# Patient Record
Sex: Male | Born: 1935
Health system: Southern US, Community
[De-identification: ages and names within clinical notes are randomized; demographics above are authoritative.]

## PROBLEM LIST (undated history)

## (undated) DIAGNOSIS — M5136 Other intervertebral disc degeneration, lumbar region: Secondary | ICD-10-CM

## (undated) DIAGNOSIS — M810 Age-related osteoporosis without current pathological fracture: Secondary | ICD-10-CM

## (undated) DIAGNOSIS — N189 Chronic kidney disease, unspecified: Secondary | ICD-10-CM

## (undated) DIAGNOSIS — I10 Essential (primary) hypertension: Secondary | ICD-10-CM

## (undated) DIAGNOSIS — D649 Anemia, unspecified: Secondary | ICD-10-CM

## (undated) DIAGNOSIS — M869 Osteomyelitis, unspecified: Secondary | ICD-10-CM

## (undated) DIAGNOSIS — I2510 Atherosclerotic heart disease of native coronary artery without angina pectoris: Secondary | ICD-10-CM

## (undated) DIAGNOSIS — D509 Iron deficiency anemia, unspecified: Secondary | ICD-10-CM

## (undated) DIAGNOSIS — M51369 Other intervertebral disc degeneration, lumbar region without mention of lumbar back pain or lower extremity pain: Secondary | ICD-10-CM

## (undated) DIAGNOSIS — J449 Chronic obstructive pulmonary disease, unspecified: Secondary | ICD-10-CM

## (undated) DIAGNOSIS — E213 Hyperparathyroidism, unspecified: Secondary | ICD-10-CM

## (undated) DIAGNOSIS — Z9989 Dependence on other enabling machines and devices: Secondary | ICD-10-CM

## (undated) DIAGNOSIS — E785 Hyperlipidemia, unspecified: Secondary | ICD-10-CM

## (undated) DIAGNOSIS — N400 Enlarged prostate without lower urinary tract symptoms: Secondary | ICD-10-CM

## (undated) DIAGNOSIS — G4733 Obstructive sleep apnea (adult) (pediatric): Secondary | ICD-10-CM

## (undated) DIAGNOSIS — G709 Myoneural disorder, unspecified: Secondary | ICD-10-CM

## (undated) HISTORY — PX: EYE SURGERY: SHX253

## (undated) HISTORY — DX: Chronic kidney disease, unspecified: N18.9

## (undated) HISTORY — PX: VASECTOMY: SHX75

## (undated) HISTORY — DX: Atherosclerotic heart disease of native coronary artery without angina pectoris: I25.10

## (undated) HISTORY — DX: Obstructive sleep apnea (adult) (pediatric): Z99.89

## (undated) HISTORY — DX: Iron deficiency anemia, unspecified: D50.9

## (undated) HISTORY — DX: Obstructive sleep apnea (adult) (pediatric): G47.33

## (undated) HISTORY — DX: Osteomyelitis, unspecified: M86.9

## (undated) HISTORY — DX: Hyperparathyroidism, unspecified: E21.3

## (undated) HISTORY — DX: Hyperlipidemia, unspecified: E78.5

## (undated) HISTORY — DX: Benign prostatic hyperplasia without lower urinary tract symptoms: N40.0

## (undated) HISTORY — DX: Chronic obstructive pulmonary disease, unspecified: J44.9

---

## 2000-12-24 ENCOUNTER — Encounter: Admission: RE | Admit: 2000-12-24 | Discharge: 2000-12-24 | Payer: Self-pay | Admitting: Endocrinology

## 2000-12-24 ENCOUNTER — Encounter: Payer: Self-pay | Admitting: Endocrinology

## 2001-12-13 ENCOUNTER — Emergency Department (HOSPITAL_COMMUNITY): Admission: EM | Admit: 2001-12-13 | Discharge: 2001-12-13 | Payer: Self-pay | Admitting: Emergency Medicine

## 2001-12-13 ENCOUNTER — Encounter: Payer: Self-pay | Admitting: Emergency Medicine

## 2004-11-20 ENCOUNTER — Emergency Department (HOSPITAL_COMMUNITY): Admission: EM | Admit: 2004-11-20 | Discharge: 2004-11-20 | Payer: Self-pay | Admitting: Emergency Medicine

## 2010-01-31 ENCOUNTER — Encounter: Admission: RE | Admit: 2010-01-31 | Discharge: 2010-01-31 | Payer: Self-pay | Admitting: Endocrinology

## 2010-03-01 ENCOUNTER — Encounter: Payer: Self-pay | Admitting: Pulmonary Disease

## 2010-03-16 ENCOUNTER — Ambulatory Visit (HOSPITAL_COMMUNITY): Admission: RE | Admit: 2010-03-16 | Discharge: 2010-03-16 | Payer: Self-pay | Admitting: Cardiology

## 2010-05-19 ENCOUNTER — Ambulatory Visit: Payer: Self-pay | Admitting: Pulmonary Disease

## 2010-05-19 DIAGNOSIS — I10 Essential (primary) hypertension: Secondary | ICD-10-CM | POA: Insufficient documentation

## 2010-05-19 DIAGNOSIS — G4733 Obstructive sleep apnea (adult) (pediatric): Secondary | ICD-10-CM | POA: Insufficient documentation

## 2010-05-28 ENCOUNTER — Ambulatory Visit (HOSPITAL_BASED_OUTPATIENT_CLINIC_OR_DEPARTMENT_OTHER)
Admission: RE | Admit: 2010-05-28 | Discharge: 2010-05-28 | Payer: Self-pay | Source: Home / Self Care | Attending: Pulmonary Disease | Admitting: Pulmonary Disease

## 2010-05-28 ENCOUNTER — Encounter: Payer: Self-pay | Admitting: Pulmonary Disease

## 2010-06-09 ENCOUNTER — Ambulatory Visit: Payer: Self-pay | Admitting: Pulmonary Disease

## 2010-06-13 ENCOUNTER — Telehealth: Payer: Self-pay | Admitting: Pulmonary Disease

## 2010-07-11 ENCOUNTER — Encounter: Payer: Self-pay | Admitting: Pulmonary Disease

## 2010-07-12 ENCOUNTER — Encounter: Payer: Self-pay | Admitting: Pulmonary Disease

## 2010-07-18 NOTE — Assessment & Plan Note (Signed)
Summary: Clayton [name]   Visit Type:  Initial Consult Copy to:  pcp Primary Provider/Referring Provider:  Dr. Elayne Snare  CC:  Snoring.  History of Present Illness: 77/M, hypertensive & diabetic  for evaluation of obstructive sleep apnea  He is an avid biker & has been tandem biking with his wife on a recumbent cycle x many yrs. At home, he sleeps in basement, wife upstairs c/o snoring when in [place], loud , awakens self sometimes, witnessed apneas. c/o dry mouth He sleeps on left side or back, x  1pillow, 2-3 awakenings, BR visits.He reports non refreshing sleep Epworth Sleepiness Score 13.  Preventive Screening-Counseling & Management  Alcohol-Tobacco     Alcohol drinks/day: occ     Smoking Status: quit     Packs/Day: 4.5     Year Quit: 1977   History of Present Illness: snore, wake up, dry mouth, stop breathing  What time do you typically go to bed?(between what hours): 10-11:30pm  How long does it take you to fall asleep? 1-3 months  How many times during the night do you wake up? 2-3  What time do you get out of bed to start your day? 4-6:45am  Do you drive or operate heavy machinery in your occupation? retired  How much has your weight changed (up or down) over the past two years? (in pounds): no  Have you ever had a sleep study before?  If yes,when and where: no  Do you currently use CPAP ? If so , at what pressure? no  Do you wear oxygen at any time? If yes, how many liters per minute? no Current Medications (verified): 1)  Metformin Hcl 500 Mg Tabs (Metformin Hcl) .... Once Daily At [facility] 2)  Glimepiride 2 Mg Tabs (Glimepiride) .... Once Noon 3)  Glimepiride 1 Mg Tabs (Glimepiride) .... Take 1 Tab By Mouth At Bedtime 4)  Indapamide 1.25 Mg Tabs (Indapamide) .... Take 1 Tab By Mouth At Bedtime 5)  Metformin Hcl 1000 Mg Tabs (Metformin Hcl) .... Take 1 Tab By Mouth At Bedtime 6)  Diltiazem Hcl 120 Mg Tabs (Diltiazem Hcl) .... Take 2 Tab By Mouth At Bedtime 7)   Ferrous Sulfate 325 (65 Fe) Mg Tabs (Ferrous Sulfate) .... Take 1 Tab By Mouth At Bedtime 8)  Bifera 28 Mg Tabs (Polysacch Fe Cmp-Fe Heme Poly) .... Take 1 Tab By Mouth At Bedtime 9)  Enablex 7.5 Mg Xr24h-Tab (Darifenacin Hydrobromide) .... Take 1 Tab By Mouth At Bedtime 10)  Finasteride 5 Mg Tabs (Finasteride) .... Take 1 Tab By Mouth At Bedtime 11)  Simvastatin 20 Mg Tabs (Simvastatin) .... Take 1 Tab By Mouth At Bedtime 12)  Actos 45 Mg Tabs (Pioglitazone Hcl) .... Take 1 Tab By Mouth At Bedtime 13)  Humalog 100 Unit/ml Soln (Insulin Lispro (Human)) .... 4 Units Three Times A Day 14)  Multivitamins   Tabs (Multiple Vitamin) .... Take 1 Tablet By Mouth Once A Day 15)  Colon Cleanser  Caps (Misc Natural Products) .... Take 1 Tablet By Mouth Every Morning 16)  Calcium-Vitamin D 600-200 Mg-Unit Tabs (Calcium-Vitamin D) .... Take 1 Tablet By Mouth Every Morning 17)  Acetaminophen 325 Mg Tabs (Acetaminophen) .... Take 1 Tablet By Mouth Every Morning  Allergies (verified): 1)  ! Pcn  Past History:  Social History: Last updated: 05/19/2010 Marital Status: Married, lives with wife Leslie Deschenes Children: Yes, 4 Occupation: Retired Patient states former smoker. (Quit 09-26-75)  Past Medical History: Diabetes SLEEP APNEA (ICD-780.57) HYPERTENSION (ICD-401.9)  Family History: none  Social History: Marital Status: Married, lives with wife Jarmall Santmyer Children: Yes, 4 Occupation: Retired Patient states former smoker. (Quit 09-26-75) Smoking Status:  quit Packs/Day:  4.5 Alcohol drinks/day:  occ  Review of Systems       The patient complains of shortness of breath with activity, loss of appetite, weight change, and hand/feet swelling.  The patient denies shortness of breath at rest, productive cough, non-productive cough, coughing up blood, chest pain, irregular heartbeats, acid heartburn, indigestion, abdominal pain, difficulty swallowing, sore throat, tooth/dental  problems, headaches, nasal congestion/difficulty breathing through nose, sneezing, itching, ear ache, anxiety, depression, joint stiffness or pain, rash, change in color of mucus, and fever.    Vital Signs:  Patient profile:   77 year old male Height:      70.5 inches Weight:      236 pounds BMI:     33.50 O2 Sat:      95 % on Room air Pulse rate:   70 / minute BP sitting:   138 / 68  (left arm) Cuff size:   regular  Vitals Entered By: Iran Planas CMA (May 19, 2010 2:57 PM)  O2 Flow:  Room air CC: Snoring Comments Medications reviewed with patient Verified contact number and pharmacy with patient Iran Planas CMA  May 19, 2010 2:58 PM    Physical Exam  Additional Exam:  Gen. Pleasant, well-nourished, in no distress, normal affect ENT - no lesions, no post nasal drip, class 2 airway Neck: No JVD, no thyromegaly, no carotid bruits Lungs: no use of accessory muscles, no dullness to percussion, clear without rales or rhonchi  Cardiovascular: Rhythm regular, heart sounds  normal, no murmurs or gallops, no peripheral edema Abdomen: soft and non-tender, no hepatosplenomegaly, BS normal. Musculoskeletal: No deformities, no cyanosis or clubbing Neuro:  alert, non focal     Impression & Recommendations:  Problem # 1:  SLEEP APNEA (ICD-780.57) The pathophysiology of obstructive sleep apnea, it's cardiovascular consequences and modes of treatment including CPAP were discussed with the patient in great detail.  For witnessed apneas, non refreshing sleep & loud snoring, PSG will be scheduled.  Orders: Sleep Disorder Referral (Sleep Disorder) Consultation Level III [id number]  Medications Added to Medication List This Visit: 1)  Metformin Hcl 500 Mg Tabs (Metformin hcl) .... Once daily at noon 2)  Glimepiride 2 Mg Tabs (Glimepiride) .... Once noon 3)  Glimepiride 1 Mg Tabs (Glimepiride) .... Take 1 tab by mouth at bedtime 4)  Indapamide 1.25 Mg Tabs (Indapamide) ....  Take 1 tab by mouth at bedtime 5)  Metformin Hcl 1000 Mg Tabs (Metformin hcl) .... Take 1 tab by mouth at bedtime 6)  Diltiazem Hcl 120 Mg Tabs (Diltiazem hcl) .... Take 2 tab by mouth at bedtime 7)  Ferrous Sulfate 325 (65 Fe) Mg Tabs (Ferrous sulfate) .... Take 1 tab by mouth at bedtime 8)  Bifera 28 Mg Tabs (Polysacch fe cmp-fe heme poly) .... Take 1 tab by mouth at bedtime 9)  Enablex 7.5 Mg Xr24h-tab (Darifenacin hydrobromide) .... Take 1 tab by mouth at bedtime 10)  Finasteride 5 Mg Tabs (Finasteride) .... Take 1 tab by mouth at bedtime 11)  Simvastatin 20 Mg Tabs (Simvastatin) .... Take 1 tab by mouth at bedtime 12)  Actos 45 Mg Tabs (Pioglitazone hcl) .... Take 1 tab by mouth at bedtime 13)  Humalog 100 Unit/ml Soln (Insulin lispro (human)) .... 4 units three times a day 14)  Multivitamins Tabs (Multiple  vitamin) .... Take 1 tablet by mouth once a day 15)  Colon Cleanser Caps (Misc natural products) .... Take 1 tablet by mouth every morning 16)  Calcium-vitamin D 600-200 Mg-unit Tabs (Calcium-vitamin d) .... Take 1 tablet by mouth every morning 17)  Acetaminophen 325 Mg Tabs (Acetaminophen) .... Take 1 tablet by mouth every morning  Patient Instructions: 1)  Copy sent to: dr Elayne Snare 2)  Please schedule a follow-up appointment in 2 weeks after sleep study

## 2010-07-18 NOTE — Letter (Signed)
Summary: Elayne Snare MD  Elayne Snare MD   Imported By: Bubba Hales 05/10/2010 07:40:23  _____________________________________________________________________  External Attachment:    Type:   Image     Comment:   External Document

## 2010-07-20 NOTE — Assessment & Plan Note (Addendum)
Summary: Clayton [name]   Visit Type:  Follow-up Copy to:  pcp Primary Provider/Referring Provider:  Dr. Elayne Snare  CC:  Spouse c/o pt snoring, kicking, apneas, and and hitting when sleep .  History of Present Illness: 77/M, hypertensive & diabetic  for evaluation of obstructive sleep apnea  He is an avid biker & has been tandem biking with his wife on a recumbent cycle x many yrs. At home, he sleeps in basement, wife upstairs . Hisotry confirmed from wife c/o snoring loud , awakens self sometimes, witnessed apneas. c/o dry mouth He sleeps on left side or back, x  1pillow, 2-3 awakenings, BR visits.He reports non refreshing sleep Epworth Sleepiness Score 13. PSg showed  Mild obstructive sleep apnea with hypopneas causing sleep  fragmentation and oxygen  desaturations were  especially severe during rapid eye movement sleep, predominantly  rapid eye movement related events.   2. Severe periodic limb movements however, very few of these were  associated with arousals.  The significance of this is unclear and  may be related to the sleep disordered breathing he isleaving for [place] x 2 months  Preventive Screening-Counseling & Management  Alcohol-Tobacco     Alcohol drinks/day: occ     Smoking Status: quit     Packs/Day: 4.5     Year Quit: 1977  Current Medications (verified): 1)  Metformin Hcl 500 Mg Tabs (Metformin Hcl) .... Once Daily At [facility] 2)  Glimepiride 2 Mg Tabs (Glimepiride) .... Once Noon 3)  Glimepiride 1 Mg Tabs (Glimepiride) .... Take 1 Tab By Mouth At Bedtime 4)  Indapamide 1.25 Mg Tabs (Indapamide) .... Take 1 Tab By Mouth At Bedtime 5)  Metformin Hcl 1000 Mg Tabs (Metformin Hcl) .... Take 1 Tab By Mouth At Bedtime 6)  Diltiazem Hcl 120 Mg Tabs (Diltiazem Hcl) .... Take 2 Tab By Mouth At Bedtime 7)  Ferrous Sulfate 325 (65 Fe) Mg Tabs (Ferrous Sulfate) .... Take 1 Tab By Mouth At Bedtime 8)  Bifera 28 Mg Tabs (Polysacch Fe Cmp-Fe Heme Poly) .... Take 1 Tab By Mouth At  Bedtime 9)  Enablex 7.5 Mg Xr24h-Tab (Darifenacin Hydrobromide) .... Take 1 Tab By Mouth At Bedtime 10)  Finasteride 5 Mg Tabs (Finasteride) .... Take 1 Tab By Mouth At Bedtime 11)  Simvastatin 20 Mg Tabs (Simvastatin) .... Take 1 Tab By Mouth At Bedtime 12)  Actos 45 Mg Tabs (Pioglitazone Hcl) .... Take 1 Tab By Mouth At Bedtime 13)  Humalog 100 Unit/ml Soln (Insulin Lispro (Human)) .... 4 Units Three Times A Day 14)  Multivitamins   Tabs (Multiple Vitamin) .... Take 1 Tablet By Mouth Once A Day 15)  Colon Cleanser  Caps (Misc Natural Products) .... Take 1 Tablet By Mouth Every Morning 16)  Calcium-Vitamin D 600-200 Mg-Unit Tabs (Calcium-Vitamin D) .... Take 1 Tablet By Mouth Every Morning 17)  Acetaminophen 325 Mg Tabs (Acetaminophen) .... Take 1 Tablet By Mouth Every Morning  Allergies (verified): 1)  ! Pcn  Past History:  Past Medical History: Last updated: 05/19/2010 Diabetes SLEEP APNEA (ICD-780.57) HYPERTENSION (ICD-401.9)    Social History: Last updated: 05/19/2010 Marital Status: Married, lives with wife Deaunte Rubinstein Children: Yes, 4 Occupation: Retired Patient states former smoker. (Quit 09-26-75)  Review of Systems  The patient denies anorexia, fever, weight loss, weight gain, vision loss, decreased hearing, hoarseness, chest pain, syncope, dyspnea on exertion, peripheral edema, prolonged cough, headaches, hemoptysis, abdominal pain, melena, hematochezia, severe indigestion/heartburn, hematuria, muscle weakness, suspicious skin lesions, difficulty walking, depression, unusual weight  change, abnormal bleeding, enlarged lymph nodes, and angioedema.    Vital Signs:  Patient profile:   77 year old male Height:      70.5 inches Weight:      237.8 pounds BMI:     33.76 O2 Sat:      96 % on Room air Temp:     97.4 degrees F oral Pulse rate:   73 / minute BP sitting:   128 / 60  (left arm) Cuff size:   large  Vitals Entered By: Iran Planas CMA (June 09, 2010 2:07 PM)  O2 Flow:  Room air CC: Spouse c/o pt snoring, kicking, apneas,  and hitting when sleep  Comments Medications reviewed with patient Verified contact number and pharmacy with patient Iran Planas CMA  June 09, 2010 2:08 PM    Physical Exam  Additional Exam:  Gen. Pleasant, well-nourished, in no distress, normal affect ENT - no lesions, no post nasal drip, class 2 airway Neck: No JVD, no thyromegaly, no carotid bruits Lungs: no use of accessory muscles, no dullness to percussion, clear without rales or rhonchi  Cardiovascular: Rhythm regular, heart sounds  normal, no murmurs or gallops, no peripheral edema Musculoskeletal: No deformities, no cyanosis or clubbing      Impression & Recommendations:  Problem # 1:  SLEEP APNEA (ICD-780.57) The pathophysiology of obstructive sleep apnea, it's cardiovascular consequences and modes of treatment including CPAP were discussed with the patient in great detail.  Will treat due to severity of desaturations event hough seems to be REM related predominantly Start autoCPAP 5-12 cm , common issues discussed - he leaves for [place] in a few days Orders: DME Referral (DME) Est. Patient Level III [id number])  Patient Instructions: 1)  Copy sent to: 2)  Please schedule a follow-up appointment in 2 months. 3)  We will set you up with an AUTO cpap machine- send in chip in 1 month 4)  Call us / supply company for problems  Appended Document: Clayton [name] reviewed download  12/27 -07/11/10 >>CPAP working well, no resiudal events on 12 cm avg, good usage, hope he is feelingbetter rested  Appended Document: Clayton [name] lmomtcb x 1  Appended Document: Clayton [name] Pt states he is sleeping longer, not snoring, unable to tell if is well rested. Feels like throat is drying out and is using cough drop before bed and when wakes to use the restroom.

## 2010-07-20 NOTE — Progress Notes (Signed)
Summary: home health-  Phone Note Call from Patient Call back at Home Phone [phone]   Caller: Patient Call For: Dr. Elsworth Soho Summary of Call: Patient phoned he stated that home health is suposed to come out today and they are leaving town tomorrow morning. He wanted to make sure that they were still coming today. Patient can be reached at [phone] or [phone]. He has called them but the give you 3 numbers to call and he isn't sure which one to call. Initial call taken by: Ozella Rocks,  June 13, 2010 10:15 AM  Follow-up for Phone Call        According to order it was faxed on friday to  [facility].  I called [facility] and they have no record of this order. icalled the pt to see if her requested it be sent to another company. He staets no he was told it was sent to [facility] and was told they would be out today to get him setup because he is leaving for [place] for the winter. I spoke to [place] and she is going to call [facility] and see what can be done.  Baileyton Bing CMA  June 13, 2010 12:16 PM  Called and spoke with [place] at [facility]. He will call patient now and work him in today for cpap set up. Order and sleep study faxed to [place] at the [facility] location at 3:30. Called and spoke with pt and he is aware of his appt for cpap set up. Phillips Grout  June 13, 2010 12:59 PM

## 2010-07-24 ENCOUNTER — Telehealth (INDEPENDENT_AMBULATORY_CARE_PROVIDER_SITE_OTHER): Payer: Self-pay | Admitting: *Deleted

## 2010-08-03 NOTE — Progress Notes (Addendum)
Summary: patient phoned stated that he was returning a call  Phone Note Call from Patient   Caller: Patient Call For: ALVA Summary of Call: Patient phoned stated that Dr. Elsworth Soho called and left a message for him to return the call last week. Patient can be reached at [phone] Initial call taken by: Ozella Rocks,  July 24, 2010 10:06 AM  Follow-up for Phone Call        Spoke with pt and advised of RA's recs per append regarding CPAP compliance.  Pt verbalized understanding and states that he is starting to feel somewhat better rested during the day.  Will call if has any issues.  Follow-up by: Tilden Dome,  July 24, 2010 11:10 AM     Appended Document: patient phoned stated that he was returning a call download 1/26-2/24/12 >> good usage, no residuals on avg 12 cm

## 2010-08-03 NOTE — Letter (Signed)
Summary: CMN for CPAP Supplies/[facility]  CMN for CPAP Supplies/[facility]   Imported By: Phillis Knack 07/25/2010 07:29:14  _____________________________________________________________________  External Attachment:    Type:   Image     Comment:   External Document

## 2010-08-18 ENCOUNTER — Encounter: Payer: Self-pay | Admitting: Pulmonary Disease

## 2010-08-29 NOTE — Letter (Signed)
Summary: Therapeutic History Report  Therapeutic History Report   Imported By: Jamelle Haring 08/25/2010 09:03:20  _____________________________________________________________________  External Attachment:    Type:   Image     Comment:   External Document

## 2010-09-01 ENCOUNTER — Encounter: Payer: Self-pay | Admitting: Pulmonary Disease

## 2010-09-01 ENCOUNTER — Ambulatory Visit (INDEPENDENT_AMBULATORY_CARE_PROVIDER_SITE_OTHER): Payer: Medicare Other | Admitting: Pulmonary Disease

## 2010-09-01 DIAGNOSIS — G4730 Sleep apnea, unspecified: Secondary | ICD-10-CM

## 2010-09-05 NOTE — Assessment & Plan Note (Signed)
Summary: OV IN MARCH//SH   Visit Type:  Follow-up Copy to:  pcp Primary Provider/Referring Provider:  Dr. Elayne Snare  CC:  Pt states is us8ng machine approx 5 hours a night.  History of Present Illness: 77/M, hypertensive & diabetic  for FU of obstructive sleep apnea  He is an avid biker & has been tandem biking with his wife on a recumbent cycle x many yrs. At home, he sleeps in basement, wife upstairs . Hisotry confirmed from wife c/o snoring loud , awakens self sometimes, witnessed apneas. c/o dry mouth He sleeps on left side or back, x  1pillow, 2-3 awakenings, BR visits.He reports non refreshing sleep Epworth Sleepiness Score 13. PSg showed  Mild obstructive sleep apnea with hypopneas causing sleep  fragmentation and oxygen  desaturations were  especially severe during rapid eye movement sleep, predominantly  rapid eye movement related events.   2. Severe periodic limb movements however, very few of these were  associated with arousals.  The significance of this is unclear and  may be related to the sleep disordered breathing  September 01, 2010   download  12/27 -07/11/10 >>CPAP working well, no resiudal events on 12 cm avg, good usage, reviewed download upto feb - controlled on 12 cm c/o dry mouth, pressure ok  Preventive Screening-Counseling & Management  Alcohol-Tobacco     Alcohol drinks/day: occ     Smoking Status: quit     Packs/Day: 4.5     Year Quit: 1977  Current Medications (verified): 1)  Metformin Hcl 500 Mg Tabs (Metformin Hcl) .... Once Daily At [facility] 2)  Glimepiride 2 Mg Tabs (Glimepiride) .... Once Noon 3)  Glimepiride 1 Mg Tabs (Glimepiride) .... Take 1 Tab By Mouth At Bedtime 4)  Indapamide 1.25 Mg Tabs (Indapamide) .... Take 1 Tab By Mouth At Bedtime 5)  Metformin Hcl 1000 Mg Tabs (Metformin Hcl) .... Take 1 Tab By Mouth At Bedtime 6)  Diltiazem Hcl 120 Mg Tabs (Diltiazem Hcl) .... Take 3 Tab By Mouth At Bedtime 7)  Ferrous Sulfate 325 (65 Fe) Mg Tabs  (Ferrous Sulfate) .... Take 1 Tab By Mouth Every Morning 8)  Bifera 28 Mg Tabs (Polysacch Fe Cmp-Fe Heme Poly) .... Take 1 Tab By Mouth in The Morning 9)  Enablex 7.5 Mg Xr24h-Tab (Darifenacin Hydrobromide) .... Take 1 Tab By Mouth At Bedtime 10)  Finasteride 5 Mg Tabs (Finasteride) .... Take 1 Tab By Mouth At Bedtime 11)  Simvastatin 20 Mg Tabs (Simvastatin) .... Take 1 Tab By Mouth At Bedtime 12)  Actos 45 Mg Tabs (Pioglitazone Hcl) .... Take One Tablet By Mouth Every Other Day 13)  Humalog 100 Unit/ml Soln (Insulin Lispro (Human)) .... 7 Units Three Times A Day (Unless Going To Gym Will Take 4 Units in The Mornings) 14)  Multivitamins   Tabs (Multiple Vitamin) .... Take 1 Tablet By Mouth Once A Day 15)  Colon Cleanser  Caps (Misc Natural Products) .... Take 1 Tablet By Mouth Every Morning and 3 Every Night 16)  Calcium-Vitamin D 600-200 Mg-Unit Tabs (Calcium-Vitamin D) .... Take 1 Tablet By Mouth Every Morning 17)  Acetaminophen 325 Mg Tabs (Acetaminophen) .... Take 2 Tablet By Mouth Two Times A Day  Allergies (verified): 1)  ! Pcn  Past History:  Past Medical History: Last updated: 05/19/2010 Diabetes SLEEP APNEA (ICD-780.57) HYPERTENSION (ICD-401.9)    Social History: Last updated: 05/19/2010 Marital Status: Married, lives with wife Temur Schu Children: Yes, 4 Occupation: Retired Patient states former smoker. (Quit 09-26-75)  Review of Systems  The patient denies anorexia, fever, weight loss, weight gain, vision loss, decreased hearing, hoarseness, chest pain, syncope, dyspnea on exertion, peripheral edema, prolonged cough, headaches, hemoptysis, abdominal pain, melena, hematochezia, severe indigestion/heartburn, muscle weakness, suspicious skin lesions, transient blindness, difficulty walking, depression, unusual weight change, abnormal bleeding, and enlarged lymph nodes.    Vital Signs:  Patient profile:   77 year old male Height:      70.5 inches Weight:       233.2 pounds BMI:     33.11 O2 Sat:      92 % on Room air Temp:     97.6 degrees F oral Pulse rate:   68 / minute BP sitting:   122 / 50  (left arm) Cuff size:   regular  Vitals Entered By: Iran Planas CMA (September 01, 2010 1:43 PM)  O2 Flow:  Room air CC: Pt states is us8ng machine approx 5 hours a night Comments Medications reviewed with patient Verified contact number and pharmacy with patient Iran Planas CMA  September 01, 2010 1:43 PM    Physical Exam  Additional Exam:  Gen. Pleasant, well-nourished, in no distress, normal affect ENT - no lesions, no post nasal drip, class 2 airway Neck: No JVD, no thyromegaly, no carotid bruits Lungs: no use of accessory muscles, no dullness to percussion, clear without rales or rhonchi  Cardiovascular: Rhythm regular, heart sounds  normal, no murmurs or gallops, no peripheral edema Musculoskeletal: No deformities, no cyanosis or clubbing      Impression & Recommendations:  Problem # 1:  SLEEP APNEA (ICD-780.57) change to fixed pressure 12 cm & rechk download on this setting Compliance encouraged, wt loss emphasized, asked to avoid meds with sedative side effects, cautioned against driving when sleepy.  He seems to have adjusted well. Orders: Est. Patient Level III [id number]) DME Referral (DME)  Medications Added to Medication List This Visit: 1)  Diltiazem Hcl 120 Mg Tabs (Diltiazem hcl) .... Take 3 tab by mouth at bedtime 2)  Ferrous Sulfate 325 (65 Fe) Mg Tabs (Ferrous sulfate) .... Take 1 tab by mouth every morning 3)  Bifera 28 Mg Tabs (Polysacch fe cmp-fe heme poly) .... Take 1 tab by mouth in the morning 4)  Actos 45 Mg Tabs (Pioglitazone hcl) .... Take one tablet by mouth every other day 5)  Humalog 100 Unit/ml Soln (Insulin lispro (human)) .... 7 units three times a day (unless going to gym will take 4 units in the mornings) 6)  Colon Cleanser Caps (Misc natural products) .... Take 1 tablet by mouth every morning and 3  every night 7)  Acetaminophen 325 Mg Tabs (Acetaminophen) .... Take 2 tablet by mouth two times a day  Patient Instructions: 1)  Copy sent to: 2)  Please schedule a follow-up appointment in 3 months. 3)  We will change to fixed pressure of 12 cm & chk another donwload 4)  You are doing well on CPAP

## 2010-09-13 ENCOUNTER — Telehealth: Payer: Self-pay | Admitting: Pulmonary Disease

## 2010-09-13 DIAGNOSIS — G4730 Sleep apnea, unspecified: Secondary | ICD-10-CM

## 2010-09-13 NOTE — Telephone Encounter (Signed)
Called, spoke with pt.  States he tried the nasal pillow last night but didn't like it.  States he put the mask back on before the end of the night.  Also, states pressure at 12 is too high.  Requesting Dr. Bari Mantis recs.  Pls advise.  Thanks!

## 2010-09-15 NOTE — Telephone Encounter (Signed)
Called and advised pt he can stay on the mask and he states he already went back to the mask. Pt also aware order sent to [facility] to have pt set on auto mode. Pt verbalized understanding  Will forward to [facility] so they are aware

## 2010-09-15 NOTE — Telephone Encounter (Signed)
Order given to [facility] to put pt back on auto cpap

## 2010-09-15 NOTE — Telephone Encounter (Signed)
Ok to stay on mask OK to go back to auto 8-12 cm as before - pl send order to [facility]

## 2011-08-28 ENCOUNTER — Encounter: Payer: Self-pay | Admitting: Pulmonary Disease

## 2011-12-19 ENCOUNTER — Other Ambulatory Visit: Payer: Self-pay | Admitting: Endocrinology

## 2011-12-19 MED ORDER — FERUMOXYTOL INJECTION 510 MG/17 ML: 510.0000 mg | Freq: Once | INTRAVENOUS | Status: DC

## 2011-12-27 ENCOUNTER — Encounter (HOSPITAL_COMMUNITY): Payer: Self-pay

## 2011-12-27 ENCOUNTER — Encounter (HOSPITAL_COMMUNITY)
Admission: RE | Admit: 2011-12-27 | Discharge: 2011-12-27 | Disposition: A | Discharge status: Home / Self Care | Payer: Medicare Other | Source: Ambulatory Visit | Attending: Endocrinology | Admitting: Endocrinology

## 2011-12-27 DIAGNOSIS — D509 Iron deficiency anemia, unspecified: Secondary | ICD-10-CM | POA: Insufficient documentation

## 2011-12-27 HISTORY — DX: Age-related osteoporosis without current pathological fracture: M81.0

## 2011-12-27 HISTORY — DX: Essential (primary) hypertension: I10

## 2011-12-27 HISTORY — DX: Anemia, unspecified: D64.9

## 2011-12-27 HISTORY — DX: Myoneural disorder, unspecified: G70.9

## 2011-12-27 HISTORY — DX: Chronic kidney disease, unspecified: N18.9

## 2011-12-27 MED ORDER — FERUMOXYTOL INJECTION 510 MG/17 ML
INTRAVENOUS | Status: AC
  Administered 2011-12-27: 510 mg via INTRAVENOUS
  Filled 2011-12-27: qty 17

## 2011-12-27 MED ORDER — FERUMOXYTOL INJECTION 510 MG/17 ML
510.0000 mg | Freq: Once | INTRAVENOUS | Status: AC
  Administered 2011-12-27: 510 mg via INTRAVENOUS

## 2011-12-27 MED ORDER — SODIUM CHLORIDE 0.9 % IV SOLN
INTRAVENOUS | Status: AC
  Administered 2011-12-27: 13:00:00 via INTRAVENOUS

## 2012-06-18 ENCOUNTER — Encounter (HOSPITAL_BASED_OUTPATIENT_CLINIC_OR_DEPARTMENT_OTHER): Payer: Self-pay | Admitting: *Deleted

## 2012-06-18 ENCOUNTER — Inpatient Hospital Stay (HOSPITAL_BASED_OUTPATIENT_CLINIC_OR_DEPARTMENT_OTHER)
Admission: EM | Admit: 2012-06-18 | Discharge: 2012-07-02 | DRG: 207 | Disposition: A | Discharge status: Skilled Nursing Facility | Payer: Medicare PPO | Attending: Internal Medicine | Admitting: Internal Medicine

## 2012-06-18 ENCOUNTER — Emergency Department (HOSPITAL_BASED_OUTPATIENT_CLINIC_OR_DEPARTMENT_OTHER): Payer: Medicare PPO

## 2012-06-18 DIAGNOSIS — Z87891 Personal history of nicotine dependence: Secondary | ICD-10-CM

## 2012-06-18 DIAGNOSIS — D539 Nutritional anemia, unspecified: Secondary | ICD-10-CM | POA: Diagnosis present

## 2012-06-18 DIAGNOSIS — R5381 Other malaise: Secondary | ICD-10-CM

## 2012-06-18 DIAGNOSIS — G9340 Encephalopathy, unspecified: Secondary | ICD-10-CM | POA: Diagnosis not present

## 2012-06-18 DIAGNOSIS — M810 Age-related osteoporosis without current pathological fracture: Secondary | ICD-10-CM | POA: Diagnosis present

## 2012-06-18 DIAGNOSIS — E785 Hyperlipidemia, unspecified: Secondary | ICD-10-CM | POA: Diagnosis present

## 2012-06-18 DIAGNOSIS — J40 Bronchitis, not specified as acute or chronic: Secondary | ICD-10-CM

## 2012-06-18 DIAGNOSIS — N400 Enlarged prostate without lower urinary tract symptoms: Secondary | ICD-10-CM | POA: Diagnosis present

## 2012-06-18 DIAGNOSIS — R197 Diarrhea, unspecified: Secondary | ICD-10-CM | POA: Diagnosis not present

## 2012-06-18 DIAGNOSIS — N183 Chronic kidney disease, stage 3 unspecified: Secondary | ICD-10-CM | POA: Diagnosis present

## 2012-06-18 DIAGNOSIS — Z23 Encounter for immunization: Secondary | ICD-10-CM

## 2012-06-18 DIAGNOSIS — M5137 Other intervertebral disc degeneration, lumbosacral region: Secondary | ICD-10-CM | POA: Diagnosis present

## 2012-06-18 DIAGNOSIS — Z79899 Other long term (current) drug therapy: Secondary | ICD-10-CM

## 2012-06-18 DIAGNOSIS — Z794 Long term (current) use of insulin: Secondary | ICD-10-CM

## 2012-06-18 DIAGNOSIS — G4730 Sleep apnea, unspecified: Secondary | ICD-10-CM

## 2012-06-18 DIAGNOSIS — Q619 Cystic kidney disease, unspecified: Secondary | ICD-10-CM

## 2012-06-18 DIAGNOSIS — R05 Cough: Secondary | ICD-10-CM

## 2012-06-18 DIAGNOSIS — E119 Type 2 diabetes mellitus without complications: Secondary | ICD-10-CM

## 2012-06-18 DIAGNOSIS — R0902 Hypoxemia: Secondary | ICD-10-CM

## 2012-06-18 DIAGNOSIS — J9600 Acute respiratory failure, unspecified whether with hypoxia or hypercapnia: Secondary | ICD-10-CM

## 2012-06-18 DIAGNOSIS — H9190 Unspecified hearing loss, unspecified ear: Secondary | ICD-10-CM

## 2012-06-18 DIAGNOSIS — I129 Hypertensive chronic kidney disease with stage 1 through stage 4 chronic kidney disease, or unspecified chronic kidney disease: Secondary | ICD-10-CM | POA: Diagnosis present

## 2012-06-18 DIAGNOSIS — M51379 Other intervertebral disc degeneration, lumbosacral region without mention of lumbar back pain or lower extremity pain: Secondary | ICD-10-CM | POA: Diagnosis present

## 2012-06-18 DIAGNOSIS — J1000 Influenza due to other identified influenza virus with unspecified type of pneumonia: Principal | ICD-10-CM

## 2012-06-18 DIAGNOSIS — C3490 Malignant neoplasm of unspecified part of unspecified bronchus or lung: Secondary | ICD-10-CM

## 2012-06-18 DIAGNOSIS — D72829 Elevated white blood cell count, unspecified: Secondary | ICD-10-CM | POA: Diagnosis present

## 2012-06-18 DIAGNOSIS — F411 Generalized anxiety disorder: Secondary | ICD-10-CM | POA: Diagnosis not present

## 2012-06-18 DIAGNOSIS — N189 Chronic kidney disease, unspecified: Secondary | ICD-10-CM

## 2012-06-18 DIAGNOSIS — E876 Hypokalemia: Secondary | ICD-10-CM | POA: Diagnosis not present

## 2012-06-18 DIAGNOSIS — J9601 Acute respiratory failure with hypoxia: Secondary | ICD-10-CM

## 2012-06-18 DIAGNOSIS — E871 Hypo-osmolality and hyponatremia: Secondary | ICD-10-CM | POA: Diagnosis not present

## 2012-06-18 DIAGNOSIS — R059 Cough, unspecified: Secondary | ICD-10-CM

## 2012-06-18 DIAGNOSIS — J15212 Pneumonia due to Methicillin resistant Staphylococcus aureus: Secondary | ICD-10-CM | POA: Diagnosis not present

## 2012-06-18 DIAGNOSIS — M940 Chondrocostal junction syndrome [Tietze]: Secondary | ICD-10-CM | POA: Diagnosis present

## 2012-06-18 DIAGNOSIS — I10 Essential (primary) hypertension: Secondary | ICD-10-CM

## 2012-06-18 HISTORY — DX: Other intervertebral disc degeneration, lumbar region without mention of lumbar back pain or lower extremity pain: M51.369

## 2012-06-18 HISTORY — DX: Other intervertebral disc degeneration, lumbar region: M51.36

## 2012-06-18 NOTE — ED Notes (Signed)
Cough since Sat. Low grade fever. +nausea. Decreased appetite. Took insulin. FSBS 249 at 9p

## 2012-06-18 NOTE — ED Notes (Signed)
Patient transported to X-ray 

## 2012-06-19 ENCOUNTER — Encounter (HOSPITAL_COMMUNITY): Payer: Self-pay | Admitting: *Deleted

## 2012-06-19 ENCOUNTER — Emergency Department (HOSPITAL_BASED_OUTPATIENT_CLINIC_OR_DEPARTMENT_OTHER): Payer: Medicare PPO

## 2012-06-19 DIAGNOSIS — R059 Cough, unspecified: Secondary | ICD-10-CM

## 2012-06-19 DIAGNOSIS — H9190 Unspecified hearing loss, unspecified ear: Secondary | ICD-10-CM | POA: Diagnosis present

## 2012-06-19 DIAGNOSIS — J40 Bronchitis, not specified as acute or chronic: Secondary | ICD-10-CM

## 2012-06-19 DIAGNOSIS — R05 Cough: Secondary | ICD-10-CM

## 2012-06-19 DIAGNOSIS — R0902 Hypoxemia: Secondary | ICD-10-CM | POA: Diagnosis present

## 2012-06-19 DIAGNOSIS — N183 Chronic kidney disease, stage 3 unspecified: Secondary | ICD-10-CM | POA: Diagnosis present

## 2012-06-19 DIAGNOSIS — E119 Type 2 diabetes mellitus without complications: Secondary | ICD-10-CM | POA: Diagnosis present

## 2012-06-19 LAB — CBC
HCT: 36.7 % — ABNORMAL LOW (ref 39.0–52.0)
MCH: 33.7 pg (ref 26.0–34.0)
MCHC: 33 g/dL (ref 30.0–36.0)
MCV: 102.2 fL — ABNORMAL HIGH (ref 78.0–100.0)
Platelets: 229 10*3/uL (ref 150–400)
RDW: 12.5 % (ref 11.5–15.5)

## 2012-06-19 LAB — CBC WITH DIFFERENTIAL/PLATELET
Eosinophils Absolute: 0 10*3/uL (ref 0.0–0.7)
Eosinophils Relative: 0 % (ref 0–5)
Hemoglobin: 11.7 g/dL — ABNORMAL LOW (ref 13.0–17.0)
Lymphocytes Relative: 6 % — ABNORMAL LOW (ref 12–46)
Lymphs Abs: 0.8 10*3/uL (ref 0.7–4.0)
MCH: 32.6 pg (ref 26.0–34.0)
MCV: 100.6 fL — ABNORMAL HIGH (ref 78.0–100.0)
Monocytes Relative: 9 % (ref 3–12)
Neutrophils Relative %: 86 % — ABNORMAL HIGH (ref 43–77)
RBC: 3.59 MIL/uL — ABNORMAL LOW (ref 4.22–5.81)
WBC: 13.8 10*3/uL — ABNORMAL HIGH (ref 4.0–10.5)

## 2012-06-19 LAB — D-DIMER, QUANTITATIVE
D-Dimer, Quant: 0.66 ug/mL-FEU — ABNORMAL HIGH (ref 0.00–0.48)
D-Dimer, Quant: 1.01 ug/mL-FEU — ABNORMAL HIGH (ref 0.00–0.48)

## 2012-06-19 LAB — GLUCOSE, CAPILLARY
Glucose-Capillary: 266 mg/dL — ABNORMAL HIGH (ref 70–99)
Glucose-Capillary: 340 mg/dL — ABNORMAL HIGH (ref 70–99)
Glucose-Capillary: 217 mg/dL — ABNORMAL HIGH (ref 70–99)
Glucose-Capillary: 222 mg/dL — ABNORMAL HIGH (ref 70–99)

## 2012-06-19 LAB — BASIC METABOLIC PANEL
BUN: 31 mg/dL — ABNORMAL HIGH (ref 6–23)
CO2: 26 mEq/L (ref 19–32)
CO2: 26 mEq/L (ref 19–32)
Calcium: 9.4 mg/dL (ref 8.4–10.5)
Chloride: 97 mEq/L (ref 96–112)
Creatinine, Ser: 1.6 mg/dL — ABNORMAL HIGH (ref 0.50–1.35)
Glucose, Bld: 276 mg/dL — ABNORMAL HIGH (ref 70–99)
Glucose, Bld: 362 mg/dL — ABNORMAL HIGH (ref 70–99)
Potassium: 3.6 mEq/L (ref 3.5–5.1)
Sodium: 137 mEq/L (ref 135–145)

## 2012-06-19 LAB — INFLUENZA PANEL BY PCR (TYPE A & B)
Influenza A By PCR: POSITIVE — AB
Influenza B By PCR: NEGATIVE

## 2012-06-19 LAB — DIFFERENTIAL
Basophils Absolute: 0 10*3/uL (ref 0.0–0.1)
Eosinophils Absolute: 0.2 10*3/uL (ref 0.0–0.7)
Eosinophils Relative: 2 % (ref 0–5)
Monocytes Absolute: 1.8 10*3/uL — ABNORMAL HIGH (ref 0.1–1.0)

## 2012-06-19 LAB — PRO B NATRIURETIC PEPTIDE: Pro B Natriuretic peptide (BNP): 1178 pg/mL — ABNORMAL HIGH (ref 0–450)

## 2012-06-19 MED ORDER — IPRATROPIUM BROMIDE 0.02 % IN SOLN
0.5000 mg | Freq: Once | RESPIRATORY_TRACT | Status: AC
  Administered 2012-06-19: 0.5 mg via RESPIRATORY_TRACT
  Filled 2012-06-19: qty 2.5

## 2012-06-19 MED ORDER — ENOXAPARIN SODIUM 40 MG/0.4ML ~~LOC~~ SOLN
40.0000 mg | SUBCUTANEOUS | Status: DC
  Filled 2012-06-19 (×3): qty 0.4

## 2012-06-19 MED ORDER — ALBUTEROL SULFATE (5 MG/ML) 0.5% IN NEBU
5.0000 mg | INHALATION_SOLUTION | Freq: Once | RESPIRATORY_TRACT | Status: AC
  Administered 2012-06-19: 5 mg via RESPIRATORY_TRACT
  Filled 2012-06-19 (×2): qty 0.5

## 2012-06-19 MED ORDER — SODIUM CHLORIDE 0.9 % IV SOLN
INTRAVENOUS | Status: DC
  Administered 2012-06-19: 75 mL/h via INTRAVENOUS
  Administered 2012-06-20: 1000 mL via INTRAVENOUS

## 2012-06-19 MED ORDER — LEVOFLOXACIN IN D5W 500 MG/100ML IV SOLN
500.0000 mg | Freq: Once | INTRAVENOUS | Status: AC
  Administered 2012-06-19: 500 mg via INTRAVENOUS
  Filled 2012-06-19: qty 100

## 2012-06-19 MED ORDER — SODIUM CHLORIDE 0.9 % IV BOLUS (SEPSIS)
500.0000 mL | Freq: Once | INTRAVENOUS | Status: AC
  Administered 2012-06-19: 500 mL via INTRAVENOUS

## 2012-06-19 MED ORDER — ONDANSETRON HCL 4 MG/2ML IJ SOLN: 4.0000 mg | Freq: Four times a day (QID) | INTRAMUSCULAR | Status: DC | PRN

## 2012-06-19 MED ORDER — IPRATROPIUM BROMIDE 0.02 % IN SOLN
0.5000 mg | RESPIRATORY_TRACT | Status: DC | PRN
  Administered 2012-06-19 – 2012-06-20 (×4): 0.5 mg via RESPIRATORY_TRACT
  Filled 2012-06-19 (×4): qty 2.5

## 2012-06-19 MED ORDER — ALUM & MAG HYDROXIDE-SIMETH 200-200-20 MG/5ML PO SUSP
30.0000 mL | Freq: Four times a day (QID) | ORAL | Status: DC | PRN
  Filled 2012-06-19: qty 30

## 2012-06-19 MED ORDER — FUROSEMIDE 10 MG/ML IJ SOLN
40.0000 mg | Freq: Once | INTRAMUSCULAR | Status: AC
  Administered 2012-06-19: 40 mg via INTRAVENOUS
  Filled 2012-06-19: qty 4

## 2012-06-19 MED ORDER — DILTIAZEM HCL ER BEADS 240 MG PO CP24
360.0000 mg | ORAL_CAPSULE | Freq: Every day | ORAL | Status: DC
  Administered 2012-06-19: 360 mg via ORAL
  Filled 2012-06-19 (×2): qty 1

## 2012-06-19 MED ORDER — INSULIN GLARGINE 100 UNIT/ML ~~LOC~~ SOLN
15.0000 [IU] | Freq: Every day | SUBCUTANEOUS | Status: DC
  Administered 2012-06-19: 15 [IU] via SUBCUTANEOUS

## 2012-06-19 MED ORDER — ACETAMINOPHEN 500 MG PO TABS
1000.0000 mg | ORAL_TABLET | Freq: Once | ORAL | Status: AC
  Administered 2012-06-19: 1000 mg via ORAL
  Filled 2012-06-19: qty 2

## 2012-06-19 MED ORDER — FINASTERIDE 5 MG PO TABS
5.0000 mg | ORAL_TABLET | Freq: Every day | ORAL | Status: DC
  Administered 2012-06-19: 5 mg via ORAL
  Filled 2012-06-19 (×2): qty 1

## 2012-06-19 MED ORDER — SENNOSIDES-DOCUSATE SODIUM 8.6-50 MG PO TABS
1.0000 | ORAL_TABLET | Freq: Every evening | ORAL | Status: DC | PRN
  Filled 2012-06-19: qty 1

## 2012-06-19 MED ORDER — AZITHROMYCIN 500 MG PO TABS
500.0000 mg | ORAL_TABLET | Freq: Every day | ORAL | Status: AC
  Administered 2012-06-19: 500 mg via ORAL
  Filled 2012-06-19: qty 1

## 2012-06-19 MED ORDER — ALENDRONATE SODIUM 70 MG PO TABS: 70.0000 mg | ORAL_TABLET | ORAL | Status: DC

## 2012-06-19 MED ORDER — AZITHROMYCIN 250 MG PO TABS
250.0000 mg | ORAL_TABLET | Freq: Every day | ORAL | Status: DC
  Filled 2012-06-19: qty 1

## 2012-06-19 MED ORDER — SODIUM CHLORIDE 0.9 % IJ SOLN
3.0000 mL | Freq: Two times a day (BID) | INTRAMUSCULAR | Status: DC
  Administered 2012-06-19 – 2012-06-20 (×3): 3 mL via INTRAVENOUS
  Administered 2012-06-21: 9 mL via INTRAVENOUS
  Administered 2012-06-21 – 2012-07-01 (×18): 3 mL via INTRAVENOUS

## 2012-06-19 MED ORDER — HYDROCODONE-ACETAMINOPHEN 5-325 MG PO TABS
1.0000 | ORAL_TABLET | ORAL | Status: DC | PRN
  Administered 2012-06-19 – 2012-06-20 (×4): 2 via ORAL
  Filled 2012-06-19 (×4): qty 2

## 2012-06-19 MED ORDER — FESOTERODINE FUMARATE ER 8 MG PO TB24: 8.0000 mg | ORAL_TABLET | Freq: Every day | ORAL | Status: DC

## 2012-06-19 MED ORDER — FENTANYL CITRATE 0.05 MG/ML IJ SOLN
50.0000 ug | Freq: Once | INTRAMUSCULAR | Status: AC
  Administered 2012-06-19: 50 ug via INTRAVENOUS
  Filled 2012-06-19: qty 2

## 2012-06-19 MED ORDER — ONDANSETRON HCL 4 MG PO TABS: 4.0000 mg | ORAL_TABLET | Freq: Four times a day (QID) | ORAL | Status: DC | PRN

## 2012-06-19 MED ORDER — IOHEXOL 350 MG/ML SOLN
80.0000 mL | Freq: Once | INTRAVENOUS | Status: AC | PRN
  Administered 2012-06-19: 80 mL via INTRAVENOUS

## 2012-06-19 MED ORDER — OSELTAMIVIR PHOSPHATE 75 MG PO CAPS
75.0000 mg | ORAL_CAPSULE | Freq: Two times a day (BID) | ORAL | Status: DC
  Administered 2012-06-19 (×2): 75 mg via ORAL
  Filled 2012-06-19 (×4): qty 1

## 2012-06-19 MED ORDER — BENZONATATE 100 MG PO CAPS
100.0000 mg | ORAL_CAPSULE | Freq: Three times a day (TID) | ORAL | Status: DC | PRN
  Filled 2012-06-19: qty 1

## 2012-06-19 MED ORDER — PNEUMOCOCCAL VAC POLYVALENT 25 MCG/0.5ML IJ INJ
0.5000 mL | INJECTION | INTRAMUSCULAR | Status: AC
  Administered 2012-06-20: 0.5 mL via INTRAMUSCULAR
  Filled 2012-06-19: qty 0.5

## 2012-06-19 MED ORDER — PIOGLITAZONE HCL 45 MG PO TABS
45.0000 mg | ORAL_TABLET | ORAL | Status: DC
  Administered 2012-06-19: 45 mg via ORAL
  Filled 2012-06-19 (×2): qty 1

## 2012-06-19 MED ORDER — ACETAMINOPHEN 650 MG RE SUPP: 650.0000 mg | Freq: Four times a day (QID) | RECTAL | Status: DC | PRN

## 2012-06-19 MED ORDER — INSULIN ASPART 100 UNIT/ML ~~LOC~~ SOLN
0.0000 [IU] | Freq: Every day | SUBCUTANEOUS | Status: DC
  Administered 2012-06-19: 2 [IU] via SUBCUTANEOUS

## 2012-06-19 MED ORDER — INDAPAMIDE 1.25 MG PO TABS
1.2500 mg | ORAL_TABLET | Freq: Every day | ORAL | Status: DC
  Administered 2012-06-19: 1.25 mg via ORAL
  Filled 2012-06-19 (×2): qty 1

## 2012-06-19 MED ORDER — SIMVASTATIN 20 MG PO TABS
20.0000 mg | ORAL_TABLET | Freq: Every day | ORAL | Status: DC
  Administered 2012-06-19: 20 mg via ORAL
  Filled 2012-06-19 (×2): qty 1

## 2012-06-19 MED ORDER — ZOLPIDEM TARTRATE 5 MG PO TABS
5.0000 mg | ORAL_TABLET | Freq: Every evening | ORAL | Status: DC | PRN
  Filled 2012-06-19: qty 1

## 2012-06-19 MED ORDER — ALBUTEROL SULFATE (5 MG/ML) 0.5% IN NEBU
2.5000 mg | INHALATION_SOLUTION | RESPIRATORY_TRACT | Status: DC | PRN
  Administered 2012-06-19 – 2012-06-20 (×4): 2.5 mg via RESPIRATORY_TRACT
  Filled 2012-06-19 (×4): qty 0.5

## 2012-06-19 MED ORDER — BIOTENE DRY MOUTH MT LIQD
15.0000 mL | Freq: Two times a day (BID) | OROMUCOSAL | Status: DC
  Administered 2012-06-19 (×2): 15 mL via OROMUCOSAL

## 2012-06-19 MED ORDER — ACETAMINOPHEN 325 MG PO TABS
650.0000 mg | ORAL_TABLET | Freq: Four times a day (QID) | ORAL | Status: DC | PRN
  Administered 2012-06-19: 650 mg via ORAL
  Filled 2012-06-19: qty 2

## 2012-06-19 MED ORDER — INSULIN ASPART 100 UNIT/ML ~~LOC~~ SOLN
0.0000 [IU] | Freq: Three times a day (TID) | SUBCUTANEOUS | Status: DC
  Administered 2012-06-19: 5 [IU] via SUBCUTANEOUS
  Administered 2012-06-19 (×2): 11 [IU] via SUBCUTANEOUS

## 2012-06-19 MED ORDER — LABETALOL HCL 5 MG/ML IV SOLN
5.0000 mg | INTRAVENOUS | Status: DC | PRN
  Administered 2012-06-21 – 2012-06-22 (×6): 5 mg via INTRAVENOUS
  Filled 2012-06-19 (×5): qty 4

## 2012-06-19 MED ORDER — GLIMEPIRIDE 4 MG PO TABS
4.0000 mg | ORAL_TABLET | Freq: Two times a day (BID) | ORAL | Status: DC
  Administered 2012-06-19 (×2): 4 mg via ORAL
  Filled 2012-06-19 (×5): qty 1

## 2012-06-19 NOTE — Progress Notes (Signed)
PHARMACIST - PHYSICIAN COMMUNICATION  CONCERNING: P&T Medication Policy Regarding Oral Bisphosphonates  RECOMMENDATION: Your order for alendronate (Fosamax), ibandronate (Boniva), or risedronate (Actonel) has been discontinued at this time.  If the patient's post-hospital medical condition warrants safe use of this class of drugs, please resume the pre-hospital regimen upon discharge.  DESCRIPTION:  Alendronate (Fosamax), ibandronate (Boniva), and risedronate (Actonel) can cause severe esophageal erosions in patients who are unable to remain upright at least 30 minutes after taking this medication.   Since brief interruptions in therapy are thought to have minimal impact on bone mineral density, the [place] has established that bisphosphonate orders should be routinely discontinued during hospitalization.   To override this safety policy and permit administration of Boniva, Fosamax, or Actonel in the hospital, prescribers must write "DO NOT HOLD" in the comments section when placing the order for this class of medications.  Alycia Rossetti, PharmD, BCPS Clinical Pharmacist 06/19/2012 6:00 AM

## 2012-06-19 NOTE — H&P (Signed)
PCP:   Elayne Snare   Chief Complaint:  Shortness of breath and chest pains  HPI: This is a 79 year old gentleman who drove to [place] and back with his daughters over the holiday, they returned home on the 29th. While in [place] he had positive sick contacts. Back home he has been coughing severely. It is a dry hacking cough. He's  had some nausea with the cough but no posttussive emesis. He's developed marked upper torso and generalized body aches. Today he went to dinner by his daughters, he felt feverish. He went home to lay down and then to call them stating he could not lay down because of the body aches and pain and severity of the coughing. He was wheezing and very raspy. He was short of breath. They took him to [facility] where he was evaluated and found to be somewhat hypoxic. The hospitalist here were called and requested to admit. During my interview patient states he already feels much better. History provided by the patient but also by his 2 daughters who are present at the bedside.   Review of Systems:  The patient denies anorexia, fever, weight loss,, vision loss, decreased hearing, hoarseness, syncope, dyspnea on exertion, peripheral edema, balance deficits, hemoptysis, abdominal pain, melena, hematochezia, severe indigestion/heartburn, hematuria, incontinence, genital sores, muscle weakness, suspicious skin lesions, transient blindness, difficulty walking, depression, unusual weight change, abnormal bleeding, enlarged lymph nodes, angioedema, and breast masses.  Past Medical History: Past Medical History  Diagnosis Date  . Diabetes mellitus   . Hypertension   . Anemia   . Chronic renal insufficiency   . Osteoporosis   . Neuromuscular disorder    Past Surgical History  Procedure Date  . Vasectomy   . Eye surgery     Medications: Prior to Admission medications   Medication Sig Start Date End Date Taking? Authorizing Provider  Acetaminophen (TYLENOL  ARTHRITIS PAIN PO) Take 2 tablets by mouth 2 (two) times daily.   Yes Historical Provider, MD  acetaminophen (TYLENOL) 325 MG tablet Take 650 mg by mouth every 6 (six) hours as needed.   Yes Historical Provider, MD  alendronate (FOSAMAX) 70 MG tablet Take 70 mg by mouth every 7 (seven) days. Take with a full glass of water on an empty stomach.   Yes Historical Provider, MD  diltiazem (TIAZAC) 360 MG 24 hr capsule Take 360 mg by mouth daily.   Yes Historical Provider, MD  fesoterodine (TOVIAZ) 8 MG TB24 Take 8 mg by mouth daily.   Yes Historical Provider, MD  finasteride (PROSCAR) 5 MG tablet Take 5 mg by mouth daily.   Yes Historical Provider, MD  furosemide (LASIX) 20 MG tablet Take 20 mg by mouth daily.   Yes Historical Provider, MD  glimepiride (AMARYL) 2 MG tablet Take 4 mg by mouth 2 (two) times daily.    Yes Historical Provider, MD  indapamide (LOZOL) 1.25 MG tablet Take 1.25 mg by mouth every morning.   Yes Historical Provider, MD  insulin glargine (LANTUS) 100 UNIT/ML injection Inject 15 Units into the skin at bedtime.   Yes Historical Provider, MD  insulin lispro (HUMALOG) 100 UNIT/ML injection Inject 15 Units into the skin 3 (three) times daily before meals.    Yes Historical Provider, MD  metFORMIN (GLUCOPHAGE) 500 MG tablet Take 500 mg by mouth daily with supper.   Yes Historical Provider, MD  pioglitazone (ACTOS) 45 MG tablet Take 45 mg by mouth every other day.   Yes Historical Provider,  MD  Polysacch Fe Cmp-Fe Heme Poly (BIFERA) 28 MG TABS Take by mouth.   Yes Historical Provider, MD  Pseudoephedrine-APAP-DM (DAYQUIL PO) Take by mouth.   Yes Historical Provider, MD  simvastatin (ZOCOR) 20 MG tablet Take 20 mg by mouth every evening.   Yes Historical Provider, MD  zolpidem (AMBIEN) 5 MG tablet Take 5 mg by mouth at bedtime as needed.   Yes Historical Provider, MD  ferumoxytol (FERAHEME) 510 MG/17ML SOLN Inject 17 mLs (510 mg total) into the vein once. 12/19/11   Elayne Snare, MD     Allergies:   Allergies  Allergen Reactions  . Penicillins     Social History:  reports that he quit smoking about 39 years ago. He quit smokeless tobacco use about 36 years ago. He reports that he does not drink alcohol or use illicit drugs.  Family History: Diabetes mellitus, hypertension  Physical Exam: Filed Vitals:   06/19/12 0142 06/19/12 0338 06/19/12 0345 06/19/12 0449  BP:  169/55  170/63  Pulse:  99  103  Temp:  98.4 F (36.9 C)  98.9 F (37.2 C)  TempSrc:      Resp:  29 21 24   Height:    5\' 11"  (1.803 m)  Weight:    109.7 kg (241 lb 13.5 oz)  SpO2: 94% 95%  93%    General:  Alert and oriented times three, well developed and nourished, no acute distress Eyes: PERRLA, pink conjunctiva, no scleral icterus ENT: Moist oral mucosa, neck supple, no thyromegaly Lungs: clear to ascultation, no wheeze, no crackles, no use of accessory muscles Cardiovascular: regular rate and rhythm, no regurgitation, no gallops, no murmurs. No carotid bruits, no JVD Abdomen: soft, positive BS, non-tender, non-distended, no organomegaly, not an acute abdomen GU: not examined Neuro: CN II - XII grossly intact, sensation intact Musculoskeletal: strength 5/5 all extremities, no clubbing, cyanosis or edema, some reproducible chest wall tenderness Skin: no rash, no subcutaneous crepitation, no decubitus Psych: appropriate patient   Labs on Admission:   Basename 06/19/12 0030  NA 137  K 3.6  CL 97  CO2 26  GLUCOSE 276*  BUN 31*  CREATININE 1.60*  CALCIUM 9.4  MG --  PHOS --   No results found for this basename: AST:2,ALT:2,ALKPHOS:2,BILITOT:2,PROT:2,ALBUMIN:2 in the last 72 hours No results found for this basename: LIPASE:2,AMYLASE:2 in the last 72 hours  Basename 06/19/12 0030  WBC 13.4*  NEUTROABS 10.9*  HGB 12.1*  HCT 36.7*  MCV 102.2*  PLT 229    Basename 06/19/12 0030  CKTOTAL --  CKMB --  CKMBINDEX --  TROPONINI <0.30   No components found with this  basename: POCBNP:3  Basename 06/19/12 0030  DDIMER 0.66*   No results found for this basename: HGBA1C:2 in the last 72 hours No results found for this basename: CHOL:2,HDL:2,LDLCALC:2,TRIG:2,CHOLHDL:2,LDLDIRECT:2 in the last 72 hours No results found for this basename: TSH,T4TOTAL,FREET3,T3FREE,THYROIDAB in the last 72 hours No results found for this basename: VITAMINB12:2,FOLATE:2,FERRITIN:2,TIBC:2,IRON:2,RETICCTPCT:2 in the last 72 hours  Micro Results: No results found for this or any previous visit (from the past 240 hour(s)).   Radiological Exams on Admission: Dg Chest 2 View  06/18/2012  *RADIOLOGY REPORT*  Clinical Data: Cough, congestion, shortness of breath, fever.  CHEST - 2 VIEW  Comparison: 03/12/2012  Findings: Lingular / left basilar density again noted, likely scarring.  No confluent opacity on the right.  Heart is upper limits normal in size.  No effusions.  No acute bony abnormality. Degenerative changes in the  thoracic spine.  IMPRESSION: Stable lingular / left basilar density, likely scarring.  No acute findings.   Original Report Authenticated By: Rolm Baptise, M.D.    Ct Angio Chest Pe W/cm &/or Wo Cm  06/19/2012  *RADIOLOGY REPORT*  Clinical Data: Shortness of breath and cough; congestion.  Fever. Elevated D-dimer.  CT ANGIOGRAPHY CHEST  Technique:  Multidetector CT imaging of the chest using the standard protocol during bolus administration of intravenous contrast. Multiplanar reconstructed images including MIPs were obtained and reviewed to evaluate the vascular anatomy.  Contrast: 2mL OMNIPAQUE IOHEXOL 350 MG/ML SOLN  Comparison: Chest radiograph performed 06/18/2012  Findings: There is no evidence of central pulmonary embolus; evaluation for pulmonary embolus is suboptimal due to motion artifact.  Minimal bibasilar atelectasis is noted.  A small lymph node is seen along the right minor fissure.  There is no evidence of significant focal consolidation, pleural effusion or  pneumothorax.  A likely 4 mm nodule is noted within the left lower lobe (image 59 of 94).  The mediastinum is unremarkable in appearance.  No mediastinal lymphadenopathy is seen.  No pericardial effusion is identified. The great vessels are grossly unremarkable in appearance.  Diffuse coronary artery calcifications are seen.  Soft tissue density at the posterior mediastinum is nonspecific and may reflect motion artifact; there is also unusual soft tissue density tracking about the bronchioles bilaterally.  No axillary lymphadenopathy is seen.  The visualized portions of the thyroid gland are unremarkable in appearance.  Nonspecific 2.2 cm soft tissue density is noted adjacent to the distal esophagus; the esophagus contains a small amount of fluid, likely transient in nature.  A calcified granuloma is noted within the left hepatic lobe.  The visualized portions of the liver and spleen are otherwise unremarkable.  No acute osseous abnormalities are seen.  IMPRESSION:  1.  No evidence of central pulmonary embolus. 2.  Minimal bibasilar atelectasis noted; lungs otherwise clear. 3.  Likely 4 mm nodule noted within the left lower lobe.  If the patient is at high risk for bronchogenic carcinoma, follow-up chest CT at 1 year is recommended.  If the patient is at low risk, no follow-up is needed.  This recommendation follows the consensus statement: Guidelines for Management of Small Pulmonary Nodules Detected on CT Scans:  A Statement from the [place] as published in Radiology 2005; 237:395-400. 4.  Diffuse coronary artery calcifications seen. 5.  Soft tissue density at the posterior mediastinum is nonspecific and may reflect motion artifact; unusual significant soft tissue density noted tracking about the bronchioles bilaterally, thought to reflect chronic lung disease.  No definite mediastinal lymphadenopathy seen. 6.  Nonspecific soft tissue density noted adjacent to the distal esophagus, possibly reflecting  scarring.   Original Report Authenticated By: Santa Lighter, M.D.     Assessment/Plan Present on Admission:  Bronchitis/costochondritis  Hypoxia Bring in for 23 hour observation Duo nebs, Tessalon Perles as needed  Oxygen to keep sats greater than 88%  rapid flu ordered, however, patient is outside of 48 hour window  Abnormal CT chest Family aware that repeat CT needed in 6 months or year to rule out growth of nodule/malignancy Diabetes  . HYPERTENSION Chronic kidney disease Hard of hearing Stable resume home medication ADA diet and sliding scale insulin  Full code DVT prophylaxis  Teressa Mcglocklin 06/19/2012, 6:44 AM

## 2012-06-19 NOTE — ED Notes (Signed)
I took CBG and got 266 mg./dcltr.

## 2012-06-19 NOTE — Progress Notes (Signed)
Pt requesting breathing treatment at this time; RT paged.

## 2012-06-19 NOTE — ED Provider Notes (Addendum)
History     CSN: [id number]  Arrival date & time 06/18/12  2151   First MD Initiated Contact with Patient 06/18/12 2355      Chief Complaint  Patient presents with  . Cough    (Consider location/radiation/quality/duration/timing/severity/associated sxs/prior treatment) Patient is a 79 y.o. male presenting with cough. The history is provided by the patient and a relative.  Cough This is a new problem. The current episode started more than 2 days ago. The problem occurs constantly. The problem has not changed since onset.The cough is non-productive. Maximum temperature: un measured. Associated symptoms include ear congestion. Pertinent negatives include no sweats and no myalgias. Associated symptoms comments: congestion. He has tried nothing for the symptoms. The treatment provided no relief. He is not a smoker. His past medical history does not include pneumonia.    Past Medical History  Diagnosis Date  . Diabetes mellitus   . Hypertension   . Anemia   . Chronic renal insufficiency   . Osteoporosis   . Neuromuscular disorder     Past Surgical History  Procedure Date  . Eye surgery   . Vasectomy     History reviewed. No pertinent family history.  History  Substance Use Topics  . Smoking status: Not on file  . Smokeless tobacco: Former Systems developer    Quit date: 09/26/1975  . Alcohol Use: No      Review of Systems  Constitutional: Positive for fever.  HENT: Negative for neck pain and neck stiffness.   Respiratory: Positive for cough.   Musculoskeletal: Negative for myalgias.  All other systems reviewed and are negative.    Allergies  Penicillins  Home Medications   Current Outpatient Rx  Name  Route  Sig  Dispense  Refill  . TYLENOL ARTHRITIS PAIN PO   Oral   Take 2 tablets by mouth 2 (two) times daily.         . ACETAMINOPHEN 325 MG PO TABS   Oral   Take 650 mg by mouth every 6 (six) hours as needed.         . ALENDRONATE SODIUM 70 MG PO TABS   Oral   Take 70 mg by mouth every 7 (seven) days. Take with a full glass of water on an empty stomach.         Marland Kitchen DILTIAZEM HCL ER BEADS 360 MG PO CP24   Oral   Take 360 mg by mouth daily.         . FESOTERODINE FUMARATE ER 8 MG PO TB24   Oral   Take 8 mg by mouth daily.         Marland Kitchen FINASTERIDE 5 MG PO TABS   Oral   Take 5 mg by mouth daily.         . FUROSEMIDE 20 MG PO TABS   Oral   Take 20 mg by mouth daily.         Marland Kitchen GLIMEPIRIDE 2 MG PO TABS   Oral   Take 4 mg by mouth 2 (two) times daily.          . INDAPAMIDE 1.25 MG PO TABS   Oral   Take 1.25 mg by mouth every morning.         . INSULIN GLARGINE 100 UNIT/ML [place] SOLN   Subcutaneous   Inject 15 Units into the skin at bedtime.         . INSULIN LISPRO (HUMAN) 100 UNIT/ML [place] SOLN   Subcutaneous  Inject 15 Units into the skin 3 (three) times daily before meals.          Marland Kitchen METFORMIN HCL 500 MG PO TABS   Oral   Take 500 mg by mouth daily with supper.         Marland Kitchen PIOGLITAZONE HCL 45 MG PO TABS   Oral   Take 45 mg by mouth every other day.         Marland Kitchen BIFERA 28 MG PO TABS   Oral   Take by mouth.         . DAYQUIL PO   Oral   Take by mouth.         Marland Kitchen SIMVASTATIN 20 MG PO TABS   Oral   Take 20 mg by mouth every evening.         Marland Kitchen ZOLPIDEM TARTRATE 5 MG PO TABS   Oral   Take 5 mg by mouth at bedtime as needed.         . FERUMOXYTOL INJECTION 510 MG/17 ML   Intravenous   Inject 17 mLs (510 mg total) into the vein once.   15.08 mL   1     BP 165/58  Pulse 102  Temp 98.6 F (37 C) (Oral)  Resp 22  Ht 5\' 11"  (1.803 m)  Wt 245 lb (111.131 kg)  BMI 34.17 kg/m2  SpO2 94%  Physical Exam  Constitutional: He is oriented to person, place, and time. He appears well-developed and well-nourished. No distress.  HENT:  Head: Normocephalic and atraumatic.  Mouth/Throat: Oropharynx is clear and moist.  Eyes: Conjunctivae normal are normal. Pupils are equal, round, and reactive to light.    Neck: Normal range of motion. Neck supple.  Cardiovascular: Normal rate, regular rhythm and intact distal pulses.   Pulmonary/Chest: Effort normal and breath sounds normal. No stridor. No respiratory distress. He has no wheezes. He has no rales.  Abdominal: Soft. Bowel sounds are normal. There is no tenderness. There is no rebound and no guarding.  Musculoskeletal: Normal range of motion. He exhibits no tenderness.  Lymphadenopathy:    He has no cervical adenopathy.  Neurological: He is alert and oriented to person, place, and time.  Skin: Skin is warm and dry. He is not diaphoretic.  Psychiatric: He has a normal mood and affect.    ED Course  Procedures (including critical care time)  Labs Reviewed  GLUCOSE, CAPILLARY - Abnormal; Notable for the following:    Glucose-Capillary 247 (*)     All other components within normal limits  CBC WITH DIFFERENTIAL  BASIC METABOLIC PANEL  D-DIMER, QUANTITATIVE  TROPONIN I   Dg Chest 2 View  06/18/2012  *RADIOLOGY REPORT*  Clinical Data: Cough, congestion, shortness of breath, fever.  CHEST - 2 VIEW  Comparison: 03/12/2012  Findings: Lingular / left basilar density again noted, likely scarring.  No confluent opacity on the right.  Heart is upper limits normal in size.  No effusions.  No acute bony abnormality. Degenerative changes in the thoracic spine.  IMPRESSION: Stable lingular / left basilar density, likely scarring.  No acute findings.   Original Report Authenticated By: Rolm Baptise, M.D.    Ct Angio Chest Pe W/cm &/or Wo Cm  06/19/2012  *RADIOLOGY REPORT*  Clinical Data: Shortness of breath and cough; congestion.  Fever. Elevated D-dimer.  CT ANGIOGRAPHY CHEST  Technique:  Multidetector CT imaging of the chest using the standard protocol during bolus administration of intravenous contrast. Multiplanar reconstructed images including MIPs  were obtained and reviewed to evaluate the vascular anatomy.  Contrast: 63mL OMNIPAQUE IOHEXOL 350 MG/ML SOLN   Comparison: Chest radiograph performed 06/18/2012  Findings: There is no evidence of central pulmonary embolus; evaluation for pulmonary embolus is suboptimal due to motion artifact.  Minimal bibasilar atelectasis is noted.  A small lymph node is seen along the right minor fissure.  There is no evidence of significant focal consolidation, pleural effusion or pneumothorax.  A likely 4 mm nodule is noted within the left lower lobe (image 59 of 94).  The mediastinum is unremarkable in appearance.  No mediastinal lymphadenopathy is seen.  No pericardial effusion is identified. The great vessels are grossly unremarkable in appearance.  Diffuse coronary artery calcifications are seen.  Soft tissue density at the posterior mediastinum is nonspecific and may reflect motion artifact; there is also unusual soft tissue density tracking about the bronchioles bilaterally.  No axillary lymphadenopathy is seen.  The visualized portions of the thyroid gland are unremarkable in appearance.  Nonspecific 2.2 cm soft tissue density is noted adjacent to the distal esophagus; the esophagus contains a small amount of fluid, likely transient in nature.  A calcified granuloma is noted within the left hepatic lobe.  The visualized portions of the liver and spleen are otherwise unremarkable.  No acute osseous abnormalities are seen.  IMPRESSION:  1.  No evidence of central pulmonary embolus. 2.  Minimal bibasilar atelectasis noted; lungs otherwise clear. 3.  Likely 4 mm nodule noted within the left lower lobe.  If the patient is at high risk for bronchogenic carcinoma, follow-up chest CT at 1 year is recommended.  If the patient is at low risk, no follow-up is needed.  This recommendation follows the consensus statement: Guidelines for Management of Small Pulmonary Nodules Detected on CT Scans:  A Statement from the [place] as published in Radiology 2005; 237:395-400. 4.  Diffuse coronary artery calcifications seen. 5.  Soft  tissue density at the posterior mediastinum is nonspecific and may reflect motion artifact; unusual significant soft tissue density noted tracking about the bronchioles bilaterally, thought to reflect chronic lung disease.  No definite mediastinal lymphadenopathy seen. 6.  Nonspecific soft tissue density noted adjacent to the distal esophagus, possibly reflecting scarring.   Original Report Authenticated By: Santa Lighter, M.D.      No diagnosis found.    MDM   Date: 06/19/2012  Rate: 92  Rhythm: normal sinus rhythm  QRS Axis: normal  Intervals: PR prolonged  ST/T Wave abnormalities: normal  Conduction Disutrbances:first-degree A-V block   Narrative Interpretation:   Old EKG Reviewed: unchanged   Patient O2 sat low with h/o of recent trip to [place] via care ddimer ordered and was positive.  Initally clear on exam O2 sats began declining prior to angio and patient evaluated and had rales R> L base.  No CHF seen on CT scan.    Suspect patient developing pulmonary infections.  Patient and family also informed of need to have Dr. Dwyane Dee schedule patient for outpatient chest CT scan to evaluate nodule seen on angio.  Patient and family verbalize understanding and agree to follow up      Jamier Urbas K Sebastin Perlmutter-Rasch, MD 06/19/12 [id number]  Carlisle Beers, MD 06/19/12 [phone]

## 2012-06-19 NOTE — Progress Notes (Signed)
Inpatient Diabetes Program Recommendations  AACE/ADA: New Consensus Statement on Inpatient Glycemic Control (2013)  Target Ranges:  Prepandial:   less than 140 mg/dL      Peak postprandial:   less than 180 mg/dL (1-2 hours)      Critically ill patients:  140 - 180 mg/dL   Inpatient Diabetes Program Recommendations Correction (SSI): Increase to RESISTANT scale per Glycemic Control Order-set Thank you  Raoul Pitch [facility] Inpatient Diabetes Coordinator [phone]

## 2012-06-19 NOTE — ED Notes (Signed)
Patient reports that he is unable to lie in bed; patient is seated in the chair.  Family at bedside.

## 2012-06-19 NOTE — Progress Notes (Signed)
Patient seen and examined. Admitted overnight with cough, fever and body aches. Feels worse today. CXR and CT Chest are not indicative of infection. He does have a wet cough, altho no crackles on lung exam. I will go ahead and empirically start him on a z-pak and tamiflu as my suspicion for the flu is quite high. Will continue to follow. Anticipate DC home in 1-2 days.  Domingo Mend, MD [facility] Pager: [phone]

## 2012-06-20 ENCOUNTER — Encounter (HOSPITAL_COMMUNITY): Payer: Self-pay | Admitting: Internal Medicine

## 2012-06-20 ENCOUNTER — Inpatient Hospital Stay (HOSPITAL_COMMUNITY): Payer: Medicare PPO

## 2012-06-20 ENCOUNTER — Observation Stay (HOSPITAL_COMMUNITY): Payer: Medicare PPO

## 2012-06-20 DIAGNOSIS — J40 Bronchitis, not specified as acute or chronic: Secondary | ICD-10-CM

## 2012-06-20 DIAGNOSIS — R079 Chest pain, unspecified: Secondary | ICD-10-CM

## 2012-06-20 DIAGNOSIS — N189 Chronic kidney disease, unspecified: Secondary | ICD-10-CM

## 2012-06-20 DIAGNOSIS — J9600 Acute respiratory failure, unspecified whether with hypoxia or hypercapnia: Secondary | ICD-10-CM

## 2012-06-20 DIAGNOSIS — R0902 Hypoxemia: Secondary | ICD-10-CM

## 2012-06-20 DIAGNOSIS — E119 Type 2 diabetes mellitus without complications: Secondary | ICD-10-CM

## 2012-06-20 DIAGNOSIS — J1000 Influenza due to other identified influenza virus with unspecified type of pneumonia: Secondary | ICD-10-CM | POA: Diagnosis present

## 2012-06-20 LAB — URINALYSIS, ROUTINE W REFLEX MICROSCOPIC
Ketones, ur: NEGATIVE mg/dL
Nitrite: NEGATIVE
Protein, ur: 100 mg/dL — AB

## 2012-06-20 LAB — POCT I-STAT 3, ART BLOOD GAS (G3+)
Acid-Base Excess: 1 mmol/L (ref 0.0–2.0)
Acid-Base Excess: 1 mmol/L (ref 0.0–2.0)
Acid-base deficit: 3 mmol/L — ABNORMAL HIGH (ref 0.0–2.0)
Bicarbonate: 26.6 mEq/L — ABNORMAL HIGH (ref 20.0–24.0)
O2 Saturation: 100 %
O2 Saturation: 98 %
Patient temperature: 98
Patient temperature: 98.7
TCO2: 28 mmol/L (ref 0–100)
pO2, Arterial: 413 mmHg — ABNORMAL HIGH (ref 80.0–100.0)
pO2, Arterial: 99 mmHg (ref 80.0–100.0)
pO2, Arterial: 108 mmHg — ABNORMAL HIGH (ref 80.0–100.0)

## 2012-06-20 LAB — IRON AND TIBC
Saturation Ratios: 4 % — ABNORMAL LOW (ref 20–55)
TIBC: 256 ug/dL (ref 215–435)

## 2012-06-20 LAB — HEMOGLOBIN A1C: Hgb A1c MFr Bld: 7.4 % — ABNORMAL HIGH (ref <5.7)

## 2012-06-20 LAB — STREP PNEUMONIAE URINARY ANTIGEN: Strep Pneumo Urinary Antigen: NEGATIVE

## 2012-06-20 LAB — FOLATE: Folate: 18.2 ng/mL

## 2012-06-20 LAB — CBC WITH DIFFERENTIAL/PLATELET
Basophils Relative: 0 % (ref 0–1)
Eosinophils Relative: 0 % (ref 0–5)
Hemoglobin: 11.2 g/dL — ABNORMAL LOW (ref 13.0–17.0)
Lymphs Abs: 0.4 10*3/uL — ABNORMAL LOW (ref 0.7–4.0)
MCH: 33 pg (ref 26.0–34.0)
MCV: 100.9 fL — ABNORMAL HIGH (ref 78.0–100.0)
Monocytes Absolute: 1.8 10*3/uL — ABNORMAL HIGH (ref 0.1–1.0)
Platelets: 221 10*3/uL (ref 150–400)
RBC: 3.39 MIL/uL — ABNORMAL LOW (ref 4.22–5.81)

## 2012-06-20 LAB — RENAL FUNCTION PANEL
BUN: 45 mg/dL — ABNORMAL HIGH (ref 6–23)
Chloride: 92 mEq/L — ABNORMAL LOW (ref 96–112)
Glucose, Bld: 313 mg/dL — ABNORMAL HIGH (ref 70–99)
Potassium: 4.9 mEq/L (ref 3.5–5.1)

## 2012-06-20 LAB — GLUCOSE, CAPILLARY

## 2012-06-20 LAB — POCT I-STAT 3, VENOUS BLOOD GAS (G3P V)
Bicarbonate: 19.2 mEq/L — ABNORMAL LOW (ref 20.0–24.0)
Patient temperature: 98.9
pH, Ven: 7.238 — ABNORMAL LOW (ref 7.250–7.300)

## 2012-06-20 LAB — VITAMIN B12: Vitamin B-12: 1074 pg/mL — ABNORMAL HIGH (ref 211–911)

## 2012-06-20 LAB — URINE MICROSCOPIC-ADD ON

## 2012-06-20 MED ORDER — OSELTAMIVIR PHOSPHATE 6 MG/ML PO SUSR
150.0000 mg | Freq: Two times a day (BID) | ORAL | Status: DC
  Filled 2012-06-20 (×2): qty 25

## 2012-06-20 MED ORDER — FUROSEMIDE 10 MG/ML IJ SOLN
40.0000 mg | Freq: Once | INTRAMUSCULAR | Status: AC
  Administered 2012-06-20: 40 mg via INTRAVENOUS
  Filled 2012-06-20: qty 4

## 2012-06-20 MED ORDER — HEPARIN SODIUM (PORCINE) 5000 UNIT/ML IJ SOLN
5000.0000 [IU] | Freq: Three times a day (TID) | INTRAMUSCULAR | Status: DC
  Administered 2012-06-20 – 2012-07-02 (×36): 5000 [IU] via SUBCUTANEOUS
  Filled 2012-06-20 (×41): qty 1

## 2012-06-20 MED ORDER — SODIUM CHLORIDE 0.9 % IV BOLUS (SEPSIS)
1000.0000 mL | Freq: Once | INTRAVENOUS | Status: AC
  Administered 2012-06-20: 1000 mL via INTRAVENOUS

## 2012-06-20 MED ORDER — IPRATROPIUM BROMIDE 0.02 % IN SOLN: 0.5000 mg | RESPIRATORY_TRACT | Status: DC | PRN

## 2012-06-20 MED ORDER — OSELTAMIVIR PHOSPHATE 6 MG/ML PO SUSR
150.0000 mg | Freq: Every day | ORAL | Status: DC
  Administered 2012-06-20 – 2012-06-22 (×3): 150 mg
  Filled 2012-06-20 (×3): qty 25

## 2012-06-20 MED ORDER — FENTANYL CITRATE 0.05 MG/ML IJ SOLN
INTRAMUSCULAR | Status: AC
  Administered 2012-06-20: 200 ug via INTRAVENOUS
  Filled 2012-06-20: qty 4

## 2012-06-20 MED ORDER — FAMOTIDINE IN NACL 20-0.9 MG/50ML-% IV SOLN
20.0000 mg | INTRAVENOUS | Status: DC
  Administered 2012-06-21 – 2012-06-27 (×7): 20 mg via INTRAVENOUS
  Filled 2012-06-20 (×7): qty 50

## 2012-06-20 MED ORDER — VANCOMYCIN HCL 10 G IV SOLR
1500.0000 mg | INTRAVENOUS | Status: DC
  Administered 2012-06-21 – 2012-06-23 (×3): 1500 mg via INTRAVENOUS
  Filled 2012-06-20 (×3): qty 1500

## 2012-06-20 MED ORDER — BIOTENE DRY MOUTH MT LIQD
15.0000 mL | Freq: Four times a day (QID) | OROMUCOSAL | Status: DC
  Administered 2012-06-20 – 2012-06-27 (×30): 15 mL via OROMUCOSAL

## 2012-06-20 MED ORDER — CHLORHEXIDINE GLUCONATE CLOTH 2 % EX PADS
6.0000 | MEDICATED_PAD | Freq: Every day | CUTANEOUS | Status: AC
  Administered 2012-06-20 – 2012-06-24 (×5): 6 via TOPICAL

## 2012-06-20 MED ORDER — SODIUM CHLORIDE 0.9 % IV SOLN
INTRAVENOUS | Status: DC
  Administered 2012-06-20 – 2012-06-22 (×3): via INTRAVENOUS

## 2012-06-20 MED ORDER — LEVOFLOXACIN IN D5W 750 MG/150ML IV SOLN
750.0000 mg | INTRAVENOUS | Status: DC
  Administered 2012-06-20 – 2012-06-22 (×2): 750 mg via INTRAVENOUS
  Filled 2012-06-20 (×2): qty 150

## 2012-06-20 MED ORDER — MIDAZOLAM HCL 2 MG/2ML IJ SOLN
1.0000 mg | INTRAMUSCULAR | Status: DC | PRN
  Administered 2012-06-20 – 2012-06-21 (×4): 2 mg via INTRAVENOUS
  Administered 2012-06-21: 1 mg via INTRAVENOUS
  Administered 2012-06-22 – 2012-06-25 (×13): 2 mg via INTRAVENOUS
  Filled 2012-06-20 (×21): qty 2

## 2012-06-20 MED ORDER — LORAZEPAM 2 MG/ML IJ SOLN
2.0000 mg | Freq: Once | INTRAMUSCULAR | Status: AC
  Administered 2012-06-20: 2 mg via INTRAVENOUS

## 2012-06-20 MED ORDER — LORAZEPAM 2 MG/ML IJ SOLN
0.5000 mg | Freq: Once | INTRAMUSCULAR | Status: AC
  Administered 2012-06-20: 0.5 mg via INTRAVENOUS
  Filled 2012-06-20: qty 1

## 2012-06-20 MED ORDER — LORAZEPAM 2 MG/ML IJ SOLN
INTRAMUSCULAR | Status: AC
  Administered 2012-06-20: 2 mg via INTRAVENOUS
  Filled 2012-06-20: qty 3

## 2012-06-20 MED ORDER — OSELTAMIVIR PHOSPHATE 6 MG/ML PO SUSR
75.0000 mg | Freq: Two times a day (BID) | ORAL | Status: DC
  Filled 2012-06-20 (×2): qty 12.5

## 2012-06-20 MED ORDER — NOREPINEPHRINE BITARTRATE 1 MG/ML IJ SOLN
2.0000 ug/min | INTRAVENOUS | Status: DC
  Filled 2012-06-20: qty 16

## 2012-06-20 MED ORDER — FENTANYL CITRATE 0.05 MG/ML IJ SOLN
200.0000 ug | Freq: Once | INTRAMUSCULAR | Status: AC
  Administered 2012-06-20: 200 ug via INTRAVENOUS

## 2012-06-20 MED ORDER — FENTANYL CITRATE 0.05 MG/ML IJ SOLN
25.0000 ug | INTRAMUSCULAR | Status: DC | PRN
  Administered 2012-06-20 – 2012-06-27 (×27): 50 ug via INTRAVENOUS
  Filled 2012-06-20 (×32): qty 2

## 2012-06-20 MED ORDER — OXEPA PO LIQD
1000.0000 mL | ORAL | Status: DC
  Administered 2012-06-20 – 2012-06-22 (×3): 1000 mL
  Filled 2012-06-20 (×8): qty 1000

## 2012-06-20 MED ORDER — LORAZEPAM 2 MG/ML IJ SOLN
0.5000 mg | Freq: Four times a day (QID) | INTRAMUSCULAR | Status: DC | PRN
  Administered 2012-06-22 – 2012-06-27 (×4): 0.5 mg via INTRAVENOUS
  Filled 2012-06-20 (×4): qty 1

## 2012-06-20 MED ORDER — PRO-STAT SUGAR FREE PO LIQD
60.0000 mL | Freq: Three times a day (TID) | ORAL | Status: DC
  Administered 2012-06-20 – 2012-06-23 (×10): 60 mL
  Filled 2012-06-20 (×12): qty 60

## 2012-06-20 MED ORDER — MUPIROCIN 2 % EX OINT
1.0000 "application " | TOPICAL_OINTMENT | Freq: Two times a day (BID) | CUTANEOUS | Status: AC
  Administered 2012-06-20 – 2012-06-24 (×10): 1 via NASAL
  Filled 2012-06-20: qty 22

## 2012-06-20 MED ORDER — SODIUM CHLORIDE 0.9 % IV SOLN
INTRAVENOUS | Status: AC
  Administered 2012-06-20: 2.9 [IU]/h via INTRAVENOUS
  Administered 2012-06-21: 4 [IU]/h via INTRAVENOUS
  Filled 2012-06-20 (×2): qty 1

## 2012-06-20 MED ORDER — DEXTROSE 5 % IV SOLN
1.0000 g | INTRAVENOUS | Status: DC
  Administered 2012-06-20 – 2012-06-22 (×3): 1 g via INTRAVENOUS
  Filled 2012-06-20 (×4): qty 10

## 2012-06-20 MED ORDER — ALBUTEROL SULFATE (5 MG/ML) 0.5% IN NEBU
2.5000 mg | INHALATION_SOLUTION | RESPIRATORY_TRACT | Status: DC
  Administered 2012-06-20 – 2012-06-23 (×19): 2.5 mg via RESPIRATORY_TRACT
  Filled 2012-06-20 (×15): qty 0.5
  Filled 2012-06-20: qty 1.5
  Filled 2012-06-20: qty 0.5

## 2012-06-20 MED ORDER — VANCOMYCIN HCL 10 G IV SOLR
2500.0000 mg | Freq: Once | INTRAVENOUS | Status: AC
  Administered 2012-06-20: 2500 mg via INTRAVENOUS
  Filled 2012-06-20: qty 2500

## 2012-06-20 MED ORDER — FAMOTIDINE IN NACL 20-0.9 MG/50ML-% IV SOLN
20.0000 mg | Freq: Two times a day (BID) | INTRAVENOUS | Status: DC
  Administered 2012-06-20: 20 mg via INTRAVENOUS
  Filled 2012-06-20 (×3): qty 50

## 2012-06-20 MED ORDER — CHLORHEXIDINE GLUCONATE 0.12 % MT SOLN
15.0000 mL | Freq: Two times a day (BID) | OROMUCOSAL | Status: DC
  Administered 2012-06-20 – 2012-06-27 (×15): 15 mL via OROMUCOSAL
  Filled 2012-06-20 (×18): qty 15

## 2012-06-20 MED ORDER — INSULIN ASPART 100 UNIT/ML ~~LOC~~ SOLN
2.0000 [IU] | SUBCUTANEOUS | Status: DC
Start: 2012-06-20 — End: 2012-06-20

## 2012-06-20 NOTE — Progress Notes (Signed)
ANTIBIOTIC CONSULT NOTE - INITIAL  Pharmacy Consult for Vancocin and Rocephin Indication: severe CAP  Allergies  Allergen Reactions  . Penicillins     Patient Measurements: Height: 6' (182.9 cm) Weight: 244 lb 11.4 oz (111 kg) IBW/kg (Calculated) : 77.6   Vital Signs: Temp: 97.4 F (36.3 C) (01/03 0600) Temp src: Core (Comment) (01/03 0600) BP: 117/78 mmHg (01/03 0600) Pulse Rate: 91  (01/03 0600)  Labs:  Basename 06/19/12 0610 06/19/12 0030  WBC 13.8* 13.4*  HGB 11.7* 12.1*  PLT 225 229  LABCREA -- --  CREATININE 1.47* 1.60*   Estimated Creatinine Clearance: 49.9 ml/min (by C-G formula based on Cr of 1.47).   Microbiology: No results found for this or any previous visit (from the past 720 hour(s)).  Medical History: Past Medical History  Diagnosis Date  . Diabetes mellitus   . Hypertension   . Anemia   . Chronic renal insufficiency   . Osteoporosis   . Neuromuscular disorder   . DDD (degenerative disc disease), lumbar     Medications:  Prescriptions prior to admission  Medication Sig Dispense Refill  . Acetaminophen (TYLENOL ARTHRITIS PAIN PO) Take 2 tablets by mouth 2 (two) times daily.      Marland Kitchen acetaminophen (TYLENOL) 325 MG tablet Take 650 mg by mouth every 6 (six) hours as needed. For pain      . alendronate (FOSAMAX) 70 MG tablet Take 70 mg by mouth every 7 (seven) days. Take with a full glass of water on an empty stomach.      . diltiazem (TIAZAC) 360 MG 24 hr capsule Take 360 mg by mouth daily.      . fesoterodine (TOVIAZ) 8 MG TB24 Take 8 mg by mouth daily.      . finasteride (PROSCAR) 5 MG tablet Take 5 mg by mouth daily.      . furosemide (LASIX) 20 MG tablet Take 20 mg by mouth daily.      Marland Kitchen glimepiride (AMARYL) 2 MG tablet Take 4 mg by mouth 2 (two) times daily.       . indapamide (LOZOL) 1.25 MG tablet Take 1.25 mg by mouth every morning.      . insulin glargine (LANTUS) 100 UNIT/ML injection Inject 18 Units into the skin at bedtime.      .  insulin lispro (HUMALOG) 100 UNIT/ML injection Inject 15 Units into the skin 3 (three) times daily before meals.       . metFORMIN (GLUCOPHAGE) 500 MG tablet Take 500 mg by mouth daily with supper.      . mirabegron ER (MYRBETRIQ) 25 MG TB24 Take 25 mg by mouth at bedtime.      . pioglitazone (ACTOS) 45 MG tablet Take 45 mg by mouth every other day.      . Polysacch Fe Cmp-Fe Heme Poly (BIFERA) 28 MG TABS Take 1 tablet by mouth daily.      . simvastatin (ZOCOR) 20 MG tablet Take 20 mg by mouth every evening.      . zolpidem (AMBIEN) 5 MG tablet Take 5 mg by mouth at bedtime as needed. For insomnia      . ferumoxytol (FERAHEME) 510 MG/17ML SOLN Inject 17 mLs (510 mg total) into the vein once.  15.08 mL  1   Scheduled:    . [COMPLETED] acetaminophen  1,000 mg Oral Once  . albuterol  2.5 mg Nebulization Q4H  . antiseptic oral rinse  15 mL Mouth Rinse QID  . [COMPLETED] azithromycin  500  mg Oral Daily  . cefTRIAXone (ROCEPHIN)  IV  1 g Intravenous Q24H  . chlorhexidine  15 mL Mouth Rinse BID  . diltiazem  360 mg Oral Daily  . famotidine (PEPCID) IV  20 mg Intravenous Q12H  . [COMPLETED] fentaNYL  200 mcg Intravenous Once  . [COMPLETED] furosemide  40 mg Intravenous Once  . heparin subcutaneous  5,000 Units Subcutaneous Q8H  . insulin aspart  2-6 Units Subcutaneous Q4H  . insulin glargine  15 Units Subcutaneous QHS  . levofloxacin (LEVAQUIN) IV  750 mg Intravenous Q48H  . [COMPLETED] LORazepam  0.5 mg Intravenous Once  . [COMPLETED] LORazepam  2 mg Intravenous Once  . oseltamivir  150 mg Per Tube BID  . pneumococcal 23 valent vaccine  0.5 mL Intramuscular Tomorrow-1000  . sodium chloride  3 mL Intravenous Q12H  . vancomycin  1,500 mg Intravenous Q24H  . vancomycin  2,500 mg Intravenous Once  . [DISCONTINUED] antiseptic oral rinse  15 mL Mouth Rinse BID  . [DISCONTINUED] azithromycin  250 mg Oral Daily  . [DISCONTINUED] enoxaparin (LOVENOX) injection  40 mg Subcutaneous Q24H  .  [DISCONTINUED] fesoterodine  8 mg Oral Daily  . [DISCONTINUED] finasteride  5 mg Oral Daily  . [DISCONTINUED] glimepiride  4 mg Oral BID WC  . [DISCONTINUED] indapamide  1.25 mg Oral Daily  . [DISCONTINUED] insulin aspart  0-15 Units Subcutaneous TID WC  . [DISCONTINUED] insulin aspart  0-5 Units Subcutaneous QHS  . [DISCONTINUED] oseltamivir  75 mg Per Tube BID  . [DISCONTINUED] oseltamivir  75 mg Oral BID  . [DISCONTINUED] pioglitazone  45 mg Oral Q48H  . [DISCONTINUED] simvastatin  20 mg Oral q1800    Assessment: 79yo male had been admitted to regular floor for 23hr obs for bronchitis/costochondritis, overnight c/o SOB which progressed to requiring intubation, CXR shows possible PNA, to add vanc and Rocephin to ABX regimen for severe CAP.  Goal of Therapy:  Vancomycin trough level 15-20 mcg/ml  Plan:  Will give vancomycin 2500mg  IV x1 followed by 1500mg  IV Q24H as well as Rocephin 1g IV Q24H and monitor CBC, Cx, levels prn.  Rogue Bussing PharmD BCPS 06/20/2012,7:08 AM

## 2012-06-20 NOTE — Progress Notes (Signed)
Daughter called to RN to room approximately 05:00. Nurse went to room, Pt was unresponsive with blank stare, and agonal breathing Pt was on 100% non re breather mask, Rapid response notified, MD notified, respiratory therapist arrived to room. Vital signs obtained, see flow sheet,  Pt given 2 Mg IV ativan, Fentanyl 150 mcg IV, Pt was intubated in room by MD, report called to 2100 RN, Pt was transferred by rapid response, MD, respiratory therapist , family here and aware.

## 2012-06-20 NOTE — Procedures (Signed)
Intubation Procedure Note [name] [id number] [birth year]  Procedure: Intubation Indications: Respiratory insufficiency  Procedure Details Consent: Unable to obtain consent because of emergent medical necessity. Time Out: Verified patient identification, verified procedure, site/side was marked, verified correct patient position, special equipment/implants available, medications/allergies/relevent history reviewed, required imaging and test results available.  Performed  Drugs Ativan 2mg , Fentanyl 67mcg DL x 1 with MAC 4 blade Grade 2 view 7.5 tube passed through cords under direct visualization Placement confirmed with bilateral breath sounds, positive EtCO2 change and smoke in tube   Evaluation Hemodynamic Status: BP stable throughout; O2 sats: stable throughout Patient's Current Condition: stable Complications: No apparent complications Patient did tolerate procedure well. Chest X-ray ordered to verify placement.  CXR: tube position acceptable.   Clayton [name] 06/20/2012

## 2012-06-20 NOTE — Progress Notes (Signed)
Called by primary RN to see pt with c/o feeling SOB.  On arrival pt was sitting in chair with feet propped on bed.  Pt was on 3L [place] Sats 92-94%, RR 25, BBS CTA. Pt was able to carry on a fluid conversation.  Pt & pt's dtr were concerned that he needed his pain meds so the primary RN gave pain meds.  Will cont. To monitor.

## 2012-06-20 NOTE — Progress Notes (Signed)
INITIAL NUTRITION ASSESSMENT  DOCUMENTATION CODES Per approved criteria  -Obesity Unspecified   INTERVENTION:  Initiate TF via OG tube with Oxepa at 20 ml/h, increase by 10 ml every 4 hours to goal rate of 40 ml/h with Prostat 60 ml TID to provide 2040 kcals (25 kcals/kg ideal weight), 150 gm protein (94% estimated needs), 754 ml free water daily.  NUTRITION DIAGNOSIS: Inadequate oral intake related to inability to eat as evidenced by NPO status.   Goal: Enteral nutrition to provide 60-70% of estimated calorie needs (22-25 kcals/kg ideal body weight) and >/= 90% of estimated protein needs, based on ASPEN guidelines for permissive underfeeding in critically ill obese individuals.  Monitor:  TF tolerance/adequacy, weight trend, labs, I/O  Reason for Assessment: MD Consult for TF initiation and management.  79 y.o. male  Admitting Dx: H1N1 influenza with pneumonia  ASSESSMENT: Patient required intubation early this morning.  Positive for H1N1.    Patient is currently intubated on ventilator support.  MV: 12.6 Temp:Temp (24hrs), Avg:98 F (36.7 C), Min:97.4 F (36.3 C), Max:98.4 F (36.9 C)   Height: Ht Readings from Last 1 Encounters:  06/20/12 6' (1.829 m)    Weight: Wt Readings from Last 1 Encounters:  06/20/12 244 lb 11.4 oz (111 kg)    Ideal Body Weight: 80.9 kg  % Ideal Body Weight: 137%  Wt Readings from Last 10 Encounters:  06/20/12 244 lb 11.4 oz (111 kg)  09/01/10 233 lb 3.2 oz (105.779 kg)  06/09/10 237 lb 12.8 oz (107.865 kg)  05/19/10 236 lb (107.049 kg)    Usual Body Weight: 233-237 lb  % Usual Body Weight: 104%  BMI:  Body mass index is 33.19 kg/(m^2).  Class 1 obesity  Estimated Nutritional Needs: Kcal: 2175 Protein: >160 gm Fluid: 2.2 L  Skin: no problems noted  Diet Order: Carb Control  EDUCATION NEEDS: -Education not appropriate at this time   Intake/Output Summary (Last 24 hours) at 06/20/12 0912 Last data filed at 06/20/12  0700  Gross per 24 hour  Intake 1077.5 ml  Output    250 ml  Net  827.5 ml    Last BM: 1/3   Labs:   Lab 06/20/12 0700 06/19/12 0610 06/19/12 0030  NA 134* 137 137  K 4.9 3.6 3.6  CL 92* 95* 97  CO2 26 26 26   BUN PENDING 30* 31*  CREATININE PENDING 1.47* 1.60*  CALCIUM 8.2* 9.0 9.4  MG -- -- --  PHOS 6.8* -- --  GLUCOSE 313* 362* 276*    CBG (last 3)   Basename 06/20/12 0554 06/20/12 0515 06/19/12 2022  GLUCAP 320* 292* 222*    Scheduled Meds:   . albuterol  2.5 mg Nebulization Q4H  . antiseptic oral rinse  15 mL Mouth Rinse QID  . cefTRIAXone (ROCEPHIN)  IV  1 g Intravenous Q24H  . chlorhexidine  15 mL Mouth Rinse BID  . Chlorhexidine Gluconate Cloth  6 each Topical Q0600  . famotidine (PEPCID) IV  20 mg Intravenous Q12H  . heparin subcutaneous  5,000 Units Subcutaneous Q8H  . levofloxacin (LEVAQUIN) IV  750 mg Intravenous Q48H  . mupirocin ointment  1 application Nasal BID  . oseltamivir  150 mg Per Tube BID  . pneumococcal 23 valent vaccine  0.5 mL Intramuscular Tomorrow-1000  . sodium chloride  3 mL Intravenous Q12H  . vancomycin  1,500 mg Intravenous Q24H  . vancomycin  2,500 mg Intravenous Once    Continuous Infusions:   . insulin (NOVOLIN-R)  infusion    . norepinephrine (LEVOPHED) Adult infusion      Past Medical History  Diagnosis Date  . Diabetes mellitus   . Hypertension   . Anemia   . Chronic renal insufficiency   . Osteoporosis   . Neuromuscular disorder   . DDD (degenerative disc disease), lumbar     Past Surgical History  Procedure Date  . Vasectomy   . Eye surgery     Molli Barrows, RD, LDN, [place] Pager# [phone] After Hours Pager# [phone]

## 2012-06-20 NOTE — Progress Notes (Signed)
Pt c/o of not being able to breathe and requested breathing treatment.daughter was at bedside also requesting for pain meds for PT. Respiratory Therapist notified,
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
## 2012-06-20 NOTE — Progress Notes (Signed)
Breathing treatment administered but RN was paged to room by daughter and Pt states he is still having difficulty breathing.  Pt was on 3L [place] and sitting in chair. Rapid response notified.

## 2012-06-20 NOTE — Progress Notes (Signed)
Pt seen by Rapid response, Pt given IS and educated on it's use. Pain meds given and Pt remained calm while sitting in chair with daughter by his side.

## 2012-06-20 NOTE — Progress Notes (Signed)
PA  paged and made aware of the situation, PA came to assess the PT. New orders received.

## 2012-06-20 NOTE — Progress Notes (Signed)
PULMONARY  / CRITICAL CARE MEDICINE (Resident Note)  Name: Clayton [name] MRN: [id number] DOB: 1928/05/21    LOS: 2  REFERRING MD :  [facility]  CHIEF COMPLAINT:  Shortness of breath and chest pain  BRIEF PATIENT DESCRIPTION: [name] is a 79 y.o. male with a PMHX of DM2, CKD, HTN who was admitted to [facility] on 06/19/2012 with acute respiratory failure secondary to H1N1 and requiring intubation on 06/20/2012.   LINES / TUBES: ETT 01/03 >>  CULTURES: Influenza Panel 06/19/2012 > H1N1 POSITIVE  ANTIBIOTICS: Azithromycin 1/2 > 1/2 Levaquin 1/3 >> Tamiflu 1/2 >> vanc 1/3>>> ceftriaxone 1/3>>>  SIGNIFICANT EVENTS:  06/21/2011 -  Patient developed acute respiratory failure requiring ETT 1/3- broderline BP  LEVEL OF CARE:  ICU PRIMARY SERVICE:  PCCM CONSULTANTS:  None CODE STATUS: full DIET:  TF 1/3>>> DVT Px: hep sub q  GI Px:  Pepcid  INTERVAL HISTORY:  01/03 AM ETT, borderline BP  VITAL SIGNS: Temp:  [97.4 F (36.3 C)-99.2 F (37.3 C)] 99.2 F (37.3 C) (01/03 1415) Pulse Rate:  [58-100] 76  (01/03 1415) Resp:  [9-24] 23  (01/03 1415) BP: (79-185)/(35-78) 127/53 mmHg (01/03 1415) SpO2:  [91 %-100 %] 100 % (01/03 1415) FiO2 (%):  [40 %-100 %] 40 % (01/03 1200) Weight:  [111 kg (244 lb 11.4 oz)] 111 kg (244 lb 11.4 oz) (01/03 0600) HEMODYNAMICS:   VENTILATOR SETTINGS: Vent Mode:  [-] PRVC FiO2 (%):  [40 %-100 %] 40 % Set Rate:  [20 bmp-24 bmp] 24 bmp Vt Set:  [540 mL] 540 mL PEEP:  [5 cmH20] 5 cmH20 Plateau Pressure:  [29 cmH20-36 cmH20] 29 cmH20 INTAKE / OUTPUT: Intake/Output      01/02 0701 - 01/03 0700 01/03 0701 - 01/04 0700   P.O. 240    I.V. (mL/kg) 927.5 (8.4) 37.4 (0.3)   NG/GT  160   IV Piggyback 150 1350   Total Intake(mL/kg) 1317.5 (11.9) 1547.4 (13.9)   Urine (mL/kg/hr) 250 (0.1) 100 (0.1)   Total Output 250 100   Net +1067.5 +1447.4          PHYSICAL EXAMINATION: General:  Vent, no distress Neuro:  PERR , nonfocal, rass  0 HEENT:  Mucous membranes dry. Cardiovascular:  RRR, s1 s2  No m, int brady Lungs:  Coarse breath sounds bilateral Abdomen:  Normoactive bowel sounds, distended but soft. Musculoskeletal:  + trace pretibial edema. Skin:  Warm, moist   LABS: Cbc  Lab 06/20/12 0700 06/19/12 0610 06/19/12 0030  WBC 14.6* -- --  HGB 11.2* 11.7* 12.1*  HCT 34.2* 36.1* 36.7*  PLT 221 225 229    Chemistry   Lab 06/20/12 0700 06/19/12 0610 06/19/12 0030  NA 134* 137 137  K 4.9 3.6 3.6  CL 92* 95* 97  CO2 26 26 26   BUN 45* 30* 31*  CREATININE 2.73* 1.47* 1.60*  CALCIUM 8.2* 9.0 9.4  MG -- -- --  PHOS 6.8* -- --  GLUCOSE 313* 362* 276*    Liver fxn  Lab 06/20/12 0700  AST --  ALT --  ALKPHOS --  BILITOT --  PROT --  ALBUMIN 3.1*   coags No results found for this basename: APTT:3,INR:3 in the last 168 hours Sepsis markers No results found for this basename: LATICACIDVEN:3,PROCALCITON:3 in the last 168 hours Cardiac markers  Lab 06/19/12 0610 06/19/12 0030  CKTOTAL -- --  CKMB -- --  TROPONINI <0.30 <0.30   BNP  Lab 06/20/12 0700 06/19/12 0240  PROBNP 3548.0* 1178.0*   ABG  Lab 06/20/12 0956 06/20/12 0943 06/20/12 0700  PHART 7.297* -- 7.243*  PCO2ART 47.7* -- 68.0*  PO2ART 99.0 -- 413.0*  HCO3 23.3 19.2* 29.5*  TCO2 25 21 32    CBG trend  Lab 06/20/12 0554 06/20/12 0515 06/19/12 2022 06/19/12 1630 06/19/12 1123  GLUCAP 320* 292* 222* 217* 344*    IMAGING:  CXR (06/18/2012) -   CT Angio Chest (06/18/2012) - No evidence of PE, 48mm nodule within LLL recommending 1 year followup chest CT, coronary calcifications.  CXR (06/20/2012) - Patchy right mid and lower lung zone airspace opacification, concerning for pneumonia; more mild left basilar airspace opacity also seen  CXR (06/20/2012) Post intubation - 1. Endotracheal tube seen ending 3-4 cm above the carina. 2. Persistent multifocal pneumonia, perhaps slightly worsened on the right side.  DIAGNOSES: Principal  Problem:  *H1N1 influenza with pneumonia Active Problems:  HYPERTENSION  Hypoxia  Diabetes mellitus  Chronic kidney disease  Hard of hearing  Acute respiratory failure   ASSESSMENT / PLAN:  PULMONARY  ASSESSMENT:  1) VDRF - 2/2 H1N1 and possible bibasilar and with possible superimposed bacterial pneumonia (CAP), possible developing ARDS. Intubated on 06/20/2012. 2) LLL 43mm pulmonary nodule - noted on CT angiogram, will require 1 year follow chest CT in setting of prior tobacco abuse. PLAN:   - would likely need cvp -ABg reviewed, goal to 7 cc/kg, keep plat less 30  Repeat abg -keep elevated rr -on min fio2 / support -pcxr in am   CARDIOVASCULAR  ASSESSMENT:  1) Hypertension - blood pressures stable at present. On home indapamide (thiazide diuretic) and Cardizem. Has been borderline 2) Hyperlipidemia - on home simvastatin. Will be held while intubated. PLAN:  - Continue to monitor.  - PRN labetalol, dc - Check proBNP. - Likely will need central line to allow for more accurate CVP measurements, and edema component - Check 2-D echocardiogram awaited  RENAL  ASSESSMENT:   1) CKD (unknown baseline). Admission SCr 1.6, now ARF 2) BPH - on home finasteride. Held while intubated. PLAN:   - renal US assess hydro, h/o BPH -cvp assessment -allow pos balance for now  GASTROINTESTINAL  ASSESSMENT:  Concern constipation PLAN:   Add coalce Star tT and observe pepcid  HEMATOLOGIC  ASSESSMENT:  1) Macrocytic anemia, baseline Hgb unknown. Admission Hgb 12.1, unclear cause. Patient is also on fereheme at home, for unclear reason. PLAN:  -obtain coags Cbc in am  Sub q hep ok  INFECTIOUS  ASSESSMENT:  1) H1N1 positive  2) CXR concerning for multifocal right pneumonia, CAP. PLAN:   - Continue tamilfu high dose 150 qday, may ned reduction with crt rise - Continue Levaquin IV. - Check urine strep and legionella antigens. - Start Rocephin and Vancomycin per  pharmacy -bands noted, r/o superinfection  ENDOCRINE  ASSESSMENT:  1) DM2, unknown baseline A1c.  On home oral hypoglycemics in addition to TID humalog. PLAN:   - Check A1c. - ICU hyperglycemia protocol, on insulin drip  NEUROLOGIC  ASSESSMENT:  1) Acute encephalopathy secondary to acute respiratory failure PLAN:   rass goal -1 fent prn   CLINICAL SUMMARY:  [name] is a 79 y.o. male with a PMHX of DM2, CKD, HTN who was admitted to [facility] on 06/19/2012 with acute respiratory failure secondary to H1N1 and multifocal CAP, requiring intubation on 06/20/2012. NOw ARF, ARDS. Place line, reduce TV, abg to follow  I have personally obtained a history, examined the patient,  evaluated laboratory and imaging results, formulated the assessment and plan and placed orders.  CRITICAL CARE: The patient is critically ill with multiple organ systems failure and requires high complexity decision making for assessment and support, frequent evaluation and titration of therapies, application of advanced monitoring technologies and extensive interpretation of multiple databases. Critical Care Time devoted to patient care services described in this note is 45 minutes.   Ena Dawley PCCM Pager: [phone] Cell: [phone] If no response, call [phone]

## 2012-06-20 NOTE — Progress Notes (Signed)
79yo male c/o SOB, new CXR concerning for PNA, to begin IV ABX.  Will start Levaquin 750mg  IV Q48H and monitor CBC, Cx, CrCl.  Wynona Neat, PharmD, BCPS 06/20/2012 5:07 AM
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
## 2012-06-20 NOTE — Progress Notes (Signed)
Called by RN to see pt for resp distress.  On arrival pt was unresponsive with a pulse RR 10-12, Sats 85% on NRB, with "guppy" breaths.  CCM contacted for emergent intubation.  Pt was bagged until intubation & transferred to 2108 without difficulty.

## 2012-06-20 NOTE — H&P (Signed)
PULMONARY  / CRITICAL CARE MEDICINE (Resident Note)  Name: Clayton [name] MRN: [id number] DOB: 05-20-1928    LOS: 2  REFERRING MD :  [facility]  CHIEF COMPLAINT:  Shortness of breath and chest pain  BRIEF PATIENT DESCRIPTION: [name] is a 79 y.o. male with a PMHX of DM2, CKD, HTN who was admitted to [facility] on 06/19/2012 with acute respiratory failure secondary to H1N1 and requiring intubation on 06/20/2012.   LINES / TUBES: ETT 01/03 >>  CULTURES: Influenza Panel 06/19/2012 > H1N1 POSITIVE  ANTIBIOTICS: Azithromycin 1/2 > 1/2 Levaquin 1/3 >> Tamiflu 1/2 >>  SIGNIFICANT EVENTS:  06/21/2011 -  Patient developed acute respiratory failure requiring ETT per Dr. Lake Bells  LEVEL OF CARE:  ICU PRIMARY SERVICE: [facility], will now to changed to PCCM CONSULTANTS:  None CODE STATUS:  DIET:  NPO DVT Px: Lovenox GI Px:  Pepcid   HISTORY OF PRESENT ILLNESS:  [name] is a 79 y.o., male with a PMHX of type 2 diabetes mellitus, CKD, HTN who was admitted by family medicine service on 06/19/2012 for evaluation of shortness of breath, hypoxia, and body aches x 4 days. During the workup, he was determined to be H1N1 positive. During the hospital course, the patient continued to have intermittent episodes of worsening shortness of breath. Overnight, the patient acutely decompensated at approximately 3AM this morning with increased work of breathing, hypoxia to 87% and was subsequently placed on NRB mask with persistent hypoxemia. Patient was evaluated by RRT and then Dr. Lake Bells, he subsequently required endotracheal intubation on 06/20/2012 and has been transferred to the ICU under the care of PCCM.   PAST MEDICAL HISTORY :  Past Medical History  Diagnosis Date  . Diabetes mellitus   . Hypertension   . Anemia   . Chronic renal insufficiency   . Osteoporosis   . Neuromuscular disorder   . DDD (degenerative disc disease), lumbar     Past Surgical History    Procedure Date  . Vasectomy   . Eye surgery     Prior to Admission medications  Medication Sig  Acetaminophen (TYLENOL ARTHRITIS PAIN PO) Take 2 tablets by mouth 2 (two) times daily.  acetaminophen (TYLENOL) 325 MG tablet Take 650 mg by mouth every 6 (six) hours as needed. For pain  alendronate (FOSAMAX) 70 MG tablet Take 70 mg by mouth every 7 (seven) days. Take with a full glass of water on an empty stomach.  diltiazem (TIAZAC) 360 MG 24 hr capsule Take 360 mg by mouth daily.  fesoterodine (TOVIAZ) 8 MG TB24 Take 8 mg by mouth daily.  finasteride (PROSCAR) 5 MG tablet Take 5 mg by mouth daily.  furosemide (LASIX) 20 MG tablet Take 20 mg by mouth daily.  glimepiride (AMARYL) 2 MG tablet Take 4 mg by mouth 2 (two) times daily.   indapamide (LOZOL) 1.25 MG tablet Take 1.25 mg by mouth every morning.  insulin glargine (LANTUS) 100 UNIT/ML injection Inject 18 Units into the skin at bedtime.  insulin lispro (HUMALOG) 100 UNIT/ML injection Inject 15 Units into the skin 3 (three) times daily before meals.   metFORMIN (GLUCOPHAGE) 500 MG tablet Take 500 mg by mouth daily with supper.  mirabegron ER (MYRBETRIQ) 25 MG TB24 Take 25 mg by mouth at bedtime.  pioglitazone (ACTOS) 45 MG tablet Take 45 mg by mouth every other day.  Polysacch Fe Cmp-Fe Heme Poly (BIFERA) 28 MG TABS Take 1 tablet by mouth daily.  simvastatin (ZOCOR) 20  MG tablet Take 20 mg by mouth every evening.  zolpidem (AMBIEN) 5 MG tablet Take 5 mg by mouth at bedtime as needed. For insomnia  ferumoxytol (FERAHEME) 510 MG/17ML SOLN Inject 17 mLs (510 mg total) into the vein once.    Allergies  Allergen Reactions  . Penicillins     FAMILY HISTORY:  Family History  Problem Relation Age of Onset  . Diabetes Mellitus II    . Hypertension     SOCIAL HISTORY:  reports that he quit smoking about 39 years ago. He quit smokeless tobacco use about 36 years ago. He reports that he does not drink alcohol or use illicit  drugs.  REVIEW OF SYSTEMS:  Review of systems cannot be obtained as the patient is intubated and sedated, level V caveat applies.   INTERVAL HISTORY:  01/03 AM   VITAL SIGNS: Temp:  [97.4 F (36.3 C)-98.1 F (36.7 C)] 97.4 F (36.3 C) (01/03 0600) Pulse Rate:  [80-102] 91  (01/03 0600) Resp:  [16-24] 16  (01/03 0600) BP: (117-185)/(50-78) 117/78 mmHg (01/03 0600) SpO2:  [91 %-100 %] 100 % (01/03 0600) FiO2 (%):  [100 %] 100 % (01/03 0600) Weight:  [244 lb 11.4 oz (111 kg)] 244 lb 11.4 oz (111 kg) (01/03 0600) HEMODYNAMICS:   VENTILATOR SETTINGS: Vent Mode:  [-] PRVC FiO2 (%):  [100 %] 100 % Set Rate:  [20 bmp] 20 bmp Vt Set:  [510 mL] 510 mL PEEP:  [5 cmH20] 5 cmH20 Plateau Pressure:  [36 cmH20] 36 cmH20 INTAKE / OUTPUT: Intake/Output      01/02 0701 - 01/03 0700   P.O. 240   I.V. (mL/kg) 1027.5 (9.3)   Total Intake(mL/kg) 1267.5 (11.4)   Urine (mL/kg/hr) 250 (0.1)   Total Output 250   Net +1017.5         PHYSICAL EXAMINATION: General:  Patient is intubated and sedated, appears to be in no acute distress. Neuro:  PERRL, withdraws to painful stimuli. HEENT:  Mucous membranes dry. Cardiovascular:  RRR, difficult to assess for murmurs in setting of coarse breath sounds Lungs:  Coarse breath sounds bilaterally, anterior mild wheezing. Abdomen:  Normoactive bowel sounds, distended but soft. Musculoskeletal:  + trace pretibial edema. Skin:  Warm, moist   LABS: Cbc  Lab 06/19/12 0610 06/19/12 0030  WBC 13.8* --  HGB 11.7* 12.1*  HCT 36.1* 36.7*  PLT 225 229    Chemistry   Lab 06/19/12 0610 06/19/12 0030  NA 137 137  K 3.6 3.6  CL 95* 97  CO2 26 26  BUN 30* 31*  CREATININE 1.47* 1.60*  CALCIUM 9.0 9.4  MG -- --  PHOS -- --  GLUCOSE 362* 276*    Liver fxn No results found for this basename: AST:3,ALT:3,ALKPHOS:3,BILITOT:3,PROT:3,ALBUMIN:3 in the last 168 hours coags No results found for this basename: APTT:3,INR:3 in the last 168 hours Sepsis  markers No results found for this basename: LATICACIDVEN:3,PROCALCITON:3 in the last 168 hours Cardiac markers  Lab 06/19/12 0610 06/19/12 0030  CKTOTAL -- --  CKMB -- --  TROPONINI <0.30 <0.30   BNP  Lab 06/19/12 0240  PROBNP 1178.0*   ABG No results found for this basename: PHART:3,PCO2ART:3,PO2ART:3,HCO3:3,TCO2:3 in the last 168 hours  CBG trend  Lab 06/20/12 0554 06/20/12 0515 06/19/12 2022 06/19/12 1630 06/19/12 1123  GLUCAP 320* 292* 222* 217* 344*    IMAGING:  CXR (06/18/2012) -   CT Angio Chest (06/18/2012) - No evidence of PE, 5mm nodule within LLL recommending 1 year followup  chest CT, coronary calcifications.  CXR (06/20/2012) - Patchy right mid and lower lung zone airspace opacification, concerning for pneumonia; more mild left basilar airspace opacity also seen   CXR (06/20/2012) Post intubation - 1. Endotracheal tube seen ending 3-4 cm above the carina. 2. Persistent multifocal pneumonia, perhaps slightly worsened on the right side.    DIAGNOSES: Principal Problem:  *H1N1 influenza with pneumonia Active Problems:  HYPERTENSION  Hypoxia  Diabetes mellitus  Chronic kidney disease  Hard of hearing  Acute respiratory failure   ASSESSMENT / PLAN:  PULMONARY  ASSESSMENT:  1) VDRF - 2/2 H1N1 and possible bibasilar and with possible superimposed bacterial pneumonia (CAP), possible developing ARDS. Intubated on 06/20/2012. 2) LLL 65mm pulmonary nodule - noted on CT angiogram, will require 1 year follow chest CT in setting of prior tobacco abuse. PLAN:   - Continue IV Levaquin - Start Vancomycin and Rocephin per pharmacy - Start tamiflu solution via tube x 4 additional days.   CARDIOVASCULAR  ASSESSMENT:  1) Hypertension - blood pressures stable at present. On home indapamide (thiazide diuretic) and Cardizem. 2) Hyperlipidemia - on home simvastatin. Will be held while intubated. PLAN:  - Continue to monitor.  - PRN labetalol. - Check proBNP. - Obtain  central line to allow for more accurate CVP measurements. - Check 2-D echocardiogram.  RENAL  ASSESSMENT:   1) CKD (unknown baseline). Admission SCr 1.6, downtrending to 1.4. 2) BPH - on home finasteride. Held while intubated. PLAN:   - Continue to monitor renal function, dose adjustments as needed. - Renal function panel.  GASTROINTESTINAL  ASSESSMENT:  N/A PLAN:   N/A  HEMATOLOGIC  ASSESSMENT:  1) Macrocytic anemia, baseline Hgb unknown. Admission Hgb 12.1, unclear cause. Patient is also on fereheme at home, for unclear reason. PLAN:  - Continue to follow daily CBC. - Check anemia panel.  INFECTIOUS  ASSESSMENT:  1) H1N1 positive  2) CXR concerning for multifocal right pneumonia, CAP. PLAN:   - Check blood cultures x 2 given acute decompensation. - Continue tamilfu. - Continue Levaquin IV. - Check urine strep and legionella antigens. - Start Rocephin and Vancomycin per pharmacy - Sputum cultures x 2.  ENDOCRINE  ASSESSMENT:  1) DM2, unknown baseline A1c.  On home oral hypoglycemics in addition to TID humalog. PLAN:   - Check A1c. - ICU hyperglycemia protocol.   NEUROLOGIC  ASSESSMENT:  1) Acute encephalopathy secondary to acute respiratory failure PLAN:   - Continue to monitor with improvement of infectious etiologies.   CLINICAL SUMMARY:  [name] is a 79 y.o. male with a PMHX of DM2, CKD, HTN who was admitted to [facility] on 06/19/2012 with acute respiratory failure secondary to H1N1 and multifocal CAP, requiring intubation on 06/20/2012.   Signed: Chilton Greathouse, Jacelyn Pi, Internal Medicine Resident Pager: [phone] (7PM-6AM) 06/20/2012, 6:55 AM    Case and plan of care discussed with attending physician, Dr. Simonne Maffucci.   Attending:  I have seen and examined the patient with nurse practitioner/resident and agree with the note above.   Severe CAP from H1N1, start tube feeds, place lines, prepare for the long haul on the  vent.  I have personally obtained a history, examined the patient, evaluated laboratory and imaging results, formulated the assessment and plan and placed orders.  CRITICAL CARE: The patient is critically ill with multiple organ systems failure and requires high complexity decision making for assessment and support, frequent evaluation and titration of therapies, application of advanced monitoring technologies and extensive  interpretation of multiple databases. Critical Care Time devoted to patient care services described in this note is 45 minutes.   Jillyn Hidden PCCM Pager: [phone] Cell: [phone] If no response, call [phone]

## 2012-06-20 NOTE — Progress Notes (Signed)
  Echocardiogram 2D Echocardiogram has been performed.  Ardelle Balls A 06/20/2012, 11:42 AM

## 2012-06-21 ENCOUNTER — Inpatient Hospital Stay (HOSPITAL_COMMUNITY): Payer: Medicare PPO

## 2012-06-21 LAB — COMPREHENSIVE METABOLIC PANEL
AST: 30 U/L (ref 0–37)
BUN: 57 mg/dL — ABNORMAL HIGH (ref 6–23)
CO2: 25 mEq/L (ref 19–32)
Chloride: 102 mEq/L (ref 96–112)
Creatinine, Ser: 2.16 mg/dL — ABNORMAL HIGH (ref 0.50–1.35)
GFR calc Af Amer: 31 mL/min — ABNORMAL LOW (ref >90)
GFR calc non Af Amer: 27 mL/min — ABNORMAL LOW (ref >90)
Glucose, Bld: 163 mg/dL — ABNORMAL HIGH (ref 70–99)
Total Bilirubin: 0.2 mg/dL — ABNORMAL LOW (ref 0.3–1.2)

## 2012-06-21 LAB — GLUCOSE, CAPILLARY
Glucose-Capillary: 351 mg/dL — ABNORMAL HIGH (ref 70–99)
Glucose-Capillary: 286 mg/dL — ABNORMAL HIGH (ref 70–99)
Glucose-Capillary: 126 mg/dL — ABNORMAL HIGH (ref 70–99)
Glucose-Capillary: 167 mg/dL — ABNORMAL HIGH (ref 70–99)
Glucose-Capillary: 155 mg/dL — ABNORMAL HIGH (ref 70–99)
Glucose-Capillary: 160 mg/dL — ABNORMAL HIGH (ref 70–99)
Glucose-Capillary: 162 mg/dL — ABNORMAL HIGH (ref 70–99)
Glucose-Capillary: 146 mg/dL — ABNORMAL HIGH (ref 70–99)

## 2012-06-21 LAB — CBC
MCH: 33.2 pg (ref 26.0–34.0)
MCHC: 33.7 g/dL (ref 30.0–36.0)
Platelets: 222 10*3/uL (ref 150–400)
RBC: 3.1 MIL/uL — ABNORMAL LOW (ref 4.22–5.81)

## 2012-06-21 LAB — URINE CULTURE
Colony Count: NO GROWTH
Culture: NO GROWTH

## 2012-06-21 MED ORDER — POTASSIUM CHLORIDE 20 MEQ/15ML (10%) PO LIQD
40.0000 meq | Freq: Once | ORAL | Status: AC
  Administered 2012-06-21: 40 meq
  Filled 2012-06-21 (×2): qty 30

## 2012-06-21 MED ORDER — DEXTROSE 10 % IV SOLN: INTRAVENOUS | Status: DC | PRN

## 2012-06-21 MED ORDER — POTASSIUM CHLORIDE 20 MEQ/15ML (10%) PO LIQD
ORAL | Status: AC
  Filled 2012-06-21: qty 30

## 2012-06-21 MED ORDER — INSULIN ASPART 100 UNIT/ML ~~LOC~~ SOLN
1.0000 [IU] | SUBCUTANEOUS | Status: DC
  Administered 2012-06-21: 1 [IU] via SUBCUTANEOUS
  Administered 2012-06-21: 3 [IU] via SUBCUTANEOUS
  Administered 2012-06-21: 2 [IU] via SUBCUTANEOUS
  Administered 2012-06-22: 3 [IU] via SUBCUTANEOUS
  Administered 2012-06-22 (×2): 2 [IU] via SUBCUTANEOUS

## 2012-06-21 MED ORDER — INSULIN GLARGINE 100 UNIT/ML ~~LOC~~ SOLN
40.0000 [IU] | SUBCUTANEOUS | Status: DC
  Administered 2012-06-21: 40 [IU] via SUBCUTANEOUS

## 2012-06-21 MED ORDER — INSULIN ASPART 100 UNIT/ML ~~LOC~~ SOLN
7.0000 [IU] | SUBCUTANEOUS | Status: DC
  Administered 2012-06-21 – 2012-06-22 (×6): 7 [IU] via SUBCUTANEOUS

## 2012-06-21 NOTE — Progress Notes (Signed)
[facility] ADULT ICU REPLACEMENT PROTOCOL FOR AM LAB REPLACEMENT ONLY  The patient does not apply for the [facility] Adult ICU Electrolyte Replacment Protocol based on the criteria listed below:   1. Is GFR >/= 50 ml/min? no  Patient's GFR today is 27 2. Is urine output >/= 0.5 ml/kg/hr for the last 8 hours? no Patient's UOP is  ml/kg/hr 3. Is BUN < 30 mg/dL? no  Patient's BUN today is 57 4. Abnormal electrolyte(s): K 3.4 5. Ordered repletion with:  6. If a panic level lab has been reported, has the CCM MD in charge been notified? yes.   Physician:  resident  Orvil Feil, Elizebeth Brooking A 06/21/2012 6:42 AM

## 2012-06-21 NOTE — Progress Notes (Signed)
PULMONARY  / CRITICAL CARE MEDICINE (Resident Note)  Name: JOBANNY CORBELLO MRN: [id number] DOB: 1928-03-30    LOS: 51  REFERRING MD :  [facility]  CHIEF COMPLAINT:  Shortness of breath and chest pain  BRIEF PATIENT DESCRIPTION: Mr. JASAAN THIGPEN is a 79 y.o. male with a PMHX of DM2, CKD, HTN who was admitted to [facility] on 06/19/2012 with acute respiratory failure secondary to H1N1 and requiring intubation on 06/21/2012.  LINES / TUBES: ETT 01/03 >>  CULTURES: Influenza Panel 06/19/2012 > H1N1 POSITIVE  ANTIBIOTICS: Azithromycin 1/2 > 1/2 Levaquin 1/3 >> Tamiflu 1/2 >> vanc 1/3>>> ceftriaxone 1/3>>>  SIGNIFICANT EVENTS:  06/21/2011 -  Patient developed acute respiratory failure requiring ETT 1/3- broderline BP  LEVEL OF CARE:  ICU PRIMARY SERVICE:  PCCM CONSULTANTS:  None CODE STATUS: full DIET:  TF 1/3>>> DVT Px: hep sub q  GI Px:  Pepcid  INTERVAL HISTORY:  01/03 AM ETT, borderline BP 1-4 awake and weaning  VITAL SIGNS: Temp:  [99 F (37.2 C)-101 F (38.3 C)] 99.8 F (37.7 C) (01/04 1100) Pulse Rate:  [74-114] 96  (01/04 1100) Resp:  [12-25] 24  (01/04 1100) BP: (115-172)/(46-98) 139/64 mmHg (01/04 1100) SpO2:  [98 %-100 %] 100 % (01/04 1100) FiO2 (%):  [40 %] 40 % (01/04 1235) Weight:  [113.1 kg (249 lb 5.4 oz)] 113.1 kg (249 lb 5.4 oz) (01/04 0100) HEMODYNAMICS:   VENTILATOR SETTINGS: Vent Mode:  [-] CPAP;PSV FiO2 (%):  [40 %] 40 % Set Rate:  [24 bmp] 24 bmp Vt Set:  [540 mL] 540 mL PEEP:  [5 cmH20] 5 cmH20 Pressure Support:  [10 cmH20] 10 cmH20 Plateau Pressure:  [24 [id number] cmH20] 28 cmH20 INTAKE / OUTPUT: Intake/Output      01/03 0701 - 01/04 0700 01/04 0701 - 01/05 0700   P.O.     I.V. (mL/kg) 1521.4 (13.5) 82.6 (0.7)   NG/GT 680 80   IV Piggyback 1850    Total Intake(mL/kg) 4051.4 (35.8) 162.6 (1.4)   Urine (mL/kg/hr) 2850 (1) 500 (0.7)   Total Output 2850 500   Net +1201.4 -337.4          PHYSICAL EXAMINATION: General:  Vent,  no distress Neuro:  PERR , follows commands HEENT:  Mucous membranes dry. Cardiovascular:  RRR, s1 s2  No m, int brady Lungs:  Coarse breath sounds bilateral, thick tan secretions from ett Abdomen:  Normoactive bowel sounds, distended but soft.tolerating tf Musculoskeletal:  + trace pretibial edema. Skin:  Warm, moist   LABS: Cbc  Lab 06/21/12 0530 06/20/12 0700 06/19/12 0610  WBC 14.2* -- --  HGB 10.3* 11.2* 11.7*  HCT 30.6* 34.2* 36.1*  PLT 222 221 225    Chemistry   Lab 06/21/12 0530 06/20/12 0700 06/19/12 0610  NA 141 134* 137  K 3.4* 4.9 3.6  CL 102 92* 95*  CO2 25 26 26   BUN 57* 45* 30*  CREATININE 2.16* 2.73* 1.47*  CALCIUM 7.9* 8.2* 9.0  MG -- -- --  PHOS -- 6.8* --  GLUCOSE 163* 313* 362*    Liver fxn  Lab 06/21/12 0530 06/20/12 0700  AST 30 --  ALT 19 --  ALKPHOS 60 --  BILITOT 0.2* --  PROT 6.7 --  ALBUMIN 2.6* 3.1*   coags  Lab 06/20/12 1620  APTT 39*  INR 1.14   Sepsis markers No results found for this basename: LATICACIDVEN:3,PROCALCITON:3 in the last 168 hours Cardiac markers  Lab 06/19/12 0610 06/19/12 0030  CKTOTAL -- --  CKMB -- --  TROPONINI <0.30 <0.30   BNP  Lab 06/20/12 0700 06/19/12 0240  PROBNP 3548.0* 1178.0*   ABG  Lab 06/20/12 1537 06/20/12 0956 06/20/12 0943 06/20/12 0700  PHART 7.361 7.297* -- 7.243*  PCO2ART 47.1* 47.7* -- 68.0*  PO2ART 108.0* 99.0 -- 413.0*  HCO3 26.6* 23.3 19.2* --  TCO2 28 25 21  --    CBG trend  Lab 06/20/12 2057 06/20/12 1954 06/20/12 1850 06/20/12 1743 06/20/12 1641  GLUCAP 183* 198* 167* 126* 116*    IMAGING:  CXR (06/18/2012) -   CT Angio Chest (06/18/2012) - No evidence of PE, 62mm nodule within LLL recommending 1 year followup chest CT, coronary calcifications.  CXR (06/20/2012) - Patchy right mid and lower lung zone airspace opacification, concerning for pneumonia; more mild left basilar airspace opacity also seen  CXR (06/20/2012) Post intubation - 1. Endotracheal tube seen ending  3-4 cm above the carina. 2. Persistent multifocal pneumonia, perhaps slightly worsened on the right side. CxR 1-4: 1. Increase in patchy airspace disease in the inferior right upper  lobe.  2. Little or no significant change in bibasilar airspace   DIAGNOSES: Principal Problem:  *H1N1 influenza with pneumonia Active Problems:  HYPERTENSION  Hypoxia  Diabetes mellitus  Chronic kidney disease  Hard of hearing  Acute respiratory failure   ASSESSMENT / PLAN:  PULMONARY  ASSESSMENT:  1) VDRF - 2/2 H1N1 and possible bibasilar and with possible superimposed bacterial pneumonia (CAP), possible developing ARDS. Intubated on 06/20/2012. 2) LLL 85mm pulmonary nodule - noted on CT angiogram, will require 1 year follow chest CT in setting of prior tobacco abuse. PLAN:   -ABg reviewed, improved ph -keep current MV -on min fio2 / support -pcxr in am  -cpap 5ps 5, goal 2 hrs, failed at 1.5 hrs -follow up to hazziness rul important on pcxr, may need CT chest  CARDIOVASCULAR  ASSESSMENT:  1) Hypertension - blood pressures stable at present. On home indapamide (thiazide diuretic) and Cardizem. Has been borderline 2) Hyperlipidemia - on home simvastatin. Will be held while intubated. PLAN:  - Continue to monitor.  - Check 2-D echocardiogram ((grade 1 diastolic dysfunction) With nl ef Pro bnp 3548 -consider diuresis, if BP allows, unlikely today  RENAL  ASSESSMENT:   1) CKD (unknown baseline). Admission SCr 1.6, now ARF 2) BPH - on home finasteride. Held while intubated. hypokalemia PLAN:   - renal US assess hydro, h/o BPH. Non definitive test. -allow pos balance for now -k sup  GASTROINTESTINAL  ASSESSMENT:  Concern constipation PLAN:   Add coalce StarT Tf and observe CURRENTLY @40  CC/HR pepcid  HEMATOLOGIC  Basename 06/21/12 0530 06/20/12 0700  HGB 10.3* 11.2*   Lab Results  Component Value Date   INR 1.14 06/20/2012     ASSESSMENT:  1) Macrocytic anemia, baseline  Hgb unknown. Admission Hgb 12.1, unclear cause. Patient is also on fereheme at home, for unclear reason. PLAN:  -obtain coags, see ABOVE  Cbc in am 1-5  Sub q hep ok  INFECTIOUS  ASSESSMENT:  1) H1N1 positive  2) CXR concerning for multifocal right pneumonia, CAP. PLAN:   - Continue tamilfu high dose 150 qday - Continue Levaquin IV. -Rocephin and Vancomycin per pharmacy -bands noted, r/o superinfection In am if culture neg, will dc vanc  ENDOCRINE CBG (last 3)   Basename 06/20/12 2057 06/20/12 1954 06/20/12 1850  GLUCAP 183* 198* 167*     ASSESSMENT:  1) DM2, unknown baseline A1c.  On home oral hypoglycemics in addition to TID humalog. PLAN:   - Check A1c. 7.4 on 1-3 - ICU hyperglycemia protocol, on insulin drip  NEUROLOGIC  ASSESSMENT:  1) Acute encephalopathy secondary to acute respiratory failure. 1-4 awake and follows commands PLAN:   rass goal -1 fent prn Chair position   CLINICAL SUMMARY:  Mr. CORON GRASER is a 79 y.o. male with a PMHX of DM2, CKD, HTN who was admitted to [facility] on 06/19/2012 with acute respiratory failure secondary to H1N1 and multifocal CAP, requiring intubation. 1-4 awake and stable and on weaning protocol but no extubation.   Richardson Landry Minor ACNP Maryanna Shape PCCM Pager [phone] till 3 pm If no answer page [phone] 06/21/2012, 12:58 PM  I have personally obtained a history, examined the patient, evaluated laboratory and imaging results, formulated the assessment and plan and placed orders.  CRITICAL CARE: The patient is critically ill with multiple organ systems failure and requires high complexity decision making for assessment and support, frequent evaluation and titration of therapies, application of advanced monitoring technologies and extensive interpretation of multiple databases. Critical Care Time devoted to patient care services described in this note is 30 minutes.    Lavon Paganini. [name], MD, [place] Pgr: [facility]

## 2012-06-22 ENCOUNTER — Inpatient Hospital Stay (HOSPITAL_COMMUNITY): Payer: Medicare PPO

## 2012-06-22 DIAGNOSIS — J1000 Influenza due to other identified influenza virus with unspecified type of pneumonia: Principal | ICD-10-CM

## 2012-06-22 LAB — GLUCOSE, CAPILLARY: Glucose-Capillary: 192 mg/dL — ABNORMAL HIGH (ref 70–99)

## 2012-06-22 LAB — BLOOD GAS, ARTERIAL
Drawn by: 252031
O2 Saturation: 97 %
PEEP: 5 cmH2O
Patient temperature: 99.4
RATE: 24 resp/min

## 2012-06-22 LAB — CBC
HCT: 32.6 % — ABNORMAL LOW (ref 39.0–52.0)
Hemoglobin: 10.6 g/dL — ABNORMAL LOW (ref 13.0–17.0)
MCH: 32.2 pg (ref 26.0–34.0)
RBC: 3.29 MIL/uL — ABNORMAL LOW (ref 4.22–5.81)

## 2012-06-22 LAB — BASIC METABOLIC PANEL
CO2: 26 mEq/L (ref 19–32)
Calcium: 8.7 mg/dL (ref 8.4–10.5)
Glucose, Bld: 207 mg/dL — ABNORMAL HIGH (ref 70–99)
Sodium: 145 mEq/L (ref 135–145)

## 2012-06-22 LAB — CULTURE, RESPIRATORY W GRAM STAIN

## 2012-06-22 MED ORDER — OSELTAMIVIR PHOSPHATE 6 MG/ML PO SUSR
150.0000 mg | Freq: Two times a day (BID) | ORAL | Status: DC
  Administered 2012-06-22 – 2012-06-27 (×11): 150 mg
  Filled 2012-06-22 (×11): qty 25

## 2012-06-22 MED ORDER — POTASSIUM PHOSPHATE DIBASIC 3 MMOLE/ML IV SOLN
10.0000 meq | Freq: Once | INTRAVENOUS | Status: AC
  Administered 2012-06-22: 10 meq via INTRAVENOUS
  Filled 2012-06-22: qty 2.27

## 2012-06-22 MED ORDER — POTASSIUM CHLORIDE 20 MEQ PO PACK
40.0000 meq | PACK | Freq: Once | ORAL | Status: DC
  Filled 2012-06-22: qty 2

## 2012-06-22 MED ORDER — DILTIAZEM 12 MG/ML ORAL SUSPENSION
60.0000 mg | Freq: Four times a day (QID) | ORAL | Status: DC
  Administered 2012-06-22 – 2012-06-23 (×5): 60 mg
  Filled 2012-06-22 (×8): qty 6

## 2012-06-22 MED ORDER — INSULIN GLARGINE 100 UNIT/ML ~~LOC~~ SOLN
40.0000 [IU] | Freq: Every day | SUBCUTANEOUS | Status: DC
  Administered 2012-06-22: 40 [IU] via SUBCUTANEOUS

## 2012-06-22 MED ORDER — INSULIN ASPART 100 UNIT/ML ~~LOC~~ SOLN
0.0000 [IU] | SUBCUTANEOUS | Status: DC
  Administered 2012-06-22: 8 [IU] via SUBCUTANEOUS
  Administered 2012-06-23: 5 [IU] via SUBCUTANEOUS
  Administered 2012-06-23 (×5): 3 [IU] via SUBCUTANEOUS
  Administered 2012-06-24: 2 [IU] via SUBCUTANEOUS
  Administered 2012-06-24 (×2): 5 [IU] via SUBCUTANEOUS
  Administered 2012-06-24: 3 [IU] via SUBCUTANEOUS
  Administered 2012-06-24: 5 [IU] via SUBCUTANEOUS
  Administered 2012-06-24 (×2): 3 [IU] via SUBCUTANEOUS
  Administered 2012-06-25: 2 [IU] via SUBCUTANEOUS
  Administered 2012-06-25: 3 [IU] via SUBCUTANEOUS
  Administered 2012-06-25 (×2): 5 [IU] via SUBCUTANEOUS
  Administered 2012-06-25: 2 [IU] via SUBCUTANEOUS
  Administered 2012-06-26 (×2): 8 [IU] via SUBCUTANEOUS
  Administered 2012-06-26: 11 [IU] via SUBCUTANEOUS
  Administered 2012-06-26 (×2): 8 [IU] via SUBCUTANEOUS
  Administered 2012-06-26: 11 [IU] via SUBCUTANEOUS
  Administered 2012-06-27: 15 [IU] via SUBCUTANEOUS
  Administered 2012-06-27: 3 [IU] via SUBCUTANEOUS
  Administered 2012-06-27: 11 [IU] via SUBCUTANEOUS
  Administered 2012-06-27: 3 [IU] via SUBCUTANEOUS
  Administered 2012-06-27 (×2): 15 [IU] via SUBCUTANEOUS
  Administered 2012-06-28 (×2): 11 [IU] via SUBCUTANEOUS
  Administered 2012-06-28 (×2): 8 [IU] via SUBCUTANEOUS
  Administered 2012-06-28: 3 [IU] via SUBCUTANEOUS
  Administered 2012-06-28 – 2012-06-29 (×2): 8 [IU] via SUBCUTANEOUS
  Administered 2012-06-29: 5 [IU] via SUBCUTANEOUS
  Administered 2012-06-29: 2 [IU] via SUBCUTANEOUS
  Administered 2012-06-29: 13 [IU] via SUBCUTANEOUS
  Administered 2012-06-29: 11 [IU] via SUBCUTANEOUS

## 2012-06-22 MED ORDER — FUROSEMIDE 10 MG/ML IJ SOLN
40.0000 mg | Freq: Once | INTRAMUSCULAR | Status: AC
  Administered 2012-06-22: 40 mg via INTRAVENOUS
  Filled 2012-06-22: qty 4

## 2012-06-22 MED ORDER — POTASSIUM CHLORIDE 20 MEQ/15ML (10%) PO LIQD
40.0000 meq | Freq: Once | ORAL | Status: AC
  Administered 2012-06-22: 40 meq
  Filled 2012-06-22: qty 30

## 2012-06-22 MED ORDER — LEVOFLOXACIN IN D5W 750 MG/150ML IV SOLN
750.0000 mg | INTRAVENOUS | Status: DC
  Filled 2012-06-22: qty 150

## 2012-06-22 MED ORDER — INSULIN ASPART 100 UNIT/ML ~~LOC~~ SOLN
10.0000 [IU] | SUBCUTANEOUS | Status: DC
  Administered 2012-06-22 – 2012-06-23 (×5): 10 [IU] via SUBCUTANEOUS

## 2012-06-22 MED ORDER — INSULIN ASPART 100 UNIT/ML ~~LOC~~ SOLN
0.0000 [IU] | Freq: Three times a day (TID) | SUBCUTANEOUS | Status: DC
  Administered 2012-06-22 (×2): 5 [IU] via SUBCUTANEOUS

## 2012-06-22 NOTE — Progress Notes (Signed)
CRITICAL VALUE ALERT  Critical value received: METHICILLIN RESISTANT STAPHYLOCOCCUS AUREUS in tracheal aspirate   Date of notification:  06/22/12 Time of notification: 2035 hrs Critical value read back:yes  Nurse who received alert: Janann August    MD notified : Dr Elsworth Soho in [place]: Pt already on antiinfectives, will continue to monitor.MD acknowleged

## 2012-06-22 NOTE — Progress Notes (Signed)
PULMONARY  / CRITICAL CARE MEDICINE (Resident Note)  Name: Clayton [name] MRN: [id number] DOB: [birth year]    LOS: 61  REFERRING MD :  [facility]  CHIEF COMPLAINT:  Shortness of breath and chest pain  BRIEF PATIENT DESCRIPTION: Clayton [name] is a 79 y.o. male with a PMHX of DM2, CKD, HTN who was admitted to [facility] on 06/19/2012 with acute respiratory failure secondary to H1N1 and requiring intubation on 06/22/2012.  LINES / TUBES: ETT 01/03 >> PIV left x 3 1/2-1/3  CULTURES: Influenza Panel A 06/19/2012 H1N1 POSITIVE 1/2 MRSA nares + 1/2  Urine Cx negative  bld cultures 1/3 NTD, pending Resp Cx 1/3 + MRSA tracheal aspirate   ANTIBIOTICS: Azithromycin 1/2 > 1/3 Levaquin 1/3 >>1/5 Tamiflu 1/2 >> vanc 1/3>>> ceftriaxone 1/3>>>1/5  SIGNIFICANT EVENTS:  06/21/2011 -  Patient developed acute respiratory failure requiring ETT 1/3- broderline BP 1/4-no overnight events except diarrhea  1/5- mrsa noted  LEVEL OF CARE:  ICU PRIMARY SERVICE:  PCCM CONSULTANTS:  None CODE STATUS: full DIET:  TF 1/3>>> DVT Px: hep sub q  GI Px:  Pepcid  INTERVAL HISTORY:  01/03 AM ETT, borderline BP 1-4 awake and weaning. Weaned 1.5 hours 1/4  1/4- pos balance  VITAL SIGNS: Temp:  [99 F (37.2 C)-100 F (37.8 C)] 99.4 F (37.4 C) (01/05 0800) Pulse Rate:  [88-113] 97  (01/05 0800) Resp:  [13-30] 19  (01/05 0800) BP: (131-187)/(50-82) 165/69 mmHg (01/05 0800) SpO2:  [97 %-100 %] 98 % (01/05 0800) FiO2 (%):  [30 %-40 %] 30 % (01/05 0800) Weight:  [243 lb 13.3 oz (110.6 kg)] 243 lb 13.3 oz (110.6 kg) (01/05 0500) HEMODYNAMICS:   VENTILATOR SETTINGS: Vent Mode:  [-] PRVC FiO2 (%):  [30 %-40 %] 30 % Set Rate:  [24 bmp] 24 bmp Vt Set:  [540 mL] 540 mL PEEP:  [5 cmH20] 5 cmH20 Pressure Support:  [10 cmH20-14 cmH20] 14 cmH20 Plateau Pressure:  [19 cmH20-25 cmH20] 24 cmH20 INTAKE / OUTPUT: Intake/Output      01/04 0701 - 01/05 0700 01/05 0701 - 01/06 0700   I.V. (mL/kg)  392.6 (3.5)    Other 120    NG/GT 1160    IV Piggyback 700    Total Intake(mL/kg) 2372.6 (21.5)    Urine (mL/kg/hr) 4075 (1.5) 360   Total Output 4075 360   Net -1702.4 -360        Stool Occurrence 4 x      PHYSICAL EXAMINATION: General:  ETT intact, nad, Neuro: follows commands HEENT:  [place]/at Cardiovascular:  Slight tachy (low 100s), no murmurs  Lungs:  Crackles b/l, min. Wheezes left lung field, using ab accessory muscles  Abdomen:  Obese ab, ntnd, normal bs Musculoskeletal:  + trace edema b/l lower ext. Skin:  Warm, no cyanosis   LABS: Cbc  Lab 06/22/12 0510 06/21/12 0530 06/20/12 0700  WBC 14.1* -- --  HGB 10.6* 10.3* 11.2*  HCT 32.6* 30.6* 34.2*  PLT 257 222 221    Chemistry   Lab 06/22/12 0510 06/21/12 0530 06/20/12 0700  NA 145 141 134*  K 3.4* 3.4* 4.9  CL 107 102 92*  CO2 26 25 26   BUN 48* 57* 45*  CREATININE 1.38* 2.16* 2.73*  CALCIUM 8.7 7.9* 8.2*  MG 2.7* -- --  PHOS 2.0* -- 6.8*  GLUCOSE 207* 163* 313*    Liver fxn  Lab 06/21/12 0530 06/20/12 0700  AST 30 --  ALT 19 --  ALKPHOS 60 --  BILITOT 0.2* --  PROT 6.7 --  ALBUMIN 2.6* 3.1*   coags  Lab 06/20/12 1620  APTT 39*  INR 1.14   Cardiac markers  Lab 06/19/12 0610 06/19/12 0030  CKTOTAL -- --  CKMB -- --  TROPONINI <0.30 <0.30   BNP  Lab 06/20/12 0700 06/19/12 0240  PROBNP 3548.0* 1178.0*   ABG  Lab 06/22/12 0458 06/20/12 1537 06/20/12 0956  PHART 7.420 7.361 7.297*  PCO2ART 44.4 47.1* 47.7*  PO2ART 87.1 108.0* 99.0  HCO3 28.1* 26.6* 23.3  TCO2 29.4 28 25     CBG trend  Lab 06/22/12 0410 06/21/12 1949 06/21/12 1520 06/21/12 1121 06/21/12 1025  GLUCAP 192* 227* 112* 146* 148*    IMAGING:  CT Angio Chest (06/18/2012) - No evidence of PE, 38mm nodule within LLL recommending 1 year followup chest CT, coronary calcifications.  CXR (06/22/2012) - Patchy bilateral pulmonary infiltrates, slightly improved. Slight pulm vasc congestion.  Mild bronchitic changes.  No pleural  effusion or pneumothorax   DIAGNOSES: Principal Problem:  *H1N1 influenza with pneumonia Active Problems:  HYPERTENSION  Hypoxia  Diabetes mellitus  Chronic kidney disease  Hard of hearing  Acute respiratory failure   ASSESSMENT / PLAN:  PULMONARY 06/22/12 CXR: improved aeration in rt infiltrates, ett wnl   ASSESSMENT:  1) VDRF - 2/2 H1N1 + and influenza A +. Possible superimposed pneumonia (CAP), Intubated on 06/20/2012, no ARDS paO2/FiO2 ratio 290 (>200). Attempted to wean ETT 1/4 weaned for 1.5 hours. Using abdominal accessory muscles this am 2) LLL 28mm pulmonary nodule - noted on CT angiogram, will require 1 year follow chest CT in setting of prior tobacco abuse. 3) Slight pulmonary vasc congestion 4) Blood tinged secretions with suction, plts normal  5) +MRSA tracheal aspirate 1/3   PLAN:   -last ABG reviewed, reduce rate 18 -try to wean today, PS 10, goal to 8 if able -Lasix 40 mg iv x 1  -serial CXR in the am -Cont. Vancomycin  CARDIOVASCULAR 1/3 echo EF 0000000 grade 1 diastolic dysfuntion  ASSESSMENT:  1) Hypertension - hypertensive. On home indapamide (thiazide diuretic) and Cardizem.  2) Hyperlipidemia - on home simvastatin. Will be held while intubated.  PLAN:  - Continue to monitor. Resumed Diltiazem (home med just not extended release 60 mg q6)-He was taking 360 mg XR at home -trend pro BNP in am, lasix x 1 as below -Lasix 40 mg iv x 1  RENAL ASSESSMENT:   1) acute on CKD (unknown baseline). Admission SCr 1.6-->2.73-->1.38 this am. Renal US w/o hydronephrosis, possible renal cysts   2) BPH - on home finasteride. Held while intubated. 3) hypokalemia 4) hypophos  PLAN:   -Consider outpatient MRI renal -40 meq K per tube  -Kphos 10 meQ iv  Lasix to even balance  GASTROINTESTINAL Loose stool  Nutrition GI px   ASSESSMENT:   Likely due to antibiotics or colace, no cultures sent Nutrition GI px   PLAN:   Opexa, Prostat  pepcid Dc  colace  HEMATOLOGIC  Basename 06/22/12 0510 06/21/12 0530  HGB 10.6* 10.3*   Lab Results  Component Value Date   INR 1.14 06/20/2012     ASSESSMENT:   1) Normocytic anemia-baseline Hgb unknown. Admission Hgb 12.1, unclear cause. Patient is also on fereheme at home, for unclear reason.H/H trended down since admission could be dilutional 2) DVT px   PLAN:   Trend cbc  Sub q hep  INFECTIOUS ASSESSMENT:   1) H1N1 positive and Influenza A +   2)  CXR concerning for multifocal right pneumonia, CAP. 3) +MRSA tracheal aspirate 1/3   PLAN:   - Continue tamilfu high dose 150 qd (total days Tamiflu Day 4) -Continue Vanc, low threshold change to linaozlid -dc levo, ceftraixone -Bld cultures NTD and pending  ENDOCRINE CBG (last 3)   Basename 06/22/12 0410 06/21/12 1949 06/21/12 1520  GLUCAP 192* 227* 112*     ASSESSMENT:   1) DM2, unknown baseline A1c now 7.4% still hyperglycemia.  On home oral hypoglycemics in addition to TID humalog. PLAN:   1)SSI sensitive changed to moderate scale, Lantus 40 units q24   NEUROLOGIC  ASSESSMENT:   1) Acute encephalopathy secondary to acute respiratory failure, resolved   PLAN:   rass goal 1 Monitor mental status  CLINICAL SUMMARY:  He has vascular congestion will give iv Lasix. Trying to wean vent but using accessory muscles this am, no ARDS, +H1N1 and influenza A + possible superimposed CAP with mrsa , get BP better controlled, improved renal fxn  Karlyn Agee MD PGY 1 IM [phone]  I have fully examined this patient and agree with above findings.    And edited in full Ccm time 30 min   Lavon Paganini. [name], MD, [place] Pgr: [facility]

## 2012-06-22 NOTE — Progress Notes (Signed)
ANTIBIOTIC CONSULT NOTE - Follow-up  Pharmacy Consult for vancomycin + levaquin + ceftriaxone Indication: severe CAP  Allergies  Allergen Reactions  . Penicillins     Patient Measurements: Height: 6' (182.9 cm) Weight: 243 lb 13.3 oz (110.6 kg) IBW/kg (Calculated) : 77.6   Vital Signs: Temp: 99.7 F (37.6 C) (01/05 1000) Temp src: Core (Comment) (01/05 0800) BP: 175/68 mmHg (01/05 1000) Pulse Rate: 101  (01/05 1000)  Labs:  Basename 06/22/12 0510 06/21/12 0530 06/20/12 0700  WBC 14.1* 14.2* 14.6*  HGB 10.6* 10.3* 11.2*  PLT 257 222 221  LABCREA -- -- --  CREATININE 1.38* 2.16* 2.73*   Estimated Creatinine Clearance: 53 ml/min (by C-G formula based on Cr of 1.38).   Microbiology: Recent Results (from the past 720 hour(s))  MRSA PCR SCREENING     Status: Abnormal   Collection Time   06/20/12  6:03 AM      Component Value Range Status Comment   MRSA by PCR POSITIVE (*) NEGATIVE Final   URINE CULTURE     Status: Normal   Collection Time   06/20/12  6:03 AM      Component Value Range Status Comment   Specimen Description URINE, CATHETERIZED   Final    Special Requests NONE   Final    Culture  Setup Time 06/20/2012 06:32   Final    Colony Count NO GROWTH   Final    Culture NO GROWTH   Final    Report Status 06/21/2012 FINAL   Final   CULTURE, BLOOD (ROUTINE X 2)     Status: Normal (Preliminary result)   Collection Time   06/20/12  7:00 AM      Component Value Range Status Comment   Specimen Description BLOOD RIGHT ANTECUBITAL   Final    Special Requests BOTTLES DRAWN AEROBIC ONLY 5CC   Final    Culture  Setup Time 06/20/2012 10:39   Final    Culture     Final    Value:        BLOOD CULTURE RECEIVED NO GROWTH TO DATE CULTURE WILL BE HELD FOR 5 DAYS BEFORE ISSUING A FINAL NEGATIVE REPORT   Report Status PENDING   Incomplete   CULTURE, RESPIRATORY     Status: Normal (Preliminary result)   Collection Time   06/20/12  7:06 AM      Component Value Range Status Comment   Specimen Description TRACHEAL ASPIRATE   Final    Special Requests NONE   Final    Gram Stain     Final    Value: RARE WBC PRESENT, PREDOMINANTLY PMN     NO SQUAMOUS EPITHELIAL CELLS SEEN     FEW GRAM POSITIVE COCCI IN PAIRS   Culture     Final    Value: MODERATE METHICILLIN RESISTANT STAPHYLOCOCCUS AUREUS     Note: RIFAMPIN AND GENTAMICIN SHOULD NOT BE USED AS SINGLE DRUGS FOR TREATMENT OF STAPH INFECTIONS. This organism is presumed to be Clindamycin resistant based on detection of inducible Clindamycin resistance.   Report Status PENDING   Incomplete    Organism ID, Bacteria METHICILLIN RESISTANT STAPHYLOCOCCUS AUREUS   Final   CULTURE, BLOOD (ROUTINE X 2)     Status: Normal (Preliminary result)   Collection Time   06/20/12  7:10 AM      Component Value Range Status Comment   Specimen Description BLOOD RIGHT ARM   Final    Special Requests BOTTLES DRAWN AEROBIC ONLY 5CC   Final  Culture  Setup Time 06/20/2012 10:39   Final    Culture     Final    Value:        BLOOD CULTURE RECEIVED NO GROWTH TO DATE CULTURE WILL BE HELD FOR 5 DAYS BEFORE ISSUING A FINAL NEGATIVE REPORT   Report Status PENDING   Incomplete    Assessment: 79yo male admitted with SOB currently being treated with vancomycin + ceftriaxone + levaquin for severe CAP. Tmax is 100, WBC is elevated at 14.1. Renal function is improving and CrCl is now ~106ml/min. Pt has MRSA growing in his lungs and is flu positive.   1/2 >> Azithromycin >> 1/2 1/2 >> vanc >> 1/2 >> tamiflu >>  1/3 >> levofloxacin >>  1/3>>CTX>>  1/2 Blood - NGTD 1/3 Urine - NEG 1/2 Sputum - MRSA (Vanc MIC = 1) MRSA screen + H1N1 +, Infuenza A +  Goal of Therapy:  Vancomycin trough level 15-20 mcg/ml  Plan:  1. Change tamiflu to Q12H 2. Clarify LOT for tamiflu - 5 vs 10 days 3. Continue ceftriaxone 1gm IV Q24H 4. Change levaquin to 750mg  IV Q24H 5. Continue vancomycin 1500mg  IV Q24H 6. F/u renal fxn, C&S, clinical status and trough at  [facility], PharmD, BCPS Pager # [phone] 06/22/2012 12:06 PM

## 2012-06-23 ENCOUNTER — Inpatient Hospital Stay (HOSPITAL_COMMUNITY): Payer: Medicare PPO

## 2012-06-23 LAB — GLUCOSE, CAPILLARY
Glucose-Capillary: 155 mg/dL — ABNORMAL HIGH (ref 70–99)
Glucose-Capillary: 183 mg/dL — ABNORMAL HIGH (ref 70–99)
Glucose-Capillary: 213 mg/dL — ABNORMAL HIGH (ref 70–99)

## 2012-06-23 LAB — CBC WITH DIFFERENTIAL/PLATELET
Basophils Absolute: 0.5 10*3/uL — ABNORMAL HIGH (ref 0.0–0.1)
Basophils Relative: 3 % — ABNORMAL HIGH (ref 0–1)
Eosinophils Relative: 1 % (ref 0–5)
HCT: 33.6 % — ABNORMAL LOW (ref 39.0–52.0)
Lymphs Abs: 1.5 10*3/uL (ref 0.7–4.0)
MCV: 100.9 fL — ABNORMAL HIGH (ref 78.0–100.0)
Monocytes Relative: 10 % (ref 3–12)
Neutro Abs: 12.9 10*3/uL — ABNORMAL HIGH (ref 1.7–7.7)
RDW: 13.5 % (ref 11.5–15.5)
WBC: 16.8 10*3/uL — ABNORMAL HIGH (ref 4.0–10.5)

## 2012-06-23 LAB — BASIC METABOLIC PANEL
BUN: 54 mg/dL — ABNORMAL HIGH (ref 6–23)
GFR calc non Af Amer: 40 mL/min — ABNORMAL LOW (ref >90)
Glucose, Bld: 168 mg/dL — ABNORMAL HIGH (ref 70–99)
Potassium: 3.4 mEq/L — ABNORMAL LOW (ref 3.5–5.1)

## 2012-06-23 LAB — CLOSTRIDIUM DIFFICILE BY PCR: Toxigenic C. Difficile by PCR: NEGATIVE

## 2012-06-23 MED ORDER — INSULIN ASPART 100 UNIT/ML ~~LOC~~ SOLN
4.0000 [IU] | Freq: Three times a day (TID) | SUBCUTANEOUS | Status: DC
  Administered 2012-06-23 – 2012-06-24 (×3): 4 [IU] via SUBCUTANEOUS

## 2012-06-23 MED ORDER — DILTIAZEM 12 MG/ML ORAL SUSPENSION
90.0000 mg | Freq: Four times a day (QID) | ORAL | Status: DC
  Administered 2012-06-23 – 2012-06-29 (×23): 90 mg
  Filled 2012-06-23 (×35): qty 9

## 2012-06-23 MED ORDER — POTASSIUM CHLORIDE 20 MEQ/15ML (10%) PO LIQD
40.0000 meq | Freq: Two times a day (BID) | ORAL | Status: AC
  Administered 2012-06-23 (×2): 40 meq
  Filled 2012-06-23 (×2): qty 30

## 2012-06-23 MED ORDER — INSULIN GLARGINE 100 UNIT/ML ~~LOC~~ SOLN
45.0000 [IU] | Freq: Every day | SUBCUTANEOUS | Status: DC
  Administered 2012-06-23 – 2012-06-25 (×3): 45 [IU] via SUBCUTANEOUS

## 2012-06-23 MED ORDER — LINEZOLID 2 MG/ML IV SOLN
600.0000 mg | Freq: Two times a day (BID) | INTRAVENOUS | Status: DC
  Administered 2012-06-23 – 2012-06-29 (×13): 600 mg via INTRAVENOUS
  Filled 2012-06-23 (×14): qty 300

## 2012-06-23 MED ORDER — PRO-STAT SUGAR FREE PO LIQD
30.0000 mL | Freq: Four times a day (QID) | ORAL | Status: DC
  Administered 2012-06-23 – 2012-06-26 (×12): 30 mL
  Filled 2012-06-23 (×14): qty 30

## 2012-06-23 MED ORDER — DEXTROSE 5 % IV SOLN
INTRAVENOUS | Status: DC
  Administered 2012-06-23: 14:00:00 via INTRAVENOUS

## 2012-06-23 MED ORDER — DEXMEDETOMIDINE HCL IN NACL 200 MCG/50ML IV SOLN
0.2000 ug/kg/h | INTRAVENOUS | Status: DC
  Administered 2012-06-23: 0.4 ug/kg/h via INTRAVENOUS
  Administered 2012-06-23 – 2012-06-24 (×2): 0.5 ug/kg/h via INTRAVENOUS
  Administered 2012-06-24: 0.7 ug/kg/h via INTRAVENOUS
  Administered 2012-06-24 (×2): 0.8 ug/kg/h via INTRAVENOUS
  Administered 2012-06-24: 0.599 ug/kg/h via INTRAVENOUS
  Administered 2012-06-24 (×2): 0.5 ug/kg/h via INTRAVENOUS
  Administered 2012-06-25 (×2): 0.7 ug/kg/h via INTRAVENOUS
  Filled 2012-06-23 (×11): qty 50
  Filled 2012-06-23: qty 100
  Filled 2012-06-23 (×4): qty 50

## 2012-06-23 MED ORDER — FENTANYL CITRATE 0.05 MG/ML IJ SOLN
100.0000 ug | INTRAMUSCULAR | Status: AC
  Administered 2012-06-23: 100 ug via INTRAVENOUS

## 2012-06-23 MED ORDER — ACETAMINOPHEN 160 MG/5ML PO SOLN: 650.0000 mg | Freq: Four times a day (QID) | ORAL | Status: DC | PRN

## 2012-06-23 MED ORDER — ALBUTEROL SULFATE HFA 108 (90 BASE) MCG/ACT IN AERS
6.0000 | INHALATION_SPRAY | RESPIRATORY_TRACT | Status: DC
  Administered 2012-06-23 – 2012-06-26 (×19): 6 via RESPIRATORY_TRACT
  Filled 2012-06-23 (×2): qty 6.7

## 2012-06-23 MED ORDER — VITAL AF 1.2 CAL PO LIQD
1000.0000 mL | ORAL | Status: DC
  Administered 2012-06-23: 1000 mL
  Filled 2012-06-23 (×7): qty 1000

## 2012-06-23 MED ORDER — VANCOMYCIN 50 MG/ML ORAL SOLUTION
125.0000 mg | Freq: Four times a day (QID) | ORAL | Status: DC
  Administered 2012-06-23 (×3): 125 mg via ORAL
  Filled 2012-06-23 (×7): qty 2.5

## 2012-06-23 MED ORDER — FREE WATER: 100.0000 mL | Freq: Three times a day (TID) | Status: DC

## 2012-06-23 MED ORDER — FREE WATER
200.0000 mL | Freq: Three times a day (TID) | Status: DC
  Administered 2012-06-23 – 2012-06-25 (×7): 200 mL

## 2012-06-23 MED ORDER — IPRATROPIUM BROMIDE HFA 17 MCG/ACT IN AERS: 6.0000 | INHALATION_SPRAY | RESPIRATORY_TRACT | Status: DC | PRN

## 2012-06-23 MED ORDER — METRONIDAZOLE IN NACL 5-0.79 MG/ML-% IV SOLN: 500.0000 mg | Freq: Three times a day (TID) | INTRAVENOUS | Status: DC

## 2012-06-23 MED ORDER — MIDAZOLAM HCL 2 MG/2ML IJ SOLN
2.0000 mg | INTRAMUSCULAR | Status: AC
  Administered 2012-06-23: 2 mg via INTRAVENOUS

## 2012-06-23 NOTE — Progress Notes (Signed)
Pt did not tolerate wean. RT turned PS to 20, pt was SOB and had increased work of breathing.

## 2012-06-23 NOTE — Evaluation (Signed)
[facility] [phone] (pager)

## 2012-06-23 NOTE — Progress Notes (Signed)
Rehab Admissions Coordinator Note:  Patient was screened by Retta Diones for appropriateness for an Inpatient Acute Rehab Consult.  At this time, we are recommending [facility].  Noted PT recommending inpatient rehab consult.  Patient has [facility] and they will not approve an inpatient rehab admission given patient current diagnosis.  Call me for questions.  Retta Diones 06/23/2012, 2:48 PM  I can be reached at [phone].

## 2012-06-23 NOTE — Progress Notes (Signed)
NUTRITION FOLLOW UP  Intervention:    Change TF to Vital AF 1.2 at 40 ml/h, increase by 10 ml every 4 hours to goal rate of 55 ml/h with Prostat 30 ml QID to provide 1984 kcals (24.5 kcals/kg ideal weight), 159 gm protein, 1071 ml free water daily.  Nutrition Dx:   Inadequate oral intake related to inability to eat as evidenced by NPO status, ongoing.   Goal:   Enteral nutrition to provide 60-70% of estimated calorie needs (22-25 kcals/kg ideal body weight) and >/= 90% of estimated protein needs, based on ASPEN guidelines for permissive underfeeding in critically ill obese individuals, met.  Monitor:   TF tolerance/adequacy, weight trend, labs, I/O  Assessment:   Received consult to change TF formula to Vital due to increased diarrhea.  Per discussion with CCM rounding team, patient is having a lot of diarrhea and his glucose is elevated.  Diarrhea may be related to flu.  Patient remains intubated on ventilator support.  MV: 11.2 Temp:Temp (24hrs), Avg:99.1 F (37.3 C), Min:97.4 F (36.3 C), Max:100 F (37.8 C)   Height: Ht Readings from Last 1 Encounters:  06/20/12 6' (1.829 m)    Weight Status:   Wt Readings from Last 1 Encounters:  06/23/12 236 lb 15.9 oz (107.5 kg)  1/3 244 lb  Re-estimated needs:  Kcal: 2150 Protein: >160 gm Fluid: 2.1-1.3 L  Skin: no problems noted  Diet Order: NPO; TF via OG tube:  Oxepa at 40 ml/h with Prostat 60 ml TID providing 2040 kcals (25 kcals/kg ideal weight), 150 gm protein (94% estimated needs), 754 ml free water daily.    Intake/Output Summary (Last 24 hours) at 06/23/12 1407 Last data filed at 06/23/12 1046  Gross per 24 hour  Intake   1969 ml  Output   1590 ml  Net    379 ml    Last BM: 1/5   Labs:   Lab 06/23/12 0455 06/22/12 0510 06/21/12 0530 06/20/12 0700  NA 149* 145 141 --  K 3.4* 3.4* 3.4* --  CL 108 107 102 --  CO2 30 26 25  --  BUN 54* 48* 57* --  CREATININE 1.54* 1.38* 2.16* --  CALCIUM 9.5 8.7 7.9* --    MG 2.6* 2.7* -- --  PHOS 3.1 2.0* -- 6.8*  GLUCOSE 168* 207* 163* --    CBG (last 3)   Basename 06/23/12 1219 06/23/12 0747 06/23/12 0350  GLUCAP 213* 156* 162*    Scheduled Meds:   . albuterol  6 puff Inhalation Q4H  . antiseptic oral rinse  15 mL Mouth Rinse QID  . chlorhexidine  15 mL Mouth Rinse BID  . Chlorhexidine Gluconate Cloth  6 each Topical Q0600  . diltiazem  90 mg Per Tube Q6H  . famotidine (PEPCID) IV  20 mg Intravenous Q24H  . feeding supplement (OXEPA)  1,000 mL Per Tube Q24H  . feeding supplement  60 mL Per Tube TID  . free water  200 mL Per Tube Q8H  . heparin subcutaneous  5,000 Units Subcutaneous Q8H  . insulin aspart  0-15 Units Subcutaneous Q4H  . insulin aspart  4 Units Subcutaneous Q8H  . insulin glargine  45 Units Subcutaneous QHS  . linezolid  600 mg Intravenous Q12H  . mupirocin ointment  1 application Nasal BID  . oseltamivir  150 mg Per Tube BID  . potassium chloride  40 mEq Per Tube BID  . sodium chloride  3 mL Intravenous Q12H  . vancomycin  125  mg Oral QID    Continuous Infusions:   . dexmedetomidine    . dextrose 30 mL/hr at 06/23/12 [place], RD, LDN, [place] Pager# [phone] After Hours Pager# [phone]

## 2012-06-23 NOTE — Progress Notes (Signed)
Name: Clayton [name] MRN: [id number] DOB: 09-Oct-1927 LOS: 5  PCCM RESIDENT DAILY PROGRESS NOTE  History of Present Illness: [name] is a 79 y.o. male with a PMHX of DM2, CKD, HTN who was admitted to [facility] on 06/19/2012 with acute respiratory failure secondary to H1N1 and requiring intubation on 06/22/2012  Lines / Drains: ETT 01/03 >>  PIV left x 3 1/2-1/3  Cultures: Influenza Panel A 06/19/2012  H1N1 POSITIVE 1/2  MRSA nares + 1/2  Urine Cx negative  bld cultures 1/3 NTD, pending  Resp Cx 1/3 + MRSA tracheal aspirate   Antibiotics: Azithromycin 1/2 > 1/3  Levaquin 1/3 >>1/5  ceftriaxone 1/3>>>1/5 Tamiflu 1/2 >>  vanc 1/3>>>   Tests / Events: 06/21/2011 - Patient developed acute respiratory failure requiring ETT  1/3- broderline BP  1/4-no overnight events except diarrhea  1/5- mrsa noted   Overnight Events:  1/5 bigeminy on tele resolved  Vital Signs: Temp:  [97.4 F (36.3 C)-100 F (37.8 C)] 97.4 F (36.3 C) (01/06 1000) Pulse Rate:  [82-117] 103  (01/06 1000) Resp:  [9-28] 21  (01/06 1000) BP: (119-188)/(46-104) 183/74 mmHg (01/06 1000) SpO2:  [95 %-100 %] 97 % (01/06 1000) FiO2 (%):  [30 %-50 %] 30 % (01/06 0812) Weight:  [236 lb 15.9 oz (107.5 kg)] 236 lb 15.9 oz (107.5 kg) (01/06 0500) I/O last 3 completed shifts: In: 47 [I.V.:490; Other:30; NG/GT:1740; IV Piggyback:1502] Out: [id number] [Urine:5140]  Physical Examination: General:  Lying in bed, arousable, nad Neuro:  Follows commands, arousable, answers questions appropriately HEENT:  ETT intact with pink tinged thick tenacious secretions, [place]/at Cardiovascular:  RRR no murmurs Lungs:  Course breath sounds b/l Abdomen:  Using ab accesory muscles, nl bs, ntnd Musculoskeletal:  No cyanosis, edema lower ext, warm Skin:  intact  Ventilator settings: Vent Mode:  [-] PRVC FiO2 (%):  [30 %-50 %] 30 % Set Rate:  [18 bmp-24 bmp] 18 bmp Vt Set:  [540 mL] 540 mL PEEP:  [5 cmH20] 5 cmH20 Pressure  Support:  [5 cmH20] 5 cmH20 Plateau Pressure:  [19 cmH20-33 cmH20] 23 cmH20  Results for Clayton [name], Clayton [name] (MRN [id number]) as of 06/23/2012 06:16  Ref. Range 06/22/2012 04:58  Sample type No range found ARTERIAL DRAW  Delivery systems No range found VENTILATOR  FIO2 No range found 0.30  Mode No range found PRESSURE REGULATED VOLUME CONTROL  VT No range found 540  Peep/cpap No range found 5.0  pH, Arterial Latest Range: 7.350-7.450  7.420  pCO2 arterial Latest Range: 35.0-45.0 mmHg 44.4  pO2, Arterial Latest Range: 80.0-100.0 mmHg 87.1  Bicarbonate Latest Range: 20.0-24.0 mEq/L 28.1 (H)  TCO2 Latest Range: 0-100 mmol/L 29.4  Acid-Base Excess Latest Range: 0.0-2.0 mmol/L 4.0 (H)  O2 Saturation No range found 97.0  Patient temperature No range found 99.4  Collection site No range found RIGHT RADIAL  Allens test (pass/fail) Latest Range: PASS  PASS   Labs and Imaging:   Basic Metabolic Panel:  Lab [id number] 06/22/12 0510  NA 149* 145  K 3.4* 3.4*  CL 108 107  CO2 30 26  GLUCOSE 168* 207*  BUN 54* 48*  CREATININE 1.54* 1.38*  CALCIUM 9.5 8.7  MG 2.6* 2.7*  PHOS 3.1 2.0*   Liver Function Tests:  Lab 06/21/12 0530 06/20/12 0700  AST 30 --  ALT 19 --  ALKPHOS 60 --  BILITOT 0.2* --  PROT 6.7 --  ALBUMIN 2.6* 3.1*   CBC:  Lab 06/23/12 0455 06/22/12 0510  06/20/12 0700  WBC 16.8* 14.1* --  NEUTROABS 12.9* -- 12.4*  HGB 11.0* 10.6* --  HCT 33.6* 32.6* --  MCV 100.9* 99.1 --  PLT 259 257 --   Cardiac Enzymes:  Lab 06/19/12 0610 06/19/12 0030  CKTOTAL -- --  CKMB -- --  CKMBINDEX -- --  TROPONINI <0.30 <0.30   BNP:  Lab 06/23/12 0455 06/20/12 0700 06/19/12 0240  PROBNP 2155.0* 3548.0* 1178.0*   D-Dimer:  Lab 06/19/12 0610 06/19/12 0030  DDIMER 1.01* 0.66*   CBG:  Lab 06/23/12 0747 06/23/12 0350 06/22/12 2355 06/22/12 1942 06/22/12 1528 06/22/12 1128  GLUCAP 156* 162* 155* 254* 231* 218*   Hemoglobin A1C:  Lab 06/20/12 0700  HGBA1C 7.4*    Coagulation:  Lab 06/20/12 1620  LABPROT 14.4  INR 1.14   Anemia Panel:  Lab 06/20/12 0700  VITAMINB12 1074*  FOLATE 18.2  FERRITIN 324*  TIBC 256  IRON 11*  RETICCTPCT 1.5   Urinalysis:  Lab 06/20/12 0603  COLORURINE YELLOW  LABSPEC >1.030*  PHURINE 5.0  GLUCOSEU 250*  HGBUR MODERATE*  BILIRUBINUR NEGATIVE  KETONESUR NEGATIVE  PROTEINUR 100*  UROBILINOGEN 0.2  NITRITE NEGATIVE  LEUKOCYTESUR NEGATIVE   Misc. Labs: none   Assessment and Plan: PULMONARY 1/6 [id number] peribronchial thickening. Patchy bilateral opacities again noted, slightly improved. No visible effusion. No acute bony abnormality.  ASSESSMENT:  1) VDRF - 2/2 H1N1 + and influenza A + with ossible superimposed pneumonia (CAP), Intubated on 06/20/2012, no ARDS paO2/FiO2 ratio 290 (>200). Attempted to wean ETT 1/4-1/6 w/o success. 1/6 attempt patient became tachypneic 30s-40s.  CXR slightly improved  2) LLL 30mm pulmonary nodule - noted on CT angiogram, will require 1 year follow chest CT in setting of prior tobacco abuse.  3) +MRSA tracheal aspirate 1/3   PLAN:  -tried to wean today attempt failure, with rr elevation, rsbi elevated -pcxr reviewed, repeat in am  -neg balance obtained, unable next 24 hrs, dc lasix -ct neg pe 1/2 -Control afterload  CARDIOVASCULAR 1/3 echo EF 0000000 grade 1 diastolic dysfuntion   ASSESSMENT:  1) Hypertension - hypertension improved slightly. 2) Hyperlipidemia - on home simvastatin. Will be held while intubated.  3) Pro BNP trending down 3548-->2155  PLAN:  - Continue to monitor. Diltiazem 60 mg q6. Consider increasing dose as BP and HR can tolerate  -neg balance successful  RENAL ASSESSMENT:  1) acute on CKD (unknown baseline)-Cr. Trended back up 1.38-->1.54, some over diuresis 1/5 2) BPH - on home finasteride. Held while intubated.  3) Hypernatremia 4) hypoK  PLAN:  -Consider outpatient MRI renal for renal cysts   -FW flushes 200 q8 -euvolemic now?,  hold lasix Add d5w at 30   GASTROINTESTINAL Loose stool  Nutrition  GI px   ASSESSMENT:  Likely due to antibiotics  Nutrition  R/o cdiff  PLAN:  Pending C. Diff  Opexa, Prostat, may need vital pepcid GI px   HEMATOLOGIC ASSESSMENT:  1) Normocytic anemia 2) DVT px  3) leukocytosis-trending up - hemoconcetration  PLAN:  Trend cbc in am  Free water Sub q hep  INFECTIOUS ASSESSMENT:  1) H1N1 positive and Influenza A +  2) CXR concerning for multifocal right pneumonia, CAP.  3) +MRSA tracheal aspirate 1/3. Leukocytosis trending up on Vanc, failure vanc?  PLAN:  -Continue tamilfu 150 bid (total days Tamiflu Day 5)  -failed vanc, add linazolid -Bld cultures NTD and pending  ENDOCRINE ASSESSMENT:  1) DM2, unknown baseline A1c now 7.4% still hyperglycemia fsbs ranging 150s-250s  PLAN:  1)SSI, Lantus 40 units q24, Novolog 10 units q4, increase 45 Add TF coverage, we are adding d5 to fluids  NEUROLOGIC 1) Acute encephalopathy secondary to acute respiratory failure, resolved  2) pain  PLAN:  Monitor mental status PT consult , ambulation is goal Prn fent, may need drip  CLINICAL SUMMARY: Trying to wean vent. , +H1N1 and influenza A + possible superimposed CAP with mrsa, +tracheal aspirate with MRSA, BP slightly improved. Low threshold to transition to Linezolid. PT ordered to ambulate patient. Failed weaning, not ready yet, mild over diuretics, free water  Best practices / Disposition: -->ICU status under PCCM -->full code -->Heparin for DVT Px -->Pepcid for GI Px -->ventilator bundle -->diet, TV Oxepa -->family updated at bedside, daughter  Cresenciano Genre [id number] 06/23/2012, 12:41 PM  The patient is critically ill with multiple organ systems failure and requires high complexity decision making for assessment and support, frequent evaluation and titration of therapies, application of advanced monitoring technologies and extensive interpretation of multiple  databases. Critical Care Time devoted to patient care services described in this note is 30  Minutes.  Lavon Paganini. [name], MD, [place] Pgr: [place] [facility]

## 2012-06-23 NOTE — Evaluation (Addendum)
Physical Therapy Evaluation Patient Details Name: Clayton [name] MRN: [id number] DOB: February 17, 1928 Today's Date: 06/23/2012 Time: 1300-1400 PT Time Calculation (min): 60 min  PT Assessment / Plan / Recommendation Clinical Impression  Pt with acute respiratory failure secondary to H1N1.  PMH includes type II DM, CKD, and HTN.  Pt presents with incr wob, fatigue, and decr activity tolerance.  Will benefit from skilled physical therapy services to address these limitations. Will follow in acute setting, recommend CIR at this time.     PT Assessment  Patient needs continued PT services    Follow Up Recommendations  CIR          Equipment Recommendations  Rolling walker with 5" wheels    Recommendations for Other Services Rehab consult   Frequency Min 3X/week    Precautions / Restrictions Restrictions Weight Bearing Restrictions: No   Pertinent Vitals/Pain BP increased to 196/52 once in sitting, down to 176/53 at end of session. Some pain in chest, RN made aware.       Mobility  Bed Mobility Bed Mobility: Rolling Right;Rolling Left;Supine to Sit;Sitting - Scoot to [employer] of Bed;Sit to Supine;Scooting to [facility] Rolling Right: 6: Modified independent (Device/Increase time) Rolling Left: 6: Modified independent (Device/Increase time)  Cleaned patient of stool rolling several times and changing pad and gown.   Supine to Sit: 3: Mod assist;With rails;HOB elevated Sitting - Scoot to Edge of Bed: 4: Min guard;With rail Sit to Supine: 4: Min assist;With rail;HOB elevated Scooting to HOB: 1: +2 Total assist Scooting to [facility]: Patient Percentage: 70% Details for Bed Mobility Assistance: assist for lines and tubes Transfers Transfers: Not assessed Ambulation/Gait Ambulation/Gait Assistance: Not tested (comment) Stairs: No Wheelchair Mobility Wheelchair Mobility: No           PT Diagnosis: Generalized weakness  PT Problem List: Decreased strength;Decreased range of motion;Decreased  activity tolerance;Decreased balance;Decreased mobility;Decreased knowledge of use of DME PT Treatment Interventions: Gait training;Stair training;Functional mobility training;DME instruction;Therapeutic activities;Therapeutic exercise;Balance training   PT Goals Acute Rehab PT Goals PT Goal Formulation: With patient Time For Goal Achievement: 07/07/12 Potential to Achieve Goals: Good Pt will Roll Supine to Right Side: Independently PT Goal: Rolling Supine to Right Side - Progress: Goal set today Pt will Roll Supine to Left Side: Independently PT Goal: Rolling Supine to Left Side - Progress: Goal set today Pt will Sit at Edge of Bed: Independently PT Goal: Sit at Edge Of Bed - Progress: Goal set today Pt will go Sit to Supine/Side: Independently PT Goal: Sit to Supine/Side - Progress: Goal set today Pt will go Sit to Stand: Independently PT Goal: Sit to Stand - Progress: Goal set today Pt will go Stand to Sit: Independently PT Goal: Stand to Sit - Progress: Goal set today Pt will Transfer Bed to Chair/Chair to Bed: with modified independence PT Transfer Goal: Bed to Chair/Chair to Bed - Progress: Goal set today Pt will Stand: with modified independence;3 - 5 min;with unilateral upper extremity support PT Goal: Stand - Progress: Goal set today Pt will Ambulate: >150 feet;with modified independence;with least restrictive assistive device PT Goal: Ambulate - Progress: Goal set today  Visit Information  Last PT Received On: 06/23/12 Assistance Needed: +2    Subjective Data  Subjective: Pt communicated with hand signs and head nods, agreed to work with therapy  Patient Stated Goal: none stated    Prior Functioning  Home Living Lives With: Alone Available Help at Discharge: Family;Available 24 hours/day Type of Home: House Home Access: Ramped  entrance Home Layout: One level;Other (Comment) (one level with basement, does not use basement) Bathroom Shower/Tub: Other (comment)  (shower chair) Bathroom Toilet: Handicapped height Bathroom Accessibility: Yes How Accessible: Accessible via walker Home Adaptive Equipment: Straight cane;Grab bars around toilet;Grab bars in shower;Shower chair with back Additional Comments: pt's daughter provided home living information, states if her father d/c home that he will live with her, and other family will all make sure to help when needed Prior Function Level of Independence: Independent Able to Take Stairs?: Yes Driving: Yes Comments: PTA pt was very active, has cycled across country five times, worked out regularly at the [employer]: Other (comment) (Vent) Dominant Hand: Right    Cognition  Overall Cognitive Status: Appears within functional limits for tasks assessed/performed Arousal/Alertness: Lethargic Orientation Level: Other (comment) (unable to assess at this time) Behavior During Session: [facility] for tasks performed Cognition - Other Comments: appears to understand situation and able to follow multi step commands consistently, however lethargic due to medicine recently received    Extremity/Trunk Assessment Right Upper Extremity Assessment RUE ROM/Strength/Tone: [facility] for tasks assessed Left Upper Extremity Assessment LUE ROM/Strength/Tone: [facility] for tasks assessed Right Lower Extremity Assessment RLE ROM/Strength/Tone: [facility] for tasks assessed Left Lower Extremity Assessment LLE ROM/Strength/Tone: [facility] for tasks assessed Trunk Assessment Trunk Assessment: Normal   Balance Balance Balance Assessed: Yes Static Sitting Balance Static Sitting - Balance Support: Bilateral upper extremity supported;Feet supported Static Sitting - Level of Assistance: 6: Modified independent (Device/Increase time) Static Sitting - Comment/# of Minutes: good sitting balance, incr coughing and secretions in sitting, BP elevated upon sitting.  Pt wanted to sit up in chair, however due to lack of chair in room, elevated BP,  and incr fatigue, pt decided to lay back in bed today.    End of Session PT - End of Session Equipment Utilized During Treatment: Oxygen Activity Tolerance: Patient limited by fatigue Patient left: in bed;with call bell/phone within reach Nurse Communication: Mobility status       Zannie Kehr 06/23/2012, 2:29 PM Zannie Kehr, SPT Acute Rehab Services [phone]  [facility] Acute Rehabilitation [phone] (pager)

## 2012-06-24 LAB — BASIC METABOLIC PANEL
CO2: 28 mEq/L (ref 19–32)
Chloride: 109 mEq/L (ref 96–112)
Creatinine, Ser: 1.35 mg/dL (ref 0.50–1.35)
Glucose, Bld: 207 mg/dL — ABNORMAL HIGH (ref 70–99)
Sodium: 147 mEq/L — ABNORMAL HIGH (ref 135–145)

## 2012-06-24 LAB — GLUCOSE, CAPILLARY
Glucose-Capillary: 148 mg/dL — ABNORMAL HIGH (ref 70–99)
Glucose-Capillary: 172 mg/dL — ABNORMAL HIGH (ref 70–99)
Glucose-Capillary: 187 mg/dL — ABNORMAL HIGH (ref 70–99)

## 2012-06-24 LAB — CBC WITH DIFFERENTIAL/PLATELET
Eosinophils Relative: 1 % (ref 0–5)
HCT: 31.9 % — ABNORMAL LOW (ref 39.0–52.0)
Lymphs Abs: 1.2 10*3/uL (ref 0.7–4.0)
MCV: 102.6 fL — ABNORMAL HIGH (ref 78.0–100.0)
Monocytes Relative: 16 % — ABNORMAL HIGH (ref 3–12)
Neutro Abs: 12.2 10*3/uL — ABNORMAL HIGH (ref 1.7–7.7)
RBC: 3.11 MIL/uL — ABNORMAL LOW (ref 4.22–5.81)
WBC: 16.6 10*3/uL — ABNORMAL HIGH (ref 4.0–10.5)

## 2012-06-24 LAB — POCT I-STAT 3, ART BLOOD GAS (G3+)
Patient temperature: 98.7
TCO2: 32 mmol/L (ref 0–100)
pCO2 arterial: 53.5 mmHg — ABNORMAL HIGH (ref 35.0–45.0)
pH, Arterial: 7.357 (ref 7.350–7.450)

## 2012-06-24 LAB — PHOSPHORUS: Phosphorus: 3.3 mg/dL (ref 2.3–4.6)

## 2012-06-24 LAB — FOLATE: Folate: 10.6 ng/mL

## 2012-06-24 MED ORDER — LOPERAMIDE HCL 1 MG/5ML PO LIQD
2.0000 mg | ORAL | Status: DC | PRN
  Administered 2012-06-25: 2 mg
  Filled 2012-06-24 (×3): qty 10

## 2012-06-24 MED ORDER — INSULIN ASPART 100 UNIT/ML ~~LOC~~ SOLN: 10.0000 [IU] | Freq: Three times a day (TID) | SUBCUTANEOUS | Status: DC

## 2012-06-24 MED ORDER — LORAZEPAM 1 MG PO TABS
1.0000 mg | ORAL_TABLET | Freq: Three times a day (TID) | ORAL | Status: DC
  Administered 2012-06-24 – 2012-07-01 (×22): 1 mg
  Filled 2012-06-24 (×22): qty 1

## 2012-06-24 MED ORDER — INSULIN ASPART 100 UNIT/ML ~~LOC~~ SOLN
5.0000 [IU] | SUBCUTANEOUS | Status: DC
  Administered 2012-06-24 – 2012-06-25 (×6): 5 [IU] via SUBCUTANEOUS

## 2012-06-24 MED ORDER — LORAZEPAM 2 MG/ML PO CONC: 1.0000 mg | Freq: Three times a day (TID) | ORAL | Status: DC

## 2012-06-24 MED ORDER — LORAZEPAM 1 MG PO TABS: 1.0000 mg | ORAL_TABLET | Freq: Three times a day (TID) | ORAL | Status: DC

## 2012-06-24 MED ORDER — LOPERAMIDE HCL 1 MG/5ML PO LIQD
4.0000 mg | Freq: Once | ORAL | Status: AC
  Administered 2012-06-24: 4 mg
  Filled 2012-06-24: qty 20

## 2012-06-24 NOTE — Clinical Social Work Placement (Addendum)
    Clinical Social Work Department CLINICAL SOCIAL WORK PLACEMENT NOTE 06/24/2012  Patient:  COBI, TAFEL  Account Number:  [id number] Admit date:  06/18/2012  Clinical Social Worker:  Sindy Messing, LCSW  Date/time:  06/24/2012 11:00 AM  Clinical Social Work is seeking post-discharge placement for this patient at the following level of care:   [place]   (*CSW will update this form in Epic as items are completed)   06/24/2012  Patient/family provided with [facility] Department of Clinical Social Work's list of facilities offering this level of care within the geographic area requested by the patient (or if unable, by the patient's family).  06/24/2012  Patient/family informed of their freedom to choose among providers that offer the needed level of care, that participate in Medicare, Medicaid or managed care program needed by the patient, have an available bed and are willing to accept the patient.  06/24/2012  Patient/family informed of [facility]' ownership interest in [facility], as well as of the fact that they are under no obligation to receive care at this facility.  PASARR submitted to EDS on 06/24/2012 PASARR number received from EDS on 06/24/2012  FL2 transmitted to all facilities in geographic area requested by pt/family on  06/24/2012 FL2 transmitted to all facilities within larger geographic area on   Patient informed that his/her managed care company has contracts with or will negotiate with  certain facilities, including the following:     Patient/family informed of bed offers received:  06/27/12 Patient chooses bed at [facility] Physician recommends and patient chooses bed at    Patient to be transferred to [facility] on 07-02-12   Patient to be transferred to facility by Private vehicle  The following physician request were entered in Epic:   Additional Comments:

## 2012-06-24 NOTE — Progress Notes (Signed)
Pt unable to tolerate a Pressure Support of 5 and Peep of 5. RT turned Pressure Support to 20 after working with pt to calm him down and settle his breathing. Pt is able to tolerate Pressure Support of 20.

## 2012-06-24 NOTE — Clinical Social Work Psychosocial (Signed)
     Clinical Social Work Department BRIEF PSYCHOSOCIAL ASSESSMENT 06/24/2012  Patient:  Clayton [name], Clayton [name]     Account Number:  [id number]     Admit date:  06/18/2012  Clinical Social Worker:  Earlie Server  Date/Time:  06/24/2012 11:00 AM  Referred by:  Physician  Date Referred:  06/24/2012 Referred for  SNF Placement   Other Referral:   Interview type:  Patient Other interview type:    PSYCHOSOCIAL DATA Living Status:  ALONE Admitted from facility:   Level of care:   Primary support name:  Chuck Primary support relationship to patient:  CHILD, ADULT Degree of support available:   Strong    CURRENT CONCERNS Current Concerns  Post-Acute Placement   Other Concerns:    SOCIAL WORK ASSESSMENT / PLAN CSW received referral due to patient's family being concerned about dc plans. CSW reviewed chart and met with patient and son at bedside.    CSW introduced myself and explained role. Patient slept during most of assessment. PTA patient was living alone but very active. Patient was supposed to drive RV down to [place] for the next 3 months. Son reports that patient needs rehab at dc. Per chart review, patient's insurance will not cover CIR. CSW explained this to son and explained SNF option. CSW provided son with SNF list. CSW explained SNF process and insurance coverage. CSW explained the need to find a facility that was in network with patient's insurance. Son reports he wants search done as soon as possible in order to be able to tour facilities  before making decision. Son requests [facility], [place] and [employer]. Patient has 1 son and 3 dtrs and son wants a facility near family so they can visit patient.    CSW completed FL2 and submitted for pasarr. CSW faxed out and will follow up with bed offers.   Assessment/plan status:  Psychosocial Support/Ongoing Assessment of Needs Other assessment/ plan:   Information/referral to community resources:   SNF list     PATIENTS/FAMILYS RESPONSE TO PLAN OF CARE: Patient slept during assessment. Son engaged in assessment and asked appropriate questions. Son reports anxious about process and wants enough time to make informed decision rather than feeling rushed at dc. Son agreeable to CSW follow up.

## 2012-06-24 NOTE — Progress Notes (Signed)
Name: Clayton [name] MRN: [id number] DOB: [birth year] LOS: 6  PCCM RESIDENT DAILY PROGRESS NOTE  History of Present Illness: [name] is a 79 y.o. male with a PMHX of DM2, CKD, HTN who was admitted to [facility] on 06/19/2012 with acute respiratory failure secondary to H1N1 and requiring intubation on 06/22/2012  Lines / Drains: ETT 01/03 >>  PIV left x 3 1/2-1/3  Cultures: Influenza Panel A 06/19/2012  H1N1 POSITIVE 1/2  MRSA nares + 1/2  Urine Cx negative  bld cultures 1/3 NTD, pending  Resp Cx 1/3 + MRSA tracheal aspirate  1/6 C.diff negative   Antibiotics: Azithromycin 1/2 >>1/3  Levaquin 1/3 >>1/5  ceftriaxone 1/3>>>1/5 vanc iv 1/3>>>1/6 Vanc oral 1/6>>1/7 Tamiflu 1/2 >>  Linezolid 1/6>>  Tests / Events: 06/21/2011 - Patient developed acute respiratory failure requiring ETT  1/3- broderline BP  1/4-no overnight events except diarrhea  1/5- mrsa noted 1/6-trouble weaning, stood  Overnight Events:  fsbs 170s otherwise no events except 2 loose stools   Vital Signs: Temp:  [95.9 F (35.5 C)-99.3 F (37.4 C)] 95.9 F (35.5 C) (01/07 0700) Pulse Rate:  [54-105] 73  (01/07 0700) Resp:  [12-25] 19  (01/07 0700) BP: (108-196)/(48-74) 157/59 mmHg (01/07 0700) SpO2:  [94 %-100 %] 100 % (01/07 0700) FiO2 (%):  [30 %] 30 % (01/07 0700) Weight:  [243 lb 13.3 oz (110.6 kg)] 243 lb 13.3 oz (110.6 kg) (01/07 0500) I/O last 3 completed shifts: In: 3129.9 [I.V.:879.9; NG/GT:1100; IV Piggyback:1150] Out: 2270 [Urine:2270]  S: patient states he is having trouble breathing but no other complaints. With suction he has loose BM. No other complaints  Physical Examination: General:  Lying in bed, awake, nad Neuro:  Follows commands HEENT:  ETT intact with mod, tan thick secretions, Delta/at Cardiovascular:  RRR no murmurs Lungs:  Course breath sounds b/l Abdomen:  Obese, ntnd, +nl bs Musculoskeletal:  No cyanosis, edema lower ext, warm Skin:  intact  Ventilator  settings: Vent Mode:  [-] PRVC FiO2 (%):  [30 %] 30 % Set Rate:  [18 bmp] 18 bmp Vt Set:  [540 mL] 540 mL PEEP:  [5 cmH20] 5 cmH20 Plateau Pressure:  [23 [id number] cmH20] 24 cmH20  Results for MARKICE, HALLIWELL (MRN [id number]) as of 06/24/2012 06:37  Ref. Range 06/24/2012 04:08  Sample type No range found ARTERIAL  pH, Arterial Latest Range: 7.350-7.450  7.357  pCO2 arterial Latest Range: 35.0-45.0 mmHg 53.5 (H)  pO2, Arterial Latest Range: 80.0-100.0 mmHg 97.0  Bicarbonate Latest Range: 20.0-24.0 mEq/L 30.0 (H)  TCO2 Latest Range: 0-100 mmol/L 32  Acid-Base Excess Latest Range: 0.0-2.0 mmol/L 4.0 (H)  O2 Saturation No range found 97.0  Patient temperature No range found 98.7 F  Collection site No range found RADIAL, ALLEN'S TEST ACCEPTABLE    Labs and Imaging:   Basic Metabolic Panel:  Lab 99991111 0400 06/23/12 0455  NA 147* 149*  K 4.3 3.4*  CL 109 108  CO2 28 30  GLUCOSE 207* 168*  BUN 58* 54*  CREATININE 1.35 1.54*  CALCIUM 9.3 9.5  MG 2.7* 2.6*  PHOS 3.3 3.1   Liver Function Tests:  Lab 06/21/12 0530 06/20/12 0700  AST 30 --  ALT 19 --  ALKPHOS 60 --  BILITOT 0.2* --  PROT 6.7 --  ALBUMIN 2.6* 3.1*   CBC:  Lab 06/24/12 0400 06/23/12 0455  WBC 16.6* 16.8*  NEUTROABS 12.2* 12.9*  HGB 10.2* 11.0*  HCT 31.9* 33.6*  MCV 102.6* 100.9*  PLT 261 259   Cardiac Enzymes:  Lab 06/19/12 0610 06/19/12 0030  CKTOTAL -- --  CKMB -- --  CKMBINDEX -- --  TROPONINI <0.30 <0.30   BNP:  Lab 06/23/12 0455 06/20/12 0700 06/19/12 0240  PROBNP 2155.0* 3548.0* 1178.0*   D-Dimer:  Lab 06/19/12 0610 06/19/12 0030  DDIMER 1.01* 0.66*   CBG:  Lab 06/24/12 0357 06/24/12 0008 06/23/12 2011 06/23/12 1611 06/23/12 1219 06/23/12 0747  GLUCAP 172* 243* 170* 183* 213* 156*   Hemoglobin A1C:  Lab 06/20/12 0700  HGBA1C 7.4*   Coagulation:  Lab 06/20/12 1620  LABPROT 14.4  INR 1.14   Anemia Panel:  Lab 06/20/12 0700  VITAMINB12 1074*  FOLATE 18.2  FERRITIN 324*   TIBC 256  IRON 11*  RETICCTPCT 1.5   Urinalysis:  Lab 06/20/12 0603  COLORURINE YELLOW  LABSPEC >1.030*  PHURINE 5.0  GLUCOSEU 250*  HGBUR MODERATE*  BILIRUBINUR NEGATIVE  KETONESUR NEGATIVE  PROTEINUR 100*  UROBILINOGEN 0.2  NITRITE NEGATIVE  LEUKOCYTESUR NEGATIVE   Misc. Labs: none   Assessment and Plan: PULMONARY  ASSESSMENT:  1) VDRF - 2/2 H1N1 + and influenza A + with possible superimposed pneumonia (CAP)-multiple failed attempts to wean 2) LLL 39mm pulmonary nodule - noted on CT angiogram, will require 1 year follow chest CT in setting of prior tobacco abuse.  3) +MRSA tracheal aspirate 1/3  4) anxiety component  PLAN:  -attempt wean, ps 5, cpap 5, goal 2 hrs, failed PS increased 20, reduce if able -stand, walk on vent -ABG reviewed, maintain current MV -pcxr in am -aggressive abx  CARDIOVASCULAR 1/3 echo EF 0000000 grade 1 diastolic dysfuntion   ASSESSMENT:  1) Hypertension - improved 2) Hyperlipidemia - on home simvastatin held while intubated.   PLAN:  -Continue to monitor. Diltiazem 90 mg q6 -net net i/o obtained at -1.376 L, pos last 2 4 hrs  RENAL ASSESSMENT:  1) acute on CKD (unknown baseline)-Cr. Normalized 2) BPH - on home finasteride. Held while intubated.  3) Hypernatremia, improving  PLAN:  -Consider outpatient MRI renal for renal cysts   -FW flushes 200 q8, D5 W 30 cc/hr increased to 50 cc/hr, bmet in am , resoloving na with free water and crt  GASTROINTESTINAL Loose stool  Nutrition  GI px   ASSESSMENT:  Loose stool x 2 overnight. Likely due to antibiotics. C. Diff negative Nutrition   PLAN:  Imodium 4 mg x 1 then 2 mg prn not to exceed 16 mg/day Vital AF, Prostat pepcid GI px   HEMATOLOGIC ASSESSMENT:  1) macrocytic anemia 2) DVT px  3) leukocytosis stable  PLAN:  Sub q hep Trend cbc in am Macrocytosis, b12 level - 99991111, folic acid level wnl  INFECTIOUS ASSESSMENT:  1) H1N1 positive and Influenza A +  2)  +MRSA tracheal aspirate 1/3. Leukocytosis stable   PLAN:  -Continue tamilfu 150 bid (total days Tamiflu Day 6/10)  -Continue Linazolid, plat count trend -Bld cultures NTD and pending  ENDOCRINE ASSESSMENT:  1) DM2, unknown baseline A1c now 7.4% still hyperglycemia fsbs ranging 150s-240s  PLAN:  1)SSI, Lantus 45 units q24, Novolog 4 units q8 increased to 10 units q8 Goals remain 140-180, met ensure TF coverage at 5   NEUROLOGIC 1) Acute encephalopathy secondary to acute respiratory failure, resolved  2) pain 3) anxiety component  PLAN:  Monitor mental status Prn fent, versed. Precedex drip Add ativan oral  Stood on vent, goal to ambulate  CLINICAL SUMMARY: Trying to wean vent. , +H1N1  and influenza A + possible superimposed CAP with mrsa, +tracheal aspirate with MRSA, BP improved. Weaning attempts continue   Best practices / Disposition: -->ICU status under PCCM -->full code -->Heparin for DVT Px -->Pepcid for GI Px -->ventilator bundle -->diet, TV Vital AF, Prostat -->family updated at bedside, daughter  Cresenciano Genre [id number] 06/24/2012, 7:20 AM  I have fully examined this patient and agree with above findings.    And edited in full Ccm time 30 min   Lavon Paganini. [name], MD, Carmi Pgr: [facility]

## 2012-06-24 NOTE — Progress Notes (Addendum)
Physical Therapy Treatment Patient Details Name: Clayton [name] MRN: [id number] DOB: 07-Dec-1927 Today's Date: 06/24/2012 Time: 1440-1510 PT Time Calculation (min): 30 min  PT Assessment / Plan / Recommendation Comments on Treatment Session  Vitals remained very stable.  Pt participated well, but was lethargic and sleepy throughout and needed some encouragement to do several tasks incl standing.    Follow Up Recommendations  CIR     Does the patient have the potential to tolerate intense rehabilitation     Barriers to Discharge        Equipment Recommendations  Rolling walker with 5" wheels    Recommendations for Other Services Rehab consult  Frequency Min 3X/week   Plan Discharge plan remains appropriate;Frequency remains appropriate    Precautions / Restrictions Precautions Precautions: Fall Restrictions Weight Bearing Restrictions: No   Pertinent Vitals/Pain Vitals stable    Mobility  Bed Mobility Bed Mobility: Rolling Left;Supine to Sit;Sitting - Scoot to [employer] of Bed;Sit to Supine Rolling Left: 4: Min assist Supine to Sit: 3: Mod assist;With rails;HOB elevated Sitting - Scoot to Edge of Bed: 4: Min assist Sit to Supine: 3: Mod assist Details for Bed Mobility Assistance: pt more sleepy today and needed more assist Transfers Transfers: Sit to Stand;Stand to Sit Sit to Stand: 1: +2 Total assist Sit to Stand: Patient Percentage: 60% Stand to Sit: 3: Mod assist;With upper extremity assist;To bed Details for Transfer Assistance: vc's for hand placement; steadying assist, help to come forward over his BOS Ambulation/Gait Ambulation/Gait Assistance: Not tested (comment) Stairs: No Wheelchair Mobility Wheelchair Mobility: No    Exercises     PT Diagnosis:    PT Problem List:   PT Treatment Interventions:     PT Goals Acute Rehab PT Goals PT Goal Formulation: With patient Time For Goal Achievement: 07/07/12 Potential to Achieve Goals: Good PT Goal: Rolling  Supine to Right Side - Progress: Progressing toward goal PT Goal: Rolling Supine to Left Side - Progress: Progressing toward goal PT Goal: Sit at Edge Of Bed - Progress: Progressing toward goal PT Goal: Sit to Supine/Side - Progress: Progressing toward goal PT Goal: Sit to Stand - Progress: Progressing toward goal PT Goal: Stand to Sit - Progress: Progressing toward goal PT Transfer Goal: Bed to Chair/Chair to Bed - Progress: Progressing toward goal PT Goal: Stand - Progress: Progressing toward goal  Visit Information  Last PT Received On: 06/24/12 Assistance Needed: +2    Subjective Data  Subjective: Had endotracheal tube, but signed and wrote on a pad Patient Stated Goal: none stated    Cognition  Overall Cognitive Status: Appears within functional limits for tasks assessed/performed Arousal/Alertness: Lethargic Orientation Level: Appears intact for tasks assessed Behavior During Session: [facility] for tasks performed    Balance  Balance Balance Assessed: Yes Static Sitting Balance Static Sitting - Balance Support: Feet supported;Bilateral upper extremity supported Static Sitting - Level of Assistance: 5: Stand by assistance Static Sitting - Comment/# of Minutes: pt more lethargic today, but managed to sit safely at EOB without assist, but did hold to the rail Static Standing Balance Static Standing - Balance Support: Bilateral upper extremity supported;During functional activity Static Standing - Level of Assistance: Other (comment) (min guard)  End of Session PT - End of Session Equipment Utilized During Treatment: Oxygen Activity Tolerance: Patient limited by fatigue Patient left: in bed;with call bell/phone within reach Nurse Communication: Mobility status   GP     Josalyn Dettmann, Tessie Fass 06/24/2012, 4:31 PM  06/24/2012  Donnella Sham, PT  [phone] (pager)

## 2012-06-25 ENCOUNTER — Inpatient Hospital Stay (HOSPITAL_COMMUNITY): Payer: Medicare PPO

## 2012-06-25 LAB — CBC WITH DIFFERENTIAL/PLATELET
Basophils Absolute: 0.2 10*3/uL — ABNORMAL HIGH (ref 0.0–0.1)
Basophils Relative: 1 % (ref 0–1)
Eosinophils Absolute: 0.4 10*3/uL (ref 0.0–0.7)
HCT: 31.9 % — ABNORMAL LOW (ref 39.0–52.0)
Hemoglobin: 10.3 g/dL — ABNORMAL LOW (ref 13.0–17.0)
Lymphocytes Relative: 10 % — ABNORMAL LOW (ref 12–46)
Lymphs Abs: 1.9 10*3/uL (ref 0.7–4.0)
MCH: 32.8 pg (ref 26.0–34.0)
MCHC: 32.3 g/dL (ref 30.0–36.0)
MCV: 101.6 fL — ABNORMAL HIGH (ref 78.0–100.0)
Monocytes Absolute: 2.4 10*3/uL — ABNORMAL HIGH (ref 0.1–1.0)
Neutro Abs: 13.8 10*3/uL — ABNORMAL HIGH (ref 1.7–7.7)
RDW: 13.4 % (ref 11.5–15.5)

## 2012-06-25 LAB — GLUCOSE, CAPILLARY
Glucose-Capillary: 142 mg/dL — ABNORMAL HIGH (ref 70–99)
Glucose-Capillary: 201 mg/dL — ABNORMAL HIGH (ref 70–99)
Glucose-Capillary: 176 mg/dL — ABNORMAL HIGH (ref 70–99)

## 2012-06-25 LAB — MAGNESIUM: Magnesium: 2.3 mg/dL (ref 1.5–2.5)

## 2012-06-25 LAB — BODY FLUID CELL COUNT WITH DIFFERENTIAL: Neutrophil Count, Fluid: 92 % — ABNORMAL HIGH (ref 0–25)

## 2012-06-25 LAB — BASIC METABOLIC PANEL
BUN: 54 mg/dL — ABNORMAL HIGH (ref 6–23)
CO2: 29 mEq/L (ref 19–32)
Chloride: 105 mEq/L (ref 96–112)
Creatinine, Ser: 1.37 mg/dL — ABNORMAL HIGH (ref 0.50–1.35)
GFR calc Af Amer: 54 mL/min — ABNORMAL LOW (ref >90)
Glucose, Bld: 159 mg/dL — ABNORMAL HIGH (ref 70–99)
Potassium: 3.6 mEq/L (ref 3.5–5.1)

## 2012-06-25 LAB — POCT I-STAT 3, ART BLOOD GAS (G3+)
Acid-Base Excess: 4 mmol/L — ABNORMAL HIGH (ref 0.0–2.0)
Bicarbonate: 30.8 mEq/L — ABNORMAL HIGH (ref 20.0–24.0)
O2 Saturation: 91 %
Patient temperature: 97.6

## 2012-06-25 LAB — LEGIONELLA ANTIGEN, URINE: Legionella Antigen, Urine: NEGATIVE

## 2012-06-25 MED ORDER — DEXAMETHASONE SODIUM PHOSPHATE 4 MG/ML IJ SOLN
6.0000 mg | Freq: Four times a day (QID) | INTRAMUSCULAR | Status: DC
  Filled 2012-06-25 (×3): qty 1.5

## 2012-06-25 MED ORDER — ETOMIDATE 2 MG/ML IV SOLN
INTRAVENOUS | Status: AC
  Filled 2012-06-25: qty 10

## 2012-06-25 MED ORDER — FENTANYL CITRATE 0.05 MG/ML IJ SOLN
INTRAMUSCULAR | Status: AC
  Filled 2012-06-25: qty 4

## 2012-06-25 MED ORDER — DEXAMETHASONE SODIUM PHOSPHATE 4 MG/ML IJ SOLN
6.0000 mg | Freq: Four times a day (QID) | INTRAMUSCULAR | Status: DC
  Administered 2012-06-25 – 2012-06-27 (×7): 6 mg via INTRAVENOUS
  Filled 2012-06-25 (×12): qty 1.5

## 2012-06-25 MED ORDER — INSULIN ASPART 100 UNIT/ML ~~LOC~~ SOLN
3.0000 [IU] | SUBCUTANEOUS | Status: DC
  Administered 2012-06-25 – 2012-06-26 (×6): 3 [IU] via SUBCUTANEOUS

## 2012-06-25 MED ORDER — DEXMEDETOMIDINE HCL IN NACL 400 MCG/100ML IV SOLN
0.2000 ug/kg/h | INTRAVENOUS | Status: AC
  Administered 2012-06-25 – 2012-06-26 (×4): 0.7 ug/kg/h via INTRAVENOUS
  Filled 2012-06-25 (×4): qty 100

## 2012-06-25 MED ORDER — INSULIN ASPART 100 UNIT/ML ~~LOC~~ SOLN: 3.0000 [IU] | SUBCUTANEOUS | Status: DC

## 2012-06-25 MED ORDER — ETOMIDATE 2 MG/ML IV SOLN
40.0000 mg | Freq: Once | INTRAVENOUS | Status: AC
  Administered 2012-06-25: 40 mg via INTRAVENOUS

## 2012-06-25 MED ORDER — SODIUM CHLORIDE 0.9 % IV SOLN
200.0000 ug/h | INTRAVENOUS | Status: DC
  Filled 2012-06-25: qty 50

## 2012-06-25 MED ORDER — FENTANYL CITRATE 0.05 MG/ML IJ SOLN
200.0000 ug | Freq: Once | INTRAMUSCULAR | Status: AC
  Administered 2012-06-25: 200 ug via INTRAVENOUS

## 2012-06-25 NOTE — Progress Notes (Signed)
Name: Clayton [name] MRN: [id number] DOB: Oct 17, 1927 LOS: 7  PCCM RESIDENT DAILY PROGRESS NOTE  History of Present Illness: [name] is a 79 y.o. male with a PMHX of DM2, CKD, HTN who was admitted to [facility] on 06/19/2012 with acute respiratory failure secondary to H1N1 and requiring intubation on 06/22/2012  Lines / Drains: ETT 01/03 >>  PIV left anticubital 1/2>>1/8 PIV left forearm 1/3>> due to be changed  PIV left lateral wrist1/3>>due to be changed   Cultures: Influenza Panel A 06/19/2012  H1N1 POSITIVE 1/2  MRSA nares + 1/2  Urine Cx negative  bld cultures 1/3 NTD, pending  Resp Cx 1/3 + MRSA tracheal aspirate  1/6 C.diff negative   Antibiotics: Azithromycin 1/2 >>1/3  Levaquin 1/3 >>1/5  ceftriaxone 1/3>>>1/5 vanc iv 1/3>>>1/6 Vanc oral 1/6>>1/7 Tamiflu 1/2 >>  Linezolid 1/6>>  Tests / Events: 06/21/2011 - Patient developed acute respiratory failure requiring ETT  1/3- borderline BP  1/4-no overnight events except diarrhea  1/5- mrsa noted 1/6-trouble weaning, stood 1/7-trouble weaning, anxiety, seemed fatigued with PT  Overnight Events:  none  Vital Signs: Temp:  [96.2 F (35.7 C)-99.3 F (37.4 C)] 97.6 F (36.4 C) (01/08 0700) Pulse Rate:  [55-94] 94  (01/08 0700) Resp:  [11-23] 22  (01/08 0700) BP: (124-161)/(44-67) 158/48 mmHg (01/08 0700) SpO2:  [94 %-100 %] 95 % (01/08 0700) FiO2 (%):  [30 %-100 %] 30 % (01/08 0700) Weight:  [244 lb 11.4 oz (111 kg)] 244 lb 11.4 oz (111 kg) (01/08 0500) I/O last 3 completed shifts: In: 3912.6 [I.V.:1812.6; NG/GT:1150; IV Piggyback:950] Out: 2895 [Urine:2895]  S: patient states he is having trouble breathing again today and having pain in his lungs  Physical Examination: General:  Lying in bed, awake, nad Neuro:  Follows commands HEENT:  ETT intact with mod, pink tinged, tan thick secretions, [place]/at Cardiovascular:  RRR no murmurs Lungs:  Course breath sounds b/l improved since yesterday Abdomen:   Obese, ntnd, +nl bs Musculoskeletal:  No cyanosis, edema lower ext, warm Skin:  Intact, bleeding site left anticubital with removal of PIV  Ventilator settings: Vent Mode:  [-] PRVC FiO2 (%):  [30 %-100 %] 30 % Set Rate:  [18 bmp] 18 bmp Vt Set:  [540 mL] 540 mL PEEP:  [5 cmH20] 5 cmH20 Pressure Support:  [10 cmH20] 10 cmH20 Plateau Pressure:  [12 cmH20-25 cmH20] 12 cmH20  ABG 1/8  7.337 pH 57.4 CO2 66 O2 Bicarb 30.8 (29 on BMET 1/8) O2 % 91%   Labs and Imaging:   Basic Metabolic Panel:  Lab [id number] 0420 06/24/12 0400  NA 144 147*  K 3.6 4.3  CL 105 109  CO2 29 28  GLUCOSE 159* 207*  BUN 54* 58*  CREATININE 1.37* 1.35  CALCIUM 9.7 9.3  MG 2.3 2.7*  PHOS 4.4 3.3   Liver Function Tests:  Lab 06/21/12 0530 06/20/12 0700  AST 30 --  ALT 19 --  ALKPHOS 60 --  BILITOT 0.2* --  PROT 6.7 --  ALBUMIN 2.6* 3.1*   CBC:  Lab 06/25/12 0420 06/24/12 0400  WBC 18.7* 16.6*  NEUTROABS 13.8* 12.2*  HGB 10.3* 10.2*  HCT 31.9* 31.9*  MCV 101.6* 102.6*  PLT 260 261   Cardiac Enzymes:  Lab 06/19/12 0610 06/19/12 0030  CKTOTAL -- --  CKMB -- --  CKMBINDEX -- --  TROPONINI <0.30 <0.30   BNP:  Lab 06/23/12 0455 06/20/12 0700 06/19/12 0240  PROBNP 2155.0* 3548.0* 1178.0*   D-Dimer:  Lab 06/19/12 0610 06/19/12 0030  DDIMER 1.01* 0.66*   CBG:  Lab 06/25/12 0349 06/24/12 2342 06/24/12 2037 06/24/12 1605 06/24/12 1240 06/24/12 0811  GLUCAP 148* 187* 148* 188* 229* 155*   Hemoglobin A1C:  Lab 06/20/12 0700  HGBA1C 7.4*   Coagulation:  Lab 06/20/12 1620  LABPROT 14.4  INR 1.14   Anemia Panel:  Lab 06/24/12 1621 06/20/12 0700  VITAMINB12 -- 1074*  FOLATE 10.6 --  FERRITIN -- 324*  TIBC -- 256  IRON -- 11*  RETICCTPCT -- 1.5   Urinalysis:  Lab 06/20/12 0603  COLORURINE YELLOW  LABSPEC >1.030*  PHURINE 5.0  GLUCOSEU 250*  HGBUR MODERATE*  BILIRUBINUR NEGATIVE  KETONESUR NEGATIVE  PROTEINUR 100*  UROBILINOGEN 0.2  NITRITE NEGATIVE    LEUKOCYTESUR NEGATIVE   Misc. Labs: none   Assessment and Plan: PULMONARY 06/25/12 CXR Bibasilar dependent air space disease, possibly due to edema or pneumonia  ASSESSMENT:  1) VDRF - 2/2 H1N1 + and influenza A + with possible superimposed pneumonia (CAP)-multiple failed attempts to wean 2) LLL 38mm pulmonary nodule - noted on CT angiogram, will require 1 year follow chest CT in setting of prior tobacco abuse.  3) +MRSA tracheal aspirate 1/3  4) anxiety  PLAN:  -attempt wean today if able. Will bronch today if has trouble weaning vent to exclude collapse lobes, staph high risk  And continued weaning failure Wean to goal PS 8  -ABG reviewed, no changes in MV or O2 needs required, CXR reviewed -cont. Linezolid  -Ambulate, mobilize Control anxiety  CARDIOVASCULAR  ASSESSMENT:  1) Hypertension -stable 2) Hyperlipidemia - on home simvastatin held while intubated.  -net + 24 hrs +590.8, net neg since admission -660.4   PLAN:  -Continue to monitor. Diltiazem 90 mg q6 -strict i/o, daily wts -balance to even to slitgh tneg  RENAL ASSESSMENT:  1) acute on CKD (unknown baseline)-Cr. stable 2) BPH - on home finasteride. Held while intubated.  3) Hypernatremia, resolved   PLAN:  -Consider outpatient MRI renal for renal cysts   -FW flushes 200 q8 dc, D5 W KVO'ed -even balance goals   GASTROINTESTINAL Loose stool  Nutrition  GI px   ASSESSMENT:  Loose stool persist Nutrition   PLAN:  Imodium 2 mg prn not to exceed 16 mg/day Vital AF, Prostat pepcid GI px   HEMATOLOGIC ASSESSMENT:  1) macrocytic anemia-stable 2) leukocytosis slightly trended up 3) DVT px   PLAN:  Trend cbc, wbc again in am, no fevers Sub q hep  INFECTIOUS ASSESSMENT:  1) H1N1 positive and Influenza A +  2) +MRSA tracheal aspirate 1/3. Leukocytosis trending up slightly  PLAN:  -Continue tamilfu 150 bid (total days Tamiflu Day 7/10)  -Continue Linazolid, plts wnl -Bld cultures NTD and  pending -bronch today, collapse contribution to failure?  ENDOCRINE ASSESSMENT:  1) DM2, unknown baseline A1c now 7.4% still hyperglycemia fsbs ranging 140s-180s More controlled PLAN:  1)SSI, Lantus 45 qhs, Novolog 5 units decreased to 3 units q4  NEUROLOGIC ASSESSMENT:  1) anxiety 2) pain  PLAN:  Monitor mental status Ativan 1 mg tid for anxiety Prn fent, versed. Precedex drip at 0.7, did well with this, likely needs low dose   CLINICAL SUMMARY: Trying to wean vent. If not tolerated will bronch today, +H1N1 and influenza A + possible superimposed CAP with mrsa, +tracheal aspirate with MRSA; Continue linezolid and Tamiflu, loose stools persists give prn Imodium, decreased q4 Novolog requirements to 3 units, give Ativan for anxiety. PT please ambulate for  physical deconditioning. Bronch, failed weaning etiology?  Best practices / Disposition: -->ICU status under PCCM -->full code -->Heparin for DVT Px -->Pepcid for GI Px -->ventilator bundle -->diet, TF Vital AF, Prostat -->family updated at bedside, daughter  Cresenciano Genre [id number] 06/25/2012, 7:38 AM  Ccm time 30 min  I have fully examined this patient and agree with above findings.    And edited in full  Lavon Paganini. [name], MD, [place] Pgr: [place] [facility]

## 2012-06-25 NOTE — Procedures (Signed)
Bronchoscopy Procedure Note Clayton [name] [id number] 15-Dec-1927  Procedure: Bronchoscopy Indications: Diagnostic evaluation of the airways, Obtain specimens for culture and/or other diagnostic studies and Remove secretions  Procedure Details Consent: Risks of procedure as well as the alternatives and risks of each were explained to the (patient/caregiver).  Consent for procedure obtained. Time Out: Verified patient identification, verified procedure, site/side was marked, verified correct patient position, special equipment/implants available, medications/allergies/relevent history reviewed, required imaging and test results available.  Performed  In preparation for procedure, patient was given 100% FiO2 and bronchoscope lubricated. Sedation: Etomidate  Airway entered and the following bronchi were examined: RUL, RML, RLL, LUL, LLL and Bronchi.   Procedures performed: Brushings performed Bronchoscope removed.  , Patient placed back on 100% FiO2 at conclusion of procedure.    Evaluation Hemodynamic Status: BP stable throughout; O2 sats: stable throughout Patient's Current Condition: stable Specimens:  Sent purulent fluid Complications: No apparent complications Patient did tolerate procedure well.   Clayton [name]. 06/25/2012  1. Tracheitis, bronchitis, severe, pus, ulcerations, breaking down mucosa, edema severe, diffuse 2. multiple white ulceration, breaking off with suctioning, sent path 3. BAL cytology RML, for infections and cytology 4. Rml, LLL edema almost closed, suctioned , improved  Add steroids, send HSV, cytology etc  Clayton [name]. [name], MD, [place] Pgr: [place] [facility]

## 2012-06-26 ENCOUNTER — Inpatient Hospital Stay (HOSPITAL_COMMUNITY): Payer: Medicare PPO

## 2012-06-26 LAB — CULTURE, BLOOD (ROUTINE X 2)

## 2012-06-26 LAB — CBC WITH DIFFERENTIAL/PLATELET
Basophils Absolute: 0 10*3/uL (ref 0.0–0.1)
Eosinophils Absolute: 0 10*3/uL (ref 0.0–0.7)
Lymphocytes Relative: 5 % — ABNORMAL LOW (ref 12–46)
Lymphs Abs: 0.9 10*3/uL (ref 0.7–4.0)
MCH: 32.4 pg (ref 26.0–34.0)
MCHC: 32.7 g/dL (ref 30.0–36.0)
Monocytes Absolute: 0.9 10*3/uL (ref 0.1–1.0)
Myelocytes: 5 %
Neutro Abs: 15.9 10*3/uL — ABNORMAL HIGH (ref 1.7–7.7)
Neutrophils Relative %: 79 % — ABNORMAL HIGH (ref 43–77)
Platelets: 275 10*3/uL (ref 150–400)
RDW: 13.1 % (ref 11.5–15.5)

## 2012-06-26 LAB — BASIC METABOLIC PANEL
CO2: 27 mEq/L (ref 19–32)
Calcium: 9.5 mg/dL (ref 8.4–10.5)
Chloride: 102 mEq/L (ref 96–112)
Creatinine, Ser: 1.26 mg/dL (ref 0.50–1.35)
GFR calc Af Amer: 60 mL/min — ABNORMAL LOW (ref >90)
Sodium: 138 mEq/L (ref 135–145)

## 2012-06-26 LAB — GLUCOSE, CAPILLARY
Glucose-Capillary: 285 mg/dL — ABNORMAL HIGH (ref 70–99)
Glucose-Capillary: 278 mg/dL — ABNORMAL HIGH (ref 70–99)
Glucose-Capillary: 308 mg/dL — ABNORMAL HIGH (ref 70–99)
Glucose-Capillary: 274 mg/dL — ABNORMAL HIGH (ref 70–99)

## 2012-06-26 LAB — MAGNESIUM: Magnesium: 2.2 mg/dL (ref 1.5–2.5)

## 2012-06-26 LAB — PHOSPHORUS: Phosphorus: 4.3 mg/dL (ref 2.3–4.6)

## 2012-06-26 MED ORDER — INSULIN ASPART 100 UNIT/ML ~~LOC~~ SOLN
5.0000 [IU] | SUBCUTANEOUS | Status: DC
  Administered 2012-06-26 – 2012-06-27 (×8): 5 [IU] via SUBCUTANEOUS

## 2012-06-26 MED ORDER — FUROSEMIDE 10 MG/ML IJ SOLN
40.0000 mg | Freq: Once | INTRAMUSCULAR | Status: AC
  Administered 2012-06-26: 40 mg via INTRAVENOUS
  Filled 2012-06-26: qty 4

## 2012-06-26 MED ORDER — SODIUM CHLORIDE 0.9 % IV BOLUS (SEPSIS): 1000.0000 mL | INTRAVENOUS | Status: DC | PRN

## 2012-06-26 MED ORDER — ACETYLCYSTEINE 10 % IN SOLN: 2.0000 mL | Freq: Two times a day (BID) | RESPIRATORY_TRACT | Status: DC

## 2012-06-26 MED ORDER — IPRATROPIUM-ALBUTEROL 18-103 MCG/ACT IN AERO
2.0000 | INHALATION_SPRAY | Freq: Four times a day (QID) | RESPIRATORY_TRACT | Status: DC
  Filled 2012-06-26: qty 14.7

## 2012-06-26 MED ORDER — INSULIN GLARGINE 100 UNIT/ML ~~LOC~~ SOLN
50.0000 [IU] | Freq: Every day | SUBCUTANEOUS | Status: DC
  Administered 2012-06-26: 50 [IU] via SUBCUTANEOUS

## 2012-06-26 MED ORDER — ACETYLCYSTEINE 20 % IN SOLN
3.0000 mL | Freq: Two times a day (BID) | RESPIRATORY_TRACT | Status: AC
  Administered 2012-06-26 – 2012-06-28 (×5): 3 mL via RESPIRATORY_TRACT
  Filled 2012-06-26 (×6): qty 4

## 2012-06-26 MED ORDER — ALBUTEROL SULFATE HFA 108 (90 BASE) MCG/ACT IN AERS
2.0000 | INHALATION_SPRAY | RESPIRATORY_TRACT | Status: DC | PRN
  Administered 2012-06-29 – 2012-07-01 (×3): 2 via RESPIRATORY_TRACT
  Filled 2012-06-26 (×4): qty 6.7

## 2012-06-26 MED ORDER — ALBUTEROL SULFATE (5 MG/ML) 0.5% IN NEBU
2.5000 mg | INHALATION_SOLUTION | Freq: Four times a day (QID) | RESPIRATORY_TRACT | Status: AC
  Administered 2012-06-26 – 2012-06-27 (×4): 2.5 mg via RESPIRATORY_TRACT
  Filled 2012-06-26 (×5): qty 0.5

## 2012-06-26 NOTE — Progress Notes (Signed)
Name: Clayton [name] MRN: [id number] DOB: 09-Apr-1928 LOS: 8  PCCM RESIDENT DAILY PROGRESS NOTE  History of Present Illness: Clayton [name] is a 79 y.o. male with a PMHX of DM2, CKD, HTN who was admitted to [facility] on 06/19/2012 with acute respiratory failure secondary to H1N1 and requiring intubation on 06/22/2012   Lines / Drains: ETT 01/03 >>  06/27/11 PIV left anticubital 1/2>>1/8  PIV left forearm 1/3>>1/8 PIV left lateral wrist1/3>>1/8 PIV right hand 1/8>> PIV left hand 1/9>> Rectal pouch 1/8 Condom cath 1/8   Cultures: Influenza Panel A 06/19/2012  H1N1 POSITIVE 1/2  MRSA nares + 1/2  Urine Cx negative  bld cultures 1/3 NTD, pending  Resp Cx 1/3 + MRSA tracheal aspirate  1/6 C.diff negative  1/8 HSV bronch> 1/8 BAL quant >>abundant wbc, +PMN, no sq cells, rare gm + cocci in pairs 1/8 Resp Cx bronch>>abundant wbc, +PMN, no sq cells, rare gm + cocci in pairs  1/8 bronch body fluid cell ct with diff: Red, turbid, 16750 wbc, 92 neutrophils, 8 monocyte-macrophages 1/8 cytology>> 1/8 surgical pathology>>  Antibiotics: Azithromycin 1/2 >>1/3  Levaquin 1/3 >>1/5  ceftriaxone 1/3>>>1/5  vanc iv 1/3>>>1/6  Vanc oral 1/6>>1/7  Tamiflu 1/2 >>  Linezolid 1/6>>  Tests / Events: 06/21/2011 - Patient developed acute respiratory failure requiring ETT  1/3- borderline BP  1/4-no overnight events except diarrhea  1/5- mrsa noted  1/6-trouble weaning, stood  1/7-trouble weaning, anxiety, seemed fatigued with PT 1/8-bronch with sig thick secretions, inflammation,erythema, tissue   SUBJECTIVE/OVERNIGHT/INTERVAL HX   Still with diarrhea has a rectal pouch Normal WUA on prn sedation. Doing well on SBT,. Meets extubation criteria  Vital Signs: Temp:  [97.5 F (36.4 C)-100.4 F (38 C)] 97.7 F (36.5 C) (01/09 0822) Pulse Rate:  [56-106] 59  (01/09 0700) Resp:  [15-27] 16  (01/09 0700) BP: (117-183)/(39-62) 129/45 mmHg (01/09 0700) SpO2:  [91 %-100 %] 94 % (01/09 0840) FiO2  (%):  [30 %] 30 % (01/09 0840) Weight:  [248 lb 3.8 oz (112.6 kg)] 248 lb 3.8 oz (112.6 kg) (01/09 0500) I/O last 3 completed shifts: In: 3935.9 [I.V.:1340.9; NG/GT:1645; IV Piggyback:950] Out: [id number] [Urine:3160]    Physical Examination: General:  Lying in bed, arousable, nad Neuro:  Follows commands HEENT:  [place]/at Cardiovascular:  RRR, no r/m/g Lungs:  Still with course breath sounds b/l but improving Abdomen:  Obese, ntnd, normal bs Musculoskeletal:  Warm extremities, no cyanosis or edema  Skin:  Intact   Ventilator settings: Vent Mode:  [-] PRVC FiO2 (%):  [30 %] 30 % Set Rate:  [18 bmp] 18 bmp Vt Set:  [540 mL] 540 mL PEEP:  [5 cmH20] 5 cmH20 Pressure Support:  [10 cmH20] 10 cmH20 Plateau Pressure:  [14 cmH20-18 cmH20] 18 cmH20  ABG: none 1/9  Labs and Imaging:   Basic Metabolic Panel:  Lab 0000000 0310 06/25/12 0420  NA 138 144  K 4.1 3.6  CL 102 105  CO2 27 29  GLUCOSE 310* 159*  BUN 52* 54*  CREATININE 1.26 1.37*  CALCIUM 9.5 9.7  MG 2.2 2.3  PHOS 4.3 4.4   Liver Function Tests:  Lab 06/21/12 0530 06/20/12 0700  AST 30 --  ALT 19 --  ALKPHOS 60 --  BILITOT 0.2* --  PROT 6.7 --  ALBUMIN 2.6* 3.1*   CBC:  Lab 06/26/12 0310 06/25/12 0420  WBC 17.7* 18.7*  NEUTROABS 15.9* 13.8*  HGB 10.3* 10.3*  HCT 31.5* 31.9*  MCV 99.1 101.6*  PLT 275 260   BNP:  Lab 06/23/12 0455 06/20/12 0700  PROBNP 2155.0* 3548.0*   CBG:  Lab 06/26/12 0752 06/26/12 0407 06/26/12 0003 06/25/12 2013 06/25/12 1543 06/25/12 1125  GLUCAP 278* 285* 267* 176* 201* 237*   Hemoglobin A1C:  Lab 06/20/12 0700  HGBA1C 7.4*   Coagulation:  Lab 06/20/12 1620  LABPROT 14.4  INR 1.14   Anemia Panel:  Lab 06/24/12 1621 06/20/12 0700  VITAMINB12 -- 1074*  FOLATE 10.6 --  FERRITIN -- 324*  TIBC -- 256  IRON -- 11*  RETICCTPCT -- 1.5   Urinalysis:  Lab 06/20/12 0603  COLORURINE YELLOW  LABSPEC >1.030*  PHURINE 5.0  GLUCOSEU 250*  HGBUR MODERATE*  BILIRUBINUR  NEGATIVE  KETONESUR NEGATIVE  PROTEINUR 100*  UROBILINOGEN 0.2  NITRITE NEGATIVE  LEUKOCYTESUR NEGATIVE   Misc. Labs: none  Assessment and Plan: PULMONARY  06/26/12 CXR Stable bilateral airspace infiltrates and left lower lobe consolidation.     ASSESSMENT:  1) VDRF - 2/2 H1N1 + and influenza A + with possible superimposed pneumonia (CAP)with left lower lobe consolidation-multiple failed attempts to wean but weaned for 6 hours 06/25/12 2) LLL 7mm pulmonary nodule - noted on CT angiogram, will require 1 year follow chest CT in setting of prior tobacco abuse.  3) +MRSA tracheal aspirate 1/3  4) anxiety   PLAN:  -continuing to attempt to wean. Try 5/5 today Pending bronch studies to follow  -CXR reviewed  -cont. Linezolid   CARDIOVASCULAR  ASSESSMENT:  1) Hypertension -intermittently hypertensive  2) Hyperlipidemia - on home simvastatin held while intubated.  -net + 24 hrs +625.2, net pos since admission +98.5 PLAN:  -Continue to monitor. Diltiazem 90 mg q6  -pending BNP to follow am, Lasix 40 mg iv x 1 -strict i/o, daily wts   RENAL  ASSESSMENT:  1) acute on CKD (unknown baseline)-resolved 2) BPH - on home finasteride. Held while intubated.   PLAN:  -Consider outpatient MRI renal for renal cysts  -d/c D5 KVO  GASTROINTESTINAL  ASSESSMENT:  Loose stool persists but has decreased now with rectal pouch Nutrition  GI px   PLAN:  Imodium 2 mg prn not to exceed 16 mg/day  Vital AF, Prostat  pepcid GI px   HEMATOLOGIC  ASSESSMENT:  1) normocytic anemia (borderline macrocytic)-stable  2) leukocytosis slightly trending down 3) DVT px   PLAN:  Trend cbc  Sub q hep   INFECTIOUS  ASSESSMENT:  1) H1N1 positive and Influenza A +  2) +MRSA tracheal aspirate 1/3. Leukocytosis trending down  PLAN:  -Continue tamilfu 150 bid (total days Tamiflu Day 8/10)  -Continue Linazolid, plts wnl  -Bld cultures NTD and pending, pending HSV, BAL quant, Resp Cx, cytology, surgical  pathology  ENDOCRINE  ASSESSMENT:  1) DM2, unknown baseline A1c now 7.4% still hyperglycemia fsbs ranging 200s-300s. Likely elevated due to Decadron PLAN:  1)SSI, Lantus 50 qhs, Novolog 5 units q4  NEUROLOGIC  ASSESSMENT:  1) anxiety-improving 2) pain   PLAN:  Monitor mental status  Ativan 1 mg tid for anxiety  Prn fent, versed. Precedex drip at 0.7   CLINICAL SUMMARY: Trying to wean vent. Daily. Tolerated weaning 6 hours 1/8.   +H1N1 and influenza A + possible superimposed CAP with mrsa, +tracheal aspirate with MRSA; Continue linezolid and Tamiflu, loose stools persists but improved, give prn Imodium, hyperglycemic adjusted q4 hour Novolog. PT following and appreciate their help ambulating patient.     Best practices / Disposition: -->ICU status under PCCM -->full  code -->Heparin for DVT Px -->Pepcid for GI Px -->ventilator bundle -->diet-TF -->family updated at bedside  Cresenciano Genre [id number] 06/26/2012, 8:41 AM   STAFF NOTE: I, Dr Ann Lions have personally reviewed patient's available data, including medical history, events of note, physical examination and test results as part of my evaluation. I have discussed with resident/NP and other care providers such as pharmacist, RN and RRT.  In addition,  I personally evaluated patient and elicited key findings of  Acute resp failure due to influenza and staph MRSA pneumonia. Doing well on SBT. Normal wake up. Will give lasix and extubate.  Rest per NP/medical resident whose note is outlined above and that I agree with  The patient is critically ill with multiple organ systems failure and requires high complexity decision making for assessment and support, frequent evaluation and titration of therapies, application of advanced monitoring technologies and extensive interpretation of multiple databases.   Critical Care Time devoted to patient care services described in this note is  35  Minutes independent of teaching time  Dr.  Brand Males, M.D., [facility].C.P Pulmonary and Critical Care Medicine Staff Physician [place] [facility] Pager: [phone], If no answer or between  15:00h - 7:00h: call [phone]  06/26/2012 4:29 PM

## 2012-06-26 NOTE — Progress Notes (Signed)
NUTRITION FOLLOW UP  Intervention:    Continue Vital AF 1.2 at 55 ml/h with Prostat 30 ml QID to provide 1984 kcals (24.5 kcals/kg ideal weight), 159 gm protein, 1071 ml free water daily.  Nutrition Dx:   Inadequate oral intake related to inability to eat as evidenced by NPO status, ongoing.   Goal:   Enteral nutrition to provide 60-70% of estimated calorie needs (22-25 kcals/kg ideal body weight) and >/= 90% of estimated protein needs, based on ASPEN guidelines for permissive underfeeding in critically ill obese individuals, met.  Monitor:   TF tolerance/adequacy, weight trend, labs, I/O  Assessment:   Per RN, patient is tolerating Vital AF 1.2 TF without problems.  Diarrhea continues, but has improved.  Patient remains intubated on ventilator support.  MV: 10.6 Temp:Temp (24hrs), Avg:98.6 F (37 C), Min:97.5 F (36.4 C), Max:100.4 F (38 C)   Height: Ht Readings from Last 1 Encounters:  06/20/12 6' (1.829 m)    Weight Status:   Wt Readings from Last 1 Encounters:  06/26/12 248 lb 3.8 oz (112.6 kg)  1/6 236 lb 1/3 244 lb  Weight is fluctuating with fluid status.  Re-estimated needs:  Kcal: 2150 Protein: >160 gm Fluid: 2.1-1.3 L  Skin: no problems noted  Diet Order: NPO; TF via OG tube:  Vital AF 1.2 at 55 ml/h with Prostat 30 ml QID to provide 1984 kcals (24.5 kcals/kg ideal weight), 159 gm protein, 1071 ml free water daily   Intake/Output Summary (Last 24 hours) at 06/26/12 1225 Last data filed at 06/26/12 0900  Gross per 24 hour  Intake 1693.95 ml  Output   1770 ml  Net -76.05 ml    Last BM: 1/9  Labs:   Lab 06/26/12 0310 06/25/12 0420 06/24/12 0400  NA 138 144 147*  K 4.1 3.6 4.3  CL 102 105 109  CO2 27 29 28   BUN 52* 54* 58*  CREATININE 1.26 1.37* 1.35  CALCIUM 9.5 9.7 9.3  MG 2.2 2.3 2.7*  PHOS 4.3 4.4 3.3  GLUCOSE 310* 159* 207*    CBG (last 3)   Basename 06/26/12 0752 06/26/12 0407 06/26/12 0003  GLUCAP 278* 285* 267*     Scheduled Meds:    . albuterol  6 puff Inhalation Q4H  . antiseptic oral rinse  15 mL Mouth Rinse QID  . chlorhexidine  15 mL Mouth Rinse BID  . dexamethasone  6 mg Intravenous Q6H  . diltiazem  90 mg Per Tube Q6H  . famotidine (PEPCID) IV  20 mg Intravenous Q24H  . feeding supplement  30 mL Per Tube QID  . furosemide  40 mg Intravenous Once  . heparin subcutaneous  5,000 Units Subcutaneous Q8H  . insulin aspart  0-15 Units Subcutaneous Q4H  . insulin aspart  5 Units Subcutaneous Q4H  . insulin glargine  50 Units Subcutaneous QHS  . linezolid  600 mg Intravenous Q12H  . LORazepam  1 mg Per Tube TID  . oseltamivir  150 mg Per Tube BID  . sodium chloride  3 mL Intravenous Q12H    Continuous Infusions:    . dexmedetomidine 0.7 mcg/kg/hr (06/26/12 0210)  . feeding supplement (VITAL AF 1.2 CAL) 1,000 mL (06/23/12 2300)    Molli Barrows, RD, LDN, [place] Pager# [phone] After Hours Pager# [phone]

## 2012-06-26 NOTE — Progress Notes (Signed)
[place] Progress Note Patient Name: Clayton [name] DOB: September 04, 1927 MRN: [id number]  Date of Service  06/26/2012   HPI/Events of Note   Extubated today  eICU Interventions  Changed to combivent qid, albuterol prn + mucomyst scheduled   Intervention Category Minor Interventions: Routine modifications to care plan (e.g. PRN medications for pain, fever)  Seaborn Nakama S. 06/26/2012, 6:14 PM

## 2012-06-26 NOTE — Progress Notes (Addendum)
Physical Therapy Treatment Patient Details Name: Clayton [name] MRN: [id number] DOB: Mar 23, 1928 Today's Date: 06/26/2012 Time: 1142-1209 PT Time Calculation (min): 27 min  PT Assessment / Plan / Recommendation Comments on Treatment Session  Pt with H1N1 and VDRF.  Continues to improve with mobility. Will continue to benefit from skilled PT to incr activity tolerance and begin ambulating.     Follow Up Recommendations  CIR           Equipment Recommendations  Rolling walker with 5" wheels    Recommendations for Other Services Rehab consult  Frequency Min 3X/week   Plan Discharge plan remains appropriate;Frequency remains appropriate    Precautions / Restrictions Precautions Precautions: Fall Restrictions Weight Bearing Restrictions: No   Pertinent Vitals/Pain HR 65-75 RR 18-20's per monitor; on vent RR up to 48 when patient coughing BP 147/52 lying, 157/43 sitting Pt on ventilator with PEEP of 5 and at 30% FiO2 in which RT incr to 40% FiO2 for pt to get OOB.    Denies pain    Mobility  Bed Mobility Bed Mobility: Supine to Sit;Sitting - Scoot to Edge of Bed Supine to Sit: With rails;3: Mod assist Sitting - Scoot to Edge of Bed: 2: Max assist;With rail Details for Bed Mobility Assistance: pt position in bed and lines/tubes significantly limit pt's bed mobility Transfers Transfers: Sit to Stand;Stand to Sit;Stand Pivot Transfers Sit to Stand: 1: +2 Total assist;From bed Sit to Stand: Patient Percentage: 80% Stand to Sit: 1: +2 Total assist;To chair/3-in-1 Stand to Sit: Patient Percentage: 80% Stand Pivot Transfers: 1: +2 Total assist Stand Pivot Transfers: Patient Percentage: 70% Details for Transfer Assistance: vc's for positioning, mainly req A for lines, vent, and chair management. Ambulation/Gait Ambulation/Gait Assistance: Not tested (comment) Stairs: No Wheelchair Mobility Wheelchair Mobility: No      PT Goals Acute Rehab PT Goals PT Goal: Rolling Supine  to Right Side - Progress: Progressing toward goal PT Goal: Rolling Supine to Left Side - Progress: Progressing toward goal PT Goal: Sit at Edge Of Bed - Progress: Progressing toward goal PT Goal: Sit to Supine/Side - Progress: Progressing toward goal PT Goal: Sit to Stand - Progress: Progressing toward goal PT Goal: Stand to Sit - Progress: Progressing toward goal PT Transfer Goal: Bed to Chair/Chair to Bed - Progress: Progressing toward goal PT Goal: Stand - Progress: Progressing toward goal PT Goal: Ambulate - Progress: Progressing toward goal  Visit Information  Last PT Received On: 06/26/12 Assistance Needed: +2    Subjective Data  Subjective: VDRF; nods yes and no, uses hand signals to express needs Patient Stated Goal: none stated    Cognition  Overall Cognitive Status: Appears within functional limits for tasks assessed/performed Arousal/Alertness: Awake/alert Orientation Level: Appears intact for tasks assessed Behavior During Session: [facility] for tasks performed    Balance  Balance Balance Assessed: Yes Static Sitting Balance Static Sitting - Balance Support: Feet supported;Bilateral upper extremity supported Static Sitting - Level of Assistance: 5: Stand by assistance Static Sitting - Comment/# of Minutes: 3-60min   End of Session PT - End of Session Equipment Utilized During Treatment: Oxygen Activity Tolerance: Patient limited by fatigue Patient left: in chair;with call bell/phone within reach;with family/visitor present Nurse Communication: Mobility status        Zannie Kehr 06/26/2012, 1:11 PM Zannie Kehr, [place] [phone]

## 2012-06-26 NOTE — Progress Notes (Signed)
Clinical Social Work  Patient remains intubated. No family present. CSW will continue to follow to assist with dc planning.   [place], [place] [phone]

## 2012-06-26 NOTE — Progress Notes (Signed)
UR Completed.  Clayton [name] Jane [phone] 06/26/2012  

## 2012-06-26 NOTE — Progress Notes (Signed)
[facility] Acute Rehabilitation [phone] (pager)

## 2012-06-26 NOTE — Procedures (Signed)
Extubation Procedure Note  Patient Details:   Name: Clayton [name] DOB: Mar 10, 1928 MRN: [id number]   Airway Documentation:     Evaluation  O2 sats: stable throughout Complications: No apparent complications Patient did tolerate procedure well. Bilateral Breath Sounds: Rhonchi Suctioning: Airway Yes  [name], [name] 06/26/2012, 7:17 PM

## 2012-06-27 DIAGNOSIS — R5381 Other malaise: Secondary | ICD-10-CM

## 2012-06-27 LAB — CBC WITH DIFFERENTIAL/PLATELET
Band Neutrophils: 2 % (ref 0–10)
HCT: 34.2 % — ABNORMAL LOW (ref 39.0–52.0)
Lymphs Abs: 0.5 10*3/uL — ABNORMAL LOW (ref 0.7–4.0)
MCH: 32.4 pg (ref 26.0–34.0)
MCHC: 33.3 g/dL (ref 30.0–36.0)
MCV: 97.2 fL (ref 78.0–100.0)
Metamyelocytes Relative: 3 %
Monocytes Absolute: 1.2 10*3/uL — ABNORMAL HIGH (ref 0.1–1.0)
Myelocytes: 4 %
Neutro Abs: 22.3 10*3/uL — ABNORMAL HIGH (ref 1.7–7.7)
Platelets: 365 10*3/uL (ref 150–400)
RDW: 12.8 % (ref 11.5–15.5)
WBC: 24 10*3/uL — ABNORMAL HIGH (ref 4.0–10.5)

## 2012-06-27 LAB — BASIC METABOLIC PANEL
CO2: 29 mEq/L (ref 19–32)
Calcium: 9.8 mg/dL (ref 8.4–10.5)
Chloride: 101 mEq/L (ref 96–112)
GFR calc Af Amer: 52 mL/min — ABNORMAL LOW (ref >90)
Glucose, Bld: 241 mg/dL — ABNORMAL HIGH (ref 70–99)
Potassium: 3.1 mEq/L — ABNORMAL LOW (ref 3.5–5.1)
Sodium: 144 mEq/L (ref 135–145)

## 2012-06-27 LAB — GLUCOSE, CAPILLARY
Glucose-Capillary: 144 mg/dL — ABNORMAL HIGH (ref 70–99)
Glucose-Capillary: 414 mg/dL — ABNORMAL HIGH (ref 70–99)
Glucose-Capillary: 371 mg/dL — ABNORMAL HIGH (ref 70–99)

## 2012-06-27 LAB — LACTIC ACID, PLASMA
Lactic Acid, Venous: 3 mmol/L — ABNORMAL HIGH (ref 0.5–2.2)
Lactic Acid, Venous: 2.9 mmol/L — ABNORMAL HIGH (ref 0.5–2.2)

## 2012-06-27 LAB — PHOSPHORUS: Phosphorus: 3.8 mg/dL (ref 2.3–4.6)

## 2012-06-27 MED ORDER — FINASTERIDE 5 MG PO TABS
5.0000 mg | ORAL_TABLET | Freq: Every day | ORAL | Status: DC
  Administered 2012-06-27 – 2012-07-02 (×6): 5 mg via ORAL
  Filled 2012-06-27 (×6): qty 1

## 2012-06-27 MED ORDER — INSULIN ASPART 100 UNIT/ML ~~LOC~~ SOLN
5.0000 [IU] | Freq: Three times a day (TID) | SUBCUTANEOUS | Status: DC
  Administered 2012-06-28 (×3): 5 [IU] via SUBCUTANEOUS

## 2012-06-27 MED ORDER — OSELTAMIVIR PHOSPHATE 6 MG/ML PO SUSR
150.0000 mg | Freq: Two times a day (BID) | ORAL | Status: AC
  Administered 2012-06-28 (×2): 150 mg via ORAL
  Filled 2012-06-27 (×2): qty 25

## 2012-06-27 MED ORDER — ZOLPIDEM TARTRATE 5 MG PO TABS: 5.0000 mg | ORAL_TABLET | Freq: Every evening | ORAL | Status: DC | PRN

## 2012-06-27 MED ORDER — POTASSIUM CHLORIDE 10 MEQ/100ML IV SOLN
10.0000 meq | INTRAVENOUS | Status: AC
  Administered 2012-06-27 (×2): 10 meq via INTRAVENOUS
  Filled 2012-06-27 (×2): qty 100

## 2012-06-27 MED ORDER — BIOTENE DRY MOUTH MT LIQD
15.0000 mL | Freq: Two times a day (BID) | OROMUCOSAL | Status: DC
  Administered 2012-06-28 – 2012-06-29 (×3): 15 mL via OROMUCOSAL

## 2012-06-27 MED ORDER — POTASSIUM CHLORIDE 20 MEQ PO PACK
40.0000 meq | PACK | Freq: Once | ORAL | Status: DC
Start: 2012-06-27 — End: 2012-06-27

## 2012-06-27 MED ORDER — INSULIN GLARGINE 100 UNIT/ML ~~LOC~~ SOLN
25.0000 [IU] | Freq: Two times a day (BID) | SUBCUTANEOUS | Status: DC
  Administered 2012-06-27 – 2012-06-30 (×7): 25 [IU] via SUBCUTANEOUS

## 2012-06-27 MED ORDER — FAMOTIDINE 40 MG/5ML PO SUSR
20.0000 mg | Freq: Every day | ORAL | Status: DC
  Administered 2012-06-28 – 2012-07-02 (×5): 20 mg via ORAL
  Filled 2012-06-27 (×5): qty 2.5

## 2012-06-27 MED ORDER — DEXAMETHASONE SODIUM PHOSPHATE 4 MG/ML IJ SOLN
2.0000 mg | Freq: Three times a day (TID) | INTRAMUSCULAR | Status: DC
  Administered 2012-06-27 – 2012-06-28 (×2): 2 mg via INTRAVENOUS
  Filled 2012-06-27 (×5): qty 0.5

## 2012-06-27 MED ORDER — POTASSIUM CHLORIDE 20 MEQ/15ML (10%) PO LIQD
40.0000 meq | Freq: Every day | ORAL | Status: DC
  Administered 2012-06-27 – 2012-07-02 (×6): 40 meq via ORAL
  Filled 2012-06-27 (×6): qty 30

## 2012-06-27 MED ORDER — SIMVASTATIN 20 MG PO TABS
20.0000 mg | ORAL_TABLET | Freq: Every day | ORAL | Status: DC
  Administered 2012-06-27 – 2012-07-01 (×5): 20 mg via ORAL
  Filled 2012-06-27 (×6): qty 1

## 2012-06-27 MED ORDER — ALBUTEROL SULFATE (5 MG/ML) 0.5% IN NEBU
2.5000 mg | INHALATION_SOLUTION | Freq: Two times a day (BID) | RESPIRATORY_TRACT | Status: DC
  Administered 2012-06-27 – 2012-07-02 (×10): 2.5 mg via RESPIRATORY_TRACT
  Filled 2012-06-27 (×10): qty 0.5

## 2012-06-27 NOTE — Progress Notes (Signed)
Physical Therapy Treatment Patient Details Name: Clayton [name] MRN: [id number] DOB: 11-28-1927 Today's Date: 06/27/2012 Time: [id number] PT Time Calculation (min): 29 min  PT Assessment / Plan / Recommendation Comments on Treatment Session  Pt with H1N1, now extubated, communicating well, and cognitively intact with minor safety awareness deficits.  Remains with some general weakness, however primary limitation is O2 sats.  Will continue to benefit from skilled PT to maximize mobility and independence.     Follow Up Recommendations  CIR           Equipment Recommendations  Rolling walker with 5" wheels    Recommendations for Other Services Rehab consult  Frequency Min 3X/week   Plan Discharge plan remains appropriate;Frequency remains appropriate    Precautions / Restrictions Precautions Precautions: Fall Restrictions Weight Bearing Restrictions: No   Pertinent Vitals/Pain HR 93-110 BP: 151/83 supine; 136/50 sitting RR: 20-24 O2 sats: quiet sitting on 4L O2 via [place] = 89-91%; with activity on 5L O2 via [place] 79-93%  Denies pain, dizziness, nausea    Mobility  Bed Mobility Bed Mobility: Supine to Sit;Sitting - Scoot to Edge of Bed Supine to Sit: 3: Mod assist;With rails Sitting - Scoot to Edge of Bed: 3: Mod assist;With rail Details for Bed Mobility Assistance: Pt able to sit up from supine well, difficulty when moving toward EOB and req hand held or railing assist to accomplish task Transfers Transfers: Sit to Stand;Stand to Sit Sit to Stand: 1: +2 Total assist;From bed Sit to Stand: Patient Percentage: 80% Stand to Sit: 1: +2 Total assist;To chair/3-in-1 Stand to Sit: Patient Percentage: 90% Details for Transfer Assistance: moves slowly, req vc for hand placement Ambulation/Gait Ambulation/Gait Assistance: 1: +2 Total assist Ambulation/Gait: Patient Percentage: 70% Ambulation Distance (Feet): 10 Feet Assistive device: Rolling walker Ambulation/Gait Assistance  Details: vc for breathing, posture, RW use; steps are deliberate and slow, req mod A for balance; O2 sats with walking dropped to 79, improved to 93 with cues to stand tall and breath in through nose out through mouth.  Gait Pattern: Step-to pattern;Trunk flexed;Decreased stride length;Decreased hip/knee flexion - right;Decreased hip/knee flexion - left;Decreased step length - right;Decreased step length - left Gait velocity: decr Stairs: No Wheelchair Mobility Wheelchair Mobility: No      PT Goals Acute Rehab PT Goals PT Goal: Rolling Supine to Right Side - Progress: Progressing toward goal PT Goal: Rolling Supine to Left Side - Progress: Progressing toward goal PT Goal: Sit at Edge Of Bed - Progress: Progressing toward goal PT Goal: Sit to Supine/Side - Progress: Progressing toward goal PT Goal: Sit to Stand - Progress: Progressing toward goal PT Goal: Stand to Sit - Progress: Progressing toward goal PT Transfer Goal: Bed to Chair/Chair to Bed - Progress: Progressing toward goal PT Goal: Stand - Progress: Progressing toward goal PT Goal: Ambulate - Progress: Progressing toward goal  Visit Information  Last PT Received On: 06/27/12 Assistance Needed: +2    Subjective Data  Subjective: A walk! (pleasantly surprised when I told him we were going to walk today) Patient Stated Goal: to feel better and go home   Cognition  Overall Cognitive Status: Impaired Area of Impairment: Awareness of deficits;Safety/judgement Arousal/Alertness: Awake/alert Orientation Level: Oriented X4 / Intact Behavior During Session: WFL for tasks performed Safety/Judgement: Decreased awareness of safety precautions;Decreased safety judgement for tasks assessed Safety/Judgement - Other Comments: Pt not fully aware of deficits, req cues to attend to sequencing and safety during session Cognition - Other Comments: Pt verbalizes understanding and able  to communicate without difficulty today    Balance   Balance Balance Assessed: Yes Static Sitting Balance Static Sitting - Balance Support: Feet supported;Bilateral upper extremity supported Static Sitting - Level of Assistance: 5: Stand by assistance Static Sitting - Comment/# of Minutes: 75min EOB, no difficulties once in sitting; initially req A to reposition and maintain upright Static Standing Balance Static Standing - Balance Support: Bilateral upper extremity supported;During functional activity Static Standing - Level of Assistance: 1: +2 Total assist Static Standing - Comment/# of Minutes: pt=80 for balance  End of Session PT - End of Session Equipment Utilized During Treatment: Oxygen Activity Tolerance: Patient limited by fatigue;Other (comment) (limited due to O2 sats) Patient left: in chair;with call bell/phone within reach;with family/visitor present Nurse Communication: Mobility status;Other (comment) (IV needs addressed)        Zannie Kehr 06/27/2012, 11:30 AM Zannie Kehr, SPT Acute Rehab Services [phone] [facility] Acute Rehabilitation [phone] (pager)

## 2012-06-27 NOTE — Progress Notes (Signed)
Per previous note, RN performed RN Stroke swallow screen on pt. RN writes "bedside swallow performed per MD order." Pt was ordered to have SLP clinical bedside swallow. Pt has not suffered a stroke, is admitted with flu, therefore he does not quality for the RN stroke swallow screen. Pt is still NPO with order from MD to advance diet as tolerated. Will complete SLP bedside swallow as ordered. Herbie [place], [place] CCC-SLP [phone]

## 2012-06-27 NOTE — Progress Notes (Signed)
Hypokalemia   K replaced  

## 2012-06-27 NOTE — Evaluation (Addendum)
Clinical/Bedside Swallow Evaluation Patient Details  Name: Clayton [name] MRN: [id number] Date of Birth: November 17, 1927  Today's Date: 06/27/2012 Time: [id number] SLP Time Calculation (min): 23 min  Past Medical History:  Past Medical History  Diagnosis Date  . Diabetes mellitus   . Hypertension   . Anemia   . Chronic renal insufficiency   . Osteoporosis   . Neuromuscular disorder   . DDD (degenerative disc disease), lumbar    Past Surgical History:  Past Surgical History  Procedure Date  . Vasectomy   . Eye surgery    HPI:  Clayton [name] is a 79 y.o., male with a PMHX of type 2 diabetes mellitus, CKD, HTN who was admitted by family medicine service on 06/19/2012 for evaluation of shortness of breath, hypoxia, and body aches x 4 days. During the workup, he was determined to be H1N1 positive. During the hospital course, the patient continued to have intermittent episodes of worsening shortness of breath. Overnight, the patient acutely decompensated at approximately 3AM this morning with increased work of breathing, hypoxia to 87% and was subsequently placed on NRB mask with persistent hypoxemia. Patient was evaluated by RRT and then Dr. Lake Bells, he subsequently required endotracheal intubation on 06/20/2012 until 06/26/12.     Assessment / Plan / Recommendation Clinical Impression  Pt demonstrates risk of aspiration related to 7 days extubation, however despite this pt with strong cough, clear vocal quality and good generalized strength and mentation, all good clinical indicators for adequate airway protection. Pt does demonstrate an intermittent immediate and delayed cough with all POs and independent of PO. Given congestion, pt likely with baseline cough that is difficult to isolate from signs of aspiration/penetration.  Given other good signs, pt is recommended to initate a Regular diet with thin liquids with basic aspriation precautions which the pt vebalized and demosntrated with  therapeutic intervention. Will f/u x1 to check for tolerance.     Aspiration Risk  Mild    Diet Recommendation Regular;Thin liquid   Liquid Administration via: Cup;No straw Medication Administration: Whole meds with liquid Supervision: Patient able to self feed;Intermittent supervision to cue for compensatory strategies Compensations: Slow rate;Small sips/bites Postural Changes and/or Swallow Maneuvers: Seated upright 90 degrees;Out of bed for meals    Other  Recommendations Oral Care Recommendations: Oral care BID   Follow Up Recommendations       Frequency and Duration min 1 x/week  2 weeks   Pertinent Vitals/Pain NA    SLP Swallow Goals Patient will consume recommended diet without observed clinical signs of aspiration with: Supervision/safety Patient will utilize recommended strategies during swallow to increase swallowing safety with: Supervision/safety   Swallow Study Prior Functional Status       General HPI: Clayton [name] is a 79 y.o., male with a PMHX of type 2 diabetes mellitus, CKD, HTN who was admitted by family medicine service on 06/19/2012 for evaluation of shortness of breath, hypoxia, and body aches x 4 days. During the workup, he was determined to be H1N1 positive. During the hospital course, the patient continued to have intermittent episodes of worsening shortness of breath. Overnight, the patient acutely decompensated at approximately 3AM this morning with increased work of breathing, hypoxia to 87% and was subsequently placed on NRB mask with persistent hypoxemia. Patient was evaluated by RRT and then Dr. Lake Bells, he subsequently required endotracheal intubation on 06/20/2012 until 06/26/12.   Type of Study: Bedside swallow evaluation Diet Prior to this Study: Regular;Thin liquids Temperature Spikes Noted:  No Respiratory Status: Supplemental O2 delivered via (comment) History of Recent Intubation: Yes Length of Intubations (days): 7 days Date extubated:  06/27/12 Behavior/Cognition: Alert Oral Cavity - Dentition: Dentures, not available;Dentures, bottom Self-Feeding Abilities: Able to feed self Patient Positioning: Upright in bed Baseline Vocal Quality: Clear Volitional Cough: Strong Volitional Swallow: Able to elicit    Oral/Motor/Sensory Function Overall Oral Motor/Sensory Function: Appears within functional limits for tasks assessed   Ice Chips     Thin Liquid Thin Liquid: Impaired Presentation: Cup;Straw Pharyngeal  Phase Impairments: Cough - Immediate;Cough - Delayed    Nectar Thick Nectar Thick Liquid: Not tested   Honey Thick Honey Thick Liquid: Not tested   Puree Puree: Impaired Presentation: Self Fed;Spoon Pharyngeal Phase Impairments: Cough - Immediate;Cough - Delayed   Solid   GO    Solid: Impaired Presentation: Self Fed Pharyngeal Phase Impairments: Cough - Delayed;Cough - Immediate       Kartik Fernando, Katherene Ponto 06/27/2012,8:46 AM

## 2012-06-27 NOTE — Progress Notes (Signed)
[facility] ADULT ICU REPLACEMENT PROTOCOL FOR AM LAB REPLACEMENT ONLY  The patient does not apply for the [facility] Adult ICU Electrolyte Replacment Protocol based on the criteria listed below:   3. Is BUN < 30 mg/dL? no  Patient's BUN today is 58 4. Abnormal electrolyte(s):K+3.1  Clayton [name] [facility] 06/27/2012 5:04 AM

## 2012-06-27 NOTE — Progress Notes (Signed)
Clinical Social Work  CSW met with patient and dtr [name]) at bedside to discuss bed offers. CSW provided SNF list with bed offers and dtr reports that [facility]. [facility] not offer at this time. CSW called [facility] and sent additional clinicals regarding if they could offer a bed. CSW informed patient and dtr to have alternative options in case [facility] is not available. CSW will continue to follow.  [place], [place] [phone]

## 2012-06-27 NOTE — Progress Notes (Signed)
Inpatient Diabetes Program Recommendations  AACE/ADA: New Consensus Statement on Inpatient Glycemic Control (2013)  Target Ranges:  Prepandial:   less than 140 mg/dL      Peak postprandial:   less than 180 mg/dL (1-2 hours)      Critically ill patients:  140 - 180 mg/dL   Reason for Visit: Results for KAMYAR, CAMMISA (MRN [id number]) as of 06/27/2012 16:28  Ref. Range 06/27/2012 08:01 06/27/2012 12:05  Glucose-Capillary Latest Range: 70-99 mg/dL 144 (H) 414 (H)   CBG's continue to be elevated.  Note patient is no longer on tube feeds and is on Dys 3 diet.  Please change Novolog meal coverage to 8 units tid with meals (Hold if patient NPO or eats less than 50%).  Called and discussed with RN.

## 2012-06-27 NOTE — Progress Notes (Signed)
Bedside swallow eval perform per MD order. Patient did great was able to swallow without any sign of aspiration.

## 2012-06-27 NOTE — Progress Notes (Signed)
Name: Clayton [name] MRN: [id number] DOB: [birth year] LOS: 9  PCCM RESIDENT DAILY PROGRESS NOTE  History of Present Illness: Clayton [name] is a 79 y.o. male with a PMHX of DM2, CKD, HTN who was admitted to [facility] on 06/19/2012 with acute respiratory failure secondary to H1N1 and requiring intubation on 06/22/2012. Now extubated since 1/9 tolerating.    Lines / Drains: ETT 01/03 >>  06/27/11 Rectal pouch 1/8 Condom cath 1/8   Cultures: Influenza Panel A 06/19/2012  H1N1 POSITIVE 1/2  MRSA nares + 1/2  Urine Cx negative  bld cultures 1/3 Neg Resp Cx 1/3 + MRSA tracheal aspirate  1/6 C.diff negative  1/8 HSV bronch> 1/8 BAL quant >>abundant wbc, +PMN, no sq cells, rare gm + cocci in pairs; ABUNDANT PMN's WITH NO ORGANISMS 1/8 Resp Cx bronch>>abundant wbc, +PMN, no sq cells, rare gm + cocci in pairs  1/8 bronch body fluid cell ct with diff: Red, turbid, 16750 wbc, 92 neutrophils, 8 monocyte-macrophages 1/8 cytology>> 1/8 surgical pathology>>bronchus biopsy with squamous cell carcinoma with necrosis (per Dr [name] this was 1 cm piece of mass that was suctioned during bronch, site possibly Bronchus intermedius   Antibiotics: Azithromycin 1/2 >>1/3  Levaquin 1/3 >>1/5  ceftriaxone 1/3>>>1/5  vanc iv 1/3>>>1/6  Vanc oral 1/6>>1/7  Tamiflu 1/2 >>  Linezolid 1/6>>  Tests / Events: 06/21/2011 - Patient developed acute respiratory failure requiring ETT  1/3- borderline BP  1/4-no overnight events except diarrhea  1/5- mrsa noted  1/6-trouble weaning, stood  1/7-trouble weaning, anxiety, seemed fatigued with PT 1/8-bronch with sig thick secretions, inflammation,erythema, tissue 1/9 tolerated extubation. Doing well resp status mid 90% O2 sat 1/10 ambulated with PT to the door but desat to the 80s    SUBJECTIVE/OVERNIGHT/INTERVAL HX Diarrhea improved. Cough not able to get up mucus/secretions up with Mucomyst No pressors, no sedation CAMM neg, RASS 0  Vital Signs: Temp:   [97.6 F (36.4 C)-99.2 F (37.3 C)] 98 F (36.7 C) (01/10 1247) Pulse Rate:  [52-101] 82  (01/10 1400) Resp:  [10-21] 20  (01/10 1400) BP: (128-173)/(36-123) 128/36 mmHg (01/10 1400) SpO2:  [88 %-98 %] 93 % (01/10 1457) FiO2 (%):  [30 %] 30 % (01/09 1600) Weight:  [244 lb 7.8 oz (110.9 kg)] 244 lb 7.8 oz (110.9 kg) (01/10 0600) I/O last 3 completed shifts: In: 2947.5 [I.V.:793.5; NG/GT:1100; IV Piggyback:1054] Out: [id number] [Urine:4335; Stool:450]    Physical Examination: General:  Lying in bed, alert, nad Neuro:  Follows commands, CAMM neg, RASS 0 HEENT:  [place]/at Cardiovascular:  RRR, no r/m/g Lungs:  Still with course breath sounds b/l Abdomen:  Obese, ntnd, normal bs Musculoskeletal:  Warm extremities, no cyanosis or edema  Skin:  Intact   Ventilator settings: Vent Mode:  [-] CPAP;PSV FiO2 (%):  [30 %] 30 % PEEP:  [5 cmH20] 5 cmH20 Pressure Support:  [10 cmH20] 10 cmH20  ABG: none  Labs and Imaging:   Basic Metabolic Panel:  Lab [id number] 06/26/12 0310  NA 144 138  K 3.1* 4.1  CL 101 102  CO2 29 27  GLUCOSE 241* 310*  BUN 58* 52*  CREATININE 1.41* 1.26  CALCIUM 9.8 9.5  MG 2.1 2.2  PHOS 3.8 4.3   Liver Function Tests:  Lab 06/21/12 0530  AST 30  ALT 19  ALKPHOS 60  BILITOT 0.2*  PROT 6.7  ALBUMIN 2.6*   CBC:  Lab 06/27/12 0325 06/26/12 0310  WBC 24.0* 17.7*  NEUTROABS 22.3* 15.9*  HGB 11.4*  10.3*  HCT 34.2* 31.5*  MCV 97.2 99.1  PLT 365 275   BNP:  Lab 06/27/12 0325 06/23/12 0455  PROBNP 2345.0* 2155.0*   CBG:  Lab 06/27/12 1205 06/27/12 0801 06/27/12 0409 06/27/12 0014 06/26/12 2020 06/26/12 1607  GLUCAP 414* 144* 190* 322* 274* 338*   Coagulation:  Lab 06/20/12 1620  LABPROT 14.4  INR 1.14   Anemia Panel:  Lab 06/24/12 1621  VITAMINB12 --  FOLATE 10.6  FERRITIN --  TIBC --  IRON --  RETICCTPCT --    Misc. Labs: none  Assessment and Plan: PULMONARY   ASSESSMENT:  1) VDRF - 2/2 H1N1 + and influenza A + with possible  superimposed pneumonia (CAP) with left lower lobe consolidation-tolerating extubation 1/9 2) LLL 67mm pulmonary nodule - noted on CT angiogram, will require 1 year follow chest CT in setting of prior tobacco abuse.  3) +MRSA tracheal aspirate 1/3  4) BAL bronchus suction 1cm mass from possibly bronchus intermedius 06/26/11  with squamous cell carcinoma with abundant necrosis  PLAN:  - monitor In ICU 06/28/11 resp status - OPD fu for new malignant diagnosis (patient and family informed by Dr [name]) -On decadon 6 q6 after bronch 1/8 will decreased to 2 mg tid and consider titrate off tomorrow.   CARDIOVASCULAR  06/20/12 echo EF 55-60%  ASSESSMENT:  1) Hypertension -intermittently hypertensive  2) Hyperlipidemia  3) BNP stable 2155-->2345  PLAN:  -Continue to monitor. Diltiazem 90 mg q6. Consider resuming Lasix 20 qd (home dose) when Cr. Improves  -resumed Zocor 20 mg qhs  -strict i/o, daily wts   RENAL   Lab 06/27/12 0325 06/26/12 0310 06/25/12 0420 06/24/12 0400 06/23/12 0455  NA 144 138 144 147* 149*  K 3.1* 4.1 -- -- --  CL 101 102 105 109 108  CO2 29 27 29 28 30   GLUCOSE 241* 310* 159* 207* 168*  BUN 58* 52* 54* 58* 54*  CREATININE 1.41* 1.26 1.37* 1.35 1.54*  CALCIUM 9.8 9.5 9.7 9.3 9.5  MG 2.1 2.2 2.3 2.7* 2.6*  PHOS 3.8 4.3 4.4 3.3 3.1    ASSESSMENT:  1) acute on CKD (unknown baseline)-Cr slightly elevated 1.41 2) BPH 3) Hypokalemia   PLAN:  -Consider outpatient MRI renal for renal cysts  -hold Lasix and medications that affect kidney function -resumed Finesteride 5 mg qd  -repleted K  GASTROINTESTINAL  ASSESSMENT:  Loose stool improving Nutrition  GI px   PLAN:  Imodium 2 mg prn not to exceed 16 mg/day  Passed swallow soft mechanical diet while no teeth with carb modified. Once pt gets teeth resume carb mod diet pepcid GI px   HEMATOLOGIC  Lab 06/27/12 0325 06/26/12 0310 06/25/12 0420  HGB 11.4* 10.3* 10.3*  HCT 34.2* 31.5* 31.9*  WBC 24.0* 17.7*  18.7*  PLT 365 275 260    Assessment  Anemia of critical illness Leukocytosis PLAN  - - PRBC for hgb </= 6.9gm%    - exceptions are   -  if ACS susepcted/confirmed then transfuse for hgb </= 8.0gm%,  or    -  If septic shock first 24h and scvo2 < 70% then transfuse for hgb </= 9.0gm%   - active bleeding with hemodynamic instability, then transfuse regardless of hemoglobin value   At at all times try to transfuse 1 unit prbc as possible with exception of active hemorrhage    ONCOLOGY Bronchus biopsy 1/8 with squamous cell carcinoma lung with abundant necrosis possibly a Bronchus Intermedium endobronchial lesion that  came off automatically during suctin bronch  ASSESSMENT:  - OPD fu for cancer in several weeks  PLAN:  Sub q hep    INFECTIOUS  ASSESSMENT:  1) H1N1 positive and Influenza A +  2) +MRSA tracheal aspirate 1/3. Leukocytosis trending up  PLAN:  -Continue tamilfu 150 bid (total days Tamiflu Day 9/10)  -Continue Linazolid, plts wnl  -pending HSV, BAL quant, Resp Cx, cytology  ENDOCRINE  ASSESSMENT:  1) DM2, unknown baseline A1c now 7.4% still hyperglycemia fsbs ranging 140s-330s. Likely elevated due to Decadron  PLAN:  1)SSI, Lantus 25 mg bid, Novolog 5 units q4. Monitor cbg  NEUROLOGIC  ASSESSMENT:  Alert and oriented. CAMM neg, RASS 0  PLAN:  Monitor mental status. Off pressors and sedation since 1/9  CLINICAL SUMMARY: Extubated tolerating. +H1N1 and influenza A + possible superimposed CAP with mrsa, +tracheal aspirate with MRSA; Continue linezolid and Tamiflu, loose stools ceasing, give prn Imodium, hyperglycemic Lantus 25 mg bid, Novolog, SSI. PT following please ambulate.  New diagnosis of squamous cell carcinoma with necrosis.  Will update family today and have a family meeting. Monitor 1 more day and consider transfer out of unit 1/11.  Home meds added (Zocor, Proscar). Hold Fosamax in the hospital setting to resume when d/c   Best practices /  Disposition: -->ICU status under PCCM -->full code -->Heparin for DVT Px -->Pepcid for GI Px -->diet-mech soft carb mod -->family will be updated at bedside at 4 PM   Cresenciano Genre [id number] 06/27/2012, 3:12 PM   STAFF NOTE: I, Dr Ann Lions have personally reviewed patient's available data, including medical history, events of note, physical examination and test results as part of my evaluation. I have discussed with resident/NP and other care providers such as pharmacist, RN and RRT.  In addition,  I personally evaluated patient and elicited key findings of  MRSA and H1N1 pneumonia doing well 06/27/2012 after extubation 06/27/11. Will hold lasix due to bump in creatinine. He has new diagnosis of lung cancer (see above) that I have discussed in detail with him and family. OPD fu for this for staging.   Rest per NP/medical resident whose note is outlined above and that I agree with     Dr. Brand Males, M.D., [facility].C.P Pulmonary and Critical Care Medicine Staff Physician [place] [facility] Pager: [phone], If no answer or between  15:00h - 7:00h: call [phone]  06/27/2012 5:17 PM

## 2012-06-28 ENCOUNTER — Inpatient Hospital Stay (HOSPITAL_COMMUNITY): Payer: Medicare PPO

## 2012-06-28 LAB — GLUCOSE, CAPILLARY
Glucose-Capillary: 181 mg/dL — ABNORMAL HIGH (ref 70–99)
Glucose-Capillary: 288 mg/dL — ABNORMAL HIGH (ref 70–99)
Glucose-Capillary: 293 mg/dL — ABNORMAL HIGH (ref 70–99)

## 2012-06-28 LAB — CBC WITH DIFFERENTIAL/PLATELET
Basophils Absolute: 0 10*3/uL (ref 0.0–0.1)
Eosinophils Relative: 0 % (ref 0–5)
Lymphocytes Relative: 4 % — ABNORMAL LOW (ref 12–46)
Monocytes Relative: 9 % (ref 3–12)
Neutrophils Relative %: 87 % — ABNORMAL HIGH (ref 43–77)
Platelets: 334 10*3/uL (ref 150–400)
RBC: 3.23 MIL/uL — ABNORMAL LOW (ref 4.22–5.81)
RDW: 12.8 % (ref 11.5–15.5)
WBC: 21.1 10*3/uL — ABNORMAL HIGH (ref 4.0–10.5)

## 2012-06-28 LAB — PROCALCITONIN: Procalcitonin: <0.1 ng/mL

## 2012-06-28 LAB — BASIC METABOLIC PANEL
Calcium: 8.7 mg/dL (ref 8.4–10.5)
Chloride: 99 mEq/L (ref 96–112)
Creatinine, Ser: 1.41 mg/dL — ABNORMAL HIGH (ref 0.50–1.35)
GFR calc Af Amer: 52 mL/min — ABNORMAL LOW (ref >90)
Sodium: 138 mEq/L (ref 135–145)

## 2012-06-28 LAB — LACTIC ACID, PLASMA: Lactic Acid, Venous: 2.8 mmol/L — ABNORMAL HIGH (ref 0.5–2.2)

## 2012-06-28 MED ORDER — POTASSIUM CHLORIDE CRYS ER 20 MEQ PO TBCR: 40.0000 meq | EXTENDED_RELEASE_TABLET | Freq: Once | ORAL | Status: DC

## 2012-06-28 MED ORDER — POTASSIUM CHLORIDE CRYS ER 20 MEQ PO TBCR
20.0000 meq | EXTENDED_RELEASE_TABLET | Freq: Once | ORAL | Status: AC
  Administered 2012-06-28: 20 meq via ORAL
  Filled 2012-06-28: qty 1

## 2012-06-28 MED ORDER — FUROSEMIDE 10 MG/ML IJ SOLN
40.0000 mg | Freq: Once | INTRAMUSCULAR | Status: AC
  Administered 2012-06-28: 40 mg via INTRAVENOUS
  Filled 2012-06-28: qty 4

## 2012-06-28 NOTE — Progress Notes (Signed)
Micro reviewed  Staph aureus pos BAL.   Currently on linezolid. No further intervention at this time.

## 2012-06-28 NOTE — Progress Notes (Signed)
Name: Clayton [name] MRN: [id number] DOB: [birth year] LOS: 29  PCCM RESIDENT DAILY PROGRESS NOTE  History of Present Illness: [name] is a 79 y.o. male with a PMHX of DM2, CKD, HTN who was admitted to [facility] on 06/19/2012 with acute respiratory failure secondary to H1N1 and requiring intubation on 06/22/2012. Now extubated since 1/9 tolerating.    Lines / Drains: ETT 01/03 >>  06/27/11 Rectal pouch 1/8>> Condom cath 1/8>>   Cultures: 1/2 Influenza A>>>POSITIVE 1/2 H1N1>> POSITIVE  1/2 MRSA PCR>>>POSITIVE  1/3 UC>>>neg 1/3 BCx2>>>Neg 1/3 Resp Cx>> + MRSA tracheal aspirate  1/6 C.diff negative  1/8 HSV bronch> 1/8 BAL quant >>abundant wbc, +PMN, no sq cells, rare gm + cocci in pairs; ABUNDANT PMN's WITH NO ORGANISMS 1/8 Resp Cx bronch>>abundant wbc, +PMN, no sq cells, rare gm + cocci in pairs  1/8 bronch body fluid cell ct with diff: Red, turbid, 16750 wbc, 92 neutrophils, 8 monocyte-macrophages 1/8 cytology>> 1/8 surgical pathology>>bronchus biopsy with squamous cell carcinoma with necrosis (per Dr [name] this was 1 cm piece of mass that was suctioned during bronch, site possibly Bronchus intermedius   Antibiotics: Azithromycin 1/2 >>1/3  Levaquin 1/3 >>1/5  ceftriaxone 1/3>>>1/5  vanc iv 1/3>>>1/6  Vanc oral 1/6>>1/7  Tamiflu 1/2 >>1/11 Linezolid 1/6>>  Tests / Events: 1/3/ - Patient developed acute respiratory failure requiring ETT, borderline BP  1/4 - no overnight events except diarrhea  1/5 - mrsa noted  1/6 - trouble weaning, stood  1/7 - trouble weaning, anxiety, seemed fatigued with PT 1/8 - bronch with sig thick secretions, inflammation,erythema, tissue 1/9 - tolerated extubation. Doing well resp status mid 90% O2 sat 1/10 - ambulated with PT to the door but desat to the 80s    SUBJECTIVE / [name] Genre HX:     06/28/2012: RN reports congested, wet cough but not clearing secretions.  No acute events.        Vital Signs: Temp:  [97.5 F (36.4  C)-99.1 F (37.3 C)] 97.9 F (36.6 C) (01/11 0800) Pulse Rate:  [52-97] 72  (01/11 0700) Resp:  [14-26] 21  (01/11 0700) BP: (127-173)/(36-123) 138/63 mmHg (01/11 0700) SpO2:  [90 %-97 %] 92 % (01/11 0700) Weight:  [249 lb 5.4 oz (113.1 kg)] 249 lb 5.4 oz (113.1 kg) (01/11 0439) I/O last 3 completed shifts: In: 1804 [I.V.:350; IV Piggyback:1454] Out: [id number] [Urine:3482; Stool:60]    Physical Examination: General:  Lying in bed, alert, nad Neuro:  Alert / oriented, MAE HEENT:  [place]/at Cardiovascular:  RRR, no r/m/g Lungs:  Still with course breath sounds b/l Abdomen:  Obese, ntnd, normal bs Musculoskeletal:  Warm extremities, no cyanosis or edema  Skin:  Intact   Ventilator settings:    Labs and Imaging:   Basic Metabolic Panel:  Lab [id number] 06/27/12 0325 06/26/12 0310  NA 138 144 --  K 3.8 3.1* --  CL 99 101 --  CO2 28 29 --  GLUCOSE 264* 241* --  BUN 54* 58* --  CREATININE 1.41* 1.41* --  CALCIUM 8.7 9.8 --  MG -- 2.1 2.2  PHOS -- 3.8 4.3   CBC:  Lab 06/28/12 0450 06/27/12 0325  WBC 21.1* 24.0*  NEUTROABS 18.4* 22.3*  HGB 10.3* 11.4*  HCT 31.2* 34.2*  MCV 96.6 97.2  PLT 334 365   BNP:  Lab 06/27/12 0325 06/23/12 0455  PROBNP 2345.0* 2155.0*   CBG:  Lab 06/28/12 0729 06/28/12 0413 06/28/12 0008 06/27/12 2006 06/27/12 1604 06/27/12 1205  GLUCAP 181*  245* 314* 370* 371* 414*   Anemia Panel:  Lab 06/24/12 1621  VITAMINB12 --  FOLATE 10.6  FERRITIN --  TIBC --  IRON --  RETICCTPCT --    Misc. Labs: none  Assessment and Plan:  PULMONARY    ASSESSMENT:  1) VDRF - 2/2 H1N1 + and influenza A + with possible superimposed pneumonia (CAP) with left lower lobe consolidation-tolerating extubation 1/9 2) LLL 92mm pulmonary nodule - noted on CT angiogram, will require 1 year follow chest CT in setting of prior tobacco abuse.  3) +MRSA tracheal aspirate 1/3  4) BAL bronchus suction 1cm mass from possibly bronchus intermedius 06/26/11  with squamous  cell carcinoma with abundant necrosis  PLAN:  - monitor In ICU 06/28/12 resp status - OPD fu for new malignant diagnosis (patient and family informed by Dr [name]) - d/c decadron - add flutter, not able to pull IS  - OOB to chair  CARDIOVASCULAR  06/20/12 echo EF 55-60%  ASSESSMENT:  1) Hypertension -intermittently hypertensive  2) Hyperlipidemia  3) BNP stable 2155-->2345  PLAN:  -Continue to monitor.  -Diltiazem 90 mg q6.  -Lasix x1 1/11 -Zocor 20 mg qhs  -strict i/o, daily wts   RENAL   Lab 06/28/12 0450 06/27/12 0325 06/26/12 0310 06/25/12 0420 06/24/12 0400 06/23/12 0455  NA 138 144 138 144 147* --  K 3.8 3.1* -- -- -- --  CL 99 101 102 105 109 --  CO2 28 29 27 29 28  --  GLUCOSE 264* 241* 310* 159* 207* --  BUN 54* 58* 52* 54* 58* --  CREATININE 1.41* 1.41* 1.26 1.37* 1.35 --  CALCIUM 8.7 9.8 9.5 9.7 9.3 --  MG -- 2.1 2.2 2.3 2.7* 2.6*  PHOS -- 3.8 4.3 4.4 3.3 3.1    ASSESSMENT:  1) acute on CKD (unknown baseline)-Cr slightly elevated 1.41 2) BPH 3) Hypokalemia   PLAN:  -Consider outpatient renal MRI for renal cysts  -Finesteride 5 mg qd  -repleted K  GASTROINTESTINAL  No results found for this basename: AST:5,ALT:5,ALKPHOS:5,BILITOT:5,PROT:5,ALBUMIN:5,INR:5 in the last 168 hours  ASSESSMENT:  Loose stool improving Nutrition  GI px   PLAN:  -Imodium 2 mg prn not to exceed 16 mg/day  -soft mechanical diet while no teeth with carb modified. Once pt gets teeth resume carb mod diet -pepcid GI px   HEMATOLOGIC  Lab 06/28/12 0450 06/27/12 0325 06/26/12 0310  HGB 10.3* 11.4* 10.3*  HCT 31.2* 34.2* 31.5*  WBC 21.1* 24.0* 17.7*  PLT 334 365 275    ASSESSMENT: Anemia of critical illness Leukocytosis  PLAN -SQ heparin   PRBC for hgb </= 6.9gm%   - exceptions are  -  if ACS susepcted/confirmed then transfuse for hgb </= 8.0gm%,  or   -  If septic shock first 24h and scvo2 < 70% then transfuse for hgb </= 9.0gm%  - active bleeding with  hemodynamic instability, then transfuse regardless of hemoglobin value   At at all times try to transfuse 1 unit prbc as possible with exception of active hemorrhage    ONCOLOGY Bronchus biopsy 1/8 with squamous cell carcinoma lung with abundant necrosis possibly a Bronchus Intermedium endobronchial lesion that came off automatically during suction bronch  ASSESSMENT:  Bronchial squamous cell carcinoma  PLAN:  - OPD fu for cancer in several weeks with Dr [name] as discussed with family on 06/27/12   INFECTIOUS   ASSESSMENT:  1) H1N1 positive and Influenza A +  2) +MRSA tracheal aspirate 1/3. Leukocytosis trending up  PLAN:  -Continue tamilfu 150 bid (total days Tamiflu Day 10/10)  -Continue Linazolid, plts wnl  -pending HSV, BAL quant, Resp Cx, cytology -mucomyst end time added  ENDOCRINE  ASSESSMENT:  1) DM2 - unknown baseline A1c at admit 7.4% still hyperglycemia fsbs ranging 140s-330s. Likely elevated due to Decadron  PLAN:  -SSI, Lantus 25 mg bid, Novolog 5 units q4.  -Monitor cbg -D/c decadron  NEUROLOGIC   ASSESSMENT:  Alert and oriented. CAMM neg, RASS 0  PLAN:  Monitor mental status. Off pressors and sedation since 1/9  CLINICAL SUMMARY: Extubated tolerating but tenuous resp status. +H1N1 and influenza A + possible superimposed CAP with mrsa, +tracheal aspirate with MRSA; Continue linezolid and Tamiflu, loose stools ceasing, give prn Imodium, hyperglycemic with decadron. Mobilize.  New diagnosis of squamous cell carcinoma with necrosis.  Hold Fosamax in the hospital setting to resume when d/c.  Transfer out of ICU in am 1/12.   Best practices / Disposition: -->ICU status under PCCM; keep in Icu 06/28/2012 -->full code -->Heparin for DVT Px -->Pepcid for GI Px -->diet-mech soft carb mod -->family updated 06/27/12       Dr. Brand Males, M.D., [facility].C.P Pulmonary and Critical Care Medicine Staff Physician [place] [facility]  [facility] Pager: [phone], If no answer or between  15:00h - 7:00h: call [phone]  06/28/2012 1:09 PM

## 2012-06-29 ENCOUNTER — Inpatient Hospital Stay (HOSPITAL_COMMUNITY): Payer: Medicare PPO

## 2012-06-29 LAB — CULTURE, BAL-QUANTITATIVE W GRAM STAIN

## 2012-06-29 LAB — CBC WITH DIFFERENTIAL/PLATELET
Eosinophils Relative: 0 % (ref 0–5)
HCT: 31.8 % — ABNORMAL LOW (ref 39.0–52.0)
Lymphs Abs: 2.6 10*3/uL (ref 0.7–4.0)
MCV: 96.7 fL (ref 78.0–100.0)
Monocytes Relative: 7 % (ref 3–12)
Neutro Abs: 14.7 10*3/uL — ABNORMAL HIGH (ref 1.7–7.7)
RBC: 3.29 MIL/uL — ABNORMAL LOW (ref 4.22–5.81)
WBC: 18.6 10*3/uL — ABNORMAL HIGH (ref 4.0–10.5)

## 2012-06-29 LAB — CULTURE, RESPIRATORY W GRAM STAIN

## 2012-06-29 LAB — BASIC METABOLIC PANEL
CO2: 29 mEq/L (ref 19–32)
Chloride: 99 mEq/L (ref 96–112)
Creatinine, Ser: 1.33 mg/dL (ref 0.50–1.35)
Sodium: 139 mEq/L (ref 135–145)

## 2012-06-29 LAB — GLUCOSE, CAPILLARY
Glucose-Capillary: 139 mg/dL — ABNORMAL HIGH (ref 70–99)
Glucose-Capillary: 214 mg/dL — ABNORMAL HIGH (ref 70–99)
Glucose-Capillary: 272 mg/dL — ABNORMAL HIGH (ref 70–99)

## 2012-06-29 LAB — PRO B NATRIURETIC PEPTIDE: Pro B Natriuretic peptide (BNP): 979.8 pg/mL — ABNORMAL HIGH (ref 0–450)

## 2012-06-29 MED ORDER — INSULIN ASPART 100 UNIT/ML ~~LOC~~ SOLN
8.0000 [IU] | Freq: Three times a day (TID) | SUBCUTANEOUS | Status: DC
  Administered 2012-06-29 – 2012-07-02 (×10): 8 [IU] via SUBCUTANEOUS

## 2012-06-29 MED ORDER — DOXYCYCLINE HYCLATE 100 MG PO TABS
100.0000 mg | ORAL_TABLET | Freq: Two times a day (BID) | ORAL | Status: DC
  Administered 2012-06-29 – 2012-07-02 (×7): 100 mg via ORAL
  Filled 2012-06-29 (×10): qty 1

## 2012-06-29 MED ORDER — DILTIAZEM 12 MG/ML ORAL SUSPENSION
90.0000 mg | Freq: Four times a day (QID) | ORAL | Status: DC
  Administered 2012-06-29 – 2012-07-02 (×10): 90 mg via ORAL
  Filled 2012-06-29 (×15): qty 9

## 2012-06-29 MED ORDER — FUROSEMIDE 20 MG PO TABS
20.0000 mg | ORAL_TABLET | Freq: Once | ORAL | Status: AC
  Administered 2012-06-29: 20 mg via ORAL
  Filled 2012-06-29: qty 1

## 2012-06-29 MED ORDER — FUROSEMIDE 20 MG PO TABS
20.0000 mg | ORAL_TABLET | Freq: Once | ORAL | Status: AC
  Administered 2012-06-29: 20 mg via ORAL
  Filled 2012-06-29 (×2): qty 1

## 2012-06-29 NOTE — Progress Notes (Signed)
Name: JERED VILORIA MRN: [id number] DOB: [birth year] LOS: 67  PCCM RESIDENT DAILY PROGRESS NOTE  History of Present Illness: [name] is a 79 y.o. male with a PMHX of DM2, CKD, HTN who was admitted to [facility] on 06/19/2012 with acute respiratory failure secondary to H1N1 and requiring intubation on 06/22/2012. Now extubated since 1/9 tolerating.    Lines / Drains: ETT 01/03 >>  06/27/11 Rectal pouch 1/8>> Condom cath 1/8>>out   Cultures: 1/2 Influenza A>>>POSITIVE 1/2 H1N1>> POSITIVE  1/2 MRSA PCR>>>POSITIVE  1/3 UC>>>neg 1/3 BCx2>>>Neg 1/3 Resp Cx>> + MRSA tracheal aspirate  1/6 C.diff negative  1/8 HSV bronch> 1/8 BAL quant >>staph aureus 1/8 Resp Cx bronch>>staph aureus 1/8 bronch body fluid cell ct with diff: Red, turbid, 16750 wbc, 92 neutrophils, 8 monocyte-macrophages 1/8 surgical pathology>>bronchus biopsy with squamous cell carcinoma with necrosis (per Dr [name] this was 1 cm piece of mass that was suctioned during bronch, site possibly Bronchus intermedius   Antibiotics: Azithromycin 1/2 >>1/3  Levaquin 1/3 >>1/5  ceftriaxone 1/3>>>1/5  vanc iv 1/3>>>1/6  Vanc oral 1/6>>1/7  Tamiflu 1/2 >>1/11 Linezolid 1/6>>1/12 Doxycycline 1/12>>end 1/15  Tests / Events: 1/3/ - Patient developed acute respiratory failure requiring ETT, borderline BP  1/4 - no overnight events except diarrhea  1/5 - mrsa noted  1/6 - trouble weaning, stood  1/7 - trouble weaning, anxiety, seemed fatigued with PT 1/8 - bronch with sig thick secretions, inflammation,erythema, tissue 1/9 - tolerated extubation. Doing well resp status mid 90% O2 sat 1/10 - ambulated with PT to the door but desat to the 80s  1/11-doing well on 4L O2 intermittently   SUBJECTIVE / [name] Genre HX:     06/29/2012: no acute events.  doing well on 4L O2 intermittently   Vital Signs: Temp:  [97.2 F (36.2 C)-99 F (37.2 C)] 98.2 F (36.8 C) (01/12 0800) Pulse Rate:  [66-86] 81  (01/12 1100) Resp:   [15-21] 20  (01/12 1100) BP: (104-158)/(43-74) 139/53 mmHg (01/12 1100) SpO2:  [89 %-98 %] 91 % (01/12 1100) I/O last 3 completed shifts: In: 2110 [P.O.:1320; I.V.:190; IV Piggyback:600] Out: [id number] [Urine:3497]    Physical Examination: General:  Lying in bed, alert, nad Neuro:  A&O x 3 HEENT:  [place]/at Cardiovascular:  RRR, no r/m/g Lungs:  ctab Abdomen:  Obese, ntnd, normal bs Musculoskeletal:  Warm extremities, no cyanosis or edema  Skin:  Intact     Labs and Imaging:   Basic Metabolic Panel:  Lab [id number] 06/28/12 0450 06/27/12 0325 06/26/12 0310  NA 139 138 -- --  K 3.9 3.8 -- --  CL 99 99 -- --  CO2 29 28 -- --  GLUCOSE 139* 264* -- --  BUN 46* 54* -- --  CREATININE 1.33 1.41* -- --  CALCIUM 8.7 8.7 -- --  MG -- -- 2.1 2.2  PHOS -- -- 3.8 4.3   CBC:  Lab 06/29/12 0505 06/28/12 0450  WBC 18.6* 21.1*  NEUTROABS 14.7* 18.4*  HGB 10.7* 10.3*  HCT 31.8* 31.2*  MCV 96.7 96.6  PLT 325 334   BNP:  Lab 06/29/12 0839 06/27/12 0325 06/23/12 0455  PROBNP 979.8* 2345.0* 2155.0*   CBG:  Lab 06/29/12 0743 06/29/12 0432 06/29/12 0003 06/28/12 1931 06/28/12 1537 06/28/12 1212  GLUCAP 121* 139* 272* 293* 288* 322*   Anemia Panel:  Lab 06/24/12 1621  VITAMINB12 --  FOLATE 10.6  FERRITIN --  TIBC --  IRON --  RETICCTPCT --    Misc. Labs: none  Assessment and Plan:  PULMONARY   1/12 CXR Worsening left base atelectasis / consolidation compared to yesterday  ASSESSMENT:  1) VDRF - extubated 1/9.  Intubated 2/2 H1N1 + and influenza A + with possible superimposed pneumonia (CAP) with left lower lobe consolidation 2) LLL 74mm pulmonary nodule - noted on CT angiogram, will require 1 year follow chest CT in setting of prior tobacco abuse.  3) +MRSA tracheal aspirate 1/3 and staph aureas BAL 4) BAL bronchus suction 1cm mass from possibly bronchus intermedius 06/26/11  with squamous cell carcinoma with abundant necrosis  PLAN:  - outpatient fu for new malignant  diagnosis; with Dr [name] pulmonary as discussed with family - flutter valve  CARDIOVASCULAR  06/20/12 echo EF 55-60%  ASSESSMENT:  1) Hypertension -BP more controlled  2) Hyperlipidemia  3) BNP elevated trending down  PLAN:  -Continue to monitor BP -Diltiazem 90 mg q6. He was previously on XR formulation and another agent at home -Zocor 20 mg qhs  -strict i/o, daily wts  -Lasix 40 mg orally given today.  [facility] to consider adding Lasix home dose scheduled.   RENAL   Lab 06/29/12 0505 06/28/12 0450 06/27/12 0325 06/26/12 0310 06/25/12 0420 06/24/12 0400 06/23/12 0455  NA 139 138 144 138 144 -- --  K 3.9 3.8 -- -- -- -- --  CL 99 99 101 102 105 -- --  CO2 29 28 29 27 29  -- --  GLUCOSE 139* 264* 241* 310* 159* -- --  BUN 46* 54* 58* 52* 54* -- --  CREATININE 1.33 1.41* 1.41* 1.26 1.37* -- --  CALCIUM 8.7 8.7 9.8 9.5 9.7 -- --  MG -- -- 2.1 2.2 2.3 2.7* 2.6*  PHOS -- -- 3.8 4.3 4.4 3.3 3.1    ASSESSMENT:  1) acute on CKD (unknown baseline)-Cr 1.33 2) BPH  PLAN:  -Consider outpatient renal MRI for renal cysts  -Finesteride 5 mg qd   GASTROINTESTINAL  ASSESSMENT:  Diarrhea improved  Nutrition  GI px   PLAN:  -Imodium 2 mg prn not to exceed 16 mg/day. Consider removal of rectal pouch  -carb mod diet  -pepcid GI px   HEMATOLOGIC  Lab 06/29/12 0505 06/28/12 0450 06/27/12 0325  HGB 10.7* 10.3* 11.4*  HCT 31.8* 31.2* 34.2*  WBC 18.6* 21.1* 24.0*  PLT 325 334 365    ASSESSMENT: Anemia of critical illness. Normocytic anemia  Leukocytosis, trending down  PLAN -SQ heparin   ONCOLOGY Bronchus biopsy 1/8 with squamous cell carcinoma lung with abundant necrosis possibly a Bronchus Intermedium endobronchial lesion that came off automatically during suction bronch  ASSESSMENT:  Bronchial squamous cell carcinoma  PLAN:  - OP fu for cancer with pulmonary, needs rpeat CT chest 2nd week feb 2014  INFECTIOUS  ASSESSMENT:  1) H1N1 positive and Influenza A + .  S/p 10 day Rx tamiflu 2) +MRSA tracheal aspirate 1/3 and Staph aureus  PLAN:    Will d/c Linazolid add Doxy 100 mg bid x 4 more days duration of 14 days total -pending HSV and follow sensitivities  -mucomyst  ENDOCRINE  ASSESSMENT:  1) DM2 - unknown baseline A1c at admit 7.4% still hyperglycemic fsbs ranging 130s-320s.  PLAN:  -SSI, Lantus 25 mg bid, Novolog 5 increased to 8 units with meals  -Monitor cbg  NEUROLOGIC  ASSESSMENT:  Alert and oriented x 3  PLAN:  Monitor mental status. Off pressors and sedation since 1/9  CLINICAL SUMMARY: Extubated 1/9 tolerating.  +H1N1 and influenza A + possible superimposed CAP  with mrsa, +tracheal aspirate with MRSA and staph aureus; Continue linezolid will change to Doxy 100 mg bid x 4 more days, still hyperglycemic increased meal time Insulin. Mobilize.  New diagnosis of squamous cell carcinoma with necrosis outpt f/u.  Hold Fosamax in the hospital setting to resume when d/c.  Transfer out of ICU today. Continue with PT/OT.  [facility] to take over care please decide whether to adde home dose of Lasix back to medications.    Best practices / Disposition: -->ICU status under PCCM -->full code -->Heparin for DVT Px -->Pepcid for GI Px -->diet-carb mod -->family at bedside will be updated today  Karlyn Agee MD PGY 1 [phone]     STAFF NOTE: I, Dr Ann Lions have personally reviewed patient's available data, including medical history, events of note, physical examination and test results as part of my evaluation. I have discussed with resident/NP and other care providers such as pharmacist, RN and RRT.  In addition,  I personally evaluated patient and elicited key findings of flu with MRSA CAP. Doing well. Move to floor today. Change abx to po  Doxy and complete out 14 day Rx for MRSA CAp. Will need opd fu for new lung cancer diagnosis. Needs CT chest around mid feb 2014.  Rest per NP/medical resident whose note is outlined above and that I  agree with  .  Dr. Brand Males, M.D., [facility].C.P Pulmonary and Critical Care Medicine Staff Physician [place] [facility] Pager: [phone], If no answer or between  15:00h - 7:00h: call [phone]  06/29/2012 3:55 PM

## 2012-06-29 NOTE — H&P (Signed)
Report given to Violeta Gelinas on 6 north room 1. Patients vitals stable. Family at the bedside given updates. Belongings with patient.

## 2012-06-30 LAB — BASIC METABOLIC PANEL
BUN: 42 mg/dL — ABNORMAL HIGH (ref 6–23)
GFR calc non Af Amer: 45 mL/min — ABNORMAL LOW (ref >90)
Glucose, Bld: 106 mg/dL — ABNORMAL HIGH (ref 70–99)
Potassium: 4.2 mEq/L (ref 3.5–5.1)

## 2012-06-30 LAB — GLUCOSE, CAPILLARY
Glucose-Capillary: 100 mg/dL — ABNORMAL HIGH (ref 70–99)
Glucose-Capillary: 239 mg/dL — ABNORMAL HIGH (ref 70–99)
Glucose-Capillary: 76 mg/dL (ref 70–99)

## 2012-06-30 LAB — HERPES SIMPLEX VIRUS CULTURE: Culture: NOT DETECTED

## 2012-06-30 LAB — CBC WITH DIFFERENTIAL/PLATELET
Basophils Relative: 0 % (ref 0–1)
Eosinophils Absolute: 0.2 10*3/uL (ref 0.0–0.7)
Hemoglobin: 10.9 g/dL — ABNORMAL LOW (ref 13.0–17.0)
Lymphs Abs: 1.9 10*3/uL (ref 0.7–4.0)
MCH: 32.7 pg (ref 26.0–34.0)
MCHC: 33.6 g/dL (ref 30.0–36.0)
MCV: 97.3 fL (ref 78.0–100.0)
Monocytes Absolute: 1.9 10*3/uL — ABNORMAL HIGH (ref 0.1–1.0)
Neutro Abs: 12.9 10*3/uL — ABNORMAL HIGH (ref 1.7–7.7)
Neutrophils Relative %: 77 % (ref 43–77)

## 2012-06-30 LAB — PRO B NATRIURETIC PEPTIDE: Pro B Natriuretic peptide (BNP): 665.5 pg/mL — ABNORMAL HIGH (ref 0–450)

## 2012-06-30 MED ORDER — INSULIN ASPART 100 UNIT/ML ~~LOC~~ SOLN
0.0000 [IU] | Freq: Three times a day (TID) | SUBCUTANEOUS | Status: DC
  Administered 2012-06-30: 3 [IU] via SUBCUTANEOUS
  Administered 2012-07-01 – 2012-07-02 (×3): 2 [IU] via SUBCUTANEOUS

## 2012-06-30 MED ORDER — GLUCERNA SHAKE PO LIQD
237.0000 mL | Freq: Two times a day (BID) | ORAL | Status: DC
  Administered 2012-06-30 – 2012-07-02 (×4): 237 mL via ORAL

## 2012-06-30 MED ORDER — INSULIN GLARGINE 100 UNIT/ML ~~LOC~~ SOLN
20.0000 [IU] | Freq: Two times a day (BID) | SUBCUTANEOUS | Status: DC
  Administered 2012-06-30 – 2012-07-02 (×4): 20 [IU] via SUBCUTANEOUS

## 2012-06-30 NOTE — Progress Notes (Signed)
Name: Clayton [name] MRN: [id number] DOB: Aug 05, 1927 LOS: 67  PCCM  History of Present Illness: [name] is a 79 y.o. male with a PMHX of DM2, CKD, HTN who was admitted to [facility] on 06/19/2012 with acute respiratory failure secondary to H1N1 and requiring intubation on 06/22/2012.   Lines / Drains: ETT 01/03 >>  06/27/11  Cultures: 1/2 Influenza A>>>POSITIVE 1/2 H1N1>> POSITIVE  1/2 MRSA PCR>>>POSITIVE  1/3 UC>>>neg 1/3 BCx2>>>Neg 1/3 Resp Cx>> + MRSA tracheal aspirate  1/6 C.diff negative  1/8 HSV bronch> 1/8 BAL quant >>abundant wbc, +PMN, no sq cells, rare gm + cocci in pairs; ABUNDANT PMN's WITH NO ORGANISMS 1/8 Resp Cx bronch>>abundant wbc, +PMN, no sq cells, rare gm + cocci in pairs  1/8 bronch body fluid cell ct with diff: Red, turbid, 16750 wbc, 92 neutrophils, 8 monocyte-macrophages 1/8 cytology>>atypical cells 1/8 surgical pathology>>bronchus biopsy with squamous cell carcinoma with necrosis (per Dr [name] this was 1 cm piece of mass that was suctioned during bronch, site possibly Bronchus intermedius )  Antibiotics: Azithromycin 1/2 >>1/3  Levaquin 1/3 >>1/5  ceftriaxone 1/3>>>1/5  vanc iv 1/3>>>1/6  Vanc oral 1/6>>1/7  Tamiflu 1/2 >>1/11 Linezolid 1/6>>1/12 Doxycycline 1/12>>  Tests / Events: 1/3/ - Patient developed acute respiratory failure requiring ETT, borderline BP     SUBJECTIVE:  Not much cough.  Denies chest pain.  Vital Signs: Temp:  [97.4 F (36.3 C)-98.6 F (37 C)] 98.4 F (36.9 C) (01/13 0451) Pulse Rate:  [74-97] 74  (01/13 0451) Resp:  [18-20] 18  (01/13 0451) BP: (124-144)/(49-66) 144/66 mmHg (01/13 0451) SpO2:  [91 %-97 %] 94 % (01/13 0451) I/O last 3 completed shifts: In: 720 [P.O.:720] Out: 3520 [id number]; Stool:400]    Physical Examination: General:  Lying in bed, alert, nad Neuro:  Alert / oriented, MAE HEENT:  /at Cardiovascular:  RRR, no r/m/g Lungs:  course breath sounds b/l Abdomen:  Obese, ntnd, normal  bs Musculoskeletal:  Warm extremities, no cyanosis or edema  Skin:  Intact   Labs and Imaging:   Basic Metabolic Panel:  Lab [id number] 06/29/12 0505 06/27/12 0325  NA 139 139 --  K 4.2 3.9 --  CL 100 99 --  CO2 28 29 --  GLUCOSE 106* 139* --  BUN 42* 46* --  CREATININE 1.41* 1.33 --  CALCIUM 8.5 8.7 --  MG 2.1 -- 2.1  PHOS 3.7 -- 3.8   CBC:  Lab 06/30/12 0530 06/29/12 0505  WBC 16.9* 18.6*  NEUTROABS 12.9* 14.7*  HGB 10.9* 10.7*  HCT 32.4* 31.8*  MCV 97.3 96.7  PLT 334 325   BNP:  Lab 06/30/12 0530 06/29/12 0839 06/27/12 0325  PROBNP 665.5* 979.8* 2345.0*   CBG:  Lab 06/30/12 0803 06/30/12 0447 06/30/12 0016 06/29/12 2015 06/29/12 1701 06/29/12 1205  GLUCAP 113* 100* 76 214* 192* 236*    Dg Chest Port 1 View  06/29/2012  *RADIOLOGY REPORT*  Clinical Data: Influenza with pneumonia  PORTABLE CHEST - 1 VIEW  Comparison: 06/28/2012  Findings: Increased left base opacity compatible with worsening left lower lobe atelectasis / consolidation.  Persistent streaky right base atelectasis.  Small left effusion suspected.  No superimposed edema.  Negative for pneumothorax.  Trachea is midline.  IMPRESSION: Worsening left base atelectasis / consolidation compared to yesterday.   Original Report Authenticated By: Jerilynn Mages. Annamaria Boots, M.D.     Assessment and Plan:  Acute respiratory failure 2nd to Influenza a/H1N1 and MRSA pneumonia. Completed course of tamiflu. Plan: Complete Abx per  hospitalist team Adjust oxygen to keep SpO2 > 92% F/u HSV from BAL Transition to prn BD's as tolerated  Incidental finding of 1 cm mass from ?bronchus intermedius positive for NSCLC. Has prior hx of smoking. Plan: Will need outpt PET scan to further assess when recovered from acute illness   The patient has been scheduled for pulmonary office follow up with Dr. [name] for Wednesday,  July 23, 2012 at 2:30 PM.  PCCM will sign off.  Please call if additional help needed while pt in  hospital.   Chesley Mires, MD [place] 06/30/2012, 9:34 AM Pager:  [phone] After 3pm call: [phone]

## 2012-06-30 NOTE — Progress Notes (Signed)
[facility] PROGRESS NOTE  LERIN CHAPPLE [id number] DOB: 1927-12-28 DOA: 06/18/2012 PCP: No primary provider on file. The patient has been scheduled for pulmonary office follow up with Dr. [name] for Wednesday, July 23, 2012 at 2:30 PM.   Assessment/Plan: 1. Status post ventilatory-dependent respiratory failure: Secondary to H1N1, influenza A, possible superimposed pneumonia with left lower lobe consolidation. Tracheal aspirate positive for MRSA. Continue doxycycline per pulmonology. Patient completed treatment with Tamiflu. 2. Bronchial squamous cell carcinoma: Followup with Dr. [name] as an outpatient. 3. Diabetes mellitus: Somewhat labile. Decrease Lantus. 4. Suspected chronic kidney disease: Appears stable. Followup as an outpatient 5. Renal cysts: Consider outpatient renal MRI. 6. Diarrhea: Improved. C. difficile negative. Continue Imodium. Remove flexing seal. 7. Anemia critical illness: Stable.  Code Status: Full code Family Communication: None present Disposition Plan: CIR?  Murray Hodgkins, MD  [facility] Team 4  Pager [phone] If 7PM-7AM, please contact night-coverage at [email or web address], password [facility] 06/30/2012, 1:59 PM  LOS: 12 days   Brief narrative: 79 y.o. male with a PMHX of DM2, CKD, HTN who was admitted to [facility] on 06/19/2012 with acute respiratory failure secondary to H1N1 and requiring intubation on 06/22/2012. Now extubated since 1/9 tolerating.   HPI/Subjective: Afebrile, vital signs stable. On 4 L per minute nasal cannula. Feels okay.  Objective: Filed Vitals:   06/29/12 2238 06/30/12 0451 06/30/12 0932 06/30/12 1329  BP: 124/59 144/66  130/55  Pulse: 76 74  76  Temp: 98.6 F (37 C) 98.4 F (36.9 C)  98.4 F (36.9 C)  TempSrc: Oral Oral  Axillary  Resp: 20 18  20   Height:      Weight:      SpO2: 91% 94% 94% 94%    Intake/Output Summary (Last 24 hours) at 06/30/12 1359 Last data filed at 06/30/12 1300  Gross per 24 hour    Intake    480 ml  Output   2400 ml  Net  -1920 ml   Filed Weights   06/26/12 0500 06/27/12 0600 06/28/12 0439  Weight: 112.6 kg (248 lb 3.8 oz) 110.9 kg (244 lb 7.8 oz) 113.1 kg (249 lb 5.4 oz)    Exam:  General:  Appears calm and comfortable Cardiovascular: RRR, no m/r/g. No LE edema. Respiratory: CTA bilaterally, no w/r/r. Normal respiratory effort. Psychiatric: grossly normal mood and affect, speech fluent and appropriate  Data Reviewed: Basic Metabolic Panel:  Lab [id number] 0530 06/29/12 0505 06/28/12 0450 06/27/12 0325 06/26/12 0310 06/25/12 0420 06/24/12 0400  NA 139 139 138 144 138 -- --  K 4.2 3.9 3.8 3.1* 4.1 -- --  CL 100 99 99 101 102 -- --  CO2 28 29 28 29 27  -- --  GLUCOSE 106* 139* 264* 241* 310* -- --  BUN 42* 46* 54* 58* 52* -- --  CREATININE 1.41* 1.33 1.41* 1.41* 1.26 -- --  CALCIUM 8.5 8.7 8.7 9.8 9.5 -- --  MG 2.1 -- -- 2.1 2.2 2.3 2.7*  PHOS 3.7 -- -- 3.8 4.3 4.4 3.3   CBC:  Lab 06/30/12 0530 06/29/12 0505 06/28/12 0450 06/27/12 0325 06/26/12 0310  WBC 16.9* 18.6* 21.1* 24.0* 17.7*  NEUTROABS 12.9* 14.7* 18.4* 22.3* 15.9*  HGB 10.9* 10.7* 10.3* 11.4* 10.3*  HCT 32.4* 31.8* 31.2* 34.2* 31.5*  MCV 97.3 96.7 96.6 97.2 99.1  PLT 334 325 334 365 275     Basename 06/30/12 0530 06/29/12 0839 06/27/12 0325  PROBNP 665.5* 979.8* 2345.0*   CBG:  Lab 06/30/12 1230 06/30/12 0803 06/30/12 0447  06/30/12 0016 06/29/12 2015  GLUCAP 92 113* 100* 76 214*    Recent Results (from the past 240 hour(s))  CLOSTRIDIUM DIFFICILE BY PCR     Status: Normal   Collection Time   06/23/12 12:40 PM      Component Value Range Status Comment   C difficile by pcr NEGATIVE  NEGATIVE Final   CULTURE, BAL-QUANTITATIVE     Status: Normal   Collection Time   06/25/12  2:46 PM      Component Value Range Status Comment   Specimen Description BRONCHIAL ALVEOLAR LAVAGE   Final    Special Requests NONE   Final    Gram Stain     Final    Value: ABUNDANT WBC PRESENT, PREDOMINANTLY  PMN     NO SQUAMOUS EPITHELIAL CELLS SEEN     RARE GRAM POSITIVE COCCI     IN PAIRS   Colony Count 5,000 COLONIES/ML   Final    Culture     Final    Value: METHICILLIN RESISTANT STAPHYLOCOCCUS AUREUS     Note: RIFAMPIN AND GENTAMICIN SHOULD NOT BE USED AS SINGLE DRUGS FOR TREATMENT OF STAPH INFECTIONS. This organism is presumed to be Clindamycin resistant based on detection of inducible Clindamycin resistance. CRITICAL RESULT CALLED TO, READ BACK BY AND      VERIFIED WITH: MACSERO RN 11AM 06/29/12 GUSTK   Report Status 06/29/2012 FINAL   Final    Organism ID, Bacteria METHICILLIN RESISTANT STAPHYLOCOCCUS AUREUS   Final   CULTURE, RESPIRATORY     Status: Normal   Collection Time   06/25/12  2:46 PM      Component Value Range Status Comment   Specimen Description BRONCHIAL ALVEOLAR LAVAGE   Final    Special Requests NONE   Final    Gram Stain     Final    Value: ABUNDANT WBC PRESENT, PREDOMINANTLY PMN     NO SQUAMOUS EPITHELIAL CELLS SEEN     RARE GRAM POSITIVE COCCI     IN PAIRS   Culture     Final    Value: FEW STAPHYLOCOCCUS AUREUS     Note: SUSCEPTIBILITIES PERFORMED ON PREVIOUS CULTURE WITHIN THE LAST 5 DAYS.   Report Status 06/29/2012 FINAL   Final   HERPES SIMPLEX VIRUS CULTURE     Status: Normal (Preliminary result)   Collection Time   06/25/12  2:47 PM      Component Value Range Status Comment   Specimen Description BRONCHIAL ALVEOLAR LAVAGE   Final    Special Requests NONE   Final    Culture Culture has been initiated.   Final    Report Status PENDING   Incomplete      Studies: Dg Chest Port 1 View  06/29/2012  *RADIOLOGY REPORT*  Clinical Data: Influenza with pneumonia  PORTABLE CHEST - 1 VIEW  Comparison: 06/28/2012  Findings: Increased left base opacity compatible with worsening left lower lobe atelectasis / consolidation.  Persistent streaky right base atelectasis.  Small left effusion suspected.  No superimposed edema.  Negative for pneumothorax.  Trachea is midline.   IMPRESSION: Worsening left base atelectasis / consolidation compared to yesterday.   Original Report Authenticated By: Jerilynn Mages. Shick, M.D.     Scheduled Meds:   . albuterol  2.5 mg Nebulization BID  . diltiazem  90 mg Oral Q6H  . doxycycline  100 mg Oral Q12H  . famotidine  20 mg Oral Daily  . feeding supplement  237 mL Oral BID BM  .  finasteride  5 mg Oral Daily  . heparin subcutaneous  5,000 Units Subcutaneous Q8H  . insulin aspart  0-15 Units Subcutaneous TID WC  . insulin aspart  8 Units Subcutaneous TID WC  . insulin glargine  25 Units Subcutaneous BID  . LORazepam  1 mg Per Tube TID  . potassium chloride  40 mEq Oral Daily  . simvastatin  20 mg Oral q1800  . sodium chloride  3 mL Intravenous Q12H   Continuous Infusions:   Principal Problem:  *H1N1 influenza with pneumonia Active Problems:  HYPERTENSION  Hypoxia  Diabetes mellitus  Chronic kidney disease  Hard of hearing  Acute respiratory failure  Non-small cell lung cancer     Murray Hodgkins, MD  [facility] Team 4  Pager [phone] If 7PM-7AM, please contact night-coverage at [email or web address], password [facility] 06/30/2012, 1:59 PM  LOS: 12 days   Time spent: 20 minutes

## 2012-06-30 NOTE — Clinical Social Work Note (Signed)
Clinical Social Worker followed up with [name] [phone]) regarding choice of SNF. At the moment the family has not discussed alternative options. CSW informed daughter that a voice message was left with Dawn, admissions coordinator at [facility] regarding their decision, but has not heard back yet. CSW encouraged daughter to have alternative facility. Patient's insurance requires prior authorization before transfer to SNF at discharge. CSW will continue to follow.   Leandro Reasoner MSW, [place]

## 2012-06-30 NOTE — Progress Notes (Signed)
NUTRITION FOLLOW UP  Intervention:   Glucerna Shake po BID, each supplement provides 220 kcal and 10 grams of protein. RD to continue to follow nutrition care plan  Nutrition Dx:   Inadequate oral intake related to inability to eat as evidenced by NPO status, resolved.  New Nutrition Dx: Increased nutrient needs r/t catabolic illness AEB estimated needs.  Goal:   Enteral nutrition to provide 60-70% of estimated calorie needs (22-25 kcals/kg ideal body weight) and >/= 90% of estimated protein needs, based on ASPEN guidelines for permissive underfeeding in critically ill obese individuals, discontinue.  New Goal: Pt to meet >/= 90% of their estimated nutrition needs.  Monitor:   TF tolerance/adequacy, weight trend, labs, I/O  Assessment:   Extubated 1/9. TF discontinued with extubation. BSE completed s/p extubation, with recommendations for regular diet with thin liquids. Currently on Carbohydrate Modified Medium diet. Intake of meals is adequate, pt is consume on average 75% of meals. Family at bedside report that pt is tolerating meals and pt has not had any diarrhea yet today. Agreeable to supplements to help with nutritional status.  Noted new dx of squamous cell lung carcinoma with necrosis.  Height: Ht Readings from Last 1 Encounters:  06/20/12 6' (1.829 m)    Weight Status:   Wt Readings from Last 1 Encounters:  06/28/12 249 lb 5.4 oz (113.1 kg)  1/9 248 lb 1/6 236 lb 1/3 244 lb  Weight is stable. Body mass index is 33.82 kg/(m^2). Obese Class I.  Re-estimated needs:  Kcal: 1900 - 2050 Protein: 90 - 105 gm Fluid: 1.9 - 2.1 L  Skin: no problems noted  Diet Order: Carb Control Medium, 1600 - 2000 kcal   Intake/Output Summary (Last 24 hours) at 06/30/12 1140 Last data filed at 06/30/12 0452  Gross per 24 hour  Intake      0 ml  Output   2200 ml  Net  -2200 ml    Last BM: 1/10 (diarrhea)  Labs:   Lab 06/30/12 0530 06/29/12 0505 06/28/12 0450 06/27/12  0325 06/26/12 0310  NA 139 139 138 -- --  K 4.2 3.9 3.8 -- --  CL 100 99 99 -- --  CO2 28 29 28  -- --  BUN 42* 46* 54* -- --  CREATININE 1.41* 1.33 1.41* -- --  CALCIUM 8.5 8.7 8.7 -- --  MG 2.1 -- -- 2.1 2.2  PHOS 3.7 -- -- 3.8 4.3  GLUCOSE 106* 139* 264* -- --    CBG (last 3)   Basename 06/30/12 0803 06/30/12 0447 06/30/12 0016  GLUCAP 113* 100* 76    Scheduled Meds:    . albuterol  2.5 mg Nebulization BID  . diltiazem  90 mg Oral Q6H  . doxycycline  100 mg Oral Q12H  . famotidine  20 mg Oral Daily  . finasteride  5 mg Oral Daily  . heparin subcutaneous  5,000 Units Subcutaneous Q8H  . insulin aspart  0-15 Units Subcutaneous Q4H  . insulin aspart  8 Units Subcutaneous TID WC  . insulin glargine  25 Units Subcutaneous BID  . LORazepam  1 mg Per Tube TID  . potassium chloride  40 mEq Oral Daily  . simvastatin  20 mg Oral q1800  . sodium chloride  3 mL Intravenous Q12H    Continuous Infusions:    Inda Coke MS, RD, LDN Pager: [phone] After-hours pager: [phone]

## 2012-06-30 NOTE — Progress Notes (Signed)
Inpatient Diabetes Program Recommendations  AACE/ADA: New Consensus Statement on Inpatient Glycemic Control (2013)  Target Ranges:  Prepandial:   less than 140 mg/dL      Peak postprandial:   less than 180 mg/dL (1-2 hours)      Critically ill patients:  140 - 180 mg/dL   Reason for Visit: Had been hyperglycemic.  Decadron stopped 06/28/2012  Inpatient Diabetes Program Recommendations Insulin - Basal: Currently receiving Lantus 25 units BID.  Please consider changing to Lantus 20 units BID. Correction (SSI): Currently on moderate correction scale tid.  Please consider changing to sensitive. Insulin - Meal Coverage: Currently receiving Novolog 8 units tid with meals. Diet: Currently on CHO modified diet.  To get Glucerna shake BID.  Note: Results for Clayton, [name] (MRN [id number]) as of 06/30/2012 14:47  Ref. Range 06/29/2012 07:43 06/29/2012 12:05 06/29/2012 17:01 06/29/2012 20:15 06/30/2012 00:16 06/30/2012 04:47 06/30/2012 08:03 06/30/2012 12:30  Glucose-Capillary Latest Range: 70-99 mg/dL 121 (H) 236 (H) 192 (H) 214 (H) 76 100 (H) 113 (H) 92   Patient experiencing CBG's below target since effects of Decadron have worn off- stopped 06-28-2012.  See above recommendations regarding reduction of Lantus and correction.  Thank you. Sunshine Mackowski S. Marcelline Mates, RN, CNS, CDE Inpatient Diabetes Program, team pager [phone]

## 2012-06-30 NOTE — Progress Notes (Addendum)
Physical Therapy Treatment Patient Details Name: Clayton [name] MRN: [id number] DOB: [birth year] Today's Date: 06/30/2012 Time: [id number] PT Time Calculation (min): 21 min  PT Assessment / Plan / Recommendation Comments on Treatment Session  Pt with H1N1, previously extubated, communicating well, and cognitively intact. Continues to make steady progress towards PT goals at this time. Able to increase ambulation distance to 42ft wi saturation levels maintained >90 on 4 liters (see vitals for further details). Pt educated on pursed lip breathing technique for increased O2. Will continue to see acutely and progress activity as tolerated.    Follow Up Recommendations  CIR           Equipment Recommendations  Rolling walker with 5" wheels    Recommendations for Other Services Rehab consult  Frequency Min 3X/week   Plan Discharge plan remains appropriate;Frequency remains appropriate    Precautions / Restrictions Precautions Precautions: Fall Restrictions Weight Bearing Restrictions: No   Pertinent Vitals/Pain No pain reported; SpO2 monitored throughout treatment session Prior to activity 94% on 4liters air; desaturation to 90% on rm air while transitioning to portable O2 seated EOB, with ambulation 93% on 4L, 95% post activity on 4L.      Mobility  Bed Mobility Bed Mobility: Supine to Sit;Sitting - Scoot to Edge of Bed Supine to Sit: 4: Min assist;HOB elevated;With rails Sitting - Scoot to Edge of Bed: 4: Min guard Details for Bed Mobility Assistance: Pt only requires minimal assist for manual hand placement during pull to sit Transfers Transfers: Sit to Stand;Stand to Sit Sit to Stand: 1: +2 Total assist;From bed Sit to Stand: Patient Percentage: 90% Stand to Sit: 4: Min guard;To chair/3-in-1;With armrests Details for Transfer Assistance: VC's for hand placement; assist to initiate stand Ambulation/Gait Ambulation/Gait Assistance: 4: Min guard;4: Min Wellsite geologist  (Feet): 40 Feet Assistive device: 2 person hand held assist Ambulation/Gait Assistance Details: VC's for wider base of support, Min assist through hand held Gait Pattern: Step-to pattern;Trunk flexed;Decreased stride length;Decreased hip/knee flexion - right;Decreased hip/knee flexion - left;Decreased step length - right;Decreased step length - left (emerging step through) Gait velocity: decr Stairs: No Wheelchair Mobility Wheelchair Mobility: No      PT Goals Acute Rehab PT Goals Pt will Sit at Edge of Bed: Independently PT Goal: Sit at Edge Of Bed - Progress: Met Pt will go Sit to Stand: Independently PT Goal: Sit to Stand - Progress: Progressing toward goal Pt will go Stand to Sit: Independently PT Goal: Stand to Sit - Progress: Progressing toward goal Pt will Stand: with modified independence;3 - 5 min;with unilateral upper extremity support PT Goal: Stand - Progress: Progressing toward goal Pt will Ambulate: >150 feet;with modified independence;with least restrictive assistive device PT Goal: Ambulate - Progress: Progressing toward goal  Visit Information  Last PT Received On: 06/30/12 Assistance Needed: +2 (for safety and equipment management (O2) and dinamap)    Subjective Data  Subjective: "I got up last night" Patient Stated Goal: to feel better and go home   Cognition  Overall Cognitive Status: Appears within functional limits for tasks assessed/performed Arousal/Alertness: Awake/alert Orientation Level: Appears intact for tasks assessed Behavior During Session: [facility] for tasks performed Safety/Judgement - Other Comments: Pt not fully aware of deficits, req cues to attend to sequencing and safety during session    Balance  Balance Balance Assessed: Yes Static Sitting Balance Static Sitting - Balance Support: Feet supported;Bilateral upper extremity supported Static Sitting - Level of Assistance: 7: Independent Static Sitting - Comment/# of Minutes: 2  mins  EOB Static Standing Balance Static Standing - Balance Support: Bilateral upper extremity supported;During functional activity Static Standing - Level of Assistance: 5: Stand by assistance Static Standing - Comment/# of Minutes: 1 min; 2 min to check SpO2 and rest  End of Session PT - End of Session Equipment Utilized During Treatment: Oxygen (4Liters) Activity Tolerance: Patient limited by fatigue;Other (comment) (limited due to O2 sats) Patient left: in chair;with call bell/phone within reach;with family/visitor present Nurse Communication: Mobility status (SpO2 saturation levels with activity)   GP     Duncan Dull 06/30/2012, 4:05 PM  Alben Deeds, [place] DPT  [phone]

## 2012-06-30 NOTE — Progress Notes (Signed)
Rehab Admissions Coordinator Note:  Patient was screened by Cleatrice Burke for appropriateness for an Inpatient Acute Rehab Consult.  At this time, we are recommending [facility]. As per I note on 1/6, [employer] will not approve inpt rehab admission for this diagnosis. Please call me for any questions.   Cleatrice Burke 06/30/2012, 4:16 PM  I can be reached at [phone].

## 2012-07-01 DIAGNOSIS — C3490 Malignant neoplasm of unspecified part of unspecified bronchus or lung: Secondary | ICD-10-CM

## 2012-07-01 LAB — BASIC METABOLIC PANEL
BUN: 37 mg/dL — ABNORMAL HIGH (ref 6–23)
CO2: 28 mEq/L (ref 19–32)
Glucose, Bld: 111 mg/dL — ABNORMAL HIGH (ref 70–99)
Potassium: 4.3 mEq/L (ref 3.5–5.1)
Sodium: 139 mEq/L (ref 135–145)

## 2012-07-01 LAB — GLUCOSE, CAPILLARY: Glucose-Capillary: 93 mg/dL (ref 70–99)

## 2012-07-01 MED ORDER — TIOTROPIUM BROMIDE MONOHYDRATE 18 MCG IN CAPS: 18.0000 ug | ORAL_CAPSULE | Freq: Every day | RESPIRATORY_TRACT | Status: DC

## 2012-07-01 MED ORDER — INSULIN GLARGINE 100 UNIT/ML ~~LOC~~ SOLN: 20.0000 [IU] | Freq: Two times a day (BID) | SUBCUTANEOUS | Status: DC

## 2012-07-01 MED ORDER — INSULIN ASPART 100 UNIT/ML ~~LOC~~ SOLN: 8.0000 [IU] | Freq: Three times a day (TID) | SUBCUTANEOUS | Status: DC

## 2012-07-01 MED ORDER — ALBUTEROL SULFATE HFA 108 (90 BASE) MCG/ACT IN AERS: 2.0000 | INHALATION_SPRAY | RESPIRATORY_TRACT | Status: DC | PRN

## 2012-07-01 MED ORDER — GLUCERNA SHAKE PO LIQD: 237.0000 mL | Freq: Two times a day (BID) | ORAL | Status: DC

## 2012-07-01 MED ORDER — ALBUTEROL SULFATE (5 MG/ML) 0.5% IN NEBU: 2.5000 mg | INHALATION_SOLUTION | Freq: Three times a day (TID) | RESPIRATORY_TRACT | Status: DC

## 2012-07-01 MED ORDER — TIOTROPIUM BROMIDE MONOHYDRATE 18 MCG IN CAPS
18.0000 ug | ORAL_CAPSULE | Freq: Every day | RESPIRATORY_TRACT | Status: DC
  Administered 2012-07-01: 18 ug via RESPIRATORY_TRACT
  Filled 2012-07-01: qty 5

## 2012-07-01 NOTE — Discharge Summary (Signed)
Physician Discharge Summary  Clayton [name] [id number] DOB: Jan 01, 1928 DOA: 06/18/2012  PCP: No primary provider on file.  Admit date: 06/18/2012 Discharge date: 07/01/2012  Time spent: 40 minutes  Recommendations for Outpatient Follow-up:  1. Follow up with SNF MD.  Discharge Diagnoses:  Principal Problem:  *H1N1 influenza with pneumonia Active Problems:  HYPERTENSION  Hypoxia  Diabetes mellitus  Chronic kidney disease  Hard of hearing  Acute respiratory failure  Non-small cell lung cancer   Discharge Condition: Satisfactory.   Diet recommendation: Heart-Healthy/Carbohydrate-Modified.   Filed Weights   06/27/12 0600 06/28/12 0439 07/01/12 0500  Weight: 110.9 kg (244 lb 7.8 oz) 113.1 kg (249 lb 5.4 oz) 111 kg (244 lb 11.4 oz)    History of present illness:  79 y.o. male with a PMHX of DM2, CKD, HTN who was admitted to [facility] on 06/19/2012 with acute respiratory failure secondary to H1N1 and requiring intubation on 06/22/2012. Extubated on 06/26/12.    Hospital Course:  1. Ventilator-dependent respiratory failure: Patient presented in acute respiratory distress with hypoxia, and required intubation/Ventilation. Flu PCR was positive for Influenza A/H1N1 and imaging studies demonstrated possible superimposed pneumonia with left lower lobe consolidation. Tracheal aspirate was positive for MRSA. He was managed with a full course of Tamiflu in the ICU setting, and treated with Vancomycin, then Zyvox, and subsequently Doxycycline, in combination with supportive measures. Clinical response was satisfactory, and he was extubated on 06/26/12. Oxygen requirements have gradually improved and patient is saturating at 92%-98% on 4L at rest, although he desaturates to 87% on ambulation. On bronchodilators and Spiriva. Antibiotics were discontinued on 07/01/12.  2. Bronchial squamous cell carcinoma: Patient is s/p bronchoscopy on 06/21/12, by Dr Clayton [name], which confirmed purulent  tracheobronchitis. Surgical pathology of bronchus biopsy revealed squamous cell carcinoma with necrosis (per Dr Clayton [name] this was 1 cm piece of mass that was suctioned during bronchoscopy, site possibly Bronchus intermedius ). Followup with Dr. Chase [name], pulmonologist, as an outpatient has been scheduled for Wednesday, July 23, 2012 at 2:30 PM.  3. Diabetes mellitus: Somewhat labile, but reasonably controlled on diet, scheduled Lantus and SSI.  4. Suspected chronic kidney disease: Creatinine ranged at about 1.26-1.43 during this hospitalization, and has remained stable. Unfortunately, no baseline creatinine in EMR. Followup as an outpatient.  5. Renal cysts: Patient underwent renal ultrasound on 06/20/12, which showed bilateral hypoechoic renal cortical lesions which are incompletely characterized sonographically but could represent cysts.  Definitive characterization is recommended with renal mass protocol MRI with contrast performed on a non emergent outpatient basis. Alternatively, if the patient cannot undergo MRI, consider follow-up renal ultrasound in 6-12 months. This will be deferred to Dr Clayton [name].  6. Diarrhea: Patient experienced diarrhea during hospitalization, which is probably antibiotic-associated. Fortunately, C. difficile PCR was negative. Managed satisfactorily with Imodium. Flexiseal was discontinued on 06/30/12, without deleterious effect.  67 Anemia: This is normocytic, due to chronic disease and critical illness. HB is stable/reasonable.  7. Deconditioning: Patient is profoundly deconditioned, due to recent acute illness. Evaluated by PT/OT and rehab, recommended.    Procedures:  See Below.   Bronchoscopy 06/25/12.    Consultations:  N/A.   Discharge Exam: Filed Vitals:   06/30/12 2134 06/30/12 2137 07/01/12 0500 07/01/12 0935  BP:  133/52 136/53   Pulse:  79 78   Temp:  98.2 F (36.8 C) 98.1 F (36.7 C)   TempSrc:  Axillary Oral   Resp:  20 18   Height:        Weight:  111 kg (244 lb 11.4 oz)   SpO2: 95% 98% 92% 92%    General: Comfortable, alert, communicative, fully oriented, not short of breath at rest, but SOBOE.  HEENT: Mild clinical pallor, no jaundice, no conjunctival injection or discharge.  NECK: Supple, JVP not seen, no carotid bruits, no palpable lymphadenopathy, no palpable goiter.  CHEST: Clinically clear to auscultation, no wheezes, no crackles.  HEART: Sounds 1 and 2 heard, normal, regular, no murmurs.  ABDOMEN: Full, soft, non-tender, no palpable organomegaly, no palpable masses, normal bowel sounds.  GENITALIA: Not examined.  LOWER EXTREMITIES: No pitting edema, palpable peripheral pulses.  MUSCULOSKELETAL SYSTEM: Generalized osteoarthritic changes, otherwise, normal.  CENTRAL NERVOUS SYSTEM: No focal neurologic deficit on gross examination.  Discharge Instructions      Discharge Orders    Future Appointments: Provider: Department: Dept Phone: Center:   07/23/2012 2:30 PM Clayton Males, MD [facility] [phone] None     Future Orders Please Complete By Expires   For home use only DME Walker      Comments:   5" rolling walker   Diet - low sodium heart healthy      Diet Carb Modified      Increase activity slowly          Medication List     As of 07/01/2012  9:43 AM    STOP taking these medications         acetaminophen 325 MG tablet   Commonly known as: TYLENOL      ferumoxytol 510 MG/17ML Soln   Commonly known as: FERAHEME      furosemide 20 MG tablet   Commonly known as: LASIX      glimepiride 2 MG tablet   Commonly known as: AMARYL      indapamide 1.25 MG tablet   Commonly known as: LOZOL      insulin lispro 100 UNIT/ML injection   Commonly known as: HUMALOG      metFORMIN 500 MG tablet   Commonly known as: GLUCOPHAGE      pioglitazone 45 MG tablet   Commonly known as: ACTOS      TAKE these medications         albuterol 108 (90 BASE) MCG/ACT inhaler   Commonly known as:  PROVENTIL HFA;VENTOLIN HFA   Inhale 2 puffs into the lungs every 4 (four) hours as needed for wheezing or shortness of breath.      albuterol (5 MG/ML) 0.5% nebulizer solution   Commonly known as: PROVENTIL   Take 0.5 mLs (2.5 mg total) by nebulization 3 (three) times daily.      alendronate 70 MG tablet   Commonly known as: FOSAMAX   Take 70 mg by mouth every 7 (seven) days. Take with a full glass of water on an empty stomach.      BIFERA 28 MG Tabs   Take 1 tablet by mouth daily.      diltiazem 360 MG 24 hr capsule   Commonly known as: TIAZAC   Take 360 mg by mouth daily.      feeding supplement Liqd   Take 237 mLs by mouth 2 (two) times daily between meals.      finasteride 5 MG tablet   Commonly known as: PROSCAR   Take 5 mg by mouth daily.      insulin aspart 100 UNIT/ML injection   Commonly known as: novoLOG   Inject 8 Units into the skin 3 (three) times daily with meals.  insulin glargine 100 UNIT/ML injection   Commonly known as: LANTUS   Inject 20 Units into the skin 2 (two) times daily.      MYRBETRIQ 25 MG Tb24   Generic drug: mirabegron ER   Take 25 mg by mouth at bedtime.      simvastatin 20 MG tablet   Commonly known as: ZOCOR   Take 20 mg by mouth every evening.      tiotropium 18 MCG inhalation capsule   Commonly known as: SPIRIVA   Place 1 capsule (18 mcg total) into inhaler and inhale daily.      TOVIAZ 8 MG Tb24   Generic drug: fesoterodine   Take 8 mg by mouth daily.      TYLENOL ARTHRITIS PAIN PO   Take 2 tablets by mouth 2 (two) times daily.      zolpidem 5 MG tablet   Commonly known as: AMBIEN   Take 5 mg by mouth at bedtime as needed. For insomnia        Follow-up Information    Call [facility], [place]. (Wednesday, July 23, 2012 at 2:30 PM)    Contact information:   [address] [place] [address] [phone]       Please follow up. (Follow up with SNF MD. )           The results of significant diagnostics  from this hospitalization (including imaging, microbiology, ancillary and laboratory) are listed below for reference.    Significant Diagnostic Studies: Dg Chest 2 View  06/18/2012  *RADIOLOGY REPORT*  Clinical Data: Cough, congestion, shortness of breath, fever.  CHEST - 2 VIEW  Comparison: 03/12/2012  Findings: Lingular / left basilar density again noted, likely scarring.  No confluent opacity on the right.  Heart is upper limits normal in size.  No effusions.  No acute bony abnormality. Degenerative changes in the thoracic spine.  IMPRESSION: Stable lingular / left basilar density, likely scarring.  No acute findings.   Original Report Authenticated By: Rolm Baptise, M.D.    Ct Angio Chest Pe W/cm &/or Wo Cm  06/19/2012  *RADIOLOGY REPORT*  Clinical Data: Shortness of breath and cough; congestion.  Fever. Elevated D-dimer.  CT ANGIOGRAPHY CHEST  Technique:  Multidetector CT imaging of the chest using the standard protocol during bolus administration of intravenous contrast. Multiplanar reconstructed images including MIPs were obtained and reviewed to evaluate the vascular anatomy.  Contrast: 68mL OMNIPAQUE IOHEXOL 350 MG/ML SOLN  Comparison: Chest radiograph performed 06/18/2012  Findings: There is no evidence of central pulmonary embolus; evaluation for pulmonary embolus is suboptimal due to motion artifact.  Minimal bibasilar atelectasis is noted.  A small lymph node is seen along the right minor fissure.  There is no evidence of significant focal consolidation, pleural effusion or pneumothorax.  A likely 4 mm nodule is noted within the left lower lobe (image 59 of 94).  The mediastinum is unremarkable in appearance.  No mediastinal lymphadenopathy is seen.  No pericardial effusion is identified. The great vessels are grossly unremarkable in appearance.  Diffuse coronary artery calcifications are seen.  Soft tissue density at the posterior mediastinum is nonspecific and may reflect motion artifact; there is  also unusual soft tissue density tracking about the bronchioles bilaterally.  No axillary lymphadenopathy is seen.  The visualized portions of the thyroid gland are unremarkable in appearance.  Nonspecific 2.2 cm soft tissue density is noted adjacent to the distal esophagus; the esophagus contains a small amount of fluid, likely transient in  nature.  A calcified granuloma is noted within the left hepatic lobe.  The visualized portions of the liver and spleen are otherwise unremarkable.  No acute osseous abnormalities are seen.  IMPRESSION:  1.  No evidence of central pulmonary embolus. 2.  Minimal bibasilar atelectasis noted; lungs otherwise clear. 3.  Likely 4 mm nodule noted within the left lower lobe.  If the patient is at high risk for bronchogenic carcinoma, follow-up chest CT at 1 year is recommended.  If the patient is at low risk, no follow-up is needed.  This recommendation follows the consensus statement: Guidelines for Management of Small Pulmonary Nodules Detected on CT Scans:  A Statement from the [employer] as published in Radiology 2005; 237:395-400. 4.  Diffuse coronary artery calcifications seen. 5.  Soft tissue density at the posterior mediastinum is nonspecific and may reflect motion artifact; unusual significant soft tissue density noted tracking about the bronchioles bilaterally, thought to reflect chronic lung disease.  No definite mediastinal lymphadenopathy seen. 6.  Nonspecific soft tissue density noted adjacent to the distal esophagus, possibly reflecting scarring.   Original Report Authenticated By: Santa Lighter, M.D.    US Renal  06/20/2012  *RADIOLOGY REPORT*  Clinical Data: Acute renal failure.  Reported history of renal cysts.  RENAL/URINARY TRACT ULTRASOUND COMPLETE  Comparison:  Report of remote renal ultrasound 12/24/2000.  Findings:  Right Kidney:  12.5 cm.  No hydronephrosis or solid mass.  Right lower renal pole exophytic hypoechoic oval lesion measures 2.0 x 1.9 x 1.6  cm.  Left Kidney:  12.1 cm.  Left lower renal pole cortical hypoechoic oval lesion measures 3.6 x 2.7 x 3.0 cm.  Upper pole exophytic hypoechoic oval lesion measures 2.8 x 2.6 x 2.3 cm.  Both kidneys demonstrate increased renal cortical echogenicity. Fine detail is obscured by patient body habitus.  Bladder:  Bladder is decompressed with a Foley catheter in place.  IMPRESSION: Suboptimal visualization due to body habitus.  Increased renal cortical echogenicity is most compatible with medical renal disease.  No hydronephrosis or acute abnormality.  Bilateral hypoechoic renal cortical lesions which are incompletely characterized sonographically but could represent cysts. Definitive characterization could be performed with renal mass protocol MRI with contrast performed on a non emergent outpatient basis.  Alternatively, if the patient cannot undergo MRI, consider follow-up renal ultrasound in 6-12 months.   Original Report Authenticated By: Conchita Paris, M.D.    Dg Chest Port 1 View  06/29/2012  *RADIOLOGY REPORT*  Clinical Data: Influenza with pneumonia  PORTABLE CHEST - 1 VIEW  Comparison: 06/28/2012  Findings: Increased left base opacity compatible with worsening left lower lobe atelectasis / consolidation.  Persistent streaky right base atelectasis.  Small left effusion suspected.  No superimposed edema.  Negative for pneumothorax.  Trachea is midline.  IMPRESSION: Worsening left base atelectasis / consolidation compared to yesterday.   Original Report Authenticated By: Jerilynn Mages. Annamaria Boots, M.D.    Dg Chest Port 1 View  06/28/2012  *RADIOLOGY REPORT*  Clinical Data: Hypertension, diabetes, extubation  PORTABLE CHEST - 1 VIEW  Comparison: 06/26/2012  Findings: Interval extubation and NG tube removal.  Low lung volumes persist with streaky basilar opacities compatible with atelectasis.  Stable cardiomegaly but negative for CHF.  Very small effusions suspected.  No pneumothorax.  Trachea is midline. Degenerative changes  of the spine evident.  IMPRESSION: Low volume exam with basilar atelectasis.   Original Report Authenticated By: Jerilynn Mages. Annamaria Boots, M.D.    Dg Chest Port 1 View  06/26/2012  *RADIOLOGY REPORT*  Clinical Data: Respiratory failure, influenza and pneumonia.  PORTABLE CHEST - 1 VIEW  Comparison: 06/25/2012  Findings: Endotracheal tube tip approximately 4.5 cm above the carina.  Nasogastric tube extends into the stomach.  Lungs show stable volumes and relatively stable patchy bilateral airspace infiltrates and consolidation of the left lower lobe.  The heart size is stable.  IMPRESSION: Stable bilateral airspace infiltrates and left lower lobe consolidation.   Original Report Authenticated By: Aletta Edouard, M.D.    Dg Chest Port 1 View  06/25/2012  *RADIOLOGY REPORT*  Clinical Data: Post bronchoscopy.  PORTABLE CHEST - 1 VIEW  Comparison: 06/25/2012  Findings: Endotracheal tube and NG tube are in stable position. Increasing patchy bilateral airspace disease following bronchoscopy.  No visible pneumothorax.  No effusions.  Heart is borderline in size.  IMPRESSION: No visible pneumothorax following bronchoscopy.  Increasing patchy bilateral airspace disease.  Mild cardiomegaly.   Original Report Authenticated By: Rolm Baptise, M.D.    Dg Chest Port 1 View  06/25/2012  *RADIOLOGY REPORT*  Clinical Data: Intubation, shortness of breath.  PORTABLE CHEST - 1 VIEW  Comparison: 06/23/2012 and CT chest 06/19/2012.  Findings: Endotracheal tube is in satisfactory position. Nasogastric tube is followed into the stomach.  Heart size stable. There is bibasilar dependent air space disease.  No definite pleural fluid.  IMPRESSION: Bibasilar dependent air space disease, possibly due to edema or pneumonia.   Original Report Authenticated By: Lorin Picket, M.D.    Dg Chest Port 1 View  06/23/2012  *RADIOLOGY REPORT*  Clinical Data: Vascular congestion.  Follow-up.  PORTABLE CHEST - 1 VIEW  Comparison: 06/22/2012  Findings: Support  devices are unchanged.  Heart is upper limits normal in size.  Mild peribronchial thickening.  Patchy bilateral opacities again noted, slightly improved.  No visible effusion.  No acute bony abnormality.  IMPRESSION: Slight improvement in patchy bilateral airspace disease.   Original Report Authenticated By: Rolm Baptise, M.D.    Dg Chest Port 1 View  06/22/2012  *RADIOLOGY REPORT*  Clinical Data: Intubation, assess endotracheal tube, history hypertension, diabetes, chronic renal insufficiency  PORTABLE CHEST - 1 VIEW  Comparison: Portable exam 0535 hours compared to 06/21/2012  Findings: Tip of endotracheal tube 4.3 cm above carina. Nasogastric tube extends into stomach. Upper normal-sized cardiac silhouette. Slight pulmonary vascular congestion. Patchy right lung and left basilar infiltrates, slightly improved. Mild bronchitic changes. No pleural effusion or pneumothorax. Bones demineralized.  IMPRESSION: Patchy bilateral pulmonary infiltrates, slightly improved.   Original Report Authenticated By: Lavonia Dana, M.D.    Dg Chest Port 1 View  06/21/2012  *RADIOLOGY REPORT*  Clinical Data: Acute respiratory failure on ventilator.  Influenza pneumonia.  PORTABLE CHEST - 1 VIEW  Comparison: 06/20/2012  Findings: Bilateral lower lung airspace disease shows little or no significant change.  There is increased patchy airspace disease seen in the inferior right upper lobe.  Heart size is normal.  Endotracheal tube and nasogastric are seen in appropriate position.  IMPRESSION:  1.  Increase in patchy airspace disease in the inferior right upper lobe. 2.  Little or no significant change in bibasilar airspace disease.   Original Report Authenticated By: Earle Gell, M.D.    Dg Chest Port 1 View  06/20/2012  *RADIOLOGY REPORT*  Clinical Data: Endotracheal tube placement.  PORTABLE CHEST - 1 VIEW  Comparison: Chest radiograph performed earlier today at 03:22 a.m.  Findings: The patient's endotracheal tube is seen ending 3-4  cm above the carina.  Bibasilar and right midlung airspace opacification is  again noted, perhaps slightly worsened on the right side, compatible with multifocal pneumonia.  No definite pleural effusion or pneumothorax is seen, though the left costophrenic angle is incompletely imaged on this study.  The cardiomediastinal silhouette is borderline normal in size.  No acute osseous abnormalities are identified.  IMPRESSION:  1.  Endotracheal tube seen ending 3-4 cm above the carina. 2.  Persistent multifocal pneumonia, perhaps slightly worsened on the right side.   Original Report Authenticated By: Santa Lighter, M.D.    Dg Chest Port 1 View  06/20/2012  *RADIOLOGY REPORT*  Clinical Data: Shortness of breath; respiratory distress.  PORTABLE CHEST - 1 VIEW  Comparison: Chest radiograph performed 06/18/2012, and CTA of the chest performed 06/19/2012  Findings: The lungs are well-aerated.  Patchy right mid and lower lung zone airspace opacification is concerning for pneumonia.  More mild left basilar airspace opacity is also seen.  There is no evidence of pleural effusion or pneumothorax.  The cardiomediastinal silhouette is borderline normal in size.  No acute osseous abnormalities are seen.  IMPRESSION: Patchy right mid and lower lung zone airspace opacification, concerning for pneumonia; more mild left basilar airspace opacity also seen.   Original Report Authenticated By: Santa Lighter, M.D.    Dg Abd Portable 1v  06/21/2012  *RADIOLOGY REPORT*  Clinical Data: Abdominal distention.  PORTABLE ABDOMEN - 1 VIEW 06/21/2012 1558 hours:  Comparison: None.  Findings: Bowel gas pattern unremarkable without evidence of obstruction or significant ileus.  No suggestion of free air on the supine image.  Nasogastric tip in the body of the stomach.  Foley catheter.  Degenerative changes involving the lower lumbar spine. Phleboliths in the pelvis.  No visible opaque urinary tract calculi.  IMPRESSION: No acute abdominal  abnormality.   Original Report Authenticated By: Evangeline Dakin, M.D.     Microbiology: Recent Results (from the past 240 hour(s))  CLOSTRIDIUM DIFFICILE BY PCR     Status: Normal   Collection Time   06/23/12 12:40 PM      Component Value Range Status Comment   C difficile by pcr NEGATIVE  NEGATIVE Final   CULTURE, BAL-QUANTITATIVE     Status: Normal   Collection Time   06/25/12  2:46 PM      Component Value Range Status Comment   Specimen Description BRONCHIAL ALVEOLAR LAVAGE   Final    Special Requests NONE   Final    Gram Stain     Final    Value: ABUNDANT WBC PRESENT, PREDOMINANTLY PMN     NO SQUAMOUS EPITHELIAL CELLS SEEN     RARE GRAM POSITIVE COCCI     IN PAIRS   Colony Count 5,000 COLONIES/ML   Final    Culture     Final    Value: METHICILLIN RESISTANT STAPHYLOCOCCUS AUREUS     Note: RIFAMPIN AND GENTAMICIN SHOULD NOT BE USED AS SINGLE DRUGS FOR TREATMENT OF STAPH INFECTIONS. This organism is presumed to be Clindamycin resistant based on detection of inducible Clindamycin resistance. CRITICAL RESULT CALLED TO, READ BACK BY AND      VERIFIED WITH: MACSERO RN 11AM 06/29/12 GUSTK   Report Status 06/29/2012 FINAL   Final    Organism ID, Bacteria METHICILLIN RESISTANT STAPHYLOCOCCUS AUREUS   Final   CULTURE, RESPIRATORY     Status: Normal   Collection Time   06/25/12  2:46 PM      Component Value Range Status Comment   Specimen Description BRONCHIAL ALVEOLAR LAVAGE   Final    Special  Requests NONE   Final    Gram Stain     Final    Value: ABUNDANT WBC PRESENT, PREDOMINANTLY PMN     NO SQUAMOUS EPITHELIAL CELLS SEEN     RARE GRAM POSITIVE COCCI     IN PAIRS   Culture     Final    Value: FEW STAPHYLOCOCCUS AUREUS     Note: SUSCEPTIBILITIES PERFORMED ON PREVIOUS CULTURE WITHIN THE LAST 5 DAYS.   Report Status 06/29/2012 FINAL   Final   HERPES SIMPLEX VIRUS CULTURE     Status: Normal   Collection Time   06/25/12  2:47 PM      Component Value Range Status Comment   Specimen  Description BRONCHIAL ALVEOLAR LAVAGE   Final    Special Requests NONE   Final    Culture No Herpes Simplex Detected in Cell Culture   Final    Report Status 06/30/2012 FINAL   Final      Labs: Basic Metabolic Panel:  Lab [id number] 06/30/12 0530 06/29/12 0505 06/28/12 0450 06/27/12 0325 06/26/12 0310 06/25/12 0420  NA 139 139 139 138 144 -- --  K 4.3 4.2 3.9 3.8 3.1* -- --  CL 101 100 99 99 101 -- --  CO2 28 28 29 28 29  -- --  GLUCOSE 111* 106* 139* 264* 241* -- --  BUN 37* 42* 46* 54* 58* -- --  CREATININE 1.43* 1.41* 1.33 1.41* 1.41* -- --  CALCIUM 8.8 8.5 8.7 8.7 9.8 -- --  MG -- 2.1 -- -- 2.1 2.2 2.3  PHOS -- 3.7 -- -- 3.8 4.3 4.4   Liver Function Tests: No results found for this basename: AST:5,ALT:5,ALKPHOS:5,BILITOT:5,PROT:5,ALBUMIN:5 in the last 168 hours No results found for this basename: LIPASE:5,AMYLASE:5 in the last 168 hours No results found for this basename: AMMONIA:5 in the last 168 hours CBC:  Lab 06/30/12 0530 06/29/12 0505 06/28/12 0450 06/27/12 0325 06/26/12 0310  WBC 16.9* 18.6* 21.1* 24.0* 17.7*  NEUTROABS 12.9* 14.7* 18.4* 22.3* 15.9*  HGB 10.9* 10.7* 10.3* 11.4* 10.3*  HCT 32.4* 31.8* 31.2* 34.2* 31.5*  MCV 97.3 96.7 96.6 97.2 99.1  PLT 334 325 334 365 275   Cardiac Enzymes: No results found for this basename: CKTOTAL:5,CKMB:5,CKMBINDEX:5,TROPONINI:5 in the last 168 hours BNP: BNP (last 3 results)  Basename 06/30/12 0530 06/29/12 0839 06/27/12 0325  PROBNP 665.5* 979.8* 2345.0*   CBG:  Lab 07/01/12 0752 06/30/12 2208 06/30/12 1724 06/30/12 1230 06/30/12 0803  GLUCAP 93 239* 179* 92 113*       Signed:  Namira Rosekrans,CHRISTOPHER  [facility] 07/01/2012, 9:43 AM

## 2012-07-01 NOTE — Progress Notes (Signed)
Physical Therapy Treatment Patient Details Name: Clayton [name] MRN: [id number] DOB: 10-18-1927 Today's Date: 07/01/2012 Time: [id number] PT Time Calculation (min): 25 min  PT Assessment / Plan / Recommendation Comments on Treatment Session  Pt with H1N1, previously extubated, communicating well, and cognitively intact.  Decreased O2 saturation and poor activity tolerance today verse previous session. May be secondary to therapy session following morning shower.  Patient O2 evaluated on rm air at request of physicial, patient desaturated to 86% on rm air. Returned to 4 liters with increase to 90% after 2 mins, then increased again to 6 liters to initiate activity with SpO2 at 94% on 6 liters at rest.  Patient had poor tolerance and required 4 extedned rest breaks to improve saturation as it fluctuated between 87 and 90 on 6 L with activity and HR of 124.  Feel patient still demonstrates significant deconditioning and will need continued PT to improve overall functional mobility and activity tolerance.   (Pt re-educated in proper use of Incentive Spirometer )    Follow Up Recommendations  CIR           Equipment Recommendations  Rolling walker with 5" wheels    Recommendations for Other Services Rehab consult  Frequency Min 3X/week   Plan Discharge plan remains appropriate;Frequency remains appropriate    Precautions / Restrictions Precautions Precautions: Fall Restrictions Weight Bearing Restrictions: No   Pertinent Vitals/Pain SpO2 values fluctuating between upper 80's low 90's on 6L with activity, 4 Liters at rest    Mobility  Bed Mobility Bed Mobility: Not assessed Transfers Transfers: Sit to Stand;Stand to Sit Sit to Stand: 4: Min assist;From chair/3-in-1;With armrests Stand to Sit: 4: Min guard;To chair/3-in-1;With armrests Details for Transfer Assistance: VC's for hand placement; assist to initiate stand Ambulation/Gait Ambulation/Gait Assistance: 4: Min guard;4: Min  assist Ambulation Distance (Feet): 40 Feet (multiple rest breaks required to maitain O2 saturations) Assistive device: Rolling walker Ambulation/Gait Assistance Details: VC's for controlled gait, upright posture and pursed lip breathing.  Gait Pattern: Step-to pattern;Trunk flexed;Decreased stride length;Decreased hip/knee flexion - right;Decreased hip/knee flexion - left;Decreased step length - right;Decreased step length - left (emerging step through) Gait velocity: decr Stairs: No Wheelchair Mobility Wheelchair Mobility: No      PT Goals Acute Rehab PT Goals Pt will Sit at Edge of Bed: Independently Pt will go Sit to Stand: Independently PT Goal: Sit to Stand - Progress: Progressing toward goal Pt will go Stand to Sit: Independently PT Goal: Stand to Sit - Progress: Progressing toward goal Pt will Stand: with modified independence;3 - 5 min;with unilateral upper extremity support PT Goal: Stand - Progress: Progressing toward goal Pt will Ambulate: >150 feet;with modified independence;with least restrictive assistive device PT Goal: Ambulate - Progress: Progressing toward goal  Visit Information  Last PT Received On: 07/01/12 Assistance Needed: +2 (for safety and equipment management (O2) and dinamap)    Subjective Data  Subjective: "Feel better now that I had a shower" Patient Stated Goal: to feel better and go home   Cognition  Overall Cognitive Status: Appears within functional limits for tasks assessed/performed Arousal/Alertness: Awake/alert Orientation Level: Appears intact for tasks assessed Behavior During Session: [facility] for tasks performed Safety/Judgement - Other Comments: Pt not fully aware of deficits, req cues to attend to sequencing and safety during session    Balance  Balance Balance Assessed: Yes Static Standing Balance Static Standing - Balance Support: Bilateral upper extremity supported;During functional activity Static Standing - Level of Assistance:  5: Stand by assistance  Static Standing - Comment/# of Minutes: with breathing activities during 4 standing rest breaks; 2 mins, 1 min, 1 min, 2 mins  End of Session PT - End of Session Equipment Utilized During Treatment: Oxygen (4Liters in room in adequate for activity increased to 6 L ) Activity Tolerance: Patient limited by fatigue;Other (comment) (limited due to O2 sats) Patient left: in chair;with call bell/phone within reach Nurse Communication: Mobility status (SpO2 saturation levels with activity)   GP     Duncan Dull 07/01/2012, 10:18 AM Alben Deeds, PT DPT  [phone]

## 2012-07-01 NOTE — Progress Notes (Signed)
[facility] PROGRESS NOTE  Clayton [name] [id number] DOB: [birth year] DOA: 06/18/2012 PCP: No primary provider on file.  Assessment/Plan: Principal Problem:  *H1N1 influenza with pneumonia Active Problems:  HYPERTENSION  Hypoxia  Diabetes mellitus  Chronic kidney disease  Hard of hearing  Acute respiratory failure  Non-small cell lung cancer    1. Ventilator-dependent respiratory failure: Patient presented in acute respiratory distress with hypoxia, and required intubation/Ventilation. Flu PCR was positive for Influenza A/H1N1 and imaging studies demonstrated possible superimposed pneumonia with left lower lobe consolidation. Tracheal aspirate was positive for MRSA. He was managed with a full course of Tamiflu in the ICU setting, and treated with Vancomycin, then Zyvox, and subsequently Doxycycline, in combination with supportive measures. Clinical response was satisfactory, and he was extubated on 06/26/12. Oxygen requirements are gradually improving and patient is saturating at 92%-98% on 4L.  2. Bronchial squamous cell carcinoma: Patient is s/p bronchoscopy on 06/21/12, by Dr [name], which confirmed purulent tracheobronchitis. Surgical pathology of bronchus biopsy revealed squamous cell carcinoma with necrosis (per Dr [name] this was 1 cm piece of mass that was suctioned during bronchoscopy, site possibly Bronchus intermedius ). Followup with Dr. [name], pulmonologist, as an outpatient has been scheduled for Wednesday, July 23, 2012 at 2:30 PM. 3. Diabetes mellitus: Somewhat labile, but reasonably controlled on diet, scheduled Lantus and SSI. Marland Kitchen 4. Suspected chronic kidney disease: Creatinine has ranged at about 1.26-1.43 during this hospitalization, and has remained stable. Unfortunately, no baseline creatinine in EMR. Followup as an outpatient. 4. Renal cysts: Patient underwent renal ultrasound on 06/20/12, which showed bilateral hypoechoic renal cortical lesions which  are incompletely characterized sonographically but could represent cysts.  Definitive characterization is recommended with renal mass protocol MRI with contrast performed on a non emergent outpatient basis. Alternatively, if the patient cannot undergo MRI, consider follow-up renal ultrasound in 6-12 months. This will be deferred to Dr [name].  5. Diarrhea: Patient experienced diarrhea during hospitalization, which is probably antibiotic-associated. Fortunately, C. difficile PCR was negative. Managed satisfactorily with Imodium. Flexiseal was discontinued on 06/30/12, without deleterious effect. 6. Anemia: This is normocytic, due to chronic disease and critical illness. HB is stable/reasonable.  7. Deconditioning: Patient is profoundly deconditioned, due to recent acute illness. Evaluated by PT/OT and rehab, recommended.   Comment: Discharge held on 07/01/12, due to bed unavailability. Today, no new issues, clinically stable. For discharge today.   Code Status: Full Code.  Family Communication:  Disposition Plan: Aiming discharge today.    Brief narrative: 79 y.o. male with a PMHX of DM2, CKD, HTN who was admitted to [facility] on 06/19/2012 with acute respiratory failure secondary to H1N1 and requiring intubation on 06/22/2012. Now extubated since 1/9 tolerating.    Consultants:  N/A.   Procedures:  CXRs.   Bronchoscopy 06/25/12.   Antibiotics:  Doxycycline 06/29/12-07/01/12  HPI/Subjective: No new issues.   Objective: Vital signs in last 24 hours: Temp:  [98.1 F (36.7 C)-98.2 F (36.8 C)] 98.1 F (36.7 C) (01/15 0450) Pulse Rate:  [72-84] 72  (01/15 0621) Resp:  [20-21] 21  (01/15 0450) BP: (100-131)/(37-65) 128/50 mmHg (01/15 0621) SpO2:  [92 %-95 %] 94 % (01/15 0450) Weight:  [112 kg (246 lb 14.6 oz)] 112 kg (246 lb 14.6 oz) (01/15 0757) Weight change:  Last BM Date: 07/01/12  Intake/Output from previous day: 01/14 0701 - 01/15 0700 In: 723 [P.O.:720; I.V.:3] Out: 1675  [Urine:1675] Total I/O In: 360 [P.O.:360] Out: 300 [Urine:300]   Physical Exam: General: Comfortable, alert, communicative,  fully oriented, not short of breath at rest.  HEENT:  Mild clinical pallor, no jaundice, no conjunctival injection or discharge. NECK:  Supple, JVP not seen, no carotid bruits, no palpable lymphadenopathy, no palpable goiter. CHEST:  Clinically clear to auscultation, no wheezes, no crackles. HEART:  Sounds 1 and 2 heard, normal, regular, no murmurs. ABDOMEN:  Full, soft, non-tender, no palpable organomegaly, no palpable masses, normal bowel sounds. GENITALIA:  Not examined. LOWER EXTREMITIES:  No pitting edema, palpable peripheral pulses. MUSCULOSKELETAL SYSTEM:  Generalized osteoarthritic changes, otherwise, normal. CENTRAL NERVOUS SYSTEM:  No focal neurologic deficit on gross examination.  Lab Results:  Basename 06/30/12 0530  WBC 16.9*  HGB 10.9*  HCT 32.4*  PLT 334    Basename 07/01/12 0525 06/30/12 0530  NA 139 139  K 4.3 4.2  CL 101 100  CO2 28 28  GLUCOSE 111* 106*  BUN 37* 42*  CREATININE 1.43* 1.41*  CALCIUM 8.8 8.5   Recent Results (from the past 240 hour(s))  CLOSTRIDIUM DIFFICILE BY PCR     Status: Normal   Collection Time   06/23/12 12:40 PM      Component Value Range Status Comment   C difficile by pcr NEGATIVE  NEGATIVE Final   CULTURE, BAL-QUANTITATIVE     Status: Normal   Collection Time   06/25/12  2:46 PM      Component Value Range Status Comment   Specimen Description BRONCHIAL ALVEOLAR LAVAGE   Final    Special Requests NONE   Final    Gram Stain     Final    Value: ABUNDANT WBC PRESENT, PREDOMINANTLY PMN     NO SQUAMOUS EPITHELIAL CELLS SEEN     RARE GRAM POSITIVE COCCI     IN PAIRS   Colony Count 5,000 COLONIES/ML   Final    Culture     Final    Value: METHICILLIN RESISTANT STAPHYLOCOCCUS AUREUS     Note: RIFAMPIN AND GENTAMICIN SHOULD NOT BE USED AS SINGLE DRUGS FOR TREATMENT OF STAPH INFECTIONS. This organism is  presumed to be Clindamycin resistant based on detection of inducible Clindamycin resistance. CRITICAL RESULT CALLED TO, READ BACK BY AND      VERIFIED WITH: MACSERO RN 11AM 06/29/12 GUSTK   Report Status 06/29/2012 FINAL   Final    Organism ID, Bacteria METHICILLIN RESISTANT STAPHYLOCOCCUS AUREUS   Final   CULTURE, RESPIRATORY     Status: Normal   Collection Time   06/25/12  2:46 PM      Component Value Range Status Comment   Specimen Description BRONCHIAL ALVEOLAR LAVAGE   Final    Special Requests NONE   Final    Gram Stain     Final    Value: ABUNDANT WBC PRESENT, PREDOMINANTLY PMN     NO SQUAMOUS EPITHELIAL CELLS SEEN     RARE GRAM POSITIVE COCCI     IN PAIRS   Culture     Final    Value: FEW STAPHYLOCOCCUS AUREUS     Note: SUSCEPTIBILITIES PERFORMED ON PREVIOUS CULTURE WITHIN THE LAST 5 DAYS.   Report Status 06/29/2012 FINAL   Final   HERPES SIMPLEX VIRUS CULTURE     Status: Normal   Collection Time   06/25/12  2:47 PM      Component Value Range Status Comment   Specimen Description BRONCHIAL ALVEOLAR LAVAGE   Final    Special Requests NONE   Final    Culture No Herpes Simplex Detected in Cell Culture  Final    Report Status 06/30/2012 FINAL   Final      Studies/Results: No results found.  Medications: Scheduled Meds:    . albuterol  2.5 mg Nebulization BID  . diltiazem  90 mg Oral Q6H  . doxycycline  100 mg Oral Q12H  . famotidine  20 mg Oral Daily  . feeding supplement  237 mL Oral BID BM  . finasteride  5 mg Oral Daily  . heparin subcutaneous  5,000 Units Subcutaneous Q8H  . insulin aspart  0-15 Units Subcutaneous TID WC  . insulin aspart  8 Units Subcutaneous TID WC  . insulin glargine  20 Units Subcutaneous BID  . LORazepam  1 mg Per Tube TID  . potassium chloride  40 mEq Oral Daily  . simvastatin  20 mg Oral q1800  . sodium chloride  3 mL Intravenous Q12H  . tiotropium  18 mcg Inhalation Daily   Continuous Infusions:  PRN Meds:.acetaminophen, acetaminophen,  albuterol, alum & mag hydroxide-simeth, labetalol, loperamide, LORazepam, ondansetron (ZOFRAN) IV    LOS: 14 days   [facility] Pager [phone]. If 8PM-8AM, please contact night-coverage at [email or web address], password [facility] 07/02/2012, 9:02 AM  LOS: 14 days

## 2012-07-01 NOTE — Clinical Social Work Note (Signed)
Clinical Social Worker has been working with patient's daughter, [name] regarding bed confirmation and they decided on [facility] as first choice and [facility] as second. CSW followed up with [facility] and they were able to offer a semi-private room and daughter and patient was agreeable to it. CSW spoke with Zenon Mayo, [facility] rep regarding choice. Patient will need authorization from [facility] before patient can transfer to facility. CSW is awaiting response from [facility].   Leandro Reasoner MSW, [place]

## 2012-07-02 LAB — GLUCOSE, CAPILLARY: Glucose-Capillary: 148 mg/dL — ABNORMAL HIGH (ref 70–99)

## 2012-07-02 NOTE — Clinical Social Work Note (Signed)
Clinical Education officer, museum received call from [place], Development worker, international aid at [employer], and they received authorization from [place]. Patient's daughter, [name] will be transporting patient to facility. CSW will give discharge packet to daughter. CSW will sign off, as social work intervention is no longer needed.   Leandro Reasoner MSW, [place]

## 2012-07-02 NOTE — Progress Notes (Signed)
DC TO [facility], TRANSPORTATION BY DAUGHTER. REPORT CALLED AT THIS TIME.

## 2012-07-07 ENCOUNTER — Telehealth: Payer: Self-pay | Admitting: Internal Medicine

## 2012-07-07 NOTE — Telephone Encounter (Signed)
Cannot his SNF-rehab (who is he staying with ?) not set up these ? If they cannot, yes you can set up neb machine  I want to know is BD regimen though  MR

## 2012-07-07 NOTE — Telephone Encounter (Signed)
I spoke with daughter. She stated she has been calling everyone today and his case manager is working on this for him and will get this set up for him. He will have this tomorrow before he leaves the facility. They needed nothing from [place]. Will sign off message

## 2012-07-07 NOTE — Telephone Encounter (Signed)
I spoke with daughter and she stated pt will be released from pulm rehab tomorrow. He receives breathing tx's from there. Pt believes he needs this and is requesting to be set up on this at home. Pt is set up to see MR for HFU 07/23/12. Please advise MR thanks

## 2012-07-07 NOTE — Telephone Encounter (Signed)
Pt is scheduled w/ a HFU w/ MR on 07/23/12 per VS.  However, pt does see RA for sleep issues.  Unsure who his DME is for his CPAP.  Satira Anis

## 2012-07-07 NOTE — Telephone Encounter (Deleted)
Pt is scheduled as a HFU w/ MR on 07/23/12 per VS.  However, pt

## 2012-07-23 ENCOUNTER — Ambulatory Visit (INDEPENDENT_AMBULATORY_CARE_PROVIDER_SITE_OTHER): Payer: BC Managed Care – PPO | Admitting: Internal Medicine

## 2012-07-23 ENCOUNTER — Encounter: Payer: Self-pay | Admitting: Internal Medicine

## 2012-07-23 VITALS — BP 120/70 | HR 92 | Temp 97.9°F | Ht 70.0 in | Wt 236.4 lb

## 2012-07-23 DIAGNOSIS — R053 Chronic cough: Secondary | ICD-10-CM

## 2012-07-23 DIAGNOSIS — G4733 Obstructive sleep apnea (adult) (pediatric): Secondary | ICD-10-CM

## 2012-07-23 DIAGNOSIS — Z87891 Personal history of nicotine dependence: Secondary | ICD-10-CM

## 2012-07-23 DIAGNOSIS — R05 Cough: Secondary | ICD-10-CM

## 2012-07-23 DIAGNOSIS — C3490 Malignant neoplasm of unspecified part of unspecified bronchus or lung: Secondary | ICD-10-CM

## 2012-07-23 DIAGNOSIS — G4730 Sleep apnea, unspecified: Secondary | ICD-10-CM

## 2012-07-23 DIAGNOSIS — R059 Cough, unspecified: Secondary | ICD-10-CM

## 2012-07-23 MED ORDER — IPRATROPIUM-ALBUTEROL 0.5-2.5 (3) MG/3ML IN SOLN: 3.0000 mL | RESPIRATORY_TRACT | Status: DC | PRN

## 2012-07-23 NOTE — Progress Notes (Signed)
Subjective:    Patient ID: Clayton [name], male    DOB: 1927/08/13, 79 y.o.   MRN: [id number]  HPI  Admit date: 06/18/2012  Discharge date: 07/01/2012  Time spent: 40 minutes  Recommendations for Outpatient Follow-up:  1. Follow up with SNF MD. Discharge Diagnoses:  Principal Problem:  *H1N1 influenza with pneumonia  Active Problems:  HYPERTENSION  Hypoxia  Diabetes mellitus  Chronic kidney disease  Hard of hearing  Acute respiratory failure  Non-small cell lung cancer     History of present illness:  79 y.o. male with a PMHX of DM2, CKD, HTN who was admitted to [facility] on 06/19/2012 with acute respiratory failure secondary to H1N1 and requiring intubation on 06/22/2012. Extubated on 06/26/12.  Hospital Course:  1. Ventilator-dependent respiratory failure: Patient presented in acute respiratory distress with hypoxia, and required intubation/Ventilation. Flu PCR was positive for Influenza A/H1N1 and imaging studies demonstrated possible superimposed pneumonia with left lower lobe consolidation. Tracheal aspirate was positive for MRSA. He was managed with a full course of Tamiflu in the ICU setting, and treated with Vancomycin, then Zyvox, and subsequently Doxycycline, in combination with supportive measures. Clinical response was satisfactory, and he was extubated on 06/26/12. Oxygen requirements have gradually improved and patient is saturating at 92%-98% on 4L at rest, although he desaturates to 87% on ambulation. On bronchodilators and Spiriva. Antibiotics were discontinued on 07/01/12.  2. Bronchial squamous cell carcinoma: Patient is s/p bronchoscopy on 06/21/12, by Dr [name], which confirmed purulent tracheobronchitis. Surgical pathology of bronchus biopsy revealed squamous cell carcinoma with necrosis (per Dr [name] this was 1 cm piece of mass that was suctioned during bronchoscopy, site possibly Bronchus intermedius ). Followup with Dr. [name], pulmonologist, as an outpatient  has been scheduled for Wednesday, July 23, 2012 at 2:30 PM.  3. Diabetes mellitus: Somewhat labile, but reasonably controlled on diet, scheduled Lantus and SSI.  4. Suspected chronic kidney disease: Creatinine ranged at about 1.26-1.43 during this hospitalization, and has remained stable. Unfortunately, no baseline creatinine in EMR. Followup as an outpatient.  5. Renal cysts: Patient underwent renal ultrasound on 06/20/12, which showed bilateral hypoechoic renal cortical lesions which are incompletely characterized sonographically but could represent cysts.  Definitive characterization is recommended with renal mass protocol MRI with contrast performed on a non emergent outpatient basis. Alternatively, if the patient cannot undergo MRI, consider follow-up renal ultrasound in 6-12 months. This will be deferred to Dr [name].  6. Diarrhea: Patient experienced diarrhea during hospitalization, which is probably antibiotic-associated. Fortunately, C. difficile PCR was negative. Managed satisfactorily with Imodium. Flexiseal was discontinued on 06/30/12, without deleterious effect.  67 Anemia: This is normocytic, due to chronic disease and critical illness. HB is stable/reasonable.  7. Deconditioning: Patient is profoundly deconditioned, due to recent acute illness. Evaluated by PT/OT and rehab, recommended.    OV 07/23/2012  Presents with daughter for followup of the above in particular for post influenza pneumonia MRSA acute respiratory failure requiring mechanical ventilation and subsequent deconditioning. During this admission a routine bronchoscopy to facilitate weaning during which an incidental polypoid lesion was suctioned of possibly from his right main bronchus but subsequent diagnosed as bronchial squamous cell carcinoma.  He is currently feeling well. He's had one week in rehabilitation. Currently walking with her walker and 50-70% to his baseline and quite functional. Daughter has moved in with  him. He is compliant with his medications. He is decided to restart his CPAP which he used to use long time ago for sleep apnea.  He had overnight oxygen study on room air ONO - 07/18/12 - desa <88% is 47% of sleep time at 2h 59min. despite the significant desaturations with sleep hisWalking desaturation test on 07/23/2012 185 feet x 3 laps:  did not desaturate. Rest pulse ox was 94%, final pulse ox was 90%. HR response 102/min at rest to 116/min at peak exertion.  The main issues other than his deconditioning and sleep apnea  - Diagnoses of lung cancer. He is keen to undergo staging workup and figure his treatment options  - Chronic cough: This has been there for many years. He feels though he is improved from his ICU admission this chronic cough has returned. His RSI cough score is 15 and cough differentiated is 0 for acid reflux and 1 for neurogenic or airway related cough. There is associated postnasal drip, clearing and gagging of throat. He is willing to wait until cancer ISSUES are sorted out address his cough     CAT COPD Symptom & Quality of Life Score ([employer] trademark) 0 is no burden. 5 is highest burden 07/23/2012   Never Cough -> Cough all the time 3  No phlegm in chest -> Chest is full of phlegm 3  No chest tightness -> Chest feels very tight 0  No dyspnea for 1 flight stairs/hill -> Very dyspneic for 1 flight of stairs 5  No limitations for ADL at home -> Very limited with ADL at home 4  Confident leaving home -> Not at all confident leaving home 0  Sleep soundly -> Do not sleep soundly because of lung condition 0  Lots of Energy -> No energy at all 4  TOTAL Score (max 40)  19     Dr Lorenza Cambridge Reflux Symptom Index (> 13-15 suggestive of LPR cough) 0 -> 5  =  none ->severe problem  Hoarseness of problem with voice 2  Clearing  Of Throat 3  Excess throat mucus or feeling of post nasal drip 4  Difficulty swallowing food, liquid or tablets 0  Cough after eating or lying down 0   Breathing difficulties or choking episodes 0  Troublesome or annoying cough 3  Sensation of something sticking in throat or lump in throat 3  Heartburn, chest pain, indigestion, or stomach acid coming up 0  TOTAL 15     Kouffman Reflux v Neurogenic Cough Differentiator Reflux  Do you awaken from a sound sleep coughing violently?                            With trouble breathing? n  Do you have choking episodes when you cannot  Get enough air, gasping for air ?              n  Do you usually cough when you lie down into  The bed, or when you just lie down to rest ?                          n  Do you usually cough after meals or eating?         n  Do you cough when (or after) you bend over?    n  GERD SCORE  0  Kouffman Reflux v Neurogenic Cough Differentiator Neurogenic  Do you more-or-less cough all day long? y  Does change of temperature make you cough? n  Does laughing or chuckling cause you to cough? n  Do fumes (perfume, automobile fumes, burned  Toast, etc.,) cause you to cough ?      n  Does speaking, singing, or talking on the phone cause you to cough   ?               n  Neurogenic/Airway score 1     Review of Systems  Constitutional: Negative for fever and unexpected weight change.  HENT: Negative for ear pain, nosebleeds, congestion, sore throat, rhinorrhea, sneezing, trouble swallowing, dental problem, postnasal drip and sinus pressure.   Eyes: Negative for redness and itching.  Respiratory: Positive for cough. Negative for chest tightness and wheezing.   Cardiovascular: Negative for palpitations and leg swelling.  Gastrointestinal: Negative for nausea and vomiting.  Genitourinary: Negative for dysuria.  Musculoskeletal: Negative for joint swelling.  Skin: Negative for rash.  Neurological: Positive for weakness. Negative for headaches.  Hematological: Does not bruise/bleed easily.  Psychiatric/Behavioral: Negative for dysphoric mood. The patient is not  nervous/anxious.        Objective:   Physical Exam  Nursing note and vitals reviewed. Constitutional: He is oriented to person, place, and time. He appears well-developed and well-nourished. No distress.  HENT:  Head: Normocephalic and atraumatic.  Right Ear: External ear normal.  Left Ear: External ear normal.  Mouth/Throat: Oropharynx is clear and moist. No oropharyngeal exudate.  Eyes: Conjunctivae normal and EOM are normal. Pupils are equal, round, and reactive to light. Right eye exhibits no discharge. Left eye exhibits no discharge. No scleral icterus.  Neck: Normal range of motion. Neck supple. No JVD present. No tracheal deviation present. No thyromegaly present.  Cardiovascular: Normal rate, regular rhythm and intact distal pulses.  Exam reveals no gallop and no friction rub.   No murmur heard. Pulmonary/Chest: Effort normal and breath sounds normal. No respiratory distress. He has no wheezes. He has no rales. He exhibits no tenderness.  Abdominal: Soft. Bowel sounds are normal. He exhibits no distension and no mass. There is no tenderness. There is no rebound and no guarding.  Musculoskeletal: Normal range of motion. He exhibits no edema and no tenderness.  Lymphadenopathy:    He has no cervical adenopathy.  Neurological: He is alert and oriented to person, place, and time. He has normal reflexes. No cranial nerve deficit. Coordination normal.  Skin: Skin is warm and dry. No rash noted. He is not diaphoretic. No erythema. No pallor.  Psychiatric: He has a normal mood and affect. His behavior is normal. Judgment and thought content normal.          Assessment & Plan:

## 2012-07-23 NOTE — Patient Instructions (Addendum)
#  lung cancer  - In order to better sort out staging please have PET scan mid February 2014  #History of smoking -Please do walk test for oxygen levels today -Please do pulmonary function test mid February 2014  #Chronic cough -To cough and COPD CAT score today and give it to my nurse. I will address these at followup-  - for now use duoneb as needed, will sort out at followup  #Baseline sleep apnea  - ok to restart CPAP regime that they've not used in over a year [glad you're new supplies] -Some point we'll get him back to Dr. Elsworth Soho for reevaluation of current status - Liters oxygen at night along with his CPAP because of confirmed desaturation at night on 07/18/2012  #Followup - Mid February 2014 after PET scan and pulmonary function test

## 2012-07-24 DIAGNOSIS — Z87891 Personal history of nicotine dependence: Secondary | ICD-10-CM | POA: Insufficient documentation

## 2012-07-24 DIAGNOSIS — J45991 Cough variant asthma: Secondary | ICD-10-CM | POA: Insufficient documentation

## 2012-07-24 NOTE — Assessment & Plan Note (Signed)
#  Baseline sleep apnea  - ok to restart CPAP regime that they've not used in over a year [glad you're new supplies] -Some point we'll get him back to Dr. Elsworth Soho for reevaluation of current status - 2L Liters oxygen at night along with his CPAP because of confirmed desaturation at night on 07/18/2012  #Followup - Mid February 2014 after PET scan and pulmonary function test

## 2012-07-24 NOTE — Assessment & Plan Note (Signed)
#  Chronic cough -Do cough and COPD CAT score today and give it to my nurse. I will address these at followup-  - for now use duoneb as needed, will sort out at followup

## 2012-07-24 NOTE — Assessment & Plan Note (Signed)
#  History of smoking -Please do walk test for oxygen levels today -Please do pulmonary function test mid February 2014

## 2012-07-24 NOTE — Assessment & Plan Note (Signed)
#  lung cancer  - In order to better sort out staging please have PET scan mid February 2014

## 2012-08-06 ENCOUNTER — Ambulatory Visit (INDEPENDENT_AMBULATORY_CARE_PROVIDER_SITE_OTHER): Payer: BC Managed Care – PPO | Admitting: Internal Medicine

## 2012-08-06 ENCOUNTER — Ambulatory Visit (INDEPENDENT_AMBULATORY_CARE_PROVIDER_SITE_OTHER)
Admission: RE | Admit: 2012-08-06 | Discharge: 2012-08-06 | Disposition: A | Discharge status: Home / Self Care | Payer: Medicare PPO | Source: Ambulatory Visit | Attending: Internal Medicine | Admitting: Internal Medicine

## 2012-08-06 ENCOUNTER — Telehealth: Payer: Self-pay | Admitting: Internal Medicine

## 2012-08-06 ENCOUNTER — Encounter: Payer: Self-pay | Admitting: Internal Medicine

## 2012-08-06 VITALS — BP 120/72 | HR 106 | Temp 98.0°F | Ht 71.0 in | Wt 244.2 lb

## 2012-08-06 DIAGNOSIS — J1000 Influenza due to other identified influenza virus with unspecified type of pneumonia: Secondary | ICD-10-CM

## 2012-08-06 DIAGNOSIS — R053 Chronic cough: Secondary | ICD-10-CM

## 2012-08-06 DIAGNOSIS — R059 Cough, unspecified: Secondary | ICD-10-CM

## 2012-08-06 MED ORDER — IPRATROPIUM-ALBUTEROL 0.5-2.5 (3) MG/3ML IN SOLN: 3.0000 mL | RESPIRATORY_TRACT | Status: DC | PRN

## 2012-08-06 MED ORDER — PREDNISONE (PAK) 10 MG PO TABS: ORAL_TABLET | ORAL | Status: DC

## 2012-08-06 MED ORDER — ALBUTEROL SULFATE (5 MG/ML) 0.5% IN NEBU: 2.5000 mg | INHALATION_SOLUTION | RESPIRATORY_TRACT | Status: DC | PRN

## 2012-08-06 NOTE — Progress Notes (Signed)
Subjective:    Patient ID: Clayton [name], male    DOB: Sep 04, 1927 .   MRN: [id number]  Brief patient profile:  84 yowm quit smoking 1977 with no resp problems   Admit date: 06/18/2012  Discharge date: 07/01/2012 > discharged 07/08/12 walking with walker Recommendations for Outpatient Follow-up:  1. Follow up with SNF MD. Discharge Diagnoses:  Principal Problem:  *H1N1 influenza with pneumonia  Active Problems:  HYPERTENSION  Hypoxia  Diabetes mellitus  Chronic kidney disease  Hard of hearing  Acute respiratory failure  Non-small cell lung cancer     History of present illness:  79 y.o. male with a PMHX of DM2, CKD, HTN who was admitted to [facility] on 06/19/2012 with acute respiratory failure secondary to H1N1 and requiring intubation on 06/22/2012. Extubated on 06/26/12.  Hospital Course:  1. Ventilator-dependent respiratory failure: Patient presented in acute respiratory distress with hypoxia, and required intubation/Ventilation. Flu PCR was positive for Influenza A/H1N1 and imaging studies demonstrated possible superimposed pneumonia with left lower lobe consolidation. Tracheal aspirate was positive for MRSA. He was managed with a full course of Tamiflu in the ICU setting, and treated with Vancomycin, then Zyvox, and subsequently Doxycycline, in combination with supportive measures. Clinical response was satisfactory, and he was extubated on 06/26/12. Oxygen requirements have gradually improved and patient is saturating at 92%-98% on 4L at rest, although he desaturates to 87% on ambulation. On bronchodilators and Spiriva. Antibiotics were discontinued on 07/01/12.  2. Bronchial squamous cell carcinoma: Patient is s/p bronchoscopy on 06/21/12, by Dr [name], which confirmed purulent tracheobronchitis. Surgical pathology of bronchus biopsy revealed squamous cell carcinoma with necrosis (per Dr [name] this was 1 cm piece of mass that was suctioned during bronchoscopy, site possibly Bronchus  intermedius ). Followup with Dr. [name], pulmonologist, as an outpatient has been scheduled for Wednesday, July 23, 2012 at 2:30 PM.  3. Diabetes mellitus: Somewhat labile, but reasonably controlled on diet, scheduled Lantus and SSI.  4. Suspected chronic kidney disease: Creatinine ranged at about 1.26-1.43 during this hospitalization, and has remained stable. Unfortunately, no baseline creatinine in EMR. Followup as an outpatient.  5. Renal cysts: Patient underwent renal ultrasound on 06/20/12, which showed bilateral hypoechoic renal cortical lesions which are incompletely characterized sonographically but could represent cysts.  Definitive characterization is recommended with renal mass protocol MRI with contrast performed on a non emergent outpatient basis. Alternatively, if the patient cannot undergo MRI, consider follow-up renal ultrasound in 6-12 months. This will be deferred to Dr [name].  6. Diarrhea: Patient experienced diarrhea during hospitalization, which is probably antibiotic-associated. Fortunately, C. difficile PCR was negative. Managed satisfactorily with Imodium. Flexiseal was discontinued on 06/30/12, without deleterious effect.  67 Anemia: This is normocytic, due to chronic disease and critical illness. HB is stable/reasonable.  7. Deconditioning: Patient is profoundly deconditioned, due to recent acute illness. Evaluated by PT/OT and rehab, recommended.    OV 07/23/2012  Presents with daughter for followup of the above in particular for post influenza pneumonia MRSA acute respiratory failure requiring mechanical ventilation and subsequent deconditioning. During this admission a routine bronchoscopy to facilitate weaning during which an incidental polypoid lesion was suctioned of possibly from his right main bronchus but subsequent diagnosed as bronchial squamous cell carcinoma.  He is currently feeling well. He's had one week in rehabilitation. Currently walking with her walker  and 50-70% to his baseline and quite functional. Daughter has moved in with him. He is compliant with his medications. He is decided to restart his  CPAP which he used to use long time ago for sleep apnea. He had overnight oxygen study on room air ONO - 07/18/12 - desa <88% is 47% of sleep time at 2h 36min. despite the significant desaturations with sleep hisWalking desaturation test on 07/23/2012 185 feet x 3 laps:  did not desaturate. Rest pulse ox was 94%, final pulse ox was 90%. HR response 102/min at rest to 116/min at peak exertion.  The main issues other than his deconditioning and sleep apnea  - Diagnoses of lung cancer. He is keen to undergo staging workup and figure his treatment options  - Chronic cough: This has been there for many years. He feels though he is improved from his ICU admission this chronic cough has returned. His RSI cough score is 15 and cough differentiated is 0 for acid reflux and 1 for neurogenic or airway related cough. There is associated postnasal drip, clearing and gagging of throat. He is willing to wait until cancer ISSUES are sorted out address his cough #lung cancer  - In order to better sort out staging please have PET scan mid February 2014  #History of smoking -Please do walk test for oxygen levels today -Please do pulmonary function test mid February 2014  #Chronic cough -To cough and COPD CAT score today and give it to my nurse. I will address these at followup-  - for now use duoneb as needed, will sort out at followup  #Baseline sleep apnea  - ok to restart CPAP regime that they've not used in over a year [glad you're new supplies] -Some point we'll get him back to Dr. Elsworth Soho for reevaluation of current status - Liters oxygen at night along with his CPAP because of confirmed desaturation at night on 07/18/2012  #Followup - Mid February 2014 after PET scan and pulmonary function test  08/06/2012 acute w/in ov/Wert cc slowing improving sob and strength  but severe rattling cough 24/7 min productive white mucus.  No obvious daytime variabilty or assoc chronic cough or cp or chest tightness, subjective wheeze overt sinus or hb symptoms. No unusual exp hx or h/o childhood pna/ asthma or premature birth to his knowledge.     Also denies any obvious fluctuation of symptoms with weather or environmental changes or other aggravating or alleviating factors except as outlined above   ROS  The following are not active complaints unless bolded sore throat, dysphagia, dental problems, itching, sneezing,  nasal congestion or excess/ purulent secretions, ear ache,   fever, chills, sweats, unintended wt loss, pleuritic or exertional cp, hemoptysis,  orthopnea pnd or leg swelling, presyncope, palpitations, heartburn, abdominal pain, anorexia, nausea, vomiting, diarrhea  or change in bowel or urinary habits, change in stools or urine, dysuria,hematuria,  rash, arthralgias, visual complaints, headache, numbness weakness or ataxia or problems with walking or coordination,  change in mood/affect or memory.           Objective:   obese wm with rattling cough  Wt Readings from Last 3 Encounters:  08/06/12 244 lb 3.2 oz (110.768 kg)  07/23/12 236 lb 6.4 oz (107.23 kg)  07/02/12 246 lb 14.6 oz (112 kg)     HEENT mild turbinate edema.  Oropharynx no thrush or excess pnd or cobblestoning.  No JVD or cervical adenopathy. Mild accessory muscle hypertrophy. Trachea midline, nl thryroid. Chest was hyperinflated by percussion with diminished breath sounds and moderate increased exp time with bilateral exp wheeze. Hoover sign positive at mid inspiration. Regular rate and rhythm without murmur  gallop or rub or increase P2 or edema.  Abd: no hsm, nl excursion. Ext warm without cyanosis or clubbing.    CXR  08/06/2012 :  Improved left lower lobe pneumonia.       Assessment & Plan:

## 2012-08-06 NOTE — Assessment & Plan Note (Signed)
The most common causes of chronic cough in immunocompetent adults include the following: upper airway cough syndrome (UACS), previously referred to as postnasal drip syndrome (PNDS), which is caused by variety of rhinosinus conditions; (2) asthma; (3) GERD; (4) chronic bronchitis from cigarette smoking or other inhaled environmental irritants; (5) nonasthmatic eosinophilic bronchitis; and (6) bronchiectasis.   These conditions, singly or in combination, have accounted for up to 94% of the causes of chronic cough in prospective studies.   Other conditions have constituted no >6% of the causes in prospective studies These have included bronchogenic carcinoma, chronic interstitial pneumonia, sarcoidosis, left ventricular failure, ACEI-induced cough, and aspiration from a condition associated with pharyngeal dysfunction.    Chronic cough is often simultaneously caused by more than one condition. A single cause has been found from 38 to 82% of the time, multiple causes from 18 to 62%. Multiply caused cough has been the result of three diseases up to 42% of the time.      This cough is clearly multifactorial and therefore needs a multiple pronged approach:  Rx max GERD (stop fosfamax for now), stop all dpi's and just use nebs for airway control  Proceed for w/u for endobronchial Sq cell ca.    Each maintenance medication was reviewed in detail including most importantly the difference between maintenance and as needed and under what circumstances the prns are to be used.  Please see instructions for details which were reviewed in writing and the patient given a copy.

## 2012-08-06 NOTE — Telephone Encounter (Signed)
Last ov 2.5.14 HFU w/ MR Upcoming PFT and ov 3.3.14  LMOM TCB x1 for pt's [name].

## 2012-08-06 NOTE — Patient Instructions (Addendum)
Please remember to go to the xray department downstairs for your tests - we will call you with the results when they are available.     Stop fosfamax until cough better control  For cough/ congestion/sob > take neb every 4 hours as needed  Prednisone 10 mg take  4 each am x 2 days,   2 each am x 2 days,  1 each am x2days and stop   Try prilosec 20mg   Take 30-60 min before first meal of the day and Pepcid 20 mg one bedtime until cough is completely gone for at least a week without the need for cough suppression  I think of reflux for chronic cough like I do oxygen for fire (doesn't cause the fire but once you get the oxygen suppressed it usually goes away regardless of the exact cause).  Marland Kitchen GERD (REFLUX)  is an extremely common cause of respiratory symptoms, many times with no significant heartburn at all.    It can be treated with medication, but also with lifestyle changes including avoidance of late meals, excessive alcohol, smoking cessation, and avoid fatty foods, chocolate, peppermint, colas, red wine, and acidic juices such as orange juice.  NO MINT OR MENTHOL PRODUCTS SO NO COUGH DROPS  USE SUGARLESS CANDY INSTEAD (jolley ranchers or Stover's)  NO OIL BASED VITAMINS - use powdered substitutes.    Please remember to go to the x-ray department downstairs for your tests - we will call you with the results when they are available.

## 2012-08-06 NOTE — Telephone Encounter (Signed)
Pt's [name] returned call.  She stated that pt is no better since HFU w/ MR on 2.5.14 > has a lot of chest congestion, dry cough, wheezing and tightness.  Denies any increased dyspnea, f/c/s.  Mucinex does not help.  Is almost out of duoneb and would like a refill > last refilled at 2.5.14 #112mL w/ 0 refills > to discuss with MW at [facility].  Has PET scan scheduled for 2.21.14.  [name] would like pt to be seen > scheduled w/ MW for today 2.19.14 @ 4:15pm.  Will sign and forward to MR as FYI.

## 2012-08-08 ENCOUNTER — Ambulatory Visit (HOSPITAL_COMMUNITY): Payer: Medicare PPO

## 2012-08-13 ENCOUNTER — Telehealth: Payer: Self-pay | Admitting: Internal Medicine

## 2012-08-13 ENCOUNTER — Encounter (HOSPITAL_COMMUNITY)
Admission: RE | Admit: 2012-08-13 | Discharge: 2012-08-13 | Disposition: A | Discharge status: Home / Self Care | Payer: Medicare PPO | Source: Ambulatory Visit | Attending: Internal Medicine | Admitting: Internal Medicine

## 2012-08-13 DIAGNOSIS — I517 Cardiomegaly: Secondary | ICD-10-CM | POA: Insufficient documentation

## 2012-08-13 DIAGNOSIS — N289 Disorder of kidney and ureter, unspecified: Secondary | ICD-10-CM | POA: Insufficient documentation

## 2012-08-13 DIAGNOSIS — Z8701 Personal history of pneumonia (recurrent): Secondary | ICD-10-CM | POA: Insufficient documentation

## 2012-08-13 DIAGNOSIS — N323 Diverticulum of bladder: Secondary | ICD-10-CM | POA: Insufficient documentation

## 2012-08-13 DIAGNOSIS — C3490 Malignant neoplasm of unspecified part of unspecified bronchus or lung: Secondary | ICD-10-CM

## 2012-08-13 DIAGNOSIS — I2510 Atherosclerotic heart disease of native coronary artery without angina pectoris: Secondary | ICD-10-CM | POA: Insufficient documentation

## 2012-08-13 DIAGNOSIS — J9819 Other pulmonary collapse: Secondary | ICD-10-CM | POA: Insufficient documentation

## 2012-08-13 DIAGNOSIS — Z87891 Personal history of nicotine dependence: Secondary | ICD-10-CM

## 2012-08-13 MED ORDER — FLUDEOXYGLUCOSE F - 18 (FDG) INJECTION
17.0000 | Freq: Once | INTRAVENOUS | Status: AC | PRN
  Administered 2012-08-13: 17 via INTRAVENOUS

## 2012-08-13 NOTE — Telephone Encounter (Signed)
[name] (pi) Skull Base To Thigh  08/13/2012  *RADIOLOGY REPORT*  Clinical Data: Initial. treatment strategy for staging of lung cancer.Bronchoscopy demonstrating squamous cell carcinoma, possibly from the bronchus intermedius.  Recent admission for H1N1 pneumonia.  NUCLEAR MEDICINE PET SKULL BASE TO THIGH  Fasting Blood Glucose:  154  Technique:  17 mCi F-18 FDG was injected intravenously. CT data was obtained and used for attenuation correction and anatomic localization only.  (This was not acquired as a diagnostic CT examination.) Additional exam technical data entered on technologist worksheet.  Comparison:  Chest CT 06/19/2012.  No prior PET.  Findings:  Neck: Mild hypermetabolism about the inferior aspect of the left maxillary sinus is likely due to prior inflammation or could be due to adjacent left maxillary surgery.  Chest:  No abnormal hypermetabolism.  No endobronchial hypermetabolism, including within the bronchus intermedius.  Abdomen/Pelvis:  No abnormal hypermetabolism.  Skeleton:  No osseous hypermetabolism.  CT  images performed for attenuation correction demonstrate carotid atherosclerosis.  Cardiomegaly and coronary artery atherosclerosis.  Patchy left base atelectasis.  Bilateral low-density renal lesions which are likely cysts. Colonic stool burden suggests constipation.  Small right-sided bladder diverticulum on image 230/series 2.  IMPRESSION:  1.  No evidence of hypermetabolism to suggest primary bronchogenic carcinoma, including within the bronchial tree. 2.  Right-sided bladder diverticulum, suggesting a component of bladder outlet obstruction.   Original Report Authenticated By: Abigail Miyamoto, M.D.     Called patient and dtr Clayton [name]  - pet inactive  ddx  - false positive bx vs sub cm airway   Plan  -will need bronch, will disuss this in office   I gave this above to patient and daughter. Will see him next week (appt +). Patient still qith cough. Will need  bronch   Dr. Brand Males, M.D., [facility].C.P Pulmonary and Critical Care Medicine Staff Physician [place] [facility] Pager: [phone], If no answer or between  15:00h - 7:00h: call [phone]  08/13/2012 3:51 PM

## 2012-08-18 ENCOUNTER — Ambulatory Visit (INDEPENDENT_AMBULATORY_CARE_PROVIDER_SITE_OTHER): Payer: Medicare PPO | Admitting: Internal Medicine

## 2012-08-18 ENCOUNTER — Encounter: Payer: Self-pay | Admitting: Internal Medicine

## 2012-08-18 VITALS — BP 152/70 | HR 110 | Temp 97.8°F | Ht 71.0 in | Wt 237.0 lb

## 2012-08-18 DIAGNOSIS — Z87891 Personal history of nicotine dependence: Secondary | ICD-10-CM

## 2012-08-18 DIAGNOSIS — R053 Chronic cough: Secondary | ICD-10-CM

## 2012-08-18 DIAGNOSIS — R059 Cough, unspecified: Secondary | ICD-10-CM

## 2012-08-18 DIAGNOSIS — C3490 Malignant neoplasm of unspecified part of unspecified bronchus or lung: Secondary | ICD-10-CM

## 2012-08-18 LAB — GLUCOSE, CAPILLARY: Glucose-Capillary: 154 mg/dL — ABNORMAL HIGH (ref 70–99)

## 2012-08-18 LAB — PULMONARY FUNCTION TEST

## 2012-08-18 NOTE — Patient Instructions (Addendum)
#  Possible lung cancer - We will do video flexible bronchoscopy on 08/25/2012 Monday at [place] outpatient endoscopy suite sometime in the morning - Please wait to hear from our schedulers with time I have discussed the risks and benefits and limitations of the procedure  #Mild obstructive lung disease - Continue your duo neb at least 2 times a day if not 4 times a day - Start Qvar 80 mcg 2 puff 2 times a day  - My medical assistant will do to the inhaler technique  #Chronic cough - This will be addressed after your bronchoscopy during followup visit  #Followup - One week after bronchoscopy

## 2012-08-18 NOTE — Progress Notes (Signed)
Subjective:    Patient ID: Clayton [name], male    DOB: Jul 11, 1927, 79 y.o.   MRN: [id number]  HPI #Followup  - Influenza related acute respiratory failure on the ventilator and post influenza MRSA pneumonia - January 2014 - Non-small cell lung cancer detected on an endobronchial mucous plug during acute ICU stay January 2014 - Chronic cough that predates above   OV 08/18/2012 - In terms of post ICU acute respiratory failure and critical illness. He is back to normal functional status and is looking for driving an attending the [facility]  - In terms of "lung cancer" - get a PET scan last week. I personally review this. On the lung cut his left lower lobe infiltrate that looks like a scar. However this no abnormal uptake on the PET scan such as lung cancer anywhwew  - In terms of chronic cough: This persists unchanged . He saw Dr. Legrand Como wert recently for chronic cough and was started on proton pump inhibitor twice a day but this has not helped his chronic cough.. his RSI cough score is 18 and suggestive of irritable larynx syndrome. His cough differentiated score is 0 for acid reflux and 2 for neurogenic or airway related cough. His pulmonary function test today is essentially normal except for reduced forced vital capacity, 9% bronchodilator response with FEV1 and in reduced small airway function. Specifically, FEV1 is 2.1 L/80%. FVC 3.1 L/73%. Bronchodilator response FEV1 is 9%. Small airways is 60%. Total lung capacity is 83%. DLCO is 85%.    Dr Lorenza Cambridge Reflux Symptom Index (> 13-15 suggestive of LPR cough) 0 -> 5  =  none ->severe problem 08/18/2012   Hoarseness of problem with voice 5  Clearing  Of Throat 5  Excess throat mucus or feeling of post nasal drip 0  Difficulty swallowing food, liquid or tablets 0  Cough after eating or lying down 2  Breathing difficulties or choking episodes 0  Troublesome or annoying cough 4  Sensation of something sticking in throat or lump in throat 0   Heartburn, chest pain, indigestion, or stomach acid coming up 0  TOTAL 16    Review of Systems  Constitutional: Negative for appetite change and unexpected weight change.  HENT: Positive for congestion. Negative for ear pain, sore throat, sneezing, trouble swallowing and dental problem.   Respiratory: Positive for cough and shortness of breath.   Cardiovascular: Negative for chest pain, palpitations and leg swelling.  Gastrointestinal: Negative for abdominal pain.  Musculoskeletal: Negative for joint swelling.  Skin: Negative for rash.  Psychiatric/Behavioral: Negative for dysphoric mood. The patient is not nervous/anxious.        Objective:   Physical Exam  Nursing note and vitals reviewed. Constitutional: He is oriented to person, place, and time. He appears well-developed and well-nourished. No distress.  Body mass index is 33.07 kg/(m^2).   HENT:  Head: Normocephalic and atraumatic.  Right Ear: External ear normal.  Left Ear: External ear normal.  Mouth/Throat: Oropharynx is clear and moist. No oropharyngeal exudate.  Eyes: Conjunctivae and EOM are normal. Pupils are equal, round, and reactive to light. Right eye exhibits no discharge. Left eye exhibits no discharge. No scleral icterus.  Post nasal drip present  Neck: Normal range of motion. Neck supple. No JVD present. No tracheal deviation present. No thyromegaly present.  Cardiovascular: Normal rate, regular rhythm and intact distal pulses.  Exam reveals no gallop and no friction rub.   No murmur heard. Pulmonary/Chest: Effort normal. No respiratory distress. He  has no wheezes. He has rales. He exhibits no tenderness.  Rales in leftbase  Abdominal: Soft. Bowel sounds are normal. He exhibits no distension and no mass. There is no tenderness. There is no rebound and no guarding.  Musculoskeletal: Normal range of motion. He exhibits no edema and no tenderness.  Lymphadenopathy:    He has no cervical adenopathy.   Neurological: He is alert and oriented to person, place, and time. He has normal reflexes. No cranial nerve deficit. Coordination normal.  Skin: Skin is warm and dry. No rash noted. He is not diaphoretic. No erythema. No pallor.  Psychiatric: He has a normal mood and affect. His behavior is normal. Judgment and thought content normal.          Assessment & Plan:

## 2012-08-18 NOTE — Progress Notes (Signed)
PFT done today. 

## 2012-08-20 NOTE — Assessment & Plan Note (Signed)
#  Mild obstructive lung disease - Continue your duo neb at least 2 times a day if not 4 times a day - Start Qvar 80 mcg 2 puff 2 times a day  - My medical assistant will do to the inhaler technique  #Chronic cough - rest of This will be addressed after your bronchoscopy during followup visit  #Followup - One week after bronchoscopy

## 2012-08-20 NOTE — Assessment & Plan Note (Signed)
#  Possible lung cancer - We will do video flexible bronchoscopy on 08/25/2012 Monday at [place] outpatient endoscopy suite sometime in the morning - Please wait to hear from our schedulers with time I have discussed the risks and benefits and limitations of the procedure

## 2012-08-22 ENCOUNTER — Encounter (HOSPITAL_COMMUNITY): Payer: Self-pay | Admitting: Pharmacy Technician

## 2012-08-25 ENCOUNTER — Telehealth: Payer: Self-pay | Admitting: Internal Medicine

## 2012-08-25 ENCOUNTER — Encounter (HOSPITAL_COMMUNITY)
Admission: RE | Disposition: A | Discharge status: Home / Self Care | Payer: Self-pay | Source: Ambulatory Visit | Attending: Internal Medicine

## 2012-08-25 ENCOUNTER — Encounter (HOSPITAL_COMMUNITY): Payer: Self-pay | Admitting: Radiology

## 2012-08-25 ENCOUNTER — Ambulatory Visit (HOSPITAL_COMMUNITY)
Admission: RE | Admit: 2012-08-25 | Discharge: 2012-08-25 | Disposition: A | Discharge status: Home / Self Care | Payer: Medicare PPO | Source: Ambulatory Visit | Attending: Internal Medicine | Admitting: Internal Medicine

## 2012-08-25 DIAGNOSIS — R040 Epistaxis: Secondary | ICD-10-CM | POA: Insufficient documentation

## 2012-08-25 DIAGNOSIS — Z87891 Personal history of nicotine dependence: Secondary | ICD-10-CM

## 2012-08-25 DIAGNOSIS — R059 Cough, unspecified: Secondary | ICD-10-CM | POA: Insufficient documentation

## 2012-08-25 DIAGNOSIS — C3490 Malignant neoplasm of unspecified part of unspecified bronchus or lung: Secondary | ICD-10-CM | POA: Insufficient documentation

## 2012-08-25 DIAGNOSIS — R053 Chronic cough: Secondary | ICD-10-CM

## 2012-08-25 DIAGNOSIS — R05 Cough: Secondary | ICD-10-CM

## 2012-08-25 HISTORY — PX: VIDEO BRONCHOSCOPY: SHX5072

## 2012-08-25 LAB — GLUCOSE, CAPILLARY: Glucose-Capillary: 191 mg/dL — ABNORMAL HIGH (ref 70–99)

## 2012-08-25 SURGERY — VIDEO BRONCHOSCOPY WITHOUT FLUORO
Anesthesia: Moderate Sedation | Laterality: Bilateral

## 2012-08-25 MED ORDER — MIDAZOLAM HCL 5 MG/ML IJ SOLN
INTRAMUSCULAR | Status: AC
  Filled 2012-08-25: qty 4

## 2012-08-25 MED ORDER — MIDAZOLAM HCL 10 MG/2ML IJ SOLN
INTRAMUSCULAR | Status: DC | PRN
  Administered 2012-08-25: 2 mg via INTRAVENOUS

## 2012-08-25 MED ORDER — PHENYLEPHRINE HCL 0.25 % NA SOLN: 1.0000 | Freq: Four times a day (QID) | NASAL | Status: DC | PRN

## 2012-08-25 MED ORDER — FENTANYL CITRATE 0.05 MG/ML IJ SOLN
INTRAMUSCULAR | Status: AC
  Filled 2012-08-25: qty 4

## 2012-08-25 MED ORDER — FENTANYL CITRATE 0.05 MG/ML IJ SOLN
INTRAMUSCULAR | Status: DC | PRN
  Administered 2012-08-25 (×2): 25 ug via INTRAVENOUS

## 2012-08-25 MED ORDER — LIDOCAINE HCL 2 % EX GEL: Freq: Once | CUTANEOUS | Status: DC

## 2012-08-25 MED ORDER — PHENYLEPHRINE HCL 0.25 % NA SOLN
NASAL | Status: DC | PRN
  Administered 2012-08-25: 2

## 2012-08-25 MED ORDER — LIDOCAINE HCL 2 % EX GEL
CUTANEOUS | Status: DC | PRN
  Administered 2012-08-25: 1

## 2012-08-25 MED ORDER — LIDOCAINE HCL 1 % IJ SOLN
INTRAMUSCULAR | Status: DC | PRN
  Administered 2012-08-25: 6 mL via RESPIRATORY_TRACT

## 2012-08-25 NOTE — Op Note (Signed)
Name:  Clayton [name] MRN:  [id number] DOB:  May 04, 1928  PROCEDURE NOTE  Procedure(s): Flexible bronchoscopy [phone]) Endobronchial biopsy [id number]) of the left upper lobe secondary carina *  Indications:  Chronic cough. Recent diagnosis of Non small lung cancer done in the ICU phase when he had flu and staphylococcal pneumonia  Consent:  Procedure, benefits, risks and alternatives discussed.  Questions answered.  Consent obtained.  Anesthesia:  Moderate sedation Procedure summary:  Appropriate equipment was assembled.  The patient was brought to the endoscopy suiteand identified as [name].  Safety timeout was performed. The patient was placed supine on the operating table, and after topical lidocaine moderate sedation with fentanyl and versed was administered by this operator to achieve RASS goal -2.   After the appropriate level of sedatioon was assured, flexible video bronchoscope was lubricated and inserted through the endotracheal tube.    Airway examination was performed bilaterally to subsegmental level. The carina was sharp  Minimal clear secretions were noted, mucosa appeared normal and no endobronchial lesions were identified. Only at RUL distal bronchi some impacted mucus that was easily suctioned. Other finding was some sore airway possibly (not sure, loooked normal) in the left upper lobe bronchi at secondary carina level.   Bronchial alveolar lavage of the right middle lobe was performed with 100 mL of normal saline and return of 75 mL of fluid, after which the bronchoscope was withdrawn.Note: some epistaxis occurred mid way through bronch while paying attention to left side so it is possible the mucosal exam on the left side upper lobe was obscured by this blood. Epistaxis soon settled.      Endobronchial biopsy x 4 of the LUL secondary carina done   after which bronchoscope was withdrawn.   Specimens sent: Endobronchial biopsy x 4 of the LUL secondary  carina  Impression  - No macroscopic evidence of lung cancer -Normal airway exam except ? LUL secondary carina area - Endobronchial biopsy of LUL  Secondary carina  Complications:  No immediate complications were noted.  Hemodynamic parameters and oxygenation remained stable throughout the procedure.  Estimated blood loss:  < 1ccc     Dr. Brand Males, M.D., [facility].C.P Pulmonary and Critical Care Medicine Staff Physician [place] [facility] Pager: [phone], If no answer or between  15:00h - 7:00h: call [phone]  08/25/2012 9:03 AM

## 2012-08-25 NOTE — Telephone Encounter (Signed)
Open up office for 08/29/12 Friday pm and please give him fu appt to discuss biopsy results anc cough. I have emailed Maryann Conners to open up the office

## 2012-08-25 NOTE — Interval H&P Note (Signed)
Risks of pneumothorax, hemothorax, sedation/anesthesia complications such as cardiac or respiratory arrest or hypotension, stroke and bleeding all explained. Benefits of diagnosis but limitations of non-diagnosis also explained. Patient verbalized understanding and wished to proceed.    In interval, no new changes. His chronic cough persists; ? Some improvement with ICS. Plan today to do airway exam and look for the diagnosis of lung cancer which is negative on PET scan

## 2012-08-25 NOTE — Telephone Encounter (Signed)
Pt scheduled for Friday at [place], [place]

## 2012-08-25 NOTE — Progress Notes (Addendum)
Bronch w/ video intervention performed, bronchial biopsy intervention performed, bronchial washing intervention performed.

## 2012-08-25 NOTE — Interval H&P Note (Signed)
No new problems since last eval< 30 days ago

## 2012-08-25 NOTE — H&P (View-Only) (Signed)
Subjective:    Patient ID: Clayton [name], male    DOB: 09/20/1927, 79 y.o.   MRN: [id number]  HPI #Followup  - Influenza related acute respiratory failure on the ventilator and post influenza MRSA pneumonia - January 2014 - Non-small cell lung cancer detected on an endobronchial mucous plug during acute ICU stay January 2014 - Chronic cough that predates above   OV 08/18/2012 - In terms of post ICU acute respiratory failure and critical illness. He is back to normal functional status and is looking for driving an attending the [facility]  - In terms of "lung cancer" - get a PET scan last week. I personally review this. On the lung cut his left lower lobe infiltrate that looks like a scar. However this no abnormal uptake on the PET scan such as lung cancer anywhwew  - In terms of chronic cough: This persists unchanged . He saw Dr. Legrand Como wert recently for chronic cough and was started on proton pump inhibitor twice a day but this has not helped his chronic cough.. his RSI cough score is 18 and suggestive of irritable larynx syndrome. His cough differentiated score is 0 for acid reflux and 2 for neurogenic or airway related cough. His pulmonary function test today is essentially normal except for reduced forced vital capacity, 9% bronchodilator response with FEV1 and in reduced small airway function. Specifically, FEV1 is 2.1 L/80%. FVC 3.1 L/73%. Bronchodilator response FEV1 is 9%. Small airways is 60%. Total lung capacity is 83%. DLCO is 85%.    Dr Lorenza Cambridge Reflux Symptom Index (> 13-15 suggestive of LPR cough) 0 -> 5  =  none ->severe problem 08/18/2012   Hoarseness of problem with voice 5  Clearing  Of Throat 5  Excess throat mucus or feeling of post nasal drip 0  Difficulty swallowing food, liquid or tablets 0  Cough after eating or lying down 2  Breathing difficulties or choking episodes 0  Troublesome or annoying cough 4  Sensation of something sticking in throat or lump in throat 0   Heartburn, chest pain, indigestion, or stomach acid coming up 0  TOTAL 16    Review of Systems  Constitutional: Negative for appetite change and unexpected weight change.  HENT: Positive for congestion. Negative for ear pain, sore throat, sneezing, trouble swallowing and dental problem.   Respiratory: Positive for cough and shortness of breath.   Cardiovascular: Negative for chest pain, palpitations and leg swelling.  Gastrointestinal: Negative for abdominal pain.  Musculoskeletal: Negative for joint swelling.  Skin: Negative for rash.  Psychiatric/Behavioral: Negative for dysphoric mood. The patient is not nervous/anxious.        Objective:   Physical Exam  Nursing note and vitals reviewed. Constitutional: He is oriented to person, place, and time. He appears well-developed and well-nourished. No distress.  Body mass index is 33.07 kg/(m^2).   HENT:  Head: Normocephalic and atraumatic.  Right Ear: External ear normal.  Left Ear: External ear normal.  Mouth/Throat: Oropharynx is clear and moist. No oropharyngeal exudate.  Eyes: Conjunctivae and EOM are normal. Pupils are equal, round, and reactive to light. Right eye exhibits no discharge. Left eye exhibits no discharge. No scleral icterus.  Post nasal drip present  Neck: Normal range of motion. Neck supple. No JVD present. No tracheal deviation present. No thyromegaly present.  Cardiovascular: Normal rate, regular rhythm and intact distal pulses.  Exam reveals no gallop and no friction rub.   No murmur heard. Pulmonary/Chest: Effort normal. No respiratory distress. He  has no wheezes. He has rales. He exhibits no tenderness.  Rales in leftbase  Abdominal: Soft. Bowel sounds are normal. He exhibits no distension and no mass. There is no tenderness. There is no rebound and no guarding.  Musculoskeletal: Normal range of motion. He exhibits no edema and no tenderness.  Lymphadenopathy:    He has no cervical adenopathy.   Neurological: He is alert and oriented to person, place, and time. He has normal reflexes. No cranial nerve deficit. Coordination normal.  Skin: Skin is warm and dry. No rash noted. He is not diaphoretic. No erythema. No pallor.  Psychiatric: He has a normal mood and affect. His behavior is normal. Judgment and thought content normal.          Assessment & Plan:

## 2012-08-25 NOTE — Progress Notes (Signed)
Dr. Brand Males, M.D., [facility].C.P Pulmonary and Critical Care Medicine Staff Physician [place] [facility] Pager: [phone], If no answer or between  15:00h - 7:00h: call [phone]  08/25/2012 5:42 PM

## 2012-08-26 ENCOUNTER — Encounter (HOSPITAL_COMMUNITY): Payer: Self-pay | Admitting: Internal Medicine

## 2012-08-29 ENCOUNTER — Ambulatory Visit (INDEPENDENT_AMBULATORY_CARE_PROVIDER_SITE_OTHER): Payer: Medicare PPO | Admitting: Internal Medicine

## 2012-08-29 ENCOUNTER — Encounter: Payer: Self-pay | Admitting: Internal Medicine

## 2012-08-29 VITALS — BP 144/78 | HR 107 | Ht 72.0 in | Wt 243.0 lb

## 2012-08-29 DIAGNOSIS — R053 Chronic cough: Secondary | ICD-10-CM

## 2012-08-29 DIAGNOSIS — C3490 Malignant neoplasm of unspecified part of unspecified bronchus or lung: Secondary | ICD-10-CM

## 2012-08-29 DIAGNOSIS — R0902 Hypoxemia: Secondary | ICD-10-CM

## 2012-08-29 DIAGNOSIS — G4730 Sleep apnea, unspecified: Secondary | ICD-10-CM

## 2012-08-29 DIAGNOSIS — R059 Cough, unspecified: Secondary | ICD-10-CM

## 2012-08-29 DIAGNOSIS — J387 Other diseases of larynx: Secondary | ICD-10-CM

## 2012-08-29 MED ORDER — BECLOMETHASONE DIPROPIONATE 80 MCG/ACT IN AERS: 2.0000 | INHALATION_SPRAY | Freq: Two times a day (BID) | RESPIRATORY_TRACT | Status: DC

## 2012-08-29 MED ORDER — GABAPENTIN 300 MG PO CAPS: ORAL_CAPSULE | ORAL | Status: DC

## 2012-08-29 MED ORDER — FLUTICASONE PROPIONATE 50 MCG/ACT NA SUSP: 2.0000 | Freq: Every day | NASAL | Status: DC

## 2012-08-29 NOTE — Progress Notes (Signed)
Subjective:    Patient ID: Clayton [name], male    DOB: Feb 18, 1928, 79 y.o.   MRN: [id number]  HPI HPI #Followup  - Influenza related acute ventilatory respiratory failure on the ventilator and post influenza MRSA pneumonia - January 2014 - Non-small cell lung cancer detected on an endobronchial mucous plug during acute ICU stay January 2014 - Chronic cough that predates above   OV 08/18/2012 - In terms of post ICU acute respiratory failure and critical illness. He is back to normal functional status and is looking for driving an attending the [facility]  - In terms of "lung cancer" - get a PET scan last week. I personally review this. On the lung cut his left lower lobe infiltrate that looks like a scar. However this no abnormal uptake on the PET scan such as lung cancer anywhwew  - In terms of chronic cough: This persists unchanged . He saw Clayton. Legrand [name] wert recently for chronic cough and was started on proton pump inhibitor twice a day but this has not helped his chronic cough.. his RSI cough score is 18 and suggestive of irritable larynx syndrome. His cough differentiated score is 0 for acid reflux and 2 for neurogenic or airway related cough. His pulmonary function test today is essentially normal except for reduced forced vital capacity, 9% bronchodilator response with FEV1 and in reduced small airway function. Specifically, FEV1 is 2.1 L/80%. FVC 3.1 L/73%. Bronchodilator response FEV1 is 9%. Small airways is 60%. Total lung capacity is 83%. DLCO is 85%.      REC  #Possible lung cancer - We will do video flexible bronchoscopy on 08/25/2012 Monday at [place] outpatient endoscopy suite sometime in the morning - Please wait to hear from our schedulers with time I have discussed the risks and benefits and limitations of the procedure  #Mild obstructive lung disease - Continue your duo neb at least 2 times a day if not 4 times a day - Start Qvar 80 mcg 2 puff 2 times a day  - My  medical assistant will do to the inhaler technique  #Chronic cough - This will be addressed after your bronchoscopy during followup visit  #Followup - One week after bronchoscopy   OV 08/29/2012 Bronchoscopy done 08/25/2012: Normal airway exam. Random biopsies from left secondary carina leading to the left upper lobe was normal mucosa. He and daughter Clayton [name] returns for followup for further discussions.  I discussed his jan 2014 pathology with Clayton Clayton [name] pathologist; necrotic tissue with atypia coonsistent with cancer. However ,currently no cancer detected. Therefore, origiinal jan 2014 slide will be sent out to Clayton Clayton [name] for second opinion.   In the interim the cough is no different. He is interested in discontinuing his oxygen that he takes with his CPAP    Clayton Clayton [name] Reflux Symptom Index (> 13-15 suggestive of LPR cough) 0 -> 5  =  none ->severe problem 08/18/2012   Hoarseness of problem with voice 5  Clearing  Of Throat 5  Excess throat mucus or feeling of post nasal drip 0  Difficulty swallowing food, liquid or tablets 0  Cough after eating or lying down 2  Breathing difficulties or choking episodes 0  Troublesome or annoying cough 4  Sensation of something sticking in throat or lump in throat 0  Heartburn, chest pain, indigestion, or stomach acid coming up 0  TOTAL 16    Review of Systems  Constitutional: Negative for fever and unexpected weight change.  HENT: Negative  for ear pain, nosebleeds, congestion, sore throat, rhinorrhea, sneezing, trouble swallowing, dental problem, postnasal drip and sinus pressure.   Eyes: Negative for redness and itching.  Respiratory: Positive for cough. Negative for chest tightness, shortness of breath and wheezing.   Cardiovascular: Negative for palpitations and leg swelling.  Gastrointestinal: Negative for nausea and vomiting.  Genitourinary: Negative for dysuria.  Musculoskeletal: Positive for myalgias and arthralgias. Negative for  joint swelling.  Skin: Negative for rash.  Neurological: Negative for headaches.  Hematological: Does not bruise/bleed easily.  Psychiatric/Behavioral: Negative for dysphoric mood. The patient is not nervous/anxious.    Current outpatient prescriptions:albuterol (PROVENTIL HFA;VENTOLIN HFA) 108 (90 BASE) MCG/ACT inhaler, Inhale 2 puffs into the lungs every 4 (four) hours as needed for wheezing or shortness of breath., Disp: , Rfl: ;  albuterol (PROVENTIL) (5 MG/ML) 0.5% nebulizer solution, Take 2.5 mg by nebulization every 4 (four) hours as needed for wheezing., Disp: , Rfl:  famotidine (PEPCID) 10 MG tablet, Take 10 mg by mouth 2 (two) times daily., Disp: , Rfl: ;  fesoterodine (TOVIAZ) 8 MG TB24, Take 8 mg by mouth daily., Disp: , Rfl: ;  finasteride (PROSCAR) 5 MG tablet, Take 5 mg by mouth daily., Disp: , Rfl: ;  furosemide (LASIX) 20 MG tablet, Take 20 mg by mouth. 1 tablet every other day, Disp: , Rfl: ;  insulin glargine (LANTUS) 100 UNIT/ML injection, Inject 26 Units into the skin daily. , Disp: , Rfl:  insulin lispro (HUMALOG) 100 UNIT/ML injection, Inject 12 Units into the skin 3 (three) times daily before meals. 1 unit for every 50 over 100, Disp: , Rfl: ;  ipratropium-albuterol (DUONEB) 0.5-2.5 (3) MG/3ML SOLN, Take 3 mLs by nebulization every 4 (four) hours as needed. PRN DX: 799.02, 162.9, Disp: , Rfl: ;  mirabegron ER (MYRBETRIQ) 25 MG TB24, Take 25 mg by mouth at bedtime., Disp: , Rfl:  omeprazole (PRILOSEC OTC) 20 MG tablet, Take 20 mg by mouth daily., Disp: , Rfl: ;  pioglitazone (ACTOS) 45 MG tablet, Take 45 mg by mouth. Every other day, Disp: , Rfl: ;  Polysacch Fe Cmp-Fe Heme Poly (BIFERA) 28 MG TABS, Take 1 tablet by mouth daily., Disp: , Rfl: ;  simvastatin (ZOCOR) 20 MG tablet, Take 20 mg by mouth every evening., Disp: , Rfl: ;  zolpidem (AMBIEN) 5 MG tablet, Take 5 mg by mouth at bedtime. For insomnia, Disp: , Rfl:      Objective:   Physical Exam   Discussion only visit. He  is alert and oriented and has normal mood     Assessment & Plan:

## 2012-08-29 NOTE — Patient Instructions (Addendum)
#  Sleep apnea  - at your request will test ONO on Room air off cpap  #Lung cancer - No evidence of lung cancer as of March 2014 - Await second opinion from pathologist in [place] - Depending on second opinion of pathologist we'll decide on timing of serial CAT scan of the chest  #Chronic cough Cough is from sinus drainage, acid reflux, possible asthma,  All of this is working together to cause cyclical cough/LPR cough otherwise for irritable larynx  #Sinus drainage  - start/continue netti pot daily  or 3% hypertonic saline nasal spray - start nasal steroid generic fluticasone inhaler 2 squirts each nostril daily as advised (nurse will do script)  #Possible Acid Reflux  - take otc zegerid 20mg   1 capsule daily on empty stomach    -- avoid colas, spices, cheeses, spirits, red meats, beer, chocolates, fried foods etc.,   - sleep with head end of bed elevated  - eat small frequent meals  - do not go to bed for 3 hours after last meal  #Possible Asthma  - Continue DuoNeb 2-4 times daily as scheduled - Continuet QVAR 47mcg 2 puff twice daily - take samples and script  and show inhaler technique  #Cyclical cough or irritable larynx - Take gabapentin 300mg  once daily x 3 days, then 300mg  twice daily x 3 days, then 300mg  three times daily to continue. If this makes you too sleepy or drowsy call us and we will cut your medication dosing down - I will refer you to speech therapy neuro rehabilitation  - at all times there  there is urge to cough, drink water or swallow or sip on throat lozenge  #Followup - I will see you in 6 weeks.  - Cough score at followup - any problems call or come sooner

## 2012-08-30 NOTE — Assessment & Plan Note (Signed)
He wants to dc o2 when he uses cpap. Will test ono on cpap but off oxygen and if normal, will dc o2

## 2012-08-30 NOTE — Assessment & Plan Note (Signed)
Jan 2014 - bronch done to faciliate weaning when acutely ill with flu and mrsa random free floating endobronchial tissue of unknown site  showed NSCLC along with inflamed airway Feb 2014: normal PET scan March 2014: normal bronch exam  No evidence of lung cancer. Original jan 2014 path will be sent to [facility] for 2nd opinion to Dr Dian Situ. We will probably end up adopting a serial CT scan approach  > 50% of this > 25 min visit spent in face to face counseling (15 min visit converted to 25 min)

## 2012-08-30 NOTE — Assessment & Plan Note (Addendum)
#  Chronic cough Cough is from sinus drainage, acid reflux, possible asthma,  All of this is working together to cause cyclical cough/LPR cough otherwise for irritable larynx  #Sinus drainage  - start/continue netti pot daily  or 3% hypertonic saline nasal spray - start nasal steroid generic fluticasone inhaler 2 squirts each nostril daily as advised (nurse will do script)  #Possible Acid Reflux  - take otc zegerid 20mg   1 capsule daily on empty stomach    -- avoid colas, spices, cheeses, spirits, red meats, beer, chocolates, fried foods etc.,   - sleep with head end of bed elevated  - eat small frequent meals  - do not go to bed for 3 hours after last meal  #Possible Asthma  - Continue DuoNeb 2-4 times daily as scheduled - Continuet QVAR 41mcg 2 puff twice daily - take samples and script  and show inhaler technique  #Cyclical cough or irritable larynx - Take gabapentin 300mg  once daily x 3 days, then 300mg  twice daily x 3 days, then 300mg  three times daily to continue. If this makes you too sleepy or drowsy call us and we will cut your medication dosing down - I will refer you to speech therapy neuro rehabilitation  - at all times there  there is urge to cough, drink water or swallow or sip on throat lozenge  #Followup - I will see you in 6 weeks.  - Cough score at followup - any problems call or come sooner   > 50% of this > 25 min visit spent in face to face counseling (15 min visit converted to 25 min)

## 2012-09-04 ENCOUNTER — Ambulatory Visit: Payer: Medicare PPO | Attending: Internal Medicine

## 2012-09-04 DIAGNOSIS — IMO0001 Reserved for inherently not codable concepts without codable children: Secondary | ICD-10-CM | POA: Insufficient documentation

## 2012-09-04 DIAGNOSIS — R498 Other voice and resonance disorders: Secondary | ICD-10-CM | POA: Insufficient documentation

## 2012-09-10 ENCOUNTER — Ambulatory Visit: Payer: Medicare PPO | Admitting: Speech Pathology

## 2012-09-15 ENCOUNTER — Telehealth: Payer: Self-pay | Admitting: Internal Medicine

## 2012-09-15 DIAGNOSIS — R0902 Hypoxemia: Secondary | ICD-10-CM

## 2012-09-15 NOTE — Telephone Encounter (Signed)
Revuewed ono daed 09/06/12 - 10 minutes total time 3% of sleep  Time below 90% but only 24 seconds below 88%. So, if he does not want O2, please dc it   Dr. Brand Males, M.D., [facility].C.P Pulmonary and Critical Care Medicine Staff Physician [place] [facility] Pager: [phone], If no answer or between  15:00h - 7:00h: call [phone]  09/15/2012 12:03 PM

## 2012-09-15 NOTE — Telephone Encounter (Signed)
I spoke with pt and aware of results. Pt would like to d/c the oxygen. Order has been sent.

## 2012-09-17 ENCOUNTER — Ambulatory Visit: Payer: Medicare PPO | Attending: Internal Medicine | Admitting: Speech Pathology

## 2012-09-17 DIAGNOSIS — IMO0001 Reserved for inherently not codable concepts without codable children: Secondary | ICD-10-CM | POA: Insufficient documentation

## 2012-09-17 DIAGNOSIS — R498 Other voice and resonance disorders: Secondary | ICD-10-CM | POA: Insufficient documentation

## 2012-09-22 ENCOUNTER — Other Ambulatory Visit (HOSPITAL_COMMUNITY)
Admission: RE | Admit: 2012-09-22 | Discharge: 2012-09-22 | Disposition: A | Discharge status: Home / Self Care | Payer: Medicare PPO | Source: Ambulatory Visit | Attending: Internal Medicine | Admitting: Internal Medicine

## 2012-09-22 DIAGNOSIS — R05 Cough: Secondary | ICD-10-CM | POA: Insufficient documentation

## 2012-09-22 DIAGNOSIS — R059 Cough, unspecified: Secondary | ICD-10-CM | POA: Insufficient documentation

## 2012-09-22 DIAGNOSIS — C3490 Malignant neoplasm of unspecified part of unspecified bronchus or lung: Secondary | ICD-10-CM | POA: Insufficient documentation

## 2012-09-24 ENCOUNTER — Ambulatory Visit: Payer: Medicare PPO | Admitting: Speech Pathology

## 2012-10-01 ENCOUNTER — Encounter: Payer: Self-pay | Admitting: Internal Medicine

## 2012-10-13 ENCOUNTER — Encounter: Payer: Self-pay | Admitting: Internal Medicine

## 2012-10-13 ENCOUNTER — Ambulatory Visit (INDEPENDENT_AMBULATORY_CARE_PROVIDER_SITE_OTHER): Payer: Medicare PPO | Admitting: Internal Medicine

## 2012-10-13 VITALS — BP 120/72 | HR 78 | Temp 98.2°F | Ht 72.0 in | Wt 246.2 lb

## 2012-10-13 DIAGNOSIS — R05 Cough: Secondary | ICD-10-CM

## 2012-10-13 DIAGNOSIS — R053 Chronic cough: Secondary | ICD-10-CM

## 2012-10-13 DIAGNOSIS — R059 Cough, unspecified: Secondary | ICD-10-CM

## 2012-10-13 MED ORDER — AZELASTINE HCL 0.15 % NA SOLN: 2.0000 | Freq: Every day | NASAL | Status: DC

## 2012-10-13 MED ORDER — GABAPENTIN 300 MG PO CAPS: 300.0000 mg | ORAL_CAPSULE | Freq: Three times a day (TID) | ORAL | Status: DC

## 2012-10-13 NOTE — Patient Instructions (Addendum)
#  Sleep apnea  -Room air cpap to continue  #Lung cancer - No evidence of lung cancer as of April 2014 - Pathologist in [place] says likely reaction to acute infection - Followup CT chest 1 year   #Chronic cough Cough is from sinus drainage, possible acid reflux, possible asthma,  All of this is working together to cause cyclical cough/LPR cough otherwise for irritable larynx  #Sinus drainage  - continue netti pot daily  or 3% hypertonic saline nasal spray -  Continue nasal steroid generic fluticasone inhaler 2 squirts each nostril daily as advised  - start asteprol nasal inhaler day time 2 squirts - ENT consult depending on course  #Possible Acid Reflux  - Ok to stop  zegerid 20mg    - BUT stop all drinks except water - Aoid colas, spices, cheeses, spirits, red meats, beer, chocolates, fried foods etc.,   - sleep with head end of bed elevated  - eat small frequent meals  - do not go to bed for 3 hours after last meal  #Possible Asthma  - Continue DuoNeb 2-4 times daily as scheduled - Continuet QVAR 2mcg 2 puff twice daily   #Cyclical cough or irritable larynx - Continue gabapentin 300mg  othree times daily to continue.  - take 3 days and do complete voice rest  - at all times there  there is urge to cough, drink water or swallow or sip on throat lozenge  #Followup - I will see you in 6 weeks.  - Cough score at followup - any problems call or come sooner

## 2012-10-13 NOTE — Progress Notes (Signed)
Subjective:    Patient ID: Clayton [name], male    DOB: [birth year], 79 y.o.   MRN: [id number]  HPI #Followup  - Influenza related acute ventilatory respiratory failure on the ventilator and post influenza MRSA pneumonia - January 2014  - False positive Non-small cell lung cancer detected on an endobronchial mucous plug during acute ICU stay January 2014  - deemed false positive based on - PET scan March 2014 and normal bronch March 2014 and 2nd opinion Jan 2014 path by DR Dian Situ  - Chronic cough that predates above  - PFT 09/02/12: His pulmonary function test today is essentially normal except for reduced forced vital capacity, 9% bronchodilator response with FEV1 and in reduced small airway function. Specifically, FEV1 is 2.1 L/80%. FVC 3.1 L/73%. Bronchodilator response FEV1 is 9%. Small airways is 60%. Total lung capacity is 83%. DLCO is 85%.  - March 2014: Normal bronchoscopy     OV 10/13/2012  Follow for chronic cough.  -At last visit on 08/29/2012 started him on a 2 part, nasal steroid, over-the-counter proton pump inhibitor and asked him to continue on DuoNeb and Qvar. In addition started gabapentin and referred him to speech therapy. With these interventions cough is on 50% better and RSI cough score is reduced from 16 to 9. However, he and his [name] are not happy with the level of improvement. Diffusing quality of life is impaired because he's unable to attend occasional social functions and they feel cough is a social embarrassment. They admit to significant gagging and tearing of the throat. Of note, he did and speech therapy for classes but since has been discharged from followup  - In terms of the supposed to lung cancer got an e-mail from Dr. Dian Situ who feels that his pathology but did false positive atypia and that she is seen cases like this and patient to be acutely ill.   Dr Lorenza Cambridge Reflux Symptom Index (> 13-15 suggestive of LPR cough) 0 -> 5   08/18/2012  10/13/2012   Hoarseness of problem with voice 5 3  Clearing  Of Throat 5 3  Excess throat mucus or feeling of post nasal drip 0 0  Difficulty swallowing food, liquid or tablets 0 0  Cough after eating or lying down 2 0  Breathing difficulties or choking episodes 0 0  Troublesome or annoying cough 4 3  Sensation of something sticking in throat or lump in throat 0 0  Heartburn, chest pain, indigestion, or stomach acid coming up 0 0  TOTAL 16 9    Past, Family, Social reviewed: no change since last visit    Review of Systems  Constitutional: Negative for fever and unexpected weight change.  HENT: Negative for ear pain, nosebleeds, congestion, sore throat, rhinorrhea, sneezing, trouble swallowing, dental problem, postnasal drip and sinus pressure.   Eyes: Negative for redness and itching.  Respiratory: Positive for cough and shortness of breath. Negative for chest tightness and wheezing.   Cardiovascular: Negative for palpitations and leg swelling.  Gastrointestinal: Negative for nausea and vomiting.  Genitourinary: Negative for dysuria.  Musculoskeletal: Negative for joint swelling.  Skin: Negative for rash.  Neurological: Negative for headaches.  Hematological: Does not bruise/bleed easily.  Psychiatric/Behavioral: Negative for dysphoric mood. The patient is not nervous/anxious.    Current outpatient prescriptions:albuterol (PROVENTIL HFA;VENTOLIN HFA) 108 (90 BASE) MCG/ACT inhaler, Inhale 2 puffs into the lungs every 4 (four) hours as needed for wheezing or shortness of breath., Disp: , Rfl: ;  albuterol (PROVENTIL) (5  MG/ML) 0.5% nebulizer solution, Take 2.5 mg by nebulization every 4 (four) hours as needed for wheezing., Disp: , Rfl:  beclomethasone (QVAR) 80 MCG/ACT inhaler, Inhale 2 puffs into the lungs 2 (two) times daily., Disp: 1 Inhaler, Rfl: 2;  famotidine (PEPCID) 10 MG tablet, Take 10 mg by mouth 2 (two) times daily., Disp: , Rfl: ;  fesoterodine (TOVIAZ) 8 MG  TB24, Take 8 mg by mouth daily., Disp: , Rfl: ;  finasteride (PROSCAR) 5 MG tablet, Take 5 mg by mouth daily., Disp: , Rfl:  fluticasone (FLONASE) 50 MCG/ACT nasal spray, Place 2 sprays into the nose daily., Disp: 16 g, Rfl: 2;  furosemide (LASIX) 20 MG tablet, Take 20 mg by mouth daily. , Disp: , Rfl: ;  gabapentin (NEURONTIN) 300 MG capsule, Take 1 capsule (300 mg total) by mouth 3 (three) times daily., Disp: 90 capsule, Rfl: 6;  insulin glargine (LANTUS) 100 UNIT/ML injection, Inject 26 Units into the skin daily. , Disp: , Rfl:  insulin lispro (HUMALOG) 100 UNIT/ML injection, Inject 12 Units into the skin 3 (three) times daily before meals. 1 unit for every 50 over 100, Disp: , Rfl: ;  ipratropium-albuterol (DUONEB) 0.5-2.5 (3) MG/3ML SOLN, Take 3 mLs by nebulization every 4 (four) hours as needed. PRN DX: 799.02, 162.9, Disp: , Rfl: ;  mirabegron ER (MYRBETRIQ) 25 MG TB24, Take 25 mg by mouth at bedtime., Disp: , Rfl:  omeprazole (PRILOSEC OTC) 20 MG tablet, Take 20 mg by mouth daily., Disp: , Rfl: ;  pioglitazone (ACTOS) 45 MG tablet, Take 45 mg by mouth. Every other day, Disp: , Rfl: ;  Polysacch Fe Cmp-Fe Heme Poly (BIFERA) 28 MG TABS, Take 1 tablet by mouth daily., Disp: , Rfl: ;  simvastatin (ZOCOR) 20 MG tablet, Take 20 mg by mouth every evening., Disp: , Rfl: ;  zolpidem (AMBIEN) 5 MG tablet, Take 5 mg by mouth at bedtime. For insomnia, Disp: , Rfl:  Azelastine HCl (ASTEPRO) 0.15 % SOLN, Place 2 sprays into the nose daily., Disp: 30 mL, Rfl: 6     Objective:   Physical Exam  Nursing note and vitals reviewed. Constitutional: He is oriented to person, place, and time. He appears well-developed and well-nourished. No distress.  HENT:  Head: Normocephalic and atraumatic.  Right Ear: External ear normal.  Left Ear: External ear normal.  Mouth/Throat: Oropharynx is clear and moist. No oropharyngeal exudate.  Postnasal drainage. The patient gagging of the throat and clearing  Eyes: Conjunctivae  and EOM are normal. Pupils are equal, round, and reactive to light. Right eye exhibits no discharge. Left eye exhibits no discharge. No scleral icterus.  Neck: Normal range of motion. Neck supple. No JVD present. No tracheal deviation present. No thyromegaly present.  Cardiovascular: Normal rate, regular rhythm and intact distal pulses.  Exam reveals no gallop and no friction rub.   No murmur heard. Pulmonary/Chest: Effort normal and breath sounds normal. No respiratory distress. He has no wheezes. He has no rales. He exhibits no tenderness.  Abdominal: Soft. Bowel sounds are normal. He exhibits no distension and no mass. There is no tenderness. There is no rebound and no guarding.  Musculoskeletal: Normal range of motion. He exhibits no edema and no tenderness.  Lymphadenopathy:    He has no cervical adenopathy.  Neurological: He is alert and oriented to person, place, and time. He has normal reflexes. No cranial nerve deficit. Coordination normal.  Skin: Skin is warm and dry. No rash noted. He is not diaphoretic. No  erythema. No pallor.  Psychiatric: He has a normal mood and affect. His behavior is normal. Judgment and thought content normal.          Assessment & Plan:

## 2012-10-13 NOTE — Assessment & Plan Note (Signed)
#  Chronic cough Cough is from sinus drainage, possible acid reflux, possible asthma,  All of this is working together to cause cyclical cough/LPR cough otherwise for irritable larynx  #Sinus drainage  - continue netti pot daily  or 3% hypertonic saline nasal spray -  Continue nasal steroid generic fluticasone inhaler 2 squirts each nostril daily as advised  - start asteprol nasal inhaler day time 2 squirts - ENT consult depending on course  #Possible Acid Reflux  - Ok to stop  zegerid 20mg    - BUT stop all drinks except water - Aoid colas, spices, cheeses, spirits, red meats, beer, chocolates, fried foods etc.,   - sleep with head end of bed elevated  - eat small frequent meals  - do not go to bed for 3 hours after last meal  #Possible Asthma  - Continue DuoNeb 2-4 times daily as scheduled - Continuet QVAR 6mcg 2 puff twice daily   #Cyclical cough or irritable larynx - Continue gabapentin 300mg  othree times daily to continue.  - take 3 days and do complete voice rest  - at all times there  there is urge to cough, drink water or swallow or sip on throat lozenge  #Followup - I will see you in 6 weeks.  - Cough score at followup - any problems call or come sooner   > 50% of this > 25 min visit spent in face to face counseling (15 min visit converted to 25 min)

## 2012-11-11 ENCOUNTER — Telehealth: Payer: Self-pay | Admitting: Pediatrics

## 2012-11-11 MED ORDER — BECLOMETHASONE DIPROPIONATE 80 MCG/ACT IN AERS: 2.0000 | INHALATION_SPRAY | Freq: Two times a day (BID) | RESPIRATORY_TRACT | Status: DC

## 2012-11-11 NOTE — Telephone Encounter (Signed)
Refill sent and pt is aware. Percy Comp, CMA  

## 2012-12-04 ENCOUNTER — Other Ambulatory Visit: Payer: Self-pay | Admitting: Family Medicine

## 2012-12-04 DIAGNOSIS — N644 Mastodynia: Secondary | ICD-10-CM

## 2012-12-10 ENCOUNTER — Encounter: Payer: Self-pay | Admitting: Internal Medicine

## 2012-12-10 ENCOUNTER — Ambulatory Visit (INDEPENDENT_AMBULATORY_CARE_PROVIDER_SITE_OTHER): Payer: Medicare PPO | Admitting: Internal Medicine

## 2012-12-10 VITALS — BP 130/76 | HR 86 | Temp 97.8°F | Ht 72.0 in | Wt 241.0 lb

## 2012-12-10 DIAGNOSIS — H9190 Unspecified hearing loss, unspecified ear: Secondary | ICD-10-CM

## 2012-12-10 DIAGNOSIS — R05 Cough: Secondary | ICD-10-CM

## 2012-12-10 DIAGNOSIS — Z8614 Personal history of Methicillin resistant Staphylococcus aureus infection: Secondary | ICD-10-CM

## 2012-12-10 DIAGNOSIS — R053 Chronic cough: Secondary | ICD-10-CM

## 2012-12-10 DIAGNOSIS — R059 Cough, unspecified: Secondary | ICD-10-CM

## 2012-12-10 MED ORDER — DOXYCYCLINE HYCLATE 100 MG PO TABS: 100.0000 mg | ORAL_TABLET | Freq: Two times a day (BID) | ORAL | Status: DC

## 2012-12-10 MED ORDER — PREDNISONE 10 MG PO TABS: ORAL_TABLET | ORAL | Status: DC

## 2012-12-10 NOTE — Progress Notes (Signed)
Subjective:    Patient ID: [name], male    DOB: 01/14/1928, 79 y.o.   MRN: [id number]  HPI PCP Clayton Amel, MD   HPI #Followup  - Influenza related acute ventilatory respiratory failure on the ventilator and post influenza MRSA pneumonia - January 2014  - False positive Non-small cell lung cancer detected on an endobronchial mucous plug during acute ICU stay January 2014  - deemed false positive based on - PET scan March 2014 and normal bronch March 2014 and 2nd opinion Jan 2014 path by DR Clayton [name]  - Chronic cough that predates above  - PFT 09/02/12: His pulmonary function test today is essentially normal except for reduced forced vital capacity, 9% bronchodilator response with FEV1 and in reduced small airway function. Specifically, FEV1 is 2.1 L/80%. FVC 3.1 L/73%. Bronchodilator response FEV1 is 9%. Small airways is 60%. Total lung capacity is 83%. DLCO is 85%.  - March 2014: Normal bronchoscopy     OV 10/13/2012  Follow for chronic cough.  -At last visit on 08/29/2012 started him on a 2 part, nasal steroid, over-the-counter proton pump inhibitor and asked him to continue on DuoNeb and Qvar. In addition started gabapentin and referred him to speech therapy. With these interventions cough is on 50% better and RSI cough score is reduced from 16 to 9. However, he and his daughter Clayton [name] are not happy with the level of improvement. Diffusing quality of life is impaired because he's unable to attend occasional social functions and they feel cough is a social embarrassment. They admit to significant gagging and tearing of the throat. Of note, he did and speech therapy for classes but since has been discharged from followup  - In terms of the supposed to lung cancer got an e-mail from Dr. Dian [name] who feels that his pathology but did false positive atypia and that she is seen cases like this and patient to be acutely ill.  REC #Sleep apnea  -Room air cpap to  continue  #Lung cancer - No evidence of lung cancer as of April 2014 - Pathologist in [place] says likely reaction to acute infection - Followup CT chest 1 year   #Chronic cough Cough is from sinus drainage, possible acid reflux, possible asthma,  All of this is working together to cause cyclical cough/LPR cough otherwise for irritable larynx  #Sinus drainage  - continue netti pot daily  or 3% hypertonic saline nasal spray -  Continue nasal steroid generic fluticasone inhaler 2 squirts each nostril daily as advised  - start asteprol nasal inhaler day time 2 squirts - ENT consult depending on course  #Possible Acid Reflux  - Ok to stop  zegerid 20mg    - BUT stop all drinks except water - Aoid colas, spices, cheeses, spirits, red meats, beer, chocolates, fried foods etc.,   - sleep with head end of bed elevated  - eat small frequent meals  - do not go to bed for 3 hours after last meal  #Possible Asthma  - Continue DuoNeb 2-4 times daily as scheduled - Continuet QVAR 60mcg 2 puff twice daily   #Cyclical cough or irritable larynx - Continue gabapentin 300mg  othree times daily to continue.  - take 3 days and do complete voice rest  - at all times there  there is urge to cough, drink water or swallow or sip on throat lozenge  #Followup - I will see you in 6 weeks.  - Cough score at followup - any problems call or come  sooner   OV 12/10/2012 FU chronic cough  - says chronic cough is unresolved. Stable though. Not worse. Not better. This is despite giving trials of quitting cheese, coffee. Gabpentin not helping.  Cough still associted iwht chronic throat clearing. However, cough has changed acutely. One daughter started coughing acutely 5 days ago. Then his cough changed 3-4 days ago. Now the cough is like a "regul;ar cough". Sputum is yellow and new (baseline dry cough). INsidious onset. Sick contact +. No associated wheeze or dyspnea  Past, Family, Social reviewed: no change  since last visit. Other than: Overall. Rides bike daily on the road - recumbent trike. Bikes 14 miles daily but will stip after first mile.    Dr Clayton [name] Reflux Symptom Index (> 13-15 suggestive of LPR cough) 0 -> 5  08/18/2012  10/13/2012  12/10/2012 Acute change x 4 days  Hoarseness of problem with voice 5 3 3   Clearing  Of Throat 5 3 3   Excess throat mucus or feeling of post nasal drip 0 0 4  Difficulty swallowing food, liquid or tablets 0 0 0  Cough after eating or lying down 2 0 0  Breathing difficulties or choking episodes 0 0 0  Troublesome or annoying cough 4 3 3   Sensation of something sticking in throat or lump in throat 0 0 1  Heartburn, chest pain, indigestion, or stomach acid coming up 0 0 0  TOTAL 16 9 14     Past, Family, Social reviewed: no change since last visit   Review of Systems  Constitutional: Negative for appetite change and unexpected weight change.  HENT: Negative for ear pain, congestion, sore throat, sneezing, trouble swallowing and dental problem.   Respiratory: Positive for cough. Negative for chest tightness, shortness of breath and wheezing.   Cardiovascular: Negative for chest pain, palpitations and leg swelling.  Gastrointestinal: Negative for abdominal pain.  Musculoskeletal: Negative for back pain.  Skin: Negative for rash.  Neurological: Negative for headaches.  Psychiatric/Behavioral: Negative for decreased concentration. The patient is not nervous/anxious.    Current outpatient prescriptions:albuterol (PROVENTIL HFA;VENTOLIN HFA) 108 (90 BASE) MCG/ACT inhaler, Inhale 2 puffs into the lungs every 4 (four) hours as needed for wheezing or shortness of breath., Disp: , Rfl: ;  albuterol (PROVENTIL) (5 MG/ML) 0.5% nebulizer solution, Take 2.5 mg by nebulization every 4 (four) hours as needed for wheezing., Disp: , Rfl:  Azelastine HCl (ASTEPRO) 0.15 % SOLN, Place 2 sprays into the nose daily., Disp: 30 mL, Rfl: 6;  beclomethasone (QVAR) 80 MCG/ACT  inhaler, Inhale 2 puffs into the lungs 2 (two) times daily., Disp: 1 Inhaler, Rfl: 6;  famotidine (PEPCID) 10 MG tablet, Take 10 mg by mouth 2 (two) times daily., Disp: , Rfl: ;  fesoterodine (TOVIAZ) 8 MG TB24, Take 8 mg by mouth daily., Disp: , Rfl:  finasteride (PROSCAR) 5 MG tablet, Take 5 mg by mouth daily., Disp: , Rfl: ;  fluticasone (FLONASE) 50 MCG/ACT nasal spray, Place 2 sprays into the nose daily., Disp: 16 g, Rfl: 2;  furosemide (LASIX) 20 MG tablet, Take 20 mg by mouth daily. , Disp: , Rfl: ;  gabapentin (NEURONTIN) 300 MG capsule, Take 1 capsule (300 mg total) by mouth 3 (three) times daily., Disp: 90 capsule, Rfl: 6 insulin glargine (LANTUS) 100 UNIT/ML injection, Inject 26 Units into the skin daily. , Disp: , Rfl: ;  insulin lispro (HUMALOG) 100 UNIT/ML injection, Inject 12 Units into the skin 3 (three) times daily before meals. 1 unit for every 50  over 100, Disp: , Rfl: ;  ipratropium-albuterol (DUONEB) 0.5-2.5 (3) MG/3ML SOLN, Take 3 mLs by nebulization every 4 (four) hours as needed. PRN DX: 799.02, 162.9, Disp: , Rfl:  mirabegron ER (MYRBETRIQ) 25 MG TB24, Take 25 mg by mouth at bedtime., Disp: , Rfl: ;  omeprazole (PRILOSEC OTC) 20 MG tablet, Take 20 mg by mouth daily., Disp: , Rfl: ;  pioglitazone (ACTOS) 45 MG tablet, Take 45 mg by mouth. Every other day, Disp: , Rfl: ;  Polysacch Fe Cmp-Fe Heme Poly (BIFERA) 28 MG TABS, Take 1 tablet by mouth daily., Disp: , Rfl: ;  simvastatin (ZOCOR) 20 MG tablet, Take 20 mg by mouth every evening., Disp: , Rfl:  zolpidem (AMBIEN) 5 MG tablet, Take 5 mg by mouth at bedtime. For insomnia, Disp: , Rfl:      Objective:   Physical Exam   Physical Exam  Nursing note and vitals reviewed. Constitutional: He is oriented to person, place, and time. He appears well-developed and well-nourished. No distress.  HENT:  Head: Normocephalic and atraumatic.  Right Ear: External ear normal.  Left Ear: External ear normal.  Mouth/Throat: Oropharynx is clear  and moist. No oropharyngeal exudate.  Postnasal drainage + Eyes: Conjunctivae and EOM are normal. Pupils are equal, round, and reactive to light. Right eye exhibits no discharge. Left eye exhibits no discharge. No scleral icterus.  Neck: Normal range of motion. Neck supple. No JVD present. No tracheal deviation present. No thyromegaly present.  Cardiovascular: Normal rate, regular rhythm and intact distal pulses.  Exam reveals no gallop and no friction rub.   No murmur heard. Pulmonary/Chest: Effort normal and breath sounds normal. No respiratory distress. He has no wheezes. He has no rales. He exhibits no tenderness.  Abdominal: Soft. Bowel sounds are normal. He exhibits no distension and no mass. There is no tenderness. There is no rebound and no guarding.  Musculoskeletal: Normal range of motion. He exhibits no edema and no tenderness.  Lymphadenopathy:    He has no cervical adenopathy.  Neurological: He is alert and oriented to person, place, and time. He has normal reflexes. No cranial nerve deficit. Coordination normal.  Skin: Skin is warm and dry. No rash noted. He is not diaphoretic. No erythema. No pallor.  Psychiatric: He has a normal mood and affect. His behavior is normal. Judgment and thought content normal     Assessment & Plan:

## 2012-12-10 NOTE — Patient Instructions (Addendum)
#  Acute bronchitis  - Take doxycycline 100mg  po twice daily x 5 days; take after meals and avoid sunlight -Take prednisone 40 mg daily x 2 days, then 20mg  daily x 2 days, then 10mg  daily x 2 days, then 5mg  daily x 2 days and stop - give sputum for gram stain and culture; will call with results  #Chronic cough  - so far no better and gabapentin not helping - slow stop gabapentin over 3 weeks - continue nasal inhalers and oral inhalers for lung - see ENT physician for chronic cough/.sinus and hearing issues as well  #Followup 2 months with cough score

## 2012-12-11 ENCOUNTER — Ambulatory Visit: Payer: Medicare PPO

## 2012-12-11 DIAGNOSIS — R05 Cough: Secondary | ICD-10-CM

## 2012-12-11 DIAGNOSIS — R053 Chronic cough: Secondary | ICD-10-CM

## 2012-12-12 ENCOUNTER — Other Ambulatory Visit: Payer: Self-pay | Admitting: Internal Medicine

## 2012-12-12 DIAGNOSIS — R05 Cough: Secondary | ICD-10-CM

## 2012-12-12 DIAGNOSIS — R053 Chronic cough: Secondary | ICD-10-CM

## 2012-12-15 ENCOUNTER — Telehealth: Payer: Self-pay | Admitting: Internal Medicine

## 2012-12-15 NOTE — Telephone Encounter (Signed)
lmtcb x1 

## 2012-12-16 ENCOUNTER — Ambulatory Visit
Admission: RE | Admit: 2012-12-16 | Discharge: 2012-12-16 | Disposition: A | Discharge status: Home / Self Care | Payer: Medicare PPO | Source: Ambulatory Visit | Attending: Family Medicine | Admitting: Family Medicine

## 2012-12-16 ENCOUNTER — Telehealth: Payer: Self-pay | Admitting: Internal Medicine

## 2012-12-16 DIAGNOSIS — N644 Mastodynia: Secondary | ICD-10-CM

## 2012-12-16 NOTE — Telephone Encounter (Signed)
Duplicate message. See phone note 12/15/12. JJ has already spoken with Dalene Seltzer today. Will sign off message

## 2012-12-16 NOTE — Telephone Encounter (Signed)
Called [facility], spoke with Dalene Seltzer who stated that she needs the source of the specimen received > per 6.25.14 ov w/ MR: Patient Instructions    #Acute bronchitis  - Take doxycycline 100mg  po twice daily x 5 days; take after meals and avoid sunlight  -Take prednisone 40 mg daily x 2 days, then 20mg  daily x 2 days, then 10mg  daily x 2 days, then 5mg  daily x 2 days and stop  - give sputum for gram stain and culture; will call with results  #Chronic cough  - so far no better and gabapentin not helping  - slow stop gabapentin over 3 weeks  - continue nasal inhalers and oral inhalers for lung  - see ENT physician for chronic cough/.sinus and hearing issues as well  #Followup  2 months with cough score   Advised Billie that per MR's last note, pt was to leave a sputum sample.  Dalene Seltzer stated that the order placed was for a gram stain BODY FLUID.  She stated that she will correct the order on their end.  Nothing further needed; will sign off.

## 2012-12-26 LAB — TEST CODE CHANGE

## 2012-12-26 LAB — LOWER RESPIRATORY CULTURE

## 2012-12-26 LAB — GRAM STAIN

## 2012-12-29 ENCOUNTER — Telehealth: Payer: Self-pay | Admitting: Internal Medicine

## 2012-12-29 ENCOUNTER — Other Ambulatory Visit (INDEPENDENT_AMBULATORY_CARE_PROVIDER_SITE_OTHER): Payer: Medicare PPO

## 2012-12-29 ENCOUNTER — Other Ambulatory Visit: Payer: Self-pay | Admitting: *Deleted

## 2012-12-29 DIAGNOSIS — N189 Chronic kidney disease, unspecified: Secondary | ICD-10-CM

## 2012-12-29 DIAGNOSIS — E1110 Type 2 diabetes mellitus with ketoacidosis without coma: Secondary | ICD-10-CM

## 2012-12-29 DIAGNOSIS — D631 Anemia in chronic kidney disease: Secondary | ICD-10-CM

## 2012-12-29 DIAGNOSIS — E1310 Other specified diabetes mellitus with ketoacidosis without coma: Secondary | ICD-10-CM

## 2012-12-29 LAB — HEPATIC FUNCTION PANEL
ALT: 16 U/L (ref 0–53)
AST: 21 U/L (ref 0–37)
Albumin: 3.6 g/dL (ref 3.5–5.2)
Total Bilirubin: 0.4 mg/dL (ref 0.3–1.2)
Total Protein: 7.3 g/dL (ref 6.0–8.3)

## 2012-12-29 LAB — MICROALBUMIN / CREATININE URINE RATIO
Creatinine,U: 135.3 mg/dL
Microalb, Ur: 15.7 mg/dL — ABNORMAL HIGH (ref 0.0–1.9)

## 2012-12-29 NOTE — Telephone Encounter (Signed)
Culture was just resulted on 12-26-12 at 5:08pm by MR. Per message culture is normal. Pt is aware. [place] Bing, CMA

## 2012-12-31 ENCOUNTER — Ambulatory Visit (INDEPENDENT_AMBULATORY_CARE_PROVIDER_SITE_OTHER): Payer: Medicare PPO | Admitting: Endocrinology

## 2012-12-31 ENCOUNTER — Encounter: Payer: Self-pay | Admitting: Endocrinology

## 2012-12-31 ENCOUNTER — Other Ambulatory Visit: Payer: Self-pay | Admitting: *Deleted

## 2012-12-31 VITALS — BP 126/62 | HR 81 | Ht 70.5 in | Wt 233.8 lb

## 2012-12-31 DIAGNOSIS — E119 Type 2 diabetes mellitus without complications: Secondary | ICD-10-CM

## 2012-12-31 NOTE — Patient Instructions (Addendum)
Reduce Humalog to 10 at Bfst and lunch; reduce more when exercising  Start Estazolam 1mg  at bedtime for sleep

## 2012-12-31 NOTE — Progress Notes (Signed)
Patient ID: ANTWUN JABLONOWSKI, male   DOB: Nov 01, 1927, 79 y.o.   MRN: [id number]  Reason for Appointment: Diabetes follow-up   History of Present Illness   Diagnosis: Type 2 DIABETES MELITUS     Oral hypoglycemic drugs: Actos and previously metformin        Side effects from medications: None Insulin regimen: Humalog 12 units before meals, Lantus 30 units at bedtime           Proper timing of medications in relation to meals: Yes.         Monitors blood glucose: Once a day.    Glucometer: One Touch.          Blood Glucose readings: Glucometer download reviewed: readings before breakfast: 77-164 recently, may be recently 88-166, suppertime 66-178 and late evening not checked recently; had readings over 200 on 7/3  Hypoglycemia frequency:  occasional, has had one low sugar of 45 after breakfast yesterday.          Meals: 3 meals per day.  breakfast: oatmeal, grits, some cheese        Physical activity: exercise: minimal recently but planning to start biking soon He will not take any Humalog if he is planning to be very active with biking             Complications: are: Nephropathy    The last HbgA1c was reported as 7.5   HYPERTENSION:  this has been relatively mild and well controlled. Has not had any increased edema recently  HYPERLIPIDEMIA:         The lipid abnormality consists of  mildly elevated LDL which has  been well controlled.    Appointment on 12/29/2012  Component Date Value Range Status  . Hemoglobin A1C 12/29/2012 7.5* 4.6 - 6.5 % Final   Glycemic Control Guidelines for People with Diabetes:Non Diabetic:  <6%Goal of Therapy: <7%Additional Action Suggested:  >8%   . Hemoglobin 12/29/2012 12.1* 13.0 - 17.0 g/dL Final  . Total Bilirubin 12/29/2012 0.4  0.3 - 1.2 mg/dL Final  . Bilirubin, Direct 12/29/2012 0.0  0.0 - 0.3 mg/dL Final  . Alkaline Phosphatase 12/29/2012 86  39 - 117 U/L Final  . AST 12/29/2012 21  0 - 37 U/L Final  . ALT 12/29/2012 16  0 - 53 U/L Final  . Total  Protein 12/29/2012 7.3  6.0 - 8.3 g/dL Final  . Albumin 12/29/2012 3.6  3.5 - 5.2 g/dL Final  . Microalb, Ur 12/29/2012 15.7* 0.0 - 1.9 mg/dL Final  . Creatinine,U 12/29/2012 135.3   Final  . Microalb Creat Ratio 12/29/2012 11.6  0.0 - 30.0 mg/g Final      Medication List       This list is accurate as of: 12/31/12  9:19 AM.  Always use your most recent med list.               albuterol 108 (90 BASE) MCG/ACT inhaler  Commonly known as:  PROVENTIL HFA;VENTOLIN HFA  Inhale 2 puffs into the lungs every 4 (four) hours as needed for wheezing or shortness of breath.     albuterol (5 MG/ML) 0.5% nebulizer solution  Commonly known as:  PROVENTIL  Take 2.5 mg by nebulization every 4 (four) hours as needed for wheezing.     Azelastine HCl 0.15 % Soln  Commonly known as:  ASTEPRO  Place 2 sprays into the nose daily.     beclomethasone 80 MCG/ACT inhaler  Commonly known as:  QVAR  Inhale 2  puffs into the lungs 2 (two) times daily.     BIFERA 28 MG Tabs  Take 1 tablet by mouth daily.     doxycycline 100 MG tablet  Commonly known as:  VIBRA-TABS  Take 1 tablet (100 mg total) by mouth 2 (two) times daily.     famotidine 10 MG tablet  Commonly known as:  PEPCID  Take 10 mg by mouth 2 (two) times daily.     finasteride 5 MG tablet  Commonly known as:  PROSCAR     finasteride 5 MG tablet  Commonly known as:  PROSCAR  Take 5 mg by mouth daily.     fluticasone 50 MCG/ACT nasal spray  Commonly known as:  FLONASE  Place 2 sprays into the nose daily.     freestyle lancets     FREESTYLE LITE test strip  Generic drug:  glucose blood     furosemide 20 MG tablet  Commonly known as:  LASIX  Take 20 mg by mouth daily.     gabapentin 300 MG capsule  Commonly known as:  NEURONTIN  Take 1 capsule (300 mg total) by mouth 3 (three) times daily.     insulin glargine 100 UNIT/ML injection  Commonly known as:  LANTUS  Inject 30 Units into the skin daily.     insulin lispro 100  UNIT/ML injection  Commonly known as:  HUMALOG  Inject 12 Units into the skin 3 (three) times daily before meals. 1 unit for every 50 over 100     ipratropium-albuterol 0.5-2.5 (3) MG/3ML Soln  Commonly known as:  DUONEB  Take 3 mLs by nebulization every 4 (four) hours as needed. PRN DX: 799.02, 162.9     MYRBETRIQ 25 MG Tb24  Generic drug:  mirabegron ER  Take 25 mg by mouth at bedtime.     omeprazole 20 MG tablet  Commonly known as:  PRILOSEC OTC  Take 20 mg by mouth daily.     pioglitazone 45 MG tablet  Commonly known as:  ACTOS  Take 45 mg by mouth. Every other day     predniSONE 10 MG tablet  Commonly known as:  DELTASONE  Take 40 mg daily x 2 days, 20 mg daily x 2 days, 10 mg daily x 2 days, 5 mg daily x 2 days then stop     simvastatin 20 MG tablet  Commonly known as:  ZOCOR  Take 20 mg by mouth every evening.     TOVIAZ 8 MG Tb24  Generic drug:  fesoterodine  Take 8 mg by mouth daily.     zolpidem 5 MG tablet  Commonly known as:  AMBIEN  Take 5 mg by mouth at bedtime. For insomnia        Allergies:  Allergies  Allergen Reactions  . Penicillins     Past Medical History  Diagnosis Date  . Diabetes mellitus   . Hypertension   . Anemia   . Chronic renal insufficiency   . Osteoporosis   . Neuromuscular disorder   . DDD (degenerative disc disease), lumbar     Past Surgical History  Procedure Laterality Date  . Vasectomy    . Eye surgery    . Video bronchoscopy Bilateral 08/25/2012    Procedure: VIDEO BRONCHOSCOPY WITHOUT FLUORO;  Surgeon: Brand Males, MD;  Location: [facility];  Service: Cardiopulmonary;  Laterality: Bilateral;    Family History  Problem Relation Age of Onset  . Diabetes Mellitus II    . Hypertension  Social History:  reports that he quit smoking about 39 years ago. His smoking use included Cigarettes. He has a 99 pack-year smoking history. He quit smokeless tobacco use about 37 years ago. He reports that he does not  drink alcohol or use illicit drugs.  Review of Systems -   He still has problems with cough with some sputum. He is being followed by pulmonologist. Does not find any relief with any medications including recent course of steroids   Examination:   BP 126/62  Pulse 81  Ht 5' 10.5" (1.791 m)  Wt 233 lb 12.8 oz (106.051 kg)  BMI 33.06 kg/m2  SpO2 97%  Body mass index is 33.06 kg/(m^2).   Assesment:   1. Diabetes type 2, uncontrolled - 250.02  The patient's diabetes control appears to be fairly good recently although he did have some high readings after getting steroids last month. His A1c of 7.5 is quite adequate for his age and comorbidities. He does appear to be having relatively lower readings at suppertime overall and some tendency to low sugars after breakfast especially if he does not have protein. He will be starting an exercise program and he knows how to avoid hypoglycemia but this  2. Diabetic nephropathy: Edema is well-controlled, does have proteinuria but currently normal albumin   PLAN:  Reduce morning Humalog to 10 units when he is not eating protein; to reduce lunchtime dose to 10 units Continue same Lantus  Jahshua Bonito 12/31/2012, 9:19 AM

## 2013-01-01 ENCOUNTER — Telehealth: Payer: Self-pay | Admitting: Endocrinology

## 2013-01-01 ENCOUNTER — Other Ambulatory Visit: Payer: Self-pay | Admitting: *Deleted

## 2013-01-01 DIAGNOSIS — E119 Type 2 diabetes mellitus without complications: Secondary | ICD-10-CM | POA: Insufficient documentation

## 2013-01-01 MED ORDER — ESTAZOLAM 2 MG PO TABS: 2.0000 mg | ORAL_TABLET | Freq: Every day | ORAL | Status: DC

## 2013-01-01 NOTE — Telephone Encounter (Signed)
Seen yesterday, requested sleep med. Says nothing was sent into his pharmacy. Please call pt @ [phone] / Gayleen Orem.

## 2013-01-02 NOTE — Assessment & Plan Note (Signed)
#  Acute bronchitis  - Take doxycycline 100mg  po twice daily x 5 days; take after meals and avoid sunlight -Take prednisone 40 mg daily x 2 days, then 20mg  daily x 2 days, then 10mg  daily x 2 days, then 5mg  daily x 2 days and stop - give sputum for gram stain and culture; will call with results  #Chronic cough  - so far no better and gabapentin not helping - slow stop gabapentin over 3 weeks - continue nasal inhalers and oral inhalers for lung - see ENT physician for chronic cough/.sinus and hearing issues as well  #Followup 2 months with cough score

## 2013-01-12 NOTE — Telephone Encounter (Signed)
Prescription sent

## 2013-01-29 ENCOUNTER — Other Ambulatory Visit: Payer: Self-pay | Admitting: *Deleted

## 2013-01-29 MED ORDER — SIMVASTATIN 20 MG PO TABS: 20.0000 mg | ORAL_TABLET | Freq: Every evening | ORAL | Status: DC

## 2013-01-29 MED ORDER — FINASTERIDE 5 MG PO TABS: 5.0000 mg | ORAL_TABLET | Freq: Every day | ORAL | Status: DC

## 2013-02-10 ENCOUNTER — Telehealth: Payer: Self-pay | Admitting: Endocrinology

## 2013-02-10 MED ORDER — FREESTYLE LANCETS MISC: Status: DC

## 2013-02-10 MED ORDER — GLUCOSE BLOOD VI STRP: ORAL_STRIP | Status: DC

## 2013-02-10 NOTE — Telephone Encounter (Signed)
Pt called pharmacy for lancets and test strips. Pharmacy says they have not gotten a response. Please call the patient, he wants to know the status / Sherri S.

## 2013-02-11 ENCOUNTER — Ambulatory Visit (INDEPENDENT_AMBULATORY_CARE_PROVIDER_SITE_OTHER): Payer: Medicare PPO | Admitting: Internal Medicine

## 2013-02-11 ENCOUNTER — Encounter: Payer: Self-pay | Admitting: Internal Medicine

## 2013-02-11 VITALS — BP 134/60 | HR 82 | Temp 98.3°F | Wt 238.0 lb

## 2013-02-11 DIAGNOSIS — R911 Solitary pulmonary nodule: Secondary | ICD-10-CM

## 2013-02-11 DIAGNOSIS — R059 Cough, unspecified: Secondary | ICD-10-CM

## 2013-02-11 DIAGNOSIS — C3490 Malignant neoplasm of unspecified part of unspecified bronchus or lung: Secondary | ICD-10-CM

## 2013-02-11 DIAGNOSIS — R05 Cough: Secondary | ICD-10-CM

## 2013-02-11 DIAGNOSIS — R053 Chronic cough: Secondary | ICD-10-CM

## 2013-02-11 NOTE — Progress Notes (Signed)
Subjective:    Patient ID: Clayton [name], male    DOB: [birth year], 79 y.o.   MRN: [id number]  HPI PCP Lujean Amel, MD   HPI #Followup  - Influenza related acute ventilatory respiratory failure on the ventilator and post influenza MRSA pneumonia - January 2014  - False positive Non-small cell lung cancer detected on an endobronchial mucous plug during acute ICU stay January 2014  - deemed false positive based on - PET scan March 2014 and normal bronch March 2014 and 2nd opinion Jan 2014 path by DR Dian Situ  - Chronic cough that predates above  - PFT 09/02/12: His pulmonary function test today is essentially normal except for reduced forced vital capacity, 9% bronchodilator response with FEV1 and in reduced small airway function. Specifically, FEV1 is 2.1 L/80%. FVC 3.1 L/73%. Bronchodilator response FEV1 is 9%. Small airways is 60%. Total lung capacity is 83%. DLCO is 85%.  - March 2014: Normal bronchoscopy     OV 10/13/2012  Follow for chronic cough.  -At last visit on 08/29/2012 started him on a 2 part, nasal steroid, over-the-counter proton pump inhibitor and asked him to continue on DuoNeb and Qvar. In addition started gabapentin and referred him to speech therapy. With these interventions cough is on 50% better and RSI cough score is reduced from 16 to 9. However, he and his [name] are not happy with the level of improvement. Diffusing quality of life is impaired because he's unable to attend occasional social functions and they feel cough is a social embarrassment. They admit to significant gagging and tearing of the throat. Of note, he did and speech therapy for classes but since has been discharged from followup  - In terms of the supposed to lung cancer got an e-mail from Dr. Dian Situ who feels that his pathology but did false positive atypia and that she is seen cases like this and patient to be acutely ill.  REC #Sleep apnea  -Room air cpap to  continue  #Lung cancer - No evidence of lung cancer as of April 2014 - Pathologist in [place] says likely reaction to acute infection - Followup CT chest 1 year   #Chronic cough Cough is from sinus drainage, possible acid reflux, possible asthma,  All of this is working together to cause cyclical cough/LPR cough otherwise for irritable larynx  #Sinus drainage  - continue netti pot daily  or 3% hypertonic saline nasal spray -  Continue nasal steroid generic fluticasone inhaler 2 squirts each nostril daily as advised  - start asteprol nasal inhaler day time 2 squirts - ENT consult depending on course  #Possible Acid Reflux  - Ok to stop  zegerid 20mg    - BUT stop all drinks except water - Aoid colas, spices, cheeses, spirits, red meats, beer, chocolates, fried foods etc.,   - sleep with head end of bed elevated  - eat small frequent meals  - do not go to bed for 3 hours after last meal  #Possible Asthma  - Continue DuoNeb 2-4 times daily as scheduled - Continuet QVAR 16mcg 2 puff twice daily   #Cyclical cough or irritable larynx - Continue gabapentin 300mg  othree times daily to continue.  - take 3 days and do complete voice rest  - at all times there  there is urge to cough, drink water or swallow or sip on throat lozenge  #Followup - I will see you in 6 weeks.  - Cough score at followup - any problems call or come  sooner   OV 12/10/2012 FU chronic cough  - says chronic cough is unresolved. Stable though. Not worse. Not better. This is despite giving trials of quitting cheese, coffee. Gabpentin not helping.  Cough still associted iwht chronic throat clearing. However, cough has changed acutely. One daughter started coughing acutely 5 days ago. Then his cough changed 3-4 days ago. Now the cough is like a "regul;ar cough". Sputum is yellow and new (baseline dry cough). INsidious onset. Sick contact +. No associated wheeze or dyspnea  Past, Family, Social reviewed: no change  since last visit. Other than: Overall. Rides bike daily on the road - recumbent trike. Bikes 14 miles daily but will stip after first mile.   REC  Past, Family, Social reviewed: no change since last visit   #Acute bronchitis  - Take doxycycline 100mg  po twice daily x 5 days; take after meals and avoid sunlight  -Take prednisone 40 mg daily x 2 days, then 20mg  daily x 2 days, then 10mg  daily x 2 days, then 5mg  daily x 2 days and stop  - give sputum for gram stain and culture; will call with results  #Chronic cough  - so far no better and gabapentin not helping  - slow stop gabapentin over 3 weeks  - continue nasal inhalers and oral inhalers for lung  - see ENT physician for chronic cough/.sinus and hearing issues as well  #Followup  2 months with cough score   OV 02/11/2013 qvar not helping so stopped.  Nebs help but Cough overall a bother. RSI score 19 and shows lack of improvement. He is frustrated by the lack of improvement in his cough. Neurontin not helping. Nasal inhalers helping only partially He shows me each visit take brown sputum. All features upon chronic bronchitis but he really wants relief from his cough. He is open to a second opinion    Dr Lorenza Cambridge Reflux Symptom Index (> 13-15 suggestive of LPR cough) 0 -> 5  08/18/2012  10/13/2012  12/10/2012 Acute change x 4 days 02/11/2013   Hoarseness of problem with voice 5 3 3 3   Clearing  Of Throat 5 3 3 2   Excess throat mucus or feeling of post nasal drip 0 0 4 5  Difficulty swallowing food, liquid or tablets 0 0 0 0  Cough after eating or lying down 2 0 0 3  Breathing difficulties or choking episodes 0 0 0 0  Troublesome or annoying cough 4 3 3 5   Sensation of something sticking in throat or lump in throat 0 0 1 0  Heartburn, chest pain, indigestion, or stomach acid coming up 0 0 0 0  TOTAL 16 9 14 19      Past, Family, Social reviewed: no change since last visit   Review of Systems  Constitutional: Negative for  fever and unexpected weight change.  HENT: Negative for ear pain, nosebleeds, congestion, sore throat, rhinorrhea, sneezing, trouble swallowing, dental problem, postnasal drip and sinus pressure.   Eyes: Negative for redness and itching.  Respiratory: Positive for cough. Negative for chest tightness, shortness of breath and wheezing.   Cardiovascular: Negative for palpitations and leg swelling.  Gastrointestinal: Negative for nausea and vomiting.  Genitourinary: Negative for dysuria.  Musculoskeletal: Negative for joint swelling.  Skin: Negative for rash.  Neurological: Negative for headaches.  Hematological: Does not bruise/bleed easily.  Psychiatric/Behavioral: Negative for dysphoric mood. The patient is not nervous/anxious.   Current outpatient prescriptions:albuterol (PROVENTIL HFA;VENTOLIN HFA) 108 (90 BASE) MCG/ACT inhaler, Inhale 2 puffs  into the lungs every 4 (four) hours as needed for wheezing or shortness of breath., Disp: , Rfl: ;  albuterol (PROVENTIL) (5 MG/ML) 0.5% nebulizer solution, Take 2.5 mg by nebulization every 4 (four) hours as needed for wheezing., Disp: , Rfl:  Azelastine HCl (ASTEPRO) 0.15 % SOLN, Place 2 sprays into the nose daily., Disp: 30 mL, Rfl: 6;  beclomethasone (QVAR) 80 MCG/ACT inhaler, Inhale 2 puffs into the lungs 2 (two) times daily., Disp: 1 Inhaler, Rfl: 6;  estazolam (PROSOM) 2 MG tablet, Take 1 tablet (2 mg total) by mouth at bedtime., Disp: 30 tablet, Rfl: 2;  famotidine (PEPCID) 10 MG tablet, Take 10 mg by mouth 2 (two) times daily., Disp: , Rfl:  fesoterodine (TOVIAZ) 8 MG TB24, Take 8 mg by mouth daily., Disp: , Rfl: ;  finasteride (PROSCAR) 5 MG tablet, Take 1 tablet (5 mg total) by mouth daily., Disp: 30 tablet, Rfl: 5;  fluticasone (FLONASE) 50 MCG/ACT nasal spray, Place 2 sprays into the nose daily., Disp: 16 g, Rfl: 2;  furosemide (LASIX) 20 MG tablet, Take 20 mg by mouth daily. , Disp: , Rfl:  gabapentin (NEURONTIN) 300 MG capsule, Take 1 capsule  (300 mg total) by mouth 3 (three) times daily., Disp: 90 capsule, Rfl: 6;  glucose blood (FREESTYLE LITE) test strip, Use to check blood sugars 4 times per day, Disp: 150 each, Rfl: 5;  insulin glargine (LANTUS) 100 UNIT/ML injection, Inject 30 Units into the skin daily. , Disp: , Rfl:  insulin lispro (HUMALOG) 100 UNIT/ML injection, Inject 12 Units into the skin 3 (three) times daily before meals. 1 unit for every 50 over 100, Disp: , Rfl: ;  ipratropium-albuterol (DUONEB) 0.5-2.5 (3) MG/3ML SOLN, Take 3 mLs by nebulization every 4 (four) hours as needed. PRN DX: 799.02, 162.9, Disp: , Rfl: ;  Lancets (FREESTYLE) lancets, Use to check blood sugars 4 times per day, Disp: 150 each, Rfl: 5 mirabegron ER (MYRBETRIQ) 25 MG TB24, Take 25 mg by mouth at bedtime., Disp: , Rfl: ;  omeprazole (PRILOSEC OTC) 20 MG tablet, Take 20 mg by mouth daily., Disp: , Rfl: ;  pioglitazone (ACTOS) 45 MG tablet, Take 45 mg by mouth. Every other day, Disp: , Rfl: ;  Polysacch Fe Cmp-Fe Heme Poly (BIFERA) 28 MG TABS, Take 1 tablet by mouth daily., Disp: , Rfl:  simvastatin (ZOCOR) 20 MG tablet, Take 1 tablet (20 mg total) by mouth every evening., Disp: 30 tablet, Rfl: 5      Objective:   Physical Exam  Nursing note and vitals reviewed. Constitutional: He is oriented to person, place, and time. He appears well-developed and well-nourished. No distress.  HENT:  Head: Normocephalic and atraumatic.  Right Ear: External ear normal.  Left Ear: External ear normal.  Mouth/Throat: Oropharynx is clear and moist. No oropharyngeal exudate.  Postnasal drainage. The patient gagging of the throat and clearing  Eyes: Conjunctivae and EOM are normal. Pupils are equal, round, and reactive to light. Right eye exhibits no discharge. Left eye exhibits no discharge. No scleral icterus.  Neck: Normal range of motion. Neck supple. No JVD present. No tracheal deviation present. No thyromegaly present.  Cardiovascular: Normal rate, regular  rhythm and intact distal pulses.  Exam reveals no gallop and no friction rub.   No murmur heard. Pulmonary/Chest: Effort normal and breath sounds normal. No respiratory distress. He has no wheezes. He has no rales. He exhibits no tenderness.  Abdominal: Soft. Bowel sounds are normal. He exhibits no distension and no  mass. There is no tenderness. There is no rebound and no guarding.  Musculoskeletal: Normal range of motion. He exhibits no edema and no tenderness.  Lymphadenopathy:    He has no cervical adenopathy.  Neurological: He is alert and oriented to person, place, and time. He has normal reflexes. No cranial nerve deficit. Coordination normal.  Skin: Skin is warm and dry. No rash noted. He is not diaphoretic. No erythema. No pallor.  Psychiatric: He has a normal mood and affect. His behavior is normal. Judgment and thought content normal.         Assessment & Plan:

## 2013-02-11 NOTE — Patient Instructions (Addendum)
#  Chronic cough  - so far no better despite all measures I have tried  - stop QVAR due to lack of relief -continue nebs and other nasal treatment  #Lung nodule  - next scan needed Jan 2015  #Followup DR wErt for 2nd opinion on cough Return to see me JAn 2015 with CT chest

## 2013-02-15 NOTE — Assessment & Plan Note (Signed)
#  Lung nodule  - next scan needed Jan 2015

## 2013-02-15 NOTE — Assessment & Plan Note (Signed)
#  Chronic cough  - so far no better despite all measures I have tried  - stop QVAR due to lack of relief -continue nebs and other nasal treatment #Followup DR wErt for 2nd opinion on cough Return to see me JAn 2015 with CT chest

## 2013-02-19 ENCOUNTER — Telehealth: Payer: Self-pay | Admitting: Endocrinology

## 2013-02-19 NOTE — Telephone Encounter (Signed)
Why does he need Amaryl, decide on Metformin after next labs

## 2013-02-19 NOTE — Telephone Encounter (Signed)
Pt wants to know if he can start back on the Amaryl 2 mg and the metformin 500 or 1000mg ?

## 2013-02-23 ENCOUNTER — Telehealth: Payer: Self-pay | Admitting: Endocrinology

## 2013-02-23 NOTE — Telephone Encounter (Signed)
He said he was trying to cut down on the amount of insulin he uses and thought the amaryl would help, he has an abundance of the Amaryl and Metformin, he said it would save him $ in the end.

## 2013-02-23 NOTE — Telephone Encounter (Signed)
Would not like to do this, Amaryl lasts much longer and may get him into low blood sugars and difficult to regulate compared to insulin

## 2013-02-24 NOTE — Telephone Encounter (Signed)
Noted, pt is aware 

## 2013-03-04 ENCOUNTER — Encounter: Payer: Self-pay | Admitting: Internal Medicine

## 2013-03-04 ENCOUNTER — Ambulatory Visit (INDEPENDENT_AMBULATORY_CARE_PROVIDER_SITE_OTHER): Payer: Medicare PPO | Admitting: Internal Medicine

## 2013-03-04 VITALS — BP 132/70 | HR 95 | Temp 98.0°F | Ht 71.0 in | Wt 242.0 lb

## 2013-03-04 DIAGNOSIS — N189 Chronic kidney disease, unspecified: Secondary | ICD-10-CM

## 2013-03-04 DIAGNOSIS — R0609 Other forms of dyspnea: Secondary | ICD-10-CM

## 2013-03-04 DIAGNOSIS — R053 Chronic cough: Secondary | ICD-10-CM

## 2013-03-04 DIAGNOSIS — R059 Cough, unspecified: Secondary | ICD-10-CM

## 2013-03-04 DIAGNOSIS — R0600 Dyspnea, unspecified: Secondary | ICD-10-CM | POA: Insufficient documentation

## 2013-03-04 DIAGNOSIS — R05 Cough: Secondary | ICD-10-CM

## 2013-03-04 MED ORDER — FAMOTIDINE 20 MG PO TABS: ORAL_TABLET | ORAL | Status: DC

## 2013-03-04 MED ORDER — LEVOFLOXACIN 750 MG PO TABS: 750.0000 mg | ORAL_TABLET | Freq: Every day | ORAL | Status: DC

## 2013-03-04 MED ORDER — PANTOPRAZOLE SODIUM 40 MG PO TBEC: 40.0000 mg | DELAYED_RELEASE_TABLET | Freq: Every day | ORAL | Status: DC

## 2013-03-04 MED ORDER — PREDNISONE (PAK) 10 MG PO TABS: ORAL_TABLET | ORAL | Status: DC

## 2013-03-04 NOTE — Progress Notes (Signed)
Subjective:    Patient ID: Clayton [name], male    DOB: [birth year]    MRN: [id number]  HPI PCP Clayton Amel, MD   HPI #Followup  - Influenza related acute ventilatory respiratory failure on the ventilator and post influenza MRSA pneumonia - January 2014  - False positive Non-small cell lung cancer detected on an endobronchial mucous plug during acute ICU stay January 2014  - deemed false positive based on - PET scan March 2014 and normal bronch March 2014 and 2nd opinion Jan 2014 path by DR Clayton [name]  - Chronic cough that predates above  - PFT 09/02/12: His pulmonary function test today is essentially normal except for reduced forced vital capacity, 9% bronchodilator response with FEV1 and in reduced small airway function. Specifically, FEV1 is 2.1 L/80%. FVC 3.1 L/73%. Bronchodilator response FEV1 is 9%. Small airways is 60%. Total lung capacity is 83%. DLCO is 85%.  - March 2014: Normal bronchoscopy     OV 10/13/2012  Follow for chronic cough.  -At last visit on 08/29/2012 started him on a 2 part, nasal steroid, over-the-counter proton pump inhibitor and asked him to continue on DuoNeb and Qvar. In addition started gabapentin and referred him to speech therapy. With these interventions cough is on 50% better and RSI cough score is reduced from 16 to 9. However, he and his daughter Clayton [name] are not happy with the level of improvement. Diffusing quality of life is impaired because he's unable to attend occasional social functions and they feel cough is a social embarrassment. They admit to significant gagging and tearing of the throat. Of note, he did and speech therapy for classes but since has been discharged from followup  - In terms of the supposed to lung cancer got an e-mail from Dr. Dian [name] who feels that his pathology but did false positive atypia and that she is seen cases like this and patient to be acutely ill.  REC #Sleep apnea  -Room air cpap to continue  #Lung  cancer - No evidence of lung cancer as of April 2014 - Pathologist in [place] says likely reaction to acute infection - Followup CT chest 1 year   #Chronic cough Cough is from sinus drainage, possible acid reflux, possible asthma,  All of this is working together to cause cyclical cough/LPR cough otherwise for irritable larynx  #Sinus drainage  - continue netti pot daily  or 3% hypertonic saline nasal spray -  Continue nasal steroid generic fluticasone inhaler 2 squirts each nostril daily as advised  - start asteprol nasal inhaler day time 2 squirts - ENT consult depending on course      #Acute bronchitis  - Take doxycycline 100mg  po twice daily x 5 days; take after meals and avoid sunlight  -Take prednisone 40 mg daily x 2 days, then 20mg  daily x 2 days, then 10mg  daily x 2 days, then 5mg  daily x 2 days and stop  - give sputum for gram stain and culture; will call with results  #Chronic cough  - so far no better and gabapentin not helping  - slow stop gabapentin over 3 weeks  - continue nasal inhalers and oral inhalers for lung  - see ENT physician for chronic cough/.sinus and hearing issues as well  #Followup  2 months with cough score   OV 02/11/2013 qvar not helping so stopped.  Nebs help but Cough overall a bother. RSI score 19 and shows lack of improvement. He is frustrated by the lack of improvement in his  cough. Neurontin not helping. Nasal inhalers helping only partially He shows me each visit take brown sputum. All features upon chronic bronchitis but he really wants relief from his cough. He is open to a second opinion rec  #Chronic cough  - so far no better despite all measures I have tried  - stop QVAR due to lack of relief -continue nebs and other nasal treatment  #Lung nodule  - next scan needed Jan 2015  #Followup Dr Clayton [name] for 2nd opinion on cough Return to see me JAn 2015 with CT chest   03/04/2013 Clayton [name]/ Consultation/ second opinion:   re persistent  cough ever since the flu requiring ET Chief Complaint  Patient presents with  . Pulmonary Consult    Referred per MR for second opinion on cough. Pt c/o cough since Jan 2014. Cough is prod with moderate pale yellow sputum.  He states that there is no specific thing that triggers the cough. He c/o increased SOB and leg cramps for the past wk. Using neb txs approx 3 times per day.    cough daily ever since tube pulled with waxes and wanes some s pattern x better p neb treatment always productive up to 2 tbsp per day thick yellow mucus  No obvious daytime variabilty or assoc   cp or chest tightness, subjective wheeze overt sinus or hb symptoms. No unusual exp hx or h/o childhood pna/ asthma or knowledge of premature birth.   Sleeping ok without nocturnal  or early am exacerbation  of respiratory  c/o's or need for noct saba. Also denies any obvious fluctuation of symptoms with weather or environmental changes or other aggravating or alleviating factors except as outlined above  Current Medications, Allergies, Complete Past Medical History, Past Surgical History, Family History, and Social History were reviewed in [employer] record.  ROS  The following are not active complaints unless bolded sore throat, dysphagia, dental problems, itching, sneezing,  nasal congestion or excess/ purulent secretions, ear ache,   fever, chills, sweats, unintended wt loss, pleuritic or exertional cp, hemoptysis,  orthopnea pnd or leg swelling, presyncope, palpitations, heartburn, abdominal pain, anorexia, nausea, vomiting, diarrhea  or change in bowel or urinary habits, change in stools or urine, dysuria,hematuria,  rash, arthralgias, visual complaints, headache, numbness weakness or ataxia or problems with walking or coordination,  change in mood/affect or memory.             Objective:   Physical Exam  Nursing note and vitals reviewed. Constitutional: He is oriented to person, place, and  time. He appears well-developed and well-nourished. No distress.  HENT:  Head: Normocephalic and atraumatic.  Right Ear: External ear normal.  Left Ear: External ear normal.  Mouth/Throat: Oropharynx is clear and moist. No oropharyngeal exudate.  Eyes: Conjunctivae and EOM are normal. Pupils are equal, round, and reactive to light. Right eye exhibits no discharge. Left eye exhibits no discharge. No scleral icterus.  Neck: Normal range of motion. Neck supple. No JVD present. No tracheal deviation present. No thyromegaly present.  Cardiovascular: Normal rate, regular rhythm and intact distal pulses.  Exam reveals no gallop and no friction rub.   No murmur heard. Pulmonary/Chest junky insp and exp rhonchi Abdominal: Soft. Bowel sounds are normal. He exhibits no distension and no mass. There is no tenderness. There is no rebound and no guarding.  Musculoskeletal: Normal range of motion. He exhibits no edema and no tenderness.  Lymphadenopathy:    He has no cervical adenopathy.  Neurological:  He is alert and oriented to person, place, and time. He has normal reflexes. No cranial nerve deficit. Coordination normal.  Skin: Skin is warm and dry. No rash noted. He is not diaphoretic. No erythema. No pallor.  Psychiatric: He has a normal mood and affect. His behavior is normal. Judgment and thought content normal.  2+ pitting edema both LE's    CXR  03/04/2013 : Streaky left basilar atelectasis, scarring or infiltrate.    CT sinus 03/05/2013 Mild deviation of the nasal septum to the right. No evidence of sinusitis.      Assessment & Plan:

## 2013-03-04 NOTE — Patient Instructions (Addendum)
Levaquin 750 mg one daily x 5 days Prednisone 10 mg take  4 each am x 2 days,   2 each am x 2 days,  1 each am x 2 days and stop  For cough mucinex dm 1200 mg every 12 hours use your flutter  dulera 100 Take 2 puffs first thing in am and then another 2 puffs about 12 hours later -work on technique Pantoprazole (protonix) 40 mg   Take 30-60 min before first meal of the day and Pepcid 20 mg one bedtime until return to office - this is the best way to tell whether stomach acid is contributing to your problem.    GERD (REFLUX)  is an extremely common cause of respiratory symptoms, many times with no significant heartburn at all.    It can be treated with medication, but also with lifestyle changes including avoidance of late meals, excessive alcohol, smoking cessation, and avoid fatty foods, chocolate, peppermint, colas, red wine, and acidic juices such as orange juice.  NO MINT OR MENTHOL PRODUCTS SO NO COUGH DROPS  USE SUGARLESS CANDY INSTEAD (jolley ranchers or Stover's)  NO OIL BASED VITAMINS - use powdered substitutes.  Please see patient coordinator before you leave today  to schedule sinus CT  Please remember to go to the lab and x-ray department downstairs for your tests - we will call you with the results when they are available.    See Tammy NP w/in 2 weeks with all your medications, even over the counter meds, separated in two separate bags, the ones you take no matter what vs the ones you stop once you feel better and take only as needed when you feel you need them.   Tammy  will generate for you a new user friendly medication calendar that will put [place] all on the same page re: your medication use.     Without this process, it simply isn't possible to assure that we are providing  your outpatient care  with  the attention to detail we feel you deserve.   If we cannot assure that you're getting that kind of care,  then we cannot manage your problem effectively from this clinic.  Once you  have seen Tammy and we are sure that we're all on the same page with your medication use she will arrange follow up with me.

## 2013-03-05 ENCOUNTER — Ambulatory Visit (INDEPENDENT_AMBULATORY_CARE_PROVIDER_SITE_OTHER): Payer: Medicare PPO

## 2013-03-05 ENCOUNTER — Encounter: Payer: Self-pay | Admitting: Internal Medicine

## 2013-03-05 ENCOUNTER — Other Ambulatory Visit (INDEPENDENT_AMBULATORY_CARE_PROVIDER_SITE_OTHER): Payer: Medicare PPO

## 2013-03-05 ENCOUNTER — Ambulatory Visit (INDEPENDENT_AMBULATORY_CARE_PROVIDER_SITE_OTHER)
Admission: RE | Admit: 2013-03-05 | Discharge: 2013-03-05 | Disposition: A | Discharge status: Home / Self Care | Payer: Medicare PPO | Source: Ambulatory Visit | Attending: Internal Medicine | Admitting: Internal Medicine

## 2013-03-05 DIAGNOSIS — R0600 Dyspnea, unspecified: Secondary | ICD-10-CM

## 2013-03-05 DIAGNOSIS — R0609 Other forms of dyspnea: Secondary | ICD-10-CM

## 2013-03-05 DIAGNOSIS — R05 Cough: Secondary | ICD-10-CM

## 2013-03-05 DIAGNOSIS — Z23 Encounter for immunization: Secondary | ICD-10-CM

## 2013-03-05 DIAGNOSIS — R059 Cough, unspecified: Secondary | ICD-10-CM

## 2013-03-05 DIAGNOSIS — R053 Chronic cough: Secondary | ICD-10-CM

## 2013-03-05 LAB — CBC WITH DIFFERENTIAL/PLATELET
Basophils Absolute: 0 10*3/uL (ref 0.0–0.1)
Eosinophils Absolute: 0 10*3/uL (ref 0.0–0.7)
Lymphocytes Relative: 6.1 % — ABNORMAL LOW (ref 12.0–46.0)
MCHC: 34 g/dL (ref 30.0–36.0)
Neutrophils Relative %: 91.2 % — ABNORMAL HIGH (ref 43.0–77.0)
Platelets: 269 10*3/uL (ref 150.0–400.0)
RDW: 14.2 % (ref 11.5–14.6)

## 2013-03-05 LAB — BASIC METABOLIC PANEL
BUN: 42 mg/dL — ABNORMAL HIGH (ref 6–23)
CO2: 24 mEq/L (ref 19–32)
Calcium: 9 mg/dL (ref 8.4–10.5)
Creatinine, Ser: 1.7 mg/dL — ABNORMAL HIGH (ref 0.4–1.5)
GFR: 41.96 mL/min — ABNORMAL LOW (ref >60.00)
Glucose, Bld: 247 mg/dL — ABNORMAL HIGH (ref 70–99)

## 2013-03-05 NOTE — Progress Notes (Signed)
Quick Note:  Spoke with pt and notified of results per Dr. Wert. Pt verbalized understanding and denied any questions.  ______ 

## 2013-03-05 NOTE — Assessment & Plan Note (Signed)
Lab Results  Component Value Date   CREATININE 1.7* 03/05/2013   CREATININE 1.43* 07/01/2012   CREATININE 1.41* 06/30/2012    ? Vol overload from this or actos > defer rx to primary

## 2013-03-05 NOTE — Assessment & Plan Note (Addendum)
-   Sinus CT 03/05/2013 > Mild deviation of the nasal septum to the right. No evidence of  Sinusitis  Lack of cough resolution could mean an alternative diagnosis (refractory asthma, cardiac asthma), persistence of the disease state(bronchietasis) , or inadequacy of currently available therapy (eg no rx available for non-acid gerd)   rec Check bnp Consider repeat ct chest dulera 100 2bid  ? Acid (or non-acid) GERD > always difficult to exclude as up to 75% of pts in some series report no assoc GI/ Heartburn symptoms> rec max (24h)  acid suppression and diet restrictions/ reviewed and instructions given in writting

## 2013-03-05 NOTE — Assessment & Plan Note (Signed)
Needs pft's once we have the cough better addressed   To keep things simple, I have asked the patient to first separate medicines that are perceived as maintenance, that is to be taken daily "no matter what", from those medicines that are taken on only on an as-needed basis and I have given the patient examples of both, and then return to see our NP to generate a  detailed  medication calendar which should be followed until the next physician sees the patient and updates it.

## 2013-03-06 ENCOUNTER — Encounter: Payer: Self-pay | Admitting: Internal Medicine

## 2013-03-06 LAB — ALLERGY PROFILE REGION II-DC, DE, MD, ~~LOC~~, VA
Allergen, D pternoyssinus,d7: 0.18 kU/L — ABNORMAL HIGH
Alternaria Alternata: <0.1 kU/L
Bermuda Grass: <0.1 kU/L
Box Elder IgE: <0.1 kU/L
Cat Dander: 0.15 kU/L — ABNORMAL HIGH
Common Ragweed: 0.12 kU/L — ABNORMAL HIGH
Dog Dander: 0.2 kU/L — ABNORMAL HIGH
Elm IgE: <0.1 kU/L
Pecan/Hickory Tree IgE: <0.1 kU/L

## 2013-03-06 NOTE — Progress Notes (Signed)
Quick Note:  Spoke with pt and notified of results per Dr. Wert. Pt verbalized understanding and denied any questions.  ______ 

## 2013-03-09 ENCOUNTER — Telehealth: Payer: Self-pay | Admitting: Endocrinology

## 2013-03-09 NOTE — Telephone Encounter (Signed)
Please advise 

## 2013-03-09 NOTE — Telephone Encounter (Signed)
Okay, he will need to let us know if blood sugars start going up

## 2013-03-09 NOTE — Telephone Encounter (Signed)
Patient notified

## 2013-03-11 ENCOUNTER — Ambulatory Visit: Payer: Medicare PPO | Admitting: Internal Medicine

## 2013-03-12 ENCOUNTER — Telehealth: Payer: Self-pay | Admitting: Internal Medicine

## 2013-03-12 NOTE — Telephone Encounter (Signed)
Last OV 03/04/13. I spoke with the pt and he states since taking prednisone his cough is much improved. He wants to have his prednisone taper extended at a low dose for a few more days to make sure his cough does not return. I advised that it is not a medication that the doctor likes to keep pt on for an extended period of time, especially if their symptoms are better. The pt states he just wants a few more days to make sure cough does not return. Please advise. Poynette Bing, CMA Allergies  Allergen Reactions  . Penicillins

## 2013-03-12 NOTE — Telephone Encounter (Signed)
Prednisone 10 mg 2 daily until 100% better and then 1 daily for 3 days and stop  Rx 20

## 2013-03-13 MED ORDER — PREDNISONE 10 MG PO TABS: ORAL_TABLET | ORAL | Status: DC

## 2013-03-13 NOTE — Telephone Encounter (Signed)
Called, spoke with pt.  Informed him of below per MW.  He verbalized understanding of this, is aware rx sent to [place] per his request, and is to call back if anything further is needed.

## 2013-03-16 ENCOUNTER — Telehealth: Payer: Self-pay | Admitting: Internal Medicine

## 2013-03-16 ENCOUNTER — Telehealth: Payer: Self-pay | Admitting: Endocrinology

## 2013-03-16 NOTE — Telephone Encounter (Signed)
He needs to increase his Lantus by 6 units and Humalog by 6-8 units as long as blood sugar is over 200 What is the  prednisone for and how long will he take this?

## 2013-03-16 NOTE — Telephone Encounter (Signed)
Noted, pt is aware, he said he's been coughing up large amounts of mucus since he was in the hospital in January for Pneumonia and the flu, the Dr. Doristine Section him on prednisone to help alleviate the cough, he's unsure how long he will be on the medication

## 2013-03-16 NOTE — Telephone Encounter (Signed)
Pt has been prednisone and his blood sugars have been running high, around the 250's  Last 3 days   195 on 19th fasting 162 on 26th fasting 145 on 26th at lunch.  This morning was 229 fasting, at lunch it was 220.

## 2013-03-16 NOTE — Telephone Encounter (Signed)
I spoke with pt. I advised him I do not see where anyone here has tried to reach him. It could have been the phone tree reminding him of his appt. Nothing further needed

## 2013-03-18 ENCOUNTER — Encounter: Payer: Self-pay | Admitting: Adult Health

## 2013-03-18 ENCOUNTER — Ambulatory Visit (INDEPENDENT_AMBULATORY_CARE_PROVIDER_SITE_OTHER): Payer: Medicare PPO | Admitting: Adult Health

## 2013-03-18 ENCOUNTER — Other Ambulatory Visit: Payer: Self-pay | Admitting: *Deleted

## 2013-03-18 VITALS — BP 146/74 | HR 104 | Temp 97.1°F | Ht 71.0 in | Wt 238.0 lb

## 2013-03-18 DIAGNOSIS — R053 Chronic cough: Secondary | ICD-10-CM

## 2013-03-18 DIAGNOSIS — R05 Cough: Secondary | ICD-10-CM

## 2013-03-18 DIAGNOSIS — R059 Cough, unspecified: Secondary | ICD-10-CM

## 2013-03-18 MED ORDER — PREDNISONE 10 MG PO TABS: ORAL_TABLET | ORAL | Status: DC

## 2013-03-18 MED ORDER — INSULIN GLARGINE 100 UNIT/ML ~~LOC~~ SOLN: 26.0000 [IU] | Freq: Every day | SUBCUTANEOUS | Status: DC

## 2013-03-18 MED ORDER — FLUTTER DEVI: Status: DC

## 2013-03-18 NOTE — Progress Notes (Signed)
Subjective:    Patient ID: DANA BESTOR, male    DOB: 01-21-1928    MRN: [id number]  HPI PCP Lujean Amel, MD   HPI #Followup  - Influenza related acute ventilatory respiratory failure on the ventilator and post influenza MRSA pneumonia - January 2014  - False positive Non-small cell lung cancer detected on an endobronchial mucous plug during acute ICU stay January 2014  - deemed false positive based on - PET scan March 2014 and normal bronch March 2014 and 2nd opinion Jan 2014 path by DR Dian Situ  - Chronic cough that predates above  - PFT 09/02/12: His pulmonary function test today is essentially normal except for reduced forced vital capacity, 9% bronchodilator response with FEV1 and in reduced small airway function. Specifically, FEV1 is 2.1 L/80%. FVC 3.1 L/73%. Bronchodilator response FEV1 is 9%. Small airways is 60%. Total lung capacity is 83%. DLCO is 85%.  - March 2014: Normal bronchoscopy   OV 10/13/2012  Follow for chronic cough.  -At last visit on 08/29/2012 started him on a 2 part, nasal steroid, over-the-counter proton pump inhibitor and asked him to continue on DuoNeb and Qvar. In addition started gabapentin and referred him to speech therapy. With these interventions cough is on 50% better and RSI cough score is reduced from 16 to 9. However, he and his [name] are not happy with the level of improvement. Diffusing quality of life is impaired because he's unable to attend occasional social functions and they feel cough is a social embarrassment. They admit to significant gagging and tearing of the throat. Of note, he did and speech therapy for classes but since has been discharged from followup  - In terms of the supposed to lung cancer got an e-mail from Dr. Dian Situ who feels that his pathology but did false positive atypia and that she is seen cases like this and patient to be acutely ill.  REC #Sleep apnea  -Room air cpap to continue  #Lung cancer -  No evidence of lung cancer as of April 2014 - Pathologist in [place] says likely reaction to acute infection - Followup CT chest 1 year   #Chronic cough Cough is from sinus drainage, possible acid reflux, possible asthma,  All of this is working together to cause cyclical cough/LPR cough otherwise for irritable larynx  #Sinus drainage  - continue netti pot daily  or 3% hypertonic saline nasal spray -  Continue nasal steroid generic fluticasone inhaler 2 squirts each nostril daily as advised  - start asteprol nasal inhaler day time 2 squirts - ENT consult depending on course      #Acute bronchitis  - Take doxycycline 100mg  po twice daily x 5 days; take after meals and avoid sunlight  -Take prednisone 40 mg daily x 2 days, then 20mg  daily x 2 days, then 10mg  daily x 2 days, then 5mg  daily x 2 days and stop  - give sputum for gram stain and culture; will call with results  #Chronic cough  - so far no better and gabapentin not helping  - slow stop gabapentin over 3 weeks  - continue nasal inhalers and oral inhalers for lung  - see ENT physician for chronic cough/.sinus and hearing issues as well  #Followup  2 months with cough score   OV 02/11/2013 qvar not helping so stopped.  Nebs help but Cough overall a bother. RSI score 19 and shows lack of improvement. He is frustrated by the lack of improvement in his cough. Neurontin  not helping. Nasal inhalers helping only partially He shows me each visit take brown sputum. All features upon chronic bronchitis but he really wants relief from his cough. He is open to a second opinion rec  #Chronic cough  - so far no better despite all measures I have tried  - stop QVAR due to lack of relief -continue nebs and other nasal treatment  #Lung nodule  - next scan needed Jan 2015  #Followup Dr Melvyn Novas for 2nd opinion on cough Return to see me JAn 2015 with CT chest   03/04/2013 Wert/ Consultation/ second opinion:   re persistent cough ever  since the flu requiring ET Chief Complaint  Patient presents with  . Pulmonary Consult    Referred per MR for second opinion on cough. Pt c/o cough since Jan 2014. Cough is prod with moderate pale yellow sputum.  He states that there is no specific thing that triggers the cough. He c/o increased SOB and leg cramps for the past wk. Using neb txs approx 3 times per day.    cough daily ever since tube pulled with waxes and wanes some s pattern x better p neb treatment always productive up to 2 tbsp per day thick yellow mucus >>Levaquin 750 mg one daily x 5 days Prednisone 10 mg take  4 each am x 2 days,   2 each am x 2 days,  1 each am x 2 days and stop  For cough mucinex dm 1200 mg every 12 hours use your flutter ,dulera 100 Take 2 puffs first thing in am and then another 2 puffs about 12 hours later -work on technique Pantoprazole (protonix) 40 mg   Take 30-60 min before first meal of the day and Pepcid 20 mg one bedtime until return to office -    03/18/2013 Follow up and med review  Patient returns for a followup and medication review. We reviewed all his medications and organized them into a medication calendar with patient education Seen 2 weeks ago with Dr. Melvyn Novas for second opinion related to persistent cough. He was given Levaquin 750 mg daily for 5 days a prednisone taper, and started on Dulera twice daily. He was also treated with Protonix, and Pepcid Returns today feeling that his breathing is doing well today; still taking 20mg  prednisone daily. Workup with CXR and CT sinus unrevealing.  He did not get mucinex DM as recommended and is using duoneb as scheduled med.  Overall  Cough is much better with significant drop in coughing but not totally cough free.  No hemoptyisis, chest pain , orthopnea, edema.  Did not taper prednisone as directed.  Last ov labs showed IGE elevated 707 , +rast , bnp nml. Cbc w/ no eosinophils elevation.   Current Medications, Allergies, Complete Past Medical  History, Past Surgical History, Family History, and Social History were reviewed in [employer] record.  ROS  The following are not active complaints unless bolded sore throat, dysphagia, dental problems, itching, sneezing,  nasal congestion or excess/ purulent secretions, ear ache,   fever, chills, sweats, unintended wt loss, pleuritic or exertional cp, hemoptysis,  orthopnea pnd or leg swelling, presyncope, palpitations, heartburn, abdominal pain, anorexia, nausea, vomiting, diarrhea  or change in bowel or urinary habits, change in stools or urine, dysuria,hematuria,  rash, arthralgias, visual complaints, headache, numbness weakness or ataxia or problems with walking or coordination,  change in mood/affect or memory.            Objective:  Physical Exam  Nursing note and vitals reviewed. Constitutional: He is oriented to person, place, and time. He appears well-developed and well-nourished. No distress.  HENT:  Head: Normocephalic and atraumatic.  Right Ear: External ear normal.  Left Ear: External ear normal.  Mouth/Throat: Oropharynx is clear and moist. No oropharyngeal exudate.  Eyes: Conjunctivae and EOM are normal. Pupils are equal, round, and reactive to light. Right eye exhibits no discharge. Left eye exhibits no discharge. No scleral icterus.  Neck: Normal range of motion. Neck supple. No JVD present. No tracheal deviation present. No thyromegaly present.  Cardiovascular: Normal rate, regular rhythm and intact distal pulses.  Exam reveals no gallop and no friction rub.   No murmur heard. Pulmonary/Chest : few scattered rhonchi  Abdominal: Soft. Bowel sounds are normal. He exhibits no distension and no mass. There is no tenderness. There is no rebound and no guarding.  Musculoskeletal: Normal range of motion. He exhibits no edema and no tenderness.  Lymphadenopathy:    He has no cervical adenopathy.  Neurological: He is alert and oriented to person,  place, and time. He has normal reflexes. No cranial nerve deficit. Coordination normal.  Skin: Skin is warm and dry. No rash noted. He is not diaphoretic. No erythema. No pallor.  Psychiatric: He has a normal mood and affect. His behavior is normal. Judgment and thought content normal.  0-tr  edema both LE's    CXR  03/04/2013 : Streaky left basilar atelectasis, scarring or infiltrate.    CT sinus 03/05/2013 Mild deviation of the nasal septum to the right. No evidence of sinusitis.      Assessment & Plan:

## 2013-03-18 NOTE — Assessment & Plan Note (Addendum)
Chronic cough with improvement, tx aimed at trigger prevention  With GERD and AR control  Labs showed high IgE w/ +rast  Improved on steroids will try to wean off with  Cough prevention regimen , GERD/AR trigger control  Patient's medications were reviewed today and patient education was given. Computerized medication calendar was adjusted/completed;l  Plan  Decrease prednisone 10mg  daily for 1 week then 1/2 daily for 1 week and stop.  Add Mucinex DM Twice daily  As needed  Cough.  Add flutter valve As needed  For cough Brush/rinse and gargle after inhaler use.  Follow med calendar closely and bring to each visit.  follow up Dr. Melvyn Novas  In 3 weeks and As needed   Change Duoneb to as needed only use. -this is rescue for wheezing/shortness of breath Goal is to not cough, use sugarless candy and water to help soothe throat  Avoid coughing and throat clearing  NO MINTS.

## 2013-03-18 NOTE — Patient Instructions (Addendum)
Decrease prednisone 10mg  daily for 1 week then 1/2 daily for 1 week and stop.  Add Mucinex DM Twice daily  As needed  Cough.  Add flutter valve As needed  For cough Brush/rinse and gargle after inhaler use.  Follow med calendar closely and bring to each visit.  follow up Dr. Melvyn Novas  In 3 weeks and As needed   Change Duoneb to as needed only use. -this is rescue for wheezing/shortness of breath Goal is to not cough, use sugarless candy and water to help soothe throat  Avoid coughing and throat clearing  NO MINTS.

## 2013-03-19 NOTE — Addendum Note (Signed)
Addended by: Parke Poisson E on: 03/19/2013 04:50 PM   Modules accepted: Orders, Medications

## 2013-03-23 ENCOUNTER — Telehealth: Payer: Self-pay | Admitting: Internal Medicine

## 2013-03-23 ENCOUNTER — Other Ambulatory Visit: Payer: Self-pay | Admitting: *Deleted

## 2013-03-23 ENCOUNTER — Telehealth: Payer: Self-pay | Admitting: Endocrinology

## 2013-03-23 MED ORDER — INSULIN LISPRO 100 UNIT/ML ~~LOC~~ SOLN: SUBCUTANEOUS | Status: DC

## 2013-03-23 NOTE — Telephone Encounter (Signed)
Increase Prednisone 20mg  daily for 1 week then alternate 20mg  and 10mg  daily until seen back in office  follow up in 3 weeks with Dr. Melvyn Novas  As planned and As needed   Please contact office for sooner follow up if symptoms do not improve or worsen or seek emergency care

## 2013-03-23 NOTE — Telephone Encounter (Signed)
I spoke with pt and is aware of recs. He voiced his understanding and needed nothing further 

## 2013-03-23 NOTE — Telephone Encounter (Signed)
I spoke with pt. He reports since cutting the prednisone back to 10 mg daily he has noticed he is not able to cough up any mucus. He feels his chest is congested, he is wheezing, and has dry cough. He is taking mucinex DM BID and using the flutter valve 2-3 times daily. PLease advise TP thanks  Allergies  Allergen Reactions  . Penicillins

## 2013-03-23 NOTE — Telephone Encounter (Signed)
rx sent

## 2013-03-24 ENCOUNTER — Telehealth: Payer: Self-pay | Admitting: Adult Health

## 2013-03-24 MED ORDER — PREDNISONE 10 MG PO TABS: ORAL_TABLET | ORAL | Status: DC

## 2013-03-24 NOTE — Telephone Encounter (Signed)
I spoke with pt and is aware of RX has been sent.

## 2013-03-27 ENCOUNTER — Other Ambulatory Visit: Payer: Self-pay | Admitting: *Deleted

## 2013-03-27 MED ORDER — INSULIN LISPRO 100 UNIT/ML ~~LOC~~ SOLN: SUBCUTANEOUS | Status: DC

## 2013-03-27 MED ORDER — INSULIN GLARGINE 100 UNIT/ML ~~LOC~~ SOLN: 32.0000 [IU] | Freq: Every day | SUBCUTANEOUS | Status: DC

## 2013-03-30 ENCOUNTER — Other Ambulatory Visit: Payer: Medicare PPO

## 2013-03-30 ENCOUNTER — Telehealth: Payer: Self-pay | Admitting: Endocrinology

## 2013-03-30 NOTE — Telephone Encounter (Signed)
Pts sugar is running high, last three days of readings are: 10/10 7:36, 193,fasting 11:52 331 6:26 253 10/11 9:07, 267 fasting, 11:42 322 7:25 74 10/12 8:54 300, 12:29 321, 6:36 354 10/13 258, lunch was 318, he says he's taking 16, 16 and 18, he increases it according to your instructions. Please advise

## 2013-03-30 NOTE — Telephone Encounter (Signed)
If he is taking Lantus 32 units daily will need to change the dose to 20 units twice a day and increased the NovoLog by 4 units at each meal, needs to see me back in followup on Friday or next week

## 2013-03-31 NOTE — Telephone Encounter (Signed)
Pt states he is taking an extra 6 units of novolog already and that you are cutting him back on that by saying to take 4 units.  He said he is almost finished with the Prednisone, he's tapering off now.

## 2013-03-31 NOTE — Telephone Encounter (Signed)
That is total of 10 more

## 2013-04-01 ENCOUNTER — Other Ambulatory Visit: Payer: Self-pay | Admitting: *Deleted

## 2013-04-01 ENCOUNTER — Other Ambulatory Visit (INDEPENDENT_AMBULATORY_CARE_PROVIDER_SITE_OTHER): Payer: Medicare PPO

## 2013-04-01 ENCOUNTER — Ambulatory Visit: Payer: Medicare PPO | Admitting: Endocrinology

## 2013-04-01 DIAGNOSIS — E119 Type 2 diabetes mellitus without complications: Secondary | ICD-10-CM

## 2013-04-01 LAB — COMPREHENSIVE METABOLIC PANEL
Albumin: 3.8 g/dL (ref 3.5–5.2)
Alkaline Phosphatase: 83 U/L (ref 39–117)
BUN: 34 mg/dL — ABNORMAL HIGH (ref 6–23)
CO2: 27 mEq/L (ref 19–32)
Calcium: 9.4 mg/dL (ref 8.4–10.5)
Chloride: 101 mEq/L (ref 96–112)
Glucose, Bld: 231 mg/dL — ABNORMAL HIGH (ref 70–99)
Potassium: 4.4 mEq/L (ref 3.5–5.1)

## 2013-04-01 LAB — URINALYSIS
Bilirubin Urine: NEGATIVE
Ketones, ur: NEGATIVE
Specific Gravity, Urine: 1.015 (ref 1.000–1.030)
Urine Glucose: 100
Urobilinogen, UA: 0.2 (ref 0.0–1.0)

## 2013-04-01 LAB — LIPID PANEL
Cholesterol: 177 mg/dL (ref 0–200)
HDL: 64.4 mg/dL (ref >39.00)
LDL Cholesterol: 95 mg/dL (ref 0–99)
Triglycerides: 89 mg/dL (ref 0.0–149.0)

## 2013-04-01 LAB — MICROALBUMIN / CREATININE URINE RATIO
Creatinine,U: 59.9 mg/dL
Microalb, Ur: 19.6 mg/dL — ABNORMAL HIGH (ref 0.0–1.9)

## 2013-04-03 ENCOUNTER — Ambulatory Visit (INDEPENDENT_AMBULATORY_CARE_PROVIDER_SITE_OTHER): Payer: Medicare PPO | Admitting: Endocrinology

## 2013-04-03 ENCOUNTER — Encounter: Payer: Self-pay | Admitting: Endocrinology

## 2013-04-03 VITALS — BP 122/52 | HR 96 | Temp 98.2°F | Resp 12 | Ht 70.5 in | Wt 235.1 lb

## 2013-04-03 DIAGNOSIS — E7800 Pure hypercholesterolemia, unspecified: Secondary | ICD-10-CM

## 2013-04-03 DIAGNOSIS — N183 Chronic kidney disease, stage 3 unspecified: Secondary | ICD-10-CM

## 2013-04-03 DIAGNOSIS — IMO0001 Reserved for inherently not codable concepts without codable children: Secondary | ICD-10-CM

## 2013-04-03 NOTE — Progress Notes (Signed)
Patient ID: Clayton [name], male   DOB: 1928/06/03, 79 y.o.   MRN: [id number]  Reason for Appointment: Diabetes follow-up   History of Present Illness   Diagnosis: Type 2 DIABETES MELITUS     Oral hypoglycemic drugs: Actos and previously metformin        Side effects from medications: None Insulin regimen: Humalog recently 20  units before meals, Lantus recently 20 units twice a day           He has had long-standing diabetes and has been on insulin for the last few years For several months he has required larger doses of insulin and more recently has had to increase his insulin doses significantly because of taking a long course of steroids for his cough. He has been adjusting his insulin as directed for high readings but his average glucose in the last 2 weeks is still high at 226 He thinks that since yesterday when his prednisone has been reduced to 10 mg alternating with 20 mg his glucose has come down to 80 Also yesterday he was able to exercise with biking which he has not done for a while  Proper timing of medications in relation to meals: Yes.         Monitors blood glucose:  3 times a day   Glucometer: One Touch.          Glucometer download reviewed: readings before breakfast: 131-258, was 80 this morning Midday 86-331, suppertime 67-354, no readings after supper    Hypoglycemia frequency:  none recently, lowest reading 67 at 6 PM       Meals: 3 meals per day.  breakfast: oatmeal, grits, some cheese        Physical activity: exercise: minimal recently but planning to start biking soon He will not take any Humalog if he is planning to be very active with biking             Complications: are: Nephropathy  HYPERTENSION:  this has been relatively mild and well controlled. Has not had any increased edema recently despite taking prednisone Currently not on any medications, previously was and diltiazem since he had worsening renal dysfunction with ARB drugs  HYPERLIPIDEMIA:          The lipid abnormality consists of  minimally elevated LDL, taking simvastatin for cardiovascular protection   LABS:  Lab Results  Component Value Date   HGBA1C 7.9* 04/01/2013   HGBA1C 7.5* 12/29/2012   HGBA1C 7.4* 06/20/2012   Lab Results  Component Value Date   MICROALBUR 19.6* 04/01/2013   [place] 95 04/01/2013   CREATININE 1.7* 04/01/2013    Appointment on 04/01/2013  Component Date Value Range Status  . Hemoglobin A1C 04/01/2013 7.9* 4.6 - 6.5 % Final   Glycemic Control Guidelines for People with Diabetes:Non Diabetic:  <6%Goal of Therapy: <7%Additional Action Suggested:  >8%   . Cholesterol 04/01/2013 177  0 - 200 mg/dL Final   ATP III Classification       Desirable:  < 200 mg/dL               Borderline High:  200 - 239 mg/dL          High:  > = 240 mg/dL  . Triglycerides 04/01/2013 89.0  0.0 - 149.0 mg/dL Final   Normal:  <150 mg/dLBorderline High:  150 - 199 mg/dL  . HDL 04/01/2013 64.40  >39.00 mg/dL Final  . VLDL 04/01/2013 17.8  0.0 - 40.0 mg/dL Final  .  LDL Cholesterol 04/01/2013 95  0 - 99 mg/dL Final  . Total CHOL/HDL Ratio 04/01/2013 3   Final                  Men          Women1/2 Average Risk     3.4          3.3Average Risk          5.0          4.42X Average Risk          9.6          7.13X Average Risk          15.0          11.0                      . Color, Urine 04/01/2013 LT. YELLOW  Yellow;Lt. Yellow Final  . APPearance 04/01/2013 CLEAR  Clear Final  . Specific Gravity, Urine 04/01/2013 1.015  1.000-1.030 Final  . pH 04/01/2013 6.0  5.0 - 8.0 Final  . Total Protein, Urine 04/01/2013 TRACE  Negative Final  . Urine Glucose 04/01/2013 100  Negative Final  . Ketones, ur 04/01/2013 NEGATIVE  Negative Final  . Bilirubin Urine 04/01/2013 NEGATIVE  Negative Final  . Hgb urine dipstick 04/01/2013 NEGATIVE  Negative Final  . Urobilinogen, UA 04/01/2013 0.2  0.0 - 1.0 Final  . Leukocytes, UA 04/01/2013 NEGATIVE  Negative Final  . Nitrite 04/01/2013 NEGATIVE   Negative Final  . Sodium 04/01/2013 138  135 - 145 mEq/L Final  . Potassium 04/01/2013 4.4  3.5 - 5.1 mEq/L Final  . Chloride 04/01/2013 101  96 - 112 mEq/L Final  . CO2 04/01/2013 27  19 - 32 mEq/L Final  . Glucose, Bld 04/01/2013 231* 70 - 99 mg/dL Final  . BUN 04/01/2013 34* 6 - 23 mg/dL Final  . Creatinine, Ser 04/01/2013 1.7* 0.4 - 1.5 mg/dL Final  . Total Bilirubin 04/01/2013 0.4  0.3 - 1.2 mg/dL Final  . Alkaline Phosphatase 04/01/2013 83  39 - 117 U/L Final  . AST 04/01/2013 22  0 - 37 U/L Final  . ALT 04/01/2013 23  0 - 53 U/L Final  . Total Protein 04/01/2013 7.2  6.0 - 8.3 g/dL Final  . Albumin 04/01/2013 3.8  3.5 - 5.2 g/dL Final  . Calcium 04/01/2013 9.4  8.4 - 10.5 mg/dL Final  . GFR 04/01/2013 40.82* >60.00 mL/min Final  . Microalb, Ur 04/01/2013 19.6* 0.0 - 1.9 mg/dL Final  . Creatinine,U 04/01/2013 59.9   Final  . Microalb Creat Ratio 04/01/2013 32.7* 0.0 - 30.0 mg/g Final      Medication List       This list is accurate as of: 04/03/13  8:33 AM.  Always use your most recent med list.               albuterol (2.5 MG/3ML) 0.083% NEBU 3 mL, albuterol (5 MG/ML) 0.5% NEBU 0.5 mL  Inhale into the lungs.     Azelastine HCl 0.15 % Soln  Place 1 spray into the nose at bedtime.     BIFERA 28 MG Tabs  Take 1 tablet by mouth daily.     diphenhydramine-acetaminophen 25-500 MG Tabs  Commonly known as:  TYLENOL PM  Take 2 tablets by mouth at bedtime as needed (insomnia).     docusate sodium 100 MG capsule  Commonly known as:  COLACE  2  capsules by mouth twice daily     DULERA 100-5 MCG/ACT Aero  Generic drug:  mometasone-formoterol  Inhale 2 puffs into the lungs 2 (two) times daily.     estazolam 2 MG tablet  Commonly known as:  PROSOM  Take 1 tablet (2 mg total) by mouth at bedtime.     famotidine 20 MG tablet  Commonly known as:  PEPCID  One at bedtime     finasteride 5 MG tablet  Commonly known as:  PROSCAR  Take 1 tablet (5 mg total) by mouth  daily.     fluticasone 50 MCG/ACT nasal spray  Commonly known as:  FLONASE  Place 1 spray into the nose every morning.     FLUTTER Devi  Use as directed.     freestyle lancets  Use to check blood sugars 4 times per day     furosemide 20 MG tablet  Commonly known as:  LASIX  Take 20 mg by mouth daily.     glucose blood test strip  Commonly known as:  FREESTYLE LITE  Use to check blood sugars 4 times per day     insulin glargine 100 UNIT/ML injection  Commonly known as:  LANTUS  Inject 40 Units into the skin daily with supper. Take 20 units before breakfast and supper     insulin lispro 100 UNIT/ML injection  Commonly known as:  HUMALOG  Take 20 units at breakfast and lunch lunch and supper and 22 units at dinner on sliding scale     ipratropium-albuterol 0.5-2.5 (3) MG/3ML Soln  Commonly known as:  DUONEB  Take 3 mLs by nebulization every 4 (four) hours as needed.     MUCINEX DM MAXIMUM STRENGTH 60-1200 MG Tb12  Take 1 tablet by mouth 2 (two) times daily as needed (with flutter valve).     multivitamin with minerals Tabs tablet  Take 1 tablet by mouth daily.     pantoprazole 40 MG tablet  Commonly known as:  PROTONIX  Take 1 tablet (40 mg total) by mouth daily. Take 30-60 min before first meal of the day     predniSONE 10 MG tablet  Commonly known as:  DELTASONE  Take 20 mg daily x 1 week, then alternate 10 mg and 20 mg every other day     simvastatin 20 MG tablet  Commonly known as:  ZOCOR  Take 1 tablet (20 mg total) by mouth every evening.     TOVIAZ 8 MG Tb24 tablet  Generic drug:  fesoterodine  Take 8 mg by mouth daily.     Vitamin D3 2000 UNITS Tabs  Take 1 capsule by mouth every morning.        Allergies:  Allergies  Allergen Reactions  . Penicillins     Past Medical History  Diagnosis Date  . Diabetes mellitus   . Hypertension   . Anemia   . Chronic renal insufficiency   . Osteoporosis   . Neuromuscular disorder   . DDD (degenerative  disc disease), lumbar     Past Surgical History  Procedure Laterality Date  . Vasectomy    . Eye surgery    . Video bronchoscopy Bilateral 08/25/2012    Procedure: VIDEO BRONCHOSCOPY WITHOUT FLUORO;  Surgeon: Brand Males, MD;  Location: [facility];  Service: Cardiopulmonary;  Laterality: Bilateral;    Family History  Problem Relation Age of Onset  . Diabetes Mellitus II    . Hypertension      Social History:  reports that  he quit smoking about 39 years ago. His smoking use included Cigarettes. He has a 99 pack-year smoking history. He quit smokeless tobacco use about 37 years ago. He reports that he does not drink alcohol or use illicit drugs.  Review of Systems -   He still has some cough but getting better with a longer course of prednisone. He is being followed by pulmonologist.   Previous hypertension: Currently not requiring any medications  He thinks his leg swelling is less with stopping Actos Still taking Lasix daily however  He has had lower urinary tract problems and BPH, currently taking multiple medications from urologist   Examination:   BP 122/52  Pulse 96  Temp(Src) 98.2 F (36.8 C)  Resp 12  Ht 5' 10.5" (1.791 m)  Wt 235 lb 1.6 oz (106.641 kg)  BMI 33.25 kg/m2  SpO2 94%  Body mass index is 33.25 kg/(m^2).   No pedal edema  Assesment/PLAN:   1. Diabetes type 2, uncontrolled - 250.02  The patient's diabetes control appears to be  much worse because of taking prednisone. Unable to get adequate control even with increasing his insulin However since his dose has been reduced this week his blood sugars apparently are around 80 Advised him to start reducing his insulin doses especially in the evening to avoid hypoglycemia Specific guidelines given on how to reduce insulin Also he has started exercising which should help  2. Diabetic nephropathy: Edema is well-controlled, appears to have only minimal microalbuminuria now Creatinine is still  relatively high at 1.7, may be mild volume depleted Potassium is normal Will stop his Lasix especially since he is tapering off his prednisone  3. History of anemia: Last hemoglobin was 12 and will need to followup on the next visit  4. Persistent cough: Appears to be improving with steroids, continue followup with pulmonologist  Counseling time over 50% of his 25 minute visit  Shamell Suarez  04/03/2013, 8:33 AM

## 2013-04-03 NOTE — Patient Instructions (Addendum)
Take Lasix only if swelling at ankles  Reduce PM Lantus to 16 for now and further if am sugar < 130  Humalog 16 at supper instead of 20 at supper  Keep reducing all doses by 4-6 units when sugars are < 130

## 2013-04-06 DIAGNOSIS — E1169 Type 2 diabetes mellitus with other specified complication: Secondary | ICD-10-CM | POA: Insufficient documentation

## 2013-04-08 ENCOUNTER — Encounter: Payer: Self-pay | Admitting: Internal Medicine

## 2013-04-08 ENCOUNTER — Ambulatory Visit (INDEPENDENT_AMBULATORY_CARE_PROVIDER_SITE_OTHER): Payer: Medicare PPO | Admitting: Internal Medicine

## 2013-04-08 VITALS — BP 124/58 | HR 98 | Temp 97.5°F | Ht 71.0 in | Wt 236.4 lb

## 2013-04-08 DIAGNOSIS — R0609 Other forms of dyspnea: Secondary | ICD-10-CM

## 2013-04-08 DIAGNOSIS — R0600 Dyspnea, unspecified: Secondary | ICD-10-CM

## 2013-04-08 DIAGNOSIS — R053 Chronic cough: Secondary | ICD-10-CM

## 2013-04-08 DIAGNOSIS — R05 Cough: Secondary | ICD-10-CM

## 2013-04-08 DIAGNOSIS — R059 Cough, unspecified: Secondary | ICD-10-CM

## 2013-04-08 MED ORDER — MONTELUKAST SODIUM 10 MG PO TABS: ORAL_TABLET | ORAL | Status: DC

## 2013-04-08 MED ORDER — PREDNISONE 10 MG PO TABS: ORAL_TABLET | ORAL | Status: DC

## 2013-04-08 NOTE — Patient Instructions (Addendum)
Increase dulera to 200 Take 2 puffs first thing in am and then another 2 puffs about 12 hours later.  Add singulair 10 mg one each bedtime Use mucinex dm and flutter valve as per calendar  Please schedule a follow up office visit in 2 weeks, sooner if needed bring all medication and pill boxes

## 2013-04-08 NOTE — Progress Notes (Signed)
Subjective:    Patient ID: Clayton [name], male    DOB: 24-May-1928    MRN: [id number]  HPI PCP Lujean Amel, MD   HPI #Followup  - Influenza related acute ventilatory respiratory failure on the ventilator and post influenza MRSA pneumonia - January 2014  - False positive Non-small cell lung cancer detected on an endobronchial mucous plug during acute ICU stay January 2014  - deemed false positive based on - PET scan March 2014 and normal bronch March 2014 and 2nd opinion Jan 2014 path by DR Dian Situ  - Chronic cough that predates above  - PFT 09/02/12: His pulmonary function test today is essentially normal except for reduced forced vital capacity, 9% bronchodilator response with FEV1 and in reduced small airway function. Specifically, FEV1 is 2.1 L/80%. FVC 3.1 L/73%. Bronchodilator response FEV1 is 9%. Small airways is 60%. Total lung capacity is 83%. DLCO is 85%.  - March 2014: Normal bronchoscopy   OV 10/13/2012  Follow for chronic cough.  -At last visit on 08/29/2012 started him on a 2 part, nasal steroid, over-the-counter proton pump inhibitor and asked him to continue on DuoNeb and Qvar. In addition started gabapentin and referred him to speech therapy. With these interventions cough is on 50% better and RSI cough score is reduced from 16 to 9. However, he and his daughter Clayton [name] are not happy with the level of improvement. Diffusing quality of life is impaired because he's unable to attend occasional social functions and they feel cough is a social embarrassment. They admit to significant gagging and tearing of the throat. Of note, he did and speech therapy for classes but since has been discharged from followup  - In terms of the supposed to lung cancer got an e-mail from Dr. Dian Situ who feels that his pathology but did false positive atypia and that she is seen cases like this and patient to be acutely ill.  REC #Sleep apnea  -Room air cpap to continue  #Lung cancer -  No evidence of lung cancer as of April 2014 - Pathologist in [place] says likely reaction to acute infection - Followup CT chest 1 year   #Chronic cough Cough is from sinus drainage, possible acid reflux, possible asthma,  All of this is working together to cause cyclical cough/LPR cough otherwise for irritable larynx  #Sinus drainage  - continue netti pot daily  or 3% hypertonic saline nasal spray -  Continue nasal steroid generic fluticasone inhaler 2 squirts each nostril daily as advised  - start asteprol nasal inhaler day time 2 squirts - ENT consult depending on course      #Acute bronchitis  - Take doxycycline 100mg  po twice daily x 5 days; take after meals and avoid sunlight  -Take prednisone 40 mg daily x 2 days, then 20mg  daily x 2 days, then 10mg  daily x 2 days, then 5mg  daily x 2 days and stop  - give sputum for gram stain and culture; will call with results  #Chronic cough  - so far no better and gabapentin not helping  - slow stop gabapentin over 3 weeks  - continue nasal inhalers and oral inhalers for lung  - see ENT physician for chronic cough/.sinus and hearing issues as well  #Followup  2 months with cough score   OV 02/11/2013 qvar not helping so stopped.  Nebs help but Cough overall a bother. RSI score 19 and shows lack of improvement. He is frustrated by the lack of improvement in his cough. Neurontin  not helping. Nasal inhalers helping only partially He shows me each visit take brown sputum. All features upon chronic bronchitis but he really wants relief from his cough. He is open to a second opinion rec  #Chronic cough  - so far no better despite all measures I have tried  - stop QVAR due to lack of relief -continue nebs and other nasal treatment  #Lung nodule  - next scan needed Jan 2015  #Followup Dr Melvyn Novas for 2nd opinion on cough Return to see me JAn 2015 with CT chest   03/04/2013 Clayton [name]/ Consultation/ second opinion:   re persistent cough ever  since the flu requiring ET Chief Complaint  Patient presents with  . Pulmonary Consult    Referred per MR for second opinion on cough. Pt c/o cough since Jan 2014. Cough is prod with moderate pale yellow sputum.  He states that there is no specific thing that triggers the cough. He c/o increased SOB and leg cramps for the past wk. Using neb txs approx 3 times per day.    cough daily ever since tube pulled with waxes and wanes some s pattern x better p neb treatment always productive up to 2 tbsp per day thick yellow mucus >>Levaquin 750 mg one daily x 5 days Prednisone 10 mg take  4 each am x 2 days,   2 each am x 2 days,  1 each am x 2 days and stop  For cough mucinex dm 1200 mg every 12 hours use your flutter ,dulera 100 Take 2 puffs first thing in am and then another 2 puffs about 12 hours later -work on technique Pantoprazole (protonix) 40 mg   Take 30-60 min before first meal of the day and Pepcid 20 mg one bedtime until return to office -    03/18/2013 Follow up and med review  Patient returns for a followup and medication review. We reviewed all his medications and organized them into a medication calendar with patient education Seen 2 weeks ago with Dr. Melvyn Novas for second opinion related to persistent cough. He was given Levaquin 750 mg daily for 5 days a prednisone taper, and started on Dulera twice daily. He was also treated with Protonix, and Pepcid Returns today feeling that his breathing is doing well today; still taking 20mg  prednisone daily. Workup with CXR and CT sinus unrevealing.  He did not get mucinex DM as recommended and is using duoneb as scheduled med.  Overall  Cough is much better with significant drop in coughing but not totally cough free.  No hemoptyisis, chest pain , orthopnea, edema.  Did not taper prednisone as directed.  Last ov labs showed IGE elevated 707 , +rast , bnp nml. Cbc w/ no eosinophils elevation.  rec Decrease prednisone 10mg  daily for 1 week then 1/2  daily for 1 week and stop > found did better on pred 20 (75%) Add Mucinex DM Twice daily  As needed  Cough.  Add flutter valve As needed  For cough Brush/rinse and gargle after inhaler use.  Follow med calendar closely and bring to each visit.  follow up Dr. Melvyn Novas  In 3 weeks and As needed     04/08/2013 f/u ov/Clayton [name] re: chronic cough > sob  since Jan 2014 extubation  Chief Complaint  Patient presents with  . Follow-up    Pt states that his breathing seems to be doing some better. His cough is more prod with pale yellow sputum- esp on days when only takes 10  m pred daily.     Not clear he's following med calendar "I have my own system"  No obvious day to day or daytime variabilty or assoc   cp or chest tightness, subjective wheeze overt sinus or hb symptoms. No unusual exp hx or h/o childhood pna/ asthma or knowledge of premature birth.    Also denies any obvious fluctuation of symptoms with weather or environmental changes or other aggravating or alleviating factors except as outlined above   Current Medications, Allergies, Complete Past Medical History, Past Surgical History, Family History, and Social History were reviewed in [employer] record.  ROS  The following are not active complaints unless bolded sore throat, dysphagia, dental problems, itching, sneezing,  nasal congestion or excess/ purulent secretions, ear ache,   fever, chills, sweats, unintended wt loss, pleuritic or exertional cp, hemoptysis,  orthopnea pnd or leg swelling, presyncope, palpitations, heartburn, abdominal pain, anorexia, nausea, vomiting, diarrhea  or change in bowel or urinary habits, change in stools or urine, dysuria,hematuria,  rash, arthralgias, visual complaints, headache, numbness weakness or ataxia or problems with walking or coordination,  change in mood/affect or memory.            Objective:   Physical Exam  Nursing note and vitals reviewed. Constitutional: He is oriented  to person, place, and time. He appears well-developed and well-nourished. No distress.  HENT:  Head: Normocephalic and atraumatic.  Right Ear: External ear normal.  Left Ear: External ear normal.  Mouth/Throat: Oropharynx is clear and moist. No oropharyngeal exudate.  Eyes: Conjunctivae and EOM are normal. Pupils are equal, round, and reactive to light. Right eye exhibits no discharge. Left eye exhibits no discharge. No scleral icterus.  Neck: Normal range of motion. Neck supple. No JVD present. No tracheal deviation present. No thyromegaly present.  Cardiovascular: Normal rate, regular rhythm and intact distal pulses.  Exam reveals no gallop and no friction rub.   No murmur heard. Pulmonary/Chest : few scattered rhonchi  Abdominal: Soft. Bowel sounds are normal. He exhibits no distension and no mass. There is no tenderness. There is no rebound and no guarding.  Musculoskeletal: Normal range of motion. He exhibits no edema and no tenderness.  Lymphadenopathy:    He has no cervical adenopathy.  Neurological: He is alert and oriented to person, place, and time. He has normal reflexes. No cranial nerve deficit. Coordination normal.  Skin: Skin is warm and dry. No rash noted. He is not diaphoretic. No erythema. No pallor.  Psychiatric: He has a normal mood and affect. His behavior is normal. Judgment and thought content normal.  0-tr  edema both LE's    CXR  03/04/2013 : Streaky left basilar atelectasis, scarring or infiltrate.    CT sinus 03/05/2013 Mild deviation of the nasal septum to the right. No evidence of sinusitis.      Assessment & Plan:

## 2013-04-09 ENCOUNTER — Encounter: Payer: Self-pay | Admitting: Internal Medicine

## 2013-04-09 NOTE — Assessment & Plan Note (Addendum)
-   Sinus CT 03/05/2013 > Mild deviation of the nasal septum to the right. No evidence of  Sinusitis. - Allergy profile 03/05/13 > IgE 707 mulitple resp allergens identified including dogs/cats/ragweed/ trees - Prednisone daily started 06/18/12     Steroid responsive suggests asthma vs eos bronchitis > add singulair and max rx with dulera to 200 bid and titrate prednisone down /off as tolerates    Each maintenance medication was reviewed in detail including most importantly the difference between maintenance and as needed and under what circumstances the prns are to be used. This was done in the context of a medication calendar review which provided the patient with a user-friendly unambiguous mechanism for medication administration and reconciliation and provides an action plan for all active problems. It is critical that this be shown to every doctor  for modification during the office visit if necessary so the patient can use it as a working document.

## 2013-04-22 ENCOUNTER — Encounter (INDEPENDENT_AMBULATORY_CARE_PROVIDER_SITE_OTHER): Payer: Self-pay

## 2013-04-22 ENCOUNTER — Ambulatory Visit (INDEPENDENT_AMBULATORY_CARE_PROVIDER_SITE_OTHER): Payer: Medicare PPO | Admitting: Internal Medicine

## 2013-04-22 ENCOUNTER — Encounter: Payer: Self-pay | Admitting: Internal Medicine

## 2013-04-22 VITALS — BP 114/70 | HR 92 | Temp 97.8°F | Ht 71.0 in | Wt 236.4 lb

## 2013-04-22 DIAGNOSIS — J45991 Cough variant asthma: Secondary | ICD-10-CM

## 2013-04-22 NOTE — Patient Instructions (Addendum)
Work on inhaler technique:  relax and gently blow all the way out then take a nice smooth deep breath back in, triggering the inhaler at same time you start breathing in.  Hold for up to 5 seconds if you can.  Rinse and gargle with water when done  Prednisone 2 with breakfast until breathing and coughing  better then one daily thereafter  See Tammy NP w/in 2 weeks with all your medications, even over the counter meds, separated in two separate bags, the ones you take no matter what vs the ones you stop once you feel better and take only as needed when you feel you need them.   Tammy  will generate for you a new user friendly medication calendar that will put [place] all on the same page re: your medication use.     Without this process, it simply isn't possible to assure that we are providing  your outpatient care  with  the attention to detail we feel you deserve.   If we cannot assure that you're getting that kind of care,  then we cannot manage your problem effectively from this clinic.  Once you have seen Tammy and we are sure that we're all on the same page with your medication use she will arrange follow up with me.

## 2013-04-22 NOTE — Progress Notes (Signed)
Subjective:    Patient ID: Clayton [name], male    DOB: [birth year]    MRN: [id number]  HPI PCP Lujean Amel, MD   HPI #Followup  - Influenza related acute ventilatory respiratory failure on the ventilator and post influenza MRSA pneumonia - January 2014  - False positive Non-small cell lung cancer detected on an endobronchial mucous plug during acute ICU stay January 2014  - deemed false positive based on - PET scan March 2014 and normal bronch March 2014 and 2nd opinion Jan 2014 path by DR Dian Situ  - Chronic cough that predates above  - PFT 09/02/12: His pulmonary function test today is essentially normal except for reduced forced vital capacity, 9% bronchodilator response with FEV1 and in reduced small airway function. Specifically, FEV1 is 2.1 L/80%. FVC 3.1 L/73%. Bronchodilator response FEV1 is 9%. Small airways is 60%. Total lung capacity is 83%. DLCO is 85%.  - March 2014: Normal bronchoscopy   OV 10/13/2012  Follow for chronic cough.  -At last visit on 08/29/2012 started him on a 2 part, nasal steroid, over-the-counter proton pump inhibitor and asked him to continue on DuoNeb and Qvar. In addition started gabapentin and referred him to speech therapy. With these interventions cough is on 50% better and RSI cough score is reduced from 16 to 9. However, he and his [name] are not happy with the level of improvement. Diffusing quality of life is impaired because he's unable to attend occasional social functions and they feel cough is a social embarrassment. They admit to significant gagging and tearing of the throat. Of note, he did and speech therapy for classes but since has been discharged from followup  - In terms of the supposed to lung cancer got an e-mail from Dr. Dian Situ who feels that his pathology but did false positive atypia and that she is seen cases like this and patient to be acutely ill.  REC #Sleep apnea  -Room air cpap to continue  #Lung cancer -  No evidence of lung cancer as of April 2014 - Pathologist in [place] says likely reaction to acute infection - Followup CT chest 1 year   #Chronic cough Cough is from sinus drainage, possible acid reflux, possible asthma,  All of this is working together to cause cyclical cough/LPR cough otherwise for irritable larynx  #Sinus drainage  - continue netti pot daily  or 3% hypertonic saline nasal spray -  Continue nasal steroid generic fluticasone inhaler 2 squirts each nostril daily as advised  - start asteprol nasal inhaler day time 2 squirts - ENT consult depending on course      #Acute bronchitis  - Take doxycycline 100mg  po twice daily x 5 days; take after meals and avoid sunlight  -Take prednisone 40 mg daily x 2 days, then 20mg  daily x 2 days, then 10mg  daily x 2 days, then 5mg  daily x 2 days and stop  - give sputum for gram stain and culture; will call with results  #Chronic cough  - so far no better and gabapentin not helping  - slow stop gabapentin over 3 weeks  - continue nasal inhalers and oral inhalers for lung  - see ENT physician for chronic cough/.sinus and hearing issues as well  #Followup  2 months with cough score   OV 02/11/2013 qvar not helping so stopped.  Nebs help but Cough overall a bother. RSI score 19 and shows lack of improvement. He is frustrated by the lack of improvement in his cough. Neurontin  not helping. Nasal inhalers helping only partially He shows me each visit take brown sputum. All features upon chronic bronchitis but he really wants relief from his cough. He is open to a second opinion rec  #Chronic cough  - so far no better despite all measures I have tried  - stop QVAR due to lack of relief -continue nebs and other nasal treatment  #Lung nodule  - next scan needed Jan 2015  #Followup Dr Melvyn Novas for 2nd opinion on cough Return to see me JAn 2015 with CT chest   03/04/2013 Kona Lover/ Consultation/ second opinion:   re persistent cough ever  since the flu requiring ET Chief Complaint  Patient presents with  . Pulmonary Consult    Referred per MR for second opinion on cough. Pt c/o cough since Jan 2014. Cough is prod with moderate pale yellow sputum.  He states that there is no specific thing that triggers the cough. He c/o increased SOB and leg cramps for the past wk. Using neb txs approx 3 times per day.    cough daily ever since tube pulled with waxes and wanes some s pattern x better p neb treatment always productive up to 2 tbsp per day thick yellow mucus >>Levaquin 750 mg one daily x 5 days Prednisone 10 mg take  4 each am x 2 days,   2 each am x 2 days,  1 each am x 2 days and stop  For cough mucinex dm 1200 mg every 12 hours use your flutter ,dulera 100 Take 2 puffs first thing in am and then another 2 puffs about 12 hours later -work on technique Pantoprazole (protonix) 40 mg   Take 30-60 min before first meal of the day and Pepcid 20 mg one bedtime until return to office -    03/18/2013 Follow up and med review  Patient returns for a followup and medication review. We reviewed all his medications and organized them into a medication calendar with patient education Seen 2 weeks ago with Dr. Melvyn Novas for second opinion related to persistent cough. He was given Levaquin 750 mg daily for 5 days a prednisone taper, and started on Dulera twice daily. He was also treated with Protonix, and Pepcid Returns today feeling that his breathing is doing well today; still taking 20mg  prednisone daily. Workup with CXR and CT sinus unrevealing.  He did not get mucinex DM as recommended and is using duoneb as scheduled med.  Overall  Cough is much better with significant drop in coughing but not totally cough free.  No hemoptyisis, chest pain , orthopnea, edema.  Did not taper prednisone as directed.  Last ov labs showed IGE elevated 707 , +rast , bnp nml. Cbc w/ no eosinophils elevation.  rec Decrease prednisone 10mg  daily for 1 week then 1/2  daily for 1 week and stop > found did better on pred 20 (75%) Add Mucinex DM Twice daily  As needed  Cough.  Add flutter valve As needed  For cough Brush/rinse and gargle after inhaler use.  Follow med calendar closely and bring to each visit.  follow up Dr. Melvyn Novas  In 3 weeks and As needed     04/08/2013 f/u ov/Zhuri Krass re: chronic cough > sob  since Jan 2014 extubation  Chief Complaint  Patient presents with  . Follow-up    Pt states that his breathing seems to be doing some better. His cough is more prod with pale yellow sputum- esp on days when only takes 10  m pred daily.     Not clear he's following med calendar "I have my own system" rec Increase dulera to 200 Take 2 puffs first thing in am and then another 2 puffs about 12 hours later.  Add singulair 10 mg one each bedtime Use mucinex dm and flutter valve as per calendar   04/22/2013 f/u ov/Ashutosh Dieguez re: chronic cough with marked elevation of IgE c/w cough variant asthma Chief Complaint  Patient presents with  . Follow-up    Breathing is slightly better. Productive cough pale yellow phlem. No wheezing. No chest tx   Organization of meds in bags but not correlating with calendar - not sure he's following any of the instructions given.   Typically does respond somewhat to short courses of prednisone.  No obvious day to day or daytime variabilty or assoc   cp or chest tightness, subjective wheeze overt sinus or hb symptoms. No unusual exp hx or h/o childhood pna/ asthma or knowledge of premature birth.    Also denies any obvious fluctuation of symptoms with weather or environmental changes or other aggravating or alleviating factors except as outlined above   Current Medications, Allergies, Complete Past Medical History, Past Surgical History, Family History, and Social History were reviewed in [employer] record.  ROS  The following are not active complaints unless bolded sore throat, dysphagia, dental problems,  itching, sneezing,  nasal congestion or excess/ purulent secretions, ear ache,   fever, chills, sweats, unintended wt loss, pleuritic or exertional cp, hemoptysis,  orthopnea pnd or leg swelling, presyncope, palpitations, heartburn, abdominal pain, anorexia, nausea, vomiting, diarrhea  or change in bowel or urinary habits, change in stools or urine, dysuria,hematuria,  rash, arthralgias, visual complaints, headache, numbness weakness or ataxia or problems with walking or coordination,  change in mood/affect or memory.            Objective:   Physical Exam    Wt Readings from Last 3 Encounters:  04/22/13 236 lb 6.4 oz (107.23 kg)  04/08/13 236 lb 6.4 oz (107.23 kg)  04/03/13 235 lb 1.6 oz (106.641 kg)      Constitutional: He is oriented to person, place, and time. He appears well-developed and well-nourished. No distress.  HENT:  Head: Normocephalic and atraumatic.  Right Ear: External ear normal.  Left Ear: External ear normal.  Mouth/Throat: Oropharynx is clear and moist. No oropharyngeal exudate.  Eyes: Conjunctivae and EOM are normal. Pupils are equal, round, and reactive to light. Right eye exhibits no discharge. Left eye exhibits no discharge. No scleral icterus.  Neck: Normal range of motion. Neck supple. No JVD present. No tracheal deviation present. No thyromegaly present.  Cardiovascular: Normal rate, regular rhythm and intact distal pulses.  Exam reveals no gallop and no friction rub.   No murmur heard. Pulmonary/Chest : few scattered exp rhonchi bilaterally Abdominal: Soft. Bowel sounds are normal. He exhibits no distension and no mass. There is no tenderness. There is no rebound and no guarding.  Musculoskeletal: Normal range of motion. He exhibits no edema and no tenderness.  Lymphadenopathy:    He has no cervical adenopathy.  Neurological: He is alert and oriented to person, place, and time. He has normal reflexes. No cranial nerve deficit. Coordination normal.  Skin:  Skin is warm and dry. No rash noted. He is not diaphoretic. No erythema. No pallor.  Psychiatric: He has a normal mood and affect. His behavior is normal. Judgment and thought content normal.  CXR  03/04/2013 : Streaky left basilar atelectasis, scarring or infiltrate.    CT sinus 03/05/2013 Mild deviation of the nasal septum to the right. No evidence of sinusitis.      Assessment & Plan:

## 2013-04-24 NOTE — Assessment & Plan Note (Signed)
-   Sinus CT 03/05/2013 > Mild deviation of the nasal septum to the right. No evidence of  Sinusitis. - Allergy profile 03/05/13 > IgE 707 mulitple resp allergens identified including dogs/cats/ragweed/ trees - Prednisone daily started 06/18/12  - Singulair 04/08/13     Given refractory nature of cough and marked elevation of eos rec trial of more longterm prednisone until we have have achieved full and accurate med reconciliation.  Discussed in detail all the  indications, usual  risks and alternatives  relative to the benefits with patient who agrees to proceed with daily pred rx using the lowest dose that works

## 2013-04-28 ENCOUNTER — Telehealth: Payer: Self-pay | Admitting: Internal Medicine

## 2013-04-28 MED ORDER — MOMETASONE FURO-FORMOTEROL FUM 100-5 MCG/ACT IN AERO: 2.0000 | INHALATION_SPRAY | Freq: Two times a day (BID) | RESPIRATORY_TRACT | Status: DC

## 2013-04-28 NOTE — Telephone Encounter (Signed)
i have left 1 sample of dulera upfront for pick up. Pt aware

## 2013-05-13 ENCOUNTER — Encounter: Payer: Self-pay | Admitting: Adult Health

## 2013-05-13 ENCOUNTER — Ambulatory Visit (INDEPENDENT_AMBULATORY_CARE_PROVIDER_SITE_OTHER): Payer: Medicare PPO | Admitting: Adult Health

## 2013-05-13 VITALS — BP 144/62 | HR 100 | Temp 97.6°F | Ht 70.75 in | Wt 232.6 lb

## 2013-05-13 DIAGNOSIS — J45991 Cough variant asthma: Secondary | ICD-10-CM

## 2013-05-13 NOTE — Assessment & Plan Note (Signed)
Difficult to control with known High IGE requiring daily steroids  CT sinus neg. CXR with left basilar atx.  No significant change with higher dose steroids  Will try to maximize GERD/AR /cough prevention  Decrease steroids as he is DM If not improved on return consider d/c singulair.  Patient's medications were reviewed today and patient education was given. Computerized medication calendar was adjusted/completed   Plan  Add Chlorpheniramine 4mg  2 tabs At bedtime   Change to Mucinex 1-2 Twice daily  As needed  Cough/congestion  Add Delsym 2 tsp Twice daily  As needed  Cough  Decrease Prednisone 10mg  daily  Brush/rinse and gargle after inhaler use.  Follow med calendar closely and bring to each visit.  Follow up Dr. Melvyn Novas  In 4 weeks and As needed   Goal is to not cough, use sugarless candy and water to help soothe throat  Avoid coughing and throat clearing  NO MINTS.

## 2013-05-13 NOTE — Patient Instructions (Addendum)
Add Chlorpheniramine 4mg  2 tabs At bedtime   Change to Mucinex 1-2 Twice daily  As needed  Cough/congestion  Add Delsym 2 tsp Twice daily  As needed  Cough  Decrease Prednisone 10mg  daily  Brush/rinse and gargle after inhaler use.  Follow med calendar closely and bring to each visit.  Follow up Dr. Melvyn Novas  In 4 weeks and As needed   Goal is to not cough, use sugarless candy and water to help soothe throat  Avoid coughing and throat clearing  NO MINTS.

## 2013-05-13 NOTE — Progress Notes (Signed)
Subjective:    Patient ID: Clayton [name], male    DOB: 05/31/1928    MRN: [id number]  HPI PCP Lujean Amel, MD HPI #Followup  - Influenza related acute ventilatory respiratory failure on the ventilator and post influenza MRSA pneumonia - January 2014  - False positive Non-small cell lung cancer detected on an endobronchial mucous plug during acute ICU stay January 2014  - deemed false positive based on - PET scan March 2014 and normal bronch March 2014 and 2nd opinion Jan 2014 path by DR Dian Situ  - Chronic cough that predates above  - PFT 09/02/12: His pulmonary function test today is essentially normal except for reduced forced vital capacity, 9% bronchodilator response with FEV1 and in reduced small airway function. Specifically, FEV1 is 2.1 L/80%. FVC 3.1 L/73%. Bronchodilator response FEV1 is 9%. Small airways is 60%. Total lung capacity is 83%. DLCO is 85%.  - March 2014: Normal bronchoscopy   OV 10/13/2012  Follow for chronic cough.  -At last visit on 08/29/2012 started him on a 2 part, nasal steroid, over-the-counter proton pump inhibitor and asked him to continue on DuoNeb and Qvar. In addition started gabapentin and referred him to speech therapy. With these interventions cough is on 50% better and RSI cough score is reduced from 16 to 9. However, he and his [name] are not happy with the level of improvement. Diffusing quality of life is impaired because he's unable to attend occasional social functions and they feel cough is a social embarrassment. They admit to significant gagging and tearing of the throat. Of note, he did and speech therapy for classes but since has been discharged from followup  - In terms of the supposed to lung cancer got an e-mail from Dr. Dian Situ who feels that his pathology but did false positive atypia and that she is seen cases like this and patient to be acutely ill.  REC #Sleep apnea  -Room air cpap to continue  #Lung cancer - No  evidence of lung cancer as of April 2014 - Pathologist in [place] says likely reaction to acute infection - Followup CT chest 1 year   #Chronic cough Cough is from sinus drainage, possible acid reflux, possible asthma,  All of this is working together to cause cyclical cough/LPR cough otherwise for irritable larynx  #Sinus drainage  - continue netti pot daily  or 3% hypertonic saline nasal spray -  Continue nasal steroid generic fluticasone inhaler 2 squirts each nostril daily as advised  - start asteprol nasal inhaler day time 2 squirts - ENT consult depending on course      #Acute bronchitis  - Take doxycycline 100mg  po twice daily x 5 days; take after meals and avoid sunlight  -Take prednisone 40 mg daily x 2 days, then 20mg  daily x 2 days, then 10mg  daily x 2 days, then 5mg  daily x 2 days and stop  - give sputum for gram stain and culture; will call with results  #Chronic cough  - so far no better and gabapentin not helping  - slow stop gabapentin over 3 weeks  - continue nasal inhalers and oral inhalers for lung  - see ENT physician for chronic cough/.sinus and hearing issues as well  #Followup  2 months with cough score   OV 02/11/2013 qvar not helping so stopped.  Nebs help but Cough overall a bother. RSI score 19 and shows lack of improvement. He is frustrated by the lack of improvement in his cough. Neurontin not helping.  Nasal inhalers helping only partially He shows me each visit take brown sputum. All features upon chronic bronchitis but he really wants relief from his cough. He is open to a second opinion rec  #Chronic cough  - so far no better despite all measures I have tried  - stop QVAR due to lack of relief -continue nebs and other nasal treatment  #Lung nodule  - next scan needed Jan 2015  #Followup Dr Melvyn Novas for 2nd opinion on cough Return to see me JAn 2015 with CT chest   03/04/2013 Wert/ Consultation/ second opinion:   re persistent cough ever since  the flu requiring ET Chief Complaint  Patient presents with  . Pulmonary Consult    Referred per MR for second opinion on cough. Pt c/o cough since Jan 2014. Cough is prod with moderate pale yellow sputum.  He states that there is no specific thing that triggers the cough. He c/o increased SOB and leg cramps for the past wk. Using neb txs approx 3 times per day.    cough daily ever since tube pulled with waxes and wanes some s pattern x better p neb treatment always productive up to 2 tbsp per day thick yellow mucus >>Levaquin 750 mg one daily x 5 days Prednisone 10 mg take  4 each am x 2 days,   2 each am x 2 days,  1 each am x 2 days and stop  For cough mucinex dm 1200 mg every 12 hours use your flutter ,dulera 100 Take 2 puffs first thing in am and then another 2 puffs about 12 hours later -work on technique Pantoprazole (protonix) 40 mg   Take 30-60 min before first meal of the day and Pepcid 20 mg one bedtime until return to office -    03/18/2013 Follow up and med review  Patient returns for a followup and medication review. We reviewed all his medications and organized them into a medication calendar with patient education Seen 2 weeks ago with Dr. Melvyn Novas for second opinion related to persistent cough. He was given Levaquin 750 mg daily for 5 days a prednisone taper, and started on Dulera twice daily. He was also treated with Protonix, and Pepcid Returns today feeling that his breathing is doing well today; still taking 20mg  prednisone daily. Workup with CXR and CT sinus unrevealing.  He did not get mucinex DM as recommended and is using duoneb as scheduled med.  Overall  Cough is much better with significant drop in coughing but not totally cough free.  No hemoptyisis, chest pain , orthopnea, edema.  Did not taper prednisone as directed.  Last ov labs showed IGE elevated 707 , +rast , bnp nml. Cbc w/ no eosinophils elevation.  rec Decrease prednisone 10mg  daily for 1 week then 1/2 daily  for 1 week and stop > found did better on pred 20 (75%) Add Mucinex DM Twice daily  As needed  Cough.  Add flutter valve As needed  For cough Brush/rinse and gargle after inhaler use.  Follow med calendar closely and bring to each visit.  follow up Dr. Melvyn Novas  In 3 weeks and As needed     04/08/2013 f/u ov/Wert re: chronic cough > sob  since Jan 2014 extubation  Chief Complaint  Patient presents with  . Follow-up    Pt states that his breathing seems to be doing some better. His cough is more prod with pale yellow sputum- esp on days when only takes 10 m pred  daily.     Not clear he's following med calendar "I have my own system" rec Increase dulera to 200 Take 2 puffs first thing in am and then another 2 puffs about 12 hours later.  Add singulair 10 mg one each bedtime Use mucinex dm and flutter valve as per calendar   04/22/2013 f/u ov/Wert re: chronic cough with marked elevation of IgE c/w cough variant asthma Chief Complaint  Patient presents with  . Follow-up    Breathing is slightly better. Productive cough pale yellow phlem. No wheezing. No chest tx   Organization of meds in bags but not correlating with calendar - not sure he's following any of the instructions given.   Typically does respond somewhat to short courses of prednisone. >>pred 20mg    05/13/2013 Follow up and med review  Patient returns for a followup and medication review. We reviewed all his medications and organized them into a medication calendar with patient education Appears he is taking her meds correctly.  Last ov was recommended to increase prednisone 20mg  daily. Did not see much improvement in cough.  Cough is productive with clear to light yellow mucus on/off . Using mucinex DM with some help.  He remains acitve.  Rides bike often, -now rides. recumbent trike.  Avid cyclist-has ridden in all 50 states.  No hemoptysis, chest pain, orthopnea, edema or n/v.    Current Medications, Allergies, Complete  Past Medical History, Past Surgical History, Family History, and Social History were reviewed in [employer] record.  ROS  The following are not active complaints unless bolded sore throat, dysphagia, dental problems, itching, sneezing,  nasal congestion or excess/ purulent secretions, ear ache,   fever, chills, sweats, unintended wt loss, pleuritic or exertional cp, hemoptysis,  orthopnea pnd or leg swelling, presyncope, palpitations, heartburn, abdominal pain, anorexia, nausea, vomiting, diarrhea  or change in bowel or urinary habits, change in stools or urine, dysuria,hematuria,  rash, arthralgias, visual complaints, headache, numbness weakness or ataxia or problems with walking or coordination,  change in mood/affect or memory.            Objective:   Physical Exam  Constitutional: He is oriented to person, place, and time. He appears well-developed and well-nourished. No distress.  HENT:  Head: Normocephalic and atraumatic.  Right Ear: External ear normal.  Left Ear: External ear normal.  Mouth/Throat: Oropharynx is clear and moist. No oropharyngeal exudate.  Eyes: Conjunctivae and EOM are normal. Pupils are equal, round, and reactive to light. Right eye exhibits no discharge. Left eye exhibits no discharge. No scleral icterus.  Neck: Normal range of motion. Neck supple. No JVD present. No tracheal deviation present. No thyromegaly present.  Cardiovascular: Normal rate, regular rhythm and intact distal pulses.  Exam reveals no gallop and no friction rub.   No murmur heard. Pulmonary/Chest : few scattered exp rhonchi bilaterally Abdominal: Soft. Bowel sounds are normal. He exhibits no distension and no mass. There is no tenderness. There is no rebound and no guarding.  Musculoskeletal: Normal range of motion. He exhibits no edema and no tenderness.  Lymphadenopathy:    He has no cervical adenopathy.  Neurological: He is alert and oriented to person, place, and  time. He has normal reflexes. No cranial nerve deficit. Coordination normal.  Skin: Skin is warm and dry. No rash noted. He is not diaphoretic. No erythema. No pallor.  Psychiatric: He has a normal mood and affect. His behavior is normal. Judgment and thought content normal.  CXR  03/04/2013 : Streaky left basilar atelectasis, scarring or infiltrate.    CT sinus 03/05/2013 Mild deviation of the nasal septum to the right. No evidence of sinusitis.      Assessment & Plan:

## 2013-05-13 NOTE — Addendum Note (Signed)
Addended by: Parke Poisson E on: 05/13/2013 12:53 PM   Modules accepted: Orders

## 2013-05-18 ENCOUNTER — Other Ambulatory Visit: Payer: Self-pay | Admitting: *Deleted

## 2013-05-18 MED ORDER — INSULIN LISPRO 100 UNIT/ML ~~LOC~~ SOLN: SUBCUTANEOUS | Status: DC

## 2013-05-18 MED ORDER — ESTAZOLAM 2 MG PO TABS: 2.0000 mg | ORAL_TABLET | Freq: Every day | ORAL | Status: DC

## 2013-05-22 NOTE — Addendum Note (Signed)
Addended by: Parke Poisson E on: 05/22/2013 12:45 PM   Modules accepted: Orders

## 2013-05-30 ENCOUNTER — Other Ambulatory Visit: Payer: Self-pay | Admitting: Internal Medicine

## 2013-06-08 ENCOUNTER — Telehealth: Payer: Self-pay | Admitting: Internal Medicine

## 2013-06-08 MED ORDER — MOMETASONE FURO-FORMOTEROL FUM 200-5 MCG/ACT IN AERO: 2.0000 | INHALATION_SPRAY | Freq: Two times a day (BID) | RESPIRATORY_TRACT | Status: DC

## 2013-06-08 NOTE — Telephone Encounter (Signed)
I called and spoke with pt. He is currently in [place] and needs his dulera sent to the [facility] in [place]. I advised pt will do so. Nothing further needed

## 2013-06-17 ENCOUNTER — Ambulatory Visit: Payer: Medicare PPO | Admitting: Internal Medicine

## 2013-06-24 ENCOUNTER — Telehealth: Payer: Self-pay | Admitting: Internal Medicine

## 2013-06-24 ENCOUNTER — Encounter: Payer: Self-pay | Admitting: Internal Medicine

## 2013-06-24 ENCOUNTER — Ambulatory Visit (INDEPENDENT_AMBULATORY_CARE_PROVIDER_SITE_OTHER): Payer: Medicare PPO | Admitting: Internal Medicine

## 2013-06-24 ENCOUNTER — Encounter (INDEPENDENT_AMBULATORY_CARE_PROVIDER_SITE_OTHER): Payer: Self-pay

## 2013-06-24 VITALS — BP 142/74 | HR 107 | Temp 98.0°F | Ht 70.75 in | Wt 239.0 lb

## 2013-06-24 DIAGNOSIS — J45991 Cough variant asthma: Secondary | ICD-10-CM

## 2013-06-24 MED ORDER — DM-GUAIFENESIN ER 30-600 MG PO TB12: ORAL_TABLET | ORAL | Status: DC

## 2013-06-24 MED ORDER — PREDNISONE 10 MG PO TABS: 10.0000 mg | ORAL_TABLET | Freq: Every day | ORAL | Status: DC

## 2013-06-24 NOTE — Telephone Encounter (Signed)
I called and spoke with pt. Made him aware RX for prednisone was already sent to the pharmacy. Nothing further needed

## 2013-06-24 NOTE — Patient Instructions (Signed)
Reduce prednisone to 10 mg one half daily  Increase chlortrimeton to 4 mg 2 at bedtime as per calendar  For cough > mucinex dm up to 1200 mg every 12 hours and use flutter as much as possible   Please schedule a follow up office visit in 4 weeks, sooner if needed

## 2013-06-24 NOTE — Progress Notes (Signed)
Subjective:    Patient ID: Clayton [name], male    DOB: 05/31/1928    MRN: [id number]  HPI PCP Lujean Amel, MD HPI #Followup  - Influenza related acute ventilatory respiratory failure on the ventilator and post influenza MRSA pneumonia - January 2014  - False positive Non-small cell lung cancer detected on an endobronchial mucous plug during acute ICU stay January 2014  - deemed false positive based on - PET scan March 2014 and normal bronch March 2014 and 2nd opinion Jan 2014 path by DR Dian Situ  - Chronic cough that predates above  - PFT 09/02/12: His pulmonary function test today is essentially normal except for reduced forced vital capacity, 9% bronchodilator response with FEV1 and in reduced small airway function. Specifically, FEV1 is 2.1 L/80%. FVC 3.1 L/73%. Bronchodilator response FEV1 is 9%. Small airways is 60%. Total lung capacity is 83%. DLCO is 85%.  - March 2014: Normal bronchoscopy   OV 10/13/2012  Follow for chronic cough.  -At last visit on 08/29/2012 started him on a 2 part, nasal steroid, over-the-counter proton pump inhibitor and asked him to continue on DuoNeb and Qvar. In addition started gabapentin and referred him to speech therapy. With these interventions cough is on 50% better and RSI cough score is reduced from 16 to 9. However, he and his [name] are not happy with the level of improvement. Diffusing quality of life is impaired because he's unable to attend occasional social functions and they feel cough is a social embarrassment. They admit to significant gagging and tearing of the throat. Of note, he did and speech therapy for classes but since has been discharged from followup  - In terms of the supposed to lung cancer got an e-mail from Dr. Dian Situ who feels that his pathology but did false positive atypia and that she is seen cases like this and patient to be acutely ill.  REC #Sleep apnea  -Room air cpap to continue  #Lung cancer - No  evidence of lung cancer as of April 2014 - Pathologist in [place] says likely reaction to acute infection - Followup CT chest 1 year   #Chronic cough Cough is from sinus drainage, possible acid reflux, possible asthma,  All of this is working together to cause cyclical cough/LPR cough otherwise for irritable larynx  #Sinus drainage  - continue netti pot daily  or 3% hypertonic saline nasal spray -  Continue nasal steroid generic fluticasone inhaler 2 squirts each nostril daily as advised  - start asteprol nasal inhaler day time 2 squirts - ENT consult depending on course      #Acute bronchitis  - Take doxycycline 100mg  po twice daily x 5 days; take after meals and avoid sunlight  -Take prednisone 40 mg daily x 2 days, then 20mg  daily x 2 days, then 10mg  daily x 2 days, then 5mg  daily x 2 days and stop  - give sputum for gram stain and culture; will call with results  #Chronic cough  - so far no better and gabapentin not helping  - slow stop gabapentin over 3 weeks  - continue nasal inhalers and oral inhalers for lung  - see ENT physician for chronic cough/.sinus and hearing issues as well  #Followup  2 months with cough score   OV 02/11/2013 qvar not helping so stopped.  Nebs help but Cough overall a bother. RSI score 19 and shows lack of improvement. He is frustrated by the lack of improvement in his cough. Neurontin not helping.  Nasal inhalers helping only partially He shows me each visit take brown sputum. All features upon chronic bronchitis but he really wants relief from his cough. He is open to a second opinion rec  #Chronic cough  - so far no better despite all measures I have tried  - stop QVAR due to lack of relief -continue nebs and other nasal treatment  #Lung nodule  - next scan needed Jan 2015  #Followup Dr Melvyn Novas for 2nd opinion on cough Return to see me JAn 2015 with CT chest   03/04/2013 Nerissa Constantin/ Consultation/ second opinion:   re persistent cough ever since  the flu requiring ET Chief Complaint  Patient presents with  . Pulmonary Consult    Referred per MR for second opinion on cough. Pt c/o cough since Jan 2014. Cough is prod with moderate pale yellow sputum.  He states that there is no specific thing that triggers the cough. He c/o increased SOB and leg cramps for the past wk. Using neb txs approx 3 times per day.   cough daily ever since tube pulled with waxes and wanes some s pattern x better p neb treatment always productive up to 2 tbsp per day thick yellow mucus >>Levaquin 750 mg one daily x 5 days Prednisone 10 mg take  4 each am x 2 days,   2 each am x 2 days,  1 each am x 2 days and stop  For cough mucinex dm 1200 mg every 12 hours use your flutter ,dulera 100 Take 2 puffs first thing in am and then another 2 puffs about 12 hours later -work on technique Pantoprazole (protonix) 40 mg   Take 30-60 min before first meal of the day and Pepcid 20 mg one bedtime until return to office -    03/18/2013 Follow up and med review  Patient returns for a followup and medication review. We reviewed all his medications and organized them into a medication calendar with patient education Seen 2 weeks ago with Dr. Melvyn Novas for second opinion related to persistent cough. He was given Levaquin 750 mg daily for 5 days a prednisone taper, and started on Dulera twice daily. He was also treated with Protonix, and Pepcid Returns today feeling that his breathing is doing well today; still taking 20mg  prednisone daily. Workup with CXR and CT sinus unrevealing.  He did not get mucinex DM as recommended and is using duoneb as scheduled med.  Overall  Cough is much better with significant drop in coughing but not totally cough free.  No hemoptyisis, chest pain , orthopnea, edema.  Did not taper prednisone as directed.  Last ov labs showed IGE elevated 707 , +rast , bnp nml. Cbc w/ no eosinophils elevation.  rec Decrease prednisone 10mg  daily for 1 week then 1/2 daily  for 1 week and stop > found did better on pred 20 (75%) Add Mucinex DM Twice daily  As needed  Cough.  Add flutter valve As needed  For cough Brush/rinse and gargle after inhaler use.  Follow med calendar closely and bring to each visit.  follow up Dr. Melvyn Novas  In 3 weeks and As needed     04/08/2013 f/u ov/Trystin Hargrove re: chronic cough > sob  since Jan 2014 extubation  Chief Complaint  Patient presents with  . Follow-up    Pt states that his breathing seems to be doing some better. His cough is more prod with pale yellow sputum- esp on days when only takes 10 m pred daily.  Not clear he's following med calendar "I have my own system" rec Increase dulera to 200 Take 2 puffs first thing in am and then another 2 puffs about 12 hours later.  Add singulair 10 mg one each bedtime Use mucinex dm and flutter valve as per calendar   04/22/2013 f/u ov/Jonnatan Hanners re: chronic cough with marked elevation of IgE c/w cough variant asthma Chief Complaint  Patient presents with  . Follow-up    Breathing is slightly better. Productive cough pale yellow phlem. No wheezing. No chest tx   Organization of meds in bags but not correlating with calendar - not sure he's following any of the instructions given.   Typically does respond somewhat to short courses of prednisone. >>pred 20mg    05/13/2013 Follow up and med review  Patient returns for a followup and medication review. We reviewed all his medications and organized them into a medication calendar with patient education Appears he is taking her meds correctly.  Last ov was recommended to increase prednisone 20mg  daily. Did not see much improvement in cough.  Cough is productive with clear to light yellow mucus on/off . Using mucinex DM with some help.  He remains acitve.  Rides bike often, -now rides. recumbent trike.  Avid cyclist-has ridden in all 50 states.  rec Add Chlorpheniramine 4mg  2 tabs At bedtime   Change to Mucinex 1-2 Twice daily  As needed   Cough/congestion  Add Delsym 2 tsp Twice daily  As needed  Cough  Decrease Prednisone 10mg  daily  Follow med calendar closely and bring to each visit.  Goal is to not cough, use sugarless candy and water to help soothe throat  Avoid coughing and throat clearing  NO MINTS.   06/24/2013 f/u ov/Camarion Weier re: cough/ prednisone 10 mg daily  Chief Complaint  Patient presents with  . Follow-up    Pt states cough is some improved since his last visit. No new co's today.   able to bike up to 11 miles per day vs 17 prior to ET but not biking as much due to weather.   No resp medications at all prior to his flu in Jan 2014  Sleeps well and no flare in am of cough until stirs around then gets up a little slt yellow mucus.  No obvious day to day or daytime variabilty or assoc cp or chest tightness, subjective wheeze overt sinus or hb symptoms. No unusual exp hx or h/o childhood pna/ asthma or knowledge of premature birth.  Sleeping ok without nocturnal  or early am exacerbation  of respiratory  c/o's or need for noct saba. Also denies any obvious fluctuation of symptoms with weather or environmental changes or other aggravating or alleviating factors except as outlined above   Current Medications, Allergies, Complete Past Medical History, Past Surgical History, Family History, and Social History were reviewed in [employer] record.  ROS  The following are not active complaints unless bolded sore throat, dysphagia, dental problems, itching, sneezing,  nasal congestion or excess/ purulent secretions, ear ache,   fever, chills, sweats, unintended wt loss, pleuritic or exertional cp, hemoptysis,  orthopnea pnd or leg swelling, presyncope, palpitations, heartburn, abdominal pain, anorexia, nausea, vomiting, diarrhea  or change in bowel or urinary habits, change in stools or urine, dysuria,hematuria,  rash, arthralgias, visual complaints, headache, numbness weakness or ataxia or problems with  walking or coordination,  change in mood/affect or memory.  Objective:   Physical Exam  amb obese wm nad with   barking cough but not as harsh  Wt Readings from Last 3 Encounters:  06/24/13 239 lb (108.41 kg)  05/13/13 232 lb 9.6 oz (105.507 kg)  04/22/13 236 lb 6.4 oz (107.23 kg)       Head: Normocephalic and atraumatic.  Right Ear: External ear normal.  Left Ear: External ear normal.  Mouth/Throat: Oropharynx is clear and moist. No oropharyngeal exudate.  Eyes: Conjunctivae and EOM are normal. Pupils are equal, round, and reactive to light. Right eye exhibits no discharge. Left eye exhibits no discharge. No scleral icterus.  Neck: Normal range of motion. Neck supple. No JVD present. No tracheal deviation present. No thyromegaly present.  Cardiovascular: Normal rate, regular rhythm and intact distal pulses.  Exam reveals no gallop and no friction rub.   No murmur heard. Pulmonary/Chest : few scattered exp rhonchi bilaterally Abdominal: Soft. Bowel sounds are normal. He exhibits no distension and no mass. There is no tenderness. There is no rebound and no guarding.  Musculoskeletal: Normal range of motion. He exhibits no edema and no tenderness.  Lymphadenopathy:    He has no cervical adenopathy.  Neurological: He is alert and oriented to person, place, and time. He has normal reflexes. No cranial nerve deficit. Coordination normal.  Skin: Skin is warm and dry. No rash noted. He is not diaphoretic. No erythema. No pallor.  Psychiatric: He has a normal mood and affect. His behavior is normal. Judgment and thought content normal.     CXR  03/04/2013 : Streaky left basilar atelectasis, scarring or infiltrate.    CT sinus 03/05/2013 Mild deviation of the nasal septum to the right. No evidence of sinusitis.      Assessment & Plan:

## 2013-06-24 NOTE — Assessment & Plan Note (Addendum)
-   pfts 08/18/12  FEV1    2.11 ( 80%) ratio 69  -  Spirometry 06/24/13  2.10 (65%) ratio 68 - Sinus CT 03/05/2013 > Mild deviation of the nasal septum to the right. No evidence of  Sinusitis. - Allergy profile 03/05/13 > IgE 707 mulitple resp allergens identified including dogs/cats/ragweed/ trees  -- hfa 90% 04/08/2013  - Singulair 04/08/13  - daily pred rx 04/22/13  -med calendar 05/13/2013   DDX of  difficult airways managment all start with A and  include Adherence, Ace Inhibitors, Acid Reflux, Active Sinus Disease, Alpha 1 Antitripsin deficiency, Anxiety masquerading as Airways dz,  ABPA,  allergy(esp in young), Aspiration (esp in elderly), Adverse effects of DPI,  Active smokers, plus two Bs  = Bronchiectasis and Beta blocker use..and one C= CHF  Adherence is always the initial "prime suspect" and is a multilayered concern that requires a "trust but verify" approach in every patient - starting with knowing how to use medications, especially inhalers, correctly, keeping up with refills and understanding the fundamental difference between maintenance and prns vs those medications only taken for a very short course and then stopped and not refilled. The proper method of use, as well as anticipated side effects, of a metered-dose inhaler are discussed and demonstrated to the patient. Improved effectiveness after extensive coaching during this visit to a level of approximately  90%    Each maintenance medication was reviewed in detail including most importantly the difference between maintenance and as needed and under what circumstances the prns are to be used. This was done in the context of a medication calendar review which provided the patient with a user-friendly unambiguous mechanism for medication administration and reconciliation and provides an action plan for all active problems. It is critical that this be shown to every doctor  for modification during the office visit if necessary so the patient  can use it as a working document.      Will try lower pred dose to 5 mg daily

## 2013-06-29 ENCOUNTER — Telehealth: Payer: Self-pay | Admitting: Internal Medicine

## 2013-06-29 ENCOUNTER — Other Ambulatory Visit (INDEPENDENT_AMBULATORY_CARE_PROVIDER_SITE_OTHER): Payer: Medicare PPO

## 2013-06-29 DIAGNOSIS — E1165 Type 2 diabetes mellitus with hyperglycemia: Principal | ICD-10-CM

## 2013-06-29 DIAGNOSIS — IMO0001 Reserved for inherently not codable concepts without codable children: Secondary | ICD-10-CM

## 2013-06-29 LAB — BASIC METABOLIC PANEL
BUN: 38 mg/dL — ABNORMAL HIGH (ref 6–23)
CHLORIDE: 108 meq/L (ref 96–112)
CO2: 24 meq/L (ref 19–32)
Calcium: 8.9 mg/dL (ref 8.4–10.5)
Creatinine, Ser: 2.6 mg/dL — ABNORMAL HIGH (ref 0.4–1.5)
GFR: 24.82 mL/min — AB (ref >60.00)
GLUCOSE: 166 mg/dL — AB (ref 70–99)
Potassium: 4.8 mEq/L (ref 3.5–5.1)
SODIUM: 141 meq/L (ref 135–145)

## 2013-06-29 NOTE — Telephone Encounter (Signed)
Called and spoke with pt and he stated that he was seen on 06/24/13 by MW.  Prednisone was decreased from 10 mg daily to 1/2 daily.  He stated that he has noticed his chest is rattling more.  He has been using  Vinegar, honey and water to help to loosen the congestion in his chest.  Pt stated that when he gets this congestion up its a pale yellow.  He denies any other symptoms and no SOB.  MW please advise. Thanks  Allergies  Allergen Reactions  . Penicillins      Current Outpatient Prescriptions on File Prior to Visit  Medication Sig Dispense Refill  . chlorpheniramine (CHLOR-TRIMETON) 4 MG tablet 2 tablets by mouth at bedtime      . Cholecalciferol (VITAMIN D3) 2000 UNITS TABS Take 1 capsule by mouth every morning.      Marland Kitchen dextromethorphan-guaiFENesin (MUCINEX DM) 30-600 MG per 12 hr tablet 1-2 every 12 hours as needed for cough      . diphenhydramine-acetaminophen (TYLENOL PM) 25-500 MG TABS Take 2 tablets by mouth at bedtime as needed (insomnia).      . docusate sodium (COLACE) 100 MG capsule 1 capsules by mouth twice daily      . estazolam (PROSOM) 2 MG tablet Take 1 tablet (2 mg total) by mouth at bedtime.  30 tablet  5  . famotidine (PEPCID) 20 MG tablet One at bedtime  30 tablet  11  . fesoterodine (TOVIAZ) 8 MG TB24 Take 8 mg by mouth at bedtime.       . finasteride (PROSCAR) 5 MG tablet Take 1 tablet (5 mg total) by mouth daily.  30 tablet  5  . furosemide (LASIX) 20 MG tablet Take 20 mg by mouth daily as needed.       Marland Kitchen glucose blood (FREESTYLE LITE) test strip Use to check blood sugars 4 times per day  150 each  5  . insulin glargine (LANTUS) 100 UNIT/ML injection Take 20 units before breakfast and supper      . insulin lispro (HUMALOG) 100 UNIT/ML injection Inject 25 units three times per day  30 mL  5  . ipratropium-albuterol (DUONEB) 0.5-2.5 (3) MG/3ML SOLN Take 3 mLs by nebulization every 4 (four) hours as needed.      . Lancets (FREESTYLE) lancets Use to check blood sugars 4  times per day  150 each  5  . mometasone-formoterol (DULERA) 200-5 MCG/ACT AERO Inhale 2 puffs into the lungs 2 (two) times daily.  1 Inhaler  3  . montelukast (SINGULAIR) 10 MG tablet One at bedtime every night  30 tablet  2  . Multiple Vitamin (MULTIVITAMIN WITH MINERALS) TABS tablet Take 1 tablet by mouth daily.      . pantoprazole (PROTONIX) 40 MG tablet TAKE 1 TABLET (40 MG TOTAL) BY MOUTH DAILY. TAKE 30-60 MIN BEFORE FIRST MEAL OF THE DAY  30 tablet  11  . Polysacch Fe Cmp-Fe Heme Poly (BIFERA) 28 MG TABS Take 1 tablet by mouth daily.      . predniSONE (DELTASONE) 10 MG tablet Take 1 tablet (10 mg total) by mouth daily with breakfast.  30 tablet  2  . Respiratory Therapy Supplies (FLUTTER) DEVI Use as directed.  1 each  0  . simvastatin (ZOCOR) 20 MG tablet Take 1 tablet (20 mg total) by mouth every evening.  30 tablet  5   No current facility-administered medications on file prior to visit.

## 2013-06-29 NOTE — Telephone Encounter (Signed)
I called and spoke w/ pt. Aware of MW recs. Nothing further needed

## 2013-06-29 NOTE — Telephone Encounter (Signed)
Increase back then to 10 mg daily for now

## 2013-06-30 LAB — FRUCTOSAMINE: Fructosamine: 260 umol/L (ref <285)

## 2013-07-01 ENCOUNTER — Ambulatory Visit: Payer: Medicare PPO | Admitting: Endocrinology

## 2013-07-03 ENCOUNTER — Encounter: Payer: Self-pay | Admitting: Endocrinology

## 2013-07-03 ENCOUNTER — Ambulatory Visit (INDEPENDENT_AMBULATORY_CARE_PROVIDER_SITE_OTHER): Payer: Medicare PPO | Admitting: Endocrinology

## 2013-07-03 VITALS — BP 122/56 | HR 102 | Temp 98.2°F | Resp 16 | Ht 70.5 in | Wt 234.2 lb

## 2013-07-03 DIAGNOSIS — N400 Enlarged prostate without lower urinary tract symptoms: Secondary | ICD-10-CM | POA: Insufficient documentation

## 2013-07-03 DIAGNOSIS — E1165 Type 2 diabetes mellitus with hyperglycemia: Secondary | ICD-10-CM

## 2013-07-03 DIAGNOSIS — E1129 Type 2 diabetes mellitus with other diabetic kidney complication: Secondary | ICD-10-CM

## 2013-07-03 DIAGNOSIS — IMO0001 Reserved for inherently not codable concepts without codable children: Secondary | ICD-10-CM | POA: Insufficient documentation

## 2013-07-03 DIAGNOSIS — N289 Disorder of kidney and ureter, unspecified: Secondary | ICD-10-CM

## 2013-07-03 NOTE — Patient Instructions (Signed)
LANTUS insulin: Increase evening dose to 24 units and keep morning sugar under 130; continue morning dose of 20 units Humalog insulin: Increased morning dose to 20 units, lunchtime sugar should be under 130 with this Check a few readings occasionally after supper

## 2013-07-03 NOTE — Progress Notes (Signed)
Patient ID: Clayton [name], male   DOB: 1927-09-15, 80 y.o.   MRN: [id number]  Reason for Appointment: Followup  History of Present Illness   Diagnosis: Type 2 DIABETES MELITUS      He has had long-standing diabetes and has been on insulin for the last few years Since 2014 he has required larger doses of insulin especially with having to take periodic courses of steroids Also his Actos was stopped because of tendency to edema and metformin stopped because of renal dysfunction He has been adjusting his insulin periodically  for high readings Although A1c is not available his fructosamine indicates overall fairly good control recently This week he has been on 10 mg of prednisone instead of 5 mg and he thinks his blood sugars have been higher Did not bring his monitor for download Has increased his HUMALOG only  by 2 units in the morning and has not changed Lantus even with high fasting reading recently   Oral hypoglycemic drugs: None      Side effects from medications: None Insulin regimen: Humalog 15-17  units before meals, Lantus recently 20 units twice a day        Monitors blood glucose:  3 times a day   Glucometer: One Touch.          Glucometer by recall reviewed: readings before breakfast: 170 recently, 200 at lunch and fairly good at suppertime    Hypoglycemia frequency:  none recently,   Meals: 3 meals per day.  breakfast: oatmeal, grits, some cheese        Physical activity: exercise: minimal recently             Complications are: Nephropathy  RENAL insufficiency: He has had mild increase in creatinine, was 1.7 last. This is related to nephropathy and glomerulosclerosis However his level is increased to 2.6. He has no change in his urinary symptoms, has had BPH. No history of nephrolithiasis or UTI No recent use of nonsteroidal OTC drugs or diuretics   LABS:  Lab Results  Component Value Date   HGBA1C 7.9* 04/01/2013   HGBA1C 7.5* 12/29/2012   HGBA1C 7.4* 06/20/2012    Lab Results  Component Value Date   MICROALBUR 19.6* 04/01/2013   [place] [address] 04/01/2013   CREATININE 2.6* 06/29/2013    Appointment on 06/29/2013  Component Date Value Range Status  . Fructosamine 06/29/2013 260  <285 umol/L Final   Comment:                            Variations in levels of serum proteins (albumin and immunoglobulins)                          may affect fructosamine results.                             . Sodium 06/29/2013 141  135 - 145 mEq/L Final  . Potassium 06/29/2013 4.8  3.5 - 5.1 mEq/L Final  . Chloride 06/29/2013 108  96 - 112 mEq/L Final  . CO2 06/29/2013 24  19 - 32 mEq/L Final  . Glucose, Bld 06/29/2013 166* 70 - 99 mg/dL Final  . BUN 06/29/2013 38* 6 - 23 mg/dL Final  . Creatinine, Ser 06/29/2013 2.6* 0.4 - 1.5 mg/dL Final  . Calcium 06/29/2013 8.9  8.4 - 10.5 mg/dL Final  . GFR 06/29/2013 24.82* >  60.00 mL/min Final      Medication List       This list is accurate as of: 07/03/13 10:41 AM.  Always use your most recent med list.               BIFERA 28 MG Tabs  Take 1 tablet by mouth daily.     chlorpheniramine 4 MG tablet  Commonly known as:  CHLOR-TRIMETON  2 tablets by mouth at bedtime     dextromethorphan-guaiFENesin 30-600 MG per 12 hr tablet  Commonly known as:  MUCINEX DM  1-2 every 12 hours as needed for cough     diphenhydramine-acetaminophen 25-500 MG Tabs  Commonly known as:  TYLENOL PM  Take 2 tablets by mouth at bedtime as needed (insomnia).     docusate sodium 100 MG capsule  Commonly known as:  COLACE  1 capsules by mouth twice daily     estazolam 2 MG tablet  Commonly known as:  PROSOM  Take 1 tablet (2 mg total) by mouth at bedtime.     famotidine 20 MG tablet  Commonly known as:  PEPCID  One at bedtime     finasteride 5 MG tablet  Commonly known as:  PROSCAR  Take 1 tablet (5 mg total) by mouth daily.     FLUTTER Devi  Use as directed.     freestyle lancets  Use to check blood sugars 4 times per day      furosemide 20 MG tablet  Commonly known as:  LASIX  Take 20 mg by mouth daily as needed.     glucose blood test strip  Commonly known as:  FREESTYLE LITE  Use to check blood sugars 4 times per day     insulin glargine 100 UNIT/ML injection  Commonly known as:  LANTUS  Take 20 units before breakfast and supper     insulin lispro 100 UNIT/ML injection  Commonly known as:  HUMALOG  Inject 25 units three times per day     ipratropium-albuterol 0.5-2.5 (3) MG/3ML Soln  Commonly known as:  DUONEB  Take 3 mLs by nebulization every 4 (four) hours as needed.     mometasone-formoterol 200-5 MCG/ACT Aero  Commonly known as:  DULERA  Inhale 2 puffs into the lungs 2 (two) times daily.     montelukast 10 MG tablet  Commonly known as:  SINGULAIR  One at bedtime every night     multivitamin with minerals Tabs tablet  Take 1 tablet by mouth daily.     pantoprazole 40 MG tablet  Commonly known as:  PROTONIX  TAKE 1 TABLET (40 MG TOTAL) BY MOUTH DAILY. TAKE 30-60 MIN BEFORE FIRST MEAL OF THE DAY     predniSONE 10 MG tablet  Commonly known as:  DELTASONE  Take 1 tablet (10 mg total) by mouth daily with breakfast.     simvastatin 20 MG tablet  Commonly known as:  ZOCOR  Take 1 tablet (20 mg total) by mouth every evening.     TOVIAZ 8 MG Tb24 tablet  Generic drug:  fesoterodine  Take 8 mg by mouth at bedtime.     Vitamin D3 2000 UNITS Tabs  Take 1 capsule by mouth every morning.        Allergies:  Allergies  Allergen Reactions  . Penicillins     Past Medical History  Diagnosis Date  . Diabetes mellitus   . Hypertension   . Anemia   . Chronic renal insufficiency   .  Osteoporosis   . Neuromuscular disorder   . DDD (degenerative disc disease), lumbar     Past Surgical History  Procedure Laterality Date  . Vasectomy    . Eye surgery    . Video bronchoscopy Bilateral 08/25/2012    Procedure: VIDEO BRONCHOSCOPY WITHOUT FLUORO;  Surgeon: Brand Males, MD;   Location: [facility] [facility];  Service: Cardiopulmonary;  Laterality: Bilateral;    Family History  Problem Relation Age of Onset  . Diabetes Mellitus II    . Hypertension      Social History:  reports that he quit smoking about 40 years ago. His smoking use included Cigarettes. He has a 99 pack-year smoking history. He quit smokeless tobacco use about 37 years ago. He reports that he does not drink alcohol or use illicit drugs.  Review of Systems -   He still has some cough but getting worse recently and prednisone increased this week. No expectoration but is complaining about her right leg and his chest. He is being followed by pulmonologist.   Previous hypertension: Currently not requiring any medications  Edema: Not present at this time, also not taking any Lasix  He has had lower urinary tract problems and BPH, currently taking Proscar and Toviaz from urologist Last visit with him was in 10/14 and his residual urine volume was only 75 mL  HYPERLIPIDEMIA:         The lipid abnormality consists of  minimally elevated LDL, taking simvastatin for cardiovascular protection    Examination:   BP 122/56  Pulse 102  Temp(Src) 98.2 F (36.8 C)  Resp 16  Ht 5' 10.5" (1.791 m)  Wt 234 lb 3.2 oz (106.232 kg)  BMI 33.12 kg/m2  SpO2 95%  Body mass index is 33.12 kg/(m^2).   Abdomen shows no hepatosplenomegaly, kidneys and not ballotable Bladder not palpable, only lower hypogastric dullness by percussion  Diabetic foot exam done. No edema in his legs or feet  Assesment/PLAN:   1. Diabetes type 2, uncontrolled  The patient's diabetes control appears to be overall fairly good as judged by the fructosamine However blood sugars are going up again with increasing his prednisone to 10 mg in the last few days He has not increased his insulin enough to keep up with the hyperglycemia Previously had required as much as 25 minutes with mealtime insulin and will increase at least his morning dose  to 20 Humalog Also will need at least 24 Lantus in the evening, discussed how to titrate this based on morning readings Currently does not have any problems with his dietary compliance Hopefully he can start exercising also which will help.  2. Diabetic nephropathy: Edema is well-controlled without any Lasix Recently has had only minimal microalbuminuria   3. Renal insufficiency: His creatinine is significantly higher with no obvious predisposing factors. Will schedule renal ultrasound and nephrology consultation.  4. Persistent cough requiring prednisone: continue followup with pulmonologist  Total visit time including counseling = 25 minutes  Clayton [name]  07/03/2013, 10:41 AM

## 2013-07-14 ENCOUNTER — Ambulatory Visit (INDEPENDENT_AMBULATORY_CARE_PROVIDER_SITE_OTHER)
Admission: RE | Admit: 2013-07-14 | Discharge: 2013-07-14 | Disposition: A | Discharge status: Home / Self Care | Payer: Medicare PPO | Source: Ambulatory Visit | Attending: Internal Medicine | Admitting: Internal Medicine

## 2013-07-14 DIAGNOSIS — R911 Solitary pulmonary nodule: Secondary | ICD-10-CM

## 2013-07-15 ENCOUNTER — Ambulatory Visit: Payer: Medicare PPO | Admitting: Endocrinology

## 2013-07-15 ENCOUNTER — Other Ambulatory Visit (INDEPENDENT_AMBULATORY_CARE_PROVIDER_SITE_OTHER): Payer: Medicare PPO

## 2013-07-15 DIAGNOSIS — N289 Disorder of kidney and ureter, unspecified: Secondary | ICD-10-CM

## 2013-07-15 DIAGNOSIS — E1165 Type 2 diabetes mellitus with hyperglycemia: Principal | ICD-10-CM

## 2013-07-15 DIAGNOSIS — E1129 Type 2 diabetes mellitus with other diabetic kidney complication: Secondary | ICD-10-CM

## 2013-07-15 LAB — URINALYSIS, ROUTINE W REFLEX MICROSCOPIC
Bilirubin Urine: NEGATIVE
Hgb urine dipstick: NEGATIVE
Ketones, ur: NEGATIVE
Leukocytes, UA: NEGATIVE
NITRITE: NEGATIVE
PH: 5.5 (ref 5.0–8.0)
SPECIFIC GRAVITY, URINE: 1.015 (ref 1.000–1.030)
Total Protein, Urine: 30 — AB
URINE GLUCOSE: NEGATIVE
Urobilinogen, UA: 0.2 (ref 0.0–1.0)

## 2013-07-15 LAB — COMPREHENSIVE METABOLIC PANEL
ALK PHOS: 86 U/L (ref 39–117)
ALT: 25 U/L (ref 0–53)
AST: 24 U/L (ref 0–37)
Albumin: 4 g/dL (ref 3.5–5.2)
BILIRUBIN TOTAL: 0.5 mg/dL (ref 0.3–1.2)
BUN: 43 mg/dL — AB (ref 6–23)
CO2: 24 mEq/L (ref 19–32)
Calcium: 9.3 mg/dL (ref 8.4–10.5)
Chloride: 107 mEq/L (ref 96–112)
Creatinine, Ser: 2.3 mg/dL — ABNORMAL HIGH (ref 0.4–1.5)
GFR: 29.27 mL/min — ABNORMAL LOW (ref >60.00)
Glucose, Bld: 83 mg/dL (ref 70–99)
Potassium: 4.3 mEq/L (ref 3.5–5.1)
Sodium: 139 mEq/L (ref 135–145)
Total Protein: 7.5 g/dL (ref 6.0–8.3)

## 2013-07-15 LAB — CBC
HCT: 36.9 % — ABNORMAL LOW (ref 39.0–52.0)
HEMOGLOBIN: 12.1 g/dL — AB (ref 13.0–17.0)
MCHC: 32.8 g/dL (ref 30.0–36.0)
MCV: 97.8 fl (ref 78.0–100.0)
PLATELETS: 311 10*3/uL (ref 150.0–400.0)
RBC: 3.77 Mil/uL — AB (ref 4.22–5.81)
RDW: 14.6 % (ref 11.5–14.6)
WBC: 13.8 10*3/uL — AB (ref 4.5–10.5)

## 2013-07-15 LAB — HEMOGLOBIN A1C: Hgb A1c MFr Bld: 7.4 % — ABNORMAL HIGH (ref 4.6–6.5)

## 2013-07-16 ENCOUNTER — Encounter: Payer: Self-pay | Admitting: Endocrinology

## 2013-07-16 ENCOUNTER — Ambulatory Visit (INDEPENDENT_AMBULATORY_CARE_PROVIDER_SITE_OTHER): Payer: Medicare PPO | Admitting: Endocrinology

## 2013-07-16 VITALS — BP 142/70 | HR 94 | Temp 98.3°F | Resp 16 | Ht 70.5 in | Wt 235.6 lb

## 2013-07-16 DIAGNOSIS — E1165 Type 2 diabetes mellitus with hyperglycemia: Principal | ICD-10-CM

## 2013-07-16 DIAGNOSIS — IMO0001 Reserved for inherently not codable concepts without codable children: Secondary | ICD-10-CM

## 2013-07-16 DIAGNOSIS — N189 Chronic kidney disease, unspecified: Secondary | ICD-10-CM

## 2013-07-16 NOTE — Patient Instructions (Signed)
Only 15 Humalog when exercising  When prednisone reduced to 5mg  may reduce  Lantus and Humalog 3-4 units  Some readings at bedtime

## 2013-07-16 NOTE — Progress Notes (Signed)
Patient ID: Clayton [name], male   DOB: 1928/06/06, 80 y.o.   MRN: [id number]   Reason for Appointment: Followup  History of Present Illness   RENAL insufficiency: He has had mild increase in creatinine, was 1.7 previously. This has been related to nephropathy and glomerulosclerosis However his level was increased to 2.6 in 06/29/13 for no apparent reason. Since his blood pressure was not unusually low and he was not on diuretics or any OTC drugs no change was made and he was referred to a nephrologist but the appointment has not been made yet.  Lab Results  Component Value Date   CREATININE 2.3* 07/15/2013     Type 2 DIABETES MELITUS   He has had long-standing diabetes and has been on insulin for the last few years Since 2014 he has required larger doses of insulin especially with having to take periodic courses of steroids Also his Actos was stopped because of tendency to edema and metformin stopped because of renal dysfunction He has still been on 10 mg of prednisone for his respiratory symptoms from pulmonologist His Lantus was increased by 4 units in the evening on the last visit and his blood sugars overall improved. Has only a few high readings and evenings Humalog has been continued unchanged   Oral hypoglycemic drugs: None      Side effects from medications: None Insulin regimen: Humalog 15-20  units before meals, Lantus recently 20 units --24       Monitors blood glucose:  3 times a day   Glucometer: One Touch.          Glucose  readings:   PREMEAL Breakfast Lunch Dinner Bedtime Overall  Glucose range:  74-168   85-193   77-175   139-182    Mean/median:  120      134   POST-MEAL PC Breakfast PC Lunch PC Dinner  Glucose range:  49-106    107-248  Mean/median:       Hypoglycemia:  On 1/27 possibly after exercise  Meals: 3 meals per day.  breakfast: oatmeal, grits, some cheese        Physical activity: exercise: biking  about once a week             Complications are:  Nephropathy  LABS:  Lab Results  Component Value Date   HGBA1C 7.4* 07/15/2013   HGBA1C 7.9* 04/01/2013   HGBA1C 7.5* 12/29/2012   Lab Results  Component Value Date   MICROALBUR 19.6* 04/01/2013   [place] [address] 04/01/2013   CREATININE 2.3* 07/15/2013    Appointment on 07/15/2013  Component Date Value Range Status  . Hemoglobin A1C 07/15/2013 7.4* 4.6 - 6.5 % Final   Glycemic Control Guidelines for People with Diabetes:Non Diabetic:  <6%Goal of Therapy: <7%Additional Action Suggested:  >8%   . Sodium 07/15/2013 139  135 - 145 mEq/L Final  . Potassium 07/15/2013 4.3  3.5 - 5.1 mEq/L Final  . Chloride 07/15/2013 107  96 - 112 mEq/L Final  . CO2 07/15/2013 24  19 - 32 mEq/L Final  . Glucose, Bld 07/15/2013 83  70 - 99 mg/dL Final  . BUN 07/15/2013 43* 6 - 23 mg/dL Final  . Creatinine, Ser 07/15/2013 2.3* 0.4 - 1.5 mg/dL Final  . Total Bilirubin 07/15/2013 0.5  0.3 - 1.2 mg/dL Final  . Alkaline Phosphatase 07/15/2013 86  39 - 117 U/L Final  . AST 07/15/2013 24  0 - 37 U/L Final  . ALT 07/15/2013 25  0 -  53 U/L Final  . Total Protein 07/15/2013 7.5  6.0 - 8.3 g/dL Final  . Albumin 07/15/2013 4.0  3.5 - 5.2 g/dL Final  . Calcium 07/15/2013 9.3  8.4 - 10.5 mg/dL Final  . GFR 07/15/2013 29.27* >60.00 mL/min Final  . Color, Urine 07/15/2013 YELLOW  Yellow;Lt. Yellow Final  . APPearance 07/15/2013 CLEAR  Clear Final  . Specific Gravity, Urine 07/15/2013 1.015  1.000-1.030 Final  . pH 07/15/2013 5.5  5.0 - 8.0 Final  . Total Protein, Urine 07/15/2013 30* Negative Final  . Urine Glucose 07/15/2013 NEGATIVE  Negative Final  . Ketones, ur 07/15/2013 NEGATIVE  Negative Final  . Bilirubin Urine 07/15/2013 NEGATIVE  Negative Final  . Hgb urine dipstick 07/15/2013 NEGATIVE  Negative Final  . Urobilinogen, UA 07/15/2013 0.2  0.0 - 1.0 Final  . Leukocytes, UA 07/15/2013 NEGATIVE  Negative Final  . Nitrite 07/15/2013 NEGATIVE  Negative Final  . WBC, UA 07/15/2013 0-2/hpf  0-2/hpf Final  .  Squamous Epithelial / LPF 07/15/2013 Rare(0-4/hpf)  Rare(0-4/hpf) Final  . WBC 07/15/2013 13.8* 4.5 - 10.5 K/uL Final  . RBC 07/15/2013 3.77* 4.22 - 5.81 Mil/uL Final  . Platelets 07/15/2013 311.0  150.0 - 400.0 K/uL Final  . Hemoglobin 07/15/2013 12.1* 13.0 - 17.0 g/dL Final  . HCT 07/15/2013 36.9* 39.0 - 52.0 % Final  . MCV 07/15/2013 97.8  78.0 - 100.0 fl Final  . MCHC 07/15/2013 32.8  30.0 - 36.0 g/dL Final  . RDW 07/15/2013 14.6  11.5 - 14.6 % Final      Medication List       This list is accurate as of: 07/16/13  3:28 PM.  Always use your most recent med list.               BIFERA 28 MG Tabs  Take 1 tablet by mouth daily.     chlorpheniramine 4 MG tablet  Commonly known as:  CHLOR-TRIMETON  2 tablets by mouth at bedtime     dextromethorphan-guaiFENesin 30-600 MG per 12 hr tablet  Commonly known as:  MUCINEX DM  1-2 every 12 hours as needed for cough     diphenhydramine-acetaminophen 25-500 MG Tabs  Commonly known as:  TYLENOL PM  Take 2 tablets by mouth at bedtime as needed (insomnia).     docusate sodium 100 MG capsule  Commonly known as:  COLACE  1 capsules by mouth twice daily     estazolam 2 MG tablet  Commonly known as:  PROSOM  Take 1 tablet (2 mg total) by mouth at bedtime.     famotidine 20 MG tablet  Commonly known as:  PEPCID  One at bedtime     finasteride 5 MG tablet  Commonly known as:  PROSCAR  Take 1 tablet (5 mg total) by mouth daily.     FLUTTER Devi  Use as directed.     freestyle lancets  Use to check blood sugars 4 times per day     furosemide 20 MG tablet  Commonly known as:  LASIX  Take 20 mg by mouth daily as needed.     glucose blood test strip  Commonly known as:  FREESTYLE LITE  Use to check blood sugars 4 times per day     insulin glargine 100 UNIT/ML injection  Commonly known as:  LANTUS  Take 20 units before breakfast and supper     insulin lispro 100 UNIT/ML injection  Commonly known as:  HUMALOG  Inject 25  units three times  per day     ipratropium-albuterol 0.5-2.5 (3) MG/3ML Soln  Commonly known as:  DUONEB  Take 3 mLs by nebulization every 4 (four) hours as needed.     mometasone-formoterol 200-5 MCG/ACT Aero  Commonly known as:  DULERA  Inhale 2 puffs into the lungs 2 (two) times daily.     montelukast 10 MG tablet  Commonly known as:  SINGULAIR  One at bedtime every night     multivitamin with minerals Tabs tablet  Take 1 tablet by mouth daily.     pantoprazole 40 MG tablet  Commonly known as:  PROTONIX  TAKE 1 TABLET (40 MG TOTAL) BY MOUTH DAILY. TAKE 30-60 MIN BEFORE FIRST MEAL OF THE DAY     predniSONE 10 MG tablet  Commonly known as:  DELTASONE  Take 1 tablet (10 mg total) by mouth daily with breakfast.     simvastatin 20 MG tablet  Commonly known as:  ZOCOR  Take 1 tablet (20 mg total) by mouth every evening.     TOVIAZ 8 MG Tb24 tablet  Generic drug:  fesoterodine  Take 8 mg by mouth at bedtime.     Vitamin D3 2000 UNITS Tabs  Take 1 capsule by mouth every morning.        Allergies:  Allergies  Allergen Reactions  . Penicillins     Past Medical History  Diagnosis Date  . Diabetes mellitus   . Hypertension   . Anemia   . Chronic renal insufficiency   . Osteoporosis   . Neuromuscular disorder   . DDD (degenerative disc disease), lumbar     Past Surgical History  Procedure Laterality Date  . Vasectomy    . Eye surgery    . Video bronchoscopy Bilateral 08/25/2012    Procedure: VIDEO BRONCHOSCOPY WITHOUT FLUORO;  Surgeon: Brand Males, MD;  Location: [place];  Service: Cardiopulmonary;  Laterality: Bilateral;    Family History  Problem Relation Age of Onset  . Diabetes Mellitus II    . Hypertension      Social History:  reports that he quit smoking about 40 years ago. His smoking use included Cigarettes. He has a 99 pack-year smoking history. He quit smokeless tobacco use about 37 years ago. He reports that he does not drink alcohol or  use illicit drugs.  Review of Systems -   He still has some cough and rattling in chest despite taking prednisone  Previous hypertension: Currently not requiring any medications; no homeBlood pressure monitoring  Edema: Not present at this time, also not taking any Lasix  He has had lower urinary tract problems and BPH, currently taking Proscar and Toviaz from urologist Last visit with him was in 10/14 and his residual urine volume was only 75 mL  HYPERLIPIDEMIA:         The lipid abnormality consists of  minimally elevated LDL, taking simvastatin for cardiovascular protection    Examination:   BP 142/70  Pulse 94  Temp(Src) 98.3 F (36.8 C)  Resp 16  Ht 5' 10.5" (1.791 m)  Wt 235 lb 9.6 oz (106.867 kg)  BMI 33.32 kg/m2  SpO2 95%  Body mass index is 33.32 kg/(m^2).   No edema in his legs or feet  Assesment/PLAN:   1. Diabetes type 2, uncontrolled  The patient's diabetes control appears to be overall fairly good and A1c is reasonably good for him Only has occasional high readings after supper but his overall home average is 134 with checking at least 3  times a day  For now will continue the same insulin Reminded him to reduce morning Humalog if planning to exercise Also may need to reduce all his insulin doses by 2-4 units when prednisone reduced to 5 mg  2.  Renal insufficiency: His creatinine is slightly better but BUN is still relatively higher. Recently has been significantly higher with no obvious causative factors. Will  confirm that he has had  renal ultrasound and nephrology consultation Scheduled.    Ryelan Kazee  07/16/2013, 3:28 PM

## 2013-07-17 ENCOUNTER — Encounter: Payer: Self-pay | Admitting: Internal Medicine

## 2013-07-19 ENCOUNTER — Other Ambulatory Visit: Payer: Self-pay | Admitting: Endocrinology

## 2013-07-20 ENCOUNTER — Telehealth: Payer: Self-pay | Admitting: Internal Medicine

## 2013-07-20 MED ORDER — MONTELUKAST SODIUM 10 MG PO TABS: ORAL_TABLET | ORAL | Status: DC

## 2013-07-20 NOTE — Telephone Encounter (Signed)
Called and spoke with pt. Advised will send in RX. Nothing further needed

## 2013-07-21 ENCOUNTER — Ambulatory Visit
Admission: RE | Admit: 2013-07-21 | Discharge: 2013-07-21 | Disposition: A | Discharge status: Home / Self Care | Payer: Medicare PPO | Source: Ambulatory Visit | Attending: Endocrinology | Admitting: Endocrinology

## 2013-07-21 ENCOUNTER — Other Ambulatory Visit: Payer: Self-pay | Admitting: *Deleted

## 2013-07-21 DIAGNOSIS — IMO0001 Reserved for inherently not codable concepts without codable children: Secondary | ICD-10-CM

## 2013-07-21 DIAGNOSIS — E1165 Type 2 diabetes mellitus with hyperglycemia: Principal | ICD-10-CM

## 2013-07-22 ENCOUNTER — Encounter: Payer: Self-pay | Admitting: Internal Medicine

## 2013-07-22 ENCOUNTER — Ambulatory Visit (INDEPENDENT_AMBULATORY_CARE_PROVIDER_SITE_OTHER): Payer: Medicare PPO | Admitting: Internal Medicine

## 2013-07-22 VITALS — BP 140/68 | HR 99 | Temp 97.7°F | Ht 70.5 in | Wt 237.0 lb

## 2013-07-22 DIAGNOSIS — J45991 Cough variant asthma: Secondary | ICD-10-CM

## 2013-07-22 MED ORDER — CEFDINIR 300 MG PO CAPS: 300.0000 mg | ORAL_CAPSULE | Freq: Two times a day (BID) | ORAL | Status: DC

## 2013-07-22 NOTE — Patient Instructions (Addendum)
Ok to stop Film/video editor  as not helping  omnicef 300 mg twice daily x 10 days to see if changes the color of mucus  See Tammy NP w/in 4 weeks with all your medications, even over the counter meds, separated in two separate bags, the ones you take no matter what vs the ones you stop once you feel better and take only as needed when you feel you need them.   Tammy  will generate for you a new user friendly medication calendar that will put [place] all on the same page re: your medication use.     Without this process, it simply isn't possible to assure that we are providing  your outpatient care  with  the attention to detail we feel you deserve.   If we cannot assure that you're getting that kind of care,  then we cannot manage your problem effectively from this clinic.  Once you have seen Tammy and we are sure that we're all on the same page with your medication use she will arrange follow up Late add:  If still purulent sputum then need fob next step - if no purulent sputum but still bad cough do allergy eval next

## 2013-07-22 NOTE — Progress Notes (Signed)
Subjective:    Patient ID: Clayton [name], male    DOB: 1928/03/05    MRN: [id number]  HPI PCP Lujean Amel, MD HPI #Followup  - Influenza related acute ventilatory respiratory failure on the ventilator and post influenza MRSA pneumonia - January 2014  - False positive Non-small cell lung cancer detected on an endobronchial mucous plug during acute ICU stay January 2014  - deemed false positive based on - PET scan March 2014 and normal bronch March 2014 and 2nd opinion Jan 2014 path by DR Dian Situ  - Chronic cough that predates above  - PFT 09/02/12: His pulmonary function test today is essentially normal except for reduced forced vital capacity, 9% bronchodilator response with FEV1 and in reduced small airway function. Specifically, FEV1 is 2.1 L/80%. FVC 3.1 L/73%. Bronchodilator response FEV1 is 9%. Small airways is 60%. Total lung capacity is 83%. DLCO is 85%.  - March 2014: Normal bronchoscopy   OV 10/13/2012  Follow for chronic cough.  -At last visit on 08/29/2012 started him on a 2 part, nasal steroid, over-the-counter proton pump inhibitor and asked him to continue on DuoNeb and Qvar. In addition started gabapentin and referred him to speech therapy. With these interventions cough is on 50% better and RSI cough score is reduced from 16 to 9. However, he and his daughter Clayton [name] are not happy with the level of improvement. Diffusing quality of life is impaired because he's unable to attend occasional social functions and they feel cough is a social embarrassment. They admit to significant gagging and clearing of the throat. Of note, he did and speech therapy for classes but since has been discharged from followup  - In terms of the supposed to lung cancer got an e-mail from Dr. Dian Situ who feels that his pathology but did false positive atypia and that she is seen cases like this and patient to be acutely ill.  REC #Sleep apnea  -Room air cpap to continue  #Lung cancer - No  evidence of lung cancer as of April 2014 - Pathologist in [place] says likely reaction to acute infection - Followup CT chest 1 year   #Chronic cough Cough is from sinus drainage, possible acid reflux, possible asthma,  All of this is working together to cause cyclical cough/LPR cough otherwise for irritable larynx  #Sinus drainage  - continue netti pot daily  or 3% hypertonic saline nasal spray -  Continue nasal steroid generic fluticasone inhaler 2 squirts each nostril daily as advised  - start asteprol nasal inhaler day time 2 squirts - ENT consult depending on course      #Acute bronchitis  - Take doxycycline 100mg  po twice daily x 5 days; take after meals and avoid sunlight  -Take prednisone 40 mg daily x 2 days, then 20mg  daily x 2 days, then 10mg  daily x 2 days, then 5mg  daily x 2 days and stop  - give sputum for gram stain and culture; will call with results  #Chronic cough  - so far no better and gabapentin not helping  - slow stop gabapentin over 3 weeks  - continue nasal inhalers and oral inhalers for lung  - see ENT physician for chronic cough/.sinus and hearing issues as well  #Followup  2 months with cough score   OV 02/11/2013 qvar not helping so stopped.  Nebs help but Cough overall a bother. RSI score 19 and shows lack of improvement. He is frustrated by the lack of improvement in his cough. Neurontin not helping.  Nasal inhalers helping only partially He shows me each visit take brown sputum. All features upon chronic bronchitis but he really wants relief from his cough. He is open to a second opinion rec  #Chronic cough  - so far no better despite all measures I have tried  - stop QVAR due to lack of relief -continue nebs and other nasal treatment  #Lung nodule  - next scan needed Jan 2015  #Followup Dr Melvyn Novas for 2nd opinion on cough Return to see me JAn 2015 with CT chest   03/04/2013 Clayton [name]/ Consultation/ second opinion:   re persistent cough ever since  the flu requiring ET Chief Complaint  Patient presents with  . Pulmonary Consult    Referred per MR for second opinion on cough. Pt c/o cough since Jan 2014. Cough is prod with moderate pale yellow sputum.  He states that there is no specific thing that triggers the cough. He c/o increased SOB and leg cramps for the past wk. Using neb txs approx 3 times per day.   cough daily ever since tube pulled with waxes and wanes some s pattern x better p neb treatment always productive up to 2 tbsp per day thick yellow mucus >>Levaquin 750 mg one daily x 5 days Prednisone 10 mg take  4 each am x 2 days,   2 each am x 2 days,  1 each am x 2 days and stop  For cough mucinex dm 1200 mg every 12 hours use your flutter ,dulera 100 Take 2 puffs first thing in am and then another 2 puffs about 12 hours later -work on technique Pantoprazole (protonix) 40 mg   Take 30-60 min before first meal of the day and Pepcid 20 mg one bedtime until return to office -    03/18/2013 Follow up and med review  Patient returns for a followup and medication review. We reviewed all his medications and organized them into a medication calendar with patient education Seen 2 weeks ago with Dr. Melvyn Novas for second opinion related to persistent cough. He was given Levaquin 750 mg daily for 5 days a prednisone taper, and started on Dulera twice daily. He was also treated with Protonix, and Pepcid Returns today feeling that his breathing is doing well today; still taking 20mg  prednisone daily. Workup with CXR and CT sinus unrevealing.  He did not get mucinex DM as recommended and is using duoneb as scheduled med.  Overall  Cough is much better with significant drop in coughing but not totally cough free.  No hemoptyisis, chest pain , orthopnea, edema.  Did not taper prednisone as directed.  Last ov labs showed IGE elevated 707 , +rast , bnp nml. Cbc w/ no eosinophils elevation.  rec Decrease prednisone 10mg  daily for 1 week then 1/2 daily  for 1 week and stop > found did better on pred 20 (75%) Add Mucinex DM Twice daily  As needed  Cough.  Add flutter valve As needed  For cough Brush/rinse and gargle after inhaler use.  Follow med calendar closely and bring to each visit.  follow up Dr. Melvyn Novas  In 3 weeks and As needed     04/08/2013 f/u ov/Clayton [name] re: chronic cough > sob  since Jan 2014 extubation  Chief Complaint  Patient presents with  . Follow-up    Pt states that his breathing seems to be doing some better. His cough is more prod with pale yellow sputum- esp on days when only takes 10 m pred daily.  Not clear he's following med calendar "I have my own system" rec Increase dulera to 200 Take 2 puffs first thing in am and then another 2 puffs about 12 hours later.  Add singulair 10 mg one each bedtime Use mucinex dm and flutter valve as per calendar   04/22/2013 f/u ov/Clayton [name] re: chronic cough with marked elevation of IgE c/w cough variant asthma Chief Complaint  Patient presents with  . Follow-up    Breathing is slightly better. Productive cough pale yellow phlem. No wheezing. No chest tx   Organization of meds in bags but not correlating with calendar - not sure he's following any of the instructions given.   Typically does respond somewhat to short courses of prednisone. >>pred 20mg    05/13/2013 Follow up and med review  Patient returns for a followup and medication review. We reviewed all his medications and organized them into a medication calendar with patient education Appears he is taking her meds correctly.  Last ov was recommended to increase prednisone 20mg  daily. Did not see much improvement in cough.  Cough is productive with clear to light yellow mucus on/off . Using mucinex DM with some help.  He remains acitve.  Rides bike often, -now rides. recumbent trike.  Avid cyclist-has ridden in all 50 states.  rec Add Chlorpheniramine 4mg  2 tabs At bedtime   Change to Mucinex 1-2 Twice daily  As needed   Cough/congestion  Add Delsym 2 tsp Twice daily  As needed  Cough  Decrease Prednisone 10mg  daily  Follow med calendar closely and bring to each visit.  Goal is to not cough, use sugarless candy and water to help soothe throat  Avoid coughing and throat clearing  NO MINTS.   06/24/2013 f/u ov/Clayton [name] re: cough/ prednisone 10 mg daily  Chief Complaint  Patient presents with  . Follow-up    Pt states cough is some improved since his last visit. No new co's today.   able to bike up to 11 miles per day vs 17 prior to ET but not biking as much due to weather.   No resp medications at all prior to his flu in Jan 2014  Sleeps well and no flare in am of cough until stirs around then gets up a little slt yellow mucus. rec Reduce prednisone to 10 mg one half daily Increase chlortrimeton to 4 mg 2 at bedtime as per calendar For cough > mucinex dm up to 1200 mg every 12 hours and use flutter as much as possible  Please schedule a follow up office visit in 4 weeks, sooner if needed    07/22/2013 f/u ov/Clayton [name] re: chronic cough / steroid dep ? Cough variant asthma   Chief Complaint  Patient presents with  . Follow-up    Pt states cough is about 50% better. Still prod with yellow sputum.       No obvious day to day or daytime variabilty or assoc cp or chest tightness, subjective wheeze overt sinus or hb symptoms. No unusual exp hx or h/o childhood pna/ asthma or knowledge of premature birth.  Sleeping ok without nocturnal  or early am exacerbation  of respiratory  c/o's or need for noct saba. Also denies any obvious fluctuation of symptoms with weather or environmental changes or other aggravating or alleviating factors except as outlined above   Current Medications, Allergies, Complete Past Medical History, Past Surgical History, Family History, and Social History were reviewed in [employer] record.  ROS  The following  are not active complaints unless bolded sore throat,  dysphagia, dental problems, itching, sneezing,  nasal congestion or excess/ purulent secretions, ear ache,   fever, chills, sweats, unintended wt loss, pleuritic or exertional cp, hemoptysis,  orthopnea pnd or leg swelling, presyncope, palpitations, heartburn, abdominal pain, anorexia, nausea, vomiting, diarrhea  or change in bowel or urinary habits, change in stools or urine, dysuria,hematuria,  rash, arthralgias, visual complaints, headache, numbness weakness or ataxia or problems with walking or coordination,  change in mood/affect or memory.                 Objective:   Physical Exam  amb obese wm nad with  Rattling cough   07/22/2013         237  Wt Readings from Last 3 Encounters:  06/24/13 239 lb (108.41 kg)  05/13/13 232 lb 9.6 oz (105.507 kg)  04/22/13 236 lb 6.4 oz (107.23 kg)       Head: Normocephalic and atraumatic.  Right Ear: External ear normal.  Left Ear: External ear normal.  Mouth/Throat: Oropharynx is clear and moist. No oropharyngeal exudate.  Eyes: Conjunctivae and EOM are normal. Pupils are equal, round, and reactive to light. Right eye exhibits no discharge. Left eye exhibits no discharge. No scleral icterus.  Neck: Normal range of motion. Neck supple. No JVD present. No tracheal deviation present. No thyromegaly present.  Cardiovascular: Normal rate, regular rhythm and intact distal pulses.  Exam reveals no gallop and no friction rub.   No murmur heard. Pulmonary/Chest : bilateral late exp rhonchi bilaterally Abdominal: Soft. Bowel sounds are normal. He exhibits no distension and no mass. There is no tenderness. There is no rebound and no guarding.  Musculoskeletal: Normal range of motion. He exhibits no edema and no tenderness.  Lymphadenopathy:    He has no cervical adenopathy.  Neurological: He is alert and oriented to person, place, and time. He has normal reflexes. No cranial nerve deficit. Coordination normal.  Skin: Skin is warm and dry. No rash noted.  He is not diaphoretic. No erythema. No pallor.  Psychiatric: He has a normal mood and affect. His behavior is normal. Judgment and thought content normal.     CXR  03/04/2013 : Streaky left basilar atelectasis, scarring or infiltrate.    CT sinus 03/05/2013 Mild deviation of the nasal septum to the right. No evidence of sinusitis.      Assessment & Plan:

## 2013-07-23 NOTE — Assessment & Plan Note (Addendum)
-   pfts 08/18/12  FEV1    2.11 ( 80%) ratio 69  -  Spirometry 06/24/13  2.10 (65%) ratio 68 - Sinus CT 03/05/2013 > Mild deviation of the nasal septum to the right. No evidence of  Sinusitis. - Allergy profile 03/05/13 > IgE 707 mulitple resp allergens identified including dogs/cats/ragweed/ trees  -- hfa 90% 04/08/2013  - Singulair 04/08/13 > stopped 07/23/2013  - daily pred rx 04/22/13  -med calendar 05/13/2013  - spirometry 06/24/2013  FEV 1  2.10 (65%) ratio 68  - 06/24/2013 p extensive coaching HFA effectiveness =    90%    No benefit to singulair so rec d/c - also not using duoneb, doesn't think it helps so d/c also  I had an extended discussion with the patient today lasting 15 to 20 minutes of a 25 minute visit on the following issues: He has reached a plateau in terms of cough/ sob management and still reports brown mucus each am despite neg sinus and Chest CT scans showing no sinusitis or bronchiectasis - will give 10 days of omnicef then consider re-fob looking for eviendence of suppurative bronchitis or eosinophilic bronchitis  In meantime:   Each maintenance medication was reviewed in detail including most importantly the difference between maintenance and as needed and under what circumstances the prns are to be used. This was done in the context of a medication calendar review which provided the patient with a user-friendly unambiguous mechanism for medication administration and reconciliation and provides an action plan for all active problems. It is critical that this be shown to every doctor  for modification during the office visit if necessary so the patient can use it as a working document.

## 2013-07-27 ENCOUNTER — Telehealth: Payer: Self-pay | Admitting: Internal Medicine

## 2013-07-27 MED ORDER — MOMETASONE FURO-FORMOTEROL FUM 200-5 MCG/ACT IN AERO: 2.0000 | INHALATION_SPRAY | Freq: Two times a day (BID) | RESPIRATORY_TRACT | Status: DC

## 2013-07-27 NOTE — Telephone Encounter (Signed)
Pt advised. He asked that rx be sent to [facility]. rx sent. [place] Bing, CMA

## 2013-07-27 NOTE — Telephone Encounter (Signed)
[phone] pt is calling back

## 2013-07-27 NOTE — Telephone Encounter (Signed)
We do not have samples of Dulera at this time. Attempted to call pt, line continued to ring but then would stop. Will try back later.

## 2013-07-28 ENCOUNTER — Telehealth: Payer: Self-pay | Admitting: Internal Medicine

## 2013-07-28 NOTE — Telephone Encounter (Signed)
Sorry to hear that - would prefer then it be symbicort 160 2 bid but I don't know what tier it is - he can check it

## 2013-07-28 NOTE — Telephone Encounter (Signed)
Patient states he is returning call. 

## 2013-07-28 NOTE — Telephone Encounter (Signed)
Called and spoke with pt. He reports the dulera is over $200. Was told this is tier 4. [employer] is pt insurance. He went ahead and paid for this out of pocket. He is requesting an alternative. Please advise MW thanks

## 2013-07-28 NOTE — Telephone Encounter (Signed)
Called and spoke with pt. He will call his insurance. Nothing further needed

## 2013-07-28 NOTE — Telephone Encounter (Signed)
lmomtcb x1- don't see where we tried calling pt

## 2013-07-29 ENCOUNTER — Telehealth: Payer: Self-pay | Admitting: Internal Medicine

## 2013-07-29 MED ORDER — BUDESONIDE-FORMOTEROL FUMARATE 160-4.5 MCG/ACT IN AERO: 2.0000 | INHALATION_SPRAY | Freq: Two times a day (BID) | RESPIRATORY_TRACT | Status: DC

## 2013-07-29 NOTE — Telephone Encounter (Signed)
Per 2.10.15 phone note, pt's insurance does not cover Dulera.  MW rec'd pt take Symbicort, but asked pt to check with his insurance company to determine coverage.  Called spoke with patient who stated that he spoke with his insurance company who informed him that Advair and Symbicort are covered alternatives.  Reminded pt that Symbicort was MW's choice alternative to the [facility].  Pt okay with this and verbalized his understanding.  [facility] [place]  Advised pt that Symbicort is taken like the [facility] - 2 puffs twice daily.  Pt verbalized his understanding.  Med list updated; rx sent.  Will sign off.

## 2013-07-31 ENCOUNTER — Other Ambulatory Visit: Payer: Medicare PPO

## 2013-08-05 ENCOUNTER — Ambulatory Visit: Payer: Medicare PPO | Admitting: Endocrinology

## 2013-08-18 ENCOUNTER — Other Ambulatory Visit: Payer: Self-pay | Admitting: Endocrinology

## 2013-08-24 ENCOUNTER — Other Ambulatory Visit: Payer: Self-pay | Admitting: *Deleted

## 2013-08-24 MED ORDER — FREESTYLE LANCETS MISC: Status: DC

## 2013-08-24 MED ORDER — GLUCOSE BLOOD VI STRP: ORAL_STRIP | Status: DC

## 2013-08-26 ENCOUNTER — Ambulatory Visit (INDEPENDENT_AMBULATORY_CARE_PROVIDER_SITE_OTHER): Payer: Medicare PPO | Admitting: Adult Health

## 2013-08-26 ENCOUNTER — Telehealth: Payer: Self-pay | Admitting: Internal Medicine

## 2013-08-26 ENCOUNTER — Encounter: Payer: Self-pay | Admitting: Adult Health

## 2013-08-26 VITALS — BP 140/90 | HR 94 | Temp 98.3°F | Ht 70.5 in | Wt 245.4 lb

## 2013-08-26 DIAGNOSIS — J45991 Cough variant asthma: Secondary | ICD-10-CM

## 2013-08-26 NOTE — Patient Instructions (Signed)
Decrease Prednisone 10mg  1/2 daily for 2 weeks, then 1/2 every other day for 1 week and stop .  Watch blood sugars as they may drop off steroids .  Stop singulair .  Follow med calendar closely and bring to each visit.  Follow up Dr. Melvyn Novas  In 4 weeks and As needed

## 2013-08-26 NOTE — Telephone Encounter (Signed)
Pt states he gave [employer] paper to TP; he needs this faxed to [phone].

## 2013-08-26 NOTE — Progress Notes (Signed)
Subjective:    Patient ID: Clayton [name], male    DOB: 1927-11-23    MRN: [id number]  HPI PCP Lujean Amel, MD HPI #Followup  - Influenza related acute ventilatory respiratory failure on the ventilator and post influenza MRSA pneumonia - January 2014  - False positive Non-small cell lung cancer detected on an endobronchial mucous plug during acute ICU stay January 2014  - deemed false positive based on - PET scan March 2014 and normal bronch March 2014 and 2nd opinion Jan 2014 path by DR Dian Situ  - Chronic cough that predates above  - PFT 09/02/12: His pulmonary function test today is essentially normal except for reduced forced vital capacity, 9% bronchodilator response with FEV1 and in reduced small airway function. Specifically, FEV1 is 2.1 L/80%. FVC 3.1 L/73%. Bronchodilator response FEV1 is 9%. Small airways is 60%. Total lung capacity is 83%. DLCO is 85%.  - March 2014: Normal bronchoscopy   OV 10/13/2012  Follow for chronic cough.  -At last visit on 08/29/2012 started him on a 2 part, nasal steroid, over-the-counter proton pump inhibitor and asked him to continue on DuoNeb and Qvar. In addition started gabapentin and referred him to speech therapy. With these interventions cough is on 50% better and RSI cough score is reduced from 16 to 9. However, he and his [name] are not happy with the level of improvement. Diffusing quality of life is impaired because he's unable to attend occasional social functions and they feel cough is a social embarrassment. They admit to significant gagging and clearing of the throat. Of note, he did and speech therapy for classes but since has been discharged from followup  - In terms of the supposed to lung cancer got an e-mail from Dr. Dian Situ who feels that his pathology but did false positive atypia and that she is seen cases like this and patient to be acutely ill.  REC #Sleep apnea  -Room air cpap to continue  #Lung cancer - No  evidence of lung cancer as of April 2014 - Pathologist in [place] says likely reaction to acute infection - Followup CT chest 1 year   #Chronic cough Cough is from sinus drainage, possible acid reflux, possible asthma,  All of this is working together to cause cyclical cough/LPR cough otherwise for irritable larynx  #Sinus drainage  - continue netti pot daily  or 3% hypertonic saline nasal spray -  Continue nasal steroid generic fluticasone inhaler 2 squirts each nostril daily as advised  - start asteprol nasal inhaler day time 2 squirts - ENT consult depending on course      #Acute bronchitis  - Take doxycycline 100mg  po twice daily x 5 days; take after meals and avoid sunlight  -Take prednisone 40 mg daily x 2 days, then 20mg  daily x 2 days, then 10mg  daily x 2 days, then 5mg  daily x 2 days and stop  - give sputum for gram stain and culture; will call with results  #Chronic cough  - so far no better and gabapentin not helping  - slow stop gabapentin over 3 weeks  - continue nasal inhalers and oral inhalers for lung  - see ENT physician for chronic cough/.sinus and hearing issues as well  #Followup  2 months with cough score   OV 02/11/2013 qvar not helping so stopped.  Nebs help but Cough overall a bother. RSI score 19 and shows lack of improvement. He is frustrated by the lack of improvement in his cough. Neurontin not helping.  Nasal inhalers helping only partially He shows me each visit take brown sputum. All features upon chronic bronchitis but he really wants relief from his cough. He is open to a second opinion rec  #Chronic cough  - so far no better despite all measures I have tried  - stop QVAR due to lack of relief -continue nebs and other nasal treatment  #Lung nodule  - next scan needed Jan 2015  #Followup Dr Melvyn Novas for 2nd opinion on cough Return to see me JAn 2015 with CT chest   03/04/2013 Wert/ Consultation/ second opinion:   re persistent cough ever since  the flu requiring ET Chief Complaint  Patient presents with  . Pulmonary Consult    Referred per MR for second opinion on cough. Pt c/o cough since Jan 2014. Cough is prod with moderate pale yellow sputum.  He states that there is no specific thing that triggers the cough. He c/o increased SOB and leg cramps for the past wk. Using neb txs approx 3 times per day.   cough daily ever since tube pulled with waxes and wanes some s pattern x better p neb treatment always productive up to 2 tbsp per day thick yellow mucus >>Levaquin 750 mg one daily x 5 days Prednisone 10 mg take  4 each am x 2 days,   2 each am x 2 days,  1 each am x 2 days and stop  For cough mucinex dm 1200 mg every 12 hours use your flutter ,dulera 100 Take 2 puffs first thing in am and then another 2 puffs about 12 hours later -work on technique Pantoprazole (protonix) 40 mg   Take 30-60 min before first meal of the day and Pepcid 20 mg one bedtime until return to office -    03/18/2013 Follow up and med review  Patient returns for a followup and medication review. We reviewed all his medications and organized them into a medication calendar with patient education Seen 2 weeks ago with Dr. Melvyn Novas for second opinion related to persistent cough. He was given Levaquin 750 mg daily for 5 days a prednisone taper, and started on Dulera twice daily. He was also treated with Protonix, and Pepcid Returns today feeling that his breathing is doing well today; still taking 20mg  prednisone daily. Workup with CXR and CT sinus unrevealing.  He did not get mucinex DM as recommended and is using duoneb as scheduled med.  Overall  Cough is much better with significant drop in coughing but not totally cough free.  No hemoptyisis, chest pain , orthopnea, edema.  Did not taper prednisone as directed.  Last ov labs showed IGE elevated 707 , +rast , bnp nml. Cbc w/ no eosinophils elevation.  rec Decrease prednisone 10mg  daily for 1 week then 1/2 daily  for 1 week and stop > found did better on pred 20 (75%) Add Mucinex DM Twice daily  As needed  Cough.  Add flutter valve As needed  For cough Brush/rinse and gargle after inhaler use.  Follow med calendar closely and bring to each visit.  follow up Dr. Melvyn Novas  In 3 weeks and As needed     04/08/2013 f/u ov/Wert re: chronic cough > sob  since Jan 2014 extubation  Chief Complaint  Patient presents with  . Follow-up    Pt states that his breathing seems to be doing some better. His cough is more prod with pale yellow sputum- esp on days when only takes 10 m pred daily.  Not clear he's following med calendar "I have my own system" rec Increase dulera to 200 Take 2 puffs first thing in am and then another 2 puffs about 12 hours later.  Add singulair 10 mg one each bedtime Use mucinex dm and flutter valve as per calendar   04/22/2013 f/u ov/Wert re: chronic cough with marked elevation of IgE c/w cough variant asthma Chief Complaint  Patient presents with  . Follow-up    Breathing is slightly better. Productive cough pale yellow phlem. No wheezing. No chest tx   Organization of meds in bags but not correlating with calendar - not sure he's following any of the instructions given.   Typically does respond somewhat to short courses of prednisone. >>pred 20mg    05/13/2013 Follow up and med review  Patient returns for a followup and medication review. We reviewed all his medications and organized them into a medication calendar with patient education Appears he is taking her meds correctly.  Last ov was recommended to increase prednisone 20mg  daily. Did not see much improvement in cough.  Cough is productive with clear to light yellow mucus on/off . Using mucinex DM with some help.  He remains acitve.  Rides bike often, -now rides. recumbent trike.  Avid cyclist-has ridden in all 50 states.  rec Add Chlorpheniramine 4mg  2 tabs At bedtime   Change to Mucinex 1-2 Twice daily  As needed   Cough/congestion  Add Delsym 2 tsp Twice daily  As needed  Cough  Decrease Prednisone 10mg  daily  Follow med calendar closely and bring to each visit.  Goal is to not cough, use sugarless candy and water to help soothe throat  Avoid coughing and throat clearing  NO MINTS.   06/24/2013 f/u ov/Wert re: cough/ prednisone 10 mg daily  Chief Complaint  Patient presents with  . Follow-up    Pt states cough is some improved since his last visit. No new co's today.   able to bike up to 11 miles per day vs 17 prior to ET but not biking as much due to weather.   No resp medications at all prior to his flu in Jan 2014  Sleeps well and no flare in am of cough until stirs around then gets up a little slt yellow mucus. rec Reduce prednisone to 10 mg one half daily Increase chlortrimeton to 4 mg 2 at bedtime as per calendar For cough > mucinex dm up to 1200 mg every 12 hours and use flutter as much as possible  Please schedule a follow up office visit in 4 weeks, sooner if needed    07/22/2013 f/u ov/Wert re: chronic cough / steroid dep ? Cough variant asthma   Chief Complaint  Patient presents with  . Follow-up    Pt states cough is about 50% better. Still prod with yellow sputum.   >>Omnicef x 10 d   08/26/2013 Follow up and Med Review  Returns for follow up and med review  We reviewed all his meds and organized them into a med calendar.  Singulair stopped last ov, forgot to stop. Discussed plan today  Remains on prednisone 10mg  daily .  Last ov rx Omnicef , cough is better, minimal mucus.  Feels he is improved overall.  Patient denies any hemoptysis, orthopnea, PND, or leg swelling  Current Medications, Allergies, Complete Past Medical History, Past Surgical History, Family History, and Social History were reviewed in [employer] record.  ROS  The following are not active complaints  unless bolded sore throat, dysphagia, dental problems, itching, sneezing,  nasal  congestion or excess/ purulent secretions, ear ache,   fever, chills, sweats, unintended wt loss, pleuritic or exertional cp, hemoptysis,  orthopnea pnd or leg swelling, presyncope, palpitations, heartburn, abdominal pain, anorexia, nausea, vomiting, diarrhea  or change in bowel or urinary habits, change in stools or urine, dysuria,hematuria,  rash, arthralgias, visual complaints, headache, numbness weakness or ataxia or problems with walking or coordination,  change in mood/affect or memory.                 Objective:   Physical Exam  amb obese wm nad  07/22/2013         237 >245 08/26/2013      Head: Normocephalic and atraumatic.  Right Ear: External ear normal.  Left Ear: External ear normal.  Mouth/Throat: Oropharynx is clear and moist. No oropharyngeal exudate.  Eyes: Conjunctivae and EOM are normal. Pupils are equal, round, and reactive to light. Right eye exhibits no discharge. Left eye exhibits no discharge. No scleral icterus.  Neck: Normal range of motion. Neck supple. No JVD present. No tracheal deviation present. No thyromegaly present.  Cardiovascular: Normal rate, regular rhythm and intact distal pulses.  Exam reveals no gallop and no friction rub.   No murmur heard. Pulmonary/Chest : CTA , rhonchi clears with cough.  Abdominal: Soft. Bowel sounds are normal. He exhibits no distension and no mass. There is no tenderness. There is no rebound and no guarding.  Musculoskeletal: Normal range of motion. He exhibits no edema and no tenderness.  Lymphadenopathy:    He has no cervical adenopathy.  Neurological: He is alert and oriented to person, place, and time. He has normal reflexes. No cranial nerve deficit. Coordination normal.  Skin: Skin is warm and dry. No rash noted. He is not diaphoretic. No erythema. No pallor.  Psychiatric: He has a normal mood and affect. His behavior is normal. Judgment and thought content normal.     CXR  03/04/2013 : Streaky left basilar  atelectasis, scarring or infiltrate.    CT sinus 03/05/2013 Mild deviation of the nasal septum to the right. No evidence of sinusitis.      Assessment & Plan:

## 2013-08-26 NOTE — Addendum Note (Signed)
Addended by: Parke Poisson E on: 08/26/2013 11:46 AM   Modules accepted: Orders, Medications

## 2013-08-26 NOTE — Assessment & Plan Note (Signed)
Improved on present regimen  Patient's medications were reviewed today and patient education was given. Computerized medication calendar was adjusted/completed   Plan  Decrease Prednisone 10mg  1/2 daily for 2 weeks, then 1/2 every other day for 1 week and stop .  Watch blood sugars as they may drop off steroids .  Stop singulair .  Follow med calendar closely and bring to each visit.  Follow up Dr. Melvyn Novas  In 4 weeks and As needed

## 2013-08-28 NOTE — Telephone Encounter (Signed)
Called spoke with patient -- no sign of pt's insurance paper.  Asked what the letter was for.  Per pt the letter was for a Urology med that will no longer be covered and had the covered medication.  Pt stated that he has searched all thru his paperwork and cannot locate the letter.    Asked pt what the medication is that he is taking now > Detrol LA.   Asked pt what pharmacy he gets this filled at [place]  Informed pt that since he misplaced the letter here, will get the necessary info from [facility] and relay it to Dr McDermit's office.  Pt stated there is no rush on this as he has plenty of medication and can be done next week.  Will route back to my inbox to complete next week.

## 2013-09-03 NOTE — Telephone Encounter (Signed)
Called [facility] and spoke with pharmacist [place].  Per [place], pt last picked up his Detrol LA on 2.24.15, and insurance paid for it at that time.  She ran the rx again and showed no issues with pt's insurance paying for it again -- no PA needed as of now.  LMOM TCB x1 for pt - he can try calling his urologist to see if they have received anything.

## 2013-09-03 NOTE — Telephone Encounter (Signed)
Clayton [name], has this been completed. Please advise. Thanks.

## 2013-09-03 NOTE — Telephone Encounter (Signed)
Pt returned my call.  He stated that he was able to locate his letter at home with his medications and that nothing further is needed.  Will sign off.

## 2013-09-04 ENCOUNTER — Other Ambulatory Visit: Payer: Self-pay | Admitting: *Deleted

## 2013-09-04 MED ORDER — INSULIN GLARGINE 100 UNIT/ML ~~LOC~~ SOLN: SUBCUTANEOUS | Status: DC

## 2013-09-09 ENCOUNTER — Telehealth: Payer: Self-pay | Admitting: Internal Medicine

## 2013-09-09 ENCOUNTER — Ambulatory Visit (INDEPENDENT_AMBULATORY_CARE_PROVIDER_SITE_OTHER): Payer: Medicare PPO | Admitting: Internal Medicine

## 2013-09-09 ENCOUNTER — Encounter: Payer: Self-pay | Admitting: Internal Medicine

## 2013-09-09 VITALS — BP 140/80 | HR 100 | Temp 97.8°F | Ht 70.5 in | Wt 238.0 lb

## 2013-09-09 DIAGNOSIS — J45991 Cough variant asthma: Secondary | ICD-10-CM

## 2013-09-09 MED ORDER — MONTELUKAST SODIUM 10 MG PO TABS: ORAL_TABLET | ORAL | Status: DC

## 2013-09-09 MED ORDER — PREDNISONE 10 MG PO TABS: ORAL_TABLET | ORAL | Status: DC

## 2013-09-09 MED ORDER — CEFDINIR 300 MG PO CAPS: 300.0000 mg | ORAL_CAPSULE | Freq: Two times a day (BID) | ORAL | Status: DC

## 2013-09-09 NOTE — Telephone Encounter (Signed)
Called spoke with pt. appt scheduled with MW this afternoon. Nothing further needed

## 2013-09-09 NOTE — Progress Notes (Signed)
Subjective:    Patient ID: Clayton [name], male    DOB: 03-Jun-1928    MRN: [id number]  HPI PCP Clayton Amel, MD HPI #Followup  - Influenza related acute ventilatory respiratory failure on the ventilator and post influenza MRSA pneumonia - January 2014  - False positive Non-small cell lung cancer detected on an endobronchial mucous plug during acute ICU stay January 2014  - deemed false positive based on - PET scan March 2014 and normal bronch March 2014 and 2nd opinion Jan 2014 path by DR Clayton [name]  - Chronic cough that predates above  - PFT 09/02/12: His pulmonary function test today is essentially normal except for reduced forced vital capacity, 9% bronchodilator response with FEV1 and in reduced small airway function. Specifically, FEV1 is 2.1 L/80%. FVC 3.1 L/73%. Bronchodilator response FEV1 is 9%. Small airways is 60%. Total lung capacity is 83%. DLCO is 85%.  - March 2014: Normal bronchoscopy   OV 10/13/2012  Follow for chronic cough.  -At last visit on 08/29/2012 started him on a 2 part, nasal steroid, over-the-counter proton pump inhibitor and asked him to continue on DuoNeb and Qvar. In addition started gabapentin and referred him to speech therapy. With these interventions cough is on 50% better and RSI cough score is reduced from 16 to 9. However, he and his daughter Clayton [name] are not happy with the level of improvement. Diffusing quality of life is impaired because he's unable to attend occasional social functions and they feel cough is a social embarrassment. They admit to significant gagging and clearing of the throat. Of note, he did and speech therapy for classes but since has been discharged from followup  - In terms of the supposed to lung cancer got an e-mail from Dr. Dian [name] who feels that his pathology but did false positive atypia and that she is seen cases like this and patient to be acutely ill.  REC #Sleep apnea  -Room air cpap to continue  #Lung cancer - No  evidence of lung cancer as of April 2014 - Pathologist in [place] says likely reaction to acute infection - Followup CT chest 1 year   #Chronic cough Cough is from sinus drainage, possible acid reflux, possible asthma,  All of this is working together to cause cyclical cough/LPR cough otherwise for irritable larynx  #Sinus drainage  - continue netti pot daily  or 3% hypertonic saline nasal spray -  Continue nasal steroid generic fluticasone inhaler 2 squirts each nostril daily as advised  - start asteprol nasal inhaler day time 2 squirts - ENT consult depending on course      #Acute bronchitis  - Take doxycycline 100mg  po twice daily x 5 days; take after meals and avoid sunlight  -Take prednisone 40 mg daily x 2 days, then 20mg  daily x 2 days, then 10mg  daily x 2 days, then 5mg  daily x 2 days and stop  - give sputum for gram stain and culture; will call with results  #Chronic cough  - so far no better and gabapentin not helping  - slow stop gabapentin over 3 weeks  - continue nasal inhalers and oral inhalers for lung  - see ENT physician for chronic cough/.sinus and hearing issues as well  #Followup  2 months with cough score   OV 02/11/2013 qvar not helping so stopped.  Nebs help but Cough overall a bother. RSI score 19 and shows lack of improvement. He is frustrated by the lack of improvement in his cough. Neurontin not helping.  Nasal inhalers helping only partially He shows me each visit take brown sputum. All features upon chronic bronchitis but he really wants relief from his cough. He is open to a second opinion rec  #Chronic cough  - so far no better despite all measures I have tried  - stop QVAR due to lack of relief -continue nebs and other nasal treatment  #Lung nodule  - next scan needed Jan 2015  #Followup Dr Clayton [name] for 2nd opinion on cough Return to see me JAn 2015 with CT chest   03/04/2013 Clayton [name]/ Consultation/ second opinion:   re persistent cough ever since  the flu requiring ET Chief Complaint  Patient presents with  . Pulmonary Consult    Referred per MR for second opinion on cough. Pt c/o cough since Jan 2014. Cough is prod with moderate pale yellow sputum.  He states that there is no specific thing that triggers the cough. He c/o increased SOB and leg cramps for the past wk. Using neb txs approx 3 times per day.   cough daily ever since tube pulled with waxes and wanes some s pattern x better p neb treatment always productive up to 2 tbsp per day thick yellow mucus >>Levaquin 750 mg one daily x 5 days Prednisone 10 mg take  4 each am x 2 days,   2 each am x 2 days,  1 each am x 2 days and stop  For cough mucinex dm 1200 mg every 12 hours use your flutter ,dulera 100 Take 2 puffs first thing in am and then another 2 puffs about 12 hours later -work on technique Pantoprazole (protonix) 40 mg   Take 30-60 min before first meal of the day and Pepcid 20 mg one bedtime until return to office -    03/18/2013 Follow up and med review  Patient returns for a followup and medication review. We reviewed all his medications and organized them into a medication calendar with patient education Seen 2 weeks ago with Dr. Melvyn [name] for second opinion related to persistent cough. He was given Levaquin 750 mg daily for 5 days a prednisone taper, and started on Dulera twice daily. He was also treated with Protonix, and Pepcid Returns today feeling that his breathing is doing well today; still taking 20mg  prednisone daily. Workup with CXR and CT sinus unrevealing.  He did not get mucinex DM as recommended and is using duoneb as scheduled med.  Overall  Cough is much better with significant drop in coughing but not totally cough free.  No hemoptyisis, chest pain , orthopnea, edema.  Did not taper prednisone as directed.  Last ov labs showed IGE elevated 707 , +rast , bnp nml. Cbc w/ no eosinophils elevation.  rec Decrease prednisone 10mg  daily for 1 week then 1/2 daily  for 1 week and stop > found did better on pred 20 (75%) Add Mucinex DM Twice daily  As needed  Cough.  Add flutter valve As needed  For cough Brush/rinse and gargle after inhaler use.  Follow med calendar closely and bring to each visit.  follow up Dr. Melvyn [name]  In 3 weeks and As needed     04/08/2013 f/u ov/Catha Ontko re: chronic cough > sob  since Jan 2014 extubation  Chief Complaint  Patient presents with  . Follow-up    Pt states that his breathing seems to be doing some better. His cough is more prod with pale yellow sputum- esp on days when only takes 10 m pred daily.  Not clear he's following med calendar "I have my own system" rec Increase dulera to 200 Take 2 puffs first thing in am and then another 2 puffs about 12 hours later.  Add singulair 10 mg one each bedtime Use mucinex dm and flutter valve as per calendar   04/22/2013 f/u ov/Aylanie Cubillos re: chronic cough with marked elevation of IgE c/w cough variant asthma Chief Complaint  Patient presents with  . Follow-up    Breathing is slightly better. Productive cough pale yellow phlem. No wheezing. No chest tx   Organization of meds in bags but not correlating with calendar - not sure he's following any of the instructions given.   Typically does respond somewhat to short courses of prednisone. >>pred 20mg    05/13/2013 Follow up and med review  Patient returns for a followup and medication review. We reviewed all his medications and organized them into a medication calendar with patient education Appears he is taking her meds correctly.  Last ov was recommended to increase prednisone 20mg  daily. Did not see much improvement in cough.  Cough is productive with clear to light yellow mucus on/off . Using mucinex DM with some help.  He remains acitve.  Rides bike often, -now rides. recumbent trike.  Avid cyclist-has ridden in all [place] states.  rec Add Chlorpheniramine 4mg  2 tabs At bedtime   Change to Mucinex 1-2 Twice daily  As needed   Cough/congestion  Add Delsym 2 tsp Twice daily  As needed  Cough  Decrease Prednisone 10mg  daily  Follow med calendar closely and bring to each visit.  Goal is to not cough, use sugarless candy and water to help soothe throat  Avoid coughing and throat clearing  NO MINTS.   06/24/2013 f/u ov/Daelon Dunivan re: cough/ prednisone 10 mg daily  Chief Complaint  Patient presents with  . Follow-up    Pt states cough is some improved since his last visit. No new co's today.   able to bike up to 11 miles per day vs 17 prior to ET but not biking as much due to weather.   No resp medications at all prior to his flu in Jan 2014  Sleeps well and no flare in am of cough until stirs around then gets up a little slt yellow mucus. rec Reduce prednisone to 10 mg one half daily Increase chlortrimeton to 4 mg 2 at bedtime as per calendar For cough > mucinex dm up to 1200 mg every 12 hours and use flutter as much as possible  Please schedule a follow up office visit in 4 weeks, sooner if needed    07/22/2013 f/u ov/Jamyiah Labella re: chronic cough / steroid dep ? Cough variant asthma   Chief Complaint  Patient presents with  . Follow-up    Pt states cough is about 50% better. Still prod with yellow sputum.   >>Omnicef x 10 d  Ok to stop duoneb and singulair  as not helping omnicef 300 mg twice daily x 10 days to see if changes the color of mucus if still purulent sputum then need fob next step - if no purulent sputum but still bad cough do allergy eval next   08/26/2013 Follow up and Med Review  Returns for follow up and med review  We reviewed all his meds and organized them into a med calendar.  Singulair stopped last ov, forgot to stop. Discussed plan today  Remains on prednisone 10mg  daily .  Last ov rx Omnicef , cough is better, minimal mucus.  Feels he is improved overall.  rec Decrease Prednisone 10mg  1/2 daily for 2 weeks, then 1/2 every other day for 1 week and stop .  Watch blood sugars as they may drop off  steroids .  Stop singulair .  Follow med calendar closely and bring to each visit.     09/09/2013 f/u ov/Mimie Goering re: recurrent cough/ did not bring med calendar/ still on symbicort 160 2bid but ? Just samples? Chief Complaint  Patient presents with  . Acute Visit    Pt c/o increased cough and "rattling" in chest x 1 wk. Cough is prod with minimal to moderate pale yellow sputum.     Recurred w/in a week of stopping pred/ singulair  No obvious day to day or daytime variabilty or assoc sob or cp or chest tightness, subjective wheeze overt sinus or hb symptoms. No unusual exp hx or h/o childhood pna/ asthma or knowledge of premature birth.  Sleeping ok without nocturnal  or early am exacerbation  of respiratory  c/o's or need for noct saba. Also denies any obvious fluctuation of symptoms with weather or environmental changes or other aggravating or alleviating factors except as outlined above   Current Medications, Allergies, Complete Past Medical History, Past Surgical History, Family History, and Social History were reviewed in [employer] record.  ROS  The following are not active complaints unless bolded sore throat, dysphagia, dental problems, itching, sneezing,  nasal congestion or excess/ purulent secretions, ear ache,   fever, chills, sweats, unintended wt loss, pleuritic or exertional cp, hemoptysis,  orthopnea pnd or leg swelling, presyncope, palpitations, heartburn, abdominal pain, anorexia, nausea, vomiting, diarrhea  or change in bowel or urinary habits, change in stools or urine, dysuria,hematuria,  rash, arthralgias, visual complaints, headache, numbness weakness or ataxia or problems with walking or coordination,  change in mood/affect or memory.          Objective:   Physical Exam  amb obese wm nad  07/22/2013         237 >245 08/26/2013      Head: Normocephalic and atraumatic.  Right Ear: External ear normal.  Left Ear: External ear normal.   Mouth/Throat: Oropharynx is clear and moist. No oropharyngeal exudate.  Eyes: Conjunctivae and EOM are normal. Pupils are equal, round, and reactive to light. Right eye exhibits no discharge. Left eye exhibits no discharge. No scleral icterus.  Neck: Normal range of motion. Neck supple. No JVD present. No tracheal deviation present. No thyromegaly present.  Cardiovascular: Normal rate, regular rhythm and intact distal pulses.  Exam reveals no gallop and no friction rub.   No murmur heard. Pulmonary/Chest :  Min mid bilateral exp  Rhonchi  Abdominal: Soft. Bowel sounds are normal. He exhibits no distension and no mass. There is no tenderness. There is no rebound and no guarding.  Musculoskeletal: Normal range of motion. He exhibits no edema and no tenderness.  Lymphadenopathy:    He has no cervical adenopathy.  Neurological: He is alert and oriented to person, place, and time. He has normal reflexes. No cranial nerve deficit. Coordination normal.  Skin: Skin is warm and dry. No rash noted. He is not diaphoretic. No erythema. No pallor.  Psychiatric: He has a normal mood and affect. His behavior is normal. Judgment and thought content normal.     CXR  03/04/2013 : Streaky left basilar atelectasis, scarring or infiltrate.    CT sinus 03/05/2013 Mild deviation of the nasal septum to the right. No evidence of  sinusitis.      Assessment & Plan:

## 2013-09-09 NOTE — Patient Instructions (Addendum)
singulair 10 mg one daily in pm until return Prednsione 10 mg 2 daily until better, then one daily  Omnicef is 300 mg twice daily x 10 days   Please schedule a follow up office visit in 3 weeks, sooner if needed with all medications in 2 bags   New symbicort sample given and says has just opened last sample at home and never filled rx for symbicort > will check with drugstore to confirm

## 2013-09-10 ENCOUNTER — Encounter: Payer: Self-pay | Admitting: Internal Medicine

## 2013-09-10 ENCOUNTER — Telehealth: Payer: Self-pay | Admitting: *Deleted

## 2013-09-10 NOTE — Telephone Encounter (Signed)
Called [facility] and spoke with Octavia Bruckner  He states that pt picked up symbicort on 07/29/13 and this was the first time  Will forward to MW to make him aware

## 2013-09-10 NOTE — Assessment & Plan Note (Signed)
-   pfts 08/18/12  FEV1    2.11 ( 80%) ratio 69  -  Spirometry 06/24/13  2.10 (65%) ratio 68 - Sinus CT 03/05/2013 > Mild deviation of the nasal septum to the right. No evidence of  Sinusitis. - Allergy profile 03/05/13 > IgE 707 mulitple resp allergens identified including dogs/cats/ragweed/ trees  -- hfa 90% 04/08/2013  - Singulair 04/08/13 > d/c  07/23/2013 >discontinued 08/26/2013 >  flared 09/09/13 so restarted  - daily pred rx 04/22/13 > 08/26/2013 taper off over 3 weeks  > flared 09/09/13 so restarted  -med calendar 05/13/2013 , 08/26/2013  - spirometry 06/24/2013  FEV 1  2.10 (65%) ratio 68  - 06/24/2013 p extensive coaching HFA effectiveness =    90%    DDX of  difficult airways managment all start with A and  include Adherence, Ace Inhibitors, Acid Reflux, Active Sinus Disease, Alpha 1 Antitripsin deficiency, Anxiety masquerading as Airways dz,  ABPA,  allergy(esp in young), Aspiration (esp in elderly), Adverse effects of DPI,  Active smokers, plus two Bs  = Bronchiectasis and Beta blocker use..and one C= CHF  Adherence is always the initial "prime suspect" and is a multilayered concern that requires a "trust but verify" approach in every patient - starting with knowing how to use medications, especially inhalers, correctly, keeping up with refills and understanding the fundamental difference between maintenance and prns vs those medications only taken for a very short course and then stopped and not refilled.  - not following med calendar - ? Not filling rx for symbicort so the counts don't add up > has just started new sample at home so given one more and see in 2 weeks and check counters  Allergy clearly playing big role here > restart singulair, refer to allergy next ov

## 2013-09-10 NOTE — Telephone Encounter (Signed)
Message copied by Rosana Berger on Thu Sep 10, 2013 12:32 PM ------      Message from: Christinia Gully B      Created: Thu Sep 10, 2013  8:02 AM       Check with his drugstore re when if he ever picked up his symbicort (he says he only uses samples and the counts don't add up) ------

## 2013-09-14 ENCOUNTER — Other Ambulatory Visit (INDEPENDENT_AMBULATORY_CARE_PROVIDER_SITE_OTHER): Payer: Medicare PPO

## 2013-09-14 DIAGNOSIS — E1165 Type 2 diabetes mellitus with hyperglycemia: Principal | ICD-10-CM

## 2013-09-14 DIAGNOSIS — IMO0001 Reserved for inherently not codable concepts without codable children: Secondary | ICD-10-CM

## 2013-09-14 LAB — MICROALBUMIN / CREATININE URINE RATIO
CREATININE, U: 95.7 mg/dL
MICROALB UR: 32.4 mg/dL — AB (ref 0.0–1.9)
Microalb Creat Ratio: 33.9 mg/g — ABNORMAL HIGH (ref 0.0–30.0)

## 2013-09-14 LAB — COMPREHENSIVE METABOLIC PANEL
ALT: 23 U/L (ref 0–53)
AST: 23 U/L (ref 0–37)
Albumin: 4.1 g/dL (ref 3.5–5.2)
Alkaline Phosphatase: 79 U/L (ref 39–117)
BILIRUBIN TOTAL: 0.5 mg/dL (ref 0.3–1.2)
BUN: 45 mg/dL — ABNORMAL HIGH (ref 6–23)
CALCIUM: 9.4 mg/dL (ref 8.4–10.5)
CHLORIDE: 105 meq/L (ref 96–112)
CO2: 22 meq/L (ref 19–32)
Creatinine, Ser: 2.5 mg/dL — ABNORMAL HIGH (ref 0.4–1.5)
GFR: 26.93 mL/min — AB (ref >60.00)
GLUCOSE: 167 mg/dL — AB (ref 70–99)
Potassium: 4.9 mEq/L (ref 3.5–5.1)
Sodium: 136 mEq/L (ref 135–145)
Total Protein: 7.1 g/dL (ref 6.0–8.3)

## 2013-09-14 LAB — LIPID PANEL
CHOLESTEROL: 156 mg/dL (ref 0–200)
HDL: 54.1 mg/dL (ref >39.00)
LDL Cholesterol: 83 mg/dL (ref 0–99)
Total CHOL/HDL Ratio: 3
Triglycerides: 95 mg/dL (ref 0.0–149.0)
VLDL: 19 mg/dL (ref 0.0–40.0)

## 2013-09-16 ENCOUNTER — Other Ambulatory Visit: Payer: Self-pay | Admitting: *Deleted

## 2013-09-16 ENCOUNTER — Ambulatory Visit (INDEPENDENT_AMBULATORY_CARE_PROVIDER_SITE_OTHER): Payer: Medicare PPO | Admitting: Endocrinology

## 2013-09-16 ENCOUNTER — Encounter: Payer: Self-pay | Admitting: Endocrinology

## 2013-09-16 VITALS — BP 132/78 | HR 100 | Temp 98.1°F | Resp 16 | Ht 70.5 in | Wt 217.6 lb

## 2013-09-16 DIAGNOSIS — IMO0001 Reserved for inherently not codable concepts without codable children: Secondary | ICD-10-CM

## 2013-09-16 DIAGNOSIS — N184 Chronic kidney disease, stage 4 (severe): Secondary | ICD-10-CM

## 2013-09-16 DIAGNOSIS — E1165 Type 2 diabetes mellitus with hyperglycemia: Principal | ICD-10-CM

## 2013-09-16 DIAGNOSIS — I10 Essential (primary) hypertension: Secondary | ICD-10-CM

## 2013-09-16 MED ORDER — ZOLPIDEM TARTRATE 5 MG PO TABS: 5.0000 mg | ORAL_TABLET | Freq: Every evening | ORAL | Status: DC | PRN

## 2013-09-16 NOTE — Patient Instructions (Addendum)
Humalog 25-30  units before meals,  Lantus  24 units --20

## 2013-09-16 NOTE — Progress Notes (Signed)
Patient ID: Clayton [name], male   DOB: [birth year], 80 y.o.   MRN: [id number]   Reason for Appointment: Followup of various issues  History of Present Illness   Type 2 DIABETES MELITUS   He has had long-standing diabetes and has been on basal bolus insulin for the last few years Since 2014 he has required larger doses of insulin especially with having to take periodic courses of steroids for his pulmonary problems Also his Actos was stopped because of tendency to edema and metformin stopped because of renal dysfunction  He has now been on 20 mg of prednisone for his respiratory symptoms from pulmonologist since 3/25 in his blood sugars have gone up significantly. Previously was having fairly good readings with occasional hypoglycemia also midday and afternoon Fasting blood sugars are also significantly high and blood sugars are now mostly in the 160-220 range Has not checked any readings after supper On his own he has increased his Humalog 4 units but not his Lantus and glucose was still high at 164 today a.m. Has not been able to do any consistent exercise because of his cough but has been periodically walking   Oral hypoglycemic drugs: None      Side effects from medications: None Insulin regimen: Humalog 25  units before meals, Lantus recently 20 units a.m. --24 units p.m.       Monitors blood glucose:  3.1 times a day   Glucometer: FreeStyle         Glucose  readings:   PREMEAL Breakfast Lunch Dinner Bedtime Overall  Glucose range:  83-218   50-237   49-323   216   49-323   Mean/median:  139   133   160    143     Hypoglycemia:  lowest reading was 49 at 5 PM, has about 2 or 3 readings low at around 11 AM and 5 PM Meals: 3 meals per day.  breakfast: oatmeal, grits, some cheese        Physical activity: exercise: walking 1 mile             Complications are: Nephropathy. Microalbumin level now nearly normal  Problem 2:  RENAL insufficiency: He has had mild chronic increase in  creatinine, was 1.7 in 2014. This has been related to nephropathy and glomerulosclerosis However his level was increased to 2.6 in 06/29/13 for no apparent reason. Since then has been about the same Since his blood pressure was not unusually low and he was not on diuretics or any OTC nonsteroidal anti-inflammatory drugs no change was made and he was referred to a nephrologist but the appointment has not been made yet. Renal ultrasound did not show any abnormality   LABS:  Lab Results  Component Value Date   HGBA1C 7.4* 07/15/2013   HGBA1C 7.9* 04/01/2013   HGBA1C 7.5* 12/29/2012   Lab Results  Component Value Date   MICROALBUR 32.4* 09/14/2013   LDLCALC 83 09/14/2013   CREATININE 2.5* 09/14/2013    Appointment on 09/14/2013  Component Date Value Ref Range Status  . Sodium 09/14/2013 136  135 - 145 mEq/L Final  . Potassium 09/14/2013 4.9  3.5 - 5.1 mEq/L Final  . Chloride 09/14/2013 105  96 - 112 mEq/L Final  . CO2 09/14/2013 22  19 - 32 mEq/L Final  . Glucose, Bld 09/14/2013 167* 70 - 99 mg/dL Final  . BUN 09/14/2013 45* 6 - 23 mg/dL Final  . Creatinine, Ser 09/14/2013 2.5* 0.4 - 1.5 mg/dL Final  .  Total Bilirubin 09/14/2013 0.5  0.3 - 1.2 mg/dL Final  . Alkaline Phosphatase 09/14/2013 79  39 - 117 U/L Final  . AST 09/14/2013 23  0 - 37 U/L Final  . ALT 09/14/2013 23  0 - 53 U/L Final  . Total Protein 09/14/2013 7.1  6.0 - 8.3 g/dL Final  . Albumin 09/14/2013 4.1  3.5 - 5.2 g/dL Final  . Calcium 09/14/2013 9.4  8.4 - 10.5 mg/dL Final  . GFR 09/14/2013 26.93* >60.00 mL/min Final  . Cholesterol 09/14/2013 156  0 - 200 mg/dL Final   ATP III Classification       Desirable:  < 200 mg/dL               Borderline High:  200 - 239 mg/dL          High:  > = 240 mg/dL  . Triglycerides 09/14/2013 95.0  0.0 - 149.0 mg/dL Final   Normal:  <150 mg/dLBorderline High:  150 - 199 mg/dL  . HDL 09/14/2013 54.10  >39.00 mg/dL Final  . VLDL 09/14/2013 19.0  0.0 - 40.0 mg/dL Final  . LDL Cholesterol  09/14/2013 83  0 - 99 mg/dL Final  . Total CHOL/HDL Ratio 09/14/2013 3   Final                  Men          Women1/2 Average Risk     3.4          3.3Average Risk          5.0          4.42X Average Risk          9.6          7.13X Average Risk          15.0          11.0                      . Microalb, Ur 09/14/2013 32.4* 0.0 - 1.9 mg/dL Final  . Creatinine,U 09/14/2013 95.7   Final  . Microalb Creat Ratio 09/14/2013 33.9* 0.0 - 30.0 mg/g Final      Medication List       This list is accurate as of: 09/16/13  8:17 AM.  Always use your most recent med list.               budesonide-formoterol 160-4.5 MCG/ACT inhaler  Commonly known as:  SYMBICORT  Inhale 2 puffs into the lungs 2 (two) times daily.     cefdinir 300 MG capsule  Commonly known as:  OMNICEF  Take 1 capsule (300 mg total) by mouth 2 (two) times daily.     chlorpheniramine 4 MG tablet  Commonly known as:  CHLOR-TRIMETON  2 tablets by mouth at bedtime     dextromethorphan-guaiFENesin 30-600 MG per 12 hr tablet  Commonly known as:  MUCINEX DM  1-2 every 12 hours as needed for cough (with flutter valve)     docusate sodium 100 MG capsule  Commonly known as:  COLACE  2 capsules by mouth twice daily     estazolam 2 MG tablet  Commonly known as:  PROSOM  Take 1 tablet (2 mg total) by mouth at bedtime.     famotidine 20 MG tablet  Commonly known as:  PEPCID  Take 1 tablet by mouth at bedtime.     Ferrous Sulfate 27 MG Tabs  Take  1 tablet by mouth daily.     finasteride 5 MG tablet  Commonly known as:  PROSCAR  TAKE 1 TABLET (5 MG TOTAL) BY MOUTH DAILY.     FLUTTER Devi  Use as directed.     freestyle lancets  Use to check blood sugars 4 times per day     furosemide 20 MG tablet  Commonly known as:  LASIX  Take 20 mg by mouth daily as needed.     glucose blood test strip  Commonly known as:  FREESTYLE LITE  Use to check blood sugars 4 times per day dx code 250.00     insulin glargine 100 UNIT/ML  injection  Commonly known as:  LANTUS  Inject 20 units in the am and 24 units in the evening     insulin lispro 100 UNIT/ML injection  Commonly known as:  HUMALOG  25 Units. Sliding scale with each meal     montelukast 10 MG tablet  Commonly known as:  SINGULAIR  One at bedtime every night     multivitamin with minerals Tabs tablet  Take 1 tablet by mouth daily.     oxybutynin 5 MG tablet  Commonly known as:  DITROPAN  Take 5 mg by mouth 3 (three) times daily.     pantoprazole 40 MG tablet  Commonly known as:  PROTONIX  TAKE 1 TABLET (40 MG TOTAL) BY MOUTH DAILY. TAKE 30-60 MIN BEFORE FIRST MEAL OF THE DAY     predniSONE 10 MG tablet  Commonly known as:  DELTASONE  Take  2 each am until better then 1 daily     simvastatin 20 MG tablet  Commonly known as:  ZOCOR  TAKE 1 TABLET (20 MG TOTAL) BY MOUTH EVERY EVENING.     tolterodine 4 MG 24 hr capsule  Commonly known as:  DETROL LA  Take 4 mg by mouth daily.     Vitamin D3 2000 UNITS Tabs  Take 1 capsule by mouth every morning.        Allergies:  Allergies  Allergen Reactions  . Penicillins     Past Medical History  Diagnosis Date  . Diabetes mellitus   . Hypertension   . Anemia   . Chronic renal insufficiency   . Osteoporosis   . Neuromuscular disorder   . DDD (degenerative disc disease), lumbar     Past Surgical History  Procedure Laterality Date  . Vasectomy    . Eye surgery    . Video bronchoscopy Bilateral 08/25/2012    Procedure: VIDEO BRONCHOSCOPY WITHOUT FLUORO;  Surgeon: Brand Males, MD;  Location: [facility];  Service: Cardiopulmonary;  Laterality: Bilateral;    Family History  Problem Relation Age of Onset  . Diabetes Mellitus II    . Hypertension      Social History:  reports that he quit smoking about 40 years ago. His smoking use included Cigarettes. He has a 99 pack-year smoking history. He quit smokeless tobacco use about 38 years ago. He reports that he does not drink alcohol  or use illicit drugs.  Review of Systems -   Insomnia: He is not able to sleep through the night with current regimen of ProSom overnight and he Wants Ambien instead  He still has persistent cough and rattling in chest despite taking prednisone 10 mg was recently given 20 mg along with steroid inhaler  History of hypertension: Currently not requiring any medications; blood pressure is fairly good   Edema: Not present at this time,  also not taking any Lasix  He has had lower urinary tract problems and BPH, currently taking Proscar and Detrol but his insurance wants him to take oxybutynin. Prescriptions are being done by his urologist  HYPERLIPIDEMIA:         The lipid abnormality consists of  minimally elevated LDL, taking simvastatin for cardiovascular protection. Has good HDL levels and good overall control  Lab Results  Component Value Date   CHOL 156 09/14/2013   HDL 54.10 09/14/2013   LDLCALC 83 09/14/2013   TRIG 95.0 09/14/2013   CHOLHDL 3 09/14/2013       Examination:   BP 132/78  Pulse 100  Temp(Src) 98.1 F (36.7 C)  Resp 16  Ht 5' 10.5" (1.791 m)  Wt 217 lb 9.6 oz (98.703 kg)  BMI 30.77 kg/m2  SpO2 97%  Body mass index is 30.77 kg/(m^2).   No edema of ankles  Assesment/PLAN:   1. Diabetes type 2, uncontrolled  The patient's diabetes control appears to be worsening with increasing his prednisone. Although he is increasing his Humalog to improve his control he is having high readings in the morning recently indicating insufficient basal insulin Overall has been fairly good with diet and trying to walk as much as possible for exercise Discussed needing to increase all his insulin doses and also Lantus by 4 units twice a day until blood sugar is back to normal May need to increase Humalog further for hyperglycemia Also discussed needing to check more readings after supper to help adjust his supper time coverage He can go back to his original doses when he is back  down to 10 mg prednisone  2.  Renal insufficiency: His creatinine is about the same over the last 2 months.  etiology of his renal function is unclear as ultrasound was not showing any obstructive uropathy Also has not had any intercurrent medical issues causing renal dysfunction Still pending nephrology consultation and will try to facilitate this again   no diuretics needed at this time since he has no edema  3. Lipid management: Adequately controlled with simvastatin  4. Chronic insomnia: He can switch to Ambien. Insomnia probably worse because of taking prednisone    Sanjeev Main  09/16/2013, 8:17 AM

## 2013-09-23 ENCOUNTER — Other Ambulatory Visit: Payer: Self-pay | Admitting: *Deleted

## 2013-09-23 ENCOUNTER — Ambulatory Visit: Payer: Medicare PPO | Admitting: Internal Medicine

## 2013-09-23 ENCOUNTER — Telehealth: Payer: Self-pay | Admitting: Endocrinology

## 2013-09-23 NOTE — Telephone Encounter (Signed)
I spoke with the pharmacist who said the mix up was on their end, she was going to call the patient to straighten it out.

## 2013-09-23 NOTE — Telephone Encounter (Signed)
Pt states when he received histest strips were only 50 when he has been getting 100 a month    Can you please advise pt   Thank you :)

## 2013-09-23 NOTE — Telephone Encounter (Signed)
His script for lancet and test strips sent to the pharmacy is not correct he was supposed to receive 100 of each the order was only for 50 test strips

## 2013-09-30 ENCOUNTER — Encounter: Payer: Self-pay | Admitting: Internal Medicine

## 2013-09-30 ENCOUNTER — Ambulatory Visit (INDEPENDENT_AMBULATORY_CARE_PROVIDER_SITE_OTHER): Payer: Medicare PPO | Admitting: Internal Medicine

## 2013-09-30 VITALS — BP 142/74 | HR 96 | Temp 97.5°F | Ht 70.5 in | Wt 235.0 lb

## 2013-09-30 DIAGNOSIS — Z23 Encounter for immunization: Secondary | ICD-10-CM

## 2013-09-30 DIAGNOSIS — J45991 Cough variant asthma: Secondary | ICD-10-CM

## 2013-09-30 NOTE — Patient Instructions (Addendum)
Prednisone dose is 20 mg if worse, 10 mg if better  See calendar for specific medication instructions and bring it back for each and every office visit for every healthcare provider you see.  Without it,  you may not receive the best quality medical care that we feel you deserve.  You will note that the calendar groups together  your maintenance  medications that are timed at particular times of the day.  Think of this as your checklist for what your doctor has instructed you to do until your next evaluation to see what benefit  there is  to staying on a consistent group of medications intended to keep you well.  The other group at the bottom is entirely up to you to use as you see fit  for specific symptoms that may arise between visits that require you to treat them on an as needed basis.  Think of this as your action plan or "what if" list.   Separating the top medications from the bottom group is fundamental to providing you adequate care going forward.    Prevnar is the last pneumonia dose you need  Please schedule a follow up office visit in 6 weeks, call sooner if needed to see Tammy with all meds/ pillboxes in hand  Late add: needs allergy eval next by Dr Annamaria Boots.

## 2013-09-30 NOTE — Progress Notes (Signed)
Subjective:    Patient ID: Clayton [name], male    DOB: 11-30-1927    MRN: [id number]  HPI PCP Lujean Amel, MD HPI #Followup  - Influenza related acute ventilatory respiratory failure on the ventilator and post influenza MRSA pneumonia - January 2014  - False positive Non-small cell lung cancer detected on an endobronchial mucous plug during acute ICU stay January 2014  - deemed false positive based on - PET scan March 2014 and normal bronch March 2014 and 2nd opinion Jan 2014 path by DR Dian Situ  - Chronic cough that predates above  - PFT 09/02/12: His pulmonary function test today is essentially normal except for reduced forced vital capacity, 9% bronchodilator response with FEV1 and in reduced small airway function. Specifically, FEV1 is 2.1 L/80%. FVC 3.1 L/73%. Bronchodilator response FEV1 is 9%. Small airways is 60%. Total lung capacity is 83%. DLCO is 85%.  - March 2014: Normal bronchoscopy   OV 10/13/2012  Follow for chronic cough.  -At last visit on 08/29/2012 started him on a 2 part, nasal steroid, over-the-counter proton pump inhibitor and asked him to continue on DuoNeb and Qvar. In addition started gabapentin and referred him to speech therapy. With these interventions cough is on 50% better and RSI cough score is reduced from 16 to 9. However, he and his daughter Clayton [name] are not happy with the level of improvement. Diffusing quality of life is impaired because he's unable to attend occasional social functions and they feel cough is a social embarrassment. They admit to significant gagging and clearing of the throat. Of note, he did and speech therapy for classes but since has been discharged from followup  - In terms of the supposed to lung cancer got an e-mail from Dr. Dian Situ who feels that his pathology but did false positive atypia and that she is seen cases like this and patient to be acutely ill.  REC #Sleep apnea  -Room air cpap to continue  #Lung cancer - No  evidence of lung cancer as of April 2014 - Pathologist in [place] says likely reaction to acute infection - Followup CT chest 1 year   #Chronic cough Cough is from sinus drainage, possible acid reflux, possible asthma,  All of this is working together to cause cyclical cough/LPR cough otherwise for irritable larynx  #Sinus drainage  - continue netti pot daily  or 3% hypertonic saline nasal spray -  Continue nasal steroid generic fluticasone inhaler 2 squirts each nostril daily as advised  - start asteprol nasal inhaler day time 2 squirts - ENT consult depending on course      #Acute bronchitis  - Take doxycycline 100mg  po twice daily x 5 days; take after meals and avoid sunlight  -Take prednisone 40 mg daily x 2 days, then 20mg  daily x 2 days, then 10mg  daily x 2 days, then 5mg  daily x 2 days and stop  - give sputum for gram stain and culture; will call with results  #Chronic cough  - so far no better and gabapentin not helping  - slow stop gabapentin over 3 weeks  - continue nasal inhalers and oral inhalers for lung  - see ENT physician for chronic cough/.sinus and hearing issues as well  #Followup  2 months with cough score   OV 02/11/2013 qvar not helping so stopped.  Nebs help but Cough overall a bother. RSI score 19 and shows lack of improvement. He is frustrated by the lack of improvement in his cough. Neurontin not helping.  Nasal inhalers helping only partially He shows me each visit take brown sputum. All features upon chronic bronchitis but he really wants relief from his cough. He is open to a second opinion rec  #Chronic cough  - so far no better despite all measures I have tried  - stop QVAR due to lack of relief -continue nebs and other nasal treatment  #Lung nodule  - next scan needed Jan 2015  #Followup Dr Melvyn Novas for 2nd opinion on cough Return to see me JAn 2015 with CT chest   03/04/2013 Clayton [name]/ Consultation/ second opinion:   re persistent cough ever since  the flu requiring ET Chief Complaint  Patient presents with  . Pulmonary Consult    Referred per MR for second opinion on cough. Pt c/o cough since Jan 2014. Cough is prod with moderate pale yellow sputum.  He states that there is no specific thing that triggers the cough. He c/o increased SOB and leg cramps for the past wk. Using neb txs approx 3 times per day.   cough daily ever since tube pulled with waxes and wanes some s pattern x better p neb treatment always productive up to 2 tbsp per day thick yellow mucus >>Levaquin 750 mg one daily x 5 days Prednisone 10 mg take  4 each am x 2 days,   2 each am x 2 days,  1 each am x 2 days and stop  For cough mucinex dm 1200 mg every 12 hours use your flutter ,dulera 100 Take 2 puffs first thing in am and then another 2 puffs about 12 hours later -work on technique Pantoprazole (protonix) 40 mg   Take 30-60 min before first meal of the day and Pepcid 20 mg one bedtime until return to office -    03/18/2013 Follow up and med review  Patient returns for a followup and medication review. We reviewed all his medications and organized them into a medication calendar with patient education Seen 2 weeks ago with Dr. Melvyn Novas for second opinion related to persistent cough. He was given Levaquin 750 mg daily for 5 days a prednisone taper, and started on Dulera twice daily. He was also treated with Protonix, and Pepcid Returns today feeling that his breathing is doing well today; still taking 20mg  prednisone daily. Workup with CXR and CT sinus unrevealing.  He did not get mucinex DM as recommended and is using duoneb as scheduled med.  Overall  Cough is much better with significant drop in coughing but not totally cough free.  No hemoptyisis, chest pain , orthopnea, edema.  Did not taper prednisone as directed.  Last ov labs showed IGE elevated 707 , +rast , bnp nml. Cbc w/ no eosinophils elevation.  rec Decrease prednisone 10mg  daily for 1 week then 1/2 daily  for 1 week and stop > found did better on pred 20 (75%) Add Mucinex DM Twice daily  As needed  Cough.  Add flutter valve As needed  For cough Brush/rinse and gargle after inhaler use.  Follow med calendar closely and bring to each visit.  follow up Dr. Melvyn Novas  In 3 weeks and As needed     04/08/2013 f/u ov/Clayton [name] re: chronic cough > sob  since Jan 2014 extubation  Chief Complaint  Patient presents with  . Follow-up    Pt states that his breathing seems to be doing some better. His cough is more prod with pale yellow sputum- esp on days when only takes 10 m pred daily.  Not clear he's following med calendar "I have my own system" rec Increase dulera to 200 Take 2 puffs first thing in am and then another 2 puffs about 12 hours later.  Add singulair 10 mg one each bedtime Use mucinex dm and flutter valve as per calendar   04/22/2013 f/u ov/Clayton [name] re: chronic cough with marked elevation of IgE c/w cough variant asthma Chief Complaint  Patient presents with  . Follow-up    Breathing is slightly better. Productive cough pale yellow phlem. No wheezing. No chest tx   Organization of meds in bags but not correlating with calendar - not sure he's following any of the instructions given.   Typically does respond somewhat to short courses of prednisone. >>pred 20mg    05/13/2013 Follow up and med review  Patient returns for a followup and medication review. We reviewed all his medications and organized them into a medication calendar with patient education Appears he is taking her meds correctly.  Last ov was recommended to increase prednisone 20mg  daily. Did not see much improvement in cough.  Cough is productive with clear to light yellow mucus on/off . Using mucinex DM with some help.  He remains acitve.  Rides bike often, -now rides. recumbent trike.  Avid cyclist-has ridden in all 50 states.  rec Add Chlorpheniramine 4mg  2 tabs At bedtime   Change to Mucinex 1-2 Twice daily  As needed   Cough/congestion  Add Delsym 2 tsp Twice daily  As needed  Cough  Decrease Prednisone 10mg  daily  Follow med calendar closely and bring to each visit.  Goal is to not cough, use sugarless candy and water to help soothe throat  Avoid coughing and throat clearing  NO MINTS.   06/24/2013 f/u ov/Clayton [name] re: cough/ prednisone 10 mg daily  Chief Complaint  Patient presents with  . Follow-up    Pt states cough is some improved since his last visit. No new co's today.   able to bike up to 11 miles per day vs 17 prior to ET but not biking as much due to weather.   No resp medications at all prior to his flu in Jan 2014  Sleeps well and no flare in am of cough until stirs around then gets up a little slt yellow mucus. rec Reduce prednisone to 10 mg one half daily Increase chlortrimeton to 4 mg 2 at bedtime as per calendar For cough > mucinex dm up to 1200 mg every 12 hours and use flutter as much as possible  Please schedule a follow up office visit in 4 weeks, sooner if needed    07/22/2013 f/u ov/Clayton [name] re: chronic cough / steroid dep ? Cough variant asthma   Chief Complaint  Patient presents with  . Follow-up    Pt states cough is about 50% better. Still prod with yellow sputum.   >>Omnicef x 10 d  Ok to stop duoneb and singulair  as not helping omnicef 300 mg twice daily x 10 days to see if changes the color of mucus if still purulent sputum then need fob next step - if no purulent sputum but still bad cough do allergy eval next   08/26/2013 Follow up and Med Review  Returns for follow up and med review  We reviewed all his meds and organized them into a med calendar.  Singulair stopped last ov, forgot to stop. Discussed plan today  Remains on prednisone 10mg  daily .  Last ov rx Omnicef , cough is better, minimal mucus.  Feels he is improved overall.  rec Decrease Prednisone 10mg  1/2 daily for 2 weeks, then 1/2 every other day for 1 week and stop .  Watch blood sugars as they may drop off  steroids .  Stop singulair .  Follow med calendar closely and bring to each visit.     09/09/2013 f/u ov/Clayton [name] re: recurrent cough/ did not bring med calendar/ still on symbicort 160 2bid but ? Just samples? Chief Complaint  Patient presents with  . Acute Visit    Pt c/o increased cough and "rattling" in chest x 1 wk. Cough is prod with minimal to moderate pale yellow sputum.     Recurred w/in a week of stopping pred/ singulair rec singulair 10 mg one daily in pm until return Prednsione 10 mg 2 daily until better, then one daily  Omnicef is 300 mg twice daily x 10 days    09/30/2013 f/u ov/Clayton [name] re: chronic cough  Chief Complaint  Patient presents with  . Follow-up    Pt states that his cough has improved some. He still has some cough- prod with minimal yellow to green sputum.  "Rattle" in chest in some better.    symbicort count is 38/60  17 miles on bike x 2 days - Not limited by breathing from desired activities     No obvious day to day or daytime variabilty or assoc sob or cp or chest tightness, subjective wheeze overt sinus or hb symptoms. No unusual exp hx or h/o childhood pna/ asthma or knowledge of premature birth.  Sleeping ok without nocturnal  or early am exacerbation  of respiratory  c/o's or need for noct saba. Also denies any obvious fluctuation of symptoms with weather or environmental changes or other aggravating or alleviating factors except as outlined above   Current Medications, Allergies, Complete Past Medical History, Past Surgical History, Family History, and Social History were reviewed in [employer] record.  ROS  The following are not active complaints unless bolded sore throat, dysphagia, dental problems, itching, sneezing,  nasal congestion or excess/ purulent secretions, ear ache,   fever, chills, sweats, unintended wt loss, pleuritic or exertional cp, hemoptysis,  orthopnea pnd or leg swelling, presyncope, palpitations, heartburn,  abdominal pain, anorexia, nausea, vomiting, diarrhea  or change in bowel or urinary habits, change in stools or urine, dysuria,hematuria,  rash, arthralgias, visual complaints, headache, numbness weakness or ataxia or problems with walking or coordination,  change in mood/affect or memory.          Objective:   Physical Exam  amb obese wm nad   Wt Readings from Last 3 Encounters:  09/30/13 235 lb (106.595 kg)  09/16/13 217 lb 9.6 oz (98.703 kg)  09/09/13 238 lb (107.956 kg)          Head: Normocephalic and atraumatic.  Right Ear: External ear normal.  Left Ear: External ear normal.  Mouth/Throat: Oropharynx is clear and moist. No oropharyngeal exudate.  Eyes: Conjunctivae and EOM are normal. Pupils are equal, round, and reactive to light. Right eye exhibits no discharge. Left eye exhibits no discharge. No scleral icterus.  Neck: Normal range of motion. Neck supple. No JVD present. No tracheal deviation present. No thyromegaly present.  Cardiovascular: Normal rate, regular rhythm and intact distal pulses.  Exam reveals no gallop and no friction rub.   No murmur heard. Pulmonary/Chest :  Min mid bilateral exp  Rhonchi  Abdominal: Soft. Bowel sounds are normal. He exhibits no distension and no mass. There  is no tenderness. There is no rebound and no guarding.  Musculoskeletal: Normal range of motion. He exhibits no edema and no tenderness.  Lymphadenopathy:    He has no cervical adenopathy.  Neurological: He is alert and oriented to person, place, and time. He has normal reflexes. No cranial nerve deficit. Coordination normal.  Skin: Skin is warm and dry. No rash noted. He is not diaphoretic. No erythema. No pallor.    CXR  03/04/2013 : Streaky left basilar atelectasis, scarring or infiltrate.    CT sinus 03/05/2013 Mild deviation of the nasal septum to the right. No evidence of sinusitis.      Assessment & Plan:

## 2013-10-04 ENCOUNTER — Encounter: Payer: Self-pay | Admitting: Internal Medicine

## 2013-10-04 NOTE — Assessment & Plan Note (Signed)
-   pfts 08/18/12  FEV1    2.11 ( 80%) ratio 69  -  Spirometry 06/24/13  2.10 (65%) ratio 68 - Sinus CT 03/05/2013 > Mild deviation of the nasal septum to the right. No evidence of  Sinusitis. - Allergy profile 03/05/13 > IgE 707 mulitple resp allergens identified including dogs/cats/ragweed/ trees  -- hfa 90% 04/08/2013  - Singulair 04/08/13 > d/c  07/23/2013 >discontinued 08/26/2013 >  flared 09/09/13 so restarted  - daily pred rx 04/22/13 > 08/26/2013 taper off over 3 weeks  > flared 09/09/13 so restarted  -med calendar 05/13/2013 , 08/26/2013  - spirometry 06/24/2013  FEV 1  2.10 (65%) ratio 68  - 07/29/13 filled rx for symbicort for first and only time per pharmacy - 06/24/2013 p extensive coaching HFA effectiveness =    90%   DDX of  difficult airways managment all start with A and  include Adherence, Ace Inhibitors, Acid Reflux, Active Sinus Disease, Alpha 1 Antitripsin deficiency, Anxiety masquerading as Airways dz,  ABPA,  allergy(esp in young), Aspiration (esp in elderly), Adverse effects of DPI,  Active smokers, plus two Bs  = Bronchiectasis and Beta blocker use..and one C= CHF  Adherence is always the initial "prime suspect" and is a multilayered concern that requires a "trust but verify" approach in every patient - starting with knowing how to use medications, especially inhalers, correctly, keeping up with refills and understanding the fundamental difference between maintenance and prns vs those medications only taken for a very short course and then stopped and not refilled.  - not convinced we have full med reconciliation/compliance as the counts for symbicort are way off> reinforced -f/u in 6 weeks with all meds in hand  Allergy> continue singulair and prednisone pending allergy eval. The goal with a chronic steroid dependent illness is always arriving at the lowest effective dose that controls the disease/symptoms and not accepting a set "formula" which is based on statistics or guidelines that don't  always take into account patient  variability or the natural hx of the dz in every individual patient, which may well vary over time.  For now therefore I recommend the patient maintain  20 ceiling and 10 mg floor.   ? Acid (or non-acid) GERD > always difficult to exclude as up to 75% of pts in some series report no assoc GI/ Heartburn symptoms> rec continue max (24h)  acid suppression and diet restrictions/ reviewed and instructions given in writing.

## 2013-10-06 ENCOUNTER — Other Ambulatory Visit: Payer: Self-pay | Admitting: *Deleted

## 2013-10-06 MED ORDER — ACCU-CHEK AVIVA PLUS W/DEVICE KIT: PACK | Status: DC

## 2013-10-06 MED ORDER — GLUCOSE BLOOD VI STRP: ORAL_STRIP | Status: DC

## 2013-10-09 ENCOUNTER — Other Ambulatory Visit: Payer: Self-pay | Admitting: *Deleted

## 2013-10-09 MED ORDER — FINASTERIDE 5 MG PO TABS: 5.0000 mg | ORAL_TABLET | Freq: Every day | ORAL | Status: DC

## 2013-10-09 MED ORDER — ACCU-CHEK AVIVA VI SOLN: Status: DC

## 2013-10-09 MED ORDER — SIMVASTATIN 20 MG PO TABS: 20.0000 mg | ORAL_TABLET | Freq: Every day | ORAL | Status: DC

## 2013-10-09 MED ORDER — ACCU-CHEK SOFTCLIX LANCETS MISC: Status: DC

## 2013-10-09 MED ORDER — GLUCOSE BLOOD VI STRP: ORAL_STRIP | Status: DC

## 2013-10-12 ENCOUNTER — Telehealth: Payer: Self-pay | Admitting: Internal Medicine

## 2013-10-12 MED ORDER — BUDESONIDE-FORMOTEROL FUMARATE 160-4.5 MCG/ACT IN AERO: 2.0000 | INHALATION_SPRAY | Freq: Two times a day (BID) | RESPIRATORY_TRACT | Status: DC

## 2013-10-12 NOTE — Telephone Encounter (Signed)
Called spoke with patient who reported that his most recent refill on his Symbicort cost him $150.  The previous had only cost $45.  Pt asking if he can stop this medication and ran out over the weekend.  Explained to patient that perhaps there was a change in his insurance formulary and asked if he has a copy of his 3 insurance formulary - he does not.  Offered to place sample of Symbicort up front for pt to pick up until we can get this straightened out - done and documented per protocol.  Advised pt will call his pharmacy (verified) and ask if they can discern any reason why the cost has gone up so dramatically.  Advised pt will call him back.  Called [facility], spoke with [place].  Per Estill Bamberg pt's Symbicort cost in Feb 2015 was $45.  His last refill in March was $124.32.  No mention of prior authorization or Tier change.  Estill Bamberg did provide the insurance number to call >> [phone].  Called the above number and spoke with [employer] rep Gerald Stabs.  Per Gerald Stabs, pt is now in the Coverage Gap or "donut hole" as of April 14th.  His last attemp at refilling his Symbicort was 4/27.  Per Gerald Stabs, changing to another inhaler will not yield in a different result/cost.  He did mention that pt can apply for financial assistance thru the [employer] at [phone].  Called spoke with patient again and explained to him my findings.  Pt stated that he would actually like to STOP taking the Symbicort as he does not feel any relief after taking this.  Advised pt to not discontinue this medication without MW's recs to do so and advised him will send to [facility] for his recommendations.  Pt is aware MW is not in the office this afternoon and is ok with a call back tomorrow.  Dr Melvyn Novas please advise, thank you.

## 2013-10-12 NOTE — Telephone Encounter (Signed)
Would not stop it but first try just 2 puffs am only until regroup as we are using it to try to keep from giving him prednisone in high doses (cheap but lots of side effects)  If decides to stop it anyway needs ov w/in 2 weeks with all meds and med calendar in hand to regroup

## 2013-10-12 NOTE — Telephone Encounter (Signed)
Spoke with the pt and notified of recs per MW  He verbalized understanding  Will take 2 puffs every am until he comes in to see TP in May and we will go from there

## 2013-11-11 ENCOUNTER — Encounter: Payer: Self-pay | Admitting: Adult Health

## 2013-11-11 ENCOUNTER — Ambulatory Visit (INDEPENDENT_AMBULATORY_CARE_PROVIDER_SITE_OTHER): Payer: Medicare PPO | Admitting: Adult Health

## 2013-11-11 VITALS — BP 116/74 | HR 95 | Temp 97.7°F | Ht 70.5 in | Wt 237.2 lb

## 2013-11-11 DIAGNOSIS — J45991 Cough variant asthma: Secondary | ICD-10-CM

## 2013-11-11 NOTE — Patient Instructions (Signed)
Follow med calendar closely and bring to each visit.  May use extra Chlortrimeton in am if needed for drainage , throat clearing.  We are referring you to Dr. Annamaria Boots Allergist in our office  Follow up Dr. Melvyn Novas  In  2 months and As needed

## 2013-11-11 NOTE — Progress Notes (Signed)
Subjective:    Patient ID: Clayton [name], male    DOB: 1927-07-10    MRN: [id number]  HPI PCP Lujean Amel, MD HPI #Followup  - Influenza related acute ventilatory respiratory failure on the ventilator and post influenza MRSA pneumonia - January 2014  - False positive Non-small cell lung cancer detected on an endobronchial mucous plug during acute ICU stay January 2014  - deemed false positive based on - PET scan March 2014 and normal bronch March 2014 and 2nd opinion Jan 2014 path by DR Dian Situ  - Chronic cough that predates above  - PFT 09/02/12: His pulmonary function test today is essentially normal except for reduced forced vital capacity, 9% bronchodilator response with FEV1 and in reduced small airway function. Specifically, FEV1 is 2.1 L/80%. FVC 3.1 L/73%. Bronchodilator response FEV1 is 9%. Small airways is 60%. Total lung capacity is 83%. DLCO is 85%.  - March 2014: Normal bronchoscopy   OV 10/13/2012  Follow for chronic cough.  -At last visit on 08/29/2012 started him on a 2 part, nasal steroid, over-the-counter proton pump inhibitor and asked him to continue on DuoNeb and Qvar. In addition started gabapentin and referred him to speech therapy. With these interventions cough is on 50% better and RSI cough score is reduced from 16 to 9. However, he and his daughter Clayton [name] are not happy with the level of improvement. Diffusing quality of life is impaired because he's unable to attend occasional social functions and they feel cough is a social embarrassment. They admit to significant gagging and clearing of the throat. Of note, he did and speech therapy for classes but since has been discharged from followup  - In terms of the supposed to lung cancer got an e-mail from Dr. Dian Situ who feels that his pathology but did false positive atypia and that she is seen cases like this and patient to be acutely ill.  REC #Sleep apnea  -Room air cpap to continue  #Lung cancer - No  evidence of lung cancer as of April 2014 - Pathologist in [place] says likely reaction to acute infection - Followup CT chest 1 year   #Chronic cough Cough is from sinus drainage, possible acid reflux, possible asthma,  All of this is working together to cause cyclical cough/LPR cough otherwise for irritable larynx  #Sinus drainage  - continue netti pot daily  or 3% hypertonic saline nasal spray -  Continue nasal steroid generic fluticasone inhaler 2 squirts each nostril daily as advised  - start asteprol nasal inhaler day time 2 squirts - ENT consult depending on course      #Acute bronchitis  - Take doxycycline 100mg  po twice daily x 5 days; take after meals and avoid sunlight  -Take prednisone 40 mg daily x 2 days, then 20mg  daily x 2 days, then 10mg  daily x 2 days, then 5mg  daily x 2 days and stop  - give sputum for gram stain and culture; will call with results  #Chronic cough  - so far no better and gabapentin not helping  - slow stop gabapentin over 3 weeks  - continue nasal inhalers and oral inhalers for lung  - see ENT physician for chronic cough/.sinus and hearing issues as well  #Followup  2 months with cough score   OV 02/11/2013 qvar not helping so stopped.  Nebs help but Cough overall a bother. RSI score 19 and shows lack of improvement. He is frustrated by the lack of improvement in his cough. Neurontin not helping.  Nasal inhalers helping only partially He shows me each visit take brown sputum. All features upon chronic bronchitis but he really wants relief from his cough. He is open to a second opinion rec  #Chronic cough  - so far no better despite all measures I have tried  - stop QVAR due to lack of relief -continue nebs and other nasal treatment  #Lung nodule  - next scan needed Jan 2015  #Followup Dr Melvyn Novas for 2nd opinion on cough Return to see me JAn 2015 with CT chest   03/04/2013 Wert/ Consultation/ second opinion:   re persistent cough ever since  the flu requiring ET Chief Complaint  Patient presents with  . Pulmonary Consult    Referred per MR for second opinion on cough. Pt c/o cough since Jan 2014. Cough is prod with moderate pale yellow sputum.  He states that there is no specific thing that triggers the cough. He c/o increased SOB and leg cramps for the past wk. Using neb txs approx 3 times per day.   cough daily ever since tube pulled with waxes and wanes some s pattern x better p neb treatment always productive up to 2 tbsp per day thick yellow mucus >>Levaquin 750 mg one daily x 5 days Prednisone 10 mg take  4 each am x 2 days,   2 each am x 2 days,  1 each am x 2 days and stop  For cough mucinex dm 1200 mg every 12 hours use your flutter ,dulera 100 Take 2 puffs first thing in am and then another 2 puffs about 12 hours later -work on technique Pantoprazole (protonix) 40 mg   Take 30-60 min before first meal of the day and Pepcid 20 mg one bedtime until return to office -    03/18/2013 Follow up and med review  Patient returns for a followup and medication review. We reviewed all his medications and organized them into a medication calendar with patient education Seen 2 weeks ago with Dr. Melvyn Novas for second opinion related to persistent cough. He was given Levaquin 750 mg daily for 5 days a prednisone taper, and started on Dulera twice daily. He was also treated with Protonix, and Pepcid Returns today feeling that his breathing is doing well today; still taking 20mg  prednisone daily. Workup with CXR and CT sinus unrevealing.  He did not get mucinex DM as recommended and is using duoneb as scheduled med.  Overall  Cough is much better with significant drop in coughing but not totally cough free.  No hemoptyisis, chest pain , orthopnea, edema.  Did not taper prednisone as directed.  Last ov labs showed IGE elevated 707 , +rast , bnp nml. Cbc w/ no eosinophils elevation.  rec Decrease prednisone 10mg  daily for 1 week then 1/2 daily  for 1 week and stop > found did better on pred 20 (75%) Add Mucinex DM Twice daily  As needed  Cough.  Add flutter valve As needed  For cough Brush/rinse and gargle after inhaler use.  Follow med calendar closely and bring to each visit.  follow up Dr. Melvyn Novas  In 3 weeks and As needed     04/08/2013 f/u ov/Wert re: chronic cough > sob  since Jan 2014 extubation  Chief Complaint  Patient presents with  . Follow-up    Pt states that his breathing seems to be doing some better. His cough is more prod with pale yellow sputum- esp on days when only takes 10 m pred daily.  Not clear he's following med calendar "I have my own system" rec Increase dulera to 200 Take 2 puffs first thing in am and then another 2 puffs about 12 hours later.  Add singulair 10 mg one each bedtime Use mucinex dm and flutter valve as per calendar   04/22/2013 f/u ov/Wert re: chronic cough with marked elevation of IgE c/w cough variant asthma Chief Complaint  Patient presents with  . Follow-up    Breathing is slightly better. Productive cough pale yellow phlem. No wheezing. No chest tx   Organization of meds in bags but not correlating with calendar - not sure he's following any of the instructions given.   Typically does respond somewhat to short courses of prednisone. >>pred 20mg    05/13/2013 Follow up and med review  Patient returns for a followup and medication review. We reviewed all his medications and organized them into a medication calendar with patient education Appears he is taking her meds correctly.  Last ov was recommended to increase prednisone 20mg  daily. Did not see much improvement in cough.  Cough is productive with clear to light yellow mucus on/off . Using mucinex DM with some help.  He remains acitve.  Rides bike often, -now rides. recumbent trike.  Avid cyclist-has ridden in all [place] states.  rec Add Chlorpheniramine 4mg  2 tabs At bedtime   Change to Mucinex 1-2 Twice daily  As needed   Cough/congestion  Add Delsym 2 tsp Twice daily  As needed  Cough  Decrease Prednisone 10mg  daily  Follow med calendar closely and bring to each visit.  Goal is to not cough, use sugarless candy and water to help soothe throat  Avoid coughing and throat clearing  NO MINTS.   06/24/2013 f/u ov/Wert re: cough/ prednisone 10 mg daily  Chief Complaint  Patient presents with  . Follow-up    Pt states cough is some improved since his last visit. No new co's today.   able to bike up to 11 miles per day vs 17 prior to ET but not biking as much due to weather.   No resp medications at all prior to his flu in Jan 2014  Sleeps well and no flare in am of cough until stirs around then gets up a little slt yellow mucus. rec Reduce prednisone to 10 mg one half daily Increase chlortrimeton to 4 mg 2 at bedtime as per calendar For cough > mucinex dm up to 1200 mg every 12 hours and use flutter as much as possible  Please schedule a follow up office visit in 4 weeks, sooner if needed    07/22/2013 f/u ov/Wert re: chronic cough / steroid dep ? Cough variant asthma   Chief Complaint  Patient presents with  . Follow-up    Pt states cough is about 50% better. Still prod with yellow sputum.   >>Omnicef x 10 d  Ok to stop duoneb and singulair  as not helping omnicef 300 mg twice daily x 10 days to see if changes the color of mucus if still purulent sputum then need fob next step - if no purulent sputum but still bad cough do allergy eval next   08/26/2013 Follow up and Med Review  Returns for follow up and med review  We reviewed all his meds and organized them into a med calendar.  Singulair stopped last ov, forgot to stop. Discussed plan today  Remains on prednisone 10mg  daily .  Last ov rx Omnicef , cough is better, minimal mucus.  Feels he is improved overall.  rec Decrease Prednisone 10mg  1/2 daily for 2 weeks, then 1/2 every other day for 1 week and stop .  Watch blood sugars as they may drop off  steroids .  Stop singulair .  Follow med calendar closely and bring to each visit.     09/09/2013 f/u ov/Wert re: recurrent cough/ did not bring med calendar/ still on symbicort 160 2bid but ? Just samples? Chief Complaint  Patient presents with  . Acute Visit    Pt c/o increased cough and "rattling" in chest x 1 wk. Cough is prod with minimal to moderate pale yellow sputum.     Recurred w/in a week of stopping pred/ singulair rec singulair 10 mg one daily in pm until return Prednsione 10 mg 2 daily until better, then one daily  Omnicef is 300 mg twice daily x 10 days    09/30/2013 f/u ov/Wert re: chronic cough  Chief Complaint  Patient presents with  . Follow-up    Pt states that his cough has improved some. He still has some cough- prod with minimal yellow to green sputum.  "Rattle" in chest in some better.    symbicort count is 38/60  17 miles on bike x 2 days - Not limited by breathing from desired activities   >steroid base 20mg  w/ flare and 10mg  baseline  11/11/2013 Follow up and Med review  Returns for follow up and med review  We reviewed all his meds and organized them into a med calendar. Appears to be taking correctly.  Remains on prednisone 10mg  daily .  Feels he is improved overall. Does have cough but less. Has throat clearing throughout exam however he does not recognize that he does this.  Remains active , bikes and walks during week Active in cards.  Patient denies any hemoptysis, orthopnea, PND, or leg swelling  Current Medications, Allergies, Complete Past Medical History, Past Surgical History, Family History, and Social History were reviewed in [employer] record.  ROS  The following are not active complaints unless bolded sore throat, dysphagia, dental problems, itching, sneezing,  nasal congestion or excess/ purulent secretions, ear ache,   fever, chills, sweats, unintended wt loss, pleuritic or exertional cp, hemoptysis,  orthopnea  pnd or leg swelling, presyncope, palpitations, heartburn, abdominal pain, anorexia, nausea, vomiting, diarrhea  or change in bowel or urinary habits, change in stools or urine, dysuria,hematuria,  rash, arthralgias, visual complaints, headache, numbness weakness or ataxia or problems with walking or coordination,  change in mood/affect or memory.          Objective:   Physical Exam  amb obese wm nad    Head: Normocephalic and atraumatic.  Right Ear: External ear normal.  Left Ear: External ear normal.  Mouth/Throat: Oropharynx is clear and moist. No oropharyngeal exudate.  Eyes: Conjunctivae and EOM are normal. Pupils are equal, round, and reactive to light. Right eye exhibits no discharge. Left eye exhibits no discharge. No scleral icterus.  Neck: Normal range of motion. Neck supple. No JVD present. No tracheal deviation present. No thyromegaly present.  Cardiovascular: Normal rate, regular rhythm and intact distal pulses.  Exam reveals no gallop and no friction rub.   No murmur heard. Pulmonary/Chest :  Diminished BS in bases , no wheezing  Abdominal: Soft. Bowel sounds are normal. He exhibits no distension and no mass. There is no tenderness. There is no rebound and no guarding.  Musculoskeletal: Normal range of motion. He exhibits no edema  and no tenderness.  Lymphadenopathy:    He has no cervical adenopathy.  Neurological: He is alert and oriented to person, place, and time. He has normal reflexes. No cranial nerve deficit. Coordination normal.  Skin: Skin is warm and dry. No rash noted. He is not diaphoretic. No erythema. No pallor.    CXR  03/04/2013 : Streaky left basilar atelectasis, scarring or infiltrate.    CT sinus 03/05/2013 Mild deviation of the nasal septum to the right. No evidence of sinusitis.      Assessment & Plan:

## 2013-11-11 NOTE — Assessment & Plan Note (Signed)
Compensated on present regimen  Patient's medications were reviewed today and patient education was given. Computerized medication calendar was adjusted/completed  Will refer to allergist d/t high IgE , +rast test and frequent flare  Cont w/ trigger control   Plan  Follow med calendar closely and bring to each visit.  May use extra Chlortrimeton in am if needed for drainage , throat clearing.  We are referring you to Dr. Annamaria Boots Allergist in our office  Follow up Dr. Melvyn Novas  In  2 months and As needed

## 2013-11-12 ENCOUNTER — Telehealth: Payer: Self-pay | Admitting: Endocrinology

## 2013-11-12 ENCOUNTER — Other Ambulatory Visit: Payer: Self-pay | Admitting: *Deleted

## 2013-11-12 MED ORDER — ALCOHOL WIPES 70 % PADS: MEDICATED_PAD | Status: DC

## 2013-11-12 NOTE — Telephone Encounter (Signed)
Please call in rx for alcohol wipes to [facility]

## 2013-11-12 NOTE — Telephone Encounter (Signed)
rx sent

## 2013-11-20 NOTE — Addendum Note (Signed)
Addended by: Maurice March on: 11/20/2013 04:13 PM   Modules accepted: Orders

## 2013-11-29 ENCOUNTER — Other Ambulatory Visit: Payer: Self-pay | Admitting: Internal Medicine

## 2013-12-10 ENCOUNTER — Other Ambulatory Visit: Payer: Medicare PPO

## 2013-12-14 ENCOUNTER — Telehealth: Payer: Self-pay | Admitting: Endocrinology

## 2013-12-14 NOTE — Telephone Encounter (Signed)
Pt needs [place] to call in to [facility] the lantus to be filled after 12/18/13. Pt on vacay right now

## 2013-12-15 ENCOUNTER — Other Ambulatory Visit: Payer: Self-pay | Admitting: *Deleted

## 2013-12-15 MED ORDER — INSULIN GLARGINE 100 UNIT/ML ~~LOC~~ SOLN: SUBCUTANEOUS | Status: DC

## 2013-12-16 ENCOUNTER — Ambulatory Visit: Payer: Medicare PPO | Admitting: Endocrinology

## 2013-12-25 ENCOUNTER — Other Ambulatory Visit: Payer: Self-pay | Admitting: Internal Medicine

## 2013-12-28 ENCOUNTER — Other Ambulatory Visit: Payer: Self-pay | Admitting: *Deleted

## 2013-12-28 MED ORDER — ZOLPIDEM TARTRATE 5 MG PO TABS: 5.0000 mg | ORAL_TABLET | Freq: Every evening | ORAL | Status: DC | PRN

## 2013-12-29 ENCOUNTER — Telehealth: Payer: Self-pay | Admitting: Endocrinology

## 2013-12-29 NOTE — Telephone Encounter (Signed)
Patient states that he had lab work done at [facility]  Can we please get the results   Thank you

## 2014-01-01 ENCOUNTER — Other Ambulatory Visit: Payer: Self-pay | Admitting: *Deleted

## 2014-01-01 ENCOUNTER — Ambulatory Visit (INDEPENDENT_AMBULATORY_CARE_PROVIDER_SITE_OTHER): Payer: Medicare PPO | Admitting: Endocrinology

## 2014-01-01 ENCOUNTER — Encounter: Payer: Self-pay | Admitting: Endocrinology

## 2014-01-01 ENCOUNTER — Ambulatory Visit: Payer: Medicare PPO | Admitting: Endocrinology

## 2014-01-01 VITALS — BP 149/72 | HR 109 | Temp 98.5°F | Resp 16 | Ht 70.5 in | Wt 238.4 lb

## 2014-01-01 DIAGNOSIS — E1165 Type 2 diabetes mellitus with hyperglycemia: Principal | ICD-10-CM

## 2014-01-01 DIAGNOSIS — IMO0001 Reserved for inherently not codable concepts without codable children: Secondary | ICD-10-CM

## 2014-01-01 DIAGNOSIS — E119 Type 2 diabetes mellitus without complications: Secondary | ICD-10-CM

## 2014-01-01 DIAGNOSIS — N183 Chronic kidney disease, stage 3 unspecified: Secondary | ICD-10-CM

## 2014-01-01 DIAGNOSIS — I10 Essential (primary) hypertension: Secondary | ICD-10-CM

## 2014-01-01 MED ORDER — DILTIAZEM HCL ER COATED BEADS 120 MG PO CP24: 120.0000 mg | ORAL_CAPSULE | Freq: Every day | ORAL | Status: DC

## 2014-01-01 NOTE — Patient Instructions (Signed)
Reduce Lantus to 22 on days going biking   Lantus 28 twice daily  Adjust Humalog based on amount of Carbs or size of meals

## 2014-01-01 NOTE — Progress Notes (Signed)
Patient ID: Clayton [name], male   DOB: 1928/03/24, 80 y.o.   MRN: [id number]   Reason for Appointment: Followup of various issues  History of Present Illness   Type 2 DIABETES MELITUS   He has had long-standing diabetes and has been on basal bolus insulin for the last few years Since 2014 he has required larger doses of insulin especially with having to take periodic courses of steroids for his pulmonary problems Also his Actos was stopped because of tendency to edema and metformin stopped because of renal dysfunction  He has again been on 10 mg of prednisone for his respiratory symptoms from pulmonologist  Was started on 20 mg couple of weeks  ago He has gone up on his evening Lantus insulin mostly since his last visit but not the morning dose Blood sugars show considerable variability especially midday and evening and generally lower late in the evening Does not usually adjust his insulin based on what he is eating and is usually getting fixed doses of Humalog given when blood sugars are high Fasting blood sugars are overall fairly good and sometimes low normal He has started doing a little exercise with walking or biking once a week without causing hypoglycemia Does not adjust his insulin based on his exercise level Overall weight is about the same  Hypoglycemia:  lowest reading 65 only 2 readings low normal in the afternoon   Oral hypoglycemic drugs: None      Side effects from medications: None Insulin regimen: Humalog 20 units before meals, Lantus recently 24 units a.m. --30 units p.m.       Monitors blood glucose:  3.1 times a day   Glucometer:         Glucose  readings:   PREMEAL Breakfast Lunch 4-6 PM   7-11 PM  Overall  Glucose range:  78-223    65-410     Mean/median:    200    164+/-71   Meals: 3 meals per day.  breakfast: oatmeal, grits, some cheese        Physical activity: exercise: walking 1 mile, 4-5/7; occ biking  Weight history:  Wt Readings from Last 3  Encounters:  01/01/14 238 lb 6.4 oz (108.138 kg)  11/11/13 237 lb 3.2 oz (107.593 kg)  09/30/13 235 lb (106.595 kg)               Complications are: Nephropathy. Microalbumin level now nearly normal   LABS:  Lab Results  Component Value Date   HGBA1C 7.4* 07/15/2013   HGBA1C 7.9* 04/01/2013   HGBA1C 7.5* 12/29/2012   Lab Results  Component Value Date   MICROALBUR 32.4* 09/14/2013   LDLCALC 83 09/14/2013   CREATININE 2.5* 09/14/2013    Problem 2:  RENAL insufficiency: He has had mild chronic increase in creatinine, was 1.7 in 2014. This has been related to nephropathy and glomerulosclerosis However his level was increased to 2.6 in 06/29/13  Nephrology consultation done, no specific diagnosis made Renal ultrasound did not show any abnormality Most recent creatinine is slightly better at 2.0 from outside lab        Medication List       This list is accurate as of: 01/01/14  4:18 PM.  Always use your most recent med list.               ACCU-CHEK AVIVA PLUS W/DEVICE Kit  Use to check blood sugars 4 times per day dx code 250.00     ACCU-CHEK AVIVA  Soln  Use as directed     ACCU-CHEK SOFTCLIX LANCETS lancets  Use as instructed to obtain a blood specimen 4 times per day     Alcohol Wipes 70 % Pads  Use 7 pads per day     budesonide-formoterol 160-4.5 MCG/ACT inhaler  Commonly known as:  SYMBICORT  Inhale 2 puffs into the lungs 2 (two) times daily.     budesonide-formoterol 160-4.5 MCG/ACT inhaler  Commonly known as:  SYMBICORT  Inhale 2 puffs into the lungs 2 (two) times daily.     chlorpheniramine 4 MG tablet  Commonly known as:  CHLOR-TRIMETON  2 tablets by mouth at bedtime. 1 extra in am daily as needed.     dextromethorphan-guaiFENesin 30-600 MG per 12 hr tablet  Commonly known as:  MUCINEX DM  1-2 every 12 hours as needed for cough (with flutter valve)     famotidine 20 MG tablet  Commonly known as:  PEPCID  Take 1 tablet by mouth at bedtime.      Ferrous Sulfate 27 MG Tabs  Take 1 tablet by mouth daily.     FIBER PO  1 every am and 1 at bedtime     finasteride 5 MG tablet  Commonly known as:  PROSCAR  Take 1 tablet (5 mg total) by mouth daily.     FLUTTER Devi  Use as directed.     glucose blood test strip  Commonly known as:  ACCU-CHEK AVIVA PLUS  Use as instructed to check blood sugar 4 times per day dx code 250.00     insulin glargine 100 UNIT/ML injection  Commonly known as:  LANTUS  Inject 24 units in the am and 20 units in the evening     insulin lispro 100 UNIT/ML injection  Commonly known as:  HUMALOG  Sliding scale with each meal     montelukast 10 MG tablet  Commonly known as:  SINGULAIR  TAKE 1 TABLET BY MOUTH AT BEDTIME     multivitamin with minerals Tabs tablet  Take 1 tablet by mouth daily.     pantoprazole 40 MG tablet  Commonly known as:  PROTONIX  TAKE 1 TABLET (40 MG TOTAL) BY MOUTH DAILY. TAKE 30-60 MIN BEFORE FIRST MEAL OF THE DAY     predniSONE 10 MG tablet  Commonly known as:  DELTASONE  Take 10 mg by mouth daily with breakfast.     predniSONE 10 MG tablet  Commonly known as:  DELTASONE  TAKE 2 TABLETS BY MOUTH EACH MORNING UNTIL BETTER THEN 1 TABLET DAILY     simvastatin 20 MG tablet  Commonly known as:  ZOCOR  Take 1 tablet (20 mg total) by mouth daily.     tolterodine 4 MG 24 hr capsule  Commonly known as:  DETROL LA  Take 4 mg by mouth daily.     Vitamin D3 2000 UNITS Tabs  Take 1 capsule by mouth every morning.     zolpidem 5 MG tablet  Commonly known as:  AMBIEN  Take 1 tablet (5 mg total) by mouth at bedtime as needed for sleep.        Allergies:  Allergies  Allergen Reactions  . Penicillins     Past Medical History  Diagnosis Date  . Diabetes mellitus   . Hypertension   . Anemia   . Chronic renal insufficiency   . Osteoporosis   . Neuromuscular disorder   . DDD (degenerative disc disease), lumbar     Past Surgical History  Procedure Laterality Date   . Vasectomy    . Eye surgery    . Video bronchoscopy Bilateral 08/25/2012    Procedure: VIDEO BRONCHOSCOPY WITHOUT FLUORO;  Surgeon: Brand Males, MD;  Location: [place];  Service: Cardiopulmonary;  Laterality: Bilateral;    Family History  Problem Relation Age of Onset  . Diabetes Mellitus II    . Hypertension      Social History:  reports that he quit smoking about 40 years ago. His smoking use included Cigarettes. He has a 99 pack-year smoking history. He quit smokeless tobacco use about 38 years ago. He reports that he does not drink alcohol or use illicit drugs.  Review of Systems -   Previous history of anemia related to renal dysfunction, recent hemoglobin is 12.4 in July  Insomnia: better with  Ambien 5 mg  He still has some persistent cough  despite taking prednisone  He is able to sleep at night with his CPAP   History of hypertension: Currently not requiring any medications; blood pressure is fairly good at home  Edema: Not present at this time, also not taking any Lasix  He has had lower urinary tract problems and BPH, currently taking Proscar and Detrol but his insurance wants him to take oxybutynin. Prescriptions are being done by his urologist  HYPERLIPIDEMIA:         The lipid abnormality consists of  minimally elevated LDL, taking simvastatin for cardiovascular protection. Has good HDL levels and good overall control  Lab Results  Component Value Date   CHOL 156 09/14/2013   HDL 54.10 09/14/2013   LDLCALC 83 09/14/2013   TRIG 95.0 09/14/2013   CHOLHDL 3 09/14/2013       Examination:   BP 149/72  Pulse 109  Temp(Src) 98.5 F (36.9 C)  Resp 16  Ht 5' 10.5" (1.791 m)  Wt 238 lb 6.4 oz (108.138 kg)  BMI 33.71 kg/m2  SpO2 95%  Body mass index is 33.71 kg/(m^2).   No edema of ankles  Diabetic foot exam shows nearly normal monofilament sensation in the toes and plantar surfaces, no skin lesions or ulcers on the feet and normal pedal  pulses   Assesment/PLAN:   1. Diabetes type 2, uncontrolled  The patient's diabetes control appears to be somewhat worse although recently his blood sugars appear to be improving He has marked variability in his blood sugars probably related to use of prednisone, variability in his diet and activity Most likely is getting better with reducing prednisone and starting a little exercise See history of present illness for detailed discussion current management, although was identified and blood sugar patterns Again taking more insulin than he did a few years ago when he was also taking insulin sensitizers like metformin and Actos He is not adjusting his mealtime coverage based on activity level, glucose level or type of meal he is eating and this was discussed Since his blood sugars are relatively higher at suppertime and lower in the morning he can change his Lantus to equal doses both morning and evening, discussed adjusting the dose again if needed   2.  Renal insufficiency: His creatinine is  stable and now followed by nephrology;  etiology of his renal function is unclear He is unable to take ACE inhibitors because of cough, ARB drugs and [place] because of hyperkalemia in the past and this will become indicated to his nephrologist   3. Lipid management: Adequately controlled with simvastatin  Counseling time over 50% of today's  25 minute visit  Delrico Minehart  01/01/2014, 4:18 PM   Addendum: A1c is 7%, lower than expected; will also check fructosamine on next visit

## 2014-01-02 LAB — HEMOGLOBIN A1C
Est. average glucose Bld gHb Est-mCnc: 154 mg/dL
HEMOGLOBIN A1C: 7 % — AB (ref 4.8–5.6)

## 2014-01-02 NOTE — Progress Notes (Signed)
Quick Note:  Please let patient know that the lab result is 7 and no further action needed ______

## 2014-01-06 ENCOUNTER — Ambulatory Visit: Payer: Medicare PPO | Admitting: Internal Medicine

## 2014-01-07 NOTE — Telephone Encounter (Signed)
Patient said that pharmacy told him that Meds Zolpidem titrate has high risk side affect for patient 49 or older, And he is that group, and would like to know if you could prescribe him something different.  Thank you

## 2014-01-07 NOTE — Telephone Encounter (Signed)
Only thing that was changed was his Lantus. If the sugar is not high at suppertime then the only thing to do is to increase his morning Humalog by 4 units

## 2014-01-07 NOTE — Telephone Encounter (Signed)
Noted, patient is aware and he said he's going to try taking tylenol PM.  He also said since you've changed his bp medicine his sugar has increased, he states he eats the same thing for breakfast every day and his sugars have been over 200 at lunchtime.  Please advise

## 2014-01-07 NOTE — Telephone Encounter (Signed)
Patient is aware 

## 2014-01-07 NOTE — Telephone Encounter (Signed)
All the sleeping pills have the same possible side effects. He can try to reduce the dose if possible to half a tablet

## 2014-01-07 NOTE — Telephone Encounter (Signed)
Please see below and advise.

## 2014-01-13 LAB — HM DIABETES EYE EXAM

## 2014-01-14 ENCOUNTER — Encounter: Payer: Self-pay | Admitting: *Deleted

## 2014-01-28 ENCOUNTER — Telehealth: Payer: Self-pay | Admitting: Endocrinology

## 2014-01-28 NOTE — Telephone Encounter (Signed)
Pt needs [place] to fax in 90 day supply of humalog 66 u daily [phone] right source

## 2014-02-03 ENCOUNTER — Encounter: Payer: Self-pay | Admitting: Endocrinology

## 2014-02-03 ENCOUNTER — Ambulatory Visit (INDEPENDENT_AMBULATORY_CARE_PROVIDER_SITE_OTHER): Payer: Medicare PPO | Admitting: Endocrinology

## 2014-02-03 VITALS — BP 136/70 | HR 88 | Temp 98.1°F | Resp 16 | Ht 70.5 in | Wt 242.2 lb

## 2014-02-03 DIAGNOSIS — E1129 Type 2 diabetes mellitus with other diabetic kidney complication: Secondary | ICD-10-CM | POA: Insufficient documentation

## 2014-02-03 DIAGNOSIS — E1165 Type 2 diabetes mellitus with hyperglycemia: Principal | ICD-10-CM

## 2014-02-03 DIAGNOSIS — N058 Unspecified nephritic syndrome with other morphologic changes: Secondary | ICD-10-CM

## 2014-02-03 NOTE — Patient Instructions (Addendum)
Check some sugars at bedtime  If sugar stays below 80 in ams may reduce pm Lantus to 26

## 2014-02-03 NOTE — Progress Notes (Signed)
Patient ID: Clayton [name], male   DOB: 1927-07-21, 80 y.o.   MRN: [id number]   Reason for Appointment: Followup of various issues  History of Present Illness   Type 2 DIABETES MELITUS   He has had long-standing diabetes and has been on basal bolus insulin for the last few years Since 2014 he has required larger doses of insulin especially with having to take periodic courses of steroids for his pulmonary problems Also his Actos was stopped because of tendency to edema and metformin stopped because of renal dysfunction  He still is taking his 10 mg of prednisone for his respiratory symptoms from pulmonologist  On his last visit his Lantus was increased in the morning and reduce slightly in the evening to help with daytime hyperglycemia His blood sugars have been quite erratic again although overall slightly better He is checking his blood sugars quite consistently but does not always take extra insulin when sugars are high Also not checking blood sugars after her evening meal as directed In the last week or so his blood sugars have been significantly better and more consistent with only sporadic high readings, we'll not clear why  early morning readings are sometimes high but as low as 71 today  Hypoglycemia:  lowest reading 69    Oral hypoglycemic drugs: None      Side effects from medications: None Insulin regimen: Humalog 21 units before meals, Lantus recently 24 units a.m. --30 units p.m.       Monitors blood glucose:  2.9 times a day   Glucometer:         Glucose  readings recently, with averages for the last 30 days:   PREMEAL Breakfast Lunch Dinner Bedtime Overall  Glucose range:  71-243   82-257   139-363  ?  69-363   Mean/median:  140    200    172     Meals: 3 meals per day.  breakfast: oatmeal, grits, some cheese at 6 am        Physical activity: exercise: walking 1 mile, 4-5/7 days a week  Weight history:  Wt Readings from Last 3 Encounters:  02/03/14 242 lb 3.2 oz  (109.861 kg)  01/01/14 238 lb 6.4 oz (108.138 kg)  11/11/13 237 lb 3.2 oz (107.593 kg)               Complications are: Nephropathy. Microalbumin level now nearly normal   LABS:  Lab Results  Component Value Date   HGBA1C 7.0* 01/01/2014   HGBA1C 7.4* 07/15/2013   HGBA1C 7.9* 04/01/2013   Lab Results  Component Value Date   MICROALBUR 32.4* 09/14/2013   LDLCALC 83 09/14/2013   CREATININE 2.5* 09/14/2013    Problem 2:  RENAL insufficiency: He has had mild chronic increase in creatinine, was 1.7 in 2014. This has been related to nephropathy and glomerulosclerosis However his level was increased to 2.6 in 06/29/13  Nephrology consultation done, no specific diagnosis made; ACE inhibitor/ARB recommended but have discussed with nephrologist today and given him history of patient getting hyperkalemia with these drugs in the past Renal ultrasound did not show any abnormality     Medication List       This list is accurate as of: 02/03/14  3:56 PM.  Always use your most recent med list.               ACCU-CHEK AVIVA PLUS W/DEVICE Kit  Use to check blood sugars 4 times per day dx code 250.00  ACCU-CHEK AVIVA Soln  Use as directed     ACCU-CHEK SOFTCLIX LANCETS lancets  Use as instructed to obtain a blood specimen 4 times per day     Alcohol Wipes 70 % Pads  Use 7 pads per day     budesonide-formoterol 160-4.5 MCG/ACT inhaler  Commonly known as:  SYMBICORT  Inhale 2 puffs into the lungs 2 (two) times daily.     budesonide-formoterol 160-4.5 MCG/ACT inhaler  Commonly known as:  SYMBICORT  Inhale 2 puffs into the lungs 2 (two) times daily.     chlorpheniramine 4 MG tablet  Commonly known as:  CHLOR-TRIMETON  2 tablets by mouth at bedtime. 1 extra in am daily as needed.     dextromethorphan-guaiFENesin 30-600 MG per 12 hr tablet  Commonly known as:  MUCINEX DM  1-2 every 12 hours as needed for cough (with flutter valve)     diltiazem 120 MG 24 hr capsule  Commonly  known as:  CARDIZEM CD  Take 1 capsule (120 mg total) by mouth daily.     famotidine 20 MG tablet  Commonly known as:  PEPCID  Take 1 tablet by mouth at bedtime.     Ferrous Sulfate 27 MG Tabs  Take 1 tablet by mouth daily.     FIBER PO  1 every am and 1 at bedtime     finasteride 5 MG tablet  Commonly known as:  PROSCAR  Take 1 tablet (5 mg total) by mouth daily.     FLUTTER Devi  Use as directed.     glucose blood test strip  Commonly known as:  ACCU-CHEK AVIVA PLUS  Use as instructed to check blood sugar 4 times per day dx code 250.00     insulin glargine 100 UNIT/ML injection  Commonly known as:  LANTUS  28 Units 2 (two) times daily.     insulin lispro 100 UNIT/ML injection  Commonly known as:  HUMALOG  21 Units. Sliding scale with each meal     montelukast 10 MG tablet  Commonly known as:  SINGULAIR  TAKE 1 TABLET BY MOUTH AT BEDTIME     multivitamin with minerals Tabs tablet  Take 1 tablet by mouth daily.     pantoprazole 40 MG tablet  Commonly known as:  PROTONIX  TAKE 1 TABLET (40 MG TOTAL) BY MOUTH DAILY. TAKE 30-60 MIN BEFORE FIRST MEAL OF THE DAY     predniSONE 10 MG tablet  Commonly known as:  DELTASONE  Take 10 mg by mouth daily with breakfast.     PROAIR HFA 108 (90 BASE) MCG/ACT inhaler  Generic drug:  albuterol     simvastatin 20 MG tablet  Commonly known as:  ZOCOR  Take 1 tablet (20 mg total) by mouth daily.     tolterodine 4 MG 24 hr capsule  Commonly known as:  DETROL LA  Take 4 mg by mouth daily.     Vitamin D3 2000 UNITS Tabs  Take 1 capsule by mouth every morning.     zolpidem 5 MG tablet  Commonly known as:  AMBIEN  Take 1 tablet (5 mg total) by mouth at bedtime as needed for sleep.        Allergies:  Allergies  Allergen Reactions  . Penicillins     Past Medical History  Diagnosis Date  . Diabetes mellitus   . Hypertension   . Anemia   . Chronic renal insufficiency   . Osteoporosis   . Neuromuscular disorder   .  DDD (degenerative disc disease), lumbar     Past Surgical History  Procedure Laterality Date  . Vasectomy    . Eye surgery    . Video bronchoscopy Bilateral 08/25/2012    Procedure: VIDEO BRONCHOSCOPY WITHOUT FLUORO;  Surgeon: Brand Males, MD;  Location: [facility];  Service: Cardiopulmonary;  Laterality: Bilateral;    Family History  Problem Relation Age of Onset  . Diabetes Mellitus II    . Hypertension      Social History:  reports that he quit smoking about 40 years ago. His smoking use included Cigarettes. He has a 99 pack-year smoking history. He quit smokeless tobacco use about 38 years ago. He reports that he does not drink alcohol or use illicit drugs.  Review of Systems -   Previous history of anemia related to renal dysfunction, recent hemoglobin is 12.4 in July  Insomnia: better with  Ambien 5 mg  He still has some persistent cough  despite taking prednisone, is due for followup  He is able to sleep at night with his CPAP   History of hypertension: Currently not requiring any medications   Edema: Not present at this time, also not taking any Lasix  He has had lower urinary tract problems and BPH, currently taking Proscar and Detrol but his insurance wants him to take oxybutynin. Prescriptions are being done by his urologist  HYPERLIPIDEMIA:         The lipid abnormality consists of  minimally elevated LDL, taking simvastatin for cardiovascular protection. Has good HDL levels and good overall control  Lab Results  Component Value Date   CHOL 156 09/14/2013   HDL 54.10 09/14/2013   LDLCALC 83 09/14/2013   TRIG 95.0 09/14/2013   CHOLHDL 3 09/14/2013     Diabetic foot exam results in 7/15: nearly normal monofilament sensation in the toes and plantar surfaces, no skin lesions or ulcers on the feet and normal pedal pulses   Examination:   BP 145/91  Pulse 88  Temp(Src) 98.1 F (36.7 C)  Resp 16  Ht 5' 10.5" (1.791 m)  Wt 242 lb 3.2 oz (109.861 kg)  BMI  34.25 kg/m2  SpO2 96%  Body mass index is 34.25 kg/(m^2).   Repeat blood pressure 136/70 standing  No edema of ankles   Assesment/PLAN:   1. Diabetes type 2, uncontrolled  The patient's diabetes control is somewhat erratic again with fluctuating blood sugars Even with increasing his Lantus on the last visit his readings before supper are the highest and may sometimes be going up after lunch but not clear if this is or diet related In the last week his blood sugars are much more consistent  Not clear when he will be taken off prednisone, currently on 10 mg  2.  Renal insufficiency: His creatinine is  stable and now followed by nephrology;  etiology of his renal function is unclear He is unable to take ACE inhibitors because of cough, ARB drugs and [place] because of hyperkalemia in the past and was today indicated to his nephrologist Recent labs from nephrologist pending    [facility]  02/03/2014, 3:56 PM

## 2014-02-04 ENCOUNTER — Encounter: Payer: Self-pay | Admitting: Endocrinology

## 2014-02-04 ENCOUNTER — Other Ambulatory Visit: Payer: Self-pay | Admitting: *Deleted

## 2014-02-04 ENCOUNTER — Telehealth: Payer: Self-pay | Admitting: Endocrinology

## 2014-02-04 MED ORDER — INSULIN LISPRO 100 UNIT/ML ~~LOC~~ SOLN
21.0000 [IU] | Freq: Three times a day (TID) | SUBCUTANEOUS | Status: DC
Start: 2014-02-04 — End: 2014-10-01

## 2014-02-04 MED ORDER — INSULIN LISPRO 100 UNIT/ML ~~LOC~~ SOLN
21.0000 [IU] | Freq: Three times a day (TID) | SUBCUTANEOUS | Status: DC
Start: 2014-02-04 — End: 2014-02-04

## 2014-02-04 NOTE — Telephone Encounter (Signed)
Patient would like for you to call him back   Thank you

## 2014-02-15 ENCOUNTER — Other Ambulatory Visit: Payer: Self-pay | Admitting: *Deleted

## 2014-02-15 ENCOUNTER — Other Ambulatory Visit: Payer: Self-pay | Admitting: Endocrinology

## 2014-02-15 MED ORDER — ZOLPIDEM TARTRATE 5 MG PO TABS: 5.0000 mg | ORAL_TABLET | Freq: Every evening | ORAL | Status: DC | PRN

## 2014-02-15 MED ORDER — GLUCOSE BLOOD VI STRP: ORAL_STRIP | Status: DC

## 2014-02-15 NOTE — Telephone Encounter (Signed)
Patient need priorauthorization for med Ambien 90 day supply.and send it to [facility]. A fax will be sent to you for the request.

## 2014-03-03 ENCOUNTER — Other Ambulatory Visit: Payer: Self-pay | Admitting: Internal Medicine

## 2014-03-08 ENCOUNTER — Telehealth: Payer: Self-pay | Admitting: Endocrinology

## 2014-03-08 NOTE — Telephone Encounter (Signed)
Please see below.

## 2014-03-08 NOTE — Telephone Encounter (Signed)
Noted, message left on patients vm

## 2014-03-08 NOTE — Telephone Encounter (Signed)
Dr. Dwyane Dee the patient has not received the MS form

## 2014-03-08 NOTE — Telephone Encounter (Signed)
Will send in am

## 2014-03-16 ENCOUNTER — Other Ambulatory Visit: Payer: Self-pay | Admitting: Endocrinology

## 2014-03-19 ENCOUNTER — Other Ambulatory Visit: Payer: Self-pay | Admitting: Internal Medicine

## 2014-03-22 ENCOUNTER — Other Ambulatory Visit: Payer: Self-pay | Admitting: *Deleted

## 2014-03-22 ENCOUNTER — Other Ambulatory Visit: Payer: Self-pay | Admitting: Internal Medicine

## 2014-03-22 MED ORDER — DILTIAZEM HCL ER COATED BEADS 120 MG PO CP24: 120.0000 mg | ORAL_CAPSULE | Freq: Every day | ORAL | Status: DC

## 2014-03-29 ENCOUNTER — Other Ambulatory Visit: Payer: Self-pay | Admitting: *Deleted

## 2014-03-29 MED ORDER — DILTIAZEM HCL ER COATED BEADS 120 MG PO CP24: 120.0000 mg | ORAL_CAPSULE | Freq: Every day | ORAL | Status: DC

## 2014-04-05 ENCOUNTER — Telehealth: Payer: Self-pay | Admitting: *Deleted

## 2014-04-05 NOTE — Telephone Encounter (Signed)
Noted, patient is aware. 

## 2014-04-05 NOTE — Telephone Encounter (Signed)
This is a 24-hour drug, if he is not seeing a rash all over the body do not think this is an allergy problem

## 2014-04-05 NOTE — Telephone Encounter (Signed)
Patient called today, he said since he's gotten the refill of Diltiazem which he takes at night, that when he wakes up in the morning around his waist band he is itching very badly.  He wants to know if you can change his rx to something else?

## 2014-04-07 ENCOUNTER — Telehealth: Payer: Self-pay | Admitting: Endocrinology

## 2014-04-07 NOTE — Telephone Encounter (Signed)
He has taken benadryl x 2 last night and 3x  this morning still itching really bad Dr. McDermide checked it out and it is not shingles

## 2014-04-07 NOTE — Telephone Encounter (Signed)
He can stop the diltiazem and we can schedule a dermatology consultation. Is he having a rash? Where is the itching ?

## 2014-04-07 NOTE — Telephone Encounter (Signed)
Left message on vm

## 2014-04-07 NOTE — Telephone Encounter (Signed)
Please see below and advise.

## 2014-04-25 ENCOUNTER — Other Ambulatory Visit: Payer: Self-pay | Admitting: Internal Medicine

## 2014-04-30 ENCOUNTER — Other Ambulatory Visit (INDEPENDENT_AMBULATORY_CARE_PROVIDER_SITE_OTHER): Payer: Medicare PPO

## 2014-04-30 DIAGNOSIS — N183 Chronic kidney disease, stage 3 unspecified: Secondary | ICD-10-CM

## 2014-04-30 DIAGNOSIS — E1165 Type 2 diabetes mellitus with hyperglycemia: Secondary | ICD-10-CM

## 2014-04-30 DIAGNOSIS — N184 Chronic kidney disease, stage 4 (severe): Secondary | ICD-10-CM

## 2014-04-30 DIAGNOSIS — E1129 Type 2 diabetes mellitus with other diabetic kidney complication: Secondary | ICD-10-CM

## 2014-04-30 DIAGNOSIS — IMO0002 Reserved for concepts with insufficient information to code with codable children: Secondary | ICD-10-CM

## 2014-04-30 LAB — COMPREHENSIVE METABOLIC PANEL
ALT: 23 U/L (ref 0–53)
AST: 34 U/L (ref 0–37)
Albumin: 3.5 g/dL (ref 3.5–5.2)
Alkaline Phosphatase: 89 U/L (ref 39–117)
BUN: 42 mg/dL — ABNORMAL HIGH (ref 6–23)
CO2: 18 mEq/L — ABNORMAL LOW (ref 19–32)
Calcium: 9.1 mg/dL (ref 8.4–10.5)
Chloride: 110 mEq/L (ref 96–112)
Creatinine, Ser: 2 mg/dL — ABNORMAL HIGH (ref 0.4–1.5)
GFR: 34.38 mL/min — ABNORMAL LOW (ref >60.00)
Glucose, Bld: 54 mg/dL — ABNORMAL LOW (ref 70–99)
POTASSIUM: 4.3 meq/L (ref 3.5–5.1)
Sodium: 142 mEq/L (ref 135–145)
Total Bilirubin: 0.3 mg/dL (ref 0.2–1.2)
Total Protein: 7.1 g/dL (ref 6.0–8.3)

## 2014-04-30 LAB — CBC
HCT: 36.5 % — ABNORMAL LOW (ref 39.0–52.0)
Hemoglobin: 11.9 g/dL — ABNORMAL LOW (ref 13.0–17.0)
MCHC: 32.6 g/dL (ref 30.0–36.0)
MCV: 95.8 fl (ref 78.0–100.0)
Platelets: 322 10*3/uL (ref 150.0–400.0)
RBC: 3.81 Mil/uL — ABNORMAL LOW (ref 4.22–5.81)
RDW: 13.2 % (ref 11.5–15.5)
WBC: 9.5 10*3/uL (ref 4.0–10.5)

## 2014-04-30 LAB — LIPID PANEL
Cholesterol: 144 mg/dL (ref 0–200)
HDL: 37.7 mg/dL — ABNORMAL LOW (ref >39.00)
LDL Cholesterol: 92 mg/dL (ref 0–99)
NonHDL: 106.3
TRIGLYCERIDES: 74 mg/dL (ref 0.0–149.0)
Total CHOL/HDL Ratio: 4
VLDL: 14.8 mg/dL (ref 0.0–40.0)

## 2014-04-30 LAB — HEMOGLOBIN A1C: Hgb A1c MFr Bld: 6.5 % (ref 4.6–6.5)

## 2014-04-30 LAB — RENAL FUNCTION PANEL
Albumin: 3.5 g/dL (ref 3.5–5.2)
BUN: 42 mg/dL — ABNORMAL HIGH (ref 6–23)
CO2: 18 mEq/L — ABNORMAL LOW (ref 19–32)
Calcium: 9.1 mg/dL (ref 8.4–10.5)
Chloride: 110 mEq/L (ref 96–112)
Creatinine, Ser: 2 mg/dL — ABNORMAL HIGH (ref 0.4–1.5)
GFR: 34.38 mL/min — ABNORMAL LOW (ref >60.00)
Glucose, Bld: 54 mg/dL — ABNORMAL LOW (ref 70–99)
POTASSIUM: 4.3 meq/L (ref 3.5–5.1)
Phosphorus: 2.3 mg/dL (ref 2.3–4.6)
Sodium: 142 mEq/L (ref 135–145)

## 2014-05-04 LAB — FRUCTOSAMINE: Fructosamine: 253 umol/L (ref 190–270)

## 2014-05-05 ENCOUNTER — Ambulatory Visit (INDEPENDENT_AMBULATORY_CARE_PROVIDER_SITE_OTHER): Payer: Medicare PPO | Admitting: Endocrinology

## 2014-05-05 ENCOUNTER — Encounter: Payer: Self-pay | Admitting: Endocrinology

## 2014-05-05 VITALS — BP 126/74 | HR 104 | Temp 98.0°F | Resp 16 | Ht 70.5 in | Wt 247.4 lb

## 2014-05-05 DIAGNOSIS — E1121 Type 2 diabetes mellitus with diabetic nephropathy: Secondary | ICD-10-CM

## 2014-05-05 NOTE — Patient Instructions (Addendum)
Lantus 24 units 2x daily and keep am sugar in 90-130 range   Humalog 10-12 at meals

## 2014-05-05 NOTE — Progress Notes (Signed)
Patient ID: Clayton [name], male   DOB: 1928-05-13, 80 y.o.   MRN: [id number]   Reason for Appointment: Followup of various issues  History of Present Illness   Type 2 DIABETES MELITUS   He has had long-standing diabetes and has been on basal bolus insulin for the last few years Since 2014 he has required larger doses of insulin especially with having to take periodic courses of steroids for his pulmonary problems Also his Actos was stopped because of tendency to edema and metformin stopped because of renal dysfunction  He is now not taking his prednisone for about 2 months and his blood sugars are improving A1c is down to 6.5, probably somewhat lower than expected because of anemia and renal insufficiency His blood sugar download is not available for review today but most of his blood sugars have been fairly good and not fluctuating as much as with prednisone Generally checking blood sugars before meals or when he has symptoms Hypoglycemia: He thinks he has had occasional low blood sugars overnight and also with increased activity He has cut back a little on his Lantus but still has low normal fasting readings at times and the low sugar at 3 AM recently Previously taking 21 units of Humalog and is now taking 7 units less; however has not reduced it when he is getting out and being more active   Oral hypoglycemic drugs: None     Insulin regimen: Humalog 13 units before meals, Lantus recently 28 units a.m. --28 units p.m.       Monitors blood glucose:  2.9 times a day   Glucometer: Accu-Chek        Glucose  monitor download not available for review at this time     Meals: 3 meals per day.  breakfast: oatmeal, grits, some cheese at 6 am        Physical activity: exercise: raking leaves, walking 1 mile, 4-5/7 days a week, previously was riding his bike  Weight history:  Wt Readings from Last 3 Encounters:  05/05/14 247 lb 6.4 oz (112.22 kg)  02/03/14 242 lb 3.2 oz (109.861 kg)   01/01/14 238 lb 6.4 oz (108.138 kg)               Complications are: Nephropathy. Microalbumin level now nearly normal  LABS:  Lab Results  Component Value Date   HGBA1C 6.5 04/30/2014   HGBA1C 7.0* 01/01/2014   HGBA1C 7.4* 07/15/2013   Lab Results  Component Value Date   MICROALBUR 32.4* 09/14/2013   LDLCALC 92 04/30/2014   CREATININE 2.0* 04/30/2014   CREATININE 2.0* 04/30/2014     Problem 2:  RENAL insufficiency: He has had chronic increase in creatinine, was 1.7 in 2014. This has been related to nephropathy and glomerulosclerosis However his level was increased to 2.6 in 06/29/13  Nephrology consultation done, no specific diagnosis made; ACE inhibitor/ARB recommended but have discussed with nephrologist and given him history of patient getting hyperkalemia with these drugs in the past Renal ultrasound did not show any abnormality Also appears to have mild acidosis  Lab Results  Component Value Date   CREATININE 2.0* 04/30/2014   CREATININE 2.0* 04/30/2014   He was given diltiazem  For his proteinuria recently when blood pressure was relatively high on his last visit but he claims that it was causing itching on his abdominal area and he stopped it Itching is somewhat better      Medication List       This  list is accurate as of: 05/05/14  4:10 PM.  Always use your most recent med list.               ACCU-CHEK AVIVA PLUS W/DEVICE Kit  Use to check blood sugars 4 times per day dx code 250.00     ACCU-CHEK AVIVA Soln  Use as directed     ACCU-CHEK SOFTCLIX LANCETS lancets  Use as instructed to obtain a blood specimen 4 times per day     Alcohol Wipes 70 % Pads  Use 7 pads per day     budesonide-formoterol 160-4.5 MCG/ACT inhaler  Commonly known as:  SYMBICORT  Inhale 2 puffs into the lungs 2 (two) times daily.     budesonide-formoterol 160-4.5 MCG/ACT inhaler  Commonly known as:  SYMBICORT  Inhale 2 puffs into the lungs 2 (two) times daily.      SYMBICORT 160-4.5 MCG/ACT inhaler  Generic drug:  budesonide-formoterol     chlorpheniramine 4 MG tablet  Commonly known as:  CHLOR-TRIMETON  2 tablets by mouth at bedtime. 1 extra in am daily as needed.     dextromethorphan-guaiFENesin 30-600 MG per 12 hr tablet  Commonly known as:  MUCINEX DM  1-2 every 12 hours as needed for cough (with flutter valve)     diltiazem 120 MG 24 hr capsule  Commonly known as:  CARDIZEM CD  Take 1 capsule (120 mg total) by mouth daily.     famotidine 20 MG tablet  Commonly known as:  PEPCID  TAKE 1 TABLET BY MOUTH EVERY NIGHT AT BEDTIME     Ferrous Sulfate 27 MG Tabs  Take 1 tablet by mouth daily.     FIBER PO  1 every am and 1 at bedtime     finasteride 5 MG tablet  Commonly known as:  PROSCAR  TAKE 1 TABLET EVERY DAY     FLUTTER Devi  Use as directed.     glucose blood test strip  Commonly known as:  ACCU-CHEK AVIVA PLUS  USE AS INSTRUCTED TO CHECK BLOOD SUGAR FOUR TIMES DAILY dx code 250.02     insulin glargine 100 UNIT/ML injection  Commonly known as:  LANTUS  28 Units 2 (two) times daily.     insulin lispro 100 UNIT/ML injection  Commonly known as:  HUMALOG  Inject 0.21 mLs (21 Units total) into the skin 3 (three) times daily with meals. Sliding scale with each meal     montelukast 10 MG tablet  Commonly known as:  SINGULAIR  TAKE 1 TABLET BY MOUTH EVERY NIGHT AT BEDTIME     multivitamin with minerals Tabs tablet  Take 1 tablet by mouth daily.     pantoprazole 40 MG tablet  Commonly known as:  PROTONIX  TAKE 1 TABLET (40 MG TOTAL) BY MOUTH DAILY. TAKE 30-60 MIN BEFORE FIRST MEAL OF THE DAY     predniSONE 10 MG tablet  Commonly known as:  DELTASONE  Take 10 mg by mouth daily with breakfast.     PROAIR HFA 108 (90 BASE) MCG/ACT inhaler  Generic drug:  albuterol     simvastatin 20 MG tablet  Commonly known as:  ZOCOR  TAKE 1 TABLET EVERY DAY     tolterodine 4 MG 24 hr capsule  Commonly known as:  DETROL LA  Take 4  mg by mouth daily.     Vitamin D3 2000 UNITS Tabs  Take 1 capsule by mouth every morning.     zolpidem 5 MG tablet  Commonly known as:  AMBIEN  Take 1 tablet (5 mg total) by mouth at bedtime as needed for sleep.        Allergies:  Allergies  Allergen Reactions  . Penicillins   . Diltiazem Rash    Past Medical History  Diagnosis Date  . Diabetes mellitus   . Hypertension   . Anemia   . Chronic renal insufficiency   . Osteoporosis   . Neuromuscular disorder   . DDD (degenerative disc disease), lumbar     Past Surgical History  Procedure Laterality Date  . Vasectomy    . Eye surgery    . Video bronchoscopy Bilateral 08/25/2012    Procedure: VIDEO BRONCHOSCOPY WITHOUT FLUORO;  Surgeon: Brand Males, MD;  Location: [facility];  Service: Cardiopulmonary;  Laterality: Bilateral;    Family History  Problem Relation Age of Onset  . Diabetes Mellitus II    . Hypertension      Social History:  reports that he quit smoking about 40 years ago. His smoking use included Cigarettes. He has a 99 pack-year smoking history. He quit smokeless tobacco use about 38 years ago. He reports that he does not drink alcohol or use illicit drugs.  Review of Systems -   HYPERTENSION: This has been mild in the past.  He was given diltiazem when blood pressure was relatively high on his last visit but he claims that it was causing itching on his abdominal area and he stopped it Itching is somewhat better  Previous history of anemia related to renal dysfunction, recent hemoglobin is  Stable  Lab Results  Component Value Date   WBC 9.5 04/30/2014   HGB 11.9* 04/30/2014   HCT 36.5* 04/30/2014   MCV 95.8 04/30/2014   PLT 322.0 04/30/2014    Insomnia: controlled with  Ambien 5 mg  He is able to sleep at night with his CPAP   Edema: Not present at this time, also not taking any Lasix  He has had lower urinary tract problems and BPH, currently taking Proscar and Detrol but his  insurance wants him to take oxybutynin. Prescriptions are being done by his urologist  HYPERLIPIDEMIA:         The lipid abnormality consists of  minimally elevated LDL, taking simvastatin for cardiovascular protection. Has good HDL levels and good overall control  Lab Results  Component Value Date   CHOL 144 04/30/2014   HDL 37.70* 04/30/2014   LDLCALC 92 04/30/2014   TRIG 74.0 04/30/2014   CHOLHDL 4 04/30/2014     Diabetic foot exam results in 7/15: nearly normal monofilament sensation in the toes and plantar surfaces, no skin lesions or ulcers on the feet and normal pedal pulses   Examination:   BP 140/80 mmHg  Pulse 104  Temp(Src) 98 F (36.7 C)  Resp 16  Ht 5' 10.5" (1.791 m)  Wt 247 lb 6.4 oz (112.22 kg)  BMI 34.98 kg/m2  SpO2 96%  Body mass index is 34.98 kg/(m^2).   Repeat blood pressure 126/74    No edema of feet, skin normal   Assesment/PLAN:   1. Diabetes type 2  The patient's diabetes control is much improved with stopping prednisone His insulin requirement is decreasing although he is still requiring relatively large amounts of Lantus As discussed in history of present illness he has reduced his Humalog but is still not checking readings after meals to help assess the dose Unable to review his blood sugar download today although he reports fairly consistent  readings throughout the day Discussed that since he is getting some overnight hypoglycemia needs to reduce his Lantus Also advised him on reducing his mealtime insulin about 50% when he is planning to be very active after a meal Needs to be more consistent with diet and reduce calories because of his tendency to weight gain now A1c is good at 6.5  2.  Renal insufficiency: His creatinine is  stable and also followed by nephrology;  etiology of his renal function is unclear He is unable to take diltiazem because of reported itching from this, previously unable to take ARB drugs because of hyperkalemia He  does have some acidosis and will defer management to nephrologist  3.  Hypertension: Blood pressure is normal without any medication and will continue to observe  4.  History of mild anemia, stable.  This is related to chronic disease  Counseling time over 50% of today's 25 minute visit   Ryott Rafferty  05/05/2014, 4:10 PM

## 2014-05-17 ENCOUNTER — Other Ambulatory Visit: Payer: Self-pay | Admitting: Internal Medicine

## 2014-05-21 ENCOUNTER — Other Ambulatory Visit: Payer: Self-pay | Admitting: *Deleted

## 2014-05-21 MED ORDER — INSULIN PEN NEEDLE 31G X 5 MM MISC: Status: DC

## 2014-05-24 ENCOUNTER — Other Ambulatory Visit: Payer: Self-pay | Admitting: Internal Medicine

## 2014-05-25 ENCOUNTER — Telehealth: Payer: Self-pay | Admitting: Endocrinology

## 2014-05-25 ENCOUNTER — Other Ambulatory Visit: Payer: Self-pay | Admitting: *Deleted

## 2014-05-25 MED ORDER — INSULIN PEN NEEDLE 31G X 5 MM MISC: Status: DC

## 2014-05-25 NOTE — Telephone Encounter (Signed)
Verbal order given  

## 2014-05-25 NOTE — Telephone Encounter (Signed)
Dr. Cruzita Lederer,  Can you please advise on this?

## 2014-05-25 NOTE — Telephone Encounter (Signed)
Pt has not had a bowel movement in 8 days Pt had a high blood sugar today at 2 pm of over 400. Pt has lost 3 lbs as of yesterday but his abdomen is swollen.  Stacy at Dr. Zenda Alpers told him to call us because of the high blood sugar  The pt is taking metamucil

## 2014-05-26 ENCOUNTER — Telehealth: Payer: Self-pay | Admitting: Endocrinology

## 2014-05-26 ENCOUNTER — Telehealth: Payer: Self-pay | Admitting: *Deleted

## 2014-05-26 NOTE — Telephone Encounter (Signed)
noted 

## 2014-05-26 NOTE — Telephone Encounter (Signed)
Patient went to the walk in clinic last night.  He said he has a kidney infection and some fluids in his lungs. He's on antibiotics now.   He said at 11 pm last night his sugar was 440, he took 28 units of humalog At 1:30 am it was 402. At 8:15 this morning it was 344 before breakfast.

## 2014-05-26 NOTE — Telephone Encounter (Signed)
Patient would like for Suanne Marker to return his call    Thank you

## 2014-05-26 NOTE — Telephone Encounter (Signed)
Lantus 38 bid and extra 15-30 Humalog at mealtimes when blood sugar is high, patient instructed

## 2014-05-28 ENCOUNTER — Other Ambulatory Visit: Payer: Self-pay | Admitting: *Deleted

## 2014-05-28 MED ORDER — "INSULIN SYRINGE 31G X 5/16"" 0.3 ML MISC": Status: DC

## 2014-06-02 ENCOUNTER — Other Ambulatory Visit: Payer: Self-pay | Admitting: Endocrinology

## 2014-06-03 ENCOUNTER — Other Ambulatory Visit: Payer: Self-pay | Admitting: Endocrinology

## 2014-07-01 ENCOUNTER — Other Ambulatory Visit: Payer: Self-pay | Admitting: *Deleted

## 2014-07-01 ENCOUNTER — Telehealth: Payer: Self-pay | Admitting: Endocrinology

## 2014-07-01 MED ORDER — FINASTERIDE 5 MG PO TABS: 5.0000 mg | ORAL_TABLET | Freq: Every day | ORAL | Status: DC

## 2014-07-01 MED ORDER — SIMVASTATIN 20 MG PO TABS: 20.0000 mg | ORAL_TABLET | Freq: Every day | ORAL | Status: DC

## 2014-07-01 NOTE — Telephone Encounter (Signed)
Patient would like a refill sent to [facility] for the following medications:  Simvastatin 20 mg Finasteride 5 mg   Thank you

## 2014-07-01 NOTE — Telephone Encounter (Signed)
rx sent

## 2014-07-02 ENCOUNTER — Other Ambulatory Visit (INDEPENDENT_AMBULATORY_CARE_PROVIDER_SITE_OTHER): Payer: Medicare PPO

## 2014-07-02 DIAGNOSIS — E1165 Type 2 diabetes mellitus with hyperglycemia: Secondary | ICD-10-CM

## 2014-07-02 DIAGNOSIS — E1129 Type 2 diabetes mellitus with other diabetic kidney complication: Secondary | ICD-10-CM

## 2014-07-02 DIAGNOSIS — IMO0002 Reserved for concepts with insufficient information to code with codable children: Secondary | ICD-10-CM

## 2014-07-02 LAB — COMPREHENSIVE METABOLIC PANEL
ALBUMIN: 3.9 g/dL (ref 3.5–5.2)
ALT: 19 U/L (ref 0–53)
AST: 24 U/L (ref 0–37)
Alkaline Phosphatase: 98 U/L (ref 39–117)
BUN: 43 mg/dL — ABNORMAL HIGH (ref 6–23)
CHLORIDE: 102 meq/L (ref 96–112)
CO2: 26 mEq/L (ref 19–32)
CREATININE: 2.09 mg/dL — AB (ref 0.40–1.50)
Calcium: 9.8 mg/dL (ref 8.4–10.5)
GFR: 32.28 mL/min — AB (ref >60.00)
GLUCOSE: 311 mg/dL — AB (ref 70–99)
Potassium: 5.1 mEq/L (ref 3.5–5.1)
Sodium: 136 mEq/L (ref 135–145)
Total Bilirubin: 0.3 mg/dL (ref 0.2–1.2)
Total Protein: 7.8 g/dL (ref 6.0–8.3)

## 2014-07-06 ENCOUNTER — Encounter: Payer: Self-pay | Admitting: Endocrinology

## 2014-07-06 ENCOUNTER — Ambulatory Visit (INDEPENDENT_AMBULATORY_CARE_PROVIDER_SITE_OTHER): Payer: Medicare PPO | Admitting: Endocrinology

## 2014-07-06 VITALS — BP 122/62 | HR 117 | Temp 98.3°F | Resp 16 | Ht 70.5 in | Wt 236.4 lb

## 2014-07-06 DIAGNOSIS — E1121 Type 2 diabetes mellitus with diabetic nephropathy: Secondary | ICD-10-CM

## 2014-07-06 DIAGNOSIS — N184 Chronic kidney disease, stage 4 (severe): Secondary | ICD-10-CM

## 2014-07-06 NOTE — Patient Instructions (Addendum)
Take Lasix every 2 days  May take Humalog after meal if needed

## 2014-07-06 NOTE — Progress Notes (Signed)
Patient ID: SAVON BORDONARO, male   DOB: 13-May-1928, 81 y.o.   MRN: [id number]   Reason for Appointment: Followup of various issues  History of Present Illness   Type 2 DIABETES MELITUS   He has had long-standing diabetes and has been on basal bolus insulin for the last few years Since 2014 he has required larger doses of insulin especially with having to take periodic courses of steroids for his pulmonary problems Also his Actos was stopped because of tendency to edema and metformin stopped because of renal dysfunction  He has been taking prednisone on and off with variable blood sugar control Generally requires much larger doses of insulin with prednisone, both basal and bolus He is now finishing up 6 day course of prednisone and his blood sugars had gone up again to as much as 339 However this morning blood sugar is back to 186 Generally compliant with checking his blood sugar at least 3 times a day on average Current blood sugar patterns: Blood sugars are variable at all times although prior to starting prednisone a week ago he was having mostly good blood sugars with sporadic readings around 200 or more in the afternoon or evening He does try to take his insulin right before eating and sometimes may not eat as much or more than he was planning; he tries to adjust insulin based on his carbohydrate intake somewhat and also to the pre-meal blood sugar Hypoglycemia: He thinks he has had occasional low blood sugars and these appear to be mostly after evening meal and once at 2 AM   Oral hypoglycemic drugs: None     Insulin regimen: Humalog 16-20 units before meals, Lantus recently 28 units a.m. --28 units p.m.       Monitors blood glucose:  2.9 times a day   Glucometer: Accu-Chek        Glucose  monitor download shows overall average blood sugar 163 recently with a range of 47-339 Highest blood sugar on average is 183 before supper; fasting average is 157     Meals: 3 meals per day.   breakfast: oatmeal, grits, some cheese at 6 am        Physical activity: exercise:  raking leaves, walking 1 mile, 4-5/7 days a week, previously was riding his bike  Weight history:  Wt Readings from Last 3 Encounters:  07/06/14 236 lb 6.4 oz (107.23 kg)  05/05/14 247 lb 6.4 oz (112.22 kg)  02/03/14 242 lb 3.2 oz (109.861 kg)               Complications are: Nephropathy. Microalbumin level now nearly normal  LABS:  Lab Results  Component Value Date   HGBA1C 6.5 04/30/2014   HGBA1C 7.0* 01/01/2014   HGBA1C 7.4* 07/15/2013   Lab Results  Component Value Date   MICROALBUR 32.4* 09/14/2013   LDLCALC 92 04/30/2014   CREATININE 2.09* 07/02/2014     Problem 2:  RENAL insufficiency: He has had chronic increase in creatinine, was 1.7 in 2014. This has been related to nephropathy and glomerulosclerosis However his level was increased to 2.6 in 06/29/13  Nephrology consultation done, no specific diagnosis made; ACE inhibitor/ARB recommended but have discussed with nephrologist the history of patient getting hyperkalemia with these drugs in the past Renal ultrasound did not show any abnormality Also appears to have mild acidosis but this is better now Creatinine is stable now  He has had a very mild increase in PTH of 95, apparently not on any  active vitamin D  Lab Results  Component Value Date   CREATININE 2.09* 07/02/2014   Proteinuria: He was given diltiazem  For his proteinuria  when blood pressure was relatively high  but he claims that it was causing itching on his abdominal area and he stopped it      Medication List       This list is accurate as of: 07/06/14  8:08 AM.  Always use your most recent med list.               ACCU-CHEK AVIVA PLUS W/DEVICE Kit  Use to check blood sugars 4 times per day dx code 250.00     ACCU-CHEK AVIVA Soln  Use as directed     ACCU-CHEK SOFTCLIX LANCETS lancets  TEST AS INSTRUCTED TO OBTAIN A BLOOD SPECIMEN 4 TIMES PER DAY      Alcohol Wipes 70 % Pads  Use 7 pads per day     budesonide-formoterol 160-4.5 MCG/ACT inhaler  Commonly known as:  SYMBICORT  Inhale 2 puffs into the lungs 2 (two) times daily.     SYMBICORT 160-4.5 MCG/ACT inhaler  Generic drug:  budesonide-formoterol     chlorpheniramine 4 MG tablet  Commonly known as:  CHLOR-TRIMETON  2 tablets by mouth at bedtime. 1 extra in am daily as needed.     dextromethorphan-guaiFENesin 30-600 MG per 12 hr tablet  Commonly known as:  MUCINEX DM  1-2 every 12 hours as needed for cough (with flutter valve)     famotidine 20 MG tablet  Commonly known as:  PEPCID  TAKE 1 TABLET BY MOUTH EVERY NIGHT AT BEDTIME     Ferrous Sulfate 27 MG Tabs  Take 1 tablet by mouth daily.     FIBER PO  1 every am and 1 at bedtime     finasteride 5 MG tablet  Commonly known as:  PROSCAR  Take 1 tablet (5 mg total) by mouth daily.     FLUTTER Devi  Use as directed.     furosemide 20 MG tablet  Commonly known as:  LASIX     glucose blood test strip  Commonly known as:  ACCU-CHEK AVIVA PLUS  USE AS INSTRUCTED TO CHECK BLOOD SUGAR FOUR TIMES DAILY dx code 250.02     insulin lispro 100 UNIT/ML injection  Commonly known as:  HUMALOG  Inject 0.21 mLs (21 Units total) into the skin 3 (three) times daily with meals. Sliding scale with each meal     Insulin Pen Needle 31G X 5 MM Misc  Use 2 per day     INSULIN SYRINGE .3CC/31GX5/16" 31G X 5/16" 0.3 ML Misc  Use 3 per day     LANTUS 100 UNIT/ML injection  Generic drug:  insulin glargine  INJECT 24 UNITS SUBCUTANEOUSLY IN THE MORNING AND 20 UNITS IN THE EVENING     montelukast 10 MG tablet  Commonly known as:  SINGULAIR  TAKE 1 TABLET BY MOUTH EVERY NIGHT AT BEDTIME     multivitamin with minerals Tabs tablet  Take 1 tablet by mouth daily.     pantoprazole 40 MG tablet  Commonly known as:  PROTONIX  TAKE 1 TABLET BY MOUTH EVERY DAY 30 TO 60 MINUTES PRIOR TO FIRST MEAL OF THE DAY     predniSONE 10 MG tablet   Commonly known as:  DELTASONE     PROAIR HFA 108 (90 BASE) MCG/ACT inhaler  Generic drug:  albuterol     simvastatin 20 MG tablet  Commonly known as:  ZOCOR  Take 1 tablet (20 mg total) by mouth daily.     tolterodine 4 MG 24 hr capsule  Commonly known as:  DETROL LA  Take 4 mg by mouth daily.     Vitamin D3 2000 UNITS Tabs  Take 1 capsule by mouth every morning.     zolpidem 5 MG tablet  Commonly known as:  AMBIEN  Take 1 tablet (5 mg total) by mouth at bedtime as needed for sleep.     zolpidem 6.25 MG CR tablet  Commonly known as:  AMBIEN CR        Allergies:  Allergies  Allergen Reactions  . Penicillins   . Diltiazem Rash    Past Medical History  Diagnosis Date  . Diabetes mellitus   . Hypertension   . Anemia   . Chronic renal insufficiency   . Osteoporosis   . Neuromuscular disorder   . DDD (degenerative disc disease), lumbar     Past Surgical History  Procedure Laterality Date  . Vasectomy    . Eye surgery    . Video bronchoscopy Bilateral 08/25/2012    Procedure: VIDEO BRONCHOSCOPY WITHOUT FLUORO;  Surgeon: Brand Males, MD;  Location: [facility];  Service: Cardiopulmonary;  Laterality: Bilateral;    Family History  Problem Relation Age of Onset  . Diabetes Mellitus II    . Hypertension      Social History:  reports that he quit smoking about 41 years ago. His smoking use included Cigarettes. He has a 99 pack-year smoking history. He quit smokeless tobacco use about 38 years ago. He reports that he does not drink alcohol or use illicit drugs.  Review of Systems -   HYPERTENSION: This has been mild in the past.  He was given diltiazem when blood pressure was relatively high on his last visit but he claims that it was causing itching on his abdominal area and he stopped it  Previous history of anemia related to renal dysfunction, recent hemoglobin is  Stable  Lab Results  Component Value Date   WBC 9.5 04/30/2014   HGB 11.9* 04/30/2014    HCT 36.5* 04/30/2014   MCV 95.8 04/30/2014   PLT 322.0 04/30/2014    Insomnia: controlled with  Ambien CR 5 mg.  Asking about taking Regular Ambien  twice in the night  He is trying to get used to his CPAP and this is being adjusted by pulmonologist.  He has been tried on C Pap during the day to let him get used to it  Edema: Not present at this time, also still taking 20 mg Lasix daily  He has had lower urinary tract problems and BPH, currently taking Proscar and Detrol but his insurance wants him to take oxybutynin. Prescriptions are being done by his urologist  HYPERLIPIDEMIA:         The lipid abnormality consists of  minimally elevated LDL, taking simvastatin for cardiovascular protection. Has good HDL levels and good overall control  Lab Results  Component Value Date   CHOL 144 04/30/2014   HDL 37.70* 04/30/2014   LDLCALC 92 04/30/2014   TRIG 74.0 04/30/2014   CHOLHDL 4 04/30/2014     Diabetic foot exam results in 7/15: nearly normal monofilament sensation in the toes and plantar surfaces, no skin lesions or ulcers on the feet and normal pedal pulses   Examination:   BP 134/72 mmHg  Pulse 117  Temp(Src) 98.3 F (36.8 C)  Resp 16  Ht  5' 10.5" (1.791 m)  Wt 236 lb 6.4 oz (107.23 kg)  BMI 33.43 kg/m2  SpO2 97%  Body mass index is 33.43 kg/(m^2).   Repeat blood pressure 122/62 standing  No edema of feet   Assesment/PLAN:   1. Diabetes type 2  The patient's diabetes control is overall inconsistent and usually difficult to control when taking prednisone Otherwise considering his age and duration of diabetes his blood sugars are reasonably controlled Again recently blood sugars are higher with a course of prednisone He has been adjusting his Lantus and Humalog based on his blood sugar levels and since his prednisone is being tapered he is getting better readings today He has occasional difficulty assessing how much carbohydrates he is getting his meal before he  ate and may get too much or too little Humalog Although he has lost weight he has been on diuretics since his last visit A1c in November was good at 6.5 Have recommended continuing his insulin regimen as before and call if not well controlled If not sure how much he is going to be taken take his Humalog postprandially  2.  Renal insufficiency: His creatinine is  stable and also followed by nephrology;  etiology of his renal function is unclear He is unable to take diltiazem because of reported itching from this, previously unable to take ARB drugs because of hyperkalemia He does have some orthostasis and can reduce his Lasix to every other day  3.  Hypertension: Blood pressure is normal without any medication and will continue to observe, has mild decrease in blood pressure standing up today  4.  History of mild anemia, will need follow-up periodically.  This is related to chronic disease   Madolin Twaddle  07/06/2014, 8:08 AM

## 2014-08-02 ENCOUNTER — Other Ambulatory Visit: Payer: Self-pay | Admitting: *Deleted

## 2014-08-02 MED ORDER — ACCU-CHEK AVIVA PLUS W/DEVICE KIT: PACK | Status: DC

## 2014-08-03 ENCOUNTER — Other Ambulatory Visit: Payer: Self-pay | Admitting: *Deleted

## 2014-08-03 MED ORDER — ACCU-CHEK AVIVA PLUS W/DEVICE KIT: PACK | Status: DC

## 2014-08-19 ENCOUNTER — Encounter: Payer: Self-pay | Admitting: *Deleted

## 2014-08-19 DIAGNOSIS — E11339 Type 2 diabetes mellitus with moderate nonproliferative diabetic retinopathy without macular edema: Secondary | ICD-10-CM | POA: Diagnosis not present

## 2014-08-19 DIAGNOSIS — H35373 Puckering of macula, bilateral: Secondary | ICD-10-CM | POA: Diagnosis not present

## 2014-08-19 LAB — HM DIABETES EYE EXAM

## 2014-09-23 ENCOUNTER — Telehealth: Payer: Self-pay | Admitting: Endocrinology

## 2014-09-23 NOTE — Telephone Encounter (Signed)
He should see a podiatrist but meanwhile use a shoe insert

## 2014-09-23 NOTE — Telephone Encounter (Signed)
Left message on machine for patient to return our call 

## 2014-09-23 NOTE — Telephone Encounter (Signed)
Patient is aware 

## 2014-09-23 NOTE — Telephone Encounter (Signed)
Pt called and is having trouble walking on right heel  Pt wants to know what he can take for relief please avise

## 2014-10-01 ENCOUNTER — Other Ambulatory Visit: Payer: Self-pay | Admitting: *Deleted

## 2014-10-01 ENCOUNTER — Other Ambulatory Visit (INDEPENDENT_AMBULATORY_CARE_PROVIDER_SITE_OTHER): Payer: Medicare PPO

## 2014-10-01 DIAGNOSIS — E1121 Type 2 diabetes mellitus with diabetic nephropathy: Secondary | ICD-10-CM | POA: Diagnosis not present

## 2014-10-01 LAB — URINALYSIS, ROUTINE W REFLEX MICROSCOPIC
Bilirubin Urine: NEGATIVE
Hgb urine dipstick: NEGATIVE
Ketones, ur: NEGATIVE
Leukocytes, UA: NEGATIVE
Nitrite: NEGATIVE
RBC / HPF: NONE SEEN (ref 0–?)
SPECIFIC GRAVITY, URINE: 1.015 (ref 1.000–1.030)
URINE GLUCOSE: NEGATIVE
UROBILINOGEN UA: 0.2 (ref 0.0–1.0)
WBC UA: NONE SEEN (ref 0–?)
pH: 5.5 (ref 5.0–8.0)

## 2014-10-01 LAB — COMPREHENSIVE METABOLIC PANEL
ALT: 30 U/L (ref 0–53)
AST: 31 U/L (ref 0–37)
Albumin: 4 g/dL (ref 3.5–5.2)
Alkaline Phosphatase: 90 U/L (ref 39–117)
BILIRUBIN TOTAL: 0.3 mg/dL (ref 0.2–1.2)
BUN: 47 mg/dL — ABNORMAL HIGH (ref 6–23)
CHLORIDE: 105 meq/L (ref 96–112)
CO2: 26 meq/L (ref 19–32)
Calcium: 9.5 mg/dL (ref 8.4–10.5)
Creatinine, Ser: 2.24 mg/dL — ABNORMAL HIGH (ref 0.40–1.50)
GFR: 29.78 mL/min — AB (ref >60.00)
Glucose, Bld: 96 mg/dL (ref 70–99)
Potassium: 4.5 mEq/L (ref 3.5–5.1)
Sodium: 138 mEq/L (ref 135–145)
TOTAL PROTEIN: 7.3 g/dL (ref 6.0–8.3)

## 2014-10-01 LAB — HEMOGLOBIN A1C: Hgb A1c MFr Bld: 6.8 % — ABNORMAL HIGH (ref 4.6–6.5)

## 2014-10-01 LAB — MICROALBUMIN / CREATININE URINE RATIO
Creatinine,U: 75.8 mg/dL
MICROALB UR: 17 mg/dL — AB (ref 0.0–1.9)
Microalb Creat Ratio: 22.4 mg/g (ref 0.0–30.0)

## 2014-10-01 MED ORDER — "INSULIN SYRINGE 31G X 5/16"" 0.3 ML MISC": Status: DC

## 2014-10-01 MED ORDER — INSULIN GLARGINE 100 UNIT/ML ~~LOC~~ SOLN: SUBCUTANEOUS | Status: DC

## 2014-10-01 MED ORDER — INSULIN LISPRO 100 UNIT/ML ~~LOC~~ SOLN: 21.0000 [IU] | Freq: Three times a day (TID) | SUBCUTANEOUS | Status: DC

## 2014-10-01 MED ORDER — FINASTERIDE 5 MG PO TABS: 5.0000 mg | ORAL_TABLET | Freq: Every day | ORAL | Status: DC

## 2014-10-01 MED ORDER — ACCU-CHEK SOFTCLIX LANCETS MISC: Status: DC

## 2014-10-05 ENCOUNTER — Encounter: Payer: Self-pay | Admitting: Endocrinology

## 2014-10-05 ENCOUNTER — Ambulatory Visit (INDEPENDENT_AMBULATORY_CARE_PROVIDER_SITE_OTHER): Payer: Medicare PPO | Admitting: Endocrinology

## 2014-10-05 VITALS — BP 122/62 | HR 85 | Temp 98.1°F | Resp 16 | Ht 70.5 in | Wt 239.6 lb

## 2014-10-05 DIAGNOSIS — N183 Chronic kidney disease, stage 3 unspecified: Secondary | ICD-10-CM

## 2014-10-05 DIAGNOSIS — E1121 Type 2 diabetes mellitus with diabetic nephropathy: Secondary | ICD-10-CM

## 2014-10-05 DIAGNOSIS — M25473 Effusion, unspecified ankle: Secondary | ICD-10-CM

## 2014-10-05 DIAGNOSIS — R609 Edema, unspecified: Secondary | ICD-10-CM

## 2014-10-05 NOTE — Patient Instructions (Addendum)
Lantus 22 units in am and 25 in pm  Check more sugars at bedtime  Humalog 14 at Bfst and lunch and less if very active

## 2014-10-05 NOTE — Progress Notes (Signed)
Patient ID: Clayton [name], male   DOB: 11-Sep-1927, 81 y.o.   MRN: [id number]   Reason for Appointment: Followup of various issues  History of Present Illness   Type 2 DIABETES MELITUS   He has had long-standing diabetes and has been on basal bolus insulin for the last few years Since 2014 he has required larger doses of insulin especially with having to take periodic courses of steroids for his pulmonary problems Also his Actos was stopped because of tendency to edema and metformin stopped because of renal dysfunction  Insulin regimen: Humalog 16-20 units before meals, Lantus recently 25 twice a day.  He had been taking prednisone on and off with variable blood sugar control; now off prednisone for some time Generally requires much larger doses of insulin with prednisone, both basal and bolus but is still taking significant amounts of insulin A1c is still under 7% Generally compliant with checking his blood sugar at least 3 times a day on average   Current blood sugar patterns:  His fasting blood sugars are mostly near normal with a sporadic increase up to about 150    blood sugars are recently relatively lower at lunchtime and also suppertime , lowest blood sugar 48 at lunch.  He thinks blood sugars are lower when he is more active with yard work.  Blood sugars may be more variable around supper time based on his mealtime intake   Has done only a few readings after supper and these are sporadically over 200.  He thinks this is when he is eating larger amounts of carbohydrates.  He is getting his meals prepared by his daughter  He does try to take his insulin right before eating and sometimes may not eat as much or more than he was planning; he tries to adjust insulin based on his carbohydrate intake somewhat.  Last evening he did not eat as much as he had planned when he went out with not clear why his sugar was low at 3 AM Hypoglycemia: He  Has had sporadic low blood  sugars although less recently except last night   tendency to low sugars occurred before lunch or supper    Oral hypoglycemic drugs: None           Monitors blood glucose:  3-4 times a day   Glucometer: Accu-Chek        Glucose  monitor download shows  The following patterns:   PRE-MEAL Breakfast Lunch Dinner Bedtime Overall  Glucose range:  74- 159  48- 295  54- 181  121- 270   Mean/median:  120  105  117  184 126     Meals: 3 meals per day.  breakfast: oatmeal + grits, some cheese at 6 am        Physical activity: exercise: mostly outside yardwork , little walking  Weight history:  Wt Readings from Last 3 Encounters:  10/05/14 239 lb 9.6 oz (108.682 kg)  07/06/14 236 lb 6.4 oz (107.23 kg)  05/05/14 247 lb 6.4 oz (112.22 kg)               Complications are: Nephropathy. Microalbumin level now nearly normal  LABS:  Lab Results  Component Value Date   HGBA1C 6.8* 10/01/2014   HGBA1C 6.5 04/30/2014   HGBA1C 7.0* 01/01/2014   Lab Results  Component Value Date   MICROALBUR 17.0* 10/01/2014   LDLCALC 92 04/30/2014   CREATININE 2.24* 10/01/2014     Problem 2:  RENAL  insufficiency: He has had chronic increase in creatinine, was 1.7 in 2014. This has been related to nephropathy and glomerulosclerosis Previously his level was increased to 2.6 in 06/29/13  Nephrology consultation done, no specific diagnosis made; ACE inhibitor/ARB recommended but have discussed with nephrologist the history of patient getting hyperkalemia with these drugs in the past Renal ultrasound did not show any abnormality Also appears to have mild acidosis but this is better now Creatinine is slightly higher.   He has had a very mild increase in PTH of 95, but more recently was 57  Lab Results  Component Value Date   CREATININE 2.24* 10/01/2014   Proteinuria: He was given diltiazem previously For his proteinuria  when blood pressure was relatively high  but he claims that it was causing itching  on his abdominal area and he stopped it  Microalbumin/creatinine ratio is normal at 17     Medication List       This list is accurate as of: 10/05/14  8:04 AM.  Always use your most recent med list.               ACCU-CHEK AVIVA PLUS W/DEVICE Kit  Use to check blood sugars 4 times per day. Dx code: E11.9     ACCU-CHEK AVIVA Soln  Use as directed     ACCU-CHEK SOFTCLIX LANCETS lancets  TEST AS INSTRUCTED TO OBTAIN A BLOOD SPECIMEN 4 TIMES PER DAY     Alcohol Wipes 70 % Pads  Use 7 pads per day     budesonide-formoterol 160-4.5 MCG/ACT inhaler  Commonly known as:  SYMBICORT  Inhale 2 puffs into the lungs 2 (two) times daily.     SYMBICORT 160-4.5 MCG/ACT inhaler  Generic drug:  budesonide-formoterol     chlorpheniramine 4 MG tablet  Commonly known as:  CHLOR-TRIMETON  2 tablets by mouth at bedtime. 1 extra in am daily as needed.     dextromethorphan-guaiFENesin 30-600 MG per 12 hr tablet  Commonly known as:  MUCINEX DM  1-2 every 12 hours as needed for cough (with flutter valve)     Ferrous Sulfate 27 MG Tabs  Take 1 tablet by mouth daily.     FIBER PO  1 every am and 1 at bedtime     finasteride 5 MG tablet  Commonly known as:  PROSCAR  Take 1 tablet (5 mg total) by mouth daily.     FLUTTER Devi  Use as directed.     furosemide 20 MG tablet  Commonly known as:  LASIX     glucose blood test strip  Commonly known as:  ACCU-CHEK AVIVA PLUS  USE AS INSTRUCTED TO CHECK BLOOD SUGAR FOUR TIMES DAILY dx code 250.02     insulin glargine 100 UNIT/ML injection  Commonly known as:  LANTUS  INJECT 24 UNITS SUBCUTANEOUSLY IN THE MORNING AND 20 UNITS IN THE EVENING     insulin lispro 100 UNIT/ML injection  Commonly known as:  HUMALOG  Inject 0.21 mLs (21 Units total) into the skin 3 (three) times daily with meals. Sliding scale with each meal     INSULIN SYRINGE .3CC/31GX5/16" 31G X 5/16" 0.3 ML Misc  Use 3 per day     montelukast 10 MG tablet  Commonly known  as:  SINGULAIR  TAKE 1 TABLET BY MOUTH EVERY NIGHT AT BEDTIME     multivitamin with minerals Tabs tablet  Take 1 tablet by mouth daily.     pantoprazole 40 MG tablet  Commonly known  as:  PROTONIX  TAKE 1 TABLET BY MOUTH EVERY DAY 30 TO 60 MINUTES PRIOR TO FIRST MEAL OF THE DAY     predniSONE 10 MG tablet  Commonly known as:  DELTASONE     PROAIR HFA 108 (90 BASE) MCG/ACT inhaler  Generic drug:  albuterol     simvastatin 20 MG tablet  Commonly known as:  ZOCOR  Take 1 tablet (20 mg total) by mouth daily.     tolterodine 4 MG 24 hr capsule  Commonly known as:  DETROL LA  Take 4 mg by mouth daily.     Vitamin D3 2000 UNITS Tabs  Take 1 capsule by mouth every morning.     zolpidem 5 MG tablet  Commonly known as:  AMBIEN  Take 1 tablet (5 mg total) by mouth at bedtime as needed for sleep.     zolpidem 6.25 MG CR tablet  Commonly known as:  AMBIEN CR        Allergies:  Allergies  Allergen Reactions  . Penicillins   . Diltiazem Rash    Past Medical History  Diagnosis Date  . Diabetes mellitus   . Hypertension   . Anemia   . Chronic renal insufficiency   . Osteoporosis   . Neuromuscular disorder   . DDD (degenerative disc disease), lumbar     Past Surgical History  Procedure Laterality Date  . Vasectomy    . Eye surgery    . Video bronchoscopy Bilateral 08/25/2012    Procedure: VIDEO BRONCHOSCOPY WITHOUT FLUORO;  Surgeon: Brand Males, MD;  Location: [place] [facility];  Service: Cardiopulmonary;  Laterality: Bilateral;    Family History  Problem Relation Age of Onset  . Diabetes Mellitus II    . Hypertension      Social History:  reports that he quit smoking about 41 years ago. His smoking use included Cigarettes. He has a 99 pack-year smoking history. He quit smokeless tobacco use about 39 years ago. He reports that he does not drink alcohol or use illicit drugs.  Review of Systems -   HYPERTENSION: This has been mild in the past.  He was given  diltiazem when blood pressure was relatively high on his last visit but he claims that it was causing itching on his abdominal area and he stopped it  Previous history of anemia related to renal dysfunction, recent hemoglobin is  Stable  Lab Results  Component Value Date   WBC 9.5 04/30/2014   HGB 11.9* 04/30/2014   HCT 36.5* 04/30/2014   MCV 95.8 04/30/2014   PLT 322.0 04/30/2014    Insomnia: controlled with  Ambien CR 5 mg.  Asking about taking Regular Ambien  twice in the night  He is trying to get used to his CPAP and this is being adjusted by pulmonologist.  He has been tried on C Pap during the day to let him get used to it  Edema: Not present at this time, also still taking 20 mg Lasix daily  He has had lower urinary tract problems and BPH, currently taking Proscar and Detrol but his insurance wants him to take oxybutynin. Prescriptions are being done by his urologist  HYPERLIPIDEMIA:         The lipid abnormality consists of  minimally elevated LDL, taking simvastatin for cardiovascular protection. Has good HDL levels and good overall control  Lab Results  Component Value Date   CHOL 144 04/30/2014   HDL 37.70* 04/30/2014   LDLCALC 92 04/30/2014   TRIG  74.0 04/30/2014   CHOLHDL 4 04/30/2014     Diabetic foot exam results in 7/15: nearly normal monofilament sensation in the toes and plantar surfaces, no skin lesions or ulcers on the feet and normal pedal pulses   Examination:   Pulse 85  Temp(Src) 98.1 F (36.7 C)  Resp 16  Ht 5' 10.5" (1.791 m)  Wt 239 lb 9.6 oz (108.682 kg)  BMI 33.88 kg/m2  SpO2 95%  Body mass index is 33.88 kg/(m^2).   Repeat blood pressure  126/ 60 standing   1+ ankle edema  feet are normal to inspection   Assesment/PLAN:   1. Diabetes type 2  The patient's diabetes control is overall better since he went off prednisone    He is still however is taking relatively large doses of insulin including twice a day Lantus.  More recently  because of being more active outside he is tending to have lower blood sugars at lunch and supper especially at lunchtime when his blood sugars are consistently below 100 now  Discussed needing to reduce his mealtime coverage at breakfast and lunch especially when he is more active  Also as a precaution reduce his Lantus down to 22 units in the morning Discussed needing to adjust mealtime dose again based on his carbohydrate intake and planned activity. Minutes to check more blood sugars after supper however since he has more variability with his evening meal coverage  2.   Chronic kidney disease: His creatinine is slightly worse at 2.2 recently for unclear reasons   Also etiology of renal dysfunction is not established but may be at least partly from diabetic nephropathy, urine microalbumin is Surprisingly normal now Will defer any management changes To the nephrologist  3.  Hypertension: Blood pressure is normal without any medication   4.  History of mild anemia, will need follow-up periodically.  This is related to chronic kidney disease  Counseling time over 50% of today's 25 minute visit  Kruze Atchley  10/05/2014, 8:04 AM

## 2014-10-28 ENCOUNTER — Other Ambulatory Visit: Payer: Self-pay | Admitting: Endocrinology

## 2014-12-18 DIAGNOSIS — T07 Unspecified multiple injuries: Secondary | ICD-10-CM | POA: Diagnosis not present

## 2014-12-19 DIAGNOSIS — T07 Unspecified multiple injuries: Secondary | ICD-10-CM | POA: Diagnosis not present

## 2014-12-29 DIAGNOSIS — J449 Chronic obstructive pulmonary disease, unspecified: Secondary | ICD-10-CM | POA: Diagnosis not present

## 2014-12-29 DIAGNOSIS — K228 Other specified diseases of esophagus: Secondary | ICD-10-CM | POA: Diagnosis not present

## 2014-12-29 DIAGNOSIS — N62 Hypertrophy of breast: Secondary | ICD-10-CM | POA: Diagnosis not present

## 2014-12-29 DIAGNOSIS — R05 Cough: Secondary | ICD-10-CM | POA: Diagnosis not present

## 2014-12-29 DIAGNOSIS — G4733 Obstructive sleep apnea (adult) (pediatric): Secondary | ICD-10-CM | POA: Diagnosis not present

## 2014-12-29 DIAGNOSIS — I2510 Atherosclerotic heart disease of native coronary artery without angina pectoris: Secondary | ICD-10-CM | POA: Diagnosis not present

## 2014-12-29 DIAGNOSIS — M47814 Spondylosis without myelopathy or radiculopathy, thoracic region: Secondary | ICD-10-CM | POA: Diagnosis not present

## 2014-12-29 DIAGNOSIS — I700 Atherosclerosis of aorta: Secondary | ICD-10-CM | POA: Diagnosis not present

## 2014-12-29 DIAGNOSIS — R609 Edema, unspecified: Secondary | ICD-10-CM | POA: Diagnosis not present

## 2014-12-29 DIAGNOSIS — R918 Other nonspecific abnormal finding of lung field: Secondary | ICD-10-CM | POA: Diagnosis not present

## 2014-12-29 DIAGNOSIS — J432 Centrilobular emphysema: Secondary | ICD-10-CM | POA: Diagnosis not present

## 2014-12-31 ENCOUNTER — Other Ambulatory Visit: Payer: Medicare PPO

## 2015-01-04 ENCOUNTER — Ambulatory Visit: Payer: Medicare PPO | Admitting: Endocrinology

## 2015-01-11 DIAGNOSIS — R0602 Shortness of breath: Secondary | ICD-10-CM | POA: Diagnosis not present

## 2015-01-12 ENCOUNTER — Other Ambulatory Visit: Payer: Self-pay | Admitting: Endocrinology

## 2015-01-12 DIAGNOSIS — H903 Sensorineural hearing loss, bilateral: Secondary | ICD-10-CM | POA: Diagnosis not present

## 2015-01-19 ENCOUNTER — Other Ambulatory Visit (INDEPENDENT_AMBULATORY_CARE_PROVIDER_SITE_OTHER): Payer: Medicare PPO

## 2015-01-19 DIAGNOSIS — E11339 Type 2 diabetes mellitus with moderate nonproliferative diabetic retinopathy without macular edema: Secondary | ICD-10-CM | POA: Diagnosis not present

## 2015-01-19 DIAGNOSIS — N183 Chronic kidney disease, stage 3 unspecified: Secondary | ICD-10-CM

## 2015-01-19 DIAGNOSIS — Z961 Presence of intraocular lens: Secondary | ICD-10-CM | POA: Diagnosis not present

## 2015-01-19 DIAGNOSIS — H43813 Vitreous degeneration, bilateral: Secondary | ICD-10-CM | POA: Diagnosis not present

## 2015-01-19 DIAGNOSIS — H35373 Puckering of macula, bilateral: Secondary | ICD-10-CM | POA: Diagnosis not present

## 2015-01-19 DIAGNOSIS — E1121 Type 2 diabetes mellitus with diabetic nephropathy: Secondary | ICD-10-CM | POA: Diagnosis not present

## 2015-01-19 LAB — LIPID PANEL
Cholesterol: 119 mg/dL (ref 0–200)
HDL: 41.5 mg/dL (ref >39.00)
LDL CALC: 59 mg/dL (ref 0–99)
NONHDL: 77.07
Total CHOL/HDL Ratio: 3
Triglycerides: 90 mg/dL (ref 0.0–149.0)
VLDL: 18 mg/dL (ref 0.0–40.0)

## 2015-01-19 LAB — CBC
HEMATOCRIT: 34 % — AB (ref 39.0–52.0)
Hemoglobin: 11.4 g/dL — ABNORMAL LOW (ref 13.0–17.0)
MCHC: 33.4 g/dL (ref 30.0–36.0)
MCV: 95.3 fl (ref 78.0–100.0)
Platelets: 334 10*3/uL (ref 150.0–400.0)
RBC: 3.57 Mil/uL — AB (ref 4.22–5.81)
RDW: 13.4 % (ref 11.5–15.5)
WBC: 10.9 10*3/uL — AB (ref 4.0–10.5)

## 2015-01-19 LAB — HM DIABETES EYE EXAM

## 2015-01-19 LAB — COMPREHENSIVE METABOLIC PANEL
ALK PHOS: 94 U/L (ref 39–117)
ALT: 14 U/L (ref 0–53)
AST: 18 U/L (ref 0–37)
Albumin: 3.9 g/dL (ref 3.5–5.2)
BILIRUBIN TOTAL: 0.3 mg/dL (ref 0.2–1.2)
BUN: 52 mg/dL — ABNORMAL HIGH (ref 6–23)
CHLORIDE: 108 meq/L (ref 96–112)
CO2: 26 meq/L (ref 19–32)
CREATININE: 2.68 mg/dL — AB (ref 0.40–1.50)
Calcium: 9.3 mg/dL (ref 8.4–10.5)
GFR: 24.2 mL/min — AB (ref >60.00)
GLUCOSE: 132 mg/dL — AB (ref 70–99)
Potassium: 4.6 mEq/L (ref 3.5–5.1)
SODIUM: 142 meq/L (ref 135–145)
Total Protein: 7 g/dL (ref 6.0–8.3)

## 2015-01-19 LAB — HEMOGLOBIN A1C: Hgb A1c MFr Bld: 6.1 % (ref 4.6–6.5)

## 2015-01-20 ENCOUNTER — Encounter: Payer: Self-pay | Admitting: *Deleted

## 2015-01-21 ENCOUNTER — Encounter: Payer: Self-pay | Admitting: Endocrinology

## 2015-01-21 ENCOUNTER — Ambulatory Visit (INDEPENDENT_AMBULATORY_CARE_PROVIDER_SITE_OTHER): Payer: Medicare PPO | Admitting: Endocrinology

## 2015-01-21 VITALS — BP 122/60 | HR 91 | Temp 98.2°F | Resp 16 | Ht 70.5 in | Wt 237.8 lb

## 2015-01-21 DIAGNOSIS — D638 Anemia in other chronic diseases classified elsewhere: Secondary | ICD-10-CM | POA: Diagnosis not present

## 2015-01-21 DIAGNOSIS — R609 Edema, unspecified: Secondary | ICD-10-CM | POA: Diagnosis not present

## 2015-01-21 DIAGNOSIS — N184 Chronic kidney disease, stage 4 (severe): Secondary | ICD-10-CM | POA: Diagnosis not present

## 2015-01-21 DIAGNOSIS — M25473 Effusion, unspecified ankle: Secondary | ICD-10-CM

## 2015-01-21 DIAGNOSIS — E1121 Type 2 diabetes mellitus with diabetic nephropathy: Secondary | ICD-10-CM

## 2015-01-21 NOTE — Progress Notes (Signed)
Patient ID: Clayton [name], male   DOB: Nov 09, 1927, 81 y.o.   MRN: [id number]   Reason for Appointment: Followup of diabetes and various issues  History of Present Illness   Type 2 DIABETES MELITUS   He has had long-standing diabetes and has been on basal bolus insulin for the last few years Since 2014 he has required larger doses of insulin especially with having to take periodic courses of steroids for his pulmonary problems Also his Actos was stopped because of tendency to edema and metformin stopped because of renal dysfunction  Insulin regimen: Humalog 16 units before meals, Lantus  22  twice a day.  With his not needing prednisone for quite some time his blood sugars have been improved significantly Also requiring relatively less insulin, both basal and bolus A1c is consistently under 7% and now 6.1, not clear if this is affected by his renal dysfunction and anemia Generally compliant with checking his blood sugar at least 3 times a day on average   Current blood sugar patterns and problems identified:  His fasting blood sugars are mostly near normal without overnight hypoglycemia  Has somewhat more variable blood sugars at lunch and suppertime  Blood sugars maybe tending to be low normal at times at lunchtime recently and overall the lowest of the day recently with average only 94  Hypoglycemia: For about a week in the middle of July he was tending to have low blood sugars at various times but not recently; may have reduced his insulin some at that time  He does to have some relatively high blood sugars around 6-8 PM which he thinks her before supper, not clear if this is related to snacks.  However his blood sugars around supper time are still relatively good with an average of 132  He has not been able to be very active recently because of various issues  He is fairly consistent with taking his mealtime insulin but does not adjusted based on what he is  eating   Hypoglycemia: Not recently as above   Oral hypoglycemic drugs: None           Monitors blood glucose:  3-4 times a day   Glucometer: Accu-Chek        Glucose  monitor download shows  The following patterns:  Mean values apply above for all meters except median for One Touch  PRE-MEAL Fasting Lunch Dinner Bedtime Overall  Glucose range:  65-157   55-234   65-181   65-214    Mean/median:  109   109   132   131   114+/-37     Meals: 3 meals per day.  breakfast: oatmeal + grits, some cheese at 6 am        Physical activity: exercise: mostly outside yardwork , little walking  Weight history:  Wt Readings from Last 3 Encounters:  01/21/15 237 lb 12.8 oz (107.865 kg)  10/05/14 239 lb 9.6 oz (108.682 kg)  07/06/14 236 lb 6.4 oz (107.23 kg)               Complications are: Nephropathy. Microalbumin level now nearly normal  LABS:  Lab Results  Component Value Date   HGBA1C 6.1 01/19/2015   HGBA1C 6.8* 10/01/2014   HGBA1C 6.5 04/30/2014   Lab Results  Component Value Date   MICROALBUR 17.0* 10/01/2014   LDLCALC 59 01/19/2015   CREATININE 2.68* 01/19/2015     Problem 2:  RENAL insufficiency: He has had chronic  increase in creatinine, was 1.7 in 2014. This has been related to nephropathy and glomerulosclerosis Nephrology consultation done, no specific diagnosis made; ACE inhibitor/ARB recommended but not done because of the history of patient getting hyperkalemia with these drugs in the past Renal ultrasound did not show any abnormality  Creatinine is relatively highe now.   He has had a very mild increase in PTH of 95, but last level was 57  Lab Results  Component Value Date   CREATININE 2.68* 01/19/2015   Proteinuria: He was given diltiazem previously For his proteinuria  when blood pressure was relatively high  but he claims that it was causing itching on his abdominal area and he stopped it  Microalbumin/creatinine ratio was last normal at 17      Medication List       This list is accurate as of: 01/21/15 11:59 PM.  Always use your most recent med list.               ACCU-CHEK AVIVA PLUS W/DEVICE Kit  Use to check blood sugars 4 times per day. Dx code: E11.9     ACCU-CHEK AVIVA Soln  Use as directed     ACCU-CHEK SOFTCLIX LANCETS lancets  TEST AS INSTRUCTED TO OBTAIN A BLOOD SPECIMEN 4 TIMES PER DAY     Alcohol Wipes 70 % Pads  Use 7 pads per day     budesonide-formoterol 160-4.5 MCG/ACT inhaler  Commonly known as:  SYMBICORT  Inhale 2 puffs into the lungs 2 (two) times daily.     SYMBICORT 160-4.5 MCG/ACT inhaler  Generic drug:  budesonide-formoterol     chlorpheniramine 4 MG tablet  Commonly known as:  CHLOR-TRIMETON  2 tablets by mouth at bedtime. 1 extra in am daily as needed.     dextromethorphan-guaiFENesin 30-600 MG per 12 hr tablet  Commonly known as:  MUCINEX DM  1-2 every 12 hours as needed for cough (with flutter valve)     Ferrous Sulfate 27 MG Tabs  Take 1 tablet by mouth daily.     FIBER PO  1 every am and 1 at bedtime     finasteride 5 MG tablet  Commonly known as:  PROSCAR  TAKE 1 TABLET EVERY DAY     FLUTTER Devi  Use as directed.     furosemide 20 MG tablet  Commonly known as:  LASIX     glucose blood test strip  Commonly known as:  ACCU-CHEK AVIVA PLUS  USE AS INSTRUCTED TO CHECK BLOOD SUGAR FOUR TIMES DAILY dx code 250.02     insulin lispro 100 UNIT/ML injection  Commonly known as:  HUMALOG  Inject 0.21 mLs (21 Units total) into the skin 3 (three) times daily with meals. Sliding scale with each meal     INSULIN SYRINGE .3CC/31GX5/16" 31G X 5/16" 0.3 ML Misc  Use 3 per day     LANTUS 100 UNIT/ML injection  Generic drug:  insulin glargine  INJECT 24 UNITS SUBCUTANEOUSLY IN THE MORNING AND 20 UNITS IN THE EVENING     montelukast 10 MG tablet  Commonly known as:  SINGULAIR  TAKE 1 TABLET BY MOUTH EVERY NIGHT AT BEDTIME     multivitamin with minerals Tabs tablet  Take 1  tablet by mouth daily.     pantoprazole 40 MG tablet  Commonly known as:  PROTONIX  TAKE 1 TABLET BY MOUTH EVERY DAY 30 TO 60 MINUTES PRIOR TO FIRST MEAL OF THE DAY     PROAIR HFA 108 (  90 BASE) MCG/ACT inhaler  Generic drug:  albuterol     simvastatin 20 MG tablet  Commonly known as:  ZOCOR  TAKE 1 TABLET EVERY DAY     tolterodine 4 MG 24 hr capsule  Commonly known as:  DETROL LA  Take 4 mg by mouth daily.     Vitamin D3 2000 UNITS Tabs  Take 1 capsule by mouth every morning.     zolpidem 5 MG tablet  Commonly known as:  AMBIEN  Take 1 tablet (5 mg total) by mouth at bedtime as needed for sleep.     zolpidem 6.25 MG CR tablet  Commonly known as:  AMBIEN CR        Allergies:  Allergies  Allergen Reactions  . Penicillins   . Diltiazem Rash    Past Medical History  Diagnosis Date  . Diabetes mellitus   . Hypertension   . Anemia   . Chronic renal insufficiency   . Osteoporosis   . Neuromuscular disorder   . DDD (degenerative disc disease), lumbar     Past Surgical History  Procedure Laterality Date  . Vasectomy    . Eye surgery    . Video bronchoscopy Bilateral 08/25/2012    Procedure: VIDEO BRONCHOSCOPY WITHOUT FLUORO;  Surgeon: Brand Males, MD;  Location: [facility];  Service: Cardiopulmonary;  Laterality: Bilateral;    Family History  Problem Relation Age of Onset  . Diabetes Mellitus II    . Hypertension      Social History:  reports that he quit smoking about 41 years ago. His smoking use included Cigarettes. He has a 99 pack-year smoking history. He quit smokeless tobacco use about 39 years ago. He reports that he does not drink alcohol or use illicit drugs.  Review of Systems -   HYPERTENSION: This has been mild in the past.  Currently not on any medications  Previous history of anemia related to renal dysfunction, recent hemoglobin is slightly lower but still adequate  Lab Results  Component Value Date   WBC 10.9* 01/19/2015   HGB  11.4* 01/19/2015   HCT 34.0* 01/19/2015   MCV 95.3 01/19/2015   PLT 334.0 01/19/2015    Insomnia: controlled with  Ambien CR 5 mg.    He has been given CPAP and this is being adjusted by pulmonologist.    Edema: Mostly on the left leg at this time,  still taking 20 mg Lasix daily, sometimes with taking 40 mg his edema does not get it better  He has had lower urinary tract problems and BPH.  Prescriptions are being done by his urologist  HYPERLIPIDEMIA:         The lipid abnormality consists of  minimally elevated LDL, taking simvastatin for cardiovascular protection. Has good HDL levels and good overall control  Lab Results  Component Value Date   CHOL 119 01/19/2015   HDL 41.50 01/19/2015   LDLCALC 59 01/19/2015   TRIG 90.0 01/19/2015   CHOLHDL 3 01/19/2015     Diabetic foot exam results in 8/16: nearly normal monofilament sensation in the toes and plantar surfaces, no skin lesions or ulcers on the feet and normal pedal pulses   Examination:   BP 122/60 mmHg  Pulse 91  Temp(Src) 98.2 F (36.8 C)  Resp 16  Ht 5' 10.5" (1.791 m)  Wt 237 lb 12.8 oz (107.865 kg)  BMI 33.63 kg/m2  SpO2 97%  Body mass index is 33.63 kg/(m^2).   Repeat blood pressure  122/ 60  standing   1-2+ left ankle and left lower leg edema, none on the right  Diabetic foot exam shows normal monofilament sensation in the toes and minimally decreased on the plantar surfaces, no skin lesions or ulcers on the feet and normal pedal pulses   Assesment/PLAN:   1. Diabetes type 2  The patient's diabetes control is overall better since he has been off prednisone His A1c is down to 6.1 although this is be affected by his renal function and mild anemia Lowest blood sugars are around lunchtime recently although he has not had any hypoglycemia since about 2-3 weeks ago Discussed that as a precaution reduce his Lantus down to 20 units in the morning Also he does not need aggressive control at this stage of his  diabetes and his age  Discussed needing to adjust mealtime dose again based on his carbohydrate intake and planned activity. He can try to check more readings after her evening meal and not always before lunch or supper  2.   Chronic kidney disease: His creatinine is slightly worse at 2.6 recently for unclear reasons   Will have him follow-up in nephrologist again  3.  Hypertension: Blood pressure is normal without any medication   4.  History of mild anemia, will need follow-up periodically.  Will also need to recheck iron level.  This is related to chronic kidney disease  5.  Left leg edema is likely to be from venous insufficiency and given him prescription for elastic stockings.  Will not increase Lasix  Patient Instructions  Lantus 20 in am  Check blood sugars on waking up ..3  .. times a week Also check blood sugars about 2 hours after a meal and do this after different meals by rotation  Recommended blood sugar levels on waking up is 90-130 and about 2 hours after meal is 140-180 Please bring blood sugar monitor to each visit.    Counseling time on subjects discussed above is over 50% of today's 25 minute visit  Jashad Depaula  01/23/2015, 8:54 PM

## 2015-01-21 NOTE — Patient Instructions (Signed)
Lantus 20 in am  Check blood sugars on waking up ..3  .. times a week Also check blood sugars about 2 hours after a meal and do this after different meals by rotation  Recommended blood sugar levels on waking up is 90-130 and about 2 hours after meal is 140-180 Please bring blood sugar monitor to each visit.

## 2015-01-25 DIAGNOSIS — R1310 Dysphagia, unspecified: Secondary | ICD-10-CM | POA: Diagnosis not present

## 2015-01-28 DIAGNOSIS — R0602 Shortness of breath: Secondary | ICD-10-CM | POA: Diagnosis not present

## 2015-02-03 ENCOUNTER — Telehealth: Payer: Self-pay | Admitting: *Deleted

## 2015-02-03 ENCOUNTER — Telehealth: Payer: Self-pay | Admitting: Endocrinology

## 2015-02-03 NOTE — Telephone Encounter (Signed)
He can wear them as needed when he has swelling

## 2015-02-03 NOTE — Telephone Encounter (Signed)
Patient called, he said the swelling in his legs have gone down quite a bit, he wants to know if he still needs to wear the stockings every day. He said if he has to wear them he would prefer the short stockings. Please advise

## 2015-02-03 NOTE — Telephone Encounter (Signed)
Pt wants a call back from [facility] please

## 2015-02-04 NOTE — Telephone Encounter (Signed)
Noted, patient is aware. 

## 2015-02-11 DIAGNOSIS — E1121 Type 2 diabetes mellitus with diabetic nephropathy: Secondary | ICD-10-CM | POA: Diagnosis not present

## 2015-02-11 DIAGNOSIS — J449 Chronic obstructive pulmonary disease, unspecified: Secondary | ICD-10-CM | POA: Diagnosis not present

## 2015-02-11 DIAGNOSIS — Z0001 Encounter for general adult medical examination with abnormal findings: Secondary | ICD-10-CM | POA: Diagnosis not present

## 2015-02-11 DIAGNOSIS — I10 Essential (primary) hypertension: Secondary | ICD-10-CM | POA: Diagnosis not present

## 2015-02-11 DIAGNOSIS — D631 Anemia in chronic kidney disease: Secondary | ICD-10-CM | POA: Diagnosis not present

## 2015-02-11 DIAGNOSIS — N183 Chronic kidney disease, stage 3 (moderate): Secondary | ICD-10-CM | POA: Diagnosis not present

## 2015-02-11 DIAGNOSIS — E785 Hyperlipidemia, unspecified: Secondary | ICD-10-CM | POA: Diagnosis not present

## 2015-02-11 DIAGNOSIS — Z79899 Other long term (current) drug therapy: Secondary | ICD-10-CM | POA: Diagnosis not present

## 2015-02-11 DIAGNOSIS — Z23 Encounter for immunization: Secondary | ICD-10-CM | POA: Diagnosis not present

## 2015-02-16 DIAGNOSIS — N184 Chronic kidney disease, stage 4 (severe): Secondary | ICD-10-CM | POA: Diagnosis not present

## 2015-03-29 DIAGNOSIS — C44519 Basal cell carcinoma of skin of other part of trunk: Secondary | ICD-10-CM | POA: Diagnosis not present

## 2015-03-29 DIAGNOSIS — D225 Melanocytic nevi of trunk: Secondary | ICD-10-CM | POA: Diagnosis not present

## 2015-03-29 DIAGNOSIS — X32XXXD Exposure to sunlight, subsequent encounter: Secondary | ICD-10-CM | POA: Diagnosis not present

## 2015-03-29 DIAGNOSIS — L570 Actinic keratosis: Secondary | ICD-10-CM | POA: Diagnosis not present

## 2015-03-30 DIAGNOSIS — J449 Chronic obstructive pulmonary disease, unspecified: Secondary | ICD-10-CM | POA: Diagnosis not present

## 2015-03-30 DIAGNOSIS — G4733 Obstructive sleep apnea (adult) (pediatric): Secondary | ICD-10-CM | POA: Diagnosis not present

## 2015-03-30 DIAGNOSIS — R918 Other nonspecific abnormal finding of lung field: Secondary | ICD-10-CM | POA: Diagnosis not present

## 2015-03-30 DIAGNOSIS — R05 Cough: Secondary | ICD-10-CM | POA: Diagnosis not present

## 2015-03-30 DIAGNOSIS — J309 Allergic rhinitis, unspecified: Secondary | ICD-10-CM | POA: Diagnosis not present

## 2015-04-11 ENCOUNTER — Telehealth: Payer: Self-pay | Admitting: Endocrinology

## 2015-04-11 DIAGNOSIS — R918 Other nonspecific abnormal finding of lung field: Secondary | ICD-10-CM | POA: Diagnosis not present

## 2015-04-11 NOTE — Telephone Encounter (Signed)
Pt letting us know the humalog will not be covered by insurance after in 2017. The novolog is the alternate

## 2015-04-19 ENCOUNTER — Other Ambulatory Visit: Payer: Self-pay | Admitting: Endocrinology

## 2015-04-19 ENCOUNTER — Other Ambulatory Visit (INDEPENDENT_AMBULATORY_CARE_PROVIDER_SITE_OTHER): Payer: Medicare PPO

## 2015-04-19 DIAGNOSIS — D638 Anemia in other chronic diseases classified elsewhere: Secondary | ICD-10-CM | POA: Diagnosis not present

## 2015-04-19 DIAGNOSIS — E1121 Type 2 diabetes mellitus with diabetic nephropathy: Secondary | ICD-10-CM | POA: Diagnosis not present

## 2015-04-19 LAB — COMPREHENSIVE METABOLIC PANEL
ALBUMIN: 3.9 g/dL (ref 3.5–5.2)
ALT: 19 U/L (ref 0–53)
AST: 25 U/L (ref 0–37)
Alkaline Phosphatase: 100 U/L (ref 39–117)
BUN: 48 mg/dL — ABNORMAL HIGH (ref 6–23)
CALCIUM: 9.7 mg/dL (ref 8.4–10.5)
CO2: 26 mEq/L (ref 19–32)
CREATININE: 2.55 mg/dL — AB (ref 0.40–1.50)
Chloride: 105 mEq/L (ref 96–112)
GFR: 25.61 mL/min — AB (ref >60.00)
Glucose, Bld: 183 mg/dL — ABNORMAL HIGH (ref 70–99)
Potassium: 5 mEq/L (ref 3.5–5.1)
Sodium: 138 mEq/L (ref 135–145)
TOTAL PROTEIN: 7.5 g/dL (ref 6.0–8.3)
Total Bilirubin: 0.4 mg/dL (ref 0.2–1.2)

## 2015-04-19 LAB — IBC PANEL
IRON: 77 ug/dL (ref 42–165)
Saturation Ratios: 25.3 % (ref 20.0–50.0)
Transferrin: 217 mg/dL (ref 212.0–360.0)

## 2015-04-19 LAB — CBC
HEMATOCRIT: 37.2 % — AB (ref 39.0–52.0)
Hemoglobin: 12.3 g/dL — ABNORMAL LOW (ref 13.0–17.0)
MCHC: 33 g/dL (ref 30.0–36.0)
MCV: 94.1 fl (ref 78.0–100.0)
Platelets: 304 10*3/uL (ref 150.0–400.0)
RBC: 3.96 Mil/uL — ABNORMAL LOW (ref 4.22–5.81)
RDW: 14.1 % (ref 11.5–15.5)
WBC: 11.2 10*3/uL — ABNORMAL HIGH (ref 4.0–10.5)

## 2015-04-19 LAB — HEMOGLOBIN A1C: Hgb A1c MFr Bld: 6.3 % (ref 4.6–6.5)

## 2015-04-19 MED ORDER — GLUCOSE BLOOD VI STRP: ORAL_STRIP | Status: DC

## 2015-04-19 MED ORDER — ACCU-CHEK SOFTCLIX LANCETS MISC: Status: DC

## 2015-04-19 NOTE — Telephone Encounter (Signed)
Please read message below and be advised.  

## 2015-04-19 NOTE — Telephone Encounter (Signed)
He can call for Novolog next year

## 2015-04-19 NOTE — Telephone Encounter (Signed)
Refills sent to [facility] 

## 2015-04-19 NOTE — Telephone Encounter (Signed)
Pt needs refills on lancets and test strips for the accucheck call to [facility]

## 2015-04-20 DIAGNOSIS — N3941 Urge incontinence: Secondary | ICD-10-CM | POA: Diagnosis not present

## 2015-04-20 DIAGNOSIS — R350 Frequency of micturition: Secondary | ICD-10-CM | POA: Diagnosis not present

## 2015-04-22 ENCOUNTER — Ambulatory Visit (INDEPENDENT_AMBULATORY_CARE_PROVIDER_SITE_OTHER): Payer: Medicare PPO | Admitting: Endocrinology

## 2015-04-22 ENCOUNTER — Encounter: Payer: Self-pay | Admitting: Endocrinology

## 2015-04-22 VITALS — BP 136/68 | HR 86 | Temp 98.4°F | Resp 16 | Ht 70.5 in | Wt 236.6 lb

## 2015-04-22 DIAGNOSIS — M25473 Effusion, unspecified ankle: Secondary | ICD-10-CM

## 2015-04-22 DIAGNOSIS — E1121 Type 2 diabetes mellitus with diabetic nephropathy: Secondary | ICD-10-CM

## 2015-04-22 DIAGNOSIS — N184 Chronic kidney disease, stage 4 (severe): Secondary | ICD-10-CM

## 2015-04-22 DIAGNOSIS — R609 Edema, unspecified: Secondary | ICD-10-CM | POA: Diagnosis not present

## 2015-04-22 DIAGNOSIS — Z794 Long term (current) use of insulin: Secondary | ICD-10-CM | POA: Diagnosis not present

## 2015-04-22 NOTE — Patient Instructions (Signed)
Alternate before and after supper sugars  Lasix every 2 days

## 2015-04-22 NOTE — Progress Notes (Signed)
Patient ID: Clayton [name], male   DOB: July 18, 1927, 81 y.o.   MRN: [id number]   Reason for Appointment: Followup of diabetes and various issues  History of Present Illness   Type 2 DIABETES MELITUS   He has had long-standing diabetes and has been on basal bolus insulin for the last few years Since 2014 he has required larger doses of insulin especially with having to take periodic courses of steroids for his pulmonary problems Also his Actos was stopped because of tendency to edema and metformin stopped because of renal dysfunction  Insulin regimen: Humalog 16 units before meals, Lantus  20-25  twice a day.  A1c is consistently under 7% and now 6.3 in stable, not clear if this is affected by his renal dysfunction and anemia Generally compliant with checking his blood sugar at least 3 times a day on average, however usually not after dinner   Current blood sugar patterns and problems identified:  His fasting blood sugars are periodically high but not clear why, not clear this is carryover from the night before as he is not using any snacks late at night are overnight  Blood sugars are sporadically getting lower before 12 noon and only occasionally in the evening  Highest blood sugars are generally before supper  He is not able to adjust his insulin adequately based on his meal size and is usually taking the same amount regardless of his food intake or activity level  Will get occasional hypoglycemia with working outside but does not try to prevent low sugars with activity  Although some of his meals are lower carbohydrate in the evening these are variable  His weight is stable  Hypoglycemia: Not recently as above   Oral hypoglycemic drugs: None           Monitors blood glucose:  3-4 times a day   Glucometer: Accu-Chek        Glucose  monitor download shows The following values, averages and patterns:  Mean values apply above for all meters except median for One  Touch  PRE-MEAL Fasting Lunch Dinner Bedtime Overall  Glucose range:  76-187   60-176   60-227   143, 259   52-259   Mean/median:  141   128   152    140+/-44     Meals: 3 meals per day.  breakfast: oatmeal / grits, Dinner usually at 6:30 pm       Physical activity: exercise: mostly outside yardwork , little walking  Weight history:  Wt Readings from Last 3 Encounters:  04/22/15 236 lb 9.6 oz (107.321 kg)  01/21/15 237 lb 12.8 oz (107.865 kg)  10/05/14 239 lb 9.6 oz (108.682 kg)               Complications are: Nephropathy. Microalbumin level now nearly normal  LABS:  Lab Results  Component Value Date   HGBA1C 6.3 04/19/2015   HGBA1C 6.1 01/19/2015   HGBA1C 6.8* 10/01/2014   Lab Results  Component Value Date   MICROALBUR 17.0* 10/01/2014   LDLCALC 59 01/19/2015   CREATININE 2.55* 04/19/2015     Problem 2:  RENAL insufficiency: He has had chronic increase in creatinine, was 1.7 in 2014. This has been related to nephropathy and glomerulosclerosis Nephrology consultation done, no specific diagnosis made; ACE inhibitor/ARB recommended but not done because of the history of patient getting hyperkalemia with these drugs in the past Renal ultrasound did not show any abnormality  Creatinine is relatively highe  now.   He has had a very mild increase in PTH of 95, but last level was 57  Lab Results  Component Value Date   CREATININE 2.55* 04/19/2015   Proteinuria: He was given diltiazem previously For his proteinuria  when blood pressure was relatively high  but he claims that it was causing itching on his abdominal area and he stopped it  Microalbumin/creatinine ratio was last normal at 17     Medication List       This list is accurate as of: 04/22/15 11:59 PM.  Always use your most recent med list.               ACCU-CHEK AVIVA PLUS W/DEVICE Kit  Use to check blood sugars 4 times per day. Dx code: E11.9     ACCU-CHEK AVIVA Soln  Use as directed      ACCU-CHEK SOFTCLIX LANCETS lancets  TEST AS INSTRUCTED TO OBTAIN A BLOOD SPECIMEN 4 TIMES PER DAY. DX: E11.21     Alcohol Wipes 70 % Pads  Use 7 pads per day     Alcohol Prep Pads     budesonide-formoterol 160-4.5 MCG/ACT inhaler  Commonly known as:  SYMBICORT  Inhale 2 puffs into the lungs 2 (two) times daily.     SYMBICORT 160-4.5 MCG/ACT inhaler  Generic drug:  budesonide-formoterol     chlorpheniramine 4 MG tablet  Commonly known as:  CHLOR-TRIMETON  2 tablets by mouth at bedtime. 1 extra in am daily as needed.     dextromethorphan-guaiFENesin 30-600 MG 12hr tablet  Commonly known as:  MUCINEX DM  1-2 every 12 hours as needed for cough (with flutter valve)     Ferrous Sulfate 27 MG Tabs  Take 1 tablet by mouth daily.     FIBER PO  1 every am and 1 at bedtime     finasteride 5 MG tablet  Commonly known as:  PROSCAR  TAKE 1 TABLET EVERY DAY     FLUTTER Devi  Use as directed.     furosemide 20 MG tablet  Commonly known as:  LASIX     glucose blood test strip  Commonly known as:  ACCU-CHEK AVIVA PLUS  USE AS INSTRUCTED TO CHECK BLOOD SUGAR FOUR TIMES DAILY. DX: E11.21     insulin lispro 100 UNIT/ML injection  Commonly known as:  HUMALOG  Inject 0.21 mLs (21 Units total) into the skin 3 (three) times daily with meals. Sliding scale with each meal     INSULIN SYRINGE .3CC/31GX5/16" 31G X 5/16" 0.3 ML Misc  Use 3 per day     LANTUS 100 UNIT/ML injection  Generic drug:  insulin glargine  INJECT 24 UNITS SUBCUTANEOUSLY IN THE MORNING AND 20 UNITS IN THE EVENING     montelukast 10 MG tablet  Commonly known as:  SINGULAIR  TAKE 1 TABLET BY MOUTH EVERY NIGHT AT BEDTIME     multivitamin with minerals Tabs tablet  Take 1 tablet by mouth daily.     omeprazole 20 MG capsule  Commonly known as:  PRILOSEC     pantoprazole 40 MG tablet  Commonly known as:  PROTONIX  TAKE 1 TABLET BY MOUTH EVERY DAY 30 TO 60 MINUTES PRIOR TO FIRST MEAL OF THE DAY     PROAIR HFA 108  (90 BASE) MCG/ACT inhaler  Generic drug:  albuterol     simvastatin 20 MG tablet  Commonly known as:  ZOCOR  TAKE 1 TABLET EVERY DAY     TOVIAZ  8 MG Tb24 tablet  Generic drug:  fesoterodine     Vitamin D3 2000 UNITS Tabs  Take 1 capsule by mouth every morning.     zolpidem 5 MG tablet  Commonly known as:  AMBIEN  Take 1 tablet (5 mg total) by mouth at bedtime as needed for sleep.     zolpidem 6.25 MG CR tablet  Commonly known as:  AMBIEN CR        Allergies:  Allergies  Allergen Reactions  . Penicillins   . Diltiazem Rash    Past Medical History  Diagnosis Date  . Diabetes mellitus   . Hypertension   . Anemia   . Chronic renal insufficiency   . Osteoporosis   . Neuromuscular disorder ([place])   . DDD (degenerative disc disease), lumbar     Past Surgical History  Procedure Laterality Date  . Vasectomy    . Eye surgery    . Video bronchoscopy Bilateral 08/25/2012    Procedure: VIDEO BRONCHOSCOPY WITHOUT FLUORO;  Surgeon: Brand Males, MD;  Location: [facility];  Service: Cardiopulmonary;  Laterality: Bilateral;    Family History  Problem Relation Age of Onset  . Diabetes Mellitus II    . Hypertension      Social History:  reports that he quit smoking about 41 years ago. His smoking use included Cigarettes. He has a 99 pack-year smoking history. He quit smokeless tobacco use about 39 years ago. He reports that he does not drink alcohol or use illicit drugs.  Review of Systems -   HYPERTENSION: This has been present in the past.  Currently not on any medications with good control consistently  Previous history of anemia related to renal dysfunction, recent hemoglobin is slightly improved without any iron deficiency   Lab Results  Component Value Date   WBC 11.2* 04/19/2015   HGB 12.3* 04/19/2015   HCT 37.2* 04/19/2015   MCV 94.1 04/19/2015   PLT 304.0 04/19/2015    Cough has become chronic, sometimes has p.m.  Uses his inhaler or nebulizer as  needed along with Symbicort  He has been given CPAP and this is being adjusted by pulmonologist.    Edema: Mostly on the left leg previously, now improved but still continues to take Lasix. He did not like wearing the elastic stockings prescribed previously  He has had chronic lower urinary tract problems and BPH.  Prescriptions are being done by his urologist  HYPERLIPIDEMIA:         The lipid abnormality consists of  minimally elevated LDL, taking simvastatin for cardiovascular protection.  Has good levels although LDL low normal   Lab Results  Component Value Date   CHOL 119 01/19/2015   HDL 41.50 01/19/2015   LDLCALC 59 01/19/2015   TRIG 90.0 01/19/2015   CHOLHDL 3 01/19/2015     Diabetic foot exam results in 8/16: nearly normal monofilament sensation in the toes and plantar surfaces, no skin lesions or ulcers on the feet and normal pedal pulses   Examination:   BP 136/68 mmHg  Pulse 86  Temp(Src) 98.4 F (36.9 C)  Resp 16  Ht 5' 10.5" (1.791 m)  Wt 236 lb 9.6 oz (107.321 kg)  BMI 33.46 kg/m2  SpO2 96%  Body mass index is 33.46 kg/(m^2).   Repeat blood pressure  118/64 standing  Trace ankle edema present on the left side    Assesment/PLAN:   1. Diabetes type 2  The patient's diabetes control is overall reasonably well controlled with  basal bolus insulin regimen. See history of present illness  His A1c is down below 7% consistently and now  6.3 although this is be falsely lower because of  his renal function and mild anemia  Still requiring relatively larger dose of Lantus in the evening that in the morning without overnight hypoglycemia His fasting blood sugars may depend on his diet the night before and lack of adjustment of evening insulin based on meal size Discussed importance checking mornings after supper  instead of just before eating Also he needs to reduce Novolog when he is planning to be more active Will not change his basic regimen  Discussed  day-to-day management of insulin, diet, meal planning and monitoring  2.   Chronic kidney disease: His creatinine is overall higher but stable recently Will have him follow-up in nephrologist again  3.  Hypertension: Blood pressure is normal without any medication   4.  History of mild anemia, likely improved will need follow-up periodically.  No iron deficiency This is related to chronic kidney disease  5.  Left leg edema is improved and since his blood pressure is low normal to have him take his Lasix every other day for now He does have venous insufficiency but does not like to wear elastic stockings  Patient Instructions  Alternate before and after supper sugars  Lasix every 2 days     Counseling time on subjects discussed above is over 50% of today's 25 minute visit  Raylee Strehl  04/24/2015, 3:30 PM

## 2015-04-25 DIAGNOSIS — G4733 Obstructive sleep apnea (adult) (pediatric): Secondary | ICD-10-CM | POA: Diagnosis not present

## 2015-04-26 DIAGNOSIS — M2040 Other hammer toe(s) (acquired), unspecified foot: Secondary | ICD-10-CM | POA: Diagnosis not present

## 2015-04-26 DIAGNOSIS — E1121 Type 2 diabetes mellitus with diabetic nephropathy: Secondary | ICD-10-CM | POA: Diagnosis not present

## 2015-04-26 DIAGNOSIS — L97509 Non-pressure chronic ulcer of other part of unspecified foot with unspecified severity: Secondary | ICD-10-CM | POA: Diagnosis not present

## 2015-05-10 ENCOUNTER — Telehealth: Payer: Self-pay | Admitting: Endocrinology

## 2015-05-10 NOTE — Telephone Encounter (Signed)
Pt went to dentist and they are asking for your permission to start pt on the calcium supplements again. Please advise

## 2015-05-10 NOTE — Telephone Encounter (Signed)
Please see below and advise.

## 2015-05-10 NOTE — Telephone Encounter (Signed)
He can start calcium

## 2015-05-11 NOTE — Telephone Encounter (Signed)
Noted, patient is aware. 

## 2015-05-16 DIAGNOSIS — R809 Proteinuria, unspecified: Secondary | ICD-10-CM | POA: Diagnosis not present

## 2015-05-16 DIAGNOSIS — E1129 Type 2 diabetes mellitus with other diabetic kidney complication: Secondary | ICD-10-CM | POA: Diagnosis not present

## 2015-05-16 DIAGNOSIS — I129 Hypertensive chronic kidney disease with stage 1 through stage 4 chronic kidney disease, or unspecified chronic kidney disease: Secondary | ICD-10-CM | POA: Diagnosis not present

## 2015-05-16 DIAGNOSIS — N183 Chronic kidney disease, stage 3 (moderate): Secondary | ICD-10-CM | POA: Diagnosis not present

## 2015-05-16 DIAGNOSIS — N2581 Secondary hyperparathyroidism of renal origin: Secondary | ICD-10-CM | POA: Diagnosis not present

## 2015-05-26 DIAGNOSIS — E113391 Type 2 diabetes mellitus with moderate nonproliferative diabetic retinopathy without macular edema, right eye: Secondary | ICD-10-CM | POA: Diagnosis not present

## 2015-05-26 DIAGNOSIS — E113392 Type 2 diabetes mellitus with moderate nonproliferative diabetic retinopathy without macular edema, left eye: Secondary | ICD-10-CM | POA: Diagnosis not present

## 2015-05-26 DIAGNOSIS — H35373 Puckering of macula, bilateral: Secondary | ICD-10-CM | POA: Diagnosis not present

## 2015-05-26 DIAGNOSIS — H43812 Vitreous degeneration, left eye: Secondary | ICD-10-CM | POA: Diagnosis not present

## 2015-06-14 DIAGNOSIS — J069 Acute upper respiratory infection, unspecified: Secondary | ICD-10-CM | POA: Diagnosis not present

## 2015-06-14 DIAGNOSIS — J449 Chronic obstructive pulmonary disease, unspecified: Secondary | ICD-10-CM | POA: Diagnosis not present

## 2015-06-14 DIAGNOSIS — W540XXA Bitten by dog, initial encounter: Secondary | ICD-10-CM | POA: Diagnosis not present

## 2015-06-14 DIAGNOSIS — S81811A Laceration without foreign body, right lower leg, initial encounter: Secondary | ICD-10-CM | POA: Diagnosis not present

## 2015-06-27 ENCOUNTER — Other Ambulatory Visit: Payer: Self-pay | Admitting: Family Medicine

## 2015-06-27 ENCOUNTER — Ambulatory Visit
Admission: RE | Admit: 2015-06-27 | Discharge: 2015-06-27 | Disposition: A | Discharge status: Home / Self Care | Payer: Medicare PPO | Source: Ambulatory Visit | Attending: Family Medicine | Admitting: Family Medicine

## 2015-06-27 DIAGNOSIS — R05 Cough: Secondary | ICD-10-CM | POA: Diagnosis not present

## 2015-06-27 DIAGNOSIS — E1121 Type 2 diabetes mellitus with diabetic nephropathy: Secondary | ICD-10-CM | POA: Diagnosis not present

## 2015-06-27 DIAGNOSIS — J441 Chronic obstructive pulmonary disease with (acute) exacerbation: Secondary | ICD-10-CM | POA: Diagnosis not present

## 2015-06-27 DIAGNOSIS — Z794 Long term (current) use of insulin: Secondary | ICD-10-CM | POA: Diagnosis not present

## 2015-06-27 DIAGNOSIS — J189 Pneumonia, unspecified organism: Secondary | ICD-10-CM | POA: Diagnosis not present

## 2015-06-29 DIAGNOSIS — G4733 Obstructive sleep apnea (adult) (pediatric): Secondary | ICD-10-CM | POA: Diagnosis not present

## 2015-06-29 DIAGNOSIS — J449 Chronic obstructive pulmonary disease, unspecified: Secondary | ICD-10-CM | POA: Diagnosis not present

## 2015-06-29 DIAGNOSIS — R911 Solitary pulmonary nodule: Secondary | ICD-10-CM | POA: Diagnosis not present

## 2015-07-06 ENCOUNTER — Other Ambulatory Visit: Payer: Self-pay | Admitting: Endocrinology

## 2015-07-06 ENCOUNTER — Telehealth: Payer: Self-pay | Admitting: Endocrinology

## 2015-07-06 NOTE — Telephone Encounter (Signed)
rx sent

## 2015-07-06 NOTE — Telephone Encounter (Signed)
Patient called and would like a refill on his Rx  Rx: Syringes   Pharmacy: [facility]    Thank you

## 2015-07-10 DIAGNOSIS — D631 Anemia in chronic kidney disease: Secondary | ICD-10-CM | POA: Diagnosis not present

## 2015-07-10 DIAGNOSIS — G4733 Obstructive sleep apnea (adult) (pediatric): Secondary | ICD-10-CM | POA: Diagnosis not present

## 2015-07-10 DIAGNOSIS — E669 Obesity, unspecified: Secondary | ICD-10-CM | POA: Diagnosis not present

## 2015-07-10 DIAGNOSIS — J309 Allergic rhinitis, unspecified: Secondary | ICD-10-CM | POA: Diagnosis not present

## 2015-07-10 DIAGNOSIS — H9193 Unspecified hearing loss, bilateral: Secondary | ICD-10-CM | POA: Diagnosis not present

## 2015-07-10 DIAGNOSIS — E785 Hyperlipidemia, unspecified: Secondary | ICD-10-CM | POA: Diagnosis not present

## 2015-07-10 DIAGNOSIS — I10 Essential (primary) hypertension: Secondary | ICD-10-CM | POA: Diagnosis not present

## 2015-07-10 DIAGNOSIS — J449 Chronic obstructive pulmonary disease, unspecified: Secondary | ICD-10-CM | POA: Diagnosis not present

## 2015-07-10 DIAGNOSIS — E1122 Type 2 diabetes mellitus with diabetic chronic kidney disease: Secondary | ICD-10-CM | POA: Diagnosis not present

## 2015-07-19 ENCOUNTER — Other Ambulatory Visit (INDEPENDENT_AMBULATORY_CARE_PROVIDER_SITE_OTHER): Payer: Medicare PPO

## 2015-07-19 DIAGNOSIS — N184 Chronic kidney disease, stage 4 (severe): Secondary | ICD-10-CM | POA: Diagnosis not present

## 2015-07-19 DIAGNOSIS — Z794 Long term (current) use of insulin: Secondary | ICD-10-CM

## 2015-07-19 DIAGNOSIS — E1121 Type 2 diabetes mellitus with diabetic nephropathy: Secondary | ICD-10-CM

## 2015-07-19 LAB — COMPREHENSIVE METABOLIC PANEL
ALBUMIN: 3.7 g/dL (ref 3.5–5.2)
ALT: 15 U/L (ref 0–53)
AST: 18 U/L (ref 0–37)
Alkaline Phosphatase: 87 U/L (ref 39–117)
BUN: 38 mg/dL — AB (ref 6–23)
CHLORIDE: 106 meq/L (ref 96–112)
CO2: 26 mEq/L (ref 19–32)
CREATININE: 2.41 mg/dL — AB (ref 0.40–1.50)
Calcium: 9.3 mg/dL (ref 8.4–10.5)
GFR: 27.32 mL/min — ABNORMAL LOW (ref >60.00)
Glucose, Bld: 66 mg/dL — ABNORMAL LOW (ref 70–99)
Potassium: 4.7 mEq/L (ref 3.5–5.1)
SODIUM: 140 meq/L (ref 135–145)
Total Bilirubin: 0.3 mg/dL (ref 0.2–1.2)
Total Protein: 7.2 g/dL (ref 6.0–8.3)

## 2015-07-19 LAB — CBC WITH DIFFERENTIAL/PLATELET
BASOS PCT: 0.4 % (ref 0.0–3.0)
Basophils Absolute: 0 10*3/uL (ref 0.0–0.1)
EOS PCT: 10.2 % — AB (ref 0.0–5.0)
Eosinophils Absolute: 1.1 10*3/uL — ABNORMAL HIGH (ref 0.0–0.7)
HEMATOCRIT: 37 % — AB (ref 39.0–52.0)
HEMOGLOBIN: 12.1 g/dL — AB (ref 13.0–17.0)
Lymphocytes Relative: 19.1 % (ref 12.0–46.0)
Lymphs Abs: 2.1 10*3/uL (ref 0.7–4.0)
MCHC: 32.6 g/dL (ref 30.0–36.0)
MCV: 95 fl (ref 78.0–100.0)
Monocytes Absolute: 1.2 10*3/uL — ABNORMAL HIGH (ref 0.1–1.0)
Monocytes Relative: 10.8 % (ref 3.0–12.0)
Neutro Abs: 6.4 10*3/uL (ref 1.4–7.7)
Neutrophils Relative %: 59.5 % (ref 43.0–77.0)
Platelets: 263 10*3/uL (ref 150.0–400.0)
RBC: 3.9 Mil/uL — AB (ref 4.22–5.81)
RDW: 14.4 % (ref 11.5–15.5)
WBC: 10.8 10*3/uL — AB (ref 4.0–10.5)

## 2015-07-19 LAB — URINALYSIS, ROUTINE W REFLEX MICROSCOPIC
Bilirubin Urine: NEGATIVE
HGB URINE DIPSTICK: NEGATIVE
KETONES UR: NEGATIVE
Leukocytes, UA: NEGATIVE
NITRITE: NEGATIVE
Specific Gravity, Urine: 1.015 (ref 1.000–1.030)
URINE GLUCOSE: NEGATIVE
UROBILINOGEN UA: 0.2 (ref 0.0–1.0)
pH: 6 (ref 5.0–8.0)

## 2015-07-19 LAB — MICROALBUMIN / CREATININE URINE RATIO
Creatinine,U: 89.6 mg/dL
Microalb Creat Ratio: 13.1 mg/g (ref 0.0–30.0)
Microalb, Ur: 11.7 mg/dL — ABNORMAL HIGH (ref 0.0–1.9)

## 2015-07-19 LAB — HEMOGLOBIN A1C: Hgb A1c MFr Bld: 7.1 % — ABNORMAL HIGH (ref 4.6–6.5)

## 2015-07-22 ENCOUNTER — Encounter: Payer: Self-pay | Admitting: Endocrinology

## 2015-07-22 ENCOUNTER — Ambulatory Visit (INDEPENDENT_AMBULATORY_CARE_PROVIDER_SITE_OTHER): Payer: Medicare PPO | Admitting: Endocrinology

## 2015-07-22 VITALS — BP 124/70 | HR 98 | Temp 98.3°F | Resp 16 | Ht 70.5 in | Wt 235.0 lb

## 2015-07-22 DIAGNOSIS — Z794 Long term (current) use of insulin: Secondary | ICD-10-CM | POA: Diagnosis not present

## 2015-07-22 DIAGNOSIS — X32XXXD Exposure to sunlight, subsequent encounter: Secondary | ICD-10-CM | POA: Diagnosis not present

## 2015-07-22 DIAGNOSIS — E1121 Type 2 diabetes mellitus with diabetic nephropathy: Secondary | ICD-10-CM

## 2015-07-22 DIAGNOSIS — L570 Actinic keratosis: Secondary | ICD-10-CM | POA: Diagnosis not present

## 2015-07-22 DIAGNOSIS — N184 Chronic kidney disease, stage 4 (severe): Secondary | ICD-10-CM | POA: Diagnosis not present

## 2015-07-22 NOTE — Progress Notes (Signed)
Patient ID: Clayton [name], male   DOB: 1927-10-14, 82 y.o.   MRN: [id number]   Reason for Appointment: Followup of diabetes and various issues  History of Present Illness   Type 2 DIABETES MELITUS   He has had long-standing diabetes and has been on basal bolus insulin for the last few years Since 2014 he has required larger doses of insulin especially with having to take periodic courses of steroids for his pulmonary problems Also his Actos was stopped because of tendency to edema and metformin stopped because of renal dysfunction  Insulin regimen: Humalog 18 units before meals, Lantus  20-25  a day.  A1c is relatively higher on this visit over 7%, previously below 7% He thinks this is related to his respiratory illness and getting steroids last month  Generally compliant with checking his blood sugar at least 3 times a day on average, however usually not after dinner   Current blood sugar patterns and problems identified:  His fasting blood sugars are trending down recently and have been somewhat variable; lab glucose was 66  He says that when he had high sugars from steroids as high as 3 62 he took extra Humalog but not extra Lantus  His blood sugars are still relatively higher around suppertime  Not able to do any exercise  His weight is stable  Hypoglycemia: Not recently as above   Oral hypoglycemic drugs: None           Monitors blood glucose:  3-4 times a day   Glucometer: Accu-Chek        Glucose  monitor download shows the following results and averages Average reading are for the last weeks  Mean values apply above for all meters except median for One Touch  PRE-MEAL Fasting Lunch Dinner Bedtime Overall  Glucose range:  94-177   88-195   134-294   149    Mean/median:  135   140   180    150     Meals: 3 meals per day.  breakfast: oatmeal / grits, Dinner usually at 6:30 pm       Physical activity: exercise: mostly outside yardwork , little  walking  Weight history:  Wt Readings from Last 3 Encounters:  07/22/15 235 lb (106.595 kg)  04/22/15 236 lb 9.6 oz (107.321 kg)  01/21/15 237 lb 12.8 oz (107.865 kg)               Complications are: Nephropathy. Microalbumin level now consistently normal  LABS:  Lab Results  Component Value Date   HGBA1C 7.1* 07/19/2015   HGBA1C 6.3 04/19/2015   HGBA1C 6.1 01/19/2015   Lab Results  Component Value Date   MICROALBUR 11.7* 07/19/2015   LDLCALC 59 01/19/2015   CREATININE 2.41* 07/19/2015     Problem 2:  RENAL insufficiency: He has had chronic increase in creatinine, was 1.7 in 2014. This has been related to nephropathy and glomerulosclerosis Nephrology consultation done, no specific diagnosis made; ACE inhibitor/ARB recommended but not done because of the history of patient getting hyperkalemia with these drugs in the past Renal ultrasound did not show any abnormality  Creatinine is gradually improving over the last 6 months  He has had a very mild increase in PTH of 95, but last level was 57  Lab Results  Component Value Date   CREATININE 2.41* 07/19/2015   Proteinuria: He was given diltiazem previously For his proteinuria  when blood pressure was relatively high  but he claims  that it was causing itching on his abdominal area and he stopped it  Microalbumin/creatinine ratio is again normal     Medication List       This list is accurate as of: 07/22/15  4:36 PM.  Always use your most recent med list.               ACCU-CHEK AVIVA PLUS w/Device Kit  Use to check blood sugars 4 times per day. Dx code: E11.9     ACCU-CHEK AVIVA Soln  Use as directed     ACCU-CHEK SOFTCLIX LANCETS lancets  TEST AS INSTRUCTED TO OBTAIN A BLOOD SPECIMEN 4 TIMES PER DAY. DX: E11.21     Alcohol Wipes 70 % Pads  Use 7 pads per day     Alcohol Prep Pads     BD INSULIN SYRINGE ULTRAFINE 31G X 5/16" 0.3 ML Misc  Generic drug:  Insulin Syringe-Needle U-100  USE THREE DAILY      budesonide-formoterol 160-4.5 MCG/ACT inhaler  Commonly known as:  SYMBICORT  Inhale 2 puffs into the lungs 2 (two) times daily.     SYMBICORT 160-4.5 MCG/ACT inhaler  Generic drug:  budesonide-formoterol     chlorpheniramine 4 MG tablet  Commonly known as:  CHLOR-TRIMETON  2 tablets by mouth at bedtime. 1 extra in am daily as needed.     dextromethorphan-guaiFENesin 30-600 MG 12hr tablet  Commonly known as:  MUCINEX DM  1-2 every 12 hours as needed for cough (with flutter valve)     Ferrous Sulfate 27 MG Tabs  Take 1 tablet by mouth daily.     FIBER PO  1 every am and 1 at bedtime     finasteride 5 MG tablet  Commonly known as:  PROSCAR  TAKE 1 TABLET EVERY DAY     FLUTTER Devi  Use as directed.     furosemide 20 MG tablet  Commonly known as:  LASIX     glucose blood test strip  Commonly known as:  ACCU-CHEK AVIVA PLUS  USE AS INSTRUCTED TO CHECK BLOOD SUGAR FOUR TIMES DAILY. DX: E11.21     insulin lispro 100 UNIT/ML injection  Commonly known as:  HUMALOG  Inject 0.21 mLs (21 Units total) into the skin 3 (three) times daily with meals. Sliding scale with each meal     LANTUS 100 UNIT/ML injection  Generic drug:  insulin glargine  INJECT 24 UNITS SUBCUTANEOUSLY IN THE MORNING AND 20 UNITS IN THE EVENING     montelukast 10 MG tablet  Commonly known as:  SINGULAIR  TAKE 1 TABLET BY MOUTH EVERY NIGHT AT BEDTIME     multivitamin with minerals Tabs tablet  Take 1 tablet by mouth daily.     omeprazole 20 MG capsule  Commonly known as:  PRILOSEC     pantoprazole 40 MG tablet  Commonly known as:  PROTONIX  TAKE 1 TABLET BY MOUTH EVERY DAY 30 TO 60 MINUTES PRIOR TO FIRST MEAL OF THE DAY     PROAIR HFA 108 (90 Base) MCG/ACT inhaler  Generic drug:  albuterol     simvastatin 20 MG tablet  Commonly known as:  ZOCOR  TAKE 1 TABLET EVERY DAY     TOVIAZ 8 MG Tb24 tablet  Generic drug:  fesoterodine     Vitamin D3 2000 units Tabs  Take 1 capsule by mouth every  morning.     zolpidem 5 MG tablet  Commonly known as:  AMBIEN  Take 1 tablet (5 mg total)  by mouth at bedtime as needed for sleep.     zolpidem 6.25 MG CR tablet  Commonly known as:  AMBIEN CR        Allergies:  Allergies  Allergen Reactions  . Penicillins   . Diltiazem Rash    Past Medical History  Diagnosis Date  . Diabetes mellitus   . Hypertension   . Anemia   . Chronic renal insufficiency   . Osteoporosis   . Neuromuscular disorder ([place])   . DDD (degenerative disc disease), lumbar     Past Surgical History  Procedure Laterality Date  . Vasectomy    . Eye surgery    . Video bronchoscopy Bilateral 08/25/2012    Procedure: VIDEO BRONCHOSCOPY WITHOUT FLUORO;  Surgeon: Brand Males, MD;  Location: [facility];  Service: Cardiopulmonary;  Laterality: Bilateral;    Family History  Problem Relation Age of Onset  . Diabetes Mellitus II    . Hypertension      Social History:  reports that he quit smoking about 42 years ago. His smoking use included Cigarettes. He has a 99 pack-year smoking history. He quit smokeless tobacco use about 39 years ago. He reports that he does not drink alcohol or use illicit drugs.  Review of Systems -   HYPERTENSION: This has been present in the past.  Currently not on any medications with good control consistently  Previous history of anemia related to renal dysfunction, recent hemoglobin is slightly improved without any iron deficiency   Lab Results  Component Value Date   WBC 10.8* 07/19/2015   HGB 12.1* 07/19/2015   HCT 37.0* 07/19/2015   MCV 95.0 07/19/2015   PLT 263.0 07/19/2015    Cough has become chronic, sometimes has p.m.  Uses his inhaler or nebulizer as needed along with Symbicort  He has been given CPAP and this is being adjusted by pulmonologist.    Edema: Mostly on the left leg previously, now not a problem Taking 20 mg Lasix He did not like wearing the elastic stockings prescribed previously  He has  had chronic lower urinary tract problems and BPH.  Prescriptions are being done by his urologist  HYPERLIPIDEMIA:         The lipid abnormality consists of  minimally elevated LDL, taking simvastatin for cardiovascular protection.  Has good levels although LDL low normal   Lab Results  Component Value Date   CHOL 119 01/19/2015   HDL 41.50 01/19/2015   LDLCALC 59 01/19/2015   TRIG 90.0 01/19/2015   CHOLHDL 3 01/19/2015     Diabetic foot exam results in 8/16: nearly normal monofilament sensation in the toes and plantar surfaces, no skin lesions or ulcers on the feet and normal pedal pulses   Examination:   BP 124/70 mmHg  Pulse 98  Temp(Src) 98.3 F (36.8 C)  Resp 16  Ht 5' 10.5" (1.791 m)  Wt 235 lb (106.595 kg)  BMI 33.23 kg/m2  SpO2 96%  Body mass index is 33.23 kg/(m^2).   No ankle edema    Assesment/PLAN:   1. Diabetes type 2  The patient's diabetes control is overall reasonably well controlled with basal bolus insulin regimen. See history of present illness for details Although his sugars are variable he tends to have high readings after meals in the afternoon and evening Currently blood sugars are trending downwards in the mornings and lunchtime Despite reminders he does not check readings after supper, discussed today  Discussed blood sugar targets He needs to reduce his  Lantus in the evening to avoid overnight hypoglycemia especially since his fasting reading was only 66 He may need to continue higher doses of Humalog at suppertime for now Encouraged him to use an exercise bike or other forms of exercise  2.   Chronic kidney disease: His creatinine is overall high but is slowly me down No changes made in regimen for some time by nephrologist and he can continue to follow here Will check his PTH periodically Microalbumin is normal  3.  Hypertension: Blood pressure is normal without any medication   4.  History of mild anemia, stable This is related to  chronic kidney disease  5.  Left leg edema is controlled with low-dose Lasix Encouraged him to elevate his feet as much as possible  Patient Instructions  Humalog 16 units am and lunch  Lantus 20 in am and 22 at nite  If getting steroid need to up Lantus  Check blood sugars on waking up  4-5 times a week Also check blood sugars about 2 hours after a meal and do this after different meals by rotation  Recommended blood sugar levels on waking up is 90-130 and about 2 hours after meal is 130-160  Please bring your blood sugar monitor to each visit, thank you    Counseling time on subjects discussed above is over 50% of today's 25 minute visit  Amali Uhls  07/22/2015, 4:36 PM

## 2015-07-22 NOTE — Patient Instructions (Addendum)
Humalog 16 units am and lunch  Lantus 20 in am and 22 at nite  If getting steroid need to up Lantus  Check blood sugars on waking up  4-5 times a week Also check blood sugars about 2 hours after a meal and do this after different meals by rotation  Recommended blood sugar levels on waking up is 90-130 and about 2 hours after meal is 130-160  Please bring your blood sugar monitor to each visit, thank you

## 2015-07-29 DIAGNOSIS — G4733 Obstructive sleep apnea (adult) (pediatric): Secondary | ICD-10-CM | POA: Diagnosis not present

## 2015-07-29 DIAGNOSIS — C3490 Malignant neoplasm of unspecified part of unspecified bronchus or lung: Secondary | ICD-10-CM | POA: Diagnosis not present

## 2015-07-29 DIAGNOSIS — R05 Cough: Secondary | ICD-10-CM | POA: Diagnosis not present

## 2015-07-29 DIAGNOSIS — I10 Essential (primary) hypertension: Secondary | ICD-10-CM | POA: Diagnosis not present

## 2015-08-04 ENCOUNTER — Other Ambulatory Visit: Payer: Self-pay | Admitting: *Deleted

## 2015-08-04 MED ORDER — INSULIN ASPART 100 UNIT/ML ~~LOC~~ SOLN: SUBCUTANEOUS | Status: DC

## 2015-08-30 ENCOUNTER — Other Ambulatory Visit: Payer: Self-pay | Admitting: Endocrinology

## 2015-09-17 ENCOUNTER — Other Ambulatory Visit: Payer: Self-pay | Admitting: Endocrinology

## 2015-09-25 ENCOUNTER — Emergency Department (HOSPITAL_BASED_OUTPATIENT_CLINIC_OR_DEPARTMENT_OTHER): Payer: Medicare PPO

## 2015-09-25 ENCOUNTER — Encounter (HOSPITAL_BASED_OUTPATIENT_CLINIC_OR_DEPARTMENT_OTHER): Payer: Self-pay | Admitting: Emergency Medicine

## 2015-09-25 ENCOUNTER — Emergency Department (HOSPITAL_BASED_OUTPATIENT_CLINIC_OR_DEPARTMENT_OTHER)
Admission: EM | Admit: 2015-09-25 | Discharge: 2015-09-25 | Disposition: A | Discharge status: Home / Self Care | Payer: Medicare PPO | Attending: Emergency Medicine | Admitting: Emergency Medicine

## 2015-09-25 DIAGNOSIS — S80212A Abrasion, left knee, initial encounter: Secondary | ICD-10-CM | POA: Insufficient documentation

## 2015-09-25 DIAGNOSIS — Z880 Allergy status to penicillin: Secondary | ICD-10-CM | POA: Diagnosis not present

## 2015-09-25 DIAGNOSIS — S0992XA Unspecified injury of nose, initial encounter: Secondary | ICD-10-CM | POA: Insufficient documentation

## 2015-09-25 DIAGNOSIS — Z7951 Long term (current) use of inhaled steroids: Secondary | ICD-10-CM | POA: Diagnosis not present

## 2015-09-25 DIAGNOSIS — Z23 Encounter for immunization: Secondary | ICD-10-CM | POA: Diagnosis not present

## 2015-09-25 DIAGNOSIS — Z8669 Personal history of other diseases of the nervous system and sense organs: Secondary | ICD-10-CM | POA: Insufficient documentation

## 2015-09-25 DIAGNOSIS — E119 Type 2 diabetes mellitus without complications: Secondary | ICD-10-CM | POA: Diagnosis not present

## 2015-09-25 DIAGNOSIS — S60812A Abrasion of left wrist, initial encounter: Secondary | ICD-10-CM | POA: Diagnosis not present

## 2015-09-25 DIAGNOSIS — Y9389 Activity, other specified: Secondary | ICD-10-CM | POA: Insufficient documentation

## 2015-09-25 DIAGNOSIS — S0031XA Abrasion of nose, initial encounter: Secondary | ICD-10-CM | POA: Insufficient documentation

## 2015-09-25 DIAGNOSIS — M542 Cervicalgia: Secondary | ICD-10-CM

## 2015-09-25 DIAGNOSIS — M25562 Pain in left knee: Secondary | ICD-10-CM

## 2015-09-25 DIAGNOSIS — Z8739 Personal history of other diseases of the musculoskeletal system and connective tissue: Secondary | ICD-10-CM | POA: Diagnosis not present

## 2015-09-25 DIAGNOSIS — Y92007 Garden or yard of unspecified non-institutional (private) residence as the place of occurrence of the external cause: Secondary | ICD-10-CM | POA: Diagnosis not present

## 2015-09-25 DIAGNOSIS — W010XXA Fall on same level from slipping, tripping and stumbling without subsequent striking against object, initial encounter: Secondary | ICD-10-CM | POA: Diagnosis not present

## 2015-09-25 DIAGNOSIS — I129 Hypertensive chronic kidney disease with stage 1 through stage 4 chronic kidney disease, or unspecified chronic kidney disease: Secondary | ICD-10-CM | POA: Diagnosis not present

## 2015-09-25 DIAGNOSIS — Z87891 Personal history of nicotine dependence: Secondary | ICD-10-CM | POA: Diagnosis not present

## 2015-09-25 DIAGNOSIS — S6991XA Unspecified injury of right wrist, hand and finger(s), initial encounter: Secondary | ICD-10-CM | POA: Insufficient documentation

## 2015-09-25 DIAGNOSIS — Y998 Other external cause status: Secondary | ICD-10-CM | POA: Insufficient documentation

## 2015-09-25 DIAGNOSIS — S199XXA Unspecified injury of neck, initial encounter: Secondary | ICD-10-CM | POA: Insufficient documentation

## 2015-09-25 DIAGNOSIS — S0990XA Unspecified injury of head, initial encounter: Secondary | ICD-10-CM | POA: Diagnosis not present

## 2015-09-25 DIAGNOSIS — N189 Chronic kidney disease, unspecified: Secondary | ICD-10-CM | POA: Diagnosis not present

## 2015-09-25 DIAGNOSIS — W19XXXA Unspecified fall, initial encounter: Secondary | ICD-10-CM

## 2015-09-25 DIAGNOSIS — D649 Anemia, unspecified: Secondary | ICD-10-CM | POA: Diagnosis not present

## 2015-09-25 DIAGNOSIS — M79641 Pain in right hand: Secondary | ICD-10-CM

## 2015-09-25 DIAGNOSIS — Z794 Long term (current) use of insulin: Secondary | ICD-10-CM | POA: Insufficient documentation

## 2015-09-25 DIAGNOSIS — S8992XA Unspecified injury of left lower leg, initial encounter: Secondary | ICD-10-CM | POA: Diagnosis not present

## 2015-09-25 DIAGNOSIS — R040 Epistaxis: Secondary | ICD-10-CM | POA: Insufficient documentation

## 2015-09-25 DIAGNOSIS — Z79899 Other long term (current) drug therapy: Secondary | ICD-10-CM | POA: Diagnosis not present

## 2015-09-25 MED ORDER — TETANUS-DIPHTH-ACELL PERTUSSIS 5-2.5-18.5 LF-MCG/0.5 IM SUSP
0.5000 mL | Freq: Once | INTRAMUSCULAR | Status: AC
  Administered 2015-09-25: 0.5 mL via INTRAMUSCULAR
  Filled 2015-09-25: qty 0.5

## 2015-09-25 NOTE — ED Provider Notes (Signed)
CSN: [id number]     Arrival date & time 09/25/15  1703 History   First MD Initiated Contact with Patient 09/25/15 1839     Chief Complaint  Patient presents with  . Fall    Clayton [name] is a 82 y.o. male Presents to the emergency department after a trip and fall while gardening today. The patient reports he tripped over a brick paver and fell onto his head and nose. He denies loss of consciousness. He complains of pain to his nose and his neck. He also reports pain to his right third finger and his left knee. He has taken nothing for treatment before arrival today. He had a nosebleed prior to arrival that has resolved. The patient is unsure when his last tetanus shot was. The patient denies fevers, headache, dizziness, changes to his vision, numbness, tingling, weakness, abdominal pain, nausea, vomiting, diarrhea, chest pain, shortness of breath, or syncope.  Patient is a 82 y.o. male presenting with fall. The history is provided by the patient and a relative. No language interpreter was used.  Fall Associated symptoms include arthralgias and neck pain. Pertinent negatives include no abdominal pain, chest pain, congestion, coughing, fever, headaches, nausea, numbness, rash, sore throat, vomiting or weakness.    Past Medical History  Diagnosis Date  . Diabetes mellitus   . Hypertension   . Anemia   . Chronic renal insufficiency   . Osteoporosis   . Neuromuscular disorder ([place])   . DDD (degenerative disc disease), lumbar    Past Surgical History  Procedure Laterality Date  . Vasectomy    . Eye surgery    . Video bronchoscopy Bilateral 08/25/2012    Procedure: VIDEO BRONCHOSCOPY WITHOUT FLUORO;  Surgeon: Brand Males, MD;  Location: [facility];  Service: Cardiopulmonary;  Laterality: Bilateral;   Family History  Problem Relation Age of Onset  . Diabetes Mellitus II    . Hypertension     Social History  Substance Use Topics  . Smoking status: Former Smoker -- 3.00 packs/day  for 33 years    Types: Cigarettes    Quit date: 06/19/1973  . Smokeless tobacco: Former Systems developer    Quit date: 09/26/1975  . Alcohol Use: No    Review of Systems  Constitutional: Negative for fever.  HENT: Positive for nosebleeds. Negative for congestion, ear discharge, ear pain, sore throat and trouble swallowing.   Eyes: Negative for visual disturbance.  Respiratory: Negative for cough and shortness of breath.   Cardiovascular: Negative for chest pain.  Gastrointestinal: Negative for nausea, vomiting, abdominal pain and diarrhea.  Genitourinary: Negative for dysuria and difficulty urinating.  Musculoskeletal: Positive for arthralgias and neck pain. Negative for back pain.  Skin: Positive for wound. Negative for rash.  Neurological: Negative for dizziness, syncope, speech difficulty, weakness, light-headedness, numbness and headaches.      Allergies  Penicillins and Diltiazem  Home Medications   Prior to Admission medications   Medication Sig Start Date End Date Taking? Authorizing Provider  ACCU-CHEK SOFTCLIX LANCETS lancets TEST AS INSTRUCTED TO OBTAIN A BLOOD SPECIMEN 4 TIMES DAILY 08/30/15   Elayne Snare, MD  Alcohol Swabs (ALCOHOL PREP) PADS  04/08/15   Historical Provider, MD  Alcohol Swabs (ALCOHOL WIPES) 70 % PADS Use 7 pads per day 11/12/13   Elayne Snare, MD  BD INSULIN SYRINGE ULTRAFINE 31G X 5/16" 0.3 ML MISC USE THREE DAILY 07/06/15   Elayne Snare, MD  Blood Glucose Calibration (ACCU-CHEK AVIVA) SOLN Use as directed 10/09/13   Elayne Snare,  MD  Blood Glucose Monitoring Suppl (ACCU-CHEK AVIVA PLUS) W/DEVICE KIT Use to check blood sugars 4 times per day. Dx code: E11.9 08/03/14   Elayne Snare, MD  budesonide-formoterol [facility]) 160-4.5 MCG/ACT inhaler Inhale 2 puffs into the lungs 2 (two) times daily. 07/29/13   Tanda Rockers, MD  chlorpheniramine (CHLOR-TRIMETON) 4 MG tablet 2 tablets by mouth at bedtime. 1 extra in am daily as needed.    Historical Provider, MD  Cholecalciferol  (VITAMIN D3) 2000 UNITS TABS Take 1 capsule by mouth every morning.    Historical Provider, MD  dextromethorphan-guaiFENesin [facility] DM) 30-600 MG per 12 hr tablet 1-2 every 12 hours as needed for cough (with flutter valve) 06/24/13   Tanda Rockers, MD  Ferrous Sulfate 27 MG TABS Take 1 tablet by mouth daily.    Historical Provider, MD  FIBER PO 1 every am and 1 at bedtime    Historical Provider, MD  finasteride (PROSCAR) 5 MG tablet TAKE 1 TABLET EVERY DAY 09/19/15   Elayne Snare, MD  furosemide (LASIX) 20 MG tablet  06/24/14   Historical Provider, MD  glucose blood (ACCU-CHEK AVIVA PLUS) test strip USE AS INSTRUCTED TO CHECK BLOOD SUGAR FOUR TIMES DAILY. DX: E11.21 04/19/15   Elayne Snare, MD  insulin aspart (NOVOLOG) 100 UNIT/ML injection Inject 16 units three times a day with meals 08/04/15   Elayne Snare, MD  insulin lispro (HUMALOG) 100 UNIT/ML injection Inject 0.21 mLs (21 Units total) into the skin 3 (three) times daily with meals. Sliding scale with each meal Patient taking differently: Inject 16 Units into the skin 3 (three) times daily with meals. Sliding scale with each meal 10/01/14   Elayne Snare, MD  LANTUS 100 UNIT/ML injection INJECT 24 UNITS SUBCUTANEOUSLY IN THE MORNING AND 20 UNITS IN THE EVENING Patient taking differently: INJECT 20 UNITS SUBCUTANEOUSLY IN THE MORNING AND 25 UNITS IN THE EVENING 10/28/14   Elayne Snare, MD  montelukast (SINGULAIR) 10 MG tablet TAKE 1 TABLET BY MOUTH EVERY NIGHT AT BEDTIME 03/22/14   Tanda Rockers, MD  Multiple Vitamin (MULTIVITAMIN WITH MINERALS) TABS tablet Take 1 tablet by mouth daily.    Historical Provider, MD  omeprazole (PRILOSEC) 20 MG capsule  02/21/15   Historical Provider, MD  pantoprazole (PROTONIX) 40 MG tablet TAKE 1 TABLET BY MOUTH EVERY DAY 30 TO 60 MINUTES PRIOR TO FIRST MEAL OF THE DAY 05/17/14   Tanda Rockers, MD  PROAIR HFA 108 [phone] BASE) MCG/ACT inhaler  01/15/14   Historical Provider, MD  Respiratory Therapy Supplies (FLUTTER) DEVI Use as  directed. 03/18/13   Melvenia Needles, NP  simvastatin (ZOCOR) 20 MG tablet TAKE 1 TABLET EVERY DAY 09/19/15   Elayne Snare, MD  SYMBICORT 160-4.5 MCG/ACT inhaler  04/14/14   Historical Provider, MD  TOVIAZ 8 MG TB24 tablet  02/15/15   Historical Provider, MD  zolpidem (AMBIEN CR) 6.25 MG CR tablet  07/01/14   Historical Provider, MD  zolpidem (AMBIEN) 5 MG tablet Take 1 tablet (5 mg total) by mouth at bedtime as needed for sleep. 02/15/14   Elayne Snare, MD   BP 139/74 mmHg  Pulse 82  Temp(Src) 98.1 F (36.7 C) (Oral)  Resp 18  Ht 5' 10" (1.778 m)  Wt 106.595 kg  BMI 33.72 kg/m2  SpO2 98% Physical Exam  Constitutional: He appears well-developed and well-nourished. No distress.  Nontoxic appearing.  HENT:  Head: Normocephalic and atraumatic.  Right Ear: External ear normal.  Left Ear: External ear normal.  Mouth/Throat: Oropharynx is clear and moist.  Patient has a small abrasion to the bridge of his nose. Evidence of previous nosebleed. No active nosebleed. No septal hematoma. No nasal bone deformity. There is tenderness to the bridge of his nose. No other facial bone tenderness. EOMs intact without sign of entrapment. No broken teeth. No abrasions to his lips.   Eyes: Conjunctivae and EOM are normal. Pupils are equal, round, and reactive to light. Right eye exhibits no discharge. Left eye exhibits no discharge.  Neck: Normal range of motion. Neck supple. No JVD present. No tracheal deviation present.  No midline neck tenderness.  Cardiovascular: Normal rate, regular rhythm, normal heart sounds and intact distal pulses.  Exam reveals no gallop and no friction rub.   No murmur heard. Bilateral radial, posterior tibialis and dorsalis pedis pulses are intact.    Pulmonary/Chest: Effort normal. No respiratory distress. He has no wheezes. He exhibits no tenderness.  Symmetric chest expansion bilaterally. No chest wall tenderness.  Abdominal: Soft. There is no tenderness. There is no guarding.   Musculoskeletal: Normal range of motion. He exhibits tenderness. He exhibits no edema.  Patient has some tenderness to his left knee. No knee instability noted. Patient's bilateral shoulders, elbows, wrists, hips and ankle joints are supple and nontender to palpation. He has good strength in his bilateral upper and lower extremities. No midline neck or back tenderness.  Lymphadenopathy:    He has no cervical adenopathy.  Neurological: He is alert. Coordination normal.  Skin: Skin is warm and dry. No rash noted. He is not diaphoretic. No erythema. No pallor.  Abrasion noted to his left wrist. Patient also has an abrasion to his left knee.  Psychiatric: He has a normal mood and affect. His behavior is normal.  Nursing note and vitals reviewed.   ED Course  Procedures (including critical care time) Labs Review Labs Reviewed - No data to display  Imaging Review Ct Head Wo Contrast  09/25/2015  CLINICAL DATA:  Tripped and fell while gardening. Head hit brick paver. Contusions and abrasions about the nasal bridge, nose and left cheek. Epistaxis. Concern for cervical spine injury. Initial encounter. EXAM: CT HEAD WITHOUT CONTRAST CT CERVICAL SPINE WITHOUT CONTRAST TECHNIQUE: Multidetector CT imaging of the head and cervical spine was performed following the standard protocol without intravenous contrast. Multiplanar CT image reconstructions of the cervical spine were also generated. COMPARISON:  CT of the head performed 11/20/2004 FINDINGS: CT HEAD FINDINGS There is no evidence of acute infarction, mass lesion, or intra- or extra-axial hemorrhage on CT. Prominence of the ventricles and sulci reflects mild cortical volume loss. Mild cerebellar atrophy is noted. Mild subcortical white matter change may reflect small vessel ischemic microangiopathy. The brainstem and fourth ventricle are within normal limits. The basal ganglia are unremarkable in appearance. The cerebral hemispheres demonstrate grossly  normal gray-white differentiation. No mass effect or midline shift is seen. There is no evidence of fracture; visualized osseous structures are unremarkable in appearance. The orbits are within normal limits. The paranasal sinuses and mastoid air cells are well-aerated. No significant soft tissue abnormalities are seen. CT CERVICAL SPINE FINDINGS There is no evidence of acute fracture or subluxation. There is minimal grade 1 anterolisthesis of C3 on C4 and of C4 on C5. Multilevel disc space narrowing is noted along the lower cervical spine, with scattered anterior and posterior disc osteophyte complexes. Underlying facet disease is noted. Vertebral bodies demonstrate normal height. Prevertebral soft tissues are within normal limits. The thyroid gland is  unremarkable in appearance. The visualized lung apices are clear. No significant soft tissue abnormalities are seen. Tracheomalacia is noted. IMPRESSION: 1. No evidence of traumatic intracranial injury or fracture. 2. No evidence of fracture or subluxation along the cervical spine. 3. Mild cortical volume loss and scattered small vessel ischemic microangiopathy. 4. Mild degenerative change along the cervical spine. 5. Tracheomalacia noted. Electronically Signed   By: Garald Balding M.D.   On: 09/25/2015 20:18   Ct Cervical Spine Wo Contrast  09/25/2015  CLINICAL DATA:  Tripped and fell while gardening. Head hit brick paver. Contusions and abrasions about the nasal bridge, nose and left cheek. Epistaxis. Concern for cervical spine injury. Initial encounter. EXAM: CT HEAD WITHOUT CONTRAST CT CERVICAL SPINE WITHOUT CONTRAST TECHNIQUE: Multidetector CT imaging of the head and cervical spine was performed following the standard protocol without intravenous contrast. Multiplanar CT image reconstructions of the cervical spine were also generated. COMPARISON:  CT of the head performed 11/20/2004 FINDINGS: CT HEAD FINDINGS There is no evidence of acute infarction, mass  lesion, or intra- or extra-axial hemorrhage on CT. Prominence of the ventricles and sulci reflects mild cortical volume loss. Mild cerebellar atrophy is noted. Mild subcortical white matter change may reflect small vessel ischemic microangiopathy. The brainstem and fourth ventricle are within normal limits. The basal ganglia are unremarkable in appearance. The cerebral hemispheres demonstrate grossly normal gray-white differentiation. No mass effect or midline shift is seen. There is no evidence of fracture; visualized osseous structures are unremarkable in appearance. The orbits are within normal limits. The paranasal sinuses and mastoid air cells are well-aerated. No significant soft tissue abnormalities are seen. CT CERVICAL SPINE FINDINGS There is no evidence of acute fracture or subluxation. There is minimal grade 1 anterolisthesis of C3 on C4 and of C4 on C5. Multilevel disc space narrowing is noted along the lower cervical spine, with scattered anterior and posterior disc osteophyte complexes. Underlying facet disease is noted. Vertebral bodies demonstrate normal height. Prevertebral soft tissues are within normal limits. The thyroid gland is unremarkable in appearance. The visualized lung apices are clear. No significant soft tissue abnormalities are seen. Tracheomalacia is noted. IMPRESSION: 1. No evidence of traumatic intracranial injury or fracture. 2. No evidence of fracture or subluxation along the cervical spine. 3. Mild cortical volume loss and scattered small vessel ischemic microangiopathy. 4. Mild degenerative change along the cervical spine. 5. Tracheomalacia noted. Electronically Signed   By: Garald Balding M.D.   On: 09/25/2015 20:18   Dg Knee Complete 4 Views Left  09/25/2015  CLINICAL DATA:  Recent trip and fall with left knee pain, initial encounter EXAM: LEFT KNEE - COMPLETE 4+ VIEW COMPARISON:  None. FINDINGS: Mild degenerative changes are noted in the lateral joint space. No joint  effusion is seen. No acute fracture or dislocation is noted. Vascular calcifications are seen. IMPRESSION: Mild degenerative changes laterally without acute abnormality. Electronically Signed   By: Inez Catalina M.D.   On: 09/25/2015 20:36   Dg Hand Complete Right  09/25/2015  CLINICAL DATA:  Left knee pain, fall EXAM: RIGHT HAND - COMPLETE 3+ VIEW COMPARISON:  None. FINDINGS: There is no evidence of fracture or dislocation. There is severe osteoarthritis of the radiocarpal joint. There is moderate osteoarthritis of the first CMC joint. There is ulnar positive variance. There is generalized osteopenia. Soft tissues are unremarkable. IMPRESSION: No acute osseous injury of the right hand. Electronically Signed   By: Kathreen Devoid   On: 09/25/2015 20:42  EKG Interpretation None      Filed Vitals:   09/25/15 1714 09/25/15 2109  BP: 148/73 139/74  Pulse: 92 82  Temp: 98.1 F (36.7 C)   TempSrc: Oral   Resp:  18  Height: 5' 10" (1.778 m)   Weight: 106.595 kg   SpO2: 97% 98%     MDM   Meds given in ED:  Medications  Tdap (BOOSTRIX) injection 0.5 mL (0.5 mLs Intramuscular Given 09/25/15 2013)    New Prescriptions   No medications on file    Final diagnoses:  Fall, initial encounter  Neck pain  Epistaxis  Right hand pain  Left knee pain   This is a 82 y.o. male Presents to the emergency department after a trip and fall while gardening today. The patient reports he tripped over a brick paver and fell onto his head and nose. He denies loss of consciousness. He complains of pain to his nose and his neck. He also reports pain to his right third finger and his left knee. He has taken nothing for treatment before arrival today. He had a nosebleed prior to arrival that has resolved. The patient is unsure when his last tetanus shot was. On exam the patient is afebrile and nontoxic appearing. He has no focal neurological deficits. No septal hematoma. He does have evidence of a previous  nosebleed. No facial bone tenderness other than the bridge of his nose. Patient also has an abrasion to his left knee and left wrist. He also complains of some pain to his right third finger. Patient has good range of motion of all of his joints without pain. He is ambulating with normal gait. I discussed that I have low suspicion for head injury as he is not on anticoagulants, has no neurological deficits and no complaint of headache. Patient's daughter and patient is still concerned about head injury and we will elect to do a CT of his head and neck as well as x-rays of his hand and knee. Patient's tetanus is updated in the emergency department. Imaging in the emergency department is negative for acute injury. Will discharge with close follow-up by primary care. I discussed strict and specific return precautions related to head injury. I advised the patient to follow-up with their primary care provider this week. I advised the patient to return to the emergency department with new or worsening symptoms or new concerns. The patient verbalized understanding and agreement with plan.       Eligio Angert, PA-C 09/25/15 2130  Charlesetta Shanks, MD 09/29/15 [phone]

## 2015-09-25 NOTE — Discharge Instructions (Signed)
Fall Prevention in the Home  Falls can cause injuries and can affect people from all age groups. There are many simple things that you can do to make your home safe and to help prevent falls. WHAT CAN I DO ON THE OUTSIDE OF MY HOME?  Regularly repair the edges of walkways and driveways and fix any cracks.  Remove high doorway thresholds.  Trim any shrubbery on the main path into your home.  Use bright outdoor lighting.  Clear walkways of debris and clutter, including tools and rocks.  Regularly check that handrails are securely fastened and in good repair. Both sides of any steps should have handrails.  Install guardrails along the edges of any raised decks or porches.  Have leaves, snow, and ice cleared regularly.  Use sand or salt on walkways during winter months.  In the garage, clean up any spills right away, including grease or oil spills. WHAT CAN I DO IN THE BATHROOM?  Use night lights.  Install grab bars by the toilet and in the tub and shower. Do not use towel bars as grab bars.  Use non-skid mats or decals on the floor of the tub or shower.  If you need to sit down while you are in the shower, use a plastic, non-slip stool.[place] Kitchen  Keep the floor dry. Immediately clean up any water that spills on the floor.  Remove soap buildup in the tub or shower on a regular basis.  Attach bath mats securely with double-sided non-slip rug tape.  Remove throw rugs and other tripping hazards from the floor. WHAT CAN I DO IN THE BEDROOM?  Use night lights.  Make sure that a bedside light is easy to reach.  Do not use oversized bedding that drapes onto the floor.  Have a firm chair that has side arms to use for getting dressed.  Remove throw rugs and other tripping hazards from the floor. WHAT CAN I DO IN THE KITCHEN?   Clean up any spills right away.  Avoid walking on wet floors.  Place frequently used items in easy-to-reach places.  If you need to reach for something  above you, use a sturdy step stool that has a grab bar.  Keep electrical cables out of the way.  Do not use floor polish or wax that makes floors slippery. If you have to use wax, make sure that it is non-skid floor wax.  Remove throw rugs and other tripping hazards from the floor. WHAT CAN I DO IN THE STAIRWAYS?  Do not leave any items on the stairs.  Make sure that there are handrails on both sides of the stairs. Fix handrails that are broken or loose. Make sure that handrails are as long as the stairways.  Check any carpeting to make sure that it is firmly attached to the stairs. Fix any carpet that is loose or worn.  Avoid having throw rugs at the top or bottom of stairways, or secure the rugs with carpet tape to prevent them from moving.  Make sure that you have a light switch at the top of the stairs and the bottom of the stairs. If you do not have them, have them installed. WHAT ARE SOME OTHER FALL PREVENTION TIPS?  Wear closed-toe shoes that fit well and support your feet. Wear shoes that have rubber soles or low heels.  When you use a stepladder, make sure that it is completely opened and that the sides are firmly locked. Have someone hold the ladder while you  are using it. Do not climb a closed stepladder.  Add color or contrast paint or tape to grab bars and handrails in your home. Place contrasting color strips on the first and last steps.  Use mobility aids as needed, such as canes, walkers, scooters, and crutches.  Turn on lights if it is dark. Replace any light bulbs that burn out.  Set up furniture so that there are clear paths. Keep the furniture in the same spot.  Fix any uneven floor surfaces.  Choose a carpet design that does not hide the edge of steps of a stairway.  Be aware of any and all pets.  Review your medicines with your healthcare provider. Some medicines can cause dizziness or changes in blood pressure, which increase your risk of falling. Talk  with your health care provider about other ways that you can decrease your risk of falls. This may include working with a physical therapist or trainer to improve your strength, balance, and endurance.   This information is not intended to replace advice given to you by your health care provider. Make sure you discuss any questions you have with your health care provider.   Document Released: 05/25/2002 Document Revised: 10/19/2014 Document Reviewed: 07/09/2014 Elsevier Interactive Patient Education 2016 Elsevier [employer] Facial Laceration/abrasion   A facial laceration is a cut on the face. These injuries can be painful and cause bleeding. Lacerations usually heal quickly, but they need special care to reduce scarring. DIAGNOSIS  Your health care provider will take a medical history, ask for details about how the injury occurred, and examine the wound to determine how deep the cut is. TREATMENT  Some facial lacerations may not require closure. Others may not be able to be closed because of an increased risk of infection. The risk of infection and the chance for successful closure will depend on various factors, including the amount of time since the injury occurred. The wound may be cleaned to help prevent infection. If closure is appropriate, pain medicines may be given if needed. Your health care provider will use stitches (sutures), wound glue (adhesive), or skin adhesive strips to repair the laceration. These tools bring the skin edges together to allow for faster healing and a better cosmetic outcome. If needed, you may also be given a tetanus shot. HOME CARE INSTRUCTIONS  Only take over-the-counter or prescription medicines as directed by your health care provider.  Follow your health care provider's instructions for wound care. These instructions will vary depending on the technique used for closing the wound. For Sutures:  Keep the wound clean and dry.   If you were given a bandage  (dressing), you should change it at least once a day. Also change the dressing if it becomes wet or dirty, or as directed by your health care provider.   Wash the wound with soap and water 2 times a day. Rinse the wound off with water to remove all soap. Pat the wound dry with a clean towel.   After cleaning, apply a thin layer of the antibiotic ointment recommended by your health care provider. This will help prevent infection and keep the dressing from sticking.   You may shower as usual after the first 24 hours. Do not soak the wound in water until the sutures are removed.   Get your sutures removed as directed by your health care provider. With facial lacerations, sutures should usually be taken out after 4-5 days to avoid stitch marks.   Wait a few days  after your sutures are removed before applying any makeup. For Skin Adhesive Strips:  Keep the wound clean and dry.   Do not get the skin adhesive strips wet. You may bathe carefully, using caution to keep the wound dry.   If the wound gets wet, pat it dry with a clean towel.   Skin adhesive strips will fall off on their own. You may trim the strips as the wound heals. Do not remove skin adhesive strips that are still stuck to the wound. They will fall off in time.  For Wound Adhesive:  You may briefly wet your wound in the shower or bath. Do not soak or scrub the wound. Do not swim. Avoid periods of heavy sweating until the skin adhesive has fallen off on its own. After showering or bathing, gently pat the wound dry with a clean towel.   Do not apply liquid medicine, cream medicine, ointment medicine, or makeup to your wound while the skin adhesive is in place. This may loosen the film before your wound is healed.   If a dressing is placed over the wound, be careful not to apply tape directly over the skin adhesive. This may cause the adhesive to be pulled off before the wound is healed.   Avoid prolonged exposure to  sunlight or tanning lamps while the skin adhesive is in place.  The skin adhesive will usually remain in place for 5-10 days, then naturally fall off the skin. Do not pick at the adhesive film.  After Healing: Once the wound has healed, cover the wound with sunscreen during the day for 1 full year. This can help minimize scarring. Exposure to ultraviolet light in the first year will darken the scar. It can take 1-2 years for the scar to lose its redness and to heal completely.  SEEK MEDICAL CARE IF:  You have a fever. SEEK IMMEDIATE MEDICAL CARE IF:  You have redness, pain, or swelling around the wound.   You see ayellowish-white fluid (pus) coming from the wound.    This information is not intended to replace advice given to you by your health care provider. Make sure you discuss any questions you have with your health care provider.   Document Released: 07/12/2004 Document Revised: 06/25/2014 Document Reviewed: 01/15/2013 Elsevier Interactive Patient Education [employer].

## 2015-09-25 NOTE — ED Notes (Signed)
Trip and fall while gardening ~4pm today. States he was carrying a cane in one hand and a bucket in another when he 'missed a step' and fell. Head hit a brick paver. +contusions/abrasions to nasal bridge, nose, and left cheek. +nose bleed prior to arrival. Lip abrasions. +skin tear to left wrist.   Denies LOC, immediately ambulatory afterwards. Denies dizziness, lightheaded, chest pain, shortness of breath pre or post fall. Denies blood thinners.  Last Tetanus 2011

## 2015-09-28 DIAGNOSIS — J449 Chronic obstructive pulmonary disease, unspecified: Secondary | ICD-10-CM | POA: Diagnosis not present

## 2015-09-28 DIAGNOSIS — M47814 Spondylosis without myelopathy or radiculopathy, thoracic region: Secondary | ICD-10-CM | POA: Diagnosis not present

## 2015-09-28 DIAGNOSIS — R0902 Hypoxemia: Secondary | ICD-10-CM | POA: Diagnosis not present

## 2015-09-28 DIAGNOSIS — J181 Lobar pneumonia, unspecified organism: Secondary | ICD-10-CM | POA: Diagnosis not present

## 2015-09-28 DIAGNOSIS — I2510 Atherosclerotic heart disease of native coronary artery without angina pectoris: Secondary | ICD-10-CM | POA: Diagnosis not present

## 2015-09-28 DIAGNOSIS — R911 Solitary pulmonary nodule: Secondary | ICD-10-CM | POA: Diagnosis not present

## 2015-09-28 DIAGNOSIS — G4733 Obstructive sleep apnea (adult) (pediatric): Secondary | ICD-10-CM | POA: Diagnosis not present

## 2015-09-28 DIAGNOSIS — J984 Other disorders of lung: Secondary | ICD-10-CM | POA: Diagnosis not present

## 2015-09-28 DIAGNOSIS — I700 Atherosclerosis of aorta: Secondary | ICD-10-CM | POA: Diagnosis not present

## 2015-09-29 ENCOUNTER — Other Ambulatory Visit: Payer: Self-pay | Admitting: Endocrinology

## 2015-10-03 DIAGNOSIS — R0902 Hypoxemia: Secondary | ICD-10-CM | POA: Diagnosis not present

## 2015-10-03 DIAGNOSIS — J14 Pneumonia due to Hemophilus influenzae: Secondary | ICD-10-CM | POA: Diagnosis not present

## 2015-10-03 DIAGNOSIS — J441 Chronic obstructive pulmonary disease with (acute) exacerbation: Secondary | ICD-10-CM | POA: Diagnosis not present

## 2015-10-04 ENCOUNTER — Telehealth: Payer: Self-pay | Admitting: Endocrinology

## 2015-10-04 NOTE — Telephone Encounter (Signed)
I spoke with Clayton [name]. He wants you to know that he has pneumonia right now but is on antibiotics. He also said that he doesn't feel like he needs to see a kidney Dr. Any longer, please advise.

## 2015-10-04 NOTE — Telephone Encounter (Signed)
Patient state Dr Tinnie Gens office over at [facility], is not doing anything for him, he would like to talk to you about it.

## 2015-10-05 NOTE — Telephone Encounter (Signed)
We can follow his kidney function here if he does not want to see the nephrologist

## 2015-10-06 NOTE — Telephone Encounter (Signed)
Noted, patient is aware. 

## 2015-10-19 ENCOUNTER — Other Ambulatory Visit (INDEPENDENT_AMBULATORY_CARE_PROVIDER_SITE_OTHER): Payer: Medicare PPO

## 2015-10-19 DIAGNOSIS — Z794 Long term (current) use of insulin: Secondary | ICD-10-CM

## 2015-10-19 DIAGNOSIS — N184 Chronic kidney disease, stage 4 (severe): Secondary | ICD-10-CM

## 2015-10-19 DIAGNOSIS — E1121 Type 2 diabetes mellitus with diabetic nephropathy: Secondary | ICD-10-CM | POA: Diagnosis not present

## 2015-10-19 LAB — COMPREHENSIVE METABOLIC PANEL
ALT: 14 U/L (ref 0–53)
AST: 18 U/L (ref 0–37)
Albumin: 3.8 g/dL (ref 3.5–5.2)
Alkaline Phosphatase: 79 U/L (ref 39–117)
BUN: 43 mg/dL — AB (ref 6–23)
CALCIUM: 9.3 mg/dL (ref 8.4–10.5)
CHLORIDE: 105 meq/L (ref 96–112)
CO2: 26 meq/L (ref 19–32)
CREATININE: 2.62 mg/dL — AB (ref 0.40–1.50)
GFR: 24.8 mL/min — ABNORMAL LOW (ref >60.00)
Glucose, Bld: 139 mg/dL — ABNORMAL HIGH (ref 70–99)
Potassium: 4.8 mEq/L (ref 3.5–5.1)
SODIUM: 137 meq/L (ref 135–145)
Total Bilirubin: 0.3 mg/dL (ref 0.2–1.2)
Total Protein: 7.3 g/dL (ref 6.0–8.3)

## 2015-10-19 LAB — HEMOGLOBIN A1C: HEMOGLOBIN A1C: 7 % — AB (ref 4.6–6.5)

## 2015-10-19 LAB — TSH: TSH: 3.33 u[IU]/mL (ref 0.35–4.50)

## 2015-10-20 DIAGNOSIS — J449 Chronic obstructive pulmonary disease, unspecified: Secondary | ICD-10-CM | POA: Diagnosis not present

## 2015-10-20 LAB — PARATHYROID HORMONE, INTACT (NO CA): PTH: 63 pg/mL (ref 15–65)

## 2015-10-21 ENCOUNTER — Encounter: Payer: Self-pay | Admitting: Endocrinology

## 2015-10-21 ENCOUNTER — Ambulatory Visit (INDEPENDENT_AMBULATORY_CARE_PROVIDER_SITE_OTHER): Payer: Medicare PPO | Admitting: Endocrinology

## 2015-10-21 VITALS — BP 134/72 | HR 100 | Temp 98.1°F | Ht 70.5 in | Wt 242.0 lb

## 2015-10-21 DIAGNOSIS — Z794 Long term (current) use of insulin: Secondary | ICD-10-CM | POA: Diagnosis not present

## 2015-10-21 DIAGNOSIS — E1165 Type 2 diabetes mellitus with hyperglycemia: Secondary | ICD-10-CM | POA: Diagnosis not present

## 2015-10-21 DIAGNOSIS — N184 Chronic kidney disease, stage 4 (severe): Secondary | ICD-10-CM

## 2015-10-21 DIAGNOSIS — J449 Chronic obstructive pulmonary disease, unspecified: Secondary | ICD-10-CM | POA: Diagnosis not present

## 2015-10-21 NOTE — Progress Notes (Signed)
Patient ID: Clayton [name], male   DOB: 1927/11/28, 82 y.o.   MRN: [id number]   Reason for Appointment: Followup of diabetes and various issues  History of Present Illness   Type 2 DIABETES MELITUS   He has had long-standing diabetes and has been on basal bolus insulin for the last few years Since 2014 he has required larger doses of insulin especially with having to take periodic courses of steroids for his pulmonary problems Also his Actos was stopped because of tendency to edema and metformin stopped because of renal dysfunction  Insulin regimen: Novolog 18 units before meals, Lantus  20-25  a day.  A1c is the same at 7.0%, previously 7.1 on the has been better before  Generally compliant with checking his blood sugar at least 3 times a day on average, however usually not after evening meal   Current blood sugar patterns and problems identified:  His fasting blood sugars are fairly good overall with some fluctuation.  He had high readings fasting and throughout the day when he was having a respiratory illness last month   His lowest blood sugars are around lunchtime especially recently with only occasional hypoglycemia  Blood sugars at suppertime are markedly variable and only related to his diet  Despite reminders he does not check his sugars after supper  He is taking the same amount of insulin for all his meals despite variable intake.  He takes 17 units even when he is just eating oatmeal and peanut butter in the morning  Also not able to lose any weight, has gained some since his last visit  Not able to do any exercise  Hypoglycemia: Only once recently as above   Oral hypoglycemic drugs: None           Monitors blood glucose:  3-4 times a day   Glucometer: Accu-Chek        Glucose  monitor download shows the following results more recently Average reading are for the last 4 weeks  Mean values apply above for all meters except median for One  Touch  PRE-MEAL Fasting Lunch Dinner Overnight  Overall  Glucose range: 69-208  50-259  78-423     Mean/median: 157  139  185  141  156   Meals: 3 meals per day.  breakfast: oatmeal / grits, PB Dinner usually at 6:30 pm       Physical activity: exercise:   Weight history:  Wt Readings from Last 3 Encounters:  10/21/15 242 lb (109.77 kg)  09/25/15 235 lb (106.595 kg)  07/22/15 235 lb (106.595 kg)               Complications are: Nephropathy. Microalbumin level now consistently normal  LABS:  Lab Results  Component Value Date   HGBA1C 7.0* 10/19/2015   HGBA1C 7.1* 07/19/2015   HGBA1C 6.3 04/19/2015   Lab Results  Component Value Date   MICROALBUR 11.7* 07/19/2015   LDLCALC 59 01/19/2015   CREATININE 2.62* 10/19/2015     Problem 2:  RENAL insufficiency: He has had chronic increase in creatinine, was 1.7 in 2014. This has been related to nephropathy and glomerulosclerosis ACE inhibitor/ARB recommended By nephrologist but not done because of the history of patient getting hyperkalemia with these drugs in the past Renal ultrasound did not show any abnormality  Creatinine is fluctuating, relatively lower in 1/17 Lasix was reduced to every other day on his last visit  He has a normal PTH now, not on calcitriol  and only on vitamin D   Lab Results  Component Value Date   CREATININE 2.62* 10/19/2015   CREATININE 2.41* 07/19/2015   CREATININE 2.55* 04/19/2015   CREATININE 2.68* 01/19/2015    Proteinuria: He was given diltiazem previously For his proteinuria  when blood pressure was relatively high  but he claims that it was causing itching on his abdominal area and he stopped it      Medication List       This list is accurate as of: 10/21/15 11:59 PM.  Always use your most recent med list.               ACCU-CHEK AVIVA PLUS w/Device Kit  CHECK BLOOD SUGAR FOUR TIMES DAILY     ACCU-CHEK AVIVA Soln  Use as directed     ACCU-CHEK SOFTCLIX LANCETS lancets   TEST AS INSTRUCTED TO OBTAIN A BLOOD SPECIMEN 4 TIMES DAILY     Alcohol Wipes 70 % Pads  Use 7 pads per day     Alcohol Prep Pads     BD INSULIN SYRINGE ULTRAFINE 31G X 5/16" 0.3 ML Misc  Generic drug:  Insulin Syringe-Needle U-100  USE THREE DAILY     budesonide-formoterol 160-4.5 MCG/ACT inhaler  Commonly known as:  SYMBICORT  Inhale 2 puffs into the lungs 2 (two) times daily.     chlorpheniramine 4 MG tablet  Commonly known as:  CHLOR-TRIMETON  2 tablets by mouth at bedtime. 1 extra in am daily as needed.     dextromethorphan-guaiFENesin 30-600 MG 12hr tablet  Commonly known as:  MUCINEX DM  Reported on 10/21/2015     Ferrous Sulfate 27 MG Tabs  Take 1 tablet by mouth daily.     FIBER PO  Reported on 10/21/2015     finasteride 5 MG tablet  Commonly known as:  PROSCAR  TAKE 1 TABLET EVERY DAY     FLUTTER Devi  Use as directed.     furosemide 20 MG tablet  Commonly known as:  LASIX  Taking 4 times per week.     glucose blood test strip  Commonly known as:  ACCU-CHEK AVIVA PLUS  USE AS INSTRUCTED TO CHECK BLOOD SUGAR FOUR TIMES DAILY. DX: E11.21     insulin aspart 100 UNIT/ML injection  Commonly known as:  novoLOG  Inject 16 units three times a day with meals     insulin lispro 100 UNIT/ML injection  Commonly known as:  HUMALOG  Inject 0.21 mLs (21 Units total) into the skin 3 (three) times daily with meals. Sliding scale with each meal     LANTUS 100 UNIT/ML injection  Generic drug:  insulin glargine  INJECT 24 UNITS SUBCUTANEOUSLY IN THE MORNING AND 20 UNITS IN THE EVENING     montelukast 10 MG tablet  Commonly known as:  SINGULAIR  TAKE 1 TABLET BY MOUTH EVERY NIGHT AT BEDTIME     multivitamin with minerals Tabs tablet  Take 1 tablet by mouth daily.     omeprazole 20 MG capsule  Commonly known as:  PRILOSEC     pantoprazole 40 MG tablet  Commonly known as:  PROTONIX  TAKE 1 TABLET BY MOUTH EVERY DAY 30 TO 60 MINUTES PRIOR TO FIRST MEAL OF THE DAY      PROAIR HFA 108 (90 Base) MCG/ACT inhaler  Generic drug:  albuterol     simvastatin 20 MG tablet  Commonly known as:  ZOCOR  TAKE 1 TABLET EVERY DAY     TOVIAZ  8 MG Tb24 tablet  Generic drug:  fesoterodine     Vitamin D3 2000 units Tabs  Take 1 capsule by mouth every morning.     zolpidem 6.25 MG CR tablet  Commonly known as:  AMBIEN CR  Reported on 10/21/2015        Allergies:  Allergies  Allergen Reactions  . Penicillins   . Diltiazem Rash    Past Medical History  Diagnosis Date  . Diabetes mellitus   . Hypertension   . Anemia   . Chronic renal insufficiency   . Osteoporosis   . Neuromuscular disorder ([place])   . DDD (degenerative disc disease), lumbar     Past Surgical History  Procedure Laterality Date  . Vasectomy    . Eye surgery    . Video bronchoscopy Bilateral 08/25/2012    Procedure: VIDEO BRONCHOSCOPY WITHOUT FLUORO;  Surgeon: Brand Males, MD;  Location: [place] [facility];  Service: Cardiopulmonary;  Laterality: Bilateral;    Family History  Problem Relation Age of Onset  . Diabetes Mellitus II    . Hypertension      Social History:  reports that he quit smoking about 42 years ago. His smoking use included Cigarettes. He has a 99 pack-year smoking history. He quit smokeless tobacco use about 40 years ago. He reports that he does not drink alcohol or use illicit drugs.  Review of Systems -   HYPERTENSION: This has been present in the past.  Currently not on any medication with good control consistently  Previous history of anemia related to renal dysfunction, recent hemoglobin is stable   Lab Results  Component Value Date   WBC 10.8* 07/19/2015   HGB 12.1* 07/19/2015   HCT 37.0* 07/19/2015   MCV 95.0 07/19/2015   PLT 263.0 07/19/2015     He has been given CPAP and this is being adjusted by pulmonologist.    Edema: Mostly on the left leg previously, now not a problem Taking 20 mg Lasix, qod He did not like wearing the elastic  stockings prescribed previously  He has had chronic lower urinary tract problems and BPH.  Prescriptions are being done by his urologist  HYPERLIPIDEMIA:         The lipid abnormality consists of  minimally elevated LDL, taking simvastatin for cardiovascular protection.  Has good levels although LDL low normal   Lab Results  Component Value Date   CHOL 119 01/19/2015   HDL 41.50 01/19/2015   LDLCALC 59 01/19/2015   TRIG 90.0 01/19/2015   CHOLHDL 3 01/19/2015     Diabetic foot exam results in 8/16: nearly normal monofilament sensation in the toes and plantar surfaces, no skin lesions or ulcers on the feet and normal pedal pulses   Examination:   BP 134/72 mmHg  Pulse 100  Temp(Src) 98.1 F (36.7 C) (Oral)  Ht 5' 10.5" (1.791 m)  Wt 242 lb (109.77 kg)  BMI 34.22 kg/m2  SpO2 94%  Body mass index is 34.22 kg/(m^2).   No ankle edema    Assesment/PLAN:   1. Diabetes type 2  The patient's diabetes control is overall reasonably well controlledFor his age with basal bolus insulin regimen. See history of present illness for details Again his blood sugars are variable especially when he has intercurrent illness and also suppertime readings based on his diet Lowest blood sugars are at lunch He does not adjust his mealtime doses based on what he is eating and takes the same dose for all meals Despite reminders he  does not check readings after supper, discussed again today  For now will not change his basic insulin regimen except reduce morning coverage by 2 units He can have 2-4 units more for higher carbohydrate or large meals No change in Lantus  2.   Chronic kidney disease: His creatinine is overall high but is  fluctuating Will check his PTH periodically, currently normal range Microalbumin is norma as of 1/17 He does not want to follow-up with nephrologist since he is seeing multiple physicians  3.  Hypertension history: Blood pressure is normal without any medication    4.  History of mild anemia, stable This is related to chronic kidney disease  5.  Left leg edema is controlled with low-dose Lasix  6.  Respiratory symptoms: Now controlled without excessive cough  Patient Instructions  15 Novolog at [facility] and stay on 17 at lunch and supper  More sugars after supper    Counseling time on subjects discussed above is over 50% of today's 25 minute visit   Clayton [name]  10/22/2015, 4:29 PM

## 2015-10-21 NOTE — Patient Instructions (Signed)
15 Novolog at [facility] and stay on 17 at lunch and supper  More sugars after supper

## 2015-11-01 DIAGNOSIS — R1310 Dysphagia, unspecified: Secondary | ICD-10-CM | POA: Diagnosis not present

## 2015-11-01 DIAGNOSIS — J690 Pneumonitis due to inhalation of food and vomit: Secondary | ICD-10-CM | POA: Diagnosis not present

## 2015-11-02 DIAGNOSIS — J181 Lobar pneumonia, unspecified organism: Secondary | ICD-10-CM | POA: Diagnosis not present

## 2015-11-09 DIAGNOSIS — R0902 Hypoxemia: Secondary | ICD-10-CM | POA: Diagnosis not present

## 2015-11-09 DIAGNOSIS — R05 Cough: Secondary | ICD-10-CM | POA: Diagnosis not present

## 2015-11-09 DIAGNOSIS — J14 Pneumonia due to Hemophilus influenzae: Secondary | ICD-10-CM | POA: Diagnosis not present

## 2015-11-09 DIAGNOSIS — R911 Solitary pulmonary nodule: Secondary | ICD-10-CM | POA: Diagnosis not present

## 2015-11-09 DIAGNOSIS — J449 Chronic obstructive pulmonary disease, unspecified: Secondary | ICD-10-CM | POA: Diagnosis not present

## 2015-11-09 DIAGNOSIS — Z720 Tobacco use: Secondary | ICD-10-CM | POA: Diagnosis not present

## 2015-11-09 DIAGNOSIS — G4733 Obstructive sleep apnea (adult) (pediatric): Secondary | ICD-10-CM | POA: Diagnosis not present

## 2015-11-28 ENCOUNTER — Telehealth: Payer: Self-pay | Admitting: Endocrinology

## 2015-11-28 MED ORDER — INSULIN ASPART 100 UNIT/ML ~~LOC~~ SOLN: SUBCUTANEOUS | Status: DC

## 2015-11-28 MED ORDER — INSULIN GLARGINE 100 UNIT/ML ~~LOC~~ SOLN: SUBCUTANEOUS | Status: DC

## 2015-11-28 NOTE — Telephone Encounter (Signed)
Rx submitted per pt's request.  

## 2015-11-28 NOTE — Telephone Encounter (Signed)
Pt called, said he needs his Novolog and Lantus refilled to [facility]

## 2015-12-05 ENCOUNTER — Telehealth: Payer: Self-pay | Admitting: Endocrinology

## 2015-12-05 NOTE — Telephone Encounter (Signed)
Patient notified says ok.

## 2015-12-05 NOTE — Telephone Encounter (Signed)
Likely scar tissue.  If it is not painful nothing needs to be done

## 2015-12-05 NOTE — Telephone Encounter (Signed)
Pt needs call back regarding growth as thick as a quarter and as big around as a eraser on a pencil on his arm where his blood was taken

## 2015-12-08 DIAGNOSIS — R911 Solitary pulmonary nodule: Secondary | ICD-10-CM | POA: Diagnosis not present

## 2015-12-12 ENCOUNTER — Other Ambulatory Visit: Payer: Self-pay | Admitting: Endocrinology

## 2015-12-29 ENCOUNTER — Other Ambulatory Visit: Payer: Self-pay | Admitting: Endocrinology

## 2015-12-30 DIAGNOSIS — G4733 Obstructive sleep apnea (adult) (pediatric): Secondary | ICD-10-CM | POA: Diagnosis not present

## 2016-01-10 DIAGNOSIS — D225 Melanocytic nevi of trunk: Secondary | ICD-10-CM | POA: Diagnosis not present

## 2016-01-10 DIAGNOSIS — X32XXXD Exposure to sunlight, subsequent encounter: Secondary | ICD-10-CM | POA: Diagnosis not present

## 2016-01-10 DIAGNOSIS — L570 Actinic keratosis: Secondary | ICD-10-CM | POA: Diagnosis not present

## 2016-01-11 DIAGNOSIS — R911 Solitary pulmonary nodule: Secondary | ICD-10-CM | POA: Diagnosis not present

## 2016-01-11 DIAGNOSIS — G4733 Obstructive sleep apnea (adult) (pediatric): Secondary | ICD-10-CM | POA: Diagnosis not present

## 2016-01-11 DIAGNOSIS — J449 Chronic obstructive pulmonary disease, unspecified: Secondary | ICD-10-CM | POA: Diagnosis not present

## 2016-01-11 DIAGNOSIS — R0902 Hypoxemia: Secondary | ICD-10-CM | POA: Diagnosis not present

## 2016-01-18 ENCOUNTER — Encounter: Payer: Self-pay | Admitting: Endocrinology

## 2016-01-18 DIAGNOSIS — Z961 Presence of intraocular lens: Secondary | ICD-10-CM | POA: Diagnosis not present

## 2016-01-18 DIAGNOSIS — H43813 Vitreous degeneration, bilateral: Secondary | ICD-10-CM | POA: Diagnosis not present

## 2016-01-18 DIAGNOSIS — H524 Presbyopia: Secondary | ICD-10-CM | POA: Diagnosis not present

## 2016-01-18 DIAGNOSIS — E113391 Type 2 diabetes mellitus with moderate nonproliferative diabetic retinopathy without macular edema, right eye: Secondary | ICD-10-CM | POA: Diagnosis not present

## 2016-01-18 LAB — HM DIABETES EYE EXAM

## 2016-01-19 ENCOUNTER — Other Ambulatory Visit: Payer: Self-pay | Admitting: Endocrinology

## 2016-01-25 ENCOUNTER — Other Ambulatory Visit: Payer: Medicare PPO

## 2016-01-25 ENCOUNTER — Other Ambulatory Visit (INDEPENDENT_AMBULATORY_CARE_PROVIDER_SITE_OTHER): Payer: Medicare PPO

## 2016-01-25 DIAGNOSIS — N184 Chronic kidney disease, stage 4 (severe): Secondary | ICD-10-CM

## 2016-01-25 DIAGNOSIS — Z794 Long term (current) use of insulin: Secondary | ICD-10-CM

## 2016-01-25 DIAGNOSIS — E1165 Type 2 diabetes mellitus with hyperglycemia: Secondary | ICD-10-CM

## 2016-01-25 LAB — COMPREHENSIVE METABOLIC PANEL
ALT: 18 U/L (ref 0–53)
AST: 22 U/L (ref 0–37)
Albumin: 4.2 g/dL (ref 3.5–5.2)
Alkaline Phosphatase: 66 U/L (ref 39–117)
BUN: 43 mg/dL — AB (ref 6–23)
CALCIUM: 9.4 mg/dL (ref 8.4–10.5)
CHLORIDE: 105 meq/L (ref 96–112)
CO2: 29 meq/L (ref 19–32)
CREATININE: 2.33 mg/dL — AB (ref 0.40–1.50)
GFR: 28.37 mL/min — ABNORMAL LOW (ref >60.00)
Glucose, Bld: 100 mg/dL — ABNORMAL HIGH (ref 70–99)
Potassium: 4.6 mEq/L (ref 3.5–5.1)
SODIUM: 139 meq/L (ref 135–145)
Total Bilirubin: 0.3 mg/dL (ref 0.2–1.2)
Total Protein: 7.4 g/dL (ref 6.0–8.3)

## 2016-01-25 LAB — CBC
HEMATOCRIT: 36.8 % — AB (ref 39.0–52.0)
HEMOGLOBIN: 12.2 g/dL — AB (ref 13.0–17.0)
MCHC: 33.1 g/dL (ref 30.0–36.0)
MCV: 94.5 fl (ref 78.0–100.0)
PLATELETS: 289 10*3/uL (ref 150.0–400.0)
RBC: 3.9 Mil/uL — ABNORMAL LOW (ref 4.22–5.81)
RDW: 14.3 % (ref 11.5–15.5)
WBC: 9.7 10*3/uL (ref 4.0–10.5)

## 2016-01-25 LAB — LIPID PANEL
Cholesterol: 130 mg/dL (ref 0–200)
HDL: 36.7 mg/dL — ABNORMAL LOW (ref >39.00)
LDL CALC: 66 mg/dL (ref 0–99)
NonHDL: 93.23
TRIGLYCERIDES: 137 mg/dL (ref 0.0–149.0)
Total CHOL/HDL Ratio: 4
VLDL: 27.4 mg/dL (ref 0.0–40.0)

## 2016-01-25 LAB — HEMOGLOBIN A1C: Hgb A1c MFr Bld: 6.1 % (ref 4.6–6.5)

## 2016-01-25 LAB — MICROALBUMIN / CREATININE URINE RATIO
CREATININE, U: 85.9 mg/dL
Microalb Creat Ratio: 34.9 mg/g — ABNORMAL HIGH (ref 0.0–30.0)
Microalb, Ur: 30 mg/dL — ABNORMAL HIGH (ref 0.0–1.9)

## 2016-01-27 ENCOUNTER — Ambulatory Visit: Payer: Medicare PPO | Admitting: Endocrinology

## 2016-01-30 ENCOUNTER — Telehealth: Payer: Self-pay | Admitting: Endocrinology

## 2016-01-30 NOTE — Telephone Encounter (Signed)
Nothing out of line , will discuss when he comes for office visit

## 2016-01-30 NOTE — Telephone Encounter (Signed)
Pt called about blood tests from 01/25/2016. Could you review and please advise the pt? Thanks!

## 2016-01-30 NOTE — Telephone Encounter (Signed)
I contacted the pt and advised of note below. Pt voiced understanding.  

## 2016-01-30 NOTE — Telephone Encounter (Signed)
PT called requesting his lab results.  Requests call back at your earliest convenience.

## 2016-02-15 ENCOUNTER — Other Ambulatory Visit: Payer: Medicare PPO

## 2016-02-15 DIAGNOSIS — Z23 Encounter for immunization: Secondary | ICD-10-CM | POA: Diagnosis not present

## 2016-02-15 DIAGNOSIS — E7800 Pure hypercholesterolemia, unspecified: Secondary | ICD-10-CM | POA: Diagnosis not present

## 2016-02-15 DIAGNOSIS — Z794 Long term (current) use of insulin: Secondary | ICD-10-CM | POA: Diagnosis not present

## 2016-02-15 DIAGNOSIS — J449 Chronic obstructive pulmonary disease, unspecified: Secondary | ICD-10-CM | POA: Diagnosis not present

## 2016-02-15 DIAGNOSIS — I10 Essential (primary) hypertension: Secondary | ICD-10-CM | POA: Diagnosis not present

## 2016-02-15 DIAGNOSIS — E1121 Type 2 diabetes mellitus with diabetic nephropathy: Secondary | ICD-10-CM | POA: Diagnosis not present

## 2016-02-15 DIAGNOSIS — N183 Chronic kidney disease, stage 3 (moderate): Secondary | ICD-10-CM | POA: Diagnosis not present

## 2016-02-15 DIAGNOSIS — Z0001 Encounter for general adult medical examination with abnormal findings: Secondary | ICD-10-CM | POA: Diagnosis not present

## 2016-02-15 DIAGNOSIS — D631 Anemia in chronic kidney disease: Secondary | ICD-10-CM | POA: Diagnosis not present

## 2016-02-22 ENCOUNTER — Ambulatory Visit: Payer: Medicare PPO | Admitting: Endocrinology

## 2016-03-08 ENCOUNTER — Other Ambulatory Visit: Payer: Self-pay | Admitting: *Deleted

## 2016-03-08 MED ORDER — FUROSEMIDE 20 MG PO TABS: 20.0000 mg | ORAL_TABLET | Freq: Every day | ORAL | 0 refills | Status: DC

## 2016-03-09 ENCOUNTER — Other Ambulatory Visit: Payer: Self-pay | Admitting: *Deleted

## 2016-03-09 MED ORDER — FUROSEMIDE 20 MG PO TABS: ORAL_TABLET | ORAL | 0 refills | Status: DC

## 2016-03-21 ENCOUNTER — Encounter: Payer: Self-pay | Admitting: Endocrinology

## 2016-03-21 ENCOUNTER — Ambulatory Visit (INDEPENDENT_AMBULATORY_CARE_PROVIDER_SITE_OTHER): Payer: Medicare PPO | Admitting: Endocrinology

## 2016-03-21 VITALS — BP 140/70 | HR 100 | Ht 70.0 in | Wt 238.0 lb

## 2016-03-21 DIAGNOSIS — Z794 Long term (current) use of insulin: Secondary | ICD-10-CM

## 2016-03-21 DIAGNOSIS — E1129 Type 2 diabetes mellitus with other diabetic kidney complication: Secondary | ICD-10-CM | POA: Diagnosis not present

## 2016-03-21 DIAGNOSIS — D638 Anemia in other chronic diseases classified elsewhere: Secondary | ICD-10-CM

## 2016-03-21 DIAGNOSIS — E1165 Type 2 diabetes mellitus with hyperglycemia: Secondary | ICD-10-CM | POA: Diagnosis not present

## 2016-03-21 DIAGNOSIS — N184 Chronic kidney disease, stage 4 (severe): Secondary | ICD-10-CM

## 2016-03-21 DIAGNOSIS — R809 Proteinuria, unspecified: Secondary | ICD-10-CM

## 2016-03-21 NOTE — Progress Notes (Signed)
Patient ID: ALDYN TOON, male   DOB: 08/31/1927, 82 y.o.   MRN: [id number]   Reason for Appointment: Followup of diabetes and various issues  History of Present Illness   Type 2 DIABETES MELITUS   He has had long-standing diabetes and has been on basal bolus insulin for the last few years Since 2014 he has required larger doses of insulin especially with having to take periodic courses of steroids for his pulmonary problems Also his Actos was stopped because of tendency to edema and metformin stopped because of renal dysfunction  Insulin regimen: Novolog 15-17-17 units before meals, Lantus  20-25  a day.  A1c is lower as of 8/17 at 6.1, previously about 7  Generally compliant with checking his blood sugar at least 3 times a day on average  Current blood sugar patterns and problems identified: His average blood sugar for the last 4 weeks is only 116 with some readings at all times  His fasting blood sugars are fairly good overall with some tendency to lower readings 2-3 weeks ago  He is not able to start exercising and is starting to go to the gym and do some biking  Since he started getting lower readings with exercise about 2-3 weeks ago is trying to reduce his morning NovoLog when he goes for exercise  However was also getting readings as low as 51 fasting.  He has reduced his overall NovoLog dose only slightly at mealtimes  He does have some variability in his diet and he thinks this affects his readings overnight  Only rarely will have sweets liquor do not, highest reading 215 recently  Hypoglycemia: Only once recently as above   Oral hypoglycemic drugs: None           Monitors blood glucose:  3-4 times a day   Glucometer: Accu-Chek        Glucose  monitor download shows the following results more recently Average reading are for the last 4 weeks  Mean values apply above for all meters except median for One Touch  PRE-MEAL Fasting Lunch Dinner Bedtime  Overall  Glucose range:  51- 144   53- 215  56-228  84- 236   Mean/median:  101  131  125  127 116   Meals: 3 meals per day.  breakfast: oatmeal / grits, PB Dinner usually at 6:30 pm       Physical activity: exercise: bike daily  Weight history:  Wt Readings from Last 3 Encounters:  03/21/16 238 lb (108 kg)  10/21/15 242 lb (109.8 kg)  09/25/15 235 lb (106.6 kg)               Complications are: Nephropathy. Microalbumin level now consistently normal  LABS:  Lab Results  Component Value Date   HGBA1C 6.1 01/25/2016   HGBA1C 7.0 (H) 10/19/2015   HGBA1C 7.1 (H) 07/19/2015   Lab Results  Component Value Date   MICROALBUR 30.0 (H) 01/25/2016   LDLCALC 66 01/25/2016   CREATININE 2.33 (H) 01/25/2016     Problem 2:  RENAL insufficiency: He has had chronic increase in creatinine, was 1.7 in 2014. This has been related to nephropathy and glomerulosclerosis ACE inhibitor/ARB recommended By nephrologist but not done because of the history of patient getting hyperkalemia with these drugs in the past Renal ultrasound did not show any abnormality  Creatinine is fluctuating, relatively lowerIn 8/17 highest 2.6 Lasix was reduced to every other day previously  He has a normal PTH  when last checked, not on calcitriol and only on vitamin D   Lab Results  Component Value Date   CREATININE 2.33 (H) 01/25/2016   CREATININE 2.62 (H) 10/19/2015   CREATININE 2.41 (H) 07/19/2015   CREATININE 2.55 (H) 04/19/2015    Proteinuria: He was given diltiazem previously For his proteinuria  when blood pressure was relatively high  but he claims that it was causing itching on his abdominal area and he stopped it      Medication List       Accurate as of 03/21/16  9:58 PM. Always use your most recent med list.          ACCU-CHEK AVIVA PLUS w/Device Kit CHECK BLOOD SUGAR FOUR TIMES DAILY   ACCU-CHEK AVIVA Soln Use as directed   ACCU-CHEK SOFTCLIX LANCETS lancets TEST AS INSTRUCTED  TO OBTAIN A BLOOD SPECIMEN 4 TIMES DAILY   Alcohol Wipes 70 % Pads Use 7 pads per day   Alcohol Prep Pads   BD INSULIN SYRINGE ULTRAFINE 31G X 5/16" 0.3 ML Misc Generic drug:  Insulin Syringe-Needle U-100 USE THREE TIMES DAILY   budesonide-formoterol 160-4.5 MCG/ACT inhaler Commonly known as:  SYMBICORT Inhale 2 puffs into the lungs 2 (two) times daily.   chlorpheniramine 4 MG tablet Commonly known as:  CHLOR-TRIMETON 2 tablets by mouth at bedtime. 1 extra in am daily as needed.   dextromethorphan-guaiFENesin 30-600 MG 12hr tablet Commonly known as:  MUCINEX DM Reported on 10/21/2015   Ferrous Sulfate 27 MG Tabs Take 1 tablet by mouth daily.   FIBER PO Reported on 10/21/2015   finasteride 5 MG tablet Commonly known as:  PROSCAR TAKE 1 TABLET EVERY DAY   FLUTTER Devi Use as directed.   furosemide 20 MG tablet Commonly known as:  LASIX Take 1 tablet daily   glucose blood test strip Commonly known as:  ACCU-CHEK AVIVA PLUS USE AS INSTRUCTED TO CHECK BLOOD SUGAR FOUR TIMES DAILY. DX: E11.21   insulin aspart 100 UNIT/ML injection Commonly known as:  novoLOG Inject 15 units with breakfast and 17 units with lunch and supper.   insulin glargine 100 UNIT/ML injection Commonly known as:  LANTUS INJECT 24 UNITS SUBCUTANEOUSLY IN THE MORNING AND 20 UNITS IN THE EVENING   insulin lispro 100 UNIT/ML injection Commonly known as:  HUMALOG Inject 0.21 mLs (21 Units total) into the skin 3 (three) times daily with meals. Sliding scale with each meal   montelukast 10 MG tablet Commonly known as:  SINGULAIR TAKE 1 TABLET BY MOUTH EVERY NIGHT AT BEDTIME   multivitamin with minerals Tabs tablet Take 1 tablet by mouth daily.   omeprazole 20 MG capsule Commonly known as:  PRILOSEC   pantoprazole 40 MG tablet Commonly known as:  PROTONIX TAKE 1 TABLET BY MOUTH EVERY DAY 30 TO 60 MINUTES PRIOR TO FIRST MEAL OF THE DAY   PROAIR HFA 108 (90 Base) MCG/ACT inhaler Generic drug:   albuterol   simvastatin 20 MG tablet Commonly known as:  ZOCOR TAKE 1 TABLET EVERY DAY   TOVIAZ 8 MG Tb24 tablet Generic drug:  fesoterodine   Vitamin D3 2000 units Tabs Take 1 capsule by mouth every morning.   zolpidem 6.25 MG CR tablet Commonly known as:  AMBIEN CR Reported on 10/21/2015       Allergies:  Allergies  Allergen Reactions  . Nitrous Oxide Other (See Comments)  . Penicillins   . Diltiazem Rash    Past Medical History:  Diagnosis Date  . Anemia   .  Chronic renal insufficiency   . DDD (degenerative disc disease), lumbar   . Diabetes mellitus   . Hypertension   . Neuromuscular disorder ([place])   . Osteoporosis     Past Surgical History:  Procedure Laterality Date  . EYE SURGERY    . VASECTOMY    . VIDEO BRONCHOSCOPY Bilateral 08/25/2012   Procedure: VIDEO BRONCHOSCOPY WITHOUT FLUORO;  Surgeon: Brand Males, MD;  Location: [facility];  Service: Cardiopulmonary;  Laterality: Bilateral;    Family History  Problem Relation Age of Onset  . Diabetes Mellitus II    . Hypertension      Social History:  reports that he quit smoking about 42 years ago. His smoking use included Cigarettes. He has a 99.00 pack-year smoking history. He quit smokeless tobacco use about 40 years ago. He reports that he does not drink alcohol or use drugs.  Review of Systems -   HYPERTENSION: This has been present in the past.  Currently not on any medication   Previous history of anemia related to renal dysfunction, recent hemoglobin is stable   Lab Results  Component Value Date   WBC 9.7 01/25/2016   HGB 12.2 (L) 01/25/2016   HCT 36.8 (L) 01/25/2016   MCV 94.5 01/25/2016   PLT 289.0 01/25/2016     He has been Using CPAP and this is being adjusted by pulmonologist.    Edema: Mostly on the left leg  Taking 20 mg Lasix, qod as before  He did not like wearing the elastic stockings prescribed previously  He has had chronic lower urinary tract problems and BPH.   Prescriptions are being done by his urologist  HYPERLIPIDEMIA:         The lipid abnormality consists of  minimally elevated LDL, taking simvastatin for cardiovascular protection.  Has good levels although LDL low normal   Lab Results  Component Value Date   CHOL 130 01/25/2016   HDL 36.70 (L) 01/25/2016   LDLCALC 66 01/25/2016   TRIG 137.0 01/25/2016   CHOLHDL 4 01/25/2016     Diabetic foot exam results in 10/17: normal monofilament sensation in the toes and plantar surfaces, no skin lesions or ulcers on the feet and normal pedal pulses   Examination:   BP 140/70   Pulse 100   Ht _0  (1.778 m)   Wt 238 lb (108 kg)   BMI 34.15 kg/m   Body mass index is 34.15 kg/m.   No ankle edema   Diabetic Foot Exam - Simple   Simple Foot Form Diabetic Foot exam was performed with the following findings:  Yes 03/21/2016  4:05 PM  Visual Inspection No deformities, no ulcerations, no other skin breakdown bilaterally:  Yes Sensation Testing Intact to touch and monofilament testing bilaterally:  Yes Pulse Check Posterior Tibialis and Dorsalis pulse intact bilaterally:  Yes Comments      Assesment/PLAN:   1. Diabetes type 2 On insulin  See history of present illness for detailed discussion of current diabetes management, blood sugar patterns and problems identifie  The patient's diabetes control is overall  well controlled For his age with basal bolus insulin regimen. A1c was lower than usual at 6.1 in August  He probably has overall lower readings especially since he has started some exercise and he is trying to do some biking almost everyday Although he has reduced his NovoLog on the days he is going for exercise he still tends to have low normal readings at times in the morning and  occasionally during the day also Considering his duration of diabetes he does not need as tight a control  Also discussed that since he is still on relatively large amounts of insulin including  basal insulin he will not be able to switch to GLP-1 drugs or other medications especially with his renal dysfunction Advised him to back down on his Lantus to 20 units in the evening for now May need to cut back on his mealtime coverage also He can take only 5-6 units for breakfast coverage when he is going for this exercise at the gym  2.   Chronic kidney disease: His creatinine is overall stable but slightly lower in August Will check his PTH periodically,  Microalbumin is minimally increased  He does not want to follow-up with nephrologist since he is seeing multiple physicians  3.  Hypertension history: Blood pressure is normal without any medication   4.  History of mild anemia, stable This is related to chronic kidney disease  5.  Left leg edema is controlled with low-dose Lasix  6.  Respiratory symptoms:Still has mild chronic cough but no dyspnea  Patient Instructions  Lantus 20 in pm also  Check blood sugars on waking up daily   Also check blood sugars about 2 hours after a meal and do this after different meals by rotation  Recommended blood sugar levels on waking up is 90-130 and about 2 hours after meal is 130-160  Please bring your blood sugar monitor to each visit, thank you  Novolog 5-6 for Exercise in ams    Counseling time on subjects discussed above is over 50% of today's 25 minute visit   Canisha Issac  03/21/2016, 9:58 PM

## 2016-03-21 NOTE — Patient Instructions (Addendum)
Lantus 20 in pm also  Check blood sugars on waking up daily   Also check blood sugars about 2 hours after a meal and do this after different meals by rotation  Recommended blood sugar levels on waking up is 90-130 and about 2 hours after meal is 130-160  Please bring your blood sugar monitor to each visit, thank you  Novolog 5-6 for Exercise in ams

## 2016-03-28 DIAGNOSIS — H903 Sensorineural hearing loss, bilateral: Secondary | ICD-10-CM | POA: Diagnosis not present

## 2016-04-13 ENCOUNTER — Other Ambulatory Visit: Payer: Self-pay | Admitting: Endocrinology

## 2016-04-19 DIAGNOSIS — G4733 Obstructive sleep apnea (adult) (pediatric): Secondary | ICD-10-CM | POA: Diagnosis not present

## 2016-05-02 DIAGNOSIS — Z720 Tobacco use: Secondary | ICD-10-CM | POA: Diagnosis not present

## 2016-05-02 DIAGNOSIS — J449 Chronic obstructive pulmonary disease, unspecified: Secondary | ICD-10-CM | POA: Diagnosis not present

## 2016-05-02 DIAGNOSIS — R911 Solitary pulmonary nodule: Secondary | ICD-10-CM | POA: Diagnosis not present

## 2016-05-02 DIAGNOSIS — R0902 Hypoxemia: Secondary | ICD-10-CM | POA: Diagnosis not present

## 2016-05-02 DIAGNOSIS — G4733 Obstructive sleep apnea (adult) (pediatric): Secondary | ICD-10-CM | POA: Diagnosis not present

## 2016-05-08 DIAGNOSIS — R350 Frequency of micturition: Secondary | ICD-10-CM | POA: Diagnosis not present

## 2016-05-08 DIAGNOSIS — N3941 Urge incontinence: Secondary | ICD-10-CM | POA: Diagnosis not present

## 2016-06-07 ENCOUNTER — Other Ambulatory Visit: Payer: Self-pay | Admitting: Endocrinology

## 2016-06-08 DIAGNOSIS — L98499 Non-pressure chronic ulcer of skin of other sites with unspecified severity: Secondary | ICD-10-CM | POA: Diagnosis not present

## 2016-06-08 DIAGNOSIS — E1121 Type 2 diabetes mellitus with diabetic nephropathy: Secondary | ICD-10-CM | POA: Diagnosis not present

## 2016-06-21 ENCOUNTER — Other Ambulatory Visit (INDEPENDENT_AMBULATORY_CARE_PROVIDER_SITE_OTHER): Payer: Medicare PPO

## 2016-06-21 DIAGNOSIS — E1165 Type 2 diabetes mellitus with hyperglycemia: Secondary | ICD-10-CM

## 2016-06-21 DIAGNOSIS — N184 Chronic kidney disease, stage 4 (severe): Secondary | ICD-10-CM | POA: Diagnosis not present

## 2016-06-21 DIAGNOSIS — Z794 Long term (current) use of insulin: Secondary | ICD-10-CM

## 2016-06-21 LAB — COMPREHENSIVE METABOLIC PANEL
ALT: 16 U/L (ref 0–53)
AST: 22 U/L (ref 0–37)
Albumin: 3.9 g/dL (ref 3.5–5.2)
Alkaline Phosphatase: 94 U/L (ref 39–117)
BUN: 61 mg/dL — ABNORMAL HIGH (ref 6–23)
CALCIUM: 9.6 mg/dL (ref 8.4–10.5)
CHLORIDE: 104 meq/L (ref 96–112)
CO2: 32 meq/L (ref 19–32)
Creatinine, Ser: 2.46 mg/dL — ABNORMAL HIGH (ref 0.40–1.50)
GFR: 26.62 mL/min — AB (ref >60.00)
Glucose, Bld: 77 mg/dL (ref 70–99)
Potassium: 5.7 mEq/L — ABNORMAL HIGH (ref 3.5–5.1)
Sodium: 140 mEq/L (ref 135–145)
Total Bilirubin: 0.3 mg/dL (ref 0.2–1.2)
Total Protein: 7.5 g/dL (ref 6.0–8.3)

## 2016-06-21 LAB — HEMOGLOBIN A1C: Hgb A1c MFr Bld: 7.2 % — ABNORMAL HIGH (ref 4.6–6.5)

## 2016-06-22 ENCOUNTER — Other Ambulatory Visit: Payer: Medicare PPO

## 2016-06-22 LAB — MICROALBUMIN / CREATININE URINE RATIO
CREATININE, U: 56.8 mg/dL
Microalb Creat Ratio: 45.1 mg/g — ABNORMAL HIGH (ref 0.0–30.0)
Microalb, Ur: 25.6 mg/dL — ABNORMAL HIGH (ref 0.0–1.9)

## 2016-06-22 LAB — PARATHYROID HORMONE, INTACT (NO CA): PTH: 38 pg/mL (ref 15–65)

## 2016-06-27 ENCOUNTER — Encounter: Payer: Self-pay | Admitting: Endocrinology

## 2016-06-27 ENCOUNTER — Ambulatory Visit (INDEPENDENT_AMBULATORY_CARE_PROVIDER_SITE_OTHER): Payer: Medicare PPO | Admitting: Endocrinology

## 2016-06-27 VITALS — BP 126/70 | HR 76 | Ht 70.0 in | Wt 233.0 lb

## 2016-06-27 DIAGNOSIS — Z794 Long term (current) use of insulin: Secondary | ICD-10-CM

## 2016-06-27 DIAGNOSIS — R809 Proteinuria, unspecified: Secondary | ICD-10-CM

## 2016-06-27 DIAGNOSIS — E1129 Type 2 diabetes mellitus with other diabetic kidney complication: Secondary | ICD-10-CM

## 2016-06-27 DIAGNOSIS — E1165 Type 2 diabetes mellitus with hyperglycemia: Secondary | ICD-10-CM

## 2016-06-27 DIAGNOSIS — N184 Chronic kidney disease, stage 4 (severe): Secondary | ICD-10-CM

## 2016-06-27 NOTE — Progress Notes (Signed)
Patient ID: Clayton [name], male   DOB: 01/12/1928, 83 y.o.   MRN: [id number]   Reason for Appointment: Followup of diabetes and various issues  History of Present Illness   Type 2 DIABETES MELITUS diagnosed 1989  He has had long-standing diabetes and has been on basal bolus insulin for the last few years Since 2014 he has required larger doses of insulin especially with having to take periodic courses of steroids for his pulmonary problems Also his Actos was stopped because of tendency to edema and metformin stopped because of renal dysfunction  Insulin regimen: Novolog 16-18-18 units before meals, Lantus  20-20  a day.  A1c is higher than before at 7.2, has been as low as 6.1  Generally compliant with checking his blood sugar at least 3 times a day on average  Current blood sugar patterns and problems identified: His average blood sugar for the last 4 weeks is increased at 153, previously only 116   His fasting blood sugars are generally close to target with no hypoglycemia and slightly lower if he checks early in the morning, previously were sometimes low normal and Lantus was reduced by 5 units in the evening  Blood sugars are usually fluctuating significantly in the afternoons and more so last month  Also blood sugars overall are higher at suppertime but not consistently  He did have relatively high readings last month and overall blood sugars are better in the last 10 days or so, not clear if he was inconsistent with diet  He now tries to exercise up to 2 hours at the gym on his bike and because of tendency to low sugars he will not take the Novolog before he goes for exercise  Does not check blood sugars after supper   fairly good overall with some tendency to lower readings 2-3 weeks ago  He is not able to start exercising and is starting to go to the gym and do some biking  Since he started getting lower readings with exercise about 2-3 weeks ago is trying  to reduce his morning NovoLog when he goes for exercise  He does appear to be losing weight gradually  Hypoglycemia: None recently as above   Oral hypoglycemic drugs: None           Monitors blood glucose:  3-4 times a day   Glucometer: Accu-Chek        Glucose  monitor download shows the following results more recently Average reading are for the last 4 weeks  Mean values apply above for all meters except median for One Touch  PRE-MEAL Fasting Lunch Dinner Bedtime Overall  Glucose range: 98-175  87-272  122-221  168    Mean/median: 140 150  159   153     Meals: 3 meals per day.  breakfast: oatmeal / grits, PB Dinner usually at 6:30 pm       Physical activity: exercise: bike daily  Weight history:  Wt Readings from Last 3 Encounters:  06/27/16 233 lb (105.7 kg)  03/21/16 238 lb (108 kg)  10/21/15 242 lb (109.8 kg)               Complications are: Nephropathy. Microalbumin level now consistently normal  LABS:  Lab Results  Component Value Date   HGBA1C 7.2 (H) 06/21/2016   HGBA1C 6.1 01/25/2016   HGBA1C 7.0 (H) 10/19/2015   Lab Results  Component Value Date   MICROALBUR 25.6 (H)  06/21/2016   LDLCALC 66 01/25/2016   CREATININE 2.46 (H) 06/21/2016     Problem 2:  RENAL insufficiency: He has had chronic increase in creatinine, was 1.7 in 2014. This has been related to nephropathy and glomerulosclerosis ACE inhibitor/ARB recommended by nephrologist but not done because of the history of patient getting hyperkalemia with these drugs in the past Renal ultrasound did not show any abnormality  Creatinine is overall stable,  highest 2.6  He has a normal PTH, not on calcitriol and only on vitamin D He does not want to follow-up with nephrologist   Lab Results  Component Value Date   CREATININE 2.46 (H) 06/21/2016   CREATININE 2.33 (H) 01/25/2016   CREATININE 2.62 (H) 10/19/2015   CREATININE 2.41 (H) 07/19/2015    Proteinuria: He was given diltiazem  previously For his proteinuria  when blood pressure was relatively high  but he claims that it was causing itching on his abdominal area Currently has mild increase in microalbumin    Allergies as of 06/27/2016      Reactions   Nitrous Oxide Other (See Comments)   Penicillins    Diltiazem Rash      Medication List       Accurate as of 06/27/16 11:59 PM. Always use your most recent med list.          ACCU-CHEK AVIVA PLUS w/Device Kit CHECK BLOOD SUGAR FOUR TIMES DAILY   ACCU-CHEK AVIVA Soln Use as directed   ACCU-CHEK SOFTCLIX LANCETS lancets TEST AS INSTRUCTED TO OBTAIN A BLOOD SPECIMEN 4 TIMES DAILY   Alcohol Wipes 70 % Pads Use 7 pads per day   Alcohol Prep Pads   BD INSULIN SYRINGE ULTRAFINE 31G X 5/16" 0.3 ML Misc Generic drug:  Insulin Syringe-Needle U-100 USE THREE TIMES DAILY   budesonide-formoterol 160-4.5 MCG/ACT inhaler Commonly known as:  SYMBICORT Inhale 2 puffs into the lungs 2 (two) times daily.   chlorpheniramine 4 MG tablet Commonly known as:  CHLOR-TRIMETON 2 tablets by mouth at bedtime. 1 extra in am daily as needed.   dextromethorphan-guaiFENesin 30-600 MG 12hr tablet Commonly known as:  MUCINEX DM Reported on 10/21/2015   Ferrous Sulfate 27 MG Tabs Take 1 tablet by mouth daily.   FIBER PO Reported on 10/21/2015   finasteride 5 MG tablet Commonly known as:  PROSCAR TAKE 1 TABLET EVERY DAY   FLUTTER Devi Use as directed.   furosemide 20 MG tablet Commonly known as:  LASIX Take 1 tablet daily   glucose blood test strip Commonly known as:  ACCU-CHEK AVIVA PLUS USE AS INSTRUCTED TO CHECK BLOOD SUGAR FOUR TIMES DAILY. DX: E11.21   insulin aspart 100 UNIT/ML injection Commonly known as:  novoLOG Inject 15 units with breakfast and 17 units with lunch and supper.   insulin glargine 100 UNIT/ML injection Commonly known as:  LANTUS INJECT 24 UNITS SUBCUTANEOUSLY IN THE MORNING AND 20 UNITS IN THE EVENING   insulin lispro 100 UNIT/ML  injection Commonly known as:  HUMALOG Inject 0.21 mLs (21 Units total) into the skin 3 (three) times daily with meals. Sliding scale with each meal   montelukast 10 MG tablet Commonly known as:  SINGULAIR TAKE 1 TABLET BY MOUTH EVERY NIGHT AT BEDTIME   multivitamin with minerals Tabs tablet Take 1 tablet by mouth daily.   omeprazole 20 MG capsule Commonly known as:  PRILOSEC   pantoprazole 40 MG tablet Commonly known as:  PROTONIX TAKE 1 TABLET BY MOUTH EVERY DAY 30 TO 60 MINUTES  PRIOR TO FIRST MEAL OF THE DAY   PROAIR HFA 108 (90 Base) MCG/ACT inhaler Generic drug:  albuterol   simvastatin 20 MG tablet Commonly known as:  ZOCOR TAKE 1 TABLET EVERY DAY   TOVIAZ 8 MG Tb24 tablet Generic drug:  fesoterodine   Vitamin D3 2000 units Tabs Take 1 capsule by mouth every morning.   zolpidem 6.25 MG CR tablet Commonly known as:  AMBIEN CR Reported on 10/21/2015       Allergies:  Allergies  Allergen Reactions  . Nitrous Oxide Other (See Comments)  . Penicillins   . Diltiazem Rash    Past Medical History:  Diagnosis Date  . Anemia   . Chronic renal insufficiency   . DDD (degenerative disc disease), lumbar   . Diabetes mellitus   . Hypertension   . Neuromuscular disorder ([place])   . Osteoporosis     Past Surgical History:  Procedure Laterality Date  . EYE SURGERY    . VASECTOMY    . VIDEO BRONCHOSCOPY Bilateral 08/25/2012   Procedure: VIDEO BRONCHOSCOPY WITHOUT FLUORO;  Surgeon: Brand Males, MD;  Location: [facility];  Service: Cardiopulmonary;  Laterality: Bilateral;    Family History  Problem Relation Age of Onset  . Diabetes Mellitus II    . Hypertension      Social History:  reports that he quit smoking about 43 years ago. His smoking use included Cigarettes. He has a 99.00 pack-year smoking history. He quit smokeless tobacco use about 40 years ago. He reports that he does not drink alcohol or use drugs.  Review of Systems -   HYPERTENSION:    Currently not on any medication and blood pressure is still normal   Previous history of anemia related to renal dysfunction, recent hemoglobin is stable   Lab Results  Component Value Date   WBC 9.7 01/25/2016   HGB 12.2 (L) 01/25/2016   HCT 36.8 (L) 01/25/2016   MCV 94.5 01/25/2016   PLT 289.0 01/25/2016    He has been Using CPAP and this is being adjusted by pulmonologist.    Edema: Mostly on the left leg  Taking 20 mg Lasix, 4 days a week with good control  He did not like wearing the elastic stockings prescribed previously  He has had chronic lower urinary tract problems and BPH.  Prescriptions are being done by his urologist  HYPERLIPIDEMIA:         The lipid abnormality consists of  minimally elevated LDL, taking simvastatin for cardiovascular protection.  Has good levels of LDL but HDL relatively low   Lab Results  Component Value Date   CHOL 130 01/25/2016   HDL 36.70 (L) 01/25/2016   LDLCALC 66 01/25/2016   TRIG 137.0 01/25/2016   CHOLHDL 4 01/25/2016     Diabetic foot exam results in 10/17: normal monofilament sensation in the toes and plantar surfaces, no skin lesions or ulcers on the feet and normal pedal pulses   Examination:   BP 126/70   Pulse 76   Ht _0  (1.778 m)   Wt 233 lb (105.7 kg)   SpO2 98%   BMI 33.43 kg/m   Body mass index is 33.43 kg/m.   No ankle edema    Assesment/PLAN:   1. Diabetes type 2 with obesity and On insulin only  See history of present illness for detailed discussion of current diabetes management, blood sugar patterns and problems identifie  The patient's diabetes control is overall  well controlled for his age  with basal bolus insulin regimen including Lantus twice a day A1c reasonably good at 7.2 . He does make a few adjustments in his mealtime insulin periodically based on his sugars but does not check readings after supper to help adjust suppertime dose Did have some readings over 200 last month probably  related to inconsistent diet Now blood sugars are fairly stable No hypoglycemia and fasting readings are fairly stable also  He can continue 20 units of Lantus twice a day and NovoLog to be adjusted based on meal size and pre-meal blood sugar He can again skip the dose of Novolog when going for exercise He has been trying to lose a little weight with improving portion control  2.   Chronic kidney disease: His creatinine is overall stable No secondary hyperparathyroidism Microalbumin is minimally increased   HYPERKALEMIA: This is new.  He is eating significant amount of hypertension food such as bananas and oranges Printed out information on hyperkalemia and low potassium diet and discussed  3.  Hypertension history: Blood pressure is normal without any medication, continue to monitor, not a candidate for treatment despite mild increase in microalbumin    4.  History of mild anemia, stable This is related to chronic kidney disease  5.  Left leg edema is controlled with low-dose Lasix    Patient Instructions  Low potassium diet   Counseling time on subjects discussed above is over 50% of today's 25 minute visit   Rambo Sarafian  06/28/2016, 7:59 AM

## 2016-06-27 NOTE — Patient Instructions (Signed)
Low potassium diet

## 2016-07-03 ENCOUNTER — Other Ambulatory Visit: Payer: Self-pay | Admitting: Endocrinology

## 2016-07-11 ENCOUNTER — Other Ambulatory Visit (INDEPENDENT_AMBULATORY_CARE_PROVIDER_SITE_OTHER): Payer: Medicare PPO

## 2016-07-11 DIAGNOSIS — E1165 Type 2 diabetes mellitus with hyperglycemia: Secondary | ICD-10-CM

## 2016-07-11 DIAGNOSIS — N184 Chronic kidney disease, stage 4 (severe): Secondary | ICD-10-CM | POA: Diagnosis not present

## 2016-07-11 DIAGNOSIS — Z794 Long term (current) use of insulin: Secondary | ICD-10-CM | POA: Diagnosis not present

## 2016-07-11 LAB — BASIC METABOLIC PANEL
BUN: 49 mg/dL — AB (ref 6–23)
CALCIUM: 9.4 mg/dL (ref 8.4–10.5)
CO2: 25 mEq/L (ref 19–32)
CREATININE: 2.39 mg/dL — AB (ref 0.40–1.50)
Chloride: 104 mEq/L (ref 96–112)
GFR: 27.52 mL/min — AB (ref >60.00)
GLUCOSE: 130 mg/dL — AB (ref 70–99)
Potassium: 4.4 mEq/L (ref 3.5–5.1)
Sodium: 137 mEq/L (ref 135–145)

## 2016-07-11 LAB — CBC
HCT: 33.9 % — ABNORMAL LOW (ref 39.0–52.0)
HEMOGLOBIN: 11.5 g/dL — AB (ref 13.0–17.0)
MCHC: 33.8 g/dL (ref 30.0–36.0)
MCV: 94.7 fl (ref 78.0–100.0)
Platelets: 262 10*3/uL (ref 150.0–400.0)
RBC: 3.58 Mil/uL — ABNORMAL LOW (ref 4.22–5.81)
RDW: 13.4 % (ref 11.5–15.5)
WBC: 9.9 10*3/uL (ref 4.0–10.5)

## 2016-07-11 LAB — HEMOGLOBIN A1C: Hgb A1c MFr Bld: 7 % — ABNORMAL HIGH (ref 4.6–6.5)

## 2016-07-13 ENCOUNTER — Telehealth: Payer: Self-pay | Admitting: Endocrinology

## 2016-07-13 NOTE — Telephone Encounter (Signed)
Calling for lab results. °

## 2016-07-16 NOTE — Telephone Encounter (Signed)
Has not been resulted by the doctor yet

## 2016-07-16 NOTE — Telephone Encounter (Signed)
Patient is calling for lab results.

## 2016-07-17 DIAGNOSIS — R05 Cough: Secondary | ICD-10-CM | POA: Diagnosis not present

## 2016-07-17 NOTE — Telephone Encounter (Signed)
Have not been resulted yet

## 2016-07-17 NOTE — Telephone Encounter (Signed)
Please review the lab results for this pt he is waiting

## 2016-07-18 NOTE — Telephone Encounter (Signed)
A1c is 7, Kidney test stable, anemia mild, no item of concern

## 2016-07-19 DIAGNOSIS — H43813 Vitreous degeneration, bilateral: Secondary | ICD-10-CM | POA: Diagnosis not present

## 2016-07-19 DIAGNOSIS — Z961 Presence of intraocular lens: Secondary | ICD-10-CM | POA: Diagnosis not present

## 2016-07-19 DIAGNOSIS — H35373 Puckering of macula, bilateral: Secondary | ICD-10-CM | POA: Diagnosis not present

## 2016-07-19 DIAGNOSIS — E113293 Type 2 diabetes mellitus with mild nonproliferative diabetic retinopathy without macular edema, bilateral: Secondary | ICD-10-CM | POA: Diagnosis not present

## 2016-07-19 LAB — HM DIABETES EYE EXAM

## 2016-07-19 NOTE — Telephone Encounter (Signed)
Pt aware.

## 2016-07-24 ENCOUNTER — Encounter: Payer: Self-pay | Admitting: Endocrinology

## 2016-07-27 ENCOUNTER — Other Ambulatory Visit: Payer: Self-pay | Admitting: Endocrinology

## 2016-07-30 ENCOUNTER — Telehealth: Payer: Self-pay | Admitting: Endocrinology

## 2016-07-30 NOTE — Telephone Encounter (Signed)
Refill submitted on 07/30/2016.

## 2016-07-30 NOTE — Telephone Encounter (Signed)
Pt called in and needs a prescription for just one vial of Lantus sent to the [facility], and then a 90 days supply sent to his mail order pharmacy.

## 2016-07-30 NOTE — Telephone Encounter (Signed)
Patient called [facility] he need order for Lantus sent to   [facility] [place], [address] AT [facility] [address] [phone] (Phone) [phone] (Fax)

## 2016-07-30 NOTE — Telephone Encounter (Signed)
Please submit the prescription to the [name].  They are saying they still haven't received anything.

## 2016-07-31 ENCOUNTER — Other Ambulatory Visit: Payer: Self-pay

## 2016-07-31 MED ORDER — INSULIN GLARGINE 100 UNIT/ML ~~LOC~~ SOLN: SUBCUTANEOUS | 0 refills | Status: DC

## 2016-07-31 NOTE — Telephone Encounter (Signed)
Refill resubmitted.

## 2016-08-07 ENCOUNTER — Inpatient Hospital Stay (HOSPITAL_COMMUNITY): Payer: Medicare PPO

## 2016-08-07 ENCOUNTER — Encounter (HOSPITAL_COMMUNITY): Payer: Self-pay | Admitting: Emergency Medicine

## 2016-08-07 ENCOUNTER — Inpatient Hospital Stay (HOSPITAL_COMMUNITY)
Admission: EM | Admit: 2016-08-07 | Discharge: 2016-08-09 | DRG: 914 | Disposition: A | Discharge status: Home / Self Care | Payer: Medicare PPO | Attending: Family Medicine | Admitting: Family Medicine

## 2016-08-07 ENCOUNTER — Emergency Department (HOSPITAL_COMMUNITY): Payer: Medicare PPO

## 2016-08-07 DIAGNOSIS — N400 Enlarged prostate without lower urinary tract symptoms: Secondary | ICD-10-CM | POA: Diagnosis present

## 2016-08-07 DIAGNOSIS — L02511 Cutaneous abscess of right hand: Secondary | ICD-10-CM | POA: Diagnosis present

## 2016-08-07 DIAGNOSIS — Z880 Allergy status to penicillin: Secondary | ICD-10-CM

## 2016-08-07 DIAGNOSIS — L0390 Cellulitis, unspecified: Secondary | ICD-10-CM | POA: Diagnosis present

## 2016-08-07 DIAGNOSIS — Z881 Allergy status to other antibiotic agents status: Secondary | ICD-10-CM | POA: Diagnosis not present

## 2016-08-07 DIAGNOSIS — Z8249 Family history of ischemic heart disease and other diseases of the circulatory system: Secondary | ICD-10-CM | POA: Diagnosis not present

## 2016-08-07 DIAGNOSIS — R609 Edema, unspecified: Secondary | ICD-10-CM | POA: Diagnosis not present

## 2016-08-07 DIAGNOSIS — Z888 Allergy status to other drugs, medicaments and biological substances status: Secondary | ICD-10-CM

## 2016-08-07 DIAGNOSIS — B9689 Other specified bacterial agents as the cause of diseases classified elsewhere: Secondary | ICD-10-CM | POA: Diagnosis not present

## 2016-08-07 DIAGNOSIS — J449 Chronic obstructive pulmonary disease, unspecified: Secondary | ICD-10-CM | POA: Diagnosis present

## 2016-08-07 DIAGNOSIS — S61411A Laceration without foreign body of right hand, initial encounter: Secondary | ICD-10-CM | POA: Diagnosis not present

## 2016-08-07 DIAGNOSIS — E785 Hyperlipidemia, unspecified: Secondary | ICD-10-CM | POA: Diagnosis present

## 2016-08-07 DIAGNOSIS — Y93H2 Activity, gardening and landscaping: Secondary | ICD-10-CM

## 2016-08-07 DIAGNOSIS — L089 Local infection of the skin and subcutaneous tissue, unspecified: Secondary | ICD-10-CM | POA: Diagnosis not present

## 2016-08-07 DIAGNOSIS — S61401A Unspecified open wound of right hand, initial encounter: Secondary | ICD-10-CM | POA: Diagnosis not present

## 2016-08-07 DIAGNOSIS — R0602 Shortness of breath: Secondary | ICD-10-CM | POA: Diagnosis not present

## 2016-08-07 DIAGNOSIS — M5136 Other intervertebral disc degeneration, lumbar region: Secondary | ICD-10-CM | POA: Diagnosis present

## 2016-08-07 DIAGNOSIS — W01118A Fall on same level from slipping, tripping and stumbling with subsequent striking against other sharp object, initial encounter: Secondary | ICD-10-CM | POA: Diagnosis present

## 2016-08-07 DIAGNOSIS — I129 Hypertensive chronic kidney disease with stage 1 through stage 4 chronic kidney disease, or unspecified chronic kidney disease: Secondary | ICD-10-CM | POA: Diagnosis present

## 2016-08-07 DIAGNOSIS — L03119 Cellulitis of unspecified part of limb: Secondary | ICD-10-CM

## 2016-08-07 DIAGNOSIS — Z66 Do not resuscitate: Secondary | ICD-10-CM | POA: Diagnosis present

## 2016-08-07 DIAGNOSIS — M79609 Pain in unspecified limb: Secondary | ICD-10-CM | POA: Diagnosis not present

## 2016-08-07 DIAGNOSIS — E1165 Type 2 diabetes mellitus with hyperglycemia: Secondary | ICD-10-CM | POA: Diagnosis not present

## 2016-08-07 DIAGNOSIS — S61421A Laceration with foreign body of right hand, initial encounter: Secondary | ICD-10-CM | POA: Diagnosis not present

## 2016-08-07 DIAGNOSIS — Z9181 History of falling: Secondary | ICD-10-CM | POA: Diagnosis not present

## 2016-08-07 DIAGNOSIS — Z87891 Personal history of nicotine dependence: Secondary | ICD-10-CM | POA: Diagnosis not present

## 2016-08-07 DIAGNOSIS — Z889 Allergy status to unspecified drugs, medicaments and biological substances status: Secondary | ICD-10-CM | POA: Diagnosis not present

## 2016-08-07 DIAGNOSIS — R600 Localized edema: Secondary | ICD-10-CM | POA: Diagnosis not present

## 2016-08-07 DIAGNOSIS — L03113 Cellulitis of right upper limb: Secondary | ICD-10-CM | POA: Diagnosis present

## 2016-08-07 DIAGNOSIS — N401 Enlarged prostate with lower urinary tract symptoms: Secondary | ICD-10-CM | POA: Diagnosis not present

## 2016-08-07 DIAGNOSIS — N184 Chronic kidney disease, stage 4 (severe): Secondary | ICD-10-CM | POA: Diagnosis present

## 2016-08-07 DIAGNOSIS — E1122 Type 2 diabetes mellitus with diabetic chronic kidney disease: Secondary | ICD-10-CM | POA: Diagnosis present

## 2016-08-07 DIAGNOSIS — M810 Age-related osteoporosis without current pathological fracture: Secondary | ICD-10-CM | POA: Diagnosis present

## 2016-08-07 DIAGNOSIS — S51011A Laceration without foreign body of right elbow, initial encounter: Secondary | ICD-10-CM | POA: Diagnosis not present

## 2016-08-07 DIAGNOSIS — Z91048 Other nonmedicinal substance allergy status: Secondary | ICD-10-CM | POA: Diagnosis not present

## 2016-08-07 DIAGNOSIS — R0600 Dyspnea, unspecified: Secondary | ICD-10-CM

## 2016-08-07 DIAGNOSIS — S60221A Contusion of right hand, initial encounter: Secondary | ICD-10-CM | POA: Diagnosis not present

## 2016-08-07 DIAGNOSIS — Z794 Long term (current) use of insulin: Secondary | ICD-10-CM | POA: Diagnosis not present

## 2016-08-07 DIAGNOSIS — Z833 Family history of diabetes mellitus: Secondary | ICD-10-CM | POA: Diagnosis not present

## 2016-08-07 LAB — BASIC METABOLIC PANEL
Anion gap: 9 (ref 5–15)
BUN: 41 mg/dL — ABNORMAL HIGH (ref 6–20)
CALCIUM: 9.4 mg/dL (ref 8.9–10.3)
CO2: 25 mmol/L (ref 22–32)
Chloride: 104 mmol/L (ref 101–111)
Creatinine, Ser: 2.28 mg/dL — ABNORMAL HIGH (ref 0.61–1.24)
GFR calc non Af Amer: 24 mL/min — ABNORMAL LOW (ref >60)
GFR, EST AFRICAN AMERICAN: 28 mL/min — AB (ref >60)
Glucose, Bld: 177 mg/dL — ABNORMAL HIGH (ref 65–99)
Potassium: 4.4 mmol/L (ref 3.5–5.1)
SODIUM: 138 mmol/L (ref 135–145)

## 2016-08-07 LAB — CBC WITH DIFFERENTIAL/PLATELET
BASOS PCT: 0 %
Basophils Absolute: 0 10*3/uL (ref 0.0–0.1)
EOS ABS: 0 10*3/uL (ref 0.0–0.7)
Eosinophils Relative: 0 %
HCT: 38.3 % — ABNORMAL LOW (ref 39.0–52.0)
Hemoglobin: 12.5 g/dL — ABNORMAL LOW (ref 13.0–17.0)
LYMPHS ABS: 1.5 10*3/uL (ref 0.7–4.0)
Lymphocytes Relative: 5 %
MCH: 31.6 pg (ref 26.0–34.0)
MCHC: 32.6 g/dL (ref 30.0–36.0)
MCV: 97 fL (ref 78.0–100.0)
MONO ABS: 2.2 10*3/uL — AB (ref 0.1–1.0)
Monocytes Relative: 7 %
Neutro Abs: 27 10*3/uL — ABNORMAL HIGH (ref 1.7–7.7)
Neutrophils Relative %: 88 %
PLATELETS: 277 10*3/uL (ref 150–400)
RBC: 3.95 MIL/uL — AB (ref 4.22–5.81)
RDW: 13.4 % (ref 11.5–15.5)
WBC: 30.7 10*3/uL — AB (ref 4.0–10.5)

## 2016-08-07 LAB — GLUCOSE, CAPILLARY
GLUCOSE-CAPILLARY: 188 mg/dL — AB (ref 65–99)
Glucose-Capillary: 231 mg/dL — ABNORMAL HIGH (ref 65–99)

## 2016-08-07 LAB — CBG MONITORING, ED: GLUCOSE-CAPILLARY: 199 mg/dL — AB (ref 65–99)

## 2016-08-07 LAB — MRSA PCR SCREENING: MRSA BY PCR: NEGATIVE

## 2016-08-07 MED ORDER — MONTELUKAST SODIUM 10 MG PO TABS
10.0000 mg | ORAL_TABLET | Freq: Every day | ORAL | Status: DC
  Administered 2016-08-07 – 2016-08-08 (×2): 10 mg via ORAL
  Filled 2016-08-07 (×2): qty 1

## 2016-08-07 MED ORDER — ADULT MULTIVITAMIN W/MINERALS CH
1.0000 | ORAL_TABLET | Freq: Every day | ORAL | Status: DC
  Administered 2016-08-08 – 2016-08-09 (×2): 1 via ORAL
  Filled 2016-08-07 (×2): qty 1

## 2016-08-07 MED ORDER — CLINDAMYCIN PHOSPHATE 600 MG/50ML IV SOLN
600.0000 mg | Freq: Once | INTRAVENOUS | Status: AC
  Administered 2016-08-07: 600 mg via INTRAVENOUS
  Filled 2016-08-07: qty 50

## 2016-08-07 MED ORDER — ONDANSETRON HCL 4 MG PO TABS: 4.0000 mg | ORAL_TABLET | Freq: Four times a day (QID) | ORAL | Status: DC | PRN

## 2016-08-07 MED ORDER — ONDANSETRON HCL 4 MG/2ML IJ SOLN: 4.0000 mg | Freq: Four times a day (QID) | INTRAMUSCULAR | Status: DC | PRN

## 2016-08-07 MED ORDER — LIDOCAINE HCL (PF) 1 % IJ SOLN
30.0000 mL | Freq: Once | INTRAMUSCULAR | Status: AC
  Administered 2016-08-07: 30 mL
  Filled 2016-08-07: qty 30

## 2016-08-07 MED ORDER — VITAMIN D3 25 MCG (1000 UNIT) PO TABS
2000.0000 [IU] | ORAL_TABLET | Freq: Every day | ORAL | Status: DC
  Administered 2016-08-08 – 2016-08-09 (×2): 2000 [IU] via ORAL
  Filled 2016-08-07 (×2): qty 2

## 2016-08-07 MED ORDER — IPRATROPIUM-ALBUTEROL 0.5-2.5 (3) MG/3ML IN SOLN
3.0000 mL | Freq: Three times a day (TID) | RESPIRATORY_TRACT | Status: DC
  Administered 2016-08-08 – 2016-08-09 (×4): 3 mL via RESPIRATORY_TRACT
  Filled 2016-08-07 (×4): qty 3

## 2016-08-07 MED ORDER — FAMOTIDINE 20 MG PO TABS
20.0000 mg | ORAL_TABLET | Freq: Every day | ORAL | Status: DC
  Administered 2016-08-07 – 2016-08-08 (×2): 20 mg via ORAL
  Filled 2016-08-07 (×2): qty 1

## 2016-08-07 MED ORDER — FERROUS SULFATE 325 (65 FE) MG PO TABS
325.0000 mg | ORAL_TABLET | Freq: Every day | ORAL | Status: DC
  Administered 2016-08-08 – 2016-08-09 (×2): 325 mg via ORAL
  Filled 2016-08-07 (×2): qty 1

## 2016-08-07 MED ORDER — PANTOPRAZOLE SODIUM 40 MG PO TBEC
40.0000 mg | DELAYED_RELEASE_TABLET | Freq: Every day | ORAL | Status: DC
  Administered 2016-08-08 – 2016-08-09 (×2): 40 mg via ORAL
  Filled 2016-08-07 (×2): qty 1

## 2016-08-07 MED ORDER — VANCOMYCIN HCL 10 G IV SOLR
2000.0000 mg | INTRAVENOUS | Status: AC
  Administered 2016-08-07: 2000 mg via INTRAVENOUS
  Filled 2016-08-07: qty 2000

## 2016-08-07 MED ORDER — MOMETASONE FURO-FORMOTEROL FUM 200-5 MCG/ACT IN AERO
2.0000 | INHALATION_SPRAY | Freq: Two times a day (BID) | RESPIRATORY_TRACT | Status: DC
  Administered 2016-08-07 – 2016-08-09 (×4): 2 via RESPIRATORY_TRACT
  Filled 2016-08-07: qty 8.8

## 2016-08-07 MED ORDER — FESOTERODINE FUMARATE ER 8 MG PO TB24
8.0000 mg | ORAL_TABLET | Freq: Every day | ORAL | Status: DC
  Administered 2016-08-07 – 2016-08-08 (×2): 8 mg via ORAL
  Filled 2016-08-07 (×2): qty 1

## 2016-08-07 MED ORDER — ENOXAPARIN SODIUM 40 MG/0.4ML ~~LOC~~ SOLN
40.0000 mg | SUBCUTANEOUS | Status: DC
  Administered 2016-08-07 – 2016-08-08 (×2): 40 mg via SUBCUTANEOUS
  Filled 2016-08-07 (×2): qty 0.4

## 2016-08-07 MED ORDER — SODIUM CHLORIDE 0.9 % IV SOLN
1500.0000 mg | INTRAVENOUS | Status: DC
  Filled 2016-08-07: qty 1500

## 2016-08-07 MED ORDER — FINASTERIDE 5 MG PO TABS
5.0000 mg | ORAL_TABLET | Freq: Every day | ORAL | Status: DC
  Administered 2016-08-07 – 2016-08-08 (×2): 5 mg via ORAL
  Filled 2016-08-07 (×2): qty 1

## 2016-08-07 MED ORDER — ACETAMINOPHEN 650 MG RE SUPP: 650.0000 mg | Freq: Four times a day (QID) | RECTAL | Status: DC | PRN

## 2016-08-07 MED ORDER — FUROSEMIDE 20 MG PO TABS
20.0000 mg | ORAL_TABLET | ORAL | Status: DC
  Administered 2016-08-08: 20 mg via ORAL
  Filled 2016-08-07: qty 1

## 2016-08-07 MED ORDER — IPRATROPIUM-ALBUTEROL 0.5-2.5 (3) MG/3ML IN SOLN: 3.0000 mL | Freq: Four times a day (QID) | RESPIRATORY_TRACT | Status: DC

## 2016-08-07 MED ORDER — INSULIN ASPART 100 UNIT/ML ~~LOC~~ SOLN
0.0000 [IU] | Freq: Every day | SUBCUTANEOUS | Status: DC
  Administered 2016-08-07 – 2016-08-08 (×2): 2 [IU] via SUBCUTANEOUS

## 2016-08-07 MED ORDER — INSULIN GLARGINE 100 UNIT/ML ~~LOC~~ SOLN
20.0000 [IU] | Freq: Every day | SUBCUTANEOUS | Status: DC
  Administered 2016-08-07: 20 [IU] via SUBCUTANEOUS
  Filled 2016-08-07 (×2): qty 0.2

## 2016-08-07 MED ORDER — IPRATROPIUM-ALBUTEROL 0.5-2.5 (3) MG/3ML IN SOLN
3.0000 mL | Freq: Four times a day (QID) | RESPIRATORY_TRACT | Status: DC
  Administered 2016-08-07: 3 mL via RESPIRATORY_TRACT
  Filled 2016-08-07: qty 3

## 2016-08-07 MED ORDER — SIMVASTATIN 10 MG PO TABS
20.0000 mg | ORAL_TABLET | Freq: Every day | ORAL | Status: DC
  Administered 2016-08-07 – 2016-08-08 (×2): 20 mg via ORAL
  Filled 2016-08-07 (×2): qty 2

## 2016-08-07 MED ORDER — INSULIN ASPART 100 UNIT/ML ~~LOC~~ SOLN
0.0000 [IU] | Freq: Three times a day (TID) | SUBCUTANEOUS | Status: DC
  Administered 2016-08-07: 3 [IU] via SUBCUTANEOUS
  Administered 2016-08-08 (×2): 5 [IU] via SUBCUTANEOUS
  Administered 2016-08-08: 2 [IU] via SUBCUTANEOUS
  Administered 2016-08-09: 3 [IU] via SUBCUTANEOUS

## 2016-08-07 MED ORDER — ACETAMINOPHEN 325 MG PO TABS
650.0000 mg | ORAL_TABLET | Freq: Four times a day (QID) | ORAL | Status: DC | PRN
  Administered 2016-08-08 – 2016-08-09 (×2): 650 mg via ORAL
  Filled 2016-08-07 (×2): qty 2

## 2016-08-07 NOTE — Consult Note (Signed)
ORTHOPAEDIC CONSULTATION HISTORY & PHYSICAL REQUESTING PHYSICIAN: Malvin Johns, MD  Chief Complaint: Right hand wound and infection  HPI: Clayton [name] is a 83 y.o. male who fell in the garden on Sunday, 08-05-16, causing a right dorsal hand laceration.  Presents today for worsening pain, swelling, redness and drainage.  Past Medical History:  Diagnosis Date  . Anemia   . Chronic renal insufficiency   . DDD (degenerative disc disease), lumbar   . Diabetes mellitus   . Hypertension   . Neuromuscular disorder ([place])   . Osteoporosis    Past Surgical History:  Procedure Laterality Date  . EYE SURGERY    . VASECTOMY    . VIDEO BRONCHOSCOPY Bilateral 08/25/2012   Procedure: VIDEO BRONCHOSCOPY WITHOUT FLUORO;  Surgeon: Brand Males, MD;  Location: [facility];  Service: Cardiopulmonary;  Laterality: Bilateral;   Social History   Social History  . Marital status: Widowed    Spouse name: N/A  . Number of children: N/A  . Years of education: N/A   Social History Main Topics  . Smoking status: Former Smoker    Packs/day: 3.00    Years: 33.00    Types: Cigarettes    Quit date: 06/19/1973  . Smokeless tobacco: Former Systems developer    Quit date: 09/26/1975  . Alcohol use No  . Drug use: No  . Sexual activity: No   Other Topics Concern  . None   Social History Narrative  . None   Family History  Problem Relation Age of Onset  . Diabetes Mellitus II    . Hypertension     Allergies  Allergen Reactions  . Nitrous Oxide Other (See Comments)    Reaction:  Unknown   . Penicillins Other (See Comments)    Reaction:  Unknown Has patient had a PCN reaction causing immediate rash, facial/tongue/throat swelling, SOB or lightheadedness with hypotension: Unsure Has patient had a PCN reaction causing severe rash involving mucus membranes or skin necrosis: Unsure Has patient had a PCN reaction that required hospitalization Unsure  Has patient had a PCN reaction occurring within the last  10 years: No If all of the above answers are "NO", then may proceed with Cephalosporin use.  . Tape Other (See Comments)    Reaction:  Tears pts skin   . Diltiazem Rash   Prior to Admission medications   Medication Sig Start Date End Date Taking? Authorizing Provider  albuterol (PROVENTIL HFA;VENTOLIN HFA) 108 (90 Base) MCG/ACT inhaler Inhale 1-2 puffs into the lungs every 6 (six) hours as needed for wheezing or shortness of breath.   Yes Historical Provider, MD  budesonide-formoterol (SYMBICORT) 160-4.5 MCG/ACT inhaler Inhale 2 puffs into the lungs 2 (two) times daily. 07/29/13  Yes Tanda Rockers, MD  cholecalciferol (VITAMIN D) 1000 units tablet Take 2,000 Units by mouth daily.   Yes Historical Provider, MD  famotidine (PEPCID) 20 MG tablet Take 20 mg by mouth at bedtime.   Yes Historical Provider, MD  ferrous sulfate 325 (65 FE) MG tablet Take 325 mg by mouth daily with breakfast.   Yes Historical Provider, MD  finasteride (PROSCAR) 5 MG tablet Take 5 mg by mouth at bedtime.   Yes Historical Provider, MD  furosemide (LASIX) 20 MG tablet Take 20 mg by mouth See admin instructions. Pt takes on Sunday, Tuesday, Thursday, and Saturday.   Yes Historical Provider, MD  insulin aspart (NOVOLOG) 100 UNIT/ML injection Inject 15-17 Units into the skin 3 (three) times daily before meals. Pt uses 15 units  before breakfast, 17 units before lunch, and 17 units before dinner.   Yes Historical Provider, MD  insulin glargine (LANTUS) 100 UNIT/ML injection Inject 20 Units into the skin 2 (two) times daily.   Yes Historical Provider, MD  montelukast (SINGULAIR) 10 MG tablet Take 10 mg by mouth at bedtime.   Yes Historical Provider, MD  Multiple Vitamin (MULTIVITAMIN WITH MINERALS) TABS tablet Take 1 tablet by mouth daily.   Yes Historical Provider, MD  omeprazole (PRILOSEC) 20 MG capsule Take 20 mg by mouth 2 (two) times daily before a meal.   Yes Historical Provider, MD  psyllium (REGULOID) 0.52 g capsule Take  0.52 g by mouth daily.   Yes Historical Provider, MD  simvastatin (ZOCOR) 20 MG tablet Take 20 mg by mouth at bedtime.   Yes Historical Provider, MD  TOVIAZ 8 MG TB24 tablet Take 8 mg by mouth at bedtime.    Yes Historical Provider, MD   Dg Hand Complete Right  Result Date: 08/07/2016 CLINICAL DATA:  Recent fall and hit the posterior right hand with laceration. Patient now has increased pain and redness. EXAM: RIGHT HAND - COMPLETE 3+ VIEW COMPARISON:  09/25/2015 FINDINGS: Stable cortical thickening and deformity of the distal radius compatible with an old fracture. Stable narrowing at the radiocarpal joint. Chronic osteoarthritic changes at the first carpometacarpal joint. Atherosclerotic calcifications along the volar aspect of the wrist. There is no evidence for an acute fracture or dislocation. There appears to be soft tissue swelling along the dorsum of the hand and wrist. IMPRESSION: No acute bone abnormality to the right wrist. Chronic changes as described. Electronically Signed   By: Markus Daft M.D.   On: 08/07/2016 10:29    Positive ROS: All other systems have been reviewed and were otherwise negative with the exception of those mentioned in the HPI and as above.  Physical Exam: Vitals: Refer to EMR. Constitutional:  WD, WN, NAD HEENT:  NCAT, EOMI Neuro/Psych:  Alert & oriented to person, place, and time; appropriate mood & affect Lymphatic: No generalized extremity edema or lymphadenopathy Extremities / MSK:  The extremities are normal with respect to appearance, ranges of motion, joint stability, muscle strength/tone, sensation, & perfusion except as otherwise noted:  Right hand with chronic RF mallet deformity, old scars from FA osteomyelitis, and laceration dorsal 4th web space.  Skin flap on ulnar side appears macerated, with gross purulence expressible from under the flap. No extensor tendon visible. TTP with swelling SF prox phalanx region.  No significant pain wit passive ROM of  RF and SF MCP. Some redness extending up FA dorsally  Assessment: R hand laceration with subcutaneous abscess  Recommendations: Discussed care with Dr. Tamera Punt.  I recommended bedside I&D, with local anesthetic, so that non-viable tissue can be exicised, and the wound explored and washed copiously, leaving packing and beginning appropriate wound care as inpatient.  I recommend wound closure by secondary intent.  Patient is being admitted to hospitalists for medical infection management.   I remain available as needed for any surgical issues of the hand as they might arise.  Rayvon Char Grandville Silos, [place], [place]  [address] Office: [phone] Mobile: [phone]  08/07/2016, 12:31 PM

## 2016-08-07 NOTE — Progress Notes (Signed)
Pharmacy Antibiotic Note  Clayton [name] is a 83 y.o. male admitted on 08/07/2016 with right hand cellulitis.  Received Clindamycin '600mg'$  IV x 1 dose in the ED.  Pharmacy has been consulted for Vancomycin dosing.  Plan:  Vancomycin 2gm IV x 1 dose followed by '1500mg'$  IV q48h  Vancomycin trough goal: 10-15 mcg/ml  Follow SCr daily while on Vancomycin  F/U cultures  Weight: 233 lb 0.4 oz (105.7 kg)  Temp (24hrs), Avg:98.9 F (37.2 C), Min:98.2 F (36.8 C), Max:99.3 F (37.4 C)   Recent Labs Lab 08/07/16 1115  WBC 30.7*  CREATININE 2.28*    Estimated Creatinine Clearance: 28.3 mL/min (by C-G formula based on SCr of 2.28 mg/dL (H)).    Allergies  Allergen Reactions  . Nitrous Oxide Other (See Comments)    Reaction:  Unknown   . Penicillins Other (See Comments)    Reaction:  Unknown Has patient had a PCN reaction causing immediate rash, facial/tongue/throat swelling, SOB or lightheadedness with hypotension: Unsure Has patient had a PCN reaction causing severe rash involving mucus membranes or skin necrosis: Unsure Has patient had a PCN reaction that required hospitalization Unsure  Has patient had a PCN reaction occurring within the last 10 years: No If all of the above answers are "NO", then may proceed with Cephalosporin use.  . Tape Other (See Comments)    Reaction:  Tears pts skin   . Diltiazem Rash    Antimicrobials this admission: 2/20 Clinda x 1  2/20 Vanc >>    Dose adjustments this admission:    Microbiology results: 2/20 BCx: sent  Thank you for allowing pharmacy to be a part of this patient's care.  Everette Rank, PharmD 08/07/2016 4:39 PM

## 2016-08-07 NOTE — ED Notes (Signed)
Report given to 5E 

## 2016-08-07 NOTE — H&P (Signed)
History and Physical  UNO ESAU IOM:[id number] DOB: 27-Nov-1927 DOA: 08/07/2016   PCP: Lujean Amel, MD   Patient coming from: Home  Chief Complaint: right hand pain  HPI:  Clayton [name] is a 83 y.o. male with medical history of hypertension, CKD stage IV, diabetes mellitus presenting with one to two-day history of increasing pain and erythema of the right hand. The patient tripped and fell while working in his backyard on 08/05/2016 causing a laceration of his right hand. Over the past 24 hours, he has noted increasing swelling, pain, erythema on the dorsum of his right hand streaking up into the elbow. As a result, he presented for further evaluation. The patient denies any fevers, chills, chest pain, shortness breath, vomiting, diarrhea, abdominal pain. The patient denies any dizziness or syncope. The patient normally ambulates with a cane.  In the emergency department, the patient was afebrile and hemodynamically stable saturating well on room air. WBC was 30.7. BMP showed a serum creatinine 2.28. X-ray of the right hand was negative for any acute process. Blood cultures were obtained, the patient was given a dose of clindamycin. Hand surgery, Dr. Grandville Silos from [facility] was consulted.  Assessment/Plan: Cellulitis of the right upper extremity -The patient has had extensive and numerous exposures to antibiotics in the recent past--> therefore start vancomycin IV  -Hand Surgery, Dr. Thompson--recommended bedside I&D and inpatient wound care; recommending closure by secondary intent  -MRI if no improvement  -am CBC  Intermittent dyspnea and rhonchi -Obtain chest x-ray -??underlying COPD -Speech therapy evaluation -Start bronchodilators -Continue Symbicort -Saturating well on room air  Diabetes mellitus type 2 -Start half home dose Lantus -NovoLog sliding scale  CKD stage IV -Baseline creatinine 2.3-2.6 -Presenting creatinine  2.28  Hyperlipidemia -Continue Zocor  Lower extremity edema -Venous duplex lower extremities -continue home dose lasix        Past Medical History:  Diagnosis Date  . Anemia   . Chronic renal insufficiency   . DDD (degenerative disc disease), lumbar   . Diabetes mellitus   . Hypertension   . Neuromuscular disorder ([place])   . Osteoporosis    Past Surgical History:  Procedure Laterality Date  . EYE SURGERY    . VASECTOMY    . VIDEO BRONCHOSCOPY Bilateral 08/25/2012   Procedure: VIDEO BRONCHOSCOPY WITHOUT FLUORO;  Surgeon: Brand Males, MD;  Location: [facility];  Service: Cardiopulmonary;  Laterality: Bilateral;   Social History:  reports that he quit smoking about 43 years ago. His smoking use included Cigarettes. He has a 99.00 pack-year smoking history. He quit smokeless tobacco use about 40 years ago. He reports that he does not drink alcohol or use drugs.   Family History  Problem Relation Age of Onset  . Diabetes Mellitus II    . Hypertension       Allergies  Allergen Reactions  . Nitrous Oxide Other (See Comments)    Reaction:  Unknown   . Penicillins Other (See Comments)    Reaction:  Unknown Has patient had a PCN reaction causing immediate rash, facial/tongue/throat swelling, SOB or lightheadedness with hypotension: Unsure Has patient had a PCN reaction causing severe rash involving mucus membranes or skin necrosis: Unsure Has patient had a PCN reaction that required hospitalization Unsure  Has patient had a PCN reaction occurring within the last 10 years: No If all of the above answers are "NO", then may proceed with Cephalosporin use.  . Tape Other (See Comments)    Reaction:  Tears pts skin   . Diltiazem Rash     Prior to Admission medications   Medication Sig Start Date End Date Taking? Authorizing Provider  albuterol (PROVENTIL HFA;VENTOLIN HFA) 108 (90 Base) MCG/ACT inhaler Inhale 1-2 puffs into the lungs every 6 (six) hours as needed for  wheezing or shortness of breath.   Yes Historical Provider, MD  budesonide-formoterol (SYMBICORT) 160-4.5 MCG/ACT inhaler Inhale 2 puffs into the lungs 2 (two) times daily. 07/29/13  Yes Tanda Rockers, MD  cholecalciferol (VITAMIN D) 1000 units tablet Take 2,000 Units by mouth daily.   Yes Historical Provider, MD  famotidine (PEPCID) 20 MG tablet Take 20 mg by mouth at bedtime.   Yes Historical Provider, MD  ferrous sulfate 325 (65 FE) MG tablet Take 325 mg by mouth daily with breakfast.   Yes Historical Provider, MD  finasteride (PROSCAR) 5 MG tablet Take 5 mg by mouth at bedtime.   Yes Historical Provider, MD  furosemide (LASIX) 20 MG tablet Take 20 mg by mouth See admin instructions. Pt takes on Sunday, Tuesday, Thursday, and Saturday.   Yes Historical Provider, MD  insulin aspart (NOVOLOG) 100 UNIT/ML injection Inject 15-17 Units into the skin 3 (three) times daily before meals. Pt uses 15 units before breakfast, 17 units before lunch, and 17 units before dinner.   Yes Historical Provider, MD  insulin glargine (LANTUS) 100 UNIT/ML injection Inject 20 Units into the skin 2 (two) times daily.   Yes Historical Provider, MD  montelukast (SINGULAIR) 10 MG tablet Take 10 mg by mouth at bedtime.   Yes Historical Provider, MD  Multiple Vitamin (MULTIVITAMIN WITH MINERALS) TABS tablet Take 1 tablet by mouth daily.   Yes Historical Provider, MD  omeprazole (PRILOSEC) 20 MG capsule Take 20 mg by mouth 2 (two) times daily before a meal.   Yes Historical Provider, MD  psyllium (REGULOID) 0.52 g capsule Take 0.52 g by mouth daily.   Yes Historical Provider, MD  simvastatin (ZOCOR) 20 MG tablet Take 20 mg by mouth at bedtime.   Yes Historical Provider, MD  TOVIAZ 8 MG TB24 tablet Take 8 mg by mouth at bedtime.    Yes Historical Provider, MD    Review of Systems:  Constitutional:  No weight loss, night sweats, Fevers, chills, fatigue.  Head&Eyes: No headache.  No vision loss.  No eye pain or scotoma ENT:   No Difficulty swallowing,Tooth/dental problems,Sore throat,  No ear ache, post nasal drip,  Cardio-vascular:  No chest pain, Orthopnea, PND, swelling in lower extremities,  dizziness, palpitations  GI:  No  abdominal pain, nausea, vomiting, diarrhea, loss of appetite, hematochezia, melena, heartburn, indigestion, Resp:  No shortness of breath with exertion or at rest. No cough. No coughing up of blood .No wheezing.No chest wall deformity  Skin:  no rash or lesions.  GU:  no dysuria, change in color of urine, no urgency or frequency. No flank pain.  Musculoskeletal:  No joint pain or swelling. No decreased range of motion. No back pain.  Psych:  No change in mood or affect. No depression or anxiety. Neurologic: No headache, no dysesthesia, no focal weakness, no vision loss. No syncope  Physical Exam: Vitals:   08/07/16 0941 08/07/16 1209  BP: 157/70 130/74  Pulse: 107 105  Resp: 16 18  Temp: 98.2 F (36.8 C)   TempSrc: Oral   SpO2: 97% 95%   General:  A&O x 3, NAD, nontoxic, pleasant/cooperative Head/Eye: No conjunctival hemorrhage, no icterus, M[place]/AT, No nystagmus ENT:  No icterus,  No thrush, good dentition, no pharyngeal exudate Neck:  No masses, no lymphadenpathy, no bruits CV:  RRR, no rub, no gallop, no S3 Lung:  Bilateral rhonchi, no wheezing Abdomen: soft/NT, +BS, nondistended, no peritoneal signs Ext: No cyanosis, No rashes, No petechiae, No lymphangitis; right hand with swelling isolated mostly on the dorsal aspect with erythema extending from the on her aspect streaking up into the elbow area. There is a laceration of the skin with some yellow drainage at the metacarpal phalangeal joint area. There is devitalized skin at this area.  Neuro: CNII-XII intact, strength 4/5 in bilateral upper and lower extremities, no dysmetria  Labs on Admission:  Basic Metabolic Panel:  Recent Labs Lab 08/07/16 1115  NA 138  K 4.4  CL 104  CO2 25  GLUCOSE 177*  BUN 41*   CREATININE 2.28*  CALCIUM 9.4   Liver Function Tests: No results for input(s): AST, ALT, ALKPHOS, BILITOT, PROT, ALBUMIN in the last 168 hours. No results for input(s): LIPASE, AMYLASE in the last 168 hours. No results for input(s): AMMONIA in the last 168 hours. CBC:  Recent Labs Lab 08/07/16 1115  WBC 30.7*  NEUTROABS 27.0*  HGB 12.5*  HCT 38.3*  MCV 97.0  PLT 277   Coagulation Profile: No results for input(s): INR, PROTIME in the last 168 hours. Cardiac Enzymes: No results for input(s): CKTOTAL, CKMB, CKMBINDEX, TROPONINI in the last 168 hours. BNP: Invalid input(s): POCBNP CBG: No results for input(s): GLUCAP in the last 168 hours. Urine analysis:    Component Value Date/Time   COLORURINE YELLOW 07/19/2015 0829   APPEARANCEUR CLEAR 07/19/2015 0829   LABSPEC 1.015 07/19/2015 0829   PHURINE 6.0 07/19/2015 0829   GLUCOSEU NEGATIVE 07/19/2015 0829   HGBUR NEGATIVE 07/19/2015 0829   BILIRUBINUR NEGATIVE 07/19/2015 0829   KETONESUR NEGATIVE 07/19/2015 0829   PROTEINUR 100 (A) 06/20/2012 0603   UROBILINOGEN 0.2 07/19/2015 0829   NITRITE NEGATIVE 07/19/2015 0829   LEUKOCYTESUR NEGATIVE 07/19/2015 0829   Sepsis Labs: '@LABRCNTIP'$ (procalcitonin:4,lacticidven:4) )No results found for this or any previous visit (from the past 240 hour(s)).   Radiological Exams on Admission: Dg Hand Complete Right  Result Date: 08/07/2016 CLINICAL DATA:  Recent fall and hit the posterior right hand with laceration. Patient now has increased pain and redness. EXAM: RIGHT HAND - COMPLETE 3+ VIEW COMPARISON:  09/25/2015 FINDINGS: Stable cortical thickening and deformity of the distal radius compatible with an old fracture. Stable narrowing at the radiocarpal joint. Chronic osteoarthritic changes at the first carpometacarpal joint. Atherosclerotic calcifications along the volar aspect of the wrist. There is no evidence for an acute fracture or dislocation. There appears to be soft tissue swelling  along the dorsum of the hand and wrist. IMPRESSION: No acute bone abnormality to the right wrist. Chronic changes as described. Electronically Signed   By: Markus Daft M.D.   On: 08/07/2016 10:29        Time spent:60 minutes Code Status:   DNR Family Communication:  Daughter updated at bedside Disposition Plan: expect 2 day hospitalization Consults called: Hand surgery--Brently Voorhis Grandville Silos DVT Prophylaxis: [place] Lovenox  Zyniah Ferraiolo, DO  [facility] Pager [phone]  If 7PM-7AM, please contact night-coverage [email or web address] Password [email or web address] 08/07/2016, 1:16 PM

## 2016-08-07 NOTE — ED Provider Notes (Signed)
[facility] DEPT Provider Note   CSN: [id number] Arrival date & time: 08/07/16  6761     History   Chief Complaint Chief Complaint  Patient presents with  . Hand Injury    HPI Clayton [name] is a 83 y.o. male.  Patient is an 79 show male with a history of diabetes, hypertension and chronic renal disease who presents with a hand wound. He states that 2 days ago he was working in the garden and slipped and fell. He cut his hand on something which he's not sure what it was. This resulted in a skin tear to his right hand. His daughter washed with water and put a dressing on. It's gotten worse since that time and she brought him to an urgent care this morning and they sent him over here for further evaluation. He's had redness that is extended up to his forearm. He denies any known fevers. They have noted a foul odor coming from the wound.      Past Medical History:  Diagnosis Date  . Anemia   . Chronic renal insufficiency   . DDD (degenerative disc disease), lumbar   . Diabetes mellitus   . Hypertension   . Neuromuscular disorder ([place])   . Osteoporosis     Patient Active Problem List   Diagnosis Date Noted  . Cellulitis 08/07/2016  . Type II or unspecified type diabetes mellitus with renal manifestations, uncontrolled(250.42) 02/03/2014  . Type II or unspecified type diabetes mellitus without mention of complication, uncontrolled 07/03/2013  . BPH (benign prostatic hyperplasia) 07/03/2013  . Pure hypercholesterolemia 04/06/2013  . Dyspnea 03/04/2013  . Type II or unspecified type diabetes mellitus without mention of complication, not stated as uncontrolled 01/01/2013  . Smoking history 07/24/2012  . Cough variant asthma 07/24/2012  . Solitary pulmonary nodule 06/27/2012  . Hypoxia 06/19/2012  . Diabetes mellitus ([place]) 06/19/2012  . Chronic kidney disease 06/19/2012  . Hard of hearing 06/19/2012  . HYPERTENSION 05/19/2010  . SLEEP APNEA 05/19/2010    Past  Surgical History:  Procedure Laterality Date  . EYE SURGERY    . VASECTOMY    . VIDEO BRONCHOSCOPY Bilateral 08/25/2012   Procedure: VIDEO BRONCHOSCOPY WITHOUT FLUORO;  Surgeon: Brand Males, MD;  Location: [facility];  Service: Cardiopulmonary;  Laterality: Bilateral;       Home Medications    Prior to Admission medications   Medication Sig Start Date End Date Taking? Authorizing Provider  albuterol (PROVENTIL HFA;VENTOLIN HFA) 108 (90 Base) MCG/ACT inhaler Inhale 1-2 puffs into the lungs every 6 (six) hours as needed for wheezing or shortness of breath.   Yes Historical Provider, MD  budesonide-formoterol (SYMBICORT) 160-4.5 MCG/ACT inhaler Inhale 2 puffs into the lungs 2 (two) times daily. 07/29/13  Yes Tanda Rockers, MD  cholecalciferol (VITAMIN D) 1000 units tablet Take 2,000 Units by mouth daily.   Yes Historical Provider, MD  famotidine (PEPCID) 20 MG tablet Take 20 mg by mouth at bedtime.   Yes Historical Provider, MD  ferrous sulfate 325 (65 FE) MG tablet Take 325 mg by mouth daily with breakfast.   Yes Historical Provider, MD  finasteride (PROSCAR) 5 MG tablet Take 5 mg by mouth at bedtime.   Yes Historical Provider, MD  furosemide (LASIX) 20 MG tablet Take 20 mg by mouth See admin instructions. Pt takes on Sunday, Tuesday, Thursday, and Saturday.   Yes Historical Provider, MD  insulin aspart (NOVOLOG) 100 UNIT/ML injection Inject 15-17 Units into the skin 3 (three) times daily  before meals. Pt uses 15 units before breakfast, 17 units before lunch, and 17 units before dinner.   Yes Historical Provider, MD  insulin glargine (LANTUS) 100 UNIT/ML injection Inject 20 Units into the skin 2 (two) times daily.   Yes Historical Provider, MD  montelukast (SINGULAIR) 10 MG tablet Take 10 mg by mouth at bedtime.   Yes Historical Provider, MD  Multiple Vitamin (MULTIVITAMIN WITH MINERALS) TABS tablet Take 1 tablet by mouth daily.   Yes Historical Provider, MD  omeprazole (PRILOSEC) 20 MG  capsule Take 20 mg by mouth 2 (two) times daily before a meal.   Yes Historical Provider, MD  psyllium (REGULOID) 0.52 g capsule Take 0.52 g by mouth daily.   Yes Historical Provider, MD  simvastatin (ZOCOR) 20 MG tablet Take 20 mg by mouth at bedtime.   Yes Historical Provider, MD  TOVIAZ 8 MG TB24 tablet Take 8 mg by mouth at bedtime.    Yes Historical Provider, MD    Family History Family History  Problem Relation Age of Onset  . Diabetes Mellitus II    . Hypertension      Social History Social History  Substance Use Topics  . Smoking status: Former Smoker    Packs/day: 3.00    Years: 33.00    Types: Cigarettes    Quit date: 06/19/1973  . Smokeless tobacco: Former Systems developer    Quit date: 09/26/1975  . Alcohol use No     Allergies   Nitrous oxide; Penicillins; Tape; and Diltiazem   Review of Systems Review of Systems  Constitutional: Negative for chills, diaphoresis, fatigue and fever.  HENT: Negative for congestion, rhinorrhea and sneezing.   Eyes: Negative.   Respiratory: Negative for cough, chest tightness and shortness of breath.   Cardiovascular: Negative for chest pain and leg swelling.  Gastrointestinal: Negative for abdominal pain, blood in stool, diarrhea, nausea and vomiting.  Genitourinary: Negative for difficulty urinating, flank pain, frequency and hematuria.  Musculoskeletal: Negative for arthralgias and back pain.  Skin: Positive for color change and wound. Negative for rash.  Neurological: Negative for dizziness, speech difficulty, weakness, numbness and headaches.     Physical Exam Updated Vital Signs BP 130/74 (BP Location: Left Arm)   Pulse 105   Temp 98.2 F (36.8 C) (Oral)   Resp 18   SpO2 95%   Physical Exam  Constitutional: He is oriented to person, place, and time. He appears well-developed and well-nourished.  HENT:  Head: Normocephalic and atraumatic.  Eyes: Pupils are equal, round, and reactive to light.  Neck: Normal range of motion.  Neck supple.  Cardiovascular: Normal rate, regular rhythm and normal heart sounds.   Pulmonary/Chest: Effort normal and breath sounds normal. No respiratory distress. He has no wheezes. He has no rales. He exhibits no tenderness.  Rhonchi present  Abdominal: Soft. Bowel sounds are normal. There is no tenderness. There is no rebound and no guarding.  Musculoskeletal: Normal range of motion. He exhibits no edema.  Patient has a U-shaped skin tear to the dorsum of his right hand overlying the fourth and fifth digits. The skin flap appears to be nonviable.  There is a small amount of purulent drainage. On lifting up the skin flap, there is grass and dirt under the wound. He has redness surrounding the wound and extending up to the mid forearm. He has exquisite tenderness with movement of the fourth and fifth digits.  Lymphadenopathy:    He has no cervical adenopathy.  Neurological: He is alert  and oriented to person, place, and time.  Skin: Skin is warm and dry. No rash noted.  Psychiatric: He has a normal mood and affect.       ED Treatments / Results  Labs (all labs ordered are listed, but only abnormal results are displayed) Labs Reviewed  BASIC METABOLIC PANEL - Abnormal; Notable for the following:       Result Value   Glucose, Bld 177 (*)    BUN 41 (*)    Creatinine, Ser 2.28 (*)    GFR calc non Af Amer 24 (*)    GFR calc Af Amer 28 (*)    All other components within normal limits  CBC WITH DIFFERENTIAL/PLATELET - Abnormal; Notable for the following:    WBC 30.7 (*)    RBC 3.95 (*)    Hemoglobin 12.5 (*)    HCT 38.3 (*)    Neutro Abs 27.0 (*)    Monocytes Absolute 2.2 (*)    All other components within normal limits  CULTURE, BLOOD (ROUTINE X 2)  CULTURE, BLOOD (ROUTINE X 2)    EKG  EKG Interpretation None       Radiology Dg Hand Complete Right  Result Date: 08/07/2016 CLINICAL DATA:  Recent fall and hit the posterior right hand with laceration. Patient now has  increased pain and redness. EXAM: RIGHT HAND - COMPLETE 3+ VIEW COMPARISON:  09/25/2015 FINDINGS: Stable cortical thickening and deformity of the distal radius compatible with an old fracture. Stable narrowing at the radiocarpal joint. Chronic osteoarthritic changes at the first carpometacarpal joint. Atherosclerotic calcifications along the volar aspect of the wrist. There is no evidence for an acute fracture or dislocation. There appears to be soft tissue swelling along the dorsum of the hand and wrist. IMPRESSION: No acute bone abnormality to the right wrist. Chronic changes as described. Electronically Signed   By: Markus Daft M.D.   On: 08/07/2016 10:29    Procedures .Marland KitchenIncision and Drainage Date/Time: 08/07/2016 1:15 PM Performed by: Gisel Vipond Authorized by: Malvin Johns   Consent:    Consent obtained:  Verbal   Consent given by:  Patient   Risks discussed:  Incomplete drainage, infection and bleeding   Alternatives discussed:  No treatment Location:    Indications for incision and drainage: wound infection.   Location:  Upper extremity   Upper extremity location:  Hand   Hand location:  R hand Anesthesia (see MAR for exact dosages):    Anesthesia method:  Local infiltration   Local anesthetic:  Lidocaine 1% w/o epi Procedure type:    Complexity:  Simple Procedure details:    Scalpel blade:  11   Wound management:  Probed and deloculated and debrided   Drainage:  Purulent   Drainage amount:  Scant   Wound treatment:  Wound left open   Packing materials:  None Post-procedure details:    Patient tolerance of procedure:  Tolerated well, no immediate complications Comments:     Nonviable tissue was debrided and wound was explored. It was irrigated with copious amounts of normal saline. Dirt and grass was removed from the wound. Dressing was applied.   (including critical care time)  Medications Ordered in ED Medications  clindamycin (CLEOCIN) IVPB 600 mg (0 mg  Intravenous Stopped 08/07/16 1206)  lidocaine (PF) (XYLOCAINE) 1 % injection 30 mL (30 mLs Infiltration Given by Other 08/07/16 1208)     Initial Impression / Assessment and Plan / ED Course  I have reviewed the triage vital signs and  the nursing notes.  Pertinent labs & imaging results that were available during my care of the patient were reviewed by me and considered in my medical decision making (see chart for details).     Patient presents with a hand infection. I spoke with Dr. Grandville Silos with hand surgery who requested that I debride the tissue. I spoke with Dr. Carles Collet with the hospitalist service to admit the patient for further antibiotics. He was given clindamycin in the ED. His tetanus shot is up-to-date.  Final Clinical Impressions(s) / ED Diagnoses   Final diagnoses:  Cellulitis of hand    New Prescriptions New Prescriptions   No medications on file     Malvin Johns, MD 08/07/16 1318

## 2016-08-07 NOTE — ED Notes (Signed)
2 sets of antibiotics collected prior to antibiotic administration

## 2016-08-07 NOTE — ED Notes (Signed)
Hospitalist at bedside 

## 2016-08-07 NOTE — Progress Notes (Signed)
Inpatient Diabetes Program Recommendations  AACE/ADA: New Consensus Statement on Inpatient Glycemic Control (2015)  Target Ranges:  Prepandial:   less than 140 mg/dL      Peak postprandial:   less than 180 mg/dL (1-2 hours)      Critically ill patients:  140 - 180 mg/dL   Lab Results  Component Value Date   GLUCAP 188 (H) 08/07/2016   HGBA1C 7.0 (H) 07/11/2016    Review of Glycemic Control  Diabetes history: DM2 Outpatient Diabetes medications: Lantus 20 units bid, Novolog 15-17-17 tid Current orders for Inpatient glycemic control: Lantus 20 units QHS, Novolog 0-15 units tidwc and hs  HgbA1C indicates good control at home.  RN requested this Diabetes Coordinator to speak with pt about his insulin orders here in the hospital.Explained that he will be eating differently while in the hospital, therefore his insulin needs are not the same as home needs. Pt finallly stated, "Just do what you want."   If FBS > 180 in am, increase Lantus to home dose. Will likely need meal coverage insulin - Novolog 4 units tidwc.  Will follow closely. Thank you. Lorenda Peck, RD, LDN, CDE Inpatient Diabetes Coordinator [phone]

## 2016-08-07 NOTE — ED Triage Notes (Signed)
patient fell in yard on Sunday. Family tried cleaning and wrapping up patient's hand. patient wen to urgent care this morning due to pain keeps increasing and sent here due to infected on top of hand and odor. Patient family put Vaseline gauze on it.  Patient is diabetic.

## 2016-08-08 ENCOUNTER — Inpatient Hospital Stay (HOSPITAL_COMMUNITY): Payer: Medicare PPO

## 2016-08-08 DIAGNOSIS — N401 Enlarged prostate with lower urinary tract symptoms: Secondary | ICD-10-CM

## 2016-08-08 DIAGNOSIS — R609 Edema, unspecified: Secondary | ICD-10-CM

## 2016-08-08 DIAGNOSIS — M79609 Pain in unspecified limb: Secondary | ICD-10-CM

## 2016-08-08 LAB — CBC
HEMATOCRIT: 31.5 % — AB (ref 39.0–52.0)
HEMOGLOBIN: 10.6 g/dL — AB (ref 13.0–17.0)
MCH: 31.7 pg (ref 26.0–34.0)
MCHC: 33.7 g/dL (ref 30.0–36.0)
MCV: 94.3 fL (ref 78.0–100.0)
Platelets: 253 10*3/uL (ref 150–400)
RBC: 3.34 MIL/uL — ABNORMAL LOW (ref 4.22–5.81)
RDW: 13.3 % (ref 11.5–15.5)
WBC: 23.7 10*3/uL — AB (ref 4.0–10.5)

## 2016-08-08 LAB — BASIC METABOLIC PANEL
ANION GAP: 6 (ref 5–15)
BUN: 44 mg/dL — AB (ref 6–20)
CHLORIDE: 107 mmol/L (ref 101–111)
CO2: 26 mmol/L (ref 22–32)
Calcium: 8.7 mg/dL — ABNORMAL LOW (ref 8.9–10.3)
Creatinine, Ser: 2.41 mg/dL — ABNORMAL HIGH (ref 0.61–1.24)
GFR calc Af Amer: 26 mL/min — ABNORMAL LOW (ref >60)
GFR calc non Af Amer: 23 mL/min — ABNORMAL LOW (ref >60)
Glucose, Bld: 133 mg/dL — ABNORMAL HIGH (ref 65–99)
POTASSIUM: 3.9 mmol/L (ref 3.5–5.1)
SODIUM: 139 mmol/L (ref 135–145)

## 2016-08-08 LAB — GLUCOSE, CAPILLARY
GLUCOSE-CAPILLARY: 216 mg/dL — AB (ref 65–99)
GLUCOSE-CAPILLARY: 223 mg/dL — AB (ref 65–99)
Glucose-Capillary: 139 mg/dL — ABNORMAL HIGH (ref 65–99)
Glucose-Capillary: 218 mg/dL — ABNORMAL HIGH (ref 65–99)

## 2016-08-08 MED ORDER — MUPIROCIN 2 % EX OINT: TOPICAL_OINTMENT | Freq: Two times a day (BID) | CUTANEOUS | Status: DC

## 2016-08-08 MED ORDER — INSULIN GLARGINE 100 UNIT/ML ~~LOC~~ SOLN
20.0000 [IU] | Freq: Two times a day (BID) | SUBCUTANEOUS | Status: DC
  Administered 2016-08-08 – 2016-08-09 (×2): 20 [IU] via SUBCUTANEOUS
  Filled 2016-08-08 (×3): qty 0.2

## 2016-08-08 MED ORDER — SENNA 8.6 MG PO TABS
1.0000 | ORAL_TABLET | Freq: Every day | ORAL | Status: DC
  Administered 2016-08-08 – 2016-08-09 (×2): 8.6 mg via ORAL
  Filled 2016-08-08 (×2): qty 1

## 2016-08-08 MED ORDER — MUPIROCIN 2 % EX OINT
TOPICAL_OINTMENT | Freq: Two times a day (BID) | CUTANEOUS | Status: DC
  Administered 2016-08-08 (×2): via TOPICAL
  Filled 2016-08-08: qty 22

## 2016-08-08 NOTE — Progress Notes (Signed)
PROGRESS NOTE  Clayton [name]  HMC:[id number] DOB: 02/26/1928 DOA: 08/07/2016 PCP: Lujean Amel, MD   Brief Narrative: Clayton [name] is a 83 y.o. male with medical history of HTN, CKD stage IV, and IDDM who presented with two-day history of increasing pain and erythema of the right han following a laceration on 2/18. In the emergency department, the patient was afebrile and hemodynamically stable saturating well on room air. WBC was 30.7. BMP showed a serum creatinine 2.28, near baseline. X-ray of the right hand was negative for any acute process. Blood cultures were obtained, the patient was given a dose of clindamycin. Hand surgery, Dr. Grandville Silos from [facility] orthopedics was consulted and recommended I&D as well as allowing wound to heal by secondary intention. I&D was performed by the EDP and the patient was admitted for further management.  Assessment & Plan: Active Problems:   Smoking history   Cellulitis   Type 2 diabetes mellitus with hyperglycemia, with long-term current use of insulin (HCC)   Cellulitis of right upper extremity   Chronic kidney disease (CKD), stage IV (severe) (HCC)  Cellulitis and abscess of the right hand: Evaluated by hand surgery, Dr. Grandville Silos, in the ED who recommended I&D, performed by EDP 2/20, and to let heal by secondary intention.  - Continue empiric vancomycin - Bactroban and dressing change BID. - Will get MRI to evaluate for deeper infection, specifically concerned for myositis/extensor tendon involvement. Will re-consult surgery if necessary. - Monitor CBC  Chronic bronchitis: Evidenced by CXR.  - Continue symbicort, bronchodilators, and intermittent pulse oximetry.   IDT2DM: Good control boding well for recovery with most recent HbA1c 7.0%. States he has better control of CBGs at home than here.  - Start full home dose Lantus (20u BID; unsure why this is split dose) - Continue Mod SSI (takes 15u, 17u, 17u WC at home)  CKD stage IV: Baseline  creatinine 2.3-2.6 - Presenting creatinine 2.28. Will monitor while on vancomycin, renally dosed.   Hyperlipidemia - Continue Zocor  Lower extremity edema: Stable. Venous duplex negative for DVT - Continue home lasix.  DVT prophylaxis: Lovenox Code Status: DNR Family Communication: None at bedside Disposition Plan: Anticipate DC home once infection improving and pending MRI showing no need for further surgical management.  Consultants:   Hand surgery, Dr. Grandville Silos.   Procedures:   Right hand I&D 08/07/2016 by EDP, Dr. Tamera Punt.   Antimicrobials:  Clindamycin x1 2/20  Vancomycin 2/20 >> (q48h)  Subjective: Pt with ongoing pain in the right hand controlled at this time. No fevers or chills, chest pain or dyspnea.   Objective: Vitals:   08/07/16 1632 08/07/16 2004 08/07/16 2120 08/08/16 0529  BP:   (!) 117/46 (!) 105/49  Pulse:   87 82  Resp:   18 18  Temp:   99.1 F (37.3 C) 98.6 F (37 C)  TempSrc:   Oral Oral  SpO2:  96% 94% 97%  Weight: 105.7 kg (233 lb 0.4 oz)       Intake/Output Summary (Last 24 hours) at 08/08/16 1344 Last data filed at 08/08/16 1140  Gross per 24 hour  Intake              540 ml  Output                0 ml  Net              540 ml   Filed Weights   08/07/16 1632  Weight: 105.7 kg (233 lb  0.4 oz)    Examination: General exam: Pleasant, elderly male in no distress Respiratory system: Non-labored breathing room air. Clear to auscultation bilaterally.  Cardiovascular system: Regular rate and rhythm. No murmur, rub, or gallop. No JVD, and no pedal edema. Gastrointestinal system: Abdomen soft, non-tender, non-distended, with normoactive bowel sounds. No organomegaly or masses felt. Central nervous system: Alert and oriented. No focal neurological deficits. Extremities: Right hand with tenderness to palpation over area near wound, very weak but intact extension and flexion in 4th and 5th digit. Radial pulse 2+, cap refill brisk throughout,  sensation intact to light touch.  Skin: Dorsal right hand with swelling extending into 4th and 5th proximal digits with erythema extending just past the wrist. Linear laceration with dry wound bed overlying the course of 4th extensor tendon.  Psychiatry: Judgement and insight appear normal. Mood & affect appropriate.   Data Reviewed: I have personally reviewed following labs and imaging studies  CBC:  Recent Labs Lab 08/07/16 1115 08/08/16 0559  WBC 30.7* 23.7*  NEUTROABS 27.0*  --   HGB 12.5* 10.6*  HCT 38.3* 31.5*  MCV 97.0 94.3  PLT 277 366   Basic Metabolic Panel:  Recent Labs Lab 08/07/16 1115 08/08/16 0559  NA 138 139  K 4.4 3.9  CL 104 107  CO2 25 26  GLUCOSE 177* 133*  BUN 41* 44*  CREATININE 2.28* 2.41*  CALCIUM 9.4 8.7*   GFR: Estimated Creatinine Clearance: 26.8 mL/min (by C-G formula based on SCr of 2.41 mg/dL (H)). Liver Function Tests: No results for input(s): AST, ALT, ALKPHOS, BILITOT, PROT, ALBUMIN in the last 168 hours. No results for input(s): LIPASE, AMYLASE in the last 168 hours. No results for input(s): AMMONIA in the last 168 hours. Coagulation Profile: No results for input(s): INR, PROTIME in the last 168 hours. Cardiac Enzymes: No results for input(s): CKTOTAL, CKMB, CKMBINDEX, TROPONINI in the last 168 hours. BNP (last 3 results) No results for input(s): PROBNP in the last 8760 hours. HbA1C: No results for input(s): HGBA1C in the last 72 hours. CBG:  Recent Labs Lab 08/07/16 1430 08/07/16 1622 08/07/16 2130 08/08/16 0740 08/08/16 1124  GLUCAP 199* 188* 231* 139* 216*   Lipid Profile: No results for input(s): CHOL, HDL, LDLCALC, TRIG, CHOLHDL, LDLDIRECT in the last 72 hours. Thyroid Function Tests: No results for input(s): TSH, T4TOTAL, FREET4, T3FREE, THYROIDAB in the last 72 hours. Anemia Panel: No results for input(s): VITAMINB12, FOLATE, FERRITIN, TIBC, IRON, RETICCTPCT in the last 72 hours. Urine analysis:    Component  Value Date/Time   COLORURINE YELLOW 07/19/2015 0829   APPEARANCEUR CLEAR 07/19/2015 0829   LABSPEC 1.015 07/19/2015 0829   PHURINE 6.0 07/19/2015 0829   GLUCOSEU NEGATIVE 07/19/2015 0829   HGBUR NEGATIVE 07/19/2015 0829   BILIRUBINUR NEGATIVE 07/19/2015 0829   KETONESUR NEGATIVE 07/19/2015 0829   PROTEINUR 100 (A) 06/20/2012 0603   UROBILINOGEN 0.2 07/19/2015 0829   NITRITE NEGATIVE 07/19/2015 0829   LEUKOCYTESUR NEGATIVE 07/19/2015 0829   Recent Results (from the past 240 hour(s))  Culture, blood (routine x 2)     Status: None (Preliminary result)   Collection Time: 08/07/16 11:15 AM  Result Value Ref Range Status   Specimen Description BLOOD LEFT ANTECUBITAL  Final   Special Requests BOTTLES DRAWN AEROBIC AND ANAEROBIC 5CC  Final   Culture   Final    NO GROWTH < 24 HOURS Performed at [facility], [place] [address], [place], [place] [address]    Report Status PENDING  Incomplete  Culture, blood (routine x 2)     Status: None (Preliminary result)   Collection Time: 08/07/16 11:19 AM  Result Value Ref Range Status   Specimen Description BLOOD LEFT WRIST  Final   Special Requests BOTTLES DRAWN AEROBIC ONLY 5ML  Final   Culture   Final    NO GROWTH < 24 HOURS Performed at [facility], [place] [address] [place], [place] [address]    Report Status PENDING  Incomplete  MRSA PCR Screening     Status: None   Collection Time: 08/07/16 10:08 PM  Result Value Ref Range Status   MRSA by PCR NEGATIVE NEGATIVE Final    Comment:        The GeneXpert MRSA Assay (FDA approved for NASAL specimens only), is one component of a comprehensive MRSA colonization surveillance program. It is not intended to diagnose MRSA infection nor to guide or monitor treatment for MRSA infections.       Radiology Studies: Dg Chest 2 View  Result Date: 08/07/2016 CLINICAL DATA:  Dyspnea and rhonchi.  Shortness of breath. EXAM: CHEST  2 VIEW COMPARISON:  CT of the chest 12/08/2015 FINDINGS:  Cardiomediastinal silhouette is normal. Mediastinal contours appear intact. There is no evidence of focal airspace consolidation, pleural effusion or pneumothorax. Bilateral peribronchial thickening with lower lobe predominance with mild bronchiectasis. Osseous structures are without acute abnormality. Soft tissues are grossly normal. IMPRESSION: Lower lobe bronchiectasis with mild peribronchial thickening, thought to represent chronic bronchitis. No evidence of lobar consolidation. Electronically Signed   By: Fidela Salisbury M.D.   On: 08/07/2016 20:06   Dg Hand Complete Right  Result Date: 08/07/2016 CLINICAL DATA:  Recent fall and hit the posterior right hand with laceration. Patient now has increased pain and redness. EXAM: RIGHT HAND - COMPLETE 3+ VIEW COMPARISON:  09/25/2015 FINDINGS: Stable cortical thickening and deformity of the distal radius compatible with an old fracture. Stable narrowing at the radiocarpal joint. Chronic osteoarthritic changes at the first carpometacarpal joint. Atherosclerotic calcifications along the volar aspect of the wrist. There is no evidence for an acute fracture or dislocation. There appears to be soft tissue swelling along the dorsum of the hand and wrist. IMPRESSION: No acute bone abnormality to the right wrist. Chronic changes as described. Electronically Signed   By: Markus Daft M.D.   On: 08/07/2016 10:29    Scheduled Meds: . cholecalciferol  2,000 Units Oral Daily  . enoxaparin (LOVENOX) injection  40 mg Subcutaneous Q24H  . famotidine  20 mg Oral QHS  . ferrous sulfate  325 mg Oral Q breakfast  . fesoterodine  8 mg Oral QHS  . finasteride  5 mg Oral QHS  . furosemide  20 mg Oral See admin instructions  . insulin aspart  0-15 Units Subcutaneous TID WC  . insulin aspart  0-5 Units Subcutaneous QHS  . insulin glargine  20 Units Subcutaneous QHS  . ipratropium-albuterol  3 mL Nebulization TID  . mometasone-formoterol  2 puff Inhalation BID  .  montelukast  10 mg Oral QHS  . multivitamin with minerals  1 tablet Oral Daily  . mupirocin ointment   Topical BID  . pantoprazole  40 mg Oral Daily  . simvastatin  20 mg Oral QHS  . [START ON 08/09/2016] vancomycin  1,500 mg Intravenous Q48H   Continuous Infusions:   LOS: 1 day   Time spent: 25 minutes.  Vance Gather, MD [facility] Pager [phone]  If 7PM-7AM, please contact night-coverage [email or web address] Password  [email or web address] 08/08/2016, 1:44 PM

## 2016-08-08 NOTE — Evaluation (Signed)
Clinical/Bedside Swallow Evaluation Patient Details  Name: Clayton [name] MRN: [id number] Date of Birth: 12/29/1927  Today's Date: 08/08/2016 Time: SLP Start Time (ACUTE ONLY): 1435 SLP Stop Time (ACUTE ONLY): 1449 SLP Time Calculation (min) (ACUTE ONLY): 14 min  Past Medical History:  Past Medical History:  Diagnosis Date  . Anemia   . Chronic renal insufficiency   . DDD (degenerative disc disease), lumbar   . Diabetes mellitus   . Hypertension   . Neuromuscular disorder ([place])   . Osteoporosis    Past Surgical History:  Past Surgical History:  Procedure Laterality Date  . EYE SURGERY    . VASECTOMY    . VIDEO BRONCHOSCOPY Bilateral 08/25/2012   Procedure: VIDEO BRONCHOSCOPY WITHOUT FLUORO;  Surgeon: Brand Males, MD;  Location: [facility];  Service: Cardiopulmonary;  Laterality: Bilateral;   HPI:  83 y.o.malewith medical history of hypertension, CKD stage IV, diabetes mellitus presenting with one to two-day history of increasing pain and erythema of the right hand.  Dx cellulitis RUE, intermittent dyspnea and rhonchi. April 2017 CT cervical spine - mild degenerative change along the cervical spine.   Assessment / Plan / Recommendation Clinical Impression  Pt presents with functional oropharyngeal swallow with adequate mastication, brisk swallow response, no overt s/s of aspiration.  Passed three oz water test.  Good swallow/respiratory reciprocity.  Per MBS last year at [facility], only mild/high penetration of thin liquids noted - not considered disordered in normal elderly population.  Recommend continuing current diet - no SLP f/u recommended.  SLP Visit Diagnosis: Dysphagia, unspecified (R13.10)    Aspiration Risk  No limitations    Diet Recommendation     Medication Administration: Whole meds with liquid    Other  Recommendations Oral Care Recommendations: Oral care BID   Follow up Recommendations None      Frequency and Duration             Prognosis        Swallow Study   General HPI: 83 y.o.malewith medical history of hypertension, CKD stage IV, diabetes mellitus presenting with one to two-day history of increasing pain and erythema of the right hand.  Dx cellulitis RUE, intermittent dyspnea and rhonchi. April 2017 CT cervical spine - mild degenerative change along the cervical spine. Type of Study: Bedside Swallow Evaluation Previous Swallow Assessment: 10/22/15 [place]:  A modified barium swallow study was completed utilizing thin liquid, pure, and solid consistencies. The patient states he is still intermittently coughing up phlegm at times. He states that he is on CPAP at night. His last chest x-ray showed questionable pneumonia in the right lower lung. Oral phase of swallow was characterized by adequate bolus propulsion and manipulation of all consistencies tested. Pharyngeal phase of swallow is characterized by decreased pharyngeal constriction. With thin liquids the patient did have some trace penetration above the vocal cords that cleared. This occurred only with multiple sips. When patient took small single sips no penetration or aspiration occurred. With pure there was pooling to the vallecula, but no penetration or aspiration occurred. There was a very mild amount of residue in the piriform sinuses that cleared with a second swallow. With cracker there was pooling to the vallecula and no residue was noted. The esophagus was slow to clear especially with solids and pure. The patient swallowed a 13 mm barium pill that required liquid wash and pure to clear into the stomach. Recommendations include: 1. One sip at a time. No multiple sips. 2. Sit up  30 minutes after meals. 3. Head of bed above 30 at all times. 4. Reduce the amount of acidic foods in diet. 5. Reflux precautions. Diet Prior to this Study: Regular;Thin liquids Temperature Spikes Noted: No Respiratory Status: Room air History of Recent Intubation:  No Behavior/Cognition: Alert;Cooperative;Pleasant mood Oral Cavity Assessment: Within Functional Limits Oral Care Completed by SLP: No Oral Cavity - Dentition: Adequate natural dentition Vision: Functional for self-feeding Self-Feeding Abilities: Able to feed self Patient Positioning: Upright in bed Baseline Vocal Quality: Normal Volitional Cough: Strong Volitional Swallow: Able to elicit    Oral/Motor/Sensory Function Overall Oral Motor/Sensory Function: Within functional limits   Ice Chips Ice chips: Within functional limits Presentation: Spoon   Thin Liquid Thin Liquid: Within functional limits Presentation: Cup    Nectar Thick Nectar Thick Liquid: Not tested   Honey Thick Honey Thick Liquid: Not tested   Puree Puree: Within functional limits   Solid   GO   Solid: Within functional limits        Clayton [name] Clayton [name] 08/08/2016,2:50 PM

## 2016-08-08 NOTE — Progress Notes (Signed)
VASCULAR LAB PRELIMINARY  PRELIMINARY  PRELIMINARY  PRELIMINARY  Bilateral lower extremity venous duplex completed.    Preliminary report:  Bilateral:  No evidence of DVT, superficial thrombosis, or Baker's Cyst.   Nadean Montanaro, RVS 08/08/2016, 11:22 AM

## 2016-08-09 ENCOUNTER — Encounter (HOSPITAL_COMMUNITY): Payer: Self-pay

## 2016-08-09 LAB — BASIC METABOLIC PANEL
Anion gap: 7 (ref 5–15)
BUN: 44 mg/dL — AB (ref 6–20)
CHLORIDE: 107 mmol/L (ref 101–111)
CO2: 25 mmol/L (ref 22–32)
CREATININE: 2.22 mg/dL — AB (ref 0.61–1.24)
Calcium: 8.9 mg/dL (ref 8.9–10.3)
GFR calc Af Amer: 29 mL/min — ABNORMAL LOW (ref >60)
GFR calc non Af Amer: 25 mL/min — ABNORMAL LOW (ref >60)
GLUCOSE: 186 mg/dL — AB (ref 65–99)
POTASSIUM: 3.8 mmol/L (ref 3.5–5.1)
SODIUM: 139 mmol/L (ref 135–145)

## 2016-08-09 LAB — GLUCOSE, CAPILLARY
GLUCOSE-CAPILLARY: 168 mg/dL — AB (ref 65–99)
Glucose-Capillary: 202 mg/dL — ABNORMAL HIGH (ref 65–99)

## 2016-08-09 LAB — CBC
HCT: 31.5 % — ABNORMAL LOW (ref 39.0–52.0)
HEMOGLOBIN: 10.2 g/dL — AB (ref 13.0–17.0)
MCH: 30.5 pg (ref 26.0–34.0)
MCHC: 32.4 g/dL (ref 30.0–36.0)
MCV: 94.3 fL (ref 78.0–100.0)
Platelets: 254 10*3/uL (ref 150–400)
RBC: 3.34 MIL/uL — ABNORMAL LOW (ref 4.22–5.81)
RDW: 13.3 % (ref 11.5–15.5)
WBC: 14.7 10*3/uL — ABNORMAL HIGH (ref 4.0–10.5)

## 2016-08-09 MED ORDER — INSULIN ASPART 100 UNIT/ML ~~LOC~~ SOLN: 3.0000 [IU] | Freq: Three times a day (TID) | SUBCUTANEOUS | Status: DC

## 2016-08-09 MED ORDER — DOXYCYCLINE HYCLATE 100 MG PO CAPS: 100.0000 mg | ORAL_CAPSULE | Freq: Two times a day (BID) | ORAL | 0 refills | Status: DC

## 2016-08-09 MED ORDER — SACCHAROMYCES BOULARDII 250 MG PO CAPS: 250.0000 mg | ORAL_CAPSULE | Freq: Two times a day (BID) | ORAL | 0 refills | Status: DC

## 2016-08-09 MED ORDER — TRAMADOL HCL 50 MG PO TABS: 50.0000 mg | ORAL_TABLET | Freq: Two times a day (BID) | ORAL | 0 refills | Status: DC | PRN

## 2016-08-09 NOTE — Progress Notes (Signed)
Inpatient Diabetes Program Recommendations  AACE/ADA: New Consensus Statement on Inpatient Glycemic Control (2015)  Target Ranges:  Prepandial:   less than 140 mg/dL      Peak postprandial:   less than 180 mg/dL (1-2 hours)      Critically ill patients:  140 - 180 mg/dL   Lab Results  Component Value Date   GLUCAP 168 (H) 08/09/2016   HGBA1C 7.0 (H) 07/11/2016    Review of Glycemic Control  Post-prandial blood sugars elevated on 2/21. Addition of meal coverage insulin may be beneficial.  Inpatient Diabetes Program Recommendations:    Add Novolog 3 units tidwc for meal coverage insulin.  Continue to follow. Thank you. Lorenda Peck, RD, LDN, CDE Inpatient Diabetes Coordinator [phone]

## 2016-08-09 NOTE — Discharge Summary (Signed)
Physician Discharge Summary  [name]:[id number] DOB: 01/03/1928 DOA: 08/07/2016  PCP: Lujean Amel, MD  Admit date: 08/07/2016 Discharge date: 08/09/2016  Admitted From: Home Disposition: Home   Recommendations for Outpatient Follow-up:  1. Follow up with PCP in the next week with wound recheck 2. Please obtain BMP to monitor CKD and CBC to monitor leukocytosis (improving 30k > 14k)  Home Health: None Equipment/Devices: None new Discharge Condition: Stable CODE STATUS: DNR Diet recommendation: Carbohydrate limited  Brief/Interim Summary: Clayton [name] a 83 y.o.malewith medical history of HTN, CKD stage IV, and IDDM who presented with two-day history of increasing pain and erythema of the right han following a laceration on 2/18. In the emergency department, the patient was afebrile and hemodynamically stable saturating well on room air. WBC was 30.7. BMP showed a serum creatinine 2.28, near baseline. X-ray of the right hand was negative for any acute process. Blood cultures were obtained, the patient was given a dose of clindamycin. Hand surgery, Dr. Grandville Silos from [facility] orthopedics was consulted and recommended I&D as well as allowing wound to heal by secondary intention. I&D was performed by the EDP and the patient was admitted for further management. Cellulitis clinically improved with lab improvement in leukocytosis. MRI was performed and showed no evidence of dep infection, tendonitis, myositis, or osteomyelitis. He has been ambulating with a walker instead of cane due to pain in right hand, but has otherwise maintained his normal functional status  Discharge Diagnoses:  Principal Problem:   Cellulitis of right upper extremity Active Problems:   Smoking history   BPH (benign prostatic hyperplasia)   Cellulitis   Type 2 diabetes mellitus with hyperglycemia, with long-term current use of insulin (HCC)   Chronic kidney disease (CKD), stage IV (severe)  (HCC)  Cellulitis and abscess of the right hand: Evaluated by hand surgery, Dr. Grandville Silos, in the ED who recommended I&D, performed by EDP 2/20, and to let heal by secondary intention. Wound stable, no lymphadenopathy. MRI showed cellulitis without deep abscess, muscle, tendon or bone involvement.  - Improved on vancomycin with improvement in leukocytosis, discharged on doxycycline after discussion with infectious disease. No renal adjustment required.  - Bactroban and dressing change BID recommended with further wound care instructions provided and supplies given to take home. - Follow up within 1 week for wound recheck.  Chronic bronchitis: Evidenced by CXR. No respiratory distress.  - Continued symbicort, bronchodilators.  IDT2DM: Good control boding well for recovery with most recent HbA1c 7.0% and denies hypoglycemic symptoms or readings.  - Restart home medications at discharge.  CKD stage IV: Baseline creatinine 2.3-2.6 - Presenting creatinine 2.28, discharge Cr 2.22. Vancomycin was renally dosed.   Hyperlipidemia - Continue Zocor  Lower extremity edema: Stable. Venous duplex negative for DVT - Continue home lasix.  Discharge Instructions Discharge Instructions    Discharge instructions    Complete by:  As directed    You were admitted with abscess and cellulitis of the right hand and wound of the right arm. The condition is improving and you are stable for discharge.  - Take doxycycline twice daily for the next 7 days or until you follow up with your primary care provider. Please call today to schedule an appointment next week.  - You can take tramadol as needed for pain as well as tylenol.  - Change the bandage every day and cover the wounds with the ointment provided to you. Do not submerge the wound and keep it dry for the next 48  hours.  - If your symptoms worsen or you develop fever, please seek medical attention right away.     Allergies as of 08/09/2016       Reactions   Nitrous Oxide Other (See Comments)   Reaction:  Unknown    Penicillins Other (See Comments)   Reaction:  Unknown Has patient had a PCN reaction causing immediate rash, facial/tongue/throat swelling, SOB or lightheadedness with hypotension: Unsure Has patient had a PCN reaction causing severe rash involving mucus membranes or skin necrosis: Unsure Has patient had a PCN reaction that required hospitalization Unsure  Has patient had a PCN reaction occurring within the last 10 years: No If all of the above answers are "NO", then may proceed with Cephalosporin use.   Tape Other (See Comments)   Reaction:  Tears pts skin    Diltiazem Rash      Medication List    TAKE these medications   albuterol 108 (90 Base) MCG/ACT inhaler Commonly known as:  PROVENTIL HFA;VENTOLIN HFA Inhale 1-2 puffs into the lungs every 6 (six) hours as needed for wheezing or shortness of breath.   budesonide-formoterol 160-4.5 MCG/ACT inhaler Commonly known as:  SYMBICORT Inhale 2 puffs into the lungs 2 (two) times daily.   cholecalciferol 1000 units tablet Commonly known as:  VITAMIN D Take 2,000 Units by mouth daily.   doxycycline 100 MG capsule Commonly known as:  VIBRAMYCIN Take 1 capsule (100 mg total) by mouth 2 (two) times daily.   famotidine 20 MG tablet Commonly known as:  PEPCID Take 20 mg by mouth at bedtime.   ferrous sulfate 325 (65 FE) MG tablet Take 325 mg by mouth daily with breakfast.   finasteride 5 MG tablet Commonly known as:  PROSCAR Take 5 mg by mouth at bedtime.   furosemide 20 MG tablet Commonly known as:  LASIX Take 20 mg by mouth See admin instructions. Pt takes on Sunday, Tuesday, Thursday, and Saturday.   insulin aspart 100 UNIT/ML injection Commonly known as:  novoLOG Inject 15-17 Units into the skin 3 (three) times daily before meals. Pt uses 15 units before breakfast, 17 units before lunch, and 17 units before dinner.   insulin glargine 100 UNIT/ML  injection Commonly known as:  LANTUS Inject 20 Units into the skin 2 (two) times daily.   montelukast 10 MG tablet Commonly known as:  SINGULAIR Take 10 mg by mouth at bedtime.   multivitamin with minerals Tabs tablet Take 1 tablet by mouth daily.   omeprazole 20 MG capsule Commonly known as:  PRILOSEC Take 20 mg by mouth 2 (two) times daily before a meal.   psyllium 0.52 g capsule Commonly known as:  REGULOID Take 0.52 g by mouth daily.   saccharomyces boulardii 250 MG capsule Commonly known as:  FLORASTOR Take 1 capsule (250 mg total) by mouth 2 (two) times daily.   simvastatin 20 MG tablet Commonly known as:  ZOCOR Take 20 mg by mouth at bedtime.   TOVIAZ 8 MG Tb24 tablet Generic drug:  fesoterodine Take 8 mg by mouth at bedtime.   traMADol 50 MG tablet Commonly known as:  ULTRAM Take 1 tablet (50 mg total) by mouth every 12 (twelve) hours as needed (breakthrough pain).      Follow-up Information    KOIRALA,DIBAS, MD. Schedule an appointment as soon as possible for a visit in 1 week(s).   Specialty:  Family Medicine Contact information: [place] [place] [address] [phone]  Allergies  Allergen Reactions  . Nitrous Oxide Other (See Comments)    Reaction:  Unknown   . Penicillins Other (See Comments)    Reaction:  Unknown Has patient had a PCN reaction causing immediate rash, facial/tongue/throat swelling, SOB or lightheadedness with hypotension: Unsure Has patient had a PCN reaction causing severe rash involving mucus membranes or skin necrosis: Unsure Has patient had a PCN reaction that required hospitalization Unsure  Has patient had a PCN reaction occurring within the last 10 years: No If all of the above answers are "NO", then may proceed with Cephalosporin use.  . Tape Other (See Comments)    Reaction:  Tears pts skin   . Diltiazem Rash    Consultations:  Dr. Grandville Silos, hand surgery  Dr. Talbot Grumbling, ID,  by phone only  Procedures/Studies: Dg Chest 2 View  Result Date: 08/07/2016 CLINICAL DATA:  Dyspnea and rhonchi.  Shortness of breath. EXAM: CHEST  2 VIEW COMPARISON:  CT of the chest 12/08/2015 FINDINGS: Cardiomediastinal silhouette is normal. Mediastinal contours appear intact. There is no evidence of focal airspace consolidation, pleural effusion or pneumothorax. Bilateral peribronchial thickening with lower lobe predominance with mild bronchiectasis. Osseous structures are without acute abnormality. Soft tissues are grossly normal. IMPRESSION: Lower lobe bronchiectasis with mild peribronchial thickening, thought to represent chronic bronchitis. No evidence of lobar consolidation. Electronically Signed   By: Fidela Salisbury M.D.   On: 08/07/2016 20:06   Mr Hand Right Wo Contrast  Result Date: 08/09/2016 CLINICAL DATA:  Right hand redness and swelling for 3 days since a trip and fall 08/05/2016 resulting in a laceration of the right hand. EXAM: MRI OF THE RIGHT HAND WITHOUT CONTRAST TECHNIQUE: Multiplanar, multisequence MR imaging of the right hand was performed. No intravenous contrast was administered. COMPARISON:  Plain films right hand 08/07/2016. FINDINGS: Bones/Joint/Cartilage No bone marrow signal abnormality to suggest osteomyelitis is identified. Scattered degenerative change is most notable about the radiocarpal and first CMC joints. Remote healed fracture of the distal radius is noted. Ligaments Intact. Muscles and Tendons Intact. Soft tissues Bandaging is present about the dorsum of the hand between the fourth and fifth MCP joints. There is diffuse subcutaneous edema about the hand without focal fluid collection identified. IMPRESSION: Diffuse subcutaneous edema about the hand compatible with cellulitis. Negative for abscess, septic joint or osteomyelitis. Remote healed distal radius fracture. Radiocarpal and first [place] osteoarthritis. Electronically Signed   By: Inge Rise M.D.   On:  08/09/2016 09:59   Dg Hand Complete Right  Result Date: 08/07/2016 CLINICAL DATA:  Recent fall and hit the posterior right hand with laceration. Patient now has increased pain and redness. EXAM: RIGHT HAND - COMPLETE 3+ VIEW COMPARISON:  09/25/2015 FINDINGS: Stable cortical thickening and deformity of the distal radius compatible with an old fracture. Stable narrowing at the radiocarpal joint. Chronic osteoarthritic changes at the first carpometacarpal joint. Atherosclerotic calcifications along the volar aspect of the wrist. There is no evidence for an acute fracture or dislocation. There appears to be soft tissue swelling along the dorsum of the hand and wrist. IMPRESSION: No acute bone abnormality to the right wrist. Chronic changes as described. Electronically Signed   By: Markus Daft M.D.   On: 08/07/2016 10:29   Subjective: Pt ambulating in hallways with walker due to inability to hold cane in right hand, feels steady. Eating well without fever, N/V/D. No dyspnea. Pain is stable in the right hand.   Discharge Exam: Vitals:   08/08/16 2122 08/09/16 9323  BP: (!) 136/59 120/66  Pulse: 88 81  Resp: 18 16  Temp: 98.4 F (36.9 C) 98.3 F (36.8 C)   General: Pt is alert, awake, not in acute distress Cardiovascular: RRR, S1/S2 +, no rubs, no gallops Respiratory: CTA bilaterally, no wheezing, no rhonchi Abdominal: Soft, NT, ND, bowel sounds + Extremities: Right hand with tenderness to palpation over area near wound, intact extension and flexion in 4th and 5th digits. Radial pulse 2+, cap refill brisk throughout, sensation intact to light touch.  Skin: Dorsal right hand with swelling extending into 4th and 5th proximal digits with erythema extending just past the wrist receded from yesterday's exam and picture from admission. Linear laceration with dry wound bed.  The results of significant diagnostics from this hospitalization (including imaging, microbiology, ancillary and laboratory) are  listed below for reference.    Labs: Basic Metabolic Panel:  Recent Labs Lab 08/07/16 1115 08/08/16 0559 08/09/16 0554  NA 138 139 139  K 4.4 3.9 3.8  CL 104 107 107  CO2 '25 26 25  '$ GLUCOSE 177* 133* 186*  BUN 41* 44* 44*  CREATININE 2.28* 2.41* 2.22*  CALCIUM 9.4 8.7* 8.9   CBC:  Recent Labs Lab 08/07/16 1115 08/08/16 0559 08/09/16 0554  WBC 30.7* 23.7* 14.7*  NEUTROABS 27.0*  --   --   HGB 12.5* 10.6* 10.2*  HCT 38.3* 31.5* 31.5*  MCV 97.0 94.3 94.3  PLT 277 253 254   CBG:  Recent Labs Lab 08/08/16 1124 08/08/16 1615 08/08/16 2145 08/09/16 0730 08/09/16 1148  GLUCAP 216* 223* 218* 168* 202*   Urinalysis    Component Value Date/Time   COLORURINE YELLOW 07/19/2015 0829   APPEARANCEUR CLEAR 07/19/2015 0829   LABSPEC 1.015 07/19/2015 0829   PHURINE 6.0 07/19/2015 0829   GLUCOSEU NEGATIVE 07/19/2015 0829   HGBUR NEGATIVE 07/19/2015 0829   BILIRUBINUR NEGATIVE 07/19/2015 0829   KETONESUR NEGATIVE 07/19/2015 0829   PROTEINUR 100 (A) 06/20/2012 0603   UROBILINOGEN 0.2 07/19/2015 0829   NITRITE NEGATIVE 07/19/2015 0829   LEUKOCYTESUR NEGATIVE 07/19/2015 0829    Microbiology Recent Results (from the past 240 hour(s))  Culture, blood (routine x 2)     Status: None (Preliminary result)   Collection Time: 08/07/16 11:15 AM  Result Value Ref Range Status   Specimen Description BLOOD LEFT ANTECUBITAL  Final   Special Requests BOTTLES DRAWN AEROBIC AND ANAEROBIC 5CC  Final   Culture   Final    NO GROWTH 2 DAYS Performed at [facility], [place] [address], [place], [place] [address]    Report Status PENDING  Incomplete  Culture, blood (routine x 2)     Status: None (Preliminary result)   Collection Time: 08/07/16 11:19 AM  Result Value Ref Range Status   Specimen Description BLOOD LEFT WRIST  Final   Special Requests BOTTLES DRAWN AEROBIC ONLY 5ML  Final   Culture   Final    NO GROWTH 2 DAYS Performed at [facility], [address],  [place], [place] [address]    Report Status PENDING  Incomplete  MRSA PCR Screening     Status: None   Collection Time: 08/07/16 10:08 PM  Result Value Ref Range Status   MRSA by PCR NEGATIVE NEGATIVE Final    Comment:        The GeneXpert MRSA Assay (FDA approved for NASAL specimens only), is one component of a comprehensive MRSA colonization surveillance program. It is not intended to diagnose MRSA infection nor to  guide or monitor treatment for MRSA infections.     Time coordinating discharge: Approximately 40 minutes  Vance Gather, MD  [facility] 08/09/2016, 4:41 PM Pager [phone]

## 2016-08-10 ENCOUNTER — Other Ambulatory Visit: Payer: Self-pay | Admitting: Endocrinology

## 2016-08-11 ENCOUNTER — Encounter (HOSPITAL_COMMUNITY): Payer: Self-pay | Admitting: Emergency Medicine

## 2016-08-11 ENCOUNTER — Emergency Department (HOSPITAL_COMMUNITY): Payer: Medicare PPO

## 2016-08-11 ENCOUNTER — Inpatient Hospital Stay (HOSPITAL_COMMUNITY)
Admission: EM | Admit: 2016-08-11 | Discharge: 2016-08-16 | DRG: 571 | Disposition: A | Discharge status: Home Health | Payer: Medicare PPO | Attending: Internal Medicine | Admitting: Internal Medicine

## 2016-08-11 DIAGNOSIS — Z8249 Family history of ischemic heart disease and other diseases of the circulatory system: Secondary | ICD-10-CM | POA: Diagnosis not present

## 2016-08-11 DIAGNOSIS — G4730 Sleep apnea, unspecified: Secondary | ICD-10-CM | POA: Diagnosis not present

## 2016-08-11 DIAGNOSIS — G4733 Obstructive sleep apnea (adult) (pediatric): Secondary | ICD-10-CM | POA: Diagnosis present

## 2016-08-11 DIAGNOSIS — E1165 Type 2 diabetes mellitus with hyperglycemia: Secondary | ICD-10-CM | POA: Diagnosis present

## 2016-08-11 DIAGNOSIS — F5104 Psychophysiologic insomnia: Secondary | ICD-10-CM | POA: Diagnosis present

## 2016-08-11 DIAGNOSIS — E1122 Type 2 diabetes mellitus with diabetic chronic kidney disease: Secondary | ICD-10-CM | POA: Diagnosis not present

## 2016-08-11 DIAGNOSIS — W010XXA Fall on same level from slipping, tripping and stumbling without subsequent striking against object, initial encounter: Secondary | ICD-10-CM | POA: Diagnosis present

## 2016-08-11 DIAGNOSIS — L02511 Cutaneous abscess of right hand: Secondary | ICD-10-CM | POA: Diagnosis present

## 2016-08-11 DIAGNOSIS — Z87891 Personal history of nicotine dependence: Secondary | ICD-10-CM | POA: Diagnosis not present

## 2016-08-11 DIAGNOSIS — N183 Chronic kidney disease, stage 3 (moderate): Secondary | ICD-10-CM

## 2016-08-11 DIAGNOSIS — E1142 Type 2 diabetes mellitus with diabetic polyneuropathy: Secondary | ICD-10-CM | POA: Diagnosis not present

## 2016-08-11 DIAGNOSIS — Z888 Allergy status to other drugs, medicaments and biological substances status: Secondary | ICD-10-CM

## 2016-08-11 DIAGNOSIS — R600 Localized edema: Secondary | ICD-10-CM | POA: Diagnosis present

## 2016-08-11 DIAGNOSIS — Z7951 Long term (current) use of inhaled steroids: Secondary | ICD-10-CM | POA: Diagnosis not present

## 2016-08-11 DIAGNOSIS — E7800 Pure hypercholesterolemia, unspecified: Secondary | ICD-10-CM | POA: Diagnosis not present

## 2016-08-11 DIAGNOSIS — I10 Essential (primary) hypertension: Secondary | ICD-10-CM | POA: Diagnosis present

## 2016-08-11 DIAGNOSIS — B9689 Other specified bacterial agents as the cause of diseases classified elsewhere: Secondary | ICD-10-CM | POA: Diagnosis not present

## 2016-08-11 DIAGNOSIS — R05 Cough: Secondary | ICD-10-CM | POA: Diagnosis not present

## 2016-08-11 DIAGNOSIS — Z66 Do not resuscitate: Secondary | ICD-10-CM | POA: Diagnosis present

## 2016-08-11 DIAGNOSIS — Z794 Long term (current) use of insulin: Secondary | ICD-10-CM

## 2016-08-11 DIAGNOSIS — M7989 Other specified soft tissue disorders: Secondary | ICD-10-CM | POA: Diagnosis not present

## 2016-08-11 DIAGNOSIS — Z833 Family history of diabetes mellitus: Secondary | ICD-10-CM | POA: Diagnosis not present

## 2016-08-11 DIAGNOSIS — L03113 Cellulitis of right upper limb: Secondary | ICD-10-CM | POA: Diagnosis not present

## 2016-08-11 DIAGNOSIS — N184 Chronic kidney disease, stage 4 (severe): Secondary | ICD-10-CM | POA: Diagnosis present

## 2016-08-11 DIAGNOSIS — E785 Hyperlipidemia, unspecified: Secondary | ICD-10-CM | POA: Diagnosis present

## 2016-08-11 DIAGNOSIS — E1121 Type 2 diabetes mellitus with diabetic nephropathy: Secondary | ICD-10-CM

## 2016-08-11 DIAGNOSIS — I129 Hypertensive chronic kidney disease with stage 1 through stage 4 chronic kidney disease, or unspecified chronic kidney disease: Secondary | ICD-10-CM | POA: Diagnosis present

## 2016-08-11 DIAGNOSIS — Z9181 History of falling: Secondary | ICD-10-CM | POA: Diagnosis not present

## 2016-08-11 DIAGNOSIS — E119 Type 2 diabetes mellitus without complications: Secondary | ICD-10-CM

## 2016-08-11 DIAGNOSIS — M810 Age-related osteoporosis without current pathological fracture: Secondary | ICD-10-CM | POA: Diagnosis present

## 2016-08-11 DIAGNOSIS — N400 Enlarged prostate without lower urinary tract symptoms: Secondary | ICD-10-CM | POA: Diagnosis present

## 2016-08-11 DIAGNOSIS — Z91048 Other nonmedicinal substance allergy status: Secondary | ICD-10-CM | POA: Diagnosis not present

## 2016-08-11 DIAGNOSIS — Z79899 Other long term (current) drug therapy: Secondary | ICD-10-CM | POA: Diagnosis not present

## 2016-08-11 DIAGNOSIS — Z9889 Other specified postprocedural states: Secondary | ICD-10-CM | POA: Diagnosis not present

## 2016-08-11 DIAGNOSIS — M79641 Pain in right hand: Secondary | ICD-10-CM | POA: Diagnosis present

## 2016-08-11 DIAGNOSIS — J42 Unspecified chronic bronchitis: Secondary | ICD-10-CM | POA: Diagnosis present

## 2016-08-11 DIAGNOSIS — D631 Anemia in chronic kidney disease: Secondary | ICD-10-CM | POA: Diagnosis present

## 2016-08-11 DIAGNOSIS — Z880 Allergy status to penicillin: Secondary | ICD-10-CM

## 2016-08-11 DIAGNOSIS — C3490 Malignant neoplasm of unspecified part of unspecified bronchus or lung: Secondary | ICD-10-CM | POA: Diagnosis not present

## 2016-08-11 DIAGNOSIS — S50811A Abrasion of right forearm, initial encounter: Secondary | ICD-10-CM | POA: Diagnosis not present

## 2016-08-11 LAB — COMPREHENSIVE METABOLIC PANEL
ALBUMIN: 3.5 g/dL (ref 3.5–5.0)
ALK PHOS: 75 U/L (ref 38–126)
ALT: 23 U/L (ref 17–63)
AST: 22 U/L (ref 15–41)
Anion gap: 9 (ref 5–15)
BILIRUBIN TOTAL: 0.5 mg/dL (ref 0.3–1.2)
BUN: 45 mg/dL — AB (ref 6–20)
CALCIUM: 9.2 mg/dL (ref 8.9–10.3)
CO2: 24 mmol/L (ref 22–32)
Chloride: 107 mmol/L (ref 101–111)
Creatinine, Ser: 2.22 mg/dL — ABNORMAL HIGH (ref 0.61–1.24)
GFR calc Af Amer: 29 mL/min — ABNORMAL LOW (ref >60)
GFR calc non Af Amer: 25 mL/min — ABNORMAL LOW (ref >60)
GLUCOSE: 116 mg/dL — AB (ref 65–99)
POTASSIUM: 3.7 mmol/L (ref 3.5–5.1)
Sodium: 140 mmol/L (ref 135–145)
TOTAL PROTEIN: 7.4 g/dL (ref 6.5–8.1)

## 2016-08-11 LAB — CBC WITH DIFFERENTIAL/PLATELET
BASOS ABS: 0.1 10*3/uL (ref 0.0–0.1)
Basophils Relative: 1 %
EOS PCT: 3 %
Eosinophils Absolute: 0.4 10*3/uL (ref 0.0–0.7)
HCT: 36.2 % — ABNORMAL LOW (ref 39.0–52.0)
Hemoglobin: 11.7 g/dL — ABNORMAL LOW (ref 13.0–17.0)
LYMPHS PCT: 12 %
Lymphs Abs: 1.4 10*3/uL (ref 0.7–4.0)
MCH: 31.2 pg (ref 26.0–34.0)
MCHC: 32.3 g/dL (ref 30.0–36.0)
MCV: 96.5 fL (ref 78.0–100.0)
MONO ABS: 1.2 10*3/uL — AB (ref 0.1–1.0)
Monocytes Relative: 10 %
Neutro Abs: 9.2 10*3/uL — ABNORMAL HIGH (ref 1.7–7.7)
Neutrophils Relative %: 74 %
PLATELETS: 335 10*3/uL (ref 150–400)
RBC: 3.75 MIL/uL — ABNORMAL LOW (ref 4.22–5.81)
RDW: 13.2 % (ref 11.5–15.5)
WBC: 12.2 10*3/uL — ABNORMAL HIGH (ref 4.0–10.5)

## 2016-08-11 LAB — GLUCOSE, CAPILLARY
GLUCOSE-CAPILLARY: 44 mg/dL — AB (ref 65–99)
Glucose-Capillary: 102 mg/dL — ABNORMAL HIGH (ref 65–99)
Glucose-Capillary: 198 mg/dL — ABNORMAL HIGH (ref 65–99)

## 2016-08-11 LAB — I-STAT CG4 LACTIC ACID, ED: Lactic Acid, Venous: 1.34 mmol/L (ref 0.5–1.9)

## 2016-08-11 MED ORDER — ONDANSETRON HCL 4 MG PO TABS
4.0000 mg | ORAL_TABLET | Freq: Four times a day (QID) | ORAL | Status: DC | PRN
Start: 2016-08-11 — End: 2016-08-16

## 2016-08-11 MED ORDER — ENOXAPARIN SODIUM 30 MG/0.3ML ~~LOC~~ SOLN
30.0000 mg | SUBCUTANEOUS | Status: DC
  Administered 2016-08-11 – 2016-08-12 (×2): 30 mg via SUBCUTANEOUS
  Filled 2016-08-11 (×2): qty 0.3

## 2016-08-11 MED ORDER — SIMVASTATIN 10 MG PO TABS
20.0000 mg | ORAL_TABLET | Freq: Every day | ORAL | Status: DC
  Administered 2016-08-11 – 2016-08-15 (×5): 20 mg via ORAL
  Filled 2016-08-11 (×6): qty 2

## 2016-08-11 MED ORDER — TRAMADOL HCL 50 MG PO TABS
50.0000 mg | ORAL_TABLET | Freq: Two times a day (BID) | ORAL | Status: DC | PRN
  Administered 2016-08-13: 50 mg via ORAL
  Filled 2016-08-11: qty 1

## 2016-08-11 MED ORDER — FERROUS SULFATE 325 (65 FE) MG PO TABS
325.0000 mg | ORAL_TABLET | Freq: Every day | ORAL | Status: DC
  Administered 2016-08-12 – 2016-08-16 (×5): 325 mg via ORAL
  Filled 2016-08-11 (×5): qty 1

## 2016-08-11 MED ORDER — PSYLLIUM 95 % PO PACK
1.0000 | PACK | Freq: Every day | ORAL | Status: DC
  Administered 2016-08-12 – 2016-08-16 (×5): 1 via ORAL
  Filled 2016-08-11 (×5): qty 1

## 2016-08-11 MED ORDER — FINASTERIDE 5 MG PO TABS
5.0000 mg | ORAL_TABLET | Freq: Every day | ORAL | Status: DC
  Administered 2016-08-11 – 2016-08-15 (×5): 5 mg via ORAL
  Filled 2016-08-11 (×5): qty 1

## 2016-08-11 MED ORDER — ONDANSETRON HCL 4 MG/2ML IJ SOLN: 4.0000 mg | Freq: Four times a day (QID) | INTRAMUSCULAR | Status: DC | PRN

## 2016-08-11 MED ORDER — VANCOMYCIN HCL 10 G IV SOLR
1500.0000 mg | Freq: Once | INTRAVENOUS | Status: AC
  Administered 2016-08-11: 1500 mg via INTRAVENOUS
  Filled 2016-08-11: qty 1500

## 2016-08-11 MED ORDER — POLYETHYLENE GLYCOL 3350 17 G PO PACK
17.0000 g | PACK | Freq: Every day | ORAL | Status: DC | PRN
  Filled 2016-08-11: qty 1

## 2016-08-11 MED ORDER — VANCOMYCIN HCL 10 G IV SOLR
1500.0000 mg | INTRAVENOUS | Status: DC
  Administered 2016-08-13 – 2016-08-15 (×2): 1500 mg via INTRAVENOUS
  Filled 2016-08-11 (×2): qty 1500

## 2016-08-11 MED ORDER — OXYCODONE HCL 5 MG PO TABS
5.0000 mg | ORAL_TABLET | ORAL | Status: DC | PRN
  Administered 2016-08-11 – 2016-08-15 (×7): 5 mg via ORAL
  Filled 2016-08-11 (×7): qty 1

## 2016-08-11 MED ORDER — FAMOTIDINE 20 MG PO TABS
20.0000 mg | ORAL_TABLET | Freq: Every day | ORAL | Status: DC
  Administered 2016-08-11 – 2016-08-15 (×5): 20 mg via ORAL
  Filled 2016-08-11 (×5): qty 1

## 2016-08-11 MED ORDER — ALBUTEROL SULFATE (2.5 MG/3ML) 0.083% IN NEBU
2.5000 mg | INHALATION_SOLUTION | Freq: Four times a day (QID) | RESPIRATORY_TRACT | Status: DC | PRN
  Administered 2016-08-11 – 2016-08-13 (×4): 2.5 mg via RESPIRATORY_TRACT
  Filled 2016-08-11 (×5): qty 3

## 2016-08-11 MED ORDER — FUROSEMIDE 20 MG PO TABS
20.0000 mg | ORAL_TABLET | ORAL | Status: DC
  Administered 2016-08-12 – 2016-08-14 (×2): 20 mg via ORAL
  Filled 2016-08-11 (×2): qty 1

## 2016-08-11 MED ORDER — INSULIN GLARGINE 100 UNIT/ML ~~LOC~~ SOLN
20.0000 [IU] | Freq: Two times a day (BID) | SUBCUTANEOUS | Status: DC
  Administered 2016-08-11 – 2016-08-16 (×10): 20 [IU] via SUBCUTANEOUS
  Filled 2016-08-11 (×11): qty 0.2

## 2016-08-11 MED ORDER — ADULT MULTIVITAMIN W/MINERALS CH
1.0000 | ORAL_TABLET | Freq: Every day | ORAL | Status: DC
Start: 2016-08-12 — End: 2016-08-16
  Administered 2016-08-12 – 2016-08-16 (×5): 1 via ORAL
  Filled 2016-08-11 (×5): qty 1

## 2016-08-11 MED ORDER — MELATONIN 3 MG PO TABS: ORAL_TABLET | Freq: Every day | ORAL | Status: DC

## 2016-08-11 MED ORDER — MONTELUKAST SODIUM 10 MG PO TABS
10.0000 mg | ORAL_TABLET | Freq: Every day | ORAL | Status: DC
  Administered 2016-08-11 – 2016-08-15 (×5): 10 mg via ORAL
  Filled 2016-08-11 (×5): qty 1

## 2016-08-11 MED ORDER — SODIUM CHLORIDE 0.9 % IV SOLN
INTRAVENOUS | Status: DC
  Administered 2016-08-11 – 2016-08-15 (×8): via INTRAVENOUS

## 2016-08-11 MED ORDER — SACCHAROMYCES BOULARDII 250 MG PO CAPS
250.0000 mg | ORAL_CAPSULE | Freq: Two times a day (BID) | ORAL | Status: DC
  Administered 2016-08-11 – 2016-08-16 (×10): 250 mg via ORAL
  Filled 2016-08-11 (×10): qty 1

## 2016-08-11 MED ORDER — PANTOPRAZOLE SODIUM 40 MG PO TBEC
40.0000 mg | DELAYED_RELEASE_TABLET | Freq: Every day | ORAL | Status: DC
  Administered 2016-08-12 – 2016-08-16 (×5): 40 mg via ORAL
  Filled 2016-08-11 (×5): qty 1

## 2016-08-11 MED ORDER — MOMETASONE FURO-FORMOTEROL FUM 200-5 MCG/ACT IN AERO
2.0000 | INHALATION_SPRAY | Freq: Two times a day (BID) | RESPIRATORY_TRACT | Status: DC
  Administered 2016-08-11 – 2016-08-16 (×9): 2 via RESPIRATORY_TRACT
  Filled 2016-08-11: qty 8.8

## 2016-08-11 MED ORDER — FESOTERODINE FUMARATE ER 8 MG PO TB24
8.0000 mg | ORAL_TABLET | Freq: Every day | ORAL | Status: DC
  Administered 2016-08-11 – 2016-08-15 (×5): 8 mg via ORAL
  Filled 2016-08-11 (×5): qty 1

## 2016-08-11 NOTE — H&P (Signed)
History and Physical  Clayton [name] XBD:[id number] DOB: 27-Apr-1928 DOA: 08/11/2016  Referring physician: Corrie Mckusick, ER PA  PCP: Lujean Amel, MD  Outpatient Specialists:  Patient coming from: Home & is able to ambulate using a cane  Chief Complaint: Redness of the hand   HPI: Clayton [name] is a 83 y.o. male with medical history significant of diabetes mellitus with secondary peripheral neuropathy and stage III chronic kidney disease who was just discharged from the hospitalist service on 2/22 after a 2 day hospital stay for right hand cellulitis following a right hand wound that required debridement. Patient had been on IV vancomycin during his hospitalization and his white count had improved and he was discharged on by mouth doxycycline. According to family, the wound continued to improve, but starting on 2/23, the surrounding redness continued to worsen along with swelling and the patient was brought back into the emergency room this morning.  ED Course: In the emergency room, patient's white count and lactic acid level was normal. A hand x-ray noted no evidence of osteomyelitis only soft tissue swelling. Was evidence however when compared to pictures of his hand wound that while the wound was improved, felt to be surrounding worsening erythema. Patient was given a dose of IV vancomycin and hospitalists were consulted.  Review of Systems: Patient seen after arrival to floor . Pt complains of right hand pain and swelling along with some mild wheezing. He also has chronic lower extremity neuropathy  Pt denies any headaches, vision changes, dysphagia, chest pain, palpitations, shortness of breath, cough, abdominal pain, hematuria, dysuria, constipation, diarrhea, focal extremity numbness weakness or pain other than described above. .  Review of systems are otherwise negative   Past Medical History:  Diagnosis Date  . Anemia   . Chronic renal insufficiency   . DDD (degenerative disc  disease), lumbar   . Diabetes mellitus   . Hypertension   . Neuromuscular disorder ([place])   . Osteoporosis    Past Surgical History:  Procedure Laterality Date  . EYE SURGERY    . VASECTOMY    . VIDEO BRONCHOSCOPY Bilateral 08/25/2012   Procedure: VIDEO BRONCHOSCOPY WITHOUT FLUORO;  Surgeon: Brand Males, MD;  Location: [place];  Service: Cardiopulmonary;  Laterality: Bilateral;    Social History:  reports that he quit smoking about 43 years ago. His smoking use included Cigarettes. He has a 99.00 pack-year smoking history. He quit smokeless tobacco use about 40 years ago. He reports that he does not drink alcohol or use drugs. Patient lives at home with his wife   Allergies  Allergen Reactions  . Nitrous Oxide Other (See Comments)    Reaction:  Unknown   . Penicillins Other (See Comments)    Reaction:  Unknown Has patient had a PCN reaction causing immediate rash, facial/tongue/throat swelling, SOB or lightheadedness with hypotension: Unsure Has patient had a PCN reaction causing severe rash involving mucus membranes or skin necrosis: Unsure Has patient had a PCN reaction that required hospitalization Unsure  Has patient had a PCN reaction occurring within the last 10 years: No If all of the above answers are "NO", then may proceed with Cephalosporin use.  . Tape Other (See Comments)    Reaction:  Tears pts skin   . Diltiazem Rash    Family History  Problem Relation Age of Onset  . Diabetes Mellitus II    . Hypertension        Prior to Admission medications   Medication Sig Start Date End  Date Taking? Authorizing Provider  albuterol (PROVENTIL HFA;VENTOLIN HFA) 108 (90 Base) MCG/ACT inhaler Inhale 1-2 puffs into the lungs every 6 (six) hours as needed for wheezing or shortness of breath.   Yes Historical Provider, MD  budesonide-formoterol (SYMBICORT) 160-4.5 MCG/ACT inhaler Inhale 2 puffs into the lungs 2 (two) times daily. 07/29/13  Yes Tanda Rockers, MD    cholecalciferol (VITAMIN D) 1000 units tablet Take 2,000 Units by mouth daily.   Yes Historical Provider, MD  doxycycline (VIBRAMYCIN) 100 MG capsule Take 1 capsule (100 mg total) by mouth 2 (two) times daily. 08/09/16  Yes Patrecia Pour, MD  famotidine (PEPCID) 20 MG tablet Take 20 mg by mouth at bedtime.   Yes Historical Provider, MD  ferrous sulfate 325 (65 FE) MG tablet Take 325 mg by mouth daily with breakfast.   Yes Historical Provider, MD  finasteride (PROSCAR) 5 MG tablet Take 5 mg by mouth at bedtime.   Yes Historical Provider, MD  furosemide (LASIX) 20 MG tablet Take 20 mg by mouth See admin instructions. Pt takes on Sunday, Tuesday, Thursday, and Saturday.   Yes Historical Provider, MD  insulin aspart (NOVOLOG) 100 UNIT/ML injection Inject 15-17 Units into the skin 3 (three) times daily before meals. Pt uses 15 units before breakfast, 17 units before lunch, and 17 units before dinner.   Yes Historical Provider, MD  insulin glargine (LANTUS) 100 UNIT/ML injection Inject 20 Units into the skin 2 (two) times daily.   Yes Historical Provider, MD  MELATONIN PO Take 1 tablet by mouth at bedtime.   Yes Historical Provider, MD  montelukast (SINGULAIR) 10 MG tablet Take 10 mg by mouth at bedtime.   Yes Historical Provider, MD  Multiple Vitamin (MULTIVITAMIN WITH MINERALS) TABS tablet Take 1 tablet by mouth daily.   Yes Historical Provider, MD  omeprazole (PRILOSEC) 20 MG capsule Take 20 mg by mouth 2 (two) times daily before a meal.   Yes Historical Provider, MD  psyllium (REGULOID) 0.52 g capsule Take 0.52 g by mouth daily.   Yes Historical Provider, MD  saccharomyces boulardii (FLORASTOR) 250 MG capsule Take 1 capsule (250 mg total) by mouth 2 (two) times daily. 08/09/16  Yes Patrecia Pour, MD  simvastatin (ZOCOR) 20 MG tablet Take 20 mg by mouth at bedtime.   Yes Historical Provider, MD  TOVIAZ 8 MG TB24 tablet Take 8 mg by mouth at bedtime.    Yes Historical Provider, MD  traMADol (ULTRAM) 50 MG  tablet Take 1 tablet (50 mg total) by mouth every 12 (twelve) hours as needed (breakthrough pain). 08/09/16  Yes Patrecia Pour, MD    Physical Exam: BP (!) 147/60 (BP Location: Left Arm)   Pulse 83   Temp 98.1 F (36.7 C) (Oral)   Resp 16   SpO2 100%   General:  Alert and oriented 3, no acute distress  Eyes: Sclera nonicteric, XRT movements are intact  ENT: Normocephalic major medical management range or slightly dry  Neck: Supple, no JVD  Cardiovascular: Regular rate and rhythm, S1-S2  Respiratory: Clear to auscultation bilaterally  Abdomen: Soft, nontender, nondistended, positive bowel sounds  Skin: The patient's right hand, by his third MCP, he has a 1 cm healing wound status post debridement with good granulation tissue. However surrounding this, is significant erythema involving his dorsal aspect of his hand and fourth and fifth fingers with swelling and erythema streaks up to his mid forearm  Musculoskeletal: No clubbing or cyanosis, right upper extremity is warm,  bilateral lower extremities are cool which is baseline. 1+ pitting edema from the knees down bilaterally  Psychiatric: Patient is appropriate, no evidence of psychoses  Neurologic: Chronic peripheral lower extremity neuropathy           Labs on Admission:  Basic Metabolic Panel:  Recent Labs Lab 08/07/16 1115 08/08/16 0559 08/09/16 0554 08/11/16 1348  NA 138 139 139 140  K 4.4 3.9 3.8 3.7  CL 104 107 107 107  CO2 '25 26 25 24  '$ GLUCOSE 177* 133* 186* 116*  BUN 41* 44* 44* 45*  CREATININE 2.28* 2.41* 2.22* 2.22*  CALCIUM 9.4 8.7* 8.9 9.2   Liver Function Tests:  Recent Labs Lab 08/11/16 1348  AST 22  ALT 23  ALKPHOS 75  BILITOT 0.5  PROT 7.4  ALBUMIN 3.5   No results for input(s): LIPASE, AMYLASE in the last 168 hours. No results for input(s): AMMONIA in the last 168 hours. CBC:  Recent Labs Lab 08/07/16 1115 08/08/16 0559 08/09/16 0554 08/11/16 1348  WBC 30.7* 23.7* 14.7* 12.2*  NEUTROABS  27.0*  --   --  9.2*  HGB 12.5* 10.6* 10.2* 11.7*  HCT 38.3* 31.5* 31.5* 36.2*  MCV 97.0 94.3 94.3 96.5  PLT 277 253 254 335   Cardiac Enzymes: No results for input(s): CKTOTAL, CKMB, CKMBINDEX, TROPONINI in the last 168 hours.  BNP (last 3 results) No results for input(s): BNP in the last 8760 hours.  ProBNP (last 3 results) No results for input(s): PROBNP in the last 8760 hours.  CBG:  Recent Labs Lab 08/08/16 1124 08/08/16 1615 08/08/16 2145 08/09/16 0730 08/09/16 1148  GLUCAP 216* 223* 218* 168* 202*    Radiological Exams on Admission: Dg Hand Complete Right  Result Date: 08/11/2016 CLINICAL DATA:  83 year old male with progressive redness and swelling of the right hand despite antibiotic therapy for cellulitis EXAM: RIGHT HAND - COMPLETE 3+ VIEW COMPARISON:  None. FINDINGS: No evidence of acute fracture or malalignment. Abnormal appearance of the distal radius with coarsening of the trabecular pattern and widening of the cortical space. Multifocal degenerative osteoarthritis including the radiocarpal joints, base of the thumb carpometacarpal joint, and the distal interphalangeal joints. Soft tissue swelling is present involving the hand and all digits. Scattered atherosclerotic vascular calcifications present along the radial artery. No radiopaque retained foreign body. IMPRESSION: 1. Diffuse soft tissue swelling affecting the hands and second through fifth digits. 2. Abnormal appearance of the distal radius which may reflect the sequelae of remote healed trauma or Paget's disease. 3. Multifocal degenerative osteoarthritis. Electronically Signed   By: Jacqulynn Cadet M.D.   On: 08/11/2016 14:32    EKG: Not done   Assessment/Plan Present on Admission: . Cellulitis of right hand: Agreed with the wound itself looks better. He did not think that this is osteomyelitis. Suspect that he responded well to IV vancomycin, but perhaps changing him to doxycycline may have been the  issue. Have restarted IV vancomycin and have trace the borders of the erythema on his hand with an ink and to see how he responds by tomorrow. Medication for pain control. . Essential hypertension: Continue home medicines . Sleep apnea . CKD (chronic kidney disease) stage 3, GFR 30-59 ml/min: At baseline Diabetes mellitus type 2 controlled with long-term use of insulin with peripheral neuropathy: Continue home medications. Last A1c at 7 last month  Principal Problem:   Cellulitis of right hand Active Problems:   Essential hypertension   Sleep apnea   Diabetes mellitus ([place])  CKD (chronic kidney disease) stage 3, GFR 30-59 ml/min   DVT prophylaxis: Lovenox   Code Status: DO NOT RESUSCITATE as per family   Family Communication: Daughters and wife at the bedside   Disposition Plan: Patient will be for several days as we treat the cellulitis with IV antibiotics   Consults called: Wound care  Admission status:  This patient will require in the Hospital services beyond 2 midnights, placed him as inpatient status    Annita Brod MD [facility] Pager [phone]  If 7PM-7AM, please contact night-coverage [email or web address] Password [facility]  08/11/2016, 6:15 PM

## 2016-08-11 NOTE — Progress Notes (Signed)
Pharmacy Antibiotic Note  Clayton [name] is a 83 y.o. male admitted on 08/11/2016 with cellulitis.  Pharmacy has been consulted for Vancomycin dosing.  Plan: Vancomycin 1500 mg IV q48h.    Temp (24hrs), Avg:98.1 F (36.7 C), Min:98.1 F (36.7 C), Max:98.1 F (36.7 C)   Recent Labs Lab 08/07/16 1115 08/08/16 0559 08/09/16 0554 08/11/16 1348 08/11/16 1400  WBC 30.7* 23.7* 14.7* 12.2*  --   CREATININE 2.28* 2.41* 2.22* 2.22*  --   LATICACIDVEN  --   --   --   --  1.34    Estimated Creatinine Clearance: 29.2 mL/min (by C-G formula based on SCr of 2.22 mg/dL (H)).    Allergies  Allergen Reactions  . Nitrous Oxide Other (See Comments)    Reaction:  Unknown   . Penicillins Other (See Comments)    Reaction:  Unknown Has patient had a PCN reaction causing immediate rash, facial/tongue/throat swelling, SOB or lightheadedness with hypotension: Unsure Has patient had a PCN reaction causing severe rash involving mucus membranes or skin necrosis: Unsure Has patient had a PCN reaction that required hospitalization Unsure  Has patient had a PCN reaction occurring within the last 10 years: No If all of the above answers are "NO", then may proceed with Cephalosporin use.  . Tape Other (See Comments)    Reaction:  Tears pts skin   . Diltiazem Rash    Thank you for allowing pharmacy to be a part of this patient's care.  Hershal Coria 08/11/2016 3:02 PM

## 2016-08-11 NOTE — ED Provider Notes (Signed)
[place] DEPT Provider Note   CSN: [id number] Arrival date & time: 08/11/16  1302     History   Chief Complaint Chief Complaint  Patient presents with  . Wound Infection    HPI JAWANZA ZAMBITO is a 83 y.o. male.  The history is provided by the patient and medical records. No language interpreter was used.   EARLAND REISH is a 83 y.o. male  with a PMH of DM, HTN, HLD, CKD who presents to the Emergency Department complaining of worsening redness and pain to the right hand. Patient states that he was admitted on 2/20 after he tripped and fell in his backyard. Per chart review, patient was seen on 2/20 where wound was I&D and one dose clinda given in ED. He was then admitted and started on vanc. Upon discharge on 2/22 he was transitioned to doxycycline which he has been taking as directed (night of 22nd, BID on the 23rd, this am). MRI while in the hospital shows no evidence of deep infection, tendonitis, myositis or osteomyelitis. Patient and family at bedside state that wound was healing well, but yesterday started to get red again. This morning, they noticed red streaking down the pinkie side of his hand, prompting them to come to the ER. Family has been doing BID bactroban and dressing changes. Endorses warmth to the area as well. Denies fever/chills.      Past Medical History:  Diagnosis Date  . Anemia   . Chronic renal insufficiency   . DDD (degenerative disc disease), lumbar   . Diabetes mellitus   . Hypertension   . Neuromuscular disorder ([place])   . Osteoporosis     Patient Active Problem List   Diagnosis Date Noted  . Cellulitis and abscess of foot 08/11/2016  . Cellulitis 08/07/2016  . Type 2 diabetes mellitus with hyperglycemia, with long-term current use of insulin ([place]) 08/07/2016  . Cellulitis of right upper extremity 08/07/2016  . Chronic kidney disease (CKD), stage IV (severe) ([place]) 08/07/2016  . Type II or unspecified type diabetes mellitus with renal  manifestations, uncontrolled(250.42) 02/03/2014  . Type II or unspecified type diabetes mellitus without mention of complication, uncontrolled 07/03/2013  . BPH (benign prostatic hyperplasia) 07/03/2013  . Pure hypercholesterolemia 04/06/2013  . Dyspnea 03/04/2013  . Type II or unspecified type diabetes mellitus without mention of complication, not stated as uncontrolled 01/01/2013  . Smoking history 07/24/2012  . Cough variant asthma 07/24/2012  . Solitary pulmonary nodule 06/27/2012  . Hypoxia 06/19/2012  . Diabetes mellitus ([place]) 06/19/2012  . Chronic kidney disease 06/19/2012  . Hard of hearing 06/19/2012  . HYPERTENSION 05/19/2010  . SLEEP APNEA 05/19/2010    Past Surgical History:  Procedure Laterality Date  . EYE SURGERY    . VASECTOMY    . VIDEO BRONCHOSCOPY Bilateral 08/25/2012   Procedure: VIDEO BRONCHOSCOPY WITHOUT FLUORO;  Surgeon: Brand Males, MD;  Location: [place];  Service: Cardiopulmonary;  Laterality: Bilateral;       Home Medications    Prior to Admission medications   Medication Sig Start Date End Date Taking? Authorizing Provider  albuterol (PROVENTIL HFA;VENTOLIN HFA) 108 (90 Base) MCG/ACT inhaler Inhale 1-2 puffs into the lungs every 6 (six) hours as needed for wheezing or shortness of breath.   Yes Historical Provider, MD  budesonide-formoterol (SYMBICORT) 160-4.5 MCG/ACT inhaler Inhale 2 puffs into the lungs 2 (two) times daily. 07/29/13  Yes Tanda Rockers, MD  cholecalciferol (VITAMIN D) 1000 units tablet Take 2,000 Units by mouth daily.  Yes Historical Provider, MD  doxycycline (VIBRAMYCIN) 100 MG capsule Take 1 capsule (100 mg total) by mouth 2 (two) times daily. 08/09/16  Yes Patrecia Pour, MD  famotidine (PEPCID) 20 MG tablet Take 20 mg by mouth at bedtime.   Yes Historical Provider, MD  ferrous sulfate 325 (65 FE) MG tablet Take 325 mg by mouth daily with breakfast.   Yes Historical Provider, MD  finasteride (PROSCAR) 5 MG tablet Take 5 mg  by mouth at bedtime.   Yes Historical Provider, MD  furosemide (LASIX) 20 MG tablet Take 20 mg by mouth See admin instructions. Pt takes on Sunday, Tuesday, Thursday, and Saturday.   Yes Historical Provider, MD  insulin aspart (NOVOLOG) 100 UNIT/ML injection Inject 15-17 Units into the skin 3 (three) times daily before meals. Pt uses 15 units before breakfast, 17 units before lunch, and 17 units before dinner.   Yes Historical Provider, MD  insulin glargine (LANTUS) 100 UNIT/ML injection Inject 20 Units into the skin 2 (two) times daily.   Yes Historical Provider, MD  MELATONIN PO Take 1 tablet by mouth at bedtime.   Yes Historical Provider, MD  montelukast (SINGULAIR) 10 MG tablet Take 10 mg by mouth at bedtime.   Yes Historical Provider, MD  Multiple Vitamin (MULTIVITAMIN WITH MINERALS) TABS tablet Take 1 tablet by mouth daily.   Yes Historical Provider, MD  omeprazole (PRILOSEC) 20 MG capsule Take 20 mg by mouth 2 (two) times daily before a meal.   Yes Historical Provider, MD  psyllium (REGULOID) 0.52 g capsule Take 0.52 g by mouth daily.   Yes Historical Provider, MD  saccharomyces boulardii (FLORASTOR) 250 MG capsule Take 1 capsule (250 mg total) by mouth 2 (two) times daily. 08/09/16  Yes Patrecia Pour, MD  simvastatin (ZOCOR) 20 MG tablet Take 20 mg by mouth at bedtime.   Yes Historical Provider, MD  TOVIAZ 8 MG TB24 tablet Take 8 mg by mouth at bedtime.    Yes Historical Provider, MD  traMADol (ULTRAM) 50 MG tablet Take 1 tablet (50 mg total) by mouth every 12 (twelve) hours as needed (breakthrough pain). 08/09/16  Yes Patrecia Pour, MD    Family History Family History  Problem Relation Age of Onset  . Diabetes Mellitus II    . Hypertension      Social History Social History  Substance Use Topics  . Smoking status: Former Smoker    Packs/day: 3.00    Years: 33.00    Types: Cigarettes    Quit date: 06/19/1973  . Smokeless tobacco: Former Systems developer    Quit date: 09/26/1975  . Alcohol use No      Allergies   Nitrous oxide; Penicillins; Tape; and Diltiazem   Review of Systems Review of Systems  Constitutional: Negative for chills and fever.  HENT: Negative for congestion.   Eyes: Negative for visual disturbance.  Respiratory: Negative for shortness of breath.   Cardiovascular: Negative for chest pain.  Gastrointestinal: Negative for abdominal pain, nausea and vomiting.  Genitourinary: Negative for dysuria.  Musculoskeletal: Negative for back pain and neck pain.  Skin: Positive for color change and wound.  Neurological: Negative for headaches.     Physical Exam Updated Vital Signs BP 150/66   Pulse 84   Temp 98.1 F (36.7 C)   Resp 16   SpO2 98%   Physical Exam  Constitutional: He is oriented to person, place, and time. He appears well-developed and well-nourished. No distress.  HENT:  Head: Normocephalic and  atraumatic.  Cardiovascular: Normal rate, regular rhythm and normal heart sounds.   No murmur heard. 2+ radial pulse bilaterally.   Pulmonary/Chest: Effort normal and breath sounds normal. No respiratory distress.  Abdominal: Soft. He exhibits no distension. There is no tenderness.  Musculoskeletal:  ROM decreased 2/2 pain.   Neurological: He is alert and oriented to person, place, and time.  Skin: Skin is warm and dry. There is erythema.  See image below: area of erythema warm and tender to the touch.   Nursing note and vitals reviewed.          ED Treatments / Results  Labs (all labs ordered are listed, but only abnormal results are displayed) Labs Reviewed  COMPREHENSIVE METABOLIC PANEL - Abnormal; Notable for the following:       Result Value   Glucose, Bld 116 (*)    BUN 45 (*)    Creatinine, Ser 2.22 (*)    GFR calc non Af Amer 25 (*)    GFR calc Af Amer 29 (*)    All other components within normal limits  CBC WITH DIFFERENTIAL/PLATELET - Abnormal; Notable for the following:    WBC 12.2 (*)    RBC 3.75 (*)    Hemoglobin 11.7  (*)    HCT 36.2 (*)    Neutro Abs 9.2 (*)    Monocytes Absolute 1.2 (*)    All other components within normal limits  CULTURE, BLOOD (ROUTINE X 2)  CULTURE, BLOOD (ROUTINE X 2)  I-STAT CG4 LACTIC ACID, ED  I-STAT CG4 LACTIC ACID, ED    EKG  EKG Interpretation None       Radiology Dg Hand Complete Right  Result Date: 08/11/2016 CLINICAL DATA:  83 year old male with progressive redness and swelling of the right hand despite antibiotic therapy for cellulitis EXAM: RIGHT HAND - COMPLETE 3+ VIEW COMPARISON:  None. FINDINGS: No evidence of acute fracture or malalignment. Abnormal appearance of the distal radius with coarsening of the trabecular pattern and widening of the cortical space. Multifocal degenerative osteoarthritis including the radiocarpal joints, base of the thumb carpometacarpal joint, and the distal interphalangeal joints. Soft tissue swelling is present involving the hand and all digits. Scattered atherosclerotic vascular calcifications present along the radial artery. No radiopaque retained foreign body. IMPRESSION: 1. Diffuse soft tissue swelling affecting the hands and second through fifth digits. 2. Abnormal appearance of the distal radius which may reflect the sequelae of remote healed trauma or Paget's disease. 3. Multifocal degenerative osteoarthritis. Electronically Signed   By: Jacqulynn Cadet M.D.   On: 08/11/2016 14:32    Procedures Procedures (including critical care time)  Medications Ordered in ED Medications  vancomycin (VANCOCIN) 1,500 mg in sodium chloride 0.9 % 500 mL IVPB (not administered)  vancomycin (VANCOCIN) 1,500 mg in sodium chloride 0.9 % 500 mL IVPB (1,500 mg Intravenous New Bag/Given 08/11/16 1549)     Initial Impression / Assessment and Plan / ED Course  I have reviewed the triage vital signs and the nursing notes.  Pertinent labs & imaging results that were available during my care of the patient were reviewed by me and considered in my  medical decision making (see chart for details).     HERALD VALLIN is a 83 y.o. male who presents to ED for worsening of right hand wound. Recently admitted on 2/20 and discharged on 2/22 for same. He has been on doxycyline since discharge. Family and patient both believe that hand was healing well when he left the hospital,  but has continued to become more erythematous over the last two days. No constitutional symptoms. CBC elevated at 12.2 but trending downward from 14 upon discharge. Lactic wdl. Blood cultures obtained and started on vanc. Hospitalist consulted who will admit.   Patient seen by and discussed with Dr. Darl Householder who agrees with treatment plan.    Final Clinical Impressions(s) / ED Diagnoses   Final diagnoses:  None    New Prescriptions Current Discharge Medication List       [place], PA-C 08/11/16 Alger Yao, MD 08/11/16 [phone]

## 2016-08-11 NOTE — ED Triage Notes (Signed)
Per pt/daughter-states he fell last week injuring right hand-was seen in ED and admitted-was given IV antibiotics-was discharged last Thursday and has been on oral antibiotics-states increased redness and family feels it is not getting better

## 2016-08-12 DIAGNOSIS — Z9181 History of falling: Secondary | ICD-10-CM

## 2016-08-12 DIAGNOSIS — N184 Chronic kidney disease, stage 4 (severe): Secondary | ICD-10-CM

## 2016-08-12 LAB — CULTURE, BLOOD (ROUTINE X 2)
Culture: NO GROWTH
Culture: NO GROWTH

## 2016-08-12 LAB — GLUCOSE, CAPILLARY
Glucose-Capillary: 145 mg/dL — ABNORMAL HIGH (ref 65–99)
Glucose-Capillary: 180 mg/dL — ABNORMAL HIGH (ref 65–99)
Glucose-Capillary: 287 mg/dL — ABNORMAL HIGH (ref 65–99)
Glucose-Capillary: 233 mg/dL — ABNORMAL HIGH (ref 65–99)

## 2016-08-12 LAB — CBC
HEMATOCRIT: 30.8 % — AB (ref 39.0–52.0)
Hemoglobin: 10 g/dL — ABNORMAL LOW (ref 13.0–17.0)
MCH: 31.1 pg (ref 26.0–34.0)
MCHC: 32.5 g/dL (ref 30.0–36.0)
MCV: 95.7 fL (ref 78.0–100.0)
PLATELETS: 301 10*3/uL (ref 150–400)
RBC: 3.22 MIL/uL — ABNORMAL LOW (ref 4.22–5.81)
RDW: 13.1 % (ref 11.5–15.5)
WBC: 10.2 10*3/uL (ref 4.0–10.5)

## 2016-08-12 MED ORDER — ZOLPIDEM TARTRATE 5 MG PO TABS
5.0000 mg | ORAL_TABLET | Freq: Once | ORAL | Status: AC
  Administered 2016-08-12: 5 mg via ORAL
  Filled 2016-08-12: qty 1

## 2016-08-12 MED ORDER — INSULIN ASPART 100 UNIT/ML ~~LOC~~ SOLN
0.0000 [IU] | Freq: Three times a day (TID) | SUBCUTANEOUS | Status: DC
  Administered 2016-08-12: 8 [IU] via SUBCUTANEOUS
  Administered 2016-08-13: 2 [IU] via SUBCUTANEOUS
  Administered 2016-08-13: 5 [IU] via SUBCUTANEOUS

## 2016-08-12 MED ORDER — ZOLPIDEM TARTRATE 5 MG PO TABS: 5.0000 mg | ORAL_TABLET | Freq: Once | ORAL | Status: DC

## 2016-08-12 MED ORDER — ALBUTEROL SULFATE (2.5 MG/3ML) 0.083% IN NEBU
2.5000 mg | INHALATION_SOLUTION | Freq: Two times a day (BID) | RESPIRATORY_TRACT | Status: DC
  Administered 2016-08-12 – 2016-08-16 (×9): 2.5 mg via RESPIRATORY_TRACT
  Filled 2016-08-12 (×8): qty 3

## 2016-08-12 MED ORDER — INSULIN ASPART 100 UNIT/ML ~~LOC~~ SOLN
0.0000 [IU] | Freq: Every day | SUBCUTANEOUS | Status: DC
  Administered 2016-08-12: 2 [IU] via SUBCUTANEOUS

## 2016-08-12 NOTE — Consult Note (Signed)
[place] Nurse wound consult note Reason for Consult:Right hand infection with full thickness wound Wound type:infectious Pressure Injury POA: No Measurement:4c, x 2cm  Depth unable to be determined due to the presence of dried crust obscuring wound bed Wound bed: dry crust with serosanguinous exudate pooled in center Drainage (amount, consistency, odor) See above Periwound:Erythema receding inside ink line drawn by hospitalist Dressing procedure/placement/frequency: I will add a saline moistened gauze dressing topped with a dry dressing and loosely secured for the purpose of drawing exudate and softening crust (eschar). Elevation in place along with systemic antibiotics.  Patient will require PT/OT for maintenance of function following resolution of edema and infection. Please include in discharge plan if you agree. [place] nursing team will not follow, but will remain available to this patient, the nursing and medical teams.  Please re-consult if needed. Thanks, Maudie Flakes, MSN, RN, [place], Arther Abbott o Pager# [phone]

## 2016-08-12 NOTE — Progress Notes (Signed)
PROGRESS NOTE  Clayton [name]  RKY:[id number] DOB: Nov 11, 1927 DOA: 08/11/2016 PCP: Lujean Amel, MD   Brief Narrative: Clayton [name] a 83 y.o.malewith medical history of HTN, CKD stage IV, and IDDM  recently discharged from [facility] after a 2 day hospitalization for right hand cellulitis following a right hand wound that required debridement. He was discharged on doxycycline after improvement on IV vancomycin. Though he took doxycycline without emesis, the family noted worsening redness and that the wound began looking worse after discharge. On arrival he was afebrile. WBC and lactate were normal. Hand XR showed soft tissue swelling with no evidence of osteomyelitis. Because of the worsening erythema he was given IV vancomycin and admitted for failure of outpatient management of cellulitis.   Assessment & Plan: Principal Problem:   Cellulitis of right hand Active Problems:   Essential hypertension   Sleep apnea   Diabetes mellitus (HCC)   BPH (benign prostatic hyperplasia)   Type 2 diabetes mellitus with hyperglycemia, with long-term current use of insulin (HCC)   Chronic kidney disease (CKD), stage IV (severe) (HCC)  Cellulitis of right hand: s/p I&D 2/20, improved on vanc, but worsened when transitioned to doxycycline. Blood cultures from recent admission are negative. WBC and lactate normal on admission. MRI was performed last admission and showed no evidence of dep infection, tendonitis, myositis, or osteomyelitis.  - Demarcated in ink on admission 2/24: No recession yet.  - Continue IV vancomycin q48h (renal dosing) - Reconsulted hand surgery. Initial plan was to allow to heal by secondary intention.  - WOC consulted - Monitor blood cultures  Chronic bronchitis: No respiratory distress or hypoxemia.  - Continue symbicort, bronchodilators.  IDT2DM: Most recent HbA1c 7.0% without reported hypoglycemia. DM + age are risk factors for poor wound healing. - Restart 80% of home  medications.  CKD stage IV: Cr remains at baseline 2.22 on admission; baseline 2.3-2.6 with GFR consistently in 20's. - Monitor on vancomycin - Continue home dosing of lasix   Hyperlipidemia - Continue Zocor  Lower extremity edema: Stable. - Continue home lasix.  DVT prophylaxis: Lovenox Code Status: DNR Family Communication: Daughter at bedside Disposition Plan: Continue inpatient management of cellulitis.  Consultants:   Hand surgery, Dr. Grandville Silos  Procedures:   None  Antimicrobials:  Vancomycin 2/24 >>    Subjective: Pt feels well, just with significant redness and pain on right hand keeping him from walking with a cane.   Objective: BP 134/64 (BP Location: Left Arm)   Pulse 81   Temp 98.1 F (36.7 C) (Oral)   Resp 16   Ht 5' 10.5" (1.791 m)   Wt 110.2 kg (243 lb)   SpO2 96%   BMI 34.37 kg/m    General exam: Pleasant elderly male in no distress Respiratory system: Non-labored breathing on room air. Intermittent dry cough. Clear to auscultation bilaterally.  Cardiovascular system: Regular rate and rhythm. No murmur, rub, or gallop. No JVD, and no pedal edema. Gastrointestinal system: Abdomen soft, non-tender, non-distended, with normoactive bowel sounds. No organomegaly or masses felt. Central nervous system: Alert and oriented. No focal neurological deficits. Extremities: Warm, no deformities Skin: Right hand with eschar within ~1cm wound just proximal to 4th MCP with bright erythema extending to mid 5th digit, mid 4th digit, and proximal to distal forearm. No lymphadenopathy  Psychiatry: Judgement and insight appear normal. Mood & affect appropriate.   Data Reviewed: I have personally reviewed following labs and imaging studies  CBC:  Recent Labs Lab 08/07/16 1115 08/08/16 0559  08/09/16 0554 08/11/16 1348 08/12/16 0518  WBC 30.7* 23.7* 14.7* 12.2* 10.2  NEUTROABS 27.0*  --   --  9.2*  --   HGB 12.5* 10.6* 10.2* 11.7* 10.0*  HCT 38.3* 31.5*  31.5* 36.2* 30.8*  MCV 97.0 94.3 94.3 96.5 95.7  PLT 277 253 254 335 086   Basic Metabolic Panel:  Recent Labs Lab 08/07/16 1115 08/08/16 0559 08/09/16 0554 08/11/16 1348  NA 138 139 139 140  K 4.4 3.9 3.8 3.7  CL 104 107 107 107  CO2 '25 26 25 24  '$ GLUCOSE 177* 133* 186* 116*  BUN 41* 44* 44* 45*  CREATININE 2.28* 2.41* 2.22* 2.22*  CALCIUM 9.4 8.7* 8.9 9.2   GFR: Estimated Creatinine Clearance: 29.9 mL/min (by C-G formula based on SCr of 2.22 mg/dL (H)). Liver Function Tests:  Recent Labs Lab 08/11/16 1348  AST 22  ALT 23  ALKPHOS 75  BILITOT 0.5  PROT 7.4  ALBUMIN 3.5   No results for input(s): LIPASE, AMYLASE in the last 168 hours. No results for input(s): AMMONIA in the last 168 hours. Coagulation Profile: No results for input(s): INR, PROTIME in the last 168 hours. Cardiac Enzymes: No results for input(s): CKTOTAL, CKMB, CKMBINDEX, TROPONINI in the last 168 hours. BNP (last 3 results) No results for input(s): PROBNP in the last 8760 hours. HbA1C: No results for input(s): HGBA1C in the last 72 hours. CBG:  Recent Labs Lab 08/11/16 1842 08/11/16 1913 08/11/16 2129 08/12/16 0745 08/12/16 1120  GLUCAP 44* 102* 198* 145* 180*   Lipid Profile: No results for input(s): CHOL, HDL, LDLCALC, TRIG, CHOLHDL, LDLDIRECT in the last 72 hours. Thyroid Function Tests: No results for input(s): TSH, T4TOTAL, FREET4, T3FREE, THYROIDAB in the last 72 hours. Anemia Panel: No results for input(s): VITAMINB12, FOLATE, FERRITIN, TIBC, IRON, RETICCTPCT in the last 72 hours. Urine analysis:    Component Value Date/Time   COLORURINE YELLOW 07/19/2015 0829   APPEARANCEUR CLEAR 07/19/2015 0829   LABSPEC 1.015 07/19/2015 0829   PHURINE 6.0 07/19/2015 0829   GLUCOSEU NEGATIVE 07/19/2015 0829   HGBUR NEGATIVE 07/19/2015 0829   BILIRUBINUR NEGATIVE 07/19/2015 0829   KETONESUR NEGATIVE 07/19/2015 0829   PROTEINUR 100 (A) 06/20/2012 0603   UROBILINOGEN 0.2 07/19/2015 0829    NITRITE NEGATIVE 07/19/2015 0829   LEUKOCYTESUR NEGATIVE 07/19/2015 0829   Recent Results (from the past 240 hour(s))  Culture, blood (routine x 2)     Status: None (Preliminary result)   Collection Time: 08/07/16 11:15 AM  Result Value Ref Range Status   Specimen Description BLOOD LEFT ANTECUBITAL  Final   Special Requests BOTTLES DRAWN AEROBIC AND ANAEROBIC 5CC  Final   Culture   Final    NO GROWTH 4 DAYS Performed at [facility], [place] [address] [place], [place] [address]    Report Status PENDING  Incomplete  Culture, blood (routine x 2)     Status: None (Preliminary result)   Collection Time: 08/07/16 11:19 AM  Result Value Ref Range Status   Specimen Description BLOOD LEFT WRIST  Final   Special Requests BOTTLES DRAWN AEROBIC ONLY 5ML  Final   Culture   Final    NO GROWTH 4 DAYS Performed at [facility], [place] [address] [place], [place] [address]    Report Status PENDING  Incomplete  MRSA PCR Screening     Status: None   Collection Time: 08/07/16 10:08 PM  Result Value Ref Range Status   MRSA by PCR NEGATIVE NEGATIVE  Final    Comment:        The GeneXpert MRSA Assay (FDA approved for NASAL specimens only), is one component of a comprehensive MRSA colonization surveillance program. It is not intended to diagnose MRSA infection nor to guide or monitor treatment for MRSA infections.   Culture, blood (routine x 2)     Status: None (Preliminary result)   Collection Time: 08/11/16  1:50 PM  Result Value Ref Range Status   Specimen Description   Final    BLOOD LEFT ANTECUBITAL Performed at [facility], [place] [address] [place], [place] [address]    Special Requests NONE  Final   Culture PENDING  Incomplete   Report Status PENDING  Incomplete  Culture, blood (routine x 2)     Status: None (Preliminary result)   Collection Time: 08/11/16  2:00 PM  Result Value Ref Range Status   Specimen Description   Final    BLOOD LEFT HAND Performed at [facility], [place] [address] [place], [place] [address]    Special Requests NONE  Final   Culture PENDING  Incomplete   Report Status PENDING  Incomplete      Radiology Studies: Dg Hand Complete Right  Result Date: 08/11/2016 CLINICAL DATA:  83 year old male with progressive redness and swelling of the right hand despite antibiotic therapy for cellulitis EXAM: RIGHT HAND - COMPLETE 3+ VIEW COMPARISON:  None. FINDINGS: No evidence of acute fracture or malalignment. Abnormal appearance of the distal radius with coarsening of the trabecular pattern and widening of the cortical space. Multifocal degenerative osteoarthritis including the radiocarpal joints, base of the thumb carpometacarpal joint, and the distal interphalangeal joints. Soft tissue swelling is present involving the hand and all digits. Scattered atherosclerotic vascular calcifications present along the radial artery. No radiopaque retained foreign body. IMPRESSION: 1. Diffuse soft tissue swelling affecting the hands and second through fifth digits. 2. Abnormal appearance of the distal radius which may reflect the sequelae of remote healed trauma or Paget's disease. 3. Multifocal degenerative osteoarthritis. Electronically Signed   By: Jacqulynn Cadet M.D.   On: 08/11/2016 14:32    Scheduled Meds: . albuterol  2.5 mg Nebulization BID  . enoxaparin (LOVENOX) injection  30 mg Subcutaneous Q24H  . famotidine  20 mg Oral QHS  . ferrous sulfate  325 mg Oral Q breakfast  . fesoterodine  8 mg Oral QHS  . finasteride  5 mg Oral QHS  . furosemide  20 mg Oral Once per day on Sun Tue Thu Sat  . insulin glargine  20 Units Subcutaneous BID  . mometasone-formoterol  2 puff Inhalation BID  . montelukast  10 mg Oral QHS  . multivitamin with minerals  1 tablet Oral Daily  . pantoprazole  40 mg Oral Daily  . psyllium  1 packet Oral Daily  . saccharomyces boulardii  250 mg Oral BID  . simvastatin  20 mg Oral QHS  . [START ON 08/13/2016]  vancomycin  1,500 mg Intravenous Q48H   Continuous Infusions: . sodium chloride 75 mL/hr at 08/12/16 0944     LOS: 1 day   Time spent: 25 minutes.  Vance Gather, MD [facility] Pager [phone]  If 7PM-7AM, please contact night-coverage [email or web address] Password [email or web address] 08/12/2016, 12:18 PM

## 2016-08-12 NOTE — Consult Note (Signed)
[place] for Infectious Disease       Reason for Consult: cellulitis    Referring Physician: Dr. Bonner Puna  Principal Problem:   Cellulitis of right hand Active Problems:   Essential hypertension   Sleep apnea   Diabetes mellitus ([place])   BPH (benign prostatic hyperplasia)   Type 2 diabetes mellitus with hyperglycemia, with long-term current use of insulin (HCC)   Chronic kidney disease (CKD), stage IV (severe) ([place])   . albuterol  2.5 mg Nebulization BID  . enoxaparin (LOVENOX) injection  30 mg Subcutaneous Q24H  . famotidine  20 mg Oral QHS  . ferrous sulfate  325 mg Oral Q breakfast  . fesoterodine  8 mg Oral QHS  . finasteride  5 mg Oral QHS  . furosemide  20 mg Oral Once per day on Sun Tue Thu Sat  . insulin aspart  0-15 Units Subcutaneous TID WC  . insulin aspart  0-5 Units Subcutaneous QHS  . insulin glargine  20 Units Subcutaneous BID  . mometasone-formoterol  2 puff Inhalation BID  . montelukast  10 mg Oral QHS  . multivitamin with minerals  1 tablet Oral Daily  . pantoprazole  40 mg Oral Daily  . psyllium  1 packet Oral Daily  . saccharomyces boulardii  250 mg Oral BID  . simvastatin  20 mg Oral QHS  . [START ON 08/13/2016] vancomycin  1,500 mg Intravenous Q48H    Recommendations: Continue vancomycin Watch creat daily   Assessment: He has surrounding cellulitis at area of wound on 5th finger right hand c/w cellulitis.  Xray without osteomyelitis.     Antibiotics: vancomcyin  HPI: Clayton [name] is a 83 y.o. male with recent fall into a gaz[place] vines with resultant hand and arm wounds with his left 5th digit with cellulitis of hand with wound debrided by the ED physician 2/20 who comes back with worsening cellulitis of hand.  He was sent out on doxycycline after improvement on vancomycin but worsened since that time.  No fever, no chills.  No significant drainage from wound.  He was then discharged on 2/22 but came back last evening.  History as  above reviewed from the previous record and in discussion with the patient and family.   Hand xray independently reviewed and no lytic lesions identified.  Review of Systems:  Constitutional: negative for fevers, chills and anorexia Gastrointestinal: negative for diarrhea Integument/breast: negative for rash All other systems reviewed and are negative    Past Medical History:  Diagnosis Date  . Anemia   . Chronic renal insufficiency   . DDD (degenerative disc disease), lumbar   . Diabetes mellitus   . Hypertension   . Neuromuscular disorder ([place])   . Osteoporosis     Social History  Substance Use Topics  . Smoking status: Former Smoker    Packs/day: 3.00    Years: 33.00    Types: Cigarettes    Quit date: 06/19/1973  . Smokeless tobacco: Former Systems developer    Quit date: 09/26/1975  . Alcohol use No    Family History  Problem Relation Age of Onset  . Diabetes Mellitus II    . Hypertension      Allergies  Allergen Reactions  . Nitrous Oxide Other (See Comments)    Reaction:  Unknown   . Penicillins Other (See Comments)    Reaction:  Unknown Has patient had a PCN reaction causing immediate rash, facial/tongue/throat swelling, SOB or lightheadedness with hypotension: Unsure Has patient  had a PCN reaction causing severe rash involving mucus membranes or skin necrosis: Unsure Has patient had a PCN reaction that required hospitalization Unsure  Has patient had a PCN reaction occurring within the last 10 years: No If all of the above answers are "NO", then may proceed with Cephalosporin use.  . Tape Other (See Comments)    Reaction:  Tears pts skin   . Diltiazem Rash    Physical Exam: Constitutional: in no apparent distress and alert  Vitals:   08/12/16 0439 08/12/16 1300  BP: 134/64 (!) 146/70  Pulse: 81 80  Resp: 16 18  Temp: 98.1 F (36.7 C) 97.6 F (36.4 C)   EYES: anicteric ENMT: no thrush Cardiovascular: Cor RRR Respiratory: CTA B; normal respiratory effort GI:  Bowel sounds are normal Musculoskeletal: no pedal edema noted Skin: negatives: no rash  Lab Results  Component Value Date   WBC 10.2 08/12/2016   HGB 10.0 (L) 08/12/2016   HCT 30.8 (L) 08/12/2016   MCV 95.7 08/12/2016   PLT 301 08/12/2016    Lab Results  Component Value Date   CREATININE 2.22 (H) 08/11/2016   BUN 45 (H) 08/11/2016   NA 140 08/11/2016   K 3.7 08/11/2016   CL 107 08/11/2016   CO2 24 08/11/2016    Lab Results  Component Value Date   ALT 23 08/11/2016   AST 22 08/11/2016   ALKPHOS 75 08/11/2016     Microbiology: Recent Results (from the past 240 hour(s))  Culture, blood (routine x 2)     Status: None   Collection Time: 08/07/16 11:15 AM  Result Value Ref Range Status   Specimen Description BLOOD LEFT ANTECUBITAL  Final   Special Requests BOTTLES DRAWN AEROBIC AND ANAEROBIC 5CC  Final   Culture   Final    NO GROWTH 5 DAYS Performed at [facility], [address], [place], [place] [address]    Report Status 08/12/2016 FINAL  Final  Culture, blood (routine x 2)     Status: None   Collection Time: 08/07/16 11:19 AM  Result Value Ref Range Status   Specimen Description BLOOD LEFT WRIST  Final   Special Requests BOTTLES DRAWN AEROBIC ONLY 5ML  Final   Culture   Final    NO GROWTH 5 DAYS Performed at [facility], [place] [address], [place], [place] [address]    Report Status 08/12/2016 FINAL  Final  MRSA PCR Screening     Status: None   Collection Time: 08/07/16 10:08 PM  Result Value Ref Range Status   MRSA by PCR NEGATIVE NEGATIVE Final    Comment:        The GeneXpert MRSA Assay (FDA approved for NASAL specimens only), is one component of a comprehensive MRSA colonization surveillance program. It is not intended to diagnose MRSA infection nor to guide or monitor treatment for MRSA infections.   Culture, blood (routine x 2)     Status: None (Preliminary result)   Collection Time: 08/11/16  1:50 PM  Result Value Ref Range Status    Specimen Description BLOOD LEFT ANTECUBITAL  Final   Special Requests NONE  Final   Culture   Final    NO GROWTH < 24 HOURS Performed at [facility], [place] [address] [place],  [address]    Report Status PENDING  Incomplete  Culture, blood (routine x 2)     Status: None (Preliminary result)   Collection Time: 08/11/16  2:00 PM  Result Value Ref Range Status   Specimen Description BLOOD LEFT HAND  Final   Special Requests NONE  Final   Culture   Final    NO GROWTH < 24 HOURS Performed at [facility], [address] [place], [place] [address]    Report Status PENDING  Incomplete    Concetta Guion, Herbie [place], [place] for Infectious Disease [employer] [email or web address] [phone] pager  [phone] cell 08/12/2016, 4:38 PM

## 2016-08-13 ENCOUNTER — Encounter (HOSPITAL_COMMUNITY): Payer: Self-pay | Admitting: Certified Registered Nurse Anesthetist

## 2016-08-13 ENCOUNTER — Encounter (HOSPITAL_COMMUNITY)
Admission: EM | Disposition: A | Discharge status: Home Health | Payer: Self-pay | Source: Home / Self Care | Attending: Family Medicine

## 2016-08-13 ENCOUNTER — Inpatient Hospital Stay (HOSPITAL_COMMUNITY): Payer: Medicare PPO | Admitting: Certified Registered Nurse Anesthetist

## 2016-08-13 DIAGNOSIS — G4730 Sleep apnea, unspecified: Secondary | ICD-10-CM

## 2016-08-13 DIAGNOSIS — Z833 Family history of diabetes mellitus: Secondary | ICD-10-CM

## 2016-08-13 DIAGNOSIS — Z87891 Personal history of nicotine dependence: Secondary | ICD-10-CM

## 2016-08-13 DIAGNOSIS — B9689 Other specified bacterial agents as the cause of diseases classified elsewhere: Secondary | ICD-10-CM

## 2016-08-13 DIAGNOSIS — Z8249 Family history of ischemic heart disease and other diseases of the circulatory system: Secondary | ICD-10-CM

## 2016-08-13 DIAGNOSIS — Z91048 Other nonmedicinal substance allergy status: Secondary | ICD-10-CM

## 2016-08-13 DIAGNOSIS — L03113 Cellulitis of right upper limb: Principal | ICD-10-CM

## 2016-08-13 DIAGNOSIS — Z888 Allergy status to other drugs, medicaments and biological substances status: Secondary | ICD-10-CM

## 2016-08-13 DIAGNOSIS — Z880 Allergy status to penicillin: Secondary | ICD-10-CM

## 2016-08-13 HISTORY — PX: I & D EXTREMITY: SHX5045

## 2016-08-13 LAB — BASIC METABOLIC PANEL
Anion gap: 7 (ref 5–15)
BUN: 40 mg/dL — AB (ref 6–20)
CHLORIDE: 112 mmol/L — AB (ref 101–111)
CO2: 22 mmol/L (ref 22–32)
Calcium: 8.8 mg/dL — ABNORMAL LOW (ref 8.9–10.3)
Creatinine, Ser: 2.05 mg/dL — ABNORMAL HIGH (ref 0.61–1.24)
GFR calc Af Amer: 32 mL/min — ABNORMAL LOW (ref >60)
GFR calc non Af Amer: 28 mL/min — ABNORMAL LOW (ref >60)
GLUCOSE: 134 mg/dL — AB (ref 65–99)
POTASSIUM: 4.2 mmol/L (ref 3.5–5.1)
SODIUM: 141 mmol/L (ref 135–145)

## 2016-08-13 LAB — SURGICAL PCR SCREEN
MRSA, PCR: NEGATIVE
Staphylococcus aureus: NEGATIVE

## 2016-08-13 LAB — GLUCOSE, CAPILLARY
GLUCOSE-CAPILLARY: 140 mg/dL — AB (ref 65–99)
GLUCOSE-CAPILLARY: 96 mg/dL (ref 65–99)
Glucose-Capillary: 220 mg/dL — ABNORMAL HIGH (ref 65–99)
Glucose-Capillary: 111 mg/dL — ABNORMAL HIGH (ref 65–99)

## 2016-08-13 LAB — CBC
HEMATOCRIT: 31.1 % — AB (ref 39.0–52.0)
HEMOGLOBIN: 10.1 g/dL — AB (ref 13.0–17.0)
MCH: 31.1 pg (ref 26.0–34.0)
MCHC: 32.5 g/dL (ref 30.0–36.0)
MCV: 95.7 fL (ref 78.0–100.0)
Platelets: 302 10*3/uL (ref 150–400)
RBC: 3.25 MIL/uL — AB (ref 4.22–5.81)
RDW: 13.1 % (ref 11.5–15.5)
WBC: 11.2 10*3/uL — ABNORMAL HIGH (ref 4.0–10.5)

## 2016-08-13 SURGERY — IRRIGATION AND DEBRIDEMENT EXTREMITY
Anesthesia: General | Site: Hand | Laterality: Right

## 2016-08-13 MED ORDER — INSULIN ASPART 100 UNIT/ML ~~LOC~~ SOLN
0.0000 [IU] | Freq: Three times a day (TID) | SUBCUTANEOUS | Status: DC
  Administered 2016-08-14: 3 [IU] via SUBCUTANEOUS
  Administered 2016-08-14 (×2): 4 [IU] via SUBCUTANEOUS
  Administered 2016-08-15: 7 [IU] via SUBCUTANEOUS
  Administered 2016-08-15 – 2016-08-16 (×3): 3 [IU] via SUBCUTANEOUS
  Administered 2016-08-16: 4 [IU] via SUBCUTANEOUS

## 2016-08-13 MED ORDER — PROPOFOL 10 MG/ML IV BOLUS
INTRAVENOUS | Status: DC | PRN
  Administered 2016-08-13: 130 mg via INTRAVENOUS

## 2016-08-13 MED ORDER — FENTANYL CITRATE (PF) 100 MCG/2ML IJ SOLN
25.0000 ug | INTRAMUSCULAR | Status: DC | PRN
  Administered 2016-08-13 (×2): 25 ug via INTRAVENOUS

## 2016-08-13 MED ORDER — LIDOCAINE HCL (CARDIAC) 20 MG/ML IV SOLN
INTRAVENOUS | Status: DC | PRN
  Administered 2016-08-13: 80 mg via INTRAVENOUS

## 2016-08-13 MED ORDER — LACTATED RINGERS IV SOLN
INTRAVENOUS | Status: DC | PRN
  Administered 2016-08-13: 17:00:00 via INTRAVENOUS

## 2016-08-13 MED ORDER — FENTANYL CITRATE (PF) 100 MCG/2ML IJ SOLN
INTRAMUSCULAR | Status: AC
  Filled 2016-08-13: qty 2

## 2016-08-13 MED ORDER — BACITRACIN 500 UNIT/GM EX OINT
TOPICAL_OINTMENT | CUTANEOUS | Status: DC | PRN
  Administered 2016-08-13: 1 via TOPICAL

## 2016-08-13 MED ORDER — ENOXAPARIN SODIUM 40 MG/0.4ML ~~LOC~~ SOLN: 40.0000 mg | SUBCUTANEOUS | Status: DC

## 2016-08-13 MED ORDER — FENTANYL CITRATE (PF) 100 MCG/2ML IJ SOLN
INTRAMUSCULAR | Status: AC
  Administered 2016-08-13: 25 ug via INTRAVENOUS
  Filled 2016-08-13: qty 2

## 2016-08-13 MED ORDER — BACITRACIN ZINC 500 UNIT/GM EX OINT
TOPICAL_OINTMENT | CUTANEOUS | Status: AC
  Filled 2016-08-13: qty 28.35

## 2016-08-13 MED ORDER — PROPOFOL 10 MG/ML IV BOLUS
INTRAVENOUS | Status: AC
  Filled 2016-08-13: qty 20

## 2016-08-13 MED ORDER — SODIUM CHLORIDE 0.9 % IR SOLN
Status: DC | PRN
  Administered 2016-08-13: 1000 mL

## 2016-08-13 MED ORDER — BUPIVACAINE HCL (PF) 0.25 % IJ SOLN
INTRAMUSCULAR | Status: AC
  Filled 2016-08-13: qty 30

## 2016-08-13 MED ORDER — FENTANYL CITRATE (PF) 100 MCG/2ML IJ SOLN
INTRAMUSCULAR | Status: DC | PRN
  Administered 2016-08-13 (×2): 50 ug via INTRAVENOUS

## 2016-08-13 SURGICAL SUPPLY — 46 items
BANDAGE COBAN STERILE 2 (GAUZE/BANDAGES/DRESSINGS) IMPLANT
BLADE SURG 15 STRL LF DISP TIS (BLADE) ×1 IMPLANT
BLADE SURG 15 STRL SS (BLADE)
BNDG CMPR 9X4 STRL LF SNTH (GAUZE/BANDAGES/DRESSINGS) ×1
BNDG COHESIVE 4X5 TAN STRL (GAUZE/BANDAGES/DRESSINGS) ×1 IMPLANT
BNDG CONFORM 2 STRL LF (GAUZE/BANDAGES/DRESSINGS) IMPLANT
BNDG CONFORM 3 STRL LF (GAUZE/BANDAGES/DRESSINGS) ×1 IMPLANT
BNDG ESMARK 4X9 LF (GAUZE/BANDAGES/DRESSINGS) ×1 IMPLANT
BNDG GAUZE ELAST 4 BULKY (GAUZE/BANDAGES/DRESSINGS) ×3 IMPLANT
CORDS BIPOLAR (ELECTRODE) ×2 IMPLANT
DRAPE POUCH INSTRU U-SHP 10X18 (DRAPES) ×1 IMPLANT
DRAPE SHEET LG 3/4 BI-LAMINATE (DRAPES) ×1 IMPLANT
DRAPE SURG 17X11 SM STRL (DRAPES) ×2 IMPLANT
DRSG EMULSION OIL 3X3 NADH (GAUZE/BANDAGES/DRESSINGS) ×2 IMPLANT
ELECT PENCIL ROCKER SW 15FT (MISCELLANEOUS) IMPLANT
ELECT REM PT RETURN 9FT ADLT (ELECTROSURGICAL)
ELECTRODE REM PT RTRN 9FT ADLT (ELECTROSURGICAL) ×1 IMPLANT
GAUZE PACKING IODOFORM 1/4X15 (GAUZE/BANDAGES/DRESSINGS) ×1 IMPLANT
GAUZE SPONGE 4X4 12PLY STRL (GAUZE/BANDAGES/DRESSINGS) ×2 IMPLANT
GAUZE XEROFORM 1X8 LF (GAUZE/BANDAGES/DRESSINGS) ×1 IMPLANT
GAUZE XEROFORM 5X9 LF (GAUZE/BANDAGES/DRESSINGS) IMPLANT
GLOVE BIO SURGEON STRL SZ7.5 (GLOVE) ×2 IMPLANT
GLOVE BIOGEL PI IND STRL 8 (GLOVE) ×1 IMPLANT
GLOVE BIOGEL PI INDICATOR 8 (GLOVE) ×1
GOWN STRL REUS W/ TWL XL LVL3 (GOWN DISPOSABLE) ×1 IMPLANT
GOWN STRL REUS W/TWL XL LVL3 (GOWN DISPOSABLE) ×2
KIT BASIN OR (CUSTOM PROCEDURE TRAY) ×1 IMPLANT
NDL HYPO 25X1 1.5 SAFETY (NEEDLE) IMPLANT
NEEDLE HYPO 25X1 1.5 SAFETY (NEEDLE) IMPLANT
PACK ORTHO EXTREMITY (CUSTOM PROCEDURE TRAY) ×2 IMPLANT
PAD CAST 4YDX4 CTTN HI CHSV (CAST SUPPLIES) ×1 IMPLANT
PADDING CAST ABS 4INX4YD NS (CAST SUPPLIES)
PADDING CAST ABS COTTON 4X4 ST (CAST SUPPLIES) ×1 IMPLANT
PADDING CAST COTTON 4X4 STRL (CAST SUPPLIES) ×2
SET IRRIG Y TYPE TUR BLADDER L (SET/KITS/TRAYS/PACK) ×1 IMPLANT
SPONGE LAP 18X18 X RAY DECT (DISPOSABLE) ×1 IMPLANT
SUCTION FRAZIER HANDLE 10FR (MISCELLANEOUS) ×1
SUCTION TUBE FRAZIER 10FR DISP (MISCELLANEOUS) IMPLANT
SUT ETHILON 4 0 PS 2 18 (SUTURE) ×1 IMPLANT
SUT MNCRL AB 4-0 PS2 18 (SUTURE) IMPLANT
SUT VIC AB 4-0 P-3 18XBRD (SUTURE) IMPLANT
SUT VIC AB 4-0 P3 18 (SUTURE)
SUT VICRYL RAPIDE 4/0 PS 2 (SUTURE) ×1 IMPLANT
SYR 10ML LL (SYRINGE) IMPLANT
TOWEL OR 17X26 10 PK STRL BLUE (TOWEL DISPOSABLE) ×1 IMPLANT
UNDERPAD 30X30 INCONTINENT (UNDERPADS AND DIAPERS) ×2 IMPLANT

## 2016-08-13 NOTE — Progress Notes (Addendum)
Inpatient Diabetes Program Recommendations  AACE/ADA: New Consensus Statement on Inpatient Glycemic Control (2015)  Target Ranges:  Prepandial:   less than 140 mg/dL      Peak postprandial:   less than 180 mg/dL (1-2 hours)      Critically ill patients:  140 - 180 mg/dL   Results for Clayton [name], Clayton [name] (MRN [id number]) as of 08/13/2016 09:40  Ref. Range 08/12/2016 07:45 08/12/2016 11:20 08/12/2016 17:25 08/12/2016 20:37 08/13/2016 07:13  Glucose-Capillary Latest Ref Range: 65 - 99 mg/dL 145 (H) 180 (H) 287 (H) 233 (H) 140 (H)   Review of Glycemic Control  Diabetes history: DM 2 Outpatient Diabetes medications: Lantus 20 units BID, Novolog 15 units breakfast, 17 units lunch, 17 units supper Current orders for Inpatient glycemic control: Lantus 20 units BID, Novolog Moderate + HS scale  Inpatient Diabetes Program Recommendations:   Glucose increases into the 200's after meals. Patient takes meal coverage at home. Please consider Novolog 3-5 units TID meal coverage if patient consumes at least 50% of meals.  Thanks,  Tama Headings RN, MSN, [facility] Inpatient Diabetes Coordinator Team Pager [phone] (8a-5p)

## 2016-08-13 NOTE — Progress Notes (Signed)
Patient ID: Clayton [name], male   DOB: 09/07/1927, 83 y.o.   MRN: [id number]          [facility]  Date of Admission:  08/11/2016   Total days of antibiotics 7        Day 3 vancomycin         Principal Problem:   Cellulitis of right hand Active Problems:   Essential hypertension   Sleep apnea   Diabetes mellitus (HCC)   BPH (benign prostatic hyperplasia)   Type 2 diabetes mellitus with hyperglycemia, with long-term current use of insulin (HCC)   Chronic kidney disease (CKD), stage IV (severe) ([place])   . albuterol  2.5 mg Nebulization BID  . famotidine  20 mg Oral QHS  . ferrous sulfate  325 mg Oral Q breakfast  . fesoterodine  8 mg Oral QHS  . finasteride  5 mg Oral QHS  . furosemide  20 mg Oral Once per day on Sun Tue Thu Sat  . insulin aspart  0-15 Units Subcutaneous TID WC  . insulin aspart  0-5 Units Subcutaneous QHS  . insulin glargine  20 Units Subcutaneous BID  . mometasone-formoterol  2 puff Inhalation BID  . montelukast  10 mg Oral QHS  . multivitamin with minerals  1 tablet Oral Daily  . pantoprazole  40 mg Oral Daily  . psyllium  1 packet Oral Daily  . saccharomyces boulardii  250 mg Oral BID  . simvastatin  20 mg Oral QHS  . vancomycin  1,500 mg Intravenous Q48H    SUBJECTIVE: Clayton [name] was cleaning a gazebo in his backyard about 2 weeks ago when he tripped and fell striking his right hand and arm on the ground. He believes he initially hit some pavers and metal edging with his hand but also had contamination of his right hand wound with dirt. He immediately went inside and washed the wounds. His daughter helped him attempt to clean the wounds by using the sprayer on the kitchen sink. Over the next 48 hours he had progressive pain, redness and swelling. He began to have malodorous drainage leading to admission. He underwent bedside I&D. No cultures were obtained. He was treated with IV vancomycin then transitioned to oral doxycycline  upon discharge 2 days later. He began to have increased swelling and redness again leading to readmission 2 days ago.  Review of Systems: Review of Systems  Constitutional: Negative for chills, diaphoresis and fever.  Gastrointestinal: Negative for abdominal pain, diarrhea, nausea and vomiting.  Musculoskeletal: Positive for joint pain.    Past Medical History:  Diagnosis Date  . Anemia   . Chronic renal insufficiency   . DDD (degenerative disc disease), lumbar   . Diabetes mellitus   . Hypertension   . Neuromuscular disorder ([place])   . Osteoporosis     Social History  Substance Use Topics  . Smoking status: Former Smoker    Packs/day: 3.00    Years: 33.00    Types: Cigarettes    Quit date: 06/19/1973  . Smokeless tobacco: Former Systems developer    Quit date: 09/26/1975  . Alcohol use No    Family History  Problem Relation Age of Onset  . Diabetes Mellitus II    . Hypertension     Allergies  Allergen Reactions  . Nitrous Oxide Other (See Comments)    Reaction:  Unknown   . Penicillins Other (See Comments)    Reaction:  Unknown Has patient had a PCN reaction  causing immediate rash, facial/tongue/throat swelling, SOB or lightheadedness with hypotension: Unsure Has patient had a PCN reaction causing severe rash involving mucus membranes or skin necrosis: Unsure Has patient had a PCN reaction that required hospitalization Unsure  Has patient had a PCN reaction occurring within the last 10 years: No If all of the above answers are "NO", then may proceed with Cephalosporin use.  . Tape Other (See Comments)    Reaction:  Tears pts skin   . Diltiazem Rash    OBJECTIVE: Vitals:   08/12/16 1659 08/12/16 2034 08/13/16 0451 08/13/16 0810  BP:  (!) 147/66 139/70   Pulse:  83 79   Resp:  18 19   Temp:  97.9 F (36.6 C) 98.3 F (36.8 C)   TempSrc:  Oral Oral   SpO2: 92% 98% 96% 97%  Weight:      Height:       Body mass index is 34.37 kg/m.  Physical Exam  Constitutional:  He  is resting quietly in bed.  Musculoskeletal:  He has redness and swelling over his right fourth and fifth knuckle extending up to the base of his little finger. There is a large open abraded area in the fourth and fifth web space extending up over the dorsum of the hand. He has healing abrasions on his right elbow without evidence of infection. He has bony deformity of his right wrist and healed surgical incisions from having had osteomyelitis as a teenager.    Lab Results Lab Results  Component Value Date   WBC 11.2 (H) 08/13/2016   HGB 10.1 (L) 08/13/2016   HCT 31.1 (L) 08/13/2016   MCV 95.7 08/13/2016   PLT 302 08/13/2016    Lab Results  Component Value Date   CREATININE 2.05 (H) 08/13/2016   BUN 40 (H) 08/13/2016   NA 141 08/13/2016   K 4.2 08/13/2016   CL 112 (H) 08/13/2016   CO2 22 08/13/2016    Lab Results  Component Value Date   ALT 23 08/11/2016   AST 22 08/11/2016   ALKPHOS 75 08/11/2016   BILITOT 0.5 08/11/2016     Microbiology: Recent Results (from the past 240 hour(s))  Culture, blood (routine x 2)     Status: None   Collection Time: 08/07/16 11:15 AM  Result Value Ref Range Status   Specimen Description BLOOD LEFT ANTECUBITAL  Final   Special Requests BOTTLES DRAWN AEROBIC AND ANAEROBIC 5CC  Final   Culture   Final    NO GROWTH 5 DAYS Performed at [facility], [place] [address] [name], [place] [address]    Report Status 08/12/2016 FINAL  Final  Culture, blood (routine x 2)     Status: None   Collection Time: 08/07/16 11:19 AM  Result Value Ref Range Status   Specimen Description BLOOD LEFT WRIST  Final   Special Requests BOTTLES DRAWN AEROBIC ONLY 5ML  Final   Culture   Final    NO GROWTH 5 DAYS Performed at [facility], [place] [address], [place], [place] [address]    Report Status 08/12/2016 FINAL  Final  MRSA PCR Screening     Status: None   Collection Time: 08/07/16 10:08 PM  Result Value Ref Range Status   MRSA by PCR NEGATIVE  NEGATIVE Final    Comment:        The GeneXpert MRSA Assay (FDA approved for NASAL specimens only), is one component of a comprehensive MRSA colonization surveillance program. It is  not intended to diagnose MRSA infection nor to guide or monitor treatment for MRSA infections.   Culture, blood (routine x 2)     Status: None (Preliminary result)   Collection Time: 08/11/16  1:50 PM  Result Value Ref Range Status   Specimen Description BLOOD LEFT ANTECUBITAL  Final   Special Requests NONE  Final   Culture   Final    NO GROWTH < 24 HOURS Performed at [facility], [place] [address] [place], [place] [address]    Report Status PENDING  Incomplete  Culture, blood (routine x 2)     Status: None (Preliminary result)   Collection Time: 08/11/16  2:00 PM  Result Value Ref Range Status   Specimen Description BLOOD LEFT HAND  Final   Special Requests NONE  Final   Culture   Final    NO GROWTH < 24 HOURS Performed at [facility], [place] [address], [place], [place] [address]    Report Status PENDING  Incomplete     ASSESSMENT: He has persistent soft tissue infection of his right hand after an injury leading to gross contamination of his wounds. He is at high risk for mixed aerobic and anaerobic infection. I recommend adding piperacillin tazobactam to vancomycin after surgical specimens are obtained for stain and culture.  PLAN: 1. Continue vancomycin 2. Start piperacillin tazobactam postoperatively 3. I will follow-up tomorrow afternoon  Michel Bickers, MD [facility] for [place] [phone] pager   [phone] cell 08/13/2016, 12:11 PM

## 2016-08-13 NOTE — Anesthesia Preprocedure Evaluation (Signed)
Anesthesia Evaluation  Patient identified by MRN, date of birth, ID band Patient awake    Reviewed: Allergy & Precautions  Airway Mallampati: II  TM Distance: >3 FB     Dental   Pulmonary shortness of breath, asthma , sleep apnea , former smoker,     + wheezing      Cardiovascular hypertension,  Rhythm:Regular Rate:Normal     Neuro/Psych  Neuromuscular disease    GI/Hepatic negative GI ROS, Neg liver ROS,   Endo/Other  diabetes  Renal/GU Renal disease     Musculoskeletal  (+) Arthritis ,   Abdominal   Peds  Hematology  (+) anemia ,   Anesthesia Other Findings   Reproductive/Obstetrics                             Anesthesia Physical Anesthesia Plan  ASA: III  Anesthesia Plan: General   Post-op Pain Management:    Induction: Intravenous  Airway Management Planned: LMA  Additional Equipment:   Intra-op Plan:   Post-operative Plan: Extubation in OR  Informed Consent: I have reviewed the patients History and Physical, chart, labs and discussed the procedure including the risks, benefits and alternatives for the proposed anesthesia with the patient or authorized representative who has indicated his/her understanding and acceptance.   Dental advisory given  Plan Discussed with: CRNA and Anesthesiologist  Anesthesia Plan Comments:         Anesthesia Quick Evaluation

## 2016-08-13 NOTE — Anesthesia Procedure Notes (Signed)
Procedure Name: LMA Insertion Date/Time: 08/13/2016 5:34 PM Performed by: Belinda Block Pre-anesthesia Checklist: Patient identified, Timeout performed, Emergency Drugs available, Suction available and Patient being monitored Patient Re-evaluated:Patient Re-evaluated prior to inductionOxygen Delivery Method: Circle system utilized Preoxygenation: Pre-oxygenation with 100% oxygen Intubation Type: IV induction Ventilation: Mask ventilation without difficulty LMA: LMA with gastric port inserted LMA Size: 4.0 Number of attempts: 1 Placement Confirmation: positive ETCO2 and breath sounds checked- equal and bilateral Tube secured with: Tape (paper tape) Dental Injury: Teeth and Oropharynx as per pre-operative assessment

## 2016-08-13 NOTE — Transfer of Care (Signed)
Immediate Anesthesia Transfer of Care Note  Patient: Clayton [name]  Procedure(s) Performed: Procedure(s): IRRIGATION AND DEBRIDEMENT RIGHT ELBOW AND HAND (Right)  Patient Location: PACU  Anesthesia Type:General  Level of Consciousness: awake, oriented, patient cooperative, lethargic and responds to stimulation  Airway & Oxygen Therapy: Patient connected to face mask oxygen  Post-op Assessment: Report given to RN, Post -op Vital signs reviewed and stable and Patient moving all extremities  Post vital signs: Reviewed and stable  Last Vitals:  Vitals:   08/13/16 0451 08/13/16 1439  BP: 139/70 (!) 150/64  Pulse: 79 88  Resp: 19 20  Temp: 36.8 C 36.7 C    Last Pain:  Vitals:   08/13/16 1439  TempSrc: Oral  PainSc:       Patients Stated Pain Goal: 0 (00/93/81 8299)  Complications: No apparent anesthesia complications

## 2016-08-13 NOTE — Op Note (Signed)
08/11/2016 - 08/13/2016  6:36 PM  PATIENT:  Clayton [name]  83 y.o. male  PRE-OPERATIVE DIAGNOSIS:  Right forearm and hand wounds with infection  POST-OPERATIVE DIAGNOSIS:  Same  PROCEDURE:   1.  Right forearm wound excisional debridement to include skin and subcutaneous tissues    2.  Right forearm wound closure with rearrangement of local soft tissues (V-Y advancement flap) following excision of wound    3.  Right hand wound excisional debridement of skin    4.  Right small finger abscess incision and drainage  SURGEON: Rayvon Char. [place] Silos, MD  PHYSICIAN ASSISTANT: None  ANESTHESIA:  general  SPECIMENS:  None  DRAINS:   None  EBL:  less than 50 mL  PREOPERATIVE INDICATIONS:  [name] is a  83 y.o. male with history of a fall, sustaining multiple wounds to the right forearm and hand.  He's had persistent redness and swelling of the right hand wound, particularly encircling the proximal phalanx of the small finger  The risks benefits and alternatives were discussed with the patient preoperatively including but not limited to the risks of infection, bleeding, nerve injury, cardiopulmonary complications, the need for revision surgery, among others, and the patient verbalized understanding and consented to proceed.  OPERATIVE IMPLANTS: None  OPERATIVE PROCEDURE:  The patient was escorted to the operative theatre and placed in a supine position.  General anesthesia was administered.  A surgical "time-out" was performed during which the planned procedure, proposed operative site, and the correct patient identity were compared to the operative consent and agreement confirmed by the circulating nurse according to current facility policy.  Following application of a tourniquet to the operative extremity, the exposed skin was pre-scrubbed with a Hibiclens scrub brush before being formally prepped with Betadine and draped in the usual sterile fashion.  The limb was exsanguinated with an  Esmarch bandage and the tourniquet inflated to approximately 139mHg higher than systolic BP.  Attention was first directed to the proximal forearm, where a partial-thickness abrasion of the dermis was debrided with straight debridement down to punctate bleeding dermis.  The other wound was actually excised, to include skin and subcutaneous tissues, with sharp excision of the wound margins, and excisional debridement of the underlying subcutaneous tissues and remnant of olecranon bursa.  Once satisfied with the debridement, it was copiously irrigated, the tourniquet released, some additional hemostasis was obtained.  A temporary dressing was then applied.    Attention was shifted to the hand, where the oval wound in the dorsal fourth web space was debrided sharply of skin margins with a scalpel and scissors.  The wound base was then debrided with scraping debridement, using the back end of a Adson forcep.  This wound was then probed through the fourth web space volarly, where 2 separate volar incisions were made, one at the volar fourth web space, and one obliquely over the proximal phalanx of the small finger.  Spreading dissection was carried out in the subcutaneous plane, connecting all of these incisions to the dorsal fourth web space as well as to a dorsal ulnar longitudinal incision over the proximal phalanx.  Using running irrigated, all of these wounds were copiously irrigated.  There was no frank purulence encountered in any of them.  After copious irrigation, quarter inch iodoform packing was placed into all the new incisions as well as the fourth web space from the dorsal access point.  Attention was shifted back to the proximal wound, which was then closed through a  V-Y advancement, with 4-0 Vicryl Rapide interrupted sutures.  The 2 proximal wounds were dressed with bacitracin and Adaptic, and distally the open wound was dressed with bacitracin ointment and Xeroform.  A bulky hand/arm dressing was  then applied and the tourniquet was released.  He was taken to the recovery room in stable condition, breathing spontaneously  DISPOSITION: He'll be discharged back to the floor to continue medical management.  Packings will be pulled in the morning on Wednesday by nursing staff and standard wound care resumed.

## 2016-08-13 NOTE — Anesthesia Postprocedure Evaluation (Signed)
Anesthesia Post Note  Patient: Clayton [name]  Procedure(s) Performed: Procedure(s) (LRB): IRRIGATION AND DEBRIDEMENT RIGHT ELBOW AND HAND (Right)  Patient location during evaluation: PACU Anesthesia Type: General Level of consciousness: awake Pain management: pain level controlled Vital Signs Assessment: post-procedure vital signs reviewed and stable Respiratory status: spontaneous breathing Cardiovascular status: stable Anesthetic complications: no       Last Vitals:  Vitals:   08/13/16 1900 08/13/16 1915  BP: (!) 129/94 139/72  Pulse: 60   Resp: 15 18  Temp:  36.4 C    Last Pain:  Vitals:   08/13/16 1837  TempSrc:   PainSc: 0-No pain                 Daire Okimoto

## 2016-08-13 NOTE — Consult Note (Signed)
ORTHOPAEDIC CONSULTATION HISTORY & PHYSICAL REQUESTING PHYSICIAN: Patrecia Pour, MD  Chief Complaint: right hand infection  HPI: Clayton [name] is a 83 y.o. male who was originally admitted last week for a right hand infection following a fall in the garden.  He had a dorsal skin tear in the 4th webspace which was debrided in the emergency department.  Imaging studies at that time revealed likely no drainable fluid collection.  He was admitted for IV antibiotics, and improvement was noted prior to discharge.  He was discharged on oral antibiotics, and had some resurgence of redness and swelling, prompting readmission this weekend.  He has been again receiving IV antibiotics with elevation and wet-dry dressings as recommended by wound care.  Past Medical History:  Diagnosis Date  . Anemia   . Chronic renal insufficiency   . DDD (degenerative disc disease), lumbar   . Diabetes mellitus   . Hypertension   . Neuromuscular disorder ([place])   . Osteoporosis    Past Surgical History:  Procedure Laterality Date  . EYE SURGERY    . VASECTOMY    . VIDEO BRONCHOSCOPY Bilateral 08/25/2012   Procedure: VIDEO BRONCHOSCOPY WITHOUT FLUORO;  Surgeon: Brand Males, MD;  Location: [facility];  Service: Cardiopulmonary;  Laterality: Bilateral;   Social History   Social History  . Marital status: Widowed    Spouse name: N/A  . Number of children: N/A  . Years of education: N/A   Social History Main Topics  . Smoking status: Former Smoker    Packs/day: 3.00    Years: 33.00    Types: Cigarettes    Quit date: 06/19/1973  . Smokeless tobacco: Former Systems developer    Quit date: 09/26/1975  . Alcohol use No  . Drug use: No  . Sexual activity: No   Other Topics Concern  . None   Social History Narrative  . None   Family History  Problem Relation Age of Onset  . Diabetes Mellitus II    . Hypertension     Allergies  Allergen Reactions  . Nitrous Oxide Other (See Comments)    Reaction:  Unknown    . Penicillins Other (See Comments)    Reaction:  Unknown Has patient had a PCN reaction causing immediate rash, facial/tongue/throat swelling, SOB or lightheadedness with hypotension: Unsure Has patient had a PCN reaction causing severe rash involving mucus membranes or skin necrosis: Unsure Has patient had a PCN reaction that required hospitalization Unsure  Has patient had a PCN reaction occurring within the last 10 years: No If all of the above answers are "NO", then may proceed with Cephalosporin use.  . Tape Other (See Comments)    Reaction:  Tears pts skin   . Diltiazem Rash   Prior to Admission medications   Medication Sig Start Date End Date Taking? Authorizing Provider  albuterol (PROVENTIL HFA;VENTOLIN HFA) 108 (90 Base) MCG/ACT inhaler Inhale 1-2 puffs into the lungs every 6 (six) hours as needed for wheezing or shortness of breath.   Yes Historical Provider, MD  budesonide-formoterol (SYMBICORT) 160-4.5 MCG/ACT inhaler Inhale 2 puffs into the lungs 2 (two) times daily. 07/29/13  Yes Tanda Rockers, MD  cholecalciferol (VITAMIN D) 1000 units tablet Take 2,000 Units by mouth daily.   Yes Historical Provider, MD  doxycycline (VIBRAMYCIN) 100 MG capsule Take 1 capsule (100 mg total) by mouth 2 (two) times daily. 08/09/16  Yes Patrecia Pour, MD  famotidine (PEPCID) 20 MG tablet Take 20 mg by mouth at bedtime.  Yes Historical Provider, MD  ferrous sulfate 325 (65 FE) MG tablet Take 325 mg by mouth daily with breakfast.   Yes Historical Provider, MD  finasteride (PROSCAR) 5 MG tablet Take 5 mg by mouth at bedtime.   Yes Historical Provider, MD  insulin aspart (NOVOLOG) 100 UNIT/ML injection Inject 15-17 Units into the skin 3 (three) times daily before meals. Pt uses 15 units before breakfast, 17 units before lunch, and 17 units before dinner.   Yes Historical Provider, MD  insulin glargine (LANTUS) 100 UNIT/ML injection Inject 20 Units into the skin 2 (two) times daily.   Yes Historical  Provider, MD  MELATONIN PO Take 1 tablet by mouth at bedtime.   Yes Historical Provider, MD  montelukast (SINGULAIR) 10 MG tablet Take 10 mg by mouth at bedtime.   Yes Historical Provider, MD  Multiple Vitamin (MULTIVITAMIN WITH MINERALS) TABS tablet Take 1 tablet by mouth daily.   Yes Historical Provider, MD  omeprazole (PRILOSEC) 20 MG capsule Take 20 mg by mouth 2 (two) times daily before a meal.   Yes Historical Provider, MD  psyllium (REGULOID) 0.52 g capsule Take 0.52 g by mouth daily.   Yes Historical Provider, MD  saccharomyces boulardii (FLORASTOR) 250 MG capsule Take 1 capsule (250 mg total) by mouth 2 (two) times daily. 08/09/16  Yes Patrecia Pour, MD  simvastatin (ZOCOR) 20 MG tablet Take 20 mg by mouth at bedtime.   Yes Historical Provider, MD  TOVIAZ 8 MG TB24 tablet Take 8 mg by mouth at bedtime.    Yes Historical Provider, MD  traMADol (ULTRAM) 50 MG tablet Take 1 tablet (50 mg total) by mouth every 12 (twelve) hours as needed (breakthrough pain). 08/09/16  Yes Patrecia Pour, MD  furosemide (LASIX) 20 MG tablet TAKE 1 TABLET EVERY DAY 08/12/16   Elayne Snare, MD   Dg Hand Complete Right  Result Date: 08/11/2016 CLINICAL DATA:  83 year old male with progressive redness and swelling of the right hand despite antibiotic therapy for cellulitis EXAM: RIGHT HAND - COMPLETE 3+ VIEW COMPARISON:  None. FINDINGS: No evidence of acute fracture or malalignment. Abnormal appearance of the distal radius with coarsening of the trabecular pattern and widening of the cortical space. Multifocal degenerative osteoarthritis including the radiocarpal joints, base of the thumb carpometacarpal joint, and the distal interphalangeal joints. Soft tissue swelling is present involving the hand and all digits. Scattered atherosclerotic vascular calcifications present along the radial artery. No radiopaque retained foreign body. IMPRESSION: 1. Diffuse soft tissue swelling affecting the hands and second through fifth  digits. 2. Abnormal appearance of the distal radius which may reflect the sequelae of remote healed trauma or Paget's disease. 3. Multifocal degenerative osteoarthritis. Electronically Signed   By: Jacqulynn Cadet M.D.   On: 08/11/2016 14:32    Positive ROS: All other systems have been reviewed and were otherwise negative with the exception of those mentioned in the HPI and as above.  Physical Exam: Vitals: Refer to EMR. Constitutional:  WD, WN, NAD HEENT:  NCAT, EOMI Neuro/Psych:  Alert & oriented to person, place, and time; appropriate mood & affect Lymphatic: No generalized extremity edema or lymphadenopathy Extremities / MSK:  The extremities are normal with respect to appearance, ranges of motion, joint stability, muscle strength/tone, sensation, & perfusion except as otherwise noted:  Right hand has an oval open wound on the dorsum over the fourth web space.  However there is redness and swelling on the volar surface of the palm, at the bases  of the ring and small fingers, as well as extending on the palmar surface along the proximal phalanx of the small finger.  There is redness and swelling dorsally about P1 of the small finger with a couple of dark punctate spots like pen marks on the dorsum.  The hand is held with the digits fully extended, and there are some increased soreness with passive movement of the small finger MP joint, but it is minimal.  There is more tenderness with palpation of all the areas of redness.  There is some sloughing skin margins about the wound, and no expressible purulence  Assessment: Right hand dorsal wound, with surrounding cellulitis, particularly with redness and swelling on the palmar surface at the bases of the ring and small fingers and base of the proximal phalanx, could represent "buttonhole" process  Plan: I discussed these findings with him and his daughter.  We discussed the possibility of exploration in the operating room under appropriate  anesthesia, with incision and drainage of structures as indicated, as well as marginal wound debridement.  He is to remain nothing by mouth from this time forward, and he's been added on to the surgery schedule for today.  I have stopped his Lovenox, and applied SCDs.  Consent discussion held, awaiting execution of document.  Patient limb signed.  Rayvon Char Grandville Silos, [place], [place]  [address] Office: [phone] Mobile: [phone]  08/13/2016, 11:08 AM

## 2016-08-13 NOTE — Progress Notes (Signed)
PROGRESS NOTE  Clayton [name]  MVH:[id number] DOB: 21-Jan-1928 DOA: 08/11/2016 PCP: [place] Amel, MD   Brief Narrative: Clayton [name] a 83 y.o.malewith medical history of HTN, CKD stage IV, and IDDM  recently discharged from [facility] after a 2 day hospitalization for right hand cellulitis following a right hand wound that required debridement. He was discharged on doxycycline after improvement on IV vancomycin. Though he took doxycycline without emesis, the family noted worsening redness and that the wound began looking worse after discharge. On arrival he was afebrile. WBC and lactate were normal. Hand XR showed soft tissue swelling with no evidence of osteomyelitis. Because of the worsening erythema he was given IV vancomycin and admitted for failure of outpatient management of cellulitis. ID was consulted and hand surgery is planning operative debridement 2/26.   Assessment & Plan: Principal Problem:   Cellulitis of right hand Active Problems:   Essential hypertension   Sleep apnea   Diabetes mellitus (HCC)   BPH (benign prostatic hyperplasia)   Type 2 diabetes mellitus with hyperglycemia, with long-term current use of insulin (HCC)   Chronic kidney disease (CKD), stage IV (severe) (HCC)  Cellulitis of right hand: s/p I&D 2/20, improved on vanc, but worsened when transitioned to doxycycline. Blood cultures from recent admission are negative. WBC and lactate normal on admission. MRI was performed last admission and showed no evidence of dep infection, tendonitis, myositis, or osteomyelitis.  - Demarcated in ink on admission 2/24: No recession yet.  - Continue IV vancomycin q48h (renal dosing) - Reconsulted hand surgery, plan to OR for debridement 2/26.  - Follow up intraoperative cultures when available.  - WOC consulted - Monitor blood cultures  Chronic bronchitis: No respiratory distress or hypoxemia. He has very coarse breath sounds which are his baseline. - Continue symbicort,  bronchodilators.  IDT2DM: Most recent HbA1c 7.0% without reported hypoglycemia. DM + age are risk factors for poor wound healing. Had CBG 44.  - Restarted 80% of long-acting insulin. FBS in 140's. Hyperglycemic postprandials, will augment SSI to resistant.   CKD stage IV: Cr remains at baseline 2.22 on admission; baseline 2.3-2.6 with GFR consistently in 20's. - Monitor on vancomycin - Continue home dosing of lasix  Hyperlipidemia - Continue Zocor  Lower extremity edema: Stable. - Continue home lasix.  DVT prophylaxis: Lovenox Code Status: DNR Family Communication: Daughter at bedside Disposition Plan: Continue inpatient management of cellulitis.  Consultants:   Hand surgery, Dr. Grandville [name]  ID, Dr. Megan [name]  Procedures:   None  Antimicrobials:  Vancomycin 2/24 >>    Subjective: Pt feels generally well with limited pain. Has been ambulating with assistance, feels very weak. Redness has not improved   Objective: BP 139/70 (BP Location: Left Arm)   Pulse 79   Temp 98.3 F (36.8 C) (Oral)   Resp 19   Ht 5' 10.5" (1.791 m)   Wt 110.2 kg (243 lb)   SpO2 97%   BMI 34.37 kg/m    General exam: Pleasant elderly male in no distress Respiratory system: Non-labored breathing on room air. Intermittent dry cough. Clear to auscultation bilaterally.  Cardiovascular system: Regular rate and rhythm. No murmur, rub, or gallop. No JVD, and no pedal edema. Gastrointestinal system: Abdomen soft, non-tender, non-distended, with normoactive bowel sounds. No organomegaly or masses felt. Central nervous system: Alert and oriented. No focal neurological deficits. Extremities: Warm, no deformities Skin: Right hand with ~1cm wound just proximal to 4th MCP with significant slough. There is bright erythema extending to mid 5th digit,  mid 4th digit, and proximal to distal forearm. No recession from demarcations on 5th finger, and some erythema noted on palmar surface. No lymphadenopathy    Psychiatry: Judgement and insight appear normal. Mood & affect appropriate.   Data Reviewed: I have personally reviewed following labs and imaging studies  CBC:  Recent Labs Lab 08/07/16 1115 08/08/16 0559 08/09/16 0554 08/11/16 1348 08/12/16 0518 08/13/16 0515  WBC 30.7* 23.7* 14.7* 12.2* 10.2 11.2*  NEUTROABS 27.0*  --   --  9.2*  --   --   HGB 12.5* 10.6* 10.2* 11.7* 10.0* 10.1*  HCT 38.3* 31.5* 31.5* 36.2* 30.8* 31.1*  MCV 97.0 94.3 94.3 96.5 95.7 95.7  PLT 277 253 254 335 301 675   Basic Metabolic Panel:  Recent Labs Lab 08/07/16 1115 08/08/16 0559 08/09/16 0554 08/11/16 1348 08/13/16 0515  NA 138 139 139 140 141  K 4.4 3.9 3.8 3.7 4.2  CL 104 107 107 107 112*  CO2 '25 26 25 24 22  '$ GLUCOSE 177* 133* 186* 116* 134*  BUN 41* 44* 44* 45* 40*  CREATININE 2.28* 2.41* 2.22* 2.22* 2.05*  CALCIUM 9.4 8.7* 8.9 9.2 8.8*   GFR: Estimated Creatinine Clearance: 32.4 mL/min (by C-G formula based on SCr of 2.05 mg/dL (H)). Liver Function Tests:  Recent Labs Lab 08/11/16 1348  AST 22  ALT 23  ALKPHOS 75  BILITOT 0.5  PROT 7.4  ALBUMIN 3.5   No results for input(s): LIPASE, AMYLASE in the last 168 hours. No results for input(s): AMMONIA in the last 168 hours. Coagulation Profile: No results for input(s): INR, PROTIME in the last 168 hours. Cardiac Enzymes: No results for input(s): CKTOTAL, CKMB, CKMBINDEX, TROPONINI in the last 168 hours. BNP (last 3 results) No results for input(s): PROBNP in the last 8760 hours. HbA1C: No results for input(s): HGBA1C in the last 72 hours. CBG:  Recent Labs Lab 08/12/16 1120 08/12/16 1725 08/12/16 2037 08/13/16 0713 08/13/16 1134  GLUCAP 180* 287* 233* 140* 220*   Lipid Profile: No results for input(s): CHOL, HDL, LDLCALC, TRIG, CHOLHDL, LDLDIRECT in the last 72 hours. Thyroid Function Tests: No results for input(s): TSH, T4TOTAL, FREET4, T3FREE, THYROIDAB in the last 72 hours. Anemia Panel: No results for  input(s): VITAMINB12, FOLATE, FERRITIN, TIBC, IRON, RETICCTPCT in the last 72 hours. Urine analysis:    Component Value Date/Time   COLORURINE YELLOW 07/19/2015 0829   APPEARANCEUR CLEAR 07/19/2015 0829   LABSPEC 1.015 07/19/2015 0829   PHURINE 6.0 07/19/2015 0829   GLUCOSEU NEGATIVE 07/19/2015 0829   HGBUR NEGATIVE 07/19/2015 0829   BILIRUBINUR NEGATIVE 07/19/2015 0829   KETONESUR NEGATIVE 07/19/2015 0829   PROTEINUR 100 (A) 06/20/2012 0603   UROBILINOGEN 0.2 07/19/2015 0829   NITRITE NEGATIVE 07/19/2015 0829   LEUKOCYTESUR NEGATIVE 07/19/2015 0829   Recent Results (from the past 240 hour(s))  Culture, blood (routine x 2)     Status: None   Collection Time: 08/07/16 11:15 AM  Result Value Ref Range Status   Specimen Description BLOOD LEFT ANTECUBITAL  Final   Special Requests BOTTLES DRAWN AEROBIC AND ANAEROBIC 5CC  Final   Culture   Final    NO GROWTH 5 DAYS Performed at [facility], [place] [address] [place], [place] [address]    Report Status 08/12/2016 FINAL  Final  Culture, blood (routine x 2)     Status: None   Collection Time: 08/07/16 11:19 AM  Result Value Ref Range Status   Specimen Description BLOOD LEFT WRIST  Final   Special Requests BOTTLES DRAWN AEROBIC ONLY 5ML  Final   Culture   Final    NO GROWTH 5 DAYS Performed at [facility], [address], [place], [place] [address]    Report Status 08/12/2016 FINAL  Final  MRSA PCR Screening     Status: None   Collection Time: 08/07/16 10:08 PM  Result Value Ref Range Status   MRSA by PCR NEGATIVE NEGATIVE Final    Comment:        The GeneXpert MRSA Assay (FDA approved for NASAL specimens only), is one component of a comprehensive MRSA colonization surveillance program. It is not intended to diagnose MRSA infection nor to guide or monitor treatment for MRSA infections.   Culture, blood (routine x 2)     Status: None (Preliminary result)   Collection Time: 08/11/16  1:50 PM  Result Value Ref  Range Status   Specimen Description BLOOD LEFT ANTECUBITAL  Final   Special Requests NONE  Final   Culture   Final    NO GROWTH < 24 HOURS Performed at [facility], [place] [address], [place], [place] [address]    Report Status PENDING  Incomplete  Culture, blood (routine x 2)     Status: None (Preliminary result)   Collection Time: 08/11/16  2:00 PM  Result Value Ref Range Status   Specimen Description BLOOD LEFT HAND  Final   Special Requests NONE  Final   Culture   Final    NO GROWTH < 24 HOURS Performed at [facility], [place] [address], [place],  [address]    Report Status PENDING  Incomplete      Radiology Studies: Dg Hand Complete Right  Result Date: 08/11/2016 CLINICAL DATA:  83 year old male with progressive redness and swelling of the right hand despite antibiotic therapy for cellulitis EXAM: RIGHT HAND - COMPLETE 3+ VIEW COMPARISON:  None. FINDINGS: No evidence of acute fracture or malalignment. Abnormal appearance of the distal radius with coarsening of the trabecular pattern and widening of the cortical space. Multifocal degenerative osteoarthritis including the radiocarpal joints, base of the thumb carpometacarpal joint, and the distal interphalangeal joints. Soft tissue swelling is present involving the hand and all digits. Scattered atherosclerotic vascular calcifications present along the radial artery. No radiopaque retained foreign body. IMPRESSION: 1. Diffuse soft tissue swelling affecting the hands and second through fifth digits. 2. Abnormal appearance of the distal radius which may reflect the sequelae of remote healed trauma or Paget's disease. 3. Multifocal degenerative osteoarthritis. Electronically Signed   By: Jacqulynn Cadet M.D.   On: 08/11/2016 14:32    Scheduled Meds: . albuterol  2.5 mg Nebulization BID  . famotidine  20 mg Oral QHS  . ferrous sulfate  325 mg Oral Q breakfast  . fesoterodine  8 mg Oral QHS  . finasteride  5 mg Oral QHS   . furosemide  20 mg Oral Once per day on Sun Tue Thu Sat  . insulin aspart  0-15 Units Subcutaneous TID WC  . insulin aspart  0-5 Units Subcutaneous QHS  . insulin glargine  20 Units Subcutaneous BID  . mometasone-formoterol  2 puff Inhalation BID  . montelukast  10 mg Oral QHS  . multivitamin with minerals  1 tablet Oral Daily  . pantoprazole  40 mg Oral Daily  . psyllium  1 packet Oral Daily  . saccharomyces boulardii  250 mg Oral BID  . simvastatin  20 mg Oral QHS  .  vancomycin  1,500 mg Intravenous Q48H   Continuous Infusions: . sodium chloride 75 mL/hr at 08/13/16 1020     LOS: 2 days   Time spent: 25 minutes.  Vance Gather, MD [facility] Pager [phone]  If 7PM-7AM, please contact night-coverage [email or web address] Password [email or web address] 08/13/2016, 12:56 PM

## 2016-08-14 ENCOUNTER — Encounter (HOSPITAL_COMMUNITY): Payer: Self-pay | Admitting: Orthopedic Surgery

## 2016-08-14 DIAGNOSIS — Z9889 Other specified postprocedural states: Secondary | ICD-10-CM

## 2016-08-14 LAB — BASIC METABOLIC PANEL
Anion gap: 5 (ref 5–15)
BUN: 31 mg/dL — ABNORMAL HIGH (ref 6–20)
CALCIUM: 8.4 mg/dL — AB (ref 8.9–10.3)
CO2: 22 mmol/L (ref 22–32)
Chloride: 112 mmol/L — ABNORMAL HIGH (ref 101–111)
Creatinine, Ser: 1.75 mg/dL — ABNORMAL HIGH (ref 0.61–1.24)
GFR calc Af Amer: 39 mL/min — ABNORMAL LOW (ref >60)
GFR, EST NON AFRICAN AMERICAN: 34 mL/min — AB (ref >60)
GLUCOSE: 184 mg/dL — AB (ref 65–99)
Potassium: 4.6 mmol/L (ref 3.5–5.1)
Sodium: 139 mmol/L (ref 135–145)

## 2016-08-14 LAB — GLUCOSE, CAPILLARY
GLUCOSE-CAPILLARY: 189 mg/dL — AB (ref 65–99)
GLUCOSE-CAPILLARY: 171 mg/dL — AB (ref 65–99)
GLUCOSE-CAPILLARY: 182 mg/dL — AB (ref 65–99)
Glucose-Capillary: 141 mg/dL — ABNORMAL HIGH (ref 65–99)

## 2016-08-14 LAB — CBC
HCT: 30.9 % — ABNORMAL LOW (ref 39.0–52.0)
Hemoglobin: 10.1 g/dL — ABNORMAL LOW (ref 13.0–17.0)
MCH: 31.3 pg (ref 26.0–34.0)
MCHC: 32.7 g/dL (ref 30.0–36.0)
MCV: 95.7 fL (ref 78.0–100.0)
PLATELETS: 308 10*3/uL (ref 150–400)
RBC: 3.23 MIL/uL — ABNORMAL LOW (ref 4.22–5.81)
RDW: 13 % (ref 11.5–15.5)
WBC: 13.6 10*3/uL — ABNORMAL HIGH (ref 4.0–10.5)

## 2016-08-14 MED ORDER — BACITRACIN ZINC 500 UNIT/GM EX OINT
TOPICAL_OINTMENT | Freq: Every day | CUTANEOUS | Status: DC
  Administered 2016-08-15 – 2016-08-16 (×2): 16.6667 via TOPICAL
  Filled 2016-08-14: qty 28.35

## 2016-08-14 MED ORDER — METRONIDAZOLE 500 MG PO TABS
500.0000 mg | ORAL_TABLET | Freq: Three times a day (TID) | ORAL | Status: DC
  Administered 2016-08-14 – 2016-08-16 (×7): 500 mg via ORAL
  Filled 2016-08-14 (×7): qty 1

## 2016-08-14 MED ORDER — ZOLPIDEM TARTRATE 5 MG PO TABS
5.0000 mg | ORAL_TABLET | Freq: Every evening | ORAL | Status: DC | PRN
  Administered 2016-08-14: 5 mg via ORAL
  Filled 2016-08-14: qty 1

## 2016-08-14 MED ORDER — ENOXAPARIN SODIUM 40 MG/0.4ML ~~LOC~~ SOLN
40.0000 mg | SUBCUTANEOUS | Status: DC
  Administered 2016-08-15 – 2016-08-16 (×2): 40 mg via SUBCUTANEOUS
  Filled 2016-08-14 (×2): qty 0.4

## 2016-08-14 MED ORDER — LEVOFLOXACIN 250 MG PO TABS
250.0000 mg | ORAL_TABLET | Freq: Every day | ORAL | Status: DC
  Administered 2016-08-14 – 2016-08-15 (×2): 250 mg via ORAL
  Filled 2016-08-14 (×3): qty 1

## 2016-08-14 MED ORDER — TRAZODONE HCL 50 MG PO TABS
50.0000 mg | ORAL_TABLET | Freq: Every evening | ORAL | Status: DC | PRN
  Administered 2016-08-14 – 2016-08-15 (×2): 50 mg via ORAL
  Filled 2016-08-14 (×2): qty 1

## 2016-08-14 NOTE — Progress Notes (Signed)
[place] [name] for Infectious Disease  Date of Admission:  08/11/2016   Total days of antibiotics 8        Day 4 vancomycin         Principal Problem:   Cellulitis of right hand Active Problems:   Essential hypertension   Sleep apnea   Diabetes mellitus (HCC)   BPH (benign prostatic hyperplasia)   Type 2 diabetes mellitus with hyperglycemia, with long-term current use of insulin (HCC)   Chronic kidney disease (CKD), stage IV (severe) ([place])   . albuterol  2.5 mg Nebulization BID  . [START ON 08/15/2016] enoxaparin (LOVENOX) injection  40 mg Subcutaneous Q24H  . famotidine  20 mg Oral QHS  . ferrous sulfate  325 mg Oral Q breakfast  . fesoterodine  8 mg Oral QHS  . finasteride  5 mg Oral QHS  . furosemide  20 mg Oral Once per day on Sun Tue Thu Sat  . insulin aspart  0-20 Units Subcutaneous TID WC  . insulin aspart  0-5 Units Subcutaneous QHS  . insulin glargine  20 Units Subcutaneous BID  . mometasone-formoterol  2 puff Inhalation BID  . montelukast  10 mg Oral QHS  . multivitamin with minerals  1 tablet Oral Daily  . pantoprazole  40 mg Oral Daily  . psyllium  1 packet Oral Daily  . saccharomyces boulardii  250 mg Oral BID  . simvastatin  20 mg Oral QHS  . vancomycin  1,500 mg Intravenous Q48H    SUBJECTIVE: Clayton [name] has been walking up in the fall today. Clayton [name] states that his right hand pain is minimal. Clayton [name] underwent debridement of his right arm and hand wounds yesterday.  Review of Systems: Review of Systems  Constitutional: Negative for chills, diaphoresis and fever.  Gastrointestinal: Negative for abdominal pain, diarrhea, nausea and vomiting.  Musculoskeletal: Positive for joint pain.    Past Medical History:  Diagnosis Date  . Anemia   . Chronic renal insufficiency   . DDD (degenerative disc disease), lumbar   . Diabetes mellitus   . Hypertension   . Neuromuscular disorder ([place])   . Osteoporosis     Social History  Substance Use Topics  . Smoking  status: Former Smoker    Packs/day: 3.00    Years: 33.00    Types: Cigarettes    Quit date: 06/19/1973  . Smokeless tobacco: Former Systems developer    Quit date: 09/26/1975  . Alcohol use No    Family History  Problem Relation Age of Onset  . Diabetes Mellitus II    . Hypertension     Allergies  Allergen Reactions  . Nitrous Oxide Other (See Comments)    Reaction:  Unknown   . Penicillins Other (See Comments)    Reaction:  Unknown Has patient had a PCN reaction causing immediate rash, facial/tongue/throat swelling, SOB or lightheadedness with hypotension: Unsure Has patient had a PCN reaction causing severe rash involving mucus membranes or skin necrosis: Unsure Has patient had a PCN reaction that required hospitalization Unsure  Has patient had a PCN reaction occurring within the last 10 years: No If all of the above answers are "NO", then may proceed with Cephalosporin use.  . Tape Other (See Comments)    Reaction:  Tears pts skin   . Diltiazem Rash    OBJECTIVE: Vitals:   08/13/16 1915 08/13/16 1940 08/14/16 0336 08/14/16 0834  BP: 139/72 (!) 150/60 (!) 123/53   Pulse:  82 89  78  Resp: '18 16 18 18  '$ Temp: 97.5 F (36.4 C) 98.6 F (37 C) 98.8 F (37.1 C)   TempSrc:  Oral Oral   SpO2: 96% 97% 96% 98%  Weight:      Height:       Body mass index is 34.37 kg/m.  Physical Exam  Constitutional: Clayton [name] is oriented to person, place, and time.  Clayton [name] is sitting up in a chair. Clayton [name] is in good spirits.  Musculoskeletal:  His right arm and hand are in a bulky surgical dressing.  Neurological: Clayton [name] is alert and oriented to person, place, and time.  Skin: No rash noted.  Psychiatric: Mood and affect normal.    Lab Results Lab Results  Component Value Date   WBC 13.6 (H) 08/14/2016   HGB 10.1 (L) 08/14/2016   HCT 30.9 (L) 08/14/2016   MCV 95.7 08/14/2016   PLT 308 08/14/2016    Lab Results  Component Value Date   CREATININE 1.75 (H) 08/14/2016   BUN 31 (H) 08/14/2016   NA 139  08/14/2016   K 4.6 08/14/2016   CL 112 (H) 08/14/2016   CO2 22 08/14/2016    Lab Results  Component Value Date   ALT 23 08/11/2016   AST 22 08/11/2016   ALKPHOS 75 08/11/2016   BILITOT 0.5 08/11/2016     Microbiology: Recent Results (from the past 240 hour(s))  Culture, blood (routine x 2)     Status: None   Collection Time: 08/07/16 11:15 AM  Result Value Ref Range Status   Specimen Description BLOOD LEFT ANTECUBITAL  Final   Special Requests BOTTLES DRAWN AEROBIC AND ANAEROBIC 5CC  Final   Culture   Final    NO GROWTH 5 DAYS Performed at [facility], [place] [address], [place], [place] [address]    Report Status 08/12/2016 FINAL  Final  Culture, blood (routine x 2)     Status: None   Collection Time: 08/07/16 11:19 AM  Result Value Ref Range Status   Specimen Description BLOOD LEFT WRIST  Final   Special Requests BOTTLES DRAWN AEROBIC ONLY 5ML  Final   Culture   Final    NO GROWTH 5 DAYS Performed at [facility], [place] [address] [place], [place] [address]    Report Status 08/12/2016 FINAL  Final  MRSA PCR Screening     Status: None   Collection Time: 08/07/16 10:08 PM  Result Value Ref Range Status   MRSA by PCR NEGATIVE NEGATIVE Final    Comment:        The GeneXpert MRSA Assay (FDA approved for NASAL specimens only), is one component of a comprehensive MRSA colonization surveillance program. It is not intended to diagnose MRSA infection nor to guide or monitor treatment for MRSA infections.   Culture, blood (routine x 2)     Status: None (Preliminary result)   Collection Time: 08/11/16  1:50 PM  Result Value Ref Range Status   Specimen Description BLOOD LEFT ANTECUBITAL  Final   Special Requests NONE  Final   Culture   Final    NO GROWTH 3 DAYS Performed at [facility], [place] [address], [place], [place] [address]    Report Status PENDING  Incomplete  Culture, blood (routine x 2)     Status: None (Preliminary result)   Collection Time:  08/11/16  2:00 PM  Result Value Ref Range Status   Specimen Description BLOOD LEFT HAND  Final   Special  Requests NONE  Final   Culture   Final    NO GROWTH 3 DAYS Performed at [facility], [place] [address] [place], [place] [address]    Report Status PENDING  Incomplete  Surgical PCR screen     Status: None   Collection Time: 08/13/16  1:53 PM  Result Value Ref Range Status   MRSA, PCR NEGATIVE NEGATIVE Final   Staphylococcus aureus NEGATIVE NEGATIVE Final    Comment:        The Xpert SA Assay (FDA approved for NASAL specimens in patients over 39 years of age), is one component of a comprehensive surveillance program.  Test performance has been validated by [facility] for patients greater than or equal to 48 year old. It is not intended to diagnose infection nor to guide or monitor treatment.      ASSESSMENT: Dr. Grandville Silos reported that purulence was found at the time of his debridement yesterday. Clayton [name] had some tingling on his lower abdomen and some sort of rash about 5 years ago when Clayton [name] was taking a penicillin-based antibiotic. Given the level of contamination of his wound following his original injury I will broaden his antibiotic coverage by adding oral levofloxacin and metronidazole.  PLAN: 1. Continue vancomycin 2. Start levofloxacin and metronidazole  Michel Bickers, MD [facility] for Infectious [name] [phone] pager   [phone] cell 08/14/2016, 2:55 PM

## 2016-08-14 NOTE — Progress Notes (Signed)
Pharmacy Antibiotic Note  Clayton [name] is a 83 y.o. male admitted on 08/11/2016 with cellulitis.  Pharmacy has been consulted for Vancomycin dosing.  Currently on day#4 Vancomycin.  He had I&D yesterday (no culture taken).   ID recommends adding Zosyn for polymicrobial coverage.  Pt has hx of PCN allergy. D/W Dr Megan Salon he will discuss allergy with patient further and order additional antibiotics if needed at his visit today.   08/14/2016:  Afebrile  WBC trending up   Scr improving, UOP not recorded.  NCrCl ~28m/min.  Plan: Continue Vancomycin 1500 mg IV q48h. Check Vancomycin trough at steady state Monitor renal function and cx data  Additional polymicrobial coverage per ID    Height: 5' 10.5" (179.1 cm) Weight: 243 lb (110.2 kg) IBW/kg (Calculated) : 74.15  Temp (24hrs), Avg:98.3 F (36.8 C), Min:97.5 F (36.4 C), Max:98.8 F (37.1 C)   Recent Labs Lab 08/08/16 0559 08/09/16 0554 08/11/16 1348 08/11/16 1400 08/12/16 0518 08/13/16 0515 08/14/16 0507  WBC 23.7* 14.7* 12.2*  --  10.2 11.2* 13.6*  CREATININE 2.41* 2.22* 2.22*  --   --  2.05* 1.75*  LATICACIDVEN  --   --   --  1.34  --   --   --     Estimated Creatinine Clearance: 38 mL/min (by C-G formula based on SCr of 1.75 mg/dL (H)).    Allergies  Allergen Reactions  . Nitrous Oxide Other (See Comments)    Reaction:  Unknown   . Penicillins Other (See Comments)    Reaction:  Unknown Has patient had a PCN reaction causing immediate rash, facial/tongue/throat swelling, SOB or lightheadedness with hypotension: Unsure Has patient had a PCN reaction causing severe rash involving mucus membranes or skin necrosis: Unsure Has patient had a PCN reaction that required hospitalization Unsure  Has patient had a PCN reaction occurring within the last 10 years: No If all of the above answers are "NO", then may proceed with Cephalosporin use.  . Tape Other (See Comments)    Reaction:  Tears pts skin   . Diltiazem Rash     Thank you for allowing pharmacy to be a part of this patient's care.  LBiagio Borg2/27/2018 8:53 AM

## 2016-08-14 NOTE — Progress Notes (Addendum)
PROGRESS NOTE  Clayton [name]  AOZ:[id number] DOB: [birth year] DOA: 08/11/2016 PCP: Lujean Amel, MD   Brief Narrative: Clayton [name] a 83 y.o.malewith medical history of HTN, CKD stage IV, and IDDM  recently discharged from [facility] after a 2 day hospitalization for right hand cellulitis following a right hand wound that required debridement. He was discharged on doxycycline after improvement on IV vancomycin. Though he took doxycycline without emesis, the family noted worsening redness and that the wound began looking worse after discharge. On arrival he was afebrile. WBC and lactate were normal. Hand XR showed soft tissue swelling with no evidence of osteomyelitis. Because of the worsening erythema he was given IV vancomycin and admitted for failure of outpatient management of cellulitis. Hand surgery was consulted and took the patient to the OR for debridement 2/26. ID was consulted and has guided antibiotic recommendations.   Assessment & Plan: Principal Problem:   Cellulitis of right hand Active Problems:   Essential hypertension   Sleep apnea   Diabetes mellitus (HCC)   BPH (benign prostatic hyperplasia)   Type 2 diabetes mellitus with hyperglycemia, with long-term current use of insulin (HCC)   Chronic kidney disease (CKD), stage IV (severe) (HCC)  Cellulitis of right hand: s/p operative debridement 2/26 by hand surgery, Dr. Grandville Silos. Had been recently admitted and was s/p I&D 2/20, improved on vanc, but worsened when transitioned to doxycycline. Blood cultures from recent admission are negative. WBC and lactate normal on admission. MRI was performed last admission and showed no evidence of dep infection, tendonitis, myositis, or osteomyelitis.  - Demarcated in ink on admission 2/24, not improving 2/26 prior to I&D. - Continue IV vancomycin q48h (renal dosing) and will start polymicrobial coverage per ID recommendations (considering zosyn, though pt has remote history of unknown  reaction to PCN). - Monitor blood cultures - No purulence found intraoperatively, so no cultures obtained. - Monitor leukocytosis, upward trend may be related to procedure. - Will restart lovenox in AM.  Chronic bronchitis: No respiratory distress or hypoxemia. He has very coarse breath sounds which are his baseline. - Continue symbicort, bronchodilators. Steroids would impair wound healing, compromise immune system and cause hyperglycemia.   IDT2DM: Most recent HbA1c 7.0% without reported hypoglycemia. DM + age are risk factors for poor wound healing.  - Restarted 80% of long-acting insulin. FBS in 140's. Hyperglycemic postprandials, so augmented SSI to resistant.   CKD stage IV: Cr remains at baseline 2.22 on admission; baseline 2.3-2.6 with GFR consistently in 20's. Cr 1.75 today, will defer any changes in dosing to pharmacy if indicated. - Monitor on vancomycin - Continue home dosing of lasix  Hyperlipidemia - Continue Zocor  Lower extremity edema: Stable. - Continue home lasix.  Insomnia: Chronic. Takes melatonin at home, but can't get that here. - Ordered trazodone '50mg'$  qHS prn and if that fails, may have ambien '5mg'$  qHS prn after discussing risks of this with his age and chronic lung disease.   DVT prophylaxis: Lovenox Code Status: DNR Family Communication: None at bedside this morning Disposition Plan: Continue inpatient management of cellulitis s/p debridement 2/26.  Consultants:   Hand surgery, Dr. Grandville Silos  ID, Dr. Megan Salon  Procedures:   None  Antimicrobials:  Vancomycin 2/24 >>    Subjective: Felt tired last night after operation but couldn't sleep all night. Usually takes melatonin at night. Pain is controlled on oral pain medications. No dyspnea or chest pain. No acute leg swelling or pain.   Objective: BP (!) 123/53 (BP Location: Left  Arm)   Pulse 78   Temp 98.8 F (37.1 C) (Oral)   Resp 18   Ht 5' 10.5" (1.791 m)   Wt 110.2 kg (243 lb)   SpO2  98%   BMI 34.37 kg/m    General exam: Pleasant elderly male in no distress Respiratory system: Non-labored breathing on room air. Intermittent dry cough. Clear to auscultation bilaterally.  Cardiovascular system: Regular rate and rhythm. No murmur, rub, or gallop. No JVD, and no pedal edema. Gastrointestinal system: Abdomen soft, non-tender, non-distended, with normoactive bowel sounds. No organomegaly or masses felt. Central nervous system: Alert and oriented. No focal neurological deficits. Extremities: Warm, no deformities Skin: Right hand and arm in cast , not examined today.  Psychiatry: Judgement and insight appear normal. Mood & affect appropriate.   Data Reviewed: I have personally reviewed following labs and imaging studies  CBC:  Recent Labs Lab 08/09/16 0554 08/11/16 1348 08/12/16 0518 08/13/16 0515 08/14/16 0507  WBC 14.7* 12.2* 10.2 11.2* 13.6*  NEUTROABS  --  9.2*  --   --   --   HGB 10.2* 11.7* 10.0* 10.1* 10.1*  HCT 31.5* 36.2* 30.8* 31.1* 30.9*  MCV 94.3 96.5 95.7 95.7 95.7  PLT 254 335 301 302 401   Basic Metabolic Panel:  Recent Labs Lab 08/08/16 0559 08/09/16 0554 08/11/16 1348 08/13/16 0515 08/14/16 0507  NA 139 139 140 141 139  K 3.9 3.8 3.7 4.2 4.6  CL 107 107 107 112* 112*  CO2 '26 25 24 22 22  '$ GLUCOSE 133* 186* 116* 134* 184*  BUN 44* 44* 45* 40* 31*  CREATININE 2.41* 2.22* 2.22* 2.05* 1.75*  CALCIUM 8.7* 8.9 9.2 8.8* 8.4*   GFR: Estimated Creatinine Clearance: 38 mL/min (by C-G formula based on SCr of 1.75 mg/dL (H)). Liver Function Tests:  Recent Labs Lab 08/11/16 1348  AST 22  ALT 23  ALKPHOS 75  BILITOT 0.5  PROT 7.4  ALBUMIN 3.5   No results for input(s): LIPASE, AMYLASE in the last 168 hours. No results for input(s): AMMONIA in the last 168 hours. Coagulation Profile: No results for input(s): INR, PROTIME in the last 168 hours. Cardiac Enzymes: No results for input(s): CKTOTAL, CKMB, CKMBINDEX, TROPONINI in the last 168  hours. BNP (last 3 results) No results for input(s): PROBNP in the last 8760 hours. HbA1C: No results for input(s): HGBA1C in the last 72 hours. CBG:  Recent Labs Lab 08/13/16 1134 08/13/16 1836 08/13/16 2030 08/14/16 0759 08/14/16 1140  GLUCAP 220* 96 111* 141* 189*   Lipid Profile: No results for input(s): CHOL, HDL, LDLCALC, TRIG, CHOLHDL, LDLDIRECT in the last 72 hours. Thyroid Function Tests: No results for input(s): TSH, T4TOTAL, FREET4, T3FREE, THYROIDAB in the last 72 hours. Anemia Panel: No results for input(s): VITAMINB12, FOLATE, FERRITIN, TIBC, IRON, RETICCTPCT in the last 72 hours. Urine analysis:    Component Value Date/Time   COLORURINE YELLOW 07/19/2015 0829   APPEARANCEUR CLEAR 07/19/2015 0829   LABSPEC 1.015 07/19/2015 0829   PHURINE 6.0 07/19/2015 0829   GLUCOSEU NEGATIVE 07/19/2015 0829   HGBUR NEGATIVE 07/19/2015 0829   BILIRUBINUR NEGATIVE 07/19/2015 0829   KETONESUR NEGATIVE 07/19/2015 0829   PROTEINUR 100 (A) 06/20/2012 0603   UROBILINOGEN 0.2 07/19/2015 0829   NITRITE NEGATIVE 07/19/2015 0829   LEUKOCYTESUR NEGATIVE 07/19/2015 0829   Recent Results (from the past 240 hour(s))  Culture, blood (routine x 2)     Status: None   Collection Time: 08/07/16 11:15 AM  Result Value Ref Range  Status   Specimen Description BLOOD LEFT ANTECUBITAL  Final   Special Requests BOTTLES DRAWN AEROBIC AND ANAEROBIC 5CC  Final   Culture   Final    NO GROWTH 5 DAYS Performed at [facility], [place] [address] [place], [place] [address]    Report Status 08/12/2016 FINAL  Final  Culture, blood (routine x 2)     Status: None   Collection Time: 08/07/16 11:19 AM  Result Value Ref Range Status   Specimen Description BLOOD LEFT WRIST  Final   Special Requests BOTTLES DRAWN AEROBIC ONLY 5ML  Final   Culture   Final    NO GROWTH 5 DAYS Performed at [facility], [place] [address] [place], [place] [address]    Report Status 08/12/2016 FINAL  Final  MRSA PCR  Screening     Status: None   Collection Time: 08/07/16 10:08 PM  Result Value Ref Range Status   MRSA by PCR NEGATIVE NEGATIVE Final    Comment:        The GeneXpert MRSA Assay (FDA approved for NASAL specimens only), is one component of a comprehensive MRSA colonization surveillance program. It is not intended to diagnose MRSA infection nor to guide or monitor treatment for MRSA infections.   Culture, blood (routine x 2)     Status: None (Preliminary result)   Collection Time: 08/11/16  1:50 PM  Result Value Ref Range Status   Specimen Description BLOOD LEFT ANTECUBITAL  Final   Special Requests NONE  Final   Culture   Final    NO GROWTH 3 DAYS Performed at [facility], [place] [address] [place], [place] [address]    Report Status PENDING  Incomplete  Culture, blood (routine x 2)     Status: None (Preliminary result)   Collection Time: 08/11/16  2:00 PM  Result Value Ref Range Status   Specimen Description BLOOD LEFT HAND  Final   Special Requests NONE  Final   Culture   Final    NO GROWTH 3 DAYS Performed at [facility], [place] [address] [place], [place] [address]    Report Status PENDING  Incomplete  Surgical PCR screen     Status: None   Collection Time: 08/13/16  1:53 PM  Result Value Ref Range Status   MRSA, PCR NEGATIVE NEGATIVE Final   Staphylococcus aureus NEGATIVE NEGATIVE Final    Comment:        The Xpert SA Assay (FDA approved for NASAL specimens in patients over 76 years of age), is one component of a comprehensive surveillance program.  Test performance has been validated by [facility] for patients greater than or equal to 63 year old. It is not intended to diagnose infection nor to guide or monitor treatment.       Radiology Studies: No results found.  Scheduled Meds: . albuterol  2.5 mg Nebulization BID  . [START ON 08/15/2016] enoxaparin (LOVENOX) injection  40 mg Subcutaneous Q24H  . famotidine  20 mg Oral QHS  . ferrous  sulfate  325 mg Oral Q breakfast  . fesoterodine  8 mg Oral QHS  . finasteride  5 mg Oral QHS  . furosemide  20 mg Oral Once per day on Sun Tue Thu Sat  . insulin aspart  0-20 Units Subcutaneous TID WC  . insulin aspart  0-5 Units Subcutaneous QHS  . insulin glargine  20 Units Subcutaneous BID  . mometasone-formoterol  2 puff Inhalation BID  .  montelukast  10 mg Oral QHS  . multivitamin with minerals  1 tablet Oral Daily  . pantoprazole  40 mg Oral Daily  . psyllium  1 packet Oral Daily  . saccharomyces boulardii  250 mg Oral BID  . simvastatin  20 mg Oral QHS  . vancomycin  1,500 mg Intravenous Q48H   Continuous Infusions: . sodium chloride 75 mL/hr at 08/14/16 0834     LOS: 3 days   Time spent: 25 minutes.  Vance Gather, MD [facility] Pager [phone]  If 7PM-7AM, please contact night-coverage [email or web address] Password [facility] 08/14/2016, 12:14 PM

## 2016-08-14 NOTE — Evaluation (Signed)
Physical Therapy Evaluation Patient Details Name: Clayton [name] MRN: [id number] DOB: [birth year] Today's Date: 08/14/2016   History of Present Illness  Clayton [name] a 83 y.o.malewith medical history of HTN, CKD stage IV, and IDDM  recently discharged from [facility] after a 2 day hospitalization for right hand cellulitis following a right hand wound that required debridement. He was discharged on doxycycline after improvement on IV vancomycin. Though he took doxycycline without emesis, the family noted worsening redness and that the wound began looking worse after discharge. On arrival he was afebrile. WBC and lactate were normal. Hand XR showed soft tissue swelling with no evidence of osteomyelitis. Because of the worsening erythema he was given IV vancomycin and admitted for failure of outpatient management of cellulitis. Hand surgery was consulted and took the patient to the OR for debridement 2/26. ID was consulted and has guided antibiotic recommendations.   Clinical Impression  Pt admitted with above diagnosis. Pt currently with functional limitations due to the deficits listed below (see PT Problem List). Mod assist for sit to stand, pt ambulated 66' with RW with min/guard assist. Will trial R platform RW next session.  Pt will benefit from skilled PT to increase their independence and safety with mobility to allow discharge to the venue listed below.       Follow Up Recommendations Home health PT    Equipment Recommendations  Rolling walker with 5" wheels;Other (comment) (R platform for RW)    Recommendations for Other Services OT consult     Precautions / Restrictions Precautions Precautions: Fall Restrictions Weight Bearing Restrictions: No      Mobility  Bed Mobility               General bed mobility comments: up in recliner  Transfers Overall transfer level: Needs assistance Equipment used: Rolling walker (2 wheeled) Transfers: Sit to/from Stand Sit to Stand:  Mod assist         General transfer comment: mod A to rise, VCs hand placement  Ambulation/Gait Ambulation/Gait assistance: Min guard Ambulation Distance (Feet): 80 Feet Assistive device: Rolling walker (2 wheeled) Gait Pattern/deviations: Trunk flexed;Step-through pattern   Gait velocity interpretation: Below normal speed for age/gender General Gait Details: pt held onto RW with LUE, he declined trial of cane or holding IV pole, VCs for positioning in RW, will try PFRW next visit  Stairs            Wheelchair Mobility    Modified Rankin (Stroke Patients Only)       Balance Overall balance assessment: History of Falls (fell ~9 days ago)                                           Pertinent Vitals/Pain Pain Assessment: No/denies pain Pain Score: 3  Pain Location: R  fingers with ROM Pain Descriptors / Indicators: Tender;Sore Pain Intervention(s): Monitored during session;Repositioned;Limited activity within patient's tolerance    Home Living Family/patient expects to be discharged to:: Private residence Living Arrangements: Children Available Help at Discharge: Family;Available 24 hours/day Type of Home: House Home Access: Ramped entrance     Home Layout: One level Home Equipment: Cane - single point;Grab bars - toilet Additional Comments: daughter works    Prior Function Level of Independence: Independent with assistive device(s)         Comments: walks with [name]  Extremity/Trunk Assessment   Upper Extremity Assessment Upper Extremity Assessment: Defer to OT evaluation    Lower Extremity Assessment Lower Extremity Assessment: Overall WFL for tasks assessed    Cervical / Trunk Assessment Cervical / Trunk Assessment: Normal  Communication   Communication: No difficulties  Cognition Arousal/Alertness: Awake/alert Behavior During Therapy: WFL for tasks assessed/performed Overall Cognitive Status:  Within Functional Limits for tasks assessed                      General Comments      Exercises     Assessment/Plan    PT Assessment Patient needs continued PT services  PT Problem List Decreased balance;Decreased mobility       PT Treatment Interventions Gait training;DME instruction;Functional mobility training;Therapeutic activities;Therapeutic exercise    PT Goals (Current goals can be found in the Care Plan section)  Acute Rehab PT Goals Patient Stated Goal: return home PT Goal Formulation: With patient Time For Goal Achievement: 08/28/16 Potential to Achieve Goals: Good    Frequency Min 3X/week   Barriers to discharge        Co-evaluation               End of Session Equipment Utilized During Treatment: Gait belt Activity Tolerance: Patient tolerated treatment well Patient left: in chair;with call bell/phone within reach;with chair alarm set Nurse Communication: Mobility status PT Visit Diagnosis: Unsteadiness on feet (R26.81);History of falling (Z91.81);Difficulty in walking, not elsewhere classified (R26.2)         Time: 2060-1561 PT Time Calculation (min) (ACUTE ONLY): 26 min   Charges:   PT Evaluation $PT Eval Low Complexity: 1 Procedure     PT G Codes:         [name] 08/14/2016, 2:46 PM [phone]

## 2016-08-14 NOTE — Progress Notes (Signed)
Wound care orders left to begin tomorrow at 0800.  Will plan to view wounds tomorrow if patient remains hospitalized.

## 2016-08-15 LAB — CBC
HCT: 32.2 % — ABNORMAL LOW (ref 39.0–52.0)
Hemoglobin: 10.5 g/dL — ABNORMAL LOW (ref 13.0–17.0)
MCH: 31.3 pg (ref 26.0–34.0)
MCHC: 32.6 g/dL (ref 30.0–36.0)
MCV: 95.8 fL (ref 78.0–100.0)
PLATELETS: 314 10*3/uL (ref 150–400)
RBC: 3.36 MIL/uL — AB (ref 4.22–5.81)
RDW: 13.1 % (ref 11.5–15.5)
WBC: 11.5 10*3/uL — ABNORMAL HIGH (ref 4.0–10.5)

## 2016-08-15 LAB — BASIC METABOLIC PANEL
Anion gap: 6 (ref 5–15)
BUN: 33 mg/dL — AB (ref 6–20)
CO2: 21 mmol/L — ABNORMAL LOW (ref 22–32)
Calcium: 8.4 mg/dL — ABNORMAL LOW (ref 8.9–10.3)
Chloride: 113 mmol/L — ABNORMAL HIGH (ref 101–111)
Creatinine, Ser: 2.07 mg/dL — ABNORMAL HIGH (ref 0.61–1.24)
GFR calc Af Amer: 32 mL/min — ABNORMAL LOW (ref >60)
GFR, EST NON AFRICAN AMERICAN: 27 mL/min — AB (ref >60)
GLUCOSE: 142 mg/dL — AB (ref 65–99)
POTASSIUM: 4.2 mmol/L (ref 3.5–5.1)
Sodium: 140 mmol/L (ref 135–145)

## 2016-08-15 LAB — GLUCOSE, CAPILLARY
GLUCOSE-CAPILLARY: 139 mg/dL — AB (ref 65–99)
GLUCOSE-CAPILLARY: 173 mg/dL — AB (ref 65–99)
Glucose-Capillary: 202 mg/dL — ABNORMAL HIGH (ref 65–99)
Glucose-Capillary: 145 mg/dL — ABNORMAL HIGH (ref 65–99)

## 2016-08-15 MED ORDER — POLYETHYLENE GLYCOL 3350 17 G PO PACK
17.0000 g | PACK | Freq: Two times a day (BID) | ORAL | Status: DC
  Administered 2016-08-15 – 2016-08-16 (×3): 17 g via ORAL
  Filled 2016-08-15 (×2): qty 1

## 2016-08-15 MED ORDER — BISACODYL 10 MG RE SUPP: 10.0000 mg | Freq: Every day | RECTAL | Status: DC | PRN

## 2016-08-15 NOTE — Discharge Instructions (Signed)
DAILY:  Cleanse the right hand open wounds with soap and a washrag under running water in the sink or shower, then redress as follows: Small dab of bacitracin ointment on the raw open area to keep if from drying out.  Cover that area with xerform for same purpose, then very light dry gauze to keep the ointment/xeroform in place.  Use dry gauze to keep the skin of the 4th web space dry.  Right upper forearm wounds: Dry dressings changed daily until no more drainage, then may stop dressings altogether for upper forearm wounds.

## 2016-08-15 NOTE — Progress Notes (Signed)
Occupational Therapy Treatment Patient Details Name: Clayton [name] MRN: [id number] DOB: [birth year] Today's Date: 08/15/2016    History of present illness Clayton [name] a 83 y.o.malewith medical history of HTN, CKD stage IV, and IDDM  recently discharged from [facility] after a 2 day hospitalization for right hand cellulitis following a right hand wound that required debridement. He was discharged on doxycycline after improvement on IV vancomycin. Though he took doxycycline without emesis, the family noted worsening redness and that the wound began looking worse after discharge. On arrival he was afebrile. WBC and lactate were normal. Hand XR showed soft tissue swelling with no evidence of osteomyelitis. Because of the worsening erythema he was given IV vancomycin and admitted for failure of outpatient management of cellulitis. Hand surgery was consulted and took the patient to the OR for debridement 2/26. ID was consulted and has guided antibiotic recommendations.    OT comments  Pt with increased ROM in R fingers this day except 5th digit  Follow Up Recommendations  Outpatient OT;Home health OT;Supervision/Assistance - 24 hour;Other (comment) (depending on progress and MD preference)    Equipment Recommendations  3 in 1 bedside commode    Recommendations for Other Services      Precautions / Restrictions Precautions Precautions: Fall Precaution Comments: NWB hand. Wound L hand- wrapped.  wound L elbow- also with a bandage Restrictions Weight Bearing Restrictions: No       Mobility Bed Mobility               General bed mobility comments: declined  Transfers                 General transfer comment: declined    Balance                                   ADL Overall ADL's : Needs assistance/impaired                                       General ADL Comments: Pt declined OOB at this time, but did agree to finger, hand, elbow and  shoulder ROM for edema control and joint mobility.  Pt with least mobility in pinky finger, but with increased movement in other digits.  OT did share with pt he may need to see hand therapist upon DC to increase ROM and function R hand                Cognition   Behavior During Therapy: [facility] for tasks assessed/performed Overall Cognitive Status: Within Functional Limits for tasks assessed                         Exercises Shoulder Exercises Shoulder Flexion: AROM;Right;10 reps Elbow Flexion: AROM;Right;10 reps Elbow Extension: AROM;10 reps;Right Wrist Flexion: Other (comment);AROM;5 reps (limited movement) Wrist Extension: AROM;5 reps (limited movement) Digit Composite Flexion: AAROM;Right;5 reps;Limitations Composite Flexion Limitations: very painful 5th digit. Pt with more ROM in other fingers. Educated pt to work with fingers to increase ROM using other hand Composite Extension: AAROM;Limitations;5 reps;Right Composite Extension Limitations: very painful 5th digit. Pt with more ROM in other fingers. Educated pt to work with fingers to increase ROM using other hand   Shoulder Instructions       General Comments      Pertinent Vitals/  Pain       Pain Score: 5  Pain Location: R 5th digit with ROM Pain Descriptors / Indicators: Tender;Sore Pain Intervention(s): Monitored during session;Repositioned;Limited activity within patient's tolerance  Home Living                                              Frequency           Progress Toward Goals  OT Goals(current goals can now be found in the care plan section)  Progress towards OT goals: Progressing toward goals  ADL Goals Pt Will Perform Grooming: with modified independence;standing Pt Will Perform Lower Body Dressing: with modified independence;sit to/from stand Pt Will Transfer to Toilet: with modified independence;ambulating Pt Will Perform Toileting - Clothing Manipulation and  hygiene: with modified independence;sit to/from stand Pt/caregiver will Perform Home Exercise Program: Right Upper extremity;Independently;Increased ROM  Plan Discharge plan needs to be updated    Co-evaluation                 End of Session    OT Visit Diagnosis: Unsteadiness on feet (R26.81);Pain   Activity Tolerance Patient tolerated treatment well   Patient Left in bed;with call bell/phone within reach   Nurse Communication Mobility status        Time: 1207-1225 OT Time Calculation (min): 18 min  Charges: OT Treatments $Therapeutic Exercise: 8-22 mins  Kari Baars, [place]   [name] 08/15/2016, 2:25 PM

## 2016-08-15 NOTE — Progress Notes (Signed)
Arrived to view wounds.  Wound care ordered to begin at 0800 today not yet done. I went ahead and pulled the packings Still with significant redness about the distal wounds especially, remains tender.  Seems improved from the presentation over the weekend.  He has a fairly large hand wound that may successfully granulate and close secondarily, but might need a procedure for coverage, once the infection has sufficiently resolved. Continue wound care as ordered.  I will plan to f/u on the progress of his functional recovery and wound closure in 7-10 days.  He may benefit from outpatient or home hand therapy.  Medical infection management per ID.  May d/c from hospital when thought appropriate by ID.  Micheline Rough, MD Hand Surgery Office [phone]

## 2016-08-15 NOTE — Progress Notes (Signed)
PT Cancellation Note  Patient Details Name: Clayton [name] MRN: [id number] DOB: [birth year]   Cancelled Treatment:    Reason Eval/Treat Not Completed: Fatigue/lethargy limiting ability to participate (pt resting in bed, stated he doesn't want to get up right now 2* fatigue. Platform RW placed in pts room for him to try. )   [name] 08/15/2016, 10:25 AM [phone]

## 2016-08-15 NOTE — Evaluation (Signed)
Occupational Therapy Evaluation- Late entry from 08/15/2016 Patient Details Name: Clayton [name] MRN: [id number] DOB: 05-17-1928 Today's Date: 08/15/2016    History of Present Illness Clayton [name] a 83 y.o.malewith medical history of HTN, CKD stage IV, and IDDM  recently discharged from [facility] after a 2 day hospitalization for right hand cellulitis following a right hand wound that required debridement. He was discharged on doxycycline after improvement on IV vancomycin. Though he took doxycycline without emesis, the family noted worsening redness and that the wound began looking worse after discharge. On arrival he was afebrile. WBC and lactate were normal. Hand XR showed soft tissue swelling with no evidence of osteomyelitis. Because of the worsening erythema he was given IV vancomycin and admitted for failure of outpatient management of cellulitis. Hand surgery was consulted and took the patient to the OR for debridement 2/26. ID was consulted and has guided antibiotic recommendations.    Clinical Impression   Pt admitted with R hand / arm cellulitis. Pt currently with functional limitations due to the deficits listed below (see OT Problem List). Pt will benefit from skilled OT to increase their safety and independence with ADL and functional mobility for ADL to facilitate discharge to venue listed below.     Follow Up Recommendations  Supervision/Assistance - 24 hour;Home health OT    Equipment Recommendations  3 in 1 bedside commode    Recommendations for Other Services       Precautions / Restrictions Precautions Precautions: Fall Restrictions Weight Bearing Restrictions: No      Mobility Bed Mobility               General bed mobility comments: sitting EOB with CNA  Transfers Overall transfer level: Needs assistance Equipment used: Rolling walker (2 wheeled) Transfers: Sit to/from Stand Sit to Stand: Mod assist         General transfer comment: mod A to  rise, VCs hand placement         ADL Overall ADL's : Needs assistance/impaired                     Lower Body Dressing: Maximal assistance;Sit to/from stand;Adhering to hip precautions;Cueing for sequencing   Toilet Transfer: Maximal assistance Toilet Transfer Details (indicate cue type and reason): sit to stand unrinal Toileting- Clothing Manipulation and Hygiene: Sit to/from stand;Maximal assistance                   Hand Dominance  right   Extremity/Trunk Assessment Upper Extremity Assessment Upper Extremity Assessment: RUE deficits/detail RUE Deficits / Details: Pt with ROM deficits in R wrist as well as fingers due to splint as well as edema.  Educated on elevation as well as ROM as able of fingers           Communication Communication Communication: No difficulties   Cognition Arousal/Alertness: Awake/alert Behavior During Therapy: WFL for tasks assessed/performed Overall Cognitive Status: Within Functional Limits for tasks assessed                      RUE  Exercises   AROM - shoulder and elbow  AAROM fingers        Home Living Family/patient expects to be discharged to:: Private residence Living Arrangements: Children Available Help at Discharge: Family;Available 24 hours/day Type of Home: House Home Access: Ramped entrance     Home Layout: One level     Bathroom Shower/Tub: Teacher, early years/pre: Handicapped height  Home Equipment: Cane - single point;Grab bars - toilet   Additional Comments: daughter works      Prior Functioning/Environment Level of Independence: Independent with assistive device(s)        Comments: walks with SPC        OT Problem List: Decreased strength;Decreased range of motion;Decreased coordination;Decreased knowledge of use of DME or AE;Decreased safety awareness;Impaired balance (sitting and/or standing);Impaired UE functional use      OT Treatment/Interventions:  Self-care/ADL training;DME and/or AE instruction;Patient/family education;Therapeutic activities;Therapeutic exercise    OT Goals(Current goals can be found in the care plan section) Acute Rehab OT Goals Patient Stated Goal: return home OT Goal Formulation: With patient Time For Goal Achievement: 08/22/16 Potential to Achieve Goals: Good  OT Frequency: Min 3X/week   Barriers to D/C:               End of Session Nurse Communication: Mobility status;Other (comment) (need for elevation of RUE)  Activity Tolerance: Patient tolerated treatment well Patient left: in chair;with call bell/phone within reach;with nursing/sitter in room  OT Visit Diagnosis: Unsteadiness on feet (R26.81);Pain                ADL either performed or assessed with clinical judgement  T  Charges:   2/27 [id number]   mod eval  Kari Baars, OT [phone]  [name] 08/15/2016, 8:22 AM

## 2016-08-15 NOTE — Progress Notes (Signed)
PROGRESS NOTE  Clayton LEHENBAUER  YSA:[id number] DOB: September 27, 1927 DOA: 08/11/2016 PCP: Lujean Amel, MD   Brief Narrative: Clayton [name] a 83 y.o.malewith medical history of HTN, CKD stage IV, and IDDM  recently discharged from [facility] after a 2 day hospitalization for right hand cellulitis following a right hand wound that required debridement. He was discharged on doxycycline after improvement on IV vancomycin. Though he took doxycycline without emesis, the family noted worsening redness and that the wound began looking worse after discharge. On arrival he was afebrile. WBC and lactate were normal. Hand XR showed soft tissue swelling with no evidence of osteomyelitis. Because of the worsening erythema he was given IV vancomycin and admitted for failure of outpatient management of cellulitis. Hand surgery was consulted and took the patient to the OR for debridement 2/26. ID was consulted and has guided antibiotic recommendations.   Assessment & Plan: Principal Problem:   Cellulitis of right hand Active Problems:   Essential hypertension   Sleep apnea   Diabetes mellitus (HCC)   BPH (benign prostatic hyperplasia)   Type 2 diabetes mellitus with hyperglycemia, with long-term current use of insulin (HCC)   Chronic kidney disease (CKD), stage IV (severe) (HCC)  Cellulitis of right hand: s/p operative debridement 2/26 by hand surgery, Dr. Grandville Silos. Had been recently admitted and was s/p I&D 2/20, improved on vanc, but worsened when transitioned to doxycycline. Blood cultures from recent admission are negative. WBC and lactate normal on admission. MRI was performed last admission and showed no evidence of dep infection, tendonitis, myositis, or osteomyelitis.  - Continue IV vancomycin, pharmacy to dose for renal function.  -Appreciated Dr Megan Salon and Dr Grandville Silos help.  -Started on Levaquin and flagyl orally, continue with IV vancomycin.  -discharge when ok by ID/  - No purulence found  intraoperatively, so no cultures obtained.  Constipation; no BM in a week.  I have order laxative and suppository   Chronic bronchitis: No respiratory distress or hypoxemia. He has very coarse breath sounds which are his baseline. - Continue symbicort, bronchodilators.  -Incentive spirometry.   IDT2DM: Most recent HbA1c 7.0% without reported hypoglycemia. DM + age are risk factors for poor wound healing.  -Lantus 20 units BID.  -SSI./   CKD stage IV: Cr remains at baseline 2.22 on admission; baseline 2.3-2.6 with GFR consistently in 20's.  - Monitor on vancomycin -  hold lasix , mildly increase cr.   Hyperlipidemia - Continue Zocor  Lower extremity edema: Stable. - hold lasix  -NSL   Insomnia: Chronic. Takes melatonin at home, but can't get that here. - Ordered trazodone '50mg'$  qHS prn     DVT prophylaxis: Lovenox Code Status: DNR Family Communication: None at bedside this morning Disposition Plan: Continue inpatient management of cellulitis s/p debridement 2/26.  Consultants:   Hand surgery, Dr. Grandville Silos  ID, Dr. Megan Salon  Procedures:   None  Antimicrobials:  Vancomycin 2/24 >>    Subjective: He denies worsening hand pain.  No BM in a week. Has pass gas.  Denies dyspnea. He has chronic cough, not worse.   Objective: BP (!) 117/44 (BP Location: Left Arm)   Pulse 78   Temp 98.2 F (36.8 C) (Oral)   Resp 18   Ht 5' 10.5" (1.791 m)   Wt 106.6 kg (235 lb 0.2 oz)   SpO2 98%   BMI 33.24 kg/m    General exam: Pleasant elderly male in no distress Respiratory system: Non-labored breathing on room air. Bilateral ronchus.  Cardiovascular system:  Regular rate and rhythm. No murmur, rub, or gallop. No JVD, and no pedal edema. Gastrointestinal system: Abdomen soft, non-tender, non-distended, with normoactive bowel sounds. No organomegaly or masses felt. Central nervous system: Alert and oriented. No focal neurological deficits. Extremities: Warm, no  deformities Skin: Right hand with clean dressing on.    Data Reviewed: I have personally reviewed following labs and imaging studies  CBC:  Recent Labs Lab 08/11/16 1348 08/12/16 0518 08/13/16 0515 08/14/16 0507 08/15/16 0551  WBC 12.2* 10.2 11.2* 13.6* 11.5*  NEUTROABS 9.2*  --   --   --   --   HGB 11.7* 10.0* 10.1* 10.1* 10.5*  HCT 36.2* 30.8* 31.1* 30.9* 32.2*  MCV 96.5 95.7 95.7 95.7 95.8  PLT 335 301 302 308 175   Basic Metabolic Panel:  Recent Labs Lab 08/09/16 0554 08/11/16 1348 08/13/16 0515 08/14/16 0507 08/15/16 0551  NA 139 140 141 139 140  K 3.8 3.7 4.2 4.6 4.2  CL 107 107 112* 112* 113*  CO2 '25 24 22 22 '$ 21*  GLUCOSE 186* 116* 134* 184* 142*  BUN 44* 45* 40* 31* 33*  CREATININE 2.22* 2.22* 2.05* 1.75* 2.07*  CALCIUM 8.9 9.2 8.8* 8.4* 8.4*   GFR: Estimated Creatinine Clearance: 31.6 mL/min (by C-G formula based on SCr of 2.07 mg/dL (H)). Liver Function Tests:  Recent Labs Lab 08/11/16 1348  AST 22  ALT 23  ALKPHOS 75  BILITOT 0.5  PROT 7.4  ALBUMIN 3.5   No results for input(s): LIPASE, AMYLASE in the last 168 hours. No results for input(s): AMMONIA in the last 168 hours. Coagulation Profile: No results for input(s): INR, PROTIME in the last 168 hours. Cardiac Enzymes: No results for input(s): CKTOTAL, CKMB, CKMBINDEX, TROPONINI in the last 168 hours. BNP (last 3 results) No results for input(s): PROBNP in the last 8760 hours. HbA1C: No results for input(s): HGBA1C in the last 72 hours. CBG:  Recent Labs Lab 08/14/16 1140 08/14/16 1709 08/14/16 2137 08/15/16 0746 08/15/16 1132  GLUCAP 189* 171* 182* 139* 202*   Lipid Profile: No results for input(s): CHOL, HDL, LDLCALC, TRIG, CHOLHDL, LDLDIRECT in the last 72 hours. Thyroid Function Tests: No results for input(s): TSH, T4TOTAL, FREET4, T3FREE, THYROIDAB in the last 72 hours. Anemia Panel: No results for input(s): VITAMINB12, FOLATE, FERRITIN, TIBC, IRON, RETICCTPCT in the last  72 hours. Urine analysis:    Component Value Date/Time   COLORURINE YELLOW 07/19/2015 0829   APPEARANCEUR CLEAR 07/19/2015 0829   LABSPEC 1.015 07/19/2015 0829   PHURINE 6.0 07/19/2015 0829   GLUCOSEU NEGATIVE 07/19/2015 0829   HGBUR NEGATIVE 07/19/2015 0829   BILIRUBINUR NEGATIVE 07/19/2015 0829   KETONESUR NEGATIVE 07/19/2015 0829   PROTEINUR 100 (A) 06/20/2012 0603   UROBILINOGEN 0.2 07/19/2015 0829   NITRITE NEGATIVE 07/19/2015 0829   LEUKOCYTESUR NEGATIVE 07/19/2015 0829   Recent Results (from the past 240 hour(s))  Culture, blood (routine x 2)     Status: None   Collection Time: 08/07/16 11:15 AM  Result Value Ref Range Status   Specimen Description BLOOD LEFT ANTECUBITAL  Final   Special Requests BOTTLES DRAWN AEROBIC AND ANAEROBIC 5CC  Final   Culture   Final    NO GROWTH 5 DAYS Performed at [facility], [place] [address], [place], [place] [address]    Report Status 08/12/2016 FINAL  Final  Culture, blood (routine x 2)     Status: None   Collection Time: 08/07/16 11:19 AM  Result Value Ref Range Status  Specimen Description BLOOD LEFT WRIST  Final   Special Requests BOTTLES DRAWN AEROBIC ONLY 5ML  Final   Culture   Final    NO GROWTH 5 DAYS Performed at [facility], [address], [place], [place] [address]    Report Status 08/12/2016 FINAL  Final  MRSA PCR Screening     Status: None   Collection Time: 08/07/16 10:08 PM  Result Value Ref Range Status   MRSA by PCR NEGATIVE NEGATIVE Final    Comment:        The GeneXpert MRSA Assay (FDA approved for NASAL specimens only), is one component of a comprehensive MRSA colonization surveillance program. It is not intended to diagnose MRSA infection nor to guide or monitor treatment for MRSA infections.   Culture, blood (routine x 2)     Status: None (Preliminary result)   Collection Time: 08/11/16  1:50 PM  Result Value Ref Range Status   Specimen Description BLOOD LEFT ANTECUBITAL  Final    Special Requests NONE  Final   Culture   Final    NO GROWTH 3 DAYS Performed at [facility], [place] [address] [place], [place] [address]    Report Status PENDING  Incomplete  Culture, blood (routine x 2)     Status: None (Preliminary result)   Collection Time: 08/11/16  2:00 PM  Result Value Ref Range Status   Specimen Description BLOOD LEFT HAND  Final   Special Requests NONE  Final   Culture   Final    NO GROWTH 3 DAYS Performed at [facility], [place] [address] [place], [name] [address]    Report Status PENDING  Incomplete  Surgical PCR screen     Status: None   Collection Time: 08/13/16  1:53 PM  Result Value Ref Range Status   MRSA, PCR NEGATIVE NEGATIVE Final   Staphylococcus aureus NEGATIVE NEGATIVE Final    Comment:        The Xpert SA Assay (FDA approved for NASAL specimens in patients over 6 years of age), is one component of a comprehensive surveillance program.  Test performance has been validated by [facility] for patients greater than or equal to 91 year old. It is not intended to diagnose infection nor to guide or monitor treatment.       Radiology Studies: No results found.  Scheduled Meds: . albuterol  2.5 mg Nebulization BID  . bacitracin   Topical Daily  . enoxaparin (LOVENOX) injection  40 mg Subcutaneous Q24H  . famotidine  20 mg Oral QHS  . ferrous sulfate  325 mg Oral Q breakfast  . fesoterodine  8 mg Oral QHS  . finasteride  5 mg Oral QHS  . furosemide  20 mg Oral Once per day on Sun Tue Thu Sat  . insulin aspart  0-20 Units Subcutaneous TID WC  . insulin aspart  0-5 Units Subcutaneous QHS  . insulin glargine  20 Units Subcutaneous BID  . levofloxacin  250 mg Oral Q1500  . metroNIDAZOLE  500 mg Oral Q8H  . mometasone-formoterol  2 puff Inhalation BID  . montelukast  10 mg Oral QHS  . multivitamin with minerals  1 tablet Oral Daily  . pantoprazole  40 mg Oral Daily  . polyethylene glycol  17 g Oral BID  . psyllium  1  packet Oral Daily  . saccharomyces boulardii  250 mg Oral BID  . simvastatin  20 mg Oral QHS  . vancomycin  1,500  mg Intravenous Q48H   Continuous Infusions: . sodium chloride 75 mL/hr at 08/15/16 1202     LOS: 4 days   Time spent: 25 minutes.  Elmarie Shiley, MD [facility] Pager [phone]  If 7PM-7AM, please contact night-coverage [email or web address] Password [facility] 08/15/2016, 3:13 PM

## 2016-08-15 NOTE — Progress Notes (Signed)
Patient ID: Clayton [name], male   DOB: [birth year], 83 y.o.   MRN: [id number]          [facility] Infectious Disease  Date of Admission:  08/11/2016   Total days of antibiotics 9        Day 5 vancomycin        Day 2 levofloxacin and metronidazole         Principal Problem:   Cellulitis of right hand Active Problems:   Essential hypertension   Sleep apnea   Diabetes mellitus (HCC)   BPH (benign prostatic hyperplasia)   Type 2 diabetes mellitus with hyperglycemia, with long-term current use of insulin (HCC)   Chronic kidney disease (CKD), stage IV (severe) ([place])   . albuterol  2.5 mg Nebulization BID  . bacitracin   Topical Daily  . enoxaparin (LOVENOX) injection  40 mg Subcutaneous Q24H  . famotidine  20 mg Oral QHS  . ferrous sulfate  325 mg Oral Q breakfast  . fesoterodine  8 mg Oral QHS  . finasteride  5 mg Oral QHS  . insulin aspart  0-20 Units Subcutaneous TID WC  . insulin aspart  0-5 Units Subcutaneous QHS  . insulin glargine  20 Units Subcutaneous BID  . levofloxacin  250 mg Oral Q1500  . metroNIDAZOLE  500 mg Oral Q8H  . mometasone-formoterol  2 puff Inhalation BID  . montelukast  10 mg Oral QHS  . multivitamin with minerals  1 tablet Oral Daily  . pantoprazole  40 mg Oral Daily  . polyethylene glycol  17 g Oral BID  . psyllium  1 packet Oral Daily  . saccharomyces boulardii  250 mg Oral BID  . simvastatin  20 mg Oral QHS  . vancomycin  1,500 mg Intravenous Q48H    SUBJECTIVE: He has been walking up in the hall today. He states that his right hand pain is minimal. He is tolerating his antibiotics well.   Review of Systems: Review of Systems  Constitutional: Negative for chills, diaphoresis and fever.  Gastrointestinal: Negative for abdominal pain, diarrhea, nausea and vomiting.  Musculoskeletal: Positive for joint pain.    Past Medical History:  Diagnosis Date  . Anemia   . Chronic renal insufficiency   . DDD (degenerative disc disease),  lumbar   . Diabetes mellitus   . Hypertension   . Neuromuscular disorder ([place])   . Osteoporosis     Social History  Substance Use Topics  . Smoking status: Former Smoker    Packs/day: 3.00    Years: 33.00    Types: Cigarettes    Quit date: 06/19/1973  . Smokeless tobacco: Former Systems developer    Quit date: 09/26/1975  . Alcohol use No    Family History  Problem Relation Age of Onset  . Diabetes Mellitus II    . Hypertension     Allergies  Allergen Reactions  . Nitrous Oxide Other (See Comments)    Reaction:  Unknown   . Penicillins Other (See Comments)    Reaction:  Unknown Has patient had a PCN reaction causing immediate rash, facial/tongue/throat swelling, SOB or lightheadedness with hypotension: Unsure Has patient had a PCN reaction causing severe rash involving mucus membranes or skin necrosis: Unsure Has patient had a PCN reaction that required hospitalization Unsure  Has patient had a PCN reaction occurring within the last 10 years: No If all of the above answers are "NO", then may proceed with Cephalosporin use.  . Tape Other (See Comments)  Reaction:  Tears pts skin   . Diltiazem Rash    OBJECTIVE: Vitals:   08/15/16 0542 08/15/16 0730 08/15/16 1320 08/15/16 1451  BP: 136/64   (!) 117/44  Pulse: 85 73  78  Resp: '20 17  18  '$ Temp: 97.9 F (36.6 C)   98.2 F (36.8 C)  TempSrc: Oral   Oral  SpO2: 97% 96%  98%  Weight:   235 lb 0.2 oz (106.6 kg)   Height:       Body mass index is 33.24 kg/m.  Physical Exam  Constitutional: He is oriented to person, place, and time.  He is sitting up in a chair. He is in good spirits. 2 of his daughters are at the bedside.  Musculoskeletal:  His right hand is in a clean dry gauze dressing. One of his daughters has pictures of his wound taken during dressing change this morning as well as serial images dating back to his original injury. The wound is looking much better and cleaner with much less necrotic tissue. There is still  some surrounding cellulitis.  Neurological: He is alert and oriented to person, place, and time.  Skin: No rash noted.  Psychiatric: Mood and affect normal.    Lab Results Lab Results  Component Value Date   WBC 11.5 (H) 08/15/2016   HGB 10.5 (L) 08/15/2016   HCT 32.2 (L) 08/15/2016   MCV 95.8 08/15/2016   PLT 314 08/15/2016    Lab Results  Component Value Date   CREATININE 2.07 (H) 08/15/2016   BUN 33 (H) 08/15/2016   NA 140 08/15/2016   K 4.2 08/15/2016   CL 113 (H) 08/15/2016   CO2 21 (L) 08/15/2016    Lab Results  Component Value Date   ALT 23 08/11/2016   AST 22 08/11/2016   ALKPHOS 75 08/11/2016   BILITOT 0.5 08/11/2016     Microbiology: Recent Results (from the past 240 hour(s))  Culture, blood (routine x 2)     Status: None   Collection Time: 08/07/16 11:15 AM  Result Value Ref Range Status   Specimen Description BLOOD LEFT ANTECUBITAL  Final   Special Requests BOTTLES DRAWN AEROBIC AND ANAEROBIC 5CC  Final   Culture   Final    NO GROWTH 5 DAYS Performed at [facility], [place] [address], [place], [place] [address]    Report Status 08/12/2016 FINAL  Final  Culture, blood (routine x 2)     Status: None   Collection Time: 08/07/16 11:19 AM  Result Value Ref Range Status   Specimen Description BLOOD LEFT WRIST  Final   Special Requests BOTTLES DRAWN AEROBIC ONLY 5ML  Final   Culture   Final    NO GROWTH 5 DAYS Performed at [facility], [place] [address], [place], [place] [address]    Report Status 08/12/2016 FINAL  Final  MRSA PCR Screening     Status: None   Collection Time: 08/07/16 10:08 PM  Result Value Ref Range Status   MRSA by PCR NEGATIVE NEGATIVE Final    Comment:        The GeneXpert MRSA Assay (FDA approved for NASAL specimens only), is one component of a comprehensive MRSA colonization surveillance program. It is not intended to diagnose MRSA infection nor to guide or monitor treatment for MRSA infections.   Culture, blood  (routine x 2)     Status: None (Preliminary result)   Collection Time: 08/11/16  1:50 PM  Result Value Ref  Range Status   Specimen Description BLOOD LEFT ANTECUBITAL  Final   Special Requests NONE  Final   Culture   Final    NO GROWTH 4 DAYS Performed at [place] [address] [place], [place] [address]    Report Status PENDING  Incomplete  Culture, blood (routine x 2)     Status: None (Preliminary result)   Collection Time: 08/11/16  2:00 PM  Result Value Ref Range Status   Specimen Description BLOOD LEFT HAND  Final   Special Requests NONE  Final   Culture   Final    NO GROWTH 4 DAYS Performed at [facility], [place] [address], [place], [place] [address]    Report Status PENDING  Incomplete  Surgical PCR screen     Status: None   Collection Time: 08/13/16  1:53 PM  Result Value Ref Range Status   MRSA, PCR NEGATIVE NEGATIVE Final   Staphylococcus aureus NEGATIVE NEGATIVE Final    Comment:        The Xpert SA Assay (FDA approved for NASAL specimens in patients over 57 years of age), is one component of a comprehensive surveillance program.  Test performance has been validated by [facility] for patients greater than or equal to 5 year old. It is not intended to diagnose infection nor to guide or monitor treatment.      ASSESSMENT: He had a complex wound on his right hand and forearm with contamination with yard debris. It is now been debrided and is looking much better. There are no wound cultures to help guide antibiotic therapy. I recommend continued levofloxacin and metronidazole for broad empiric coverage. I would plan on about 10 more days of therapy but the optimal duration will depend on how the wound looks at the time of follow-up with Dr. Grandville Silos.  PLAN: 1. Discontinue vancomycin 2. Continue levofloxacin and metronidazole (he does not drink alcohol) for at least 10 more days 3. I will sign off now  Michel Bickers, MD [facility] for  [place] [phone] pager   [phone] cell 08/15/2016, 5:28 PM

## 2016-08-15 NOTE — Care Management Important Message (Signed)
Important Message  Patient Details  Name: Clayton [name] MRN: [id number] Date of Birth: 04/28/1928   Medicare Important Message Given:  Yes    Kerin Salen 08/15/2016, 10:42 AMImportant Message  Patient Details  Name: Clayton [name] MRN: [id number] Date of Birth: 1928/01/09   Medicare Important Message Given:  Yes    Kerin Salen 08/15/2016, 10:42 AM

## 2016-08-16 LAB — BASIC METABOLIC PANEL
ANION GAP: 6 (ref 5–15)
BUN: 33 mg/dL — AB (ref 6–20)
CHLORIDE: 113 mmol/L — AB (ref 101–111)
CO2: 22 mmol/L (ref 22–32)
Calcium: 8.6 mg/dL — ABNORMAL LOW (ref 8.9–10.3)
Creatinine, Ser: 1.93 mg/dL — ABNORMAL HIGH (ref 0.61–1.24)
GFR calc Af Amer: 35 mL/min — ABNORMAL LOW (ref >60)
GFR calc non Af Amer: 30 mL/min — ABNORMAL LOW (ref >60)
GLUCOSE: 106 mg/dL — AB (ref 65–99)
POTASSIUM: 4.4 mmol/L (ref 3.5–5.1)
Sodium: 141 mmol/L (ref 135–145)

## 2016-08-16 LAB — CBC
HEMATOCRIT: 28.6 % — AB (ref 39.0–52.0)
Hemoglobin: 9.5 g/dL — ABNORMAL LOW (ref 13.0–17.0)
MCH: 31.6 pg (ref 26.0–34.0)
MCHC: 33.2 g/dL (ref 30.0–36.0)
MCV: 95 fL (ref 78.0–100.0)
PLATELETS: 325 10*3/uL (ref 150–400)
RBC: 3.01 MIL/uL — AB (ref 4.22–5.81)
RDW: 13 % (ref 11.5–15.5)
WBC: 11.4 10*3/uL — AB (ref 4.0–10.5)

## 2016-08-16 LAB — CULTURE, BLOOD (ROUTINE X 2)
CULTURE: NO GROWTH
Culture: NO GROWTH

## 2016-08-16 LAB — GLUCOSE, CAPILLARY
Glucose-Capillary: 122 mg/dL — ABNORMAL HIGH (ref 65–99)
Glucose-Capillary: 191 mg/dL — ABNORMAL HIGH (ref 65–99)

## 2016-08-16 MED ORDER — POLYETHYLENE GLYCOL 3350 17 G PO PACK: 17.0000 g | PACK | Freq: Two times a day (BID) | ORAL | 0 refills | Status: DC

## 2016-08-16 MED ORDER — LEVOFLOXACIN 250 MG PO TABS: 250.0000 mg | ORAL_TABLET | Freq: Every day | ORAL | 0 refills | Status: DC

## 2016-08-16 MED ORDER — METRONIDAZOLE 500 MG PO TABS: 500.0000 mg | ORAL_TABLET | Freq: Three times a day (TID) | ORAL | 0 refills | Status: DC

## 2016-08-16 NOTE — Care Management Note (Signed)
Case Management Note  Patient Details  Name: Clayton [name] MRN: [id number] Date of Birth: Jan 01, 1928  Subjective/Objective:                  Cellulitis of right hand Action/Plan: Discharge planning Expected Discharge Date:  08/16/16               Expected Discharge Plan:  [place]  In-House Referral:     Discharge planning Services  CM Consult  Post Acute Care Choice:  Home Health Choice offered to:  Patient  DME Arranged:  [name] platform DME Agency:  [place] Arranged:  RN [facility] Agency:  [place]  Status of Service:  Completed, signed off  If discussed at [place] of Stay Meetings, dates discussed:    Additional Comments: CM met with pt in room to offer choice of home health agency. Pt chooses [facility] to render [facility].  Referral called to [facility] rep, Joelene Millin.  Joelene Millin will also deliver the [employer] extension for pt's rolling walker.  No other CM needs were communicated. Dellie Catholic, RN 08/16/2016, 12:49 PM

## 2016-08-16 NOTE — Progress Notes (Signed)
Physical Therapy Treatment Patient Details Name: Clayton [name] MRN: [id number] DOB: [birth year] Today's Date: 08/16/2016    History of Present Illness Clayton [name] a 83 y.o.malewith medical history of HTN, CKD stage IV, and IDDM  recently discharged from [facility] after a 2 day hospitalization for right hand cellulitis following a right hand wound that required debridement. He was discharged on doxycycline after improvement on IV vancomycin. Though he took doxycycline without emesis, the family noted worsening redness and that the wound began looking worse after discharge. On arrival he was afebrile. WBC and lactate were normal. Hand XR showed soft tissue swelling with no evidence of osteomyelitis. Because of the worsening erythema he was given IV vancomycin and admitted for failure of outpatient management of cellulitis. Hand surgery was consulted and took the patient to the OR for debridement 2/26. ID was consulted and has guided antibiotic recommendations.     PT Comments    Pt dressed and feeling "good".  In bed with R hand elevated on pillows (good).  Pt aware he is NWBing thru R hand.  Assisted OOB to amb with R platform walker.  Did well.  Steady/safe.  Pt amb a great distance.  Assisted back to bed.    Follow Up Recommendations  Home health PT     Equipment Recommendations   (R platform for his personal walker)    Recommendations for Other Services       Precautions / Restrictions Precautions Precautions: Fall Precaution Comments: NWB hand. Wound L hand- wrapped.  wound L elbow- also with a bandage Restrictions Weight Bearing Restrictions: Yes Other Position/Activity Restrictions: NWB R hand    Mobility  Bed Mobility Overal bed mobility: Modified Independent             General bed mobility comments: increased time with one VC to avoid WBin thru R hand  Transfers Overall transfer level: Needs assistance Equipment used: Rolling walker (2 wheeled) Transfers: Sit  to/from Stand Sit to Stand: Modified independent (Device/Increase time)         General transfer comment: one VC to avoid using R hand  Ambulation/Gait Ambulation/Gait assistance: Supervision Ambulation Distance (Feet): 145 Feet Assistive device: Right platform walker Gait Pattern/deviations: Step-through pattern Gait velocity: WFL   General Gait Details: did well using R platform walker.  One VC safety with turns   Financial trader Rankin (Stroke Patients Only)       Balance                                    Cognition Arousal/Alertness: Awake/alert Behavior During Therapy: WFL for tasks assessed/performed Overall Cognitive Status: Within Functional Limits for tasks assessed                      Exercises      General Comments        Pertinent Vitals/Pain Pain Assessment: No/denies pain Pain Location: numb    Home Living                      Prior Function            PT Goals (current goals can now be found in the care plan section) Progress towards PT goals: Progressing toward goals    Frequency    Min 3X/week  PT Plan Current plan remains appropriate    Co-evaluation             End of Session Equipment Utilized During Treatment: Gait belt Activity Tolerance: Patient tolerated treatment well Patient left: in bed Nurse Communication: Mobility status PT Visit Diagnosis: Unsteadiness on feet (R26.81);History of falling (Z91.81);Difficulty in walking, not elsewhere classified (R26.2)     Time: 1211-1228 PT Time Calculation (min) (ACUTE ONLY): 17 min  Charges:  $Gait Training: 8-22 mins                    G Codes:       Rica Koyanagi  PTA [facility]  Acute  Rehab Pager      [phone]

## 2016-08-16 NOTE — Discharge Summary (Signed)
Physician Discharge Summary  Clayton [name] DOB: February 12, 1928 DOA: 08/11/2016  PCP: Lujean Amel, MD  Admit date: 08/11/2016 Discharge date: 08/16/2016  Admitted From: Home  Disposition:  Home   Recommendations for Outpatient Follow-up:  1. Follow up with PCP in 1-2 weeks 2. Please obtain BMP/CBC in one week 3. Needs to follow up with Dr Grandville Silos for further care of wound infection.   Home Health: Yes.   Discharge Condition: stable.  CODE STATUS ; DNR Diet recommendation: Heart Healthy  Brief/Interim Summary: Clayton [name] a 83 y.o.malewith medical history of HTN, CKD stage IV, and IDDM  recently discharged from [facility] after a 2 day hospitalization for right hand cellulitis following a right hand wound that required debridement. He was discharged on doxycycline after improvement on IV vancomycin. Though he took doxycycline without emesis, the family noted worsening redness and that the wound began looking worse after discharge. On arrival he was afebrile. WBC and lactate were normal. Hand XR showed soft tissue swelling with no evidence of osteomyelitis. Because of the worsening erythema he was given IV vancomycin and admitted for failure of outpatient management of cellulitis. Hand surgery was consulted and took the patient to the OR for debridement 2/26. ID was consulted and has guided antibiotic recommendations.   Assessment & Plan:  Cellulitis of right hand: s/p operative debridement 2/26 by hand surgery, Dr. Grandville Silos. Had been recently admitted and was s/p I&D 2/20, improved on vanc, but worsened when transitioned to doxycycline. Blood cultures from recent admission are negative. WBC and lactate normal on admission. MRI was performed last admission and showed no evidence of dep infection, tendonitis, myositis, or osteomyelitis.  -Appreciated Dr Megan Salon and Dr Grandville Silos help.  -Started on Levaquin and flagyl orally, received  IV vancomycin while hospitalized. -plan is  to discharge on 11 days of Levaquin and flagyl. Wound care. Needs close follow up with Dr Grandville Silos.  -I removed dressing today, patient relates wound looks better today than yesterday.   Constipation; correction , he had BM 2 days ago  I have order laxative and suppository   Chronic bronchitis: No respiratory distress or hypoxemia. He has very coarse breath sounds which are his baseline. - Continuesymbicort, bronchodilators.  -Incentive spirometry.   IDT2DM: Most recent HbA1c 7.0% without reported hypoglycemia. DM + age are risk factors for poor wound healing.  -Lantus 20 units BID.  -SSI./   CKD stage IV: Cr remains at baseline 2.22 on admission; baseline 2.3-2.6 with GFR consistently in 20's.  - Monitor on vancomycin -renal function stable.   Hyperlipidemia - Continue Zocor  Lower extremity edema: Stable. - resume  lasix at discharge -NSL   Insomnia: Chronic. Resume melatonin    Discharge Diagnoses:  Principal Problem:   Cellulitis of right hand Active Problems:   Essential hypertension   Sleep apnea   Diabetes mellitus (HCC)   BPH (benign prostatic hyperplasia)   Type 2 diabetes mellitus with hyperglycemia, with long-term current use of insulin (HCC)   Chronic kidney disease (CKD), stage IV (severe) (HCC)    Discharge Instructions  Discharge Instructions    Diet - low sodium heart healthy    Complete by:  As directed    Increase activity slowly    Complete by:  As directed      Allergies as of 08/16/2016      Reactions   Nitrous Oxide Other (See Comments)   Reaction:  Unknown    Penicillins Other (See Comments)   Reaction:  Unknown Has patient  had a PCN reaction causing immediate rash, facial/tongue/throat swelling, SOB or lightheadedness with hypotension: Unsure Has patient had a PCN reaction causing severe rash involving mucus membranes or skin necrosis: Unsure Has patient had a PCN reaction that required hospitalization Unsure  Has patient had a  PCN reaction occurring within the last 10 years: No If all of the above answers are "NO", then may proceed with Cephalosporin use.   Tape Other (See Comments)   Reaction:  Tears pts skin    Diltiazem Rash      Medication List    STOP taking these medications   doxycycline 100 MG capsule Commonly known as:  VIBRAMYCIN     TAKE these medications   albuterol 108 (90 Base) MCG/ACT inhaler Commonly known as:  PROVENTIL HFA;VENTOLIN HFA Inhale 1-2 puffs into the lungs every 6 (six) hours as needed for wheezing or shortness of breath.   budesonide-formoterol 160-4.5 MCG/ACT inhaler Commonly known as:  SYMBICORT Inhale 2 puffs into the lungs 2 (two) times daily.   cholecalciferol 1000 units tablet Commonly known as:  VITAMIN D Take 2,000 Units by mouth daily.   famotidine 20 MG tablet Commonly known as:  PEPCID Take 20 mg by mouth at bedtime.   ferrous sulfate 325 (65 FE) MG tablet Take 325 mg by mouth daily with breakfast.   finasteride 5 MG tablet Commonly known as:  PROSCAR Take 5 mg by mouth at bedtime.   furosemide 20 MG tablet Commonly known as:  LASIX TAKE 1 TABLET EVERY DAY What changed:  See the new instructions.   insulin aspart 100 UNIT/ML injection Commonly known as:  novoLOG Inject 15-17 Units into the skin 3 (three) times daily before meals. Pt uses 15 units before breakfast, 17 units before lunch, and 17 units before dinner.   insulin glargine 100 UNIT/ML injection Commonly known as:  LANTUS Inject 20 Units into the skin 2 (two) times daily.   levofloxacin 250 MG tablet Commonly known as:  LEVAQUIN Take 1 tablet (250 mg total) by mouth daily at 3 pm.   MELATONIN PO Take 1 tablet by mouth at bedtime.   metroNIDAZOLE 500 MG tablet Commonly known as:  FLAGYL Take 1 tablet (500 mg total) by mouth every 8 (eight) hours.   montelukast 10 MG tablet Commonly known as:  SINGULAIR Take 10 mg by mouth at bedtime.   multivitamin with minerals Tabs  tablet Take 1 tablet by mouth daily.   omeprazole 20 MG capsule Commonly known as:  PRILOSEC Take 20 mg by mouth 2 (two) times daily before a meal.   polyethylene glycol packet Commonly known as:  MIRALAX / GLYCOLAX Take 17 g by mouth 2 (two) times daily.   psyllium 0.52 g capsule Commonly known as:  REGULOID Take 0.52 g by mouth daily.   saccharomyces boulardii 250 MG capsule Commonly known as:  FLORASTOR Take 1 capsule (250 mg total) by mouth 2 (two) times daily.   simvastatin 20 MG tablet Commonly known as:  ZOCOR Take 20 mg by mouth at bedtime.   TOVIAZ 8 MG Tb24 tablet Generic drug:  fesoterodine Take 8 mg by mouth at bedtime.   traMADol 50 MG tablet Commonly known as:  ULTRAM Take 1 tablet (50 mg total) by mouth every 12 (twelve) hours as needed (breakthrough pain).      Follow-up Information    THOMPSON, DAVID A., MD. Schedule an appointment as soon as possible for a visit.   Specialty:  Orthopedic Surgery Why:  for 7-10  days from 08-15-16 Contact information: [address]  [place] [address] [phone]          Allergies  Allergen Reactions  . Nitrous Oxide Other (See Comments)    Reaction:  Unknown   . Penicillins Other (See Comments)    Reaction:  Unknown Has patient had a PCN reaction causing immediate rash, facial/tongue/throat swelling, SOB or lightheadedness with hypotension: Unsure Has patient had a PCN reaction causing severe rash involving mucus membranes or skin necrosis: Unsure Has patient had a PCN reaction that required hospitalization Unsure  Has patient had a PCN reaction occurring within the last 10 years: No If all of the above answers are "NO", then may proceed with Cephalosporin use.  . Tape Other (See Comments)    Reaction:  Tears pts skin   . Diltiazem Rash    Consultations:  ID  Dr Grandville Silos.    Procedures/Studies: Dg Chest 2 View  Result Date: 08/07/2016 CLINICAL DATA:  Dyspnea and rhonchi.  Shortness of  breath. EXAM: CHEST  2 VIEW COMPARISON:  CT of the chest 12/08/2015 FINDINGS: Cardiomediastinal silhouette is normal. Mediastinal contours appear intact. There is no evidence of focal airspace consolidation, pleural effusion or pneumothorax. Bilateral peribronchial thickening with lower lobe predominance with mild bronchiectasis. Osseous structures are without acute abnormality. Soft tissues are grossly normal. IMPRESSION: Lower lobe bronchiectasis with mild peribronchial thickening, thought to represent chronic bronchitis. No evidence of lobar consolidation. Electronically Signed   By: Fidela Salisbury M.D.   On: 08/07/2016 20:06   Mr Hand Right Wo Contrast  Result Date: 08/09/2016 CLINICAL DATA:  Right hand redness and swelling for 3 days since a trip and fall 08/05/2016 resulting in a laceration of the right hand. EXAM: MRI OF THE RIGHT HAND WITHOUT CONTRAST TECHNIQUE: Multiplanar, multisequence MR imaging of the right hand was performed. No intravenous contrast was administered. COMPARISON:  Plain films right hand 08/07/2016. FINDINGS: Bones/Joint/Cartilage No bone marrow signal abnormality to suggest osteomyelitis is identified. Scattered degenerative change is most notable about the radiocarpal and first CMC joints. Remote healed fracture of the distal radius is noted. Ligaments Intact. Muscles and Tendons Intact. Soft tissues Bandaging is present about the dorsum of the hand between the fourth and fifth MCP joints. There is diffuse subcutaneous edema about the hand without focal fluid collection identified. IMPRESSION: Diffuse subcutaneous edema about the hand compatible with cellulitis. Negative for abscess, septic joint or osteomyelitis. Remote healed distal radius fracture. Radiocarpal and first [place] osteoarthritis. Electronically Signed   By: Inge Rise M.D.   On: 08/09/2016 09:59   Dg Hand Complete Right  Result Date: 08/11/2016 CLINICAL DATA:  83 year old male with progressive redness  and swelling of the right hand despite antibiotic therapy for cellulitis EXAM: RIGHT HAND - COMPLETE 3+ VIEW COMPARISON:  None. FINDINGS: No evidence of acute fracture or malalignment. Abnormal appearance of the distal radius with coarsening of the trabecular pattern and widening of the cortical space. Multifocal degenerative osteoarthritis including the radiocarpal joints, base of the thumb carpometacarpal joint, and the distal interphalangeal joints. Soft tissue swelling is present involving the hand and all digits. Scattered atherosclerotic vascular calcifications present along the radial artery. No radiopaque retained foreign body. IMPRESSION: 1. Diffuse soft tissue swelling affecting the hands and second through fifth digits. 2. Abnormal appearance of the distal radius which may reflect the sequelae of remote healed trauma or Paget's disease. 3. Multifocal degenerative osteoarthritis. Electronically Signed   By: Jacqulynn Cadet M.D.   On: 08/11/2016 14:32  Dg Hand Complete Right  Result Date: 08/07/2016 CLINICAL DATA:  Recent fall and hit the posterior right hand with laceration. Patient now has increased pain and redness. EXAM: RIGHT HAND - COMPLETE 3+ VIEW COMPARISON:  09/25/2015 FINDINGS: Stable cortical thickening and deformity of the distal radius compatible with an old fracture. Stable narrowing at the radiocarpal joint. Chronic osteoarthritic changes at the first carpometacarpal joint. Atherosclerotic calcifications along the volar aspect of the wrist. There is no evidence for an acute fracture or dislocation. There appears to be soft tissue swelling along the dorsum of the hand and wrist. IMPRESSION: No acute bone abnormality to the right wrist. Chronic changes as described. Electronically Signed   By: Markus Daft M.D.   On: 08/07/2016 10:29      Subjective: He denies abdominal pain.  He report pain in hand is better   Discharge Exam: Vitals:   08/15/16 2024 08/16/16 0535  BP: (!)  153/62 132/60  Pulse: 89 81  Resp: 18 16  Temp: 97.9 F (36.6 C) 98.2 F (36.8 C)   Vitals:   08/15/16 1932 08/15/16 2024 08/16/16 0535 08/16/16 0902  BP:  (!) 153/62 132/60   Pulse:  89 81   Resp:  18 16   Temp:  97.9 F (36.6 C) 98.2 F (36.8 C)   TempSrc:  Oral Oral   SpO2: 97% 97% 97% 96%  Weight:      Height:        General: Pt is alert, awake, not in acute distress Cardiovascular: RRR, S1/S2 +, no rubs, no gallops Respiratory: CTA bilaterally, no wheezing, no rhonchi Abdominal: Soft, NT, ND, bowel sounds + Extremities: no edema, no cyanosis. Right hand with open wound no drainage, little finger with redness.     The results of significant diagnostics from this hospitalization (including imaging, microbiology, ancillary and laboratory) are listed below for reference.     Microbiology: Recent Results (from the past 240 hour(s))  Culture, blood (routine x 2)     Status: None   Collection Time: 08/07/16 11:15 AM  Result Value Ref Range Status   Specimen Description BLOOD LEFT ANTECUBITAL  Final   Special Requests BOTTLES DRAWN AEROBIC AND ANAEROBIC 5CC  Final   Culture   Final    NO GROWTH 5 DAYS Performed at [facility], [address], [place], [place] [address]    Report Status 08/12/2016 FINAL  Final  Culture, blood (routine x 2)     Status: None   Collection Time: 08/07/16 11:19 AM  Result Value Ref Range Status   Specimen Description BLOOD LEFT WRIST  Final   Special Requests BOTTLES DRAWN AEROBIC ONLY 5ML  Final   Culture   Final    NO GROWTH 5 DAYS Performed at [facility], [place] [address] [place], [place] [address]    Report Status 08/12/2016 FINAL  Final  MRSA PCR Screening     Status: None   Collection Time: 08/07/16 10:08 PM  Result Value Ref Range Status   MRSA by PCR NEGATIVE NEGATIVE Final    Comment:        The GeneXpert MRSA Assay (FDA approved for NASAL specimens only), is one component of a comprehensive MRSA  colonization surveillance program. It is not intended to diagnose MRSA infection nor to guide or monitor treatment for MRSA infections.   Culture, blood (routine x 2)     Status: None (Preliminary result)   Collection Time: 08/11/16  1:50 PM  Result  Value Ref Range Status   Specimen Description BLOOD LEFT ANTECUBITAL  Final   Special Requests NONE  Final   Culture   Final    NO GROWTH 4 DAYS Performed at [facility], [address], [place], [place] [address]    Report Status PENDING  Incomplete  Culture, blood (routine x 2)     Status: None (Preliminary result)   Collection Time: 08/11/16  2:00 PM  Result Value Ref Range Status   Specimen Description BLOOD LEFT HAND  Final   Special Requests NONE  Final   Culture   Final    NO GROWTH 4 DAYS Performed at [name], [place] [address] [place],  [address]    Report Status PENDING  Incomplete  Surgical PCR screen     Status: None   Collection Time: 08/13/16  1:53 PM  Result Value Ref Range Status   MRSA, PCR NEGATIVE NEGATIVE Final   Staphylococcus aureus NEGATIVE NEGATIVE Final    Comment:        The Xpert SA Assay (FDA approved for NASAL specimens in patients over 7 years of age), is one component of a comprehensive surveillance program.  Test performance has been validated by [facility] for patients greater than or equal to 62 year old. It is not intended to diagnose infection nor to guide or monitor treatment.      Labs: BNP (last 3 results) No results for input(s): BNP in the last 8760 hours. Basic Metabolic Panel:  Recent Labs Lab 08/11/16 1348 08/13/16 0515 08/14/16 0507 08/15/16 0551 08/16/16 0557  NA 140 141 139 140 141  K 3.7 4.2 4.6 4.2 4.4  CL 107 112* 112* 113* 113*  CO2 '24 22 22 '$ 21* 22  GLUCOSE 116* 134* 184* 142* 106*  BUN 45* 40* 31* 33* 33*  CREATININE 2.22* 2.05* 1.75* 2.07* 1.93*  CALCIUM 9.2 8.8* 8.4* 8.4* 8.6*   Liver Function Tests:  Recent Labs Lab  08/11/16 1348  AST 22  ALT 23  ALKPHOS 75  BILITOT 0.5  PROT 7.4  ALBUMIN 3.5   No results for input(s): LIPASE, AMYLASE in the last 168 hours. No results for input(s): AMMONIA in the last 168 hours. CBC:  Recent Labs Lab 08/11/16 1348 08/12/16 0518 08/13/16 0515 08/14/16 0507 08/15/16 0551 08/16/16 0557  WBC 12.2* 10.2 11.2* 13.6* 11.5* 11.4*  NEUTROABS 9.2*  --   --   --   --   --   HGB 11.7* 10.0* 10.1* 10.1* 10.5* 9.5*  HCT 36.2* 30.8* 31.1* 30.9* 32.2* 28.6*  MCV 96.5 95.7 95.7 95.7 95.8 95.0  PLT 335 301 302 308 314 325   Cardiac Enzymes: No results for input(s): CKTOTAL, CKMB, CKMBINDEX, TROPONINI in the last 168 hours. BNP: Invalid input(s): POCBNP CBG:  Recent Labs Lab 08/15/16 0746 08/15/16 1132 08/15/16 1734 08/15/16 2121 08/16/16 0727  GLUCAP 139* 202* 145* 173* 122*   D-Dimer No results for input(s): DDIMER in the last 72 hours. Hgb A1c No results for input(s): HGBA1C in the last 72 hours. Lipid Profile No results for input(s): CHOL, HDL, LDLCALC, TRIG, CHOLHDL, LDLDIRECT in the last 72 hours. Thyroid function studies No results for input(s): TSH, T4TOTAL, T3FREE, THYROIDAB in the last 72 hours.  Invalid input(s): FREET3 Anemia work up No results for input(s): VITAMINB12, FOLATE, FERRITIN, TIBC, IRON, RETICCTPCT in the last 72 hours. Urinalysis    Component Value Date/Time   COLORURINE YELLOW 07/19/2015 [place] 07/19/2015 0829  LABSPEC 1.015 07/19/2015 0829   PHURINE 6.0 07/19/2015 0829   GLUCOSEU NEGATIVE 07/19/2015 0829   HGBUR NEGATIVE 07/19/2015 0829   BILIRUBINUR NEGATIVE 07/19/2015 0829   KETONESUR NEGATIVE 07/19/2015 0829   PROTEINUR 100 (A) 06/20/2012 0603   UROBILINOGEN 0.2 07/19/2015 0829   NITRITE NEGATIVE 07/19/2015 0829   LEUKOCYTESUR NEGATIVE 07/19/2015 0829   Sepsis Labs Invalid input(s): PROCALCITONIN,  WBC,  LACTICIDVEN Microbiology Recent Results (from the past 240 hour(s))  Culture, blood  (routine x 2)     Status: None   Collection Time: 08/07/16 11:15 AM  Result Value Ref Range Status   Specimen Description BLOOD LEFT ANTECUBITAL  Final   Special Requests BOTTLES DRAWN AEROBIC AND ANAEROBIC 5CC  Final   Culture   Final    NO GROWTH 5 DAYS Performed at [facility], [place] [address], [place], [place] [address]    Report Status 08/12/2016 FINAL  Final  Culture, blood (routine x 2)     Status: None   Collection Time: 08/07/16 11:19 AM  Result Value Ref Range Status   Specimen Description BLOOD LEFT WRIST  Final   Special Requests BOTTLES DRAWN AEROBIC ONLY 5ML  Final   Culture   Final    NO GROWTH 5 DAYS Performed at [facility], [place] [address], [place], [place] [address]    Report Status 08/12/2016 FINAL  Final  MRSA PCR Screening     Status: None   Collection Time: 08/07/16 10:08 PM  Result Value Ref Range Status   MRSA by PCR NEGATIVE NEGATIVE Final    Comment:        The GeneXpert MRSA Assay (FDA approved for NASAL specimens only), is one component of a comprehensive MRSA colonization surveillance program. It is not intended to diagnose MRSA infection nor to guide or monitor treatment for MRSA infections.   Culture, blood (routine x 2)     Status: None (Preliminary result)   Collection Time: 08/11/16  1:50 PM  Result Value Ref Range Status   Specimen Description BLOOD LEFT ANTECUBITAL  Final   Special Requests NONE  Final   Culture   Final    NO GROWTH 4 DAYS Performed at [facility], [place] [address], [place], [place] [address]    Report Status PENDING  Incomplete  Culture, blood (routine x 2)     Status: None (Preliminary result)   Collection Time: 08/11/16  2:00 PM  Result Value Ref Range Status   Specimen Description BLOOD LEFT HAND  Final   Special Requests NONE  Final   Culture   Final    NO GROWTH 4 DAYS Performed at [facility], [place] [address], [place], [place] [address]    Report Status PENDING  Incomplete   Surgical PCR screen     Status: None   Collection Time: 08/13/16  1:53 PM  Result Value Ref Range Status   MRSA, PCR NEGATIVE NEGATIVE Final   Staphylococcus aureus NEGATIVE NEGATIVE Final    Comment:        The Xpert SA Assay (FDA approved for NASAL specimens in patients over 25 years of age), is one component of a comprehensive surveillance program.  Test performance has been validated by [facility] for patients greater than or equal to 47 year old. It is not intended to diagnose infection nor to guide or monitor treatment.      Time coordinating discharge: Over 30 minutes  SIGNED:   Niel Hummer A,  MD  [facility] 08/16/2016, 10:51 AM Pager   If 7PM-7AM, please contact night-coverage [email or web address] Password [email or web address]

## 2016-08-17 DIAGNOSIS — I129 Hypertensive chronic kidney disease with stage 1 through stage 4 chronic kidney disease, or unspecified chronic kidney disease: Secondary | ICD-10-CM | POA: Diagnosis not present

## 2016-08-17 DIAGNOSIS — Z794 Long term (current) use of insulin: Secondary | ICD-10-CM | POA: Diagnosis not present

## 2016-08-17 DIAGNOSIS — S50901D Unspecified superficial injury of right elbow, subsequent encounter: Secondary | ICD-10-CM | POA: Diagnosis not present

## 2016-08-17 DIAGNOSIS — E1122 Type 2 diabetes mellitus with diabetic chronic kidney disease: Secondary | ICD-10-CM | POA: Diagnosis not present

## 2016-08-17 DIAGNOSIS — M5136 Other intervertebral disc degeneration, lumbar region: Secondary | ICD-10-CM | POA: Diagnosis not present

## 2016-08-17 DIAGNOSIS — E1142 Type 2 diabetes mellitus with diabetic polyneuropathy: Secondary | ICD-10-CM | POA: Diagnosis not present

## 2016-08-17 DIAGNOSIS — N183 Chronic kidney disease, stage 3 (moderate): Secondary | ICD-10-CM | POA: Diagnosis not present

## 2016-08-17 DIAGNOSIS — M810 Age-related osteoporosis without current pathological fracture: Secondary | ICD-10-CM | POA: Diagnosis not present

## 2016-08-17 DIAGNOSIS — D649 Anemia, unspecified: Secondary | ICD-10-CM | POA: Diagnosis not present

## 2016-08-17 DIAGNOSIS — L03113 Cellulitis of right upper limb: Secondary | ICD-10-CM | POA: Diagnosis not present

## 2016-09-06 DIAGNOSIS — M25641 Stiffness of right hand, not elsewhere classified: Secondary | ICD-10-CM | POA: Diagnosis not present

## 2016-09-06 DIAGNOSIS — S61401D Unspecified open wound of right hand, subsequent encounter: Secondary | ICD-10-CM | POA: Diagnosis not present

## 2016-09-07 DIAGNOSIS — Z794 Long term (current) use of insulin: Secondary | ICD-10-CM | POA: Diagnosis not present

## 2016-09-07 DIAGNOSIS — R29898 Other symptoms and signs involving the musculoskeletal system: Secondary | ICD-10-CM | POA: Diagnosis not present

## 2016-09-07 DIAGNOSIS — Z79899 Other long term (current) drug therapy: Secondary | ICD-10-CM | POA: Diagnosis not present

## 2016-09-07 DIAGNOSIS — R5381 Other malaise: Secondary | ICD-10-CM | POA: Diagnosis not present

## 2016-09-07 DIAGNOSIS — R21 Rash and other nonspecific skin eruption: Secondary | ICD-10-CM | POA: Diagnosis not present

## 2016-09-07 DIAGNOSIS — E1121 Type 2 diabetes mellitus with diabetic nephropathy: Secondary | ICD-10-CM | POA: Diagnosis not present

## 2016-09-07 DIAGNOSIS — S61401A Unspecified open wound of right hand, initial encounter: Secondary | ICD-10-CM | POA: Diagnosis not present

## 2016-09-10 DIAGNOSIS — S61401D Unspecified open wound of right hand, subsequent encounter: Secondary | ICD-10-CM | POA: Diagnosis not present

## 2016-09-10 DIAGNOSIS — M25641 Stiffness of right hand, not elsewhere classified: Secondary | ICD-10-CM | POA: Diagnosis not present

## 2016-09-11 DIAGNOSIS — Z9181 History of falling: Secondary | ICD-10-CM | POA: Diagnosis not present

## 2016-09-11 DIAGNOSIS — R262 Difficulty in walking, not elsewhere classified: Secondary | ICD-10-CM | POA: Diagnosis not present

## 2016-09-11 DIAGNOSIS — R531 Weakness: Secondary | ICD-10-CM | POA: Diagnosis not present

## 2016-09-13 DIAGNOSIS — Z9181 History of falling: Secondary | ICD-10-CM | POA: Diagnosis not present

## 2016-09-13 DIAGNOSIS — R531 Weakness: Secondary | ICD-10-CM | POA: Diagnosis not present

## 2016-09-13 DIAGNOSIS — R262 Difficulty in walking, not elsewhere classified: Secondary | ICD-10-CM | POA: Diagnosis not present

## 2016-09-17 DIAGNOSIS — Z9181 History of falling: Secondary | ICD-10-CM | POA: Diagnosis not present

## 2016-09-17 DIAGNOSIS — R531 Weakness: Secondary | ICD-10-CM | POA: Diagnosis not present

## 2016-09-17 DIAGNOSIS — R262 Difficulty in walking, not elsewhere classified: Secondary | ICD-10-CM | POA: Diagnosis not present

## 2016-09-18 DIAGNOSIS — R262 Difficulty in walking, not elsewhere classified: Secondary | ICD-10-CM | POA: Diagnosis not present

## 2016-09-18 DIAGNOSIS — R531 Weakness: Secondary | ICD-10-CM | POA: Diagnosis not present

## 2016-09-18 DIAGNOSIS — Z9181 History of falling: Secondary | ICD-10-CM | POA: Diagnosis not present

## 2016-09-19 ENCOUNTER — Telehealth: Payer: Self-pay | Admitting: Endocrinology

## 2016-09-19 DIAGNOSIS — R531 Weakness: Secondary | ICD-10-CM | POA: Diagnosis not present

## 2016-09-19 DIAGNOSIS — Z9181 History of falling: Secondary | ICD-10-CM | POA: Diagnosis not present

## 2016-09-19 DIAGNOSIS — R262 Difficulty in walking, not elsewhere classified: Secondary | ICD-10-CM | POA: Diagnosis not present

## 2016-09-19 NOTE — Telephone Encounter (Signed)
Please advise if ok to refill these medication. The medications are listed under a historical provider. Thanks!

## 2016-09-20 NOTE — Telephone Encounter (Signed)
ok 

## 2016-09-20 NOTE — Telephone Encounter (Signed)
Refills submitted.  

## 2016-09-25 ENCOUNTER — Other Ambulatory Visit (INDEPENDENT_AMBULATORY_CARE_PROVIDER_SITE_OTHER): Payer: Medicare PPO

## 2016-09-25 ENCOUNTER — Other Ambulatory Visit: Payer: Self-pay | Admitting: Endocrinology

## 2016-09-25 DIAGNOSIS — R262 Difficulty in walking, not elsewhere classified: Secondary | ICD-10-CM | POA: Diagnosis not present

## 2016-09-25 DIAGNOSIS — Z9181 History of falling: Secondary | ICD-10-CM | POA: Diagnosis not present

## 2016-09-25 DIAGNOSIS — R531 Weakness: Secondary | ICD-10-CM | POA: Diagnosis not present

## 2016-09-25 DIAGNOSIS — D638 Anemia in other chronic diseases classified elsewhere: Secondary | ICD-10-CM

## 2016-09-25 DIAGNOSIS — N184 Chronic kidney disease, stage 4 (severe): Secondary | ICD-10-CM

## 2016-09-25 DIAGNOSIS — Z794 Long term (current) use of insulin: Secondary | ICD-10-CM

## 2016-09-25 DIAGNOSIS — E1165 Type 2 diabetes mellitus with hyperglycemia: Secondary | ICD-10-CM

## 2016-09-25 LAB — CBC WITH DIFFERENTIAL/PLATELET
BASOS ABS: 0.1 10*3/uL (ref 0.0–0.1)
Basophils Relative: 1 % (ref 0.0–3.0)
EOS ABS: 0.5 10*3/uL (ref 0.0–0.7)
Eosinophils Relative: 5.9 % — ABNORMAL HIGH (ref 0.0–5.0)
HCT: 35 % — ABNORMAL LOW (ref 39.0–52.0)
HEMOGLOBIN: 11.6 g/dL — AB (ref 13.0–17.0)
LYMPHS ABS: 1.5 10*3/uL (ref 0.7–4.0)
Lymphocytes Relative: 16.1 % (ref 12.0–46.0)
MCHC: 33.2 g/dL (ref 30.0–36.0)
MCV: 94.7 fl (ref 78.0–100.0)
MONO ABS: 0.9 10*3/uL (ref 0.1–1.0)
Monocytes Relative: 9.9 % (ref 3.0–12.0)
NEUTROS PCT: 67.1 % (ref 43.0–77.0)
Neutro Abs: 6.2 10*3/uL (ref 1.4–7.7)
Platelets: 339 10*3/uL (ref 150.0–400.0)
RBC: 3.69 Mil/uL — AB (ref 4.22–5.81)
RDW: 14.2 % (ref 11.5–15.5)
WBC: 9.2 10*3/uL (ref 4.0–10.5)

## 2016-09-25 LAB — IBC PANEL
Iron: 53 ug/dL (ref 42–165)
SATURATION RATIOS: 17.7 % — AB (ref 20.0–50.0)
Transferrin: 214 mg/dL (ref 212.0–360.0)

## 2016-09-25 LAB — COMPREHENSIVE METABOLIC PANEL
ALT: 10 U/L (ref 0–53)
AST: 16 U/L (ref 0–37)
Albumin: 3.7 g/dL (ref 3.5–5.2)
Alkaline Phosphatase: 79 U/L (ref 39–117)
BILIRUBIN TOTAL: 0.4 mg/dL (ref 0.2–1.2)
BUN: 40 mg/dL — AB (ref 6–23)
CO2: 26 meq/L (ref 19–32)
Calcium: 9.5 mg/dL (ref 8.4–10.5)
Chloride: 107 mEq/L (ref 96–112)
Creatinine, Ser: 2.21 mg/dL — ABNORMAL HIGH (ref 0.40–1.50)
GFR: 30.11 mL/min — AB (ref >60.00)
GLUCOSE: 127 mg/dL — AB (ref 70–99)
Potassium: 4.2 mEq/L (ref 3.5–5.1)
SODIUM: 141 meq/L (ref 135–145)
Total Protein: 7.3 g/dL (ref 6.0–8.3)

## 2016-09-26 DIAGNOSIS — R262 Difficulty in walking, not elsewhere classified: Secondary | ICD-10-CM | POA: Diagnosis not present

## 2016-09-26 DIAGNOSIS — R531 Weakness: Secondary | ICD-10-CM | POA: Diagnosis not present

## 2016-09-26 DIAGNOSIS — Z9181 History of falling: Secondary | ICD-10-CM | POA: Diagnosis not present

## 2016-10-01 DIAGNOSIS — R531 Weakness: Secondary | ICD-10-CM | POA: Diagnosis not present

## 2016-10-01 DIAGNOSIS — R262 Difficulty in walking, not elsewhere classified: Secondary | ICD-10-CM | POA: Diagnosis not present

## 2016-10-01 DIAGNOSIS — Z9181 History of falling: Secondary | ICD-10-CM | POA: Diagnosis not present

## 2016-10-03 ENCOUNTER — Ambulatory Visit (INDEPENDENT_AMBULATORY_CARE_PROVIDER_SITE_OTHER): Payer: Medicare PPO | Admitting: Endocrinology

## 2016-10-03 ENCOUNTER — Encounter: Payer: Self-pay | Admitting: Endocrinology

## 2016-10-03 VITALS — BP 130/62 | HR 89 | Ht 70.0 in | Wt 222.0 lb

## 2016-10-03 DIAGNOSIS — Z9181 History of falling: Secondary | ICD-10-CM | POA: Diagnosis not present

## 2016-10-03 DIAGNOSIS — N184 Chronic kidney disease, stage 4 (severe): Secondary | ICD-10-CM

## 2016-10-03 DIAGNOSIS — D508 Other iron deficiency anemias: Secondary | ICD-10-CM | POA: Diagnosis not present

## 2016-10-03 DIAGNOSIS — E1165 Type 2 diabetes mellitus with hyperglycemia: Secondary | ICD-10-CM | POA: Diagnosis not present

## 2016-10-03 DIAGNOSIS — R531 Weakness: Secondary | ICD-10-CM | POA: Diagnosis not present

## 2016-10-03 DIAGNOSIS — Z794 Long term (current) use of insulin: Secondary | ICD-10-CM

## 2016-10-03 DIAGNOSIS — R262 Difficulty in walking, not elsewhere classified: Secondary | ICD-10-CM | POA: Diagnosis not present

## 2016-10-03 LAB — POCT GLYCOSYLATED HEMOGLOBIN (HGB A1C): HEMOGLOBIN A1C: 6

## 2016-10-03 NOTE — Patient Instructions (Addendum)
Lantus 18 units 2x daily  Take iron 2x daily  Check blood sugars on waking up  daily  Also check blood sugars about 2 hours after a meal and do this after different meals by rotation  Recommended blood sugar levels on waking up is 90-130 and about 2 hours after meal is 130-160  Please bring your blood sugar monitor to each visit, thank you

## 2016-10-03 NOTE — Progress Notes (Signed)
Patient ID: Clayton [name], male   DOB: 1928/01/14, 83 y.o.   MRN: [id number]   Reason for Appointment: Followup of diabetes and various issues  History of Present Illness   Type 2 DIABETES MELITUS diagnosed 1989  He has had long-standing diabetes and has been on basal bolus insulin for the last few years Since 2014 he has required larger doses of insulin especially with having to take periodic courses of steroids for his pulmonary problems Also his Actos was stopped because of tendency to edema and metformin stopped because of renal dysfunction  Insulin regimen: Novolog 10 units before meals, Lantus  20-20  a day.  A1c is lower than usual at 6%, previously 7.2  Current blood sugar patterns and problems identified:  He was hospitalized in February and appears to have lost significant amount of weight  His blood sugars started dropping significantly in late March at least and until 10 days ago were getting frequently low or lunch and supper time  He then reduced his NovoLog significantly down to 10 units but did not change his Lantus  His fasting blood sugars are low normal although only once as being as low as 65  LOWEST blood sugar was 48 a week ago at suppertime  He has not been able to be active at all since he has been hospitalized and was on a walker and now okay  Also he thinks his appetite is generally decreased overall although he had a full sandwich for lunch today  Blood sugars at lunch and supper usually are fairly good recently except for sporadic high readings possibly from higher carbohydrate intake  Does not check blood sugars after supper despite reminders.  With his sugars overall lower he has not had as much fluctuation sugars recently compared to the last time   Hypoglycemia:  recently as above   Oral hypoglycemic drugs: None           Monitors blood glucose:  3-4 times a day   Glucometer: Accu-Chek        Glucose  monitor download shows the  following results  Average reading are for the last 4 weeks  Mean values apply above for all meters except median for One Touch  PRE-MEAL Fasting Lunch Dinner Bedtime Overall  Glucose range:  65-1 39   52-1 79   48-268     Mean/median: 106  110  116   110     Meals: 3 meals per day.  breakfast: oatmeal / grits, PB Dinner usually at 6:30 pm       Physical activity: exercise: bike daily  Weight history:  Wt Readings from Last 3 Encounters:  10/03/16 222 lb (100.7 kg)  08/15/16 235 lb 0.2 oz (106.6 kg)  08/08/16 234 lb 2.1 oz (106.2 kg)               Complications are: Nephropathy. Microalbumin level now consistently normal  LABS:  Lab Results  Component Value Date   HGBA1C 6.0 10/03/2016   HGBA1C 7.0 (H) 07/11/2016   HGBA1C 7.2 (H) 06/21/2016   Lab Results  Component Value Date   MICROALBUR 25.6 (H) 06/21/2016   LDLCALC 66 01/25/2016   CREATININE 2.21 (H) 09/25/2016     Problem 2:  RENAL insufficiency: He has had chronic increase in creatinine, was 1.7 in 2014. This has been related to nephropathy and glomerulosclerosis ACE inhibitor/ARB recommended by nephrologist but not done because of the history  of patient getting hyperkalemia with these drugs in the past Renal ultrasound did not show any abnormality  Creatinine is overall stable,  highest 2.6  He has a normal PTH, not on calcitriol and only on vitamin D He does not want to follow-up with nephrologist   Lab Results  Component Value Date   CREATININE 2.21 (H) 09/25/2016   CREATININE 1.93 (H) 08/16/2016   CREATININE 2.07 (H) 08/15/2016   CREATININE 1.75 (H) 08/14/2016    Proteinuria: He was given diltiazem previously For his proteinuria  when blood pressure was relatively high  but he claims that it was causing itching on his abdominal area Currently has mild increase in microalbumin    Allergies as of 10/03/2016      Reactions   Nitrous Oxide Other (See Comments)   Reaction:  Unknown     Penicillins Other (See Comments)   Reaction:  Unknown Has patient had a PCN reaction causing immediate rash, facial/tongue/throat swelling, SOB or lightheadedness with hypotension: Unsure Has patient had a PCN reaction causing severe rash involving mucus membranes or skin necrosis: Unsure Has patient had a PCN reaction that required hospitalization Unsure  Has patient had a PCN reaction occurring within the last 10 years: No If all of the above answers are "NO", then may proceed with Cephalosporin use.   Tape Other (See Comments)   Reaction:  Tears pts skin    Diltiazem Rash      Medication List       Accurate as of 10/03/16  8:40 PM. Always use your most recent med list.          albuterol 108 (90 Base) MCG/ACT inhaler Commonly known as:  PROVENTIL HFA;VENTOLIN HFA Inhale 1-2 puffs into the lungs every 6 (six) hours as needed for wheezing or shortness of breath.   B-D SINGLE USE SWABS REGULAR Pads USE 7 SWABS DAILY AS DIRECTED   budesonide-formoterol 160-4.5 MCG/ACT inhaler Commonly known as:  SYMBICORT Inhale 2 puffs into the lungs 2 (two) times daily.   cholecalciferol 1000 units tablet Commonly known as:  VITAMIN D Take 2,000 Units by mouth daily.   famotidine 20 MG tablet Commonly known as:  PEPCID Take 20 mg by mouth at bedtime.   ferrous sulfate 325 (65 FE) MG tablet Take 325 mg by mouth daily with breakfast.   finasteride 5 MG tablet Commonly known as:  PROSCAR TAKE 1 TABLET EVERY DAY   furosemide 20 MG tablet Commonly known as:  LASIX TAKE 1 TABLET EVERY DAY   insulin aspart 100 UNIT/ML injection Commonly known as:  novoLOG Inject 10 Units into the skin 3 (three) times daily before meals. Pt uses 15 units before breakfast, 17 units before lunch, and 17 units before dinner.   insulin glargine 100 UNIT/ML injection Commonly known as:  LANTUS Inject 20 Units into the skin 2 (two) times daily.   levofloxacin 250 MG tablet Commonly known as:   LEVAQUIN Take 1 tablet (250 mg total) by mouth daily at 3 pm.   MELATONIN PO Take 1 tablet by mouth at bedtime.   metroNIDAZOLE 500 MG tablet Commonly known as:  FLAGYL Take 1 tablet (500 mg total) by mouth every 8 (eight) hours.   montelukast 10 MG tablet Commonly known as:  SINGULAIR Take 10 mg by mouth at bedtime.   multivitamin with minerals Tabs tablet Take 1 tablet by mouth daily.   omeprazole 20 MG capsule Commonly known as:  PRILOSEC Take 20 mg by mouth 2 (two) times  daily before a meal.   polyethylene glycol packet Commonly known as:  MIRALAX / GLYCOLAX Take 17 g by mouth 2 (two) times daily.   psyllium 0.52 g capsule Commonly known as:  REGULOID Take 0.52 g by mouth daily.   saccharomyces boulardii 250 MG capsule Commonly known as:  FLORASTOR Take 1 capsule (250 mg total) by mouth 2 (two) times daily.   simvastatin 20 MG tablet Commonly known as:  ZOCOR TAKE 1 TABLET EVERY DAY   TOVIAZ 8 MG Tb24 tablet Generic drug:  fesoterodine Take 8 mg by mouth at bedtime.   traMADol 50 MG tablet Commonly known as:  ULTRAM Take 1 tablet (50 mg total) by mouth every 12 (twelve) hours as needed (breakthrough pain).       Allergies:  Allergies  Allergen Reactions  . Nitrous Oxide Other (See Comments)    Reaction:  Unknown   . Penicillins Other (See Comments)    Reaction:  Unknown Has patient had a PCN reaction causing immediate rash, facial/tongue/throat swelling, SOB or lightheadedness with hypotension: Unsure Has patient had a PCN reaction causing severe rash involving mucus membranes or skin necrosis: Unsure Has patient had a PCN reaction that required hospitalization Unsure  Has patient had a PCN reaction occurring within the last 10 years: No If all of the above answers are "NO", then may proceed with Cephalosporin use.  . Tape Other (See Comments)    Reaction:  Tears pts skin   . Diltiazem Rash    Past Medical History:  Diagnosis Date  . Anemia   .  Chronic renal insufficiency   . DDD (degenerative disc disease), lumbar   . Diabetes mellitus   . Hypertension   . Neuromuscular disorder ([place])   . Osteoporosis     Past Surgical History:  Procedure Laterality Date  . EYE SURGERY    . I&D EXTREMITY Right 08/13/2016   Procedure: IRRIGATION AND DEBRIDEMENT RIGHT ELBOW AND HAND;  Surgeon: Milly Jakob, MD;  Location: [facility];  Service: Orthopedics;  Laterality: Right;  Marland Kitchen VASECTOMY    . VIDEO BRONCHOSCOPY Bilateral 08/25/2012   Procedure: VIDEO BRONCHOSCOPY WITHOUT FLUORO;  Surgeon: Brand Males, MD;  Location: [facility];  Service: Cardiopulmonary;  Laterality: Bilateral;    Family History  Problem Relation Age of Onset  . Diabetes Mellitus II    . Hypertension      Social History:  reports that he quit smoking about 43 years ago. His smoking use included Cigarettes. He has a 99.00 pack-year smoking history. He quit smokeless tobacco use about 41 years ago. He reports that he does not drink alcohol or use drugs.  Review of Systems -   HYPERTENSION:  Currently not on any medication and blood pressure is still normal   He was told to cut back on high potassium foods because of hyperkalemia on the last visit and usually doing this, eating a banana every other day  Lab Results  Component Value Date   CREATININE 2.21 (H) 09/25/2016   BUN 40 (H) 09/25/2016   NA 141 09/25/2016   K 4.2 09/25/2016   CL 107 09/25/2016   CO2 26 09/25/2016     Previous history of anemia related to renal dysfunction Hemoglobin was as low as 9.5 and the hospital and now improved although client saturation is low He says he has been taking iron once a day long-term   Lab Results  Component Value Date   WBC 9.2 09/25/2016   HGB 11.6 (L) 09/25/2016  HCT 35.0 (L) 09/25/2016   MCV 94.7 09/25/2016   PLT 339.0 09/25/2016    He has been Using CPAP and this is being adjusted by pulmonologist.    Edema: Mild and more on the left Taking 20 mg  Lasix, 4 days a week with good control  He did not like wearing the elastic stockings prescribed previously  He has had chronic lower urinary tract problems and BPH.  Prescriptions are being done by his urologist  HYPERLIPIDEMIA:         The lipid abnormality consists of  minimally elevated LDL, taking simvastatin for cardiovascular protection.  Has good levels of LDL but HDL relatively low   Lab Results  Component Value Date   CHOL 130 01/25/2016   HDL 36.70 (L) 01/25/2016   LDLCALC 66 01/25/2016   TRIG 137.0 01/25/2016   CHOLHDL 4 01/25/2016     Diabetic foot exam results in 10/17: normal monofilament sensation in the toes and plantar surfaces, no skin lesions or ulcers on the feet and normal pedal pulses   Examination:   BP 130/62   Pulse 89   Ht '5\' 10"'$  (1.778 m)   Wt 222 lb (100.7 kg)   BMI 31.85 kg/m   Body mass index is 31.85 kg/m.   No ankle edema    Assesment/PLAN:   1. Diabetes type 2 with obesity and On insulin only  See history of present illness for detailed discussion of current diabetes management, blood sugar patterns and problems identifie  The patient's diabetes control is overall  well controlled  A1c has come down to 6% which is lower than usual This is likely to be from his having tendency to hypoglycemia and weight loss over the last month or so Although his current doses lower especially the mealtime insulin he is still sporadically getting a low sugar in the daytime, he thinks his appetite is improved now As before he has not monitored blood sugars after supper to help assess his suppertime dose  Recommendations are to check sugars more often after meals and also reduce LANTUS by at least 2 units twice a day for now Reminded him that his blood sugar does not need to be tightly controlled and may need to avoid hypoglycemia He may need to increase mealtime dose when blood sugars are consistently higher after meals especially at dinnertime However  if he is starting to exercise he may need to adjust his mealtime doses, he generally tries to do this anyway  2.   Chronic kidney disease: His creatinine is overall stable with some recent increase secondary to infectious illness We'll continue to monitor Needs periodic PTH levels  HYPERKALEMIA: Resolved Continue restricting high potassium foods  3.  Hypertension history: Blood pressure is normal without any medication, continue to monitor  4.  History of mild anemia, appears to have been worse recently during hospitalization possibly from acute illness but also Saturation is low He will increase his iron to twice a day for now   5.  Left leg edema is controlled with low-dose Lasix  6.  History of fall, cautioned him to be careful especially when being outdoors   Patient Instructions  Lantus 18 units 2x daily  Take iron 2x daily  Check blood sugars on waking up  daily  Also check blood sugars about 2 hours after a meal and do this after different meals by rotation  Recommended blood sugar levels on waking up is 90-130 and about 2 hours after meal is  130-160  Please bring your blood sugar monitor to each visit, thank you    Counseling time on subjects discussed above is over 50% of today's 25 minute visit   Laddie Naeem  10/03/2016, 8:40 PM

## 2016-10-04 DIAGNOSIS — R531 Weakness: Secondary | ICD-10-CM | POA: Diagnosis not present

## 2016-10-04 DIAGNOSIS — Z9181 History of falling: Secondary | ICD-10-CM | POA: Diagnosis not present

## 2016-10-04 DIAGNOSIS — R262 Difficulty in walking, not elsewhere classified: Secondary | ICD-10-CM | POA: Diagnosis not present

## 2016-10-09 DIAGNOSIS — Z9181 History of falling: Secondary | ICD-10-CM | POA: Diagnosis not present

## 2016-10-09 DIAGNOSIS — R531 Weakness: Secondary | ICD-10-CM | POA: Diagnosis not present

## 2016-10-09 DIAGNOSIS — R262 Difficulty in walking, not elsewhere classified: Secondary | ICD-10-CM | POA: Diagnosis not present

## 2016-10-10 DIAGNOSIS — Z9181 History of falling: Secondary | ICD-10-CM | POA: Diagnosis not present

## 2016-10-10 DIAGNOSIS — R531 Weakness: Secondary | ICD-10-CM | POA: Diagnosis not present

## 2016-10-10 DIAGNOSIS — R262 Difficulty in walking, not elsewhere classified: Secondary | ICD-10-CM | POA: Diagnosis not present

## 2016-10-10 DIAGNOSIS — R05 Cough: Secondary | ICD-10-CM | POA: Diagnosis not present

## 2016-10-11 DIAGNOSIS — Z9181 History of falling: Secondary | ICD-10-CM | POA: Diagnosis not present

## 2016-10-11 DIAGNOSIS — R262 Difficulty in walking, not elsewhere classified: Secondary | ICD-10-CM | POA: Diagnosis not present

## 2016-10-11 DIAGNOSIS — R531 Weakness: Secondary | ICD-10-CM | POA: Diagnosis not present

## 2016-10-15 DIAGNOSIS — Z9181 History of falling: Secondary | ICD-10-CM | POA: Diagnosis not present

## 2016-10-15 DIAGNOSIS — R262 Difficulty in walking, not elsewhere classified: Secondary | ICD-10-CM | POA: Diagnosis not present

## 2016-10-15 DIAGNOSIS — R531 Weakness: Secondary | ICD-10-CM | POA: Diagnosis not present

## 2016-10-18 DIAGNOSIS — R262 Difficulty in walking, not elsewhere classified: Secondary | ICD-10-CM | POA: Diagnosis not present

## 2016-10-18 DIAGNOSIS — R531 Weakness: Secondary | ICD-10-CM | POA: Diagnosis not present

## 2016-10-18 DIAGNOSIS — Z9181 History of falling: Secondary | ICD-10-CM | POA: Diagnosis not present

## 2016-10-22 DIAGNOSIS — R531 Weakness: Secondary | ICD-10-CM | POA: Diagnosis not present

## 2016-10-22 DIAGNOSIS — R262 Difficulty in walking, not elsewhere classified: Secondary | ICD-10-CM | POA: Diagnosis not present

## 2016-10-22 DIAGNOSIS — Z9181 History of falling: Secondary | ICD-10-CM | POA: Diagnosis not present

## 2016-10-25 DIAGNOSIS — Z9181 History of falling: Secondary | ICD-10-CM | POA: Diagnosis not present

## 2016-10-25 DIAGNOSIS — R531 Weakness: Secondary | ICD-10-CM | POA: Diagnosis not present

## 2016-10-25 DIAGNOSIS — R262 Difficulty in walking, not elsewhere classified: Secondary | ICD-10-CM | POA: Diagnosis not present

## 2016-10-28 ENCOUNTER — Other Ambulatory Visit: Payer: Self-pay | Admitting: Endocrinology

## 2016-10-29 DIAGNOSIS — R531 Weakness: Secondary | ICD-10-CM | POA: Diagnosis not present

## 2016-10-29 DIAGNOSIS — Z9181 History of falling: Secondary | ICD-10-CM | POA: Diagnosis not present

## 2016-10-29 DIAGNOSIS — R262 Difficulty in walking, not elsewhere classified: Secondary | ICD-10-CM | POA: Diagnosis not present

## 2016-10-30 ENCOUNTER — Telehealth: Payer: Self-pay | Admitting: Endocrinology

## 2016-10-30 DIAGNOSIS — N183 Chronic kidney disease, stage 3 (moderate): Secondary | ICD-10-CM | POA: Diagnosis not present

## 2016-10-30 DIAGNOSIS — E611 Iron deficiency: Secondary | ICD-10-CM | POA: Insufficient documentation

## 2016-10-30 DIAGNOSIS — R05 Cough: Secondary | ICD-10-CM | POA: Diagnosis not present

## 2016-10-30 DIAGNOSIS — J441 Chronic obstructive pulmonary disease with (acute) exacerbation: Secondary | ICD-10-CM | POA: Diagnosis not present

## 2016-10-30 DIAGNOSIS — J449 Chronic obstructive pulmonary disease, unspecified: Secondary | ICD-10-CM | POA: Diagnosis not present

## 2016-10-30 DIAGNOSIS — R0602 Shortness of breath: Secondary | ICD-10-CM | POA: Diagnosis not present

## 2016-10-30 DIAGNOSIS — R0902 Hypoxemia: Secondary | ICD-10-CM | POA: Diagnosis not present

## 2016-10-30 DIAGNOSIS — R911 Solitary pulmonary nodule: Secondary | ICD-10-CM | POA: Diagnosis not present

## 2016-10-30 DIAGNOSIS — G4733 Obstructive sleep apnea (adult) (pediatric): Secondary | ICD-10-CM | POA: Diagnosis not present

## 2016-10-30 NOTE — Telephone Encounter (Signed)
I ordered.

## 2016-10-30 NOTE — Telephone Encounter (Signed)
Pt called in and wanted to know if he needs to come in and have more labs done due to his iron being low the last time.

## 2016-10-30 NOTE — Telephone Encounter (Signed)
The last time this patient had labs was on 09/25/2016 and his iron was 53 (within normal range). Dr. Dwyane Dee advised to start taking 2 iron pills a day. Patient is concerned with getting another lab on iron before his appointment in July with Dr. Dwyane Dee. Please advise what patient needs to do.

## 2016-11-01 ENCOUNTER — Other Ambulatory Visit: Payer: Self-pay

## 2016-11-01 DIAGNOSIS — R262 Difficulty in walking, not elsewhere classified: Secondary | ICD-10-CM | POA: Diagnosis not present

## 2016-11-01 DIAGNOSIS — Z9181 History of falling: Secondary | ICD-10-CM | POA: Diagnosis not present

## 2016-11-01 DIAGNOSIS — R531 Weakness: Secondary | ICD-10-CM | POA: Diagnosis not present

## 2016-11-01 MED ORDER — INSULIN GLARGINE 100 UNIT/ML ~~LOC~~ SOLN: 20.0000 [IU] | Freq: Two times a day (BID) | SUBCUTANEOUS | 1 refills | Status: DC

## 2016-11-01 NOTE — Telephone Encounter (Signed)
Called patient and spoke to [name], his daughter, and she stated that he needed Lantus sent to [facility]. I have sent this order in for them.

## 2016-11-01 NOTE — Telephone Encounter (Signed)
Refill  Of lancet   Sent to  [facility] [place], [address] AT [facility] OF [address] [phone] (Phone) [phone] (Fax)

## 2016-11-05 DIAGNOSIS — R531 Weakness: Secondary | ICD-10-CM | POA: Diagnosis not present

## 2016-11-05 DIAGNOSIS — Z9181 History of falling: Secondary | ICD-10-CM | POA: Diagnosis not present

## 2016-11-05 DIAGNOSIS — R262 Difficulty in walking, not elsewhere classified: Secondary | ICD-10-CM | POA: Diagnosis not present

## 2016-11-08 DIAGNOSIS — Z9181 History of falling: Secondary | ICD-10-CM | POA: Diagnosis not present

## 2016-11-08 DIAGNOSIS — R262 Difficulty in walking, not elsewhere classified: Secondary | ICD-10-CM | POA: Diagnosis not present

## 2016-11-08 DIAGNOSIS — R531 Weakness: Secondary | ICD-10-CM | POA: Diagnosis not present

## 2016-11-13 DIAGNOSIS — Z9181 History of falling: Secondary | ICD-10-CM | POA: Diagnosis not present

## 2016-11-13 DIAGNOSIS — R531 Weakness: Secondary | ICD-10-CM | POA: Diagnosis not present

## 2016-11-13 DIAGNOSIS — R262 Difficulty in walking, not elsewhere classified: Secondary | ICD-10-CM | POA: Diagnosis not present

## 2016-11-15 DIAGNOSIS — Z9181 History of falling: Secondary | ICD-10-CM | POA: Diagnosis not present

## 2016-11-15 DIAGNOSIS — R531 Weakness: Secondary | ICD-10-CM | POA: Diagnosis not present

## 2016-11-15 DIAGNOSIS — R262 Difficulty in walking, not elsewhere classified: Secondary | ICD-10-CM | POA: Diagnosis not present

## 2016-11-19 DIAGNOSIS — R262 Difficulty in walking, not elsewhere classified: Secondary | ICD-10-CM | POA: Diagnosis not present

## 2016-11-19 DIAGNOSIS — R531 Weakness: Secondary | ICD-10-CM | POA: Diagnosis not present

## 2016-11-19 DIAGNOSIS — Z9181 History of falling: Secondary | ICD-10-CM | POA: Diagnosis not present

## 2016-11-22 DIAGNOSIS — R262 Difficulty in walking, not elsewhere classified: Secondary | ICD-10-CM | POA: Diagnosis not present

## 2016-11-22 DIAGNOSIS — Z9181 History of falling: Secondary | ICD-10-CM | POA: Diagnosis not present

## 2016-11-22 DIAGNOSIS — R531 Weakness: Secondary | ICD-10-CM | POA: Diagnosis not present

## 2016-11-27 DIAGNOSIS — Z9181 History of falling: Secondary | ICD-10-CM | POA: Diagnosis not present

## 2016-11-27 DIAGNOSIS — R531 Weakness: Secondary | ICD-10-CM | POA: Diagnosis not present

## 2016-11-27 DIAGNOSIS — R262 Difficulty in walking, not elsewhere classified: Secondary | ICD-10-CM | POA: Diagnosis not present

## 2016-11-30 DIAGNOSIS — Z9181 History of falling: Secondary | ICD-10-CM | POA: Diagnosis not present

## 2016-11-30 DIAGNOSIS — R262 Difficulty in walking, not elsewhere classified: Secondary | ICD-10-CM | POA: Diagnosis not present

## 2016-11-30 DIAGNOSIS — R531 Weakness: Secondary | ICD-10-CM | POA: Diagnosis not present

## 2016-12-03 DIAGNOSIS — Z9181 History of falling: Secondary | ICD-10-CM | POA: Diagnosis not present

## 2016-12-03 DIAGNOSIS — R262 Difficulty in walking, not elsewhere classified: Secondary | ICD-10-CM | POA: Diagnosis not present

## 2016-12-03 DIAGNOSIS — R531 Weakness: Secondary | ICD-10-CM | POA: Diagnosis not present

## 2016-12-05 ENCOUNTER — Telehealth: Payer: Self-pay | Admitting: Endocrinology

## 2016-12-05 NOTE — Telephone Encounter (Signed)
Patient is requesting furosemide (LASIX) 20 MG tablet [[id number]]   be sent to [facility] mail order

## 2016-12-06 MED ORDER — FUROSEMIDE 20 MG PO TABS: 20.0000 mg | ORAL_TABLET | Freq: Every day | ORAL | 1 refills | Status: DC

## 2016-12-06 NOTE — Telephone Encounter (Signed)
Refill submitted. 

## 2016-12-07 DIAGNOSIS — Z9181 History of falling: Secondary | ICD-10-CM | POA: Diagnosis not present

## 2016-12-07 DIAGNOSIS — R531 Weakness: Secondary | ICD-10-CM | POA: Diagnosis not present

## 2016-12-07 DIAGNOSIS — R262 Difficulty in walking, not elsewhere classified: Secondary | ICD-10-CM | POA: Diagnosis not present

## 2016-12-10 DIAGNOSIS — R531 Weakness: Secondary | ICD-10-CM | POA: Diagnosis not present

## 2016-12-10 DIAGNOSIS — Z9181 History of falling: Secondary | ICD-10-CM | POA: Diagnosis not present

## 2016-12-10 DIAGNOSIS — R262 Difficulty in walking, not elsewhere classified: Secondary | ICD-10-CM | POA: Diagnosis not present

## 2016-12-11 DIAGNOSIS — R911 Solitary pulmonary nodule: Secondary | ICD-10-CM | POA: Diagnosis not present

## 2016-12-11 DIAGNOSIS — G4733 Obstructive sleep apnea (adult) (pediatric): Secondary | ICD-10-CM | POA: Diagnosis not present

## 2016-12-11 DIAGNOSIS — J449 Chronic obstructive pulmonary disease, unspecified: Secondary | ICD-10-CM | POA: Diagnosis not present

## 2016-12-11 DIAGNOSIS — N183 Chronic kidney disease, stage 3 (moderate): Secondary | ICD-10-CM | POA: Diagnosis not present

## 2016-12-11 DIAGNOSIS — R0902 Hypoxemia: Secondary | ICD-10-CM | POA: Diagnosis not present

## 2016-12-14 DIAGNOSIS — Z9181 History of falling: Secondary | ICD-10-CM | POA: Diagnosis not present

## 2016-12-14 DIAGNOSIS — R262 Difficulty in walking, not elsewhere classified: Secondary | ICD-10-CM | POA: Diagnosis not present

## 2016-12-14 DIAGNOSIS — R531 Weakness: Secondary | ICD-10-CM | POA: Diagnosis not present

## 2016-12-14 DIAGNOSIS — R05 Cough: Secondary | ICD-10-CM | POA: Diagnosis not present

## 2016-12-24 DIAGNOSIS — R262 Difficulty in walking, not elsewhere classified: Secondary | ICD-10-CM | POA: Diagnosis not present

## 2016-12-24 DIAGNOSIS — Z9181 History of falling: Secondary | ICD-10-CM | POA: Diagnosis not present

## 2016-12-24 DIAGNOSIS — R531 Weakness: Secondary | ICD-10-CM | POA: Diagnosis not present

## 2016-12-27 ENCOUNTER — Other Ambulatory Visit: Payer: Self-pay | Admitting: Endocrinology

## 2016-12-28 ENCOUNTER — Other Ambulatory Visit (INDEPENDENT_AMBULATORY_CARE_PROVIDER_SITE_OTHER): Payer: Medicare PPO

## 2016-12-28 ENCOUNTER — Telehealth: Payer: Self-pay | Admitting: Endocrinology

## 2016-12-28 DIAGNOSIS — N184 Chronic kidney disease, stage 4 (severe): Secondary | ICD-10-CM

## 2016-12-28 DIAGNOSIS — Z794 Long term (current) use of insulin: Secondary | ICD-10-CM | POA: Diagnosis not present

## 2016-12-28 DIAGNOSIS — E1165 Type 2 diabetes mellitus with hyperglycemia: Secondary | ICD-10-CM

## 2016-12-28 DIAGNOSIS — E611 Iron deficiency: Secondary | ICD-10-CM | POA: Diagnosis not present

## 2016-12-28 LAB — CBC WITH DIFFERENTIAL/PLATELET
BASOS ABS: 0.1 10*3/uL (ref 0.0–0.1)
Basophils Relative: 0.9 % (ref 0.0–3.0)
EOS ABS: 0.4 10*3/uL (ref 0.0–0.7)
Eosinophils Relative: 4.7 % (ref 0.0–5.0)
HCT: 38.9 % — ABNORMAL LOW (ref 39.0–52.0)
Hemoglobin: 12.8 g/dL — ABNORMAL LOW (ref 13.0–17.0)
LYMPHS ABS: 1.6 10*3/uL (ref 0.7–4.0)
Lymphocytes Relative: 18.7 % (ref 12.0–46.0)
MCHC: 32.9 g/dL (ref 30.0–36.0)
MCV: 95.7 fl (ref 78.0–100.0)
Monocytes Absolute: 0.9 10*3/uL (ref 0.1–1.0)
Monocytes Relative: 10.7 % (ref 3.0–12.0)
NEUTROS ABS: 5.6 10*3/uL (ref 1.4–7.7)
NEUTROS PCT: 65 % (ref 43.0–77.0)
PLATELETS: 305 10*3/uL (ref 150.0–400.0)
RBC: 4.07 Mil/uL — ABNORMAL LOW (ref 4.22–5.81)
RDW: 14.2 % (ref 11.5–15.5)
WBC: 8.6 10*3/uL (ref 4.0–10.5)

## 2016-12-28 LAB — LIPID PANEL
CHOL/HDL RATIO: 3
CHOLESTEROL: 124 mg/dL (ref 0–200)
HDL: 39.7 mg/dL (ref >39.00)
LDL CALC: 66 mg/dL (ref 0–99)
NonHDL: 84.7
Triglycerides: 96 mg/dL (ref 0.0–149.0)
VLDL: 19.2 mg/dL (ref 0.0–40.0)

## 2016-12-28 LAB — COMPREHENSIVE METABOLIC PANEL
ALBUMIN: 3.9 g/dL (ref 3.5–5.2)
ALT: 14 U/L (ref 0–53)
AST: 22 U/L (ref 0–37)
Alkaline Phosphatase: 56 U/L (ref 39–117)
BILIRUBIN TOTAL: 0.3 mg/dL (ref 0.2–1.2)
BUN: 51 mg/dL — AB (ref 6–23)
CHLORIDE: 107 meq/L (ref 96–112)
CO2: 25 meq/L (ref 19–32)
CREATININE: 2.52 mg/dL — AB (ref 0.40–1.50)
Calcium: 9.8 mg/dL (ref 8.4–10.5)
GFR: 25.86 mL/min — ABNORMAL LOW (ref >60.00)
Glucose, Bld: 73 mg/dL (ref 70–99)
Potassium: 5 mEq/L (ref 3.5–5.1)
SODIUM: 141 meq/L (ref 135–145)
Total Protein: 7.1 g/dL (ref 6.0–8.3)

## 2016-12-28 LAB — IBC PANEL
IRON: 90 ug/dL (ref 42–165)
SATURATION RATIOS: 30.3 % (ref 20.0–50.0)
TRANSFERRIN: 212 mg/dL (ref 212.0–360.0)

## 2016-12-28 LAB — HEMOGLOBIN A1C: Hgb A1c MFr Bld: 5.9 % (ref 4.6–6.5)

## 2016-12-28 MED ORDER — FINASTERIDE 5 MG PO TABS: 5.0000 mg | ORAL_TABLET | Freq: Every day | ORAL | 1 refills | Status: DC

## 2016-12-28 MED ORDER — INSULIN ASPART 100 UNIT/ML ~~LOC~~ SOLN: SUBCUTANEOUS | 2 refills | Status: DC

## 2016-12-28 NOTE — Telephone Encounter (Signed)
Patient called requesting a refill of novolog and finasteride sent to [facility]

## 2016-12-28 NOTE — Telephone Encounter (Signed)
Refills submitted.  

## 2016-12-29 LAB — PARATHYROID HORMONE, INTACT (NO CA): PTH: 29 pg/mL (ref 15–65)

## 2016-12-31 DIAGNOSIS — Z9181 History of falling: Secondary | ICD-10-CM | POA: Diagnosis not present

## 2016-12-31 DIAGNOSIS — R531 Weakness: Secondary | ICD-10-CM | POA: Diagnosis not present

## 2016-12-31 DIAGNOSIS — R262 Difficulty in walking, not elsewhere classified: Secondary | ICD-10-CM | POA: Diagnosis not present

## 2017-01-01 DIAGNOSIS — S61401D Unspecified open wound of right hand, subsequent encounter: Secondary | ICD-10-CM | POA: Diagnosis not present

## 2017-01-02 ENCOUNTER — Other Ambulatory Visit: Payer: Self-pay

## 2017-01-02 ENCOUNTER — Ambulatory Visit (INDEPENDENT_AMBULATORY_CARE_PROVIDER_SITE_OTHER): Payer: Medicare PPO | Admitting: Endocrinology

## 2017-01-02 ENCOUNTER — Encounter: Payer: Self-pay | Admitting: Endocrinology

## 2017-01-02 VITALS — BP 124/68 | HR 85 | Ht 70.0 in | Wt 222.6 lb

## 2017-01-02 DIAGNOSIS — Z794 Long term (current) use of insulin: Secondary | ICD-10-CM | POA: Diagnosis not present

## 2017-01-02 DIAGNOSIS — D508 Other iron deficiency anemias: Secondary | ICD-10-CM

## 2017-01-02 DIAGNOSIS — T383X5A Adverse effect of insulin and oral hypoglycemic [antidiabetic] drugs, initial encounter: Secondary | ICD-10-CM | POA: Diagnosis not present

## 2017-01-02 DIAGNOSIS — E160 Drug-induced hypoglycemia without coma: Secondary | ICD-10-CM

## 2017-01-02 DIAGNOSIS — M25473 Effusion, unspecified ankle: Secondary | ICD-10-CM

## 2017-01-02 DIAGNOSIS — N184 Chronic kidney disease, stage 4 (severe): Secondary | ICD-10-CM

## 2017-01-02 DIAGNOSIS — E1165 Type 2 diabetes mellitus with hyperglycemia: Secondary | ICD-10-CM | POA: Diagnosis not present

## 2017-01-02 NOTE — Progress Notes (Signed)
Patient ID: Clayton [name], male   DOB: 07-Apr-1928, 83 y.o.   MRN: [id number]   Reason for Appointment: Followup of diabetes and various issues  History of Present Illness   Type 2 DIABETES MELITUS diagnosed 1989  He has had long-standing diabetes and has been on basal bolus insulin for the last few years Since 2014 he has required larger doses of insulin especially with having to take periodic courses of steroids for his pulmonary problems Also his Actos was stopped because of tendency to edema and metformin stopped because of renal dysfunction  Insulin regimen: Novolog 8 units before meals, Lantus  20-20  A1c is lower than usual at 5.9  Current blood sugar patterns and problems identified:  His blood sugars were relatively lower on the last visit also and he was told to reduce his insulin doses  Again his blood sugars are relatively low overall within average overall 99  He is checking blood sugars about 4 times a day  He says he is somewhat more active now  Overall his intake has been less and he has maintained his weight loss  Most of his relatively low sugars are occurring day and afternoon and only rarely below 70 after supper  FASTING blood sugars are not low but once  He has also done some readings later at night which have been sporadically high over 200 based on his intake  He says he is only eating some oatmeal in the morning and he was told by his pulmonologist to cut back on peanut butter and cheese because of high sodium content  Hypoglycemia:  recently as above   Oral hypoglycemic drugs: None           Monitors blood glucose:  3-4 times a day   Glucometer: Accu-Chek        Glucose  monitor download shows the following results  Average reading are for the last 4 weeks  Mean values apply above for all meters except median for One Touch  PRE-MEAL Fasting Lunch Dinner Bedtime Overall  Glucose range:  50-1 14  48-1 25   44-317     Mean/median: 96   89  105  148  99+/-47    POST-MEAL PC Breakfast PC Lunch PC Dinner  Glucose range:     Mean/median:  78      Meals: 3 meals per day.  breakfast: oatmeal / grits, PB Dinner usually at 6:30 pm       Physical activity: exercise: bike daily  Weight history:  Wt Readings from Last 3 Encounters:  01/02/17 222 lb 9.6 oz (101 kg)  10/03/16 222 lb (100.7 kg)  08/15/16 235 lb 0.2 oz (106.6 kg)               Complications are: Nephropathy. Microalbumin level now consistently normal  LABS:  Lab Results  Component Value Date   HGBA1C 5.9 12/28/2016   HGBA1C 6.0 10/03/2016   HGBA1C 7.0 (H) 07/11/2016   Lab Results  Component Value Date   MICROALBUR 25.6 (H) 06/21/2016   LDLCALC 66 12/28/2016   CREATININE 2.52 (H) 12/28/2016     Problem 2:  RENAL insufficiency: He has had chronic increase in creatinine, was 1.7 in 2014. This has been related to nephropathy and glomerulosclerosis ACE inhibitor/ARB recommended by nephrologist but not done because of the history of patient getting hyperkalemia with these drugs in the past Renal ultrasound did not show any abnormality  Creatinine is overall Trending higher,  highest 2.6 previously He was told to take more Lasix by his pulmonologist possibly because of edema but recently has not had any significant edema and is taking Lasix once a day only, previously taking 4 days a week  He has a normal PTH, not on calcitriol and only on vitamin D He does not want to follow-up with nephrologist   Lab Results  Component Value Date   CREATININE 2.52 (H) 12/28/2016   CREATININE 2.21 (H) 09/25/2016   CREATININE 1.93 (H) 08/16/2016   CREATININE 2.07 (H) 08/15/2016    Proteinuria: He was given diltiazem previously For his proteinuria  when blood pressure was relatively high  but he claims that it was causing itching on his abdominal area Currently has mild increase in microalbumin    Allergies as of 01/02/2017      Reactions   Levaquin  [levofloxacin In D5w]    Caused chest pain and heartburn   Nitrous Oxide Other (See Comments)   Reaction:  Unknown    Penicillins Other (See Comments)   Reaction:  Unknown Has patient had a PCN reaction causing immediate rash, facial/tongue/throat swelling, SOB or lightheadedness with hypotension: Unsure Has patient had a PCN reaction causing severe rash involving mucus membranes or skin necrosis: Unsure Has patient had a PCN reaction that required hospitalization Unsure  Has patient had a PCN reaction occurring within the last 10 years: No If all of the above answers are "NO", then may proceed with Cephalosporin use.   Tape Other (See Comments)   Reaction:  Tears pts skin    Diltiazem Rash      Medication List       Accurate as of 01/02/17  9:20 PM. Always use your most recent med list.          albuterol 108 (90 Base) MCG/ACT inhaler Commonly known as:  PROVENTIL HFA;VENTOLIN HFA Inhale 1-2 puffs into the lungs every 6 (six) hours as needed for wheezing or shortness of breath.   azithromycin 250 MG tablet Commonly known as:  ZITHROMAX Take 250 mg by mouth daily. Takes after dinner   B-D SINGLE USE SWABS REGULAR Pads USE 7 SWABS DAILY AS DIRECTED   BD INSULIN SYRINGE ULTRAFINE 31G X 5/16" 0.3 ML Misc Generic drug:  Insulin Syringe-Needle U-100 USE THREE TIMES DAILY   budesonide-formoterol 160-4.5 MCG/ACT inhaler Commonly known as:  SYMBICORT Inhale 2 puffs into the lungs 2 (two) times daily.   cholecalciferol 1000 units tablet Commonly known as:  VITAMIN D Take 2,000 Units by mouth daily.   diphenhydramine-acetaminophen 25-500 MG Tabs tablet Commonly known as:  TYLENOL PM Take 2 tablets by mouth at bedtime as needed.   famotidine 20 MG tablet Commonly known as:  PEPCID Take 20 mg by mouth at bedtime.   ferrous sulfate 325 (65 FE) MG tablet Take 325 mg by mouth daily with breakfast.   finasteride 5 MG tablet Commonly known as:  PROSCAR Take 1 tablet (5 mg  total) by mouth daily.   furosemide 20 MG tablet Commonly known as:  LASIX Take 1 tablet (20 mg total) by mouth daily.   insulin aspart 100 UNIT/ML injection Commonly known as:  novoLOG Pt uses 15 units before breakfast, 17 units before lunch, and 17 units before dinner.   insulin glargine 100 UNIT/ML injection Commonly known as:  LANTUS Inject 0.2 mLs (20 Units total) into the skin 2 (two) times daily.   MELATONIN PO Take 1 tablet by mouth at  bedtime.   montelukast 10 MG tablet Commonly known as:  SINGULAIR Take 10 mg by mouth at bedtime.   multivitamin with minerals Tabs tablet Take 1 tablet by mouth daily.   omeprazole 20 MG capsule Commonly known as:  PRILOSEC Take 20 mg by mouth 2 (two) times daily before a meal.   psyllium 0.52 g capsule Commonly known as:  REGULOID Take 0.52 g by mouth daily.   simvastatin 20 MG tablet Commonly known as:  ZOCOR TAKE 1 TABLET EVERY DAY   TOVIAZ 8 MG Tb24 tablet Generic drug:  fesoterodine Take 8 mg by mouth at bedtime.       Allergies:  Allergies  Allergen Reactions  . Levaquin [Levofloxacin In D5w]     Caused chest pain and heartburn  . Nitrous Oxide Other (See Comments)    Reaction:  Unknown   . Penicillins Other (See Comments)    Reaction:  Unknown Has patient had a PCN reaction causing immediate rash, facial/tongue/throat swelling, SOB or lightheadedness with hypotension: Unsure Has patient had a PCN reaction causing severe rash involving mucus membranes or skin necrosis: Unsure Has patient had a PCN reaction that required hospitalization Unsure  Has patient had a PCN reaction occurring within the last 10 years: No If all of the above answers are "NO", then may proceed with Cephalosporin use.  . Tape Other (See Comments)    Reaction:  Tears pts skin   . Diltiazem Rash    Past Medical History:  Diagnosis Date  . Anemia   . Chronic renal insufficiency   . DDD (degenerative disc disease), lumbar   . Diabetes  mellitus   . Hypertension   . Neuromuscular disorder ([place])   . Osteoporosis     Past Surgical History:  Procedure Laterality Date  . EYE SURGERY    . I&D EXTREMITY Right 08/13/2016   Procedure: IRRIGATION AND DEBRIDEMENT RIGHT ELBOW AND HAND;  Surgeon: Milly Jakob, MD;  Location: [facility];  Service: Orthopedics;  Laterality: Right;  Marland Kitchen VASECTOMY    . VIDEO BRONCHOSCOPY Bilateral 08/25/2012   Procedure: VIDEO BRONCHOSCOPY WITHOUT FLUORO;  Surgeon: Brand Males, MD;  Location: [facility];  Service: Cardiopulmonary;  Laterality: Bilateral;    Family History  Problem Relation Age of Onset  . Diabetes Mellitus II Unknown   . Hypertension Unknown     Social History:  reports that he quit smoking about 43 years ago. His smoking use included Cigarettes. He has a 99.00 pack-year smoking history. He quit smokeless tobacco use about 41 years ago. He reports that he does not drink alcohol or use drugs.  Review of Systems    HYPERTENSION:   Currently not on any medication and blood pressure is still normal  Blood pressure appears to be relatively lower standing up today  BP Readings from Last 3 Encounters:  01/02/17 124/68  10/03/16 130/62  08/16/16 132/60    HYPERKALEMIA:  He was told to cut back on high potassium foods because of hyperkalemia  His potassium is still high normal He is still not following instructions and the information given, still eating a banana every other day and recently some cantaloupes  Lab Results  Component Value Date   K 5.0 12/28/2016     Previous history of anemia related to renal dysfunction Hemoglobin was as low as 9.5 and the hospital and now improved Progressively He was continued on iron Now his signs saturation is 30 and his hemoglobin is nearly normal   Lab Results  Component  Value Date   WBC 8.6 12/28/2016   HGB 12.8 (L) 12/28/2016   HCT 38.9 (L) 12/28/2016   MCV 95.7 12/28/2016   PLT 305.0 12/28/2016    He has been Using  CPAP and this is being adjusted by pulmonologist.    He has had chronic lower urinary tract problems and BPH.  Prescriptions are being done by his urologist  HYPERLIPIDEMIA:         The lipid abnormality consists of  minimally elevated LDL, taking simvastatin for cardiovascular protection.  Has good levels of LDL but HDL relatively low   Lab Results  Component Value Date   CHOL 124 12/28/2016   HDL 39.70 12/28/2016   LDLCALC 66 12/28/2016   TRIG 96.0 12/28/2016   CHOLHDL 3 12/28/2016     Diabetic foot exam results in 10/17: normal monofilament sensation in the toes and plantar surfaces, no skin lesions or ulcers on the feet and normal pedal pulses   Examination:   BP 124/68 (Patient Position: Standing)   Pulse 85   Ht 5\' 10"  (1.778 m)   Wt 222 lb 9.6 oz (101 kg)   SpO2 95%   BMI 31.94 kg/m   Body mass index is 31.94 kg/m.   1+ ankle edema on the right   Initial blood pressure in office = 140/84  Assesment/PLAN:   1. Diabetes type 2 with obesity and On insulin only  See history of present illness for detailed discussion of current diabetes management, blood sugar patterns and problems identifie  The patient's diabetes control is overall  well controlled and A1c is 5.9  However he is having regarded tendency to hypoglycemia during the day with blood sugars as low as 44 This may be likely related to his reduced intake, lower weight more recently and also somewhat worsened renal function He is a little more active also  He has not cut back any further on his insulin despite low sugars cut back  Recommendations are to reduce insulin by 2 units across the board and further needed Continue the same dose at suppertime as sugars are not low after supper unless eating a very small meal He will call if he has any further hypoglycemia Discussed that he needs to have protein with every meal especially breakfast and he has no contraindication to having peanut butter with his  oatmeal  2.   Chronic kidney disease: His creatinine is somewhat worse possibly from increased use of diuretics recommended by pulmonologist over the last couple months No evidence of secondary hyperparathyroidism  HYPERKALEMIA: Resolved but potassium is high normal Encouraged him to start restricting high potassium foods consistently and discussed various types of foods  3.  Hypertension history: Blood pressure is normal without any medication, continue to monitor However since he has low normal blood pressure standing he needs to take Lasix only 5 days a week  4.  History of mild anemia, nearly resolved with hemoglobin 12.8 now Saturation is up to 30% He will stop iron next month   5.  Left leg edema is controlled with low-dose Lasix  6.  Chronic lung disease/bronchiectasis: Symptoms improved.  Continue follow-up with pulmonologist Records from pulmonologist reviewed  Patient Instructions  Lasix: leave off on Mondays and Thursdays  Only 1/2 banana and no cantoulepes  Ok peanut butter/egg at [employer]  Reduce all doses except supper Novolog by 2 units   Total visit time for evaluation and management of multiple problems, medications, counseling, Labs = 25 minutes  Valene Villa  01/02/2017, 9:20 PM

## 2017-01-02 NOTE — Patient Instructions (Addendum)
Lasix: leave off on Mondays and Thursdays  Only 1/2 banana and no cantoulepes  Ok peanut butter/egg at [employer]  Reduce all doses except supper Novolog by 2 units

## 2017-01-14 ENCOUNTER — Telehealth: Payer: Self-pay | Admitting: Endocrinology

## 2017-01-14 ENCOUNTER — Other Ambulatory Visit: Payer: Self-pay

## 2017-01-14 MED ORDER — FINASTERIDE 5 MG PO TABS: 5.0000 mg | ORAL_TABLET | Freq: Every day | ORAL | 1 refills | Status: DC

## 2017-01-14 NOTE — Telephone Encounter (Signed)
**  Remind patient they can make refill requests via MyChart**  Medication refill request (Name & Dosage): finasteride (PROSCAR) 5 MG tablet [[id number]]    Preferred pharmacy (Name & Address):  [place], [place] [phone] (Phone) [phone] (Fax)      Other comments (if applicable):    90 day supply

## 2017-01-14 NOTE — Telephone Encounter (Signed)
Called patient and spoke to his daughter Angelene Giovanni and let her know that I have sent in a new prescription for the Proscar to [facility] for 90 day supply.

## 2017-01-22 DIAGNOSIS — R0602 Shortness of breath: Secondary | ICD-10-CM | POA: Diagnosis not present

## 2017-01-22 DIAGNOSIS — J449 Chronic obstructive pulmonary disease, unspecified: Secondary | ICD-10-CM | POA: Diagnosis not present

## 2017-01-22 DIAGNOSIS — J479 Bronchiectasis, uncomplicated: Secondary | ICD-10-CM | POA: Diagnosis not present

## 2017-01-22 DIAGNOSIS — G4733 Obstructive sleep apnea (adult) (pediatric): Secondary | ICD-10-CM | POA: Diagnosis not present

## 2017-01-24 DIAGNOSIS — J479 Bronchiectasis, uncomplicated: Secondary | ICD-10-CM | POA: Diagnosis not present

## 2017-01-28 DIAGNOSIS — R05 Cough: Secondary | ICD-10-CM | POA: Diagnosis not present

## 2017-01-29 DIAGNOSIS — L821 Other seborrheic keratosis: Secondary | ICD-10-CM | POA: Diagnosis not present

## 2017-01-29 DIAGNOSIS — L570 Actinic keratosis: Secondary | ICD-10-CM | POA: Diagnosis not present

## 2017-01-29 DIAGNOSIS — D225 Melanocytic nevi of trunk: Secondary | ICD-10-CM | POA: Diagnosis not present

## 2017-01-29 DIAGNOSIS — X32XXXD Exposure to sunlight, subsequent encounter: Secondary | ICD-10-CM | POA: Diagnosis not present

## 2017-02-20 DIAGNOSIS — E1121 Type 2 diabetes mellitus with diabetic nephropathy: Secondary | ICD-10-CM | POA: Diagnosis not present

## 2017-02-20 DIAGNOSIS — I10 Essential (primary) hypertension: Secondary | ICD-10-CM | POA: Diagnosis not present

## 2017-02-20 DIAGNOSIS — N183 Chronic kidney disease, stage 3 (moderate): Secondary | ICD-10-CM | POA: Diagnosis not present

## 2017-02-20 DIAGNOSIS — Z79899 Other long term (current) drug therapy: Secondary | ICD-10-CM | POA: Diagnosis not present

## 2017-02-20 DIAGNOSIS — J449 Chronic obstructive pulmonary disease, unspecified: Secondary | ICD-10-CM | POA: Diagnosis not present

## 2017-02-20 DIAGNOSIS — D631 Anemia in chronic kidney disease: Secondary | ICD-10-CM | POA: Diagnosis not present

## 2017-02-20 DIAGNOSIS — Z0001 Encounter for general adult medical examination with abnormal findings: Secondary | ICD-10-CM | POA: Diagnosis not present

## 2017-02-20 DIAGNOSIS — E7800 Pure hypercholesterolemia, unspecified: Secondary | ICD-10-CM | POA: Diagnosis not present

## 2017-02-21 DIAGNOSIS — R262 Difficulty in walking, not elsewhere classified: Secondary | ICD-10-CM | POA: Diagnosis not present

## 2017-02-21 DIAGNOSIS — R531 Weakness: Secondary | ICD-10-CM | POA: Diagnosis not present

## 2017-02-21 DIAGNOSIS — Z9181 History of falling: Secondary | ICD-10-CM | POA: Diagnosis not present

## 2017-03-04 ENCOUNTER — Other Ambulatory Visit: Payer: Self-pay | Admitting: Endocrinology

## 2017-03-13 ENCOUNTER — Other Ambulatory Visit: Payer: Self-pay | Admitting: Endocrinology

## 2017-03-13 DIAGNOSIS — Z23 Encounter for immunization: Secondary | ICD-10-CM | POA: Diagnosis not present

## 2017-03-18 DIAGNOSIS — H43813 Vitreous degeneration, bilateral: Secondary | ICD-10-CM | POA: Diagnosis not present

## 2017-03-18 DIAGNOSIS — H524 Presbyopia: Secondary | ICD-10-CM | POA: Diagnosis not present

## 2017-03-18 DIAGNOSIS — H04123 Dry eye syndrome of bilateral lacrimal glands: Secondary | ICD-10-CM | POA: Diagnosis not present

## 2017-03-18 DIAGNOSIS — E113293 Type 2 diabetes mellitus with mild nonproliferative diabetic retinopathy without macular edema, bilateral: Secondary | ICD-10-CM | POA: Diagnosis not present

## 2017-03-18 LAB — HM DIABETES EYE EXAM

## 2017-04-01 ENCOUNTER — Other Ambulatory Visit (INDEPENDENT_AMBULATORY_CARE_PROVIDER_SITE_OTHER): Payer: Medicare PPO

## 2017-04-01 DIAGNOSIS — Z794 Long term (current) use of insulin: Secondary | ICD-10-CM

## 2017-04-01 DIAGNOSIS — N184 Chronic kidney disease, stage 4 (severe): Secondary | ICD-10-CM | POA: Diagnosis not present

## 2017-04-01 DIAGNOSIS — E1165 Type 2 diabetes mellitus with hyperglycemia: Secondary | ICD-10-CM | POA: Diagnosis not present

## 2017-04-01 LAB — COMPREHENSIVE METABOLIC PANEL
ALBUMIN: 4 g/dL (ref 3.5–5.2)
ALT: 17 U/L (ref 0–53)
AST: 25 U/L (ref 0–37)
Alkaline Phosphatase: 68 U/L (ref 39–117)
BUN: 41 mg/dL — ABNORMAL HIGH (ref 6–23)
CALCIUM: 9.1 mg/dL (ref 8.4–10.5)
CHLORIDE: 104 meq/L (ref 96–112)
CO2: 23 mEq/L (ref 19–32)
Creatinine, Ser: 2.46 mg/dL — ABNORMAL HIGH (ref 0.40–1.50)
GFR: 26.58 mL/min — ABNORMAL LOW (ref >60.00)
Glucose, Bld: 152 mg/dL — ABNORMAL HIGH (ref 70–99)
POTASSIUM: 4.3 meq/L (ref 3.5–5.1)
Sodium: 138 mEq/L (ref 135–145)
Total Bilirubin: 0.4 mg/dL (ref 0.2–1.2)
Total Protein: 6.9 g/dL (ref 6.0–8.3)

## 2017-04-01 LAB — CBC
HEMATOCRIT: 34.5 % — AB (ref 39.0–52.0)
HEMOGLOBIN: 11.6 g/dL — AB (ref 13.0–17.0)
MCHC: 33.6 g/dL (ref 30.0–36.0)
MCV: 98.5 fl (ref 78.0–100.0)
PLATELETS: 279 10*3/uL (ref 150.0–400.0)
RBC: 3.5 Mil/uL — AB (ref 4.22–5.81)
RDW: 13.9 % (ref 11.5–15.5)
WBC: 8.5 10*3/uL (ref 4.0–10.5)

## 2017-04-01 LAB — HEMOGLOBIN A1C: Hgb A1c MFr Bld: 6.4 % (ref 4.6–6.5)

## 2017-04-05 ENCOUNTER — Other Ambulatory Visit: Payer: Medicare PPO

## 2017-04-10 ENCOUNTER — Ambulatory Visit (INDEPENDENT_AMBULATORY_CARE_PROVIDER_SITE_OTHER): Payer: Medicare PPO | Admitting: Endocrinology

## 2017-04-10 ENCOUNTER — Encounter: Payer: Self-pay | Admitting: Endocrinology

## 2017-04-10 VITALS — BP 138/80 | HR 97 | Ht 70.0 in | Wt 227.8 lb

## 2017-04-10 DIAGNOSIS — D508 Other iron deficiency anemias: Secondary | ICD-10-CM | POA: Diagnosis not present

## 2017-04-10 DIAGNOSIS — N184 Chronic kidney disease, stage 4 (severe): Secondary | ICD-10-CM | POA: Diagnosis not present

## 2017-04-10 DIAGNOSIS — Z794 Long term (current) use of insulin: Secondary | ICD-10-CM

## 2017-04-10 DIAGNOSIS — E1165 Type 2 diabetes mellitus with hyperglycemia: Secondary | ICD-10-CM | POA: Diagnosis not present

## 2017-04-10 DIAGNOSIS — M25473 Effusion, unspecified ankle: Secondary | ICD-10-CM

## 2017-04-10 DIAGNOSIS — Z23 Encounter for immunization: Secondary | ICD-10-CM

## 2017-04-10 NOTE — Progress Notes (Signed)
Patient ID: Clayton [name], male   DOB: [birth year], 83 y.o.   MRN: [id number]   Reason for Appointment: Followup of diabetes and various issues  History of Present Illness   Type 2 DIABETES MELITUS diagnosed 1989  He has had long-standing diabetes and has been on basal bolus insulin for the last few years Since 2014 he has required larger doses of insulin especially with having to take periodic courses of steroids for his pulmonary problems Also his Actos was stopped because of tendency to edema and metformin stopped because of renal dysfunction  Insulin regimen: Novolog 8 units before meals, Lantus  16-, I6 using syringes  A1c is 6.4, previously 5.9  Current blood sugar patterns and problems identified:  His blood sugars are lower on an average compared to his last visit in the morning but overall slightly higher  Previously was having significantly  low blood sugars during the day and his Lantus was reduced, previously on 20 units twice a day  were relatively lower on the last visit also and he was told to reduce his insulin doses  Again his blood sugars are relatively low overall within average overall 99  He is checking blood sugars very consistently but usually only before meals  He does have sporadic HYPOGLYCEMIA during the day but most of his blood sugars are below 70 and in the mornings   Overall his intake has been little more in his weight has come back up, usually trying to have healthy meals  FASTING blood sugars are more frequently low including sometimes at 5 AM, recently has had low sugars at least 5 times in the last 3 weeks in the morning  He does exercise in the morning and does not take any Novolog when he does this   Hypoglycemia:  recently as above   Oral hypoglycemic drugs: None           Monitors blood glucose:  3-4 times a day   Glucometer: Accu-Chek        Glucose  monitor download shows the following results  Average reading are for  the last 4 weeks  Mean values apply above for all meters except median for One Touch  PRE-MEAL Fasting Lunch Dinner Bedtime Overall  Glucose range:  57-1 28  49-1 74  64-1 73  117-174  49-267   Mean/median: 82  124  118   106     Meals: 3 meals per day.  breakfast: oatmeal / grits, PB Dinner usually at 6:30 pm       Physical activity: exercise: bike daily  Weight history:  Wt Readings from Last 3 Encounters:  04/10/17 227 lb 12.8 oz (103.3 kg)  01/02/17 222 lb 9.6 oz (101 kg)  10/03/16 222 lb (100.7 kg)               Complications are: Nephropathy. Microalbumin level now consistently normal  LABS:  Lab Results  Component Value Date   HGBA1C 6.4 04/01/2017   HGBA1C 5.9 12/28/2016   HGBA1C 6.0 10/03/2016   Lab Results  Component Value Date   MICROALBUR 25.6 (H) 06/21/2016   LDLCALC 66 12/28/2016   CREATININE 2.46 (H) 04/01/2017     Problem 2:  RENAL insufficiency: He has had chronic increase in creatinine, was 1.7 in 2014. This has been related to nephropathy and glomerulosclerosis ACE inhibitor/ARB recommended by nephrologist but not done because of the history of patient getting hyperkalemia with these  drugs in the past Renal ultrasound did not show any abnormality  Creatinine is overall Variable and not consistently high compared to earlier this year and highest level has been about 2.6  EDEMA: He is taking his Lasix 20 mg about 5 days a week He doesn't think his edema is worse and it do not take it today  He has a normal PTH, not on calcitriol and only on vitamin D He does not want to follow-up with nephrologist   Lab Results  Component Value Date   CREATININE 2.46 (H) 04/01/2017   CREATININE 2.52 (H) 12/28/2016   CREATININE 2.21 (H) 09/25/2016   CREATININE 1.93 (H) 08/16/2016    Proteinuria: He was given diltiazem previously For his proteinuria  when blood pressure was relatively high  but he claims that it was causing itching on his abdominal  area     Allergies as of 04/10/2017      Reactions   Levaquin [levofloxacin In D5w]    Caused chest pain and heartburn   Nitrous Oxide Other (See Comments)   Reaction:  Unknown    Penicillins Other (See Comments)   Reaction:  Unknown Has patient had a PCN reaction causing immediate rash, facial/tongue/throat swelling, SOB or lightheadedness with hypotension: Unsure Has patient had a PCN reaction causing severe rash involving mucus membranes or skin necrosis: Unsure Has patient had a PCN reaction that required hospitalization Unsure  Has patient had a PCN reaction occurring within the last 10 years: No If all of the above answers are "NO", then may proceed with Cephalosporin use.   Tape Other (See Comments)   Reaction:  Tears pts skin    Diltiazem Rash      Medication List       Accurate as of 04/10/17  3:56 PM. Always use your most recent med list.          albuterol 108 (90 Base) MCG/ACT inhaler Commonly known as:  PROVENTIL HFA;VENTOLIN HFA Inhale 1-2 puffs into the lungs every 6 (six) hours as needed for wheezing or shortness of breath.   azithromycin 250 MG tablet Commonly known as:  ZITHROMAX Take 250 mg by mouth daily. Takes after dinner   B-D SINGLE USE SWABS REGULAR Pads USE 7 SWABS DAILY AS DIRECTED   BD INSULIN SYRINGE ULTRAFINE 31G X 5/16" 0.3 ML Misc Generic drug:  Insulin Syringe-Needle U-100 USE THREE TIMES DAILY   budesonide-formoterol 160-4.5 MCG/ACT inhaler Commonly known as:  SYMBICORT Inhale 2 puffs into the lungs 2 (two) times daily.   cholecalciferol 1000 units tablet Commonly known as:  VITAMIN D Take 2,000 Units by mouth daily.   diphenhydramine-acetaminophen 25-500 MG Tabs tablet Commonly known as:  TYLENOL PM Take 2 tablets by mouth at bedtime as needed.   famotidine 20 MG tablet Commonly known as:  PEPCID Take 20 mg by mouth at bedtime.   ferrous sulfate 325 (65 FE) MG tablet Take 325 mg by mouth daily with breakfast.    finasteride 5 MG tablet Commonly known as:  PROSCAR Take 1 tablet (5 mg total) by mouth daily.   furosemide 20 MG tablet Commonly known as:  LASIX Take 1 tablet (20 mg total) by mouth daily.   insulin aspart 100 UNIT/ML injection Commonly known as:  novoLOG Pt uses 15 units before breakfast, 17 units before lunch, and 17 units before dinner.   LANTUS 100 UNIT/ML injection Generic drug:  insulin glargine INJECT 24 UNITS SUBCUTANEOUSLY IN THE MORNING AND 20 UNITS IN THE EVENING  MELATONIN PO Take 1 tablet by mouth at bedtime.   montelukast 10 MG tablet Commonly known as:  SINGULAIR Take 10 mg by mouth at bedtime.   multivitamin with minerals Tabs tablet Take 1 tablet by mouth daily.   omeprazole 20 MG capsule Commonly known as:  PRILOSEC Take 20 mg by mouth 2 (two) times daily before a meal.   psyllium 0.52 g capsule Commonly known as:  REGULOID Take 0.52 g by mouth daily.   simvastatin 20 MG tablet Commonly known as:  ZOCOR TAKE 1 TABLET EVERY DAY   TOVIAZ 8 MG Tb24 tablet Generic drug:  fesoterodine Take 8 mg by mouth at bedtime.       Allergies:  Allergies  Allergen Reactions  . Levaquin [Levofloxacin In D5w]     Caused chest pain and heartburn  . Nitrous Oxide Other (See Comments)    Reaction:  Unknown   . Penicillins Other (See Comments)    Reaction:  Unknown Has patient had a PCN reaction causing immediate rash, facial/tongue/throat swelling, SOB or lightheadedness with hypotension: Unsure Has patient had a PCN reaction causing severe rash involving mucus membranes or skin necrosis: Unsure Has patient had a PCN reaction that required hospitalization Unsure  Has patient had a PCN reaction occurring within the last 10 years: No If all of the above answers are "NO", then may proceed with Cephalosporin use.  . Tape Other (See Comments)    Reaction:  Tears pts skin   . Diltiazem Rash    Past Medical History:  Diagnosis Date  . Anemia   . Chronic  renal insufficiency   . DDD (degenerative disc disease), lumbar   . Diabetes mellitus   . Hypertension   . Neuromuscular disorder ([place])   . Osteoporosis     Past Surgical History:  Procedure Laterality Date  . EYE SURGERY    . I&D EXTREMITY Right 08/13/2016   Procedure: IRRIGATION AND DEBRIDEMENT RIGHT ELBOW AND HAND;  Surgeon: Milly Jakob, MD;  Location: [facility];  Service: Orthopedics;  Laterality: Right;  Marland Kitchen VASECTOMY    . VIDEO BRONCHOSCOPY Bilateral 08/25/2012   Procedure: VIDEO BRONCHOSCOPY WITHOUT FLUORO;  Surgeon: Brand Males, MD;  Location: [facility];  Service: Cardiopulmonary;  Laterality: Bilateral;    Family History  Problem Relation Age of Onset  . Diabetes Mellitus II Unknown   . Hypertension Unknown     Social History:  reports that he quit smoking about 43 years ago. His smoking use included Cigarettes. He has a 99.00 pack-year smoking history. He quit smokeless tobacco use about 41 years ago. He reports that he does not drink alcohol or use drugs.  Review of Systems    HYPERTENSION:   Currently not on any medication and blood pressure is still normal  Blood pressure appears to be relatively lower standing up today  BP Readings from Last 3 Encounters:  04/10/17 138/80  01/02/17 124/68  10/03/16 130/62    HYPERKALEMIA:  He was told to cut back on high potassium foods because of hyperkalemia  His potassium is still high normal He is still not following instructions and the information given, still eating a banana every other day and recently some cantaloupes  Lab Results  Component Value Date   K 4.3 04/01/2017     Previous history of anemia related to renal dysfunction Hemoglobin was as low as 9.5 and the hospital and now improved Progressively He was Previously on iron and since hemoglobin was 12.8 this was stopped last time,  iron saturation previously 30% but has been as low as 17% Hemoglobin has dropped relatively compared to his last  visit   Lab Results  Component Value Date   WBC 8.5 04/01/2017   HGB 11.6 (L) 04/01/2017   HCT 34.5 (L) 04/01/2017   MCV 98.5 04/01/2017   PLT 279.0 04/01/2017    He has been Using CPAP and this is being adjusted by pulmonologist.    He has had chronic lower urinary tract problems and BPH.  Prescriptions are being done by his urologist  HYPERLIPIDEMIA:         The lipid abnormality consists of  minimally elevated LDL, taking simvastatin for cardiovascular protection.  Has good levels of LDL and triglycerides   Lab Results  Component Value Date   CHOL 124 12/28/2016   HDL 39.70 12/28/2016   LDLCALC 66 12/28/2016   TRIG 96.0 12/28/2016   CHOLHDL 3 12/28/2016    No symptoms of  neuropathy such as pain or paresthesia   Diabetic foot exam results in 10/18: normal monofilament sensation in the toes and plantar surfaces, no skin lesions or ulcers on the feet and normal pedal pulses   Examination:   BP 138/80   Pulse 97   Ht 5\' 10"  (1.778 m)   Wt 227 lb 12.8 oz (103.3 kg)   SpO2 97%   BMI 32.69 kg/m   Body mass index is 32.69 kg/m.   He has 2+ lower leg edema  Diabetic Foot Exam - Simple   Simple Foot Form Diabetic Foot exam was performed with the following findings:  Yes   Visual Inspection No deformities, no ulcerations, no other skin breakdown bilaterally:  Yes Sensation Testing Intact to touch and monofilament testing bilaterally:  Yes Pulse Check Posterior Tibialis and Dorsalis pulse intact bilaterally:  Yes Comments      Assesment/PLAN:   1. Diabetes type 2 with obesity and On insulin only  See history of present illness for detailed discussion of current diabetes management, blood sugar patterns and problems identifie  The patient's diabetes control is overall  well controlled and A1c is  6.4, previously 5.9  He is taking Lantus twice a day and small doses of mealtime insulin, usually consistent with this Also not going off his diet very much  although weight has come back up now He is trying to be active and exercising and will not take his NovoLog in the morning when very active However his blood sugars are still tending to be low especially early morning   Since he is having lower sugars in the morning he may not need Lantus twice a day and probably can do this once a day He does not want to use a insulin pen otherwise would be a good candidate for TOUJEO His new dose will be 28 units in the morning for after exercise, current total is 32 units For now no change in mealtime insulin  He will call if he has any further hypoglycemia Discussed that he needs to have protein with every meal especially breakfast and he has no contraindication to having peanut butter with his oatmeal  2.   Chronic kidney disease: His creatinine is  overall fairly stable No history  of secondary hyperparathyroidism   HYPERKALEMIA: Resolved  3.  Hypertension history: Blood pressure is normal without any medication, continue to monitor Probably have a better readings with increasing Lasix and getting rid of his edema   4.  History of mild anemia,  appears to be  worse again with stopping his iron  Iron tablets to be started now Follow-up in 3 months  5.  EDEMA is worse and he will need to take 40 mg Lasix 5 days a week  6.  Chronic lung disease/bronchiectasis: .  Continue follow-up with pulmonologist  7.  Patient is interested in the shingles vaccine and this was given today  Patient Instructions  Take 40mg  Lasix, 5 days a week  LANTUS: NONE TONIGHT THEN 28 Units once after am exercise  Restart iron    Total visit time for evaluation and management of multiple problems, medications, counseling, Labs = 25 minutes     Sina Sumpter  04/10/2017, 3:56 PM

## 2017-04-10 NOTE — Patient Instructions (Addendum)
Take 40mg  Lasix, 5 days a week  LANTUS: NONE TONIGHT THEN 28 Units once after am exercise  Restart iron

## 2017-04-15 DIAGNOSIS — J209 Acute bronchitis, unspecified: Secondary | ICD-10-CM | POA: Diagnosis not present

## 2017-04-22 ENCOUNTER — Telehealth: Payer: Self-pay | Admitting: Endocrinology

## 2017-04-22 NOTE — Telephone Encounter (Signed)
FYI patient stated he is allergic to Doxycycline. Please put in his chart

## 2017-04-22 NOTE — Telephone Encounter (Signed)
I have added this allergy to his list.

## 2017-05-15 DIAGNOSIS — R350 Frequency of micturition: Secondary | ICD-10-CM | POA: Diagnosis not present

## 2017-05-15 DIAGNOSIS — N3941 Urge incontinence: Secondary | ICD-10-CM | POA: Diagnosis not present

## 2017-05-21 DIAGNOSIS — X32XXXD Exposure to sunlight, subsequent encounter: Secondary | ICD-10-CM | POA: Diagnosis not present

## 2017-05-21 DIAGNOSIS — C44311 Basal cell carcinoma of skin of nose: Secondary | ICD-10-CM | POA: Diagnosis not present

## 2017-05-21 DIAGNOSIS — L570 Actinic keratosis: Secondary | ICD-10-CM | POA: Diagnosis not present

## 2017-05-23 DIAGNOSIS — N184 Chronic kidney disease, stage 4 (severe): Secondary | ICD-10-CM | POA: Diagnosis not present

## 2017-05-29 ENCOUNTER — Other Ambulatory Visit: Payer: Self-pay | Admitting: Endocrinology

## 2017-05-29 ENCOUNTER — Telehealth: Payer: Self-pay | Admitting: Endocrinology

## 2017-05-29 MED ORDER — "INSULIN SYRINGE-NEEDLE U-100 31G X 5/16"" 0.3 ML MISC": 1 refills | Status: DC

## 2017-05-29 NOTE — Telephone Encounter (Signed)
The syringes and the lancets have both been sent to [facility] today.

## 2017-05-29 NOTE — Telephone Encounter (Signed)
[facility] needs the syringes to be called in today

## 2017-05-30 ENCOUNTER — Other Ambulatory Visit: Payer: Self-pay

## 2017-05-30 MED ORDER — "INSULIN SYRINGE-NEEDLE U-100 31G X 5/16"" 0.3 ML MISC": 1 refills | Status: DC

## 2017-06-05 ENCOUNTER — Other Ambulatory Visit: Payer: Self-pay

## 2017-06-05 MED ORDER — "INSULIN SYRINGE-NEEDLE U-100 31G X 5/16"" 0.3 ML MISC": 1 refills | Status: DC

## 2017-06-26 ENCOUNTER — Other Ambulatory Visit: Payer: Self-pay | Admitting: Endocrinology

## 2017-07-04 ENCOUNTER — Other Ambulatory Visit (INDEPENDENT_AMBULATORY_CARE_PROVIDER_SITE_OTHER): Payer: Medicare PPO

## 2017-07-04 DIAGNOSIS — N184 Chronic kidney disease, stage 4 (severe): Secondary | ICD-10-CM

## 2017-07-04 DIAGNOSIS — Z794 Long term (current) use of insulin: Secondary | ICD-10-CM | POA: Diagnosis not present

## 2017-07-04 DIAGNOSIS — D508 Other iron deficiency anemias: Secondary | ICD-10-CM

## 2017-07-04 DIAGNOSIS — E1165 Type 2 diabetes mellitus with hyperglycemia: Secondary | ICD-10-CM

## 2017-07-04 LAB — COMPREHENSIVE METABOLIC PANEL
ALBUMIN: 4.1 g/dL (ref 3.5–5.2)
ALK PHOS: 62 U/L (ref 39–117)
ALT: 12 U/L (ref 0–53)
AST: 25 U/L (ref 0–37)
BILIRUBIN TOTAL: 0.4 mg/dL (ref 0.2–1.2)
BUN: 57 mg/dL — ABNORMAL HIGH (ref 6–23)
CALCIUM: 9.2 mg/dL (ref 8.4–10.5)
CO2: 27 meq/L (ref 19–32)
CREATININE: 2.11 mg/dL — AB (ref 0.40–1.50)
Chloride: 105 mEq/L (ref 96–112)
GFR: 31.71 mL/min — AB (ref >60.00)
Glucose, Bld: 76 mg/dL (ref 70–99)
Potassium: 4.2 mEq/L (ref 3.5–5.1)
Sodium: 139 mEq/L (ref 135–145)
Total Protein: 6.8 g/dL (ref 6.0–8.3)

## 2017-07-04 LAB — CBC
HCT: 36 % — ABNORMAL LOW (ref 39.0–52.0)
HEMOGLOBIN: 11.8 g/dL — AB (ref 13.0–17.0)
MCHC: 32.9 g/dL (ref 30.0–36.0)
MCV: 98 fl (ref 78.0–100.0)
PLATELETS: 274 10*3/uL (ref 150.0–400.0)
RBC: 3.67 Mil/uL — AB (ref 4.22–5.81)
RDW: 13 % (ref 11.5–15.5)
WBC: 8.3 10*3/uL (ref 4.0–10.5)

## 2017-07-04 LAB — LIPID PANEL
CHOL/HDL RATIO: 3
Cholesterol: 117 mg/dL (ref 0–200)
HDL: 44.6 mg/dL (ref >39.00)
LDL CALC: 55 mg/dL (ref 0–99)
NonHDL: 72.65
TRIGLYCERIDES: 88 mg/dL (ref 0.0–149.0)
VLDL: 17.6 mg/dL (ref 0.0–40.0)

## 2017-07-04 LAB — IBC PANEL
Iron: 91 ug/dL (ref 42–165)
SATURATION RATIOS: 32.2 % (ref 20.0–50.0)
Transferrin: 202 mg/dL — ABNORMAL LOW (ref 212.0–360.0)

## 2017-07-04 LAB — MICROALBUMIN / CREATININE URINE RATIO
CREATININE, U: 36.8 mg/dL
MICROALB UR: 7.6 mg/dL — AB (ref 0.0–1.9)
Microalb Creat Ratio: 20.5 mg/g (ref 0.0–30.0)

## 2017-07-04 LAB — HEMOGLOBIN A1C: HEMOGLOBIN A1C: 6.3 % (ref 4.6–6.5)

## 2017-07-05 ENCOUNTER — Other Ambulatory Visit: Payer: Medicare PPO

## 2017-07-05 LAB — PARATHYROID HORMONE, INTACT (NO CA): PTH: 54 pg/mL (ref 15–65)

## 2017-07-10 ENCOUNTER — Encounter: Payer: Self-pay | Admitting: Endocrinology

## 2017-07-10 ENCOUNTER — Ambulatory Visit: Payer: Medicare PPO | Admitting: Endocrinology

## 2017-07-10 VITALS — BP 130/70 | HR 84 | Ht 70.0 in | Wt 217.0 lb

## 2017-07-10 DIAGNOSIS — N184 Chronic kidney disease, stage 4 (severe): Secondary | ICD-10-CM

## 2017-07-10 DIAGNOSIS — Z794 Long term (current) use of insulin: Secondary | ICD-10-CM

## 2017-07-10 DIAGNOSIS — R634 Abnormal weight loss: Secondary | ICD-10-CM

## 2017-07-10 DIAGNOSIS — D638 Anemia in other chronic diseases classified elsewhere: Secondary | ICD-10-CM

## 2017-07-10 DIAGNOSIS — E1165 Type 2 diabetes mellitus with hyperglycemia: Secondary | ICD-10-CM

## 2017-07-10 NOTE — Progress Notes (Signed)
Patient ID: JERMAYNE SWEENEY, male   DOB: 25-Aug-1927, 84 y.o.   MRN: [id number]   Reason for Appointment: Followup of diabetes and various issues  History of Present Illness   Type 2 DIABETES MELITUS diagnosed 1989  He has had long-standing diabetes and has been on basal bolus insulin for the last few years Since 2014 he has required larger doses of insulin especially with having to take periodic courses of steroids for his pulmonary problems Also his Actos was stopped because of tendency to edema and metformin stopped because of renal dysfunction  Insulin regimen: Novolog 8 units before meals, Lantus 26 units in a.m., using syringes  A1c is stable at 6.3, previously 6.4,   Current blood sugar patterns and problems identified:  Since his sugars were relatively lower in the morning he was switched from twice day to once a day Lantus and his total dose reduced by 6 units in October  However overall his blood sugars are still low normal at times and on an average only slightly higher than the last time  Fasting readings are excellent and relatively stable  He is having the most variability in his blood sugars before supper time and occasional readings between 150-220  He says he is usually trying to exercise but is able to get to the [place] only once or twice a week  With exercising he usually tries to skip his morning NovoLog although today he forgot to do so  Usually not having a large meal in the morning and only some amount of protein with peanut butter  HYPOGLYCEMIA has been present sporadically but mostly at lunch and occasionally at suppertime also  Does not appear to have high readings after supper although not checking as much  His weight has gone down 10 pounds although previously was and some edema also.  He is usually taking his NovoLog consistently before eating  Hypoglycemia:  recently as above   Oral hypoglycemic drugs: None           Monitors blood  glucose:  3-4 times a day   Glucometer: Accu-Chek        Glucose  monitor download shows the following results  Average reading are for the last 4 weeks  Mean values apply above for all meters except median for One Touch  PRE-MEAL Fasting Lunch Dinner Bedtime Overall  Glucose range: 76-143  60-149  59-202  83-137    Mean/median: 114  115  132  103  119    POST-MEAL PC Breakfast PC Lunch PC Dinner  Glucose range:     Mean/median:       Previous blood sugar patterns:   PRE-MEAL Fasting Lunch Dinner Bedtime Overall  Glucose range:  57-1 28  49-1 74  64-1 73  117-174  49-267   Mean/median: 82  124  118   106     Meals: 3 meals per day.  breakfast: oatmeal / grits, with peanut butter  Dinner usually at 6:30 pm        Physical activity: exercise:  exercise bike 1-2/7 days at the [facility]  Weight history:  Wt Readings from Last 3 Encounters:  07/10/17 217 lb (98.4 kg)  04/10/17 227 lb 12.8 oz (103.3 kg)  01/02/17 222 lb 9.6 oz (101 kg)               Complications are: Nephropathy. Microalbumin level now consistently normal  LABS:  Lab Results  Component  Value Date   HGBA1C 6.3 07/04/2017   HGBA1C 6.4 04/01/2017   HGBA1C 5.9 12/28/2016   Lab Results  Component Value Date   MICROALBUR 7.6 (H) 07/04/2017   LDLCALC 55 07/04/2017   CREATININE 2.11 (H) 07/04/2017     Problem 2:  RENAL insufficiency: He has had chronic increase in creatinine, was 1.7 in 2014 with slow progression . This has been related to nephropathy and glomerulosclerosis ACE inhibitor/ARB recommended by nephrologist but not done because of the history of patient getting hyperkalemia with these drugs in the past Renal ultrasound did not show any abnormality  Creatinine is  now appearing to be gradually improving since 7/18  Previously highest level has been about 2.6     Lab Results  Component Value Date   CREATININE 2.11 (H) 07/04/2017   CREATININE 2.46 (H) 04/01/2017   CREATININE 2.52 (H)  12/28/2016   CREATININE 2.21 (H) 09/25/2016     He has had a normal PTH, not on calcitriol and only on vitamin D He does not want to follow-up with nephrologist  Lab Results  Component Value Date   PTH 54 07/04/2017   CALCIUM 9.2 07/04/2017   PHOS 2.3 04/30/2014     Proteinuria: He was given diltiazem previously For his proteinuria  when blood pressure was relatively high  but he claims that it was causing itching on his abdominal area    EDEMA: He is taking his Lasix 20 mg about 5 days a week Usually tries not to take it on the days he is away from home   Allergies as of 07/10/2017      Reactions   Doxycycline Rash   Levaquin [levofloxacin In D5w]    Caused chest pain and heartburn   Nitrous Oxide Other (See Comments)   Reaction:  Unknown    Penicillins Other (See Comments)   Reaction:  Unknown Has patient had a PCN reaction causing immediate rash, facial/tongue/throat swelling, SOB or lightheadedness with hypotension: Unsure Has patient had a PCN reaction causing severe rash involving mucus membranes or skin necrosis: Unsure Has patient had a PCN reaction that required hospitalization Unsure  Has patient had a PCN reaction occurring within the last 10 years: No If all of the above answers are "NO", then may proceed with Cephalosporin use.   Tape Other (See Comments)   Reaction:  Tears pts skin    Diltiazem Rash      Medication List        Accurate as of 07/10/17  3:46 PM. Always use your most recent med list.          ACCU-CHEK SOFTCLIX LANCETS lancets TEST AS INSTRUCTED TO OBTAIN A BLOOD SPECIMEN 4 TIMES DAILY   albuterol 108 (90 Base) MCG/ACT inhaler Commonly known as:  PROVENTIL HFA;VENTOLIN HFA Inhale 1-2 puffs into the lungs every 6 (six) hours as needed for wheezing or shortness of breath.   azithromycin 250 MG tablet Commonly known as:  ZITHROMAX Take 250 mg by mouth daily. Takes after dinner   B-D SINGLE USE SWABS REGULAR Pads USE 7 SWABS DAILY AS  DIRECTED   budesonide-formoterol 160-4.5 MCG/ACT inhaler Commonly known as:  SYMBICORT Inhale 2 puffs into the lungs 2 (two) times daily.   cholecalciferol 1000 units tablet Commonly known as:  VITAMIN D Take 2,000 Units by mouth daily.   diphenhydramine-acetaminophen 25-500 MG Tabs tablet Commonly known as:  TYLENOL PM Take 2 tablets by mouth at bedtime as needed.   famotidine 20 MG tablet Commonly  known as:  PEPCID Take 20 mg by mouth at bedtime.   ferrous sulfate 325 (65 FE) MG tablet Take 325 mg by mouth daily with breakfast.   finasteride 5 MG tablet Commonly known as:  PROSCAR Take 1 tablet (5 mg total) by mouth daily.   furosemide 20 MG tablet Commonly known as:  LASIX TAKE 1 TABLET EVERY DAY   insulin aspart 100 UNIT/ML injection Commonly known as:  novoLOG Pt uses 15 units before breakfast, 17 units before lunch, and 17 units before dinner.   Insulin Syringe-Needle U-100 31G X 5/16" 0.3 ML Misc Commonly known as:  BD INSULIN SYRINGE U/F USE THREE TIMES DAILY   LANTUS 100 UNIT/ML injection Generic drug:  insulin glargine INJECT 24 UNITS SUBCUTANEOUSLY IN THE MORNING AND 20 UNITS IN THE EVENING   MELATONIN PO Take 1 tablet by mouth at bedtime.   montelukast 10 MG tablet Commonly known as:  SINGULAIR Take 10 mg by mouth at bedtime.   multivitamin with minerals Tabs tablet Take 1 tablet by mouth daily.   omeprazole 20 MG capsule Commonly known as:  PRILOSEC Take 20 mg by mouth 2 (two) times daily before a meal.   psyllium 0.52 g capsule Commonly known as:  REGULOID Take 0.52 g by mouth daily.   simvastatin 20 MG tablet Commonly known as:  ZOCOR TAKE 1 TABLET EVERY DAY   TOVIAZ 8 MG Tb24 tablet Generic drug:  fesoterodine Take 8 mg by mouth at bedtime.       Allergies:  Allergies  Allergen Reactions  . Doxycycline Rash  . Levaquin [Levofloxacin In D5w]     Caused chest pain and heartburn  . Nitrous Oxide Other (See Comments)     Reaction:  Unknown   . Penicillins Other (See Comments)    Reaction:  Unknown Has patient had a PCN reaction causing immediate rash, facial/tongue/throat swelling, SOB or lightheadedness with hypotension: Unsure Has patient had a PCN reaction causing severe rash involving mucus membranes or skin necrosis: Unsure Has patient had a PCN reaction that required hospitalization Unsure  Has patient had a PCN reaction occurring within the last 10 years: No If all of the above answers are "NO", then may proceed with Cephalosporin use.  . Tape Other (See Comments)    Reaction:  Tears pts skin   . Diltiazem Rash    Past Medical History:  Diagnosis Date  . Anemia   . Chronic renal insufficiency   . DDD (degenerative disc disease), lumbar   . Diabetes mellitus   . Hypertension   . Neuromuscular disorder ([place])   . Osteoporosis     Past Surgical History:  Procedure Laterality Date  . EYE SURGERY    . I&D EXTREMITY Right 08/13/2016   Procedure: IRRIGATION AND DEBRIDEMENT RIGHT ELBOW AND HAND;  Surgeon: Milly Jakob, MD;  Location: [facility];  Service: Orthopedics;  Laterality: Right;  Marland Kitchen VASECTOMY    . VIDEO BRONCHOSCOPY Bilateral 08/25/2012   Procedure: VIDEO BRONCHOSCOPY WITHOUT FLUORO;  Surgeon: Brand Males, MD;  Location: [facility];  Service: Cardiopulmonary;  Laterality: Bilateral;    Family History  Problem Relation Age of Onset  . Diabetes Mellitus II Unknown   . Hypertension Unknown     Social History:  reports that he quit smoking about 44 years ago. His smoking use included cigarettes. He has a 99.00 pack-year smoking history. He quit smokeless tobacco use about 41 years ago. He reports that he does not drink alcohol or use drugs.  Review  of SysteBLOOD PRESSURE:  He is not on any medication and blood pressure is consistently normal    BP Readings from Last 3 Encounters:  07/10/17 130/70  04/10/17 138/80  01/02/17 124/68    HYPERKALEMIA:  He was told to cut back on  high potassium foods because of hyperkalemia  His potassium is still high normal, has been advised to moderate on potassium rich foods   Lab Results  Component Value Date   K 4.2 07/04/2017   ANEMIA:   He has a  history of anemia related to renal dysfunction and mild iron deficiency   Hemoglobin was as low as 9.5  during his last hospitalization  iron saturation as low as 17% previously He has gone back to taking iron since his last visit and hemoglobin is slightly better at 11.8    Lab Results  Component Value Date   WBC 8.3 07/04/2017   HGB 11.8 (L) 07/04/2017   HCT 36.0 (L) 07/04/2017   MCV 98.0 07/04/2017   PLT 274.0 07/04/2017   Lab Results  Component Value Date   IRON 91 07/04/2017   TIBC 256 06/20/2012   FERRITIN 324 (H) 06/20/2012    He has been Using CPAP and this is being adjusted by pulmonologist.    He has had chronic lower urinary tract problems and BPH.  Prescriptions are being done by his urologist  HYPERLIPIDEMIA:         The lipid abnormality consists of  minimally elevated LDL, taking simvastatin for cardiovascular protection.  Has good levels of LDL and triglycerides consistently   Lab Results  Component Value Date   CHOL 117 07/04/2017   HDL 44.60 07/04/2017   LDLCALC 55 07/04/2017   TRIG 88.0 07/04/2017   CHOLHDL 3 07/04/2017     Diabetic foot exam results in 10/18: normal monofilament sensation in the toes and plantar surfaces, no skin lesions or ulcers on the feet and normal pedal pulses   Examination:   BP 130/70   Pulse 84   Ht 5\' 10"  (1.778 m)   Wt 217 lb (98.4 kg)   SpO2 97%   BMI 31.14 kg/m   Body mass index is 31.14 kg/m.   He has 1+ lower leg edema, mostly above the ankle     Assesment/PLAN:   1. Diabetes type 2 with obesity treated with insulin only  See history of present illness for detailed discussion of current diabetes management, blood sugar patterns and problems identifie  The patient's diabetes control is  overall  well controlled and A1c is   stable at 6.3, previously 6.4  Blood sugars are fairly good overall However has variability in blood sugars in the afternoons and not clear why This may be from variability in diet, occasionally over treating low normal sugars at lunchtime or possibly skipping his lunch dose and appropriately Discussed blood sugar targets, avoidance of hypoglycemia and getting balanced meals  Recommended that he will cut back his Lantus by at least 2 units Make sure he skips his NovoLog if he is planning to go for exercise in the morning  Again if blood sugar low-normal at lunchtime he can cut back his NovoLog 6 units in the morning   2.   Chronic kidney disease: His creatinine is gradually improving   No  secondary hyperparathyroidism or hyperphosphatemia  Will continue to monitor here and if any complications or worsening occurs will have him go back to nephrologist  HYPERKALEMIA: Now potassium stable at 4.2  3.  Hypertension history: Blood pressure is normal without any medication However he does have history of microalbuminuria  4.  History of mild anemia,  appears to be better with with restarting iron Probably also has element of chronic disease  Follow-up in 3 months  5.  EDEMA is better and he will need continue 40 mg Lasix 5 days a week  6.  Chronic lung disease/bronchiectasis: .  Continue follow-up with pulmonologist  7.   Weight loss: This is likely to be from improvement edema but also use cutting back on portions.  There are no Patient Instructions on file for this visit.  Total visit time for evaluation and management of multiple problems, medications, counseling, Labs = 25 minutes     Elayne Snare  07/10/2017, 3:46 PM

## 2017-07-10 NOTE — Patient Instructions (Signed)
Lantus 24 units  No Novolog in am if going to [facility]

## 2017-07-22 DIAGNOSIS — R0902 Hypoxemia: Secondary | ICD-10-CM | POA: Diagnosis not present

## 2017-07-22 DIAGNOSIS — J479 Bronchiectasis, uncomplicated: Secondary | ICD-10-CM | POA: Diagnosis not present

## 2017-07-22 DIAGNOSIS — G4733 Obstructive sleep apnea (adult) (pediatric): Secondary | ICD-10-CM | POA: Diagnosis not present

## 2017-07-22 DIAGNOSIS — R911 Solitary pulmonary nodule: Secondary | ICD-10-CM | POA: Diagnosis not present

## 2017-07-22 DIAGNOSIS — Z720 Tobacco use: Secondary | ICD-10-CM | POA: Diagnosis not present

## 2017-07-22 DIAGNOSIS — J441 Chronic obstructive pulmonary disease with (acute) exacerbation: Secondary | ICD-10-CM | POA: Diagnosis not present

## 2017-08-27 DIAGNOSIS — C44311 Basal cell carcinoma of skin of nose: Secondary | ICD-10-CM | POA: Diagnosis not present

## 2017-08-27 DIAGNOSIS — Z08 Encounter for follow-up examination after completed treatment for malignant neoplasm: Secondary | ICD-10-CM | POA: Diagnosis not present

## 2017-08-27 DIAGNOSIS — L570 Actinic keratosis: Secondary | ICD-10-CM | POA: Diagnosis not present

## 2017-08-27 DIAGNOSIS — Z85828 Personal history of other malignant neoplasm of skin: Secondary | ICD-10-CM | POA: Diagnosis not present

## 2017-08-27 DIAGNOSIS — X32XXXD Exposure to sunlight, subsequent encounter: Secondary | ICD-10-CM | POA: Diagnosis not present

## 2017-09-12 ENCOUNTER — Telehealth: Payer: Self-pay | Admitting: Endocrinology

## 2017-09-12 NOTE — Telephone Encounter (Signed)
Patient is out of Lasix. Please send RX for Lasix to [facility] on [address] in [place] asap. Also send it into [facility] for a  90 day supply

## 2017-09-12 NOTE — Telephone Encounter (Signed)
LANTUS 100 UNIT/ML injection   Patient stated he is wanting [place] to send this into pharmacy  He would like a 30 day sent to [facility] and a 90 day sent to [place], [address] AT [facility] [place], [place]

## 2017-09-13 ENCOUNTER — Other Ambulatory Visit: Payer: Self-pay

## 2017-09-13 ENCOUNTER — Other Ambulatory Visit: Payer: Self-pay | Admitting: Endocrinology

## 2017-09-13 MED ORDER — FUROSEMIDE 20 MG PO TABS: 20.0000 mg | ORAL_TABLET | Freq: Every day | ORAL | 2 refills | Status: DC

## 2017-09-13 MED ORDER — FUROSEMIDE 20 MG PO TABS: 20.0000 mg | ORAL_TABLET | Freq: Every day | ORAL | 0 refills | Status: DC

## 2017-09-13 NOTE — Telephone Encounter (Signed)
A 30 day supply was sent to [facility] on [address] and a 90 day supply was sent to [facility] patient has been notified

## 2017-09-18 ENCOUNTER — Other Ambulatory Visit: Payer: Self-pay

## 2017-09-18 ENCOUNTER — Other Ambulatory Visit: Payer: Self-pay | Admitting: Endocrinology

## 2017-09-18 MED ORDER — INSULIN GLARGINE 100 UNIT/ML ~~LOC~~ SOLN
SUBCUTANEOUS | 0 refills | Status: DC
Start: 2017-09-18 — End: 2018-08-06

## 2017-09-18 MED ORDER — INSULIN GLARGINE 100 UNIT/ML ~~LOC~~ SOLN: SUBCUTANEOUS | 1 refills | Status: DC

## 2017-09-18 NOTE — Telephone Encounter (Signed)
Both have been sent. 

## 2017-09-20 DIAGNOSIS — G4733 Obstructive sleep apnea (adult) (pediatric): Secondary | ICD-10-CM | POA: Diagnosis not present

## 2017-09-24 DIAGNOSIS — H43812 Vitreous degeneration, left eye: Secondary | ICD-10-CM | POA: Diagnosis not present

## 2017-09-24 DIAGNOSIS — E113293 Type 2 diabetes mellitus with mild nonproliferative diabetic retinopathy without macular edema, bilateral: Secondary | ICD-10-CM | POA: Diagnosis not present

## 2017-09-24 DIAGNOSIS — H35373 Puckering of macula, bilateral: Secondary | ICD-10-CM | POA: Diagnosis not present

## 2017-10-03 ENCOUNTER — Other Ambulatory Visit (INDEPENDENT_AMBULATORY_CARE_PROVIDER_SITE_OTHER): Payer: Medicare PPO

## 2017-10-03 ENCOUNTER — Other Ambulatory Visit: Payer: Self-pay

## 2017-10-03 DIAGNOSIS — D638 Anemia in other chronic diseases classified elsewhere: Secondary | ICD-10-CM | POA: Diagnosis not present

## 2017-10-03 DIAGNOSIS — R634 Abnormal weight loss: Secondary | ICD-10-CM

## 2017-10-03 DIAGNOSIS — Z794 Long term (current) use of insulin: Secondary | ICD-10-CM | POA: Diagnosis not present

## 2017-10-03 DIAGNOSIS — E1165 Type 2 diabetes mellitus with hyperglycemia: Secondary | ICD-10-CM | POA: Diagnosis not present

## 2017-10-03 LAB — HEMOGLOBIN A1C: HEMOGLOBIN A1C: 6.4 % (ref 4.6–6.5)

## 2017-10-03 LAB — COMPREHENSIVE METABOLIC PANEL
ALBUMIN: 4 g/dL (ref 3.5–5.2)
ALT: 12 U/L (ref 0–53)
AST: 19 U/L (ref 0–37)
Alkaline Phosphatase: 58 U/L (ref 39–117)
BILIRUBIN TOTAL: 0.4 mg/dL (ref 0.2–1.2)
BUN: 48 mg/dL — AB (ref 6–23)
CO2: 24 mEq/L (ref 19–32)
Calcium: 9.4 mg/dL (ref 8.4–10.5)
Chloride: 106 mEq/L (ref 96–112)
Creatinine, Ser: 2.39 mg/dL — ABNORMAL HIGH (ref 0.40–1.50)
GFR: 27.44 mL/min — AB (ref >60.00)
Glucose, Bld: 101 mg/dL — ABNORMAL HIGH (ref 70–99)
Potassium: 4.2 mEq/L (ref 3.5–5.1)
Sodium: 139 mEq/L (ref 135–145)
TOTAL PROTEIN: 6.7 g/dL (ref 6.0–8.3)

## 2017-10-03 LAB — CBC
HEMATOCRIT: 34 % — AB (ref 39.0–52.0)
Hemoglobin: 11.6 g/dL — ABNORMAL LOW (ref 13.0–17.0)
MCHC: 34.2 g/dL (ref 30.0–36.0)
MCV: 95.9 fl (ref 78.0–100.0)
PLATELETS: 241 10*3/uL (ref 150.0–400.0)
RBC: 3.55 Mil/uL — AB (ref 4.22–5.81)
RDW: 13 % (ref 11.5–15.5)
WBC: 7.1 10*3/uL (ref 4.0–10.5)

## 2017-10-03 LAB — TSH: TSH: 2.57 u[IU]/mL (ref 0.35–4.50)

## 2017-10-03 MED ORDER — GLUCOSE BLOOD VI STRP: 1.0000 | ORAL_STRIP | Freq: Every day | 3 refills | Status: DC

## 2017-10-09 ENCOUNTER — Ambulatory Visit: Payer: Medicare PPO | Admitting: Endocrinology

## 2017-10-09 ENCOUNTER — Encounter: Payer: Self-pay | Admitting: Endocrinology

## 2017-10-09 VITALS — BP 142/82 | HR 84 | Ht 70.0 in | Wt 211.8 lb

## 2017-10-09 DIAGNOSIS — D508 Other iron deficiency anemias: Secondary | ICD-10-CM

## 2017-10-09 DIAGNOSIS — N184 Chronic kidney disease, stage 4 (severe): Secondary | ICD-10-CM | POA: Diagnosis not present

## 2017-10-09 DIAGNOSIS — E1165 Type 2 diabetes mellitus with hyperglycemia: Secondary | ICD-10-CM

## 2017-10-09 DIAGNOSIS — Z23 Encounter for immunization: Secondary | ICD-10-CM

## 2017-10-09 DIAGNOSIS — Z794 Long term (current) use of insulin: Secondary | ICD-10-CM

## 2017-10-09 DIAGNOSIS — R634 Abnormal weight loss: Secondary | ICD-10-CM | POA: Diagnosis not present

## 2017-10-09 NOTE — Progress Notes (Signed)
Patient ID: Clayton [name], male   DOB: Jan 21, 1928, 84 y.o.   MRN: [id number]   Reason for Appointment: Followup of diabetes and various issues  History of Present Illness   Type 2 DIABETES MELITUS diagnosed 1989  He has had long-standing diabetes and has been on basal bolus insulin for the last few years Since 2014 he has required larger doses of insulin especially with having to take periodic courses of steroids for his pulmonary problems Also his Actos was stopped because of tendency to edema and metformin stopped because of renal dysfunction  Insulin regimen: Novolog 8 units before meals, Lantus 26 units in a.m., using syringes  A1c is stable at 6.4,   Current blood sugar patterns and problems identified:  He continues to do well with only once a day Lantus  Fasting readings are fairly stable although slightly higher than before  Despite his not taking any NovoLog at breakfast and lunch when he exercises his blood sugars are significantly lower after breakfast around 9-10 AM with couple of readings in the low 60s  He is still getting mostly oatmeal in the morning for breakfast with peanut butter  He also has stopped taking NOVOLOG at lunchtime but is usually exercising for 30 minutes after eating lunch  Only occasionally will have relatively high readings up to 189 late afternoon based on his diet  However not checking enough readings after supper  Does not feel hypoglycemic with blood sugars in the 60s  His weight has come down but he thinks his appetite is still fairly good  Hypoglycemia:  recently as above   Oral hypoglycemic drugs: None           Monitors blood glucose:  3-4 times a day   Glucometer: Accu-Chek        Glucose  monitor download shows the following results  Average reading are for the last 4 weeks  Mean values apply above for all meters except median for One Touch  PRE-MEAL Fasting Lunch Dinner Bedtime Overall  Glucose range:       63-251  Mean/median:  129  107  138  161  123+/-34   POST-MEAL PC Breakfast PC Lunch PC Dinner  Glucose range:     Mean/median:  99     Previous readings:  Mean values apply above for all meters except median for One Touch  PRE-MEAL Fasting Lunch Dinner Bedtime Overall  Glucose range: 76-143  60-149  59-202  83-137    Mean/median: 114  115  132  103  119    POST-MEAL PC Breakfast PC Lunch PC Dinner  Glucose range:     Mean/median:        Meals: 3 meals per day.  breakfast: oatmeal / grits, with peanut butter, for lunch he will have a sandwich with apple and grapefruit   Dinner usually at 6:30 pm        Physical activity: exercise:  exercise bike 1-2/7 days at the [facility]  Weight history:  Wt Readings from Last 3 Encounters:  10/09/17 211 lb 12.8 oz (96.1 kg)  07/10/17 217 lb (98.4 kg)  04/10/17 227 lb 12.8 oz (103.3 kg)               Complications are: Nephropathy. Microalbumin level now consistently normal  LABS:  Lab Results  Component Value Date   HGBA1C 6.4 10/03/2017   HGBA1C 6.3 07/04/2017   HGBA1C 6.4 04/01/2017   Lab Results  Component Value Date   MICROALBUR 7.6 (H) 07/04/2017   LDLCALC 55 07/04/2017   CREATININE 2.39 (H) 10/03/2017     Problem 2:  RENAL insufficiency: He has had chronic increase in creatinine, was 1.7 in 2014 with slow progression . This has been related to nephropathy and glomerulosclerosis ACE inhibitor/ARB recommended by nephrologist but not done because of the history of patient getting hyperkalemia with these drugs in the past Renal ultrasound did not show any abnormality  Creatinine is  now appearing to be gradually improving since 7/18  Previously highest level has been about 2.6     Lab Results  Component Value Date   CREATININE 2.39 (H) 10/03/2017   CREATININE 2.11 (H) 07/04/2017   CREATININE 2.46 (H) 04/01/2017   CREATININE 2.52 (H) 12/28/2016     He has had a normal PTH, not on calcitriol and only on vitamin  D He does not want to follow-up with nephrologist  Lab Results  Component Value Date   PTH 54 07/04/2017   CALCIUM 9.4 10/03/2017   PHOS 2.3 04/30/2014      Allergies as of 10/09/2017      Reactions   Doxycycline Rash   Levaquin [levofloxacin In D5w]    Caused chest pain and heartburn   Nitrous Oxide Other (See Comments)   Reaction:  Unknown    Penicillins Other (See Comments)   Reaction:  Unknown Has patient had a PCN reaction causing immediate rash, facial/tongue/throat swelling, SOB or lightheadedness with hypotension: Unsure Has patient had a PCN reaction causing severe rash involving mucus membranes or skin necrosis: Unsure Has patient had a PCN reaction that required hospitalization Unsure  Has patient had a PCN reaction occurring within the last 10 years: No If all of the above answers are "NO", then may proceed with Cephalosporin use.   Tape Other (See Comments)   Reaction:  Tears pts skin    Diltiazem Rash      Medication List        Accurate as of 10/09/17  4:33 PM. Always use your most recent med list.          ACCU-CHEK SOFTCLIX LANCETS lancets TEST AS INSTRUCTED TO OBTAIN A BLOOD SPECIMEN 4 TIMES DAILY   albuterol 108 (90 Base) MCG/ACT inhaler Commonly known as:  PROVENTIL HFA;VENTOLIN HFA Inhale 1-2 puffs into the lungs every 6 (six) hours as needed for wheezing or shortness of breath.   azithromycin 250 MG tablet Commonly known as:  ZITHROMAX Take 250 mg by mouth daily. Takes after dinner   B-D SINGLE USE SWABS REGULAR Pads USE 7 SWABS DAILY AS DIRECTED   budesonide-formoterol 160-4.5 MCG/ACT inhaler Commonly known as:  SYMBICORT Inhale 2 puffs into the lungs 2 (two) times daily.   cholecalciferol 1000 units tablet Commonly known as:  VITAMIN D Take 2,000 Units by mouth daily.   diphenhydramine-acetaminophen 25-500 MG Tabs tablet Commonly known as:  TYLENOL PM Take 2 tablets by mouth at bedtime as needed.   famotidine 20 MG  tablet Commonly known as:  PEPCID Take 20 mg by mouth at bedtime.   ferrous sulfate 325 (65 FE) MG tablet Take 325 mg by mouth daily with breakfast.   finasteride 5 MG tablet Commonly known as:  PROSCAR TAKE 1 TABLET EVERY DAY   furosemide 20 MG tablet Commonly known as:  LASIX Take 1 tablet (20 mg total) by mouth daily.   glucose blood test strip Commonly known as:  ACCU-CHEK AVIVA PLUS 1 each by Other route  daily. Use as instructed to check blood sugar 4 times daily.   insulin aspart 100 UNIT/ML injection Commonly known as:  novoLOG Pt uses 15 units before breakfast, 17 units before lunch, and 17 units before dinner.   insulin glargine 100 UNIT/ML injection Commonly known as:  LANTUS INJECT 24 UNITS SUBCUTANEOUSLY IN THE MORNING AND 20 UNITS IN THE EVENING   Insulin Syringe-Needle U-100 31G X 5/16" 0.3 ML Misc Commonly known as:  BD INSULIN SYRINGE U/F USE THREE TIMES DAILY   MELATONIN PO Take 1 tablet by mouth at bedtime.   montelukast 10 MG tablet Commonly known as:  SINGULAIR Take 10 mg by mouth at bedtime.   multivitamin with minerals Tabs tablet Take 1 tablet by mouth daily.   omeprazole 20 MG capsule Commonly known as:  PRILOSEC Take 20 mg by mouth 2 (two) times daily before a meal.   psyllium 0.52 g capsule Commonly known as:  REGULOID Take 0.52 g by mouth daily.   simvastatin 20 MG tablet Commonly known as:  ZOCOR TAKE 1 TABLET EVERY DAY   TOVIAZ 8 MG Tb24 tablet Generic drug:  fesoterodine Take 8 mg by mouth at bedtime.       Allergies:  Allergies  Allergen Reactions  . Doxycycline Rash  . Levaquin [Levofloxacin In D5w]     Caused chest pain and heartburn  . Nitrous Oxide Other (See Comments)    Reaction:  Unknown   . Penicillins Other (See Comments)    Reaction:  Unknown Has patient had a PCN reaction causing immediate rash, facial/tongue/throat swelling, SOB or lightheadedness with hypotension: Unsure Has patient had a PCN reaction  causing severe rash involving mucus membranes or skin necrosis: Unsure Has patient had a PCN reaction that required hospitalization Unsure  Has patient had a PCN reaction occurring within the last 10 years: No If all of the above answers are "NO", then may proceed with Cephalosporin use.  . Tape Other (See Comments)    Reaction:  Tears pts skin   . Diltiazem Rash    Past Medical History:  Diagnosis Date  . Anemia   . Chronic renal insufficiency   . DDD (degenerative disc disease), lumbar   . Diabetes mellitus   . Hypertension   . Neuromuscular disorder ([place])   . Osteoporosis     Past Surgical History:  Procedure Laterality Date  . EYE SURGERY    . I&D EXTREMITY Right 08/13/2016   Procedure: IRRIGATION AND DEBRIDEMENT RIGHT ELBOW AND HAND;  Surgeon: Milly Jakob, MD;  Location: [facility];  Service: Orthopedics;  Laterality: Right;  Marland Kitchen VASECTOMY    . VIDEO BRONCHOSCOPY Bilateral 08/25/2012   Procedure: VIDEO BRONCHOSCOPY WITHOUT FLUORO;  Surgeon: Brand Males, MD;  Location: [facility];  Service: Cardiopulmonary;  Laterality: Bilateral;    Family History  Problem Relation Age of Onset  . Diabetes Mellitus II Unknown   . Hypertension Unknown     Social History:  reports that he quit smoking about 44 years ago. His smoking use included cigarettes. He has a 99.00 pack-year smoking history. He quit smokeless tobacco use about 42 years ago. He reports that he does not drink alcohol or use drugs.  Review of Systems  BLOOD PRESSURE:  He is not on any medication and blood pressure is fairly normal    BP Readings from Last 3 Encounters:  10/09/17 (!) 142/82  07/10/17 130/70  04/10/17 138/80    HYPERKALEMIA:  No recurrence of this, usually watching his diet for high  potassium foods   Lab Results  Component Value Date   K 4.2 10/03/2017      EDEMA: He is taking his Lasix 20 mg about 5 days a week Usually tries not to take it on the days he is away from  home  ANEMIA:   He has a  history of anemia related to renal dysfunction and mild iron deficiency   With continuing to take iron supplements his hemoglobin is about the same at 11.8 Last iron saturation was normal    Lab Results  Component Value Date   WBC 7.1 10/03/2017   HGB 11.6 (L) 10/03/2017   HCT 34.0 (L) 10/03/2017   MCV 95.9 10/03/2017   PLT 241.0 10/03/2017   Lab Results  Component Value Date   IRON 91 07/04/2017   TIBC 256 06/20/2012   FERRITIN 324 (H) 06/20/2012    He has been Using CPAP and this is being adjusted by pulmonologist.    He has had chronic lower urinary tract problems and BPH.  Prescriptions are being done by his urologist  HYPERLIPIDEMIA:     He is taking simvastatin for cardiovascular protection.  Has good levels of LDL and triglycerides consis as follows: T Ently   Lab Results  Component Value Date   CHOL 117 07/04/2017   HDL 44.60 07/04/2017   LDLCALC 55 07/04/2017   TRIG 88.0 07/04/2017   CHOLHDL 3 07/04/2017     Diabetic foot exam results in 10/18: normal monofilament sensation in the toes and plantar surfaces, no skin lesions or ulcers on the feet and normal pedal pulses   Examination:   BP (!) 142/82 (BP Location: Left Arm, Patient Position: Sitting, Cuff Size: Normal)   Pulse 84   Ht 5\' 10"  (1.778 m)   Wt 211 lb 12.8 oz (96.1 kg)   SpO2 93%   BMI 30.39 kg/m   Body mass index is 30.39 kg/m.   No ankle edema present    Assesment/PLAN:   1. Diabetes type 2 with obesity treated with insulin only  See history of present illness for detailed discussion of current diabetes management, blood sugar patterns and problems identifie  The patient's diabetes control is excellent with A1c below 6.5 again His blood sugars are generally fairly good with only some high readings around suppertime Also with his exercising at least once or twice a day after breakfast and lunch he is not needing mealtime insulin for those meals In  fact his blood sugars are relatively low after breakfast and averaging only 99  He will continue the same insulin but make sure he has an extra carbohydrate like half an apple at breakfast Also reminded him to increase his insulin at suppertime if eating more carbohydrate is also make sure he takes some insulin at lunch if he is not planning to exercise More readings needed after supper  2.   Chronic kidney disease: His creatinine is fluctuating but overall stable Also no hyperkalemia currently Needs periodic evaluation of PTH level  3.  Hypertension history: Blood pressure is normal without any medication However he does have history of microalbuminuria  4.  History of mild anemia,   likely related to chronic disease of renal insufficiency We will continue iron also and monitor iron level on the next visit   5.  EDEMA is controlled and he will continue 40 mg Lasix 5 days a week  6.    Weight loss: He needs to follow-up with his PCP to have general exam  Otherwise since he is feeling good and has normal appetite may be related to fluid changes recently Also he is exercising more than before  7.  Shingles vaccine booster given, this completes his series   There are no Patient Instructions on file for this visit.   Total visit time for evaluation and management of multiple problems and counseling =25 minutes    Elayne Snare  10/09/2017, 4:33 PM

## 2017-10-31 ENCOUNTER — Other Ambulatory Visit: Payer: Self-pay

## 2017-10-31 ENCOUNTER — Telehealth: Payer: Self-pay | Admitting: Endocrinology

## 2017-10-31 MED ORDER — FUROSEMIDE 20 MG PO TABS: 20.0000 mg | ORAL_TABLET | Freq: Every day | ORAL | 3 refills | Status: DC

## 2017-10-31 NOTE — Telephone Encounter (Signed)
Meds sent to pharmacy with accurate dosage.

## 2017-10-31 NOTE — Telephone Encounter (Signed)
Patient ask if you would notify [facility] that he take his Lasix 2 tabs 5 x week.  [place], [place] [phone] ([phone] (Fax)

## 2017-12-04 ENCOUNTER — Other Ambulatory Visit: Payer: Self-pay

## 2017-12-04 MED ORDER — INSULIN ASPART 100 UNIT/ML ~~LOC~~ SOLN: SUBCUTANEOUS | 2 refills | Status: DC

## 2017-12-11 DIAGNOSIS — Z85828 Personal history of other malignant neoplasm of skin: Secondary | ICD-10-CM | POA: Diagnosis not present

## 2017-12-11 DIAGNOSIS — Z08 Encounter for follow-up examination after completed treatment for malignant neoplasm: Secondary | ICD-10-CM | POA: Diagnosis not present

## 2017-12-11 DIAGNOSIS — X32XXXD Exposure to sunlight, subsequent encounter: Secondary | ICD-10-CM | POA: Diagnosis not present

## 2017-12-11 DIAGNOSIS — L570 Actinic keratosis: Secondary | ICD-10-CM | POA: Diagnosis not present

## 2018-01-23 DIAGNOSIS — G4733 Obstructive sleep apnea (adult) (pediatric): Secondary | ICD-10-CM | POA: Diagnosis not present

## 2018-01-23 DIAGNOSIS — J479 Bronchiectasis, uncomplicated: Secondary | ICD-10-CM | POA: Diagnosis not present

## 2018-01-23 DIAGNOSIS — R911 Solitary pulmonary nodule: Secondary | ICD-10-CM | POA: Diagnosis not present

## 2018-01-23 DIAGNOSIS — J441 Chronic obstructive pulmonary disease with (acute) exacerbation: Secondary | ICD-10-CM | POA: Diagnosis not present

## 2018-01-23 DIAGNOSIS — Z720 Tobacco use: Secondary | ICD-10-CM | POA: Diagnosis not present

## 2018-02-07 ENCOUNTER — Other Ambulatory Visit (INDEPENDENT_AMBULATORY_CARE_PROVIDER_SITE_OTHER): Payer: Medicare PPO

## 2018-02-07 DIAGNOSIS — E1165 Type 2 diabetes mellitus with hyperglycemia: Secondary | ICD-10-CM | POA: Diagnosis not present

## 2018-02-07 DIAGNOSIS — Z794 Long term (current) use of insulin: Secondary | ICD-10-CM

## 2018-02-07 DIAGNOSIS — R634 Abnormal weight loss: Secondary | ICD-10-CM

## 2018-02-07 DIAGNOSIS — D508 Other iron deficiency anemias: Secondary | ICD-10-CM

## 2018-02-07 LAB — BASIC METABOLIC PANEL
BUN: 57 mg/dL — ABNORMAL HIGH (ref 6–23)
CO2: 24 mEq/L (ref 19–32)
Calcium: 9.7 mg/dL (ref 8.4–10.5)
Chloride: 105 mEq/L (ref 96–112)
Creatinine, Ser: 2.63 mg/dL — ABNORMAL HIGH (ref 0.40–1.50)
GFR: 24.55 mL/min — AB (ref >60.00)
GLUCOSE: 181 mg/dL — AB (ref 70–99)
Potassium: 4 mEq/L (ref 3.5–5.1)
SODIUM: 139 meq/L (ref 135–145)

## 2018-02-07 LAB — CBC
HCT: 31.8 % — ABNORMAL LOW (ref 39.0–52.0)
Hemoglobin: 10.8 g/dL — ABNORMAL LOW (ref 13.0–17.0)
MCHC: 33.8 g/dL (ref 30.0–36.0)
MCV: 95.4 fl (ref 78.0–100.0)
Platelets: 279 10*3/uL (ref 150.0–400.0)
RBC: 3.33 Mil/uL — AB (ref 4.22–5.81)
RDW: 13.3 % (ref 11.5–15.5)
WBC: 8.2 10*3/uL (ref 4.0–10.5)

## 2018-02-07 LAB — IBC PANEL
IRON: 82 ug/dL (ref 42–165)
Saturation Ratios: 24.7 % (ref 20.0–50.0)
Transferrin: 237 mg/dL (ref 212.0–360.0)

## 2018-02-07 LAB — HEMOGLOBIN A1C: Hgb A1c MFr Bld: 6.8 % — ABNORMAL HIGH (ref 4.6–6.5)

## 2018-02-07 LAB — TSH: TSH: 2.6 u[IU]/mL (ref 0.35–4.50)

## 2018-02-12 ENCOUNTER — Encounter: Payer: Self-pay | Admitting: Endocrinology

## 2018-02-12 ENCOUNTER — Ambulatory Visit: Payer: Medicare PPO | Admitting: Endocrinology

## 2018-02-12 VITALS — BP 144/70 | HR 95 | Ht 70.0 in | Wt 196.8 lb

## 2018-02-12 DIAGNOSIS — N184 Chronic kidney disease, stage 4 (severe): Secondary | ICD-10-CM

## 2018-02-12 DIAGNOSIS — D638 Anemia in other chronic diseases classified elsewhere: Secondary | ICD-10-CM | POA: Diagnosis not present

## 2018-02-12 DIAGNOSIS — Z794 Long term (current) use of insulin: Secondary | ICD-10-CM

## 2018-02-12 DIAGNOSIS — E1165 Type 2 diabetes mellitus with hyperglycemia: Secondary | ICD-10-CM | POA: Diagnosis not present

## 2018-02-12 MED ORDER — INSULIN SYRINGE-NEEDLE U-100 31G X 5/16" 0.3 ML MISC
1 refills | Status: DC
Start: 2018-02-12 — End: 2018-07-15

## 2018-02-12 NOTE — Progress Notes (Signed)
Patient ID: Clayton [name], male   DOB: Nov 13, 1927, 84 y.o.   MRN: [id number]   Reason for Appointment: Followup of diabetes and various issues  History of Present Illness   Type 2 DIABETES MELITUS diagnosed 1989  He has had long-standing diabetes and has been on basal bolus insulin for the last few years Since 2014 he has required larger doses of insulin especially with having to take periodic courses of steroids for his pulmonary problems Also his Actos was stopped because of tendency to edema and metformin stopped because of renal dysfunction  Insulin regimen: Novolog 6-6-8 units before meals, Lantus 24 units in a.m., using syringes  A1c is stable at 6.8, previously 6.4  Current blood sugar patterns and problems identified:  Recently has been checking his sugars 3 times a day although planning about painful fingersticks  He has changed his routine and is trying to exercise almost daily now which is usually midmorning at the [facility]  He says he is eating an early lunch after his exercise around 9-10 AM and then will exercise a little more again  Because of going for exercise he does not take any NovoLog before either of those 2 early meals  However his blood sugars appear to be relatively high before suppertime but more on the days he is not exercising  He also has sporadic high readings after his evening meal possibly when he is eating more carbohydrates  His blood sugar monitor has the incorrect time programmed and difficult to interpret the download now  FASTING readings are still fairly good with taking Lantus only in the morning and averaging about 125  Most of the blood sugar fluctuation is in the evenings after supper  His weight has come down significantly but he thinks his appetite is still fairly good  Hypoglycemia:  recently as above   Oral hypoglycemic drugs: None           Monitors blood glucose:  3-4 times a day   Glucometer: Accu-Chek          Glucose  monitor download shows the following results  Average reading are for the last 4 weeks   PRE-MEAL Fasting Lunch Dinner Bedtime Overall  Glucose range: 92-186    80-249   Mean/median:  126  139  157  149 140 +/-    Meals: 3 meals per day.  breakfast: oatmeal / grits, with peanut butter, for lunch he will have a sandwich with apple and grapefruit   Dinner usually at 6:30 pm        Physical activity: exercise:  exercise bike 1-2/7 days at the [facility]  Weight history:  Wt Readings from Last 3 Encounters:  02/12/18 196 lb 12.8 oz (89.3 kg)  10/09/17 211 lb 12.8 oz (96.1 kg)  07/10/17 217 lb (98.4 kg)               Complications are: Nephropathy. Microalbumin level now consistently normal  LABS:  Lab Results  Component Value Date   HGBA1C 6.8 (H) 02/07/2018   HGBA1C 6.4 10/03/2017   HGBA1C 6.3 07/04/2017   Lab Results  Component Value Date   MICROALBUR 7.6 (H) 07/04/2017   LDLCALC 55 07/04/2017   CREATININE 2.63 (H) 02/07/2018   Problem 2:  RENAL insufficiency: He has had chronic increase in creatinine, was 1.7 in 2014 with slow progression . This has been related to nephropathy and glomerulosclerosis ACE inhibitor/ARB recommended by nephrologist but not  done because of the history of patient getting hyperkalemia with these drugs in the past Renal ultrasound did not show any abnormality  Creatinine is  now at the highest level also has been fluctuating Does not follow-up with nephrologist  Lab Results  Component Value Date   CREATININE 2.63 (H) 02/07/2018   CREATININE 2.39 (H) 10/03/2017   CREATININE 2.11 (H) 07/04/2017   CREATININE 2.46 (H) 04/01/2017     He has had a normal PTH, not on calcitriol and only on vitamin D He does not want to follow-up with nephrologist  Lab Results  Component Value Date   PTH 54 07/04/2017   CALCIUM 9.7 02/07/2018   PHOS 2.3 04/30/2014      Allergies as of 02/12/2018      Reactions   Doxycycline Rash   Levaquin  [levofloxacin In D5w]    Caused chest pain and heartburn   Nitrous Oxide Other (See Comments)   Reaction:  Unknown    Penicillins Other (See Comments)   Reaction:  Unknown Has patient had a PCN reaction causing immediate rash, facial/tongue/throat swelling, SOB or lightheadedness with hypotension: Unsure Has patient had a PCN reaction causing severe rash involving mucus membranes or skin necrosis: Unsure Has patient had a PCN reaction that required hospitalization Unsure  Has patient had a PCN reaction occurring within the last 10 years: No If all of the above answers are "NO", then may proceed with Cephalosporin use.   Tape Other (See Comments)   Reaction:  Tears pts skin    Diltiazem Rash      Medication List        Accurate as of 02/12/18 11:59 PM. Always use your most recent med list.          ACCU-CHEK SOFTCLIX LANCETS lancets TEST AS INSTRUCTED TO OBTAIN A BLOOD SPECIMEN 4 TIMES DAILY   albuterol 108 (90 Base) MCG/ACT inhaler Commonly known as:  PROVENTIL HFA;VENTOLIN HFA Inhale 1-2 puffs into the lungs every 6 (six) hours as needed for wheezing or shortness of breath.   azithromycin 250 MG tablet Commonly known as:  ZITHROMAX Take 250 mg by mouth daily. Takes after dinner   B-D SINGLE USE SWABS REGULAR Pads USE 7 SWABS DAILY AS DIRECTED   budesonide-formoterol 160-4.5 MCG/ACT inhaler Commonly known as:  SYMBICORT Inhale 2 puffs into the lungs 2 (two) times daily.   cholecalciferol 1000 units tablet Commonly known as:  VITAMIN D Take 2,000 Units by mouth daily.   diphenhydramine-acetaminophen 25-500 MG Tabs tablet Commonly known as:  TYLENOL PM Take 2 tablets by mouth at bedtime as needed.   famotidine 20 MG tablet Commonly known as:  PEPCID Take 20 mg by mouth at bedtime.   ferrous sulfate 325 (65 FE) MG tablet Take 325 mg by mouth daily with breakfast.   finasteride 5 MG tablet Commonly known as:  PROSCAR TAKE 1 TABLET EVERY DAY   furosemide 20 MG  tablet Commonly known as:  LASIX Take 1 tablet (20 mg total) by mouth daily. Take 2 tabs 5 times weekly.   glucose blood test strip 1 each by Other route daily. Use as instructed to check blood sugar 4 times daily.   insulin aspart 100 UNIT/ML injection Commonly known as:  novoLOG TAKE 8 UNITS UNDER THE SKIN DAILY Monday-Friday AND ON Saturday AND Sunday TAKE 6 UNITS AT BREAKFAST AND LUNCH, AND 8 UNITS AT DINNER.   insulin glargine 100 UNIT/ML injection Commonly known as:  LANTUS INJECT 24 UNITS SUBCUTANEOUSLY IN THE  MORNING AND 20 UNITS IN THE EVENING   Insulin Syringe-Needle U-100 31G X 5/16" 0.3 ML Misc USE THREE TIMES DAILY   MELATONIN PO Take 1 tablet by mouth at bedtime.   montelukast 10 MG tablet Commonly known as:  SINGULAIR Take 10 mg by mouth at bedtime.   multivitamin with minerals Tabs tablet Take 1 tablet by mouth daily.   omeprazole 20 MG capsule Commonly known as:  PRILOSEC Take 20 mg by mouth 2 (two) times daily before a meal.   psyllium 0.52 g capsule Commonly known as:  REGULOID Take 0.52 g by mouth daily.   simvastatin 20 MG tablet Commonly known as:  ZOCOR TAKE 1 TABLET EVERY DAY   TOVIAZ 8 MG Tb24 tablet Generic drug:  fesoterodine Take 8 mg by mouth at bedtime.       Allergies:  Allergies  Allergen Reactions  . Doxycycline Rash  . Levaquin [Levofloxacin In D5w]     Caused chest pain and heartburn  . Nitrous Oxide Other (See Comments)    Reaction:  Unknown   . Penicillins Other (See Comments)    Reaction:  Unknown Has patient had a PCN reaction causing immediate rash, facial/tongue/throat swelling, SOB or lightheadedness with hypotension: Unsure Has patient had a PCN reaction causing severe rash involving mucus membranes or skin necrosis: Unsure Has patient had a PCN reaction that required hospitalization Unsure  Has patient had a PCN reaction occurring within the last 10 years: No If all of the above answers are "NO", then may proceed  with Cephalosporin use.  . Tape Other (See Comments)    Reaction:  Tears pts skin   . Diltiazem Rash    Past Medical History:  Diagnosis Date  . Anemia   . Chronic renal insufficiency   . DDD (degenerative disc disease), lumbar   . Diabetes mellitus   . Hypertension   . Neuromuscular disorder ([place])   . Osteoporosis     Past Surgical History:  Procedure Laterality Date  . EYE SURGERY    . I&D EXTREMITY Right 08/13/2016   Procedure: IRRIGATION AND DEBRIDEMENT RIGHT ELBOW AND HAND;  Surgeon: Milly Jakob, MD;  Location: [facility];  Service: Orthopedics;  Laterality: Right;  Marland Kitchen VASECTOMY    . VIDEO BRONCHOSCOPY Bilateral 08/25/2012   Procedure: VIDEO BRONCHOSCOPY WITHOUT FLUORO;  Surgeon: Brand Males, MD;  Location: [place];  Service: Cardiopulmonary;  Laterality: Bilateral;    Family History  Problem Relation Age of Onset  . Diabetes Mellitus II Unknown   . Hypertension Unknown     Social History:  reports that he quit smoking about 44 years ago. His smoking use included cigarettes. He has a 99.00 pack-year smoking history. He quit smokeless tobacco use about 42 years ago. He reports that he does not drink alcohol or use drugs.  Review of Systems  BLOOD PRESSURE:  He is not on any medication and blood pressure is fairly normal    BP Readings from Last 3 Encounters:  02/12/18 (!) 144/70  10/09/17 (!) 142/82  07/10/17 130/70    HYPERKALEMIA:  No recurrence of this, usually watching his diet for high potassium foods   Lab Results  Component Value Date   K 4.0 02/07/2018      EDEMA: He is taking his Lasix 20 mg about 5 days a week Usually tries not to take it on the days he is away from home  ANEMIA:   He has a  history of anemia related to renal dysfunction and  mild iron deficiency   With continuing to take iron supplements his iron saturation was normal  However his hemoglobin is lower at 10.8 Did not complain of any shortness of breath or  fatigue  Lab Results  Component Value Date   WBC 8.2 02/07/2018   HGB 10.8 (L) 02/07/2018   HCT 31.8 (L) 02/07/2018   MCV 95.4 02/07/2018   PLT 279.0 02/07/2018   Lab Results  Component Value Date   IRON 82 02/07/2018   TIBC 256 06/20/2012   FERRITIN 324 (H) 06/20/2012    He has been Using CPAP and this is being adjusted by pulmonologist.    He has had chronic lower urinary tract problems and BPH.  Prescriptions are being done by his urologist  HYPERLIPIDEMIA:     He is taking simvastatin for cardiovascular protection.  Has good levels of LDL and triglycerides consis as follows: T Ently   Lab Results  Component Value Date   CHOL 117 07/04/2017   HDL 44.60 07/04/2017   LDLCALC 55 07/04/2017   TRIG 88.0 07/04/2017   CHOLHDL 3 07/04/2017     Diabetic foot exam results in 10/18: normal monofilament sensation in the toes and plantar surfaces, no skin lesions or ulcers on the feet and normal pedal pulses   Examination:   BP (!) 144/70   Pulse 95   Ht 5\' 10"  (1.778 m)   Wt 196 lb 12.8 oz (89.3 kg)   SpO2 99%   BMI 28.24 kg/m   Body mass index is 28.24 kg/m.   No ankle edema present bilaterally   Assesment/PLAN:   1. Diabetes type 2 with obesity treated with basal bolus insulin only  See history of present illness for detailed discussion of current diabetes management, blood sugar patterns and problems identifie  Considering his age his A1c is still excellent at 6.8 although higher than before  He has fluctuation of his postprandial readings depending on his activity level and type of meal he is eating Most of his high readings are after supper and reminded him to take additional 2 to 4 units for larger meals or desserts Also if he is not planning to exercise he will need to take an extra 2 units at lunch and take 8 instead of 6 units Humalog No change in Lantus needed since fasting readings are mostly fairly good He can use the freestyle libre system but needs  to document monitoring 4 times a day first  2.   Chronic kidney disease: His creatinine is relatively worse but overall stable On next visit will need evaluation of PTH level  3.  Hypertension in the past: Blood pressure is upper normal without any medication However he does have history of microalbuminuria previously has had difficulties taking ARB drugs or diltiazem  4.  History of mild anemia,   likely related to chronic disease of renal insufficiency This is relatively worse but asymptomatic With need him to have PCP recheck it in October, consider ESA if getting below 10 We will continue iron   5.  EDEMA is controlled and he will continue 40 mg Lasix 5 days a week  6.    Weight loss: He still waiting for his general exam with PCP And in the meantime advised him to have a midafternoon snack with some protein since he does not eat large portions at meals  7.  He will have his flu shot at his PCP office  Total visit time for evaluation and management of multiple  problems and counseling =25 minutes  There are no Patient Instructions on file for this visit.      Elayne Snare  02/13/2018, 10:16 AM

## 2018-02-13 ENCOUNTER — Encounter: Payer: Self-pay | Admitting: Endocrinology

## 2018-02-14 ENCOUNTER — Other Ambulatory Visit: Payer: Self-pay | Admitting: Endocrinology

## 2018-02-15 ENCOUNTER — Other Ambulatory Visit: Payer: Self-pay | Admitting: Endocrinology

## 2018-02-24 DIAGNOSIS — E1121 Type 2 diabetes mellitus with diabetic nephropathy: Secondary | ICD-10-CM | POA: Diagnosis not present

## 2018-02-24 DIAGNOSIS — I10 Essential (primary) hypertension: Secondary | ICD-10-CM | POA: Diagnosis not present

## 2018-02-24 DIAGNOSIS — Z79899 Other long term (current) drug therapy: Secondary | ICD-10-CM | POA: Diagnosis not present

## 2018-02-24 DIAGNOSIS — Z23 Encounter for immunization: Secondary | ICD-10-CM | POA: Diagnosis not present

## 2018-02-24 DIAGNOSIS — N183 Chronic kidney disease, stage 3 (moderate): Secondary | ICD-10-CM | POA: Diagnosis not present

## 2018-02-24 DIAGNOSIS — J449 Chronic obstructive pulmonary disease, unspecified: Secondary | ICD-10-CM | POA: Diagnosis not present

## 2018-02-24 DIAGNOSIS — Z0001 Encounter for general adult medical examination with abnormal findings: Secondary | ICD-10-CM | POA: Diagnosis not present

## 2018-02-24 DIAGNOSIS — D631 Anemia in chronic kidney disease: Secondary | ICD-10-CM | POA: Diagnosis not present

## 2018-02-25 ENCOUNTER — Other Ambulatory Visit: Payer: Self-pay | Admitting: Endocrinology

## 2018-03-20 ENCOUNTER — Encounter: Payer: Self-pay | Admitting: *Deleted

## 2018-03-20 DIAGNOSIS — H52203 Unspecified astigmatism, bilateral: Secondary | ICD-10-CM | POA: Diagnosis not present

## 2018-03-20 DIAGNOSIS — Z961 Presence of intraocular lens: Secondary | ICD-10-CM | POA: Diagnosis not present

## 2018-03-20 DIAGNOSIS — H35371 Puckering of macula, right eye: Secondary | ICD-10-CM | POA: Diagnosis not present

## 2018-03-20 DIAGNOSIS — E113293 Type 2 diabetes mellitus with mild nonproliferative diabetic retinopathy without macular edema, bilateral: Secondary | ICD-10-CM | POA: Diagnosis not present

## 2018-03-20 LAB — HM DIABETES EYE EXAM

## 2018-03-31 ENCOUNTER — Other Ambulatory Visit: Payer: Self-pay | Admitting: Endocrinology

## 2018-04-18 DIAGNOSIS — Z955 Presence of coronary angioplasty implant and graft: Secondary | ICD-10-CM | POA: Insufficient documentation

## 2018-04-20 ENCOUNTER — Encounter (HOSPITAL_COMMUNITY)
Admission: EM | Disposition: A | Discharge status: Home Health | Payer: Self-pay | Source: Home / Self Care | Attending: Cardiology

## 2018-04-20 ENCOUNTER — Emergency Department (HOSPITAL_COMMUNITY): Payer: Medicare PPO

## 2018-04-20 ENCOUNTER — Other Ambulatory Visit: Payer: Self-pay

## 2018-04-20 ENCOUNTER — Inpatient Hospital Stay (HOSPITAL_COMMUNITY)
Admission: EM | Admit: 2018-04-20 | Discharge: 2018-04-26 | DRG: 246 | Disposition: A | Discharge status: Home Health | Payer: Medicare PPO | Attending: Cardiology | Admitting: Cardiology

## 2018-04-20 ENCOUNTER — Encounter (HOSPITAL_COMMUNITY): Payer: Self-pay | Admitting: Emergency Medicine

## 2018-04-20 DIAGNOSIS — D649 Anemia, unspecified: Secondary | ICD-10-CM

## 2018-04-20 DIAGNOSIS — R0989 Other specified symptoms and signs involving the circulatory and respiratory systems: Secondary | ICD-10-CM

## 2018-04-20 DIAGNOSIS — R001 Bradycardia, unspecified: Secondary | ICD-10-CM

## 2018-04-20 DIAGNOSIS — J440 Chronic obstructive pulmonary disease with acute lower respiratory infection: Secondary | ICD-10-CM | POA: Diagnosis not present

## 2018-04-20 DIAGNOSIS — I2111 ST elevation (STEMI) myocardial infarction involving right coronary artery: Secondary | ICD-10-CM

## 2018-04-20 DIAGNOSIS — G4733 Obstructive sleep apnea (adult) (pediatric): Secondary | ICD-10-CM | POA: Diagnosis present

## 2018-04-20 DIAGNOSIS — I441 Atrioventricular block, second degree: Secondary | ICD-10-CM | POA: Diagnosis not present

## 2018-04-20 DIAGNOSIS — M810 Age-related osteoporosis without current pathological fracture: Secondary | ICD-10-CM | POA: Diagnosis not present

## 2018-04-20 DIAGNOSIS — I2119 ST elevation (STEMI) myocardial infarction involving other coronary artery of inferior wall: Secondary | ICD-10-CM | POA: Diagnosis not present

## 2018-04-20 DIAGNOSIS — I25118 Atherosclerotic heart disease of native coronary artery with other forms of angina pectoris: Secondary | ICD-10-CM | POA: Diagnosis not present

## 2018-04-20 DIAGNOSIS — I213 ST elevation (STEMI) myocardial infarction of unspecified site: Secondary | ICD-10-CM | POA: Diagnosis not present

## 2018-04-20 DIAGNOSIS — R9431 Abnormal electrocardiogram [ECG] [EKG]: Secondary | ICD-10-CM | POA: Diagnosis present

## 2018-04-20 DIAGNOSIS — Z87891 Personal history of nicotine dependence: Secondary | ICD-10-CM

## 2018-04-20 DIAGNOSIS — I4430 Unspecified atrioventricular block: Secondary | ICD-10-CM | POA: Diagnosis not present

## 2018-04-20 DIAGNOSIS — N184 Chronic kidney disease, stage 4 (severe): Secondary | ICD-10-CM | POA: Diagnosis not present

## 2018-04-20 DIAGNOSIS — I2510 Atherosclerotic heart disease of native coronary artery without angina pectoris: Secondary | ICD-10-CM

## 2018-04-20 DIAGNOSIS — Z833 Family history of diabetes mellitus: Secondary | ICD-10-CM | POA: Diagnosis not present

## 2018-04-20 DIAGNOSIS — Z79899 Other long term (current) drug therapy: Secondary | ICD-10-CM | POA: Diagnosis not present

## 2018-04-20 DIAGNOSIS — J449 Chronic obstructive pulmonary disease, unspecified: Secondary | ICD-10-CM

## 2018-04-20 DIAGNOSIS — Z8679 Personal history of other diseases of the circulatory system: Secondary | ICD-10-CM | POA: Diagnosis not present

## 2018-04-20 DIAGNOSIS — E1122 Type 2 diabetes mellitus with diabetic chronic kidney disease: Secondary | ICD-10-CM | POA: Diagnosis present

## 2018-04-20 DIAGNOSIS — I255 Ischemic cardiomyopathy: Secondary | ICD-10-CM | POA: Diagnosis not present

## 2018-04-20 DIAGNOSIS — R570 Cardiogenic shock: Secondary | ICD-10-CM | POA: Diagnosis present

## 2018-04-20 DIAGNOSIS — R079 Chest pain, unspecified: Secondary | ICD-10-CM | POA: Diagnosis present

## 2018-04-20 DIAGNOSIS — N183 Chronic kidney disease, stage 3 (moderate): Secondary | ICD-10-CM | POA: Diagnosis present

## 2018-04-20 DIAGNOSIS — I4891 Unspecified atrial fibrillation: Secondary | ICD-10-CM | POA: Diagnosis not present

## 2018-04-20 DIAGNOSIS — D638 Anemia in other chronic diseases classified elsewhere: Secondary | ICD-10-CM | POA: Diagnosis present

## 2018-04-20 DIAGNOSIS — I129 Hypertensive chronic kidney disease with stage 1 through stage 4 chronic kidney disease, or unspecified chronic kidney disease: Secondary | ICD-10-CM | POA: Diagnosis present

## 2018-04-20 DIAGNOSIS — R42 Dizziness and giddiness: Secondary | ICD-10-CM | POA: Diagnosis not present

## 2018-04-20 DIAGNOSIS — Z794 Long term (current) use of insulin: Secondary | ICD-10-CM

## 2018-04-20 DIAGNOSIS — I25119 Atherosclerotic heart disease of native coronary artery with unspecified angina pectoris: Secondary | ICD-10-CM

## 2018-04-20 DIAGNOSIS — I219 Acute myocardial infarction, unspecified: Secondary | ICD-10-CM

## 2018-04-20 DIAGNOSIS — Z7951 Long term (current) use of inhaled steroids: Secondary | ICD-10-CM | POA: Diagnosis not present

## 2018-04-20 DIAGNOSIS — E785 Hyperlipidemia, unspecified: Secondary | ICD-10-CM | POA: Diagnosis present

## 2018-04-20 DIAGNOSIS — J189 Pneumonia, unspecified organism: Secondary | ICD-10-CM | POA: Diagnosis not present

## 2018-04-20 DIAGNOSIS — J9811 Atelectasis: Secondary | ICD-10-CM | POA: Diagnosis not present

## 2018-04-20 DIAGNOSIS — R000 Tachycardia, unspecified: Secondary | ICD-10-CM | POA: Diagnosis not present

## 2018-04-20 DIAGNOSIS — R739 Hyperglycemia, unspecified: Secondary | ICD-10-CM | POA: Diagnosis not present

## 2018-04-20 HISTORY — PX: LEFT HEART CATH AND CORONARY ANGIOGRAPHY: CATH118249

## 2018-04-20 HISTORY — PX: CORONARY STENT INTERVENTION: CATH118234

## 2018-04-20 HISTORY — PX: CORONARY/GRAFT ACUTE MI REVASCULARIZATION: CATH118305

## 2018-04-20 LAB — LIPID PANEL
CHOL/HDL RATIO: 3.3 ratio
Cholesterol: 121 mg/dL (ref 0–200)
HDL: 37 mg/dL — AB (ref >40)
LDL Cholesterol: 62 mg/dL (ref 0–99)
TRIGLYCERIDES: 111 mg/dL (ref <150)
VLDL: 22 mg/dL (ref 0–40)

## 2018-04-20 LAB — COMPREHENSIVE METABOLIC PANEL
ALK PHOS: 60 U/L (ref 38–126)
ALT: 15 U/L (ref 0–44)
AST: 39 U/L (ref 15–41)
Albumin: 3.8 g/dL (ref 3.5–5.0)
Anion gap: 11 (ref 5–15)
BUN: 51 mg/dL — AB (ref 8–23)
CALCIUM: 9.4 mg/dL (ref 8.9–10.3)
CHLORIDE: 107 mmol/L (ref 98–111)
CO2: 23 mmol/L (ref 22–32)
CREATININE: 2.63 mg/dL — AB (ref 0.61–1.24)
GFR calc Af Amer: 23 mL/min — ABNORMAL LOW (ref >60)
GFR, EST NON AFRICAN AMERICAN: 20 mL/min — AB (ref >60)
Glucose, Bld: 201 mg/dL — ABNORMAL HIGH (ref 70–99)
Potassium: 3.6 mmol/L (ref 3.5–5.1)
SODIUM: 141 mmol/L (ref 135–145)
Total Bilirubin: 0.6 mg/dL (ref 0.3–1.2)
Total Protein: 6.9 g/dL (ref 6.5–8.1)

## 2018-04-20 LAB — GLUCOSE, CAPILLARY
Glucose-Capillary: 275 mg/dL — ABNORMAL HIGH (ref 70–99)
Glucose-Capillary: 359 mg/dL — ABNORMAL HIGH (ref 70–99)

## 2018-04-20 LAB — SAMPLE TO BLOOD BANK

## 2018-04-20 LAB — CBC WITH DIFFERENTIAL/PLATELET
Abs Immature Granulocytes: 0.08 10*3/uL — ABNORMAL HIGH (ref 0.00–0.07)
BASOS PCT: 0 %
Basophils Absolute: 0.1 10*3/uL (ref 0.0–0.1)
EOS ABS: 0.1 10*3/uL (ref 0.0–0.5)
Eosinophils Relative: 1 %
HEMATOCRIT: 37.8 % — AB (ref 39.0–52.0)
Hemoglobin: 11.2 g/dL — ABNORMAL LOW (ref 13.0–17.0)
Immature Granulocytes: 1 %
LYMPHS ABS: 1.6 10*3/uL (ref 0.7–4.0)
Lymphocytes Relative: 11 %
MCH: 30.4 pg (ref 26.0–34.0)
MCHC: 29.6 g/dL — ABNORMAL LOW (ref 30.0–36.0)
MCV: 102.4 fL — AB (ref 80.0–100.0)
MONO ABS: 0.6 10*3/uL (ref 0.1–1.0)
MONOS PCT: 4 %
NEUTROS PCT: 83 %
Neutro Abs: 12.4 10*3/uL — ABNORMAL HIGH (ref 1.7–7.7)
PLATELETS: 282 10*3/uL (ref 150–400)
RBC: 3.69 MIL/uL — ABNORMAL LOW (ref 4.22–5.81)
RDW: 12.8 % (ref 11.5–15.5)
WBC: 14.9 10*3/uL — ABNORMAL HIGH (ref 4.0–10.5)
nRBC: 0 % (ref 0.0–0.2)

## 2018-04-20 LAB — TROPONIN I
Troponin I: 1.04 ng/mL (ref <0.03)
Troponin I: 11.34 ng/mL (ref <0.03)

## 2018-04-20 LAB — PROTIME-INR
INR: 1.09
Prothrombin Time: 14 seconds (ref 11.4–15.2)

## 2018-04-20 LAB — APTT: aPTT: 29 seconds (ref 24–36)

## 2018-04-20 SURGERY — CORONARY/GRAFT ACUTE MI REVASCULARIZATION
Anesthesia: LOCAL

## 2018-04-20 MED ORDER — NITROGLYCERIN 1 MG/10 ML FOR IR/CATH LAB
INTRA_ARTERIAL | Status: DC | PRN
  Administered 2018-04-20: 200 ug via INTRACORONARY

## 2018-04-20 MED ORDER — TICAGRELOR 90 MG PO TABS
ORAL_TABLET | ORAL | Status: AC
  Filled 2018-04-20: qty 2

## 2018-04-20 MED ORDER — SODIUM CHLORIDE 0.9% FLUSH
3.0000 mL | Freq: Two times a day (BID) | INTRAVENOUS | Status: DC
  Administered 2018-04-20 – 2018-04-25 (×10): 3 mL via INTRAVENOUS

## 2018-04-20 MED ORDER — FERROUS SULFATE 325 (65 FE) MG PO TABS
325.0000 mg | ORAL_TABLET | Freq: Every day | ORAL | Status: DC
  Administered 2018-04-21 – 2018-04-26 (×6): 325 mg via ORAL
  Filled 2018-04-20 (×6): qty 1

## 2018-04-20 MED ORDER — HEPARIN (PORCINE) IN NACL 100-0.45 UNIT/ML-% IJ SOLN: 1200.0000 [IU]/h | INTRAMUSCULAR | Status: DC

## 2018-04-20 MED ORDER — SODIUM CHLORIDE 0.9% FLUSH: 3.0000 mL | INTRAVENOUS | Status: DC | PRN

## 2018-04-20 MED ORDER — INSULIN GLARGINE 100 UNIT/ML ~~LOC~~ SOLN
20.0000 [IU] | Freq: Every day | SUBCUTANEOUS | Status: DC
  Filled 2018-04-20: qty 0.2

## 2018-04-20 MED ORDER — VERAPAMIL HCL 2.5 MG/ML IV SOLN
INTRAVENOUS | Status: AC
  Filled 2018-04-20: qty 2

## 2018-04-20 MED ORDER — IOHEXOL 350 MG/ML SOLN
INTRAVENOUS | Status: DC | PRN
  Administered 2018-04-20: 105 mL via INTRAVENOUS

## 2018-04-20 MED ORDER — SODIUM CHLORIDE 0.9 % IV SOLN
INTRAVENOUS | Status: AC | PRN
  Administered 2018-04-20: 10 mL/h via INTRAVENOUS

## 2018-04-20 MED ORDER — ASPIRIN 81 MG PO CHEW
81.0000 mg | CHEWABLE_TABLET | Freq: Every day | ORAL | Status: DC
  Administered 2018-04-21 – 2018-04-26 (×6): 81 mg via ORAL
  Filled 2018-04-20 (×6): qty 1

## 2018-04-20 MED ORDER — MOMETASONE FURO-FORMOTEROL FUM 200-5 MCG/ACT IN AERO
2.0000 | INHALATION_SPRAY | Freq: Two times a day (BID) | RESPIRATORY_TRACT | Status: DC
  Administered 2018-04-20 – 2018-04-26 (×12): 2 via RESPIRATORY_TRACT
  Filled 2018-04-20: qty 8.8

## 2018-04-20 MED ORDER — INSULIN ASPART 100 UNIT/ML ~~LOC~~ SOLN
0.0000 [IU] | Freq: Three times a day (TID) | SUBCUTANEOUS | Status: DC
  Administered 2018-04-21: 8 [IU] via SUBCUTANEOUS
  Administered 2018-04-21: 5 [IU] via SUBCUTANEOUS
  Administered 2018-04-21: 11 [IU] via SUBCUTANEOUS
  Administered 2018-04-22: 5 [IU] via SUBCUTANEOUS
  Administered 2018-04-22: 3 [IU] via SUBCUTANEOUS
  Administered 2018-04-22: 5 [IU] via SUBCUTANEOUS
  Administered 2018-04-23: 3 [IU] via SUBCUTANEOUS
  Administered 2018-04-23: 2 [IU] via SUBCUTANEOUS
  Administered 2018-04-23: 3 [IU] via SUBCUTANEOUS
  Administered 2018-04-24: 2 [IU] via SUBCUTANEOUS
  Administered 2018-04-24: 3 [IU] via SUBCUTANEOUS
  Administered 2018-04-25: 2 [IU] via SUBCUTANEOUS
  Administered 2018-04-25: 3 [IU] via SUBCUTANEOUS

## 2018-04-20 MED ORDER — HEPARIN SODIUM (PORCINE) 5000 UNIT/ML IJ SOLN
60.0000 [IU]/kg | Freq: Once | INTRAMUSCULAR | Status: AC
  Administered 2018-04-20: 4000 [IU] via INTRAVENOUS

## 2018-04-20 MED ORDER — ASPIRIN 81 MG PO CHEW
324.0000 mg | CHEWABLE_TABLET | Freq: Once | ORAL | Status: AC
  Administered 2018-04-20: 324 mg via ORAL

## 2018-04-20 MED ORDER — TICAGRELOR 90 MG PO TABS
90.0000 mg | ORAL_TABLET | Freq: Two times a day (BID) | ORAL | Status: DC
  Administered 2018-04-21 – 2018-04-26 (×11): 90 mg via ORAL
  Filled 2018-04-20 (×13): qty 1

## 2018-04-20 MED ORDER — HEPARIN SODIUM (PORCINE) 5000 UNIT/ML IJ SOLN
INTRAMUSCULAR | Status: AC
  Filled 2018-04-20: qty 1

## 2018-04-20 MED ORDER — SODIUM CHLORIDE 0.9 % IV SOLN: INTRAVENOUS | Status: AC

## 2018-04-20 MED ORDER — CANGRELOR BOLUS VIA INFUSION
INTRAVENOUS | Status: DC | PRN
  Administered 2018-04-20: 2655 ug via INTRAVENOUS

## 2018-04-20 MED ORDER — MONTELUKAST SODIUM 10 MG PO TABS
10.0000 mg | ORAL_TABLET | Freq: Every day | ORAL | Status: DC
  Administered 2018-04-20 – 2018-04-25 (×6): 10 mg via ORAL
  Filled 2018-04-20 (×6): qty 1

## 2018-04-20 MED ORDER — ALBUTEROL SULFATE (2.5 MG/3ML) 0.083% IN NEBU
2.5000 mg | INHALATION_SOLUTION | Freq: Four times a day (QID) | RESPIRATORY_TRACT | Status: DC | PRN
  Administered 2018-04-21 – 2018-04-23 (×3): 2.5 mg via RESPIRATORY_TRACT
  Filled 2018-04-20 (×4): qty 3

## 2018-04-20 MED ORDER — NOREPINEPHRINE 4 MG/250ML-% IV SOLN
2.0000 ug/min | INTRAVENOUS | Status: DC
  Administered 2018-04-21: 5 ug/min via INTRAVENOUS
  Filled 2018-04-20: qty 250

## 2018-04-20 MED ORDER — FAMOTIDINE 20 MG PO TABS
20.0000 mg | ORAL_TABLET | Freq: Every day | ORAL | Status: DC
  Administered 2018-04-20 – 2018-04-25 (×6): 20 mg via ORAL
  Filled 2018-04-20 (×6): qty 1

## 2018-04-20 MED ORDER — SODIUM CHLORIDE 0.9 % IV SOLN
INTRAVENOUS | Status: AC | PRN
  Administered 2018-04-20: 4 ug/kg/min via INTRAVENOUS

## 2018-04-20 MED ORDER — CANGRELOR TETRASODIUM 50 MG IV SOLR
INTRAVENOUS | Status: AC
  Filled 2018-04-20: qty 50

## 2018-04-20 MED ORDER — HEPARIN (PORCINE) IN NACL 1000-0.9 UT/500ML-% IV SOLN
INTRAVENOUS | Status: AC
  Filled 2018-04-20: qty 500

## 2018-04-20 MED ORDER — VERAPAMIL HCL 2.5 MG/ML IV SOLN
INTRAVENOUS | Status: DC | PRN
  Administered 2018-04-20: 10 mL via INTRA_ARTERIAL

## 2018-04-20 MED ORDER — NOREPINEPHRINE 4 MG/250ML-% IV SOLN
INTRAVENOUS | Status: AC
  Filled 2018-04-20: qty 250

## 2018-04-20 MED ORDER — SODIUM CHLORIDE 0.9 % IV SOLN
INTRAVENOUS | Status: DC
  Administered 2018-04-20: 16:00:00 via INTRAVENOUS

## 2018-04-20 MED ORDER — SODIUM CHLORIDE 0.9 % IV SOLN
250.0000 mL | INTRAVENOUS | Status: DC | PRN
  Administered 2018-04-21: 250 mL via INTRAVENOUS

## 2018-04-20 MED ORDER — HEPARIN (PORCINE) IN NACL 1000-0.9 UT/500ML-% IV SOLN
INTRAVENOUS | Status: DC | PRN
  Administered 2018-04-20 (×2): 500 mL

## 2018-04-20 MED ORDER — ATORVASTATIN CALCIUM 80 MG PO TABS
80.0000 mg | ORAL_TABLET | Freq: Every day | ORAL | Status: DC
  Administered 2018-04-21 – 2018-04-25 (×5): 80 mg via ORAL
  Filled 2018-04-20 (×5): qty 1

## 2018-04-20 MED ORDER — NOREPINEPHRINE BITARTRATE 1 MG/ML IV SOLN
INTRAVENOUS | Status: AC | PRN
  Administered 2018-04-20: 10 ug/kg/min via INTRAVENOUS

## 2018-04-20 MED ORDER — BIVALIRUDIN TRIFLUOROACETATE 250 MG IV SOLR
INTRAVENOUS | Status: AC
  Filled 2018-04-20: qty 250

## 2018-04-20 MED ORDER — HEPARIN SODIUM (PORCINE) 1000 UNIT/ML IJ SOLN
INTRAMUSCULAR | Status: DC | PRN
  Administered 2018-04-20 (×2): 4000 [IU] via INTRAVENOUS

## 2018-04-20 MED ORDER — ONDANSETRON HCL 4 MG/2ML IJ SOLN: 4.0000 mg | Freq: Four times a day (QID) | INTRAMUSCULAR | Status: DC | PRN

## 2018-04-20 MED ORDER — INSULIN ASPART 100 UNIT/ML ~~LOC~~ SOLN
0.0000 [IU] | Freq: Every day | SUBCUTANEOUS | Status: DC
  Administered 2018-04-20: 5 [IU] via SUBCUTANEOUS
  Administered 2018-04-21: 2 [IU] via SUBCUTANEOUS

## 2018-04-20 MED ORDER — MELATONIN 3 MG PO TABS
3.0000 mg | ORAL_TABLET | Freq: Every day | ORAL | Status: DC
  Administered 2018-04-20 – 2018-04-25 (×6): 3 mg via ORAL
  Filled 2018-04-20 (×6): qty 1

## 2018-04-20 MED ORDER — SODIUM CHLORIDE 0.9 % IV SOLN
INTRAVENOUS | Status: DC | PRN
  Administered 2018-04-20: 10 mL/h via INTRAVENOUS

## 2018-04-20 MED ORDER — LIDOCAINE HCL (PF) 1 % IJ SOLN
INTRAMUSCULAR | Status: DC | PRN
  Administered 2018-04-20: 5 mL
  Administered 2018-04-20: 12 mL

## 2018-04-20 MED ORDER — TICAGRELOR 90 MG PO TABS
ORAL_TABLET | ORAL | Status: DC | PRN
  Administered 2018-04-20: 180 mg via ORAL

## 2018-04-20 MED ORDER — ACETAMINOPHEN 325 MG PO TABS: 650.0000 mg | ORAL_TABLET | ORAL | Status: DC | PRN

## 2018-04-20 MED ORDER — ASPIRIN 81 MG PO CHEW
CHEWABLE_TABLET | ORAL | Status: AC
  Filled 2018-04-20: qty 4

## 2018-04-20 SURGICAL SUPPLY — 22 items
BALLN SAPPHIRE 2.5X12 (BALLOONS) ×3
BALLN SAPPHIRE ~~LOC~~ 3.5X18 (BALLOONS) ×1 IMPLANT
BALLOON SAPPHIRE 2.5X12 (BALLOONS) IMPLANT
CABLE ADAPT CONN TEMP 6FT (ADAPTER) ×1 IMPLANT
CATH INFINITI 5FR MULTPACK ANG (CATHETERS) ×1 IMPLANT
CATH S G BIP PACING (SET/KITS/TRAYS/PACK) ×1 IMPLANT
CATH VISTA GUIDE 6FR JR4 (CATHETERS) ×1 IMPLANT
DEVICE RAD COMP TR BAND LRG (VASCULAR PRODUCTS) ×1 IMPLANT
GLIDESHEATH SLEND SS 6F .021 (SHEATH) ×1 IMPLANT
GUIDEWIRE INQWIRE 1.5J.035X260 (WIRE) IMPLANT
INQWIRE 1.5J .035X260CM (WIRE) ×3
KIT ENCORE 26 ADVANTAGE (KITS) ×1 IMPLANT
KIT HEART LEFT (KITS) ×3 IMPLANT
KIT MICROPUNCTURE NIT STIFF (SHEATH) ×1 IMPLANT
PACK CARDIAC CATHETERIZATION (CUSTOM PROCEDURE TRAY) ×3 IMPLANT
SHEATH PINNACLE 6F 10CM (SHEATH) ×1 IMPLANT
SHIELD RADPAD SCOOP 12X17 (MISCELLANEOUS) ×1 IMPLANT
STENT SYNERGY DES 3X38 (Permanent Stent) ×1 IMPLANT
TRANSDUCER W/STOPCOCK (MISCELLANEOUS) ×3 IMPLANT
TUBING CIL FLEX 10 FLL-RA (TUBING) ×3 IMPLANT
WIRE ASAHI PROWATER 180CM (WIRE) ×1 IMPLANT
WIRE EMERALD 3MM-J .035X150CM (WIRE) ×1 IMPLANT

## 2018-04-20 NOTE — ED Triage Notes (Signed)
Pt arrived [facility] from [facility] Code STEMI. Pt arrived to [facility] for c/o dizziness and was found to have pneumonia with CXR. Pt has hx of pneumonia unk date. Pt given decadron, IV rocephin, and neb treatment PTA at UC. Denies CP. 20RFA BP 127/43 P 86 RR 18

## 2018-04-20 NOTE — Progress Notes (Deleted)
ANTICOAGULATION CONSULT NOTE - Initial Consult  Pharmacy Consult for Heparin  Indication: ACS/STEMI  Allergies  Allergen Reactions  . Doxycycline Rash  . Levaquin [Levofloxacin In D5w]     Caused chest pain and heartburn  . Nitrous Oxide Other (See Comments)    Reaction:  Unknown   . Penicillins Other (See Comments)    Reaction:  Unknown Has patient had a PCN reaction causing immediate rash, facial/tongue/throat swelling, SOB or lightheadedness with hypotension: Unsure Has patient had a PCN reaction causing severe rash involving mucus membranes or skin necrosis: Unsure Has patient had a PCN reaction that required hospitalization Unsure  Has patient had a PCN reaction occurring within the last 10 years: No If all of the above answers are "NO", then may proceed with Cephalosporin use.  . Tape Other (See Comments)    Reaction:  Tears pts skin   . Diltiazem Rash    Patient Measurements: Height: 5\' 10"  (177.8 cm) Weight: 195 lb (88.5 kg) IBW/kg (Calculated) : 73 Heparin Dosing Weight:  88.5 kg  Vital Signs: Temp: 97.8 F (36.6 C) (11/03 1537) Temp Source: Oral (11/03 1537) BP: 103/43 (11/03 1533) Pulse Rate: 50 (11/03 1537)  Labs: Recent Labs    04/20/18 1533  HGB 11.2*  HCT 37.8*  PLT 282  APTT 29  LABPROT 14.0  INR 1.09  CREATININE 2.63*  TROPONINI 1.04*    Estimated Creatinine Clearance: 21.7 mL/min (A) (by C-G formula based on SCr of 2.63 mg/dL (H)).   Medical History: Past Medical History:  Diagnosis Date  . Anemia   . Chronic renal insufficiency   . DDD (degenerative disc disease), lumbar   . Diabetes mellitus   . Hypertension   . Neuromuscular disorder ([place])   . Osteoporosis     Medications:  Follow up PTA medication   Assessment: 84 y.o male to start on IV Heparin for ACS / Inferior STEMI , to start  heparin 8h post sheath removal. S/p cardiac cath. INR 1.09, aptt 29 baseline  [id number] wnl, hgb 11.2  Scr 2.63 Ticagrelor load in cath lab  11/3  RN reports venous sheath in R femoral and TR band left radial. RN will notify pharmacy when sheath removed.    Goal of Therapy:  INR 2-3 Monitor platelets by anticoagulation protocol: Yes   Plan:  Heparin infusion to start 8h post sheath removal. Sheath still in place.  RN will notify pharmacy when sheath removed.  Will plan to start IV heparin drip at 1200 units/hr (~14 units/kg/hr)  Check 6 hour heparin level  (no bolus given) Daily heparin level and CBC   Nicole Cella, RPh Clinical Pharmacist Please check AMION for all [place] phone numbers After 10:00 PM, call [place] [phone] 04/20/2018,5:59 PM   Addum:  Start heparin at 01:00 at 1200 units/hr.  Check HL at 0800. Excell Seltzer, PharmD

## 2018-04-20 NOTE — ED Notes (Signed)
Cardiology at bedside.

## 2018-04-20 NOTE — H&P (Signed)
Cardiology Admission History and Physical:   Patient ID: Clayton [name] MRN: [id number]; DOB: 1928/03/01   Admission date: 04/20/2018  Primary Care Provider: Lujean Amel, MD Primary Cardiologist: No primary care provider on file.  None, new Primary Electrophysiologist:  None none  Chief Complaint: Dizziness  Patient Profile:   Clayton [name] is a 84 y.o. male with no prior cardiac history who was in his usual state of health until this morning at roughly 5 AM.  Transferred from urgent care to [facility] for possible pneumonia, found to have inferior ST elevations.  History of Present Illness:   Clayton [name] is a relatively robust 84 year old gentleman with possible CKD reported) creatinine from 2 years ago was roughly 2) but no active cardiac history besides first-degree AV block who was well until roughly 5-530 this morning when he started just noticing just not feeling very well.  He felt dizzy and lightheaded but with no nausea.  No chest pain.  May be a little bit "short of breath".  He felt bad most the morning and then when things did not get better by this afternoon he decided go to urgent care.  Upon arrival at urgent care he was given a tentative diagnosis of pneumonia and sent to [facility] emergency room.  EMS on transport thought they saw inferior ST elevations and followed serial EKGs.  Upon arrival to [facility] ER parking area, code STEMI was called for roughly 2 mm ST elevations in II, III and aVF.  ST depressions in ruminal 1 and aVL.  Upon my evaluation in the ER, he was not having any chest pain.  Noted mild dyspnea but no nausea actual syncope or near syncope.  No antecedent PND orthopnea or edema.  No sensation of irregular heartbeats.  No prior resting or exertional chest pain or dyspnea.   Past Medical History:  Diagnosis Date  . Anemia   . Chronic renal insufficiency   . DDD (degenerative disc disease), lumbar   . Diabetes mellitus   . Hypertension     . Neuromuscular disorder ([place])   . Osteoporosis     Past Surgical History:  Procedure Laterality Date  . EYE SURGERY    . I&D EXTREMITY Right 08/13/2016   Procedure: IRRIGATION AND DEBRIDEMENT RIGHT ELBOW AND HAND;  Surgeon: Milly Jakob, MD;  Location: [facility];  Service: Orthopedics;  Laterality: Right;  Marland Kitchen VASECTOMY    . VIDEO BRONCHOSCOPY Bilateral 08/25/2012   Procedure: VIDEO BRONCHOSCOPY WITHOUT FLUORO;  Surgeon: Brand Males, MD;  Location: [place];  Service: Cardiopulmonary;  Laterality: Bilateral;     Medications Prior to Admission: Prior to Admission medications   Medication Sig Start Date End Date Taking? Authorizing Provider  ACCU-CHEK SOFTCLIX LANCETS lancets TEST AS INSTRUCTED TO OBTAIN A BLOOD SPECIMEN 4 TIMES DAILY 02/26/18   Elayne Snare, MD  albuterol (PROVENTIL HFA;VENTOLIN HFA) 108 (90 Base) MCG/ACT inhaler Inhale 1-2 puffs into the lungs every 6 (six) hours as needed for wheezing or shortness of breath.    [provider]  Alcohol Swabs (B-D SINGLE USE SWABS REGULAR) PADS USE 7 SWABS DAILY AS DIRECTED 02/18/18   Elayne Snare, MD  azithromycin (ZITHROMAX) 250 MG tablet Take 250 mg by mouth daily. Takes after dinner    [provider]  budesonide-formoterol (SYMBICORT) 160-4.5 MCG/ACT inhaler Inhale 2 puffs into the lungs 2 (two) times daily. 07/29/13   Tanda Rockers, MD  cholecalciferol (VITAMIN D) 1000 units tablet Take 2,000 Units by mouth daily.  [provider]  diphenhydramine-acetaminophen (TYLENOL PM) 25-500 MG TABS tablet Take 2 tablets by mouth at bedtime as needed.    [provider]  famotidine (PEPCID) 20 MG tablet Take 20 mg by mouth at bedtime.    [provider]  ferrous sulfate 325 (65 FE) MG tablet Take 325 mg by mouth daily with breakfast.    [provider]  finasteride (PROSCAR) 5 MG tablet TAKE 1 TABLET (5 MG TOTAL) BY MOUTH DAILY. 02/19/18   Elayne Snare, MD  furosemide (LASIX) 20 MG tablet  Take 1 tablet (20 mg total) by mouth daily. Take 2 tabs 5 times weekly. 10/31/17   Elayne Snare, MD  glucose blood (ACCU-CHEK AVIVA PLUS) test strip 1 each by Other route daily. Use as instructed to check blood sugar 4 times daily. 10/03/17   Elayne Snare, MD  insulin aspart (NOVOLOG) 100 UNIT/ML injection TAKE 8 UNITS UNDER THE SKIN DAILY Monday-Friday AND ON Saturday AND Sunday TAKE 6 UNITS AT [facility] AND LUNCH, AND 8 UNITS AT DINNER. 12/04/17   Elayne Snare, MD  insulin glargine (LANTUS) 100 UNIT/ML injection INJECT 24 UNITS SUBCUTANEOUSLY IN THE MORNING AND 20 UNITS IN THE EVENING 09/18/17   Elayne Snare, MD  Insulin Syringe-Needle U-100 (BD INSULIN SYRINGE U/F) 31G X 5/16" 0.3 ML MISC USE THREE TIMES DAILY 02/12/18   Elayne Snare, MD  MELATONIN PO Take 1 tablet by mouth at bedtime.    [provider]  montelukast (SINGULAIR) 10 MG tablet Take 10 mg by mouth at bedtime.    [provider]  Multiple Vitamin (MULTIVITAMIN WITH MINERALS) TABS tablet Take 1 tablet by mouth daily.    [provider]  omeprazole (PRILOSEC) 20 MG capsule Take 20 mg by mouth 2 (two) times daily before a meal.    [provider]  psyllium (REGULOID) 0.52 g capsule Take 0.52 g by mouth daily.    [provider]  simvastatin (ZOCOR) 20 MG tablet TAKE 1 TABLET EVERY DAY 03/31/18   Elayne Snare, MD  TOVIAZ 8 MG TB24 tablet Take 8 mg by mouth at bedtime.     [provider]     Allergies:    Allergies  Allergen Reactions  . Doxycycline Rash  . Levaquin [Levofloxacin In D5w]     Caused chest pain and heartburn  . Nitrous Oxide Other (See Comments)    Reaction:  Unknown   . Penicillins Other (See Comments)    Reaction:  Unknown Has patient had a PCN reaction causing immediate rash, facial/tongue/throat swelling, SOB or lightheadedness with hypotension: Unsure Has patient had a PCN reaction causing severe rash involving mucus membranes or skin necrosis: Unsure Has patient had  a PCN reaction that required hospitalization Unsure  Has patient had a PCN reaction occurring within the last 10 years: No If all of the above answers are "NO", then may proceed with Cephalosporin use.  . Tape Other (See Comments)    Reaction:  Tears pts skin   . Diltiazem Rash    Social History:   Social History   Socioeconomic History  . Marital status: Widowed    Spouse name: Not on file  . Number of children: Not on file  . Years of education: Not on file  . Highest education level: Not on file  Occupational History  . Not on file  Social Needs  . Financial resource strain: Not on file  . Food insecurity:    Worry: Not on file    Inability:  Not on file  . Transportation needs:    Medical: Not on file    Non-medical: Not on file  Tobacco Use  . Smoking status: Former Smoker    Packs/day: 3.00    Years: 33.00    Pack years: 99.00    Types: Cigarettes    Last attempt to quit: 06/19/1973    Years since quitting: 44.8  . Smokeless tobacco: Former Systems developer    Quit date: 09/26/1975  Substance and Sexual Activity  . Alcohol use: No  . Drug use: No  . Sexual activity: Never  Lifestyle  . Physical activity:    Days per week: Not on file    Minutes per session: Not on file  . Stress: Not on file  Relationships  . Social connections:    Talks on phone: Not on file    Gets together: Not on file    Attends religious service: Not on file    Active member of club or organization: Not on file    Attends meetings of clubs or organizations: Not on file    Relationship status: Not on file  . Intimate partner violence:    Fear of current or ex partner: Not on file    Emotionally abused: Not on file    Physically abused: Not on file    Forced sexual activity: Not on file  Other Topics Concern  . Not on file  Social History Narrative  . Not on file    Family History:  Non-contributory  The patient's family history includes Diabetes Mellitus II in his unknown relative;  Hypertension in his unknown relative.    ROS:  Please see the history of present illness.  All other ROS reviewed and negative.     Physical Exam/Data:   Vitals:   04/20/18 1533 04/20/18 1536 04/20/18 1537  BP: (!) 103/43    Pulse: (!) 50  (!) 50  Resp:   13  Temp: (!) 96.2 F (35.7 C)  97.8 F (36.6 C)  TempSrc: Temporal  Oral  SpO2:   98%  Weight:  88.5 kg   Height:  5\' 10"  (1.778 m)    No intake or output data in the 24 hours ending 04/20/18 1600 Filed Weights   04/20/18 1536  Weight: 88.5 kg   Body mass index is 27.98 kg/m.  General:  Well nourished, well developed, in no acute distress -elderly appearing.  But relatively healthy HEENT: normal Lymph: no adenopathy Neck: no JVD or carotid bruit.  There are some jugular venous pulsations noted but none elevated. Endocrine:  No thryomegaly Vascular: No carotid bruits; FA pulses 2+ bilaterally without bruits  Cardiac:  normal S1, S2; bradycardic with RR; no murmur Lungs: Mild diffuse coarse upper respiratory sounds, no obvious rales or rhonchi. Abd: soft, nontender, no hepatomegaly  Ext: no clubbing cyanosis or edema Musculoskeletal:  No deformities, BUE and BLE strength normal and equal Skin: warm and dry  Neuro:  CNs 2-12 intact, no focal abnormalities noted Psych:  Normal affect    EKG:  The ECG that was done in the ER.  Was personally reviewed and demonstrates sinus bradycardia, rate 51 bpm.  Prolonged PR interval.  Cannot exclude 2-1 AV block.  2 mm ST elevations in ruminal 2, 3, aVF with ST depressions in aVL.  Subtle upsloping elevations in V5 and V6.  Relevant CV Studies:  2D echo from January 2014 showed normal EF 55 to 60%.  No wall motion normalities.  GR 1 DD.  No significant valve disease.  Laboratory Data:  ChemistryNo results for input(s): NA, K, CL, CO2, GLUCOSE, BUN, CREATININE, CALCIUM, GFRNONAA, GFRAA, ANIONGAP in the last 168 hours.  No results for input(s): PROT, ALBUMIN, AST, ALT, ALKPHOS,  BILITOT in the last 168 hours. Hematology Recent Labs  Lab 04/20/18 1533  WBC 14.9*  RBC 3.69*  HGB 11.2*  HCT 37.8*  MCV 102.4*  MCH 30.4  MCHC 29.6*  RDW 12.8  PLT 282   Cardiac EnzymesNo results for input(s): TROPONINI in the last 168 hours. No results for input(s): TROPIPOC in the last 168 hours.  BNPNo results for input(s): BNP, PROBNP in the last 168 hours.  DDimer No results for input(s): DDIMER in the last 168 hours.  Radiology/Studies:  Dg Chest Port 1 View  Result Date: 04/20/2018 CLINICAL DATA:  STEMI EXAM: PORTABLE CHEST 1 VIEW COMPARISON:  08/07/2016 FINDINGS: Cardiac shadows within normal limits but accentuated by the portable technique. The overall inspiratory effort is poor. Chronic blunting of left costophrenic angle is noted. No focal infiltrate or sizable effusion is seen. No bony abnormality is noted. IMPRESSION: Chronic changes in the left base.  No acute abnormality noted. Electronically Signed   By: Inez Catalina M.D.   On: 04/20/2018 15:51    Assessment and Plan:   Principal Problem:   Acute ST elevation myocardial infarction (STEMI) of inferior wall (HCC) Active Problems:   Symptomatic bradycardia  Plan: Patient be taken directly to cardiac catheterization lab for urgent catheterization.  Further plans based on results.  Home medications not yet available for review.  Further plans based on review of medications and further investigation of past medical history.  Severity of Illness: The appropriate patient status for this patient is INPATIENT. Inpatient status is judged to be reasonable and necessary in order to provide the required intensity of service to ensure the patient's safety. The patient's presenting symptoms, physical exam findings, and initial radiographic and laboratory data in the context of their chronic comorbidities is felt to place them at high risk for further clinical deterioration. Furthermore, it is not anticipated that the patient  will be medically stable for discharge from the hospital within 2 midnights of admission. The following factors support the patient status of inpatient.   " The patient's presenting symptoms include dizziness, weakness. " The worrisome physical exam findings include coarse breath sounds and bradycardia. " The initial radiographic and laboratory data are worrisome because of inferior ST elevations. " The chronic co-morbidities include likely hypertension.   * I certify that at the point of admission it is my clinical judgment that the patient will require inpatient hospital care spanning beyond 2 midnights from the point of admission due to high intensity of service, high risk for further deterioration and high frequency of surveillance required.*    For questions or updates, please contact [place] Please consult [email or web address] for contact info under        Signed, Glenetta Hew, MD  04/20/2018 4:00 PM

## 2018-04-20 NOTE — ED Notes (Signed)
[facility] contacted to activate [facility] St[facility]

## 2018-04-20 NOTE — ED Provider Notes (Signed)
[facility] EMERGENCY DEPARTMENT Provider Note   CSN: [id number] Arrival date & time: 04/20/18  1512     History   Chief Complaint Chief Complaint  Patient presents with  . Code STEMI    HPI Clayton [name] is a 84 y.o. male.  HPI  Patient is a 84 year old male with a past medical history of renal insufficiency, anemia, and diabetes mellitus presents to the emergency department as a transfer from urgent care for a possible pneumonia and found to have inferior ST elevations while in route with EMS.  As a result patient was made a code STEMI prior to arrival to the emergency department.  Patient reports that at approximately 5 AM this morning he noticed that he was feeling a little bit dizzy and lightheaded, but denies any chest pain.  Just states that he has not been feeling well throughout the day.  As a result patient went to urgent care where he told the provider he felt somewhat short of breath.  As a result an x-ray was performed which reportedly showed evidence of a right-sided pneumonia.  She was then sent to the emergency department for further evaluation.  While in route patient had findings on his EKG that was concerning for inferior STEMI with ST elevations in leads II, III and aVF.  There was also some reciprocal depression and aVL.  En route to the emergency department patient remains in no acute distress.  He denies any chest pain at this time.  He continues to endorse mild shortness of breath and dizziness earlier this morning, but feels well at this time.  Remaining review of systems as below.  Past Medical History:  Diagnosis Date  . Anemia   . Chronic renal insufficiency   . DDD (degenerative disc disease), lumbar   . Diabetes mellitus   . Hypertension   . Neuromuscular disorder ([place])   . Osteoporosis     Patient Active Problem List   Diagnosis Date Noted  . AV heart block   . Coronary artery disease   . Anemia   . Chronic obstructive pulmonary  disease ([place])   . Acute ST elevation myocardial infarction (STEMI) of inferior wall ([place]) 04/20/2018  . Symptomatic bradycardia 04/20/2018  . Iron deficiency 10/30/2016  . Cellulitis of right hand 08/11/2016  . Type 2 diabetes mellitus with hyperglycemia, with long-term current use of insulin ([place]) 08/07/2016  . Chronic kidney disease, stage IV (severe) ([place]) 08/07/2016  . Type II or unspecified type diabetes mellitus with renal manifestations, uncontrolled(250.42) 02/03/2014  . Type II or unspecified type diabetes mellitus without mention of complication, uncontrolled 07/03/2013  . BPH (benign prostatic hyperplasia) 07/03/2013  . Pure hypercholesterolemia 04/06/2013  . Dyspnea 03/04/2013  . Type II or unspecified type diabetes mellitus without mention of complication, not stated as uncontrolled 01/01/2013  . Smoking history 07/24/2012  . Cough variant asthma 07/24/2012  . Solitary pulmonary nodule 06/27/2012  . Hypoxia 06/19/2012  . Diabetes mellitus ([place]) 06/19/2012  . CKD (chronic kidney disease) stage 3, GFR 30-59 ml/min ([facility]) 06/19/2012  . Hard of hearing 06/19/2012  . Essential hypertension 05/19/2010  . OSA treated with BiPAP 05/19/2010    Past Surgical History:  Procedure Laterality Date  . CORONARY STENT INTERVENTION N/A 04/20/2018   Procedure: CORONARY STENT INTERVENTION;  Surgeon: Leonie Man, MD;  Location: [facility];  Service: Cardiovascular;  Laterality: N/A;  . CORONARY/GRAFT ACUTE MI REVASCULARIZATION N/A 04/20/2018   Procedure: Coronary/Graft Acute MI Revascularization;  Surgeon: Leonie Man, MD;  Location: [facility];  Service: Cardiovascular;  Laterality: N/A;  . EYE SURGERY    . I&D EXTREMITY Right 08/13/2016   Procedure: IRRIGATION AND DEBRIDEMENT RIGHT ELBOW AND HAND;  Surgeon: Milly Jakob, MD;  Location: [facility];  Service: Orthopedics;  Laterality: Right;  . LEFT HEART CATH AND CORONARY ANGIOGRAPHY N/A 04/20/2018   Procedure: LEFT  HEART CATH AND CORONARY ANGIOGRAPHY;  Surgeon: Leonie Man, MD;  Location: [facility];  Service: Cardiovascular;  Laterality: N/A;  . VASECTOMY    . VIDEO BRONCHOSCOPY Bilateral 08/25/2012   Procedure: VIDEO BRONCHOSCOPY WITHOUT FLUORO;  Surgeon: Brand Males, MD;  Location: [facility];  Service: Cardiopulmonary;  Laterality: Bilateral;        Home Medications    Prior to Admission medications   Medication Sig Start Date End Date Taking? Authorizing Provider  albuterol (PROVENTIL HFA;VENTOLIN HFA) 108 (90 Base) MCG/ACT inhaler Inhale 1-2 puffs into the lungs every 6 (six) hours as needed for wheezing or shortness of breath.   Yes [provider]  budesonide-formoterol (SYMBICORT) 160-4.5 MCG/ACT inhaler Inhale 2 puffs into the lungs 2 (two) times daily. 07/29/13  Yes Tanda Rockers, MD  cholecalciferol (VITAMIN D) 1000 units tablet Take 2,000 Units by mouth daily.   Yes [provider]  ferrous sulfate 325 (65 FE) MG tablet Take 325 mg by mouth daily with breakfast.   Yes [provider]  finasteride (PROSCAR) 5 MG tablet TAKE 1 TABLET (5 MG TOTAL) BY MOUTH DAILY. Patient taking differently: Take 5 mg by mouth daily.  02/19/18  Yes Elayne Snare, MD  furosemide (LASIX) 20 MG tablet Take 1 tablet (20 mg total) by mouth daily. Take 2 tabs 5 times weekly. 10/31/17  Yes Elayne Snare, MD  insulin aspart (NOVOLOG) 100 UNIT/ML injection TAKE 8 UNITS UNDER THE SKIN DAILY Monday-Friday AND ON Saturday AND Sunday TAKE 6 UNITS AT BREAKFAST AND LUNCH, AND 8 UNITS AT DINNER. 12/04/17  Yes Elayne Snare, MD  insulin glargine (LANTUS) 100 UNIT/ML injection INJECT 24 UNITS SUBCUTANEOUSLY IN THE MORNING AND 20 UNITS IN THE EVENING Patient taking differently: Inject 24 Units into the skin every morning.  09/18/17  Yes Elayne Snare, MD  Multiple Vitamin (MULTIVITAMIN WITH MINERALS) TABS tablet Take 1 tablet by mouth daily.   Yes [provider]  omeprazole (PRILOSEC) 20  MG capsule Take 20 mg by mouth 2 (two) times daily before a meal.   Yes [provider]  simvastatin (ZOCOR) 20 MG tablet TAKE 1 TABLET EVERY DAY 03/31/18  Yes Elayne Snare, MD  ACCU-CHEK SOFTCLIX LANCETS lancets TEST AS INSTRUCTED TO OBTAIN A BLOOD SPECIMEN 4 TIMES DAILY 02/26/18   Elayne Snare, MD  Alcohol Swabs (B-D SINGLE USE SWABS REGULAR) PADS USE 7 SWABS DAILY AS DIRECTED 02/18/18   Elayne Snare, MD  glucose blood (ACCU-CHEK AVIVA PLUS) test strip 1 each by Other route daily. Use as instructed to check blood sugar 4 times daily. 10/03/17   Elayne Snare, MD  Insulin Syringe-Needle U-100 (BD INSULIN SYRINGE U/F) 31G X 5/16" 0.3 ML MISC USE THREE TIMES DAILY 02/12/18   Elayne Snare, MD    Family History Family History  Problem Relation Age of Onset  . Diabetes Mellitus II Unknown   . Hypertension Unknown     Social History Social History   Tobacco Use  . Smoking status: Former Smoker    Packs/day: 3.00    Years: 33.00    Pack years:  99.00    Types: Cigarettes    Last attempt to quit: 06/19/1973    Years since quitting: 44.8  . Smokeless tobacco: Former Systems developer    Quit date: 09/26/1975  Substance Use Topics  . Alcohol use: No  . Drug use: No     Allergies   Doxycycline; Levaquin [levofloxacin in d5w]; Nitrous oxide; Penicillins; Tape; and Diltiazem   Review of Systems Review of Systems  Constitutional: Negative for chills and fever.  HENT: Negative for ear pain and sore throat.   Eyes: Negative for pain and visual disturbance.  Respiratory: Positive for shortness of breath. Negative for cough.   Cardiovascular: Negative for chest pain and palpitations.  Gastrointestinal: Negative for abdominal pain and vomiting.  Genitourinary: Negative for dysuria and hematuria.  Musculoskeletal: Negative for arthralgias and back pain.  Skin: Negative for color change and rash.  Neurological: Positive for light-headedness. Negative for seizures and syncope.  All other systems reviewed  and are negative.    Physical Exam Updated Vital Signs BP (!) 111/43 (BP Location: Right Arm)   Pulse 71   Temp 98 F (36.7 C) (Oral)   Resp 19   Ht 5\' 10"  (1.778 m)   Wt 88.5 kg   SpO2 100%   BMI 27.98 kg/m   Physical Exam  Constitutional: He appears well-developed. No distress.  HENT:  Head: Normocephalic and atraumatic.  Eyes: Conjunctivae are normal.  Neck: Neck supple.  Cardiovascular: Normal rate and regular rhythm.  No murmur heard. Pulmonary/Chest: Effort normal. No respiratory distress.  Diminished breath sounds bilaterally  Abdominal: Soft. He exhibits no distension. There is no tenderness.  Musculoskeletal: He exhibits no edema.  Neurological: He is alert.  Skin: Skin is warm and dry. He is not diaphoretic.  Psychiatric: He has a normal mood and affect.  Nursing note and vitals reviewed.    ED Treatments / Results  Labs (all labs ordered are listed, but only abnormal results are displayed) Labs Reviewed  CBC WITH DIFFERENTIAL/PLATELET - Abnormal; Notable for the following components:      Result Value   WBC 14.9 (*)    RBC 3.69 (*)    Hemoglobin 11.2 (*)    HCT 37.8 (*)    MCV 102.4 (*)    MCHC 29.6 (*)    Neutro Abs 12.4 (*)    Abs Immature Granulocytes 0.08 (*)    All other components within normal limits  COMPREHENSIVE METABOLIC PANEL - Abnormal; Notable for the following components:   Glucose, Bld 201 (*)    BUN 51 (*)    Creatinine, Ser 2.63 (*)    GFR calc non Af Amer 20 (*)    GFR calc Af Amer 23 (*)    All other components within normal limits  TROPONIN I - Abnormal; Notable for the following components:   Troponin I 1.04 (*)    All other components within normal limits  LIPID PANEL - Abnormal; Notable for the following components:   HDL 37 (*)    All other components within normal limits  HEMOGLOBIN A1C - Abnormal; Notable for the following components:   Hgb A1c MFr Bld 6.3 (*)    All other components within normal limits    TROPONIN I - Abnormal; Notable for the following components:   Troponin I 11.34 (*)    All other components within normal limits  TROPONIN I - Abnormal; Notable for the following components:   Troponin I 46.23 (*)    All other components within normal  limits  TROPONIN I - Abnormal; Notable for the following components:   Troponin I 62.25 (*)    All other components within normal limits  BASIC METABOLIC PANEL - Abnormal; Notable for the following components:   Sodium 133 (*)    CO2 19 (*)    Glucose, Bld 334 (*)    BUN 57 (*)    Creatinine, Ser 2.66 (*)    Calcium 8.5 (*)    GFR calc non Af Amer 20 (*)    GFR calc Af Amer 23 (*)    All other components within normal limits  CBC - Abnormal; Notable for the following components:   WBC 13.9 (*)    RBC 2.92 (*)    Hemoglobin 8.9 (*)    HCT 28.9 (*)    All other components within normal limits  GLUCOSE, CAPILLARY - Abnormal; Notable for the following components:   Glucose-Capillary 275 (*)    All other components within normal limits  GLUCOSE, CAPILLARY - Abnormal; Notable for the following components:   Glucose-Capillary 359 (*)    All other components within normal limits  GLUCOSE, CAPILLARY - Abnormal; Notable for the following components:   Glucose-Capillary 313 (*)    All other components within normal limits  MRSA PCR SCREENING  PROTIME-INR  APTT  SAMPLE TO BLOOD BANK    EKG EKG Interpretation  Date/Time:  Sunday April 20 2018 15:19:12 EST Ventricular Rate:  51 PR Interval:    QRS Duration: 98 QT Interval:  489 QTC Calculation: 451 R Axis:   61 Text Interpretation:  Sinus rhythm Prolonged PR interval Inferior infarct, acute (RCA) Probable RV involvement, suggest recording right precordial leads Baseline wander in lead(s) V3 >>> Acute MI <<< ** ** ACUTE MI / STEMI ** ** Confirmed by Nanda Quinton [phone]) on 04/20/2018 3:41:45 PM   Radiology Dg Chest Port 1 View  Result Date: 04/20/2018 CLINICAL DATA:  STEMI  EXAM: PORTABLE CHEST 1 VIEW COMPARISON:  08/07/2016 FINDINGS: Cardiac shadows within normal limits but accentuated by the portable technique. The overall inspiratory effort is poor. Chronic blunting of left costophrenic angle is noted. No focal infiltrate or sizable effusion is seen. No bony abnormality is noted. IMPRESSION: Chronic changes in the left base.  No acute abnormality noted. Electronically Signed   By: Inez Catalina M.D.   On: 04/20/2018 15:51    Procedures Procedures (including critical care time)  Medications Ordered in ED Medications  0.9 %  sodium chloride infusion ( Intravenous New Bag/Given 04/20/18 1541)  albuterol (PROVENTIL) (2.5 MG/3ML) 0.083% nebulizer solution 2.5 mg (has no administration in time range)  mometasone-formoterol (DULERA) 200-5 MCG/ACT inhaler 2 puff (2 puffs Inhalation Given 04/21/18 0736)  famotidine (PEPCID) tablet 20 mg (20 mg Oral Given 04/20/18 2202)  ferrous sulfate tablet 325 mg (325 mg Oral Given 04/21/18 0947)  montelukast (SINGULAIR) tablet 10 mg (10 mg Oral Given 04/20/18 2202)  Melatonin TABS 3 mg (3 mg Oral Given 04/20/18 2211)  atorvastatin (LIPITOR) tablet 80 mg (has no administration in time range)  acetaminophen (TYLENOL) tablet 650 mg (has no administration in time range)  ondansetron (ZOFRAN) injection 4 mg (has no administration in time range)  0.9 %  sodium chloride infusion ( Intravenous Stopped 04/21/18 0155)  sodium chloride flush (NS) 0.9 % injection 3 mL (3 mLs Intravenous Given 04/21/18 0948)  sodium chloride flush (NS) 0.9 % injection 3 mL (has no administration in time range)  0.9 %  sodium chloride infusion ( Intravenous Stopped 04/21/18 0203)  aspirin chewable tablet 81 mg (81 mg Oral Given 04/21/18 0948)  ticagrelor (BRILINTA) tablet 90 mg (90 mg Oral Given 04/21/18 0517)  norepinephrine (LEVOPHED) 4mg  in D5W 217mL premix infusion (3 mcg/min Intravenous Rate/Dose Verify 04/21/18 0900)  insulin aspart (novoLOG) injection 0-15 Units (11  Units Subcutaneous Given 04/21/18 0744)  insulin aspart (novoLOG) injection 0-5 Units (5 Units Subcutaneous Given 04/20/18 2211)  insulin glargine (LANTUS) injection 25 Units (25 Units Subcutaneous Given 04/21/18 0951)  aspirin chewable tablet 324 mg (324 mg Oral Given 04/20/18 1540)  heparin injection 60 Units/kg (4,000 Units Intravenous Given 04/20/18 1540)  norepinephrine (LEVOPHED) 4 mg in dextrose 5 % 250 mL (0.016 mg/mL) infusion (5 mcg/kg/min  88.5 kg Intravenous Rate/Dose Change 04/20/18 1710)  cangrelor (KENGREAL) 50,000 mcg in sodium chloride 0.9 % 250 mL (200 mcg/mL) infusion (4 mcg/kg/min  88.5 kg Intravenous New Bag/Given 04/20/18 1638)  0.9 %  sodium chloride infusion (10 mL/hr Intravenous New Bag/Given 04/20/18 1643)     Initial Impression / Assessment and Plan / ED Course  I have reviewed the triage vital signs and the nursing notes.  Pertinent labs & imaging results that were available during my care of the patient were reviewed by me and considered in my medical decision making (see chart for details).     Patient is an 84 year old male with past medical history as detailed above who presents to the emergency department for evaluation of a reported pneumonia and was found to have ST elevation in the inferior leads while being transported by EMS.  As a result patient was made a code STEMI while in route to the emergency department.  He was given aspirin upon arrival to the emergency department and cardiology was immediately consulted.  Patient was evaluated by cardiology at the bedside immediately upon arrival to the emergency department.  After reviewing patient's EKG and history they made the decision to take the patient to the cardiac catheterization lab for further evaluation.  For further information regarding patient's continued hospital course following cardiac catheterization please see the admitting team's documentation.  The care of this patient was discussed with my  attending physician Dr. Laverta Baltimore, who voices agreement with work-up and ED disposition.  Final Clinical Impressions(s) / ED Diagnoses   Final diagnoses:  ST elevation myocardial infarction (STEMI), unspecified artery [facility])    ED Discharge Orders    None       Jarmon Javid, Chanda Busing, MD 04/21/18 1044    Long, Wonda Olds, MD 04/21/18 1731

## 2018-04-20 NOTE — Progress Notes (Signed)
CRITICAL VALUE ALERT  Critical Value: Troponin 11.34  Date & Time Notied: 2030  Provider Notified:   Orders Received/Actions taken: none, expected value

## 2018-04-20 NOTE — Brief Op Note (Signed)
04/20/2018  5:30 PM  PATIENT:  Clayton [name]  84 y.o. male with history of insulin dependent diabetes who presented to [facility] emergency room in transfer from urgent care with a diagnosis of possible pneumonia.  Unfortunately in route to the hospital he was found to have inferior ST elevations with sinus bradycardia possible to 1 AV block.  The patient's only symptom were feeling fatigue and dizziness.  He had no chest pain. Because of the EKG changes he was taken directly to cardiac catheterization lab for cardiac catheterization and possible PCI.  Was given 4000 units IV heparin and 324 mg aspirin.  Consent obtained in the ER.  PRE-OPERATIVE DIAGNOSIS: Inferior STEMI; systematic bradycardia (cannot exclude 2 1 AV block).  POST-OPERATIVE DIAGNOSIS:   Severe multivessel disease with 100% proximal RCA occlusion with 90% ostial circumflex (90% ostial OM1) and 60% mid LAD.  I discussed findings with Dr. Cyndia Bent on telephone with concern for possible need for CABG --> with renal insufficiency and diabetes and multivessel disease, he felt that the patient would not be a good candidate to take to the OR in the urgent situation.  He recommended PCI as best possible.  Mild Cardiogenic Shock -initially requiring low-dose levo fed, but during the procedure his pressures did go down.  But now stabilized on very low-dose Levophed --->  being used as much for rate control as blood pressure.  Successful DES PCI of a long lesion in the dominant RCA (synergy 3.0 mm 38 mm postdilated to 3.5-3.6 mm)  Temporary wire placement for symptom medic bradycardia cannot exclude 2: 1 AV block.  PROCEDURE:  Procedure(s): Coronary/Graft Acute MI Revascularization (N/A) LEFT HEART CATH AND CORONARY ANGIOGRAPHY (N/A) Temporary Pacemaker Insertion CORONARY STENT INTERVENTION (N/A)  Left radial access: Seldinger technique, micropuncture kit.  6 French sheath Left heart catheterization/coronary angiography:   5  [place] JR4 followed by JL4 catheters for right and left coronary angiography  Angled pigtail catheter for LV hemodynamics.  No LV gram due to elevated creatinine.  RCA PCI: 6 [place] JR4 guide: Prowater wire, 2.5 mm x 12 mm predilation balloon several inflations.  Synergy DES 3.0 mm x 38 mm postdilated to with 3.0 mm x 18 mm post dilation balloon. -3.6-3.3 mm post dilation.  Initial slow reflow resolved with 200 mcg IC nitroglycerin  Temporary pacemaker: 6 French right femoral venous sheath, Seldinger technique.  5 French temporary wire advanced under fluoroscopy into the RV.  Threshold 0.69m --> set at 45 bpm,4 mA output  SURGEON:  Surgeon(s) and Role:    * HLeonie Man MD - Primary  PHYSICIAN ASSISTANT:   ASSISTANTS: none   ANESTHESIA:   local; no sedation  EBL: Minimal  (<50 mL)  Radial sheath removed: TR band placed 14 mL air, 7030 hours --RFV venous sheath sutured in place  MEDICATIONS USED: Total contrast 105 mL; 20 mcg IC nitroglycerin; Kengreal bolus and infusion followed by 180 mg Brilinta (Kengreal used due to concern for possibly needing to go to the OR).  IV heparin total 8000 units  DICTATION: .[place] to [place] -Continue temporary wire overnight to ensure stable heart rhythm.  Continue IV Levophed infusion to maintain systolic blood pressures greater than 95 and map greater than 65.  Restart IV heparin 8 hours after the radial sheath removal  Convert from simvastatin to high-dose atorvastatin  Restart Lantus with sliding scale insulin  For now we will continue aspirin plus Brilinta. -Would plan for minimum  1 year, but okay to stop aspirin after 6 months.  Need to review Cath Lab images of the ostial circumflex flex lesion with interventional colleagues to determine if there is any PCI options.  Recommendation at this time would be to treat medically and reassess for symptoms.  If symptomatic 1 to 2 months post MI,  would consider the possibility of high risk PCI versus CABG.  PATIENT DISPOSITION:  ICU - extubated and stable.     Glenetta Hew, M.D., M.S. Interventional Cardiologist   Pager # [phone] Phone # [phone] [address] [place], [place] [address]

## 2018-04-21 ENCOUNTER — Encounter (HOSPITAL_COMMUNITY): Payer: Self-pay | Admitting: Cardiology

## 2018-04-21 ENCOUNTER — Inpatient Hospital Stay (HOSPITAL_COMMUNITY): Payer: Medicare PPO

## 2018-04-21 DIAGNOSIS — E1122 Type 2 diabetes mellitus with diabetic chronic kidney disease: Secondary | ICD-10-CM

## 2018-04-21 DIAGNOSIS — I2510 Atherosclerotic heart disease of native coronary artery without angina pectoris: Secondary | ICD-10-CM

## 2018-04-21 DIAGNOSIS — N184 Chronic kidney disease, stage 4 (severe): Secondary | ICD-10-CM

## 2018-04-21 DIAGNOSIS — I25118 Atherosclerotic heart disease of native coronary artery with other forms of angina pectoris: Secondary | ICD-10-CM

## 2018-04-21 DIAGNOSIS — I25119 Atherosclerotic heart disease of native coronary artery with unspecified angina pectoris: Secondary | ICD-10-CM

## 2018-04-21 DIAGNOSIS — D649 Anemia, unspecified: Secondary | ICD-10-CM

## 2018-04-21 DIAGNOSIS — J449 Chronic obstructive pulmonary disease, unspecified: Secondary | ICD-10-CM

## 2018-04-21 DIAGNOSIS — I213 ST elevation (STEMI) myocardial infarction of unspecified site: Secondary | ICD-10-CM

## 2018-04-21 DIAGNOSIS — I4430 Unspecified atrioventricular block: Secondary | ICD-10-CM

## 2018-04-21 DIAGNOSIS — Z794 Long term (current) use of insulin: Secondary | ICD-10-CM

## 2018-04-21 DIAGNOSIS — G4733 Obstructive sleep apnea (adult) (pediatric): Secondary | ICD-10-CM

## 2018-04-21 HISTORY — PX: TRANSTHORACIC ECHOCARDIOGRAM: SHX275

## 2018-04-21 LAB — HEMOGLOBIN A1C
Hgb A1c MFr Bld: 6.3 % — ABNORMAL HIGH (ref 4.8–5.6)
Mean Plasma Glucose: 134.11 mg/dL

## 2018-04-21 LAB — GLUCOSE, CAPILLARY
Glucose-Capillary: 313 mg/dL — ABNORMAL HIGH (ref 70–99)
Glucose-Capillary: 270 mg/dL — ABNORMAL HIGH (ref 70–99)
Glucose-Capillary: 231 mg/dL — ABNORMAL HIGH (ref 70–99)
Glucose-Capillary: 203 mg/dL — ABNORMAL HIGH (ref 70–99)

## 2018-04-21 LAB — BASIC METABOLIC PANEL
Anion gap: 8 (ref 5–15)
BUN: 57 mg/dL — ABNORMAL HIGH (ref 8–23)
CO2: 19 mmol/L — AB (ref 22–32)
CREATININE: 2.66 mg/dL — AB (ref 0.61–1.24)
Calcium: 8.5 mg/dL — ABNORMAL LOW (ref 8.9–10.3)
Chloride: 106 mmol/L (ref 98–111)
GFR calc non Af Amer: 20 mL/min — ABNORMAL LOW (ref >60)
GFR, EST AFRICAN AMERICAN: 23 mL/min — AB (ref >60)
Glucose, Bld: 334 mg/dL — ABNORMAL HIGH (ref 70–99)
POTASSIUM: 4.3 mmol/L (ref 3.5–5.1)
SODIUM: 133 mmol/L — AB (ref 135–145)

## 2018-04-21 LAB — POCT I-STAT, CHEM 8
BUN: 48 mg/dL — AB (ref 8–23)
CALCIUM ION: 1.18 mmol/L (ref 1.15–1.40)
CHLORIDE: 109 mmol/L (ref 98–111)
Creatinine, Ser: 2.7 mg/dL — ABNORMAL HIGH (ref 0.61–1.24)
GLUCOSE: 231 mg/dL — AB (ref 70–99)
HCT: 31 % — ABNORMAL LOW (ref 39.0–52.0)
Hemoglobin: 10.5 g/dL — ABNORMAL LOW (ref 13.0–17.0)
Potassium: 3.8 mmol/L (ref 3.5–5.1)
Sodium: 140 mmol/L (ref 135–145)
TCO2: 20 mmol/L — ABNORMAL LOW (ref 22–32)

## 2018-04-21 LAB — CBC
HCT: 28.9 % — ABNORMAL LOW (ref 39.0–52.0)
HEMOGLOBIN: 8.9 g/dL — AB (ref 13.0–17.0)
MCH: 30.5 pg (ref 26.0–34.0)
MCHC: 30.8 g/dL (ref 30.0–36.0)
MCV: 99 fL (ref 80.0–100.0)
NRBC: 0 % (ref 0.0–0.2)
Platelets: 264 10*3/uL (ref 150–400)
RBC: 2.92 MIL/uL — AB (ref 4.22–5.81)
RDW: 12.9 % (ref 11.5–15.5)
WBC: 13.9 10*3/uL — AB (ref 4.0–10.5)

## 2018-04-21 LAB — POCT ACTIVATED CLOTTING TIME
ACTIVATED CLOTTING TIME: 224 s
Activated Clotting Time: 367 seconds

## 2018-04-21 LAB — ECHOCARDIOGRAM COMPLETE
Height: 70 in
WEIGHTICAEL: 3120 [oz_av]

## 2018-04-21 LAB — TROPONIN I
Troponin I: 46.23 ng/mL (ref <0.03)
Troponin I: 62.25 ng/mL (ref <0.03)

## 2018-04-21 MED ORDER — INSULIN GLARGINE 100 UNIT/ML ~~LOC~~ SOLN
25.0000 [IU] | Freq: Every day | SUBCUTANEOUS | Status: DC
  Administered 2018-04-21 – 2018-04-26 (×6): 25 [IU] via SUBCUTANEOUS
  Filled 2018-04-21 (×6): qty 0.25

## 2018-04-21 NOTE — Progress Notes (Signed)
..  EKG CRITICAL VALUE     12 lead EKG performed.  Critical value noted.Terrilyn Saver, RN notified.   Ihor Gully, CCT 04/21/2018 7:50 AM

## 2018-04-21 NOTE — Progress Notes (Addendum)
Progress Note  Patient Name: Clayton [name] Date of Encounter: 04/21/2018  Primary Cardiologist: nw, Dr. Ellyn Hack  Subjective   No chest pain or dyspnea  Inpatient Medications    Scheduled Meds: . aspirin  81 mg Oral Daily  . atorvastatin  80 mg Oral q1800  . famotidine  20 mg Oral QHS  . ferrous sulfate  325 mg Oral Q breakfast  . insulin aspart  0-15 Units Subcutaneous TID WC  . insulin aspart  0-5 Units Subcutaneous QHS  . insulin glargine  20 Units Subcutaneous Daily  . Melatonin  3 mg Oral QHS  . mometasone-formoterol  2 puff Inhalation BID  . montelukast  10 mg Oral QHS  . sodium chloride flush  3 mL Intravenous Q12H  . ticagrelor  90 mg Oral BID   Continuous Infusions: . sodium chloride 10 mL/hr at 04/20/18 1541  . sodium chloride 250 mL (04/21/18 0200)  . norepinephrine (LEVOPHED) Adult infusion 5 mcg/min (04/21/18 0149)   PRN Meds: sodium chloride, acetaminophen, albuterol, ondansetron (ZOFRAN) IV, sodium chloride flush   Vital Signs    Vitals:   04/21/18 0615 04/21/18 0630 04/21/18 0645 04/21/18 0738  BP: (!) 118/47 125/63 (!) 117/49 (!) 115/40  Pulse:      Resp: 18 17 18 11   Temp:    98 F (36.7 C)  TempSrc:    Oral  SpO2: 100% 100% 100% 100%  Weight:      Height:        Intake/Output Summary (Last 24 hours) at 04/21/2018 0801 Last data filed at 04/21/2018 0600 Gross per 24 hour  Intake 1272.25 ml  Output 500 ml  Net 772.25 ml    I/O since admission: +906  Filed Weights   04/20/18 1536  Weight: 88.5 kg    Telemetry    Intermittent 2:1 block, junctional, occassionally V paved; rate in the low 40s - Personally Reviewed  ECG    04/21/2018 ECG (independently read by me): 2:1 AV block with intermittent paced rhythm with Q II, aVF with 1 mm residual STE  04/20/2018 ECG (independently read by me): 2:1 AV block with STE inferolaterally  Physical Exam    BP (!) 115/40   Pulse 71   Temp 98 F (36.7 C) (Oral)   Resp 11   Ht 5\' 10"   (1.778 m)   Wt 88.5 kg   SpO2 100%   BMI 27.98 kg/m  General: Alert, oriented, no distress.  Skin: normal turgor, no rashes, warm and dry HEENT: Normocephalic, atraumatic. Pupils equal round and reactive to light; sclera anicteric; extraocular muscles intact; Nose without nasal septal hypertrophy Mouth/Parynx benign;  Neck: No JVD, no carotid bruits; normal carotid upstroke Lungs: occasional rhochi Chest wall: without tenderness to palpitation Heart: PMI not displaced, RRR, s1 s2 normal, 1/6 systolic murmur, no diastolic murmur, no rubs, gallops, thrills, or heaves Abdomen: soft, nontender; no hepatosplenomehaly, BS+; abdominal aorta nontender and not dilated by palpation. Back: no CVA tenderness Pulses 2+ L radial site stable; R groin temporary pacemaker  Musculoskeletal: full range of motion,  Extremities: no clubbing cyanosis or edema, Homan's sign negative  Neurologic: grossly nonfocal; Cranial nerves grossly wnl Psychologic: Normal mood and affect   Labs    Chemistry Recent Labs  Lab 04/20/18 1533 04/21/18 0557  NA 141 133*  K 3.6 4.3  CL 107 106  CO2 23 19*  GLUCOSE 201* 334*  BUN 51* 57*  CREATININE 2.63* 2.66*  CALCIUM 9.4 8.5*  PROT 6.9  --  ALBUMIN 3.8  --   AST 39  --   ALT 15  --   ALKPHOS 60  --   BILITOT 0.6  --   GFRNONAA 20* 20*  GFRAA 23* 23*  ANIONGAP 11 8     Hematology Recent Labs  Lab 04/20/18 1533 04/21/18 0557  WBC 14.9* 13.9*  RBC 3.69* 2.92*  HGB 11.2* 8.9*  HCT 37.8* 28.9*  MCV 102.4* 99.0  MCH 30.4 30.5  MCHC 29.6* 30.8  RDW 12.8 12.9  PLT 282 264    Cardiac Enzymes Recent Labs  Lab 04/20/18 1533 04/20/18 1832 04/21/18 0030 04/21/18 0557  TROPONINI 1.04* 11.34* 46.23* 62.25*   No results for input(s): TROPIPOC in the last 168 hours.   BNPNo results for input(s): BNP, PROBNP in the last 168 hours.   DDimer No results for input(s): DDIMER in the last 168 hours.   Lipid Panel     Component Value Date/Time    CHOL 121 04/20/2018 1533   TRIG 111 04/20/2018 1533   HDL 37 (L) 04/20/2018 1533   CHOLHDL 3.3 04/20/2018 1533   VLDL 22 04/20/2018 1533   LDLCALC 62 04/20/2018 1533     Radiology    Dg Chest Port 1 View  Result Date: 04/20/2018 CLINICAL DATA:  STEMI EXAM: PORTABLE CHEST 1 VIEW COMPARISON:  08/07/2016 FINDINGS: Cardiac shadows within normal limits but accentuated by the portable technique. The overall inspiratory effort is poor. Chronic blunting of left costophrenic angle is noted. No focal infiltrate or sizable effusion is seen. No bony abnormality is noted. IMPRESSION: Chronic changes in the left base.  No acute abnormality noted. Electronically Signed   By: Inez Catalina M.D.   On: 04/20/2018 15:51    Cardiac Studies     CULPRIT LESION: mid RCA-1 lesion is 100% stenosed. (Prox RCA lesion is 55% stenosed tapered to occlusion, Mid RCA-2 lesion is 80% stenosed distal to occlusion)  A drug-eluting stent was successfully placed covering the upstream tapered segment and the downstream lesion, using a STENT SYNERGY DES 3X38. Stent postdilated to 3.6 mm  Post intervention, there is a 0% residual stenosis.  -----------RESIDUAL DISEASE---------------------------------  Mid LM to Ost LAD lesion is 40% stenosed with 85% stenosed side branch in Ost Cx.  Ost Cx to Prox Cx lesion is 65% stenosed with 85% stenosed side branch in Ost 1st Mrg.  Prox LAD-1 lesion is 60% stenosed -just beyond 1st Diag, followed by Prox LAD-2 lesion is 50% stenosed.  Dist LAD lesion is 75% stenosed.  Ost 1st Diag-1 lesion is 65% stenosed followed by focal 90% stenosis at takeoff of small branch.  -----------------------------------------------------------------  LV end diastolic pressure is mildly elevated.   SUMMARY  Severe multivessel disease with 100% proximal RCA occlusion with 90% ostial circumflex (90% ostial OM1) and 60% mid LAD. ? I discussed findings with Dr. Cyndia Bent on telephone with concern for  possible need for CABG-->with renal insufficiency and diabetes and multivessel disease, he felt that the patient would not be a good candidate to take to the OR in the urgent situation. He recommended PCI as best possible.  Mild Cardiogenic Shock-initially requiring low-dose levo fed, but during the procedure his pressures did go down. But now stabilized on very low-dose Levophed --->being used as much for rate control as blood pressure.  Successful DES PCI of a long lesion in the dominant RCA (synergy 3.0 mm 38 mm postdilated to 3.5-3.6 mm)  Temporary wire placement for symptom medic bradycardia cannot exclude 2: 1 AV  block.  RECOMMENDATIONS Admit to inpatient-Continue temporary wire overnight to ensure stable heart rhythm.  Continue IV Levophed infusion to maintain systolic blood pressures greater than 95 and map greater than 65.  Restart IV heparin 8 hours after the radial sheath removal  Convert from simvastatin to high-dose atorvastatin  Restart Lantus with sliding scale insulin  Recommend uninterrupted dual antiplatelet therapy with  for a minimum of .  Need to review Cath Lab images of the ostial circumflex flex lesion with interventional colleagues to determine if there is any PCI options. Recommendation at this time would be to treat medically and reassess for symptoms. If symptomatic 1 to 2 months post MI, would       Post-Intervention Diagram            Patient Profile     Clayton [name] is a 84 y.o. male with no prior cardiac history who was in his usual state of health until this morning at roughly 5 AM.  Transferred from urgent care to [facility] for possible pneumonia, found to have inferior ST elevations.   Assessment & Plan    1. Day 1 S/P Inferior STEMI: cath as above. Thrombotic occlusion of RCA  Rx with DES stent. Troponin > 65. Mild hypotension requiring levophed currently at 2 ug/k.  2. Concomitant CAD:  Angios reviewed  With  significant multivessel CAD may ultimately need elective CABG.  3. 2: 1 AV block;  Currently stil intermittently using pacemake with rate dropping to 39.  Will keep pacer in for now pending hopeful recovery of sinus rhythm  4. CKD  Cr 2.66; baseline Cr 2 ~ 2 yrs ago; GFR 20 c/w Stage 4.   5. IDDM: on insulin, BS > 300 this am   6. COPD:  7. OSA on BiPAP; will initiate BiPAP Auto nocturnaloy during hospitalization.  8. Anemia : Hb 11.2> 8.9; Hct 37.8 > 28.9.   CC time 45 minutes Signed, Troy Sine, MD, [facility] 04/21/2018, 8:01 AM

## 2018-04-21 NOTE — Progress Notes (Signed)
Placed patient on BIPAP per HS order, via FFM. Auto titrate settings. RN aware.

## 2018-04-21 NOTE — Progress Notes (Signed)
  Echocardiogram 2D Echocardiogram has been performed.  Clayton [name] 04/21/2018, 10:05 AM

## 2018-04-21 NOTE — Care Management (Signed)
#  1.  S/W  JULIE @ [facility] # [phone]  TICAGRELOR: NONE FORMULARY  BRILINTA  90 MG BID COVER- YES CO-PAY- $ 93.03 TIER- 3 DRUG PRIOR APPROVAL- NO  DEDUCTIBLE: NOT MET  / COVERAGE GAP  PREFERRED PHARMACY : YES  [facility] 90 DAY SUPPLY FOR M/O $182.78 90 DAY SUPPLY FOR  RETAIL $ 175.55

## 2018-04-21 NOTE — Care Management Note (Signed)
Case Management Note  Patient Details  Name: LESTER CRICKENBERGER MRN: [id number] Date of Birth: 21-Jan-1928  Subjective/Objective: 84yo male presented with c/o dizziness; s/p cath with stent.                 Action/Plan: CM met with patient/daughter to discuss transitional needs. Patient lived at home with his middle daughter, independent with ADLs, ambulating with a walker/cane PTA. PCP verified as: Dr. Lujean Amel; pharmacy of choice: [facility]. Brilinta benefits check complete, with est monthly cost $93.03, with patient informed and verbalizing understanding. CM provided patient with a Brilinta 30day free card and discussed [facility] service, with patient requesting his Rx be filled prior to transitioning home. Patient indicated his daughter would provide transport home and assistance as needed. CM will continue to follow.   Expected Discharge Date:                  Expected Discharge Plan:  Home/Self Care  In-House Referral:  NA  Discharge planning Services  CM Consult, Medication Assistance(Brilinta card/benefits check)  Post Acute Care Choice:  NA Choice offered to:  NA  DME Arranged:  N/A DME Agency:  NA  HH Arranged:  NA HH Agency:  NA  Status of Service:  In process, will continue to follow  If discussed at Long Length of Stay Meetings, dates discussed:    Additional Comments:  Midge Minium RN, BSN, NCM-BC, ACM-RN [phone] 04/21/2018, 2:50 PM

## 2018-04-22 ENCOUNTER — Telehealth: Payer: Self-pay | Admitting: Endocrinology

## 2018-04-22 ENCOUNTER — Inpatient Hospital Stay (HOSPITAL_COMMUNITY): Payer: Medicare PPO

## 2018-04-22 DIAGNOSIS — R0989 Other specified symptoms and signs involving the circulatory and respiratory systems: Secondary | ICD-10-CM

## 2018-04-22 LAB — BASIC METABOLIC PANEL
Anion gap: 8 (ref 5–15)
BUN: 63 mg/dL — ABNORMAL HIGH (ref 8–23)
CHLORIDE: 111 mmol/L (ref 98–111)
CO2: 19 mmol/L — ABNORMAL LOW (ref 22–32)
Calcium: 8.6 mg/dL — ABNORMAL LOW (ref 8.9–10.3)
Creatinine, Ser: 2.84 mg/dL — ABNORMAL HIGH (ref 0.61–1.24)
GFR, EST AFRICAN AMERICAN: 21 mL/min — AB (ref >60)
GFR, EST NON AFRICAN AMERICAN: 18 mL/min — AB (ref >60)
Glucose, Bld: 158 mg/dL — ABNORMAL HIGH (ref 70–99)
POTASSIUM: 3.9 mmol/L (ref 3.5–5.1)
SODIUM: 138 mmol/L (ref 135–145)

## 2018-04-22 LAB — CBC
HEMATOCRIT: 25.7 % — AB (ref 39.0–52.0)
Hemoglobin: 8.1 g/dL — ABNORMAL LOW (ref 13.0–17.0)
MCH: 30.7 pg (ref 26.0–34.0)
MCHC: 31.5 g/dL (ref 30.0–36.0)
MCV: 97.3 fL (ref 80.0–100.0)
NRBC: 0 % (ref 0.0–0.2)
Platelets: 204 10*3/uL (ref 150–400)
RBC: 2.64 MIL/uL — ABNORMAL LOW (ref 4.22–5.81)
RDW: 13.2 % (ref 11.5–15.5)
WBC: 12.8 10*3/uL — AB (ref 4.0–10.5)

## 2018-04-22 LAB — GLUCOSE, CAPILLARY
GLUCOSE-CAPILLARY: 153 mg/dL — AB (ref 70–99)
GLUCOSE-CAPILLARY: 206 mg/dL — AB (ref 70–99)
GLUCOSE-CAPILLARY: 147 mg/dL — AB (ref 70–99)
Glucose-Capillary: 227 mg/dL — ABNORMAL HIGH (ref 70–99)

## 2018-04-22 MED ORDER — HEPARIN SODIUM (PORCINE) 5000 UNIT/ML IJ SOLN
5000.0000 [IU] | Freq: Three times a day (TID) | INTRAMUSCULAR | Status: DC
  Administered 2018-04-22 – 2018-04-26 (×11): 5000 [IU] via SUBCUTANEOUS
  Filled 2018-04-22 (×11): qty 1

## 2018-04-22 MED ORDER — HEPARIN SODIUM (PORCINE) 5000 UNIT/ML IJ SOLN: 5000.0000 [IU] | Freq: Three times a day (TID) | INTRAMUSCULAR | Status: DC

## 2018-04-22 MED ORDER — SODIUM CHLORIDE 0.9 % IV SOLN: INTRAVENOUS | Status: DC

## 2018-04-22 NOTE — Progress Notes (Signed)
Placed patient on BIPAP for HS per order, via [facility] auto titrate settings.  RN aware.

## 2018-04-22 NOTE — Telephone Encounter (Signed)
Noted  

## 2018-04-22 NOTE — Telephone Encounter (Signed)
FYI

## 2018-04-22 NOTE — Telephone Encounter (Signed)
Patient is in [facility] -patient had a heart attack. If Dr. Dwyane Dee wants to visit him in [facility] he is in the [place] area.

## 2018-04-22 NOTE — Progress Notes (Addendum)
Progress Note  Patient Name: Clayton [name] Date of Encounter: 04/22/2018  Primary Cardiologist: nw, Dr. Ellyn Hack  Subjective   No recurrent chest pain.  Used BiPAP for only 3 hrs last night.  Inpatient Medications    Scheduled Meds: . aspirin  81 mg Oral Daily  . atorvastatin  80 mg Oral q1800  . famotidine  20 mg Oral QHS  . ferrous sulfate  325 mg Oral Q breakfast  . insulin aspart  0-15 Units Subcutaneous TID WC  . insulin aspart  0-5 Units Subcutaneous QHS  . insulin glargine  25 Units Subcutaneous Daily  . Melatonin  3 mg Oral QHS  . mometasone-formoterol  2 puff Inhalation BID  . montelukast  10 mg Oral QHS  . sodium chloride flush  3 mL Intravenous Q12H  . ticagrelor  90 mg Oral BID   Continuous Infusions: . sodium chloride 10 mL/hr at 04/20/18 1541  . sodium chloride Stopped (04/21/18 1520)  . norepinephrine (LEVOPHED) Adult infusion Stopped (04/21/18 1226)   PRN Meds: sodium chloride, acetaminophen, albuterol, ondansetron (ZOFRAN) IV, sodium chloride flush   Vital Signs    Vitals:   04/22/18 0500 04/22/18 0530 04/22/18 0745 04/22/18 0746  BP: (!) 107/59 (!) 119/53    Pulse:      Resp: 19 (!) 21    Temp:      TempSrc:      SpO2: 99% 98% 100% 100%  Weight:  90.2 kg    Height:        Intake/Output Summary (Last 24 hours) at 04/22/2018 0750 Last data filed at 04/22/2018 0529 Gross per 24 hour  Intake 1328.35 ml  Output 2250 ml  Net -921.65 ml    I/O since admission: -29  Filed Weights   04/20/18 1536 04/22/18 0530  Weight: 88.5 kg 90.2 kg    Telemetry    Sinus in 50 -60s- Personally Reviewed  ECG    11/52010 ECG (independently read by me): NSR at 72; Q 3, aVF  04/21/2018 ECG (independently read by me): 2:1 AV block with intermittent paced rhythm with Q II, aVF with 1 mm residual STE  04/20/2018 ECG (independently read by me): 2:1 AV block with STE inferolaterally  Physical Exam    BP (!) 116/52   Pulse (!) 55   Temp (!) 97.3 F  (36.3 C) (Oral)   Resp 16   Ht 5\' 10"  (1.778 m)   Wt 90.2 kg   SpO2 97%   BMI 28.53 kg/m  General: Alert, oriented, no distress.  Skin: normal turgor, no rashes, warm and dry HEENT: Normocephalic, atraumatic. Pupils equal round and reactive to light; sclera anicteric; extraocular muscles intact;  Nose without nasal septal hypertrophy Mouth/Parynx benign; Mallinpatti scale Neck: No JVD, no carotid bruits; normal carotid upstroke Lungs: scattered rhochi Chest wall: without tenderness to palpitation Heart: PMI not displaced, RRR, s1 s2 normal, 1/6 systolic murmur, no diastolic murmur, no rubs, gallops, thrills, or heaves Abdomen: soft, nontender; no hepatosplenomehaly, BS+; abdominal aorta nontender and not dilated by palpation. Back: no CVA tenderness Pulses 2+ R FV pacer in place; to remove this am Musculoskeletal: full range of motion, normal strength, no joint deformities Extremities: no clubbing cyanosis or edema, Homan's sign negative  Neurologic: grossly nonfocal; Cranial nerves grossly wnl Psychologic: Normal mood and affect   Labs    Chemistry Recent Labs  Lab 04/20/18 1533 04/20/18 1717 04/21/18 0557 04/22/18 0219  NA 141 140 133* 138  K 3.6 3.8 4.3 3.9  CL 107 109 106 111  CO2 23  --  19* 19*  GLUCOSE 201* 231* 334* 158*  BUN 51* 48* 57* 63*  CREATININE 2.63* 2.70* 2.66* 2.84*  CALCIUM 9.4  --  8.5* 8.6*  PROT 6.9  --   --   --   ALBUMIN 3.8  --   --   --   AST 39  --   --   --   ALT 15  --   --   --   ALKPHOS 60  --   --   --   BILITOT 0.6  --   --   --   GFRNONAA 20*  --  20* 18*  GFRAA 23*  --  23* 21*  ANIONGAP 11  --  8 8     Hematology Recent Labs  Lab 04/20/18 1533 04/20/18 1717 04/21/18 0557 04/22/18 0219  WBC 14.9*  --  13.9* 12.8*  RBC 3.69*  --  2.92* 2.64*  HGB 11.2* 10.5* 8.9* 8.1*  HCT 37.8* 31.0* 28.9* 25.7*  MCV 102.4*  --  99.0 97.3  MCH 30.4  --  30.5 30.7  MCHC 29.6*  --  30.8 31.5  RDW 12.8  --  12.9 13.2  PLT 282  --   264 204    Cardiac Enzymes Recent Labs  Lab 04/20/18 1533 04/20/18 1832 04/21/18 0030 04/21/18 0557  TROPONINI 1.04* 11.34* 46.23* 62.25*   No results for input(s): TROPIPOC in the last 168 hours.   BNPNo results for input(s): BNP, PROBNP in the last 168 hours.   DDimer No results for input(s): DDIMER in the last 168 hours.   Lipid Panel     Component Value Date/Time   CHOL 121 04/20/2018 1533   TRIG 111 04/20/2018 1533   HDL 37 (L) 04/20/2018 1533   CHOLHDL 3.3 04/20/2018 1533   VLDL 22 04/20/2018 1533   LDLCALC 62 04/20/2018 1533     Radiology    Dg Chest Port 1 View  Result Date: 04/20/2018 CLINICAL DATA:  STEMI EXAM: PORTABLE CHEST 1 VIEW COMPARISON:  08/07/2016 FINDINGS: Cardiac shadows within normal limits but accentuated by the portable technique. The overall inspiratory effort is poor. Chronic blunting of left costophrenic angle is noted. No focal infiltrate or sizable effusion is seen. No bony abnormality is noted. IMPRESSION: Chronic changes in the left base.  No acute abnormality noted. Electronically Signed   By: Inez Catalina M.D.   On: 04/20/2018 15:51    Cardiac Studies     CULPRIT LESION: mid RCA-1 lesion is 100% stenosed. (Prox RCA lesion is 55% stenosed tapered to occlusion, Mid RCA-2 lesion is 80% stenosed distal to occlusion)  A drug-eluting stent was successfully placed covering the upstream tapered segment and the downstream lesion, using a STENT SYNERGY DES 3X38. Stent postdilated to 3.6 mm  Post intervention, there is a 0% residual stenosis.  -----------RESIDUAL DISEASE---------------------------------  Mid LM to Ost LAD lesion is 40% stenosed with 85% stenosed side branch in Ost Cx.  Ost Cx to Prox Cx lesion is 65% stenosed with 85% stenosed side branch in Ost 1st Mrg.  Prox LAD-1 lesion is 60% stenosed -just beyond 1st Diag, followed by Prox LAD-2 lesion is 50% stenosed.  Dist LAD lesion is 75% stenosed.  Ost 1st Diag-1 lesion is 65%  stenosed followed by focal 90% stenosis at takeoff of small branch.  -----------------------------------------------------------------  LV end diastolic pressure is mildly elevated.   SUMMARY  Severe multivessel disease with 100% proximal  RCA occlusion with 90% ostial circumflex (90% ostial OM1) and 60% mid LAD. ? I discussed findings with Dr. Cyndia Bent on telephone with concern for possible need for CABG-->with renal insufficiency and diabetes and multivessel disease, he felt that the patient would not be a good candidate to take to the OR in the urgent situation. He recommended PCI as best possible.  Mild Cardiogenic Shock-initially requiring low-dose levo fed, but during the procedure his pressures did go down. But now stabilized on very low-dose Levophed --->being used as much for rate control as blood pressure.  Successful DES PCI of a long lesion in the dominant RCA (synergy 3.0 mm 38 mm postdilated to 3.5-3.6 mm)  Temporary wire placement for symptom medic bradycardia cannot exclude 2: 1 AV block.  RECOMMENDATIONS Admit to inpatient-Continue temporary wire overnight to ensure stable heart rhythm.  Continue IV Levophed infusion to maintain systolic blood pressures greater than 95 and map greater than 65.  Restart IV heparin 8 hours after the radial sheath removal  Convert from simvastatin to high-dose atorvastatin  Restart Lantus with sliding scale insulin  Recommend uninterrupted dual antiplatelet therapy with  for a minimum of .  Need to review Cath Lab images of the ostial circumflex flex lesion with interventional colleagues to determine if there is any PCI options. Recommendation at this time would be to treat medically and reassess for symptoms. If symptomatic 1 to 2 months post MI, would       Post-Intervention Diagram          ------------------------------------------------------------------- 04/21/2018 ECHO Study Conclusions  - Left ventricle: The  cavity size was normal. Wall thickness was   increased in a pattern of mild LVH. Systolic function was normal.   The estimated ejection fraction was in the range of 50% to 55%.   Posterior wall hypokinesis. The study is not technically   sufficient to allow evaluation of LV diastolic function. - Mitral valve: Mildly thickened leaflets . There was trivial   regurgitation. - Left atrium: The atrium was mildly dilated. - Right ventricle: The cavity size was moderately dilated. Severe   hypokinesis. - Tricuspid valve: There was trivial regurgitation.   - Pulmonary arteries: PA peak pressure: 8 mm Hg (S). - Inferior vena cava: The vessel was dilated. The respirophasic   diameter changes were blunted (< 50%), consistent with elevated   central venous pressure. Temporary pacer wire noted in the IVC.  Impressions:  - LVEF 50-55%, mild LVH, posterior wall hypokinesis with severe RV   hypokinesis, suggestive of RV infarct, trivial MR, mild LAE,   trivial TR, low right heart pressure with dilated IVC and   temporary pacer wire noted in the IVC. Echo suggests patient may   need volume if hypotensive due to RV dysfunction.   Patient Profile     SONY SCHLARB is a 84 y.o. male with no prior cardiac history who was in his usual state of health until this morning at roughly 5 AM.  Transferred from urgent care to [facility] for possible pneumonia, found to have inferior ST elevations.   Assessment & Plan    1. Day 2 S/P Inferior STEMI: cath as above. Thrombotic occlusion of RCA  Rx with DES stent. Troponin > 65. BP now 100 - 105; levophed dc'd.   Q waves 3, aVF.  Mild posterior hypok on echo with EF 50 - 55%. Increase fluids with potential RV infarction contributing to hypotension.   2. Concomitant CAD:  Angios reviewed  With  significant multivessel CAD may ultimately need consideration for elective CABG.  3. 2: 1 AV block:  Now in sinus; had transient in early evening HRs in the  40s; now 65.  Will dc pacemaker  4. CKD  Cr 2.66 yesterday increased to 2.84 today; baseline Cr 2 ~ 2 yrs ago; GFR 20 c/w Stage 4.   5. IDDM: insulin increased yesterday with lantus 25; BS today better at 153.  6. COPD:  7. OSA on BiPAP;  BiPAP started last night.  Had long discussion with pt and daughter concerning cardiovascular effects of untreated OSA; only used 3 1/2 hrs night; need to use all night; discyussed REM sleep implications.   8. Anemia : Hb 11.2> 8.9; Hct 37.8 > 28.9.>> 8.1/25.7 today  9. Hyperlipidemia: LDL 62, now on atorvastatin 80 mg which replaced simvastain.   10.  Initiate DVT prophylaxis with sq heparin in light of renal dysfunction.  Once BP stable will need to initiate low dose nitrates and possible low dose BB if HR stable, but will not start today.   CC time 35 minutes Signed, Troy Sine, MD, [facility] 04/22/2018, 7:50 AM

## 2018-04-23 LAB — GLUCOSE, CAPILLARY
GLUCOSE-CAPILLARY: 138 mg/dL — AB (ref 70–99)
GLUCOSE-CAPILLARY: 191 mg/dL — AB (ref 70–99)
Glucose-Capillary: 187 mg/dL — ABNORMAL HIGH (ref 70–99)
Glucose-Capillary: 201 mg/dL — ABNORMAL HIGH (ref 70–99)

## 2018-04-23 LAB — CBC
HCT: 26.3 % — ABNORMAL LOW (ref 39.0–52.0)
HEMOGLOBIN: 8.3 g/dL — AB (ref 13.0–17.0)
MCH: 31.3 pg (ref 26.0–34.0)
MCHC: 31.6 g/dL (ref 30.0–36.0)
MCV: 99.2 fL (ref 80.0–100.0)
NRBC: 0 % (ref 0.0–0.2)
PLATELETS: 185 10*3/uL (ref 150–400)
RBC: 2.65 MIL/uL — AB (ref 4.22–5.81)
RDW: 13.5 % (ref 11.5–15.5)
WBC: 11 10*3/uL — ABNORMAL HIGH (ref 4.0–10.5)

## 2018-04-23 LAB — BASIC METABOLIC PANEL
ANION GAP: 3 — AB (ref 5–15)
BUN: 56 mg/dL — AB (ref 8–23)
CHLORIDE: 115 mmol/L — AB (ref 98–111)
CO2: 22 mmol/L (ref 22–32)
Calcium: 8.3 mg/dL — ABNORMAL LOW (ref 8.9–10.3)
Creatinine, Ser: 2.3 mg/dL — ABNORMAL HIGH (ref 0.61–1.24)
GFR calc Af Amer: 28 mL/min — ABNORMAL LOW (ref >60)
GFR, EST NON AFRICAN AMERICAN: 24 mL/min — AB (ref >60)
Glucose, Bld: 139 mg/dL — ABNORMAL HIGH (ref 70–99)
POTASSIUM: 4.2 mmol/L (ref 3.5–5.1)
SODIUM: 140 mmol/L (ref 135–145)

## 2018-04-23 LAB — IRON AND TIBC
IRON: 21 ug/dL — AB (ref 45–182)
SATURATION RATIOS: 9 % — AB (ref 17.9–39.5)
TIBC: 230 ug/dL — AB (ref 250–450)
UIBC: 209 ug/dL

## 2018-04-23 LAB — FERRITIN: Ferritin: 21 ng/mL — ABNORMAL LOW (ref 24–336)

## 2018-04-23 MED ORDER — ISOSORBIDE MONONITRATE ER 30 MG PO TB24
15.0000 mg | ORAL_TABLET | Freq: Every day | ORAL | Status: DC
  Administered 2018-04-23 – 2018-04-24 (×2): 15 mg via ORAL
  Filled 2018-04-23 (×2): qty 1

## 2018-04-23 NOTE — Progress Notes (Signed)
CARDIAC REHAB PHASE I   PRE:  Rate/Rhythm: 74 SR 1HB  BP:  Supine: 117/51  Sitting:   Standing:    SaO2: 99%RA  MODE:  Ambulation: 400 ft   POST:  Rate/Rhythm: 92 SR  BP:  Supine:   Sitting: 124/51  Standing:    SaO2: 100%RA 1040-1138 Pt walked 400 ft on RA with gait belt use, rolling walker and asst x 2. Gait slow and steady. No CP. To recliner with call bell. Gave MI booklet and reviewed restrictions. Gave stent card and reviewed importance of brilinta. Has brilinta card. Left heart healthy and diabetic diets to review. Discussed CRP 2 and referred to [place]. Will follow up tomorrow.   Graylon Good, RN BSN  04/23/2018 11:33 AM

## 2018-04-23 NOTE — Progress Notes (Addendum)
Progress Note  Patient Name: Clayton [name] Date of Encounter: 04/23/2018  Primary Cardiologist: Dr. Ellyn Hack  Subjective   The patient was resting comfortably in his bed. States that he was able to sleep 8 hrs last night with bipap. He states that he is breathing better and states that he was able to walk around the hallway yesterday.   Inpatient Medications    Scheduled Meds: . aspirin  81 mg Oral Daily  . atorvastatin  80 mg Oral q1800  . famotidine  20 mg Oral QHS  . ferrous sulfate  325 mg Oral Q breakfast  . heparin injection (subcutaneous)  5,000 Units Subcutaneous Q8H  . insulin aspart  0-15 Units Subcutaneous TID WC  . insulin aspart  0-5 Units Subcutaneous QHS  . insulin glargine  25 Units Subcutaneous Daily  . isosorbide mononitrate  15 mg Oral Daily  . Melatonin  3 mg Oral QHS  . mometasone-formoterol  2 puff Inhalation BID  . montelukast  10 mg Oral QHS  . sodium chloride flush  3 mL Intravenous Q12H  . ticagrelor  90 mg Oral BID   Continuous Infusions: . sodium chloride 75 mL/hr at 04/22/18 0801  . sodium chloride Stopped (04/22/18 1327)  . norepinephrine (LEVOPHED) Adult infusion Stopped (04/21/18 1226)   PRN Meds: sodium chloride, acetaminophen, albuterol, ondansetron (ZOFRAN) IV, sodium chloride flush   Vital Signs    Vitals:   04/23/18 0700 04/23/18 0754 04/23/18 0755 04/23/18 0800  BP: (!) 100/53   (!) 111/56  Pulse:      Resp: 15   17  Temp: 98 F (36.7 C)     TempSrc: Axillary     SpO2: 99% 100% 100% 100%  Weight:      Height:        Intake/Output Summary (Last 24 hours) at 04/23/2018 0958 Last data filed at 04/23/2018 0700 Gross per 24 hour  Intake 1496.23 ml  Output 1625 ml  Net -128.77 ml    I/O since admission:   Filed Weights   04/20/18 1536 04/22/18 0530  Weight: 88.5 kg 90.2 kg    Telemetry    Normal sinus rythm - Personally Reviewed  ECG    ECG (independently read by me): None today  Physical Exam    BP (!)  111/56 (BP Location: Right Arm)   Pulse 63   Temp 98 F (36.7 C) (Axillary)   Resp 17   Ht 5\' 10"  (1.778 m)   Wt 90.2 kg   SpO2 100%   BMI 28.53 kg/m  Physical Exam  Constitutional: He appears well-developed and well-nourished. No distress.  HENT:  Head: Normocephalic and atraumatic.  Eyes: Conjunctivae are normal.  Cardiovascular: Normal rate, regular rhythm and normal heart sounds.  Respiratory: Effort normal. No respiratory distress. He has no wheezes.  Diffuse rhonchi  GI: Soft. Bowel sounds are normal. He exhibits no distension. There is no tenderness.  Musculoskeletal: He exhibits no edema.  Neurological: He is alert.  Skin: He is not diaphoretic. No erythema.  Psychiatric: He has a normal mood and affect. His behavior is normal. Judgment and thought content normal.    Labs    Chemistry Recent Labs  Lab 04/20/18 1533  04/21/18 0557 04/22/18 0219 04/23/18 0354  NA 141   < > 133* 138 140  K 3.6   < > 4.3 3.9 4.2  CL 107   < > 106 111 115*  CO2 23  --  19* 19* 22  GLUCOSE 201*   < >  334* 158* 139*  BUN 51*   < > 57* 63* 56*  CREATININE 2.63*   < > 2.66* 2.84* 2.30*  CALCIUM 9.4  --  8.5* 8.6* 8.3*  PROT 6.9  --   --   --   --   ALBUMIN 3.8  --   --   --   --   AST 39  --   --   --   --   ALT 15  --   --   --   --   ALKPHOS 60  --   --   --   --   BILITOT 0.6  --   --   --   --   GFRNONAA 20*  --  20* 18* 24*  GFRAA 23*  --  23* 21* 28*  ANIONGAP 11  --  8 8 3*   < > = values in this interval not displayed.     Hematology Recent Labs  Lab 04/21/18 0557 04/22/18 0219 04/23/18 0354  WBC 13.9* 12.8* 11.0*  RBC 2.92* 2.64* 2.65*  HGB 8.9* 8.1* 8.3*  HCT 28.9* 25.7* 26.3*  MCV 99.0 97.3 99.2  MCH 30.5 30.7 31.3  MCHC 30.8 31.5 31.6  RDW 12.9 13.2 13.5  PLT 264 204 185    Cardiac Enzymes Recent Labs  Lab 04/20/18 1533 04/20/18 1832 04/21/18 0030 04/21/18 0557  TROPONINI 1.04* 11.34* 46.23* 62.25*   No results for input(s): TROPIPOC in the  last 168 hours.   BNPNo results for input(s): BNP, PROBNP in the last 168 hours.   DDimer No results for input(s): DDIMER in the last 168 hours.   Lipid Panel     Component Value Date/Time   CHOL 121 04/20/2018 1533   TRIG 111 04/20/2018 1533   HDL 37 (L) 04/20/2018 1533   CHOLHDL 3.3 04/20/2018 1533   VLDL 22 04/20/2018 1533   LDLCALC 62 04/20/2018 1533     Radiology    Dg Chest Port 1 View  Result Date: 04/22/2018 CLINICAL DATA:  Rhonchi at the lung bases. EXAM: PORTABLE CHEST 1 VIEW COMPARISON:  04/20/2018 and 08/07/2016 and chest CT dated 12/08/2015 FINDINGS: Heart size and pulmonary vascularity are normal. Stable chronic scarring at the left lung base. Tiny area of atelectasis at the right lung base medially is new. No discrete effusions.  No acute bone abnormality. IMPRESSION: Tiny area of atelectasis at the right lung base, new since the prior study. Stable scarring at the left lung base. Electronically Signed   By: Lorriane Shire M.D.   On: 04/22/2018 09:19    Cardiac Studies    CULPRIT LESION: mid RCA-1 lesion is 100% stenosed. (Prox RCA lesion is 55% stenosed tapered to occlusion, Mid RCA-2 lesion is 80% stenosed distal to occlusion)  A drug-eluting stent was successfully placed covering the upstream tapered segment and the downstream lesion, using a STENT SYNERGY DES 3X38. Stent postdilated to 3.6 mm  Post intervention, there is a 0% residual stenosis.  -----------RESIDUAL DISEASE---------------------------------  Mid LM to Ost LAD lesion is 40% stenosed with 85% stenosed side branch in Ost Cx.  Ost Cx to Prox Cx lesion is 65% stenosed with 85% stenosed side branch in Ost 1st Mrg.  Prox LAD-1 lesion is 60% stenosed -just beyond 1st Diag, followed by Prox LAD-2 lesion is 50% stenosed.  Dist LAD lesion is 75% stenosed.  Ost 1st Diag-1 lesion is 65% stenosed followed by focal 90% stenosis at takeoff of small  branch.  -----------------------------------------------------------------  LV end diastolic pressure is mildly elevated.  SUMMARY  Severe multivessel disease with 100% proximal RCA occlusion with 90% ostial circumflex (90% ostial OM1) and 60% mid LAD. ? I discussed findings with Dr. Cyndia Bent on telephone with concern for possible need for CABG-->with renal insufficiency and diabetes and multivessel disease, he felt that the patient would not be a good candidate to take to the OR in the urgent situation. He recommended PCI as best possible.  Mild Cardiogenic Shock-initially requiring low-dose levo fed, but during the procedure his pressures did go down. But now stabilized on very low-dose Levophed --->being used as much for rate control as blood pressure.  Successful DES PCI of a long lesion in the dominant RCA (synergy 3.0 mm 38 mm postdilated to 3.5-3.6 mm)  Temporary wire placement for symptom medic bradycardia cannot exclude 2: 1 AV block.  RECOMMENDATIONS Admit to inpatient-Continue temporary wire overnight to ensure stable heart rhythm.  Continue IV Levophed infusion to maintain systolic blood pressures greater than 95 and map greater than 65.  Restart IV heparin 8 hours after the radial sheath removal  Convert from simvastatin to high-dose atorvastatin  Restart Lantus with sliding scale insulin  Recommend uninterrupted dual antiplatelet therapy with for a minimum of .  Need to review Cath Lab images of the ostial circumflex flex lesion with interventional colleagues to determine if there is any PCI options. Recommendation at this time would be to treat medically and reassess for symptoms. If symptomatic 1 to 2 months post MI, would       Post-Intervention Diagram          ------------------------------------------------------------------- 04/21/2018 ECHO Study Conclusions  - Left ventricle: The cavity size was normal. Wall thickness was increased in  a pattern of mild LVH. Systolic function was normal. The estimated ejection fraction was in the range of 50% to 55%. Posterior wall hypokinesis. The study is not technically sufficient to allow evaluation of LV diastolic function. - Mitral valve: Mildly thickened leaflets . There was trivial regurgitation. - Left atrium: The atrium was mildly dilated. - Right ventricle: The cavity size was moderately dilated. Severe hypokinesis. - Tricuspid valve: There was trivial regurgitation.   - Pulmonary arteries: PA peak pressure: 8 mm Hg (S). - Inferior vena cava: The vessel was dilated. The respirophasic diameter changes were blunted (<50%), consistent with elevated central venous pressure. Temporary pacer wire noted in the IVC.  Impressions:  - LVEF 50-55%, mild LVH, posterior wall hypokinesis with severe RV hypokinesis, suggestive of RV infarct, trivial MR, mild LAE, trivial TR, low right heart pressure with dilated IVC and temporary pacer wire noted in the IVC. Echo suggests patient may need volume if hypotensive due to RV dysfunction.   Patient Profile     84 y.o. male with no significant cardiac history, DM 2, CKD3, COPD who presented with lightheadedness. Found to have inferior wall STEMI on admission.    Assessment & Plan    Inferior wall STEMI s/p des stent (Day 3): Normal sinus rhythm without any st or t wave changes. He has not had any new chest pain.  Patient off of levophed and doing well with NS at 18ml/hr yesterday. Will discontinue fluids today. Was in first degree AV block previously, but discontinued yesterday.  -Started imdur 15mg  qd -Aspirin 81mg  -Lipitor 80mg  qd  -Ticagrelor 90mg  bid  COPD Acute respiratory distress  Patient has diffuse rhonchi noted on exam. Chest xray 11/5 shows atelectasis in right lung base.   -Continue dulera -Continue montelukast  10mg  qhs  -Continue proventil q6hrs prn   OSA Patient to continue bipap  nightly  Diabetes Mellitus type 2 The patient's blood glucose have ranged 130-140s.   -Continue lantus 25u qd  -Continue SSI  CKD3 Patient's cr=2.3 today and GFR 24, baseline 2-2.5.   Normocytic anemia Hb=8.3, hct=26.3, baseline 10-11. Likely blood loss from procedure.   -iron studies ordered   Lars Mage, MD Internal Medicine PGY2 Pager:[phone] 04/23/2018, 11:26 AM   Patient seen and examined. Agree with assessment and plan.  Feels well denies any chest pain or shortness of breath.  He used BiPAP last evening for 8 hours and is much more alert today.  Blood pressure is stable at 112/60 is no JVD.  He has scattered rhonchi which improved with coughing.  Rhythm is regular with 1/6 systolic murmur.  Abdomen is soft nontender.  Right groin site is stable, status post temporary pacemaker removal yesterday.  No hematoma.  No significant edema.  Renal function is improved with hydration is now 2.3.  Will decrease rate n.p.o. since eating well presently.  Will initiate low-dose nitrates with concomitant CAD and ultimately if heart rate remains stable may be able to add low-dose beta-blocker therapy.  Will transfer to telemetry.  Review angiograms with colleagues.   Troy Sine, MD, [facility] 04/23/2018 1:14 PM

## 2018-04-23 NOTE — Progress Notes (Signed)
Inpatient Diabetes Program Recommendations  AACE/ADA: New Consensus Statement on Inpatient Glycemic Control (2019)  Target Ranges:  Prepandial:   less than 140 mg/dL      Peak postprandial:   less than 180 mg/dL (1-2 hours)      Critically ill patients:  140 - 180 mg/dL   Results for BILBO, CARCAMO (MRN [id number]) as of 04/23/2018 12:38  Ref. Range 04/22/2018 06:43 04/22/2018 11:02 04/22/2018 17:08 04/22/2018 21:23 04/23/2018 06:41 04/23/2018 11:38  Glucose-Capillary Latest Ref Range: 70 - 99 mg/dL 153 (H) 227 (H) 206 (H) 147 (H) 138 (H) 187 (H)   Review of Glycemic Control  Diabetes history: DM2 Outpatient Diabetes medications: Lantus 24 units QAM Current orders for Inpatient glycemic control: Lantus 25 units daily, Novolog 0-15 units TID with meals, Novolog 0-5 units QHS  Inpatient Diabetes Program Recommendations:  Insulin - Meal Coverage: Please consider ordering Novolog 3 units TID with meals for meal coverge if patient eats at least 50% of meals.  Thanks, Barnie Alderman, RN, MSN, CDE Diabetes Coordinator Inpatient Diabetes Program [phone] (Team Pager from 8am to 5pm)

## 2018-04-24 LAB — BASIC METABOLIC PANEL
Anion gap: 4 — ABNORMAL LOW (ref 5–15)
BUN: 51 mg/dL — ABNORMAL HIGH (ref 8–23)
CO2: 19 mmol/L — ABNORMAL LOW (ref 22–32)
CREATININE: 2.1 mg/dL — AB (ref 0.61–1.24)
Calcium: 8.4 mg/dL — ABNORMAL LOW (ref 8.9–10.3)
Chloride: 116 mmol/L — ABNORMAL HIGH (ref 98–111)
GFR calc Af Amer: 31 mL/min — ABNORMAL LOW (ref >60)
GFR, EST NON AFRICAN AMERICAN: 26 mL/min — AB (ref >60)
Glucose, Bld: 165 mg/dL — ABNORMAL HIGH (ref 70–99)
Potassium: 4.1 mmol/L (ref 3.5–5.1)
SODIUM: 139 mmol/L (ref 135–145)

## 2018-04-24 LAB — CBC
HEMATOCRIT: 25 % — AB (ref 39.0–52.0)
Hemoglobin: 7.8 g/dL — ABNORMAL LOW (ref 13.0–17.0)
MCH: 31 pg (ref 26.0–34.0)
MCHC: 31.2 g/dL (ref 30.0–36.0)
MCV: 99.2 fL (ref 80.0–100.0)
Platelets: 173 10*3/uL (ref 150–400)
RBC: 2.52 MIL/uL — ABNORMAL LOW (ref 4.22–5.81)
RDW: 13.5 % (ref 11.5–15.5)
WBC: 10.1 10*3/uL (ref 4.0–10.5)
nRBC: 0 % (ref 0.0–0.2)

## 2018-04-24 LAB — GLUCOSE, CAPILLARY
GLUCOSE-CAPILLARY: 198 mg/dL — AB (ref 70–99)
GLUCOSE-CAPILLARY: 95 mg/dL (ref 70–99)
GLUCOSE-CAPILLARY: 165 mg/dL — AB (ref 70–99)
Glucose-Capillary: 150 mg/dL — ABNORMAL HIGH (ref 70–99)

## 2018-04-24 MED ORDER — INSULIN ASPART 100 UNIT/ML ~~LOC~~ SOLN
2.0000 [IU] | Freq: Three times a day (TID) | SUBCUTANEOUS | Status: DC
  Administered 2018-04-24 – 2018-04-26 (×5): 2 [IU] via SUBCUTANEOUS

## 2018-04-24 MED ORDER — ISOSORBIDE MONONITRATE ER 30 MG PO TB24
30.0000 mg | ORAL_TABLET | Freq: Every day | ORAL | Status: DC
  Administered 2018-04-25 – 2018-04-26 (×2): 30 mg via ORAL
  Filled 2018-04-24 (×2): qty 1

## 2018-04-24 NOTE — Progress Notes (Signed)
RT placed patient on BIPAP HS on the dream station. NO O2 bleed in needed. Patient tolerating well at this time.

## 2018-04-24 NOTE — Progress Notes (Signed)
Pt refused for the bed alarm to be turned on

## 2018-04-24 NOTE — Progress Notes (Signed)
[id number] Offered to walk with pt but he stated he had just gotten in bed from sitting in recliner most of day. He also walked with PT earlier. Completed MI ed with pt who voiced understanding. Reviewed NTG use, ex ed and reinforced importance of brilinta. Discussed heart healthy food choices. Graylon Good RN BSN 04/24/2018 2:07 PM

## 2018-04-24 NOTE — Progress Notes (Signed)
RT placed pt on BIPAP for the night. Pt is in BIPAP mode on dreamstation. RT will continue to monitor.

## 2018-04-24 NOTE — Progress Notes (Signed)
Noted Physical Therapy recomends HHPT; patient is refusing all HHC at this time also stated that he does not need a 3:1 at discharge; CM will continue to follow for progression of care. Mindi Slicker [facility] [phone]

## 2018-04-24 NOTE — Evaluation (Signed)
Physical Therapy Evaluation & Discharge Patient Details Name: Clayton [name] MRN: [id number] DOB: 1928-05-06 Today's Date: 04/24/2018   History of Present Illness  Pt is an 84 y.o. male admitted 04/20/18 with possible pneumonia, found to have inferior STEMI. S/p emergent cath and des stent. Echo 11/4. PMH includes HTN, CKDIV, DM.    Clinical Impression  Patient evaluated by Physical Therapy with no further acute PT needs identified. PTA, pt mod indep with intermittent use of SPC; lives with daughter. Today, pt mod indep with RW; encouraged use of RW at home for added stability and energy conservation. Would also benefit from 3-in-1 to use as shower chair. All education has been completed and the patient has no further questions. Acute PT is signing off. Thank you for this referral.    Follow Up Recommendations Home health PT;Supervision - Intermittent    Equipment Recommendations  3in1 (PT)    Recommendations for Other Services       Precautions / Restrictions Precautions Precautions: None Restrictions Weight Bearing Restrictions: No      Mobility  Bed Mobility               General bed mobility comments: Received sitting in recliner  Transfers Overall transfer level: Modified independent Equipment used: Rolling walker (2 wheeled)             General transfer comment: Reliant on momentum to come into standing  Ambulation/Gait Ambulation/Gait assistance: Modified independent (Device/Increase time) Gait Distance (Feet): 250 Feet Assistive device: Rolling walker (2 wheeled) Gait Pattern/deviations: Step-through pattern;Decreased stride length;Trunk flexed Gait velocity: Decreased Gait velocity interpretation: 1.31 - 2.62 ft/sec, indicative of limited community ambulator General Gait Details: Slow, steady amb; cues for upright posture. Pt able to increase gait speed when cued  Stairs            Wheelchair Mobility    Modified Rankin (Stroke Patients  Only)       Balance Overall balance assessment: Needs assistance   Sitting balance-Leahy Scale: Good       Standing balance-Leahy Scale: Fair Standing balance comment: Can stand and amb without DME and supervision                             Pertinent Vitals/Pain Pain Assessment: No/denies pain    Home Living Family/patient expects to be discharged to:: Private residence Living Arrangements: Children Available Help at Discharge: Family;Available 24 hours/day Type of Home: House Home Access: Ramped entrance     Home Layout: One level Home Equipment: Walker - 2 wheels;Cane - single point;Grab bars - toilet      Prior Function Level of Independence: Independent with assistive device(s)         Comments: Mod indep with intermittent use of SPC; typically does not use DME in household. Per pt, daughter asking him to use RW     Hand Dominance        Extremity/Trunk Assessment   Upper Extremity Assessment Upper Extremity Assessment: Overall WFL for tasks assessed    Lower Extremity Assessment Lower Extremity Assessment: Overall WFL for tasks assessed    Cervical / Trunk Assessment Cervical / Trunk Assessment: Kyphotic  Communication   Communication: No difficulties  Cognition Arousal/Alertness: Awake/alert Behavior During Therapy: WFL for tasks assessed/performed Overall Cognitive Status: Within Functional Limits for tasks assessed  General Comments General comments (skin integrity, edema, etc.): SpO2 95% on RA    Exercises     Assessment/Plan    PT Assessment All further PT needs can be met in the next venue of care  PT Problem List Decreased activity tolerance;Decreased balance;Decreased mobility;Decreased knowledge of use of DME;Cardiopulmonary status limiting activity       PT Treatment Interventions      PT Goals (Current goals can be found in the Care Plan section)  Acute  Rehab PT Goals PT Goal Formulation: All assessment and education complete, DC therapy    Frequency     Barriers to discharge        Co-evaluation               AM-PAC PT "6 Clicks" Daily Activity  Outcome Measure Difficulty turning over in bed (including adjusting bedclothes, sheets and blankets)?: None Difficulty moving from lying on back to sitting on the side of the bed? : None Difficulty sitting down on and standing up from a chair with arms (e.g., wheelchair, bedside commode, etc,.)?: None Help needed moving to and from a bed to chair (including a wheelchair)?: None Help needed walking in hospital room?: None Help needed climbing 3-5 steps with a railing? : A Little 6 Click Score: 23    End of Session   Activity Tolerance: Patient tolerated treatment well Patient left: in chair;with call bell/phone within reach Nurse Communication: Mobility status PT Visit Diagnosis: Other abnormalities of gait and mobility (R26.89)    Time: 8325-4982 PT Time Calculation (min) (ACUTE ONLY): 20 min   Charges:   PT Evaluation $PT Eval Moderate Complexity: Hunter, PT, DPT Acute Rehabilitation Services  Pager [phone] Office [place] 04/24/2018, 10:19 AM

## 2018-04-24 NOTE — Progress Notes (Addendum)
Progress Note  Patient Name: Clayton [name] Date of Encounter: 04/24/2018  Primary Cardiologist: Dr. Ellyn Hack  Subjective   The patient was seen sitting up in chair by bedside. He stated that he was doing well and did not have any chest pain or dyspnea.   Inpatient Medications    Scheduled Meds: . aspirin  81 mg Oral Daily  . atorvastatin  80 mg Oral q1800  . famotidine  20 mg Oral QHS  . ferrous sulfate  325 mg Oral Q breakfast  . heparin injection (subcutaneous)  5,000 Units Subcutaneous Q8H  . insulin aspart  0-15 Units Subcutaneous TID WC  . insulin aspart  0-5 Units Subcutaneous QHS  . insulin glargine  25 Units Subcutaneous Daily  . isosorbide mononitrate  15 mg Oral Daily  . Melatonin  3 mg Oral QHS  . mometasone-formoterol  2 puff Inhalation BID  . montelukast  10 mg Oral QHS  . sodium chloride flush  3 mL Intravenous Q12H  . ticagrelor  90 mg Oral BID   Continuous Infusions: . sodium chloride Stopped (04/23/18 1405)  . sodium chloride Stopped (04/22/18 1327)  . norepinephrine (LEVOPHED) Adult infusion Stopped (04/21/18 1226)   PRN Meds: sodium chloride, acetaminophen, albuterol, ondansetron (ZOFRAN) IV, sodium chloride flush   Vital Signs    Vitals:   04/23/18 2140 04/24/18 0009 04/24/18 0023 04/24/18 0418  BP: (!) 129/58  (!) 119/59 (!) 138/55  Pulse: 68  70 74  Resp: 18  20 20   Temp: 97.7 F (36.5 C)   98.2 F (36.8 C)  TempSrc: Oral   Oral  SpO2: 100% 100% 100% 98%  Weight: 91.5 kg   91 kg  Height: 5\' 10"  (1.778 m)       Intake/Output Summary (Last 24 hours) at 04/24/2018 0713 Last data filed at 04/24/2018 0711 Gross per 24 hour  Intake 3570.39 ml  Output 1825 ml  Net 1745.39 ml    I/O since admission:   Filed Weights   04/22/18 0530 04/23/18 2140 04/24/18 0418  Weight: 90.2 kg 91.5 kg 91 kg    Telemetry    2ng degree av block, type 1- Personally Reviewed  ECG    ECG (independently read by me): Normal sinus rhythm without  significant st or t wave abnormalities  Physical Exam    BP (!) 138/55 (BP Location: Left Arm)   Pulse 74   Temp 98.2 F (36.8 C) (Oral)   Resp 20   Ht 5\' 10"  (1.778 m)   Wt 91 kg Comment: scale c  SpO2 98%   BMI 28.78 kg/m   Physical Exam  Constitutional: He appears well-developed and well-nourished. No distress.  HENT:  Head: Normocephalic and atraumatic.  Eyes: Conjunctivae are normal.  Cardiovascular: Normal rate, regular rhythm and normal heart sounds.  Respiratory: Effort normal and breath sounds normal. No respiratory distress. He has no wheezes.  GI: Soft. Bowel sounds are normal. He exhibits no distension. There is no tenderness.  Musculoskeletal: He exhibits no edema.  Neurological: He is alert.  Skin: He is not diaphoretic. No erythema.  Psychiatric: He has a normal mood and affect. His behavior is normal. Judgment and thought content normal.    Labs    Chemistry Recent Labs  Lab 04/20/18 1533  04/22/18 0219 04/23/18 0354 04/24/18 0423  NA 141   < > 138 140 139  K 3.6   < > 3.9 4.2 4.1  CL 107   < > 111 115* 116*  CO2 23   < > 19* 22 19*  GLUCOSE 201*   < > 158* 139* 165*  BUN 51*   < > 63* 56* 51*  CREATININE 2.63*   < > 2.84* 2.30* 2.10*  CALCIUM 9.4   < > 8.6* 8.3* 8.4*  PROT 6.9  --   --   --   --   ALBUMIN 3.8  --   --   --   --   AST 39  --   --   --   --   ALT 15  --   --   --   --   ALKPHOS 60  --   --   --   --   BILITOT 0.6  --   --   --   --   GFRNONAA 20*   < > 18* 24* 26*  GFRAA 23*   < > 21* 28* 31*  ANIONGAP 11   < > 8 3* 4*   < > = values in this interval not displayed.     Hematology Recent Labs  Lab 04/22/18 0219 04/23/18 0354 04/24/18 0423  WBC 12.8* 11.0* 10.1  RBC 2.64* 2.65* 2.52*  HGB 8.1* 8.3* 7.8*  HCT 25.7* 26.3* 25.0*  MCV 97.3 99.2 99.2  MCH 30.7 31.3 31.0  MCHC 31.5 31.6 31.2  RDW 13.2 13.5 13.5  PLT 204 185 173    Cardiac Enzymes Recent Labs  Lab 04/20/18 1533 04/20/18 1832 04/21/18 0030  04/21/18 0557  TROPONINI 1.04* 11.34* 46.23* 62.25*   No results for input(s): TROPIPOC in the last 168 hours.   BNPNo results for input(s): BNP, PROBNP in the last 168 hours.   DDimer No results for input(s): DDIMER in the last 168 hours.   Lipid Panel     Component Value Date/Time   CHOL 121 04/20/2018 1533   TRIG 111 04/20/2018 1533   HDL 37 (L) 04/20/2018 1533   CHOLHDL 3.3 04/20/2018 1533   VLDL 22 04/20/2018 1533   LDLCALC 62 04/20/2018 1533     Radiology    Dg Chest Port 1 View  Result Date: 04/22/2018 CLINICAL DATA:  Rhonchi at the lung bases. EXAM: PORTABLE CHEST 1 VIEW COMPARISON:  04/20/2018 and 08/07/2016 and chest CT dated 12/08/2015 FINDINGS: Heart size and pulmonary vascularity are normal. Stable chronic scarring at the left lung base. Tiny area of atelectasis at the right lung base medially is new. No discrete effusions.  No acute bone abnormality. IMPRESSION: Tiny area of atelectasis at the right lung base, new since the prior study. Stable scarring at the left lung base. Electronically Signed   By: Lorriane Shire M.D.   On: 04/22/2018 09:19    Cardiac Studies    CULPRIT LESION: mid RCA-1 lesion is 100% stenosed. (Prox RCA lesion is 55% stenosed tapered to occlusion, Mid RCA-2 lesion is 80% stenosed distal to occlusion)  A drug-eluting stent was successfully placed covering the upstream tapered segment and the downstream lesion, using a STENT SYNERGY DES 3X38. Stent postdilated to 3.6 mm  Post intervention, there is a 0% residual stenosis.  -----------RESIDUAL DISEASE---------------------------------  Mid LM to Ost LAD lesion is 40% stenosed with 85% stenosed side branch in Ost Cx.  Ost Cx to Prox Cx lesion is 65% stenosed with 85% stenosed side branch in Ost 1st Mrg.  Prox LAD-1 lesion is 60% stenosed -just beyond 1st Diag, followed by Prox LAD-2 lesion is 50% stenosed.  Dist LAD lesion is 75% stenosed.  Colon Flattery  1st Diag-1 lesion is 65% stenosed followed by  focal 90% stenosis at takeoff of small branch.  -----------------------------------------------------------------  LV end diastolic pressure is mildly elevated.  SUMMARY  Severe multivessel disease with 100% proximal RCA occlusion with 90% ostial circumflex (90% ostial OM1) and 60% mid LAD. ? I discussed findings with Dr. Cyndia Bent on telephone with concern for possible need for CABG-->with renal insufficiency and diabetes and multivessel disease, he felt that the patient would not be a good candidate to take to the OR in the urgent situation. He recommended PCI as best possible.  Mild Cardiogenic Shock-initially requiring low-dose levo fed, but during the procedure his pressures did go down. But now stabilized on very low-dose Levophed --->being used as much for rate control as blood pressure.  Successful DES PCI of a long lesion in the dominant RCA (synergy 3.0 mm 38 mm postdilated to 3.5-3.6 mm)  Temporary wire placement for symptom medic bradycardia cannot exclude 2: 1 AV block.  RECOMMENDATIONS Admit to inpatient-Continue temporary wire overnight to ensure stable heart rhythm.  Continue IV Levophed infusion to maintain systolic blood pressures greater than 95 and map greater than 65.  Restart IV heparin 8 hours after the radial sheath removal  Convert from simvastatin to high-dose atorvastatin  Restart Lantus with sliding scale insulin  Recommend uninterrupted dual antiplatelet therapy with for a minimum of .  Need to review Cath Lab images of the ostial circumflex flex lesion with interventional colleagues to determine if there is any PCI options. Recommendation at this time would be to treat medically and reassess for symptoms. If symptomatic 1 to 2 months post MI, would       Post-Intervention Diagram          ------------------------------------------------------------------- 04/21/2018 ECHOStudy Conclusions  - Left ventricle: The cavity size was  normal. Wall thickness was increased in a pattern of mild LVH. Systolic function was normal. The estimated ejection fraction was in the range of 50% to 55%. Posterior wall hypokinesis. The study is not technically sufficient to allow evaluation of LV diastolic function. - Mitral valve: Mildly thickened leaflets . There was trivial regurgitation. - Left atrium: The atrium was mildly dilated. - Right ventricle: The cavity size was moderately dilated. Severe hypokinesis. - Tricuspid valve: There was trivial regurgitation.  - Pulmonary arteries: PA peak pressure: 8 mm Hg (S). - Inferior vena cava: The vessel was dilated. The respirophasic diameter changes were blunted (<50%), consistent with elevated central venous pressure. Temporary pacer wire noted in the IVC.  Impressions:  - LVEF 50-55%, mild LVH, posterior wall hypokinesis with severe RV hypokinesis, suggestive of RV infarct, trivial MR, mild LAE, trivial TR, low right heart pressure with dilated IVC and temporary pacer wire noted in the IVC. Echo suggests patient may need volume if hypotensive due to RV dysfunction.   Patient Profile     84 y.o. male with no significant cardiac history, DM 2, CKD3, COPD who presented with lightheadedness. Found to have inferior wall STEMI on admission.    Assessment & Plan     Inferior wall STEMI s/p des stent (Day 3): Normal sinus rhythm without any st or t wave changes on ekg today 04/24/18. He has not had any new chest pain. Worked with cardiac rehab and was able to ambulate 423ft without any chest pain.   -contiunue imdur 15mg  qd -Aspirin 81mg  -Lipitor 80mg  qd  -Ticagrelor 90mg  bid -Cardiac rehab to follow  COPD Acute respiratory distress  Patient has diffuse rhonchi noted on exam.  Chest xray 11/5 shows atelectasis in right lung base.   -Continue dulera -Continue montelukast 10mg  qhs  -Continue proventil q6hrs prn   OSA Patient to continue bipap  nightly. Patient used it for 7.5hrs yesterday night.  Diabetes Mellitus type 2 The patient's blood glucose have ranged 160-190s.  -Continue lantus 25u qd  -Continue SSI -Added novolog 2u TIDWC  CKD3 Patient's cr=2.1 today, baseline 2-2.5.   Normocytic anemia Hb=7.8, hct=25, baseline 10-11. Ferritin=21,  Iron=21, tibc=230  Anemia likely due to combination of blood loss from procedure and anemia of chronic disease.    Lars Mage, MD Internal Medicine PGY2 VZSMO:[phone] 04/24/2018, 7:13 AM    Patient seen and examined. Agree with assessment and plan.  Patient is feeling well.  He is without chest pain.  ECG had shown probable low atrial rhythm.  Telemetry shows intermittent Wenckebach block.  BP table.  No wheezing or rhonchi. With concomitant CAD will titrate isosorbide 30 mg.  Unable to initiate low-dose beta-blocker therapy due to transient second-degree type I block.  Creatinine continues to slowly improve at 2.1 today.  Utilize BiPAP for 7.5 hours sleep was restorative.  Plan for initial medical therapy for concomitant CAD particularly with continued renal insufficiency.  Possible  discharge in 24 to 48 hours if remains stable.  Troy Sine, MD, [facility] 04/24/2018 12:13 PM

## 2018-04-24 NOTE — Plan of Care (Signed)
  Problem: Clinical Measurements: Goal: Respiratory complications will improve Outcome: Progressing   Problem: Activity: Goal: Risk for activity intolerance will decrease Outcome: Progressing   Problem: Coping: Goal: Level of anxiety will decrease Outcome: Progressing   Problem: Elimination: Goal: Will not experience complications related to urinary retention Outcome: Progressing   Problem: Pain Managment: Goal: General experience of comfort will improve Outcome: Progressing   Problem: Safety: Goal: Ability to remain free from injury will improve Outcome: Progressing   Problem: Skin Integrity: Goal: Risk for impaired skin integrity will decrease Outcome: Progressing

## 2018-04-24 NOTE — Progress Notes (Signed)
   04/23/18 2140  Vitals  Temp 97.7 F (36.5 C)  Temp Source Oral  BP (!) 129/58  MAP (mmHg) 79  BP Location Left Arm  BP Method Automatic  Patient Position (if appropriate) Lying  Pulse Rate 68  Pulse Rate Source Monitor  Resp 18  Oxygen Therapy  SpO2 100 %  O2 Device Room Air  Height and Weight  Height 5\' 10"  (1.778 m)  Weight 91.5 kg (scale c)  Type of Scale Used Standing  BSA (Calculated - sq m) 2.13 sq meters  BMI (Calculated) 28.96  Weight in (lb) to have BMI = 25 173.9  Pt has arrived to 3e13.  Pt pain level is 0.  He was placed on telemetry and oriented to the room.

## 2018-04-25 DIAGNOSIS — I441 Atrioventricular block, second degree: Secondary | ICD-10-CM

## 2018-04-25 DIAGNOSIS — I255 Ischemic cardiomyopathy: Secondary | ICD-10-CM

## 2018-04-25 LAB — GLUCOSE, CAPILLARY
GLUCOSE-CAPILLARY: 114 mg/dL — AB (ref 70–99)
Glucose-Capillary: 180 mg/dL — ABNORMAL HIGH (ref 70–99)
Glucose-Capillary: 138 mg/dL — ABNORMAL HIGH (ref 70–99)

## 2018-04-25 NOTE — Progress Notes (Signed)
Pt had sinus brady of 39 on the monitor while asleep. No symptoms noted. Will continue monitoring.

## 2018-04-25 NOTE — Care Management Important Message (Signed)
Important Message  Patient Details  Name: Clayton [name] MRN: [id number] Date of Birth: Jan 23, 1928   Medicare Important Message Given:  Yes    Barb Merino Dynver Clemson 04/25/2018, 4:48 PM

## 2018-04-25 NOTE — Progress Notes (Signed)
Pt ambulated with assistance of walker stand-by assistance to chair.

## 2018-04-25 NOTE — Progress Notes (Addendum)
Progress Note  Patient Name: Clayton [name] Date of Encounter: 04/25/2018  Primary Cardiologist: Dr. Ellyn Hack   Subjective   The patient has been having some bradycardia episodes starting last night. The patient states that he has not have any symptomatic discomfort.   Inpatient Medications    Scheduled Meds: . aspirin  81 mg Oral Daily  . atorvastatin  80 mg Oral q1800  . famotidine  20 mg Oral QHS  . ferrous sulfate  325 mg Oral Q breakfast  . heparin injection (subcutaneous)  5,000 Units Subcutaneous Q8H  . insulin aspart  0-15 Units Subcutaneous TID WC  . insulin aspart  0-5 Units Subcutaneous QHS  . insulin aspart  2 Units Subcutaneous TID WC  . insulin glargine  25 Units Subcutaneous Daily  . isosorbide mononitrate  30 mg Oral Daily  . Melatonin  3 mg Oral QHS  . mometasone-formoterol  2 puff Inhalation BID  . montelukast  10 mg Oral QHS  . sodium chloride flush  3 mL Intravenous Q12H  . ticagrelor  90 mg Oral BID   Continuous Infusions: . sodium chloride Stopped (04/23/18 1405)  . sodium chloride Stopped (04/22/18 1327)   PRN Meds: sodium chloride, acetaminophen, albuterol, ondansetron (ZOFRAN) IV, sodium chloride flush   Vital Signs    Vitals:   04/24/18 1635 04/24/18 1905 04/24/18 1945 04/25/18 0446  BP: 107/66  (!) 120/48 (!) 133/51  Pulse: 70  (!) 46 63  Resp: 18  19 20   Temp: 97.8 F (36.6 C)  98.3 F (36.8 C) 99 F (37.2 C)  TempSrc: Oral  Oral Oral  SpO2: 100% 99% 96% 99%  Weight:    91 kg  Height:        Intake/Output Summary (Last 24 hours) at 04/25/2018 0845 Last data filed at 04/25/2018 0746 Gross per 24 hour  Intake 240 ml  Output 1300 ml  Net -1060 ml    I/O since admission:   Filed Weights   04/23/18 2140 04/24/18 0418 04/25/18 0446  Weight: 91.5 kg 91 kg 91 kg    Telemetry    Normal sinus rhythm, but with episodes of bradycardia- Personally Reviewed  ECG    ECG (independently read by me): Normal sinus rhythm, second  degree av block type 1  Physical Exam    BP (!) 133/51 (BP Location: Left Arm)   Pulse 63   Temp 99 F (37.2 C) (Oral)   Resp 20   Ht 5\' 10"  (1.778 m)   Wt 91 kg   SpO2 99%   BMI 28.80 kg/m   Physical Exam  Constitutional: He appears well-developed and well-nourished. No distress.  HENT:  Head: Normocephalic and atraumatic.  Eyes: Conjunctivae are normal.  Cardiovascular: Normal rate, regular rhythm and normal heart sounds.  Respiratory: Effort normal and breath sounds normal. No respiratory distress.  GI: Soft. Bowel sounds are normal. He exhibits no distension. There is no tenderness.  Musculoskeletal: He exhibits no edema.  Neurological: He is alert.  Skin: He is not diaphoretic. No erythema.  Psychiatric: He has a normal mood and affect. His behavior is normal. Judgment and thought content normal.    Labs    Chemistry Recent Labs  Lab 04/20/18 1533  04/22/18 0219 04/23/18 0354 04/24/18 0423  NA 141   < > 138 140 139  K 3.6   < > 3.9 4.2 4.1  CL 107   < > 111 115* 116*  CO2 23   < > 19* 22  19*  GLUCOSE 201*   < > 158* 139* 165*  BUN 51*   < > 63* 56* 51*  CREATININE 2.63*   < > 2.84* 2.30* 2.10*  CALCIUM 9.4   < > 8.6* 8.3* 8.4*  PROT 6.9  --   --   --   --   ALBUMIN 3.8  --   --   --   --   AST 39  --   --   --   --   ALT 15  --   --   --   --   ALKPHOS 60  --   --   --   --   BILITOT 0.6  --   --   --   --   GFRNONAA 20*   < > 18* 24* 26*  GFRAA 23*   < > 21* 28* 31*  ANIONGAP 11   < > 8 3* 4*   < > = values in this interval not displayed.     Hematology Recent Labs  Lab 04/22/18 0219 04/23/18 0354 04/24/18 0423  WBC 12.8* 11.0* 10.1  RBC 2.64* 2.65* 2.52*  HGB 8.1* 8.3* 7.8*  HCT 25.7* 26.3* 25.0*  MCV 97.3 99.2 99.2  MCH 30.7 31.3 31.0  MCHC 31.5 31.6 31.2  RDW 13.2 13.5 13.5  PLT 204 185 173    Cardiac Enzymes Recent Labs  Lab 04/20/18 1533 04/20/18 1832 04/21/18 0030 04/21/18 0557  TROPONINI 1.04* 11.34* 46.23* 62.25*   No  results for input(s): TROPIPOC in the last 168 hours.   BNPNo results for input(s): BNP, PROBNP in the last 168 hours.   DDimer No results for input(s): DDIMER in the last 168 hours.   Lipid Panel     Component Value Date/Time   CHOL 121 04/20/2018 1533   TRIG 111 04/20/2018 1533   HDL 37 (L) 04/20/2018 1533   CHOLHDL 3.3 04/20/2018 1533   VLDL 22 04/20/2018 1533   LDLCALC 62 04/20/2018 1533     Radiology    No results found.  Cardiac Studies    CULPRIT LESION: mid RCA-1 lesion is 100% stenosed. (Prox RCA lesion is 55% stenosed tapered to occlusion, Mid RCA-2 lesion is 80% stenosed distal to occlusion)  A drug-eluting stent was successfully placed covering the upstream tapered segment and the downstream lesion, using a STENT SYNERGY DES 3X38. Stent postdilated to 3.6 mm  Post intervention, there is a 0% residual stenosis.  -----------RESIDUAL DISEASE---------------------------------  Mid LM to Ost LAD lesion is 40% stenosed with 85% stenosed side branch in Ost Cx.  Ost Cx to Prox Cx lesion is 65% stenosed with 85% stenosed side branch in Ost 1st Mrg.  Prox LAD-1 lesion is 60% stenosed -just beyond 1st Diag, followed by Prox LAD-2 lesion is 50% stenosed.  Dist LAD lesion is 75% stenosed.  Ost 1st Diag-1 lesion is 65% stenosed followed by focal 90% stenosis at takeoff of small branch.  -----------------------------------------------------------------  LV end diastolic pressure is mildly elevated.  SUMMARY  Severe multivessel disease with 100% proximal RCA occlusion with 90% ostial circumflex (90% ostial OM1) and 60% mid LAD. ? I discussed findings with Dr. Cyndia Bent on telephone with concern for possible need for CABG-->with renal insufficiency and diabetes and multivessel disease, he felt that the patient would not be a good candidate to take to the OR in the urgent situation. He recommended PCI as best possible.  Mild Cardiogenic Shock-initially requiring low-dose  levo fed, but during the procedure his  pressures did go down. But now stabilized on very low-dose Levophed --->being used as much for rate control as blood pressure.  Successful DES PCI of a long lesion in the dominant RCA (synergy 3.0 mm 38 mm postdilated to 3.5-3.6 mm)  Temporary wire placement for symptom medic bradycardia cannot exclude 2: 1 AV block.  RECOMMENDATIONS Admit to inpatient-Continue temporary wire overnight to ensure stable heart rhythm.  Continue IV Levophed infusion to maintain systolic blood pressures greater than 95 and map greater than 65.  Restart IV heparin 8 hours after the radial sheath removal  Convert from simvastatin to high-dose atorvastatin  Restart Lantus with sliding scale insulin  Recommend uninterrupted dual antiplatelet therapy with for a minimum of .  Need to review Cath Lab images of the ostial circumflex flex lesion with interventional colleagues to determine if there is any PCI options. Recommendation at this time would be to treat medically and reassess for symptoms. If symptomatic 1 to 2 months post MI, would       Post-Intervention Diagram          ------------------------------------------------------------------- 04/21/2018 ECHOStudy Conclusions  - Left ventricle: The cavity size was normal. Wall thickness was increased in a pattern of mild LVH. Systolic function was normal. The estimated ejection fraction was in the range of 50% to 55%. Posterior wall hypokinesis. The study is not technically sufficient to allow evaluation of LV diastolic function. - Mitral valve: Mildly thickened leaflets . There was trivial regurgitation. - Left atrium: The atrium was mildly dilated. - Right ventricle: The cavity size was moderately dilated. Severe hypokinesis. - Tricuspid valve: There was trivial regurgitation.  - Pulmonary arteries: PA peak pressure: 8 mm Hg (S). - Inferior vena cava: The vessel was dilated. The  respirophasic diameter changes were blunted (<50%), consistent with elevated central venous pressure. Temporary pacer wire noted in the IVC.  Impressions:  - LVEF 50-55%, mild LVH, posterior wall hypokinesis with severe RV hypokinesis, suggestive of RV infarct, trivial MR, mild LAE, trivial TR, low right heart pressure with dilated IVC and temporary pacer wire noted in the IVC. Echo suggests patient may need volume if hypotensive due to RV dysfunction.   Patient Profile     84 y.o. male  with no significant cardiac history, DM 2, CKD3, COPD who presented with lightheadedness. Found to have inferior wall STEMI on admission.  Assessment & Plan   Second degree AV block, Type 1 The patient has second degree av block type 1 noted on telemetry and recent ekgs accompanied with bradycardia. Patient however remains asymptomatic. Will discuss with EP as to whether any intervention can be considered. Will monitor for another day.  Inferior wall STEMI s/p des stent : Patient without any new onset chest pain.    -contiunue imdur 15mg  qd -Aspirin 81mg  -Lipitor 80mg  qd  -Ticagrelor 90mg  bid -Cardiac rehab   COPD Acute respiratory distress   -Continue dulera -Continue montelukast 10mg  qhs  -Continue proventil q6hrs prn   OSA Patient to continue bipap nightly. Patient used it for 7.5hrs yesterday night.  Diabetes Mellitus type 2 The patient's blood glucose have ranged 114-160s.  -Continue lantus 25u qd  -Continue SSI -Added novolog 2u TIDWC  CKD3 Patient's cr=2.1 11/7, baseline 2-2.5.   Normocytic anemia Hb=7.8, hct=25, baseline 10-11. Ferritin=21,  Iron=21, tibc=230  Anemia likely due to combination of blood loss from procedure and anemia of chronic disease.   Lars Mage, MD Internal Medicine PGY2 [id number]:[phone] 04/25/2018, 8:45 AM   Patient seen and examined. Agree  with assessment and plan.  Recurrent chest pain.  Cardiac rehab to work  with patient today.  Telemetry shows episodes of bradycardia with Wenckebach second-degree Mobitz 1 block.  Patient is not on any rate control meds.  He is asymptomatic with bradycardia arrhythmia denies presyncope or awareness of palpitations.  Has been using BiPAP nightly with improved sleep. Cr Improved to 2.1 yesterday.  Isosorbide started for anti-ischemic benefit with significant concomitant CAD; titrate to 30 mg daily.  Possible DC tomorrow with consideration for outpatient monitor to make certain no other heart block or significant pauses develop.  Troy Sine, MD, [facility] 04/25/2018 11:23 AM

## 2018-04-25 NOTE — Progress Notes (Signed)
Bed alarm turned on and educated to call out before getting up.

## 2018-04-25 NOTE — Progress Notes (Signed)
CARDIAC REHAB PHASE I   PRE:  Rate/Rhythm: 74 1HB  BP:  Supine:   Sitting: 121/51  Standing:    SaO2: 97%RA  MODE:  Ambulation: 470 ft   POST:  Rate/Rhythm: 61-84 SR 1HB  BP:  Supine:   Sitting: 106/58  Standing:    SaO2: 99%RA 0825-0850 Pt walked 470 ft on RA with rolling walker independently with steady gait. Has walker at home. No c/o CP or dizziness or SOB. To recliner after walk. Set up breakfast.   Graylon Good, RN BSN  04/25/2018 8:45 AM

## 2018-04-26 ENCOUNTER — Other Ambulatory Visit: Payer: Self-pay | Admitting: Cardiology

## 2018-04-26 DIAGNOSIS — I441 Atrioventricular block, second degree: Secondary | ICD-10-CM

## 2018-04-26 LAB — BASIC METABOLIC PANEL
Anion gap: 7 (ref 5–15)
BUN: 49 mg/dL — ABNORMAL HIGH (ref 8–23)
CHLORIDE: 114 mmol/L — AB (ref 98–111)
CO2: 19 mmol/L — AB (ref 22–32)
CREATININE: 2.05 mg/dL — AB (ref 0.61–1.24)
Calcium: 8.1 mg/dL — ABNORMAL LOW (ref 8.9–10.3)
GFR calc non Af Amer: 27 mL/min — ABNORMAL LOW (ref >60)
GFR, EST AFRICAN AMERICAN: 32 mL/min — AB (ref >60)
Glucose, Bld: 85 mg/dL (ref 70–99)
POTASSIUM: 4.5 mmol/L (ref 3.5–5.1)
Sodium: 140 mmol/L (ref 135–145)

## 2018-04-26 LAB — GLUCOSE, CAPILLARY: Glucose-Capillary: 88 mg/dL (ref 70–99)

## 2018-04-26 MED ORDER — ATORVASTATIN CALCIUM 80 MG PO TABS: 80.0000 mg | ORAL_TABLET | Freq: Every day | ORAL | 4 refills | Status: DC

## 2018-04-26 MED ORDER — FAMOTIDINE 20 MG PO TABS: 20.0000 mg | ORAL_TABLET | Freq: Every day | ORAL | 4 refills | Status: DC

## 2018-04-26 MED ORDER — TICAGRELOR 90 MG PO TABS: 90.0000 mg | ORAL_TABLET | Freq: Two times a day (BID) | ORAL | 9 refills | Status: DC

## 2018-04-26 MED ORDER — ISOSORBIDE MONONITRATE ER 30 MG PO TB24: 30.0000 mg | ORAL_TABLET | Freq: Every day | ORAL | 11 refills | Status: DC

## 2018-04-26 MED ORDER — ASPIRIN 81 MG PO CHEW: 81.0000 mg | CHEWABLE_TABLET | Freq: Every day | ORAL | Status: DC

## 2018-04-26 MED ORDER — ASPIRIN 81 MG PO CHEW: 81.0000 mg | CHEWABLE_TABLET | Freq: Every day | ORAL | 3 refills | Status: DC

## 2018-04-26 MED ORDER — TICAGRELOR 90 MG PO TABS: 90.0000 mg | ORAL_TABLET | Freq: Two times a day (BID) | ORAL | 3 refills | Status: DC

## 2018-04-26 MED ORDER — ATORVASTATIN CALCIUM 80 MG PO TABS: 80.0000 mg | ORAL_TABLET | Freq: Every day | ORAL | 11 refills | Status: DC

## 2018-04-26 MED ORDER — ISOSORBIDE MONONITRATE ER 30 MG PO TB24: 30.0000 mg | ORAL_TABLET | Freq: Every day | ORAL | 4 refills | Status: DC

## 2018-04-26 NOTE — Progress Notes (Signed)
Patient resting comfortably during shift report. Denies complaints.  

## 2018-04-26 NOTE — Progress Notes (Signed)
Call placed to CCMD to notify of telemetry monitoring d/c.   

## 2018-04-26 NOTE — Progress Notes (Signed)
CARDIAC REHAB PHASE I   Pt just recently walked, did not want to walk again. Pt did have questions about Cardiac Rehab. Spoke with pt regarding program and how it worked. Pt was not sure if his daughter had his information, gave pt brochure to [place]. Pt had several questions about discharge. Informed pt that RN would speak with him and his daughter prior to discharge and give them discharge instructions. Pt did not have any more Cardiac Rehab questions and verbalized understanding.  [phone]  Carma Lair MS, ACSM CEP  9:37 AM 04/26/2018

## 2018-04-26 NOTE — Progress Notes (Signed)
Paged cardiology to change pharmacy Spoke to Jones PA Stated he will change

## 2018-04-26 NOTE — Progress Notes (Signed)
Progress Note  Patient Name: Clayton [name] Date of Encounter: 04/26/2018  Primary Cardiologist:   No primary care provider on file.   Subjective   No chest pain.  Walked.  Wants to go home.   Inpatient Medications    Scheduled Meds: . aspirin  81 mg Oral Daily  . atorvastatin  80 mg Oral q1800  . famotidine  20 mg Oral QHS  . ferrous sulfate  325 mg Oral Q breakfast  . heparin injection (subcutaneous)  5,000 Units Subcutaneous Q8H  . insulin aspart  0-15 Units Subcutaneous TID WC  . insulin aspart  0-5 Units Subcutaneous QHS  . insulin aspart  2 Units Subcutaneous TID WC  . insulin glargine  25 Units Subcutaneous Daily  . isosorbide mononitrate  30 mg Oral Daily  . Melatonin  3 mg Oral QHS  . mometasone-formoterol  2 puff Inhalation BID  . montelukast  10 mg Oral QHS  . sodium chloride flush  3 mL Intravenous Q12H  . ticagrelor  90 mg Oral BID   Continuous Infusions: . sodium chloride Stopped (04/23/18 1405)  . sodium chloride Stopped (04/22/18 1327)   PRN Meds: sodium chloride, acetaminophen, albuterol, ondansetron (ZOFRAN) IV, sodium chloride flush   Vital Signs    Vitals:   04/25/18 2012 04/26/18 0455 04/26/18 0613 04/26/18 0754  BP: (!) 116/57 (!) 120/54    Pulse: 70 64    Resp: 20 20    Temp: 97.6 F (36.4 C) (!) 97.5 F (36.4 C)    TempSrc: Oral Oral    SpO2: 99% 97%  95%  Weight:   90.1 kg   Height:        Intake/Output Summary (Last 24 hours) at 04/26/2018 0854 Last data filed at 04/26/2018 0700 Gross per 24 hour  Intake 480 ml  Output 1845 ml  Net -1365 ml   Filed Weights   04/24/18 0418 04/25/18 0446 04/26/18 0613  Weight: 91 kg 91 kg 90.1 kg    Telemetry    NSR, Mobitz type 1 and 2:1 block but no sustained arrhythmias - Personally Reviewed  ECG    NA - Personally Reviewed  Physical Exam   GEN: No acute distress.   Neck: No  JVD Cardiac: RRR, no murmurs, rubs, or gallops.  Respiratory: Clear  to auscultation bilaterally. GI:  Soft, nontender, non-distended  MS: No  edema; No deformity. Neuro:  Nonfocal  Psych: Normal affect   Labs    Chemistry Recent Labs  Lab 04/20/18 1533  04/23/18 0354 04/24/18 0423 04/26/18 0430  NA 141   < > 140 139 140  K 3.6   < > 4.2 4.1 4.5  CL 107   < > 115* 116* 114*  CO2 23   < > 22 19* 19*  GLUCOSE 201*   < > 139* 165* 85  BUN 51*   < > 56* 51* 49*  CREATININE 2.63*   < > 2.30* 2.10* 2.05*  CALCIUM 9.4   < > 8.3* 8.4* 8.1*  PROT 6.9  --   --   --   --   ALBUMIN 3.8  --   --   --   --   AST 39  --   --   --   --   ALT 15  --   --   --   --   ALKPHOS 60  --   --   --   --   BILITOT 0.6  --   --   --   --  GFRNONAA 20*   < > 24* 26* 27*  GFRAA 23*   < > 28* 31* 32*  ANIONGAP 11   < > 3* 4* 7   < > = values in this interval not displayed.     Hematology Recent Labs  Lab 04/22/18 0219 04/23/18 0354 04/24/18 0423  WBC 12.8* 11.0* 10.1  RBC 2.64* 2.65* 2.52*  HGB 8.1* 8.3* 7.8*  HCT 25.7* 26.3* 25.0*  MCV 97.3 99.2 99.2  MCH 30.7 31.3 31.0  MCHC 31.5 31.6 31.2  RDW 13.2 13.5 13.5  PLT 204 185 173    Cardiac Enzymes Recent Labs  Lab 04/20/18 1533 04/20/18 1832 04/21/18 0030 04/21/18 0557  TROPONINI 1.04* 11.34* 46.23* 62.25*   No results for input(s): TROPIPOC in the last 168 hours.   BNPNo results for input(s): BNP, PROBNP in the last 168 hours.   DDimer No results for input(s): DDIMER in the last 168 hours.   Radiology    No results found.  Cardiac Studies   Echo Study Conclusions  - Left ventricle: The cavity size was normal. Wall thickness was   increased in a pattern of mild LVH. Systolic function was normal.   The estimated ejection fraction was in the range of 50% to 55%.   Posterior wall hypokinesis. The study is not technically   sufficient to allow evaluation of LV diastolic function. - Mitral valve: Mildly thickened leaflets . There was trivial   regurgitation. - Left atrium: The atrium was mildly dilated. - Right  ventricle: The cavity size was moderately dilated. Severe   hypokinesis. - Tricuspid valve: There was trivial regurgitation. - Pulmonary arteries: PA peak pressure: 8 mm Hg (S). - Inferior vena cava: The vessel was dilated. The respirophasic   diameter changes were blunted (< 50%), consistent with elevated   central venous pressure. Temporary pacer wire noted in the IVC.   Patient Profile     84 y.o. male with no significant cardiac history, DM 2, CKD3, COPD who presented with lightheadedness. Found to have inferior wall STEMI on admission.  Assessment & Plan    Second degree AV block:   Will need 24 - 48 hour Holter at the follow up.  Acute inferior STEMI:   PCI of RCA.  High grade circ disease managed medically.   OK to discharge on meds as listed  COPD:  Continue current therapy.   OSA:  No change in therapy.    DM:   Continue current therapy.   CKD III:  Creat is stable.  Follow up at [facility] appt.    Anemia:  He will need to have this followed as an out patient.  Send home on iron supplement.     For questions or updates, please contact [place] Please consult [email or web address] for contact info under Cardiology/STEMI.   Signed, Minus Breeding, MD  04/26/2018, 8:54 AM

## 2018-04-26 NOTE — Discharge Summary (Addendum)
Discharge Summary    Patient ID: Clayton [name] MRN: [id number]; DOB: 10-Jan-1928  Admit date: 04/20/2018 Discharge date: 04/26/2018  Primary Care Provider: Lujean Amel, MD  Primary Cardiologist: Glenetta Hew, MD   Discharge Diagnoses    Principal Problem:   Acute ST elevation myocardial infarction (STEMI) of inferior wall ([place]) Active Problems:   OSA treated with BiPAP   Symptomatic bradycardia   AV heart block   Coronary artery disease   Anemia   Chronic obstructive pulmonary disease ([place])   Rhonchi at both lung bases  Allergies Allergies  Allergen Reactions  . Doxycycline Rash  . Levaquin [Levofloxacin In D5w]     Caused chest pain and heartburn  . Nitrous Oxide Other (See Comments)    Reaction:  Unknown   . Penicillins Other (See Comments)    Reaction:  Unknown Has patient had a PCN reaction causing immediate rash, facial/tongue/throat swelling, SOB or lightheadedness with hypotension: Unsure Has patient had a PCN reaction causing severe rash involving mucus membranes or skin necrosis: Unsure Has patient had a PCN reaction that required hospitalization Unsure  Has patient had a PCN reaction occurring within the last 10 years: No If all of the above answers are "NO", then may proceed with Cephalosporin use.  . Tape Other (See Comments)    Reaction:  Tears pts skin   . Diltiazem Rash    Diagnostic Studies/Procedures    04/21/2018 ECHOStudy Conclusions  - Left ventricle: The cavity size was normal. Wall thickness was increased in a pattern of mild LVH. Systolic function was normal. The estimated ejection fraction was in the range of 50% to 55%. Posterior wall hypokinesis. The study is not technically sufficient to allow evaluation of LV diastolic function. - Mitral valve: Mildly thickened leaflets . There was trivial regurgitation. - Left atrium: The atrium was mildly dilated. - Right ventricle: The cavity size was moderately dilated.  Severe hypokinesis. - Tricuspid valve: There was trivial regurgitation.  - Pulmonary arteries: PA peak pressure: 8 mm Hg (S). - Inferior vena cava: The vessel was dilated. The respirophasic diameter changes were blunted (<50%), consistent with elevated central venous pressure. Temporary pacer wire noted in the IVC.  Impressions:  - LVEF 50-55%, mild LVH, posterior wall hypokinesis with severe RV hypokinesis, suggestive of RV infarct, trivial MR, mild LAE, trivial TR, low right heart pressure with dilated IVC and temporary pacer wire noted in the IVC. Echo suggests patient may need volume if hypotensive due to RV dysfunction.  Cardiac catheterization 05/17/2018:   CULPRIT LESION: mid RCA-1 lesion is 100% stenosed. (Prox RCA lesion is 55% stenosed tapered to occlusion, Mid RCA-2 lesion is 80% stenosed distal to occlusion)  A drug-eluting stent was successfully placed covering the upstream tapered segment and the downstream lesion, using a STENT SYNERGY DES 3X38. Stent postdilated to 3.6 mm  Post intervention, there is a 0% residual stenosis.  -----------RESIDUAL DISEASE---------------------------------  Mid LM to Ost LAD lesion is 40% stenosed with 85% stenosed side branch in Ost Cx.  Ost Cx to Prox Cx lesion is 65% stenosed with 85% stenosed side branch in Ost 1st Mrg.  Prox LAD-1 lesion is 60% stenosed -just beyond 1st Diag, followed by Prox LAD-2 lesion is 50% stenosed.  Dist LAD lesion is 75% stenosed.  Ost 1st Diag-1 lesion is 65% stenosed followed by focal 90% stenosis at takeoff of small branch.  -----------------------------------------------------------------  LV end diastolic pressure is mildly elevated.   SUMMARY  Severe multivessel disease with 100% proximal RCA occlusion  with 90% ostial circumflex (90% ostial OM1) and 60% mid LAD. ? I discussed findings with Dr. Cyndia Bent on telephone with concern for possible need for CABG-->with renal  insufficiency and diabetes and multivessel disease, he felt that the patient would not be a good candidate to take to the OR in the urgent situation. He recommended PCI as best possible.  Mild Cardiogenic Shock-initially requiring low-dose levo fed, but during the procedure his pressures did go down. But now stabilized on very low-dose Levophed --->being used as much for rate control as blood pressure.  Successful DES PCI of a long lesion in the dominant RCA (synergy 3.0 mm 38 mm postdilated to 3.5-3.6 mm)  Temporary wire placement for symptom medic bradycardia cannot exclude 2: 1 AV block.  RECOMMENDATIONS Admit to inpatient-Continue temporary wire overnight to ensure stable heart rhythm.  Continue IV Levophed infusion to maintain systolic blood pressures greater than 95 and map greater than 65.  Restart IV heparin 8 hours after the radial sheath removal  Convert from simvastatin to high-dose atorvastatin  Restart Lantus with sliding scale insulin  Recommend uninterrupted dual antiplatelet therapy. Need to review Cath Lab images of the ostial circumflex flex lesion with interventional colleagues to determine if there is any PCI options. Recommendation at this time would be to treat medically and reassess for symptoms. If symptomatic 1 to 2 months post MI, would consider the possibility of high risk PCI versus CABG.  History of Present Illness     84 y.o. male with no significant cardiac history, DM 2, CKD3, COPD who presented with lightheadedness found to have inferior wall STEMI on admission 04/20/18.  Hospital Course     Clayton [name] is an 84 year old gentleman with history as stated above but no active cardiac history besides first-degree AV block who was well until roughly 5:30am on morning of admission, 04/20/2018. He that he did not feel well and became dizzy and lightheaded but without nausea. He denied chest pain however felt mildly short of breath. given that his symptoms  did not improve he decided go to urgent care. Upon arrival at urgent care he was given a tentative diagnosis of pneumonia and sent to [facility] emergency room.  EMS on transport thought they saw inferior ST elevations and followed serial EKGs. Upon arrival to [facility] ER parking area, code STEMI was called for roughly 2 mm ST elevations in II, III and aVF.  ST depressions in ruminal 1 and aVL.  He was taken urgently to the cardiac Cath Lab which revealed a mid RCA lesion which was 100% stenosed with a successful DES stent placement.  There was noted to be residual disease in mid LM to the ostial LAD, ostial CX to proximal CX, proximal LAD, distal LAD and ostial first diagonal.  Per cath report, he was found to have severe multivessel disease of the 100% proximal RCA occlusion with 90% ostial circumflex and 60% mid LAD.  Findings were discussed with Dr. Cyndia Bent per telephone encounter with concern for possible need for CABG however with renal insufficiency and diabetes and multivessel disease he felt that the patient would not be a good candidate to take to the OR in an urgent situation and recommended PCI as best possible option.  He had mild cardiogenic shock in the Cath Lab and was placed briefly on low-dose Levophed.  Recommendations post cath were for uninterrupted dual antiplatelet therapy for a minimum of 12 months duration.  Recommendations for residual disease are to treat medically and reassess  for symptoms.  If symptomatic in 1 to 2 months post MI, could be considered for possibility of high risk PCI versus CABG.  Other hospital problems include:  Second degree AV block, Type 1 -Second degree AV block type 1 noted on telemetry and recent EKG accompanied with bradycardia. Patient remains asymptomatic. Will discuss with EP as to whether any intervention can be considered.  -Given the above, patient ordered outpatient 24-48H monitor fitting to make certain no other heart block or significant pauses  develop -Message sent for office to call with schedule date and time of appointment at discharge.  Inferior wall STEMI s/p des stent (Day 3):  -Normal sinus rhythm without any st or t wave changes on ekg 04/24/18.Pt without new chest pain.   Ambulated with cardiac rehab and was able to ambulate 465ft without any chest pain.  -Contiunue Imdur 30mg  qd -Aspirin 81mg  -Lipitor 80mg  qd  -Ticagrelor 90mg  bid -Cardiac rehab to follow  COPD with acute respiratory distress  -Chest xray 11/5 shows atelectasis in right lung base.  -Continue dulera -Continue montelukast 10mg  qhs  -Continue proventil q6hrs prn   OSA -Patient to continue bipap nightly  Diabetes Mellitus type 2 -Continue lantus 25u qd  -Continue SSI -Added novolog 2u TIDWC  CKD3 -Cr=2.05 on day of discharge with baseline of 2-2.5 -Follow with OP BMET  Normocytic anemia -Hb=7.8, Hct=25, baseline 10-11. Ferritin=21,  Iron=21, tibc=230 on 04/24/18 -Patient discharged on iron supplementation will need to follow with PCP  Hyperlipidemia -LDL 62, now on atorvastatin 80 mg which replaced simvastatin  Consultants: None  The patient was seen and examined by Dr. Percival Spanish who feels that he is stable and ready for discharge on 04/26/2018.  Patient has ambulated with cardiac rehab without complication.  See problems and plans as above _____________  Discharge Vitals Blood pressure (!) 120/54, pulse 64, temperature (!) 97.5 F (36.4 C), temperature source Oral, resp. rate 20, height 5\' 10"  (1.778 m), weight 90.1 kg, SpO2 95 %.  Filed Weights   04/24/18 0418 04/25/18 0446 04/26/18 0613  Weight: 91 kg 91 kg 90.1 kg   Labs & Radiologic Studies    CBC Recent Labs    04/24/18 0423  WBC 10.1  HGB 7.8*  HCT 25.0*  MCV 99.2  PLT 161   Basic Metabolic Panel Recent Labs    04/24/18 0423 04/26/18 0430  NA 139 140  K 4.1 4.5  CL 116* 114*  CO2 19* 19*  GLUCOSE 165* 85  BUN 51* 49*  CREATININE 2.10* 2.05*  CALCIUM  8.4* 8.1*   ____________  Dg Chest Port 1 View  Result Date: 04/22/2018 CLINICAL DATA:  Rhonchi at the lung bases. EXAM: PORTABLE CHEST 1 VIEW COMPARISON:  04/20/2018 and 08/07/2016 and chest CT dated 12/08/2015 FINDINGS: Heart size and pulmonary vascularity are normal. Stable chronic scarring at the left lung base. Tiny area of atelectasis at the right lung base medially is new. No discrete effusions.  No acute bone abnormality. IMPRESSION: Tiny area of atelectasis at the right lung base, new since the prior study. Stable scarring at the left lung base. Electronically Signed   By: Lorriane Shire M.D.   On: 04/22/2018 09:19   Dg Chest Port 1 View  Result Date: 04/20/2018 CLINICAL DATA:  STEMI EXAM: PORTABLE CHEST 1 VIEW COMPARISON:  08/07/2016 FINDINGS: Cardiac shadows within normal limits but accentuated by the portable technique. The overall inspiratory effort is poor. Chronic blunting of left costophrenic angle is noted. No focal infiltrate or sizable  effusion is seen. No bony abnormality is noted. IMPRESSION: Chronic changes in the left base.  No acute abnormality noted. Electronically Signed   By: Inez Catalina M.D.   On: 04/20/2018 15:51   Disposition   Pt is being discharged home today in good condition.  Follow-up Plans & Appointments    Follow-up Information    Leonie Man, MD Follow up.   Specialty:  Cardiology Why:  Our office will contact you with date and time of follow-up appointment.  You will also need an appointment for monitor fitting and placement. Contact information: [address] [place] [address] [place] [address] [phone]          Discharge Instructions    Amb Referral to Cardiac Rehabilitation   Complete by:  As directed    Diagnosis:   STEMI Coronary Stents     Call MD for:  difficulty breathing, headache or visual disturbances   Complete by:  As directed    Call MD for:  extreme fatigue   Complete by:  As directed    Call MD for:  hives    Complete by:  As directed    Call MD for:  persistant dizziness or light-headedness   Complete by:  As directed    Call MD for:  persistant nausea and vomiting   Complete by:  As directed    Call MD for:  redness, tenderness, or signs of infection (pain, swelling, redness, odor or green/yellow discharge around incision site)   Complete by:  As directed    Call MD for:  severe uncontrolled pain   Complete by:  As directed    Call MD for:  temperature >100.4   Complete by:  As directed    Diet - low sodium heart healthy   Complete by:  As directed    Discharge instructions   Complete by:  As directed    PLEASE DO NOT [place]!!!!! Also keep a log of you blood pressures and bring back to your follow up appt. Please call the office with any questions.   Patients taking blood thinners should generally stay away from medicines like ibuprofen, Advil, Motrin, naproxen, and Aleve due to risk of stomach bleeding. You may take Tylenol as directed or talk to your primary doctor about alternatives.  Some studies suggest Prilosec/Omeprazole interacts with Plavix. We changed your Prilosec/Omeprazole to the equivalent dose of Pepcid for less chance of interaction.   No driving for 3 days. No lifting over 5 lbs for 1 week. No sexual activity for 1 week. Keep procedure site clean & dry. If you notice increased pain, swelling, bleeding or pus, call/return!  You may shower, but no soaking baths/hot tubs/pools for 1 week.   Increase activity slowly   Complete by:  As directed      Discharge Medications   Allergies as of 04/26/2018      Reactions   Doxycycline Rash   Levaquin [levofloxacin In D5w]    Caused chest pain and heartburn   Nitrous Oxide Other (See Comments)   Reaction:  Unknown    Penicillins Other (See Comments)   Reaction:  Unknown Has patient had a PCN reaction causing immediate rash, facial/tongue/throat swelling, SOB or lightheadedness with hypotension:  Unsure Has patient had a PCN reaction causing severe rash involving mucus membranes or skin necrosis: Unsure Has patient had a PCN reaction that required hospitalization Unsure  Has patient had a PCN reaction occurring within the last 10 years:  No If all of the above answers are "NO", then may proceed with Cephalosporin use.   Tape Other (See Comments)   Reaction:  Tears pts skin    Diltiazem Rash      Medication List    STOP taking these medications   omeprazole 20 MG capsule Commonly known as:  PRILOSEC   simvastatin 20 MG tablet Commonly known as:  ZOCOR     TAKE these medications   ACCU-CHEK SOFTCLIX LANCETS lancets TEST AS INSTRUCTED TO OBTAIN A BLOOD SPECIMEN 4 TIMES DAILY   albuterol 108 (90 Base) MCG/ACT inhaler Commonly known as:  PROVENTIL HFA;VENTOLIN HFA Inhale 1-2 puffs into the lungs every 6 (six) hours as needed for wheezing or shortness of breath.   aspirin 81 MG chewable tablet Chew 1 tablet (81 mg total) by mouth daily. Start taking on:  04/27/2018   atorvastatin 80 MG tablet Commonly known as:  LIPITOR Take 1 tablet (80 mg total) by mouth daily.   B-D SINGLE USE SWABS REGULAR Pads USE 7 SWABS DAILY AS DIRECTED   budesonide-formoterol 160-4.5 MCG/ACT inhaler Commonly known as:  SYMBICORT Inhale 2 puffs into the lungs 2 (two) times daily.   cholecalciferol 1000 units tablet Commonly known as:  VITAMIN D Take 2,000 Units by mouth daily.   famotidine 20 MG tablet Commonly known as:  PEPCID Take 1 tablet (20 mg total) by mouth at bedtime.   ferrous sulfate 325 (65 FE) MG tablet Take 325 mg by mouth daily with breakfast.   finasteride 5 MG tablet Commonly known as:  PROSCAR TAKE 1 TABLET (5 MG TOTAL) BY MOUTH DAILY. What changed:  See the new instructions.   furosemide 20 MG tablet Commonly known as:  LASIX Take 1 tablet (20 mg total) by mouth daily. Take 2 tabs 5 times weekly.   glucose blood test strip 1 each by Other route daily. Use as  instructed to check blood sugar 4 times daily.   insulin aspart 100 UNIT/ML injection Commonly known as:  novoLOG TAKE 8 UNITS UNDER THE SKIN DAILY Monday-Friday AND ON Saturday AND Sunday TAKE 6 UNITS AT BREAKFAST AND LUNCH, AND 8 UNITS AT DINNER.   insulin glargine 100 UNIT/ML injection Commonly known as:  LANTUS INJECT 24 UNITS SUBCUTANEOUSLY IN THE MORNING AND 20 UNITS IN THE EVENING What changed:    how much to take  how to take this  when to take this  additional instructions   Insulin Syringe-Needle U-100 31G X 5/16" 0.3 ML Misc USE THREE TIMES DAILY   isosorbide mononitrate 30 MG 24 hr tablet Commonly known as:  IMDUR Take 1 tablet (30 mg total) by mouth daily. Start taking on:  04/27/2018   multivitamin with minerals Tabs tablet Take 1 tablet by mouth daily.   ticagrelor 90 MG Tabs tablet Commonly known as:  BRILINTA Take 1 tablet (90 mg total) by mouth 2 (two) times daily.        Acute coronary syndrome (MI, NSTEMI, STEMI, etc) this admission?: Yes.     AHA/ACC Clinical Performance & Quality Measures: 1. Aspirin prescribed? - Yes 2. ADP Receptor Inhibitor (Plavix/Clopidogrel, Brilinta/Ticagrelor or Effient/Prasugrel) prescribed (includes medically managed patients)? - Yes 3. Beta Blocker prescribed? - No - AV block and bradycardia  4. High Intensity Statin (Lipitor 40-80mg  or Crestor 20-40mg ) prescribed? - Yes 5. EF assessed during THIS hospitalization? - Yes 6. For EF <40%, was ACEI/ARB prescribed? - Not Applicable (EF >/= 30%) 7. For EF <40%, Aldosterone Antagonist (Spironolactone or Eplerenone) prescribed? -  Not Applicable (EF >/= 21%) 8. Cardiac Rehab Phase II ordered (Included Medically managed Patients)? - Yes   Outstanding Labs/Studies   Follow-up TOC appointment with BMET  CBC to reassess anemia, PCP  Duration of Discharge Encounter   Greater than 30 minutes including physician time.  Signed, Kathyrn Drown, NP 04/26/2018, 10:40  AM

## 2018-04-26 NOTE — Progress Notes (Signed)
Page to Cards PA/NP to notify of pharmacy preference, due to [facility] closed on weekends and inability to fill cardiac meds otherwise

## 2018-04-28 LAB — GLUCOSE, CAPILLARY: Glucose-Capillary: 118 mg/dL — ABNORMAL HIGH (ref 70–99)

## 2018-04-29 ENCOUNTER — Telehealth (HOSPITAL_COMMUNITY): Payer: Self-pay

## 2018-04-29 ENCOUNTER — Telehealth: Payer: Self-pay | Admitting: Cardiology

## 2018-04-29 NOTE — Telephone Encounter (Signed)
Made initial call to pt in regards to Cardiac Rehab - pt is interested in the program. Explained scheduling process and went over insurance with patient, he verbalized understanding. Will contact pt for scheduling once f/u appt has been completed.

## 2018-04-29 NOTE — Telephone Encounter (Signed)
Pt's insurance is active and benefits verified through [facility]  $15.00 Co-pay, deductible amount of $0.00/$0.00, out of pocket amount $6,700/$503.33, no co-insurance, and is pre-authorization no required. Passport/reference # - [id number]  Will make initial call to pt in regards to Cardiac Rehab. If interested, pt will need to complete follow up appt. Once completed, pt will be contacted for scheduling.

## 2018-04-29 NOTE — Telephone Encounter (Signed)
No message needed °

## 2018-05-08 ENCOUNTER — Ambulatory Visit: Payer: Medicare PPO | Admitting: Adult Health

## 2018-05-08 ENCOUNTER — Encounter: Payer: Self-pay | Admitting: Adult Health

## 2018-05-08 VITALS — BP 138/72 | HR 105 | Resp 16 | Ht 70.0 in | Wt 191.0 lb

## 2018-05-08 DIAGNOSIS — E7800 Pure hypercholesterolemia, unspecified: Secondary | ICD-10-CM

## 2018-05-08 DIAGNOSIS — I10 Essential (primary) hypertension: Secondary | ICD-10-CM | POA: Diagnosis not present

## 2018-05-08 DIAGNOSIS — I25118 Atherosclerotic heart disease of native coronary artery with other forms of angina pectoris: Secondary | ICD-10-CM | POA: Diagnosis not present

## 2018-05-08 DIAGNOSIS — R001 Bradycardia, unspecified: Secondary | ICD-10-CM

## 2018-05-08 DIAGNOSIS — Z79899 Other long term (current) drug therapy: Secondary | ICD-10-CM | POA: Diagnosis not present

## 2018-05-08 NOTE — Patient Instructions (Signed)
Medication Instructions:  NO CHANGES- Your physician recommends that you continue on your current medications as directed. Please refer to the Current Medication list given to you today.  If you need a refill on your cardiac medications before your next appointment, please call your pharmacy.  Labwork: LFT AND LIPID 3 DAYS BEFORE FOLLOW UP APPOINTMENT HERE IN OUR OFFICE AT [facility]  Take the provided lab slips with you to the lab for your blood draw.   You will need to fast. DO NOT EAT OR DRINK PAST MIDNIGHT.   If you have labs (blood work) drawn today and your tests are completely normal, you will receive your results only by: Marland Kitchen MyChart Message (if you have MyChart) OR . A paper copy in the mail If you have any lab test that is abnormal or we need to change your treatment, we will call you to review the results.  Special Instructions: REFERRAL TO CARDIAC REHAB-THEY WILL CALL YOU TO SCHEDULE APPOINTMENT.  MAKE SURE TO KEEP YOUR SCHEDULED MONITOR APPOINTMENT  SHOULD YOU HAVE ANY CHEST PAIN/DISCOMFORT PLEASE GIVE [place] A CALL SO WE CAN TREAT.  Follow-Up: You will need a follow up appointment in 3 MONTHS.   You may see  DR Cecil Cranker, DNP, AACC or one of the following Advanced Practice Providers on your designated Care Team:    . Jory Sims, DNP, ANP . Rosaria Ferries, PA-C  At [facility], you and your health needs are our priority.  As part of our continuing mission to provide you with exceptional heart care, we have created designated Provider Care Teams.  These Care Teams include your primary Cardiologist (physician) and Advanced Practice Providers (APPs -  Physician Assistants and Nurse Practitioners) who all work together to provide you with the care you need, when you need it.  Thank you for choosing [facility] at [facility]!!

## 2018-05-08 NOTE — Progress Notes (Signed)
Cardiology Office Note   Date:  05/08/2018   ID:  Clayton [name], DOB [birth year], MRN [id number]  PCP:  Lujean Amel, MD  Cardiologist:  Dr. Ellyn Hack   Chief Complaint  Patient presents with  . Bradycardia  . Hospitalization Follow-up    Inferior MI with RCA stenting   . Coronary Artery Disease     History of Present Illness: Clayton [name] is a 84 y.o. male who presents for post hospital follow up after admission for inferior ST elevation MI, with symptoms of dyspnea thought initially to be pneumonia. On transport to ER for further treatment EKG revealed inferior ST elevation. He was taken urgently to cardiac cath lab and was found to have 100% occluded mid RCA lesion, with successful DES stent placement. There was found to be residual disease in the mid LM to the ostial LAD, ostial Cx to proximal Cx, proximal LAD distal LAD, and ostial first diagonal.   He was found to have second degree AV block with bradycardia, EP make need to see him if HR remained low and he was symptomatic. He was started on DAPT with Brilinta and ASA, continued on high dose statin. He was to continue Imdur as well. He has OSA and was to continue on BiPAP. He needs a BMET today.   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 He also went to the [facility] several times a week until he began to have symptoms.   He comes today with his daughter on follow up and has multiple questions concerning his cath results, location of his stent, his medications, and lifestyle questions. He has not yet started cardiac rehab. He denies excessive bleeding or bruising, DOE, or recurrent chest pain. He wants to [facility] back [facility].  Past Medical History:  Diagnosis Date  . Anemia   . Chronic renal insufficiency   . DDD (degenerative disc disease), lumbar   . Diabetes mellitus   . Hypertension   . Neuromuscular disorder ([place])   . Osteoporosis     Past Surgical History:  Procedure Laterality  Date  . CORONARY STENT INTERVENTION N/A 04/20/2018   Procedure: CORONARY STENT INTERVENTION;  Surgeon: Leonie Man, MD;  Location: [place] CV LAB;  Service: Cardiovascular;  Laterality: N/A;  . CORONARY/GRAFT ACUTE MI REVASCULARIZATION N/A 04/20/2018   Procedure: Coronary/Graft Acute MI Revascularization;  Surgeon: Leonie Man, MD;  Location: [place] [facility];  Service: Cardiovascular;  Laterality: N/A;  . EYE SURGERY    . I&D EXTREMITY Right 08/13/2016   Procedure: IRRIGATION AND DEBRIDEMENT RIGHT ELBOW AND HAND;  Surgeon: Milly Jakob, MD;  Location: [facility];  Service: Orthopedics;  Laterality: Right;  . LEFT HEART CATH AND CORONARY ANGIOGRAPHY N/A 04/20/2018   Procedure: LEFT HEART CATH AND CORONARY ANGIOGRAPHY;  Surgeon: Leonie Man, MD;  Location: [place] [facility];  Service: Cardiovascular;  Laterality: N/A;  . VASECTOMY    . VIDEO BRONCHOSCOPY Bilateral 08/25/2012   Procedure: VIDEO BRONCHOSCOPY WITHOUT FLUORO;  Surgeon: Brand Males, MD;  Location: [place];  Service: Cardiopulmonary;  Laterality: Bilateral;     Current Outpatient Medications  Medication Sig Dispense Refill  . ACCU-CHEK SOFTCLIX LANCETS lancets TEST AS INSTRUCTED TO OBTAIN A BLOOD SPECIMEN 4 TIMES DAILY 400 each 1  . albuterol (PROVENTIL HFA;VENTOLIN HFA) 108 (90 Base) MCG/ACT inhaler Inhale 1-2 puffs into the lungs every 6 (six) hours as needed for wheezing or shortness of breath.    Marland Kitchen  Alcohol Swabs (B-D SINGLE USE SWABS REGULAR) PADS USE 7 SWABS DAILY AS DIRECTED 600 each 0  . aspirin 81 MG chewable tablet Chew 1 tablet (81 mg total) by mouth daily. 90 tablet 3  . atorvastatin (LIPITOR) 80 MG tablet Take 1 tablet (80 mg total) by mouth daily. 30 tablet 11  . budesonide-formoterol (SYMBICORT) 160-4.5 MCG/ACT inhaler Inhale 2 puffs into the lungs 2 (two) times daily. 10.2 g 5  . cholecalciferol (VITAMIN D) 1000 units tablet Take 2,000 Units by mouth daily.    . famotidine (PEPCID) 20 MG  tablet Take 1 tablet (20 mg total) by mouth at bedtime. 90 tablet 4  . ferrous sulfate 325 (65 FE) MG tablet Take 325 mg by mouth daily with breakfast.    . fesoterodine (TOVIAZ) 4 MG TB24 tablet Take 4 mg by mouth daily.    . finasteride (PROSCAR) 5 MG tablet TAKE 1 TABLET (5 MG TOTAL) BY MOUTH DAILY. (Patient taking differently: Take 5 mg by mouth daily. ) 90 tablet 1  . furosemide (LASIX) 20 MG tablet Take 1 tablet (20 mg total) by mouth daily. Take 2 tabs 5 times weekly. 30 tablet 3  . glucose blood (ACCU-CHEK AVIVA PLUS) test strip 1 each by Other route daily. Use as instructed to check blood sugar 4 times daily. 400 each 3  . insulin aspart (NOVOLOG) 100 UNIT/ML injection TAKE 8 UNITS UNDER THE SKIN DAILY Monday-Friday AND ON Saturday AND Sunday TAKE 6 UNITS AT BREAKFAST AND LUNCH, AND 8 UNITS AT DINNER. 50 mL 2  . insulin glargine (LANTUS) 100 UNIT/ML injection INJECT 24 UNITS SUBCUTANEOUSLY IN THE MORNING AND 20 UNITS IN THE EVENING (Patient taking differently: Inject 24 Units into the skin every morning. ) 20 mL 0  . Insulin Syringe-Needle U-100 (BD INSULIN SYRINGE U/F) 31G X 5/16" 0.3 ML MISC USE THREE TIMES DAILY 270 each 1  . isosorbide mononitrate (IMDUR) 30 MG 24 hr tablet Take 1 tablet (30 mg total) by mouth daily. 30 tablet 11  . Melatonin 5 MG TABS Take 1 tablet by mouth at bedtime.    . Multiple Vitamin (MULTIVITAMIN WITH MINERALS) TABS tablet Take 1 tablet by mouth daily.    Marland Kitchen omeprazole (PRILOSEC) 20 MG capsule Take 20 mg by mouth daily.    . ticagrelor (BRILINTA) 90 MG TABS tablet Take 1 tablet (90 mg total) by mouth 2 (two) times daily. 180 tablet 3   No current facility-administered medications for this visit.     Allergies:   Doxycycline; Levaquin [levofloxacin in d5w]; Nitrous oxide; Penicillins; Tape; and Diltiazem    Social History:  The patient  reports that he quit smoking about 44 years ago. His smoking use included cigarettes. He has a 99.00 pack-year smoking  history. He quit smokeless tobacco use about 42 years ago. He reports that he does not drink alcohol or use drugs.   Family History:  The patient's family history includes Diabetes Mellitus II in his unknown relative; Hypertension in his unknown relative.    ROS: All other systems are reviewed and negative. Unless otherwise mentioned in H&P    PHYSICAL EXAM: VS:  BP 138/72   Pulse (!) 105   Resp 16   Ht 5\' 10"  (1.778 m)   Wt 191 lb (86.6 kg)   SpO2 99%   BMI 27.41 kg/m  , BMI Body mass index is 27.41 kg/m. GEN: Well nourished, well developed, in no acute distress HEENT: normal Neck: no JVD, carotid bruits, or masses Cardiac:  RRR; no murmurs, rubs, or gallops,no edema  Respiratory:  Some bibasilar crackles.  GI: soft, nontender, nondistended, + BS MS: no deformity or atrophy Skin: warm and dry, no rash Neuro:  Strength and sensation are intact Psych: euthymic mood, full affect   EKG:  NSR rate of 88 bpm. With inferior Q-waves.  Recent Labs: 02/07/2018: TSH 2.60 04/20/2018: ALT 15 04/24/2018: Hemoglobin 7.8; Platelets 173 04/26/2018: BUN 49; Creatinine, Ser 2.05; Potassium 4.5; Sodium 140    Lipid Panel    Component Value Date/Time   CHOL 121 04/20/2018 1533   TRIG 111 04/20/2018 1533   HDL 37 (L) 04/20/2018 1533   CHOLHDL 3.3 04/20/2018 1533   VLDL 22 04/20/2018 1533   LDLCALC 62 04/20/2018 1533      Wt Readings from Last 3 Encounters:  05/08/18 191 lb (86.6 kg)  04/26/18 198 lb 11.2 oz (90.1 kg)  02/12/18 196 lb 12.8 oz (89.3 kg)      Other studies Reviewed: 04/21/2018 ECHOStudy Conclusions  - Left ventricle: The cavity size was normal. Wall thickness was increased in a pattern of mild LVH. Systolic function was normal. The estimated ejection fraction was in the range of 50% to 55%. Posterior wall hypokinesis. The study is not technically sufficient to allow evaluation of LV diastolic function. - Mitral valve: Mildly thickened leaflets . There  was trivial regurgitation. - Left atrium: The atrium was mildly dilated. - Right ventricle: The cavity size was moderately dilated. Severe hypokinesis. - Tricuspid valve: There was trivial regurgitation.  - Pulmonary arteries: PA peak pressure: 8 mm Hg (S). - Inferior vena cava: The vessel was dilated. The respirophasic diameter changes were blunted (<50%), consistent with elevated central venous pressure. Temporary pacer wire noted in the IVC.  Impressions:  - LVEF 50-55%, mild LVH, posterior wall hypokinesis with severe RV hypokinesis, suggestive of RV infarct, trivial MR, mild LAE, trivial TR, low right heart pressure with dilated IVC and temporary pacer wire noted in the IVC. Echo suggests patient may need volume if hypotensive due to RV dysfunction.  Cardiac catheterization 05/17/2018:   CULPRIT LESION: mid RCA-1 lesion is 100% stenosed. (Prox RCA lesion is 55% stenosed tapered to occlusion, Mid RCA-2 lesion is 80% stenosed distal to occlusion)  A drug-eluting stent was successfully placed covering the upstream tapered segment and the downstream lesion, using a STENT SYNERGY DES 3X38. Stent postdilated to 3.6 mm  Post intervention, there is a 0% residual stenosis.  -----------RESIDUAL DISEASE---------------------------------  Mid LM to Ost LAD lesion is 40% stenosed with 85% stenosed side branch in Ost Cx.  Ost Cx to Prox Cx lesion is 65% stenosed with 85% stenosed side branch in Ost 1st Mrg.  Prox LAD-1 lesion is 60% stenosed -just beyond 1st Diag, followed by Prox LAD-2 lesion is 50% stenosed.  Dist LAD lesion is 75% stenosed.  Ost 1st Diag-1 lesion is 65% stenosed followed by focal 90% stenosis at takeoff of small branch.  -----------------------------------------------------------------  LV end diastolic pressure is mildly elevated.  SUMMARY  Severe multivessel disease with 100% proximal RCA occlusion with 90% ostial circumflex (90% ostial  OM1) and 60% mid LAD. ? I discussed findings with Dr. Cyndia Bent on telephone with concern for possible need for CABG-->with renal insufficiency and diabetes and multivessel disease, he felt that the patient would not be a good candidate to take to the OR in the urgent situation. He recommended PCI as best possible.  Mild Cardiogenic Shock-initially requiring low-dose levo fed, but during the procedure his pressures did  go down. But now stabilized on very low-dose Levophed --->being used as much for rate control as blood pressure.  Successful DES PCI of a long lesion in the dominant RCA (synergy 3.0 mm 38 mm postdilated to 3.5-3.6 mm)  Temporary wire placement for symptom medic bradycardia cannot exclude 2: 1 AV block.  RECOMMENDATIONS Admit to inpatient-Continue temporary wire overnight to ensure stable heart rhythm.  Continue IV Levophed infusion to maintain systolic blood pressures greater than 95 and map greater than 65.  Restart IV heparin 8 hours after the radial sheath removal  Convert from simvastatin to high-dose atorvastatin  Restart Lantus with sliding scale insulin  Recommend uninterrupted dual antiplatelet therapy. Need to review Cath Lab images of the ostial circumflex flex lesion with interventional colleagues to determine if there is any PCI options. Recommendation at this time would be to treat medically and reassess for symptoms. If symptomatic 1 to 2 months post MI, would consider the possibility of high risk PCI versus CABG.    ASSESSMENT AND PLAN:  1.  CAD: S/P inferior MI with DES to the mid RCA due to 100% occlusion. He has residual disease in the LM to the ostial and proximal LAD, ostial Cx to proximal Cx, and ostial first diagonal. He remains on DAPT with Brilinta and ASA. Statin therapy. He was not placed on BB due to abnormal EKG which revealed 2nd degree AV block.   I have answered multiple questions, gone over the cath report illustration, provided the  patient with a copy of this. He is ok to drive now. He is to continue using his walker for long distances (parking lots) until he is more steady. I do not want him to fall, especially on DAPT.  He is to go to cardiac rehab before returning to the [facility] work outs.   2. Bradycardia: EKG reveals NSR with 1st degree AV block PR interval of .26 ms.with inferior Q waves. Will not place him on BB for now. HR is in the 80's at home, with BP checks. He will wear a Holter monitor as ordered on May 19, 2018.   3.Hypertension: Blood pressure is well controlled currently. No changes in regimen.  4.COPD: Follow up with PCP    Current medicines are reviewed at length with the patient today.    Labs/ tests ordered today include: None  Phill Myron. [place] Pugh, ANP, AACC   05/08/2018 11:13 AM    [address] Office [phone] Fax [phone]

## 2018-05-09 ENCOUNTER — Telehealth (HOSPITAL_COMMUNITY): Payer: Self-pay

## 2018-05-09 ENCOUNTER — Telehealth: Payer: Self-pay | Admitting: Adult Health

## 2018-05-09 ENCOUNTER — Other Ambulatory Visit (INDEPENDENT_AMBULATORY_CARE_PROVIDER_SITE_OTHER): Payer: Medicare PPO

## 2018-05-09 DIAGNOSIS — D638 Anemia in other chronic diseases classified elsewhere: Secondary | ICD-10-CM | POA: Diagnosis not present

## 2018-05-09 DIAGNOSIS — E1165 Type 2 diabetes mellitus with hyperglycemia: Secondary | ICD-10-CM

## 2018-05-09 DIAGNOSIS — Z794 Long term (current) use of insulin: Secondary | ICD-10-CM

## 2018-05-09 LAB — LIPID PANEL
CHOL/HDL RATIO: 3
Cholesterol: 108 mg/dL (ref 0–200)
HDL: 35.3 mg/dL — AB (ref >39.00)
LDL Cholesterol: 56 mg/dL (ref 0–99)
NONHDL: 72.48
TRIGLYCERIDES: 83 mg/dL (ref 0.0–149.0)
VLDL: 16.6 mg/dL (ref 0.0–40.0)

## 2018-05-09 LAB — CBC
HCT: 30.8 % — ABNORMAL LOW (ref 39.0–52.0)
HEMOGLOBIN: 10.5 g/dL — AB (ref 13.0–17.0)
MCHC: 34 g/dL (ref 30.0–36.0)
MCV: 94.9 fl (ref 78.0–100.0)
Platelets: 435 10*3/uL — ABNORMAL HIGH (ref 150.0–400.0)
RBC: 3.25 Mil/uL — AB (ref 4.22–5.81)
RDW: 13.9 % (ref 11.5–15.5)
WBC: 11.3 10*3/uL — ABNORMAL HIGH (ref 4.0–10.5)

## 2018-05-09 LAB — COMPREHENSIVE METABOLIC PANEL
ALBUMIN: 4.4 g/dL (ref 3.5–5.2)
ALK PHOS: 84 U/L (ref 39–117)
ALT: 20 U/L (ref 0–53)
AST: 24 U/L (ref 0–37)
BILIRUBIN TOTAL: 0.4 mg/dL (ref 0.2–1.2)
BUN: 59 mg/dL — ABNORMAL HIGH (ref 6–23)
CALCIUM: 9.5 mg/dL (ref 8.4–10.5)
CO2: 23 mEq/L (ref 19–32)
CREATININE: 2.57 mg/dL — AB (ref 0.40–1.50)
Chloride: 104 mEq/L (ref 96–112)
GFR: 25.2 mL/min — AB (ref >60.00)
Glucose, Bld: 128 mg/dL — ABNORMAL HIGH (ref 70–99)
Potassium: 4.2 mEq/L (ref 3.5–5.1)
Sodium: 138 mEq/L (ref 135–145)
TOTAL PROTEIN: 7.5 g/dL (ref 6.0–8.3)

## 2018-05-09 LAB — HEMOGLOBIN A1C: HEMOGLOBIN A1C: 6.6 % — AB (ref 4.6–6.5)

## 2018-05-09 NOTE — Telephone Encounter (Signed)
Called patient to see if he was interested in participating in the Cardiac Rehab Program. Patient stated yes. Patient will come in for orientation on 07/03/2018 @ 8AM and will attend the 8:15AM exercise class.  Mailed homework package.

## 2018-05-09 NOTE — Telephone Encounter (Signed)
Spoke to patient --inform him that will need to defer to [place]-  Patient aware not in office -- Will call with answer

## 2018-05-09 NOTE — Telephone Encounter (Signed)
New Message          Patient is calling today to see if it's ok to walk in the hallway at the [facility].

## 2018-05-10 NOTE — Telephone Encounter (Signed)
He may walk at the [facility] as long as he is steady on his feet and does not require more than a cane for support.

## 2018-05-12 DIAGNOSIS — I2510 Atherosclerotic heart disease of native coronary artery without angina pectoris: Secondary | ICD-10-CM | POA: Diagnosis not present

## 2018-05-12 DIAGNOSIS — J449 Chronic obstructive pulmonary disease, unspecified: Secondary | ICD-10-CM | POA: Diagnosis not present

## 2018-05-12 DIAGNOSIS — E1121 Type 2 diabetes mellitus with diabetic nephropathy: Secondary | ICD-10-CM | POA: Diagnosis not present

## 2018-05-12 DIAGNOSIS — Z794 Long term (current) use of insulin: Secondary | ICD-10-CM | POA: Diagnosis not present

## 2018-05-12 DIAGNOSIS — N183 Chronic kidney disease, stage 3 (moderate): Secondary | ICD-10-CM | POA: Diagnosis not present

## 2018-05-12 DIAGNOSIS — I10 Essential (primary) hypertension: Secondary | ICD-10-CM | POA: Diagnosis not present

## 2018-05-12 NOTE — Telephone Encounter (Signed)
Spoke to patient - patient aware of instructions for exercise. Verbalized understanding.

## 2018-05-12 NOTE — Telephone Encounter (Signed)
Left message to call back -- in regards to test results

## 2018-05-13 ENCOUNTER — Encounter: Payer: Self-pay | Admitting: Endocrinology

## 2018-05-13 ENCOUNTER — Ambulatory Visit: Payer: Medicare PPO | Admitting: Endocrinology

## 2018-05-13 VITALS — BP 126/60 | HR 106 | Ht 70.0 in | Wt 189.0 lb

## 2018-05-13 DIAGNOSIS — E1165 Type 2 diabetes mellitus with hyperglycemia: Secondary | ICD-10-CM | POA: Diagnosis not present

## 2018-05-13 DIAGNOSIS — I951 Orthostatic hypotension: Secondary | ICD-10-CM

## 2018-05-13 DIAGNOSIS — N184 Chronic kidney disease, stage 4 (severe): Secondary | ICD-10-CM

## 2018-05-13 DIAGNOSIS — D508 Other iron deficiency anemias: Secondary | ICD-10-CM | POA: Diagnosis not present

## 2018-05-13 DIAGNOSIS — Z794 Long term (current) use of insulin: Secondary | ICD-10-CM

## 2018-05-13 NOTE — Patient Instructions (Addendum)
lantus 26 units  Lasix 1 at a time  Iron 2 daily

## 2018-05-13 NOTE — Progress Notes (Signed)
Patient ID: Clayton [name], male   DOB: [birth year], 84 y.o.   MRN: [id number]   Reason for Appointment: Followup of diabetes and various issues  History of Present Illness   Type 2 DIABETES MELITUS diagnosed 1989  He has had long-standing diabetes and has been on basal bolus insulin for the last few years Since 2014 he has required larger doses of insulin especially with having to take periodic courses of steroids for his pulmonary problems Also his Actos was stopped because of tendency to edema and metformin stopped because of renal dysfunction  Insulin regimen: Novolog 7-6-8 units before meals, Lantus 24 units in a.m., using syringes  A1c is stable at 6.6, previously 6.3  Current blood sugar patterns and problems identified:  His blood sugars appear to be drifting higher in the last couple of weeks possibly since he has been less active after his MI  Has not been exercising at the gym like he was before  However his weight is down slightly from his last visit in August  His blood sugars are relatively higher throughout the day when fasting recently although still showing some fluctuation  Blood sugars tend to be the highest in the evenings  This may be even before dinnertime and not clear why, he says he does not have excessive snacks in the afternoon  Occasionally will not take his insulin when he is going out to eat causing higher readings  Also not adjusting his Humalog based on what he is eating as far as carbohydrate content or portions  No hypoglycemia however  Highest blood sugar was 334 with no insulin at suppertime and eating relatively higher carbohydrate or fat meal at dinnertime  Hypoglycemia:  recently none   Oral hypoglycemic drugs: None           Monitors blood glucose:  3-4 times a day   Glucometer: Accu-Chek        Glucose  monitor download shows the following results  Average reading are for the last 4 weeks   PRE-MEAL Fasting Lunch  Dinner Bedtime Overall  Glucose range:  87-165  109-174  66-240  73-334   Mean/median:  126  150   159  161 145   POST-MEAL PC Breakfast PC Lunch PC Dinner  Glucose range:  ?   Mean/median:      Previous readings:  PRE-MEAL Fasting Lunch Dinner Bedtime Overall  Glucose range: 92-186    80-249   Mean/median:  126  139  157  149 140 +/-    Meals: 3 meals per day.  breakfast: oatmeal / grits, with peanut butter, for lunch he will have a sandwich with apple and grapefruit   Dinner usually at 6:30 pm        Physical activity: exercise: Was doing exercise bike 1-2/7 days at the [employer]  [employer]  Weight history:  Wt Readings from Last 3 Encounters:  05/13/18 189 lb (85.7 kg)  05/08/18 191 lb (86.6 kg)  04/26/18 198 lb 11.2 oz (90.1 kg)               Complications are: Nephropathy. Microalbumin level now consistently normal  LABS:  Lab Results  Component Value Date   HGBA1C 6.6 (H) 05/09/2018   HGBA1C 6.3 (H) 04/21/2018   HGBA1C 6.8 (H) 02/07/2018   Lab Results  Component Value Date   MICROALBUR 7.6 (H) 07/04/2017   LDLCALC 56 05/09/2018   CREATININE 2.57 (H) 05/09/2018   Problem  2:  RENAL insufficiency: He has had chronic increase in creatinine, was 1.7 in 2014 with slow progression . This has been related to nephropathy and glomerulosclerosis ACE inhibitor/ARB recommended by nephrologist but not done because of the history of patient getting hyperkalemia with these drugs in the past Renal ultrasound did not show any abnormality  Creatinine has fluctuated more especially since his hospitalization  He has been recommended going back to nephrologist by his PCP yesterday and does not know when his appointment is  Lab Results  Component Value Date   CREATININE 2.57 (H) 05/09/2018   CREATININE 2.05 (H) 04/26/2018   CREATININE 2.10 (H) 04/24/2018   CREATININE 2.30 (H) 04/23/2018     He has had a normal PTH, not on calcitriol and only on vitamin D   Lab Results  Component  Value Date   PTH 54 07/04/2017   CALCIUM 9.5 05/09/2018   CAION 1.18 04/20/2018   PHOS 2.3 04/30/2014      Allergies as of 05/13/2018      Reactions   Doxycycline Rash   Levaquin [levofloxacin In D5w]    Caused chest pain and heartburn   Nitrous Oxide Other (See Comments)   Reaction:  Unknown    Penicillins Other (See Comments)   Reaction:  Unknown Has patient had a PCN reaction causing immediate rash, facial/tongue/throat swelling, SOB or lightheadedness with hypotension: Unsure Has patient had a PCN reaction causing severe rash involving mucus membranes or skin necrosis: Unsure Has patient had a PCN reaction that required hospitalization Unsure  Has patient had a PCN reaction occurring within the last 10 years: No If all of the above answers are "NO", then may proceed with Cephalosporin use.   Tape Other (See Comments)   Reaction:  Tears pts skin    Diltiazem Rash      Medication List        Accurate as of 05/13/18 11:59 PM. Always use your most recent med list.          ACCU-CHEK SOFTCLIX LANCETS lancets TEST AS INSTRUCTED TO OBTAIN A BLOOD SPECIMEN 4 TIMES DAILY   albuterol 108 (90 Base) MCG/ACT inhaler Commonly known as:  PROVENTIL HFA;VENTOLIN HFA Inhale 1-2 puffs into the lungs every 6 (six) hours as needed for wheezing or shortness of breath.   aspirin 81 MG chewable tablet Chew 1 tablet (81 mg total) by mouth daily.   atorvastatin 80 MG tablet Commonly known as:  LIPITOR Take 1 tablet (80 mg total) by mouth daily.   B-D SINGLE USE SWABS REGULAR Pads USE 7 SWABS DAILY AS DIRECTED   budesonide-formoterol 160-4.5 MCG/ACT inhaler Commonly known as:  SYMBICORT Inhale 2 puffs into the lungs 2 (two) times daily.   cholecalciferol 1000 units tablet Commonly known as:  VITAMIN D Take 2,000 Units by mouth daily.   famotidine 20 MG tablet Commonly known as:  PEPCID Take 1 tablet (20 mg total) by mouth at bedtime.   ferrous sulfate 325 (65 FE) MG  tablet Take 325 mg by mouth daily with breakfast.   finasteride 5 MG tablet Commonly known as:  PROSCAR TAKE 1 TABLET (5 MG TOTAL) BY MOUTH DAILY.   furosemide 20 MG tablet Commonly known as:  LASIX Take 1 tablet (20 mg total) by mouth daily. Take 2 tabs 5 times weekly.   insulin aspart 100 UNIT/ML injection Commonly known as:  novoLOG TAKE 8 UNITS UNDER THE SKIN DAILY Monday-Friday AND ON Saturday AND Sunday TAKE 6 UNITS AT BREAKFAST AND LUNCH,  AND 8 UNITS AT DINNER.   insulin glargine 100 UNIT/ML injection Commonly known as:  LANTUS INJECT 24 UNITS SUBCUTANEOUSLY IN THE MORNING AND 20 UNITS IN THE EVENING   Insulin Syringe-Needle U-100 31G X 5/16" 0.3 ML Misc USE THREE TIMES DAILY   isosorbide mononitrate 30 MG 24 hr tablet Commonly known as:  IMDUR Take 1 tablet (30 mg total) by mouth daily.   Melatonin 5 MG Tabs Take 1 tablet by mouth at bedtime.   multivitamin with minerals Tabs tablet Take 1 tablet by mouth daily.   omeprazole 20 MG capsule Commonly known as:  PRILOSEC Take 20 mg by mouth daily.   ticagrelor 90 MG Tabs tablet Commonly known as:  BRILINTA Take 1 tablet (90 mg total) by mouth 2 (two) times daily.   TOVIAZ 4 MG Tb24 tablet Generic drug:  fesoterodine Take 4 mg by mouth daily.       Allergies:  Allergies  Allergen Reactions  . Doxycycline Rash  . Levaquin [Levofloxacin In D5w]     Caused chest pain and heartburn  . Nitrous Oxide Other (See Comments)    Reaction:  Unknown   . Penicillins Other (See Comments)    Reaction:  Unknown Has patient had a PCN reaction causing immediate rash, facial/tongue/throat swelling, SOB or lightheadedness with hypotension: Unsure Has patient had a PCN reaction causing severe rash involving mucus membranes or skin necrosis: Unsure Has patient had a PCN reaction that required hospitalization Unsure  Has patient had a PCN reaction occurring within the last 10 years: No If all of the above answers are "NO",  then may proceed with Cephalosporin use.  . Tape Other (See Comments)    Reaction:  Tears pts skin   . Diltiazem Rash    Past Medical History:  Diagnosis Date  . Anemia   . Chronic renal insufficiency   . DDD (degenerative disc disease), lumbar   . Diabetes mellitus   . Hypertension   . Neuromuscular disorder ([place])   . Osteoporosis     Past Surgical History:  Procedure Laterality Date  . CORONARY STENT INTERVENTION N/A 04/20/2018   Procedure: CORONARY STENT INTERVENTION;  Surgeon: Leonie Man, MD;  Location: [facility];  Service: Cardiovascular;  Laterality: N/A;  . CORONARY/GRAFT ACUTE MI REVASCULARIZATION N/A 04/20/2018   Procedure: Coronary/Graft Acute MI Revascularization;  Surgeon: Leonie Man, MD;  Location: [facility];  Service: Cardiovascular;  Laterality: N/A;  . EYE SURGERY    . I&D EXTREMITY Right 08/13/2016   Procedure: IRRIGATION AND DEBRIDEMENT RIGHT ELBOW AND HAND;  Surgeon: Milly Jakob, MD;  Location: [facility];  Service: Orthopedics;  Laterality: Right;  . LEFT HEART CATH AND CORONARY ANGIOGRAPHY N/A 04/20/2018   Procedure: LEFT HEART CATH AND CORONARY ANGIOGRAPHY;  Surgeon: Leonie Man, MD;  Location: [facility];  Service: Cardiovascular;  Laterality: N/A;  . VASECTOMY    . VIDEO BRONCHOSCOPY Bilateral 08/25/2012   Procedure: VIDEO BRONCHOSCOPY WITHOUT FLUORO;  Surgeon: Brand Males, MD;  Location: [facility];  Service: Cardiopulmonary;  Laterality: Bilateral;    Family History  Problem Relation Age of Onset  . Diabetes Mellitus II Unknown   . Hypertension Unknown     Social History:  reports that he quit smoking about 44 years ago. His smoking use included cigarettes. He has a 99.00 pack-year smoking history. He quit smokeless tobacco use about 42 years ago. He reports that he does not drink alcohol or use drugs.  Review of  Systems  BLOOD PRESSURE:  He is not on any treatment for this and blood pressure is fairly  normal    BP Readings from Last 3 Encounters:  05/13/18 126/60  05/08/18 138/72  04/26/18 (!) 120/54    HYPERKALEMIA:  No recurrence of this, usually watching his diet for high potassium foods   Lab Results  Component Value Date   K 4.2 05/09/2018      EDEMA: He is taking his Lasix 20 mg 5 days a week Usually tries not to take it on the days he is away from home playing cards No lightheadedness on standing up  ANEMIA:   He has a  history of anemia related to renal dysfunction and mild iron deficiency   With his recent hospitalization his hemoglobin had gone down significantly Although he is taking iron with a low iron saturation his hemoglobin is still low   Lab Results  Component Value Date   WBC 11.3 (H) 05/09/2018   HGB 10.5 (L) 05/09/2018   HCT 30.8 (L) 05/09/2018   MCV 94.9 05/09/2018   PLT 435.0 (H) 05/09/2018   Lab Results  Component Value Date   IRON 21 (L) 04/23/2018   TIBC 230 (L) 04/23/2018   FERRITIN 21 (L) 04/23/2018    He has been Using CPAP and this is being adjusted by pulmonologist.    He has had chronic lower urinary tract problems and BPH.  Prescriptions are being done by his urologist  HYPERLIPIDEMIA:     He is now on atorvastatin since his MI prescribed by cardiologist   Lab Results  Component Value Date   CHOL 108 05/09/2018   HDL 35.30 (L) 05/09/2018   LDLCALC 56 05/09/2018   TRIG 83.0 05/09/2018   CHOLHDL 3 05/09/2018   Lab Results  Component Value Date   ALT 20 05/09/2018    Diabetic foot exam results: normal monofilament sensation in the toes and plantar surfaces, no skin lesions or ulcers on the feet and normal pedal pulses   Examination:   BP 126/60 (BP Location: Left Arm, Patient Position: Sitting, Cuff Size: Normal)   Pulse (!) 106   Ht 5\' 10"  (1.778 m)   Wt 189 lb (85.7 kg)   SpO2 96%   BMI 27.12 kg/m   Body mass index is 27.12 kg/m.   No ankle edema present  Standing blood pressure  100/48   Assesment/PLAN:   1. Diabetes type 2 with obesity treated with basal bolus insulin  See history of present illness for detailed discussion of current diabetes management, blood sugar patterns and problems identifie  Considering his age his A1c is still excellent at 6.3  However his blood sugars are appearing to be higher even fasting recently partly because of recent stress from his MI and also decreased activity level He does not adjust his insulin based on what he is eating and periodically eating higher fat foods at restaurants 6.8 although higher than before  Postprandial readings tend to be higher after lunch and dinner He can use the freestyle libre system since he is not checking blood sugars 4 times a day and taking 4 injections a day with adjustment for high readings  Recommend that he increase his Lantus by 2 units at least  2.   Chronic kidney disease: His creatinine is relatively worse He is going to be seen by nephrologist   3.  Hypertension in the past: Blood pressure is low normal and dropping on standing up He will reduce his  Lasix to 1 tablet daily for now  4.  History of mild anemia,   likely related to chronic disease of renal insufficiency along with iron deficiency This is relatively worse but asymptomatic Discussed treatment with nephrologist, likely needs IV iron In the meantime we will increase his iron to twice a day  5.  EDEMA is controlled above  6.  Lipids: He is now on high-dose Lipitor because of his MI and he will continue, no side effects currently or change in liver functions  Patient Instructions  lantus 26 units  Lasix 1 at a time  Iron 2 daily       Elayne Snare  05/14/2018, 9:03 AM

## 2018-05-18 HISTORY — PX: OTHER SURGICAL HISTORY: SHX169

## 2018-05-19 ENCOUNTER — Other Ambulatory Visit: Payer: Self-pay | Admitting: Cardiology

## 2018-05-19 ENCOUNTER — Ambulatory Visit (INDEPENDENT_AMBULATORY_CARE_PROVIDER_SITE_OTHER): Payer: Medicare PPO

## 2018-05-19 DIAGNOSIS — I441 Atrioventricular block, second degree: Secondary | ICD-10-CM

## 2018-05-19 DIAGNOSIS — R001 Bradycardia, unspecified: Secondary | ICD-10-CM

## 2018-05-28 ENCOUNTER — Telehealth: Payer: Self-pay | Admitting: Adult Health

## 2018-05-28 NOTE — Telephone Encounter (Signed)
Pt called for Holter results.. Advised him that we are waiting for the physician to review and will call him as soon as we get it back.. Will forward to Dr. Cecil Cranker NP.

## 2018-05-28 NOTE — Telephone Encounter (Signed)
Patient called for results to his Holter monitor.

## 2018-05-29 DIAGNOSIS — N183 Chronic kidney disease, stage 3 (moderate): Secondary | ICD-10-CM | POA: Diagnosis not present

## 2018-05-29 DIAGNOSIS — D631 Anemia in chronic kidney disease: Secondary | ICD-10-CM | POA: Diagnosis not present

## 2018-05-29 DIAGNOSIS — N5201 Erectile dysfunction due to arterial insufficiency: Secondary | ICD-10-CM | POA: Diagnosis not present

## 2018-05-29 DIAGNOSIS — I129 Hypertensive chronic kidney disease with stage 1 through stage 4 chronic kidney disease, or unspecified chronic kidney disease: Secondary | ICD-10-CM | POA: Diagnosis not present

## 2018-05-29 DIAGNOSIS — N3941 Urge incontinence: Secondary | ICD-10-CM | POA: Diagnosis not present

## 2018-05-29 NOTE — Telephone Encounter (Signed)
Report just read.  Will be calling with results soon.  Glenetta Hew, MD

## 2018-05-29 NOTE — Progress Notes (Signed)
Insurance company does not understand that while yes he could benefit from beta-blocker.  With his first AV block and Mobitz type I block, I would not put him on a beta-blocker at this time since he has rare PVCs. In fact he did not have any fast beats even of PVCs.  Would prefer not to place him on beta-blocker.  Beta-blockers do not always work well with patients in the 80s.  Slow heart rates are more scary than fast.  Glenetta Hew, MD

## 2018-05-29 NOTE — Telephone Encounter (Signed)
-----   Message from Leonie Man, MD sent at 05/29/2018 12:27 PM EST ----- 48-hour Holter showing rare PVCs mostly singlets of 2 different morphologies.  Intermittent episodes of couplets but 17 runs of 3-9 beats.  Runs did not appear to be tachycardic, appeared to be more consistent with accelerated ventricular rhythm.  Slowest heart rates were noted to be when he was into the 1 AV block, has otherwise also had Wenkebach (Mobitz type I) .  Could probably benefit from beta-blocker. Bradycardia does not appear to be true sinus bradycardia, at this time no indication for pacemaker.   Glenetta Hew, MD

## 2018-05-29 NOTE — Telephone Encounter (Signed)
Spoke to patient. Result given . Verbalized understanding  patient states  His insurance called him a recommend he goes a  Beta blocker- RN  Informed patient  Dr Ellyn Hack will discuss at office appointment in feb 2020. RN also stated patient will review with Dr Ellyn Hack to see mediction need to be adde before  Appointment or  bring patient in sooner  Patient understanding.

## 2018-06-03 ENCOUNTER — Telehealth: Payer: Self-pay | Admitting: Endocrinology

## 2018-06-03 NOTE — Telephone Encounter (Signed)
Patient would like to start on [place]. Please Advise, thanks

## 2018-06-04 NOTE — Telephone Encounter (Signed)
Called pt and informed him that he must call his insurance company and verify who his DME supplier is. Pt verbalized understanding.

## 2018-06-04 NOTE — Telephone Encounter (Signed)
He will have to call 1 of the DME suppliers for Medicare to get these

## 2018-06-04 NOTE — Telephone Encounter (Signed)
Pt called back and stated that his insurance said he can use the following: [employer]: [id number] [employer]: [phone] Will contact one of these, and will get pt set up.

## 2018-06-04 NOTE — Telephone Encounter (Signed)
Did you speak to the pt about this?

## 2018-06-04 NOTE — Telephone Encounter (Signed)
Patient has called requesting to speak with the nurse.

## 2018-06-09 ENCOUNTER — Other Ambulatory Visit: Payer: Self-pay

## 2018-06-09 ENCOUNTER — Telehealth: Payer: Self-pay | Admitting: Endocrinology

## 2018-06-09 MED ORDER — GLUCOSE BLOOD VI STRP: ORAL_STRIP | 12 refills | Status: AC

## 2018-06-09 MED ORDER — GLUCOSE BLOOD VI STRP: ORAL_STRIP | 12 refills | Status: DC

## 2018-06-09 NOTE — Telephone Encounter (Signed)
MEDICATION: Accucheck Test Strips asap  PHARMACY:  [facility] on [address] : 50 tests strips  IS PATIENT OUT OF MEDICATION: Yes  IF NOT; HOW MUCH IS LEFT:   LAST APPOINTMENT DATE: @12 /17/2019  NEXT APPOINTMENT DATE:@2 /20/2020  DO WE HAVE YOUR PERMISSION TO LEAVE A DETAILED MESSAGE:Yes  OTHER COMMENTS:  Patient leaving town and needs to pick up test strips from Pharmacy soon   **Let patient know to contact pharmacy at the end of the day to make sure medication is ready. **  ** Please notify patient to allow 48-72 hours to process**  **Encourage patient to contact the pharmacy for refills or they can request refills through [facility]**

## 2018-06-09 NOTE — Telephone Encounter (Signed)
rx sent

## 2018-06-12 ENCOUNTER — Encounter: Payer: Self-pay | Admitting: Gastroenterology

## 2018-06-13 ENCOUNTER — Emergency Department (HOSPITAL_COMMUNITY)
Admission: EM | Admit: 2018-06-13 | Discharge: 2018-06-14 | Disposition: A | Discharge status: Home / Self Care | Payer: Medicare PPO | Attending: Emergency Medicine | Admitting: Emergency Medicine

## 2018-06-13 ENCOUNTER — Encounter (HOSPITAL_COMMUNITY): Payer: Self-pay | Admitting: Emergency Medicine

## 2018-06-13 ENCOUNTER — Emergency Department (HOSPITAL_COMMUNITY): Payer: Medicare PPO

## 2018-06-13 DIAGNOSIS — Z79899 Other long term (current) drug therapy: Secondary | ICD-10-CM | POA: Insufficient documentation

## 2018-06-13 DIAGNOSIS — E119 Type 2 diabetes mellitus without complications: Secondary | ICD-10-CM | POA: Insufficient documentation

## 2018-06-13 DIAGNOSIS — J069 Acute upper respiratory infection, unspecified: Secondary | ICD-10-CM

## 2018-06-13 DIAGNOSIS — Z87891 Personal history of nicotine dependence: Secondary | ICD-10-CM | POA: Insufficient documentation

## 2018-06-13 DIAGNOSIS — I10 Essential (primary) hypertension: Secondary | ICD-10-CM | POA: Diagnosis not present

## 2018-06-13 DIAGNOSIS — J449 Chronic obstructive pulmonary disease, unspecified: Secondary | ICD-10-CM | POA: Diagnosis not present

## 2018-06-13 DIAGNOSIS — Z794 Long term (current) use of insulin: Secondary | ICD-10-CM | POA: Diagnosis not present

## 2018-06-13 DIAGNOSIS — R0602 Shortness of breath: Secondary | ICD-10-CM | POA: Diagnosis not present

## 2018-06-13 DIAGNOSIS — R05 Cough: Secondary | ICD-10-CM | POA: Diagnosis present

## 2018-06-13 LAB — BASIC METABOLIC PANEL
Anion gap: 9 (ref 5–15)
BUN: 42 mg/dL — ABNORMAL HIGH (ref 8–23)
CHLORIDE: 104 mmol/L (ref 98–111)
CO2: 24 mmol/L (ref 22–32)
CREATININE: 2.44 mg/dL — AB (ref 0.61–1.24)
Calcium: 9 mg/dL (ref 8.9–10.3)
GFR calc non Af Amer: 23 mL/min — ABNORMAL LOW (ref >60)
GFR, EST AFRICAN AMERICAN: 26 mL/min — AB (ref >60)
Glucose, Bld: 213 mg/dL — ABNORMAL HIGH (ref 70–99)
Potassium: 4.1 mmol/L (ref 3.5–5.1)
SODIUM: 137 mmol/L (ref 135–145)

## 2018-06-13 LAB — CBC
HEMATOCRIT: 25.1 % — AB (ref 39.0–52.0)
HEMOGLOBIN: 7.7 g/dL — AB (ref 13.0–17.0)
MCH: 31.3 pg (ref 26.0–34.0)
MCHC: 30.7 g/dL (ref 30.0–36.0)
MCV: 102 fL — AB (ref 80.0–100.0)
NRBC: 0 % (ref 0.0–0.2)
Platelets: 371 10*3/uL (ref 150–400)
RBC: 2.46 MIL/uL — ABNORMAL LOW (ref 4.22–5.81)
RDW: 14.3 % (ref 11.5–15.5)
WBC: 18.5 10*3/uL — AB (ref 4.0–10.5)

## 2018-06-13 LAB — I-STAT TROPONIN, ED: Troponin i, poc: 0.03 ng/mL (ref 0.00–0.08)

## 2018-06-13 NOTE — ED Triage Notes (Signed)
Sent by [facility] walk in clinic for Left pleural effusion.  Patient reports having increased wheezing and cough that prompted him to go to [place].  Hx of MI and reports he is taking lasix.  Denies having any SOB or CP.

## 2018-06-14 NOTE — ED Provider Notes (Signed)
[facility] EMERGENCY DEPARTMENT Provider Note   CSN: [id number] Arrival date & time: 06/13/18  2052     History   Chief Complaint Chief Complaint  Patient presents with  . Pleural Effusion    HPI Clayton [name] is a 84 y.o. male.  HPI Patient sent in for a cough.  Reportedly had been at a house in [place].  Family members think it could have been mold there.  Had been coughing with some clear sputum production.  Has been on Mucinex but only had a couple doses.  Went into [place] walk-in [facility] today.  States wants to get out of the house the coughing is decreased.  Sent in reportedly for the cough and reportedly because of pleural effusion on x-ray.  No fevers.  Patient feeling much better.  Does have a history of COPD.  Has inhalers at home for that.  No chest pain. Past Medical History:  Diagnosis Date  . Anemia   . Chronic renal insufficiency   . DDD (degenerative disc disease), lumbar   . Diabetes mellitus   . Hypertension   . Neuromuscular disorder ([place])   . Osteoporosis     Patient Active Problem List   Diagnosis Date Noted  . Rhonchi at both lung bases   . AV heart block   . Coronary artery disease   . Anemia   . Chronic obstructive pulmonary disease ([place])   . Acute ST elevation myocardial infarction (STEMI) of inferior wall ([place]) 04/20/2018  . Symptomatic bradycardia 04/20/2018  . Iron deficiency 10/30/2016  . Cellulitis of right hand 08/11/2016  . Type 2 diabetes mellitus with hyperglycemia, with long-term current use of insulin ([place]) 08/07/2016  . Chronic kidney disease, stage IV (severe) ([place]) 08/07/2016  . Type II or unspecified type diabetes mellitus with renal manifestations, uncontrolled(250.42) 02/03/2014  . Type II or unspecified type diabetes mellitus without mention of complication, uncontrolled 07/03/2013  . BPH (benign prostatic hyperplasia) 07/03/2013  . Pure hypercholesterolemia 04/06/2013  . Dyspnea 03/04/2013  . Type II  or unspecified type diabetes mellitus without mention of complication, not stated as uncontrolled 01/01/2013  . Smoking history 07/24/2012  . Cough variant asthma 07/24/2012  . Solitary pulmonary nodule 06/27/2012  . Hypoxia 06/19/2012  . Diabetes mellitus ([place]) 06/19/2012  . CKD (chronic kidney disease) stage 3, GFR 30-59 ml/min (HCC) 06/19/2012  . Hard of hearing 06/19/2012  . Essential hypertension 05/19/2010  . OSA treated with BiPAP 05/19/2010    Past Surgical History:  Procedure Laterality Date  . CORONARY STENT INTERVENTION N/A 04/20/2018   Procedure: CORONARY STENT INTERVENTION;  Surgeon: Leonie Man, MD;  Location: [place] [facility];  Service: Cardiovascular;  Laterality: N/A;  . CORONARY/GRAFT ACUTE MI REVASCULARIZATION N/A 04/20/2018   Procedure: Coronary/Graft Acute MI Revascularization;  Surgeon: Leonie Man, MD;  Location: [place] [facility];  Service: Cardiovascular;  Laterality: N/A;  . EYE SURGERY    . I&D EXTREMITY Right 08/13/2016   Procedure: IRRIGATION AND DEBRIDEMENT RIGHT ELBOW AND HAND;  Surgeon: Milly Jakob, MD;  Location: [facility];  Service: Orthopedics;  Laterality: Right;  . LEFT HEART CATH AND CORONARY ANGIOGRAPHY N/A 04/20/2018   Procedure: LEFT HEART CATH AND CORONARY ANGIOGRAPHY;  Surgeon: Leonie Man, MD;  Location: [place] [facility];  Service: Cardiovascular;  Laterality: N/A;  . VASECTOMY    . VIDEO BRONCHOSCOPY Bilateral 08/25/2012   Procedure: VIDEO BRONCHOSCOPY WITHOUT FLUORO;  Surgeon: Brand Males, MD;  Location: [facility] ENDOSCOPY;  Service:  Cardiopulmonary;  Laterality: Bilateral;        Home Medications    Prior to Admission medications   Medication Sig Start Date End Date Taking? Authorizing Provider  ACCU-CHEK SOFTCLIX LANCETS lancets TEST AS INSTRUCTED TO OBTAIN A BLOOD SPECIMEN 4 TIMES DAILY 02/26/18   Elayne Snare, MD  albuterol (PROVENTIL HFA;VENTOLIN HFA) 108 (90 Base) MCG/ACT inhaler Inhale 1-2 puffs into the lungs every  6 (six) hours as needed for wheezing or shortness of breath.    [provider]  Alcohol Swabs (B-D SINGLE USE SWABS REGULAR) PADS USE 7 SWABS DAILY AS DIRECTED 02/18/18   Elayne Snare, MD  aspirin 81 MG chewable tablet Chew 1 tablet (81 mg total) by mouth daily. 04/27/18   Richardson Dopp T, PA-C  atorvastatin (LIPITOR) 80 MG tablet Take 1 tablet (80 mg total) by mouth daily. 04/26/18   Richardson Dopp T, PA-C  budesonide-formoterol (SYMBICORT) 160-4.5 MCG/ACT inhaler Inhale 2 puffs into the lungs 2 (two) times daily. 07/29/13   Tanda Rockers, MD  cholecalciferol (VITAMIN D) 1000 units tablet Take 2,000 Units by mouth daily.    [provider]  famotidine (PEPCID) 20 MG tablet Take 1 tablet (20 mg total) by mouth at bedtime. 04/26/18   Richardson Dopp T, PA-C  ferrous sulfate 325 (65 FE) MG tablet Take 325 mg by mouth daily with breakfast.    [provider]  fesoterodine (TOVIAZ) 4 MG TB24 tablet Take 4 mg by mouth daily.    [provider]  finasteride (PROSCAR) 5 MG tablet TAKE 1 TABLET (5 MG TOTAL) BY MOUTH DAILY. Patient taking differently: Take 5 mg by mouth daily.  02/19/18   Elayne Snare, MD  furosemide (LASIX) 20 MG tablet Take 1 tablet (20 mg total) by mouth daily. Take 2 tabs 5 times weekly. 10/31/17   Elayne Snare, MD  glucose blood test strip Use as instructed to test blood sugars 4 times daily 06/09/18   Elayne Snare, MD  insulin aspart (NOVOLOG) 100 UNIT/ML injection TAKE 8 UNITS UNDER THE SKIN DAILY Monday-Friday AND ON Saturday AND Sunday TAKE 6 UNITS AT BREAKFAST AND LUNCH, AND 8 UNITS AT DINNER. Patient taking differently: TAKE 7 UNITS IN THE MORNING, 6 UNITS AT LUNCH, AND 8 UNITS AT SUPPER. 12/04/17   Elayne Snare, MD  insulin glargine (LANTUS) 100 UNIT/ML injection INJECT 24 UNITS SUBCUTANEOUSLY IN THE MORNING AND 20 UNITS IN THE EVENING Patient taking differently: Inject 24 Units into the skin every morning. TAKE 24 UNITS EVERY MORNING. 09/18/17   Elayne Snare,  MD  Insulin Syringe-Needle U-100 (BD INSULIN SYRINGE U/F) 31G X 5/16" 0.3 ML MISC USE THREE TIMES DAILY 02/12/18   Elayne Snare, MD  isosorbide mononitrate (IMDUR) 30 MG 24 hr tablet Take 1 tablet (30 mg total) by mouth daily. 04/27/18   Richardson Dopp T, PA-C  Melatonin 5 MG TABS Take 1 tablet by mouth at bedtime.    [provider]  Multiple Vitamin (MULTIVITAMIN WITH MINERALS) TABS tablet Take 1 tablet by mouth daily.    [provider]  omeprazole (PRILOSEC) 20 MG capsule Take 20 mg by mouth daily.    [provider]  ticagrelor (BRILINTA) 90 MG TABS tablet Take 1 tablet (90 mg total) by mouth 2 (two) times daily. 04/26/18 04/26/19  Liliane Shi, PA-C    Family History Family History  Problem Relation Age of Onset  . Diabetes Mellitus II Other   . Hypertension Other     Social History Social History  Tobacco Use  . Smoking status: Former Smoker    Packs/day: 3.00    Years: 33.00    Pack years: 99.00    Types: Cigarettes    Last attempt to quit: 06/19/1973    Years since quitting: 45.0  . Smokeless tobacco: Former Systems developer    Quit date: 09/26/1975  Substance Use Topics  . Alcohol use: No  . Drug use: No     Allergies   Doxycycline; Levaquin [levofloxacin in d5w]; Nitrous oxide; Penicillins; Tape; and Diltiazem   Review of Systems Review of Systems  Constitutional: Negative for appetite change.  HENT: Negative for congestion.   Respiratory: Positive for shortness of breath and wheezing.   Cardiovascular: Negative for chest pain.  Gastrointestinal: Negative for abdominal pain.  Genitourinary: Negative for flank pain.  Musculoskeletal: Negative for back pain.  Skin: Negative for rash.  Neurological: Negative for weakness.  Psychiatric/Behavioral: Negative for confusion.     Physical Exam Updated Vital Signs BP (!) 134/58   Pulse (!) 101   Temp 97.9 F (36.6 C) (Oral)   Resp (!) 29   Ht 5\' 9"  (1.753 m)   Wt 89.4 kg   SpO2 94%   BMI  29.09 kg/m   Physical Exam Vitals signs and nursing note reviewed.  HENT:     Head: Normocephalic.     Mouth/Throat:     Mouth: Mucous membranes are moist.  Eyes:     General:        Right eye: No discharge.        Left eye: No discharge.  Cardiovascular:     Rate and Rhythm: Regular rhythm.  Pulmonary:     Breath sounds: Wheezing present.     Comments: Mild wheezes to right upper lung fields. Abdominal:     Tenderness: There is no abdominal tenderness.  Musculoskeletal:        General: No tenderness.  Neurological:     Mental Status: He is alert. Mental status is at baseline.      ED Treatments / Results  Labs (all labs ordered are listed, but only abnormal results are displayed) Labs Reviewed  BASIC METABOLIC PANEL - Abnormal; Notable for the following components:      Result Value   Glucose, Bld 213 (*)    BUN 42 (*)    Creatinine, Ser 2.44 (*)    GFR calc non Af Amer 23 (*)    GFR calc Af Amer 26 (*)    All other components within normal limits  CBC - Abnormal; Notable for the following components:   WBC 18.5 (*)    RBC 2.46 (*)    Hemoglobin 7.7 (*)    HCT 25.1 (*)    MCV 102.0 (*)    All other components within normal limits  I-STAT TROPONIN, ED    EKG None  Radiology Dg Chest 2 View  Result Date: 06/13/2018 CLINICAL DATA:  Left pleural effusion EXAM: CHEST - 2 VIEW COMPARISON:  06/13/2018 performed at [facility]: Normal heart size. Minimal aortic atherosclerosis without aneurysm. Trace left effusion unchanged from prior with bibasilar atelectasis. No overt pulmonary edema. No acute osseous abnormality. IMPRESSION: Trace left pleural effusion without overt pulmonary edema. No significant change from earlier same day exam. No drainable fluid collections. Electronically Signed   By: Ashley Royalty M.D.   On: 06/13/2018 21:54    Procedures Procedures (including critical care time)  Medications Ordered in ED Medications - No data to  display  Initial Impression / Assessment and Plan / ED Course  I have reviewed the triage vital signs and the nursing notes.  Pertinent labs & imaging results that were available during my care of the patient were reviewed by me and considered in my medical decision making (see chart for details).     Patient with URI symptoms.  X-ray showed only mild pleural effusion on left.  No pneumonia.  Patient's coughing has decreased significantly since he left the house that he was at.  Has had issues with his sugar going high with prednisone.  Without infiltrates and improved cough with decreased sputum production I not think he needs antibiotics at this time.  On my physical exam his respiratory rate is much lower than the 29 documented.  Does not appear to be in any distress.  Discharge home. He has an anemia.  Appears to be just slightly lower than his baseline when labs were reviewed.  Can be followed as an outpatient. Final Clinical Impressions(s) / ED Diagnoses   Final diagnoses:  Upper respiratory tract infection, unspecified type    ED Discharge Orders    None       Davonna Belling, MD 06/14/18 [phone]

## 2018-06-16 DIAGNOSIS — N189 Chronic kidney disease, unspecified: Secondary | ICD-10-CM | POA: Diagnosis not present

## 2018-06-19 DIAGNOSIS — E1121 Type 2 diabetes mellitus with diabetic nephropathy: Secondary | ICD-10-CM | POA: Diagnosis not present

## 2018-06-19 DIAGNOSIS — N183 Chronic kidney disease, stage 3 (moderate): Secondary | ICD-10-CM | POA: Diagnosis not present

## 2018-06-19 DIAGNOSIS — Z794 Long term (current) use of insulin: Secondary | ICD-10-CM | POA: Diagnosis not present

## 2018-06-19 DIAGNOSIS — R05 Cough: Secondary | ICD-10-CM | POA: Diagnosis not present

## 2018-06-19 DIAGNOSIS — D631 Anemia in chronic kidney disease: Secondary | ICD-10-CM | POA: Diagnosis not present

## 2018-06-25 ENCOUNTER — Telehealth (HOSPITAL_COMMUNITY): Payer: Self-pay

## 2018-06-25 NOTE — Telephone Encounter (Signed)
Cardiac Rehab Medication Review by a Pharmacist  Does the patient  feel that his/her medications are working for him/her?  yes  Has the patient been experiencing any side effects to the medications prescribed?  no  Does the patient measure his/her own blood pressure or blood glucose at home?  yes   Does the patient have any problems obtaining medications due to transportation or finances?   no  Understanding of regimen: fair Understanding of indications: fair Potential of compliance: good  Pharmacist comments:  Patient states he measures his own blood pressure and blood glucose at home, however these numbers have been variable over the last few days with him fighting a cold  Vertis Kelch, PharmD PGY1 Pharmacy Resident Phone [phone] 06/25/2018       11:19 AM

## 2018-06-27 ENCOUNTER — Telehealth: Payer: Self-pay | Admitting: Endocrinology

## 2018-06-27 NOTE — Telephone Encounter (Signed)
He can go up to 20 units of NovoLog with each meal whenever blood sugar is over 200.  Also find out if he is going to be taking oral prednisone also

## 2018-06-27 NOTE — Telephone Encounter (Signed)
Please call patient at ph# [phone] re: patient took a steroid medication and is unable to get his blood sugars down-yesterday 296+,-today 247. Please call patient at the ph# listed above to advise

## 2018-06-27 NOTE — Telephone Encounter (Signed)
Called pt and gave him MD message. Pt verbalized understanding.   Pt stated that he was only on prednisone because he was sick.

## 2018-06-27 NOTE — Telephone Encounter (Signed)
Please see blood sugar log and advise.

## 2018-06-30 ENCOUNTER — Telehealth (HOSPITAL_COMMUNITY): Payer: Self-pay

## 2018-06-30 NOTE — Progress Notes (Signed)
Clayton [name] 85 y.o. male DOB September 12, 1927 MRN [id number]       Nutrition  No diagnosis found. Past Medical History:  Diagnosis Date  . Anemia   . Chronic renal insufficiency   . DDD (degenerative disc disease), lumbar   . Diabetes mellitus   . Hypertension   . Neuromuscular disorder ([place])   . Osteoporosis    Meds reviewed.    Current Outpatient Medications (Endocrine & Metabolic):  .  insulin aspart (NOVOLOG) 100 UNIT/ML injection, TAKE 8 UNITS UNDER THE SKIN DAILY Monday-Friday AND ON Saturday AND Sunday TAKE 6 UNITS AT BREAKFAST AND LUNCH, AND 8 UNITS AT DINNER. (Patient taking differently: TAKE 7 UNITS IN THE MORNING, 6 UNITS AT LUNCH, AND 8 UNITS AT SUPPER.) .  insulin glargine (LANTUS) 100 UNIT/ML injection, INJECT 24 UNITS SUBCUTANEOUSLY IN THE MORNING AND 20 UNITS IN THE EVENING (Patient taking differently: Inject 24 Units into the skin every morning. TAKE 24 UNITS EVERY MORNING.)  Current Outpatient Medications (Cardiovascular):  .  atorvastatin (LIPITOR) 80 MG tablet, Take 1 tablet (80 mg total) by mouth daily. .  furosemide (LASIX) 20 MG tablet, Take 1 tablet (20 mg total) by mouth daily. Take 2 tabs 5 times weekly. (Patient taking differently: Take 20 mg by mouth See admin instructions. Take 1 tabs 5 times weekly (Sunday, Monday, Tuesday, Thursday, and Saturday)) .  isosorbide mononitrate (IMDUR) 30 MG 24 hr tablet, Take 1 tablet (30 mg total) by mouth daily.  Current Outpatient Medications (Respiratory):  .  albuterol (PROVENTIL HFA;VENTOLIN HFA) 108 (90 Base) MCG/ACT inhaler, Inhale 1-2 puffs into the lungs every 6 (six) hours as needed for wheezing or shortness of breath. .  budesonide-formoterol (SYMBICORT) 160-4.5 MCG/ACT inhaler, Inhale 2 puffs into the lungs 2 (two) times daily.  Current Outpatient Medications (Analgesics):  .  aspirin 81 MG chewable tablet, Chew 1 tablet (81 mg total) by mouth daily.  Current Outpatient Medications (Hematological):  .  ferrous  sulfate 325 (65 FE) MG tablet, Take 650 mg by mouth daily with breakfast.  .  ticagrelor (BRILINTA) 90 MG TABS tablet, Take 1 tablet (90 mg total) by mouth 2 (two) times daily.  Current Outpatient Medications (Other):  Marland Kitchen  ACCU-CHEK SOFTCLIX LANCETS lancets, TEST AS INSTRUCTED TO OBTAIN A BLOOD SPECIMEN 4 TIMES DAILY .  Alcohol Swabs (B-D SINGLE USE SWABS REGULAR) PADS, USE 7 SWABS DAILY AS DIRECTED .  cholecalciferol (VITAMIN D) 1000 units tablet, Take 2,000 Units by mouth daily. .  famotidine (PEPCID) 20 MG tablet, Take 1 tablet (20 mg total) by mouth at bedtime. .  fesoterodine (TOVIAZ) 4 MG TB24 tablet, Take 4 mg by mouth daily. .  finasteride (PROSCAR) 5 MG tablet, TAKE 1 TABLET (5 MG TOTAL) BY MOUTH DAILY. (Patient taking differently: Take 5 mg by mouth daily. ) .  glucose blood test strip, Use as instructed to test blood sugars 4 times daily .  Insulin Syringe-Needle U-100 (BD INSULIN SYRINGE U/F) 31G X 5/16" 0.3 ML MISC, USE THREE TIMES DAILY .  Melatonin 5 MG TABS, Take 2 tablets by mouth at bedtime.  .  Multiple Vitamin (MULTIVITAMIN WITH MINERALS) TABS tablet, Take 1 tablet by mouth daily. Marland Kitchen  omeprazole (PRILOSEC) 20 MG capsule, Take 20 mg by mouth daily.   HT: Ht Readings from Last 1 Encounters:  06/13/18 5\' 9"  (1.753 m)    WT: Wt Readings from Last 5 Encounters:  06/13/18 197 lb (89.4 kg)  05/13/18 189 lb (85.7 kg)  05/08/18 191 lb (86.6  kg)  04/26/18 198 lb 11.2 oz (90.1 kg)  02/12/18 196 lb 12.8 oz (89.3 kg)     BMI = 27.12   Current tobacco use? No       Labs:  Lipid Panel     Component Value Date/Time   CHOL 108 05/09/2018 0820   TRIG 83.0 05/09/2018 0820   HDL 35.30 (L) 05/09/2018 0820   CHOLHDL 3 05/09/2018 0820   VLDL 16.6 05/09/2018 0820   LDLCALC 56 05/09/2018 0820    Lab Results  Component Value Date   HGBA1C 6.6 (H) 05/09/2018   CBG (last 3)  No results for input(s): GLUCAP in the last 72 hours.  Nutrition Diagnosis ? Food-and  nutrition-related knowledge deficit related to lack of exposure to information as related to diagnosis of: ? CVD ? Type 2 Diabetes  Nutrition Goal(s):  ? To be determined  Plan:  Pt to attend nutrition classes ? Nutrition I ? Nutrition II ? Portion Distortion  ? Diabetes Blitz ? Diabetes Q & A Will provide client-centered nutrition education as part of interdisciplinary care.   Monitor and evaluate progress toward nutrition goal with team.  Laurina Bustle, MS, RD, LDN 06/30/2018 9:37 AM

## 2018-07-02 ENCOUNTER — Telehealth (HOSPITAL_COMMUNITY): Payer: Self-pay | Admitting: Cardiac Rehabilitation

## 2018-07-02 NOTE — Telephone Encounter (Signed)
PC to pt daughter to discuss pt fall at home. Per Pt Daughter report, on 06/07/2018, pt fell upon standing from seated position for long period of time.pt scraped his elbow and bruised his buttocks. Then 06/11/2018 pt CBG >300. 12/29/20019 pt OV with PCP who referred to ED for eval.  Pt diagnosed with mild pleural effusion.  Antibiotics and steroids prescribed by PCP the following day.   CBG >500, Dr. Dwyane Dee adjusted insulin regimen to treat steroid induced hyperglycemia.  Pt does live with his daughter. Pt daughter reports respiratory illness resolving.  Hyperglycemia resolving.  Will cautiously proceed with CRPII orientation.  Understanding verbalized. Andi Hence, RN, BSN Cardiac Pulmonary Rehab

## 2018-07-03 ENCOUNTER — Encounter (HOSPITAL_COMMUNITY)
Admission: RE | Admit: 2018-07-03 | Discharge: 2018-07-03 | Disposition: A | Discharge status: Home / Self Care | Payer: Medicare PPO | Source: Ambulatory Visit | Attending: Cardiology | Admitting: Cardiology

## 2018-07-03 ENCOUNTER — Encounter (HOSPITAL_COMMUNITY): Payer: Self-pay

## 2018-07-03 VITALS — BP 120/70 | HR 98 | Ht 70.0 in | Wt 191.1 lb

## 2018-07-03 DIAGNOSIS — Z955 Presence of coronary angioplasty implant and graft: Secondary | ICD-10-CM

## 2018-07-03 DIAGNOSIS — I2111 ST elevation (STEMI) myocardial infarction involving right coronary artery: Secondary | ICD-10-CM | POA: Insufficient documentation

## 2018-07-03 NOTE — Progress Notes (Signed)
Cardiac Individual Treatment Plan  Patient Details  Name: Clayton [name] MRN: [id number] Date of Birth: March 21, 1928 Referring Provider:     CARDIAC REHAB PHASE II ORIENTATION from 07/03/2018 in [place]  Referring Provider  Dr. Ellyn Hack      Initial Encounter Date:    CARDIAC REHAB PHASE II ORIENTATION from 07/03/2018 in [place]  Date  07/03/18      Visit Diagnosis: ST elevation myocardial infarction involving right coronary artery ([place]), 04/20/18  Status post coronary artery stent placement, s/p DES RCA 04/20/18  Patient's Home Medications on Admission:  Current Outpatient Medications:  .  ACCU-CHEK SOFTCLIX LANCETS lancets, TEST AS INSTRUCTED TO OBTAIN A BLOOD SPECIMEN 4 TIMES DAILY, Disp: 400 each, Rfl: 1 .  albuterol (PROVENTIL HFA;VENTOLIN HFA) 108 (90 Base) MCG/ACT inhaler, Inhale 1-2 puffs into the lungs every 6 (six) hours as needed for wheezing or shortness of breath., Disp: , Rfl:  .  Alcohol Swabs (B-D SINGLE USE SWABS REGULAR) PADS, USE 7 SWABS DAILY AS DIRECTED, Disp: 600 each, Rfl: 0 .  aspirin 81 MG chewable tablet, Chew 1 tablet (81 mg total) by mouth daily., Disp: 90 tablet, Rfl: 3 .  atorvastatin (LIPITOR) 80 MG tablet, Take 1 tablet (80 mg total) by mouth daily., Disp: 30 tablet, Rfl: 11 .  budesonide-formoterol (SYMBICORT) 160-4.5 MCG/ACT inhaler, Inhale 2 puffs into the lungs 2 (two) times daily., Disp: 10.2 g, Rfl: 5 .  cholecalciferol (VITAMIN D) 1000 units tablet, Take 2,000 Units by mouth daily., Disp: , Rfl:  .  famotidine (PEPCID) 20 MG tablet, Take 1 tablet (20 mg total) by mouth at bedtime., Disp: 90 tablet, Rfl: 4 .  ferrous sulfate 325 (65 FE) MG tablet, Take 650 mg by mouth daily with breakfast. , Disp: , Rfl:  .  fesoterodine (TOVIAZ) 4 MG TB24 tablet, Take 4 mg by mouth daily., Disp: , Rfl:  .  finasteride (PROSCAR) 5 MG tablet, TAKE 1 TABLET (5 MG TOTAL) BY MOUTH DAILY. (Patient taking  differently: Take 5 mg by mouth daily. ), Disp: 90 tablet, Rfl: 1 .  furosemide (LASIX) 20 MG tablet, Take 1 tablet (20 mg total) by mouth daily. Take 2 tabs 5 times weekly. (Patient taking differently: Take 20 mg by mouth See admin instructions. Take 1 tabs 5 times weekly (Sunday, Monday, Tuesday, Thursday, and Saturday)), Disp: 30 tablet, Rfl: 3 .  glucose blood test strip, Use as instructed to test blood sugars 4 times daily, Disp: 150 each, Rfl: 12 .  insulin aspart (NOVOLOG) 100 UNIT/ML injection, TAKE 8 UNITS UNDER THE SKIN DAILY Monday-Friday AND ON Saturday AND Sunday TAKE 6 UNITS AT BREAKFAST AND LUNCH, AND 8 UNITS AT DINNER. (Patient taking differently: TAKE 7 UNITS IN THE MORNING, 6 UNITS AT LUNCH, AND 8 UNITS AT SUPPER.), Disp: 50 mL, Rfl: 2 .  insulin glargine (LANTUS) 100 UNIT/ML injection, INJECT 24 UNITS SUBCUTANEOUSLY IN THE MORNING AND 20 UNITS IN THE EVENING (Patient taking differently: Inject 24 Units into the skin every morning. TAKE 24 UNITS EVERY MORNING.), Disp: 20 mL, Rfl: 0 .  Insulin Syringe-Needle U-100 (BD INSULIN SYRINGE U/F) 31G X 5/16" 0.3 ML MISC, USE THREE TIMES DAILY, Disp: 270 each, Rfl: 1 .  isosorbide mononitrate (IMDUR) 30 MG 24 hr tablet, Take 1 tablet (30 mg total) by mouth daily., Disp: 30 tablet, Rfl: 11 .  Melatonin 5 MG TABS, Take 2 tablets by mouth at bedtime. , Disp: , Rfl:  .  Multiple Vitamin (MULTIVITAMIN WITH MINERALS) TABS tablet, Take 1 tablet by mouth daily., Disp: , Rfl:  .  omeprazole (PRILOSEC) 20 MG capsule, Take 20 mg by mouth daily., Disp: , Rfl:  .  ticagrelor (BRILINTA) 90 MG TABS tablet, Take 1 tablet (90 mg total) by mouth 2 (two) times daily., Disp: 180 tablet, Rfl: 3  Past Medical History: Past Medical History:  Diagnosis Date  . Anemia   . BPH (benign prostatic hyperplasia)   . CAD (coronary artery disease)    hx of stemi  04-2018   . Chronic renal failure   . Chronic renal insufficiency   . COPD (chronic obstructive pulmonary  disease) ([place])   . DDD (degenerative disc disease), lumbar   . Diabetes mellitus   . Hyperlipidemia   . Hyperparathyroidism ([place])   . Hypertension   . IDA (iron deficiency anemia)   . Neuromuscular disorder ([place])   . OSA on CPAP   . Osteomyelitis of forearm ([place])   . Osteoporosis     Tobacco Use: Social History   Tobacco Use  Smoking Status Former Smoker  . Packs/day: 3.00  . Years: 33.00  . Pack years: 99.00  . Types: Cigarettes  . Last attempt to quit: 06/19/1973  . Years since quitting: 45.0  Smokeless Tobacco Former Systems developer  . Quit date: 09/26/1975    Labs: Recent Review Flowsheet Data    Labs for ITP Cardiac and Pulmonary Rehab Latest Ref Rng & Units 10/03/2017 02/07/2018 04/20/2018 04/21/2018 05/09/2018   Cholestrol 0 - 200 mg/dL - - 121 - 108   LDLCALC 0 - 99 mg/dL - - 62 - 56   HDL >39.00 mg/dL - - 37(L) - 35.30(L)   Trlycerides 0.0 - 149.0 mg/dL - - 111 - 83.0   Hemoglobin A1c 4.6 - 6.5 % 6.4 6.8(H) - 6.3(H) 6.6(H)   PHART 7.350 - 7.450 - - - - -   PCO2ART 35.0 - 45.0 mmHg - - - - -   HCO3 20.0 - 24.0 mEq/L - - - - -   TCO2 22 - 32 mmol/L - - 20(L) - -   ACIDBASEDEF 0.0 - 2.0 mmol/L - - - - -   O2SAT % - - - - -      Capillary Blood Glucose: Lab Results  Component Value Date   GLUCAP 88 04/26/2018   GLUCAP 118 (H) 04/25/2018   GLUCAP 138 (H) 04/25/2018   GLUCAP 180 (H) 04/25/2018   GLUCAP 114 (H) 04/25/2018     Exercise Target Goals: Exercise Program Goal: Individual exercise prescription set using results from initial 6 min walk test and THRR while considering  patient's activity barriers and safety.   Exercise Prescription Goal: Initial exercise prescription builds to 30-45 minutes a day of aerobic activity, 2-3 days per week.  Home exercise guidelines will be given to patient during program as part of exercise prescription that the participant will acknowledge.  Activity Barriers & Risk Stratification: Activity Barriers & Cardiac Risk Stratification -  07/03/18 1023      Activity Barriers & Cardiac Risk Stratification   Activity Barriers  Muscular Weakness;Deconditioning;Balance Concerns;History of Falls;Assistive Device    Cardiac Risk Stratification  High       6 Minute Walk: 6 Minute Walk    Row Name 07/03/18 1021         6 Minute Walk   Phase  Initial     Distance  600 feet     Walk Time  6 minutes     #  of Rest Breaks  1     MPH  1.1     METS  0.8     RPE  13     VO2 Peak  2.84     Symptoms  Yes (comment)     Comments  Fatigue     Resting HR  98 bpm     Resting BP  120/70     Resting Oxygen Saturation   97 %     Exercise Oxygen Saturation  during 6 min walk  97 %     Max Ex. HR  112 bpm     Max Ex. BP  124/74     2 Minute Post BP  108/56        Oxygen Initial Assessment:   Oxygen Re-Evaluation:   Oxygen Discharge (Final Oxygen Re-Evaluation):   Initial Exercise Prescription: Initial Exercise Prescription - 07/03/18 1000      Date of Initial Exercise RX and Referring Provider   Date  07/03/18    Referring Provider  Dr. Ellyn Hack    Expected Discharge Date  10/10/18      NuStep   Level  2    SPM  75    Minutes  20    METs  0.8      Arm Ergometer   Level  1    Watts  8    Minutes  10    METs  1.6      Prescription Details   Frequency (times per week)  3    Duration  Progress to 30 minutes of continuous aerobic without signs/symptoms of physical distress      Intensity   THRR 40-80% of Max Heartrate  53-106    Ratings of Perceived Exertion  11-13      Progression   Progression  Continue to progress workloads to maintain intensity without signs/symptoms of physical distress.      Resistance Training   Training Prescription  Yes    Weight  2 lbs    Reps  10-15       Perform Capillary Blood Glucose checks as needed.  Exercise Prescription Changes:   Exercise Comments:   Exercise Goals and Review:  Exercise Goals    Row Name 07/03/18 1023             Exercise Goals    Increase Physical Activity  Yes       Intervention  Provide advice, education, support and counseling about physical activity/exercise needs.;Develop an individualized exercise prescription for aerobic and resistive training based on initial evaluation findings, risk stratification, comorbidities and participant's personal goals.       Expected Outcomes  Short Term: Attend rehab on a regular basis to increase amount of physical activity.       Increase Strength and Stamina  Yes       Intervention  Provide advice, education, support and counseling about physical activity/exercise needs.;Develop an individualized exercise prescription for aerobic and resistive training based on initial evaluation findings, risk stratification, comorbidities and participant's personal goals.       Expected Outcomes  Short Term: Increase workloads from initial exercise prescription for resistance, speed, and METs.       Able to understand and use rate of perceived exertion (RPE) scale  Yes       Intervention  Provide education and explanation on how to use RPE scale       Expected Outcomes  Short Term: Able to use RPE daily in rehab to  express subjective intensity level;Long Term:  Able to use RPE to guide intensity level when exercising independently       Knowledge and understanding of Target Heart Rate Range (THRR)  Yes       Intervention  Provide education and explanation of THRR including how the numbers were predicted and where they are located for reference       Expected Outcomes  Short Term: Able to state/look up THRR;Long Term: Able to use THRR to govern intensity when exercising independently;Short Term: Able to use daily as guideline for intensity in rehab       Able to check pulse independently  Yes       Intervention  Provide education and demonstration on how to check pulse in carotid and radial arteries.;Review the importance of being able to check your own pulse for safety during independent exercise        Expected Outcomes  Short Term: Able to explain why pulse checking is important during independent exercise;Long Term: Able to check pulse independently and accurately       Understanding of Exercise Prescription  Yes       Intervention  Provide education, explanation, and written materials on patient's individual exercise prescription       Expected Outcomes  Short Term: Able to explain program exercise prescription;Long Term: Able to explain home exercise prescription to exercise independently          Exercise Goals Re-Evaluation :   Discharge Exercise Prescription (Final Exercise Prescription Changes):   Nutrition:  Target Goals: Understanding of nutrition guidelines, daily intake of sodium 1500mg , cholesterol 200mg , calories 30% from fat and 7% or less from saturated fats, daily to have 5 or more servings of fruits and vegetables.  Biometrics: Pre Biometrics - 07/03/18 1022      Pre Biometrics   Height  5\' 10"  (1.778 m)    Weight  86.7 kg    Waist Circumference  45 inches    Hip Circumference  43 inches    Waist to Hip Ratio  1.05 %    BMI (Calculated)  27.43    Triceps Skinfold  23 mm    % Body Fat  32.1 %    Grip Strength  16 kg    Flexibility  0 in    Single Leg Stand  0 seconds        Nutrition Therapy Plan and Nutrition Goals: Nutrition Therapy & Goals - 07/03/18 1125      Nutrition Therapy   Diet  carb modified      Personal Nutrition Goals   Nutrition Goal  pt able to name foods that affect blood glucose      Intervention Plan   Intervention  Prescribe, educate and counsel regarding individualized specific dietary modifications aiming towards targeted core components such as weight, hypertension, lipid management, diabetes, heart failure and other comorbidities.    Expected Outcomes  Short Term Goal: Understand basic principles of dietary content, such as calories, fat, sodium, cholesterol and nutrients.;Long Term Goal: Adherence to prescribed nutrition  plan.       Nutrition Assessments: Nutrition Assessments - 07/03/18 1127      MEDFICTS Scores   Pre Score  18       Nutrition Goals Re-Evaluation: Nutrition Goals Re-Evaluation    [place] Name 07/03/18 1125             Goals   Current Weight  191 lb 2.2 oz (86.7 kg)  Nutrition Goals Re-Evaluation: Nutrition Goals Re-Evaluation    [place] Name 07/03/18 1125             Goals   Current Weight  191 lb 2.2 oz (86.7 kg)          Nutrition Goals Discharge (Final Nutrition Goals Re-Evaluation): Nutrition Goals Re-Evaluation - 07/03/18 1125      Goals   Current Weight  191 lb 2.2 oz (86.7 kg)       Psychosocial: Target Goals: Acknowledge presence or absence of significant depression and/or stress, maximize coping skills, provide positive support system. Participant is able to verbalize types and ability to use techniques and skills needed for reducing stress and depression.  Initial Review & Psychosocial Screening: Initial Psych Review & Screening - 07/03/18 1123      Initial Review   Current issues with  None Identified      Family Dynamics   Good Support System?  Yes   Rush Landmark has his [name] for support     Barriers   Psychosocial barriers to participate in program  There are no identifiable barriers or psychosocial needs.      Screening Interventions   Interventions  Encouraged to exercise       Quality of Life Scores: Quality of Life - 07/03/18 1029      Quality of Life   Select  Quality of Life      Quality of Life Scores   Health/Function Pre  28.83 %    Socioeconomic Pre  30 %    Psych/Spiritual Pre  30 %    Family Pre  30 %    GLOBAL Pre  29.44 %      Scores of 19 and below usually indicate a poorer quality of life in these areas.  A difference of  2-3 points is a clinically meaningful difference.  A difference of 2-3 points in the total score of the Quality of Life Index has been associated with significant improvement in overall  quality of life, self-image, physical symptoms, and general health in studies assessing change in quality of life.  PHQ-9: Recent Review Flowsheet Data    There is no flowsheet data to display.     Interpretation of Total Score  Total Score Depression Severity:  1-4 = Minimal depression, 5-9 = Mild depression, 10-14 = Moderate depression, 15-19 = Moderately severe depression, 20-27 = Severe depression   Psychosocial Evaluation and Intervention:   Psychosocial Re-Evaluation:   Psychosocial Discharge (Final Psychosocial Re-Evaluation):   Vocational Rehabilitation: Provide vocational rehab assistance to qualifying candidates.   Vocational Rehab Evaluation & Intervention: Vocational Rehab - 07/03/18 1128      Initial Vocational Rehab Evaluation & Intervention   Assessment shows need for Vocational Rehabilitation  No   Mr Eves is retired and does not need vocational rehab at this time      Education: Education Goals: Education classes will be provided on a weekly basis, covering required topics. Participant will state understanding/return demonstration of topics presented.  Learning Barriers/Preferences: Learning Barriers/Preferences - 07/03/18 1024      Learning Barriers/Preferences   Learning Barriers  Sight;Hearing    Learning Preferences  Audio;Verbal Instruction       Education Topics: Count Your Pulse:  -Group instruction provided by verbal instruction, demonstration, patient participation and written materials to support subject.  Instructors address importance of being able to find your pulse and how to count your pulse when at home without a heart monitor.  Patients get hands  on experience counting their pulse with staff help and individually.   Heart Attack, Angina, and Risk Factor Modification:  -Group instruction provided by verbal instruction, video, and written materials to support subject.  Instructors address signs and symptoms of angina and heart  attacks.    Also discuss risk factors for heart disease and how to make changes to improve heart health risk factors.   Functional Fitness:  -Group instruction provided by verbal instruction, demonstration, patient participation, and written materials to support subject.  Instructors address safety measures for doing things around the house.  Discuss how to get up and down off the floor, how to pick things up properly, how to safely get out of a chair without assistance, and balance training.   Meditation and Mindfulness:  -Group instruction provided by verbal instruction, patient participation, and written materials to support subject.  Instructor addresses importance of mindfulness and meditation practice to help reduce stress and improve awareness.  Instructor also leads participants through a meditation exercise.    Stretching for Flexibility and Mobility:  -Group instruction provided by verbal instruction, patient participation, and written materials to support subject.  Instructors lead participants through series of stretches that are designed to increase flexibility thus improving mobility.  These stretches are additional exercise for major muscle groups that are typically performed during regular warm up and cool down.   Hands Only CPR:  -Group verbal, video, and participation provides a basic overview of AHA guidelines for community CPR. Role-play of emergencies allow participants the opportunity to practice calling for help and chest compression technique with discussion of AED use.   Hypertension: -Group verbal and written instruction that provides a basic overview of hypertension including the most recent diagnostic guidelines, risk factor reduction with self-care instructions and medication management.    Nutrition I class: Heart Healthy Eating:  -Group instruction provided by PowerPoint slides, verbal discussion, and written materials to support subject matter. The instructor  gives an explanation and review of the Therapeutic Lifestyle Changes diet recommendations, which includes a discussion on lipid goals, dietary fat, sodium, fiber, plant stanol/sterol esters, sugar, and the components of a well-balanced, healthy diet.   Nutrition II class: Lifestyle Skills:  -Group instruction provided by PowerPoint slides, verbal discussion, and written materials to support subject matter. The instructor gives an explanation and review of label reading, grocery shopping for heart health, heart healthy recipe modifications, and ways to make healthier choices when eating out.   Diabetes Question & Answer:  -Group instruction provided by PowerPoint slides, verbal discussion, and written materials to support subject matter. The instructor gives an explanation and review of diabetes co-morbidities, pre- and post-prandial blood glucose goals, pre-exercise blood glucose goals, signs, symptoms, and treatment of hypoglycemia and hyperglycemia, and foot care basics.   Diabetes Blitz:  -Group instruction provided by PowerPoint slides, verbal discussion, and written materials to support subject matter. The instructor gives an explanation and review of the physiology behind type 1 and type 2 diabetes, diabetes medications and rational behind using different medications, pre- and post-prandial blood glucose recommendations and Hemoglobin A1c goals, diabetes diet, and exercise including blood glucose guidelines for exercising safely.    Portion Distortion:  -Group instruction provided by PowerPoint slides, verbal discussion, written materials, and food models to support subject matter. The instructor gives an explanation of serving size versus portion size, changes in portions sizes over the last 20 years, and what consists of a serving from each food group.   Stress Management:  -Group instruction provided  by verbal instruction, video, and written materials to support subject matter.   Instructors review role of stress in heart disease and how to cope with stress positively.     Exercising on Your Own:  -Group instruction provided by verbal instruction, power point, and written materials to support subject.  Instructors discuss benefits of exercise, components of exercise, frequency and intensity of exercise, and end points for exercise.  Also discuss use of nitroglycerin and activating EMS.  Review options of places to exercise outside of rehab.  Review guidelines for sex with heart disease.   Cardiac Drugs I:  -Group instruction provided by verbal instruction and written materials to support subject.  Instructor reviews cardiac drug classes: antiplatelets, anticoagulants, beta blockers, and statins.  Instructor discusses reasons, side effects, and lifestyle considerations for each drug class.   Cardiac Drugs II:  -Group instruction provided by verbal instruction and written materials to support subject.  Instructor reviews cardiac drug classes: angiotensin converting enzyme inhibitors (ACE-I), angiotensin II receptor blockers (ARBs), nitrates, and calcium channel blockers.  Instructor discusses reasons, side effects, and lifestyle considerations for each drug class.   Anatomy and Physiology of the Circulatory System:  Group verbal and written instruction and models provide basic cardiac anatomy and physiology, with the coronary electrical and arterial systems. Review of: AMI, Angina, Valve disease, Heart Failure, Peripheral Artery Disease, Cardiac Arrhythmia, Pacemakers, and the ICD.   Other Education:  -Group or individual verbal, written, or video instructions that support the educational goals of the cardiac rehab program.   Holiday Eating Survival Tips:  -Group instruction provided by PowerPoint slides, verbal discussion, and written materials to support subject matter. The instructor gives patients tips, tricks, and techniques to help them not only survive but enjoy  the holidays despite the onslaught of food that accompanies the holidays.   Knowledge Questionnaire Score: Knowledge Questionnaire Score - 07/03/18 1025      Knowledge Questionnaire Score   Pre Score  16/24       Core Components/Risk Factors/Patient Goals at Admission: Personal Goals and Risk Factors at Admission - 07/03/18 1025      Core Components/Risk Factors/Patient Goals on Admission    Weight Management  Yes;Obesity;Weight Maintenance;Weight Loss    Intervention  Weight Management: Develop a combined nutrition and exercise program designed to reach desired caloric intake, while maintaining appropriate intake of nutrient and fiber, sodium and fats, and appropriate energy expenditure required for the weight goal.;Weight Management: Provide education and appropriate resources to help participant work on and attain dietary goals.;Weight Management/Obesity: Establish reasonable short term and long term weight goals.;Obesity: Provide education and appropriate resources to help participant work on and attain dietary goals.    Admit Weight  191 lb 2.2 oz (86.7 kg)    Expected Outcomes  Short Term: Continue to assess and modify interventions until short term weight is achieved;Long Term: Adherence to nutrition and physical activity/exercise program aimed toward attainment of established weight goal;Weight Maintenance: Understanding of the daily nutrition guidelines, which includes 25-35% calories from fat, 7% or less cal from saturated fats, less than 200mg  cholesterol, less than 1.5gm of sodium, & 5 or more servings of fruits and vegetables daily;Weight Loss: Understanding of general recommendations for a balanced deficit meal plan, which promotes 1-2 lb weight loss per week and includes a negative energy balance of [phone] kcal/d;Understanding recommendations for meals to include 15-35% energy as protein, 25-35% energy from fat, 35-60% energy from carbohydrates, less than 200mg  of dietary  cholesterol, 20-35 gm of total  fiber daily;Understanding of distribution of calorie intake throughout the day with the consumption of 4-5 meals/snacks    Diabetes  Yes    Intervention  Provide education about signs/symptoms and action to take for hypo/hyperglycemia.;Provide education about proper nutrition, including hydration, and aerobic/resistive exercise prescription along with prescribed medications to achieve blood glucose in normal ranges: Fasting glucose 65-99 mg/dL    Expected Outcomes  Short Term: Participant verbalizes understanding of the signs/symptoms and immediate care of hyper/hypoglycemia, proper foot care and importance of medication, aerobic/resistive exercise and nutrition plan for blood glucose control.;Long Term: Attainment of HbA1C < 7%.    Hypertension  Yes    Intervention  Provide education on lifestyle modifcations including regular physical activity/exercise, weight management, moderate sodium restriction and increased consumption of fresh fruit, vegetables, and low fat dairy, alcohol moderation, and smoking cessation.;Monitor prescription use compliance.    Expected Outcomes  Short Term: Continued assessment and intervention until BP is < 140/81mm HG in hypertensive participants. < 130/66mm HG in hypertensive participants with diabetes, heart failure or chronic kidney disease.;Long Term: Maintenance of blood pressure at goal levels.    Lipids  Yes    Intervention  Provide education and support for participant on nutrition & aerobic/resistive exercise along with prescribed medications to achieve LDL 70mg , HDL >40mg .    Expected Outcomes  Short Term: Participant states understanding of desired cholesterol values and is compliant with medications prescribed. Participant is following exercise prescription and nutrition guidelines.;Long Term: Cholesterol controlled with medications as prescribed, with individualized exercise RX and with personalized nutrition plan. Value goals: LDL <  70mg , HDL > 40 mg.    Stress  Yes    Intervention  Offer individual and/or small group education and counseling on adjustment to heart disease, stress management and health-related lifestyle change. Teach and support self-help strategies.;Refer participants experiencing significant psychosocial distress to appropriate mental health specialists for further evaluation and treatment. When possible, include family members and significant others in education/counseling sessions.    Expected Outcomes  Short Term: Participant demonstrates changes in health-related behavior, relaxation and other stress management skills, ability to obtain effective social support, and compliance with psychotropic medications if prescribed.;Long Term: Emotional wellbeing is indicated by absence of clinically significant psychosocial distress or social isolation.       Core Components/Risk Factors/Patient Goals Review:    Core Components/Risk Factors/Patient Goals at Discharge (Final Review):    ITP Comments: ITP Comments    Row Name 07/03/18 1019           ITP Comments  Dr. Fransico Him, Medical Director          Comments: Rush Landmark attended orientation from [place] to 1025 to review rules and guidelines for program. Completed 6 minute walk test at 5 minutes 40 seconds due to fatigue and general deconditioning otherwise, patient asymptomatic Intitial ITP, and exercise prescription.  VSS. Telemetry-Sinus Rhythm with a first degree heart block. This has been previously documented Bill uses a rolling walker for stability.Barnet Pall, RN,BSN 07/03/2018 11:48 AM

## 2018-07-03 NOTE — Progress Notes (Addendum)
Clayton [name] 85 y.o. male DOB: 04-Dec-1927 MRN: [id number]      Nutrition Note  1. ST elevation myocardial infarction involving right coronary artery ([place]), 04/20/18   2. Status post coronary artery stent placement, s/p DES RCA 04/20/18    Past Medical History:  Diagnosis Date  . Anemia   . BPH (benign prostatic hyperplasia)   . CAD (coronary artery disease)    hx of stemi  04-2018   . Chronic renal failure   . Chronic renal insufficiency   . COPD (chronic obstructive pulmonary disease) ([place])   . DDD (degenerative disc disease), lumbar   . Diabetes mellitus   . Hyperlipidemia   . Hyperparathyroidism ([place])   . Hypertension   . IDA (iron deficiency anemia)   . Neuromuscular disorder ([place])   . OSA on CPAP   . Osteomyelitis of forearm ([place])   . Osteoporosis    Meds reviewed.   Current Outpatient Medications (Endocrine & Metabolic):  .  insulin aspart (NOVOLOG) 100 UNIT/ML injection, TAKE 8 UNITS UNDER THE SKIN DAILY Monday-Friday AND ON Saturday AND Sunday TAKE 6 UNITS AT BREAKFAST AND LUNCH, AND 8 UNITS AT DINNER. (Patient taking differently: TAKE 7 UNITS IN THE MORNING, 6 UNITS AT LUNCH, AND 8 UNITS AT SUPPER.) .  insulin glargine (LANTUS) 100 UNIT/ML injection, INJECT 24 UNITS SUBCUTANEOUSLY IN THE MORNING AND 20 UNITS IN THE EVENING (Patient taking differently: Inject 24 Units into the skin every morning. TAKE 24 UNITS EVERY MORNING.)  Current Outpatient Medications (Cardiovascular):  .  atorvastatin (LIPITOR) 80 MG tablet, Take 1 tablet (80 mg total) by mouth daily. .  furosemide (LASIX) 20 MG tablet, Take 1 tablet (20 mg total) by mouth daily. Take 2 tabs 5 times weekly. (Patient taking differently: Take 20 mg by mouth See admin instructions. Take 1 tabs 5 times weekly (Sunday, Monday, Tuesday, Thursday, and Saturday)) .  isosorbide mononitrate (IMDUR) 30 MG 24 hr tablet, Take 1 tablet (30 mg total) by mouth daily.  Current Outpatient Medications (Respiratory):  .  albuterol  (PROVENTIL HFA;VENTOLIN HFA) 108 (90 Base) MCG/ACT inhaler, Inhale 1-2 puffs into the lungs every 6 (six) hours as needed for wheezing or shortness of breath. .  budesonide-formoterol (SYMBICORT) 160-4.5 MCG/ACT inhaler, Inhale 2 puffs into the lungs 2 (two) times daily.  Current Outpatient Medications (Analgesics):  .  aspirin 81 MG chewable tablet, Chew 1 tablet (81 mg total) by mouth daily.  Current Outpatient Medications (Hematological):  .  ferrous sulfate 325 (65 FE) MG tablet, Take 650 mg by mouth daily with breakfast.  .  ticagrelor (BRILINTA) 90 MG TABS tablet, Take 1 tablet (90 mg total) by mouth 2 (two) times daily.  Current Outpatient Medications (Other):  Marland Kitchen  ACCU-CHEK SOFTCLIX LANCETS lancets, TEST AS INSTRUCTED TO OBTAIN A BLOOD SPECIMEN 4 TIMES DAILY .  Alcohol Swabs (B-D SINGLE USE SWABS REGULAR) PADS, USE 7 SWABS DAILY AS DIRECTED .  cholecalciferol (VITAMIN D) 1000 units tablet, Take 2,000 Units by mouth daily. .  famotidine (PEPCID) 20 MG tablet, Take 1 tablet (20 mg total) by mouth at bedtime. .  fesoterodine (TOVIAZ) 4 MG TB24 tablet, Take 4 mg by mouth daily. .  finasteride (PROSCAR) 5 MG tablet, TAKE 1 TABLET (5 MG TOTAL) BY MOUTH DAILY. (Patient taking differently: Take 5 mg by mouth daily. ) .  glucose blood test strip, Use as instructed to test blood sugars 4 times daily .  Insulin Syringe-Needle U-100 (BD INSULIN SYRINGE U/F) 31G X 5/16" 0.3 ML  MISC, USE THREE TIMES DAILY .  Melatonin 5 MG TABS, Take 2 tablets by mouth at bedtime.  .  Multiple Vitamin (MULTIVITAMIN WITH MINERALS) TABS tablet, Take 1 tablet by mouth daily. Marland Kitchen  omeprazole (PRILOSEC) 20 MG capsule, Take 20 mg by mouth daily.   HT: Ht Readings from Last 1 Encounters:  07/03/18 5\' 10"  (1.778 m)    WT: Wt Readings from Last 5 Encounters:  07/03/18 191 lb 2.2 oz (86.7 kg)  06/13/18 197 lb (89.4 kg)  05/13/18 189 lb (85.7 kg)  05/08/18 191 lb (86.6 kg)  04/26/18 198 lb 11.2 oz (90.1 kg)     Body  mass index is 27.43 kg/m.   Current tobacco use? No  Labs:  Lipid Panel     Component Value Date/Time   CHOL 108 05/09/2018 0820   TRIG 83.0 05/09/2018 0820   HDL 35.30 (L) 05/09/2018 0820   CHOLHDL 3 05/09/2018 0820   VLDL 16.6 05/09/2018 0820   LDLCALC 56 05/09/2018 0820    Lab Results  Component Value Date   HGBA1C 6.6 (H) 05/09/2018   CBG (last 3)  No results for input(s): GLUCAP in the last 72 hours.  Nutrition Note Spoke with pt. Nutrition plan and goals reviewed with pt. Pt is following Step 2 of the Therapeutic Lifestyle Changes diet. Discussed pt continuing to follow carb modified age appropriate diet. Pt wants to maintain weight, and fuel his activity with nutrition. Heart healthy diabetic eating tips reviewed (label reading, how to build a healthy plate, portion sizes, eating frequently across the day). Pt has Type 2 Diabetes. Last A1c indicates blood glucose well-controlled. This Probation officer went over Diabetes Education test results. Pt checks CBG's 3-4 times a day. Fasting CBG's reportedly 110-145 mg/dL. Has previously been having higher than usual CBG's d/t being on steroids and having a course of antibiotics. Pt expressed he feels his blood sugars are just now coming back into a normal range. Last high pt had was in December when CBG's spiked to 240's. Has not occurred in the last 2 weeks. Per discussion, pt does not use canned/convenience foods often. Pt does not add salt to food. Pt does not eat out frequently. Pt expressed understanding of the information reviewed. Pt aware of nutrition education classes offered and would like to attend nutrition classes.  Nutrition Diagnosis ? Food-and nutrition-related knowledge deficit related to lack of exposure to information as related to diagnosis of: ? CVD ? Type 2 Diabetes  Nutrition Intervention ? Pt's individual nutrition plan and goals reviewed with pt. ? Pt given handouts for: ? Nutrition I class ? Nutrition II class  ?  Diabetes Blitz Class ? Diabetes Q & A class   Nutrition Goal(s):  ? Pt able to name foods that affect blood glucose.  Plan:  ? Pt to attend nutrition classes ? Nutrition I ? Nutrition II ? Portion Distortion  ? Diabetes Blitz ? Diabetes Q & Ae determined ? Will provide client-centered nutrition education as part of interdisciplinary care ? Monitor and evaluate progress toward nutrition goal with team.  Laurina Bustle, MS, RD, LDN 07/03/2018 11:19 AM

## 2018-07-07 ENCOUNTER — Ambulatory Visit (HOSPITAL_COMMUNITY): Payer: Medicare PPO

## 2018-07-07 ENCOUNTER — Encounter (HOSPITAL_COMMUNITY)
Admission: RE | Admit: 2018-07-07 | Discharge: 2018-07-07 | Disposition: A | Discharge status: Home / Self Care | Payer: Medicare PPO | Source: Ambulatory Visit | Attending: Cardiology | Admitting: Cardiology

## 2018-07-07 ENCOUNTER — Encounter (HOSPITAL_COMMUNITY): Payer: Self-pay

## 2018-07-07 DIAGNOSIS — Z955 Presence of coronary angioplasty implant and graft: Secondary | ICD-10-CM

## 2018-07-07 DIAGNOSIS — I2111 ST elevation (STEMI) myocardial infarction involving right coronary artery: Secondary | ICD-10-CM

## 2018-07-07 LAB — GLUCOSE, CAPILLARY
GLUCOSE-CAPILLARY: 108 mg/dL — AB (ref 70–99)
Glucose-Capillary: 158 mg/dL — ABNORMAL HIGH (ref 70–99)

## 2018-07-07 NOTE — Progress Notes (Signed)
Daily Session Note  Patient Details  Name: Clayton [name] MRN: [id number] Date of Birth: 1927-10-02 Referring Provider:   Flowsheet Row CARDIAC REHAB PHASE II ORIENTATION from 07/03/2018 in [facility]  Referring Provider  Dr. Ellyn Hack      Encounter Date: 07/07/2018  Check In: Session Check In - 07/07/18 0840    Check-In          Supervising physician immediately available to respond to emergencies  [facility] immediately available    Physician(s)  Dr. Tana Coast    Location  [facility]    Staff Present  Barnet Pall, RN, BSN;Nani Ingram, RN, Mosie Epstein, MS,ACSM CEP, Exercise Physiologist;Brittany Durene Fruits, BS, ACSM CEP, Exercise Physiologist    Medication changes reported      No    Fall or balance concerns reported     No    Tobacco Cessation  No Change    Warm-up and Cool-down  Performed as group-led instruction    Resistance Training Performed  Yes    VAD Patient?  No    PAD/SET Patient?  No        Pain Assessment          Currently in Pain?  No/denies    Pain Score  0-No pain    Multiple Pain Sites  No           Capillary Blood Glucose: Results for orders placed or performed during the hospital encounter of 07/07/18 (from the past 24 hour(s))  Glucose, capillary     Status: Abnormal   Collection Time: 07/07/18  8:22 AM  Result Value Ref Range   Glucose-Capillary 158 (H) 70 - 99 mg/dL  Glucose, capillary     Status: Abnormal   Collection Time: 07/07/18  9:21 AM  Result Value Ref Range   Glucose-Capillary 108 (H) 70 - 99 mg/dL    Exercise Prescription Changes - 07/07/18 0900    Response to Exercise          Blood Pressure (Admit)  102/60    Blood Pressure (Exercise)  118/64    Blood Pressure (Exit)  102/68    Heart Rate (Admit)  99 bpm    Heart Rate (Exercise)  114 bpm    Heart Rate (Exit)  98 bpm    Rating of Perceived Exertion (Exercise)  13    Comments  Pt first day of exercise.    Duration   Progress to 30 minutes of  aerobic without signs/symptoms of physical distress    Intensity  THRR unchanged        Progression          Progression  Continue to progress workloads to maintain intensity without signs/symptoms of physical distress.    Average METs  1.4        Resistance Training          Weight  2 lbs    Reps  10-15    Time  10 Minutes        NuStep          Level  2    SPM  75    Minutes  20    METs  1.3        Arm Ergometer          Level  1    Watts  29    Minutes  10    METs  1.48  Social History   Tobacco Use  Smoking Status Former Smoker  . Packs/day: 3.00  . Years: 33.00  . Pack years: 99.00  . Types: Cigarettes  . Last attempt to quit: 06/19/1973  . Years since quitting: 45.0  Smokeless Tobacco Former Systems developer  . Quit date: 09/26/1975    Goals Met:  Exercise tolerated well  Goals Unmet:  Not Applicable  Comments: Pt started cardiac rehab today.  Pt tolerated light exercise without difficulty. VSS, telemetry-sinus rhythm,  asymptomatic.  Medication list reconciled. Pt denies barriers to medicaiton compliance.  PSYCHOSOCIAL ASSESSMENT:  PHQ-0.  Pt exhibits positive coping skills, hopeful outlook with supportive family. No psychosocial needs identified at this time, no psychosocial interventions necessary.    Pt oriented to exercise equipment and routine.    Understanding verbalized.  Andi Hence, RN, BSN Cardiac Pulmonary Rehab     Dr. Fransico Him is Medical Director for Cardiac Rehab at [facility].

## 2018-07-09 ENCOUNTER — Ambulatory Visit (HOSPITAL_COMMUNITY): Payer: Medicare PPO

## 2018-07-09 ENCOUNTER — Encounter (HOSPITAL_COMMUNITY)
Admission: RE | Admit: 2018-07-09 | Discharge: 2018-07-09 | Disposition: A | Discharge status: Home / Self Care | Payer: Medicare PPO | Source: Ambulatory Visit | Attending: Cardiology | Admitting: Cardiology

## 2018-07-09 DIAGNOSIS — Z955 Presence of coronary angioplasty implant and graft: Secondary | ICD-10-CM

## 2018-07-09 DIAGNOSIS — I2111 ST elevation (STEMI) myocardial infarction involving right coronary artery: Secondary | ICD-10-CM

## 2018-07-09 LAB — GLUCOSE, CAPILLARY
Glucose-Capillary: 118 mg/dL — ABNORMAL HIGH (ref 70–99)
Glucose-Capillary: 156 mg/dL — ABNORMAL HIGH (ref 70–99)

## 2018-07-10 ENCOUNTER — Ambulatory Visit: Payer: Medicare PPO | Admitting: Gastroenterology

## 2018-07-10 ENCOUNTER — Encounter: Payer: Self-pay | Admitting: Gastroenterology

## 2018-07-10 ENCOUNTER — Other Ambulatory Visit (INDEPENDENT_AMBULATORY_CARE_PROVIDER_SITE_OTHER): Payer: Medicare PPO

## 2018-07-10 VITALS — BP 120/54 | HR 82 | Ht 70.0 in | Wt 194.1 lb

## 2018-07-10 DIAGNOSIS — I25118 Atherosclerotic heart disease of native coronary artery with other forms of angina pectoris: Secondary | ICD-10-CM

## 2018-07-10 DIAGNOSIS — Z794 Long term (current) use of insulin: Secondary | ICD-10-CM

## 2018-07-10 DIAGNOSIS — E1165 Type 2 diabetes mellitus with hyperglycemia: Secondary | ICD-10-CM | POA: Diagnosis not present

## 2018-07-10 DIAGNOSIS — N184 Chronic kidney disease, stage 4 (severe): Secondary | ICD-10-CM | POA: Diagnosis not present

## 2018-07-10 DIAGNOSIS — D508 Other iron deficiency anemias: Secondary | ICD-10-CM

## 2018-07-10 LAB — CBC WITH DIFFERENTIAL/PLATELET
BASOS PCT: 0.8 % (ref 0.0–3.0)
Basophils Absolute: 0.1 10*3/uL (ref 0.0–0.1)
Eosinophils Absolute: 0.7 10*3/uL (ref 0.0–0.7)
Eosinophils Relative: 5.5 % — ABNORMAL HIGH (ref 0.0–5.0)
HCT: 26.4 % — ABNORMAL LOW (ref 39.0–52.0)
Hemoglobin: 8.6 g/dL — ABNORMAL LOW (ref 13.0–17.0)
Lymphocytes Relative: 11.3 % — ABNORMAL LOW (ref 12.0–46.0)
Lymphs Abs: 1.4 10*3/uL (ref 0.7–4.0)
MCHC: 32.5 g/dL (ref 30.0–36.0)
MCV: 96.3 fl (ref 78.0–100.0)
Monocytes Absolute: 1 10*3/uL (ref 0.1–1.0)
Monocytes Relative: 8.4 % (ref 3.0–12.0)
Neutro Abs: 8.9 10*3/uL — ABNORMAL HIGH (ref 1.4–7.7)
Neutrophils Relative %: 74 % (ref 43.0–77.0)
PLATELETS: 394 10*3/uL (ref 150.0–400.0)
RBC: 2.74 Mil/uL — ABNORMAL LOW (ref 4.22–5.81)
RDW: 15.7 % — AB (ref 11.5–15.5)
WBC: 12 10*3/uL — ABNORMAL HIGH (ref 4.0–10.5)

## 2018-07-10 LAB — FERRITIN: Ferritin: 22.6 ng/mL (ref 22.0–322.0)

## 2018-07-10 NOTE — Progress Notes (Addendum)
[facility] Consult Note:  History: Clayton [name] 07/10/2018  Referring physician: Pearson Grippe, MD ([facility])  Reason for consult/chief complaint: Anemia; Constipation (taking iron ); and dark stools (due to iron per pt)   Subjective  HPI:  This is a very pleasant 86 year old man accompanied by his daughter and referred by nephrology for iron deficiency anemia.  He reports that years ago he was found to be anemic and was given some kind of injections.  He cannot recall what this was, but thinks perhaps his endocrinologist manage that.  He has been on iron for years says it causes constipation in terms of stool dark, so he really cannot say for sure if he has had any bleeding.  He denies black tarry stool or bright red blood per rectum.  His appetite is generally good and his weight variable.  He has remittent heartburn generally well controlled on medicine, denies dysphagia or vomiting.  STEMI with DES to 100% RCA stenosis April 20, 2018.  Cardiac catheterization report revealed severe multivessel disease.  He was felt not to be a good candidate for CABG.  He was feeling unwell few weeks back with fatigue, cough blood sugar and dizziness.  He had been treated primary care for a respiratory infection with Zithromax and prednisone and this drove up his glucose.  During ED visit for that on 1227, he was noted to have a hemoglobin of 7.7, lower than the 8.4 at nephrology visit shortly before that.  No changes in his treatment were made with that lab value. He has not been lightheaded since then, no chest pain since his MI in November. His daughter reports that his overall external status has slowly declined since the MI, he is now using a walker not able to get around as much.  He is still mentally sharp and enjoys spending time with his family and other activities.  ROS:  Review of Systems  Constitutional: Positive for fatigue. Negative for appetite change and  unexpected weight change.  HENT: Negative for mouth sores and voice change.   Eyes: Negative for pain and redness.  Respiratory: Positive for shortness of breath. Negative for cough.   Cardiovascular: Negative for chest pain and palpitations.  Genitourinary: Negative for dysuria and hematuria.  Musculoskeletal: Positive for arthralgias and back pain. Negative for myalgias.  Skin: Negative for pallor and rash.  Neurological: Negative for weakness and headaches.  Hematological: Negative for adenopathy.     Past Medical History: Past Medical History:  Diagnosis Date  . Anemia   . BPH (benign prostatic hyperplasia)   . CAD (coronary artery disease)    hx of stemi  04-2018   . Chronic renal failure   . Chronic renal insufficiency   . COPD (chronic obstructive pulmonary disease) ([place])   . DDD (degenerative disc disease), lumbar   . Diabetes mellitus   . Hyperlipidemia   . Hyperparathyroidism ([place])   . Hypertension   . IDA (iron deficiency anemia)   . Neuromuscular disorder ([place])   . OSA on CPAP   . Osteomyelitis of forearm ([place])   . Osteoporosis      Past Surgical History: Past Surgical History:  Procedure Laterality Date  . CARDIAC CATHETERIZATION    . CORONARY STENT INTERVENTION N/A 04/20/2018   Procedure: CORONARY STENT INTERVENTION;  Surgeon: Leonie Man, MD;  Location: [facility];  Service: Cardiovascular;  Laterality: N/A;  . CORONARY/GRAFT ACUTE MI REVASCULARIZATION N/A 04/20/2018   Procedure: Coronary/Graft Acute MI  Revascularization;  Surgeon: Leonie Man, MD;  Location: [facility];  Service: Cardiovascular;  Laterality: N/A;  . EYE SURGERY    . I&D EXTREMITY Right 08/13/2016   Procedure: IRRIGATION AND DEBRIDEMENT RIGHT ELBOW AND HAND;  Surgeon: Milly Jakob, MD;  Location: [facility];  Service: Orthopedics;  Laterality: Right;  . LEFT HEART CATH AND CORONARY ANGIOGRAPHY N/A 04/20/2018   Procedure: LEFT HEART CATH AND CORONARY ANGIOGRAPHY;   Surgeon: Leonie Man, MD;  Location: [facility];  Service: Cardiovascular;  Laterality: N/A;  . VASECTOMY    . VIDEO BRONCHOSCOPY Bilateral 08/25/2012   Procedure: VIDEO BRONCHOSCOPY WITHOUT FLUORO;  Surgeon: Brand Males, MD;  Location: [facility];  Service: Cardiopulmonary;  Laterality: Bilateral;     Family History: Family History  Problem Relation Age of Onset  . Diabetes Mellitus II Other   . Hypertension Other   . Other Father        complications from strep  . CVA Father   . Cancer Sister   . Other Brother        hip replacement complications     Social History: Social History   Socioeconomic History  . Marital status: Widowed    Spouse name: Not on file  . Number of children: 4  . Years of education: 16  . Highest education level: Bachelor's degree (e.g., BA, AB, BS)  Occupational History  . Occupation: retired  Scientific laboratory technician  . Financial resource strain: Not very hard  . Food insecurity:    Worry: Never true    Inability: Never true  . Transportation needs:    Medical: No    Non-medical: No  Tobacco Use  . Smoking status: Former Smoker    Packs/day: 3.00    Years: 33.00    Pack years: 99.00    Types: Cigarettes    Last attempt to quit: 06/19/1973    Years since quitting: 45.0  . Smokeless tobacco: Former Systems developer    Quit date: 09/26/1975  Substance and Sexual Activity  . Alcohol use: No  . Drug use: No  . Sexual activity: Not Currently  Lifestyle  . Physical activity:    Days per week: 0 days    Minutes per session: 0 min  . Stress: Not at all  Relationships  . Social connections:    Talks on phone: Not on file    Gets together: Not on file    Attends religious service: Not on file    Active member of club or organization: Not on file    Attends meetings of clubs or organizations: Not on file    Relationship status: Not on file  Other Topics Concern  . Not on file  Social History Narrative  . Not on file     Allergies: Allergies  Allergen Reactions  . Doxycycline Rash  . Levaquin [Levofloxacin In D5w]     Caused chest pain and heartburn  . Nitrous Oxide Other (See Comments)    Reaction:  Unknown   . Penicillins Other (See Comments)    Reaction:  Unknown Has patient had a PCN reaction causing immediate rash, facial/tongue/throat swelling, SOB or lightheadedness with hypotension: Unsure Has patient had a PCN reaction causing severe rash involving mucus membranes or skin necrosis: Unsure Has patient had a PCN reaction that required hospitalization Unsure  Has patient had a PCN reaction occurring within the last 10 years: No If all of the above answers are "NO", then may proceed with Cephalosporin use.  Marland Kitchen  Tape Other (See Comments)    Reaction:  Tears pts skin   . Diltiazem Rash    Outpatient Meds: Current Outpatient Medications  Medication Sig Dispense Refill  . ACCU-CHEK SOFTCLIX LANCETS lancets TEST AS INSTRUCTED TO OBTAIN A BLOOD SPECIMEN 4 TIMES DAILY 400 each 1  . albuterol (PROVENTIL HFA;VENTOLIN HFA) 108 (90 Base) MCG/ACT inhaler Inhale 1-2 puffs into the lungs every 6 (six) hours as needed for wheezing or shortness of breath.    . Alcohol Swabs (B-D SINGLE USE SWABS REGULAR) PADS USE 7 SWABS DAILY AS DIRECTED 600 each 0  . aspirin 81 MG chewable tablet Chew 1 tablet (81 mg total) by mouth daily. 90 tablet 3  . atorvastatin (LIPITOR) 80 MG tablet Take 1 tablet (80 mg total) by mouth daily. 30 tablet 11  . budesonide-formoterol (SYMBICORT) 160-4.5 MCG/ACT inhaler Inhale 2 puffs into the lungs 2 (two) times daily. 10.2 g 5  . cholecalciferol (VITAMIN D) 1000 units tablet Take 2,000 Units by mouth daily.    . famotidine (PEPCID) 20 MG tablet Take 1 tablet (20 mg total) by mouth at bedtime. 90 tablet 4  . ferrous sulfate 325 (65 FE) MG tablet Take 650 mg by mouth daily with breakfast.     . fesoterodine (TOVIAZ) 4 MG TB24 tablet Take 4 mg by mouth daily.    . finasteride (PROSCAR) 5  MG tablet TAKE 1 TABLET (5 MG TOTAL) BY MOUTH DAILY. (Patient taking differently: Take 5 mg by mouth daily. ) 90 tablet 1  . furosemide (LASIX) 20 MG tablet Take 1 tablet (20 mg total) by mouth daily. Take 2 tabs 5 times weekly. (Patient taking differently: Take 20 mg by mouth See admin instructions. Take 1 tabs 5 times weekly (Sunday, Monday, Tuesday, Thursday, and Saturday)) 30 tablet 3  . glucose blood test strip Use as instructed to test blood sugars 4 times daily 150 each 12  . insulin aspart (NOVOLOG) 100 UNIT/ML injection TAKE 8 UNITS UNDER THE SKIN DAILY Monday-Friday AND ON Saturday AND Sunday TAKE 6 UNITS AT BREAKFAST AND LUNCH, AND 8 UNITS AT DINNER. (Patient taking differently: TAKE 7 UNITS IN THE MORNING, 6 UNITS AT LUNCH, AND 8 UNITS AT SUPPER.) 50 mL 2  . insulin glargine (LANTUS) 100 UNIT/ML injection INJECT 24 UNITS SUBCUTANEOUSLY IN THE MORNING AND 20 UNITS IN THE EVENING (Patient taking differently: Inject 24 Units into the skin every morning. TAKE 24 UNITS EVERY MORNING.) 20 mL 0  . Insulin Syringe-Needle U-100 (BD INSULIN SYRINGE U/F) 31G X 5/16" 0.3 ML MISC USE THREE TIMES DAILY 270 each 1  . isosorbide mononitrate (IMDUR) 30 MG 24 hr tablet Take 1 tablet (30 mg total) by mouth daily. 30 tablet 11  . Melatonin 5 MG TABS Take 2 tablets by mouth at bedtime.     . Multiple Vitamin (MULTIVITAMIN WITH MINERALS) TABS tablet Take 1 tablet by mouth daily.    Marland Kitchen omeprazole (PRILOSEC) 20 MG capsule Take 20 mg by mouth daily.    . ticagrelor (BRILINTA) 90 MG TABS tablet Take 1 tablet (90 mg total) by mouth 2 (two) times daily. 180 tablet 3   No current facility-administered medications for this visit.       ___________________________________________________________________ Objective   Exam:  BP (!) 120/54   Pulse 82   Ht 5\' 10"  (1.778 m)   Wt 194 lb 2 oz (88.1 kg)   BMI 27.85 kg/m    General: this is a(n) chronically ill-appearing elderly man, gets on exam table  slowly and  with assistance.  He has normal mental status.  Eyes: sclera anicteric, no redness  ENT: oral mucosa moist without lesions, no cervical or supraclavicular lymphadenopathy  CV: RRR without murmur, S1/S2, no JVD, mild peripheral edema  Resp: clear to auscultation bilaterally, normal RR and effort noted  GI: soft, no tenderness, with active bowel sounds. No guarding or palpable organomegaly noted.  Ventral hernia  Skin; warm and dry, no rash or jaundice noted.  Multiple ecchymoses  Neuro: awake, alert and oriented x 3.  fluent speech no focal weakness  Labs:  05/29/2018 labs at [facility]:  BUN 53, creatinine 2.2, GFR 26, sodium 139, potassium 4.5 albumin 4.0 Hemoglobin 8.4 MCV 97, hematocrit 26, WBC 11.9 blood 176 H60 and folic acid normal Ferritin 28   CBC Latest Ref Rng & Units 06/13/2018 05/09/2018 04/24/2018  WBC 4.0 - 10.5 K/uL 18.5(H) 11.3(H) 10.1  Hemoglobin 13.0 - 17.0 g/dL 7.7(L) 10.5(L) 7.8(L)  Hematocrit 39.0 - 52.0 % 25.1(L) 30.8(L) 25.0(L)  Platelets 150 - 400 K/uL 371 435.0(H) 173   CMP Latest Ref Rng & Units 06/13/2018 05/09/2018 04/26/2018  Glucose 70 - 99 mg/dL 213(H) 128(H) 85  BUN 8 - 23 mg/dL 42(H) 59(H) 49(H)  Creatinine 0.61 - 1.24 mg/dL 2.44(H) 2.57(H) 2.05(H)  Sodium 135 - 145 mmol/L 137 138 140  Potassium 3.5 - 5.1 mmol/L 4.1 4.2 4.5  Chloride 98 - 111 mmol/L 104 104 114(H)  CO2 22 - 32 mmol/L 24 23 19(L)  Calcium 8.9 - 10.3 mg/dL 9.0 9.5 8.1(L)  Total Protein 6.0 - 8.3 g/dL - 7.5 -  Total Bilirubin 0.2 - 1.2 mg/dL - 0.4 -  Alkaline Phos 39 - 117 U/L - 84 -  AST 0 - 37 U/L - 24 -  ALT 0 - 53 U/L - 20 -   On admission 04/20/2018, hemoglobin 11.2 with MCV 102, 9% iron saturation, ferritin 21   Assessment: Encounter Diagnoses  Name Primary?  . Other iron deficiency anemia Yes  . Type 2 diabetes mellitus with hyperglycemia, with long-term current use of insulin ([place])   . Chronic kidney disease, stage IV (severe) ([place])   . Coronary artery  disease involving native coronary artery of native heart with other form of angina pectoris ([facility])     Chronic iron deficiency anemia that seems largely related to chronic kidney disease and is recently worse setting of acute illnesses.  He and his daughter reports some stool cards were done, but they are not sure who ordered them.  We will contact nephrology to see if they have the results and would send them to [place].  Even if stool is heme positive, that would not be terribly surprising given his condition and dual antiplatelet therapy.  I still feel he would be a high risk candidate for any endoscopic procedures at this juncture.  He cannot be off his antiplatelet therapy for anything other than emergency for a year after his coronatry intervention. I am hopeful that if he has adequate iron supplementation and (if appropriate) erythropoietin treatments, that we can get his hemoglobin into a different range that would make him more functional, especially considering his severe multivessel coronary disease and chronic kidney disease.  Plan:  CBC and ferritin today. If hemoglobin under 7.5, he needs hospitalization for transfusion.  His volume status would have to be managed closely even his CKD and overall condition. Otherwise, I am hopeful that IV iron will be sufficient.  We could make arrangements for first dose and  then during that management to his nephrologist.  Thank you for the courtesy of this consult.  Please call me with any questions or concerns.  Nelida Meuse III  CC: Referring provider noted above  07/14/18 results addendum:   From [facility] office:  Stool immunoassay card for occult blood negative on 06/17/18.  No endoscopy workup planned. Refer back to nephrology after upcoming IV iron treatment.  Madelon Lips, MD

## 2018-07-10 NOTE — Patient Instructions (Signed)
If you are age 83 or older, your body mass index should be between 23-30. Your Body mass index is 27.85 kg/m. If this is out of the aforementioned range listed, please consider follow up with your Primary Care Provider.  If you are age 51 or younger, your body mass index should be between 19-25. Your Body mass index is 27.85 kg/m. If this is out of the aformentioned range listed, please consider follow up with your Primary Care Provider.   Your provider has requested that you go to the basement level for lab work before leaving today. Press "B" on the elevator. The lab is located at the first door on the left as you exit the elevator.  It was a pleasure to see you today!  Dr. Loletha Carrow

## 2018-07-11 ENCOUNTER — Other Ambulatory Visit: Payer: Self-pay

## 2018-07-11 ENCOUNTER — Ambulatory Visit (HOSPITAL_COMMUNITY): Payer: Medicare PPO

## 2018-07-11 ENCOUNTER — Encounter (HOSPITAL_COMMUNITY)
Admission: RE | Admit: 2018-07-11 | Discharge: 2018-07-11 | Disposition: A | Discharge status: Home / Self Care | Payer: Medicare PPO | Source: Ambulatory Visit | Attending: Cardiology | Admitting: Cardiology

## 2018-07-11 DIAGNOSIS — Z955 Presence of coronary angioplasty implant and graft: Secondary | ICD-10-CM

## 2018-07-11 DIAGNOSIS — I2111 ST elevation (STEMI) myocardial infarction involving right coronary artery: Secondary | ICD-10-CM | POA: Diagnosis not present

## 2018-07-11 DIAGNOSIS — D509 Iron deficiency anemia, unspecified: Secondary | ICD-10-CM

## 2018-07-11 DIAGNOSIS — R188 Other ascites: Secondary | ICD-10-CM

## 2018-07-11 LAB — GLUCOSE, CAPILLARY
Glucose-Capillary: 185 mg/dL — ABNORMAL HIGH (ref 70–99)
Glucose-Capillary: 127 mg/dL — ABNORMAL HIGH (ref 70–99)

## 2018-07-11 MED ORDER — OMEPRAZOLE 20 MG PO CPDR: 20.0000 mg | DELAYED_RELEASE_CAPSULE | Freq: Every day | ORAL | 3 refills | Status: DC

## 2018-07-11 MED ORDER — ALBUMIN HUMAN 25 % IV SOLN: 25.0000 g | Freq: Once | INTRAVENOUS | Status: DC

## 2018-07-14 ENCOUNTER — Ambulatory Visit (HOSPITAL_COMMUNITY): Payer: Medicare PPO

## 2018-07-14 ENCOUNTER — Encounter (HOSPITAL_COMMUNITY)
Admission: RE | Admit: 2018-07-14 | Discharge: 2018-07-14 | Disposition: A | Discharge status: Home / Self Care | Payer: Medicare PPO | Source: Ambulatory Visit | Attending: Cardiology | Admitting: Cardiology

## 2018-07-14 DIAGNOSIS — I2111 ST elevation (STEMI) myocardial infarction involving right coronary artery: Secondary | ICD-10-CM | POA: Diagnosis not present

## 2018-07-14 DIAGNOSIS — Z955 Presence of coronary angioplasty implant and graft: Secondary | ICD-10-CM | POA: Diagnosis not present

## 2018-07-14 LAB — GLUCOSE, CAPILLARY: Glucose-Capillary: 141 mg/dL — ABNORMAL HIGH (ref 70–99)

## 2018-07-14 NOTE — Progress Notes (Signed)
Spoke with pts daughter and she is aware. 

## 2018-07-14 NOTE — Progress Notes (Signed)
Clayton [name] 85 y.o. male Nutrition Note Spoke with pt. Nutrition Plan and Nutrition Survey goals reviewed with pt. Pt is following a Heart Healthy diet. Heart healthy diabetic eating tips reviewed (label reading, how to build a healthy plate, portion sizes, eating frequently across the day). Pt mentioned his iron was low. Discussed high iron content foods with patient and distributed a handout to patient. Recommended pt add in a high iron content food to each meal to help increase consumption. Pt has Type 2 Diabetes. Last A1c indicates blood glucose well-controlled. This Probation officer went over Diabetes Education test results. Pt checks CBG's 3 -4 times a day. Fasting CBG's reportedly 110-150's mg/dL. Pt expressed understanding of the information reviewed. Pt aware of nutrition education classes offered and does plan on attending nutrition classes.  Lab Results  Component Value Date   HGBA1C 6.6 (H) 05/09/2018    Wt Readings from Last 3 Encounters:  07/10/18 194 lb 2 oz (88.1 kg)  07/03/18 191 lb 2.2 oz (86.7 kg)  06/13/18 197 lb (89.4 kg)    Nutrition Diagnosis ? Food-and nutrition-related knowledge deficit related to lack of exposure to information as related to diagnosis of: ? CVD ? T2DM  Nutrition Intervention ? Pt's individual nutrition plan reviewed with pt. ? Benefits of adopting Heart Healthy diet discussed when Medficts reviewed.   ? Pt given handouts for: ? High iron foods  Goal(s) ? Pt able to name foods that affect blood glucose. ? Pt to identify and include more high iron foods to meals  Plan:   Pt to attend nutrition classes ? Nutrition I ? Nutrition II ? Portion Distortion   Will provide client-centered nutrition education as part of interdisciplinary care  Monitor and evaluate progress toward nutrition goal with team.    Laurina Bustle, MS, RD, LDN 07/14/2018 9:15 AM

## 2018-07-15 ENCOUNTER — Other Ambulatory Visit: Payer: Self-pay | Admitting: Endocrinology

## 2018-07-16 ENCOUNTER — Ambulatory Visit (HOSPITAL_COMMUNITY)
Admission: RE | Admit: 2018-07-16 | Discharge: 2018-07-16 | Disposition: A | Discharge status: Home / Self Care | Payer: Medicare PPO | Source: Ambulatory Visit | Attending: Gastroenterology | Admitting: Gastroenterology

## 2018-07-16 ENCOUNTER — Encounter (HOSPITAL_COMMUNITY)
Admission: RE | Admit: 2018-07-16 | Discharge: 2018-07-16 | Disposition: A | Discharge status: Home / Self Care | Payer: Medicare PPO | Source: Ambulatory Visit | Attending: Cardiology | Admitting: Cardiology

## 2018-07-16 ENCOUNTER — Ambulatory Visit (HOSPITAL_COMMUNITY): Payer: Medicare PPO

## 2018-07-16 DIAGNOSIS — D509 Iron deficiency anemia, unspecified: Secondary | ICD-10-CM | POA: Diagnosis not present

## 2018-07-16 DIAGNOSIS — Z955 Presence of coronary angioplasty implant and graft: Secondary | ICD-10-CM | POA: Diagnosis not present

## 2018-07-16 DIAGNOSIS — I2111 ST elevation (STEMI) myocardial infarction involving right coronary artery: Secondary | ICD-10-CM

## 2018-07-16 LAB — GLUCOSE, CAPILLARY: Glucose-Capillary: 154 mg/dL — ABNORMAL HIGH (ref 70–99)

## 2018-07-16 MED ORDER — SODIUM CHLORIDE 0.9 % IV SOLN
510.0000 mg | Freq: Once | INTRAVENOUS | Status: AC
  Administered 2018-07-16: 510 mg via INTRAVENOUS
  Filled 2018-07-16: qty 17

## 2018-07-16 MED ORDER — SODIUM CHLORIDE 0.9 % IV SOLN
INTRAVENOUS | Status: DC | PRN
  Administered 2018-07-16: 250 mL via INTRAVENOUS

## 2018-07-16 NOTE — Discharge Instructions (Signed)

## 2018-07-16 NOTE — Progress Notes (Signed)
[facility]    Procedure: Feraheme  Note:  Patient received Feraheme infusion,tolerated the full amount. Post 30 minutes vital signs stable. Discharge instructions provided,verbalized understanding. Alert, oriented and ambulatory at discharge.

## 2018-07-17 ENCOUNTER — Telehealth: Payer: Self-pay | Admitting: Endocrinology

## 2018-07-17 NOTE — Telephone Encounter (Signed)
Patient had an iron infusion done yesterday and his blood sugars are running near 250. Patient would like to know what he should do. What amount of novolog he should use?

## 2018-07-17 NOTE — Telephone Encounter (Signed)
Pt aware of dosage change and asked to call back tomorrow afternoon with readings. No pt is not on prednisone.

## 2018-07-17 NOTE — Telephone Encounter (Signed)
He will take additional 5 units for blood sugars around 200 and additional 8 units if they are around 250.  Is he taking prednisone because iron does not make sugar go up?Marland Kitchen  Also if his morning sugars are staying over 200 he will need to increase his Lantus insulin by 6 units

## 2018-07-17 NOTE — Telephone Encounter (Signed)
Please advise 

## 2018-07-18 ENCOUNTER — Encounter (HOSPITAL_COMMUNITY)
Admission: RE | Admit: 2018-07-18 | Discharge: 2018-07-18 | Disposition: A | Discharge status: Home / Self Care | Payer: Medicare PPO | Source: Ambulatory Visit | Attending: Cardiology | Admitting: Cardiology

## 2018-07-18 ENCOUNTER — Ambulatory Visit (HOSPITAL_COMMUNITY): Payer: Medicare PPO

## 2018-07-18 DIAGNOSIS — I2111 ST elevation (STEMI) myocardial infarction involving right coronary artery: Secondary | ICD-10-CM | POA: Diagnosis not present

## 2018-07-18 DIAGNOSIS — Z955 Presence of coronary angioplasty implant and graft: Secondary | ICD-10-CM | POA: Diagnosis not present

## 2018-07-18 LAB — GLUCOSE, CAPILLARY: Glucose-Capillary: 216 mg/dL — ABNORMAL HIGH (ref 70–99)

## 2018-07-18 NOTE — Progress Notes (Signed)
Daily Session Note  Patient Details  Name: Clayton [name] MRN: [id number] Date of Birth: May 07, 1928 Referring Provider:   Flowsheet Row CARDIAC REHAB PHASE II ORIENTATION from 07/03/2018 in [facility]  Referring Provider  Dr. Ellyn Hack      Encounter Date: 07/18/2018  Check In: Session Check In - 07/18/18 0837    Check-In          Supervising physician immediately available to respond to emergencies  [facility] immediately available    Physician(s)  Dr. Waldron Labs    Location  [facility]    Staff Present  Hoy Register, MS, Exercise Physiologist;Tyara Carol Ada, MS,ACSM CEP, Exercise Physiologist;Shaquille Murdy, RN, Marga Melnick, RN, BSN    Medication changes reported      No    Fall or balance concerns reported     No    Tobacco Cessation  No Change    Warm-up and Cool-down  Performed as group-led instruction    Resistance Training Performed  Yes    VAD Patient?  No    PAD/SET Patient?  No        Pain Assessment          Currently in Pain?  No/denies    Pain Score  0-No pain    Multiple Pain Sites  No           Capillary Blood Glucose: Results for orders placed or performed during the hospital encounter of 07/18/18 (from the past 24 hour(s))  Glucose, capillary     Status: Abnormal   Collection Time: 07/18/18  9:11 AM  Result Value Ref Range   Glucose-Capillary 216 (H) 70 - 99 mg/dL      Social History   Tobacco Use  Smoking Status Former Smoker  . Packs/day: 3.00  . Years: 33.00  . Pack years: 99.00  . Types: Cigarettes  . Last attempt to quit: 06/19/1973  . Years since quitting: 45.1  Smokeless Tobacco Former Systems developer  . Quit date: 09/26/1975    Goals Met:  Exercise tolerated well  Goals Unmet:  Not Applicable  Comments: pt is interested in transitioning to new pieces of exercise equipment at next cardiac rehab session.  Unfortunately, pt has physical limitations to some equipment. Deitra Mayo,  Exercise Physiologist made aware and will adjust exercise prescription accordingly. Andi Hence, RN, BSN Cardiac Pulmonary Rehab    Dr. Fransico Him is Medical Director for Cardiac Rehab at [facility].

## 2018-07-21 ENCOUNTER — Encounter (HOSPITAL_COMMUNITY)
Admission: RE | Admit: 2018-07-21 | Discharge: 2018-07-21 | Disposition: A | Discharge status: Home / Self Care | Payer: Medicare PPO | Source: Ambulatory Visit | Attending: Cardiology | Admitting: Cardiology

## 2018-07-21 ENCOUNTER — Ambulatory Visit (HOSPITAL_COMMUNITY): Payer: Medicare PPO

## 2018-07-21 DIAGNOSIS — Z955 Presence of coronary angioplasty implant and graft: Secondary | ICD-10-CM | POA: Diagnosis not present

## 2018-07-21 DIAGNOSIS — I2111 ST elevation (STEMI) myocardial infarction involving right coronary artery: Secondary | ICD-10-CM | POA: Diagnosis not present

## 2018-07-23 ENCOUNTER — Encounter (HOSPITAL_COMMUNITY)
Admission: RE | Admit: 2018-07-23 | Discharge: 2018-07-23 | Disposition: A | Discharge status: Home / Self Care | Payer: Medicare PPO | Source: Ambulatory Visit | Attending: Cardiology | Admitting: Cardiology

## 2018-07-23 ENCOUNTER — Ambulatory Visit (HOSPITAL_COMMUNITY): Payer: Medicare PPO

## 2018-07-23 DIAGNOSIS — Z955 Presence of coronary angioplasty implant and graft: Secondary | ICD-10-CM

## 2018-07-23 DIAGNOSIS — I2111 ST elevation (STEMI) myocardial infarction involving right coronary artery: Secondary | ICD-10-CM

## 2018-07-24 NOTE — Progress Notes (Signed)
Cardiac Individual Treatment Plan  Patient Details  Name: Clayton [name] MRN: [id number] Date of Birth: Aug 29, 1927 Referring Provider:   Flowsheet Row CARDIAC REHAB PHASE II ORIENTATION from 07/03/2018 in [facility]  Referring Provider  Dr. Ellyn Hack      Initial Encounter Date:  Flowsheet Row CARDIAC REHAB PHASE II ORIENTATION from 07/03/2018 in [facility]  Date  07/03/18      Visit Diagnosis: ST elevation myocardial infarction involving right coronary artery ([place]), 04/20/18  Status post coronary artery stent placement, s/p DES RCA 04/20/18  Patient's Home Medications on Admission:  Current Outpatient Medications:  .  ACCU-CHEK SOFTCLIX LANCETS lancets, TEST AS INSTRUCTED TO OBTAIN A BLOOD SPECIMEN 4 TIMES DAILY, Disp: 400 each, Rfl: 1 .  albuterol (PROVENTIL HFA;VENTOLIN HFA) 108 (90 Base) MCG/ACT inhaler, Inhale 1-2 puffs into the lungs every 6 (six) hours as needed for wheezing or shortness of breath., Disp: , Rfl:  .  Alcohol Swabs (B-D SINGLE USE SWABS REGULAR) PADS, USE 7 SWABS DAILY AS DIRECTED, Disp: 600 each, Rfl: 0 .  aspirin 81 MG chewable tablet, Chew 1 tablet (81 mg total) by mouth daily., Disp: 90 tablet, Rfl: 3 .  atorvastatin (LIPITOR) 80 MG tablet, Take 1 tablet (80 mg total) by mouth daily., Disp: 30 tablet, Rfl: 11 .  budesonide-formoterol (SYMBICORT) 160-4.5 MCG/ACT inhaler, Inhale 2 puffs into the lungs 2 (two) times daily., Disp: 10.2 g, Rfl: 5 .  cholecalciferol (VITAMIN D) 1000 units tablet, Take 2,000 Units by mouth daily., Disp: , Rfl:  .  DROPLET INSULIN SYRINGE 31G X 5/16" 0.3 ML MISC, USE THREE TIMES DAILY, Disp: 300 each, Rfl: 2 .  famotidine (PEPCID) 20 MG tablet, Take 1 tablet (20 mg total) by mouth at bedtime., Disp: 90 tablet, Rfl: 4 .  ferrous sulfate 325 (65 FE) MG tablet, Take 650 mg by mouth daily with breakfast. , Disp: , Rfl:  .  fesoterodine (TOVIAZ) 4 MG TB24 tablet, Take 4 mg by  mouth daily., Disp: , Rfl:  .  finasteride (PROSCAR) 5 MG tablet, TAKE 1 TABLET (5 MG TOTAL) BY MOUTH DAILY. (Patient taking differently: Take 5 mg by mouth daily. ), Disp: 90 tablet, Rfl: 1 .  furosemide (LASIX) 20 MG tablet, Take 1 tablet (20 mg total) by mouth daily. Take 2 tabs 5 times weekly. (Patient taking differently: Take 20 mg by mouth See admin instructions. Take 1 tabs 5 times weekly (Sunday, Monday, Tuesday, Thursday, and Saturday)), Disp: 30 tablet, Rfl: 3 .  glucose blood test strip, Use as instructed to test blood sugars 4 times daily, Disp: 150 each, Rfl: 12 .  insulin aspart (NOVOLOG) 100 UNIT/ML injection, TAKE 8 UNITS UNDER THE SKIN DAILY Monday-Friday AND ON Saturday AND Sunday TAKE 6 UNITS AT BREAKFAST AND LUNCH, AND 8 UNITS AT DINNER. (Patient taking differently: TAKE 7 UNITS IN THE MORNING, 6 UNITS AT LUNCH, AND 8 UNITS AT SUPPER.), Disp: 50 mL, Rfl: 2 .  insulin glargine (LANTUS) 100 UNIT/ML injection, INJECT 24 UNITS SUBCUTANEOUSLY IN THE MORNING AND 20 UNITS IN THE EVENING (Patient taking differently: Inject 24 Units into the skin every morning. TAKE 24 UNITS EVERY MORNING.), Disp: 20 mL, Rfl: 0 .  isosorbide mononitrate (IMDUR) 30 MG 24 hr tablet, Take 1 tablet (30 mg total) by mouth daily., Disp: 30 tablet, Rfl: 11 .  Melatonin 5 MG TABS, Take 2 tablets by mouth at bedtime. , Disp: , Rfl:  .  Multiple Vitamin (MULTIVITAMIN  WITH MINERALS) TABS tablet, Take 1 tablet by mouth daily., Disp: , Rfl:  .  omeprazole (PRILOSEC) 20 MG capsule, Take 1 capsule (20 mg total) by mouth daily., Disp: 30 capsule, Rfl: 3 .  ticagrelor (BRILINTA) 90 MG TABS tablet, Take 1 tablet (90 mg total) by mouth 2 (two) times daily., Disp: 180 tablet, Rfl: 3  Past Medical History: Past Medical History:  Diagnosis Date  . Anemia   . BPH (benign prostatic hyperplasia)   . CAD (coronary artery disease)    hx of stemi  04-2018   . Chronic renal failure   . Chronic renal insufficiency   . COPD  (chronic obstructive pulmonary disease) ([place])   . DDD (degenerative disc disease), lumbar   . Diabetes mellitus   . Hyperlipidemia   . Hyperparathyroidism ([place])   . Hypertension   . IDA (iron deficiency anemia)   . Neuromuscular disorder ([place])   . OSA on CPAP   . Osteomyelitis of forearm ([place])   . Osteoporosis     Tobacco Use: Social History   Tobacco Use  Smoking Status Former Smoker  . Packs/day: 3.00  . Years: 33.00  . Pack years: 99.00  . Types: Cigarettes  . Last attempt to quit: 06/19/1973  . Years since quitting: 45.1  Smokeless Tobacco Former Systems developer  . Quit date: 09/26/1975    Labs: Recent Review Flowsheet Data    Labs for ITP Cardiac and Pulmonary Rehab Latest Ref Rng & Units 10/03/2017 02/07/2018 04/20/2018 04/21/2018 05/09/2018   Cholestrol 0 - 200 mg/dL - - 121 - 108   LDLCALC 0 - 99 mg/dL - - 62 - 56   HDL >39.00 mg/dL - - 37(L) - 35.30(L)   Trlycerides 0.0 - 149.0 mg/dL - - 111 - 83.0   Hemoglobin A1c 4.6 - 6.5 % 6.4 6.8(H) - 6.3(H) 6.6(H)   PHART 7.350 - 7.450 - - - - -   PCO2ART 35.0 - 45.0 mmHg - - - - -   HCO3 20.0 - 24.0 mEq/L - - - - -   TCO2 22 - 32 mmol/L - - 20(L) - -   ACIDBASEDEF 0.0 - 2.0 mmol/L - - - - -   O2SAT % - - - - -      Capillary Blood Glucose: Lab Results  Component Value Date   GLUCAP 216 (H) 07/18/2018   GLUCAP 154 (H) 07/16/2018   GLUCAP 141 (H) 07/14/2018   GLUCAP 127 (H) 07/11/2018   GLUCAP 185 (H) 07/11/2018     Exercise Target Goals: Exercise Program Goal: Individual exercise prescription set using results from initial 6 min walk test and THRR while considering  patient's activity barriers and safety.   Exercise Prescription Goal: Initial exercise prescription builds to 30-45 minutes a day of aerobic activity, 2-3 days per week.  Home exercise guidelines will be given to patient during program as part of exercise prescription that the participant will acknowledge.  Activity Barriers & Risk Stratification: Activity  Barriers & Cardiac Risk Stratification - 07/03/18 1023    Activity Barriers & Cardiac Risk Stratification          Activity Barriers  Muscular Weakness;Deconditioning;Balance Concerns;History of Falls;Assistive Device    Cardiac Risk Stratification  High           6 Minute Walk: 6 Minute Walk    6 Minute Walk    Row Name 07/03/18 1021   Phase  Initial   Distance  600 feet   Walk Time  6  minutes   # of Rest Breaks  1   MPH  1.1   METS  0.8   RPE  13   VO2 Peak  2.84   Symptoms  Yes (comment)   Comments  Fatigue   Resting HR  98 bpm   Resting BP  120/70   Resting Oxygen Saturation   97 %   Exercise Oxygen Saturation  during 6 min walk  97 %   Max Ex. HR  112 bpm   Max Ex. BP  124/74   2 Minute Post BP  108/56          Oxygen Initial Assessment:   Oxygen Re-Evaluation:   Oxygen Discharge (Final Oxygen Re-Evaluation):   Initial Exercise Prescription: Initial Exercise Prescription - 07/03/18 1000    Date of Initial Exercise RX and Referring Provider          Date  07/03/18    Referring Provider  Dr. Ellyn Hack    Expected Discharge Date  10/10/18        NuStep          Level  2    SPM  75    Minutes  20    METs  0.8        Arm Ergometer          Level  1    Watts  8    Minutes  10    METs  1.6        Prescription Details          Frequency (times per week)  3    Duration  Progress to 30 minutes of continuous aerobic without signs/symptoms of physical distress        Intensity          THRR 40-80% of Max Heartrate  53-106    Ratings of Perceived Exertion  11-13        Progression          Progression  Continue to progress workloads to maintain intensity without signs/symptoms of physical distress.        Resistance Training          Training Prescription  Yes    Weight  2 lbs    Reps  10-15           Perform Capillary Blood Glucose checks as needed.  Exercise Prescription Changes: Exercise Prescription Changes    Response to  Exercise    Row Name 07/07/18 0900 07/21/18 1000   Blood Pressure (Admit)  102/60  120/60   Blood Pressure (Exercise)  118/64  120/64   Blood Pressure (Exit)  102/68  100/52   Heart Rate (Admit)  99 bpm  88 bpm   Heart Rate (Exercise)  114 bpm  114 bpm   Heart Rate (Exit)  98 bpm  98 bpm   Rating of Perceived Exertion (Exercise)  13  12   Comments  Pt first day of exercise.  no documentation   Duration  Progress to 30 minutes of  aerobic without signs/symptoms of physical distress  Progress to 30 minutes of  aerobic without signs/symptoms of physical distress   Intensity  THRR unchanged  THRR unchanged       Progression    Row Name 07/07/18 0900 07/21/18 1000   Progression  Continue to progress workloads to maintain intensity without signs/symptoms of physical distress.  Continue to progress workloads to maintain intensity without signs/symptoms of physical distress.   Average METs  1.4  1.8       Resistance Training    Row Name 07/07/18 0900 07/21/18 1000   Training Prescription  no documentation  Yes   Weight  2 lbs  2 lbs   Reps  10-15  10-15   Time  10 Minutes  [place] Name 07/07/18 0900 07/21/18 1000   Level  2  2   SPM  75  75   Minutes  20  20   METs  1.3  1.8       Arm Ergometer    Row Name 07/07/18 0900 07/21/18 1000   Level  1  1   Watts  29  29   Minutes  10  10   METs  1.48  1.5       Track    Row Name 07/07/18 0900 07/21/18 1000   Laps  no documentation  6   Minutes  no documentation  10   METs  no documentation  2          Exercise Comments: Exercise Comments    Row Name 07/07/18 0954 07/23/18 1009   Exercise Comments  Pt first day of exercise. Tolerated exercise well.   Reviewed METs and goals with Pt. Pt is tolerating exercise well.       Exercise Goals and Review: Exercise Goals    Exercise Goals    Row Name 07/03/18 1023   Increase Physical Activity  Yes   Intervention  Provide advice, education, support and  counseling about physical activity/exercise needs.;Develop an individualized exercise prescription for aerobic and resistive training based on initial evaluation findings, risk stratification, comorbidities and participant's personal goals.   Expected Outcomes  Short Term: Attend rehab on a regular basis to increase amount of physical activity.   Increase Strength and Stamina  Yes   Intervention  Provide advice, education, support and counseling about physical activity/exercise needs.;Develop an individualized exercise prescription for aerobic and resistive training based on initial evaluation findings, risk stratification, comorbidities and participant's personal goals.   Expected Outcomes  Short Term: Increase workloads from initial exercise prescription for resistance, speed, and METs.   Able to understand and use rate of perceived exertion (RPE) scale  Yes   Intervention  Provide education and explanation on how to use RPE scale   Expected Outcomes  Short Term: Able to use RPE daily in rehab to express subjective intensity level;Long Term:  Able to use RPE to guide intensity level when exercising independently   Knowledge and understanding of Target Heart Rate Range (THRR)  Yes   Intervention  Provide education and explanation of THRR including how the numbers were predicted and where they are located for reference   Expected Outcomes  Short Term: Able to state/look up THRR;Long Term: Able to use THRR to govern intensity when exercising independently;Short Term: Able to use daily as guideline for intensity in rehab   Able to check pulse independently  Yes   Intervention  Provide education and demonstration on how to check pulse in carotid and radial arteries.;Review the importance of being able to check your own pulse for safety during independent exercise   Expected Outcomes  Short Term: Able to explain why pulse checking is important during independent exercise;Long Term: Able to check pulse  independently and accurately   Understanding of Exercise Prescription  Yes   Intervention  Provide education, explanation, and written materials on patient's individual exercise  prescription   Expected Outcomes  Short Term: Able to explain program exercise prescription;Long Term: Able to explain home exercise prescription to exercise independently          Exercise Goals Re-Evaluation : Exercise Goals Re-Evaluation    Exercise Goal Re-Evaluation    Row Name 07/07/18 0953 07/23/18 1006   Exercise Goals Review  Increase Physical Activity;Increase Strength and Stamina;Able to understand and use rate of perceived exertion (RPE) scale;Knowledge and understanding of Target Heart Rate Range (THRR);Understanding of Exercise Prescription  Increase Physical Activity;Increase Strength and Stamina;Able to understand and use rate of perceived exertion (RPE) scale;Knowledge and understanding of Target Heart Rate Range (THRR);Understanding of Exercise Prescription   Comments  Pt first day of exercise. Pt tolerated exercise Rx well.   Reviewed METs and goals with Pt. Pt has a MET level of 1.8. Tolerating exercise Rx well. Encouraged Pt to increase SPM on the stepper to 85. Pt is walking some at home in addition to cardiac rehab.    Expected Outcomes  Will continue to monitor and progress Pt as tolerated.   Will continue to monitor and progress Pt as tolerated.           Discharge Exercise Prescription (Final Exercise Prescription Changes): Exercise Prescription Changes - 07/21/18 1000    Response to Exercise          Blood Pressure (Admit)  120/60    Blood Pressure (Exercise)  120/64    Blood Pressure (Exit)  100/52    Heart Rate (Admit)  88 bpm    Heart Rate (Exercise)  114 bpm    Heart Rate (Exit)  98 bpm    Rating of Perceived Exertion (Exercise)  12    Duration  Progress to 30 minutes of  aerobic without signs/symptoms of physical distress    Intensity  THRR unchanged        Progression           Progression  Continue to progress workloads to maintain intensity without signs/symptoms of physical distress.    Average METs  1.8        Resistance Training          Training Prescription  Yes    Weight  2 lbs    Reps  10-15    Time  10 Minutes        NuStep          Level  2    SPM  75    Minutes  20    METs  1.8        Arm Ergometer          Level  1    Watts  29    Minutes  10    METs  1.5        Track          Laps  6    Minutes  10    METs  2           Nutrition:  Target Goals: Understanding of nutrition guidelines, daily intake of sodium <1515m, cholesterol <2022m calories 30% from fat and 7% or less from saturated fats, daily to have 5 or more servings of fruits and vegetables.  Biometrics: Pre Biometrics - 07/03/18 1022    Pre Biometrics          Height  5' 10"  (1.778 m)    Weight  86.7 kg    Waist Circumference  45 inches    Hip Circumference  43 inches    Waist to Hip Ratio  1.05 %    BMI (Calculated)  27.43    Triceps Skinfold  23 mm    % Body Fat  32.1 %    Grip Strength  16 kg    Flexibility  0 in    Single Leg Stand  0 seconds            Nutrition Therapy Plan and Nutrition Goals: Nutrition Therapy & Goals - 07/14/18 0915    Nutrition Therapy          Diet  heart healthy, carb modified        Personal Nutrition Goals          Nutrition Goal  pt able to name foods that affect blood glucose    Personal Goal #2  Pt to identify and include more high iron foods to meals        Intervention Plan          Intervention  Prescribe, educate and counsel regarding individualized specific dietary modifications aiming towards targeted core components such as weight, hypertension, lipid management, diabetes, heart failure and other comorbidities.    Expected Outcomes  Short Term Goal: Understand basic principles of dietary content, such as calories, fat, sodium, cholesterol and nutrients.;Long Term Goal: Adherence to prescribed  nutrition plan.           Nutrition Assessments: Nutrition Assessments - 07/03/18 1127    MEDFICTS Scores          Pre Score  18           Nutrition Goals Re-Evaluation: Nutrition Goals Re-Evaluation    Goals    Row Name 07/03/18 1125   Current Weight  191 lb 2.2 oz (86.7 kg)          Nutrition Goals Re-Evaluation: Nutrition Goals Re-Evaluation    Goals    Row Name 07/03/18 1125   Current Weight  191 lb 2.2 oz (86.7 kg)          Nutrition Goals Discharge (Final Nutrition Goals Re-Evaluation): Nutrition Goals Re-Evaluation - 07/03/18 1125    Goals          Current Weight  191 lb 2.2 oz (86.7 kg)           Psychosocial: Target Goals: Acknowledge presence or absence of significant depression and/or stress, maximize coping skills, provide positive support system. Participant is able to verbalize types and ability to use techniques and skills needed for reducing stress and depression.  Initial Review & Psychosocial Screening: Initial Psych Review & Screening - 07/03/18 1123    Initial Review          Current issues with  None Identified        Family Dynamics          Good Support System?  Yes   Rush Landmark has his [name] for support       Barriers          Psychosocial barriers to participate in program  There are no identifiable barriers or psychosocial needs.        Screening Interventions          Interventions  Encouraged to exercise           Quality of Life Scores: Quality of Life - 07/03/18 1029    Quality of Life          Select  Quality of Life        Quality  of Life Scores          Health/Function Pre  28.83 %    Socioeconomic Pre  30 %    Psych/Spiritual Pre  30 %    Family Pre  30 %    GLOBAL Pre  29.44 %          Scores of 19 and below usually indicate a poorer quality of life in these areas.  A difference of  2-3 points is a clinically meaningful difference.  A difference of 2-3 points in the total score of the  Quality of Life Index has been associated with significant improvement in overall quality of life, self-image, physical symptoms, and general health in studies assessing change in quality of life.  PHQ-9: Recent Review Flowsheet Data    Depression screen [facility] 2/9 07/07/2018   Decreased Interest 0   Down, Depressed, Hopeless 0   PHQ - 2 Score 0     Interpretation of Total Score  Total Score Depression Severity:  1-4 = Minimal depression, 5-9 = Mild depression, 10-14 = Moderate depression, 15-19 = Moderately severe depression, 20-27 = Severe depression   Psychosocial Evaluation and Intervention: Psychosocial Evaluation - 07/07/18 1013    Psychosocial Evaluation & Interventions          Interventions  Encouraged to exercise with the program and follow exercise prescription    Comments  no psychosocial needs identified, no interventions necessary. pt lives with his [name].  She is his caregiver. pt has 4 children, 9 grands and 6 great grands. pt enjoys watching [employer] football.     Expected Outcomes  pt will demonstrate positive outlook with good coping skills.     Continue Psychosocial Services   No Follow up required           Psychosocial Re-Evaluation: Psychosocial Re-Evaluation    Psychosocial Re-Evaluation    [place] Name 07/23/18 1115   Current issues with  None Identified   Comments  no psychosocial needs identified, no interventions necessary    Expected Outcomes  pt will exhibit positive outlook with good coping skills.    Interventions  Encouraged to attend Cardiac Rehabilitation for the exercise   Continue Psychosocial Services   No Follow up required          Psychosocial Discharge (Final Psychosocial Re-Evaluation): Psychosocial Re-Evaluation - 07/23/18 1115    Psychosocial Re-Evaluation          Current issues with  None Identified    Comments  no psychosocial needs identified, no interventions necessary     Expected Outcomes  pt will exhibit  positive outlook with good coping skills.     Interventions  Encouraged to attend Cardiac Rehabilitation for the exercise    Continue Psychosocial Services   No Follow up required           Vocational Rehabilitation: Provide vocational rehab assistance to qualifying candidates.   Vocational Rehab Evaluation & Intervention: Vocational Rehab - 07/03/18 1128    Initial Vocational Rehab Evaluation & Intervention          Assessment shows need for Vocational Rehabilitation  No   Mr Arko is retired and does not need vocational rehab at this time          Education: Education Goals: Education classes will be provided on a weekly basis, covering required topics. Participant will state understanding/return demonstration of topics presented.  Learning Barriers/Preferences: Learning Barriers/Preferences - 07/03/18 1024    Learning Barriers/Preferences  Learning Barriers  Sight;Hearing    Learning Preferences  Audio;Verbal Instruction           Education Topics: Count Your Pulse:  -Group instruction provided by verbal instruction, demonstration, patient participation and written materials to support subject.  Instructors address importance of being able to find your pulse and how to count your pulse when at home without a heart monitor.  Patients get hands on experience counting their pulse with staff help and individually. Flowsheet Row CARDIAC REHAB PHASE II EXERCISE from 07/23/2018 in [place]  Date  07/11/18  Instruction Review Code  2- Demonstrated Understanding      Heart Attack, Angina, and Risk Factor Modification:  -Group instruction provided by verbal instruction, video, and written materials to support subject.  Instructors address signs and symptoms of angina and heart attacks.    Also discuss risk factors for heart disease and how to make changes to improve heart health risk factors.   Functional Fitness:  -Group instruction  provided by verbal instruction, demonstration, patient participation, and written materials to support subject.  Instructors address safety measures for doing things around the house.  Discuss how to get up and down off the floor, how to pick things up properly, how to safely get out of a chair without assistance, and balance training.   Meditation and Mindfulness:  -Group instruction provided by verbal instruction, patient participation, and written materials to support subject.  Instructor addresses importance of mindfulness and meditation practice to help reduce stress and improve awareness.  Instructor also leads participants through a meditation exercise.    Stretching for Flexibility and Mobility:  -Group instruction provided by verbal instruction, patient participation, and written materials to support subject.  Instructors lead participants through series of stretches that are designed to increase flexibility thus improving mobility.  These stretches are additional exercise for major muscle groups that are typically performed during regular warm up and cool down.   Hands Only CPR:  -Group verbal, video, and participation provides a basic overview of AHA guidelines for community CPR. Role-play of emergencies allow participants the opportunity to practice calling for help and chest compression technique with discussion of AED use.   Hypertension: -Group verbal and written instruction that provides a basic overview of hypertension including the most recent diagnostic guidelines, risk factor reduction with self-care instructions and medication management.    Nutrition I class: Heart Healthy Eating:  -Group instruction provided by PowerPoint slides, verbal discussion, and written materials to support subject matter. The instructor gives an explanation and review of the Therapeutic Lifestyle Changes diet recommendations, which includes a discussion on lipid goals, dietary fat, sodium, fiber,  plant stanol/sterol esters, sugar, and the components of a well-balanced, healthy diet.   Nutrition II class: Lifestyle Skills:  -Group instruction provided by PowerPoint slides, verbal discussion, and written materials to support subject matter. The instructor gives an explanation and review of label reading, grocery shopping for heart health, heart healthy recipe modifications, and ways to make healthier choices when eating out.   Diabetes Question & Answer:  -Group instruction provided by PowerPoint slides, verbal discussion, and written materials to support subject matter. The instructor gives an explanation and review of diabetes co-morbidities, pre- and post-prandial blood glucose goals, pre-exercise blood glucose goals, signs, symptoms, and treatment of hypoglycemia and hyperglycemia, and foot care basics.   Diabetes Blitz:  -Group instruction provided by PowerPoint slides, verbal discussion, and written materials to support subject matter. The instructor gives an explanation  and review of the physiology behind type 1 and type 2 diabetes, diabetes medications and rational behind using different medications, pre- and post-prandial blood glucose recommendations and Hemoglobin A1c goals, diabetes diet, and exercise including blood glucose guidelines for exercising safely.    Portion Distortion:  -Group instruction provided by PowerPoint slides, verbal discussion, written materials, and food models to support subject matter. The instructor gives an explanation of serving size versus portion size, changes in portions sizes over the last 20 years, and what consists of a serving from each food group.   Stress Management:  -Group instruction provided by verbal instruction, video, and written materials to support subject matter.  Instructors review role of stress in heart disease and how to cope with stress positively.   Flowsheet Row CARDIAC REHAB PHASE II EXERCISE from 07/23/2018 in [place]  Date  07/18/18  Instruction Review Code  2- Demonstrated Understanding      Exercising on Your Own:  -Group instruction provided by verbal instruction, power point, and written materials to support subject.  Instructors discuss benefits of exercise, components of exercise, frequency and intensity of exercise, and end points for exercise.  Also discuss use of nitroglycerin and activating EMS.  Review options of places to exercise outside of rehab.  Review guidelines for sex with heart disease.   Cardiac Drugs I:  -Group instruction provided by verbal instruction and written materials to support subject.  Instructor reviews cardiac drug classes: antiplatelets, anticoagulants, beta blockers, and statins.  Instructor discusses reasons, side effects, and lifestyle considerations for each drug class.   Cardiac Drugs II:  -Group instruction provided by verbal instruction and written materials to support subject.  Instructor reviews cardiac drug classes: angiotensin converting enzyme inhibitors (ACE-I), angiotensin II receptor blockers (ARBs), nitrates, and calcium channel blockers.  Instructor discusses reasons, side effects, and lifestyle considerations for each drug class.   Anatomy and Physiology of the Circulatory System:  Group verbal and written instruction and models provide basic cardiac anatomy and physiology, with the coronary electrical and arterial systems. Review of: AMI, Angina, Valve disease, Heart Failure, Peripheral Artery Disease, Cardiac Arrhythmia, Pacemakers, and the ICD. Flowsheet Row CARDIAC REHAB PHASE II EXERCISE from 07/23/2018 in [place]  Date  07/23/18  Educator  RN  Instruction Review Code  2- Demonstrated Understanding      Other Education:  -Group or individual verbal, written, or video instructions that support the educational goals of the cardiac rehab program.   Holiday Eating Survival Tips:   -Group instruction provided by PowerPoint slides, verbal discussion, and written materials to support subject matter. The instructor gives patients tips, tricks, and techniques to help them not only survive but enjoy the holidays despite the onslaught of food that accompanies the holidays.   Knowledge Questionnaire Score: Knowledge Questionnaire Score - 07/03/18 1025    Knowledge Questionnaire Score          Pre Score  16/24           Core Components/Risk Factors/Patient Goals at Admission: Personal Goals and Risk Factors at Admission - 07/03/18 1025    Core Components/Risk Factors/Patient Goals on Admission           Weight Management  Yes;Obesity;Weight Maintenance;Weight Loss    Intervention  Weight Management: Develop a combined nutrition and exercise program designed to reach desired caloric intake, while maintaining appropriate intake of nutrient and fiber, sodium and fats, and appropriate energy expenditure required for the weight  goal.;Weight Management: Provide education and appropriate resources to help participant work on and attain dietary goals.;Weight Management/Obesity: Establish reasonable short term and long term weight goals.;Obesity: Provide education and appropriate resources to help participant work on and attain dietary goals.    Admit Weight  191 lb 2.2 oz (86.7 kg)    Expected Outcomes  Short Term: Continue to assess and modify interventions until short term weight is achieved;Long Term: Adherence to nutrition and physical activity/exercise program aimed toward attainment of established weight goal;Weight Maintenance: Understanding of the daily nutrition guidelines, which includes 25-35% calories from fat, 7% or less cal from saturated fats, less than 286m cholesterol, less than 1.5gm of sodium, & 5 or more servings of fruits and vegetables daily;Weight Loss: Understanding of general recommendations for a balanced deficit meal plan, which promotes 1-2 lb weight loss per  week and includes a negative energy balance of [phone] kcal/d;Understanding recommendations for meals to include 15-35% energy as protein, 25-35% energy from fat, 35-60% energy from carbohydrates, less than 2037mof dietary cholesterol, 20-35 gm of total fiber daily;Understanding of distribution of calorie intake throughout the day with the consumption of 4-5 meals/snacks    Diabetes  Yes    Intervention  Provide education about signs/symptoms and action to take for hypo/hyperglycemia.;Provide education about proper nutrition, including hydration, and aerobic/resistive exercise prescription along with prescribed medications to achieve blood glucose in normal ranges: Fasting glucose 65-99 mg/dL    Expected Outcomes  Short Term: Participant verbalizes understanding of the signs/symptoms and immediate care of hyper/hypoglycemia, proper foot care and importance of medication, aerobic/resistive exercise and nutrition plan for blood glucose control.;Long Term: Attainment of HbA1C < 7%.    Hypertension  Yes    Intervention  Provide education on lifestyle modifcations including regular physical activity/exercise, weight management, moderate sodium restriction and increased consumption of fresh fruit, vegetables, and low fat dairy, alcohol moderation, and smoking cessation.;Monitor prescription use compliance.    Expected Outcomes  Short Term: Continued assessment and intervention until BP is < 140/9052mG in hypertensive participants. < 130/67m58m in hypertensive participants with diabetes, heart failure or chronic kidney disease.;Long Term: Maintenance of blood pressure at goal levels.    Lipids  Yes    Intervention  Provide education and support for participant on nutrition & aerobic/resistive exercise along with prescribed medications to achieve LDL <70mg25mL >40mg.66mExpected Outcomes  Short Term: Participant states understanding of desired cholesterol values and is compliant with medications prescribed.  Participant is following exercise prescription and nutrition guidelines.;Long Term: Cholesterol controlled with medications as prescribed, with individualized exercise RX and with personalized nutrition plan. Value goals: LDL < 70mg, 15m> 40 mg.    Stress  Yes    Intervention  Offer individual and/or small group education and counseling on adjustment to heart disease, stress management and health-related lifestyle change. Teach and support self-help strategies.;Refer participants experiencing significant psychosocial distress to appropriate mental health specialists for further evaluation and treatment. When possible, include family members and significant others in education/counseling sessions.    Expected Outcomes  Short Term: Participant demonstrates changes in health-related behavior, relaxation and other stress management skills, ability to obtain effective social support, and compliance with psychotropic medications if prescribed.;Long Term: Emotional wellbeing is indicated by absence of clinically significant psychosocial distress or social isolation.           Core Components/Risk Factors/Patient Goals Review:  Goals and Risk Factor Review    Core Components/Risk Factors/Patient Goals Review  [place] Name 07/07/18 1015 07/23/18 1116   Personal Goals Review  Weight Management/Obesity;Diabetes;Hypertension;Lipids;Stress  Weight Management/Obesity;Diabetes;Hypertension;Lipids;Stress   Review  pt with multiple CAD RF demonstrates willingness to participate in CR program. pt personal goals are to increase strength/stamina, funcational ability and increased ease with daily activities.    pt with multiple CAD RF demonstrates willingness to participate in CR program. pt personal goals are to increase strength/stamina, funcational ability and increased ease with daily activities. pt is enjoying playing cards at [facility].    Expected Outcomes  pt will participate in CR exercise, nutrition and   lifestyle modification to decrease overall RF.   pt will participate in CR exercise, nutrition and  lifestyle modification to decrease overall RF.           Core Components/Risk Factors/Patient Goals at Discharge (Final Review):  Goals and Risk Factor Review - 07/23/18 1116    Core Components/Risk Factors/Patient Goals Review          Personal Goals Review  Weight Management/Obesity;Diabetes;Hypertension;Lipids;Stress    Review  pt with multiple CAD RF demonstrates willingness to participate in CR program. pt personal goals are to increase strength/stamina, funcational ability and increased ease with daily activities. pt is enjoying playing cards at [facility].     Expected Outcomes  pt will participate in CR exercise, nutrition and  lifestyle modification to decrease overall RF.            ITP Comments: ITP Comments    Row Name 07/03/18 1019 07/07/18 1008 07/23/18 1111   ITP Comments  Dr. Fransico Him, Medical Director  pt started group exercise. pt tolerated light activity without difficulty. pt oriented to exercise equipment and safety routine. pt home rollator not adequate size for pt height, unable to adjust for taller setting. pt daughter, [name], has made request to pt PCP for rollator order.  She plans to purchase at local equipment supplier. understanding verbalized.    30 day ITP review.  pt with good attendance and participation.        Comments:

## 2018-07-25 ENCOUNTER — Ambulatory Visit (HOSPITAL_COMMUNITY): Payer: Medicare PPO

## 2018-07-25 ENCOUNTER — Encounter (HOSPITAL_COMMUNITY)
Admission: RE | Admit: 2018-07-25 | Discharge: 2018-07-25 | Disposition: A | Discharge status: Home / Self Care | Payer: Medicare PPO | Source: Ambulatory Visit | Attending: Cardiology | Admitting: Cardiology

## 2018-07-25 DIAGNOSIS — Z955 Presence of coronary angioplasty implant and graft: Secondary | ICD-10-CM

## 2018-07-25 DIAGNOSIS — I2111 ST elevation (STEMI) myocardial infarction involving right coronary artery: Secondary | ICD-10-CM | POA: Diagnosis not present

## 2018-07-25 NOTE — Progress Notes (Signed)
I have reviewed a Home Exercise Prescription with [name] . Clayton [name] is currently exercising at home.  The patient was advised to walk 2-4 days a week for 30-45 minutes in addition to Cardiac Rehab.  [name] and I discussed how to progress their exercise prescription. The patient stated that they understand the exercise prescription.  We reviewed exercise guidelines, target heart rate during exercise, RPE Scale, weather conditions, NTG use, endpoints for exercise, warmup and cool down.  Patient is encouraged to come to me with any questions. I will continue to follow up with the patient to assist them with progression and safety.    07/25/2018  9:49 AM   Deitra Mayo BS, ACSM CEP

## 2018-07-28 ENCOUNTER — Other Ambulatory Visit: Payer: Self-pay | Admitting: Endocrinology

## 2018-07-28 ENCOUNTER — Ambulatory Visit (HOSPITAL_COMMUNITY): Payer: Medicare PPO

## 2018-07-28 ENCOUNTER — Encounter (HOSPITAL_COMMUNITY)
Admission: RE | Admit: 2018-07-28 | Discharge: 2018-07-28 | Disposition: A | Discharge status: Home / Self Care | Payer: Medicare PPO | Source: Ambulatory Visit | Attending: Cardiology | Admitting: Cardiology

## 2018-07-28 DIAGNOSIS — I2111 ST elevation (STEMI) myocardial infarction involving right coronary artery: Secondary | ICD-10-CM

## 2018-07-28 DIAGNOSIS — Z955 Presence of coronary angioplasty implant and graft: Secondary | ICD-10-CM

## 2018-07-30 ENCOUNTER — Encounter (HOSPITAL_COMMUNITY): Payer: Medicare PPO

## 2018-07-30 ENCOUNTER — Ambulatory Visit (HOSPITAL_COMMUNITY): Payer: Medicare PPO

## 2018-08-01 ENCOUNTER — Encounter (HOSPITAL_COMMUNITY)
Admission: RE | Admit: 2018-08-01 | Discharge: 2018-08-01 | Disposition: A | Discharge status: Home / Self Care | Payer: Medicare PPO | Source: Ambulatory Visit | Attending: Cardiology | Admitting: Cardiology

## 2018-08-01 ENCOUNTER — Ambulatory Visit (HOSPITAL_COMMUNITY): Payer: Medicare PPO

## 2018-08-01 DIAGNOSIS — Z955 Presence of coronary angioplasty implant and graft: Secondary | ICD-10-CM | POA: Diagnosis not present

## 2018-08-01 DIAGNOSIS — I2111 ST elevation (STEMI) myocardial infarction involving right coronary artery: Secondary | ICD-10-CM | POA: Diagnosis not present

## 2018-08-03 ENCOUNTER — Telehealth: Payer: Self-pay | Admitting: Endocrinology

## 2018-08-03 ENCOUNTER — Other Ambulatory Visit: Payer: Self-pay | Admitting: Endocrinology

## 2018-08-03 DIAGNOSIS — E1165 Type 2 diabetes mellitus with hyperglycemia: Secondary | ICD-10-CM

## 2018-08-03 DIAGNOSIS — Z794 Long term (current) use of insulin: Principal | ICD-10-CM

## 2018-08-03 DIAGNOSIS — D508 Other iron deficiency anemias: Secondary | ICD-10-CM

## 2018-08-03 NOTE — Telephone Encounter (Signed)
Erroneous encounter

## 2018-08-04 ENCOUNTER — Ambulatory Visit (HOSPITAL_COMMUNITY): Payer: Medicare PPO

## 2018-08-04 ENCOUNTER — Encounter (HOSPITAL_COMMUNITY)
Admission: RE | Admit: 2018-08-04 | Discharge: 2018-08-04 | Disposition: A | Discharge status: Home / Self Care | Payer: Medicare PPO | Source: Ambulatory Visit | Attending: Cardiology | Admitting: Cardiology

## 2018-08-04 DIAGNOSIS — Z955 Presence of coronary angioplasty implant and graft: Secondary | ICD-10-CM

## 2018-08-04 DIAGNOSIS — I2111 ST elevation (STEMI) myocardial infarction involving right coronary artery: Secondary | ICD-10-CM | POA: Diagnosis not present

## 2018-08-04 NOTE — Progress Notes (Signed)
Daily Session Note  Patient Details  Name: Clayton [name] MRN: [id number] Date of Birth: 1927-12-01 Referring Provider:   Flowsheet Row CARDIAC REHAB PHASE II ORIENTATION from 07/03/2018 in [place]  Referring Provider  Dr. Ellyn Hack      Encounter Date: 08/04/2018  Check In: Session Check In - 08/04/18 0842    Check-In          Supervising physician immediately available to respond to emergencies  [facility] immediately available    Physician(s)  Dr. Tawanna Solo    Location  [facility]    Staff Present  Andi Hence, RN, Bjorn Loser, MS, Exercise Physiologist;Tara Karle Starch, RN, BSN;Brittany Durene Fruits, BS, ACSM CEP, Exercise Physiologist;[name] DiVincenzo, MS, ACSM RCEP, Exercise Physiologist    Medication changes reported      No    Fall or balance concerns reported     No    Tobacco Cessation  No Change    Warm-up and Cool-down  Performed as group-led instruction    Resistance Training Performed  Yes    VAD Patient?  No    PAD/SET Patient?  No        Pain Assessment          Currently in Pain?  No/denies    Multiple Pain Sites  No           Capillary Blood Glucose: No results found for this or any previous visit (from the past 24 hour(s)).    Social History   Tobacco Use  Smoking Status Former Smoker  . Packs/day: 3.00  . Years: 33.00  . Pack years: 99.00  . Types: Cigarettes  . Last attempt to quit: 06/19/1973  . Years since quitting: 45.1  Smokeless Tobacco Former Systems developer  . Quit date: 09/26/1975    Goals Met:  Exercise tolerated well  Goals Unmet:  Not Applicable  Comments: pt exercising above THR today. Highest rate-122bpm, sinus tachycardia. Pt asymptomatic. Pt states he drank 3 cups coffee yesterday which is more than usual. Pt denies missed medications, pain or anxiety. Pt admits to poor PO water intake.   BP:  119/67           HR:    99     Lying  BP:  122/59  HR:   101     Sitting  BP:   108/59   HR:   112     Standing  Pt given gatorade for orthostasis.  Will continue to monitor.   Andi Hence, RN, BSN Cardiac Pulmonary Rehab     Dr. Fransico Him is Medical Director for Cardiac Rehab at [facility].

## 2018-08-06 ENCOUNTER — Encounter (HOSPITAL_COMMUNITY)
Admission: RE | Admit: 2018-08-06 | Discharge: 2018-08-06 | Disposition: A | Discharge status: Home / Self Care | Payer: Medicare PPO | Source: Ambulatory Visit | Attending: Cardiology | Admitting: Cardiology

## 2018-08-06 ENCOUNTER — Telehealth: Payer: Self-pay | Admitting: Endocrinology

## 2018-08-06 ENCOUNTER — Ambulatory Visit (HOSPITAL_COMMUNITY): Payer: Medicare PPO

## 2018-08-06 ENCOUNTER — Other Ambulatory Visit: Payer: Self-pay

## 2018-08-06 DIAGNOSIS — Z955 Presence of coronary angioplasty implant and graft: Secondary | ICD-10-CM

## 2018-08-06 DIAGNOSIS — I2111 ST elevation (STEMI) myocardial infarction involving right coronary artery: Secondary | ICD-10-CM

## 2018-08-06 MED ORDER — INSULIN GLARGINE 100 UNIT/ML ~~LOC~~ SOLN: SUBCUTANEOUS | 3 refills | Status: DC

## 2018-08-06 NOTE — Telephone Encounter (Signed)
rx sent

## 2018-08-06 NOTE — Telephone Encounter (Signed)
MEDICATION: Lantus  PHARMACY:  [facility]   IS THIS A 90 DAY SUPPLY : unknown  IS PATIENT OUT OF MEDICATION: yes  IF NOT; HOW MUCH IS LEFT:   LAST APPOINTMENT DATE: @2 /16/2020  NEXT APPOINTMENT DATE:@2 /20/2020  DO WE HAVE YOUR PERMISSION TO LEAVE A DETAILED MESSAGE: yes  OTHER COMMENTS:    **Let patient know to contact pharmacy at the end of the day to make sure medication is ready. **  ** Please notify patient to allow 48-72 hours to process**  **Encourage patient to contact the pharmacy for refills or they can request refills through [facility]**

## 2018-08-07 ENCOUNTER — Other Ambulatory Visit: Payer: Self-pay

## 2018-08-07 ENCOUNTER — Other Ambulatory Visit (INDEPENDENT_AMBULATORY_CARE_PROVIDER_SITE_OTHER): Payer: Medicare PPO

## 2018-08-07 DIAGNOSIS — Z794 Long term (current) use of insulin: Secondary | ICD-10-CM

## 2018-08-07 DIAGNOSIS — D508 Other iron deficiency anemias: Secondary | ICD-10-CM | POA: Diagnosis not present

## 2018-08-07 DIAGNOSIS — E1165 Type 2 diabetes mellitus with hyperglycemia: Secondary | ICD-10-CM | POA: Diagnosis not present

## 2018-08-07 LAB — CBC WITH DIFFERENTIAL/PLATELET
Basophils Absolute: 0.1 10*3/uL (ref 0.0–0.1)
Basophils Relative: 0.8 % (ref 0.0–3.0)
Eosinophils Absolute: 0.6 10*3/uL (ref 0.0–0.7)
Eosinophils Relative: 4.4 % (ref 0.0–5.0)
HCT: 29.5 % — ABNORMAL LOW (ref 39.0–52.0)
Hemoglobin: 9.7 g/dL — ABNORMAL LOW (ref 13.0–17.0)
Lymphocytes Relative: 10.4 % — ABNORMAL LOW (ref 12.0–46.0)
Lymphs Abs: 1.3 10*3/uL (ref 0.7–4.0)
MCHC: 32.9 g/dL (ref 30.0–36.0)
MCV: 96.1 fl (ref 78.0–100.0)
Monocytes Absolute: 1.1 10*3/uL — ABNORMAL HIGH (ref 0.1–1.0)
Monocytes Relative: 8.8 % (ref 3.0–12.0)
Neutro Abs: 9.6 10*3/uL — ABNORMAL HIGH (ref 1.4–7.7)
Neutrophils Relative %: 75.6 % (ref 43.0–77.0)
Platelets: 413 10*3/uL — ABNORMAL HIGH (ref 150.0–400.0)
RBC: 3.07 Mil/uL — ABNORMAL LOW (ref 4.22–5.81)
RDW: 17 % — AB (ref 11.5–15.5)
WBC: 12.7 10*3/uL — ABNORMAL HIGH (ref 4.0–10.5)

## 2018-08-07 LAB — COMPREHENSIVE METABOLIC PANEL
ALT: 21 U/L (ref 0–53)
AST: 27 U/L (ref 0–37)
Albumin: 3.7 g/dL (ref 3.5–5.2)
Alkaline Phosphatase: 136 U/L — ABNORMAL HIGH (ref 39–117)
BILIRUBIN TOTAL: 0.4 mg/dL (ref 0.2–1.2)
BUN: 40 mg/dL — ABNORMAL HIGH (ref 6–23)
CALCIUM: 9.2 mg/dL (ref 8.4–10.5)
CO2: 23 meq/L (ref 19–32)
Chloride: 105 mEq/L (ref 96–112)
Creatinine, Ser: 2.5 mg/dL — ABNORMAL HIGH (ref 0.40–1.50)
GFR: 24.47 mL/min — ABNORMAL LOW (ref >60.00)
Glucose, Bld: 127 mg/dL — ABNORMAL HIGH (ref 70–99)
Potassium: 4 mEq/L (ref 3.5–5.1)
Sodium: 139 mEq/L (ref 135–145)
Total Protein: 7.3 g/dL (ref 6.0–8.3)

## 2018-08-07 LAB — MICROALBUMIN / CREATININE URINE RATIO
Creatinine,U: 84.3 mg/dL
Microalb Creat Ratio: 6.5 mg/g (ref 0.0–30.0)
Microalb, Ur: 5.4 mg/dL — ABNORMAL HIGH (ref 0.0–1.9)

## 2018-08-07 LAB — HEMOGLOBIN A1C: Hgb A1c MFr Bld: 6.8 % — ABNORMAL HIGH (ref 4.6–6.5)

## 2018-08-07 LAB — FERRITIN: Ferritin: 66.2 ng/mL (ref 22.0–322.0)

## 2018-08-07 MED ORDER — INSULIN GLARGINE 100 UNIT/ML ~~LOC~~ SOLN: SUBCUTANEOUS | 3 refills | Status: DC

## 2018-08-08 ENCOUNTER — Encounter (HOSPITAL_COMMUNITY): Payer: Medicare PPO

## 2018-08-08 ENCOUNTER — Encounter: Payer: Self-pay | Admitting: Cardiology

## 2018-08-08 ENCOUNTER — Telehealth (HOSPITAL_COMMUNITY): Payer: Self-pay | Admitting: Family Medicine

## 2018-08-08 ENCOUNTER — Telehealth: Payer: Self-pay

## 2018-08-08 ENCOUNTER — Ambulatory Visit: Payer: Medicare PPO | Admitting: Cardiology

## 2018-08-08 ENCOUNTER — Ambulatory Visit (HOSPITAL_COMMUNITY): Payer: Medicare PPO

## 2018-08-08 VITALS — BP 116/52 | HR 92 | Ht 70.0 in | Wt 190.0 lb

## 2018-08-08 DIAGNOSIS — R001 Bradycardia, unspecified: Secondary | ICD-10-CM

## 2018-08-08 DIAGNOSIS — I10 Essential (primary) hypertension: Secondary | ICD-10-CM | POA: Diagnosis not present

## 2018-08-08 DIAGNOSIS — I219 Acute myocardial infarction, unspecified: Secondary | ICD-10-CM

## 2018-08-08 DIAGNOSIS — E785 Hyperlipidemia, unspecified: Secondary | ICD-10-CM

## 2018-08-08 DIAGNOSIS — E611 Iron deficiency: Secondary | ICD-10-CM

## 2018-08-08 DIAGNOSIS — N184 Chronic kidney disease, stage 4 (severe): Secondary | ICD-10-CM | POA: Diagnosis not present

## 2018-08-08 DIAGNOSIS — I2510 Atherosclerotic heart disease of native coronary artery without angina pectoris: Secondary | ICD-10-CM

## 2018-08-08 DIAGNOSIS — R0609 Other forms of dyspnea: Secondary | ICD-10-CM | POA: Diagnosis not present

## 2018-08-08 DIAGNOSIS — R0600 Dyspnea, unspecified: Secondary | ICD-10-CM

## 2018-08-08 DIAGNOSIS — E1169 Type 2 diabetes mellitus with other specified complication: Secondary | ICD-10-CM

## 2018-08-08 MED ORDER — ATORVASTATIN CALCIUM 80 MG PO TABS: 40.0000 mg | ORAL_TABLET | Freq: Every day | ORAL | 11 refills | Status: DC

## 2018-08-08 NOTE — Progress Notes (Signed)
PCP: Lujean Amel, MD  Clinic Note: Chief Complaint  Patient presents with  . Follow-up    Still weak from URI and anemia  . Coronary Artery Disease    History of MI with multiple other existing CAD lesions.  . Fatigue    HPI: Clayton [name] is a 85 y.o. male with a PMH below who presents today for 2nd Hospital f/u after Inf STEMI-RCA PCI in Nov 2019.Clayton [name] was last seen on May 08, 2018 by Jory Sims, DNP  Recent Hospitalizations:   ER visit for upper respiratory infection December 27 --has been doing poorly since then.  Referred to GI medicine by nephrologist for dark tarry stools. ->  Eventually was given IV iron CBC Latest Ref Rng & Units 08/07/2018 07/10/2018 06/13/2018  WBC 4.0 - 10.5 K/uL 12.7(H) 12.0(H) 18.5(H)  Hemoglobin 13.0 - 17.0 g/dL 9.7(L) 8.6 Repeated and verified X2.(L) 7.7(L)  Hematocrit 39.0 - 52.0 % 29.5(L) 26.4 Repeated and verified X2.(L) 25.1(L)  Platelets 150.0 - 400.0 K/uL 413.0(H) 394.0 371    Studies Personally Reviewed - (if available, images/films reviewed: From Epic Chart or Care Everywhere)  Holter December 2019: Sinus rhythm noted with sinus bradycardia.  Also sinus rhythm with 2-1 AV block noted.  Maximum heart rate was sinus tachycardia 123 bpm.  Rare PVCs accelerated idioventricular rhythm noted.  Intermittent Wenckebach block noted.  Echo 04/21/2018: EF 50-55%. Posterior Wall HK. Severe RV HK, dilated IVC. Temp wire in place  Cath 04/20/2018: CULPRIT LESION: mid RCA-1 lesion is 100% stenosed. (Prox RCA lesion is 55% stenosed tapered to occlusion, Mid RCA-2 lesion is 80% stenosed distal to occlusion)  DES PCI @ upstream tapered segment and the downstream lesion, SYNERGY DES 3X38. -> 3.6 mm  Post intervention, there is a 0% residual stenosis.  -----------RESIDUAL DISEASE---------------------------------  Mid LM to Ost LAD lesion is 40% stenosed with 85% stenosed side branch in Ost Cx.  Ost Cx to Prox Cx lesion is 65%  stenosed with 85% stenosed side branch in Ost 1st Mrg.  Prox LAD-1 lesion is 60% stenosed -just beyond 1st Diag, followed by Prox LAD-2 lesion is 50% stenosed.  Dist LAD lesion is 75% stenosed.  Ost 1st Diag-1 lesion is 65% stenosed followed by focal 90% stenosis at takeoff of small branch.  -----------------------------------------------------------------  LV end diastolic pressure is mildly elevated.        Interval History: Clayton [name] presents here today for follow-up for his CAD, but really has been hampered since December when he had the illness leading to his ER visit.  He was then subsequently found to be anemic treated with IV iron.  He is finally now starting to get some energy level back.  But he has been profoundly weak ever since.  He is now starting to cardiac rehab, but is really fatigued and weak.  He is now walking with a walker which is new for him.  He has also been sleeping quite a lot and spending most the time in his reclining chair.  He says he is finally starting a little bit of a energy level back but is noted significant fatigue.  He really has not had any PND, orthopnea or edema to suggest heart failure symptoms nor has he had any recurrent anginal symptoms.  Only using minimal as needed Lasix. No palpitations, lightheadedness, dizziness, weakness or syncope/near syncope. No TIA/amaurosis fugax symptoms.  No claudication.  He has had melanotic stools but no hematochezia.  No hematuria.  ROS: A  comprehensive was performed. Review of Systems  Constitutional: Positive for malaise/fatigue. Negative for weight loss.  HENT: Negative for congestion.   Respiratory: Positive for cough (Finally recovering from the cough in December). Negative for shortness of breath (Only with exertion).   Gastrointestinal: Positive for abdominal pain, constipation (With oral iron.  He is thankful this was stopped.) and melena. Negative for heartburn and nausea.  Genitourinary: Negative for  dysuria.  Musculoskeletal: Positive for joint pain (Hips and knees are weak.). Negative for myalgias.  Neurological: Positive for dizziness (Getting much better now that his hemoglobin is improved.) and weakness (Generalized both upper and lower body weakness.).  Psychiatric/Behavioral: Positive for memory loss. Negative for depression. The patient is not nervous/anxious and does not have insomnia.   All other systems reviewed and are negative.   I have reviewed and (if needed) personally updated the patient's problem list, medications, allergies, past medical and surgical history, social and family history.   Past Medical History:  Diagnosis Date  . Anemia   . BPH (benign prostatic hyperplasia)   . CAD (coronary artery disease)    hx of stemi  04-2018   . Chronic renal failure   . Chronic renal insufficiency   . COPD (chronic obstructive pulmonary disease) ([place])   . DDD (degenerative disc disease), lumbar   . Diabetes mellitus   . Hyperlipidemia   . Hyperparathyroidism ([place])   . Hypertension   . IDA (iron deficiency anemia)   . Neuromuscular disorder ([place])   . OSA on CPAP   . Osteomyelitis of forearm ([place])   . Osteoporosis     Past Surgical History:  Procedure Laterality Date  . CORONARY STENT INTERVENTION N/A 04/20/2018   Procedure: CORONARY STENT INTERVENTION;  Surgeon: Leonie Man, MD;  Location: [facility] INVASIVE CV LAB;;;    . [facility] [place] ACUTE MI REVASCULARIZATION N/A 04/20/2018   Procedure: Coronary/Graft Acute MI Revascularization;  Surgeon: Leonie Man, MD;  Location: [facility] CV LAB;  Service: Cardiovascular;  Laterality: N/A; --> after initial PTCA restoring flow down the RCA, there was diffuse proximal to mid disease treated with a DES SYNERGY 3 X 38 ( 3.87mm)  . EYE SURGERY    . HOLTER MONITOR  05/2018   Sinus rhythm noted with sinus bradycardia.  Also sinus rhythm with 2-1 AV block noted.  Maximum heart rate was sinus tachycardia 123 bpm.  Rare PVCs  accelerated idioventricular rhythm noted.  Intermittent Wenckebach block noted.  . I&D EXTREMITY Right 08/13/2016   Procedure: IRRIGATION AND DEBRIDEMENT RIGHT ELBOW AND HAND;  Surgeon: Milly Jakob, MD;  Location: [facility];  Service: Orthopedics;  Laterality: Right;  . LEFT HEART CATH AND CORONARY ANGIOGRAPHY N/A 04/20/2018   Procedure: LEFT HEART CATH AND CORONARY ANGIOGRAPHY;  Surgeon: Leonie Man, MD;  Location: [facility] CV LAB; mRCA 100%&80% (after 55% tapering in pRCA), mLM-ostLAD 40% w/ 85% ostCx, ost-pCx 65% @ Om1 w/ ost 85%. pLAD 60% & 50% after D1 with distal 75%, ostD1 65% & 90% after small side branch.    . TRANSTHORACIC ECHOCARDIOGRAM  04/21/2018   EF 50-55%. Posterior Wall HK. Severe RV HK, dilated IVC. Temp wire in place  . VASECTOMY    . VIDEO BRONCHOSCOPY Bilateral 08/25/2012   Procedure: VIDEO BRONCHOSCOPY WITHOUT FLUORO;  Surgeon: Brand Males, MD;  Location: [facility];  Service: Cardiopulmonary;  Laterality: Bilateral;    Current Meds  Medication Sig  . ACCU-CHEK SOFTCLIX LANCETS lancets TEST BLOOD SUGAR FOUR TIMES DAILY  . albuterol (PROVENTIL HFA;VENTOLIN  HFA) 108 (90 Base) MCG/ACT inhaler Inhale 1-2 puffs into the lungs every 6 (six) hours as needed for wheezing or shortness of breath.  . Alcohol Swabs (B-D SINGLE USE SWABS REGULAR) PADS USE 7 SWABS DAILY AS DIRECTED  . atorvastatin (LIPITOR) 80 MG tablet Take 0.5 tablets (40 mg total) by mouth daily.  . budesonide-formoterol (SYMBICORT) 160-4.5 MCG/ACT inhaler Inhale 2 puffs into the lungs 2 (two) times daily.  . cholecalciferol (VITAMIN D) 1000 units tablet Take 2,000 Units by mouth daily.  . DROPLET INSULIN SYRINGE 31G X 5/16" 0.3 ML MISC USE THREE TIMES DAILY  . fesoterodine (TOVIAZ) 4 MG TB24 tablet Take 4 mg by mouth daily.  . finasteride (PROSCAR) 5 MG tablet TAKE 1 TABLET (5 MG TOTAL) BY MOUTH DAILY. (Patient taking differently: Take 5 mg by mouth daily. )  . furosemide (LASIX) 20 MG tablet Take 1  tablet (20 mg total) by mouth daily. Take 2 tabs 5 times weekly. (Patient taking differently: Take 20 mg by mouth See admin instructions. Take 1 tabs 5 times weekly (Sunday, Monday, Tuesday, Thursday, and Saturday))  . glucose blood test strip Use as instructed to test blood sugars 4 times daily  . insulin aspart (NOVOLOG) 100 UNIT/ML injection TAKE 8 UNITS UNDER THE SKIN DAILY Monday-Friday AND ON Saturday AND Sunday TAKE 6 UNITS AT BREAKFAST AND LUNCH, AND 8 UNITS AT DINNER. (Patient taking differently: TAKE 7 UNITS IN THE MORNING, 6 UNITS AT LUNCH, AND 8 UNITS AT SUPPER.)  . insulin glargine (LANTUS) 100 UNIT/ML injection TAKE 24 UNITS EVERY MORNING.DRAW FROM VIAL.  Marland Kitchen isosorbide mononitrate (IMDUR) 30 MG 24 hr tablet Take 1 tablet (30 mg total) by mouth daily.  . Melatonin 5 MG TABS Take 2 tablets by mouth at bedtime.   . Multiple Vitamin (MULTIVITAMIN WITH MINERALS) TABS tablet Take 1 tablet by mouth daily.  Marland Kitchen omeprazole (PRILOSEC) 20 MG capsule Take 1 capsule (20 mg total) by mouth daily.  . ticagrelor (BRILINTA) 90 MG TABS tablet Take 1 tablet (90 mg total) by mouth 2 (two) times daily.  . [DISCONTINUED] aspirin 81 MG chewable tablet Chew 1 tablet (81 mg total) by mouth daily.  . [DISCONTINUED] atorvastatin (LIPITOR) 80 MG tablet Take 1 tablet (80 mg total) by mouth daily.    Allergies  Allergen Reactions  . Doxycycline Rash  . Levaquin [Levofloxacin In D5w]     Caused chest pain and heartburn  . Nitrous Oxide Other (See Comments)    Reaction:  Unknown   . Penicillins Other (See Comments)    Reaction:  Unknown Has patient had a PCN reaction causing immediate rash, facial/tongue/throat swelling, SOB or lightheadedness with hypotension: Unsure Has patient had a PCN reaction causing severe rash involving mucus membranes or skin necrosis: Unsure Has patient had a PCN reaction that required hospitalization Unsure  Has patient had a PCN reaction occurring within the last 10 years: No If all  of the above answers are "NO", then may proceed with Cephalosporin use.  . Tape Other (See Comments)    Reaction:  Tears pts skin   . Diltiazem Rash    Social History   Tobacco Use  . Smoking status: Former Smoker    Packs/day: 3.00    Years: 33.00    Pack years: 99.00    Types: Cigarettes    Last attempt to quit: 06/19/1973    Years since quitting: 45.1  . Smokeless tobacco: Former Systems developer    Quit date: 09/26/1975  Substance Use Topics  .  Alcohol use: No  . Drug use: No   Social History   Social History Narrative   He is accompanied by his daughter who seems to be quite involved with his care.      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 He also went to the [facility] several times a week until he began to have symptoms.     family history includes CVA in his father; Cancer in his sister; Diabetes Mellitus II in an other family member; Hypertension in an other family member; Other in his brother and father.  Wt Readings from Last 3 Encounters:  08/08/18 190 lb (86.2 kg)  07/10/18 194 lb 2 oz (88.1 kg)  07/03/18 191 lb 2.2 oz (86.7 kg)    PHYSICAL EXAM BP (!) 116/52   Pulse 92   Ht 5\' 10"  (1.778 m)   Wt 190 lb (86.2 kg)   BMI 27.26 kg/m  Physical Exam  Constitutional: He is oriented to person, place, and time. No distress.  Somewhat weak, ill-appearing gentleman who is in no acute distress.  Is sitting in a chair, but walked in with a walker.  Seems very weak.  Very soft-spoken.  HENT:  Head: Normocephalic and atraumatic.  Neck: Normal range of motion. Neck supple. Hepatojugular reflux (Minimal) present. No JVD present. Carotid bruit is not present.  Cardiovascular: Normal rate, regular rhythm, normal heart sounds and normal pulses.  Occasional extrasystoles are present. PMI is not displaced. Exam reveals no gallop and no friction rub.  No murmur heard. Pulmonary/Chest: Effort normal and breath sounds normal. No respiratory distress.  He exhibits no tenderness.  Mildly diminished breath sounds in the bases but no obvious rales or rhonchi  Abdominal: Soft. Bowel sounds are normal. He exhibits no distension. There is no abdominal tenderness. There is no rebound.  Musculoskeletal: Normal range of motion.        General: No edema (Trivial).  Neurological: He is alert and oriented to person, place, and time.  Psychiatric: He has a normal mood and affect. His behavior is normal. Judgment and thought content normal.  Vitals reviewed.    Adult ECG Report Not checked  Other studies Reviewed: Additional studies/ records that were reviewed today include:  Recent Labs:   Lab Results  Component Value Date   CREATININE 2.50 (H) 08/07/2018   BUN 40 (H) 08/07/2018   NA 139 08/07/2018   K 4.0 08/07/2018   CL 105 08/07/2018   CO2 23 08/07/2018   Lab Results  Component Value Date   CHOL 108 05/09/2018   HDL 35.30 (L) 05/09/2018   LDLCALC 56 05/09/2018   TRIG 83.0 05/09/2018   CHOLHDL 3 05/09/2018    ASSESSMENT / PLAN: Problem List Items Addressed This Visit    CAD, multiple vessel - Primary (Chronic)    Multivessel disease with DES PCI to the RCA but existing disease still in the ostial circumflex as well as diffuse disease in the LAD.  At this point, I think I would like for him to stabilize out with normal hemoglobin levels and in order to minimize about time he needs to be on dual antibiotic therapy, would prefer to avoid further PCI unless he has active angina symptoms.  We talked about the different options, and he and his daughter both agree that they would probably prefer to forego any further staged PCI or even considering bypass surgery.  He does not feel strong enough to go through bypass  surgery.  Plan: He is on 80 mg atorvastatin which will reduce 40 mg because of fatigue. He is on aspirin plus Brilinta and I think we can stop aspirin at this point 3 months out based on his anemia. On low-dose Imdur, and I will  consider starting low-dose beta-blocker and follow-up if he is less fatigued once his anemia has resolved.      Relevant Medications   atorvastatin (LIPITOR) 80 MG tablet   Other Relevant Orders   Lipid panel   Comprehensive metabolic panel   Chronic kidney disease, stage IV (severe) (HCC) (Chronic)    He has a creatinine of 2.5 which makes further catheterization and PCI poor options for fear potentially putting him in dialysis.  This is yet again another reason to plan for medical management as opposed to more invasive options.    I do not think that he would be a very favorable CABG candidate nor would he want to undergo that.  That would probably be the best option however from a revascularization standpoint as there is no real good option with the circumflex besides PTCA.    He probably has some longstanding mild anemia as result of his renal insufficiency.  Thankfully his CBC is being followed by nephrology and PCP.      Dyspnea   Essential hypertension (Chronic)    At this point, with his fatigue and weakness err on the side of permissive hypertension.  His blood pressure is only 116/52 without any medications besides Imdur.  No plan to start antihypertensive agent today, but would potentially start low-dose Toprol versus bisoprolol at next visit.      Relevant Medications   atorvastatin (LIPITOR) 80 MG tablet   Other Relevant Orders   Lipid panel   Comprehensive metabolic panel   Hyperlipidemia associated with type 2 diabetes mellitus ([place]) (Chronic)    He is currently on 80 mg atorvastatin.  With him having fatigue and weakness, I think it would like to just reduce the dose to 40mg .  He has a LDL of 56 by last count.  He should be due for recheck set of labs prior to 29-month follow-up.  Hopefully he will also have another CBC at that time.      Relevant Medications   atorvastatin (LIPITOR) 80 MG tablet   Other Relevant Orders   Lipid panel   Comprehensive metabolic panel     Iron deficiency    Received IV iron, likely ordered by his nephrologist.  Most recent CBC showed stable hemoglobin level.   Follow-up CBC is again be followed by PCP/nephrologist.  For now will stop aspirin, but will Brilinta for now.  If there is further bleeding, would probably convert to Plavix.      Recent right ventricular myocardial infarction [facility]) (Chronic)    He had a pretty significant RV infarct at the time of his MI.  This may be partly leading to his weakness.  I would like to reevaluate his RV function and also his inferior wall motion with a follow-up echocardiogram.      Relevant Medications   atorvastatin (LIPITOR) 80 MG tablet   Symptomatic bradycardia    He did have some significant conduction delay, the time of his MI, that has improved somewhat but he still has had some 2-1 AV block as well as Wenckebach block noted on his monitor.    Therefore reluctant to consider adding a beta-blocker, but may be able to once we have gotten him further away from  his MI.  Currently his heart rate is 90 bpm.         I spent a total of 45 minutes with the patient and chart review. >  50% of the time was spent in direct patient consultation.  We had lots to review including his cath films and discussing what to do with existing disease.  We talked about options of stress test versus medical management alone.  We talked about pros and cons of additional attempted PCI.  I explained to him that his ostial circumflex lesion is not very amenable to PCI, PTCA would be the only potential option.  In light of him having melanotic stools and a drop in his hemoglobin on dual endplate therapy, I think we would want to minimize how long he needs to be on this.  Current medicines are reviewed at length with the patient today.  (+/- concerns) anemic from blood in stool The following changes have been made:  See below  Patient Instructions  Medication Instructions:  STOP ASPRIN  DECREASE  ATORVASTATIN  1/2 TABLET OF 80 MG ( 40 MG) DAILY  If you need a refill on your cardiac medications before your next appointment, please call your pharmacy.   Lab work: CMP LIPID IN 3 MONTHS ( MAY 2020)  OKAY TO DO AT DR Ronnie Derby OFFICE  If you have labs (blood work) drawn today and your tests are completely normal, you will receive your results only by: Marland Kitchen MyChart Message (if you have MyChart) OR . A paper copy in the mail If you have any lab test that is abnormal or we need to change your treatment, we will call you to review the results.  Testing/Procedures: NOT NEEDED    Follow-Up: At [facility], you and your health needs are our priority.  As part of our continuing mission to provide you with exceptional heart care, we have created designated Provider Care Teams.  These Care Teams include your primary Cardiologist (physician) and Advanced Practice Providers (APPs -  Physician Assistants and Nurse Practitioners) who all work together to provide you with the care you need, when you need it. . Your physician recommends that you schedule a follow-up appointment in Nov 11 2018 AT 3 PM .   Any Other Special Instructions Will Be Listed Below (If Applicable).     Studies Ordered:   Orders Placed This Encounter  Procedures  . Lipid panel  . Comprehensive metabolic panel      Glenetta Hew, M.D., M.S. Interventional Cardiologist   Pager # [phone] Phone # [phone] [address] [place], [place] [address]   Thank you for choosing Heartcare at [facility]!!

## 2018-08-08 NOTE — Patient Instructions (Addendum)
Medication Instructions:  STOP ASPRIN  DECREASE ATORVASTATIN  1/2 TABLET OF 80 MG ( 40 MG) DAILY  If you need a refill on your cardiac medications before your next appointment, please call your pharmacy.   Lab work: CMP LIPID IN 3 MONTHS ( MAY 2020)  OKAY TO DO AT DR Ronnie Derby OFFICE  If you have labs (blood work) drawn today and your tests are completely normal, you will receive your results only by: Marland Kitchen MyChart Message (if you have MyChart) OR . A paper copy in the mail If you have any lab test that is abnormal or we need to change your treatment, we will call you to review the results.  Testing/Procedures: NOT NEEDED    Follow-Up: At [facility], you and your health needs are our priority.  As part of our continuing mission to provide you with exceptional heart care, we have created designated Provider Care Teams.  These Care Teams include your primary Cardiologist (physician) and Advanced Practice Providers (APPs -  Physician Assistants and Nurse Practitioners) who all work together to provide you with the care you need, when you need it. . Your physician recommends that you schedule a follow-up appointment in Nov 11 2018 AT 3 PM .   Any Other Special Instructions Will Be Listed Below (If Applicable).

## 2018-08-08 NOTE — Telephone Encounter (Signed)
Labs sent to Dr. Joelyn Oms as requested.

## 2018-08-08 NOTE — Telephone Encounter (Signed)
-----   Message from [place], MD sent at 08/08/2018  1:13 PM EST ----- Regarding: RE: Labs Yes, I see them.  His Hgb and ferritin are improved after recent IV iron treatment.  Please print those and send them to Dr. Joelyn Oms at [facility], along with the [place] I did after Jan office visit.  His nephrologist will be managing his anemia (outlined in my recent office consult note, which was send to [place]).  Thanks  - HD ----- Message ----- From: Algernon Huxley, RN Sent: 08/08/2018  11:29 AM EST To: Doran Stabler, MD Subject: FW: Labs                                       Pt due for labs, CBC and ferritin. Looks like he had these drawn yesterday at another office. ----- Message ----- From: Irene Shipper, MD Sent: 08/08/2018  11:20 AM EST To: Algernon Huxley, RN Subject: RE: Labs                                       Looks like Dr. Loletha Carrow patient ----- Message ----- From: Algernon Huxley, RN Sent: 08/08/2018  11:02 AM EST To: Irene Shipper, MD Subject: FW: Labs                                       Pt due for labs, CBC and ferritin. Looks like he had these drawn yesterday at another office. ----- Message ----- From: Algernon Huxley, RN Sent: 08/08/2018 To: Algernon Huxley, RN Subject: Labs                                           Pt needs labs.

## 2018-08-10 ENCOUNTER — Encounter: Payer: Self-pay | Admitting: Cardiology

## 2018-08-10 NOTE — Assessment & Plan Note (Signed)
At this point, with his fatigue and weakness err on the side of permissive hypertension.  His blood pressure is only 116/52 without any medications besides Imdur.  No plan to start antihypertensive agent today, but would potentially start low-dose Toprol versus bisoprolol at next visit.

## 2018-08-10 NOTE — Assessment & Plan Note (Signed)
He has a creatinine of 2.5 which makes further catheterization and PCI poor options for fear potentially putting him in dialysis.  This is yet again another reason to plan for medical management as opposed to more invasive options.    I do not think that he would be a very favorable CABG candidate nor would he want to undergo that.  That would probably be the best option however from a revascularization standpoint as there is no real good option with the circumflex besides PTCA.    He probably has some longstanding mild anemia as result of his renal insufficiency.  Thankfully his CBC is being followed by nephrology and PCP.

## 2018-08-10 NOTE — Assessment & Plan Note (Addendum)
He is currently on 80 mg atorvastatin.  With him having fatigue and weakness, I think it would like to just reduce the dose to 40mg .  He has a LDL of 56 by last count.  He should be due for recheck set of labs prior to 4-month follow-up.  Hopefully he will also have another CBC at that time.

## 2018-08-10 NOTE — Assessment & Plan Note (Signed)
He had a pretty significant RV infarct at the time of his MI.  This may be partly leading to his weakness.  I would like to reevaluate his RV function and also his inferior wall motion with a follow-up echocardiogram.

## 2018-08-10 NOTE — Assessment & Plan Note (Signed)
Received IV iron, likely ordered by his nephrologist.  Most recent CBC showed stable hemoglobin level.   Follow-up CBC is again be followed by PCP/nephrologist.  For now will stop aspirin, but will Brilinta for now.  If there is further bleeding, would probably convert to Plavix.

## 2018-08-10 NOTE — Assessment & Plan Note (Signed)
He did have some significant conduction delay, the time of his MI, that has improved somewhat but he still has had some 2-1 AV block as well as Wenckebach block noted on his monitor.    Therefore reluctant to consider adding a beta-blocker, but may be able to once we have gotten him further away from his MI.  Currently his heart rate is 90 bpm.

## 2018-08-10 NOTE — Assessment & Plan Note (Signed)
Multivessel disease with DES PCI to the RCA but existing disease still in the ostial circumflex as well as diffuse disease in the LAD.  At this point, I think I would like for him to stabilize out with normal hemoglobin levels and in order to minimize about time he needs to be on dual antibiotic therapy, would prefer to avoid further PCI unless he has active angina symptoms.  We talked about the different options, and he and his daughter both agree that they would probably prefer to forego any further staged PCI or even considering bypass surgery.  He does not feel strong enough to go through bypass surgery.  Plan: He is on 80 mg atorvastatin which will reduce 40 mg because of fatigue. He is on aspirin plus Brilinta and I think we can stop aspirin at this point 3 months out based on his anemia. On low-dose Imdur, and I will consider starting low-dose beta-blocker and follow-up if he is less fatigued once his anemia has resolved.

## 2018-08-11 ENCOUNTER — Encounter (HOSPITAL_COMMUNITY)
Admission: RE | Admit: 2018-08-11 | Discharge: 2018-08-11 | Disposition: A | Discharge status: Home / Self Care | Payer: Medicare PPO | Source: Ambulatory Visit | Attending: Cardiology | Admitting: Cardiology

## 2018-08-11 ENCOUNTER — Ambulatory Visit (HOSPITAL_COMMUNITY): Payer: Medicare PPO

## 2018-08-11 DIAGNOSIS — Z955 Presence of coronary angioplasty implant and graft: Secondary | ICD-10-CM

## 2018-08-11 DIAGNOSIS — I2111 ST elevation (STEMI) myocardial infarction involving right coronary artery: Secondary | ICD-10-CM

## 2018-08-12 ENCOUNTER — Other Ambulatory Visit: Payer: Self-pay

## 2018-08-12 ENCOUNTER — Ambulatory Visit: Payer: Medicare PPO | Admitting: Endocrinology

## 2018-08-13 ENCOUNTER — Ambulatory Visit (HOSPITAL_COMMUNITY): Payer: Medicare PPO

## 2018-08-13 ENCOUNTER — Encounter (HOSPITAL_COMMUNITY)
Admission: RE | Admit: 2018-08-13 | Discharge: 2018-08-13 | Disposition: A | Discharge status: Home / Self Care | Payer: Medicare PPO | Source: Ambulatory Visit | Attending: Cardiology | Admitting: Cardiology

## 2018-08-13 DIAGNOSIS — Z955 Presence of coronary angioplasty implant and graft: Secondary | ICD-10-CM

## 2018-08-13 DIAGNOSIS — I2111 ST elevation (STEMI) myocardial infarction involving right coronary artery: Secondary | ICD-10-CM

## 2018-08-15 ENCOUNTER — Ambulatory Visit (HOSPITAL_COMMUNITY): Payer: Medicare PPO

## 2018-08-15 ENCOUNTER — Encounter (HOSPITAL_COMMUNITY)
Admission: RE | Admit: 2018-08-15 | Discharge: 2018-08-15 | Disposition: A | Discharge status: Home / Self Care | Payer: Medicare PPO | Source: Ambulatory Visit | Attending: Cardiology | Admitting: Cardiology

## 2018-08-15 DIAGNOSIS — I2111 ST elevation (STEMI) myocardial infarction involving right coronary artery: Secondary | ICD-10-CM

## 2018-08-15 DIAGNOSIS — Z955 Presence of coronary angioplasty implant and graft: Secondary | ICD-10-CM

## 2018-08-18 ENCOUNTER — Ambulatory Visit (HOSPITAL_COMMUNITY): Payer: Medicare PPO

## 2018-08-18 ENCOUNTER — Encounter (HOSPITAL_COMMUNITY)
Admission: RE | Admit: 2018-08-18 | Discharge: 2018-08-18 | Disposition: A | Discharge status: Home / Self Care | Payer: Medicare PPO | Source: Ambulatory Visit | Attending: Cardiology | Admitting: Cardiology

## 2018-08-18 DIAGNOSIS — Z955 Presence of coronary angioplasty implant and graft: Secondary | ICD-10-CM

## 2018-08-18 DIAGNOSIS — I2111 ST elevation (STEMI) myocardial infarction involving right coronary artery: Secondary | ICD-10-CM | POA: Insufficient documentation

## 2018-08-20 ENCOUNTER — Telehealth (HOSPITAL_COMMUNITY): Payer: Self-pay | Admitting: Family Medicine

## 2018-08-20 ENCOUNTER — Encounter (HOSPITAL_COMMUNITY): Payer: Medicare PPO

## 2018-08-20 ENCOUNTER — Ambulatory Visit (HOSPITAL_COMMUNITY): Payer: Medicare PPO

## 2018-08-20 NOTE — Progress Notes (Signed)
Cardiac Individual Treatment Plan  Patient Details  Name: Clayton [name] MRN: [id number] Date of Birth: 30-Aug-1927 Referring Provider:   Flowsheet Row CARDIAC REHAB PHASE II ORIENTATION from 07/03/2018 in [facility]  Referring Provider  Dr. Ellyn Hack      Initial Encounter Date:  Flowsheet Row CARDIAC REHAB PHASE II ORIENTATION from 07/03/2018 in [facility]  Date  07/03/18      Visit Diagnosis: Status post coronary artery stent placement, s/p DES RCA 04/20/18  ST elevation myocardial infarction involving right coronary artery ([place]), 04/20/18  Patient's Home Medications on Admission:  Current Outpatient Medications:  .  ACCU-CHEK SOFTCLIX LANCETS lancets, TEST BLOOD SUGAR FOUR TIMES DAILY, Disp: 400 each, Rfl: 1 .  albuterol (PROVENTIL HFA;VENTOLIN HFA) 108 (90 Base) MCG/ACT inhaler, Inhale 1-2 puffs into the lungs every 6 (six) hours as needed for wheezing or shortness of breath., Disp: , Rfl:  .  Alcohol Swabs (B-D SINGLE USE SWABS REGULAR) PADS, USE 7 SWABS DAILY AS DIRECTED, Disp: 600 each, Rfl: 0 .  atorvastatin (LIPITOR) 80 MG tablet, Take 0.5 tablets (40 mg total) by mouth daily., Disp: 30 tablet, Rfl: 11 .  budesonide-formoterol (SYMBICORT) 160-4.5 MCG/ACT inhaler, Inhale 2 puffs into the lungs 2 (two) times daily., Disp: 10.2 g, Rfl: 5 .  cholecalciferol (VITAMIN D) 1000 units tablet, Take 2,000 Units by mouth daily., Disp: , Rfl:  .  DROPLET INSULIN SYRINGE 31G X 5/16" 0.3 ML MISC, USE THREE TIMES DAILY, Disp: 300 each, Rfl: 2 .  fesoterodine (TOVIAZ) 4 MG TB24 tablet, Take 4 mg by mouth daily., Disp: , Rfl:  .  finasteride (PROSCAR) 5 MG tablet, TAKE 1 TABLET (5 MG TOTAL) BY MOUTH DAILY. (Patient taking differently: Take 5 mg by mouth daily. ), Disp: 90 tablet, Rfl: 1 .  furosemide (LASIX) 20 MG tablet, Take 1 tablet (20 mg total) by mouth daily. Take 2 tabs 5 times weekly. (Patient taking differently: Take 20 mg by  mouth See admin instructions. Take 1 tabs 5 times weekly (Sunday, Monday, Tuesday, Thursday, and Saturday)), Disp: 30 tablet, Rfl: 3 .  glucose blood test strip, Use as instructed to test blood sugars 4 times daily, Disp: 150 each, Rfl: 12 .  insulin aspart (NOVOLOG) 100 UNIT/ML injection, TAKE 8 UNITS UNDER THE SKIN DAILY Monday-Friday AND ON Saturday AND Sunday TAKE 6 UNITS AT BREAKFAST AND LUNCH, AND 8 UNITS AT DINNER. (Patient taking differently: TAKE 7 UNITS IN THE MORNING, 6 UNITS AT LUNCH, AND 8 UNITS AT SUPPER.), Disp: 50 mL, Rfl: 2 .  insulin glargine (LANTUS) 100 UNIT/ML injection, Inject 26 Units into the skin daily. INJECT 26 UNITS UNDER THE SKIN EVERY MORNING., Disp: , Rfl:  .  isosorbide mononitrate (IMDUR) 30 MG 24 hr tablet, Take 1 tablet (30 mg total) by mouth daily., Disp: 30 tablet, Rfl: 11 .  Melatonin 5 MG TABS, Take 2 tablets by mouth at bedtime. , Disp: , Rfl:  .  Multiple Vitamin (MULTIVITAMIN WITH MINERALS) TABS tablet, Take 1 tablet by mouth daily., Disp: , Rfl:  .  omeprazole (PRILOSEC) 20 MG capsule, Take 1 capsule (20 mg total) by mouth daily., Disp: 30 capsule, Rfl: 3 .  ticagrelor (BRILINTA) 90 MG TABS tablet, Take 1 tablet (90 mg total) by mouth 2 (two) times daily., Disp: 180 tablet, Rfl: 3  Past Medical History: Past Medical History:  Diagnosis Date  . Anemia   . BPH (benign prostatic hyperplasia)   . CAD (coronary  artery disease)    hx of stemi  04-2018   . Chronic renal failure   . Chronic renal insufficiency   . COPD (chronic obstructive pulmonary disease) ([place])   . DDD (degenerative disc disease), lumbar   . Diabetes mellitus   . Hyperlipidemia   . Hyperparathyroidism ([place])   . Hypertension   . IDA (iron deficiency anemia)   . Neuromuscular disorder ([place])   . OSA on CPAP   . Osteomyelitis of forearm ([place])   . Osteoporosis     Tobacco Use: Social History   Tobacco Use  Smoking Status Former Smoker  . Packs/day: 3.00  . Years: 33.00  . Pack  years: 99.00  . Types: Cigarettes  . Last attempt to quit: 06/19/1973  . Years since quitting: 45.2  Smokeless Tobacco Former Systems developer  . Quit date: 09/26/1975    Labs: Recent Review Flowsheet Data    Labs for ITP Cardiac and Pulmonary Rehab Latest Ref Rng & Units 02/07/2018 04/20/2018 04/21/2018 05/09/2018 08/07/2018   Cholestrol 0 - 200 mg/dL - 121 - 108 -   LDLCALC 0 - 99 mg/dL - 62 - 56 -   HDL >39.00 mg/dL - 37(L) - 35.30(L) -   Trlycerides 0.0 - 149.0 mg/dL - 111 - 83.0 -   Hemoglobin A1c 4.6 - 6.5 % 6.8(H) - 6.3(H) 6.6(H) 6.8(H)   PHART 7.350 - 7.450 - - - - -   PCO2ART 35.0 - 45.0 mmHg - - - - -   HCO3 20.0 - 24.0 mEq/L - - - - -   TCO2 22 - 32 mmol/L - 20(L) - - -   ACIDBASEDEF 0.0 - 2.0 mmol/L - - - - -   O2SAT % - - - - -      Capillary Blood Glucose: Lab Results  Component Value Date   GLUCAP 216 (H) 07/18/2018   GLUCAP 154 (H) 07/16/2018   GLUCAP 141 (H) 07/14/2018   GLUCAP 127 (H) 07/11/2018   GLUCAP 185 (H) 07/11/2018     Exercise Target Goals: Exercise Program Goal: Individual exercise prescription set using results from initial 6 min walk test and THRR while considering  patient's activity barriers and safety.   Exercise Prescription Goal: Initial exercise prescription builds to 30-45 minutes a day of aerobic activity, 2-3 days per week.  Home exercise guidelines will be given to patient during program as part of exercise prescription that the participant will acknowledge.  Activity Barriers & Risk Stratification: Activity Barriers & Cardiac Risk Stratification - 07/03/18 1023    Activity Barriers & Cardiac Risk Stratification          Activity Barriers  Muscular Weakness;Deconditioning;Balance Concerns;History of Falls;Assistive Device    Cardiac Risk Stratification  High           6 Minute Walk: 6 Minute Walk    6 Minute Walk    Row Name 07/03/18 1021   Phase  Initial   Distance  600 feet   Walk Time  6 minutes   # of Rest Breaks  1   MPH  1.1    METS  0.8   RPE  13   VO2 Peak  2.84   Symptoms  Yes (comment)   Comments  Fatigue   Resting HR  98 bpm   Resting BP  120/70   Resting Oxygen Saturation   97 %   Exercise Oxygen Saturation  during 6 min walk  97 %   Max Ex. HR  112 bpm  Max Ex. BP  124/74   2 Minute Post BP  108/56          Oxygen Initial Assessment:   Oxygen Re-Evaluation:   Oxygen Discharge (Final Oxygen Re-Evaluation):   Initial Exercise Prescription: Initial Exercise Prescription - 07/03/18 1000    Date of Initial Exercise RX and Referring Provider          Date  07/03/18    Referring Provider  Dr. Ellyn Hack    Expected Discharge Date  10/10/18        NuStep          Level  2    SPM  75    Minutes  20    METs  0.8        Arm Ergometer          Level  1    Watts  8    Minutes  10    METs  1.6        Prescription Details          Frequency (times per week)  3    Duration  Progress to 30 minutes of continuous aerobic without signs/symptoms of physical distress        Intensity          THRR 40-80% of Max Heartrate  53-106    Ratings of Perceived Exertion  11-13        Progression          Progression  Continue to progress workloads to maintain intensity without signs/symptoms of physical distress.        Resistance Training          Training Prescription  Yes    Weight  2 lbs    Reps  10-15           Perform Capillary Blood Glucose checks as needed.  Exercise Prescription Changes: Exercise Prescription Changes    Response to Exercise    Row Name 07/07/18 0900 07/21/18 1000 07/25/18 0900 08/12/18 1300   Blood Pressure (Admit)  102/60  120/60  124/50  120/70   Blood Pressure (Exercise)  118/64  120/64  148/68  128/62   Blood Pressure (Exit)  102/68  100/52  108/70  108/68   Heart Rate (Admit)  99 bpm  88 bpm  86 bpm  98 bpm   Heart Rate (Exercise)  114 bpm  114 bpm  111 bpm  121 bpm   Heart Rate (Exit)  98 bpm  98 bpm  99 bpm  102 bpm   Rating of Perceived  Exertion (Exercise)  13  12  14  12    Symptoms  no documentation  no documentation  no documentation  None   Comments  Pt first day of exercise.  no documentation  no documentation  no documentation   Duration  Progress to 30 minutes of  aerobic without signs/symptoms of physical distress  Progress to 30 minutes of  aerobic without signs/symptoms of physical distress  Progress to 30 minutes of  aerobic without signs/symptoms of physical distress  Progress to 30 minutes of  aerobic without signs/symptoms of physical distress   Intensity  THRR unchanged  THRR unchanged  THRR unchanged  THRR unchanged       Progression    Row Name 07/07/18 0900 07/21/18 1000 07/25/18 0900 08/12/18 1300   Progression  Continue to progress workloads to maintain intensity without signs/symptoms of physical distress.  Continue to progress workloads to maintain intensity without signs/symptoms  of physical distress.  Continue to progress workloads to maintain intensity without signs/symptoms of physical distress.  Continue to progress workloads to maintain intensity without signs/symptoms of physical distress.   Average METs  1.4  1.8  1.8  2       Resistance Training    Row Name 07/07/18 0900 07/21/18 1000 07/25/18 0900 08/12/18 1300   Training Prescription  no documentation  Yes  Yes  Yes   Weight  2 lbs  2 lbs  2 lbs  2 lbs   Reps  10-15  10-15  10-15  10-15   Time  10 Minutes  10 Minutes  10 Minutes  [place] Name 07/07/18 0900 07/21/18 1000 07/25/18 0900 08/12/18 1300   Level  2  2  2  3    SPM  75  75  75  75   Minutes  20  20  20  20    METs  1.3  1.8  1.8  1.8       Arm Ergometer    Row Name 07/07/18 0900 07/21/18 1000 07/25/18 0900 08/12/18 1300   Level  1  1  1  1    Watts  29  29  29  29    Minutes  10  10  10  10    METs  1.48  1.5  1.5  1.5       Track    Row Name 07/07/18 0900 07/21/18 1000 07/25/18 0900 08/12/18 1300   Laps  no documentation  6  no documentation  8    Minutes  no documentation  10  no documentation  10   METs  no documentation  2  no documentation  2.39       Home Exercise Plan    [place] Name 07/07/18 0900 07/21/18 1000 07/25/18 0900 08/12/18 1300   Plans to continue exercise at  no documentation  no documentation  Home (comment)  Home (comment)   Frequency  no documentation  no documentation  Add 2 additional days to program exercise sessions.  Add 2 additional days to program exercise sessions.   Initial Home Exercises Provided  no documentation  no documentation  07/25/18  07/25/18          Exercise Comments: Exercise Comments    Row Name 07/07/18 0954 07/23/18 1009 07/25/18 1005 08/19/18 1424   Exercise Comments  Pt first day of exercise. Tolerated exercise well.   Reviewed METs and goals with Pt. Pt is tolerating exercise well.   Reviewed HEP with Pt. Pt was responsive and understands goals.   Reviewed METs and goals with Pt. Pt is tolerating exercise well.       Exercise Goals and Review: Exercise Goals    Exercise Goals    Row Name 07/03/18 1023   Increase Physical Activity  Yes   Intervention  Provide advice, education, support and counseling about physical activity/exercise needs.;Develop an individualized exercise prescription for aerobic and resistive training based on initial evaluation findings, risk stratification, comorbidities and participant's personal goals.   Expected Outcomes  Short Term: Attend rehab on a regular basis to increase amount of physical activity.   Increase Strength and Stamina  Yes   Intervention  Provide advice, education, support and counseling about physical activity/exercise needs.;Develop an individualized exercise prescription for aerobic and resistive training based on initial evaluation findings, risk stratification, comorbidities and participant's personal goals.   Expected Outcomes  Short  Term: Increase workloads from initial exercise prescription for resistance, speed, and METs.   Able to  understand and use rate of perceived exertion (RPE) scale  Yes   Intervention  Provide education and explanation on how to use RPE scale   Expected Outcomes  Short Term: Able to use RPE daily in rehab to express subjective intensity level;Long Term:  Able to use RPE to guide intensity level when exercising independently   Knowledge and understanding of Target Heart Rate Range (THRR)  Yes   Intervention  Provide education and explanation of THRR including how the numbers were predicted and where they are located for reference   Expected Outcomes  Short Term: Able to state/look up THRR;Long Term: Able to use THRR to govern intensity when exercising independently;Short Term: Able to use daily as guideline for intensity in rehab   Able to check pulse independently  Yes   Intervention  Provide education and demonstration on how to check pulse in carotid and radial arteries.;Review the importance of being able to check your own pulse for safety during independent exercise   Expected Outcomes  Short Term: Able to explain why pulse checking is important during independent exercise;Long Term: Able to check pulse independently and accurately   Understanding of Exercise Prescription  Yes   Intervention  Provide education, explanation, and written materials on patient's individual exercise prescription   Expected Outcomes  Short Term: Able to explain program exercise prescription;Long Term: Able to explain home exercise prescription to exercise independently          Exercise Goals Re-Evaluation : Exercise Goals Re-Evaluation    Exercise Goal Re-Evaluation    Row Name 07/07/18 0953 07/23/18 1006 07/25/18 1003 08/19/18 1422   Exercise Goals Review  Increase Physical Activity;Increase Strength and Stamina;Able to understand and use rate of perceived exertion (RPE) scale;Knowledge and understanding of Target Heart Rate Range (THRR);Understanding of Exercise Prescription  Increase Physical Activity;Increase  Strength and Stamina;Able to understand and use rate of perceived exertion (RPE) scale;Knowledge and understanding of Target Heart Rate Range (THRR);Understanding of Exercise Prescription  Increase Physical Activity;Increase Strength and Stamina;Able to understand and use rate of perceived exertion (RPE) scale;Knowledge and understanding of Target Heart Rate Range (THRR);Understanding of Exercise Prescription  Increase Physical Activity;Increase Strength and Stamina;Able to understand and use rate of perceived exertion (RPE) scale;Knowledge and understanding of Target Heart Rate Range (THRR);Understanding of Exercise Prescription   Comments  Pt first day of exercise. Pt tolerated exercise Rx well.   Reviewed METs and goals with Pt. Pt has a MET level of 1.8. Tolerating exercise Rx well. Encouraged Pt to increase SPM on the stepper to 85. Pt is walking some at home in addition to cardiac rehab.   Reveiwed HEP with Pt. Pt was responsive and understand goals. Pt understand endpoints of exericse, weather precautions, THRR, and exercise Rx. Pt is walking at home for 30-45 minutes 2-3 days per week in addition to Cardiac Rehab.   Reviewed METs and goals with Pt. Pt MET level is 2.0. Pt is tolerating exercise well and increasing workloads gradually. Pt has a goal to increase strength enough to walk with just a cane instead of rollator. Will continue to work with Pt to reach his goals.    Expected Outcomes  Will continue to monitor and progress Pt as tolerated.   Will continue to monitor and progress Pt as tolerated.   Will continue to monitor and progress Pt as tolerated.   Will continue to monitor and progress Pt  as tolerated.           Discharge Exercise Prescription (Final Exercise Prescription Changes): Exercise Prescription Changes - 08/12/18 1300    Response to Exercise          Blood Pressure (Admit)  120/70    Blood Pressure (Exercise)  128/62    Blood Pressure (Exit)  108/68    Heart Rate (Admit)   98 bpm    Heart Rate (Exercise)  121 bpm    Heart Rate (Exit)  102 bpm    Rating of Perceived Exertion (Exercise)  12    Symptoms  None    Duration  Progress to 30 minutes of  aerobic without signs/symptoms of physical distress    Intensity  THRR unchanged        Progression          Progression  Continue to progress workloads to maintain intensity without signs/symptoms of physical distress.    Average METs  2        Resistance Training          Training Prescription  Yes    Weight  2 lbs    Reps  10-15    Time  10 Minutes        NuStep          Level  3    SPM  75    Minutes  20    METs  1.8        Arm Ergometer          Level  1    Watts  29    Minutes  10    METs  1.5        Track          Laps  8    Minutes  10    METs  2.39        Home Exercise Plan          Plans to continue exercise at  Home (comment)    Frequency  Add 2 additional days to program exercise sessions.    Initial Home Exercises Provided  07/25/18           Nutrition:  Target Goals: Understanding of nutrition guidelines, daily intake of sodium <158m, cholesterol <2063m calories 30% from fat and 7% or less from saturated fats, daily to have 5 or more servings of fruits and vegetables.  Biometrics: Pre Biometrics - 07/03/18 1022    Pre Biometrics          Height  5' 10"  (1.778 m)    Weight  86.7 kg    Waist Circumference  45 inches    Hip Circumference  43 inches    Waist to Hip Ratio  1.05 %    BMI (Calculated)  27.43    Triceps Skinfold  23 mm    % Body Fat  32.1 %    Grip Strength  16 kg    Flexibility  0 in    Single Leg Stand  0 seconds            Nutrition Therapy Plan and Nutrition Goals: Nutrition Therapy & Goals - 07/14/18 0915    Nutrition Therapy          Diet  heart healthy, carb modified        Personal Nutrition Goals          Nutrition Goal  pt able to name foods that affect blood glucose  Personal Goal #2  Pt to identify and include more  high iron foods to meals        Intervention Plan          Intervention  Prescribe, educate and counsel regarding individualized specific dietary modifications aiming towards targeted core components such as weight, hypertension, lipid management, diabetes, heart failure and other comorbidities.    Expected Outcomes  Short Term Goal: Understand basic principles of dietary content, such as calories, fat, sodium, cholesterol and nutrients.;Long Term Goal: Adherence to prescribed nutrition plan.           Nutrition Assessments: Nutrition Assessments - 07/03/18 1127    MEDFICTS Scores          Pre Score  18           Nutrition Goals Re-Evaluation: Nutrition Goals Re-Evaluation    Goals    Row Name 07/03/18 1125   Current Weight  191 lb 2.2 oz (86.7 kg)          Nutrition Goals Re-Evaluation: Nutrition Goals Re-Evaluation    Goals    Row Name 07/03/18 1125   Current Weight  191 lb 2.2 oz (86.7 kg)          Nutrition Goals Discharge (Final Nutrition Goals Re-Evaluation): Nutrition Goals Re-Evaluation - 07/03/18 1125    Goals          Current Weight  191 lb 2.2 oz (86.7 kg)           Psychosocial: Target Goals: Acknowledge presence or absence of significant depression and/or stress, maximize coping skills, provide positive support system. Participant is able to verbalize types and ability to use techniques and skills needed for reducing stress and depression.  Initial Review & Psychosocial Screening: Initial Psych Review & Screening - 07/03/18 1123    Initial Review          Current issues with  None Identified        Family Dynamics          Good Support System?  Yes   Rush Landmark has his [name] for support       Barriers          Psychosocial barriers to participate in program  There are no identifiable barriers or psychosocial needs.        Screening Interventions          Interventions  Encouraged to exercise           Quality of Life  Scores: Quality of Life - 07/03/18 1029    Quality of Life          Select  Quality of Life        Quality of Life Scores          Health/Function Pre  28.83 %    Socioeconomic Pre  30 %    Psych/Spiritual Pre  30 %    Family Pre  30 %    GLOBAL Pre  29.44 %          Scores of 19 and below usually indicate a poorer quality of life in these areas.  A difference of  2-3 points is a clinically meaningful difference.  A difference of 2-3 points in the total score of the Quality of Life Index has been associated with significant improvement in overall quality of life, self-image, physical symptoms, and general health in studies assessing change in quality of life.  PHQ-9: Recent Review Flowsheet Data    Depression screen  PHQ 2/9 07/07/2018   Decreased Interest 0   Down, Depressed, Hopeless 0   PHQ - 2 Score 0     Interpretation of Total Score  Total Score Depression Severity:  1-4 = Minimal depression, 5-9 = Mild depression, 10-14 = Moderate depression, 15-19 = Moderately severe depression, 20-27 = Severe depression   Psychosocial Evaluation and Intervention: Psychosocial Evaluation - 07/07/18 1013    Psychosocial Evaluation & Interventions          Interventions  Encouraged to exercise with the program and follow exercise prescription    Comments  no psychosocial needs identified, no interventions necessary. pt lives with his [name].  She is his caregiver. pt has 4 children, 9 grands and 6 great grands. pt enjoys watching [employer] football.     Expected Outcomes  pt will demonstrate positive outlook with good coping skills.     Continue Psychosocial Services   No Follow up required           Psychosocial Re-Evaluation: Psychosocial Re-Evaluation    Psychosocial Re-Evaluation    Row Name 07/23/18 1115 08/19/18 1341   Current issues with  None Identified  None Identified   Comments  no psychosocial needs identified, no interventions necessary   no psychosocial  needs identified, no interventions necessary    Expected Outcomes  pt will exhibit positive outlook with good coping skills.   pt will exhibit positive outlook with good coping skills.    Interventions  Encouraged to attend Cardiac Rehabilitation for the exercise  Encouraged to attend Cardiac Rehabilitation for the exercise   Continue Psychosocial Services   No Follow up required  No Follow up required          Psychosocial Discharge (Final Psychosocial Re-Evaluation): Psychosocial Re-Evaluation - 08/19/18 1341    Psychosocial Re-Evaluation          Current issues with  None Identified    Comments  no psychosocial needs identified, no interventions necessary     Expected Outcomes  pt will exhibit positive outlook with good coping skills.     Interventions  Encouraged to attend Cardiac Rehabilitation for the exercise    Continue Psychosocial Services   No Follow up required           Vocational Rehabilitation: Provide vocational rehab assistance to qualifying candidates.   Vocational Rehab Evaluation & Intervention: Vocational Rehab - 07/03/18 1128    Initial Vocational Rehab Evaluation & Intervention          Assessment shows need for Vocational Rehabilitation  No   Mr Bouillon is retired and does not need vocational rehab at this time          Education: Education Goals: Education classes will be provided on a weekly basis, covering required topics. Participant will state understanding/return demonstration of topics presented.  Learning Barriers/Preferences: Learning Barriers/Preferences - 07/03/18 1024    Learning Barriers/Preferences          Learning Barriers  Sight;Hearing    Learning Preferences  Audio;Verbal Instruction           Education Topics: Count Your Pulse:  -Group instruction provided by verbal instruction, demonstration, patient participation and written materials to support subject.  Instructors address importance of being able to find your pulse  and how to count your pulse when at home without a heart monitor.  Patients get hands on experience counting their pulse with staff help and individually. Flowsheet Row CARDIAC REHAB PHASE II EXERCISE from 08/13/2018 in [facility]  [place]  Date  07/11/18  Instruction Review Code  2- Demonstrated Understanding      Heart Attack, Angina, and Risk Factor Modification:  -Group instruction provided by verbal instruction, video, and written materials to support subject.  Instructors address signs and symptoms of angina and heart attacks.    Also discuss risk factors for heart disease and how to make changes to improve heart health risk factors.   Functional Fitness:  -Group instruction provided by verbal instruction, demonstration, patient participation, and written materials to support subject.  Instructors address safety measures for doing things around the house.  Discuss how to get up and down off the floor, how to pick things up properly, how to safely get out of a chair without assistance, and balance training.   Meditation and Mindfulness:  -Group instruction provided by verbal instruction, patient participation, and written materials to support subject.  Instructor addresses importance of mindfulness and meditation practice to help reduce stress and improve awareness.  Instructor also leads participants through a meditation exercise.    Stretching for Flexibility and Mobility:  -Group instruction provided by verbal instruction, patient participation, and written materials to support subject.  Instructors lead participants through series of stretches that are designed to increase flexibility thus improving mobility.  These stretches are additional exercise for major muscle groups that are typically performed during regular warm up and cool down.   Hands Only CPR:  -Group verbal, video, and participation provides a basic overview of AHA guidelines for community CPR.  Role-play of emergencies allow participants the opportunity to practice calling for help and chest compression technique with discussion of AED use.   Hypertension: -Group verbal and written instruction that provides a basic overview of hypertension including the most recent diagnostic guidelines, risk factor reduction with self-care instructions and medication management. Flowsheet Row CARDIAC REHAB PHASE II EXERCISE from 08/13/2018 in [place]  Date  07/25/18  Educator  RN  Instruction Review Code  2- Demonstrated Understanding       Nutrition I class: Heart Healthy Eating:  -Group instruction provided by PowerPoint slides, verbal discussion, and written materials to support subject matter. The instructor gives an explanation and review of the Therapeutic Lifestyle Changes diet recommendations, which includes a discussion on lipid goals, dietary fat, sodium, fiber, plant stanol/sterol esters, sugar, and the components of a well-balanced, healthy diet.   Nutrition II class: Lifestyle Skills:  -Group instruction provided by PowerPoint slides, verbal discussion, and written materials to support subject matter. The instructor gives an explanation and review of label reading, grocery shopping for heart health, heart healthy recipe modifications, and ways to make healthier choices when eating out.   Diabetes Question & Answer:  -Group instruction provided by PowerPoint slides, verbal discussion, and written materials to support subject matter. The instructor gives an explanation and review of diabetes co-morbidities, pre- and post-prandial blood glucose goals, pre-exercise blood glucose goals, signs, symptoms, and treatment of hypoglycemia and hyperglycemia, and foot care basics.   Diabetes Blitz:  -Group instruction provided by PowerPoint slides, verbal discussion, and written materials to support subject matter. The instructor gives an explanation and review  of the physiology behind type 1 and type 2 diabetes, diabetes medications and rational behind using different medications, pre- and post-prandial blood glucose recommendations and Hemoglobin A1c goals, diabetes diet, and exercise including blood glucose guidelines for exercising safely.    Portion Distortion:  -Group instruction provided by PowerPoint slides, verbal discussion, written materials, and  food models to support subject matter. The instructor gives an explanation of serving size versus portion size, changes in portions sizes over the last 20 years, and what consists of a serving from each food group.   Stress Management:  -Group instruction provided by verbal instruction, video, and written materials to support subject matter.  Instructors review role of stress in heart disease and how to cope with stress positively.   Flowsheet Row CARDIAC REHAB PHASE II EXERCISE from 08/13/2018 in [place]  Date  07/18/18  Instruction Review Code  2- Demonstrated Understanding      Exercising on Your Own:  -Group instruction provided by verbal instruction, power point, and written materials to support subject.  Instructors discuss benefits of exercise, components of exercise, frequency and intensity of exercise, and end points for exercise.  Also discuss use of nitroglycerin and activating EMS.  Review options of places to exercise outside of rehab.  Review guidelines for sex with heart disease. Flowsheet Row CARDIAC REHAB PHASE II EXERCISE from 08/13/2018 in [place]  Date  08/13/18  Educator  EP  Instruction Review Code  2- Demonstrated Understanding      Cardiac Drugs I:  -Group instruction provided by verbal instruction and written materials to support subject.  Instructor reviews cardiac drug classes: antiplatelets, anticoagulants, beta blockers, and statins.  Instructor discusses reasons, side effects, and lifestyle  considerations for each drug class.   Cardiac Drugs II:  -Group instruction provided by verbal instruction and written materials to support subject.  Instructor reviews cardiac drug classes: angiotensin converting enzyme inhibitors (ACE-I), angiotensin II receptor blockers (ARBs), nitrates, and calcium channel blockers.  Instructor discusses reasons, side effects, and lifestyle considerations for each drug class.   Anatomy and Physiology of the Circulatory System:  Group verbal and written instruction and models provide basic cardiac anatomy and physiology, with the coronary electrical and arterial systems. Review of: AMI, Angina, Valve disease, Heart Failure, Peripheral Artery Disease, Cardiac Arrhythmia, Pacemakers, and the ICD. Flowsheet Row CARDIAC REHAB PHASE II EXERCISE from 08/13/2018 in [place]  Date  07/23/18  Educator  RN  Instruction Review Code  2- Demonstrated Understanding      Other Education:  -Group or individual verbal, written, or video instructions that support the educational goals of the cardiac rehab program.   Holiday Eating Survival Tips:  -Group instruction provided by PowerPoint slides, verbal discussion, and written materials to support subject matter. The instructor gives patients tips, tricks, and techniques to help them not only survive but enjoy the holidays despite the onslaught of food that accompanies the holidays.   Knowledge Questionnaire Score: Knowledge Questionnaire Score - 07/03/18 1025    Knowledge Questionnaire Score          Pre Score  16/24           Core Components/Risk Factors/Patient Goals at Admission: Personal Goals and Risk Factors at Admission - 07/03/18 1025    Core Components/Risk Factors/Patient Goals on Admission           Weight Management  Yes;Obesity;Weight Maintenance;Weight Loss    Intervention  Weight Management: Develop a combined nutrition and exercise program designed to reach  desired caloric intake, while maintaining appropriate intake of nutrient and fiber, sodium and fats, and appropriate energy expenditure required for the weight goal.;Weight Management: Provide education and appropriate resources to help participant work on and attain dietary goals.;Weight Management/Obesity: Establish reasonable short term and long  term weight goals.;Obesity: Provide education and appropriate resources to help participant work on and attain dietary goals.    Admit Weight  191 lb 2.2 oz (86.7 kg)    Expected Outcomes  Short Term: Continue to assess and modify interventions until short term weight is achieved;Long Term: Adherence to nutrition and physical activity/exercise program aimed toward attainment of established weight goal;Weight Maintenance: Understanding of the daily nutrition guidelines, which includes 25-35% calories from fat, 7% or less cal from saturated fats, less than 260m cholesterol, less than 1.5gm of sodium, & 5 or more servings of fruits and vegetables daily;Weight Loss: Understanding of general recommendations for a balanced deficit meal plan, which promotes 1-2 lb weight loss per week and includes a negative energy balance of [phone] kcal/d;Understanding recommendations for meals to include 15-35% energy as protein, 25-35% energy from fat, 35-60% energy from carbohydrates, less than 2081mof dietary cholesterol, 20-35 gm of total fiber daily;Understanding of distribution of calorie intake throughout the day with the consumption of 4-5 meals/snacks    Diabetes  Yes    Intervention  Provide education about signs/symptoms and action to take for hypo/hyperglycemia.;Provide education about proper nutrition, including hydration, and aerobic/resistive exercise prescription along with prescribed medications to achieve blood glucose in normal ranges: Fasting glucose 65-99 mg/dL    Expected Outcomes  Short Term: Participant verbalizes understanding of the signs/symptoms and  immediate care of hyper/hypoglycemia, proper foot care and importance of medication, aerobic/resistive exercise and nutrition plan for blood glucose control.;Long Term: Attainment of HbA1C < 7%.    Hypertension  Yes    Intervention  Provide education on lifestyle modifcations including regular physical activity/exercise, weight management, moderate sodium restriction and increased consumption of fresh fruit, vegetables, and low fat dairy, alcohol moderation, and smoking cessation.;Monitor prescription use compliance.    Expected Outcomes  Short Term: Continued assessment and intervention until BP is < 140/9073mG in hypertensive participants. < 130/60m11m in hypertensive participants with diabetes, heart failure or chronic kidney disease.;Long Term: Maintenance of blood pressure at goal levels.    Lipids  Yes    Intervention  Provide education and support for participant on nutrition & aerobic/resistive exercise along with prescribed medications to achieve LDL <70mg66mL >40mg.70mExpected Outcomes  Short Term: Participant states understanding of desired cholesterol values and is compliant with medications prescribed. Participant is following exercise prescription and nutrition guidelines.;Long Term: Cholesterol controlled with medications as prescribed, with individualized exercise RX and with personalized nutrition plan. Value goals: LDL < 70mg, 21m> 40 mg.    Stress  Yes    Intervention  Offer individual and/or small group education and counseling on adjustment to heart disease, stress management and health-related lifestyle change. Teach and support self-help strategies.;Refer participants experiencing significant psychosocial distress to appropriate mental health specialists for further evaluation and treatment. When possible, include family members and significant others in education/counseling sessions.    Expected Outcomes  Short Term: Participant demonstrates changes in health-related behavior,  relaxation and other stress management skills, ability to obtain effective social support, and compliance with psychotropic medications if prescribed.;Long Term: Emotional wellbeing is indicated by absence of clinically significant psychosocial distress or social isolation.           Core Components/Risk Factors/Patient Goals Review:  Goals and Risk Factor Review    Core Components/Risk Factors/Patient Goals Review    Row Name 07/07/18 1015 07/23/18 1116 08/19/18 1341   Personal Goals Review  Weight Management/Obesity;Diabetes;Hypertension;Lipids;Stress  Weight Management/Obesity;Diabetes;Hypertension;Lipids;Stress  Weight Management/Obesity;Diabetes;Hypertension;Lipids;Stress  Review  pt with multiple CAD RF demonstrates willingness to participate in CR program. pt personal goals are to increase strength/stamina, funcational ability and increased ease with daily activities.    pt with multiple CAD RF demonstrates willingness to participate in CR program. pt personal goals are to increase strength/stamina, funcational ability and increased ease with daily activities. pt is enjoying playing cards at [facility].   pt with multiple CAD RF demonstrates willingness to participate in CR program. pt personal goals are to increase strength/stamina, funcational ability and increased ease with daily activities. pt is enjoying playing cards at [facility] [place].    Expected Outcomes  pt will participate in CR exercise, nutrition and  lifestyle modification to decrease overall RF.   pt will participate in CR exercise, nutrition and  lifestyle modification to decrease overall RF.   pt will participate in CR exercise, nutrition and  lifestyle modification to decrease overall RF.           Core Components/Risk Factors/Patient Goals at Discharge (Final Review):  Goals and Risk Factor Review - 08/19/18 1341    Core Components/Risk Factors/Patient Goals Review          Personal Goals Review  Weight  Management/Obesity;Diabetes;Hypertension;Lipids;Stress    Review  pt with multiple CAD RF demonstrates willingness to participate in CR program. pt personal goals are to increase strength/stamina, funcational ability and increased ease with daily activities. pt is enjoying playing cards at [facility] [employer] [facility] [employer]     Expected Outcomes  pt will participate in CR exercise, nutrition and  lifestyle modification to decrease overall RF.            ITP Comments: ITP Comments    Row Name 07/03/18 1019 07/07/18 1008 07/23/18 1111 08/19/18 1341   ITP Comments  Dr. Fransico Him, Medical Director  pt started group exercise. pt tolerated light activity without difficulty. pt oriented to exercise equipment and safety routine. pt home rollator not adequate size for pt height, unable to adjust for taller setting. pt daughter, [name], has made request to pt PCP for rollator order.  She plans to purchase at local equipment supplier. understanding verbalized.    30 day ITP review.  pt with good attendance and participation.    30 day ITP review.  pt with good attendance and participation.        Comments:

## 2018-08-22 ENCOUNTER — Encounter (HOSPITAL_COMMUNITY)
Admission: RE | Admit: 2018-08-22 | Discharge: 2018-08-22 | Disposition: A | Discharge status: Home / Self Care | Payer: Medicare PPO | Source: Ambulatory Visit | Attending: Cardiology | Admitting: Cardiology

## 2018-08-22 ENCOUNTER — Ambulatory Visit (HOSPITAL_COMMUNITY): Payer: Medicare PPO

## 2018-08-22 DIAGNOSIS — I2111 ST elevation (STEMI) myocardial infarction involving right coronary artery: Secondary | ICD-10-CM | POA: Diagnosis not present

## 2018-08-22 DIAGNOSIS — Z955 Presence of coronary angioplasty implant and graft: Secondary | ICD-10-CM

## 2018-08-25 ENCOUNTER — Telehealth (HOSPITAL_COMMUNITY): Payer: Self-pay | Admitting: Family Medicine

## 2018-08-25 ENCOUNTER — Ambulatory Visit: Payer: Medicare PPO | Admitting: Endocrinology

## 2018-08-25 ENCOUNTER — Encounter (HOSPITAL_COMMUNITY): Payer: Medicare PPO

## 2018-08-25 ENCOUNTER — Encounter: Payer: Self-pay | Admitting: Endocrinology

## 2018-08-25 ENCOUNTER — Other Ambulatory Visit: Payer: Self-pay

## 2018-08-25 ENCOUNTER — Ambulatory Visit (HOSPITAL_COMMUNITY): Payer: Medicare PPO

## 2018-08-25 VITALS — BP 120/76 | HR 99 | Ht 70.0 in | Wt 190.4 lb

## 2018-08-25 DIAGNOSIS — E1165 Type 2 diabetes mellitus with hyperglycemia: Secondary | ICD-10-CM | POA: Diagnosis not present

## 2018-08-25 DIAGNOSIS — Z794 Long term (current) use of insulin: Secondary | ICD-10-CM | POA: Diagnosis not present

## 2018-08-25 NOTE — Progress Notes (Signed)
Patient ID: Clayton [name], male   DOB: [birth year], 85 y.o.   MRN: [id number]   Reason for Appointment: Followup of diabetes and various issues  History of Present Illness   Type 2 DIABETES MELITUS diagnosed 1989  He has had long-standing diabetes and has been on basal bolus insulin for the last few years Since 2014 he has required larger doses of insulin especially with having to take periodic courses of steroids for his pulmonary problems Also his Actos was stopped because of tendency to edema and metformin stopped because of renal dysfunction  Insulin regimen: Novolog 7-6-8 units before meals, Lantus 26 units in a.m., using syringes  A1c is stable at 6.8  Current blood sugar patterns and problems identified:  His blood sugars have been higher after getting prednisone earlier this year and not well controlled even with increasing insulin at that time  However currently not able to assess his blood sugar patterns since blood sugar monitor does not have the right date and time programmed and he has 3 different glucose monitors  He thinks his blood sugars are generally fairly good with only rare high blood sugars when he is getting more carbohydrate or sweets  His daughter is concerned that he is eating a sandwich and 2 fruits at lunchtime together but patient does not report high readings in the afternoon  Recently because of intercurrent illnesses and weakness he has not been exercising  He was supposed to document glucose monitoring 4 times a day to enable him to qualify for the freestyle libre but unable to get the data today and also he apparently does not check his sugars much for dinner at night  His weight is about the same  No hypoglycemia reported  He thinks his fasting blood sugars are usually under 150  Hypoglycemia:  recently none   Oral hypoglycemic drugs: None           Monitors blood glucose:  3-4 times a day   Glucometer: Accu-Chek        Glucose   monitor download as above  Meals: 3 meals per day.  breakfast: oatmeal / grits, with peanut butter, for lunch he will have a sandwich with apple and grapefruit   Dinner usually at 6:30 pm        Physical activity: exercise: None now, was doing exercise bike 1-2/7 days at the [facility]  Weight history:  Wt Readings from Last 3 Encounters:  08/25/18 190 lb 6.4 oz (86.4 kg)  08/08/18 190 lb (86.2 kg)  07/10/18 194 lb 2 oz (88.1 kg)               Complications are: Nephropathy. Microalbumin level now consistently normal  LABS:  Lab Results  Component Value Date   HGBA1C 6.8 (H) 08/07/2018   HGBA1C 6.6 (H) 05/09/2018   HGBA1C 6.3 (H) 04/21/2018   Lab Results  Component Value Date   MICROALBUR 5.4 (H) 08/07/2018   LDLCALC 56 05/09/2018   CREATININE 2.50 (H) 08/07/2018   Problem 2:  RENAL insufficiency: He has had chronic increase in creatinine, was 1.7 in 2014 with slow progression . This has been related to nephropathy and glomerulosclerosis ACE inhibitor/ARB recommended by nephrologist but not done because of the history of patient getting hyperkalemia with these drugs in the past Renal ultrasound did not show any abnormality  Creatinine has fluctuated more especially since his hospitalization  He has been recommended going back to nephrologist  by his PCP yesterday and does not know when his appointment is  Lab Results  Component Value Date   CREATININE 2.50 (H) 08/07/2018   CREATININE 2.44 (H) 06/13/2018   CREATININE 2.57 (H) 05/09/2018   CREATININE 2.05 (H) 04/26/2018     He has had a normal PTH, not on calcitriol and only on vitamin D   Lab Results  Component Value Date   PTH 54 07/04/2017   CALCIUM 9.2 08/07/2018   CAION 1.18 04/20/2018   PHOS 2.3 04/30/2014      Allergies as of 08/25/2018      Reactions   Doxycycline Rash   Levaquin [levofloxacin In D5w]    Caused chest pain and heartburn   Nitrous Oxide Other (See Comments)   Reaction:  Unknown     Penicillins Other (See Comments)   Reaction:  Unknown Has patient had a PCN reaction causing immediate rash, facial/tongue/throat swelling, SOB or lightheadedness with hypotension: Unsure Has patient had a PCN reaction causing severe rash involving mucus membranes or skin necrosis: Unsure Has patient had a PCN reaction that required hospitalization Unsure  Has patient had a PCN reaction occurring within the last 10 years: No If all of the above answers are "NO", then may proceed with Cephalosporin use.   Tape Other (See Comments)   Reaction:  Tears pts skin    Diltiazem Rash      Medication List       Accurate as of August 25, 2018 11:59 PM. Always use your most recent med list.        Accu-Chek Softclix Lancets lancets TEST BLOOD SUGAR FOUR TIMES DAILY   albuterol 108 (90 Base) MCG/ACT inhaler Commonly known as:  PROVENTIL HFA;VENTOLIN HFA Inhale 1-2 puffs into the lungs every 6 (six) hours as needed for wheezing or shortness of breath.   atorvastatin 80 MG tablet Commonly known as:  LIPITOR Take 0.5 tablets (40 mg total) by mouth daily.   B-D SINGLE USE SWABS REGULAR Pads USE 7 SWABS DAILY AS DIRECTED   benzonatate 100 MG capsule Commonly known as:  TESSALON Take by mouth 3 (three) times daily as needed for cough.   budesonide-formoterol 160-4.5 MCG/ACT inhaler Commonly known as:  Symbicort Inhale 2 puffs into the lungs 2 (two) times daily.   cholecalciferol 1000 units tablet Commonly known as:  VITAMIN D Take 2,000 Units by mouth daily.   Droplet Insulin Syringe 31G X 5/16" 0.3 ML Misc Generic drug:  Insulin Syringe-Needle U-100 USE THREE TIMES DAILY   finasteride 5 MG tablet Commonly known as:  PROSCAR TAKE 1 TABLET (5 MG TOTAL) BY MOUTH DAILY.   furosemide 20 MG tablet Commonly known as:  LASIX Take 1 tablet (20 mg total) by mouth daily. Take 2 tabs 5 times weekly.   glucose blood test strip Use as instructed to test blood sugars 4 times daily   insulin  aspart 100 UNIT/ML injection Commonly known as:  novoLOG TAKE 8 UNITS UNDER THE SKIN DAILY Monday-Friday AND ON Saturday AND Sunday TAKE 6 UNITS AT BREAKFAST AND LUNCH, AND 8 UNITS AT DINNER.   insulin glargine 100 UNIT/ML injection Commonly known as:  LANTUS Inject 26 Units into the skin daily. INJECT 26 UNITS UNDER THE SKIN EVERY MORNING.   isosorbide mononitrate 30 MG 24 hr tablet Commonly known as:  IMDUR Take 1 tablet (30 mg total) by mouth daily.   levofloxacin 500 MG tablet Commonly known as:  LEVAQUIN Take 500 mg by mouth daily.   Melatonin  5 MG Tabs Take 2 tablets by mouth at bedtime.   multivitamin with minerals Tabs tablet Take 1 tablet by mouth daily.   omeprazole 20 MG capsule Commonly known as:  PRILOSEC Take 1 capsule (20 mg total) by mouth daily.   ticagrelor 90 MG Tabs tablet Commonly known as:  BRILINTA Take 1 tablet (90 mg total) by mouth 2 (two) times daily.   Toviaz 4 MG Tb24 tablet Generic drug:  fesoterodine Take 4 mg by mouth daily.       Allergies:  Allergies  Allergen Reactions  . Doxycycline Rash  . Levaquin [Levofloxacin In D5w]     Caused chest pain and heartburn  . Nitrous Oxide Other (See Comments)    Reaction:  Unknown   . Penicillins Other (See Comments)    Reaction:  Unknown Has patient had a PCN reaction causing immediate rash, facial/tongue/throat swelling, SOB or lightheadedness with hypotension: Unsure Has patient had a PCN reaction causing severe rash involving mucus membranes or skin necrosis: Unsure Has patient had a PCN reaction that required hospitalization Unsure  Has patient had a PCN reaction occurring within the last 10 years: No If all of the above answers are "NO", then may proceed with Cephalosporin use.  . Tape Other (See Comments)    Reaction:  Tears pts skin   . Diltiazem Rash    Past Medical History:  Diagnosis Date  . Anemia   . BPH (benign prostatic hyperplasia)   . CAD (coronary artery disease)     hx of stemi  04-2018   . Chronic renal failure   . Chronic renal insufficiency   . COPD (chronic obstructive pulmonary disease) ([place])   . DDD (degenerative disc disease), lumbar   . Diabetes mellitus   . Hyperlipidemia   . Hyperparathyroidism ([place])   . Hypertension   . IDA (iron deficiency anemia)   . Neuromuscular disorder ([place])   . OSA on CPAP   . Osteomyelitis of forearm ([place])   . Osteoporosis     Past Surgical History:  Procedure Laterality Date  . CORONARY STENT INTERVENTION N/A 04/20/2018   Procedure: CORONARY STENT INTERVENTION;  Surgeon: Leonie Man, MD;  Location: [facility] INVASIVE CV LAB;;;    . [facility] ACUTE MI REVASCULARIZATION N/A 04/20/2018   Procedure: Coronary/Graft Acute MI Revascularization;  Surgeon: Leonie Man, MD;  Location: [place] CV LAB;  Service: Cardiovascular;  Laterality: N/A; --> after initial PTCA restoring flow down the RCA, there was diffuse proximal to mid disease treated with a DES SYNERGY 3 X 38 ( 3.63mm)  . EYE SURGERY    . HOLTER MONITOR  05/2018   Sinus rhythm noted with sinus bradycardia.  Also sinus rhythm with 2-1 AV block noted.  Maximum heart rate was sinus tachycardia 123 bpm.  Rare PVCs accelerated idioventricular rhythm noted.  Intermittent Wenckebach block noted.  . I&D EXTREMITY Right 08/13/2016   Procedure: IRRIGATION AND DEBRIDEMENT RIGHT ELBOW AND HAND;  Surgeon: Milly Jakob, MD;  Location: [facility];  Service: Orthopedics;  Laterality: Right;  . LEFT HEART CATH AND CORONARY ANGIOGRAPHY N/A 04/20/2018   Procedure: LEFT HEART CATH AND CORONARY ANGIOGRAPHY;  Surgeon: Leonie Man, MD;  Location: [place] CV LAB; mRCA 100%&80% (after 55% tapering in pRCA), mLM-ostLAD 40% w/ 85% ostCx, ost-pCx 65% @ Om1 w/ ost 85%. pLAD 60% & 50% after D1 with distal 75%, ostD1 65% & 90% after small side branch.    . TRANSTHORACIC ECHOCARDIOGRAM  04/21/2018   EF 50-55%.  Posterior Wall HK. Severe RV HK, dilated IVC. Temp wire in place  .  VASECTOMY    . VIDEO BRONCHOSCOPY Bilateral 08/25/2012   Procedure: VIDEO BRONCHOSCOPY WITHOUT FLUORO;  Surgeon: Brand Males, MD;  Location: [place];  Service: Cardiopulmonary;  Laterality: Bilateral;    Family History  Problem Relation Age of Onset  . Diabetes Mellitus II Other   . Hypertension Other   . Other Father        complications from strep  . CVA Father   . Cancer Sister   . Other Brother        hip replacement complications     Social History:  reports that he quit smoking about 45 years ago. His smoking use included cigarettes. He has a 99.00 pack-year smoking history. He quit smokeless tobacco use about 42 years ago. He reports that he does not drink alcohol or use drugs.  Review of Systems  BLOOD PRESSURE:  He is not on any treatment for this and blood pressure is fairly normal    BP Readings from Last 3 Encounters:  08/25/18 120/76  08/08/18 (!) 116/52  07/16/18 (!) 112/56    HYPERKALEMIA, history of:  No recurrence,  watching his diet for high potassium foods   Lab Results  Component Value Date   K 4.0 08/07/2018      EDEMA: Currently being monitored by nephrologist  ANEMIA:   He has a  history of anemia related to renal dysfunction and mild iron deficiency   He is now getting iron infusions   Lab Results  Component Value Date   WBC 12.7 (H) 08/07/2018   HGB 9.7 (L) 08/07/2018   HCT 29.5 (L) 08/07/2018   MCV 96.1 08/07/2018   PLT 413.0 (H) 08/07/2018   Lab Results  Component Value Date   IRON 21 (L) 04/23/2018   TIBC 230 (L) 04/23/2018   FERRITIN 66.2 08/07/2018    He has been Using CPAP and this is being adjusted by pulmonologist.    He has had chronic lower urinary tract problems and BPH.  Prescriptions are being done by his urologist  HYPERLIPIDEMIA:     He is on atorvastatin since his MI prescribed by cardiologist   Lab Results  Component Value Date   CHOL 108 05/09/2018   HDL 35.30 (L) 05/09/2018   LDLCALC 56  05/09/2018   TRIG 83.0 05/09/2018   CHOLHDL 3 05/09/2018   Lab Results  Component Value Date   ALT 21 08/07/2018        Examination:   BP 120/76 (BP Location: Left Arm, Patient Position: Sitting, Cuff Size: Normal)   Pulse 99   Ht 5\' 10"  (1.778 m)   Wt 190 lb 6.4 oz (86.4 kg)   SpO2 96%   BMI 27.32 kg/m   Body mass index is 27.32 kg/m.      Assesment/PLAN:   1. Diabetes type 2 with obesity treated with basal bolus insulin  See history of present illness for detailed discussion of current diabetes management, blood sugar patterns and problems identifie  Considering his age and duration of diabetes his A1c is still excellent at 6.8  Today unable to analyze his blood sugar patterns at home because of his not having the right date or time on his meters had 3 different Accu-Chek meter and sent home Fasting readings are still fairly good with taking Lantus once a day in the morning He will use only 1 glucose meter that was labeled in the office and  programmed to the right and date and time If he has enough readings to qualify for the freestyle libre and will proceed with this  2.   Chronic kidney disease: His creatinine is relatively stable recently He is going to be followed by nephrologist     There are no Patient Instructions on file for this visit.      Elayne Snare  08/26/2018, 7:58 AM

## 2018-08-27 ENCOUNTER — Encounter (HOSPITAL_COMMUNITY)
Admission: RE | Admit: 2018-08-27 | Discharge: 2018-08-27 | Disposition: A | Discharge status: Home / Self Care | Payer: Medicare PPO | Source: Ambulatory Visit | Attending: Cardiology | Admitting: Cardiology

## 2018-08-27 ENCOUNTER — Other Ambulatory Visit: Payer: Self-pay

## 2018-08-27 ENCOUNTER — Ambulatory Visit (HOSPITAL_COMMUNITY): Payer: Medicare PPO

## 2018-08-27 DIAGNOSIS — Z955 Presence of coronary angioplasty implant and graft: Secondary | ICD-10-CM | POA: Diagnosis not present

## 2018-08-27 DIAGNOSIS — I2111 ST elevation (STEMI) myocardial infarction involving right coronary artery: Secondary | ICD-10-CM | POA: Diagnosis not present

## 2018-08-29 ENCOUNTER — Ambulatory Visit (HOSPITAL_COMMUNITY): Payer: Medicare PPO

## 2018-08-29 ENCOUNTER — Encounter (HOSPITAL_COMMUNITY): Payer: Medicare PPO

## 2018-08-29 ENCOUNTER — Telehealth (HOSPITAL_COMMUNITY): Payer: Self-pay | Admitting: Family Medicine

## 2018-09-01 ENCOUNTER — Ambulatory Visit (HOSPITAL_COMMUNITY): Payer: Medicare PPO

## 2018-09-01 ENCOUNTER — Encounter (HOSPITAL_COMMUNITY): Payer: Medicare PPO

## 2018-09-03 ENCOUNTER — Encounter (HOSPITAL_COMMUNITY): Payer: Medicare PPO

## 2018-09-03 ENCOUNTER — Ambulatory Visit (HOSPITAL_COMMUNITY): Payer: Medicare PPO

## 2018-09-04 ENCOUNTER — Other Ambulatory Visit: Payer: Self-pay | Admitting: Endocrinology

## 2018-09-05 ENCOUNTER — Ambulatory Visit (HOSPITAL_COMMUNITY): Payer: Medicare PPO

## 2018-09-05 ENCOUNTER — Encounter (HOSPITAL_COMMUNITY): Payer: Medicare PPO

## 2018-09-05 NOTE — Progress Notes (Signed)
Discharge Progress Report  Patient Details  Name: Clayton [name] MRN: [id number] Date of Birth: 14-Dec-1927 Referring Provider:   Flowsheet Row CARDIAC REHAB PHASE II ORIENTATION from 07/03/2018 in [facility]  Referring Provider  Dr. Ellyn Hack       Number of Visits: 20     Reason for Discharge:  Early Exit:  Personal; self quarantine for [phone] virus precaution  Smoking History:  Social History   Tobacco Use  Smoking Status Former Smoker  . Packs/day: 3.00  . Years: 33.00  . Pack years: 99.00  . Types: Cigarettes  . Last attempt to quit: 06/19/1973  . Years since quitting: 45.2  Smokeless Tobacco Former Systems developer  . Quit date: 09/26/1975    Diagnosis:  Status post coronary artery stent placement, s/p DES RCA 04/20/18  ST elevation myocardial infarction involving right coronary artery ([place]), 04/20/18  ADL [facility]:   Initial Exercise Prescription: Initial Exercise Prescription - 07/03/18 1000    Date of Initial Exercise RX and Referring Provider          Date  07/03/18    Referring Provider  Dr. Ellyn Hack    Expected Discharge Date  10/10/18        NuStep          Level  2    SPM  75    Minutes  20    METs  0.8        Arm Ergometer          Level  1    Watts  8    Minutes  10    METs  1.6        Prescription Details          Frequency (times per week)  3    Duration  Progress to 30 minutes of continuous aerobic without signs/symptoms of physical distress        Intensity          THRR 40-80% of Max Heartrate  53-106    Ratings of Perceived Exertion  11-13        Progression          Progression  Continue to progress workloads to maintain intensity without signs/symptoms of physical distress.        Resistance Training          Training Prescription  Yes    Weight  2 lbs    Reps  10-15           Discharge Exercise Prescription (Final Exercise Prescription Changes): Exercise Prescription Changes - 08/27/18 0939     Response to Exercise          Blood Pressure (Admit)  108/54    Blood Pressure (Exercise)  132/60    Blood Pressure (Exit)  112/64    Heart Rate (Admit)  99 bpm    Heart Rate (Exercise)  120 bpm    Heart Rate (Exit)  98 bpm    Rating of Perceived Exertion (Exercise)  12    Symptoms  None    Comments  Pt last day of exercise.     Duration  Progress to 30 minutes of  aerobic without signs/symptoms of physical distress    Intensity  THRR unchanged        Progression          Progression  Continue to progress workloads to maintain intensity without signs/symptoms of physical distress.    Average METs  2  Resistance Training          Training Prescription  No    Weight  --    Reps  --    Time  --        NuStep          Level  3    SPM  75    Minutes  20    METs  1.8        Arm Ergometer          Level  1    Watts  29    Minutes  10    METs  1.5        Track          Laps  8    Minutes  10    METs  2.39        Home Exercise Plan          Plans to continue exercise at  Home (comment)    Frequency  Add 2 additional days to program exercise sessions.    Initial Home Exercises Provided  07/25/18           Functional Capacity: 6 Minute Walk    6 Minute Walk    Row Name 07/03/18 1021   Phase  Initial   Distance  600 feet   Walk Time  6 minutes   # of Rest Breaks  1   MPH  1.1   METS  0.8   RPE  13   VO2 Peak  2.84   Symptoms  Yes (comment)   Comments  Fatigue   Resting HR  98 bpm   Resting BP  120/70   Resting Oxygen Saturation   97 %   Exercise Oxygen Saturation  during 6 min walk  97 %   Max Ex. HR  112 bpm   Max Ex. BP  124/74   2 Minute Post BP  108/56          Psychological, QOL, Others - Outcomes: PHQ 2/9: Depression screen PHQ 2/9 07/07/2018  Decreased Interest 0  Down, Depressed, Hopeless 0  PHQ - 2 Score 0  Some recent data might be hidden    Quality of Life: Quality of Life - 07/03/18 1029    Quality of Life           Select  Quality of Life        Quality of Life Scores          Health/Function Pre  28.83 %    Socioeconomic Pre  30 %    Psych/Spiritual Pre  30 %    Family Pre  30 %    GLOBAL Pre  29.44 %           Personal Goals: Goals established at orientation with interventions provided to work toward goal. Personal Goals and Risk Factors at Admission - 07/03/18 1025    Core Components/Risk Factors/Patient Goals on Admission           Weight Management  Yes;Obesity;Weight Maintenance;Weight Loss    Intervention  Weight Management: Develop a combined nutrition and exercise program designed to reach desired caloric intake, while maintaining appropriate intake of nutrient and fiber, sodium and fats, and appropriate energy expenditure required for the weight goal.;Weight Management: Provide education and appropriate resources to help participant work on and attain dietary goals.;Weight Management/Obesity: Establish reasonable short term and long term weight goals.;Obesity: Provide education and appropriate resources to help participant work  on and attain dietary goals.    Admit Weight  191 lb 2.2 oz (86.7 kg)    Expected Outcomes  Short Term: Continue to assess and modify interventions until short term weight is achieved;Long Term: Adherence to nutrition and physical activity/exercise program aimed toward attainment of established weight goal;Weight Maintenance: Understanding of the daily nutrition guidelines, which includes 25-35% calories from fat, 7% or less cal from saturated fats, less than 257m cholesterol, less than 1.5gm of sodium, & 5 or more servings of fruits and vegetables daily;Weight Loss: Understanding of general recommendations for a balanced deficit meal plan, which promotes 1-2 lb weight loss per week and includes a negative energy balance of [phone] kcal/d;Understanding recommendations for meals to include 15-35% energy as protein, 25-35% energy from fat, 35-60% energy from  carbohydrates, less than 2071mof dietary cholesterol, 20-35 gm of total fiber daily;Understanding of distribution of calorie intake throughout the day with the consumption of 4-5 meals/snacks    Diabetes  Yes    Intervention  Provide education about signs/symptoms and action to take for hypo/hyperglycemia.;Provide education about proper nutrition, including hydration, and aerobic/resistive exercise prescription along with prescribed medications to achieve blood glucose in normal ranges: Fasting glucose 65-99 mg/dL    Expected Outcomes  Short Term: Participant verbalizes understanding of the signs/symptoms and immediate care of hyper/hypoglycemia, proper foot care and importance of medication, aerobic/resistive exercise and nutrition plan for blood glucose control.;Long Term: Attainment of HbA1C < 7%.    Hypertension  Yes    Intervention  Provide education on lifestyle modifcations including regular physical activity/exercise, weight management, moderate sodium restriction and increased consumption of fresh fruit, vegetables, and low fat dairy, alcohol moderation, and smoking cessation.;Monitor prescription use compliance.    Expected Outcomes  Short Term: Continued assessment and intervention until BP is < 140/9072mG in hypertensive participants. < 130/48m82m in hypertensive participants with diabetes, heart failure or chronic kidney disease.;Long Term: Maintenance of blood pressure at goal levels.    Lipids  Yes    Intervention  Provide education and support for participant on nutrition & aerobic/resistive exercise along with prescribed medications to achieve LDL <70mg55mL >40mg.90mExpected Outcomes  Short Term: Participant states understanding of desired cholesterol values and is compliant with medications prescribed. Participant is following exercise prescription and nutrition guidelines.;Long Term: Cholesterol controlled with medications as prescribed, with individualized exercise RX and with  personalized nutrition plan. Value goals: LDL < 70mg, 86m> 40 mg.    Stress  Yes    Intervention  Offer individual and/or small group education and counseling on adjustment to heart disease, stress management and health-related lifestyle change. Teach and support self-help strategies.;Refer participants experiencing significant psychosocial distress to appropriate mental health specialists for further evaluation and treatment. When possible, include family members and significant others in education/counseling sessions.    Expected Outcomes  Short Term: Participant demonstrates changes in health-related behavior, relaxation and other stress management skills, ability to obtain effective social support, and compliance with psychotropic medications if prescribed.;Long Term: Emotional wellbeing is indicated by absence of clinically significant psychosocial distress or social isolation.            Personal Goals Discharge: Goals and Risk Factor Review    Core Components/Risk Factors/Patient Goals Review    Row Name 07/07/18 1015 07/23/18 1116 08/19/18 1341 09/05/18 1532   Personal Goals Review  Weight Management/Obesity;Diabetes;Hypertension;Lipids;Stress  Weight Management/Obesity;Diabetes;Hypertension;Lipids;Stress  Weight Management/Obesity;Diabetes;Hypertension;Lipids;Stress  Weight Management/Obesity;Diabetes;Hypertension;Lipids;Stress   Review  pt with multiple CAD RF demonstrates  willingness to participate in CR program. pt personal goals are to increase strength/stamina, funcational ability and increased ease with daily activities.    pt with multiple CAD RF demonstrates willingness to participate in CR program. pt personal goals are to increase strength/stamina, funcational ability and increased ease with daily activities. pt is enjoying playing cards at [facility].   pt with multiple CAD RF demonstrates willingness to participate in CR program. pt personal goals are to increase strength/stamina,  funcational ability and increased ease with daily activities. pt is enjoying playing cards at [facility].   pt with multiple CAD RF demonstrates willingness to participate in CR program. pt personal goals are to increase strength/stamina, funcational ability and increased ease with daily activities. pt is enjoying playing cards at [facility].    Expected Outcomes  pt will participate in CR exercise, nutrition and  lifestyle modification to decrease overall RF.   pt will participate in CR exercise, nutrition and  lifestyle modification to decrease overall RF.   pt will participate in CR exercise, nutrition and  lifestyle modification to decrease overall RF.   pt will participate in CR exercise, nutrition and  lifestyle modification to decrease overall RF.           Exercise Goals and Review: Exercise Goals    Exercise Goals    Row Name 07/03/18 1023   Increase Physical Activity  Yes   Intervention  Provide advice, education, support and counseling about physical activity/exercise needs.;Develop an individualized exercise prescription for aerobic and resistive training based on initial evaluation findings, risk stratification, comorbidities and participant's personal goals.   Expected Outcomes  Short Term: Attend rehab on a regular basis to increase amount of physical activity.   Increase Strength and Stamina  Yes   Intervention  Provide advice, education, support and counseling about physical activity/exercise needs.;Develop an individualized exercise prescription for aerobic and resistive training based on initial evaluation findings, risk stratification, comorbidities and participant's personal goals.   Expected Outcomes  Short Term: Increase workloads from initial exercise prescription for resistance, speed, and METs.   Able to understand and use rate of perceived exertion (RPE) scale  Yes   Intervention  Provide education and explanation on how to use RPE scale   Expected Outcomes  Short Term:  Able to use RPE daily in rehab to express subjective intensity level;Long Term:  Able to use RPE to guide intensity level when exercising independently   Knowledge and understanding of Target Heart Rate Range (THRR)  Yes   Intervention  Provide education and explanation of THRR including how the numbers were predicted and where they are located for reference   Expected Outcomes  Short Term: Able to state/look up THRR;Long Term: Able to use THRR to govern intensity when exercising independently;Short Term: Able to use daily as guideline for intensity in rehab   Able to check pulse independently  Yes   Intervention  Provide education and demonstration on how to check pulse in carotid and radial arteries.;Review the importance of being able to check your own pulse for safety during independent exercise   Expected Outcomes  Short Term: Able to explain why pulse checking is important during independent exercise;Long Term: Able to check pulse independently and accurately   Understanding of Exercise Prescription  Yes   Intervention  Provide education, explanation, and written materials on patient's individual exercise prescription   Expected Outcomes  Short Term: Able to explain program exercise prescription;Long Term: Able to explain home exercise prescription to  exercise independently          Exercise Goals Re-Evaluation: Exercise Goals Re-Evaluation    Exercise Goal Re-Evaluation    Row Name 07/07/18 0953 07/23/18 1006 07/25/18 1003 08/19/18 1422 08/29/18 0940   Exercise Goals Review  Increase Physical Activity;Increase Strength and Stamina;Able to understand and use rate of perceived exertion (RPE) scale;Knowledge and understanding of Target Heart Rate Range (THRR);Understanding of Exercise Prescription  Increase Physical Activity;Increase Strength and Stamina;Able to understand and use rate of perceived exertion (RPE) scale;Knowledge and understanding of Target Heart Rate Range (THRR);Understanding  of Exercise Prescription  Increase Physical Activity;Increase Strength and Stamina;Able to understand and use rate of perceived exertion (RPE) scale;Knowledge and understanding of Target Heart Rate Range (THRR);Understanding of Exercise Prescription  Increase Physical Activity;Increase Strength and Stamina;Able to understand and use rate of perceived exertion (RPE) scale;Knowledge and understanding of Target Heart Rate Range (THRR);Understanding of Exercise Prescription  Increase Physical Activity;Increase Strength and Stamina;Able to understand and use rate of perceived exertion (RPE) scale;Knowledge and understanding of Target Heart Rate Range (THRR);Understanding of Exercise Prescription   Comments  Pt first day of exercise. Pt tolerated exercise Rx well.   Reviewed METs and goals with Pt. Pt has a MET level of 1.8. Tolerating exercise Rx well. Encouraged Pt to increase SPM on the stepper to 85. Pt is walking some at home in addition to cardiac rehab.   Reveiwed HEP with Pt. Pt was responsive and understand goals. Pt understand endpoints of exericse, weather precautions, THRR, and exercise Rx. Pt is walking at home for 30-45 minutes 2-3 days per week in addition to Cardiac Rehab.   Reviewed METs and goals with Pt. Pt MET level is 2.0. Pt is tolerating exercise well and increasing workloads gradually. Pt has a goal to increase strength enough to walk with just a cane instead of rollator. Will continue to work with Pt to reach his goals.   Pt withdrew from Cardiac Rehab due to coronavirus outbreak concerns.    Expected Outcomes  Will continue to monitor and progress Pt as tolerated.   Will continue to monitor and progress Pt as tolerated.   Will continue to monitor and progress Pt as tolerated.   Will continue to monitor and progress Pt as tolerated.   no documentation          Nutrition & Weight - Outcomes: Pre Biometrics - 07/03/18 1022    Pre Biometrics          Height  5' 10"  (1.778 m)    Weight   86.7 kg    Waist Circumference  45 inches    Hip Circumference  43 inches    Waist to Hip Ratio  1.05 %    BMI (Calculated)  27.43    Triceps Skinfold  23 mm    % Body Fat  32.1 %    Grip Strength  16 kg    Flexibility  0 in    Single Leg Stand  0 seconds            Nutrition: Nutrition Therapy & Goals - 07/14/18 0915    Nutrition Therapy          Diet  heart healthy, carb modified        Personal Nutrition Goals          Nutrition Goal  pt able to name foods that affect blood glucose    Personal Goal #2  Pt to identify and include more high iron foods to meals  Intervention Plan          Intervention  Prescribe, educate and counsel regarding individualized specific dietary modifications aiming towards targeted core components such as weight, hypertension, lipid management, diabetes, heart failure and other comorbidities.    Expected Outcomes  Short Term Goal: Understand basic principles of dietary content, such as calories, fat, sodium, cholesterol and nutrients.;Long Term Goal: Adherence to prescribed nutrition plan.           Nutrition Discharge: Nutrition Assessments - 09/04/18 1329    MEDFICTS Scores          Pre Score  18    Post Score  --   pt did not complete post survey          Education Questionnaire Score: Knowledge Questionnaire Score - 07/03/18 1025    Knowledge Questionnaire Score          Pre Score  16/24           Goals reviewed with patient; copy given to patient.

## 2018-09-08 ENCOUNTER — Encounter (HOSPITAL_COMMUNITY): Payer: Medicare PPO

## 2018-09-08 ENCOUNTER — Ambulatory Visit (HOSPITAL_COMMUNITY): Payer: Medicare PPO

## 2018-09-10 ENCOUNTER — Ambulatory Visit (HOSPITAL_COMMUNITY): Payer: Medicare PPO

## 2018-09-10 ENCOUNTER — Encounter (HOSPITAL_COMMUNITY): Payer: Medicare PPO

## 2018-09-11 ENCOUNTER — Telehealth: Payer: Self-pay | Admitting: Endocrinology

## 2018-09-11 NOTE — Telephone Encounter (Signed)
Patient called to check in on Alcohol Swabs that needed to be filled. Advised it was filled with [facility] on 09/05/18. Patient stated it was Lantus, advised patient we did not sent lantus. Patient disconnected.

## 2018-09-11 NOTE — Telephone Encounter (Signed)
Please clarify what this means? This is not a complete message.

## 2018-09-12 ENCOUNTER — Encounter (HOSPITAL_COMMUNITY): Payer: Medicare PPO

## 2018-09-12 ENCOUNTER — Ambulatory Visit (HOSPITAL_COMMUNITY): Payer: Medicare PPO

## 2018-09-15 ENCOUNTER — Encounter (HOSPITAL_COMMUNITY): Payer: Medicare PPO

## 2018-09-15 ENCOUNTER — Ambulatory Visit (HOSPITAL_COMMUNITY): Payer: Medicare PPO

## 2018-09-17 ENCOUNTER — Encounter (HOSPITAL_COMMUNITY): Payer: Medicare PPO

## 2018-09-17 ENCOUNTER — Ambulatory Visit (HOSPITAL_COMMUNITY): Payer: Medicare PPO

## 2018-09-19 ENCOUNTER — Encounter (HOSPITAL_COMMUNITY): Payer: Medicare PPO

## 2018-09-19 ENCOUNTER — Ambulatory Visit (HOSPITAL_COMMUNITY): Payer: Medicare PPO

## 2018-09-22 ENCOUNTER — Encounter (HOSPITAL_COMMUNITY): Payer: Medicare PPO

## 2018-09-22 ENCOUNTER — Ambulatory Visit (HOSPITAL_COMMUNITY): Payer: Medicare PPO

## 2018-09-24 ENCOUNTER — Ambulatory Visit (HOSPITAL_COMMUNITY): Payer: Medicare PPO

## 2018-09-24 ENCOUNTER — Encounter (HOSPITAL_COMMUNITY): Payer: Medicare PPO

## 2018-09-26 ENCOUNTER — Ambulatory Visit (HOSPITAL_COMMUNITY): Payer: Medicare PPO

## 2018-09-26 ENCOUNTER — Encounter (HOSPITAL_COMMUNITY): Payer: Medicare PPO

## 2018-09-29 ENCOUNTER — Encounter (HOSPITAL_COMMUNITY): Payer: Medicare PPO

## 2018-09-29 ENCOUNTER — Ambulatory Visit (HOSPITAL_COMMUNITY): Payer: Medicare PPO

## 2018-10-01 ENCOUNTER — Encounter (HOSPITAL_COMMUNITY): Payer: Medicare PPO

## 2018-10-01 ENCOUNTER — Ambulatory Visit (HOSPITAL_COMMUNITY): Payer: Medicare PPO

## 2018-10-03 ENCOUNTER — Encounter (HOSPITAL_COMMUNITY): Payer: Medicare PPO

## 2018-10-03 ENCOUNTER — Ambulatory Visit (HOSPITAL_COMMUNITY): Payer: Medicare PPO

## 2018-10-06 ENCOUNTER — Ambulatory Visit (HOSPITAL_COMMUNITY): Payer: Medicare PPO

## 2018-10-06 ENCOUNTER — Encounter (HOSPITAL_COMMUNITY): Payer: Medicare PPO

## 2018-10-06 ENCOUNTER — Other Ambulatory Visit: Payer: Self-pay

## 2018-10-06 MED ORDER — FREESTYLE LIBRE 14 DAY SENSOR MISC: 1.0000 | 3 refills | Status: DC

## 2018-10-06 MED ORDER — FREESTYLE LIBRE 14 DAY READER DEVI: 1.0000 | 0 refills | Status: DC

## 2018-10-08 ENCOUNTER — Ambulatory Visit (HOSPITAL_COMMUNITY): Payer: Medicare PPO

## 2018-10-08 ENCOUNTER — Encounter (HOSPITAL_COMMUNITY): Payer: Medicare PPO

## 2018-10-15 ENCOUNTER — Other Ambulatory Visit: Payer: Self-pay | Admitting: Endocrinology

## 2018-10-15 DIAGNOSIS — E1165 Type 2 diabetes mellitus with hyperglycemia: Secondary | ICD-10-CM

## 2018-10-15 DIAGNOSIS — Z794 Long term (current) use of insulin: Principal | ICD-10-CM

## 2018-10-20 ENCOUNTER — Encounter: Payer: Medicare PPO | Attending: Endocrinology | Admitting: Nutrition

## 2018-10-20 ENCOUNTER — Other Ambulatory Visit: Payer: Self-pay

## 2018-10-20 DIAGNOSIS — E119 Type 2 diabetes mellitus without complications: Secondary | ICD-10-CM | POA: Insufficient documentation

## 2018-10-20 DIAGNOSIS — Z794 Long term (current) use of insulin: Secondary | ICD-10-CM | POA: Insufficient documentation

## 2018-10-22 NOTE — Patient Instructions (Signed)
Scan the sensor at least every 8 hours:, right before bed, first thing in the morning,and at least one other time during the day.   Call if questions.

## 2018-10-22 NOTE — Progress Notes (Signed)
Clayton [name] and his wife were trained on the [place] CGM.  We discussed the difference  Between sensor readings and blood sugar readings.  They reported good understanding of this.  I set up his reader to scan the readings.  His phone was not able to get the app.  He was shown how to set up and insert the sensor, and he did this on his left inner arm.  His reader activated the sensor and he understood the need to scan at least every 8 hours to get a 24 hour reading.  He will come back in 1 week to download this.   We discussed that if he wants to continue with this, that he will need to test his blood sugar at least 4X/day for the next 30 days.  He was not receptive to this.  I am hopping that he will see the advantage of this when he returns for his follow up.  He will call me before he comes to let me know that he will be outside, for me to get his reader.  They had no final questions.  He was encouraged to read the manual, and he handouts for this.

## 2018-10-24 ENCOUNTER — Other Ambulatory Visit: Payer: Self-pay | Admitting: Endocrinology

## 2018-11-03 ENCOUNTER — Telehealth: Payer: Self-pay

## 2018-11-03 NOTE — Telephone Encounter (Signed)
Virtual Visit Pre-Appointment Phone Call  "(Wiliam [name]), I am calling you today to discuss your upcoming appointment. We are currently trying to limit exposure to the virus that causes COVID-19 by seeing patients at home rather than in the office."  1. "What is the BEST phone number to call the day of the visit?" - [phone]  2. "Do you have or have access to (through a family member/friend) a smartphone with video capability that we can use for your visit?" a. If no - list the appointment type as a PHONE visit in appointment notes  3. Confirm consent - "In the setting of the current Covid19 crisis, you are scheduled for a (phone or video) visit with your provider on (May 26) at (3:00pm).  Just as we do with many in-office visits, in order for you to participate in this visit, we must obtain consent.  If you'd like, I can send this to your mychart (if signed up) or email for you to review.  Otherwise, I can obtain your verbal consent now.  All virtual visits are billed to your insurance company just like a normal visit would be.  By agreeing to a virtual visit, we'd like you to understand that the technology does not allow for your provider to perform an examination, and thus may limit your provider's ability to fully assess your condition. If your provider identifies any concerns that need to be evaluated in person, we will make arrangements to do so.  Finally, though the technology is pretty good, we cannot assure that it will always work on either your or our end, and in the setting of a video visit, we may have to convert it to a phone-only visit.  In either situation, we cannot ensure that we have a secure connection.  Are you willing to proceed?" STAFF: Did the patient verbally acknowledge consent to telehealth visit? Document YES/NO here:YES  4. Advise patient to be prepared - "Two hours prior to your appointment, go ahead and check your blood pressure, pulse, oxygen saturation, and  your weight (if you have the equipment to check those) and write them all down. When your visit starts, your provider will ask you for this information. If you have an Apple Watch or Kardia device, please plan to have heart rate information ready on the day of your appointment. Please have a pen and paper handy nearby the day of the visit as well."  5. Give patient instructions for MyChart download to smartphone OR Doximity/Doxy.me as below if video visit (depending on what platform provider is using)  6. Inform patient they will receive a phone call 15 minutes prior to their appointment time (may be from unknown caller ID) so they should be prepared to answer    Clayton [name] has been deemed a candidate for a follow-up tele-health visit to limit community exposure during the Covid-19 pandemic. I spoke with the patient via phone to ensure availability of phone/video source, confirm preferred email & phone number, and discuss instructions and expectations.  I reminded Clayton [name] to be prepared with any vital sign and/or heart rhythm information that could potentially be obtained via home monitoring, at the time of his visit. I reminded Clayton [name] to expect a phone call prior to his visit.  Clayton [name], [place] 11/03/2018 11:19 AM   INSTRUCTIONS FOR DOWNLOADING THE MYCHART APP TO SMARTPHONE  - The patient must first make sure to have activated MyChart and  know their login information - If Apple, go to [employer] and type in MyChart in the search bar and download the app. If Android, ask patient to go to [employer] and type in [place] in the search bar and download the app. The app is free but as with any other app downloads, their phone may require them to verify saved payment information or Apple/Android password.  - The patient will need to then log into the app with their MyChart username and password, and select [facility] as their healthcare provider to  link the account. When it is time for your visit, go to the MyChart app, find appointments, and click Begin Video Visit. Be sure to Select Allow for your device to access the Microphone and Camera for your visit. You will then be connected, and your provider will be with you shortly.  **If they have any issues connecting, or need assistance please contact MyChart service desk [phone])**  **If using a computer, in order to ensure the best quality for their visit they will need to use either of the following Internet Browsers: [employer], or Google Chrome**  IF USING DOXIMITY or DOXY.ME - The patient will receive a link just prior to their visit by text.     FULL LENGTH CONSENT FOR TELE-HEALTH VISIT   I hereby voluntarily request, consent and authorize [facility] and its employed or contracted physicians, physician assistants, nurse practitioners or other licensed health care professionals (the Practitioner), to provide me with telemedicine health care services (the "Services") as deemed necessary by the treating Practitioner. I acknowledge and consent to receive the Services by the Practitioner via telemedicine. I understand that the telemedicine visit will involve communicating with the Practitioner through live audiovisual communication technology and the disclosure of certain medical information by electronic transmission. I acknowledge that I have been given the opportunity to request an in-person assessment or other available alternative prior to the telemedicine visit and am voluntarily participating in the telemedicine visit.  I understand that I have the right to withhold or withdraw my consent to the use of telemedicine in the course of my care at any time, without affecting my right to future care or treatment, and that the Practitioner or I may terminate the telemedicine visit at any time. I understand that I have the right to inspect all information obtained  and/or recorded in the course of the telemedicine visit and may receive copies of available information for a reasonable fee.  I understand that some of the potential risks of receiving the Services via telemedicine include:  Marland Kitchen Delay or interruption in medical evaluation due to technological equipment failure or disruption; . Information transmitted may not be sufficient (e.g. poor resolution of images) to allow for appropriate medical decision making by the Practitioner; and/or  . In rare instances, security protocols could fail, causing a breach of personal health information.  Furthermore, I acknowledge that it is my responsibility to provide information about my medical history, conditions and care that is complete and accurate to the best of my ability. I acknowledge that Practitioner's advice, recommendations, and/or decision may be based on factors not within their control, such as incomplete or inaccurate data provided by me or distortions of diagnostic images or specimens that may result from electronic transmissions. I understand that the practice of medicine is not an exact science and that Practitioner makes no warranties or guarantees regarding treatment outcomes. I acknowledge that I will receive a copy of this  consent concurrently upon execution via email to the email address I last provided but may also request a printed copy by calling the office of [facility].    I understand that my insurance will be billed for this visit.   I have read or had this consent read to me. . I understand the contents of this consent, which adequately explains the benefits and risks of the Services being provided via telemedicine.  . I have been provided ample opportunity to ask questions regarding this consent and the Services and have had my questions answered to my satisfaction. . I give my informed consent for the services to be provided through the use of telemedicine in my medical care  By  participating in this telemedicine visit I agree to the above.

## 2018-11-06 ENCOUNTER — Telehealth: Payer: Self-pay | Admitting: Cardiology

## 2018-11-06 NOTE — Telephone Encounter (Signed)
Mychart, no smartphone (if he doesn't answer, call daughter's phone), consent, pre reg complete 11/06/18 AF

## 2018-11-11 ENCOUNTER — Telehealth: Payer: Self-pay | Admitting: *Deleted

## 2018-11-11 ENCOUNTER — Encounter: Payer: Self-pay | Admitting: Cardiology

## 2018-11-11 ENCOUNTER — Telehealth (INDEPENDENT_AMBULATORY_CARE_PROVIDER_SITE_OTHER): Payer: Medicare PPO | Admitting: Cardiology

## 2018-11-11 VITALS — BP 112/59 | HR 83 | Ht 70.0 in | Wt 180.0 lb

## 2018-11-11 DIAGNOSIS — I2510 Atherosclerotic heart disease of native coronary artery without angina pectoris: Secondary | ICD-10-CM | POA: Diagnosis not present

## 2018-11-11 DIAGNOSIS — R9431 Abnormal electrocardiogram [ECG] [EKG]: Secondary | ICD-10-CM

## 2018-11-11 DIAGNOSIS — E1169 Type 2 diabetes mellitus with other specified complication: Secondary | ICD-10-CM

## 2018-11-11 DIAGNOSIS — R001 Bradycardia, unspecified: Secondary | ICD-10-CM

## 2018-11-11 DIAGNOSIS — E785 Hyperlipidemia, unspecified: Secondary | ICD-10-CM

## 2018-11-11 DIAGNOSIS — N184 Chronic kidney disease, stage 4 (severe): Secondary | ICD-10-CM

## 2018-11-11 DIAGNOSIS — I10 Essential (primary) hypertension: Secondary | ICD-10-CM

## 2018-11-11 NOTE — Telephone Encounter (Signed)
Spoke to patient - instruction given  From tel visit 5/26 . avs summary will be sent via mychart patient verbalized understanding.

## 2018-11-11 NOTE — Patient Instructions (Addendum)
Medication Instructions:  none  If you need a refill on your cardiac medications before your next appointment, please call your pharmacy.   Lab work: None    Testing/Procedures: n/a  Follow-Up: At [facility], you and your health needs are our priority.  As part of our continuing mission to provide you with exceptional heart care, we have created designated Provider Care Teams.  These Care Teams include your primary Cardiologist (physician) and Advanced Practice Providers (APPs -  Physician Assistants and Nurse Practitioners) who all work together to provide you with the care you need, when you need it. . You will need a follow up appointment in  6  Months-Nov 2020.  Please call our office 2 months in advance to schedule this appointment.  You may see Glenetta Hew, MD or one of the following Advanced Practice Providers on your designated Care Team:   . Rosaria Ferries, PA-C . Jory Sims, DNP, ANP  Any Other Special Instructions Will Be Listed Below (If Applicable).  -- Will look into Cardiac Rehab options -- Try to do some walking with the cane to assess stability (maybe if walking with someone else - just in case).   Continue walking either way.

## 2018-11-11 NOTE — Progress Notes (Signed)
Virtual Visit via Telephone Note   This visit type was conducted due to national recommendations for restrictions regarding the COVID-19 Pandemic (e.g. social distancing) in an effort to limit this patient's exposure and mitigate transmission in our community.  Due to his co-morbid illnesses, this patient is at least at moderate risk for complications without adequate follow up.  This format is felt to be most appropriate for this patient at this time.  All issues noted in this document were discussed and addressed.  A limited physical exam was performed with this format.  Please refer to the patient's chart for his consent to telehealth for [facility].   Video not available.   Patient has given verbal permission to conduct this visit via virtual appointment and to bill insurance 11/12/2018 4:25 PM     Evaluation Performed:  Follow-up visit  Date:  11/12/2018   ID:  Clayton [name], DOB 1928-05-10, MRN [id number]  Patient Location: Other:  Daughter's house -- having Sewage issues @ his house; hopes to get home in ~1-2 d Provider Location: Home  PCP:  Lujean Amel, MD  Cardiologist:  Glenetta Hew, MD  Electrophysiologist:  None   Chief Complaint:  3-4 month f/u - CAD  History of Present Illness:    Clayton [name] is a 85 y.o. male with PMH notable for Inferior STEMI (Nov 2019) - PCI RCA with ? GI bleed who presents via audio/video conferencing for a telehealth visit today.  Clayton [name] was last seen back in February after having received IV iron treatment for GI bleed anemia in December.  Was finally starting to get some his energy level back but still noted fatigue.  No angina symptoms. --We decided to forego further ischemic evaluation in the absence of symptoms.  He did not want to discuss staged PCI or possible CABG at that time.  Did not feel is any strong enough.  Interval History:  [name] is from a cardiac standpoint.  He is walking anywhere from 1 to 1-1/2 miles  a day, albeit relatively slow 1 with a walker, but is doing this without any resting or exertional chest tightness/pressure or dyspnea.  He is no longer being troubled by edema, has reduced his Lasix dose to just once daily (now taking it every day because he is not out for 2 days of the week playing cards due to COVID-19).  No PND or orthopnea, now sleeping in the bed as opposed to recliner. He denies any rapid irregular heartbeats palpitations.  No claudication.  He does have some mild bruising, but much better since stopping aspirin.  No obvious bleeding.  Cardiovascular ROS: positive for - Slowly getting back to "full energy" - still walking with walker negative for - chest pain, dyspnea on exertion, edema, irregular heartbeat, murmur, orthopnea, palpitations, paroxysmal nocturnal dyspnea, rapid heart rate, shortness of breath or near syncope; TIA/amaurosis fugax.  With no active cardiac symptoms, he would be in favor of conservative management and not doing further testing.  The main thing he would like to do is get back to walking with his cane instead of his walker. -->  Going to try to gradually start walking with a cane to see how he does.  The patient does not have symptoms concerning for COVID-19 infection (fever, chills, cough, or new shortness of breath).   The patient is practicing social distancing.  1 Daughter lives with him & gets groceries etc.   ROS:  Please see the history of  present illness.    Review of Systems  Constitutional: Negative for chills, fever, malaise/fatigue and weight loss.  HENT: Negative for congestion and nosebleeds.   Respiratory: Positive for cough (Had a coughing spell a few months ago - cleared up with meds from a visiting PA.). Negative for shortness of breath.   Gastrointestinal: Negative for heartburn, nausea and vomiting.  Genitourinary: Negative for dysuria and hematuria.  Musculoskeletal: Positive for joint pain (hips & knees).  Neurological:  Positive for weakness (global - but better). Negative for dizziness.       Still poor balance - uses walker.  Hoping to do rehab & get back to using a cane  Psychiatric/Behavioral: Positive for memory loss (some). The patient is not nervous/anxious and does not have insomnia (takes 1-2 sleeping pills - may sleep 4-5 hrs).   All other systems reviewed and are negative.  Past Medical History:  Diagnosis Date  . Anemia   . BPH (benign prostatic hyperplasia)   . CAD (coronary artery disease)    hx of stemi  04-2018   . Chronic renal failure   . Chronic renal insufficiency   . COPD (chronic obstructive pulmonary disease) ([employer])   . DDD (degenerative disc disease), lumbar   . Diabetes mellitus   . Hyperlipidemia   . Hyperparathyroidism ([place])   . Hypertension   . IDA (iron deficiency anemia)   . Neuromuscular disorder ([place])   . OSA on CPAP   . Osteomyelitis of forearm ([place])   . Osteoporosis    Past Surgical History:  Procedure Laterality Date  . CORONARY STENT INTERVENTION N/A 04/20/2018   Procedure: CORONARY STENT INTERVENTION;  Surgeon: Leonie Man, MD;  Location: [facility] INVASIVE CV LAB;;;    . [place] ACUTE MI REVASCULARIZATION N/A 04/20/2018   Procedure: Coronary/Graft Acute MI Revascularization;  Surgeon: Leonie Man, MD;  Location: [facility] CV LAB;  Service: Cardiovascular;  Laterality: N/A; --> after initial PTCA restoring flow down the RCA, there was diffuse proximal to mid disease treated with a DES SYNERGY 3 X 38 ( 3.78mm)  . EYE SURGERY    . HOLTER MONITOR  05/2018   Sinus rhythm noted with sinus bradycardia.  Also sinus rhythm with 2-1 AV block noted.  Maximum heart rate was sinus tachycardia 123 bpm.  Rare PVCs accelerated idioventricular rhythm noted.  Intermittent Wenckebach block noted.  . I&D EXTREMITY Right 08/13/2016   Procedure: IRRIGATION AND DEBRIDEMENT RIGHT ELBOW AND HAND;  Surgeon: Milly Jakob, MD;  Location: [facility] ORS;  Service: Orthopedics;   Laterality: Right;  . LEFT HEART CATH AND CORONARY ANGIOGRAPHY N/A 04/20/2018   Procedure: LEFT HEART CATH AND CORONARY ANGIOGRAPHY;  Surgeon: Leonie Man, MD;  Location: [facility] CV LAB; mRCA 100%&80% (after 55% tapering in pRCA), mLM-ostLAD 40% w/ 85% ostCx, ost-pCx 65% @ Om1 w/ ost 85%. pLAD 60% & 50% after D1 with distal 75%, ostD1 65% & 90% after small side branch.    . TRANSTHORACIC ECHOCARDIOGRAM  04/21/2018   EF 50-55%. Posterior Wall HK. Severe RV HK, dilated IVC. Temp wire in place  . VASECTOMY    . VIDEO BRONCHOSCOPY Bilateral 08/25/2012   Procedure: VIDEO BRONCHOSCOPY WITHOUT FLUORO;  Surgeon: Brand Males, MD;  Location: [facility];  Service: Cardiopulmonary;  Laterality: Bilateral;            Current Meds  Medication Sig  . ACCU-CHEK SOFTCLIX LANCETS lancets TEST BLOOD SUGAR FOUR TIMES DAILY  . albuterol (PROVENTIL HFA;VENTOLIN HFA) 108 (90 Base) MCG/ACT inhaler  Inhale 1-2 puffs into the lungs every 6 (six) hours as needed for wheezing or shortness of breath.  . Alcohol Swabs (B-D SINGLE USE SWABS REGULAR) PADS USE 7 SWABS DAILY AS DIRECTED  . atorvastatin (LIPITOR) 80 MG tablet Take 0.5 tablets (40 mg total) by mouth daily.  . benzonatate (TESSALON) 100 MG capsule Take by mouth 3 (three) times daily as needed for cough.  . budesonide-formoterol (SYMBICORT) 160-4.5 MCG/ACT inhaler Inhale 2 puffs into the lungs 2 (two) times daily.  . cholecalciferol (VITAMIN D) 1000 units tablet Take 2,000 Units by mouth daily.  . Continuous Blood Gluc Receiver (FREESTYLE LIBRE 14 DAY READER) DEVI 1 each by Does not apply route every 14 (fourteen) days. Use reader to monitor blood sugar continuously with freestyle libre sensor.  . Continuous Blood Gluc Sensor (FREESTYLE LIBRE 14 DAY SENSOR) MISC 1 each by Does not apply route every 14 (fourteen) days. Apply 1 sensor to body once every 14 days to monitor blood sugars continuously.  . DROPLET INSULIN SYRINGE 31G X 5/16" 0.3 ML MISC  USE THREE TIMES DAILY  . fesoterodine (TOVIAZ) 4 MG TB24 tablet Take 4 mg by mouth daily.  . finasteride (PROSCAR) 5 MG tablet TAKE 1 TABLET EVERY DAY  . furosemide (LASIX) 20 MG tablet Take 1 tablet (20 mg total) by mouth daily. Take 2 tabs 5 times weekly. (Patient taking differently: Take 20 mg by mouth See admin instructions. Take 1 tabs 5 times weekly (Sunday, Monday, Tuesday, Thursday, and Saturday))  . glucose blood test strip Use as instructed to test blood sugars 4 times daily  . insulin aspart (NOVOLOG) 100 UNIT/ML injection TAKE 8 UNITS UNDER THE SKIN DAILY Monday-Friday AND ON Saturday AND Sunday TAKE 6 UNITS AT BREAKFAST AND LUNCH, AND 8 UNITS AT DINNER. (Patient taking differently: TAKE 7 UNITS IN THE MORNING, 6 UNITS AT LUNCH, AND 9 UNITS AT SUPPER.)  . insulin glargine (LANTUS) 100 UNIT/ML injection Inject 26 Units into the skin daily. INJECT 26 UNITS UNDER THE SKIN EVERY MORNING.  . isosorbide mononitrate (IMDUR) 30 MG 24 hr tablet Take 1 tablet (30 mg total) by mouth daily.  . Melatonin 5 MG TABS Take 2 tablets by mouth at bedtime.   . Multiple Vitamin (MULTIVITAMIN WITH MINERALS) TABS tablet Take 1 tablet by mouth daily.  Marland Kitchen omeprazole (PRILOSEC) 20 MG capsule Take 1 capsule (20 mg total) by mouth daily.  . ticagrelor (BRILINTA) 90 MG TABS tablet Take 1 tablet (90 mg total) by mouth 2 (two) times daily.     Allergies:   Doxycycline; Levaquin [levofloxacin in d5w]; Nitrous oxide; Penicillins; Tape; and Diltiazem   Social History   Tobacco Use  . Smoking status: Former Smoker    Packs/day: 3.00    Years: 33.00    Pack years: 99.00    Types: Cigarettes    Last attempt to quit: 06/19/1973    Years since quitting: 45.4  . Smokeless tobacco: Former Systems developer    Quit date: 09/26/1975  Substance Use Topics  . Alcohol use: No  . Drug use: No     Family Hx: The patient's family history includes CVA in his father; Cancer in his sister; Diabetes Mellitus II in an other family member;  Hypertension in an other family member; Other in his brother and father.   Prior CV studies:   The following studies were reviewed today: . None:  Labs/Other Tests and Data Reviewed:    EKG:  No ECG reviewed.  Recent Labs: 02/07/2018: TSH 2.60  08/07/2018: ALT 21; BUN 40; Creatinine, Ser 2.50; Hemoglobin 9.7; Platelets 413.0; Potassium 4.0; Sodium 139   Recent Lipid Panel Lab Results  Component Value Date/Time   CHOL 108 05/09/2018 08:20 AM   TRIG 83.0 05/09/2018 08:20 AM   HDL 35.30 (L) 05/09/2018 08:20 AM   CHOLHDL 3 05/09/2018 08:20 AM   LDLCALC 56 05/09/2018 08:20 AM    Wt Readings from Last 3 Encounters:  11/11/18 180 lb (81.6 kg)  08/25/18 190 lb 6.4 oz (86.4 kg)  08/08/18 190 lb (86.2 kg)     Objective:    Vital Signs:  BP (!) 112/59   Pulse 83   Ht 5\' 10"  (1.778 m)   Wt 180 lb (81.6 kg)   SpO2 96%   BMI 25.83 kg/m   VITAL SIGNS:  reviewed GEN:  no acute distress RESPIRATORY:  non-labored NEURO:  A&O x 3; answers ? appropriately PSYCH:  normal affect   ASSESSMENT & PLAN:    Problem List Items Addressed This Visit    CAD, multiple vessel - Primary (Chronic)    Multivessel disease with PCI to the RCA still has existing ostial circumflex and diffuse LAD disease.  Based on previous discussions, plan for now will be to continue to treat medically unless symptoms warrant further evaluation.  He would prefer to avoid further testing including noninvasive testing.    At present, the plan will be for him to continue rehabilitation regaining strength.  Likely not stable [place] month to undergo major surgery such as CABG. Plan: Atorvastatin and Brilinta but okay to stop aspirin.  Continue low-dose Imdur.  Holding off on beta-blocker to avoid fatigue and hypotension.      Relevant Orders   AMB referral to cardiac rehabilitation   Chronic kidney disease, stage IV (severe) (HCC) (Chronic)    With baseline creatinine in the 2.5 range, will be reluctant to pursue any  further invasive evaluation unless he has significant symptoms.      Essential hypertension (Chronic)    If any blood pressures were low.  Unable to tolerate anything with imdur.      Hyperlipidemia associated with type 2 diabetes mellitus (HCC) (Chronic)    LDL pretty well within goal.  We have reduced statin.  Continue current dose now.      Inferior ST segment elevation (HCC) (Chronic)   Relevant Orders   AMB referral to cardiac rehabilitation   Symptomatic bradycardia    Avoiding beta-blocker and other AV dual agents because of prior bradycardia issues and fatigue.        Mr. Granquist has multivessel disease with PCI to the RCA, with pretty significant circumflex and LAD/diagonal disease, he is not having any active anginal symptoms or heart failure symptoms.  With his relatively unsteady gait and lowish blood pressure on no medications besides Imdur, I am reluctant to consider adding an ARB.  He had pretty significant bradycardia while in the hospital and therefore I would not start a beta-blocker or other AV nodal agent.  --Based on previous discussions and today's reiteration, without him having active angina symptoms or heart failure symptoms, we are not can proceed any further testing.  Continue conservative management of his multivessel CAD.  Partly because of his renal disease and baseline frailty, he is concerned about further procedures in the absence of symptoms.  He is on statin with well-controlled lipids.  He seems to be overall recovering from his MI followed by GI bleed.  No further bleeding issues.  He will continue  on Brilinta alone at 90 mg until November at which time I would consider either reducing to 60 mg twice daily or to Plavix.  After recovering from an MI with multivessel disease, I would like to get him to cardiac rehab, but he may also benefit from some physical therapy for balance issues.  We will try to see what is available during the COVID-19  restrictions.  For now no medication changes.  We will try to get him to cardiac rehab when available. Follow-up in November.   COVID-19 Education: The signs and symptoms of COVID-19 were discussed with the patient and how to seek care for testing (follow up with PCP or arrange E-visit).   The importance of social distancing was discussed today.  Time:   Today, I have spent 22 minutes with the patient with telehealth technology discussing the above problems.     Medication Adjustments/Labs and Tests Ordered: Current medicines are reviewed at length with the patient today.  Concerns regarding medicines are outlined above.  Medication Instructions: no new changes  Tests Ordered: Orders Placed This Encounter  Procedures  . AMB referral to cardiac rehabilitation  none  Medication Changes: No orders of the defined types were placed in this encounter. none  Disposition:  Follow up in 5 month(s)    Signed, Glenetta Hew, MD  11/12/2018 4:25 PM    [facility]

## 2018-11-12 ENCOUNTER — Encounter: Payer: Self-pay | Admitting: Cardiology

## 2018-11-12 NOTE — Assessment & Plan Note (Addendum)
Multivessel disease with PCI to the RCA still has existing ostial circumflex and diffuse LAD disease.  Based on previous discussions, plan for now will be to continue to treat medically unless symptoms warrant further evaluation.  He would prefer to avoid further testing including noninvasive testing.    At present, the plan will be for him to continue rehabilitation regaining strength.  Likely not stable [place] month to undergo major surgery such as CABG. Plan: Atorvastatin and Brilinta but okay to stop aspirin.  Continue low-dose Imdur.  Holding off on beta-blocker to avoid fatigue and hypotension.

## 2018-11-12 NOTE — Assessment & Plan Note (Signed)
With baseline creatinine in the 2.5 range, will be reluctant to pursue any further invasive evaluation unless he has significant symptoms.

## 2018-11-12 NOTE — Assessment & Plan Note (Signed)
Avoiding beta-blocker and other AV dual agents because of prior bradycardia issues and fatigue.

## 2018-11-12 NOTE — Assessment & Plan Note (Signed)
If any blood pressures were low.  Unable to tolerate anything with imdur.

## 2018-11-12 NOTE — Assessment & Plan Note (Signed)
LDL pretty well within goal.  We have reduced statin.  Continue current dose now.

## 2018-11-17 ENCOUNTER — Other Ambulatory Visit: Payer: Self-pay

## 2018-11-17 MED ORDER — OMEPRAZOLE 20 MG PO CPDR: 20.0000 mg | DELAYED_RELEASE_CAPSULE | Freq: Every day | ORAL | 3 refills | Status: DC

## 2018-11-24 ENCOUNTER — Telehealth (HOSPITAL_COMMUNITY): Payer: Self-pay | Admitting: *Deleted

## 2018-11-24 ENCOUNTER — Other Ambulatory Visit: Payer: Self-pay | Admitting: Endocrinology

## 2018-11-24 NOTE — Telephone Encounter (Signed)
Called and spoke to pt.  Pt wanted to return to cardiac rehab to help with strengthening so he can transition from the walker to the cane.  Explained pt that right now we remain closed for group exercise due to Covid 19. We do however have the option for virtual cardiac rehab.  Unfortunately pt does not have a smart device in order to access the app. In talking with pt who has slow garbled speech about his needs and his current functional capacity, pt would be more appropriate for in home physical therapy with supervision. Pt prepares simple food for breakfast and lunch.  Pt granddaughter provides him dinner.  Pt no longer drives as of this year.  Pt can perform daily bathing and dressing.  Will ask pt primary MD Dr. Dorthy Cooler for assistance with getting him physical therapy. Pt thanked me for the call. Cherre Huger, BSN Cardiac and Training and development officer

## 2018-11-26 ENCOUNTER — Other Ambulatory Visit: Payer: Self-pay

## 2018-11-26 DIAGNOSIS — D631 Anemia in chronic kidney disease: Secondary | ICD-10-CM | POA: Diagnosis not present

## 2018-11-26 DIAGNOSIS — M19071 Primary osteoarthritis, right ankle and foot: Secondary | ICD-10-CM | POA: Diagnosis not present

## 2018-11-26 DIAGNOSIS — I129 Hypertensive chronic kidney disease with stage 1 through stage 4 chronic kidney disease, or unspecified chronic kidney disease: Secondary | ICD-10-CM | POA: Diagnosis not present

## 2018-11-26 DIAGNOSIS — I2510 Atherosclerotic heart disease of native coronary artery without angina pectoris: Secondary | ICD-10-CM | POA: Diagnosis not present

## 2018-11-26 DIAGNOSIS — J441 Chronic obstructive pulmonary disease with (acute) exacerbation: Secondary | ICD-10-CM | POA: Diagnosis not present

## 2018-11-26 DIAGNOSIS — E1122 Type 2 diabetes mellitus with diabetic chronic kidney disease: Secondary | ICD-10-CM | POA: Diagnosis not present

## 2018-11-26 DIAGNOSIS — N183 Chronic kidney disease, stage 3 (moderate): Secondary | ICD-10-CM | POA: Diagnosis not present

## 2018-11-26 DIAGNOSIS — M170 Bilateral primary osteoarthritis of knee: Secondary | ICD-10-CM | POA: Diagnosis not present

## 2018-11-26 DIAGNOSIS — M160 Bilateral primary osteoarthritis of hip: Secondary | ICD-10-CM | POA: Diagnosis not present

## 2018-11-26 NOTE — Progress Notes (Signed)
Patient ID: Clayton [name], male   DOB: 05-19-1928, 85 y.o.   MRN: [id number]  Today's office visit was provided via telemedicine using a telephone call to the patient Patient has been explained the limitations of evaluation and management by telemedicine and the availability of in person appointments.  The patient understood the limitations and agreed to proceed. Patient also understood that the telehealth visit is billable. . Location of the patient: Home . Location of the provider: Office Only the patient and myself were participating in the encounter  Reason for Appointment: Followup of diabetes and various issues  History of Present Illness   Type 2 DIABETES MELITUS diagnosed 1989  He has had long-standing diabetes and has been on basal bolus insulin for the last few years Since 2014 he has required larger doses of insulin especially with having to take periodic courses of steroids for his pulmonary problems Also his Actos was stopped because of tendency to edema and metformin stopped because of renal dysfunction  Insulin regimen: Novolog 7-6-9 units before meals, Lantus 26 units in a.m., using syringes  A1c is last at 6.8 done in 2/20, no recent labs available  Current blood sugar patterns and problems identified:  His blood sugars are now being monitored with the freestyle libre that he started in 5/20 after instructions from nurse educator  Not clear how accurate this is compared to fingersticks  Patient was seen remotely and unable to download his meter  His blood sugars are looking fairly close to normal most of the time recently but only able to get records for the last 3 days  Hypoglycemia: This may be occasional, sometimes precipitated by his walking  Also he has had a low reading of 63 in the morning once  He is walking more now and does a little bit in the morning and another round in the afternoon and can walk up to 2 miles a day  However he says  that if he sees his blood sugar getting lower he will stop walking  Not adjusting his NovoLog based on what he is eating or whether he is planning to walk after the meal or not  Hypoglycemia:  recently none   Oral hypoglycemic drugs: None           Monitors blood glucose:  3-4 times a day   Glucometer: Freestyle libre Glucose  monitor readings from patient reading out his numbers:   PRE-MEAL Fasting Lunch Dinner Bedtime Overall  Glucose range:  63-138  111, 122  94, 109  93, 118   Mean/median:      114   POST-MEAL PC Breakfast PC Lunch PC Dinner  Glucose range:  122  74, 163   Mean/median:        Meals: 3 meals per day.  breakfast: oatmeal / grits, with peanut butter, for lunch he will have a sandwich with apple and grapefruit   Dinner usually at 6:30 pm        Physical activity: exercise: Walking as above  Weight history:  Wt Readings from Last 3 Encounters:  11/11/18 180 lb (81.6 kg)  08/25/18 190 lb 6.4 oz (86.4 kg)  08/08/18 190 lb (86.2 kg)   LABS:  Lab Results  Component Value Date   HGBA1C 6.8 (H) 08/07/2018   HGBA1C 6.6 (H) 05/09/2018   HGBA1C 6.3 (H) 04/21/2018   Lab Results  Component Value Date   MICROALBUR 5.4 (H) 08/07/2018   Sharon  56 05/09/2018   CREATININE 2.50 (H) 08/07/2018   Problem 2:  RENAL insufficiency: He has had chronic increase in creatinine, was 1.7 in 2014 with slow progression . This has been related to nephropathy and glomerulosclerosis Has been evaluated by nephrologist  Renal ultrasound did not show any abnormality  Has been followed by nephrologist, last visit 2/20  No recent labs available:  Lab Results  Component Value Date   CREATININE 2.50 (H) 08/07/2018   CREATININE 2.44 (H) 06/13/2018   CREATININE 2.57 (H) 05/09/2018   CREATININE 2.05 (H) 04/26/2018     He has had a normal PTH, not on calcitriol and only on vitamin D   Lab Results  Component Value Date   PTH 54 07/04/2017   CALCIUM 9.2 08/07/2018    CAION 1.18 04/20/2018   PHOS 2.3 04/30/2014      Allergies as of 11/27/2018      Reactions   Doxycycline Rash   Levaquin [levofloxacin In D5w]    Caused chest pain and heartburn   Nitrous Oxide Other (See Comments)   Reaction:  Unknown    Penicillins Other (See Comments)   Reaction:  Unknown Has patient had a PCN reaction causing immediate rash, facial/tongue/throat swelling, SOB or lightheadedness with hypotension: Unsure Has patient had a PCN reaction causing severe rash involving mucus membranes or skin necrosis: Unsure Has patient had a PCN reaction that required hospitalization Unsure  Has patient had a PCN reaction occurring within the last 10 years: No If all of the above answers are "NO", then may proceed with Cephalosporin use.   Tape Other (See Comments)   Reaction:  Tears pts skin    Diltiazem Rash      Medication List       Accurate as of November 27, 2018  4:06 PM. If you have any questions, ask your nurse or doctor.        Accu-Chek Softclix Lancets lancets TEST BLOOD SUGAR FOUR TIMES DAILY   albuterol 108 (90 Base) MCG/ACT inhaler Commonly known as: VENTOLIN HFA Inhale 1-2 puffs into the lungs every 6 (six) hours as needed for wheezing or shortness of breath.   atorvastatin 80 MG tablet Commonly known as: LIPITOR Take 0.5 tablets (40 mg total) by mouth daily.   B-D SINGLE USE SWABS REGULAR Pads USE 7 SWABS DAILY AS DIRECTED   benzonatate 100 MG capsule Commonly known as: TESSALON Take by mouth 3 (three) times daily as needed for cough.   budesonide-formoterol 160-4.5 MCG/ACT inhaler Commonly known as: Symbicort Inhale 2 puffs into the lungs 2 (two) times daily.   cholecalciferol 1000 units tablet Commonly known as: VITAMIN D Take 2,000 Units by mouth daily.   Droplet Insulin Syringe 31G X 5/16" 0.3 ML Misc Generic drug: Insulin Syringe-Needle U-100 USE THREE TIMES DAILY   finasteride 5 MG tablet Commonly known as: PROSCAR TAKE 1 TABLET EVERY  DAY   FreeStyle Libre 14 Day Reader Kerrin Mo 1 each by Does not apply route every 14 (fourteen) days. Use reader to monitor blood sugar continuously with freestyle libre sensor.   FreeStyle Libre 14 Day Sensor Misc 1 each by Does not apply route every 14 (fourteen) days. Apply 1 sensor to body once every 14 days to monitor blood sugars continuously.   furosemide 20 MG tablet Commonly known as: LASIX Take 1 tablet (20 mg total) by mouth See admin instructions. Take 1 tabs 5 times weekly (Sunday, Monday, Tuesday, Thursday, and Saturday)   glucose blood test strip Use as  instructed to test blood sugars 4 times daily   insulin aspart 100 UNIT/ML injection Commonly known as: novoLOG TAKE 8 UNITS UNDER THE SKIN DAILY Monday-Friday AND ON Saturday AND Sunday TAKE 6 UNITS AT BREAKFAST AND LUNCH, AND 8 UNITS AT DINNER. What changed: additional instructions   insulin glargine 100 UNIT/ML injection Commonly known as: LANTUS Inject 26 Units into the skin daily. INJECT 26 UNITS UNDER THE SKIN EVERY MORNING.   isosorbide mononitrate 30 MG 24 hr tablet Commonly known as: IMDUR Take 1 tablet (30 mg total) by mouth daily.   levofloxacin 500 MG tablet Commonly known as: LEVAQUIN Take 500 mg by mouth daily.   Melatonin 5 MG Tabs Take 2 tablets by mouth at bedtime.   multivitamin with minerals Tabs tablet Take 1 tablet by mouth daily.   omeprazole 20 MG capsule Commonly known as: PRILOSEC Take 1 capsule (20 mg total) by mouth daily.   ticagrelor 90 MG Tabs tablet Commonly known as: BRILINTA Take 1 tablet (90 mg total) by mouth 2 (two) times daily.   Toviaz 4 MG Tb24 tablet Generic drug: fesoterodine Take 4 mg by mouth daily.       Allergies:  Allergies  Allergen Reactions  . Doxycycline Rash  . Levaquin [Levofloxacin In D5w]     Caused chest pain and heartburn  . Nitrous Oxide Other (See Comments)    Reaction:  Unknown   . Penicillins Other (See Comments)    Reaction:  Unknown  Has patient had a PCN reaction causing immediate rash, facial/tongue/throat swelling, SOB or lightheadedness with hypotension: Unsure Has patient had a PCN reaction causing severe rash involving mucus membranes or skin necrosis: Unsure Has patient had a PCN reaction that required hospitalization Unsure  Has patient had a PCN reaction occurring within the last 10 years: No If all of the above answers are "NO", then may proceed with Cephalosporin use.  . Tape Other (See Comments)    Reaction:  Tears pts skin   . Diltiazem Rash    Past Medical History:  Diagnosis Date  . Anemia   . BPH (benign prostatic hyperplasia)   . CAD (coronary artery disease)    hx of stemi  04-2018   . Chronic renal failure   . Chronic renal insufficiency   . COPD (chronic obstructive pulmonary disease) ([place])   . DDD (degenerative disc disease), lumbar   . Diabetes mellitus   . Hyperlipidemia   . Hyperparathyroidism ([place])   . Hypertension   . IDA (iron deficiency anemia)   . Neuromuscular disorder ([place])   . OSA on CPAP   . Osteomyelitis of forearm ([place])   . Osteoporosis     Past Surgical History:  Procedure Laterality Date  . CORONARY STENT INTERVENTION N/A 04/20/2018   Procedure: CORONARY STENT INTERVENTION;  Surgeon: Leonie Man, MD;  Location: [facility] INVASIVE CV LAB;;;    . [facility] ACUTE MI REVASCULARIZATION N/A 04/20/2018   Procedure: Coronary/Graft Acute MI Revascularization;  Surgeon: Leonie Man, MD;  Location: [facility] CV LAB;  Service: Cardiovascular;  Laterality: N/A; --> after initial PTCA restoring flow down the RCA, there was diffuse proximal to mid disease treated with a DES SYNERGY 3 X 38 ( 3.20mm)  . EYE SURGERY    . HOLTER MONITOR  05/2018   Sinus rhythm noted with sinus bradycardia.  Also sinus rhythm with 2-1 AV block noted.  Maximum heart rate was sinus tachycardia 123 bpm.  Rare PVCs accelerated idioventricular rhythm noted.  Intermittent Wenckebach  block noted.  . I&D  EXTREMITY Right 08/13/2016   Procedure: IRRIGATION AND DEBRIDEMENT RIGHT ELBOW AND HAND;  Surgeon: Milly Jakob, MD;  Location: [facility] ORS;  Service: Orthopedics;  Laterality: Right;  . LEFT HEART CATH AND CORONARY ANGIOGRAPHY N/A 04/20/2018   Procedure: LEFT HEART CATH AND CORONARY ANGIOGRAPHY;  Surgeon: Leonie Man, MD;  Location: [facility] CV LAB; mRCA 100%&80% (after 55% tapering in pRCA), mLM-ostLAD 40% w/ 85% ostCx, ost-pCx 65% @ Om1 w/ ost 85%. pLAD 60% & 50% after D1 with distal 75%, ostD1 65% & 90% after small side branch.    . TRANSTHORACIC ECHOCARDIOGRAM  04/21/2018   EF 50-55%. Posterior Wall HK. Severe RV HK, dilated IVC. Temp wire in place  . VASECTOMY    . VIDEO BRONCHOSCOPY Bilateral 08/25/2012   Procedure: VIDEO BRONCHOSCOPY WITHOUT FLUORO;  Surgeon: Brand Males, MD;  Location: [facility];  Service: Cardiopulmonary;  Laterality: Bilateral;    Family History  Problem Relation Age of Onset  . Diabetes Mellitus II Other   . Hypertension Other   . Other Father        complications from strep  . CVA Father   . Cancer Sister   . Other Brother        hip replacement complications     Social History:  reports that he quit smoking about 45 years ago. His smoking use included cigarettes. He has a 99.00 pack-year smoking history. He quit smokeless tobacco use about 43 years ago. He reports that he does not drink alcohol or use drugs.  Review of Systems  BLOOD PRESSURE:  He is not on any treatment for this and blood pressure is consistently normal    BP Readings from Last 3 Encounters:  11/11/18 (!) 112/59  08/25/18 120/76  08/08/18 (!) 116/52    HYPERKALEMIA, history of:  No recurrence,  watching his diet for high potassium foods   Lab Results  Component Value Date   K 4.0 08/07/2018        ANEMIA:   He has a  history of anemia related to renal dysfunction and mild iron deficiency  Has been treated with iron infusions   Lab Results  Component  Value Date   WBC 12.7 (H) 08/07/2018   HGB 9.7 (L) 08/07/2018   HCT 29.5 (L) 08/07/2018   MCV 96.1 08/07/2018   PLT 413.0 (H) 08/07/2018   Lab Results  Component Value Date   IRON 21 (L) 04/23/2018   TIBC 230 (L) 04/23/2018   FERRITIN 66.2 08/07/2018    He has had sleep apnea, using CPAP and this is being adjusted by pulmonologist.    He has had chronic lower urinary tract problems and BPH followed by urologist    HYPERLIPIDEMIA:     He is on atorvastatin since his MI prescribed by cardiologist   Lab Results  Component Value Date   CHOL 108 05/09/2018   HDL 35.30 (L) 05/09/2018   LDLCALC 56 05/09/2018   TRIG 83.0 05/09/2018   CHOLHDL 3 05/09/2018   Lab Results  Component Value Date   ALT 21 08/07/2018        Examination:   There were no vitals taken for this visit.  There is no height or weight on file to calculate BMI.      Assesment/PLAN:   1. Diabetes type 2 with obesity treated with basal bolus insulin  See history of present illness for detailed discussion of current diabetes management, blood sugar patterns and problems identifie  A1c pending, previously 6.8  With his freestyle libre his blood sugars appear to be excellent without much fluctuation but only readings for the last 3 days are available He does tend to have occasional low normal readings fasting and also reports blood sugar may be lower sometimes with walking  Since her blood sugars in the mornings are otherwise fairly close to normal and as low as 63 he will reduce his Lantus to 24 instead of 26 If he is planning to walk after breakfast or lunch he will reduce the NovoLog by 2 units before the meal Also recommend that if he is walking in between meals he will need to have a snack before he goes out  We will try to come in for download of his meter and also to verify that it is accurate Discussed blood sugar targets at various times He will have his A1c checked with his PCP at his  upcoming visit and labs to be forwarded for review  2.   Chronic kidney disease: His creatinine needs to be reassessed, now to be followed by nephrologist Encouraged him to check his blood pressure periodically at home    There are no Patient Instructions on file for this visit.   Total duration of telephone encounter =12 minutes   Elayne Snare  11/27/2018, 4:06 PM

## 2018-11-27 ENCOUNTER — Encounter: Payer: Self-pay | Admitting: Endocrinology

## 2018-11-27 ENCOUNTER — Other Ambulatory Visit: Payer: Self-pay

## 2018-11-27 ENCOUNTER — Ambulatory Visit (INDEPENDENT_AMBULATORY_CARE_PROVIDER_SITE_OTHER): Payer: Medicare PPO | Admitting: Endocrinology

## 2018-11-27 DIAGNOSIS — E1165 Type 2 diabetes mellitus with hyperglycemia: Secondary | ICD-10-CM

## 2018-11-27 DIAGNOSIS — N184 Chronic kidney disease, stage 4 (severe): Secondary | ICD-10-CM | POA: Diagnosis not present

## 2018-11-27 DIAGNOSIS — Z794 Long term (current) use of insulin: Secondary | ICD-10-CM | POA: Diagnosis not present

## 2018-11-28 ENCOUNTER — Telehealth (HOSPITAL_COMMUNITY): Payer: Self-pay | Admitting: *Deleted

## 2018-11-28 DIAGNOSIS — J441 Chronic obstructive pulmonary disease with (acute) exacerbation: Secondary | ICD-10-CM | POA: Diagnosis not present

## 2018-11-28 DIAGNOSIS — N183 Chronic kidney disease, stage 3 (moderate): Secondary | ICD-10-CM | POA: Diagnosis not present

## 2018-11-28 DIAGNOSIS — I129 Hypertensive chronic kidney disease with stage 1 through stage 4 chronic kidney disease, or unspecified chronic kidney disease: Secondary | ICD-10-CM | POA: Diagnosis not present

## 2018-11-28 DIAGNOSIS — D631 Anemia in chronic kidney disease: Secondary | ICD-10-CM | POA: Diagnosis not present

## 2018-11-28 DIAGNOSIS — E1122 Type 2 diabetes mellitus with diabetic chronic kidney disease: Secondary | ICD-10-CM | POA: Diagnosis not present

## 2018-11-28 DIAGNOSIS — I2510 Atherosclerotic heart disease of native coronary artery without angina pectoris: Secondary | ICD-10-CM | POA: Diagnosis not present

## 2018-11-28 DIAGNOSIS — M160 Bilateral primary osteoarthritis of hip: Secondary | ICD-10-CM | POA: Diagnosis not present

## 2018-11-28 DIAGNOSIS — M19071 Primary osteoarthritis, right ankle and foot: Secondary | ICD-10-CM | POA: Diagnosis not present

## 2018-11-28 DIAGNOSIS — M170 Bilateral primary osteoarthritis of knee: Secondary | ICD-10-CM | POA: Diagnosis not present

## 2018-11-28 NOTE — Telephone Encounter (Signed)
Spoke with pt.  Pt is completed his initial evaluation for home physical therapy.  Pt will continue to work on strengthening and conditioning. Will close this referral for cardiac rehab. Cherre Huger, BSN Cardiac and Training and development officer

## 2018-12-01 DIAGNOSIS — I2510 Atherosclerotic heart disease of native coronary artery without angina pectoris: Secondary | ICD-10-CM | POA: Diagnosis not present

## 2018-12-01 DIAGNOSIS — M160 Bilateral primary osteoarthritis of hip: Secondary | ICD-10-CM | POA: Diagnosis not present

## 2018-12-01 DIAGNOSIS — D631 Anemia in chronic kidney disease: Secondary | ICD-10-CM | POA: Diagnosis not present

## 2018-12-01 DIAGNOSIS — M170 Bilateral primary osteoarthritis of knee: Secondary | ICD-10-CM | POA: Diagnosis not present

## 2018-12-01 DIAGNOSIS — M19071 Primary osteoarthritis, right ankle and foot: Secondary | ICD-10-CM | POA: Diagnosis not present

## 2018-12-01 DIAGNOSIS — E1122 Type 2 diabetes mellitus with diabetic chronic kidney disease: Secondary | ICD-10-CM | POA: Diagnosis not present

## 2018-12-01 DIAGNOSIS — J441 Chronic obstructive pulmonary disease with (acute) exacerbation: Secondary | ICD-10-CM | POA: Diagnosis not present

## 2018-12-01 DIAGNOSIS — N183 Chronic kidney disease, stage 3 (moderate): Secondary | ICD-10-CM | POA: Diagnosis not present

## 2018-12-01 DIAGNOSIS — I129 Hypertensive chronic kidney disease with stage 1 through stage 4 chronic kidney disease, or unspecified chronic kidney disease: Secondary | ICD-10-CM | POA: Diagnosis not present

## 2018-12-02 DIAGNOSIS — N183 Chronic kidney disease, stage 3 (moderate): Secondary | ICD-10-CM | POA: Diagnosis not present

## 2018-12-02 DIAGNOSIS — D649 Anemia, unspecified: Secondary | ICD-10-CM | POA: Diagnosis not present

## 2018-12-02 DIAGNOSIS — Z794 Long term (current) use of insulin: Secondary | ICD-10-CM | POA: Diagnosis not present

## 2018-12-02 DIAGNOSIS — I2510 Atherosclerotic heart disease of native coronary artery without angina pectoris: Secondary | ICD-10-CM | POA: Diagnosis not present

## 2018-12-02 DIAGNOSIS — E7800 Pure hypercholesterolemia, unspecified: Secondary | ICD-10-CM | POA: Diagnosis not present

## 2018-12-02 DIAGNOSIS — J449 Chronic obstructive pulmonary disease, unspecified: Secondary | ICD-10-CM | POA: Diagnosis not present

## 2018-12-02 DIAGNOSIS — I10 Essential (primary) hypertension: Secondary | ICD-10-CM | POA: Diagnosis not present

## 2018-12-02 DIAGNOSIS — E1121 Type 2 diabetes mellitus with diabetic nephropathy: Secondary | ICD-10-CM | POA: Diagnosis not present

## 2018-12-02 LAB — HEMOGLOBIN A1C: Hemoglobin A1C: 6.7

## 2018-12-03 DIAGNOSIS — D631 Anemia in chronic kidney disease: Secondary | ICD-10-CM | POA: Diagnosis not present

## 2018-12-03 DIAGNOSIS — M19071 Primary osteoarthritis, right ankle and foot: Secondary | ICD-10-CM | POA: Diagnosis not present

## 2018-12-03 DIAGNOSIS — J441 Chronic obstructive pulmonary disease with (acute) exacerbation: Secondary | ICD-10-CM | POA: Diagnosis not present

## 2018-12-03 DIAGNOSIS — N183 Chronic kidney disease, stage 3 (moderate): Secondary | ICD-10-CM | POA: Diagnosis not present

## 2018-12-03 DIAGNOSIS — M160 Bilateral primary osteoarthritis of hip: Secondary | ICD-10-CM | POA: Diagnosis not present

## 2018-12-03 DIAGNOSIS — I129 Hypertensive chronic kidney disease with stage 1 through stage 4 chronic kidney disease, or unspecified chronic kidney disease: Secondary | ICD-10-CM | POA: Diagnosis not present

## 2018-12-03 DIAGNOSIS — E1122 Type 2 diabetes mellitus with diabetic chronic kidney disease: Secondary | ICD-10-CM | POA: Diagnosis not present

## 2018-12-03 DIAGNOSIS — M170 Bilateral primary osteoarthritis of knee: Secondary | ICD-10-CM | POA: Diagnosis not present

## 2018-12-03 DIAGNOSIS — I2510 Atherosclerotic heart disease of native coronary artery without angina pectoris: Secondary | ICD-10-CM | POA: Diagnosis not present

## 2018-12-05 ENCOUNTER — Telehealth: Payer: Self-pay | Admitting: Endocrinology

## 2018-12-05 NOTE — Telephone Encounter (Signed)
Called pt and advised there are no results found in Epic nor results found via fax. Advised pt to call their office to send results. Verbalized acceptance and understanding.

## 2018-12-05 NOTE — Telephone Encounter (Signed)
Patient had his A1C done at [facility] on [address] and wants to make sure that Dr. Dwyane Dee has received the A1C results. Please call patient at ph# [phone] to advise.

## 2018-12-08 DIAGNOSIS — M170 Bilateral primary osteoarthritis of knee: Secondary | ICD-10-CM | POA: Diagnosis not present

## 2018-12-08 DIAGNOSIS — J441 Chronic obstructive pulmonary disease with (acute) exacerbation: Secondary | ICD-10-CM | POA: Diagnosis not present

## 2018-12-08 DIAGNOSIS — N183 Chronic kidney disease, stage 3 (moderate): Secondary | ICD-10-CM | POA: Diagnosis not present

## 2018-12-08 DIAGNOSIS — M160 Bilateral primary osteoarthritis of hip: Secondary | ICD-10-CM | POA: Diagnosis not present

## 2018-12-08 DIAGNOSIS — I129 Hypertensive chronic kidney disease with stage 1 through stage 4 chronic kidney disease, or unspecified chronic kidney disease: Secondary | ICD-10-CM | POA: Diagnosis not present

## 2018-12-08 DIAGNOSIS — E1122 Type 2 diabetes mellitus with diabetic chronic kidney disease: Secondary | ICD-10-CM | POA: Diagnosis not present

## 2018-12-08 DIAGNOSIS — D631 Anemia in chronic kidney disease: Secondary | ICD-10-CM | POA: Diagnosis not present

## 2018-12-08 DIAGNOSIS — M19071 Primary osteoarthritis, right ankle and foot: Secondary | ICD-10-CM | POA: Diagnosis not present

## 2018-12-08 DIAGNOSIS — I2510 Atherosclerotic heart disease of native coronary artery without angina pectoris: Secondary | ICD-10-CM | POA: Diagnosis not present

## 2018-12-15 ENCOUNTER — Telehealth: Payer: Self-pay | Admitting: Endocrinology

## 2018-12-16 NOTE — Telephone Encounter (Signed)
Had questions about whether we had received his labs from another docotor

## 2018-12-18 DIAGNOSIS — J441 Chronic obstructive pulmonary disease with (acute) exacerbation: Secondary | ICD-10-CM | POA: Diagnosis not present

## 2018-12-18 DIAGNOSIS — M19071 Primary osteoarthritis, right ankle and foot: Secondary | ICD-10-CM | POA: Diagnosis not present

## 2018-12-18 DIAGNOSIS — M160 Bilateral primary osteoarthritis of hip: Secondary | ICD-10-CM | POA: Diagnosis not present

## 2018-12-18 DIAGNOSIS — I2510 Atherosclerotic heart disease of native coronary artery without angina pectoris: Secondary | ICD-10-CM | POA: Diagnosis not present

## 2018-12-18 DIAGNOSIS — N183 Chronic kidney disease, stage 3 (moderate): Secondary | ICD-10-CM | POA: Diagnosis not present

## 2018-12-18 DIAGNOSIS — M170 Bilateral primary osteoarthritis of knee: Secondary | ICD-10-CM | POA: Diagnosis not present

## 2018-12-18 DIAGNOSIS — I129 Hypertensive chronic kidney disease with stage 1 through stage 4 chronic kidney disease, or unspecified chronic kidney disease: Secondary | ICD-10-CM | POA: Diagnosis not present

## 2018-12-18 DIAGNOSIS — E1122 Type 2 diabetes mellitus with diabetic chronic kidney disease: Secondary | ICD-10-CM | POA: Diagnosis not present

## 2018-12-18 DIAGNOSIS — D631 Anemia in chronic kidney disease: Secondary | ICD-10-CM | POA: Diagnosis not present

## 2018-12-23 DIAGNOSIS — R799 Abnormal finding of blood chemistry, unspecified: Secondary | ICD-10-CM | POA: Diagnosis not present

## 2018-12-23 DIAGNOSIS — D649 Anemia, unspecified: Secondary | ICD-10-CM | POA: Diagnosis not present

## 2018-12-25 DIAGNOSIS — M19071 Primary osteoarthritis, right ankle and foot: Secondary | ICD-10-CM | POA: Diagnosis not present

## 2018-12-25 DIAGNOSIS — N183 Chronic kidney disease, stage 3 (moderate): Secondary | ICD-10-CM | POA: Diagnosis not present

## 2018-12-25 DIAGNOSIS — J441 Chronic obstructive pulmonary disease with (acute) exacerbation: Secondary | ICD-10-CM | POA: Diagnosis not present

## 2018-12-25 DIAGNOSIS — I129 Hypertensive chronic kidney disease with stage 1 through stage 4 chronic kidney disease, or unspecified chronic kidney disease: Secondary | ICD-10-CM | POA: Diagnosis not present

## 2018-12-25 DIAGNOSIS — E1122 Type 2 diabetes mellitus with diabetic chronic kidney disease: Secondary | ICD-10-CM | POA: Diagnosis not present

## 2018-12-25 DIAGNOSIS — M170 Bilateral primary osteoarthritis of knee: Secondary | ICD-10-CM | POA: Diagnosis not present

## 2018-12-25 DIAGNOSIS — M160 Bilateral primary osteoarthritis of hip: Secondary | ICD-10-CM | POA: Diagnosis not present

## 2018-12-25 DIAGNOSIS — D631 Anemia in chronic kidney disease: Secondary | ICD-10-CM | POA: Diagnosis not present

## 2018-12-25 DIAGNOSIS — I2510 Atherosclerotic heart disease of native coronary artery without angina pectoris: Secondary | ICD-10-CM | POA: Diagnosis not present

## 2018-12-30 DIAGNOSIS — I2510 Atherosclerotic heart disease of native coronary artery without angina pectoris: Secondary | ICD-10-CM | POA: Diagnosis not present

## 2018-12-30 DIAGNOSIS — M160 Bilateral primary osteoarthritis of hip: Secondary | ICD-10-CM | POA: Diagnosis not present

## 2018-12-30 DIAGNOSIS — I129 Hypertensive chronic kidney disease with stage 1 through stage 4 chronic kidney disease, or unspecified chronic kidney disease: Secondary | ICD-10-CM | POA: Diagnosis not present

## 2018-12-30 DIAGNOSIS — M19071 Primary osteoarthritis, right ankle and foot: Secondary | ICD-10-CM | POA: Diagnosis not present

## 2018-12-30 DIAGNOSIS — M170 Bilateral primary osteoarthritis of knee: Secondary | ICD-10-CM | POA: Diagnosis not present

## 2018-12-30 DIAGNOSIS — N183 Chronic kidney disease, stage 3 (moderate): Secondary | ICD-10-CM | POA: Diagnosis not present

## 2018-12-30 DIAGNOSIS — J441 Chronic obstructive pulmonary disease with (acute) exacerbation: Secondary | ICD-10-CM | POA: Diagnosis not present

## 2018-12-30 DIAGNOSIS — E1122 Type 2 diabetes mellitus with diabetic chronic kidney disease: Secondary | ICD-10-CM | POA: Diagnosis not present

## 2018-12-30 DIAGNOSIS — D631 Anemia in chronic kidney disease: Secondary | ICD-10-CM | POA: Diagnosis not present

## 2019-01-02 DIAGNOSIS — N3941 Urge incontinence: Secondary | ICD-10-CM | POA: Diagnosis not present

## 2019-01-02 DIAGNOSIS — R350 Frequency of micturition: Secondary | ICD-10-CM | POA: Diagnosis not present

## 2019-01-06 DIAGNOSIS — M160 Bilateral primary osteoarthritis of hip: Secondary | ICD-10-CM | POA: Diagnosis not present

## 2019-01-06 DIAGNOSIS — D631 Anemia in chronic kidney disease: Secondary | ICD-10-CM | POA: Diagnosis not present

## 2019-01-06 DIAGNOSIS — M19071 Primary osteoarthritis, right ankle and foot: Secondary | ICD-10-CM | POA: Diagnosis not present

## 2019-01-06 DIAGNOSIS — N183 Chronic kidney disease, stage 3 (moderate): Secondary | ICD-10-CM | POA: Diagnosis not present

## 2019-01-06 DIAGNOSIS — E1122 Type 2 diabetes mellitus with diabetic chronic kidney disease: Secondary | ICD-10-CM | POA: Diagnosis not present

## 2019-01-06 DIAGNOSIS — J441 Chronic obstructive pulmonary disease with (acute) exacerbation: Secondary | ICD-10-CM | POA: Diagnosis not present

## 2019-01-06 DIAGNOSIS — M170 Bilateral primary osteoarthritis of knee: Secondary | ICD-10-CM | POA: Diagnosis not present

## 2019-01-06 DIAGNOSIS — I2510 Atherosclerotic heart disease of native coronary artery without angina pectoris: Secondary | ICD-10-CM | POA: Diagnosis not present

## 2019-01-06 DIAGNOSIS — I129 Hypertensive chronic kidney disease with stage 1 through stage 4 chronic kidney disease, or unspecified chronic kidney disease: Secondary | ICD-10-CM | POA: Diagnosis not present

## 2019-01-08 DIAGNOSIS — G4733 Obstructive sleep apnea (adult) (pediatric): Secondary | ICD-10-CM | POA: Diagnosis not present

## 2019-01-08 DIAGNOSIS — I10 Essential (primary) hypertension: Secondary | ICD-10-CM | POA: Diagnosis not present

## 2019-01-12 ENCOUNTER — Telehealth: Payer: Self-pay | Admitting: Endocrinology

## 2019-01-12 ENCOUNTER — Other Ambulatory Visit: Payer: Self-pay

## 2019-01-12 MED ORDER — FREESTYLE LIBRE 14 DAY SENSOR MISC: 1.0000 | 3 refills | Status: DC

## 2019-01-12 NOTE — Telephone Encounter (Signed)
Pt needs 90 day supply of Freestyle libre from [facility]   Pt has called them to send in the request and is calling us as well.  Pt has started on his last one today.

## 2019-01-12 NOTE — Telephone Encounter (Signed)
Rx sent 

## 2019-01-19 ENCOUNTER — Other Ambulatory Visit: Payer: Self-pay | Admitting: Endocrinology

## 2019-01-21 DIAGNOSIS — L570 Actinic keratosis: Secondary | ICD-10-CM | POA: Diagnosis not present

## 2019-01-21 DIAGNOSIS — D0472 Carcinoma in situ of skin of left lower limb, including hip: Secondary | ICD-10-CM | POA: Diagnosis not present

## 2019-01-21 DIAGNOSIS — D225 Melanocytic nevi of trunk: Secondary | ICD-10-CM | POA: Diagnosis not present

## 2019-01-21 DIAGNOSIS — L821 Other seborrheic keratosis: Secondary | ICD-10-CM | POA: Diagnosis not present

## 2019-01-21 DIAGNOSIS — X32XXXD Exposure to sunlight, subsequent encounter: Secondary | ICD-10-CM | POA: Diagnosis not present

## 2019-01-26 DIAGNOSIS — R911 Solitary pulmonary nodule: Secondary | ICD-10-CM | POA: Diagnosis not present

## 2019-01-26 DIAGNOSIS — G4733 Obstructive sleep apnea (adult) (pediatric): Secondary | ICD-10-CM | POA: Diagnosis not present

## 2019-01-26 DIAGNOSIS — Z720 Tobacco use: Secondary | ICD-10-CM | POA: Diagnosis not present

## 2019-01-26 DIAGNOSIS — J479 Bronchiectasis, uncomplicated: Secondary | ICD-10-CM | POA: Diagnosis not present

## 2019-01-26 DIAGNOSIS — J441 Chronic obstructive pulmonary disease with (acute) exacerbation: Secondary | ICD-10-CM | POA: Diagnosis not present

## 2019-01-27 ENCOUNTER — Other Ambulatory Visit: Payer: Self-pay | Admitting: Endocrinology

## 2019-02-02 ENCOUNTER — Other Ambulatory Visit: Payer: Self-pay | Admitting: Endocrinology

## 2019-03-04 ENCOUNTER — Other Ambulatory Visit: Payer: Self-pay | Admitting: Endocrinology

## 2019-03-05 ENCOUNTER — Ambulatory Visit (INDEPENDENT_AMBULATORY_CARE_PROVIDER_SITE_OTHER): Payer: Medicare PPO | Admitting: Endocrinology

## 2019-03-05 ENCOUNTER — Other Ambulatory Visit: Payer: Self-pay

## 2019-03-05 ENCOUNTER — Encounter: Payer: Self-pay | Admitting: Endocrinology

## 2019-03-05 VITALS — BP 130/50 | HR 40 | Ht 70.0 in | Wt 188.8 lb

## 2019-03-05 DIAGNOSIS — Z0001 Encounter for general adult medical examination with abnormal findings: Secondary | ICD-10-CM | POA: Diagnosis not present

## 2019-03-05 DIAGNOSIS — H35371 Puckering of macula, right eye: Secondary | ICD-10-CM | POA: Diagnosis not present

## 2019-03-05 DIAGNOSIS — I10 Essential (primary) hypertension: Secondary | ICD-10-CM | POA: Diagnosis not present

## 2019-03-05 DIAGNOSIS — E1121 Type 2 diabetes mellitus with diabetic nephropathy: Secondary | ICD-10-CM | POA: Diagnosis not present

## 2019-03-05 DIAGNOSIS — Z961 Presence of intraocular lens: Secondary | ICD-10-CM | POA: Diagnosis not present

## 2019-03-05 DIAGNOSIS — Z23 Encounter for immunization: Secondary | ICD-10-CM | POA: Diagnosis not present

## 2019-03-05 DIAGNOSIS — Z794 Long term (current) use of insulin: Secondary | ICD-10-CM

## 2019-03-05 DIAGNOSIS — H52203 Unspecified astigmatism, bilateral: Secondary | ICD-10-CM | POA: Diagnosis not present

## 2019-03-05 DIAGNOSIS — E1165 Type 2 diabetes mellitus with hyperglycemia: Secondary | ICD-10-CM | POA: Diagnosis not present

## 2019-03-05 DIAGNOSIS — N184 Chronic kidney disease, stage 4 (severe): Secondary | ICD-10-CM

## 2019-03-05 DIAGNOSIS — E7800 Pure hypercholesterolemia, unspecified: Secondary | ICD-10-CM | POA: Diagnosis not present

## 2019-03-05 DIAGNOSIS — N183 Chronic kidney disease, stage 3 (moderate): Secondary | ICD-10-CM | POA: Diagnosis not present

## 2019-03-05 DIAGNOSIS — D631 Anemia in chronic kidney disease: Secondary | ICD-10-CM | POA: Diagnosis not present

## 2019-03-05 DIAGNOSIS — E113291 Type 2 diabetes mellitus with mild nonproliferative diabetic retinopathy without macular edema, right eye: Secondary | ICD-10-CM | POA: Diagnosis not present

## 2019-03-05 LAB — GLUCOSE, POCT (MANUAL RESULT ENTRY): POC Glucose: 165 mg/dl — AB (ref 70–99)

## 2019-03-05 NOTE — Progress Notes (Signed)
Patient ID: Clayton [name], male   DOB: Mar 08, 1928, 85 y.o.   MRN: [id number]     Reason for Appointment: Followup of diabetes and various issues  History of Present Illness   Type 2 DIABETES MELITUS diagnosed 1989  He has had long-standing diabetes and has been on basal bolus insulin for the last few years Since 2014 he has required larger doses of insulin especially with having to take periodic courses of steroids for his pulmonary problems Also his Actos was stopped because of tendency to edema and metformin stopped because of renal dysfunction  Insulin regimen: Novolog 7-6-9 units before meals, Lantus 24 units in a.m., using syringes  A1c is last at 6.7 and was drawn by PCP today    Current blood sugar patterns and problems identified:  His blood sugars are now being monitored with the freestyle libre that he started in 5/20 after instructions from nurse educator  Not checking fingersticks  However today in the office his freestyle Elenor Legato appears to be reading falsely low  He does feel symptoms of low blood sugars however and has had periodic low blood sugars as low as 46  However review of his insulin regimen which has been marked on his freestyle Elenor Legato indicates that he is mostly having low blood sugars following mealtime insulin taken postprandially  Occasionally may take postprandial correction doses evening blood sugar is only 166  He now says that he will not take his NovoLog before eating frequently and will only wait till it goes up  Last night blood sugar was 60 during the night although he did not treat this and had no symptoms  FASTING blood sugars at breakfast are not low, has had reduced doses of Lantus previously  He says he still tries to walk up to 7/10 of a mile although slowly  His weight is about the same although overall less than in the past     Oral hypoglycemic drugs: None           Monitors blood glucose:  3-4 times a day    Glucometer: Freestyle libre   CONTINUOUS GLUCOSE MONITORING RECORD INTERPRETATION    Dates of Recording: 9/4 through 9/17  Sensor description: [employer]  Results statistics:   CGM use % of time  99  Average and SD 118  Time in range     83   %  % Time Above 180  7  % Time above 250   % Time Below target  10    Glycemic patterns summary: His blood sugar today in the office was 165 compared to his freestyle libre reading of 145 at the same time He has postprandial hyperglycemia most significantly after lunch although absolute readings are still reasonably good with highest average 155 Has tendency to periodic low sugars after midnight, before noon and around 6 PM  Hyperglycemic episodes are occurring with meals to a variable extent and more significantly going up after lunch  Hypoglycemic episodes occurred as above with blood sugars as low as 46  Overnight periods: Blood sugars are low normal to low on his sensor readings between about 11:30 PM and 3 AM  Preprandial periods: Blood sugars are excellent before meals with averages below  Postprandial periods:   After breakfast: He has variable rise in his blood sugar depending on his carbohydrate intake but usually not significant  After lunch:   Glucose does tend to rise at least 40 to  50 mg on an average  After dinner: Blood sugars are more fluctuating and occasionally going up over 180   PRE-MEAL Fasting Lunch Dinner  2-4 AM Overall  Glucose range:       Mean/median:  103    88  118   POST-MEAL PC Breakfast PC Lunch PC Dinner  Glucose range:     Mean/median:  132  150  155   PREVIOUS readings:    PRE-MEAL Fasting Lunch Dinner Bedtime Overall  Glucose range:  63-138  111, 122  94, 109  93, 118   Mean/median:      114   POST-MEAL PC Breakfast PC Lunch PC Dinner  Glucose range:  122  74, 163   Mean/median:        Meals: 3 meals per day.  breakfast: oatmeal / grits, with peanut butter, for lunch he will  have a sandwich with apple and grapefruit   Dinner usually at 6:30 pm        Physical activity: exercise: Walking as above  Weight history:  Wt Readings from Last 3 Encounters:  03/05/19 188 lb 12.8 oz (85.6 kg)  11/11/18 180 lb (81.6 kg)  08/25/18 190 lb 6.4 oz (86.4 kg)   LABS:  Lab Results  Component Value Date   HGBA1C 6.7 12/02/2018   HGBA1C 6.8 (H) 08/07/2018   HGBA1C 6.6 (H) 05/09/2018   Lab Results  Component Value Date   MICROALBUR 5.4 (H) 08/07/2018   LDLCALC 56 05/09/2018   CREATININE 2.50 (H) 08/07/2018   Problem 2:  RENAL insufficiency: He has had chronic increase in creatinine, was 1.7 in 2014 with slow progression . This has been related to nephropathy and glomerulosclerosis Has been evaluated by nephrologist  Renal ultrasound did not show any abnormality  Has been followed by nephrologist, last visit 2/20  No recent labs available:  Lab Results  Component Value Date   CREATININE 2.50 (H) 08/07/2018   CREATININE 2.44 (H) 06/13/2018   CREATININE 2.57 (H) 05/09/2018   CREATININE 2.05 (H) 04/26/2018     He has had a normal PTH, not on calcitriol and only on vitamin D   Lab Results  Component Value Date   PTH 54 07/04/2017   CALCIUM 9.2 08/07/2018   CAION 1.18 04/20/2018   PHOS 2.3 04/30/2014      Allergies as of 03/05/2019      Reactions   Doxycycline Rash   Levaquin [levofloxacin In D5w]    Caused chest pain and heartburn   Nitrous Oxide Other (See Comments)   Reaction:  Unknown    Penicillins Other (See Comments)   Reaction:  Unknown Has patient had a PCN reaction causing immediate rash, facial/tongue/throat swelling, SOB or lightheadedness with hypotension: Unsure Has patient had a PCN reaction causing severe rash involving mucus membranes or skin necrosis: Unsure Has patient had a PCN reaction that required hospitalization Unsure  Has patient had a PCN reaction occurring within the last 10 years: No If all of the above answers  are "NO", then may proceed with Cephalosporin use.   Tape Other (See Comments)   Reaction:  Tears pts skin    Diltiazem Rash      Medication List       Accurate as of March 05, 2019  2:58 PM. If you have any questions, ask your nurse or doctor.        Accu-Chek Softclix Lancets lancets TEST BLOOD SUGAR FOUR TIMES DAILY   albuterol 108 (90 Base) MCG/ACT inhaler  Commonly known as: VENTOLIN HFA Inhale 1-2 puffs into the lungs every 6 (six) hours as needed for wheezing or shortness of breath.   atorvastatin 80 MG tablet Commonly known as: LIPITOR Take 0.5 tablets (40 mg total) by mouth daily.   benzonatate 100 MG capsule Commonly known as: TESSALON Take by mouth 3 (three) times daily as needed for cough.   budesonide-formoterol 160-4.5 MCG/ACT inhaler Commonly known as: Symbicort Inhale 2 puffs into the lungs 2 (two) times daily.   cholecalciferol 1000 units tablet Commonly known as: VITAMIN D Take 2,000 Units by mouth daily.   Droplet Insulin Syringe 31G X 5/16" 0.3 ML Misc Generic drug: Insulin Syringe-Needle U-100 USE THREE TIMES DAILY   finasteride 5 MG tablet Commonly known as: PROSCAR TAKE 1 TABLET EVERY DAY   FreeStyle Libre 14 Day Reader Kerrin Mo 1 each by Does not apply route every 14 (fourteen) days. Use reader to monitor blood sugar continuously with freestyle libre sensor.   FreeStyle Libre 14 Day Sensor Misc 1 each by Does not apply route every 14 (fourteen) days. Apply 1 sensor to body once every 14 days to monitor blood sugars continuously.   furosemide 20 MG tablet Commonly known as: LASIX TAKE 1 TABLET BY MOUTH 5 TIMES WEEKLY(SUNDAY,MONDAY,TUESDAY, THURSDAY,SATURDAY)   glucose blood test strip Use as instructed to test blood sugars 4 times daily   GNP Alcohol Swabs 70 % Pads USE 7 SWABS DAILY AS DIRECTED   insulin aspart 100 UNIT/ML injection Commonly known as: novoLOG TAKE 7 UNITS IN THE MORNING, 6 UNITS AT LUNCH, AND 9 UNITS AT SUPPER.  What changed: additional instructions   insulin glargine 100 UNIT/ML injection Commonly known as: LANTUS Inject 24 Units into the skin daily. INJECT 24 UNITS UNDER THE SKIN EVERY MORNING.   isosorbide mononitrate 30 MG 24 hr tablet Commonly known as: IMDUR Take 1 tablet (30 mg total) by mouth daily.   levofloxacin 500 MG tablet Commonly known as: LEVAQUIN Take 500 mg by mouth daily.   Melatonin 5 MG Tabs Take 2 tablets by mouth at bedtime.   multivitamin with minerals Tabs tablet Take 1 tablet by mouth daily.   omeprazole 20 MG capsule Commonly known as: PRILOSEC Take 1 capsule (20 mg total) by mouth daily.   ticagrelor 90 MG Tabs tablet Commonly known as: BRILINTA Take 1 tablet (90 mg total) by mouth 2 (two) times daily.   Toviaz 4 MG Tb24 tablet Generic drug: fesoterodine Take 4 mg by mouth daily.       Allergies:  Allergies  Allergen Reactions  . Doxycycline Rash  . Levaquin [Levofloxacin In D5w]     Caused chest pain and heartburn  . Nitrous Oxide Other (See Comments)    Reaction:  Unknown   . Penicillins Other (See Comments)    Reaction:  Unknown Has patient had a PCN reaction causing immediate rash, facial/tongue/throat swelling, SOB or lightheadedness with hypotension: Unsure Has patient had a PCN reaction causing severe rash involving mucus membranes or skin necrosis: Unsure Has patient had a PCN reaction that required hospitalization Unsure  Has patient had a PCN reaction occurring within the last 10 years: No If all of the above answers are "NO", then may proceed with Cephalosporin use.  . Tape Other (See Comments)    Reaction:  Tears pts skin   . Diltiazem Rash    Past Medical History:  Diagnosis Date  . Anemia   . BPH (benign prostatic hyperplasia)   . CAD (coronary artery disease)  hx of stemi  04-2018   . Chronic renal failure   . Chronic renal insufficiency   . COPD (chronic obstructive pulmonary disease) ([place])   . DDD (degenerative  disc disease), lumbar   . Diabetes mellitus   . Hyperlipidemia   . Hyperparathyroidism ([place])   . Hypertension   . IDA (iron deficiency anemia)   . Neuromuscular disorder ([place])   . OSA on CPAP   . Osteomyelitis of forearm ([place])   . Osteoporosis     Past Surgical History:  Procedure Laterality Date  . CORONARY STENT INTERVENTION N/A 04/20/2018   Procedure: CORONARY STENT INTERVENTION;  Surgeon: Leonie Man, MD;  Location: [facility] INVASIVE CV LAB;;;    . Rem[facility] ACUTE MI REVASCULARIZATION N/A 04/20/2018   Procedure: Coronary/Graft Acute MI Revascularization;  Surgeon: Leonie Man, MD;  Location: [place] CV LAB;  Service: Cardiovascular;  Laterality: N/A; --> after initial PTCA restoring flow down the RCA, there was diffuse proximal to mid disease treated with a DES SYNERGY 3 X 38 ( 3.41mm)  . EYE SURGERY    . HOLTER MONITOR  05/2018   Sinus rhythm noted with sinus bradycardia.  Also sinus rhythm with 2-1 AV block noted.  Maximum heart rate was sinus tachycardia 123 bpm.  Rare PVCs accelerated idioventricular rhythm noted.  Intermittent Wenckebach block noted.  . I&D EXTREMITY Right 08/13/2016   Procedure: IRRIGATION AND DEBRIDEMENT RIGHT ELBOW AND HAND;  Surgeon: Milly Jakob, MD;  Location: [facility] ORS;  Service: Orthopedics;  Laterality: Right;  . LEFT HEART CATH AND CORONARY ANGIOGRAPHY N/A 04/20/2018   Procedure: LEFT HEART CATH AND CORONARY ANGIOGRAPHY;  Surgeon: Leonie Man, MD;  Location: [place] CV LAB; mRCA 100%&80% (after 55% tapering in pRCA), mLM-ostLAD 40% w/ 85% ostCx, ost-pCx 65% @ Om1 w/ ost 85%. pLAD 60% & 50% after D1 with distal 75%, ostD1 65% & 90% after small side branch.    . TRANSTHORACIC ECHOCARDIOGRAM  04/21/2018   EF 50-55%. Posterior Wall HK. Severe RV HK, dilated IVC. Temp wire in place  . VASECTOMY    . VIDEO BRONCHOSCOPY Bilateral 08/25/2012   Procedure: VIDEO BRONCHOSCOPY WITHOUT FLUORO;  Surgeon: Brand Males, MD;  Location: [facility];   Service: Cardiopulmonary;  Laterality: Bilateral;    Family History  Problem Relation Age of Onset  . Diabetes Mellitus II Other   . Hypertension Other   . Other Father        complications from strep  . CVA Father   . Cancer Sister   . Other Brother        hip replacement complications     Social History:  reports that he quit smoking about 45 years ago. His smoking use included cigarettes. He has a 99.00 pack-year smoking history. He quit smokeless tobacco use about 43 years ago. He reports that he does not drink alcohol or use drugs.  Review of Systems  BLOOD PRESSURE:  He is not on any treatment for this and blood pressure is consistently normal    BP Readings from Last 3 Encounters:  03/05/19 (!) 130/50  11/11/18 (!) 112/59  08/25/18 120/76   Taking Lasix daily for edema   ANEMIA:   He has a  history of anemia related to renal dysfunction and mild iron deficiency  Has been treated with iron infusions, no recent information available   Lab Results  Component Value Date   WBC 12.7 (H) 08/07/2018   HGB 9.7 (L) 08/07/2018   HCT 29.5 (L)  08/07/2018   MCV 96.1 08/07/2018   PLT 413.0 (H) 08/07/2018   Lab Results  Component Value Date   IRON 21 (L) 04/23/2018   TIBC 230 (L) 04/23/2018   FERRITIN 66.2 08/07/2018    He has had sleep apnea, using CPAP and this is being adjusted by pulmonologist.    He has had chronic lower urinary tract problems and BPH followed by urologist   HYPERLIPIDEMIA:     He is on atorvastatin since his MI prescribed by cardiologist   Lab Results  Component Value Date   CHOL 108 05/09/2018   HDL 35.30 (L) 05/09/2018   LDLCALC 56 05/09/2018   TRIG 83.0 05/09/2018   CHOLHDL 3 05/09/2018   Lab Results  Component Value Date   ALT 21 08/07/2018        Examination:   BP (!) 130/50 (BP Location: Left Arm, Patient Position: Sitting, Cuff Size: Normal)   Pulse (!) 40   Ht 5\' 10"  (1.778 m)   Wt 188 lb 12.8 oz (85.6 kg)   SpO2  98%   BMI 27.09 kg/m   Body mass index is 27.09 kg/m.      Assesment/PLAN:   1. Diabetes type 2 with obesity treated with basal bolus insulin  See history of present illness for detailed discussion of current diabetes management, blood sugar patterns and problems identifie  A1c pending from PCP, previously 6.8  With his freestyle libre his blood sugars may be falsely low However considering his age and medical conditions at the same time he still has excellent range Since his freestyle Elenor Legato is reading falsely low not clear if he is truly having hypoglycemia However discussed in detail the use of mealtime insulin and the fact that he is taking insulin inappropriately postprandially and not proactively before he starts to eat Also is causing some hypoglycemia from taking postprandial insulin especially correction doses Occasionally may have low sugars on his freestyle libre without having taken NovoLog within 4 hours and likely needs a little less Lantus  We will reduce the Lantus dose to 22 NovoLog to be taken only before the meal based on meal size If blood sugar may be on the low side he can take the NovoLog within 15 minutes of the meal Also need to let us know if he continues to have low sugars  Detailed instructions given for mealtime insulin, prevention of hypoglycemia with exercise and blood sugar targets Also discussed needing to check fingersticks periodically to compare with freestyle libre sensors appear to be reading falsely low  2.   Chronic kidney disease: His creatinine will be followed by nephrologist along with monitoring for his anemia and hypertension   Patient Instructions  REDUCE Lantus to 22 units every morning  NOVOLOG: Must take this before eating and not after Advised to take her insulin with you to the dining room to see what you are planning to eat Only skip the insulin if you are not eating any carbohydrate at all such as with salads or low  carbohydrate soups.  Take 5 to 7 units of NovoLog for your meals based on the amount of carbohydrates, may take up to 9 units if eating dessert  Must take the NovoLog within 15 minutes of starting the meal  Call if having low blood sugars  Do not take any extra NovoLog after the meal unless blood sugar is over at least 200, this will tend to cause low sugars later  Have a carbohydrate snack or sugar drink  before going for walks if your blood sugar is below 140    Counseling time on subjects discussed in assessment and plan sections is over 50% of today's 25 minute visit    Elayne Snare  03/05/2019, 2:58 PM

## 2019-03-05 NOTE — Patient Instructions (Signed)
REDUCE Lantus to 22 units every morning  NOVOLOG: Must take this before eating and not after Advised to take her insulin with you to the dining room to see what you are planning to eat Only skip the insulin if you are not eating any carbohydrate at all such as with salads or low carbohydrate soups.  Take 5 to 7 units of NovoLog for your meals based on the amount of carbohydrates, may take up to 9 units if eating dessert  Must take the NovoLog within 15 minutes of starting the meal  Call if having low blood sugars  Do not take any extra NovoLog after the meal unless blood sugar is over at least 200, this will tend to cause low sugars later  Have a carbohydrate snack or sugar drink before going for walks if your blood sugar is below 140

## 2019-04-06 ENCOUNTER — Other Ambulatory Visit: Payer: Self-pay | Admitting: Endocrinology

## 2019-04-17 ENCOUNTER — Other Ambulatory Visit: Payer: Self-pay | Admitting: Physician Assistant

## 2019-04-23 ENCOUNTER — Other Ambulatory Visit: Payer: Self-pay | Admitting: Endocrinology

## 2019-04-23 ENCOUNTER — Other Ambulatory Visit: Payer: Self-pay | Admitting: Physician Assistant

## 2019-04-23 NOTE — Telephone Encounter (Signed)
Please review for refill on Brilinta 90 mg. Dr. Allison Quarry last office note on 11/11/2018 mentioned possibly considering a change in the medication.  Please review the note below before refilling Brilinta 90 mg.  "He seems to be overall recovering from his MI followed by GI bleed.  No further bleeding issues.  He will continue on Brilinta alone at 90 mg until November at which time I would consider either reducing to 60 mg twice daily or to Plavix."   FYI: The patient has a recall in for November 2020.

## 2019-04-23 NOTE — Telephone Encounter (Signed)
This is Dr. Harding's pt. °

## 2019-04-24 DIAGNOSIS — X32XXXD Exposure to sunlight, subsequent encounter: Secondary | ICD-10-CM | POA: Diagnosis not present

## 2019-04-24 DIAGNOSIS — Z85828 Personal history of other malignant neoplasm of skin: Secondary | ICD-10-CM | POA: Diagnosis not present

## 2019-04-24 DIAGNOSIS — D0439 Carcinoma in situ of skin of other parts of face: Secondary | ICD-10-CM | POA: Diagnosis not present

## 2019-04-24 DIAGNOSIS — L570 Actinic keratosis: Secondary | ICD-10-CM | POA: Diagnosis not present

## 2019-04-24 DIAGNOSIS — Z08 Encounter for follow-up examination after completed treatment for malignant neoplasm: Secondary | ICD-10-CM | POA: Diagnosis not present

## 2019-04-24 DIAGNOSIS — C44311 Basal cell carcinoma of skin of nose: Secondary | ICD-10-CM | POA: Diagnosis not present

## 2019-04-27 NOTE — Telephone Encounter (Signed)
Patient is calling to check on status of this prescription request.

## 2019-05-01 NOTE — Telephone Encounter (Signed)
SPOKE TO PATIENT AND DAUGHTER  APPOINTMENT (05/21/19) SCHEDULE FOR PATIENT TO DISCUSS MEDICATION CHANGES REGARDING - BRILINTA

## 2019-05-18 ENCOUNTER — Other Ambulatory Visit: Payer: Self-pay | Admitting: Endocrinology

## 2019-05-21 ENCOUNTER — Encounter: Payer: Self-pay | Admitting: Cardiology

## 2019-05-21 ENCOUNTER — Other Ambulatory Visit: Payer: Self-pay

## 2019-05-21 ENCOUNTER — Ambulatory Visit (INDEPENDENT_AMBULATORY_CARE_PROVIDER_SITE_OTHER): Payer: Medicare PPO | Admitting: Cardiology

## 2019-05-21 VITALS — BP 141/69 | HR 83 | Temp 97.3°F | Ht 70.0 in | Wt 190.0 lb

## 2019-05-21 DIAGNOSIS — I25119 Atherosclerotic heart disease of native coronary artery with unspecified angina pectoris: Secondary | ICD-10-CM | POA: Diagnosis not present

## 2019-05-21 DIAGNOSIS — Z955 Presence of coronary angioplasty implant and graft: Secondary | ICD-10-CM | POA: Diagnosis not present

## 2019-05-21 DIAGNOSIS — I10 Essential (primary) hypertension: Secondary | ICD-10-CM | POA: Diagnosis not present

## 2019-05-21 DIAGNOSIS — I4430 Unspecified atrioventricular block: Secondary | ICD-10-CM

## 2019-05-21 DIAGNOSIS — E785 Hyperlipidemia, unspecified: Secondary | ICD-10-CM | POA: Diagnosis not present

## 2019-05-21 DIAGNOSIS — E1169 Type 2 diabetes mellitus with other specified complication: Secondary | ICD-10-CM | POA: Diagnosis not present

## 2019-05-21 MED ORDER — TICAGRELOR 60 MG PO TABS: 60.0000 mg | ORAL_TABLET | Freq: Two times a day (BID) | ORAL | 3 refills | Status: DC

## 2019-05-21 MED ORDER — NITROGLYCERIN 0.4 MG SL SUBL: 0.4000 mg | SUBLINGUAL_TABLET | SUBLINGUAL | 6 refills | Status: AC | PRN

## 2019-05-21 NOTE — Progress Notes (Signed)
Primary Care Provider: Lujean Amel, MD Cardiologist: Glenetta Hew, MD Electrophysiologist:   Clinic Note: Chief Complaint  Patient presents with  . Follow-up    ~6 months  . Coronary Artery Disease    1 episode of chest pain in the last 6 months   HPI:    Clayton [name] is a 85 y.o. male with a PMH notable for inferior STEMI in November 2019 (PCI to RCA) who presents today for 20-month follow-up for CAD.  Clayton [name] was last seen via telemedicine on 11/11/2018-was doing well from cardiac standpoint.  Was walking anywhere from a mile to 1/2 miles a day with his rolling walker.  Lasix dose have been reduced -> taking every other day.  Bruising was much better after stopping aspirin.  No more GI bleeds.  Just about back to full level of energy.  Recent Hospitalizations: None  Reviewed  CV studies:    The following studies were reviewed today: (if available, images/films reviewed: From Epic Chart or Care Everywhere) . None:   Interval History:   Clayton [name] returns here today still doing fairly well.  He is still walking about a mile a day with his walker.  Is a very slow steady pace but he denies any chest pain or pressure associated with it.  No exertional dyspnea.  He had one episode about a month ago when he had some chest discomfort it lasted from about 8 PM till 5 AM.  He called EMS, when they came out to check him out his EKG looked fine and his pain was relieved, and decided not to go to the ER.  He has not had any chest pain since then and is back to doing his routine levels of activity.  He is not having any problems at all with edema, PND or orthopnea.  He is back to taking his Lasix despite every day now. No significant bleeding or bruising.  No GI bleed issues-melena, hematochezia, hematuria or epistaxis.    CV Review of Symptoms (Summary) positive for - edema and This is pretty much well controlled with his Lasix. negative for - chest pain, dyspnea on  exertion, irregular heartbeat, orthopnea, palpitations, paroxysmal nocturnal dyspnea, rapid heart rate, shortness of breath or Syncope/near syncope, TIA/amaurosis fugax; claudication  The patient does not have symptoms concerning for COVID-19 infection (fever, chills, cough, or new shortness of breath).  The patient is practicing social distancing. ++ Masking.  He does not really go out for groceries/shopping.   REVIEWED OF SYSTEMS   A comprehensive ROS was performed. Review of Systems  Constitutional: Negative for malaise/fatigue and weight loss.  HENT: Positive for hearing loss. Negative for congestion and nosebleeds.   Respiratory: Negative for shortness of breath and wheezing.   Gastrointestinal: Negative for blood in stool, diarrhea, heartburn and melena.  Genitourinary: Negative for hematuria.  Musculoskeletal: Positive for joint pain. Negative for falls.  Neurological: Negative for dizziness, focal weakness and headaches.  Psychiatric/Behavioral: Positive for memory loss. Negative for depression. The patient is not nervous/anxious and does not have insomnia.   All other systems reviewed and are negative.  I have reviewed and (if needed) personally updated the patient's problem list, medications, allergies, past medical and surgical history, social and family history.   PAST MEDICAL HISTORY   Past Medical History:  Diagnosis Date  . Anemia   . BPH (benign prostatic hyperplasia)   . CAD (coronary artery disease)    hx of stemi  04-2018   .  Chronic renal failure   . Chronic renal insufficiency   . COPD (chronic obstructive pulmonary disease) ([place])   . DDD (degenerative disc disease), lumbar   . Diabetes mellitus   . Hyperlipidemia   . Hyperparathyroidism ([place])   . Hypertension   . IDA (iron deficiency anemia)   . Neuromuscular disorder ([place])   . OSA on CPAP   . Osteomyelitis of forearm ([place])   . Osteoporosis     PAST SURGICAL HISTORY   Past Surgical History:   Procedure Laterality Date  . CORONARY STENT INTERVENTION N/A 04/20/2018   Procedure: CORONARY STENT INTERVENTION;  Surgeon: Leonie Man, MD;  Location: [facility] INVASIVE CV LAB;;;    . [place] [place] ACUTE MI REVASCULARIZATION N/A 04/20/2018   Procedure: Coronary/Graft Acute MI Revascularization;  Surgeon: Leonie Man, MD;  Location: [place] CV LAB;  Service: Cardiovascular;  Laterality: N/A; --> after initial PTCA restoring flow down the RCA, there was diffuse proximal to mid disease treated with a DES SYNERGY 3 X 38 ( 3.50mm)  . EYE SURGERY    . HOLTER MONITOR  05/2018   Sinus rhythm noted with sinus bradycardia.  Also sinus rhythm with 2-1 AV block noted.  Maximum heart rate was sinus tachycardia 123 bpm.  Rare PVCs accelerated idioventricular rhythm noted.  Intermittent Wenckebach block noted.  . I&D EXTREMITY Right 08/13/2016   Procedure: IRRIGATION AND DEBRIDEMENT RIGHT ELBOW AND HAND;  Surgeon: Milly Jakob, MD;  Location: [facility] ORS;  Service: Orthopedics;  Laterality: Right;  . LEFT HEART CATH AND CORONARY ANGIOGRAPHY N/A 04/20/2018   Procedure: LEFT HEART CATH AND CORONARY ANGIOGRAPHY;  Surgeon: Leonie Man, MD;  Location: [place] CV LAB; mRCA 100%&80% (after 55% tapering in pRCA), mLM-ostLAD 40% w/ 85% ostCx, ost-pCx 65% @ Om1 w/ ost 85%. pLAD 60% & 50% after D1 with distal 75%, ostD1 65% & 90% after small side branch.    . TRANSTHORACIC ECHOCARDIOGRAM  04/21/2018   EF 50-55%. Posterior Wall HK. Severe RV HK, dilated IVC. Temp wire in place  . VASECTOMY    . VIDEO BRONCHOSCOPY Bilateral 08/25/2012   Procedure: VIDEO BRONCHOSCOPY WITHOUT FLUORO;  Surgeon: Brand Males, MD;  Location: [place];  Service: Cardiopulmonary;  Laterality: Bilateral;   Cath-PCI 04/2018     MEDICATIONS/ALLERGIES   Current Meds  Medication Sig  . ACCU-CHEK SOFTCLIX LANCETS lancets TEST BLOOD SUGAR FOUR TIMES DAILY  . albuterol (PROVENTIL HFA;VENTOLIN HFA) 108 (90 Base) MCG/ACT  inhaler Inhale 1-2 puffs into the lungs every 6 (six) hours as needed for wheezing or shortness of breath.  Marland Kitchen atorvastatin (LIPITOR) 80 MG tablet Take 0.5 tablets (40 mg total) by mouth daily.  . benzonatate (TESSALON) 100 MG capsule Take by mouth 3 (three) times daily as needed for cough.  . budesonide-formoterol (SYMBICORT) 160-4.5 MCG/ACT inhaler Inhale 2 puffs into the lungs 2 (two) times daily.  . cholecalciferol (VITAMIN D) 1000 units tablet Take 2,000 Units by mouth daily.  . Continuous Blood Gluc Receiver (FREESTYLE LIBRE 14 DAY READER) DEVI 1 each by Does not apply route every 14 (fourteen) days. Use reader to monitor blood sugar continuously with freestyle libre sensor.  . Continuous Blood Gluc Sensor (FREESTYLE LIBRE 14 DAY SENSOR) MISC 1 each by Does not apply route every 14 (fourteen) days. Apply 1 sensor to body once every 14 days to monitor blood sugars continuously.  . DROPLET INSULIN SYRINGE 31G X 5/16" 0.3 ML MISC USE THREE TIMES DAILY  . finasteride (PROSCAR) 5 MG tablet TAKE 1  TABLET EVERY DAY  . furosemide (LASIX) 20 MG tablet TAKE 1 TABLET BY MOUTH 5 TIMES WEEKLY(SUNDAY,MONDAY,TUESDAY, THURSDAY,SATURDAY) (Patient taking differently: 20 mg daily. )  . glucose blood test strip Use as instructed to test blood sugars 4 times daily  . GNP Alcohol Swabs 70 % PADS USE 7 SWABS DAILY AS DIRECTED  . insulin aspart (NOVOLOG) 100 UNIT/ML injection TAKE 7 UNITS IN THE MORNING, 6 UNITS AT LUNCH, AND 9 UNITS AT SUPPER. (Patient taking differently: Inject 8-10 units under the skin three times daily before meals.)  . isosorbide mononitrate (IMDUR) 30 MG 24 hr tablet TAKE 1 TABLET BY MOUTH DAILY  . LANTUS 100 UNIT/ML injection INJECT  26  UNITS SUBCUTANEOUSLY EVERY MORNING (DISCARD VIAL 28 DAYS AFTER OPENING)  . levofloxacin (LEVAQUIN) 500 MG tablet Take 500 mg by mouth daily.  . Melatonin 5 MG TABS Take 2 tablets by mouth at bedtime.   . Multiple Vitamin (MULTIVITAMIN WITH MINERALS) TABS tablet  Take 1 tablet by mouth daily.  Marland Kitchen omeprazole (PRILOSEC) 20 MG capsule Take 1 capsule (20 mg total) by mouth daily.  . [DISCONTINUED] BRILINTA 90 MG TABS tablet TAKE 1 TABLET BY MOUTH TWICE DAILY  . [DISCONTINUED] fesoterodine (TOVIAZ) 4 MG TB24 tablet Take 4 mg by mouth daily.    Allergies  Allergen Reactions  . Doxycycline Rash  . Levaquin [Levofloxacin In D5w]     Caused chest pain and heartburn  . Nitrous Oxide Other (See Comments)    Reaction:  Unknown   . Penicillins Other (See Comments)    Reaction:  Unknown Has patient had a PCN reaction causing immediate rash, facial/tongue/throat swelling, SOB or lightheadedness with hypotension: Unsure Has patient had a PCN reaction causing severe rash involving mucus membranes or skin necrosis: Unsure Has patient had a PCN reaction that required hospitalization Unsure  Has patient had a PCN reaction occurring within the last 10 years: No If all of the above answers are "NO", then may proceed with Cephalosporin use.  . Tape Other (See Comments)    Reaction:  Tears pts skin   . Diltiazem Rash    SOCIAL HISTORY/FAMILY HISTORY   Social History   Tobacco Use  . Smoking status: Former Smoker    Packs/day: 3.00    Years: 33.00    Pack years: 99.00    Types: Cigarettes    Quit date: 06/19/1973    Years since quitting: 45.9  . Smokeless tobacco: Former Systems developer    Quit date: 09/26/1975  Substance Use Topics  . Alcohol use: No  . Drug use: No   Social History   Social History Narrative   He is accompanied by his daughter who seems to be quite involved with his care.      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 He also went to the [facility] several times a week until he began to have symptoms.     Family History family history includes CVA in his father; Cancer in his sister; Diabetes Mellitus II in an other family member; Hypertension in an other family member; Other in his brother and father.    OBJCTIVE -PE, EKG, labs   Wt Readings from Last 3 Encounters:  05/21/19 190 lb (86.2 kg)  03/05/19 188 lb 12.8 oz (85.6 kg)  11/11/18 180 lb (81.6 kg)    Physical Exam: BP (!) 141/69   Pulse 83   Temp (!) 97.3 F (36.3 C)   Ht 5\' 10"  (1.778  m)   Wt 190 lb (86.2 kg)   SpO2 95%   BMI 27.26 kg/m  Physical Exam  Constitutional: He is oriented to person, place, and time. He appears well-developed and well-nourished. No distress.  The appearing elderly gentleman.  Well-groomed.  HENT:  Head: Normocephalic and atraumatic.  Hard of hearing  Neck: Normal range of motion. Neck supple. No hepatojugular reflux and no JVD present. Carotid bruit is not present. No thyromegaly present.  Cardiovascular: Normal rate, regular rhythm, S1 normal, S2 normal, intact distal pulses and normal pulses.  Occasional extrasystoles are present. PMI is not displaced. Exam reveals distant heart sounds. Exam reveals no gallop and no friction rub.  No murmur heard. Pulmonary/Chest: Effort normal and breath sounds normal. No respiratory distress. He has no wheezes. He has no rales.  Abdominal: Bowel sounds are normal. He exhibits no distension. There is no abdominal tenderness. There is no rebound.  Musculoskeletal: Normal range of motion.        General: No edema.  Neurological: He is alert and oriented to person, place, and time.  Psychiatric: He has a normal mood and affect. His behavior is normal. Judgment and thought content normal.  Vitals reviewed.   Adult ECG Report  Rate: 83;  Rhythm: normal sinus rhythm, premature ventricular contractions (PVC) and 1 degree AVB.  Can exclude anterior MI, age undetermined.;   Narrative Interpretation: Relatively stable EKG  Recent Labs: 03/05/2019: TC 102, TG 65, HDL 38, LDL 51.  A1c 6.1.  Hemoglobin 9.6.  Cr 2.14.  K+ 4.2. Lab Results  Component Value Date   CHOL 108 05/09/2018   HDL 35.30 (L) 05/09/2018   LDLCALC 56 05/09/2018   TRIG 83.0 05/09/2018   CHOLHDL 3  05/09/2018   Lab Results  Component Value Date   CREATININE 2.50 (H) 08/07/2018   BUN 40 (H) 08/07/2018   NA 139 08/07/2018   K 4.0 08/07/2018   CL 105 08/07/2018   CO2 23 08/07/2018    ASSESSMENT/PLAN    Problem List Items Addressed This Visit    CAD, multiple vessel - Primary (Chronic)    Multivessel CAD with PCI of the occluded RCA in the setting of inferior STEMI.  He has pretty significant ostial circumflex as well as moderate LAD and severe diagonal disease.  Plan for now is to continue medical management of the existing disease.  As long as he is walking a mile a day, no need for further intervention.  Unfortunately, he has had hypotension in the past he may not been able to tolerate any beta-blocker or ARB.  He is on Imdur along with statin. Is on Brilinta alone without aspirin and is ready for decreasing this dose to maintenance dose of 60 mg twice daily.      Relevant Medications   nitroGLYCERIN (NITROSTAT) 0.4 MG SL tablet   Other Relevant Orders   EKG 12-Lead   Presence of drug-eluting stent in right coronary artery (Chronic)    Long DES PCI to RCA in setting of inferior STEMI.  He does have history of GI bleed and therefore we stopped his aspirin and continued Brilinta alone.  He is now a year out from his MI, and we can reduce him to maintenance dose of 60 mg twice daily Brilinta.  Going forward, it is okay to hold Brilinta for procedures, GI bleeding etc.  He did better with Brilinta than he did with aspirin.  We therefore will maintain maintenance dose Brilinta.      Hyperlipidemia  associated with type 2 diabetes mellitus (HCC) (Chronic)    Lipids are pretty much at goal on current dose of atorvastatin.  Down to 40 mg daily.  Tolerating well.      Essential hypertension (Chronic)    Borderline elevated blood pressure today, but has had a history of relatively low blood pressures.  Would probably err on the side of allowing for mild permissive hypertension.   He is currently on Imdur at stable dose but not on any other blood pressure medications because of prior hypotension.  Continue Imdur and furosemide.      Relevant Medications   nitroGLYCERIN (NITROSTAT) 0.4 MG SL tablet   Other Relevant Orders   EKG 12-Lead   AV heart block (Chronic)    Has first-degree A-V block and has had a history of sinus bradycardia noted in the past.  As such, we are not using beta-blocker for this gentleman with CAD.      Relevant Medications   nitroGLYCERIN (NITROSTAT) 0.4 MG SL tablet       COVID-19 Education: The signs and symptoms of COVID-19 were discussed with the patient and how to seek care for testing (follow up with PCP or arrange E-visit).   The importance of social distancing was discussed today.  I spent a total of 20 minutes with the patient and chart review. >  50% of the time was spent in direct patient consultation.  Additional time spent with chart review (studies, outside notes, etc): 8 Total Time: 28 min   Current medicines are reviewed at length with the patient today.  (+/- concerns) n/a   Patient Instructions / Medication Changes & Studies & Tests Ordered   Patient Instructions  Medication Instructions:   change to taking Brilinta 60 mg  Twice a daily after you complete this bottle of  90 mg  *If you need a refill on your cardiac medications before your next appointment, please call your pharmacy*  Lab Work: Not needed  Testing/Procedures: Not needed  Follow-Up: At [facility], you and your health needs are our priority.  As part of our continuing mission to provide you with exceptional heart care, we have created designated Provider Care Teams.  These Care Teams include your primary Cardiologist (physician) and Advanced Practice Providers (APPs -  Physician Assistants and Nurse Practitioners) who all work together to provide you with the care you need, when you need it.  Your next appointment:   6 month(s)  The  format for your next appointment:   In Person  Provider:   Glenetta Hew, MD  Other Instructions   Studies Ordered:   Orders Placed This Encounter  Procedures  . EKG 12-Lead     Glenetta Hew, M.D., M.S. Interventional Cardiologist   Pager # [phone] Phone # [phone] [address] [place], [place] [address]   Thank you for choosing Heartcare at [facility]!!

## 2019-05-21 NOTE — Patient Instructions (Addendum)
Medication Instructions:   change to taking Brilinta 60 mg  Twice a daily after you complete this bottle of  90 mg  *If you need a refill on your cardiac medications before your next appointment, please call your pharmacy*  Lab Work: Not needed  Testing/Procedures: Not needed  Follow-Up: At [facility], you and your health needs are our priority.  As part of our continuing mission to provide you with exceptional heart care, we have created designated Provider Care Teams.  These Care Teams include your primary Cardiologist (physician) and Advanced Practice Providers (APPs -  Physician Assistants and Nurse Practitioners) who all work together to provide you with the care you need, when you need it.  Your next appointment:   6 month(s)  The format for your next appointment:   In Person  Provider:   Glenetta Hew, MD  Other Instructions

## 2019-05-22 DIAGNOSIS — L570 Actinic keratosis: Secondary | ICD-10-CM | POA: Diagnosis not present

## 2019-05-22 DIAGNOSIS — C44319 Basal cell carcinoma of skin of other parts of face: Secondary | ICD-10-CM | POA: Diagnosis not present

## 2019-05-22 DIAGNOSIS — X32XXXD Exposure to sunlight, subsequent encounter: Secondary | ICD-10-CM | POA: Diagnosis not present

## 2019-05-22 DIAGNOSIS — L820 Inflamed seborrheic keratosis: Secondary | ICD-10-CM | POA: Diagnosis not present

## 2019-05-22 DIAGNOSIS — Z08 Encounter for follow-up examination after completed treatment for malignant neoplasm: Secondary | ICD-10-CM | POA: Diagnosis not present

## 2019-05-22 DIAGNOSIS — Z85828 Personal history of other malignant neoplasm of skin: Secondary | ICD-10-CM | POA: Diagnosis not present

## 2019-05-24 ENCOUNTER — Encounter: Payer: Self-pay | Admitting: Cardiology

## 2019-05-24 NOTE — Assessment & Plan Note (Signed)
Long DES PCI to RCA in setting of inferior STEMI.  He does have history of GI bleed and therefore we stopped his aspirin and continued Brilinta alone.  He is now a year out from his MI, and we can reduce him to maintenance dose of 60 mg twice daily Brilinta.  Going forward, it is okay to hold Brilinta for procedures, GI bleeding etc.  He did better with Brilinta than he did with aspirin.  We therefore will maintain maintenance dose Brilinta.

## 2019-05-24 NOTE — Assessment & Plan Note (Signed)
Lipids are pretty much at goal on current dose of atorvastatin.  Down to 40 mg daily.  Tolerating well.

## 2019-05-24 NOTE — Assessment & Plan Note (Addendum)
Multivessel CAD with PCI of the occluded RCA in the setting of inferior STEMI.  He has pretty significant ostial circumflex as well as moderate LAD and severe diagonal disease.  Plan for now is to continue medical management of the existing disease.  As long as he is walking a mile a day, no need for further intervention.  Unfortunately, he has had hypotension in the past he may not been able to tolerate any beta-blocker or ARB.  He is on Imdur along with statin. Is on Brilinta alone without aspirin and is ready for decreasing this dose to maintenance dose of 60 mg twice daily.

## 2019-05-24 NOTE — Assessment & Plan Note (Signed)
Borderline elevated blood pressure today, but has had a history of relatively low blood pressures.  Would probably err on the side of allowing for mild permissive hypertension.  He is currently on Imdur at stable dose but not on any other blood pressure medications because of prior hypotension.  Continue Imdur and furosemide.

## 2019-05-24 NOTE — Assessment & Plan Note (Signed)
Has first-degree A-V block and has had a history of sinus bradycardia noted in the past.  As such, we are not using beta-blocker for this gentleman with CAD.

## 2019-06-02 ENCOUNTER — Other Ambulatory Visit: Payer: Self-pay

## 2019-06-04 ENCOUNTER — Encounter: Payer: Self-pay | Admitting: Endocrinology

## 2019-06-04 ENCOUNTER — Ambulatory Visit: Payer: Medicare PPO | Admitting: Endocrinology

## 2019-06-04 VITALS — BP 108/60 | HR 80 | Ht 70.0 in | Wt 188.6 lb

## 2019-06-04 DIAGNOSIS — E1165 Type 2 diabetes mellitus with hyperglycemia: Secondary | ICD-10-CM | POA: Diagnosis not present

## 2019-06-04 DIAGNOSIS — Z794 Long term (current) use of insulin: Secondary | ICD-10-CM

## 2019-06-04 DIAGNOSIS — D508 Other iron deficiency anemias: Secondary | ICD-10-CM | POA: Diagnosis not present

## 2019-06-04 DIAGNOSIS — N184 Chronic kidney disease, stage 4 (severe): Secondary | ICD-10-CM

## 2019-06-04 LAB — POCT GLYCOSYLATED HEMOGLOBIN (HGB A1C): Hemoglobin A1C: 5.5 % (ref 4.0–5.6)

## 2019-06-04 NOTE — Progress Notes (Signed)
Patient ID: Clayton [name], male   DOB: 04-Oct-1927, 85 y.o.   MRN: [id number]     Reason for Appointment: Followup of diabetes and various issues  History of Present Illness   Type 2 DIABETES MELITUS diagnosed 1989  He has had long-standing diabetes and has been on basal bolus insulin for the last few years Since 2014 he has required larger doses of insulin especially with having to take periodic courses of steroids for his pulmonary problems Also his Actos was stopped because of tendency to edema and metformin stopped because of renal dysfunction  Insulin regimen: Novolog 8-8-8 units before meals, Lantus 22units in a.m., using syringes  A1c is last at 6.7 and was drawn by PCP today    Current blood sugar patterns and problems identified:  His blood sugars are all relatively lower than actual readings from the freestyle libre, this was correlated on the last visit but on this visit he did not have a reading at the time of his lab draw  Again he has no normal readings before meals and overnight but he does not have symptomatic hypoglycemia usually despite having 11% of his readings below 70  Most of the low sugars seen on the CGM are overnight  This is despite reducing his Lantus 2 units previously taking it in the morning  He is taking about the same amount of insulin to cover most of the meals with some adjustment based on his meal size and Premeal blood sugar  Now being monitored with the freestyle libre that he started in 5/20 after instructions from nurse educator  Not checking fingersticks  His diet is fairly consistent  Weight is about the same  He says he still tries to walk up to over a a mile although slowly most of the days of the week  Has been quite regular with taking insulin as directed including Premeal NovoLog  Also checking blood sugars several times a day and occasionally at night     Oral hypoglycemic drugs: None           Monitors  blood glucose:  3-4 times a day   Glucometer: Freestyle libre   CONTINUOUS GLUCOSE MONITORING RECORD INTERPRETATION    Dates of Recording: 12/12 through 12/17  Sensor description: [employer]  Results statistics:   CGM use % of time  97  Average and SD 107, GV 30  Time in range       86%  % Time Above 180  3  % Time above 250   % Time Below target  11    PRE-MEAL Fasting Lunch Dinner Bedtime Overall  Glucose range:       Mean/median:  87  91  99   107   POST-MEAL PC Breakfast PC Lunch PC Dinner  Glucose range:     Mean/median:  112  126  107    Glycemic patterns summary: His CGM has been reading about 20 mg lower than actual reading as of the previous visit Blood sugars are mostly within the target range but 11% below 70 on the CGM   Hyperglycemic episodes are occurring only rarely after dinner and once late at night  Hypoglycemic episodes occurred mostly overnight 2-4 AM and before meals as seen on the CGM.  Is usually asymptomatic  Overnight periods: Blood sugars are frequently getting low primarily between 2-4 AM and then gradually increasing  Preprandial periods: Blood sugars are averaging in the  normal range at meals, lowest at breakfast and highest at dinnertime with averages as above.  Occasionally blood sugars are below 70 also  Postprandial periods:   After breakfast:   Blood sugars are minimally higher postprandial overall  After lunch:   Glucose may be rising about 30 mg on average with only a little variability After dinner: Blood sugars are relatively flat postprandially on an average with some more variability but no postprandial hypoglycemia until after 11 PM   Previous data: CGM use % of time  99  Average and SD 118  Time in range     83   %  % Time Above 180  7  % Time above 250   % Time Below target  10    Glycemic patterns summary: His blood sugar today in the office was 165 compared to his freestyle libre reading of 145 at the same  time He has postprandial hyperglycemia most significantly after lunch although absolute readings are still reasonably good with highest average 155 Has tendency to periodic low sugars after midnight, before noon and around 6 PM  Hyperglycemic episodes are occurring with meals to a variable extent and more significantly going up after lunch  Hypoglycemic episodes occurred as above with blood sugars as low as 46  Overnight periods: Blood sugars are low normal to low on his sensor readings between about 11:30 PM and 3 AM  Preprandial periods: Blood sugars are excellent before meals with averages below  Postprandial periods:   After breakfast: He has variable rise in his blood sugar depending on his carbohydrate intake but usually not significant  After lunch:   Glucose does tend to rise at least 40 to 50 mg on an average  After dinner: Blood sugars are more fluctuating and occasionally going up over 180   PRE-MEAL Fasting Lunch Dinner  2-4 AM Overall  Glucose range:       Mean/median:  103    88  118   POST-MEAL PC Breakfast PC Lunch PC Dinner  Glucose range:     Mean/median:  132  150  155   PREVIOUS readings:    PRE-MEAL Fasting Lunch Dinner Bedtime Overall  Glucose range:  63-138  111, 122  94, 109  93, 118   Mean/median:      114   POST-MEAL PC Breakfast PC Lunch PC Dinner  +Glucose range:  122  74, 163   Mean/median:        Meals: 3 meals per day.  breakfast: oatmeal / grits, with peanut butter, for lunch he will have a sandwich with apple and grapefruit   Dinner usually at 6:30 pm        Physical activity: exercise: Walking as above  Weight history:  Wt Readings from Last 3 Encounters:  06/04/19 188 lb 9.6 oz (85.5 kg)  05/21/19 190 lb (86.2 kg)  03/05/19 188 lb 12.8 oz (85.6 kg)   LABS:  Lab Results  Component Value Date   HGBA1C 5.5 06/04/2019   HGBA1C 6.7 12/02/2018   HGBA1C 6.8 (H) 08/07/2018   Lab Results  Component Value Date   MICROALBUR 5.4  (H) 08/07/2018   LDLCALC 56 05/09/2018   CREATININE 2.25 (H) 06/04/2019   Problem 2:  RENAL insufficiency: He has had chronic increase in creatinine, was 1.7 in 2014 with slow progression . This has been related to nephropathy and glomerulosclerosis Has been evaluated by nephrologist  Renal ultrasound did not show any abnormality  Has been followed  by nephrologist, last visit 2/20 but has not been going back for follow-up   Lab Results  Component Value Date   CREATININE 2.25 (H) 06/04/2019   CREATININE 2.50 (H) 08/07/2018   CREATININE 2.44 (H) 06/13/2018   CREATININE 2.57 (H) 05/09/2018     He has had a normal PTH, not on calcitriol and only on vitamin D   Lab Results  Component Value Date   PTH 30 06/04/2019   CALCIUM 10.0 06/04/2019   CAION 1.18 04/20/2018   PHOS 2.3 04/30/2014      Allergies as of 06/04/2019      Reactions   Doxycycline Rash   Levaquin [levofloxacin In D5w]    Caused chest pain and heartburn   Nitrous Oxide Other (See Comments)   Reaction:  Unknown    Penicillins Other (See Comments)   Reaction:  Unknown Has patient had a PCN reaction causing immediate rash, facial/tongue/throat swelling, SOB or lightheadedness with hypotension: Unsure Has patient had a PCN reaction causing severe rash involving mucus membranes or skin necrosis: Unsure Has patient had a PCN reaction that required hospitalization Unsure  Has patient had a PCN reaction occurring within the last 10 years: No If all of the above answers are "NO", then may proceed with Cephalosporin use.   Tape Other (See Comments)   Reaction:  Tears pts skin    Diltiazem Rash      Medication List       Accurate as of June 04, 2019 11:59 PM. If you have any questions, ask your nurse or doctor.        Accu-Chek Softclix Lancets lancets TEST BLOOD SUGAR FOUR TIMES DAILY   albuterol 108 (90 Base) MCG/ACT inhaler Commonly known as: VENTOLIN HFA Inhale 1-2 puffs into the lungs every 6  (six) hours as needed for wheezing or shortness of breath.   atorvastatin 80 MG tablet Commonly known as: LIPITOR Take 0.5 tablets (40 mg total) by mouth daily.   benzonatate 100 MG capsule Commonly known as: TESSALON Take by mouth 3 (three) times daily as needed for cough.   budesonide-formoterol 160-4.5 MCG/ACT inhaler Commonly known as: Symbicort Inhale 2 puffs into the lungs 2 (two) times daily.   cholecalciferol 1000 units tablet Commonly known as: VITAMIN D Take 2,000 Units by mouth daily.   Droplet Insulin Syringe 31G X 5/16" 0.3 ML Misc Generic drug: Insulin Syringe-Needle U-100 USE THREE TIMES DAILY   finasteride 5 MG tablet Commonly known as: PROSCAR TAKE 1 TABLET EVERY DAY   FreeStyle Libre 14 Day Reader Kerrin Mo 1 each by Does not apply route every 14 (fourteen) days. Use reader to monitor blood sugar continuously with freestyle libre sensor.   FreeStyle Libre 14 Day Sensor Misc 1 each by Does not apply route every 14 (fourteen) days. Apply 1 sensor to body once every 14 days to monitor blood sugars continuously.   furosemide 20 MG tablet Commonly known as: LASIX TAKE 1 TABLET BY MOUTH 5 TIMES WEEKLY(SUNDAY,MONDAY,TUESDAY, THURSDAY,SATURDAY) What changed: See the new instructions.   glucose blood test strip Use as instructed to test blood sugars 4 times daily   GNP Alcohol Swabs 70 % Pads USE 7 SWABS DAILY AS DIRECTED   insulin aspart 100 UNIT/ML injection Commonly known as: novoLOG TAKE 7 UNITS IN THE MORNING, 6 UNITS AT LUNCH, AND 9 UNITS AT SUPPER. What changed: additional instructions   isosorbide mononitrate 30 MG 24 hr tablet Commonly known as: IMDUR TAKE 1 TABLET BY MOUTH DAILY   Lantus 100  UNIT/ML injection Generic drug: insulin glargine INJECT  26  UNITS SUBCUTANEOUSLY EVERY MORNING (DISCARD VIAL 28 DAYS AFTER OPENING)   levofloxacin 500 MG tablet Commonly known as: LEVAQUIN Take 500 mg by mouth daily.   Melatonin 5 MG Tabs Take 2 tablets  by mouth at bedtime.   multivitamin with minerals Tabs tablet Take 1 tablet by mouth daily.   nitroGLYCERIN 0.4 MG SL tablet Commonly known as: NITROSTAT Place 1 tablet (0.4 mg total) under the tongue every 5 (five) minutes as needed for chest pain.   omeprazole 20 MG capsule Commonly known as: PRILOSEC Take 1 capsule (20 mg total) by mouth daily.   oxybutynin 5 MG tablet Commonly known as: DITROPAN   ticagrelor 60 MG Tabs tablet Commonly known as: Brilinta Take 1 tablet (60 mg total) by mouth 2 (two) times daily.       Allergies:  Allergies  Allergen Reactions  . Doxycycline Rash  . Levaquin [Levofloxacin In D5w]     Caused chest pain and heartburn  . Nitrous Oxide Other (See Comments)    Reaction:  Unknown   . Penicillins Other (See Comments)    Reaction:  Unknown Has patient had a PCN reaction causing immediate rash, facial/tongue/throat swelling, SOB or lightheadedness with hypotension: Unsure Has patient had a PCN reaction causing severe rash involving mucus membranes or skin necrosis: Unsure Has patient had a PCN reaction that required hospitalization Unsure  Has patient had a PCN reaction occurring within the last 10 years: No If all of the above answers are "NO", then may proceed with Cephalosporin use.  . Tape Other (See Comments)    Reaction:  Tears pts skin   . Diltiazem Rash    Past Medical History:  Diagnosis Date  . Anemia   . BPH (benign prostatic hyperplasia)   . CAD (coronary artery disease)    hx of stemi  04-2018   . Chronic renal failure   . Chronic renal insufficiency   . COPD (chronic obstructive pulmonary disease) ([place])   . DDD (degenerative disc disease), lumbar   . Diabetes mellitus   . Hyperlipidemia   . Hyperparathyroidism ([place])   . Hypertension   . IDA (iron deficiency anemia)   . Neuromuscular disorder ([place])   . OSA on CPAP   . Osteomyelitis of forearm ([place])   . Osteoporosis     Past Surgical History:  Procedure Laterality  Date  . CORONARY STENT INTERVENTION N/A 04/20/2018   Procedure: CORONARY STENT INTERVENTION;  Surgeon: Leonie Man, MD;  Location: [facility] INVASIVE CV LAB;;;    . Remus Blake ACUTE MI REVASCULARIZATION N/A 04/20/2018   Procedure: Coronary/Graft Acute MI Revascularization;  Surgeon: Leonie Man, MD;  Location: [place] CV LAB;  Service: Cardiovascular;  Laterality: N/A; --> after initial PTCA restoring flow down the RCA, there was diffuse proximal to mid disease treated with a DES SYNERGY 3 X 38 ( 3.67mm)  . EYE SURGERY    . HOLTER MONITOR  05/2018   Sinus rhythm noted with sinus bradycardia.  Also sinus rhythm with 2-1 AV block noted.  Maximum heart rate was sinus tachycardia 123 bpm.  Rare PVCs accelerated idioventricular rhythm noted.  Intermittent Wenckebach block noted.  . I & D EXTREMITY Right 08/13/2016   Procedure: IRRIGATION AND DEBRIDEMENT RIGHT ELBOW AND HAND;  Surgeon: Milly Jakob, MD;  Location: [facility] ORS;  Service: Orthopedics;  Laterality: Right;  . LEFT HEART CATH AND CORONARY ANGIOGRAPHY N/A 04/20/2018   Procedure: LEFT HEART CATH AND  CORONARY ANGIOGRAPHY;  Surgeon: Leonie Man, MD;  Location: [facility] INVASIVE CV LAB; mRCA 100%&80% (after 55% tapering in pRCA), mLM-ostLAD 40% w/ 85% ostCx, ost-pCx 65% @ Om1 w/ ost 85%. pLAD 60% & 50% after D1 with distal 75%, ostD1 65% & 90% after small side branch.    . TRANSTHORACIC ECHOCARDIOGRAM  04/21/2018   EF 50-55%. Posterior Wall HK. Severe RV HK, dilated IVC. Temp wire in place  . VASECTOMY    . VIDEO BRONCHOSCOPY Bilateral 08/25/2012   Procedure: VIDEO BRONCHOSCOPY WITHOUT FLUORO;  Surgeon: Brand Males, MD;  Location: [place];  Service: Cardiopulmonary;  Laterality: Bilateral;    Family History  Problem Relation Age of Onset  . Diabetes Mellitus II Other   . Hypertension Other   . Other Father        complications from strep  . CVA Father   . Cancer Sister   . Other Brother        hip replacement complications      Social History:  reports that he quit smoking about 45 years ago. His smoking use included cigarettes. He has a 99.00 pack-year smoking history. He quit smokeless tobacco use about 43 years ago. He reports that he does not drink alcohol or use drugs.  Review of Systems  BLOOD PRESSURE:  He is not on any treatment for this and blood pressure is normal    BP Readings from Last 3 Encounters:  06/04/19 108/60  05/21/19 (!) 141/69  03/05/19 (!) 130/50   Taking Lasix daily for edema   ANEMIA:   He has a  history of anemia related to renal dysfunction and mild iron deficiency  Has been treated with iron infusions previously Hematocrit followed by PCP now  Lab Results  Component Value Date   WBC 12.7 (H) 08/07/2018   HGB 9.7 (L) 08/07/2018   HCT 29.5 (L) 08/07/2018   MCV 96.1 08/07/2018   PLT 413.0 (H) 08/07/2018   Lab Results  Component Value Date   IRON 25 (L) 06/04/2019   TIBC 230 (L) 04/23/2018   FERRITIN 66.2 08/07/2018    He has had sleep apnea, using CPAP and this is being adjusted by pulmonologist.    He has had chronic lower urinary tract problems and BPH followed by urologist   HYPERLIPIDEMIA:     He is on atorvastatin since his MI prescribed by cardiologist, no recent labs available   Lab Results  Component Value Date   CHOL 108 05/09/2018   HDL 35.30 (L) 05/09/2018   LDLCALC 56 05/09/2018   TRIG 83.0 05/09/2018   CHOLHDL 3 05/09/2018   Lab Results  Component Value Date   ALT 21 06/04/2019        Examination:   BP 108/60 (BP Location: Left Arm, Patient Position: Sitting, Cuff Size: Normal)   Pulse 80   Ht 5\' 10"  (1.778 m)   Wt 188 lb 9.6 oz (85.5 kg)   SpO2 96%   BMI 27.06 kg/m   Body mass index is 27.06 kg/m.   Diabetic Foot Exam - Simple   Simple Foot Form Diabetic Foot exam was performed with the following findings: Yes   Visual Inspection No deformities, no ulcerations, no other skin breakdown bilaterally: Yes Sensation  Testing Intact to touch and monofilament testing bilaterally: Yes See comments: Yes Pulse Check Posterior Tibialis and Dorsalis pulse intact bilaterally: Yes See comments: Yes Comments Mild decrease in monofilament sensation distally on the plantar surface Right posterior tibialis pulse not palpable  Trace ankle edema on the right   Assesment/PLAN:   1. Diabetes type 2 with obesity treated with basal bolus insulin  See history of present illness for detailed discussion of current diabetes management, blood sugar patterns and problems identifie  A1c lower than expected at 5.5  As before his freestyle libre tends to read falsely low with 11% of the readings below 70 He is not symptomatic However as discussed above his blood sugars are the lowest overnight and occasionally before meals and not postprandial readings are generally well controlled with an average of 8 units of NovoLog Discussed adjustment of his mealtime dose based on total amount of carbohydrates and he can adjust his suppertime dose between 6-10 units for smaller and larger meals and additional 2 units for anything containing higher fat or any desserts Reminded him to take insulin before starting to eat  However considering his age and medical conditions at the same time he still has excellent range Since his freestyle Elenor [place]ato is reading falsely low not clear if he is truly having hypoglycemia However discussed in detail the use of mealtime insulin and the fact that he is taking insulin inappropriately postprandially and not proactively before he starts to eat Also is causing some hypoglycemia from taking postprandial insulin especially correction doses Occasionally may have low sugars on his freestyle libre without having taken NovoLog within 4 hours and likely needs a little less Lantus  We will reduce the Lantus dose to 18 units empirically to avoid potential of hypoglycemia even though lab glucose was not low late  afternoon on the day of his labs  Encouraged him to check blood sugars by fingerstick periodically to compare  Follow-up in 3 months  2.   Chronic kidney disease: His creatinine needs to be followed by nephrologist along with monitoring for his anemia, parathyroid status and phosphorus  3.  Low normal blood pressure: We will reduce his Lasix to every other day for now, currently not on antihypertensives  4.  History of iron deficiency: To check labs today   5.  Lipid status: Needs updated labs, if not done by cardiologist will recheck on the next visit  Patient Instructions  Reduce lantus to 18 units     Total visit time for evaluation and management of multiple problems and counseling =25 minutes    Elayne Snare  06/07/2019, 4:58 PM    Addendum: Iron saturation of 5%, he will follow-up with his PCP or nephrologist to arrange for iron infusion PTH normal

## 2019-06-04 NOTE — Patient Instructions (Signed)
Reduce lantus to 18 units

## 2019-06-05 LAB — COMPREHENSIVE METABOLIC PANEL
ALT: 21 U/L (ref 0–53)
AST: 31 U/L (ref 0–37)
Albumin: 4.4 g/dL (ref 3.5–5.2)
Alkaline Phosphatase: 104 U/L (ref 39–117)
BUN: 64 mg/dL — ABNORMAL HIGH (ref 6–23)
CO2: 26 mEq/L (ref 19–32)
Calcium: 10 mg/dL (ref 8.4–10.5)
Chloride: 103 mEq/L (ref 96–112)
Creatinine, Ser: 2.25 mg/dL — ABNORMAL HIGH (ref 0.40–1.50)
GFR: 27.58 mL/min — ABNORMAL LOW (ref >60.00)
Glucose, Bld: 103 mg/dL — ABNORMAL HIGH (ref 70–99)
Potassium: 4.2 mEq/L (ref 3.5–5.1)
Sodium: 137 mEq/L (ref 135–145)
Total Bilirubin: 0.3 mg/dL (ref 0.2–1.2)
Total Protein: 7.6 g/dL (ref 6.0–8.3)

## 2019-06-05 LAB — IBC PANEL
Iron: 25 ug/dL — ABNORMAL LOW (ref 42–165)
Saturation Ratios: 6.6 % — ABNORMAL LOW (ref 20.0–50.0)
Transferrin: 270 mg/dL (ref 212.0–360.0)

## 2019-06-05 LAB — PARATHYROID HORMONE, INTACT (NO CA): PTH: 30 pg/mL (ref 15–65)

## 2019-06-07 NOTE — Progress Notes (Signed)
Kidney test is slightly better but iron is low, if he is taking iron tablets twice a day regularly needs to contact his nephrologist or PCP for iron infusion otherwise start oral iron

## 2019-06-29 ENCOUNTER — Other Ambulatory Visit: Payer: Self-pay | Admitting: Endocrinology

## 2019-07-07 DIAGNOSIS — Z85828 Personal history of other malignant neoplasm of skin: Secondary | ICD-10-CM | POA: Diagnosis not present

## 2019-07-07 DIAGNOSIS — X32XXXD Exposure to sunlight, subsequent encounter: Secondary | ICD-10-CM | POA: Diagnosis not present

## 2019-07-07 DIAGNOSIS — L570 Actinic keratosis: Secondary | ICD-10-CM | POA: Diagnosis not present

## 2019-07-07 DIAGNOSIS — Z08 Encounter for follow-up examination after completed treatment for malignant neoplasm: Secondary | ICD-10-CM | POA: Diagnosis not present

## 2019-07-07 DIAGNOSIS — L820 Inflamed seborrheic keratosis: Secondary | ICD-10-CM | POA: Diagnosis not present

## 2019-07-27 ENCOUNTER — Other Ambulatory Visit: Payer: Self-pay | Admitting: Endocrinology

## 2019-08-21 DIAGNOSIS — X32XXXD Exposure to sunlight, subsequent encounter: Secondary | ICD-10-CM | POA: Diagnosis not present

## 2019-08-21 DIAGNOSIS — C44329 Squamous cell carcinoma of skin of other parts of face: Secondary | ICD-10-CM | POA: Diagnosis not present

## 2019-08-21 DIAGNOSIS — L570 Actinic keratosis: Secondary | ICD-10-CM | POA: Diagnosis not present

## 2019-08-21 DIAGNOSIS — C44319 Basal cell carcinoma of skin of other parts of face: Secondary | ICD-10-CM | POA: Diagnosis not present

## 2019-09-03 DIAGNOSIS — E1121 Type 2 diabetes mellitus with diabetic nephropathy: Secondary | ICD-10-CM | POA: Diagnosis not present

## 2019-09-03 DIAGNOSIS — D631 Anemia in chronic kidney disease: Secondary | ICD-10-CM | POA: Diagnosis not present

## 2019-09-03 DIAGNOSIS — I10 Essential (primary) hypertension: Secondary | ICD-10-CM | POA: Diagnosis not present

## 2019-09-03 DIAGNOSIS — E7800 Pure hypercholesterolemia, unspecified: Secondary | ICD-10-CM | POA: Diagnosis not present

## 2019-09-03 DIAGNOSIS — N184 Chronic kidney disease, stage 4 (severe): Secondary | ICD-10-CM | POA: Diagnosis not present

## 2019-09-09 ENCOUNTER — Other Ambulatory Visit: Payer: Self-pay

## 2019-09-10 ENCOUNTER — Ambulatory Visit: Payer: Medicare PPO | Admitting: Endocrinology

## 2019-09-10 ENCOUNTER — Encounter: Payer: Self-pay | Admitting: Endocrinology

## 2019-09-10 VITALS — BP 122/60 | HR 86 | Ht 70.0 in | Wt 183.0 lb

## 2019-09-10 DIAGNOSIS — Z794 Long term (current) use of insulin: Secondary | ICD-10-CM | POA: Diagnosis not present

## 2019-09-10 DIAGNOSIS — E160 Drug-induced hypoglycemia without coma: Secondary | ICD-10-CM

## 2019-09-10 DIAGNOSIS — E1165 Type 2 diabetes mellitus with hyperglycemia: Secondary | ICD-10-CM | POA: Diagnosis not present

## 2019-09-10 DIAGNOSIS — N184 Chronic kidney disease, stage 4 (severe): Secondary | ICD-10-CM

## 2019-09-10 DIAGNOSIS — R634 Abnormal weight loss: Secondary | ICD-10-CM | POA: Diagnosis not present

## 2019-09-10 DIAGNOSIS — T383X5A Adverse effect of insulin and oral hypoglycemic [antidiabetic] drugs, initial encounter: Secondary | ICD-10-CM

## 2019-09-10 LAB — POCT GLYCOSYLATED HEMOGLOBIN (HGB A1C): Hemoglobin A1C: 6.2 % — AB (ref 4.0–5.6)

## 2019-09-10 LAB — GLUCOSE, POCT (MANUAL RESULT ENTRY): POC Glucose: 66 mg/dl — AB (ref 70–99)

## 2019-09-10 NOTE — Patient Instructions (Signed)
If sugar over 140 in am take 9 Novolog   NO EXTRA NOVOLOG UNLESS SUGAR GO UP OVER 200

## 2019-09-10 NOTE — Progress Notes (Signed)
Patient ID: Clayton [name], male   DOB: [birth year], 86 y.o.   MRN: [id number]     Reason for Appointment: Followup of diabetes and various issues  History of Present Illness   Type 2 DIABETES MELITUS diagnosed 1989  He has had long-standing diabetes and has been on basal bolus insulin for the last few years Since 2014 he has required larger doses of insulin especially with having to take periodic courses of steroids for his pulmonary problems Also his Actos was stopped because of tendency to edema and metformin stopped because of renal dysfunction  Insulin regimen: Novolog 8-8-8 units before meals, Lantus 16 units in a.m., using syringes  A1c is now 6.2, however was 6.9 drawn by PCP earlier this month 6.7 and was drawn by PCP today   Current blood sugar patterns and problems identified:  His blood sugars are not as low with reducing his Lantus on the last visit  His blood sugars are averaging mostly in the 130s overnight on an average compared to below 90 previously  Although his freestyle Elenor Legato has previously been reading relatively lower than the actual reading in the office it is fairly close today  His main difficulty is getting low blood sugars in the 60s, mostly before lunch  He does enter his insulin administration times on his CGM which indicates that he is frequently taking extra insulin about 3 to 4 units when his blood sugars are above about 160 postprandially  This frequently will cause low blood sugars a couple hours later including today in the office  Also is getting about 6% of his readings below 70  He says he wants to keep his A1c down  However not adjusting his insulin higher if he has relatively high fasting reading at breakfast time  He may reduce his insulin if he is planning to exercise and usually avoid hypoglycemia with this His weight is about the same or lower, he thinks he is eating fairly well    Oral hypoglycemic drugs: None            Monitors blood glucose:  3-4 times a day   Glucometer: Freestyle libre   CONTINUOUS GLUCOSE MONITORING RECORD INTERPRETATION    Dates of Recording: 3/12 through 3/25  Sensor description: Elenor Legato  Results statistics:   CGM use % of time  98  Average and SD  120, GV 25  Time in range      91%  % Time Above 180  3  % Time above 250   % Time Below target  6    PRE-MEAL Fasting Lunch Dinner Bedtime Overall  Glucose range:       Mean/median:  137  100  109   120   POST-MEAL PC Breakfast PC Lunch PC Dinner  Glucose range:     Mean/median:  140  123  120    Glycemic patterns summary: Blood sugars are generally fairly consistently controlled with highest readings overall overnight and lowest readings before lunch.  Has some tendency to hypoglycemia before lunch and occasionally before or after dinner He has not compared his freestyle libre fingerstick lately  Hyperglycemic episodes are minimal with no significant rise in blood sugar except occasionally has a rebound from low sugars  Hypoglycemic episodes occurred  Overnight periods: Are fairly stable and mildly increased overall with no hypoglycemia  Preprandial periods: Blood sugars are modestly increased at breakfast time, relatively low at lunchtime and near normal  at dinnertime  Postprandial periods:   After breakfast: Usually blood sugars are on an average rising only minimally    After lunch: Glucose readings are variably higher but usually not rising over 25 mg on an average After dinner: Glucose readings are very stable with minimal increase after the meal on an average and with low readings about 2 times   Previous data:   CGM use % of time  97  Average and SD 107, GV 30  Time in range       86%  % Time Above 180  3  % Time above 250   % Time Below target  11    PRE-MEAL Fasting Lunch Dinner Bedtime Overall  Glucose range:       Mean/median:  87  91  99   107   POST-MEAL PC Breakfast PC Lunch PC Dinner    Glucose range:     Mean/median:  112  126  107    Meals: 3 meals per day.  breakfast usually about 5 AM: oatmeal / grits, with peanut butter, for lunch he will have a sandwich with apple around 11 AM  Dinner usually at 5 PM-6 PM       Weight history:  Wt Readings from Last 3 Encounters:  09/10/19 183 lb (83 kg)  06/04/19 188 lb 9.6 oz (85.5 kg)  05/21/19 190 lb (86.2 kg)   LABS:  Lab Results  Component Value Date   HGBA1C 5.5 06/04/2019   HGBA1C 6.7 12/02/2018   HGBA1C 6.8 (H) 08/07/2018   Lab Results  Component Value Date   MICROALBUR 5.4 (H) 08/07/2018   LDLCALC 56 05/09/2018   CREATININE 2.25 (H) 06/04/2019   Problem 2:  RENAL insufficiency: He has had chronic increase in creatinine, was 1.7 in 2014 with slow progression . This has been related to nephropathy and glomerulosclerosis Has been evaluated by nephrologist  Renal ultrasound did not show any abnormality  Has been followed by nephrologist, his visit today was canceled   Lab Results  Component Value Date   CREATININE 2.25 (H) 06/04/2019   CREATININE 2.50 (H) 08/07/2018   CREATININE 2.44 (H) 06/13/2018   CREATININE 2.57 (H) 05/09/2018   He has had a normal PTH, not on calcitriol and only on vitamin D   Lab Results  Component Value Date   PTH 30 06/04/2019   CALCIUM 10.0 06/04/2019   CAION 1.18 04/20/2018   PHOS 2.3 04/30/2014      Allergies as of 09/10/2019      Reactions   Doxycycline Rash   Levaquin [levofloxacin In D5w]    Caused chest pain and heartburn   Nitrous Oxide Other (See Comments)   Reaction:  Unknown    Penicillins Other (See Comments)   Reaction:  Unknown Has patient had a PCN reaction causing immediate rash, facial/tongue/throat swelling, SOB or lightheadedness with hypotension: Unsure Has patient had a PCN reaction causing severe rash involving mucus membranes or skin necrosis: Unsure Has patient had a PCN reaction that required hospitalization Unsure  Has patient had  a PCN reaction occurring within the last 10 years: No If all of the above answers are "NO", then may proceed with Cephalosporin use.   Tape Other (See Comments)   Reaction:  Tears pts skin    Diltiazem Rash      Medication List       Accurate as of September 10, 2019  4:07 PM. If you have any questions, ask your nurse or doctor.  Accu-Chek Softclix Lancets lancets TEST BLOOD SUGAR FOUR TIMES DAILY   albuterol 108 (90 Base) MCG/ACT inhaler Commonly known as: VENTOLIN HFA Inhale 1-2 puffs into the lungs every 6 (six) hours as needed for wheezing or shortness of breath.   atorvastatin 80 MG tablet Commonly known as: LIPITOR Take 0.5 tablets (40 mg total) by mouth daily.   B-D SINGLE USE SWABS REGULAR Pads USE 7 SWABS DAILY AS DIRECTED   benzonatate 100 MG capsule Commonly known as: TESSALON Take by mouth 3 (three) times daily as needed for cough.   budesonide-formoterol 160-4.5 MCG/ACT inhaler Commonly known as: Symbicort Inhale 2 puffs into the lungs 2 (two) times daily.   cholecalciferol 1000 units tablet Commonly known as: VITAMIN D Take 2,000 Units by mouth daily.   Droplet Insulin Syringe 31G X 5/16" 0.3 ML Misc Generic drug: Insulin Syringe-Needle U-100 USE THREE TIMES DAILY   finasteride 5 MG tablet Commonly known as: PROSCAR TAKE 1 TABLET EVERY DAY   FreeStyle Libre 14 Day Reader Kerrin Mo 1 each by Does not apply route every 14 (fourteen) days. Use reader to monitor blood sugar continuously with freestyle libre sensor.   FreeStyle Libre 14 Day Sensor Misc 1 each by Does not apply route every 14 (fourteen) days. Apply 1 sensor to body once every 14 days to monitor blood sugars continuously.   furosemide 20 MG tablet Commonly known as: LASIX Take 1 tablet (20 mg total) by mouth daily.   glucose blood test strip Use as instructed to test blood sugars 4 times daily   insulin aspart 100 UNIT/ML injection Commonly known as: novoLOG TAKE 7 UNITS IN THE  MORNING, 6 UNITS AT LUNCH, AND 9 UNITS AT SUPPER. What changed: additional instructions   isosorbide mononitrate 30 MG 24 hr tablet Commonly known as: IMDUR TAKE 1 TABLET BY MOUTH DAILY   Lantus 100 UNIT/ML injection Generic drug: insulin glargine INJECT  26  UNITS SUBCUTANEOUSLY EVERY MORNING (DISCARD VIAL 28 DAYS AFTER OPENING) What changed: See the new instructions.   levofloxacin 500 MG tablet Commonly known as: LEVAQUIN Take 500 mg by mouth daily.   melatonin 5 MG Tabs Take 2 tablets by mouth at bedtime.   multivitamin with minerals Tabs tablet Take 1 tablet by mouth daily.   nitroGLYCERIN 0.4 MG SL tablet Commonly known as: NITROSTAT Place 1 tablet (0.4 mg total) under the tongue every 5 (five) minutes as needed for chest pain.   omeprazole 20 MG capsule Commonly known as: PRILOSEC Take 1 capsule (20 mg total) by mouth daily.   oxybutynin 5 MG tablet Commonly known as: DITROPAN   ticagrelor 60 MG Tabs tablet Commonly known as: Brilinta Take 1 tablet (60 mg total) by mouth 2 (two) times daily.       Allergies:  Allergies  Allergen Reactions  . Doxycycline Rash  . Levaquin [Levofloxacin In D5w]     Caused chest pain and heartburn  . Nitrous Oxide Other (See Comments)    Reaction:  Unknown   . Penicillins Other (See Comments)    Reaction:  Unknown Has patient had a PCN reaction causing immediate rash, facial/tongue/throat swelling, SOB or lightheadedness with hypotension: Unsure Has patient had a PCN reaction causing severe rash involving mucus membranes or skin necrosis: Unsure Has patient had a PCN reaction that required hospitalization Unsure  Has patient had a PCN reaction occurring within the last 10 years: No If all of the above answers are "NO", then may proceed with Cephalosporin use.  . Tape Other (  See Comments)    Reaction:  Tears pts skin   . Diltiazem Rash    Past Medical History:  Diagnosis Date  . Anemia   . BPH (benign prostatic  hyperplasia)   . CAD (coronary artery disease)    hx of stemi  04-2018   . Chronic renal failure   . Chronic renal insufficiency   . COPD (chronic obstructive pulmonary disease) ([place])   . DDD (degenerative disc disease), lumbar   . Diabetes mellitus   . Hyperlipidemia   . Hyperparathyroidism ([place])   . Hypertension   . IDA (iron deficiency anemia)   . Neuromuscular disorder ([place])   . OSA on CPAP   . Osteomyelitis of forearm ([place])   . Osteoporosis     Past Surgical History:  Procedure Laterality Date  . CORONARY STENT INTERVENTION N/A 04/20/2018   Procedure: CORONARY STENT INTERVENTION;  Surgeon: Leonie Man, MD;  Location: [facility] INVASIVE CV LAB;;;    . Rem[facility] Blake ACUTE MI REVASCULARIZATION N/A 04/20/2018   Procedure: Coronary/Graft Acute MI Revascularization;  Surgeon: Leonie Man, MD;  Location: [place] CV LAB;  Service: Cardiovascular;  Laterality: N/A; --> after initial PTCA restoring flow down the RCA, there was diffuse proximal to mid disease treated with a DES SYNERGY 3 X 38 ( 3.33mm)  . EYE SURGERY    . HOLTER MONITOR  05/2018   Sinus rhythm noted with sinus bradycardia.  Also sinus rhythm with 2-1 AV block noted.  Maximum heart rate was sinus tachycardia 123 bpm.  Rare PVCs accelerated idioventricular rhythm noted.  Intermittent Wenckebach block noted.  . I & D EXTREMITY Right 08/13/2016   Procedure: IRRIGATION AND DEBRIDEMENT RIGHT ELBOW AND HAND;  Surgeon: Milly Jakob, MD;  Location: [facility] ORS;  Service: Orthopedics;  Laterality: Right;  . LEFT HEART CATH AND CORONARY ANGIOGRAPHY N/A 04/20/2018   Procedure: LEFT HEART CATH AND CORONARY ANGIOGRAPHY;  Surgeon: Leonie Man, MD;  Location: [place] CV LAB; mRCA 100%&80% (after 55% tapering in pRCA), mLM-ostLAD 40% w/ 85% ostCx, ost-pCx 65% @ Om1 w/ ost 85%. pLAD 60% & 50% after D1 with distal 75%, ostD1 65% & 90% after small side branch.    . TRANSTHORACIC ECHOCARDIOGRAM  04/21/2018   EF 50-55%. Posterior Wall  HK. Severe RV HK, dilated IVC. Temp wire in place  . VASECTOMY    . VIDEO BRONCHOSCOPY Bilateral 08/25/2012   Procedure: VIDEO BRONCHOSCOPY WITHOUT FLUORO;  Surgeon: Brand Males, MD;  Location: [facility];  Service: Cardiopulmonary;  Laterality: Bilateral;    Family History  Problem Relation Age of Onset  . Diabetes Mellitus II Other   . Hypertension Other   . Other Father        complications from strep  . CVA Father   . Cancer Sister   . Other Brother        hip replacement complications     Social History:  reports that he quit smoking about 46 years ago. His smoking use included cigarettes. He has a 99.00 pack-year smoking history. He quit smokeless tobacco use about 43 years ago. He reports that he does not drink alcohol or use drugs.  Review of Systems  BLOOD PRESSURE:  He is not on any treatment for high blood pressure and blood pressure is normal    BP Readings from Last 3 Encounters:  09/10/19 122/60  06/04/19 108/60  05/21/19 (!) 141/69   Taking Lasix for edema   ANEMIA:   He has a  history of  anemia related to renal dysfunction and mild iron deficiency  Has been treated with iron infusions previously but not clear what his hemoglobin is recently  Since his iron saturation was low he was recommended iron infusion on his last visit but he has not done so  Lab Results  Component Value Date   WBC 12.7 (H) 08/07/2018   HGB 9.7 (L) 08/07/2018   HCT 29.5 (L) 08/07/2018   MCV 96.1 08/07/2018   PLT 413.0 (H) 08/07/2018   Lab Results  Component Value Date   IRON 25 (L) 06/04/2019   TIBC 230 (L) 04/23/2018   FERRITIN 66.2 08/07/2018    He has had sleep apnea, using CPAP and this is being adjusted by pulmonologist.    He has had chronic lower urinary tract problems and BPH followed by urologist   HYPERLIPIDEMIA:     He is on atorvastatin since his MI prescribed by cardiologist, last LDL in 9/20 was 51  Lab Results  Component Value Date   CHOL 108  05/09/2018   HDL 35.30 (L) 05/09/2018   LDLCALC 56 05/09/2018   TRIG 83.0 05/09/2018   CHOLHDL 3 05/09/2018   Lab Results  Component Value Date   ALT 21 06/04/2019        Examination:   BP 122/60 (BP Location: Left Arm, Patient Position: Sitting, Cuff Size: Normal)   Pulse 86   Ht 5\' 10"  (1.778 m)   Wt 183 lb (83 kg)   SpO2 96%   BMI 26.26 kg/m   Body mass index is 26.26 kg/m.     Assesment/PLAN:   1. Diabetes type 2 with obesity treated with basal bolus insulin  See history of present illness for detailed discussion of current diabetes management, blood sugar patterns and problems identifie  A1c 6.2  Previously freestyle libre was found to read lower than the actual blood sugars but appears to have more accurate readings now Office fingerstick blood sugar was close to his freestyle libre reading of 66  However he still getting excessive hypoglycemia As discussed with the patient he is overtreating mildly increased postprandial high blood sugars which are consistently below 200 and this causes low sugars With reducing Lantus his fasting readings are about 130-140 which is adequate for him and safe His weight is down from regular exercise and lower portions  Needs to take insulin before starting to eat He still needs to check blood sugars by fingerstick periodically to compare with his Elenor Legato but also consider using the freestyle libre version 2  For now will not change his basic insulin regimen He needs to take extra 1 or 2 units of Humalog if he has higher readings before meals especially breakfast However he will not take any additional correction doses later in the day unless blood sugar is over 200 to avoid hypoglycemia Discussed blood sugar targets before and after meals Considering his age and comorbid conditions he does not need to have any more aggressive control  2.   Chronic kidney disease: His creatinine recently is about 2.4 and stable Discussed needing  to follow-up with a nephrologist for various systemic problems arising from his CKD   3.  He has had his Covid vaccine  4.  History of iron deficiency: To follow-up with nephrologist and consider iron infusion if needed   5.  Lipid status: Needs labs on the next visit  There are no Patient Instructions on file for this visit.   Follow-up in 3 months   Ladye Macnaughton Dwyane Dee  09/10/2019, 4:07 PM

## 2019-09-10 NOTE — Progress Notes (Signed)
CBG checked per instructions of Dr. Dwyane Dee and blood sugar noted to be 69mg /dL. Pt given 8oz. Apple juice and will recheck in 15 minutes. MD aware of pt's hypoglycemia. Recheck of blood glucose was noted to be 75mg /dL.

## 2019-09-25 DIAGNOSIS — Z08 Encounter for follow-up examination after completed treatment for malignant neoplasm: Secondary | ICD-10-CM | POA: Diagnosis not present

## 2019-09-25 DIAGNOSIS — Z85828 Personal history of other malignant neoplasm of skin: Secondary | ICD-10-CM | POA: Diagnosis not present

## 2019-09-25 DIAGNOSIS — L570 Actinic keratosis: Secondary | ICD-10-CM | POA: Diagnosis not present

## 2019-09-25 DIAGNOSIS — C44319 Basal cell carcinoma of skin of other parts of face: Secondary | ICD-10-CM | POA: Diagnosis not present

## 2019-09-25 DIAGNOSIS — X32XXXD Exposure to sunlight, subsequent encounter: Secondary | ICD-10-CM | POA: Diagnosis not present

## 2019-10-02 ENCOUNTER — Other Ambulatory Visit: Payer: Self-pay | Admitting: Physician Assistant

## 2019-10-05 NOTE — Telephone Encounter (Signed)
This is Dr. Harding's pt. °

## 2019-10-15 DIAGNOSIS — I129 Hypertensive chronic kidney disease with stage 1 through stage 4 chronic kidney disease, or unspecified chronic kidney disease: Secondary | ICD-10-CM | POA: Diagnosis not present

## 2019-10-15 DIAGNOSIS — N183 Chronic kidney disease, stage 3 unspecified: Secondary | ICD-10-CM | POA: Diagnosis not present

## 2019-10-30 ENCOUNTER — Other Ambulatory Visit: Payer: Self-pay | Admitting: Endocrinology

## 2019-11-03 ENCOUNTER — Other Ambulatory Visit: Payer: Self-pay

## 2019-11-03 MED ORDER — FUROSEMIDE 20 MG PO TABS: 20.0000 mg | ORAL_TABLET | Freq: Every day | ORAL | 1 refills | Status: DC

## 2019-11-09 ENCOUNTER — Other Ambulatory Visit: Payer: Self-pay

## 2019-11-09 MED ORDER — TICAGRELOR 60 MG PO TABS: 60.0000 mg | ORAL_TABLET | Freq: Two times a day (BID) | ORAL | 3 refills | Status: DC

## 2019-11-09 NOTE — Telephone Encounter (Signed)
This is Dr. Harding's pt. °

## 2019-11-09 NOTE — Telephone Encounter (Signed)
Rx request sent to pharmacy.  

## 2019-11-12 ENCOUNTER — Other Ambulatory Visit: Payer: Self-pay

## 2019-11-12 MED ORDER — FUROSEMIDE 20 MG PO TABS: 20.0000 mg | ORAL_TABLET | Freq: Every day | ORAL | 1 refills | Status: DC

## 2019-11-18 ENCOUNTER — Other Ambulatory Visit: Payer: Self-pay

## 2019-11-18 MED ORDER — INSULIN ASPART 100 UNIT/ML ~~LOC~~ SOLN: SUBCUTANEOUS | 0 refills | Status: DC

## 2019-11-19 ENCOUNTER — Ambulatory Visit: Payer: Medicare PPO | Admitting: Cardiology

## 2019-11-23 ENCOUNTER — Ambulatory Visit: Payer: Medicare PPO | Admitting: Cardiology

## 2019-11-27 DIAGNOSIS — Z85828 Personal history of other malignant neoplasm of skin: Secondary | ICD-10-CM | POA: Diagnosis not present

## 2019-11-27 DIAGNOSIS — Z08 Encounter for follow-up examination after completed treatment for malignant neoplasm: Secondary | ICD-10-CM | POA: Diagnosis not present

## 2019-12-03 DIAGNOSIS — Z961 Presence of intraocular lens: Secondary | ICD-10-CM | POA: Diagnosis not present

## 2019-12-03 DIAGNOSIS — H35371 Puckering of macula, right eye: Secondary | ICD-10-CM | POA: Diagnosis not present

## 2019-12-03 DIAGNOSIS — H43813 Vitreous degeneration, bilateral: Secondary | ICD-10-CM | POA: Diagnosis not present

## 2019-12-03 DIAGNOSIS — E113293 Type 2 diabetes mellitus with mild nonproliferative diabetic retinopathy without macular edema, bilateral: Secondary | ICD-10-CM | POA: Diagnosis not present

## 2019-12-03 LAB — HM DIABETES EYE EXAM

## 2019-12-04 ENCOUNTER — Other Ambulatory Visit: Payer: Self-pay | Admitting: Endocrinology

## 2019-12-12 ENCOUNTER — Other Ambulatory Visit: Payer: Self-pay | Admitting: Endocrinology

## 2019-12-14 ENCOUNTER — Encounter: Payer: Self-pay | Admitting: Cardiology

## 2019-12-14 ENCOUNTER — Other Ambulatory Visit: Payer: Self-pay

## 2019-12-14 ENCOUNTER — Ambulatory Visit: Payer: Medicare PPO | Admitting: Cardiology

## 2019-12-14 VITALS — BP 130/70 | HR 67 | Temp 96.6°F | Ht 69.5 in | Wt 180.4 lb

## 2019-12-14 DIAGNOSIS — I25119 Atherosclerotic heart disease of native coronary artery with unspecified angina pectoris: Secondary | ICD-10-CM

## 2019-12-14 DIAGNOSIS — E1169 Type 2 diabetes mellitus with other specified complication: Secondary | ICD-10-CM

## 2019-12-14 DIAGNOSIS — N184 Chronic kidney disease, stage 4 (severe): Secondary | ICD-10-CM | POA: Diagnosis not present

## 2019-12-14 DIAGNOSIS — I10 Essential (primary) hypertension: Secondary | ICD-10-CM | POA: Diagnosis not present

## 2019-12-14 DIAGNOSIS — R2681 Unsteadiness on feet: Secondary | ICD-10-CM | POA: Diagnosis not present

## 2019-12-14 DIAGNOSIS — E785 Hyperlipidemia, unspecified: Secondary | ICD-10-CM

## 2019-12-14 MED ORDER — FUROSEMIDE 20 MG PO TABS: 20.0000 mg | ORAL_TABLET | Freq: Two times a day (BID) | ORAL | 3 refills | Status: AC

## 2019-12-14 NOTE — Assessment & Plan Note (Signed)
Blood pressure continues to be well controlled with furosemide and Imdur.   Recommended taking second dose of furosemide at lunchtime as opposed to dinnertime.  Allowing for mild permissive hypertension because of somewhat unsteady gait.

## 2019-12-14 NOTE — Assessment & Plan Note (Signed)
Currently using a borrowed walker.  Unfortunately the adjustment for height does not work.  Plan: Prescription for rolling walker with adjustable height

## 2019-12-14 NOTE — Progress Notes (Signed)
Primary Care Provider: Lujean Amel, MD Cardiologist: Glenetta Hew, MD Electrophysiologist: None  Clinic Note: Chief Complaint  Patient presents with  . Follow-up    6 months  . Coronary Artery Disease    inferior STEMI November 2019 (RCA PCI) -no angina    HPI:    Clayton [name] is a 86 y.o. male with a PMH notable for CAD with inferior STEMI November 2019 (RCA PCI) who presents today for 35-month follow-up.  Clayton [name] was last seen on May 21, 2019.  Still walking a mile a day with his walker and a slow steady pace.  No complaints of angina.  Called EMS 1 time for chest pain that lasted from 8 PM to 5 AM.  Did not go to ER.  Recent Hospitalizations: none  Reviewed  CV studies:    The following studies were reviewed today: (if available, images/films reviewed: From Epic Chart or Care Everywhere) . none:  Interval History:   Clayton [name] returns today with his daughter overall doing very well.  He really has no complaints.  No sensation of chest tightness or pressure with rest or exertion.  He still does his routine walking, using his rolling walker.  He would like to have his own walker, they are currently borrowing one from a friend. With his walk, he is able to do fine, but otherwise his gait is pretty unsteady.  He tried to walk a little bit without his walker and fell and scraped his arm a few weeks ago.  He clearly does not walk very hard, but has no exertional dyspnea with what he does.  No PND, or orthopnea, and his swelling is usually pretty well controlled taking his Lasix twice a day.  Unfortunately this keeps him up late at night.  (I explained to him, but he should take a second dose at lunchtime instead of dinnertime).  No myalgias or arthralgias.  CV Review of Symptoms (Summary) Cardiovascular ROS: no chest pain or dyspnea on exertion positive for - orthopnea and Short of breath with exertion if he overexerts.  Sleeps in a chair, more because of  comfort than anything else, but may have some orthopnea, (while traveling was able to sleep in the bed) otherwise stable.  Edema well controlled as long as he takes his Lasix. negative for - paroxysmal nocturnal dyspnea, rapid heart rate, shortness of breath or Syncope/near syncope, TIA/amaurosis fugax, claudication  The patient does not have symptoms concerning for COVID-19 infection (fever, chills, cough, or new shortness of breath).  The patient is practicing social distancing & Masking.   COVID-19 vaccine second dose July 22, 2019  REVIEWED OF SYSTEMS   Review of Systems  Constitutional: Negative for malaise/fatigue.  HENT: Positive for hearing loss. Negative for congestion and nosebleeds.   Respiratory: Negative for shortness of breath.   Gastrointestinal: Negative for abdominal pain, blood in stool and melena.  Genitourinary: Negative for hematuria.  Musculoskeletal: Positive for back pain and joint pain. Negative for falls.       Somewhat unsteady, unbalanced gait  Neurological: Negative for focal weakness and weakness.  Endo/Heme/Allergies: Does not bruise/bleed easily.  Psychiatric/Behavioral: Positive for memory loss (Minor). Negative for depression. The patient is not nervous/anxious and does not have insomnia.        Although he can get little anxious at times   I have reviewed and (if needed) personally updated the patient's problem list, medications, allergies, past medical and surgical history, social and family history.  PAST MEDICAL HISTORY   Past Medical History:  Diagnosis Date  . Anemia   . BPH (benign prostatic hyperplasia)   . CAD (coronary artery disease)    hx of stemi  04-2018   . Chronic renal failure   . Chronic renal insufficiency   . COPD (chronic obstructive pulmonary disease) ([place])   . DDD (degenerative disc disease), lumbar   . Diabetes mellitus   . Hyperlipidemia   . Hyperparathyroidism ([place])   . Hypertension   . IDA (iron deficiency  anemia)   . Neuromuscular disorder ([place])   . OSA on CPAP   . Osteomyelitis of forearm ([place])   . Osteoporosis     PAST SURGICAL HISTORY   Past Surgical History:  Procedure Laterality Date  . CORONARY STENT INTERVENTION N/A 04/20/2018   Procedure: CORONARY STENT INTERVENTION;  Surgeon: Leonie Man, MD;  Location: [facility] INVASIVE CV LAB;;;    . [place] ACUTE MI REVASCULARIZATION N/A 04/20/2018   Procedure: Coronary/Graft Acute MI Revascularization;  Surgeon: Leonie Man, MD;  Location: [place] CV LAB;  Service: Cardiovascular;  Laterality: N/A; --> after initial PTCA restoring flow down the RCA, there was diffuse proximal to mid disease treated with a DES SYNERGY 3 X 38 ( 3.61mm)  . EYE SURGERY    . HOLTER MONITOR  05/2018   Sinus rhythm noted with sinus bradycardia.  Also sinus rhythm with 2-1 AV block noted.  Maximum heart rate was sinus tachycardia 123 bpm.  Rare PVCs accelerated idioventricular rhythm noted.  Intermittent Wenckebach block noted.  . I & D EXTREMITY Right 08/13/2016   Procedure: IRRIGATION AND DEBRIDEMENT RIGHT ELBOW AND HAND;  Surgeon: Milly Jakob, MD;  Location: [facility] ORS;  Service: Orthopedics;  Laterality: Right;  . LEFT HEART CATH AND CORONARY ANGIOGRAPHY N/A 04/20/2018   Procedure: LEFT HEART CATH AND CORONARY ANGIOGRAPHY;  Surgeon: Leonie Man, MD;  Location: [place] CV LAB; mRCA 100%&80% (after 55% tapering in pRCA), mLM-ostLAD 40% w/ 85% ostCx, ost-pCx 65% @ Om1 w/ ost 85%. pLAD 60% & 50% after D1 with distal 75%, ostD1 65% & 90% after small side branch.    . TRANSTHORACIC ECHOCARDIOGRAM  04/21/2018   EF 50-55%. Posterior Wall HK. Severe RV HK, dilated IVC. Temp wire in place  . VASECTOMY    . VIDEO BRONCHOSCOPY Bilateral 08/25/2012   Procedure: VIDEO BRONCHOSCOPY WITHOUT FLUORO;  Surgeon: Brand Males, MD;  Location: [place];  Service: Cardiopulmonary;  Laterality: Bilateral;   Cath-PCI 04/2018      MEDICATIONS/ALLERGIES     Current Meds  Medication Sig  . ACCU-CHEK SOFTCLIX LANCETS lancets TEST BLOOD SUGAR FOUR TIMES DAILY  . albuterol (PROVENTIL HFA;VENTOLIN HFA) 108 (90 Base) MCG/ACT inhaler Inhale 1-2 puffs into the lungs every 6 (six) hours as needed for wheezing or shortness of breath.  . Alcohol Swabs (B-D SINGLE USE SWABS REGULAR) PADS USE 7 SWABS DAILY AS DIRECTED  . atorvastatin (LIPITOR) 80 MG tablet Take 0.5 tablets (40 mg total) by mouth daily.  . benzonatate (TESSALON) 100 MG capsule Take by mouth 3 (three) times daily as needed for cough.  . budesonide-formoterol (SYMBICORT) 160-4.5 MCG/ACT inhaler Inhale 2 puffs into the lungs 2 (two) times daily.  . cholecalciferol (VITAMIN D) 1000 units tablet Take 2,000 Units by mouth daily.  . clindamycin (CLEOCIN) 150 MG capsule   . Continuous Blood Gluc Receiver (FREESTYLE LIBRE 14 DAY READER) DEVI 1 each by Does not apply route every 14 (fourteen) days. Use reader to monitor blood  sugar continuously with freestyle libre sensor.  . Continuous Blood Gluc Sensor (FREESTYLE LIBRE 14 DAY SENSOR) MISC USE EVERY 14 DAYS TO MONITOR BLOOD SUGARS CONTINUOUSLY  . DROPLET INSULIN SYRINGE 31G X 5/16" 0.3 ML MISC USE THREE TIMES DAILY  . finasteride (PROSCAR) 5 MG tablet TAKE 1 TABLET EVERY DAY  . furosemide (LASIX) 20 MG tablet Take 1 tablet (20 mg total) by mouth 2 (two) times daily. Take seocnd dose about 12 noon each daily  . glucose blood test strip Use as instructed to test blood sugars 4 times daily  . insulin aspart (NOVOLOG) 100 UNIT/ML injection Inject 8-10 units under the skin three times daily before meals.  . isosorbide mononitrate (IMDUR) 30 MG 24 hr tablet TAKE 1 TABLET BY MOUTH DAILY  . LANTUS 100 UNIT/ML injection INJECT  26  UNITS SUBCUTANEOUSLY EVERY MORNING (DISCARD VIAL 28 DAYS AFTER OPENING) (Patient taking differently: Inject 16 Units into the skin daily. )  . Melatonin 5 MG TABS Take 2 tablets by mouth at bedtime.   . montelukast (SINGULAIR) 10 MG  tablet   . Multiple Vitamin (MULTIVITAMIN WITH MINERALS) TABS tablet Take 1 tablet by mouth daily.  Marland Kitchen omeprazole (PRILOSEC) 20 MG capsule Take 1 capsule (20 mg total) by mouth daily.  Marland Kitchen oxybutynin (DITROPAN) 5 MG tablet   . ticagrelor (BRILINTA) 60 MG TABS tablet Take 1 tablet (60 mg total) by mouth 2 (two) times daily.  . [DISCONTINUED] furosemide (LASIX) 20 MG tablet Take 1 tablet (20 mg total) by mouth daily. (Patient taking differently: Take 20 mg by mouth 2 (two) times daily. Patient taking 1 tablet twice daily)  . [DISCONTINUED] levofloxacin (LEVAQUIN) 500 MG tablet Take 500 mg by mouth daily.    Allergies  Allergen Reactions  . Doxycycline Rash  . Levaquin [Levofloxacin In D5w]     Caused chest pain and heartburn  . Nitrous Oxide Other (See Comments)    Reaction:  Unknown   . Penicillins Other (See Comments)    Reaction:  Unknown Has patient had a PCN reaction causing immediate rash, facial/tongue/throat swelling, SOB or lightheadedness with hypotension: Unsure Has patient had a PCN reaction causing severe rash involving mucus membranes or skin necrosis: Unsure Has patient had a PCN reaction that required hospitalization Unsure  Has patient had a PCN reaction occurring within the last 10 years: No If all of the above answers are "NO", then may proceed with Cephalosporin use.  . Tape Other (See Comments)    Reaction:  Tears pts skin   . Diltiazem Rash    SOCIAL HISTORY/FAMILY HISTORY   Reviewed in Epic:  Pertinent findings: He still uses a rolling walker, but has a hard time because it is a borrowed walker that does not adjust to his height, he feels like he loses his balance a little bit because he is leaning forward.  He still does all of his ADLs including washing and bathing.  He walks anywhere from 3/4 up to 2-1/2 miles a day with his walker.  OBJCTIVE -PE, EKG, labs   Wt Readings from Last 3 Encounters:  12/14/19 180 lb 6.4 oz (81.8 kg)  09/10/19 183 lb (83 kg)    06/04/19 188 lb 9.6 oz (85.5 kg)    Physical Exam: BP 130/70   Pulse 67   Temp (!) 96.6 F (35.9 C)   Ht 5' 9.5" (1.765 m)   Wt 180 lb 6.4 oz (81.8 kg)   SpO2 96%   BMI 26.26 kg/m  Physical Exam  Constitutional:      General: He is not in acute distress.    Appearance: Normal appearance. He is normal weight.  HENT:     Head: Normocephalic and atraumatic.     Comments: Difficult to tell, but still has his scruffy beard but otherwise well-groomed; Very hard of hearing Cardiovascular:     Rate and Rhythm: Normal rate and regular rhythm.     Pulses: Normal pulses.     Heart sounds: No murmur heard.  No friction rub. No gallop.      Comments: Normal but distant heart sounds Pulmonary:     Effort: Pulmonary effort is normal. No respiratory distress.     Breath sounds: Normal breath sounds. No stridor. No wheezing or rales.  Abdominal:     General: Abdomen is flat. Bowel sounds are normal. There is no distension.     Palpations: Abdomen is soft.     Tenderness: There is no abdominal tenderness.  Musculoskeletal:        General: No swelling or deformity.  Neurological:     General: No focal deficit present.     Mental Status: He is alert and oriented to person, place, and time.  Psychiatric:        Mood and Affect: Mood normal.        Behavior: Behavior normal.        Thought Content: Thought content normal.     Adult ECG Report Not checked  Recent Labs:   September 2020: TC 102, TG 65, HDL 38, LDL C 51.   March 2021: A1c 6.2, Hgb 11.1.  CR 2.38.  K+ 4.3  Lab Results  Component Value Date   CHOL 108 05/09/2018   HDL 35.30 (L) 05/09/2018   LDLCALC 56 05/09/2018   TRIG 83.0 05/09/2018   CHOLHDL 3 05/09/2018   Lab Results  Component Value Date   CREATININE 2.25 (H) 06/04/2019   BUN 64 (H) 06/04/2019   NA 137 06/04/2019   K 4.2 06/04/2019   CL 103 06/04/2019   CO2 26 06/04/2019   Lab Results  Component Value Date   TSH 2.60 02/07/2018     ASSESSMENT/PLAN    Problem List Items Addressed This Visit    CAD, multiple vessel (Chronic)    Doing remarkably well.  He has had PCI of the occluded RCA and is having inferior STEMI along with moderate LAD disease and ostial circumflex disease along with diagonal stenosis.  Despite all these disease, she he is doing quite well walking anywhere from three quarters of a mile to 2 1/2 miles most days without any angina symptoms.  He is on stable medications, unable to tolerate ARB or beta-blocker because of bradycardia and hypotension in the past. He is able to tolerate low-dose Imdur. On statin He is on maintenance dose ticagrelor 60 mg twice daily which can be held 5-7 days preop for any surgeries or procedures.      Relevant Medications   furosemide (LASIX) 20 MG tablet   Hyperlipidemia associated with type 2 diabetes mellitus (HCC) (Chronic)   Essential hypertension - Primary (Chronic)    Blood pressure continues to be well controlled with furosemide and Imdur.   Recommended taking second dose of furosemide at lunchtime as opposed to dinnertime.  Allowing for mild permissive hypertension because of somewhat unsteady gait.      Relevant Medications   furosemide (LASIX) 20 MG tablet   Chronic kidney disease, stage IV (severe) (HCC) (Chronic)   Unsteady gait when  walking    Currently using a borrowed walker.  Unfortunately the adjustment for height does not work.  Plan: Prescription for rolling walker with adjustable height       Other Visit Diagnoses    Unsteady gait       Relevant Orders   For home use only DME 4 wheeled rolling walker with seat       COVID-19 Education: The signs and symptoms of COVID-19 were discussed with the patient and how to seek care for testing (follow up with PCP or arrange E-visit).   The importance of social distancing and COVID-19 vaccination was discussed today.  I spent a total of 57minutes with the patient. >  50% of the time was  spent in direct patient consultation.  Additional time spent with chart review  / charting (studies, outside notes, etc): 8 Total Time: 30 min   Current medicines are reviewed at length with the patient today.  (+/- concerns) n/a  Notice: This dictation was prepared with Dragon dictation along with smaller phrase technology. Any transcriptional errors that result from this process are unintentional and may not be corrected upon review.  Patient Instructions / Medication Changes & Studies & Tests Ordered   Patient Instructions  Medication Instructions:   take Furosemide at lunch time - the second dose of the day   *If you need a refill on your cardiac medications before your next appointment, please call your pharmacy*   Lab Work: Not needed If you have labs (blood work) drawn today and your tests are completely normal, you will receive your results only by: Marland Kitchen MyChart Message (if you have MyChart) OR . A paper copy in the mail If you have any lab test that is abnormal or we need to change your treatment, we will call you to review the results.   Testing/Procedures: Not needed   Follow-Up: At [facility], you and your health needs are our priority.  As part of our continuing mission to provide you with exceptional heart care, we have created designated Provider Care Teams.  These Care Teams include your primary Cardiologist (physician) and Advanced Practice Providers (APPs -  Physician Assistants and Nurse Practitioners) who all work together to provide you with the care you need, when you need it.  We recommend signing up for the patient portal called "MyChart".  Sign up information is provided on this After Visit Summary.  MyChart is used to connect with patients for Virtual Visits (Telemedicine).  Patients are able to view lab/test results, encounter notes, upcoming appointments, etc.  Non-urgent messages can be sent to your provider as well.   To learn more about what you can  do with MyChart, go to [email or web address].    Your next appointment:   12 month(s)  The format for your next appointment:   In Person  Provider:   Glenetta Hew, MD       Studies Ordered:   Orders Placed This Encounter  Procedures  . For home use only DME 4 wheeled rolling walker with seat     Glenetta Hew, M.D., M.S. Interventional Cardiologist   Pager # [phone] Phone # [phone] [address] [place], [place] [address]   Thank you for choosing Heartcare at [facility]!!

## 2019-12-14 NOTE — Assessment & Plan Note (Signed)
Doing remarkably well.  He has had PCI of the occluded RCA and is having inferior STEMI along with moderate LAD disease and ostial circumflex disease along with diagonal stenosis.  Despite all these disease, she he is doing quite well walking anywhere from three quarters of a mile to 2 1/2 miles most days without any angina symptoms.  He is on stable medications, unable to tolerate ARB or beta-blocker because of bradycardia and hypotension in the past. He is able to tolerate low-dose Imdur. On statin He is on maintenance dose ticagrelor 60 mg twice daily which can be held 5-7 days preop for any surgeries or procedures.

## 2019-12-14 NOTE — Assessment & Plan Note (Signed)
He has had sinus bradycardia and first-degree AV block in the past.  Hence no beta-blocker.

## 2019-12-14 NOTE — Patient Instructions (Signed)
Medication Instructions:   take Furosemide at lunch time - the second dose of the day   *If you need a refill on your cardiac medications before your next appointment, please call your pharmacy*   Lab Work: Not needed If you have labs (blood work) drawn today and your tests are completely normal, you will receive your results only by:  [place] (if you have MyChart) OR  A paper copy in the mail If you have any lab test that is abnormal or we need to change your treatment, we will call you to review the results.   Testing/Procedures: Not needed   Follow-Up: At [facility], you and your health needs are our priority.  As part of our continuing mission to provide you with exceptional heart care, we have created designated Provider Care Teams.  These Care Teams include your primary Cardiologist (physician) and Advanced Practice Providers (APPs -  Physician Assistants and Nurse Practitioners) who all work together to provide you with the care you need, when you need it.  We recommend signing up for the patient portal called "MyChart".  Sign up information is provided on this After Visit Summary.  MyChart is used to connect with patients for Virtual Visits (Telemedicine).  Patients are able to view lab/test results, encounter notes, upcoming appointments, etc.  Non-urgent messages can be sent to your provider as well.   To learn more about what you can do with MyChart, go to [email or web address].    Your next appointment:   12 month(s)  The format for your next appointment:   In Person  Provider:   Glenetta Hew, MD

## 2019-12-16 ENCOUNTER — Encounter: Payer: Self-pay | Admitting: Cardiology

## 2019-12-17 ENCOUNTER — Ambulatory Visit: Payer: Medicare PPO | Admitting: Endocrinology

## 2019-12-17 ENCOUNTER — Encounter: Payer: Self-pay | Admitting: Endocrinology

## 2019-12-17 ENCOUNTER — Other Ambulatory Visit: Payer: Self-pay

## 2019-12-17 VITALS — BP 124/58 | HR 87 | Ht 69.5 in | Wt 178.6 lb

## 2019-12-17 DIAGNOSIS — Z794 Long term (current) use of insulin: Secondary | ICD-10-CM | POA: Diagnosis not present

## 2019-12-17 DIAGNOSIS — E1165 Type 2 diabetes mellitus with hyperglycemia: Secondary | ICD-10-CM

## 2019-12-17 DIAGNOSIS — N1832 Chronic kidney disease, stage 3b: Secondary | ICD-10-CM

## 2019-12-17 DIAGNOSIS — R634 Abnormal weight loss: Secondary | ICD-10-CM | POA: Diagnosis not present

## 2019-12-17 LAB — POCT GLYCOSYLATED HEMOGLOBIN (HGB A1C): Hemoglobin A1C: 6.1 % — AB (ref 4.0–5.6)

## 2019-12-17 NOTE — Progress Notes (Signed)
Patient ID: Clayton [name], male   DOB: 1927-09-18, 86 y.o.   MRN: [id number]     Reason for Appointment: Followup of diabetes and various issues  History of Present Illness   Type 2 DIABETES MELITUS diagnosed 1989  He has had long-standing diabetes and has been on basal bolus insulin for the last few years Since 2014 he has required larger doses of insulin especially with having to take periodic courses of steroids for his pulmonary problems Also his Actos was stopped because of tendency to edema and metformin stopped because of renal dysfunction  Insulin regimen: Novolog 8-8-5 units before meals, Lantus 16 units in a.m., using syringes  A1c is now 6.2, however was 6.9 drawn by PCP earlier this month 6.7 and was drawn by PCP today   Current blood sugar patterns and problems identified:  His blood sugars are very well controlled with only minimal high or low excursions  He is currently not comparing his fingerstick to the [place] sensor readings, previously reportedly accurate  He is not taking less insulin to cover his evening meal but occasionally still has readings in the 60s after taking 5 units for dinnertime coverage  No overnight hypoglycemia and still taking relatively low doses of Lantus in the morning  He tries to not take as much insulin at lunchtime because he tries to walk afterwards  However he still depending on his diet he may have readings over 160-180 at times postprandially  Does not take extra insulin blood sugars are high premeal especially at lunch  He is usually very consistent with monitoring blood sugars throughout the day and entering the time when he is eating and taking insulin before meals He says he is walking more than 1 miles in the afternoons using his walker    Oral hypoglycemic drugs: None            CONTINUOUS GLUCOSE MONITORING RECORD INTERPRETATION    Dates of Recording: Last 2 weeks  Sensor description: Freestyle  libre  Results statistics:   CGM use % of time  99  Average and SD  123, GV 27  Time in range      93%  % Time Above 180 5  % Time above 250   % Time Below target 1    PRE-MEAL Fasting Lunch Dinner Bedtime Overall  Glucose range:       Mean/median:  108  113  107  124  123   POST-MEAL PC Breakfast PC Lunch PC Dinner  Glucose range:     Mean/median:  124  167  129    Glycemic patterns summary: Previously his CGM had been relatively accurate compared to fingersticks Blood sugars show minimal variability and fairly stable throughout the day and night with tendency to hypoglycemia in the early afternoon only, usually below 180 at the peak  Hyperglycemic episodes occurring variably after lunch and only rarely any other time  Hypoglycemic episodes occurred only once midmorning with lowest reading 58 and also a couple of times low normal in the 60s around dinner time  Overnight periods: Blood sugars are relatively stable and near normal with slight dawn phenomenon increase after 4-5 AM  Preprandial periods: Blood sugars excellent before meals at all times with slightly lower readings at dinnertime and variably high before lunchtime  Postprandial periods:   After breakfast: Blood sugars are generally very evenly controlled    After lunch: He has a modest rise in  blood sugar which is variable and averaging about 170 After dinner: Blood sugars are usually mildly increased or occasionally low normal right after eating  Previous data    CGM use % of time  98  Average and SD  120, GV 25  Time in range      91%  % Time Above 180  3  % Time above 250   % Time Below target  6    PRE-MEAL Fasting Lunch Dinner Bedtime Overall  Glucose range:       Mean/median:  137  100  109   120   POST-MEAL PC Breakfast PC Lunch PC Dinner  Glucose range:     Mean/median:  140  123  120      Meals: 3 meals per day.  breakfast usually about 5 AM: oatmeal / grits, with peanut butter, for  lunch he will have a sandwich with apple around 11 AM  Dinner usually at 5 PM-6 PM       Weight history:  Wt Readings from Last 3 Encounters:  12/17/19 178 lb 9.6 oz (81 kg)  12/14/19 180 lb 6.4 oz (81.8 kg)  09/10/19 183 lb (83 kg)   LABS:  Lab Results  Component Value Date   HGBA1C 6.1 (A) 12/17/2019   HGBA1C 6.2 (A) 09/10/2019   HGBA1C 5.5 06/04/2019   Lab Results  Component Value Date   MICROALBUR 5.4 (H) 08/07/2018   LDLCALC 56 05/09/2018   CREATININE 2.25 (H) 06/04/2019   Problem 2:  Chronic kidney disease:  He has had chronic increase in creatinine, was 1.7 in 2014 with slow progression subsequently. This has been related to nephropathy and glomerulosclerosis Has been evaluated and followed by nephrologist Renal ultrasound did not show any abnormality  Creat 2.38 in March   Lab Results  Component Value Date   CREATININE 2.25 (H) 06/04/2019   CREATININE 2.50 (H) 08/07/2018   CREATININE 2.44 (H) 06/13/2018   CREATININE 2.57 (H) 05/09/2018   He has had a normal PTH, not on calcitriol and only on vitamin D   Lab Results  Component Value Date   PTH 30 06/04/2019   CALCIUM 10.0 06/04/2019   CAION 1.18 04/20/2018   PHOS 2.3 04/30/2014      Allergies as of 12/17/2019      Reactions   Doxycycline Rash   Levaquin [levofloxacin In D5w]    Caused chest pain and heartburn   Nitrous Oxide Other (See Comments)   Reaction:  Unknown    Penicillins Other (See Comments)   Reaction:  Unknown Has patient had a PCN reaction causing immediate rash, facial/tongue/throat swelling, SOB or lightheadedness with hypotension: Unsure Has patient had a PCN reaction causing severe rash involving mucus membranes or skin necrosis: Unsure Has patient had a PCN reaction that required hospitalization Unsure  Has patient had a PCN reaction occurring within the last 10 years: No If all of the above answers are "NO", then may proceed with Cephalosporin use.   Tape Other (See  Comments)   Reaction:  Tears pts skin    Diltiazem Rash      Medication List       Accurate as of December 17, 2019  4:19 PM. If you have any questions, ask your nurse or doctor.        Accu-Chek Softclix Lancets lancets TEST BLOOD SUGAR FOUR TIMES DAILY   albuterol 108 (90 Base) MCG/ACT inhaler Commonly known as: VENTOLIN HFA Inhale 1-2 puffs into the lungs every  6 (six) hours as needed for wheezing or shortness of breath.   atorvastatin 80 MG tablet Commonly known as: LIPITOR Take 0.5 tablets (40 mg total) by mouth daily.   B-D SINGLE USE SWABS REGULAR Pads USE 7 SWABS DAILY AS DIRECTED   benzonatate 100 MG capsule Commonly known as: TESSALON Take by mouth 3 (three) times daily as needed for cough.   budesonide-formoterol 160-4.5 MCG/ACT inhaler Commonly known as: Symbicort Inhale 2 puffs into the lungs 2 (two) times daily.   cholecalciferol 1000 units tablet Commonly known as: VITAMIN D Take 2,000 Units by mouth daily.   clindamycin 150 MG capsule Commonly known as: CLEOCIN   Droplet Insulin Syringe 31G X 5/16" 0.3 ML Misc Generic drug: Insulin Syringe-Needle U-100 USE THREE TIMES DAILY   finasteride 5 MG tablet Commonly known as: PROSCAR TAKE 1 TABLET EVERY DAY   FreeStyle Libre 14 Day Reader Kerrin Mo 1 each by Does not apply route every 14 (fourteen) days. Use reader to monitor blood sugar continuously with freestyle libre sensor.   FreeStyle Libre 14 Day Sensor Misc USE EVERY 14 DAYS TO MONITOR BLOOD SUGARS CONTINUOUSLY   furosemide 20 MG tablet Commonly known as: LASIX Take 1 tablet (20 mg total) by mouth 2 (two) times daily. Take seocnd dose about 12 noon each daily   glucose blood test strip Use as instructed to test blood sugars 4 times daily   insulin aspart 100 UNIT/ML injection Commonly known as: novoLOG Inject 8-10 units under the skin three times daily before meals.   isosorbide mononitrate 30 MG 24 hr tablet Commonly known as: IMDUR TAKE 1  TABLET BY MOUTH DAILY   Lantus 100 UNIT/ML injection Generic drug: insulin glargine INJECT  26  UNITS SUBCUTANEOUSLY EVERY MORNING (DISCARD VIAL 28 DAYS AFTER OPENING) What changed: See the new instructions.   melatonin 5 MG Tabs Take 2 tablets by mouth at bedtime.   montelukast 10 MG tablet Commonly known as: SINGULAIR   multivitamin with minerals Tabs tablet Take 1 tablet by mouth daily.   nitroGLYCERIN 0.4 MG SL tablet Commonly known as: NITROSTAT Place 1 tablet (0.4 mg total) under the tongue every 5 (five) minutes as needed for chest pain.   omeprazole 20 MG capsule Commonly known as: PRILOSEC Take 1 capsule (20 mg total) by mouth daily.   oxybutynin 5 MG tablet Commonly known as: DITROPAN   ticagrelor 60 MG Tabs tablet Commonly known as: Brilinta Take 1 tablet (60 mg total) by mouth 2 (two) times daily.       Allergies:  Allergies  Allergen Reactions  . Doxycycline Rash  . Levaquin [Levofloxacin In D5w]     Caused chest pain and heartburn  . Nitrous Oxide Other (See Comments)    Reaction:  Unknown   . Penicillins Other (See Comments)    Reaction:  Unknown Has patient had a PCN reaction causing immediate rash, facial/tongue/throat swelling, SOB or lightheadedness with hypotension: Unsure Has patient had a PCN reaction causing severe rash involving mucus membranes or skin necrosis: Unsure Has patient had a PCN reaction that required hospitalization Unsure  Has patient had a PCN reaction occurring within the last 10 years: No If all of the above answers are "NO", then may proceed with Cephalosporin use.  . Tape Other (See Comments)    Reaction:  Tears pts skin   . Diltiazem Rash    Past Medical History:  Diagnosis Date  . Anemia   . BPH (benign prostatic hyperplasia)   . CAD (coronary  artery disease)    hx of stemi  04-2018   . Chronic renal failure   . Chronic renal insufficiency   . COPD (chronic obstructive pulmonary disease) ([place])   . DDD  (degenerative disc disease), lumbar   . Diabetes mellitus   . Hyperlipidemia   . Hyperparathyroidism ([place])   . Hypertension   . IDA (iron deficiency anemia)   . Neuromuscular disorder ([place])   . OSA on CPAP   . Osteomyelitis of forearm ([place])   . Osteoporosis     Past Surgical History:  Procedure Laterality Date  . CORONARY STENT INTERVENTION N/A 04/20/2018   Procedure: CORONARY STENT INTERVENTION;  Surgeon: Leonie Man, MD;  Location: [facility] INVASIVE CV LAB;;;    . [facility] [place] ACUTE MI REVASCULARIZATION N/A 04/20/2018   Procedure: Coronary/Graft Acute MI Revascularization;  Surgeon: Leonie Man, MD;  Location: [facility] [place] CV LAB;  Service: Cardiovascular;  Laterality: N/A; --> after initial PTCA restoring flow down the RCA, there was diffuse proximal to mid disease treated with a DES SYNERGY 3 X 38 ( 3.33mm)  . EYE SURGERY    . HOLTER MONITOR  05/2018   Sinus rhythm noted with sinus bradycardia.  Also sinus rhythm with 2-1 AV block noted.  Maximum heart rate was sinus tachycardia 123 bpm.  Rare PVCs accelerated idioventricular rhythm noted.  Intermittent Wenckebach block noted.  . I & D EXTREMITY Right 08/13/2016   Procedure: IRRIGATION AND DEBRIDEMENT RIGHT ELBOW AND HAND;  Surgeon: Milly Jakob, MD;  Location: [facility] ORS;  Service: Orthopedics;  Laterality: Right;  . LEFT HEART CATH AND CORONARY ANGIOGRAPHY N/A 04/20/2018   Procedure: LEFT HEART CATH AND CORONARY ANGIOGRAPHY;  Surgeon: Leonie Man, MD;  Location: [place] CV LAB; mRCA 100%&80% (after 55% tapering in pRCA), mLM-ostLAD 40% w/ 85% ostCx, ost-pCx 65% @ Om1 w/ ost 85%. pLAD 60% & 50% after D1 with distal 75%, ostD1 65% & 90% after small side branch.    . TRANSTHORACIC ECHOCARDIOGRAM  04/21/2018   EF 50-55%. Posterior Wall HK. Severe RV HK, dilated IVC. Temp wire in place  . VASECTOMY    . VIDEO BRONCHOSCOPY Bilateral 08/25/2012   Procedure: VIDEO BRONCHOSCOPY WITHOUT FLUORO;  Surgeon: Brand Males, MD;   Location: [facility];  Service: Cardiopulmonary;  Laterality: Bilateral;    Family History  Problem Relation Age of Onset  . Diabetes Mellitus II Other   . Hypertension Other   . Other Father        complications from strep  . CVA Father   . Cancer Sister   . Other Brother        hip replacement complications     Social History:  reports that he quit smoking about 46 years ago. His smoking use included cigarettes. He has a 99.00 pack-year smoking history. He quit smokeless tobacco use about 44 years ago. He reports that he does not drink alcohol and does not use drugs.  Review of Systems  BLOOD PRESSURE:  He is not on any treatment for high blood pressure for several years  BP Readings from Last 3 Encounters:  12/17/19 (!) 124/58  12/14/19 130/70  09/10/19 122/60   Taking Lasix for edema, now taking 20 mg twice daily from nephrologist   ANEMIA:   He has a  history of anemia related to renal dysfunction and mild iron deficiency  Has been treated with iron infusions previously   At times has had low iron saturation, recent labs not available from nephrologist  Lab Results  Component Value Date   WBC 12.7 (H) 08/07/2018   HGB 9.7 (L) 08/07/2018   HCT 29.5 (L) 08/07/2018   MCV 96.1 08/07/2018   PLT 413.0 (H) 08/07/2018   Lab Results  Component Value Date   IRON 25 (L) 06/04/2019   TIBC 230 (L) 04/23/2018   FERRITIN 66.2 08/07/2018    He has had sleep apnea, using CPAP and this is being adjusted by pulmonologist.    He has had chronic lower urinary tract problems and BPH followed by urologist   HYPERLIPIDEMIA:     He is on atorvastatin since his MI prescribed by cardiologist, last LDL in 9/20 was 51  Lab Results  Component Value Date   CHOL 108 05/09/2018   HDL 35.30 (L) 05/09/2018   LDLCALC 56 05/09/2018   TRIG 83.0 05/09/2018   CHOLHDL 3 05/09/2018   Lab Results  Component Value Date   ALT 21 06/04/2019        Examination:   BP (!) 124/58  (BP Location: Right Arm, Patient Position: Sitting, Cuff Size: Normal)   Pulse 87   Ht 5' 9.5" (1.765 m)   Wt 178 lb 9.6 oz (81 kg)   SpO2 95%   BMI 26.00 kg/m   Body mass index is 26 kg/m.     Assesment/PLAN:   1. Diabetes type 2 with obesity treated with basal bolus insulin  See history of present illness for detailed discussion of current diabetes management, blood sugar patterns and problems identifie  A1c is excellent at 6.1 and stable  Overall blood sugars as judged by his CGM data is stable as discussed above Has minimal hypoglycemia and overnight readings are well controlled with the morning dose of Lantus He does tend to have some high readings after lunch but not consistently and on an average still relatively good Discussed that he can adjust his lunchtime dose based on how many carbohydrates he is eating since he does not tend to have low sugars with his walking after lunch Also if he has high readings Premeal at lunchtime he can take extra 2 units  However he is tending to be likely taking too much insulin for his dinner coverage Most likely since readings are somewhat lower after dinner he can cut down his coverage to 3-4 units based on his Premeal blood sugar and what he is eating    2.   Chronic kidney disease: His creatinine recently is about 2.4 and stable Discussed needing to follow-up with a nephrologist for various systemic problems arising from his CKD   3. History of iron deficiency and anemia: To follow-up with nephrologist and consider iron infusion if needed  4.  Weight loss: He has had mild weight loss but he thinks his appetite is normal.  To continue follow-up with his PCP also  5.  Mild hypercholesterolemia: Followed by PCP and usually has LDL below 70  There are no Patient Instructions on file for this visit.   Follow-up in 4 months   Elayne Snare  12/17/2019, 4:19 PM      q

## 2019-12-17 NOTE — Patient Instructions (Signed)
Novolog 4 at supper  If sugar > 180 before lunch add 2 more Novolog

## 2019-12-18 DIAGNOSIS — R350 Frequency of micturition: Secondary | ICD-10-CM | POA: Diagnosis not present

## 2019-12-18 DIAGNOSIS — R3912 Poor urinary stream: Secondary | ICD-10-CM | POA: Diagnosis not present

## 2019-12-18 DIAGNOSIS — R351 Nocturia: Secondary | ICD-10-CM | POA: Diagnosis not present

## 2019-12-22 ENCOUNTER — Telehealth: Payer: Self-pay | Admitting: Cardiology

## 2019-12-22 NOTE — Telephone Encounter (Signed)
Patient calling requesting his office summary from his last appointment be mailed to him, because it was lost.

## 2019-12-22 NOTE — Telephone Encounter (Signed)
Patient aware  After summary visit will be mailed

## 2020-01-04 DIAGNOSIS — L03115 Cellulitis of right lower limb: Secondary | ICD-10-CM | POA: Diagnosis not present

## 2020-01-11 DIAGNOSIS — L309 Dermatitis, unspecified: Secondary | ICD-10-CM | POA: Diagnosis not present

## 2020-01-28 DIAGNOSIS — J479 Bronchiectasis, uncomplicated: Secondary | ICD-10-CM | POA: Diagnosis not present

## 2020-01-28 DIAGNOSIS — G4733 Obstructive sleep apnea (adult) (pediatric): Secondary | ICD-10-CM | POA: Diagnosis not present

## 2020-01-28 DIAGNOSIS — R911 Solitary pulmonary nodule: Secondary | ICD-10-CM | POA: Diagnosis not present

## 2020-01-28 DIAGNOSIS — J441 Chronic obstructive pulmonary disease with (acute) exacerbation: Secondary | ICD-10-CM | POA: Diagnosis not present

## 2020-03-02 ENCOUNTER — Other Ambulatory Visit: Payer: Self-pay | Admitting: Endocrinology

## 2020-03-08 ENCOUNTER — Other Ambulatory Visit: Payer: Self-pay | Admitting: Endocrinology

## 2020-03-09 DIAGNOSIS — D631 Anemia in chronic kidney disease: Secondary | ICD-10-CM | POA: Diagnosis not present

## 2020-03-09 DIAGNOSIS — E1121 Type 2 diabetes mellitus with diabetic nephropathy: Secondary | ICD-10-CM | POA: Diagnosis not present

## 2020-03-09 DIAGNOSIS — E785 Hyperlipidemia, unspecified: Secondary | ICD-10-CM | POA: Diagnosis not present

## 2020-03-09 DIAGNOSIS — N184 Chronic kidney disease, stage 4 (severe): Secondary | ICD-10-CM | POA: Diagnosis not present

## 2020-03-09 DIAGNOSIS — Z79899 Other long term (current) drug therapy: Secondary | ICD-10-CM | POA: Diagnosis not present

## 2020-03-09 LAB — BASIC METABOLIC PANEL: Glucose: 146

## 2020-03-09 LAB — LIPID PANEL
Cholesterol: 106 (ref 0–200)
HDL: 41 (ref 35–70)
LDL Cholesterol: 49
LDl/HDL Ratio: 2.6
Triglycerides: 79 (ref 40–160)

## 2020-03-10 DIAGNOSIS — E1121 Type 2 diabetes mellitus with diabetic nephropathy: Secondary | ICD-10-CM | POA: Diagnosis not present

## 2020-03-10 DIAGNOSIS — N1832 Chronic kidney disease, stage 3b: Secondary | ICD-10-CM | POA: Diagnosis not present

## 2020-03-10 DIAGNOSIS — D631 Anemia in chronic kidney disease: Secondary | ICD-10-CM | POA: Diagnosis not present

## 2020-03-10 DIAGNOSIS — Z23 Encounter for immunization: Secondary | ICD-10-CM | POA: Diagnosis not present

## 2020-03-10 DIAGNOSIS — Z0001 Encounter for general adult medical examination with abnormal findings: Secondary | ICD-10-CM | POA: Diagnosis not present

## 2020-03-10 DIAGNOSIS — J449 Chronic obstructive pulmonary disease, unspecified: Secondary | ICD-10-CM | POA: Diagnosis not present

## 2020-03-10 DIAGNOSIS — E7800 Pure hypercholesterolemia, unspecified: Secondary | ICD-10-CM | POA: Diagnosis not present

## 2020-03-10 DIAGNOSIS — I2510 Atherosclerotic heart disease of native coronary artery without angina pectoris: Secondary | ICD-10-CM | POA: Diagnosis not present

## 2020-03-10 DIAGNOSIS — I10 Essential (primary) hypertension: Secondary | ICD-10-CM | POA: Diagnosis not present

## 2020-03-11 ENCOUNTER — Other Ambulatory Visit: Payer: Self-pay | Admitting: Cardiology

## 2020-03-15 ENCOUNTER — Telehealth: Payer: Self-pay | Admitting: Endocrinology

## 2020-03-15 NOTE — Telephone Encounter (Signed)
Patient called to advise that his new pharmacy of record should be the [facility] on [address]

## 2020-03-29 ENCOUNTER — Encounter: Payer: Self-pay | Admitting: *Deleted

## 2020-03-29 LAB — HEMOGLOBIN A1C: Hemoglobin A1C: 6.7

## 2020-04-08 DIAGNOSIS — L2081 Atopic neurodermatitis: Secondary | ICD-10-CM | POA: Diagnosis not present

## 2020-04-08 DIAGNOSIS — R600 Localized edema: Secondary | ICD-10-CM | POA: Diagnosis not present

## 2020-04-08 DIAGNOSIS — I8310 Varicose veins of unspecified lower extremity with inflammation: Secondary | ICD-10-CM | POA: Diagnosis not present

## 2020-04-12 ENCOUNTER — Other Ambulatory Visit: Payer: Self-pay | Admitting: Cardiology

## 2020-04-12 ENCOUNTER — Other Ambulatory Visit: Payer: Self-pay | Admitting: Physician Assistant

## 2020-04-18 ENCOUNTER — Encounter: Payer: Self-pay | Admitting: Endocrinology

## 2020-04-18 ENCOUNTER — Ambulatory Visit: Payer: Medicare PPO | Admitting: Endocrinology

## 2020-04-18 ENCOUNTER — Other Ambulatory Visit: Payer: Self-pay

## 2020-04-18 VITALS — BP 118/58 | HR 79 | Ht 72.0 in | Wt 186.8 lb

## 2020-04-18 DIAGNOSIS — R600 Localized edema: Secondary | ICD-10-CM

## 2020-04-18 DIAGNOSIS — Z794 Long term (current) use of insulin: Secondary | ICD-10-CM | POA: Diagnosis not present

## 2020-04-18 DIAGNOSIS — E1165 Type 2 diabetes mellitus with hyperglycemia: Secondary | ICD-10-CM | POA: Diagnosis not present

## 2020-04-18 DIAGNOSIS — N184 Chronic kidney disease, stage 4 (severe): Secondary | ICD-10-CM | POA: Diagnosis not present

## 2020-04-18 NOTE — Progress Notes (Signed)
Patient ID: Clayton [name], male   DOB: May 23, 1928, 86 y.o.   MRN: [id number]     Reason for Appointment: Followup of diabetes and various issues  History of Present Illness   Type 2 DIABETES MELITUS diagnosed 1989  He has had long-standing diabetes and has been on basal bolus insulin for the last few years Since 2014 he has required larger doses of insulin especially with having to take periodic courses of steroids for his pulmonary problems Also his Actos was stopped because of tendency to edema and metformin stopped because of renal dysfunction  Insulin regimen: Novolog 8-8-5 units before meals, Lantus 16 units in a.m., using syringes  A1c is now 6.7   Current blood sugar patterns and problems identified:  Currently putting his sensor on his lower leg instead of the upper arm  He is still not comparing his fingerstick to the [place] sensor readings despite reminders  Today blood sugar in the office was 117 and on his sensor was 101  His blood sugar patterns on the sensor are very inconsistent with frequent and marked hypoglycemia on a daily basis this was only in the first week of the 2-week download information  Likely his sensor was not accurate in the first week as he did not report symptoms of hypoglycemia most of the time  Interpretation of the download as follows  Generally blood sugars are tending to be low around 3 AM and 11 AM without consistent pattern  Hyperglycemia is present only mildly after breakfast and at times after dinner  As above he has had frequent blood sugars below 70 in the first week at all times but only twice in the last week overnight and before lunch and after lunch  Mealtime insulin: He is still taking the same insulin but appears to have taken an additional insulin occasionally after dinner blood sugars are relatively high which could cause low sugars during the night  Not clear why his blood sugars tend to be low before lunch, he  usually has peanut butter with his oatmeal in the morning  He is doing some walking in the afternoon and he will also have a snack with cheese and crackers  CGM use % of time  95  2-week average/SD  99  Time in range  72     %  % Time Above 180 2  % Time above 250   % Time Below 70  26     PRE-MEAL Fasting Lunch Dinner Bedtime Overall  Glucose range:       Averages:        POST-MEAL PC Breakfast PC Lunch PC Dinner  Glucose range:     Averages:       Previous data:      99  Average and SD  123, GV 27  Time in range      93%  % Time Above 180 5  % Time above 250   % Time Below targetCGM use % of time 1    PRE-MEAL Fasting Lunch Dinner Bedtime Overall  Glucose range:       Mean/median:  108  113  107  124  123   POST-MEAL PC Breakfast PC Lunch PC Dinner  Glucose range:     Mean/median:  124  167  129      Meals: 3 meals per day.  breakfast usually about 5 AM: oatmeal / grits, with peanut butter, for lunch he  will have a sandwich with apple around 11 AM  Dinner usually at 5 PM-6 PM       Weight history:  Wt Readings from Last 3 Encounters:  04/18/20 186 lb 12.8 oz (84.7 kg)  12/17/19 178 lb 9.6 oz (81 kg)  12/14/19 180 lb 6.4 oz (81.8 kg)   LABS:  Lab Results  Component Value Date   HGBA1C 6.7 03/29/2020   HGBA1C 6.1 (A) 12/17/2019   HGBA1C 6.2 (A) 09/10/2019   Lab Results  Component Value Date   MICROALBUR 5.4 (H) 08/07/2018   LDLCALC 49 03/09/2020   CREATININE 2.25 (H) 06/04/2019   Problem 2:  Chronic kidney disease:  He has had chronic increase in creatinine, was 1.7 in 2014 with slow progression subsequently. This has been related to nephropathy and glomerulosclerosis Has been evaluated and followed by nephrologist Renal ultrasound did not show any abnormality  Creat 2.4 in 9/21    Lab Results  Component Value Date   CREATININE 2.25 (H) 06/04/2019   CREATININE 2.50 (H) 08/07/2018   CREATININE 2.44 (H) 06/13/2018   CREATININE 2.57  (H) 05/09/2018   He is not taking 20 mg twice daily of Lasix for edema which appears to be more   Allergies as of 04/18/2020      Reactions   Doxycycline Rash   Levaquin [levofloxacin In D5w]    Caused chest pain and heartburn   Nitrous Oxide Other (See Comments)   Reaction:  Unknown    Penicillins Other (See Comments)   Reaction:  Unknown Has patient had a PCN reaction causing immediate rash, facial/tongue/throat swelling, SOB or lightheadedness with hypotension: Unsure Has patient had a PCN reaction causing severe rash involving mucus membranes or skin necrosis: Unsure Has patient had a PCN reaction that required hospitalization Unsure  Has patient had a PCN reaction occurring within the last 10 years: No If all of the above answers are "NO", then may proceed with Cephalosporin use.   Tape Other (See Comments)   Reaction:  Tears pts skin    Diltiazem Rash      Medication List       Accurate as of April 18, 2020 11:49 AM. If you have any questions, ask your nurse or doctor.        Accu-Chek Softclix Lancets lancets TEST BLOOD SUGAR FOUR TIMES DAILY   albuterol 108 (90 Base) MCG/ACT inhaler Commonly known as: VENTOLIN HFA Inhale 1-2 puffs into the lungs every 6 (six) hours as needed for wheezing or shortness of breath.   atorvastatin 80 MG tablet Commonly known as: LIPITOR TAKE 1/2 TABLET(40 MG) BY MOUTH DAILY   B-D SINGLE USE SWABS REGULAR Pads USE 7 SWABS DAILY AS DIRECTED   benzonatate 100 MG capsule Commonly known as: TESSALON Take by mouth 3 (three) times daily as needed for cough.   Brilinta 60 MG Tabs tablet Generic drug: ticagrelor TAKE 1 TABLET(60 MG) BY MOUTH TWICE DAILY   budesonide-formoterol 160-4.5 MCG/ACT inhaler Commonly known as: Symbicort Inhale 2 puffs into the lungs 2 (two) times daily.   cholecalciferol 1000 units tablet Commonly known as: VITAMIN D Take 2,000 Units by mouth daily.   clindamycin 150 MG capsule Commonly known as:  CLEOCIN   Droplet Insulin Syringe 31G X 5/16" 0.3 ML Misc Generic drug: Insulin Syringe-Needle U-100 USE THREE TIMES DAILY   finasteride 5 MG tablet Commonly known as: PROSCAR TAKE 1 TABLET EVERY DAY   FreeStyle Libre 14 Day Reader Kerrin Mo 1 each by Does not apply  route every 14 (fourteen) days. Use reader to monitor blood sugar continuously with freestyle libre sensor.   FreeStyle Libre 14 Day Sensor Misc USE EVERY 14 DAYS TO MONITOR BLOOD SUGAR CONTINUOUSLY   furosemide 20 MG tablet Commonly known as: LASIX Take 1 tablet (20 mg total) by mouth 2 (two) times daily. Take seocnd dose about 12 noon each daily   glucose blood test strip Use as instructed to test blood sugars 4 times daily   insulin aspart 100 UNIT/ML injection Commonly known as: novoLOG Inject 8-10 units under the skin three times daily before meals.   isosorbide mononitrate 30 MG 24 hr tablet Commonly known as: IMDUR TAKE 1 TABLET BY MOUTH DAILY   Lantus 100 UNIT/ML injection Generic drug: insulin glargine INJECT  26  UNITS SUBCUTANEOUSLY EVERY MORNING (DISCARD VIAL 28 DAYS AFTER OPENING) What changed: See the new instructions.   melatonin 5 MG Tabs Take 2 tablets by mouth at bedtime.   montelukast 10 MG tablet Commonly known as: SINGULAIR   multivitamin with minerals Tabs tablet Take 1 tablet by mouth daily.   nitroGLYCERIN 0.4 MG SL tablet Commonly known as: NITROSTAT Place 1 tablet (0.4 mg total) under the tongue every 5 (five) minutes as needed for chest pain.   omeprazole 20 MG capsule Commonly known as: PRILOSEC Take 1 capsule (20 mg total) by mouth daily.   oxybutynin 5 MG tablet Commonly known as: DITROPAN       Allergies:  Allergies  Allergen Reactions   Doxycycline Rash   Levaquin [Levofloxacin In D5w]     Caused chest pain and heartburn   Nitrous Oxide Other (See Comments)    Reaction:  Unknown    Penicillins Other (See Comments)    Reaction:  Unknown Has patient had a  PCN reaction causing immediate rash, facial/tongue/throat swelling, SOB or lightheadedness with hypotension: Unsure Has patient had a PCN reaction causing severe rash involving mucus membranes or skin necrosis: Unsure Has patient had a PCN reaction that required hospitalization Unsure  Has patient had a PCN reaction occurring within the last 10 years: No If all of the above answers are "NO", then may proceed with Cephalosporin use.   Tape Other (See Comments)    Reaction:  Tears pts skin    Diltiazem Rash    Past Medical History:  Diagnosis Date   Anemia    BPH (benign prostatic hyperplasia)    CAD (coronary artery disease)    hx of stemi  04-2018    Chronic renal failure    Chronic renal insufficiency    COPD (chronic obstructive pulmonary disease) (HCC)    DDD (degenerative disc disease), lumbar    Diabetes mellitus    Hyperlipidemia    Hyperparathyroidism ([place])    Hypertension    IDA (iron deficiency anemia)    Neuromuscular disorder (HCC)    OSA on CPAP    Osteomyelitis of forearm ([place])    Osteoporosis     Past Surgical History:  Procedure Laterality Date   CORONARY STENT INTERVENTION N/A 04/20/2018   Procedure: CORONARY STENT INTERVENTION;  Surgeon: Leonie Man, MD;  Location: [facility] INVASIVE CV LAB;;;     CORONARY/GRAFT ACUTE MI REVASCULARIZATION N/A 04/20/2018   Procedure: Coronary/Graft Acute MI Revascularization;  Surgeon: Leonie Man, MD;  Location: [facility] CV LAB;  Service: Cardiovascular;  Laterality: N/A; --> after initial PTCA restoring flow down the RCA, there was diffuse proximal to mid disease treated with a DES SYNERGY 3 X 38 ( 3.55mm)  EYE SURGERY     HOLTER MONITOR  05/2018   Sinus rhythm noted with sinus bradycardia.  Also sinus rhythm with 2-1 AV block noted.  Maximum heart rate was sinus tachycardia 123 bpm.  Rare PVCs accelerated idioventricular rhythm noted.  Intermittent Wenckebach block noted.   I & D EXTREMITY Right  08/13/2016   Procedure: IRRIGATION AND DEBRIDEMENT RIGHT ELBOW AND HAND;  Surgeon: Milly Jakob, MD;  Location: [facility] ORS;  Service: Orthopedics;  Laterality: Right;   LEFT HEART CATH AND CORONARY ANGIOGRAPHY N/A 04/20/2018   Procedure: LEFT HEART CATH AND CORONARY ANGIOGRAPHY;  Surgeon: Leonie Man, MD;  Location: [facility] CV LAB; mRCA 100%&80% (after 55% tapering in pRCA), mLM-ostLAD 40% w/ 85% ostCx, ost-pCx 65% @ Om1 w/ ost 85%. pLAD 60% & 50% after D1 with distal 75%, ostD1 65% & 90% after small side branch.     TRANSTHORACIC ECHOCARDIOGRAM  04/21/2018   EF 50-55%. Posterior Wall HK. Severe RV HK, dilated IVC. Temp wire in place   VASECTOMY     VIDEO BRONCHOSCOPY Bilateral 08/25/2012   Procedure: VIDEO BRONCHOSCOPY WITHOUT FLUORO;  Surgeon: Brand Males, MD;  Location: [facility];  Service: Cardiopulmonary;  Laterality: Bilateral;    Family History  Problem Relation Age of Onset   Diabetes Mellitus II Other    Hypertension Other    Other Father        complications from strep   CVA Father    Cancer Sister    Other Brother        hip replacement complications     Social History:  reports that he quit smoking about 46 years ago. His smoking use included cigarettes. He has a 99.00 pack-year smoking history. He quit smokeless tobacco use about 44 years ago. He reports that he does not drink alcohol and does not use drugs.  Review of Systems  BLOOD PRESSURE:  He is not on any treatment for high blood pressure for several years  BP Readings from Last 3 Encounters:  04/18/20 (!) 118/58  12/17/19 (!) 124/58  12/14/19 130/70   Taking Lasix for edema, now taking 20 mg twice daily from nephrologist   ANEMIA:   He has a  history of anemia related to renal dysfunction and mild iron deficiency  Has been treated with iron infusions previously   At times has had low iron saturation, recent labs not available from nephrologist  Lab Results  Component Value Date    WBC 12.7 (H) 08/07/2018   HGB 9.7 (L) 08/07/2018   HCT 29.5 (L) 08/07/2018   MCV 96.1 08/07/2018   PLT 413.0 (H) 08/07/2018   Lab Results  Component Value Date   IRON 25 (L) 06/04/2019   TIBC 230 (L) 04/23/2018   FERRITIN 66.2 08/07/2018    He has had sleep apnea, using CPAP and this is being adjusted by pulmonologist.     HYPERLIPIDEMIA:     He is on atorvastatin since his MI prescribed by cardiologist, last LDL:  Lab Results  Component Value Date   CHOL 106 03/09/2020   HDL 41 03/09/2020   LDLCALC 49 03/09/2020   TRIG 79 03/09/2020   CHOLHDL 3 05/09/2018   Lab Results  Component Value Date   ALT 21 06/04/2019        Examination:   BP (!) 118/58    Pulse 79    Ht 6' (1.829 m)    Wt 186 lb 12.8 oz (84.7 kg)    SpO2 98%  BMI 25.33 kg/m   Body mass index is 25.33 kg/m.   1-2+ lower leg edema present  Assesment/PLAN:   1. Diabetes type 2 with obesity treated with basal bolus insulin  See history of present illness for detailed discussion of current diabetes management, blood sugar patterns and problems identifie  A1c is excellent at 6.7 and consistently below 7  Considering his age his blood sugar control is overall excellent with basal bolus insulin However his freestyle libre appears to be readings somewhat lower than actual reading with 16 mg difference in the office today  Also his libre sensor is likely not consistently accurate with frequent low sugars in the first week of the last 2 weeks data, unlikely that 26% of his blood sugars are below 70 overall Symptoms of hypoglycemia has been minimal and infrequent  However considering his age and need to avoid hypoglycemia especially with renal dysfunction have made the following changes  Lantus 14 units  Morning NovoLog 6 units  No additional insulin to cover high readings after dinner  If sugars are still low before lunch can have a midmorning snack  Given him a new reader for the freestyle libre  version 2 and he will start using this when he finishes his current supply  Apply the sensor to his upper or inner arm instead of legs  2.   Chronic kidney disease: His creatinine recently is about 2.4 and stable Continue follow-up with nephrologist for edema    Patient Instructions  lantus 14 units daily  Morning Novolog insulin to 6 units  Use inner arm for sensor  No extra novolog after dinner even if sugar over 200    Follow-up in 4 months   Verdella Laidlaw  04/18/2020, 11:49 AM      q

## 2020-04-18 NOTE — Patient Instructions (Addendum)
lantus 14 units daily  Morning Novolog insulin to 6 units  Use inner arm for sensor  No extra novolog after dinner even if sugar over 200

## 2020-04-21 ENCOUNTER — Other Ambulatory Visit: Payer: Self-pay

## 2020-04-21 ENCOUNTER — Telehealth: Payer: Self-pay | Admitting: Endocrinology

## 2020-04-21 MED ORDER — FREESTYLE LIBRE 14 DAY SENSOR MISC: 0 refills | Status: DC

## 2020-04-21 NOTE — Telephone Encounter (Signed)
NA

## 2020-05-10 ENCOUNTER — Other Ambulatory Visit: Payer: Self-pay | Admitting: *Deleted

## 2020-05-10 ENCOUNTER — Telehealth: Payer: Self-pay | Admitting: Endocrinology

## 2020-05-10 MED ORDER — FREESTYLE LIBRE 14 DAY SENSOR MISC
0 refills | Status: DC
Start: 2020-05-10 — End: 2020-08-22

## 2020-05-10 NOTE — Telephone Encounter (Signed)
Pt request FREESTYLE LIBRE samples. Pt says [employer] will not let him get prescription saying it is too soon. Pt will send daughter Angelene Giovanni will come pick up samples for him. Daughter ph [phone].  Pt ph [phone]

## 2020-05-10 NOTE — Telephone Encounter (Signed)
Patient called to request a new RX for [employer] 2 Sensors/Supplies be sent to [facility] on [address]

## 2020-05-10 NOTE — Telephone Encounter (Signed)
Rx sent 

## 2020-05-10 NOTE — Telephone Encounter (Signed)
Samples left

## 2020-05-18 NOTE — Telephone Encounter (Signed)
2 more sensors have been left at front desk for Mr. Celestine

## 2020-05-18 NOTE — Telephone Encounter (Signed)
Patients daughter calling to request additional samples of FREESTYLE LIBRE sensors or an RX to local pharmacy for just a couple until [facility] can fill.  They are currently working with [employer] to get them and patient is currently wearing his last one and it will run out in 5 days.   Please contact daughter Angelene Giovanni at [phone]

## 2020-05-20 DIAGNOSIS — I872 Venous insufficiency (chronic) (peripheral): Secondary | ICD-10-CM | POA: Diagnosis not present

## 2020-05-20 DIAGNOSIS — Z85828 Personal history of other malignant neoplasm of skin: Secondary | ICD-10-CM | POA: Diagnosis not present

## 2020-05-20 DIAGNOSIS — N184 Chronic kidney disease, stage 4 (severe): Secondary | ICD-10-CM | POA: Diagnosis not present

## 2020-05-20 DIAGNOSIS — C44319 Basal cell carcinoma of skin of other parts of face: Secondary | ICD-10-CM | POA: Diagnosis not present

## 2020-05-20 DIAGNOSIS — L308 Other specified dermatitis: Secondary | ICD-10-CM | POA: Diagnosis not present

## 2020-05-20 DIAGNOSIS — Z08 Encounter for follow-up examination after completed treatment for malignant neoplasm: Secondary | ICD-10-CM | POA: Diagnosis not present

## 2020-05-23 ENCOUNTER — Other Ambulatory Visit: Payer: Self-pay | Admitting: Endocrinology

## 2020-05-24 DIAGNOSIS — I129 Hypertensive chronic kidney disease with stage 1 through stage 4 chronic kidney disease, or unspecified chronic kidney disease: Secondary | ICD-10-CM | POA: Diagnosis not present

## 2020-05-24 DIAGNOSIS — N184 Chronic kidney disease, stage 4 (severe): Secondary | ICD-10-CM | POA: Diagnosis not present

## 2020-05-25 NOTE — Telephone Encounter (Signed)
No further action required

## 2020-05-27 ENCOUNTER — Telehealth: Payer: Self-pay | Admitting: Endocrinology

## 2020-05-27 NOTE — Telephone Encounter (Signed)
Office notes faxed to [facility] on 05/27/2020 at 1:58pm.

## 2020-05-27 NOTE — Telephone Encounter (Signed)
[employer] called they received the CMN, but still need office notes from last visit.  Fax# [phone] Attn: [facility]

## 2020-06-08 DIAGNOSIS — E119 Type 2 diabetes mellitus without complications: Secondary | ICD-10-CM | POA: Diagnosis not present

## 2020-07-15 ENCOUNTER — Other Ambulatory Visit: Payer: Self-pay | Admitting: *Deleted

## 2020-07-15 ENCOUNTER — Telehealth: Payer: Self-pay | Admitting: Endocrinology

## 2020-07-15 MED ORDER — BD SWAB SINGLE USE REGULAR PADS: MEDICATED_PAD | 0 refills | Status: AC

## 2020-07-15 NOTE — Telephone Encounter (Signed)
Patient called to request new RX's for the following medications: furosemide (LASIX) 20 MG tablet (Patient states he takes 3 tablets per day  Alcohol Swabs (B-D SINGLE USE SWABS REGULAR) PADS  Be sent to   [facility], [address] AT [address] Phone:  [phone]  Fax:  [phone]

## 2020-07-15 NOTE — Telephone Encounter (Signed)
Alcohol wipes were sent to his pharmacy. A message was forwarded to Dr. Ellyn Hack to refill his lasix.

## 2020-07-19 ENCOUNTER — Encounter: Payer: Self-pay | Admitting: Endocrinology

## 2020-07-19 ENCOUNTER — Other Ambulatory Visit: Payer: Self-pay

## 2020-07-19 ENCOUNTER — Ambulatory Visit (INDEPENDENT_AMBULATORY_CARE_PROVIDER_SITE_OTHER): Payer: HMO | Admitting: Endocrinology

## 2020-07-19 VITALS — BP 132/68 | HR 86 | Ht 72.0 in | Wt 178.8 lb

## 2020-07-19 DIAGNOSIS — R600 Localized edema: Secondary | ICD-10-CM | POA: Diagnosis not present

## 2020-07-19 DIAGNOSIS — Z794 Long term (current) use of insulin: Secondary | ICD-10-CM | POA: Diagnosis not present

## 2020-07-19 DIAGNOSIS — N1832 Chronic kidney disease, stage 3b: Secondary | ICD-10-CM

## 2020-07-19 DIAGNOSIS — R634 Abnormal weight loss: Secondary | ICD-10-CM | POA: Diagnosis not present

## 2020-07-19 DIAGNOSIS — E1165 Type 2 diabetes mellitus with hyperglycemia: Secondary | ICD-10-CM

## 2020-07-19 LAB — CBC
HCT: 25.1 % — ABNORMAL LOW (ref 39.0–52.0)
Hemoglobin: 8.4 g/dL — ABNORMAL LOW (ref 13.0–17.0)
MCHC: 33.4 g/dL (ref 30.0–36.0)
MCV: 95.4 fl (ref 78.0–100.0)
Platelets: 393 10*3/uL (ref 150.0–400.0)
RBC: 2.63 Mil/uL — ABNORMAL LOW (ref 4.22–5.81)
RDW: 14.8 % (ref 11.5–15.5)
WBC: 11.3 10*3/uL — ABNORMAL HIGH (ref 4.0–10.5)

## 2020-07-19 LAB — COMPREHENSIVE METABOLIC PANEL
ALT: 20 U/L (ref 0–53)
AST: 25 U/L (ref 0–37)
Albumin: 3.7 g/dL (ref 3.5–5.2)
Alkaline Phosphatase: 129 U/L — ABNORMAL HIGH (ref 39–117)
BUN: 107 mg/dL (ref 6–23)
CO2: 24 mEq/L (ref 19–32)
Calcium: 9.4 mg/dL (ref 8.4–10.5)
Chloride: 103 mEq/L (ref 96–112)
Creatinine, Ser: 3.11 mg/dL — ABNORMAL HIGH (ref 0.40–1.50)
GFR: 16.99 mL/min — ABNORMAL LOW (ref >60.00)
Glucose, Bld: 206 mg/dL — ABNORMAL HIGH (ref 70–99)
Potassium: 3.8 mEq/L (ref 3.5–5.1)
Sodium: 137 mEq/L (ref 135–145)
Total Bilirubin: 0.3 mg/dL (ref 0.2–1.2)
Total Protein: 7.1 g/dL (ref 6.0–8.3)

## 2020-07-19 LAB — POCT GLYCOSYLATED HEMOGLOBIN (HGB A1C): Hemoglobin A1C: 6.5 % — AB (ref 4.0–5.6)

## 2020-07-19 LAB — TSH: TSH: 2.82 u[IU]/mL (ref 0.35–4.50)

## 2020-07-19 LAB — LIPID PANEL
Cholesterol: 103 mg/dL (ref 0–200)
HDL: 43.2 mg/dL (ref >39.00)
LDL Cholesterol: 48 mg/dL (ref 0–99)
NonHDL: 59.77
Total CHOL/HDL Ratio: 2
Triglycerides: 60 mg/dL (ref 0.0–149.0)
VLDL: 12 mg/dL (ref 0.0–40.0)

## 2020-07-19 LAB — GLUCOSE, POCT (MANUAL RESULT ENTRY): POC Glucose: 231 mg/dl — AB (ref 70–99)

## 2020-07-19 NOTE — Patient Instructions (Signed)
lantus 15-16 units units and keep am average 130

## 2020-07-19 NOTE — Progress Notes (Signed)
Patient ID: [place] [name], male   DOB: Feb 12, 1928, 87 y.o.   MRN: [id number]     Reason for Appointment: Followup of diabetes and various issues  History of Present Illness   Type 2 DIABETES MELITUS diagnosed 1989  He has had long-standing diabetes and has been on basal bolus insulin for the last few years Since 2014 he has required larger doses of insulin especially with having to take periodic courses of steroids for his pulmonary problems Also his Actos was stopped because of tendency to edema and metformin stopped because of renal dysfunction  Insulin regimen: Novolog 6-8-5/8 units before meals, Lantus 14 units in a.m., using syringes  A1c is now 6.5 compared to 6.7   Current blood sugar patterns and problems identified:  He was switched to the freestyle libre version 2 and also asked to make sure he is getting the sensor on his upper arm instead of his leg  He does not  have a fingerstick meter at home to compare but his office glucose reading was 231 compared to 209 from sensor today right after lunch  He is fully consistently checking his blood sugars several times a day and even at night  With reducing his Lantus to 14 units his fasting readings on the new sensor are ranging between 133 and 186  His daughter says that he will sometimes adjust his mealtime dose based on what he is eating and she will help him adjust the dose at dinnertime  He is using the insulin syringes but not clear if he is measuring it accurately  If the weather is good he may do some walking afternoon  He may have had some steroids for lower leg rash sometime late last year but details not available  Recently appears to have lost some weight again  Blood sugar patterns from the CGM download are interpreted as follows   Overnight blood sugars are generally fairly stable averaging about 150  Overall blood sugar variability is not excessive  He has mild postprandial rise in blood  sugar but usually no more than 40-50 mg compared to [place]reme[place] readings  Highest blood sugars are after dinner but Premeal blood sugar is also relatively high at 161  No hypoglycemia documented  LOWEST blood sugars are right before lunchtime averaging 128  CGM use % of time  99  2-week average/GV  159/20  Time in range    78    %  % Time Above 180  22  % Time above 250   % Time Below 70 0     PRE-MEAL Fasting Lunch Dinner Bedtime Overall  Glucose range:       Averages:  153  128  161  159   POST-MEAL PC Breakfast PC Lunch PC Dinner  Glucose range:     Averages:  167  172  182    PRIOR data:  CGM use % of time  95  2-week average/SD  99  Time in range  72     %  % Time Above 180 2  % Time above 250   % Time Below 70  26     Meals: 3 meals per day.  breakfast usually about 5 AM: oatmeal / grits, with peanut butter, for lunch he will have a sandwich with apple around 11 AM  Dinner usually at 5 PM-6 PM       Weight history:  Wt Readings from Last 3  Encounters:  07/19/20 178 lb 12.8 oz (81.1 kg)  04/18/20 186 lb 12.8 oz (84.7 kg)  12/17/19 178 lb 9.6 oz (81 kg)   LABS:  Lab Results  Component Value Date   HGBA1C 6.5 (A) 07/19/2020   HGBA1C 6.7 03/29/2020   HGBA1C 6.1 (A) 12/17/2019   Lab Results  Component Value Date   MICROALBUR 5.4 (H) 08/07/2018   LDLCALC 49 03/09/2020   CREATININE 2.25 (H) 06/04/2019   Problem 2:  Chronic kidney disease:  He has had chronic renal insufficiency, creatinine was 1.7 in 2014 with slow progression subsequently.  This has been related to nephropathy and glomerulosclerosis Has been evaluated and followed by nephrologist Renal ultrasound did not show any abnormality  Creat 2.4 in 9/21   Lab Results  Component Value Date   CREATININE 2.25 (H) 06/04/2019   CREATININE 2.50 (H) 08/07/2018   CREATININE 2.44 (H) 06/13/2018   CREATININE 2.57 (H) 05/09/2018   He has been prescribed Lasix 20 mg daily, last prescription done  by his cardiologist    Allergies as of 07/19/2020      Reactions   Doxycycline Rash   Levaquin [levofloxacin In D5w]    Caused chest pain and heartburn   Nitrous Oxide Other (See Comments)   Reaction:  Unknown    Penicillins Other (See Comments)   Reaction:  Unknown Has patient had a PCN reaction causing immediate rash, facial/tongue/throat swelling, SOB or lightheadedness with hypotension: Unsure Has patient had a PCN reaction causing severe rash involving mucus membranes or skin necrosis: Unsure Has patient had a PCN reaction that required hospitalization Unsure  Has patient had a PCN reaction occurring within the last 10 years: No If all of the above answers are "NO", then may proceed with Cephalosporin use.   Tape Other (See Comments)   Reaction:  Tears pts skin    Diltiazem Rash      Medication List       Accurate as of July 19, 2020  1:40 PM. If you have any questions, ask your nurse or doctor.        Accu-Chek Softclix Lancets lancets TEST BLOOD SUGAR FOUR TIMES DAILY   albuterol 108 (90 Base) MCG/ACT inhaler Commonly known as: VENTOLIN HFA Inhale 1-2 puffs into the lungs every 6 (six) hours as needed for wheezing or shortness of breath.   atorvastatin 80 MG tablet Commonly known as: LIPITOR TAKE 1/2 TABLET(40 MG) BY MOUTH DAILY   B-D SINGLE USE SWABS REGULAR Pads USE 7 SWABS DAILY AS DIRECTED   benzonatate 100 MG capsule Commonly known as: TESSALON Take by mouth 3 (three) times daily as needed for cough.   Brilinta 60 MG Tabs tablet Generic drug: ticagrelor TAKE 1 TABLET(60 MG) BY MOUTH TWICE DAILY   budesonide-formoterol 160-4.5 MCG/ACT inhaler Commonly known as: Symbicort Inhale 2 puffs into the lungs 2 (two) times daily.   cholecalciferol 1000 units tablet Commonly known as: VITAMIN D Take 2,000 Units by mouth daily.   clindamycin 150 MG capsule Commonly known as: CLEOCIN   Droplet Insulin Syringe 31G X 5/16" 0.3 ML Misc Generic drug:  Insulin Syringe-Needle U-100 USE THREE TIMES DAILY   finasteride 5 MG tablet Commonly known as: PROSCAR TAKE 1 TABLET EVERY DAY   FreeStyle Libre 14 Day Reader Kerrin Mo 1 each by Does not apply route every 14 (fourteen) days. Use reader to monitor blood sugar continuously with freestyle libre sensor.   FreeStyle Libre 14 Day Sensor Misc USE EVERY 14 DAYS TO MONITOR BLOOD  SUGAR CONTINUOUSLY   furosemide 20 MG tablet Commonly known as: LASIX Take 1 tablet (20 mg total) by mouth 2 (two) times daily. Take seocnd dose about 12 noon each daily   glucose blood test strip Use as instructed to test blood sugars 4 times daily   insulin aspart 100 UNIT/ML injection Commonly known as: NovoLOG INJECT 7 UNITS SUBCUTANEOUSLY IN THE MORNING, 6 UNITS AT LUNCH, AND 9 UNITS AT SUPPER (DISCARD VIAL 28 DAYS AFTER OPENING)   isosorbide mononitrate 30 MG 24 hr tablet Commonly known as: IMDUR TAKE 1 TABLET BY MOUTH DAILY   Lantus 100 UNIT/ML injection Generic drug: insulin glargine INJECT  26  UNITS SUBCUTANEOUSLY EVERY MORNING (DISCARD VIAL 28 DAYS AFTER OPENING) What changed: See the new instructions.   melatonin 5 MG Tabs Take 2 tablets by mouth at bedtime.   montelukast 10 MG tablet Commonly known as: SINGULAIR   multivitamin with minerals Tabs tablet Take 1 tablet by mouth daily.   nitroGLYCERIN 0.4 MG SL tablet Commonly known as: NITROSTAT Place 1 tablet (0.4 mg total) under the tongue every 5 (five) minutes as needed for chest pain.   omeprazole 20 MG capsule Commonly known as: PRILOSEC Take 1 capsule (20 mg total) by mouth daily.   oxybutynin 5 MG tablet Commonly known as: DITROPAN       Allergies:  Allergies  Allergen Reactions  . Doxycycline Rash  . Levaquin [Levofloxacin In D5w]     Caused chest pain and heartburn  . Nitrous Oxide Other (See Comments)    Reaction:  Unknown   . Penicillins Other (See Comments)    Reaction:  Unknown Has patient had a PCN reaction  causing immediate rash, facial/tongue/throat swelling, SOB or lightheadedness with hypotension: Unsure Has patient had a PCN reaction causing severe rash involving mucus membranes or skin necrosis: Unsure Has patient had a PCN reaction that required hospitalization Unsure  Has patient had a PCN reaction occurring within the last 10 years: No If all of the above answers are "NO", then may proceed with Cephalosporin use.  . Tape Other (See Comments)    Reaction:  Tears pts skin   . Diltiazem Rash    Past Medical History:  Diagnosis Date  . Anemia   . BPH (benign prostatic hyperplasia)   . CAD (coronary artery disease)    hx of stemi  04-2018   . Chronic renal failure   . Chronic renal insufficiency   . COPD (chronic obstructive pulmonary disease) ([place])   . DDD (degenerative disc disease), lumbar   . Diabetes mellitus   . Hyperlipidemia   . Hyperparathyroidism ([place])   . Hypertension   . IDA (iron deficiency anemia)   . Neuromuscular disorder ([place])   . OSA on CPAP   . Osteomyelitis of forearm ([place])   . Osteoporosis     Past Surgical History:  Procedure Laterality Date  . CORONARY STENT INTERVENTION N/A 04/20/2018   Procedure: CORONARY STENT INTERVENTION;  Surgeon: Leonie Man, MD;  Location: [facility] INVASIVE CV LAB;;;    . [facility] [place] ACUTE MI REVASCULARIZATION N/A 04/20/2018   Procedure: Coronary/Graft Acute MI Revascularization;  Surgeon: Leonie Man, MD;  Location: [place] CV LAB;  Service: Cardiovascular;  Laterality: N/A; --> after initial PTCA restoring flow down the RCA, there was diffuse proximal to mid disease treated with a DES SYNERGY 3 X 38 ( 3.59mm)  . EYE SURGERY    . HOLTER MONITOR  05/2018   Sinus rhythm noted with sinus bradycardia.  Also sinus rhythm with 2-1 AV block noted.  Maximum heart rate was sinus tachycardia 123 bpm.  Rare PVCs accelerated idioventricular rhythm noted.  Intermittent Wenckebach block noted.  . I & D EXTREMITY Right 08/13/2016    Procedure: IRRIGATION AND DEBRIDEMENT RIGHT ELBOW AND HAND;  Surgeon: Milly Jakob, MD;  Location: [facility] OR[facility];  Service: Orthopedics;  Laterality: Right;  . LEFT HEART CATH AND CORONARY ANGIOGRAPHY N/A 04/20/2018   Procedure: LEFT HEART CATH AND CORONARY ANGIOGRAPHY;  Surgeon: Leonie Man, MD;  Location: [place] CV LAB; mRCA 100%&80% (after 55% tapering in pRCA), mLM-ostLAD 40% w/ 85% ostCx, ost-pCx 65% @ Om1 w/ ost 85%. pLAD 60% & 50% after D1 with distal 75%, ostD1 65% & 90% after small side branch.    . TRANSTHORACIC ECHOCARDIOGRAM  04/21/2018   EF 50-55%. Posterior Wall HK. Severe RV HK, dilated IVC. Temp wire in place  . VASECTOMY    . VIDEO BRONCHOSCOPY Bilateral 08/25/2012   Procedure: VIDEO BRONCHOSCOPY WITHOUT FLUORO;  Surgeon: Brand Males, MD;  Location: [facility];  Service: Cardiopulmonary;  Laterality: Bilateral;    Family History  Problem Relation Age of Onset  . Diabetes Mellitus II Other   . Hypertension Other   . Other Father        complications from strep  . CVA Father   . Cancer Sister   . Other Brother        hip replacement complications     Social History:  reports that he quit smoking about 47 years ago. His smoking use included cigarettes. He has a 99.00 pack-year smoking history. He quit smokeless tobacco use about 44 years ago. He reports that he does not drink alcohol and does not use drugs.  Review of Systems  BLOOD PRESSURE:  Not on any specific treatment  BP Readings from Last 3 Encounters:  07/19/20 132/68  04/18/20 (!) 118/58  12/17/19 (!) 124/58   Taking Lasix for edema, taking 20 mg twice daily, dose adjusted by nephrologist and cardiologist   ANEMIA:   He has a  history of anemia related to renal dysfunction and mild iron deficiency  Has been treated with iron infusions previously   At times has had low iron saturation, recent labs not available from nephrologist  Lab Results  Component Value Date   WBC 12.7 (H) 08/07/2018    HGB 9.7 (L) 08/07/2018   HCT 29.5 (L) 08/07/2018   MCV 96.1 08/07/2018   PLT 413.0 (H) 08/07/2018   Lab Results  Component Value Date   IRON 25 (L) 06/04/2019   TIBC 230 (L) 04/23/2018   FERRITIN 66.2 08/07/2018    He has had sleep apnea, using CPAP and this is being adjusted by pulmonologist.     HYPERLIPIDEMIA:     He is on atorvastatin since his MI prescribed by cardiologist, last LDL:  Lab Results  Component Value Date   CHOL 106 03/09/2020   HDL 41 03/09/2020   LDLCALC 49 03/09/2020   TRIG 79 03/09/2020   CHOLHDL 3 05/09/2018   Lab Results  Component Value Date   ALT 21 06/04/2019        Examination:   BP 132/68   Pulse 86   Ht 6' (1.829 m)   Wt 178 lb 12.8 oz (81.1 kg)   SpO2 99%   BMI 24.25 kg/m   Body mass index is 24.25 kg/m.   Mild lower leg edema along with slightly reddish macular lower leg rash which is patchy  Assesment/PLAN:   1. Diabetes type 2 with obesity treated with basal bolus insulin  See history of present illness for detailed discussion of current diabetes management, blood sugar patterns and problems identifie  A1c is excellent at 6.5  Again his freestyle libre appears to be reading about 10-15% lower than the actual fingersticks reading today However this is somewhat more accurate than his previous sensor which was being used on his leg His blood sugars are generally under good control with 78% within target range and no hypoglycemia Still requiring low-dose of insulin because of his renal insufficiency  Blood sugars are slightly higher after lunch and before dinner probably from sometimes getting more carbohydrate or snacks However again with his age and comorbid conditions his level of control is still excellent   Lantus 15 units and increase to 16 if morning sugars are consistently over 130 at home  NovoLog to be adjusted based on meal size and carbohydrate intake  Showed him an insulin pen and discussed that this would  be more convenient, more accurate instead of syringes and he will let us know when his supply of insulin vials is finished  No change in basic insulin dose for now  Make sure he is applying the sensor on the upper arms  Discussed blood sugar targets after meals  2.   Chronic kidney disease: Followed by nephrologist and CKD has been stable, last about 2.5 He will continue to get Lasix from his nephrologist  No recent CBC available and will check today  Lipids to be rechecked as well as TSH   Patient Instructions  lantus 15-16 units units and keep am average 130     Follow-up in 3 months   Pualani Borah  07/19/2020, 1:40 PM      q

## 2020-07-20 ENCOUNTER — Telehealth: Payer: Self-pay | Admitting: Cardiology

## 2020-07-20 NOTE — Telephone Encounter (Signed)
Advised needs to follow up with PCP, verbalized understanding

## 2020-07-20 NOTE — Telephone Encounter (Signed)
Patients daughter is calling stating that his diabetic doctor told him today that he was severely anemic. She states that she would like him to be scheduled here for an infusion? Please advise.

## 2020-07-28 ENCOUNTER — Other Ambulatory Visit: Payer: Self-pay | Admitting: Nephrology

## 2020-07-28 DIAGNOSIS — N184 Chronic kidney disease, stage 4 (severe): Secondary | ICD-10-CM

## 2020-07-28 DIAGNOSIS — R634 Abnormal weight loss: Secondary | ICD-10-CM

## 2020-07-28 DIAGNOSIS — I129 Hypertensive chronic kidney disease with stage 1 through stage 4 chronic kidney disease, or unspecified chronic kidney disease: Secondary | ICD-10-CM

## 2020-07-28 DIAGNOSIS — R627 Adult failure to thrive: Secondary | ICD-10-CM

## 2020-07-28 DIAGNOSIS — D631 Anemia in chronic kidney disease: Secondary | ICD-10-CM

## 2020-08-15 ENCOUNTER — Ambulatory Visit
Admission: RE | Admit: 2020-08-15 | Discharge: 2020-08-15 | Disposition: A | Discharge status: Home / Self Care | Payer: HMO | Source: Ambulatory Visit | Attending: Nephrology | Admitting: Nephrology

## 2020-08-15 DIAGNOSIS — R634 Abnormal weight loss: Secondary | ICD-10-CM

## 2020-08-15 DIAGNOSIS — D631 Anemia in chronic kidney disease: Secondary | ICD-10-CM

## 2020-08-15 DIAGNOSIS — N189 Chronic kidney disease, unspecified: Secondary | ICD-10-CM

## 2020-08-15 DIAGNOSIS — N184 Chronic kidney disease, stage 4 (severe): Secondary | ICD-10-CM

## 2020-08-15 DIAGNOSIS — R627 Adult failure to thrive: Secondary | ICD-10-CM

## 2020-08-15 DIAGNOSIS — I129 Hypertensive chronic kidney disease with stage 1 through stage 4 chronic kidney disease, or unspecified chronic kidney disease: Secondary | ICD-10-CM

## 2020-08-17 ENCOUNTER — Telehealth: Payer: Self-pay | Admitting: Endocrinology

## 2020-08-17 NOTE — Telephone Encounter (Signed)
Pt called regarding getting a new Freestyle Libre meter. See below.

## 2020-08-17 NOTE — Telephone Encounter (Signed)
Arnethia with [facility] on [address] called to request a new RX for Continuous Blood Gluc Receiver (FREESTYLE LIBRE 14 DAY READER) DEVI be sent to them at:  [place], [place] [address] Phone:  [phone]  Fax:  [phone]

## 2020-08-19 ENCOUNTER — Other Ambulatory Visit: Payer: Self-pay | Admitting: *Deleted

## 2020-08-19 DIAGNOSIS — E1165 Type 2 diabetes mellitus with hyperglycemia: Secondary | ICD-10-CM

## 2020-08-19 DIAGNOSIS — Z794 Long term (current) use of insulin: Secondary | ICD-10-CM

## 2020-08-19 MED ORDER — FREESTYLE LIBRE 14 DAY READER DEVI: 1.0000 | 0 refills | Status: AC

## 2020-08-19 NOTE — Telephone Encounter (Signed)
Sent Rx freestyle libre reader to [facility]

## 2020-08-22 ENCOUNTER — Other Ambulatory Visit: Payer: Self-pay

## 2020-08-22 ENCOUNTER — Telehealth: Payer: Self-pay | Admitting: Hematology and Oncology

## 2020-08-22 ENCOUNTER — Inpatient Hospital Stay: Payer: HMO | Attending: Hematology and Oncology | Admitting: Hematology and Oncology

## 2020-08-22 ENCOUNTER — Encounter: Payer: Self-pay | Admitting: Hematology and Oncology

## 2020-08-22 ENCOUNTER — Inpatient Hospital Stay: Payer: HMO

## 2020-08-22 VITALS — HR 90 | Temp 96.9°F | Resp 20 | Ht 72.0 in | Wt 173.7 lb

## 2020-08-22 DIAGNOSIS — N184 Chronic kidney disease, stage 4 (severe): Secondary | ICD-10-CM | POA: Diagnosis not present

## 2020-08-22 DIAGNOSIS — Z87891 Personal history of nicotine dependence: Secondary | ICD-10-CM | POA: Diagnosis not present

## 2020-08-22 DIAGNOSIS — I129 Hypertensive chronic kidney disease with stage 1 through stage 4 chronic kidney disease, or unspecified chronic kidney disease: Secondary | ICD-10-CM | POA: Insufficient documentation

## 2020-08-22 DIAGNOSIS — M5136 Other intervertebral disc degeneration, lumbar region: Secondary | ICD-10-CM | POA: Insufficient documentation

## 2020-08-22 DIAGNOSIS — R21 Rash and other nonspecific skin eruption: Secondary | ICD-10-CM | POA: Insufficient documentation

## 2020-08-22 DIAGNOSIS — I2510 Atherosclerotic heart disease of native coronary artery without angina pectoris: Secondary | ICD-10-CM | POA: Diagnosis not present

## 2020-08-22 DIAGNOSIS — E119 Type 2 diabetes mellitus without complications: Secondary | ICD-10-CM | POA: Diagnosis not present

## 2020-08-22 DIAGNOSIS — Z79899 Other long term (current) drug therapy: Secondary | ICD-10-CM | POA: Insufficient documentation

## 2020-08-22 DIAGNOSIS — D72829 Elevated white blood cell count, unspecified: Secondary | ICD-10-CM | POA: Diagnosis not present

## 2020-08-22 DIAGNOSIS — R634 Abnormal weight loss: Secondary | ICD-10-CM | POA: Diagnosis not present

## 2020-08-22 DIAGNOSIS — N189 Chronic kidney disease, unspecified: Secondary | ICD-10-CM | POA: Insufficient documentation

## 2020-08-22 DIAGNOSIS — N400 Enlarged prostate without lower urinary tract symptoms: Secondary | ICD-10-CM | POA: Insufficient documentation

## 2020-08-22 DIAGNOSIS — Z794 Long term (current) use of insulin: Secondary | ICD-10-CM | POA: Diagnosis not present

## 2020-08-22 DIAGNOSIS — D539 Nutritional anemia, unspecified: Secondary | ICD-10-CM | POA: Diagnosis not present

## 2020-08-22 DIAGNOSIS — D631 Anemia in chronic kidney disease: Secondary | ICD-10-CM

## 2020-08-22 LAB — CBC WITH DIFFERENTIAL/PLATELET
Abs Immature Granulocytes: 0.06 10*3/uL (ref 0.00–0.07)
Basophils Absolute: 0.1 10*3/uL (ref 0.0–0.1)
Basophils Relative: 1 %
Eosinophils Absolute: 0.4 10*3/uL (ref 0.0–0.5)
Eosinophils Relative: 3 %
HCT: 25.8 % — ABNORMAL LOW (ref 39.0–52.0)
Hemoglobin: 8.1 g/dL — ABNORMAL LOW (ref 13.0–17.0)
Immature Granulocytes: 1 %
Lymphocytes Relative: 7 %
Lymphs Abs: 0.9 10*3/uL (ref 0.7–4.0)
MCH: 31.5 pg (ref 26.0–34.0)
MCHC: 31.4 g/dL (ref 30.0–36.0)
MCV: 100.4 fL — ABNORMAL HIGH (ref 80.0–100.0)
Monocytes Absolute: 1.3 10*3/uL — ABNORMAL HIGH (ref 0.1–1.0)
Monocytes Relative: 10 %
Neutro Abs: 10 10*3/uL — ABNORMAL HIGH (ref 1.7–7.7)
Neutrophils Relative %: 78 %
Platelets: 353 10*3/uL (ref 150–400)
RBC: 2.57 MIL/uL — ABNORMAL LOW (ref 4.22–5.81)
RDW: 14.6 % (ref 11.5–15.5)
WBC: 12.8 10*3/uL — ABNORMAL HIGH (ref 4.0–10.5)
nRBC: 0 % (ref 0.0–0.2)

## 2020-08-22 LAB — COMPREHENSIVE METABOLIC PANEL
ALT: 22 U/L (ref 0–44)
AST: 25 U/L (ref 15–41)
Albumin: 3.6 g/dL (ref 3.5–5.0)
Alkaline Phosphatase: 121 U/L (ref 38–126)
Anion gap: 11 (ref 5–15)
BUN: 85 mg/dL — ABNORMAL HIGH (ref 8–23)
CO2: 24 mmol/L (ref 22–32)
Calcium: 8.8 mg/dL — ABNORMAL LOW (ref 8.9–10.3)
Chloride: 105 mmol/L (ref 98–111)
Creatinine, Ser: 3.1 mg/dL (ref 0.61–1.24)
GFR, Estimated: 18 mL/min — ABNORMAL LOW (ref >60)
Glucose, Bld: 179 mg/dL — ABNORMAL HIGH (ref 70–99)
Potassium: 3.7 mmol/L (ref 3.5–5.1)
Sodium: 140 mmol/L (ref 135–145)
Total Bilirubin: 0.2 mg/dL — ABNORMAL LOW (ref 0.3–1.2)
Total Protein: 7.4 g/dL (ref 6.5–8.1)

## 2020-08-22 LAB — FERRITIN: Ferritin: 23 ng/mL — ABNORMAL LOW (ref 24–336)

## 2020-08-22 LAB — IRON AND TIBC
Iron: 27 ug/dL — ABNORMAL LOW (ref 42–163)
Saturation Ratios: 7 % — ABNORMAL LOW (ref 20–55)
TIBC: 365 ug/dL (ref 202–409)
UIBC: 338 ug/dL (ref 117–376)

## 2020-08-22 LAB — VITAMIN B12: Vitamin B-12: 969 pg/mL — ABNORMAL HIGH (ref 180–914)

## 2020-08-22 NOTE — Telephone Encounter (Signed)
Scheduled per los. Declined printout  

## 2020-08-22 NOTE — Progress Notes (Signed)
[facility] NOTE  Patient Care Team: Lujean Amel, MD as PCP - General (Family Medicine) Leonie Man, MD as PCP - Cardiology (Cardiology) Elayne Snare, MD as Attending Physician (Endocrinology)  CHIEF COMPLAINTS/PURPOSE OF CONSULTATION:  Anemia  ASSESSMENT & PLAN:  No problem-specific Assessment & Plan notes found for this encounter.  Orders Placed This Encounter  Procedures  . CBC with Differential/Platelet    Standing Status:   Standing    Number of Occurrences:   22    Standing Expiration Date:   08/22/2021  . Iron and TIBC    Standing Status:   Future    Number of Occurrences:   1    Standing Expiration Date:   08/22/2021  . Ferritin    Standing Status:   Future    Number of Occurrences:   1    Standing Expiration Date:   08/22/2021  . Vitamin B12    Standing Status:   Future    Number of Occurrences:   1    Standing Expiration Date:   08/22/2021  . Folate RBC    Standing Status:   Future    Number of Occurrences:   1    Standing Expiration Date:   08/22/2021  . Comprehensive metabolic panel    Standing Status:   Standing    Number of Occurrences:   33    Standing Expiration Date:   08/22/2021  . SPEP with reflex to IFE    Standing Status:   Future    Number of Occurrences:   1    Standing Expiration Date:   08/22/2021  . Kappa/lambda light chains    Standing Status:   Future    Number of Occurrences:   1    Standing Expiration Date:   08/22/2021   1. Normocytic to macrocytic anemia, chronic His baseline Hb has remained stable for the past 2 years He denies any blood loss, hematochezia or melena. Iron panel showed mild iron deficiency I recommended oral iron supplementation ferrous sulfate 325 mg PO BID I have also discussed about other possible etiologies such as CKD in this instance and the utility of epo. I would recommend that he FU with Dr Joelyn Oms for consideration of epo. We have briefly discussed about myelodysplasia as a possible  etiology but given his age, they do not want to do any aggressive procedures such as BMB. I think this is reasonable. Their goal is to do minimal procedures to keep him comfortable He will RTC in 4 weeks for FU on this.  2. Leukocytosis, mild and stable. Its predominantly neutrophilia, hence I would recommend monitoring  3. Again, refused colonoscopy  4.Weight loss, ? Occult malignancy. CT chest abdomen and pelvis with no obvious etiologies. Again, as mentioned he doesn't want to do colonoscopy. Since there is no obvious metastatic disease on the imaging, I encouraged three meals and nutritional supplements and offered nutritionist referral. They will let me know.  5. Skin rash, on clobetasone and follows up with dermatology Clobetasone has been helping Again discussed about possible biopsy, patient not interested.  HISTORY OF PRESENTING ILLNESS:  NASSIM COSMA 87 y.o. male is here because of ongoing anemia.  Mr Ratledge is a 87 yr old male patient with PMH significant for BPH, CKD, COPD, DM, HTN referred to hematology for evaluation of anemia.  Mr. Lakey arrived to the appointment today with his daughter.  He is mildly but he is independent with all his activities of daily  living, chores, walks frequently and walks about 2 miles couple times a week, plays card games every day and stays physically and mentally active.  However daughter has noticed that he has been losing more and more weight, feels more tired more and feels more sleepy.  He eats very well and he eats healthy. He complains about cough when he is lying down and the need to sleep propped up position.  Besides his cough and fatigue is any other symptoms.  He denies any changes in breathing, bowel habits or urinary habits.  No hematochezia or melena.  He had a CT in February because he was losing weight rapidly.he also has a skin rash which was unexplained.  The CT did not show any evidence of malignancy.  He has not had  colonoscopy 8 years because he was not thought to be deemed safe for the procedure.  Rest of the pertinent 10 point ROS as mentioned below and otherwise unremarkable.  REVIEW OF SYSTEMS:   Constitutional: Denies fevers, chills or abnormal night sweats. Has lost about 20 lbs in 2 yrs Eyes: Denies blurriness of vision, double vision or watery eyes Ears, nose, mouth, throat, and face: Denies mucositis or sore throat Respiratory: cough when he lays down to sleep. Cardiovascular: Denies palpitation, chest discomfort or lower extremity swelling Gastrointestinal:  Denies nausea, heartburn or change in bowel habits Skin: He does have skin rash on his legs and back for which he follows up with dermatology. Lymphatics: Denies new lymphadenopathy or easy bruising Neurological: Denies numbness, tingling or new weaknesses Behavioral/Psych: Mood is stable, no new changes  All other systems were reviewed with the patient and are negative.  MEDICAL HISTORY:  Past Medical History:  Diagnosis Date  . Anemia   . BPH (benign prostatic hyperplasia)   . CAD (coronary artery disease)    hx of stemi  04-2018   . Chronic renal failure   . Chronic renal insufficiency   . COPD (chronic obstructive pulmonary disease) ([place])   . DDD (degenerative disc disease), lumbar   . Diabetes mellitus   . Hyperlipidemia   . Hyperparathyroidism ([place])   . Hypertension   . IDA (iron deficiency anemia)   . Neuromuscular disorder ([place])   . OSA on CPAP   . Osteomyelitis of forearm ([place])   . Osteoporosis     SURGICAL HISTORY: Past Surgical History:  Procedure Laterality Date  . CORONARY STENT INTERVENTION N/A 04/20/2018   Procedure: CORONARY STENT INTERVENTION;  Surgeon: Leonie Man, MD;  Location: [facility] INVASIVE CV LAB;;;    . [place] [place] ACUTE MI REVASCULARIZATION N/A 04/20/2018   Procedure: Coronary/Graft Acute MI Revascularization;  Surgeon: Leonie Man, MD;  Location: [facility] CV LAB;  Service:  Cardiovascular;  Laterality: N/A; --> after initial PTCA restoring flow down the RCA, there was diffuse proximal to mid disease treated with a DES SYNERGY 3 X 38 ( 3.69mm)  . EYE SURGERY    . HOLTER MONITOR  05/2018   Sinus rhythm noted with sinus bradycardia.  Also sinus rhythm with 2-1 AV block noted.  Maximum heart rate was sinus tachycardia 123 bpm.  Rare PVCs accelerated idioventricular rhythm noted.  Intermittent Wenckebach block noted.  . I & D EXTREMITY Right 08/13/2016   Procedure: IRRIGATION AND DEBRIDEMENT RIGHT ELBOW AND HAND;  Surgeon: Milly Jakob, MD;  Location: [facility] ORS;  Service: Orthopedics;  Laterality: Right;  . LEFT HEART CATH AND CORONARY ANGIOGRAPHY N/A 04/20/2018   Procedure: LEFT HEART CATH AND CORONARY ANGIOGRAPHY;  Surgeon: Leonie Man, MD;  Location: [facility] INVASIVE CV LAB; mRCA 100%&80% (after 55% tapering in pRCA), mLM-ostLAD 40% w/ 85% ostCx, ost-pCx 65% @ Om1 w/ ost 85%. pLAD 60% & 50% after D1 with distal 75%, ostD1 65% & 90% after small side branch.    . TRANSTHORACIC ECHOCARDIOGRAM  04/21/2018   EF 50-55%. Posterior Wall HK. Severe RV HK, dilated IVC. Temp wire in place  . VASECTOMY    . VIDEO BRONCHOSCOPY Bilateral 08/25/2012   Procedure: VIDEO BRONCHOSCOPY WITHOUT FLUORO;  Surgeon: Brand Males, MD;  Location: [facility];  Service: Cardiopulmonary;  Laterality: Bilateral;    SOCIAL HISTORY: Social History   Socioeconomic History  . Marital status: Widowed    Spouse name: Not on file  . Number of children: 4  . Years of education: 16  . Highest education level: Bachelor's degree (e.g., BA, AB, BS)  Occupational History  . Occupation: retired  Tobacco Use  . Smoking status: Former Smoker    Packs/day: 3.00    Years: 33.00    Pack years: 99.00    Types: Cigarettes    Quit date: 06/19/1973    Years since quitting: 47.2  . Smokeless tobacco: Former Systems developer    Quit date: 09/26/1975  Vaping Use  . Vaping Use: Never used  Substance and Sexual Activity   . Alcohol use: No  . Drug use: No  . Sexual activity: Not Currently  Other Topics Concern  . Not on file  Social History Narrative   He is accompanied by his daughter who seems to be quite involved with his care.      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 He also went to the [facility] several times a week until he began to have symptoms.    Social Determinants of Health   Financial Resource Strain: Not on file  Food Insecurity: Not on file  Transportation Needs: Not on file  Physical Activity: Not on file  Stress: Not on file  Social Connections: Not on file  Intimate Partner Violence: Not on file    FAMILY HISTORY: Family History  Problem Relation Age of Onset  . Diabetes Mellitus II Other   . Hypertension Other   . Other Father        complications from strep  . CVA Father   . Cancer Sister   . Other Brother        hip replacement complications     ALLERGIES:  is allergic to doxycycline, levaquin [levofloxacin in d5w], aliskiren, lisinopril, losartan potassium, nitrous oxide, penicillins, tape, and diltiazem.  MEDICATIONS:  Current Outpatient Medications  Medication Sig Dispense Refill  . ACCU-CHEK SOFTCLIX LANCETS lancets TEST BLOOD SUGAR FOUR TIMES DAILY 400 each 1  . albuterol (PROVENTIL HFA;VENTOLIN HFA) 108 (90 Base) MCG/ACT inhaler Inhale 1-2 puffs into the lungs every 6 (six) hours as needed for wheezing or shortness of breath.    . Alcohol Swabs (B-D SINGLE USE SWABS REGULAR) PADS USE 7 SWABS DAILY AS DIRECTED 600 each 0  . atorvastatin (LIPITOR) 80 MG tablet TAKE 1/2 TABLET(40 MG) BY MOUTH DAILY 45 tablet 2  . BRILINTA 60 MG TABS tablet TAKE 1 TABLET(60 MG) BY MOUTH TWICE DAILY 60 tablet 5  . budesonide-formoterol (SYMBICORT) 160-4.5 MCG/ACT inhaler Inhale 2 puffs into the lungs 2 (two) times daily. 10.2 g 5  . cholecalciferol (VITAMIN D) 1000 units tablet Take 2,000 Units by mouth daily.    . Continuous Blood Gluc  Receiver (FREESTYLE LIBRE 14 DAY READER) DEVI 1 each by Does not apply route every 14 (fourteen) days. Use reader to monitor blood sugar continuously with freestyle libre sensor. 1 each 0  . DROPLET INSULIN SYRINGE 31G X 5/16" 0.3 ML MISC USE THREE TIMES DAILY 300 each 2  . Ferrous Sulfate (IRON PO) Take by mouth.    . finasteride (PROSCAR) 5 MG tablet TAKE 1 TABLET EVERY DAY 90 tablet 1  . furosemide (LASIX) 20 MG tablet Take 1 tablet (20 mg total) by mouth 2 (two) times daily. Take seocnd dose about 12 noon each daily 180 tablet 3  . glucose blood test strip Use as instructed to test blood sugars 4 times daily 150 each 12  . insulin aspart (NOVOLOG) 100 UNIT/ML injection INJECT 7 UNITS SUBCUTANEOUSLY IN THE MORNING, 6 UNITS AT LUNCH, AND 9 UNITS AT SUPPER (DISCARD VIAL 28 DAYS AFTER OPENING) 30 mL 1  . isosorbide mononitrate (IMDUR) 30 MG 24 hr tablet TAKE 1 TABLET BY MOUTH DAILY 30 tablet 8  . LANTUS 100 UNIT/ML injection INJECT  26  UNITS SUBCUTANEOUSLY EVERY MORNING (DISCARD VIAL 28 DAYS AFTER OPENING) (Patient taking differently: Inject 16 Units into the skin daily.) 30 mL 2  . Melatonin 5 MG TABS Take 2 tablets by mouth at bedtime.     . Multiple Vitamin (MULTIVITAMIN WITH MINERALS) TABS tablet Take 1 tablet by mouth daily.    Marland Kitchen omeprazole (PRILOSEC) 20 MG capsule Take 1 capsule (20 mg total) by mouth daily. 30 capsule 3  . oxybutynin (DITROPAN) 5 MG tablet     . benzonatate (TESSALON) 100 MG capsule Take by mouth 3 (three) times daily as needed for cough. (Patient not taking: Reported on 08/22/2020)    . montelukast (SINGULAIR) 10 MG tablet     . nitroGLYCERIN (NITROSTAT) 0.4 MG SL tablet Place 1 tablet (0.4 mg total) under the tongue every 5 (five) minutes as needed for chest pain. 25 tablet 6   No current facility-administered medications for this visit.     PHYSICAL EXAMINATION:  ECOG PERFORMANCE STATUS: 0 - Asymptomatic  Vitals:   08/22/20 1057  Pulse: 90  Resp: 20  Temp: (!)  96.9 F (36.1 C)  SpO2: 99%   Filed Weights   08/22/20 1057  Weight: 173 lb 11.2 oz (78.8 kg)    GENERAL:alert, no distress and comfortable, appears frail, no distress. SKIN: maculopapular rash noted on the back, elliptical in shape, with some discoloration from scratching. On the leg no major rash noted, but they appear erythematous. EYES: normal, conjunctiva are pink and non-injected, sclera clear OROPHARYNX:no exudate, no erythema and lips, buccal mucosa, and tongue normal  NECK: supple, thyroid normal size, non-tender, without nodularity, empty supraclavicular fossa. LYMPH:  no palpable lymphadenopathy in the cervical, axillary, LUNGS: clear to auscultation and percussion with normal breathing effort HEART: regular rate & rhythm and no murmurs and no lower extremity edema ABDOMEN: abdomen soft, non-tender , no overt hepatosplenomegaly. Musculoskeletal:no cyanosis of digits and no clubbing  PSYCH: alert & oriented x 3 with fluent speech NEURO: no focal motor/sensory deficits  LABORATORY DATA:  I have reviewed the data as listed Lab Results  Component Value Date   WBC 12.8 (H) 08/22/2020   HGB 8.1 (L) 08/22/2020   HCT 25.8 (L) 08/22/2020   MCV 100.4 (H) 08/22/2020   PLT 353 08/22/2020     Chemistry      Component Value Date/Time   NA 140 08/22/2020 1200   K 3.7 08/22/2020  1200   CL 105 08/22/2020 1200   CO2 24 08/22/2020 1200   BUN 85 (H) 08/22/2020 1200   CREATININE 3.10 (HH) 08/22/2020 1200   GLU 146 03/09/2020 0000      Component Value Date/Time   CALCIUM 8.8 (L) 08/22/2020 1200   ALKPHOS 121 08/22/2020 1200   AST 25 08/22/2020 1200   ALT 22 08/22/2020 1200   BILITOT 0.2 (L) 08/22/2020 1200       RADIOGRAPHIC STUDIES: I have personally reviewed the radiological images as listed and agreed with the findings in the report. CT CHEST ABDOMEN PELVIS WO CONTRAST  Result Date: 08/16/2020 CLINICAL DATA:  Chronic kidney disease. Former smoker. Weight loss. EXAM:  CT CHEST, ABDOMEN AND PELVIS WITHOUT CONTRAST TECHNIQUE: Multidetector CT imaging of the chest, abdomen and pelvis was performed following the standard protocol without IV contrast. COMPARISON:  12/08/2015 FINDINGS: CT CHEST FINDINGS Cardiovascular: Cardiomegaly. Diffuse coronary artery and scattered aortic calcifications. No aneurysm. Mediastinum/Nodes: No mediastinal, hilar, or axillary adenopathy. Trachea and esophagus are unremarkable. Thyroid unremarkable. Lungs/Pleura: Bronchiectasis in the lower lobes with mucoid impaction and associated bibasilar airspace disease. Similar findings also noted in the right middle lobe. Clustered tree-in-bud nodules seen within the superior segment of the right lower lobe, likely small airways disease/alveolitis. No effusions. Right lower lobe pulmonary nodule posteriorly measures 8 mm, stable since prior study. Musculoskeletal: Chest wall soft tissues are unremarkable. No acute bony abnormality. CT ABDOMEN PELVIS FINDINGS Hepatobiliary: No focal hepatic abnormality. Gallbladder unremarkable. Pancreas: No focal abnormality or ductal dilatation.  Mild atrophy. Spleen: No focal abnormality.  Normal size. Adrenals/Urinary Tract: Small low-density lesions within the kidneys bilaterally, likely small cysts although these cannot be fully characterized on this noncontrast study. No hydronephrosis. Adrenal glands and urinary bladder unremarkable. Stomach/Bowel: Large stool burden throughout the colon. Stomach, large and small bowel grossly unremarkable. Vascular/Lymphatic: Aortic atherosclerosis. No evidence of aneurysm or adenopathy. Reproductive: No visible focal abnormality. Other: No free fluid or free air. Musculoskeletal: No acute bony abnormality. Degenerative changes in the lumbar spine. IMPRESSION: Areas of bronchiectasis and mucoid impaction most pronounced in the lower lobes also noted in the right middle lobe. 8 mm right lower lobe pulmonary nodule, stable since prior  study. Clustered tree-in-bud nodular densities in the superior segment of the right lower lobe, likely small airways disease/alveolitis. Diffuse coronary artery disease. Aortic atherosclerosis. Large stool burden throughout the colon. Multiple bilateral low-density lesions within the kidneys, likely cysts although these cannot be fully characterized on this noncontrast study. These could be further evaluated with ultrasound or contrast-enhanced CT/MRI. Electronically Signed   By: Rolm Baptise M.D.   On: 08/16/2020 10:22    All questions were answered. The patient knows to call the clinic with any problems, questions or concerns. I spent 45 minutes in the care of this patient including H and P, review of records, counseling and coordination of care.     Benay Pike, MD 08/22/2020 5:43 PM

## 2020-08-23 LAB — FOLATE RBC
Folate, Hemolysate: 576 ng/mL
Folate, RBC: 2224 ng/mL (ref >498)
Hematocrit: 25.9 % — ABNORMAL LOW (ref 37.5–51.0)

## 2020-08-23 LAB — KAPPA/LAMBDA LIGHT CHAINS
Kappa free light chain: 111.9 mg/L — ABNORMAL HIGH (ref 3.3–19.4)
Kappa, lambda light chain ratio: 1.31 (ref 0.26–1.65)
Lambda free light chains: 85.4 mg/L — ABNORMAL HIGH (ref 5.7–26.3)

## 2020-08-24 ENCOUNTER — Other Ambulatory Visit: Payer: Self-pay | Admitting: Hematology and Oncology

## 2020-08-24 LAB — PROTEIN ELECTROPHORESIS, SERUM, WITH REFLEX
A/G Ratio: 1 (ref 0.7–1.7)
Albumin ELP: 3.5 g/dL (ref 2.9–4.4)
Alpha-1-Globulin: 0.3 g/dL (ref 0.0–0.4)
Alpha-2-Globulin: 0.9 g/dL (ref 0.4–1.0)
Beta Globulin: 1.1 g/dL (ref 0.7–1.3)
Gamma Globulin: 1.1 g/dL (ref 0.4–1.8)
Globulin, Total: 3.4 g/dL (ref 2.2–3.9)
Total Protein ELP: 6.9 g/dL (ref 6.0–8.5)

## 2020-08-25 ENCOUNTER — Telehealth: Payer: Self-pay

## 2020-08-25 NOTE — Telephone Encounter (Signed)
-----   Message from Benay Pike, MD sent at 08/23/2020  4:04 PM EST ----- That should be continued until follow up.  Thanks, ----- Message ----- From: Burna Mortimer, CMA Sent: 08/23/2020   9:53 AM EST To: Benay Pike, MD  Dr. Chryl Heck, After going over your recommendations with his Daughter, she states that they have been giving him 65 mg of ferrous sulfate BID for a month now. What now do you recommend?  Thanks ----- Message ----- From: Benay Pike, MD Sent: 08/22/2020   5:24 PM EST To: Arlice Colt [facility]  Please call the daughter and recommend that he start taking ferrous sulfate 65 mg PO BID, he has mild iron deficiency.  Thanks

## 2020-08-25 NOTE — Telephone Encounter (Signed)
[name] has been made aware of Dr. Rob Hickman recommendations.

## 2020-08-26 ENCOUNTER — Telehealth: Payer: Self-pay | Admitting: Hematology and Oncology

## 2020-08-26 NOTE — Telephone Encounter (Signed)
Scheduled appts per 3/9 sch msg. Spoke to [facility] [employer] who handles all of pts appts. She is aware of all upcoming appts.

## 2020-09-02 ENCOUNTER — Inpatient Hospital Stay: Payer: HMO

## 2020-09-02 ENCOUNTER — Other Ambulatory Visit: Payer: Self-pay

## 2020-09-02 VITALS — BP 112/44 | HR 67 | Temp 97.5°F | Resp 18

## 2020-09-02 DIAGNOSIS — E611 Iron deficiency: Secondary | ICD-10-CM

## 2020-09-02 DIAGNOSIS — D539 Nutritional anemia, unspecified: Secondary | ICD-10-CM | POA: Diagnosis not present

## 2020-09-02 MED ORDER — SODIUM CHLORIDE 0.9 % IV SOLN
200.0000 mg | Freq: Once | INTRAVENOUS | Status: AC
  Administered 2020-09-02: 200 mg via INTRAVENOUS
  Filled 2020-09-02: qty 200

## 2020-09-02 MED ORDER — SODIUM CHLORIDE 0.9 % IV SOLN
Freq: Once | INTRAVENOUS | Status: AC
  Filled 2020-09-02: qty 250

## 2020-09-02 NOTE — Patient Instructions (Signed)

## 2020-09-09 ENCOUNTER — Inpatient Hospital Stay: Payer: HMO

## 2020-09-09 ENCOUNTER — Other Ambulatory Visit: Payer: Self-pay

## 2020-09-09 VITALS — BP 108/44 | HR 74 | Temp 97.1°F | Resp 18

## 2020-09-09 DIAGNOSIS — D539 Nutritional anemia, unspecified: Secondary | ICD-10-CM | POA: Diagnosis not present

## 2020-09-09 DIAGNOSIS — E611 Iron deficiency: Secondary | ICD-10-CM

## 2020-09-09 MED ORDER — SODIUM CHLORIDE 0.9 % IV SOLN
Freq: Once | INTRAVENOUS | Status: AC
Start: 2020-09-09 — End: 2020-09-09
  Filled 2020-09-09: qty 250

## 2020-09-09 MED ORDER — SODIUM CHLORIDE 0.9 % IV SOLN
200.0000 mg | Freq: Once | INTRAVENOUS | Status: AC
  Administered 2020-09-09: 200 mg via INTRAVENOUS
  Filled 2020-09-09: qty 200

## 2020-09-09 NOTE — Patient Instructions (Signed)

## 2020-09-12 ENCOUNTER — Encounter: Payer: Self-pay | Admitting: Hematology and Oncology

## 2020-09-12 ENCOUNTER — Telehealth: Payer: Self-pay | Admitting: Hematology and Oncology

## 2020-09-12 ENCOUNTER — Inpatient Hospital Stay: Payer: HMO | Admitting: Hematology and Oncology

## 2020-09-12 ENCOUNTER — Other Ambulatory Visit: Payer: Self-pay

## 2020-09-12 DIAGNOSIS — D539 Nutritional anemia, unspecified: Secondary | ICD-10-CM | POA: Diagnosis not present

## 2020-09-12 DIAGNOSIS — D649 Anemia, unspecified: Secondary | ICD-10-CM

## 2020-09-12 DIAGNOSIS — D72829 Elevated white blood cell count, unspecified: Secondary | ICD-10-CM | POA: Insufficient documentation

## 2020-09-12 DIAGNOSIS — R634 Abnormal weight loss: Secondary | ICD-10-CM | POA: Diagnosis not present

## 2020-09-12 DIAGNOSIS — D72825 Bandemia: Secondary | ICD-10-CM | POA: Diagnosis not present

## 2020-09-12 DIAGNOSIS — E611 Iron deficiency: Secondary | ICD-10-CM

## 2020-09-12 NOTE — Telephone Encounter (Signed)
Scheduled per los. Declined printout  

## 2020-09-12 NOTE — Progress Notes (Signed)
[facility] CONSULT NOTE  Patient Care Team: Lujean Amel, MD as PCP - General (Family Medicine) Leonie Man, MD as PCP - Cardiology (Cardiology) Elayne Snare, MD as Attending Physician (Endocrinology)  CHIEF COMPLAINTS/PURPOSE OF CONSULTATION:   Anemia  ASSESSMENT & PLAN:   Normocytic normochromic anemia This is most likely related to Anemia of chronic kidney disease HE does have mild iron deficiency, he is now receiving IV iron infusion. He had severe constipation with oral iron. He will complete his 4 iron infusions with Dr Joelyn Oms his nephrologist. No other nutritional deficiency SPEP, K/L ratio with no concerns for MM At this time, I explained that he most likely anemia from CKD. He may benefit from epo once ferritin reserves improve. He will be meeting his nephrologist back in May and will likely proceed with Epo I have discussed that Epo can increase risk of VTE Again, daughter doesn't think he should go for BMB or colonoscopy. We will see him back in 3 months.  Weight loss, unintentional I have reviewed the VS He has gained some weight since last visit Encouraged adequate calorie intake and will follow up on this.  Leukocytosis Leukocytosis, mild Mostly bandemia. Ok to continue monitoring this.  Iron deficiency Ferritin of 23, couldn't tolerate oral iron He is receiving IV iron at Dr Jolaine Click ford's office Would be ideal to have ferritin close to 100 before starting epo.  No orders of the defined types were placed in this encounter.  HISTORY OF PRESENTING ILLNESS:  Clayton [name] 87 y.o. male is here because of ongoing anemia.  Clayton [name] is a 87 yr old male patient with PMH significant for BPH, CKD, COPD, DM, HTN referred to hematology for evaluation of anemia.    He is independent with all his activities of daily living, chores, walks frequently and walks about 2 miles couple times a week, plays card games every day and stays physically and  mentally active.  However daughter has noticed that he has been losing more and more weight, feels more tired more and feels more sleepy.  He eats very well and he eats healthy. He complains about cough when he is lying down and the need to sleep propped up position.  Besides his cough and fatigue is any other symptoms.  He denies any changes in breathing, bowel habits or urinary habits.  No hematochezia or melena.  He had a CT in February because he was losing weight rapidly.he also has a skin rash which was unexplained.  The CT did not show any evidence of malignancy.  He has not had colonoscopy 8 years because he was not thought to be deemed safe for the procedure.  Rest of the pertinent 10 point ROS as mentioned below and otherwise unremarkable.  Interval History  He is here with his daughter He may have gained some weight since last visit Drinking boost and eating high calorie diet He feels well. No complaints Iron caused lot of constipation for him, IV iron is going well No hematochezia or melena.  REVIEW OF SYSTEMS:   Constitutional: Denies fevers, chills or abnormal night sweats. Has lost about 20 lbs in 2 yrs Eyes: Denies blurriness of vision, double vision or watery eyes Ears, nose, mouth, throat, and face: Denies mucositis or sore throat Respiratory: cough when he lays down to sleep. Cardiovascular: Denies palpitation, chest discomfort or lower extremity swelling Gastrointestinal:  Denies nausea, heartburn or change in bowel habits Skin: He does have skin rash on his  legs and back for which he follows up with dermatology. Lymphatics: Denies new lymphadenopathy or easy bruising Neurological: Denies numbness, tingling or new weaknesses Behavioral/Psych: Mood is stable, no new changes  All other systems were reviewed with the patient and are negative.  MEDICAL HISTORY:  Past Medical History:  Diagnosis Date  . Anemia   . BPH (benign prostatic hyperplasia)   . CAD (coronary  artery disease)    hx of stemi  04-2018   . Chronic renal failure   . Chronic renal insufficiency   . COPD (chronic obstructive pulmonary disease) ([place])   . DDD (degenerative disc disease), lumbar   . Diabetes mellitus   . Hyperlipidemia   . Hyperparathyroidism ([place])   . Hypertension   . IDA (iron deficiency anemia)   . Neuromuscular disorder ([place])   . OSA on CPAP   . Osteomyelitis of forearm ([place])   . Osteoporosis     SURGICAL HISTORY: Past Surgical History:  Procedure Laterality Date  . CORONARY STENT INTERVENTION N/A 04/20/2018   Procedure: CORONARY STENT INTERVENTION;  Surgeon: Leonie Man, MD;  Location: [facility] INVASIVE CV LAB;;;    . [place] [place] ACUTE MI REVASCULARIZATION N/A 04/20/2018   Procedure: Coronary/Graft Acute MI Revascularization;  Surgeon: Leonie Man, MD;  Location: [facility] CV LAB;  Service: Cardiovascular;  Laterality: N/A; --> after initial PTCA restoring flow down the RCA, there was diffuse proximal to mid disease treated with a DES SYNERGY 3 X 38 ( 3.55mm)  . EYE SURGERY    . HOLTER MONITOR  05/2018   Sinus rhythm noted with sinus bradycardia.  Also sinus rhythm with 2-1 AV block noted.  Maximum heart rate was sinus tachycardia 123 bpm.  Rare PVCs accelerated idioventricular rhythm noted.  Intermittent Wenckebach block noted.  . I & D EXTREMITY Right 08/13/2016   Procedure: IRRIGATION AND DEBRIDEMENT RIGHT ELBOW AND HAND;  Surgeon: Milly Jakob, MD;  Location: [facility] ORS;  Service: Orthopedics;  Laterality: Right;  . LEFT HEART CATH AND CORONARY ANGIOGRAPHY N/A 04/20/2018   Procedure: LEFT HEART CATH AND CORONARY ANGIOGRAPHY;  Surgeon: Leonie Man, MD;  Location: [facility] CV LAB; mRCA 100%&80% (after 55% tapering in pRCA), mLM-ostLAD 40% w/ 85% ostCx, ost-pCx 65% @ Om1 w/ ost 85%. pLAD 60% & 50% after D1 with distal 75%, ostD1 65% & 90% after small side branch.    . TRANSTHORACIC ECHOCARDIOGRAM  04/21/2018   EF 50-55%. Posterior Wall HK. Severe RV  HK, dilated IVC. Temp wire in place  . VASECTOMY    . VIDEO BRONCHOSCOPY Bilateral 08/25/2012   Procedure: VIDEO BRONCHOSCOPY WITHOUT FLUORO;  Surgeon: Brand Males, MD;  Location: [facility];  Service: Cardiopulmonary;  Laterality: Bilateral;    SOCIAL HISTORY: Social History   Socioeconomic History  . Marital status: Widowed    Spouse name: Not on file  . Number of children: 4  . Years of education: 16  . Highest education level: Bachelor's degree (e.g., BA, AB, BS)  Occupational History  . Occupation: retired  Tobacco Use  . Smoking status: Former Smoker    Packs/day: 3.00    Years: 33.00    Pack years: 99.00    Types: Cigarettes    Quit date: 06/19/1973    Years since quitting: 47.2  . Smokeless tobacco: Former Systems developer    Quit date: 09/26/1975  Vaping Use  . Vaping Use: Never used  Substance and Sexual Activity  . Alcohol use: No  . Drug use: No  . Sexual activity:  Not Currently  Other Topics Concern  . Not on file  Social History Narrative   He is accompanied by his daughter who seems to be quite involved with his care.      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 He also went to the [facility] several times a week until he began to have symptoms.    Social Determinants of Health   Financial Resource Strain: Not on file  Food Insecurity: Not on file  Transportation Needs: Not on file  Physical Activity: Not on file  Stress: Not on file  Social Connections: Not on file  Intimate Partner Violence: Not on file    FAMILY HISTORY: Family History  Problem Relation Age of Onset  . Diabetes Mellitus II Other   . Hypertension Other   . Other Father        complications from strep  . CVA Father   . Cancer Sister   . Other Brother        hip replacement complications     ALLERGIES:  is allergic to doxycycline, levaquin [levofloxacin in d5w], aliskiren, lisinopril, losartan potassium, nitrous oxide, penicillins, tape, and  diltiazem.  MEDICATIONS:  Current Outpatient Medications  Medication Sig Dispense Refill  . ACCU-CHEK SOFTCLIX LANCETS lancets TEST BLOOD SUGAR FOUR TIMES DAILY 400 each 1  . albuterol (PROVENTIL HFA;VENTOLIN HFA) 108 (90 Base) MCG/ACT inhaler Inhale 1-2 puffs into the lungs every 6 (six) hours as needed for wheezing or shortness of breath.    . Alcohol Swabs (B-D SINGLE USE SWABS REGULAR) PADS USE 7 SWABS DAILY AS DIRECTED 600 each 0  . atorvastatin (LIPITOR) 80 MG tablet TAKE 1/2 TABLET(40 MG) BY MOUTH DAILY 45 tablet 2  . benzonatate (TESSALON) 100 MG capsule Take by mouth 3 (three) times daily as needed for cough. (Patient not taking: Reported on 08/22/2020)    . BRILINTA 60 MG TABS tablet TAKE 1 TABLET(60 MG) BY MOUTH TWICE DAILY 60 tablet 5  . budesonide-formoterol (SYMBICORT) 160-4.5 MCG/ACT inhaler Inhale 2 puffs into the lungs 2 (two) times daily. 10.2 g 5  . cholecalciferol (VITAMIN D) 1000 units tablet Take 2,000 Units by mouth daily.    . Continuous Blood Gluc Receiver (FREESTYLE LIBRE 14 DAY READER) DEVI 1 each by Does not apply route every 14 (fourteen) days. Use reader to monitor blood sugar continuously with freestyle libre sensor. 1 each 0  . DROPLET INSULIN SYRINGE 31G X 5/16" 0.3 ML MISC USE THREE TIMES DAILY 300 each 2  . Ferrous Sulfate (IRON PO) Take by mouth. (Patient not taking: Reported on 09/12/2020)    . finasteride (PROSCAR) 5 MG tablet TAKE 1 TABLET EVERY DAY 90 tablet 1  . furosemide (LASIX) 20 MG tablet Take 1 tablet (20 mg total) by mouth 2 (two) times daily. Take seocnd dose about 12 noon each daily 180 tablet 3  . glucose blood test strip Use as instructed to test blood sugars 4 times daily 150 each 12  . insulin aspart (NOVOLOG) 100 UNIT/ML injection INJECT 7 UNITS SUBCUTANEOUSLY IN THE MORNING, 6 UNITS AT LUNCH, AND 9 UNITS AT SUPPER (DISCARD VIAL 28 DAYS AFTER OPENING) 30 mL 1  . isosorbide mononitrate (IMDUR) 30 MG 24 hr tablet TAKE 1 TABLET BY MOUTH DAILY 30  tablet 8  . LANTUS 100 UNIT/ML injection INJECT  26  UNITS SUBCUTANEOUSLY EVERY MORNING (DISCARD VIAL 28 DAYS AFTER OPENING) (Patient taking differently: Inject 16 Units into the skin daily.) 30  mL 2  . Melatonin 5 MG TABS Take 2 tablets by mouth at bedtime.     . montelukast (SINGULAIR) 10 MG tablet     . Multiple Vitamin (MULTIVITAMIN WITH MINERALS) TABS tablet Take 1 tablet by mouth daily.    . nitroGLYCERIN (NITROSTAT) 0.4 MG SL tablet Place 1 tablet (0.4 mg total) under the tongue every 5 (five) minutes as needed for chest pain. 25 tablet 6  . omeprazole (PRILOSEC) 20 MG capsule Take 1 capsule (20 mg total) by mouth daily. 30 capsule 3  . oxybutynin (DITROPAN) 5 MG tablet      No current facility-administered medications for this visit.     PHYSICAL EXAMINATION:  ECOG PERFORMANCE STATUS: 0 - Asymptomatic  Vitals:   09/12/20 0946  BP: (!) 134/50  Pulse: 90  Resp: 17  Temp: 97.7 F (36.5 C)  SpO2: 100%   Filed Weights   09/12/20 0946  Weight: 180 lb 12.8 oz (82 kg)    PE deferred in lieu of counseling.  LABORATORY DATA:  I have reviewed the data as listed Lab Results  Component Value Date   WBC 12.8 (H) 08/22/2020   HGB 8.1 (L) 08/22/2020   HCT 25.9 (L) 08/22/2020   MCV 100.4 (H) 08/22/2020   PLT 353 08/22/2020     Chemistry      Component Value Date/Time   NA 140 08/22/2020 1200   K 3.7 08/22/2020 1200   CL 105 08/22/2020 1200   CO2 24 08/22/2020 1200   BUN 85 (H) 08/22/2020 1200   CREATININE 3.10 (HH) 08/22/2020 1200   GLU 146 03/09/2020 0000      Component Value Date/Time   CALCIUM 8.8 (L) 08/22/2020 1200   ALKPHOS 121 08/22/2020 1200   AST 25 08/22/2020 1200   ALT 22 08/22/2020 1200   BILITOT 0.2 (L) 08/22/2020 1200       RADIOGRAPHIC STUDIES: I have personally reviewed the radiological images as listed and agreed with the findings in the report. CT CHEST ABDOMEN PELVIS WO CONTRAST  Result Date: 08/16/2020 CLINICAL DATA:  Chronic kidney  disease. Former smoker. Weight loss. EXAM: CT CHEST, ABDOMEN AND PELVIS WITHOUT CONTRAST TECHNIQUE: Multidetector CT imaging of the chest, abdomen and pelvis was performed following the standard protocol without IV contrast. COMPARISON:  12/08/2015 FINDINGS: CT CHEST FINDINGS Cardiovascular: Cardiomegaly. Diffuse coronary artery and scattered aortic calcifications. No aneurysm. Mediastinum/Nodes: No mediastinal, hilar, or axillary adenopathy. Trachea and esophagus are unremarkable. Thyroid unremarkable. Lungs/Pleura: Bronchiectasis in the lower lobes with mucoid impaction and associated bibasilar airspace disease. Similar findings also noted in the right middle lobe. Clustered tree-in-bud nodules seen within the superior segment of the right lower lobe, likely small airways disease/alveolitis. No effusions. Right lower lobe pulmonary nodule posteriorly measures 8 mm, stable since prior study. Musculoskeletal: Chest wall soft tissues are unremarkable. No acute bony abnormality. CT ABDOMEN PELVIS FINDINGS Hepatobiliary: No focal hepatic abnormality. Gallbladder unremarkable. Pancreas: No focal abnormality or ductal dilatation.  Mild atrophy. Spleen: No focal abnormality.  Normal size. Adrenals/Urinary Tract: Small low-density lesions within the kidneys bilaterally, likely small cysts although these cannot be fully characterized on this noncontrast study. No hydronephrosis. Adrenal glands and urinary bladder unremarkable. Stomach/Bowel: Large stool burden throughout the colon. Stomach, large and small bowel grossly unremarkable. Vascular/Lymphatic: Aortic atherosclerosis. No evidence of aneurysm or adenopathy. Reproductive: No visible focal abnormality. Other: No free fluid or free air. Musculoskeletal: No acute bony abnormality. Degenerative changes in the lumbar spine. IMPRESSION: Areas of bronchiectasis and mucoid impaction  most pronounced in the lower lobes also noted in the right middle lobe. 8 mm right lower lobe  pulmonary nodule, stable since prior study. Clustered tree-in-bud nodular densities in the superior segment of the right lower lobe, likely small airways disease/alveolitis. Diffuse coronary artery disease. Aortic atherosclerosis. Large stool burden throughout the colon. Multiple bilateral low-density lesions within the kidneys, likely cysts although these cannot be fully characterized on this noncontrast study. These could be further evaluated with ultrasound or contrast-enhanced CT/MRI. Electronically Signed   By: Rolm Baptise M.D.   On: 08/16/2020 10:22   We reviewed all labs from last visit.  All questions were answered. The patient knows to call the clinic with any problems, questions or concerns. I spent 30 minutes in the care of this patient including H and P, review of records, counseling and coordination of care.     Benay Pike, MD 09/12/2020 11:39 AM

## 2020-09-12 NOTE — Assessment & Plan Note (Signed)
Ferritin of 23, couldn't tolerate oral iron He is receiving IV iron at Dr Jolaine Click ford's office Would be ideal to have ferritin close to 100 before starting epo.

## 2020-09-12 NOTE — Assessment & Plan Note (Addendum)
This is most likely related to Anemia of chronic kidney disease HE does have mild iron deficiency, he is now receiving IV iron infusion. He had severe constipation with oral iron. He will complete his 4 iron infusions with Dr Joelyn Oms his nephrologist. No other nutritional deficiency SPEP, K/L ratio with no concerns for MM At this time, I explained that he most likely anemia from CKD. He may benefit from epo once ferritin reserves improve. He will be meeting his nephrologist back in May and will likely proceed with Epo I have discussed that Epo can increase risk of VTE Again, daughter doesn't think he should go for BMB or colonoscopy. We will see him back in 3 months.

## 2020-09-12 NOTE — Assessment & Plan Note (Signed)
I have reviewed the VS He has gained some weight since last visit Encouraged adequate calorie intake and will follow up on this.

## 2020-09-12 NOTE — Assessment & Plan Note (Signed)
Leukocytosis, mild Mostly bandemia. Ok to continue monitoring this.

## 2020-09-16 ENCOUNTER — Inpatient Hospital Stay: Payer: HMO | Attending: Hematology and Oncology

## 2020-09-16 ENCOUNTER — Other Ambulatory Visit: Payer: Self-pay

## 2020-09-16 VITALS — BP 129/49 | HR 75 | Temp 98.0°F | Resp 16

## 2020-09-16 DIAGNOSIS — Z79899 Other long term (current) drug therapy: Secondary | ICD-10-CM | POA: Diagnosis not present

## 2020-09-16 DIAGNOSIS — E611 Iron deficiency: Secondary | ICD-10-CM

## 2020-09-16 DIAGNOSIS — D509 Iron deficiency anemia, unspecified: Secondary | ICD-10-CM | POA: Diagnosis present

## 2020-09-16 MED ORDER — SODIUM CHLORIDE 0.9 % IV SOLN
200.0000 mg | Freq: Once | INTRAVENOUS | Status: AC
  Administered 2020-09-16: 200 mg via INTRAVENOUS
  Filled 2020-09-16: qty 200

## 2020-09-16 MED ORDER — SODIUM CHLORIDE 0.9 % IV SOLN
Freq: Once | INTRAVENOUS | Status: AC
  Filled 2020-09-16: qty 250

## 2020-09-16 NOTE — Patient Instructions (Signed)

## 2020-09-21 ENCOUNTER — Other Ambulatory Visit: Payer: Self-pay | Admitting: *Deleted

## 2020-09-21 ENCOUNTER — Telehealth: Payer: Self-pay | Admitting: Endocrinology

## 2020-09-21 DIAGNOSIS — Z794 Long term (current) use of insulin: Secondary | ICD-10-CM

## 2020-09-21 DIAGNOSIS — E1165 Type 2 diabetes mellitus with hyperglycemia: Secondary | ICD-10-CM

## 2020-09-21 MED ORDER — INSULIN GLARGINE 100 UNIT/ML ~~LOC~~ SOLN: 16.0000 [IU] | Freq: Every day | SUBCUTANEOUS | 2 refills | Status: DC

## 2020-09-21 NOTE — Telephone Encounter (Signed)
MEDICATION: lantus  PHARMACY:   [place], [place] AT [facility] OF [address] Phone:  [phone]  Fax:  [phone]      HAS THE PATIENT CONTACTED THEIR PHARMACY?  no  IS THIS A 90 DAY SUPPLY : yes  IS PATIENT OUT OF MEDICATION: no  IF NOT; HOW MUCH IS LEFT: a few days  LAST APPOINTMENT DATE: @3 /09/2020  NEXT APPOINTMENT DATE:@5 /08/2020  DO WE HAVE YOUR PERMISSION TO LEAVE A DETAILED MESSAGE?:  OTHER COMMENTS:    **Let patient know to contact pharmacy at the end of the day to make sure medication is ready. **  ** Please notify patient to allow 48-72 hours to process**  **Encourage patient to contact the pharmacy for refills or they can request refills through [facility]**

## 2020-09-23 ENCOUNTER — Ambulatory Visit: Payer: HMO

## 2020-09-23 ENCOUNTER — Other Ambulatory Visit: Payer: Self-pay | Admitting: *Deleted

## 2020-09-23 ENCOUNTER — Other Ambulatory Visit: Payer: Self-pay

## 2020-09-23 ENCOUNTER — Inpatient Hospital Stay: Payer: HMO

## 2020-09-23 VITALS — BP 123/48 | HR 79 | Temp 98.4°F | Resp 17

## 2020-09-23 DIAGNOSIS — E611 Iron deficiency: Secondary | ICD-10-CM

## 2020-09-23 DIAGNOSIS — D509 Iron deficiency anemia, unspecified: Secondary | ICD-10-CM | POA: Diagnosis not present

## 2020-09-23 MED ORDER — SODIUM CHLORIDE 0.9 % IV SOLN
Freq: Once | INTRAVENOUS | Status: AC
  Filled 2020-09-23: qty 250

## 2020-09-23 MED ORDER — SODIUM CHLORIDE 0.9 % IV SOLN
200.0000 mg | Freq: Once | INTRAVENOUS | Status: AC
  Administered 2020-09-23: 200 mg via INTRAVENOUS
  Filled 2020-09-23: qty 200

## 2020-09-23 MED ORDER — TICAGRELOR 60 MG PO TABS: ORAL_TABLET | ORAL | 5 refills | Status: DC

## 2020-09-23 NOTE — Patient Instructions (Signed)

## 2020-09-27 ENCOUNTER — Other Ambulatory Visit: Payer: Self-pay | Admitting: Endocrinology

## 2020-10-18 ENCOUNTER — Other Ambulatory Visit: Payer: Self-pay

## 2020-10-18 ENCOUNTER — Encounter: Payer: Self-pay | Admitting: Endocrinology

## 2020-10-18 ENCOUNTER — Ambulatory Visit: Payer: HMO | Admitting: Endocrinology

## 2020-10-18 VITALS — BP 120/64 | HR 79 | Ht 72.0 in | Wt 184.2 lb

## 2020-10-18 DIAGNOSIS — E1165 Type 2 diabetes mellitus with hyperglycemia: Secondary | ICD-10-CM

## 2020-10-18 DIAGNOSIS — Z794 Long term (current) use of insulin: Secondary | ICD-10-CM

## 2020-10-18 LAB — POCT GLYCOSYLATED HEMOGLOBIN (HGB A1C): Hemoglobin A1C: 6 % — AB (ref 4.0–5.6)

## 2020-10-18 NOTE — Progress Notes (Signed)
Patient ID: Clayton [name], male   DOB: 23-Feb-1928, 87 y.o.   MRN: [id number]     Reason for Appointment: Followup of diabetes and various issues  History of Present Illness   Type 2 DIABETES MELITUS diagnosed 1989  He has had long-standing diabetes and has been on basal bolus insulin for the last few years Since 2014 he has required larger doses of insulin especially with having to take periodic courses of steroids for his pulmonary problems Also his Actos was stopped because of tendency to edema and metformin stopped because of renal dysfunction  Insulin regimen: Novolog 6-8-5/8 units before meals, Lantus 20units in a.m., using syringes  A1c is now 6, previously 6.5 compared to 6.7 He has had anemia GMI is 6.9  Current blood sugar patterns and problems identified:  He has gone up to 20 units of Lantus reportedly on his own, previously taking 14 units  He normally adjusts insulin on his own if he has high readings  He is still getting about the same time in target as his last visit  Although his nephrologist was concerned about weight loss he appears to be recently gaining weight  He has been told to increase his caloric intake and is adding some ice cream at dinnertime  Again mostly adjusting his mealtime dose based on the Premeal blood sugars, last evening took 8 units  Although he still has some periodic high readings after meals overall blood sugars are still reasonably controlled  Previously his freestyle libre was about 10-15 mg lower than the actual fingerstick reading  He still is comfortable using the syringes for his insulin  No hypoglycemia reported  Blood sugar patterns from the CGM download are interpreted as follows   Blood sugars are on an midmorning  However also has some hyperglycemic episodes periodically mid afternoon and early evening with some trending higher readings at bedtime  Lowest blood sugars are early morning before 6 AM  No  hypoglycemia  Overnight blood sugars are averaging about 170 at midnight and then decrease until about 6 AM and then rise  Postprandial readings are variable but occasional blood sugar spikes after all meals but not to a significant extent   CGM use % of time  92  2-week average/GV  153/25  Time in range  79     %  % Time Above 180  20+1  % Time above 250   % Time Below 70 0     PRE-MEAL Fasting Lunch Dinner Bedtime Overall  Glucose range:       Averages:  128  138  125  167  152   POST-MEAL PC Breakfast PC Lunch PC Dinner  Glucose range:     Averages:  166  154  159    Previous data:  CGM use % of time  99  2-week average/GV  159/20  Time in range    78    %  % Time Above 180  22  % Time above 250   % Time Below 70 0     PRE-MEAL Fasting Lunch Dinner Bedtime Overall  Glucose range:       Averages:  153  128  161  159   POST-MEAL PC Breakfast PC Lunch PC Dinner  Glucose range:     Averages:  167  172  182     Meals: 3 meals per day.  breakfast usually about 5 AM: oatmeal / grits,  with peanut butter, for lunch he will have a sandwich with apple around 11 AM  Dinner usually at 5 PM-6 PM       Weight history:  Wt Readings from Last 3 Encounters:  10/18/20 184 lb 3.2 oz (83.6 kg)  09/12/20 180 lb 12.8 oz (82 kg)  08/22/20 173 lb 11.2 oz (78.8 kg)   LABS:  Lab Results  Component Value Date   HGBA1C 6.0 (A) 10/18/2020   HGBA1C 6.5 (A) 07/19/2020   HGBA1C 6.7 03/29/2020   Lab Results  Component Value Date   MICROALBUR 5.4 (H) 08/07/2018   LDLCALC 48 07/19/2020   CREATININE 3.10 ([place]) 08/22/2020   Problem 2:  Chronic kidney disease:  He has had chronic renal insufficiency, creatinine was 1.7 in 2014 with slow progression subsequently.  This has been related to nephropathy and glomerulosclerosis Has been evaluated and followed by nephrologist   Lab Results  Component Value Date   CREATININE 3.10 (HH) 08/22/2020   CREATININE 3.11 (H) 07/19/2020    CREATININE 2.25 (H) 06/04/2019   CREATININE 2.50 (H) 08/07/2018   He has been prescribed Lasix 20 mg daily, last prescription done by his cardiologist    Allergies as of 10/18/2020      Reactions   Doxycycline Rash   Levaquin [levofloxacin In D5w]    Caused chest pain and heartburn   Aliskiren Other (See Comments)   Lisinopril Other (See Comments)   Losartan Potassium Other (See Comments)   Nitrous Oxide Other (See Comments)   Reaction:  Unknown    Penicillins Other (See Comments)   Reaction:  Unknown Has patient had a PCN reaction causing immediate rash, facial/tongue/throat swelling, SOB or lightheadedness with hypotension: Unsure Has patient had a PCN reaction causing severe rash involving mucus membranes or skin necrosis: Unsure Has patient had a PCN reaction that required hospitalization Unsure  Has patient had a PCN reaction occurring within the last 10 years: No If all of the above answers are "NO", then may proceed with Cephalosporin use.   Tape Other (See Comments)   Reaction:  Tears pts skin    Diltiazem Rash      Medication List       Accurate as of Oct 18, 2020  9:17 AM. If you have any questions, ask your nurse or doctor.        Accu-Chek Softclix Lancets lancets TEST BLOOD SUGAR FOUR TIMES DAILY   albuterol 108 (90 Base) MCG/ACT inhaler Commonly known as: VENTOLIN HFA Inhale 1-2 puffs into the lungs every 6 (six) hours as needed for wheezing or shortness of breath.   atorvastatin 80 MG tablet Commonly known as: LIPITOR TAKE 1/2 TABLET(40 MG) BY MOUTH DAILY   B-D SINGLE USE SWABS REGULAR Pads USE 7 SWABS DAILY AS DIRECTED   benzonatate 100 MG capsule Commonly known as: TESSALON Take by mouth 3 (three) times daily as needed for cough.   budesonide-formoterol 160-4.5 MCG/ACT inhaler Commonly known as: Symbicort Inhale 2 puffs into the lungs 2 (two) times daily.   cholecalciferol 1000 units tablet Commonly known as: VITAMIN D Take 2,000 Units by  mouth daily.   Droplet Insulin Syringe 31G X 5/16" 0.3 ML Misc Generic drug: Insulin Syringe-Needle U-100 USE THREE TIMES DAILY   finasteride 5 MG tablet Commonly known as: PROSCAR TAKE 1 TABLET EVERY DAY   FreeStyle Libre 14 Day Reader Kerrin Mo 1 each by Does not apply route every 14 (fourteen) days. Use reader to monitor blood sugar continuously with freestyle libre sensor.  FreeStyle Libre 14 Day Sensor Misc APPLY EVERY 14 DAYS TO MONITOR BLOOD SUGAR   furosemide 20 MG tablet Commonly known as: LASIX Take 1 tablet (20 mg total) by mouth 2 (two) times daily. Take seocnd dose about 12 noon each daily   glucose blood test strip Use as instructed to test blood sugars 4 times daily   insulin aspart 100 UNIT/ML injection Commonly known as: novoLOG INJECT 7 UNITS SUBCUTANEOUS EVERY MORNING, 6 UNITS AT LUNCH, AND 9 UNITS AT SUPPER   insulin glargine 100 UNIT/ML injection Commonly known as: Lantus Inject 0.16 mLs (16 Units total) into the skin daily.   IRON PO Take by mouth.   isosorbide mononitrate 30 MG 24 hr tablet Commonly known as: IMDUR TAKE 1 TABLET BY MOUTH DAILY   melatonin 5 MG Tabs Take 2 tablets by mouth at bedtime.   montelukast 10 MG tablet Commonly known as: SINGULAIR   multivitamin with minerals Tabs tablet Take 1 tablet by mouth daily.   nitroGLYCERIN 0.4 MG SL tablet Commonly known as: NITROSTAT Place 1 tablet (0.4 mg total) under the tongue every 5 (five) minutes as needed for chest pain.   omeprazole 20 MG capsule Commonly known as: PRILOSEC Take 1 capsule (20 mg total) by mouth daily.   oxybutynin 5 MG tablet Commonly known as: DITROPAN   ticagrelor 60 MG Tabs tablet Commonly known as: Brilinta TAKE 1 TABLET(60 MG) BY MOUTH TWICE DAILY       Allergies:  Allergies  Allergen Reactions  . Doxycycline Rash  . Levaquin [Levofloxacin In D5w]     Caused chest pain and heartburn  . Aliskiren Other (See Comments)  . Lisinopril Other (See  Comments)  . Losartan Potassium Other (See Comments)  . Nitrous Oxide Other (See Comments)    Reaction:  Unknown   . Penicillins Other (See Comments)    Reaction:  Unknown Has patient had a PCN reaction causing immediate rash, facial/tongue/throat swelling, SOB or lightheadedness with hypotension: Unsure Has patient had a PCN reaction causing severe rash involving mucus membranes or skin necrosis: Unsure Has patient had a PCN reaction that required hospitalization Unsure  Has patient had a PCN reaction occurring within the last 10 years: No If all of the above answers are "NO", then may proceed with Cephalosporin use.  . Tape Other (See Comments)    Reaction:  Tears pts skin   . Diltiazem Rash    Past Medical History:  Diagnosis Date  . Anemia   . BPH (benign prostatic hyperplasia)   . CAD (coronary artery disease)    hx of stemi  04-2018   . Chronic renal failure   . Chronic renal insufficiency   . COPD (chronic obstructive pulmonary disease) ([place])   . DDD (degenerative disc disease), lumbar   . Diabetes mellitus   . Hyperlipidemia   . Hyperparathyroidism ([place])   . Hypertension   . IDA (iron deficiency anemia)   . Neuromuscular disorder ([place])   . OSA on CPAP   . Osteomyelitis of forearm ([place])   . Osteoporosis     Past Surgical History:  Procedure Laterality Date  . CORONARY STENT INTERVENTION N/A 04/20/2018   Procedure: CORONARY STENT INTERVENTION;  Surgeon: Leonie Man, MD;  Location: [facility] INVASIVE CV LAB;;;    . [facility] [place] ACUTE MI REVASCULARIZATION N/A 04/20/2018   Procedure: Coronary/Graft Acute MI Revascularization;  Surgeon: Leonie Man, MD;  Location: [place] CV LAB;  Service: Cardiovascular;  Laterality: N/A; --> after initial PTCA restoring  flow down the RCA, there was diffuse proximal to mid disease treated with a DES SYNERGY 3 X 38 ( 3.6mm)  . EYE SURGERY    . HOLTER MONITOR  05/2018   Sinus rhythm noted with sinus bradycardia.  Also sinus rhythm  with 2-1 AV block noted.  Maximum heart rate was sinus tachycardia 123 bpm.  Rare PVCs accelerated idioventricular rhythm noted.  Intermittent Wenckebach block noted.  . I & D EXTREMITY Right 08/13/2016   Procedure: IRRIGATION AND DEBRIDEMENT RIGHT ELBOW AND HAND;  Surgeon: Milly Jakob, MD;  Location: [facility] ORS;  Service: Orthopedics;  Laterality: Right;  . LEFT HEART CATH AND CORONARY ANGIOGRAPHY N/A 04/20/2018   Procedure: LEFT HEART CATH AND CORONARY ANGIOGRAPHY;  Surgeon: Leonie Man, MD;  Location: [place] CV LAB; mRCA 100%&80% (after 55% tapering in pRCA), mLM-ostLAD 40% w/ 85% ostCx, ost-pCx 65% @ Om1 w/ ost 85%. pLAD 60% & 50% after D1 with distal 75%, ostD1 65% & 90% after small side branch.    . TRANSTHORACIC ECHOCARDIOGRAM  04/21/2018   EF 50-55%. Posterior Wall HK. Severe RV HK, dilated IVC. Temp wire in place  . VASECTOMY    . VIDEO BRONCHOSCOPY Bilateral 08/25/2012   Procedure: VIDEO BRONCHOSCOPY WITHOUT FLUORO;  Surgeon: Brand Males, MD;  Location: [place];  Service: Cardiopulmonary;  Laterality: Bilateral;    Family History  Problem Relation Age of Onset  . Diabetes Mellitus II Other   . Hypertension Other   . Other Father        complications from strep  . CVA Father   . Cancer Sister   . Other Brother        hip replacement complications     Social History:  reports that he quit smoking about 47 years ago. His smoking use included cigarettes. He has a 99.00 pack-year smoking history. He quit smokeless tobacco use about 45 years ago. He reports that he does not drink alcohol and does not use drugs.  Review of Systems  BLOOD PRESSURE:  Not on any specific treatment  BP Readings from Last 3 Encounters:  10/18/20 120/64  09/23/20 (!) 123/48  09/16/20 (!) 129/49   Taking Lasix for edema, taking 20 mg twice daily, dose adjusted by nephrologist and cardiologist   ANEMIA:   He has a  history of anemia related to renal dysfunction and mild iron  deficiency  Has been treated with iron infusions recently again   Lab Results  Component Value Date   WBC 12.8 (H) 08/22/2020   HGB 8.1 (L) 08/22/2020   HCT 25.9 (L) 08/22/2020   MCV 100.4 (H) 08/22/2020   PLT 353 08/22/2020   Lab Results  Component Value Date   IRON 27 (L) 08/22/2020   TIBC 365 08/22/2020   FERRITIN 23 (L) 08/22/2020    He has had sleep apnea, using CPAP and this is being adjusted by pulmonologist.     HYPERLIPIDEMIA:     He is on atorvastatin since his MI prescribed by cardiologist, last LDL:  Lab Results  Component Value Date   CHOL 103 07/19/2020   HDL 43.20 07/19/2020   LDLCALC 48 07/19/2020   TRIG 60.0 07/19/2020   CHOLHDL 2 07/19/2020   Lab Results  Component Value Date   ALT 22 08/22/2020        Examination:   BP 120/64   Pulse 79   Ht 6' (1.829 m)   Wt 184 lb 3.2 oz (83.6 kg)   SpO2 97%  BMI 24.98 kg/m   Body mass index is 24.98 kg/m.   No ankle edema and minimal rash present  Assesment/PLAN:   1. Diabetes type 2 with obesity treated with basal bolus insulin  See history of present illness for detailed discussion of current diabetes management, blood sugar patterns and problems identifie  A1c is excellent at 6.0  His A1c may be falsely low because of anemia and GMI is 6.9 from his freestyle libre Previously freestyle Elenor Legato appears to be reading about 10-15% lower than the actual fingersticks reading   Although he is still using syringes he still appears to be trying to be consistent with doing his insulin doses without difficulty Has no difficulty with adjusting his Lantus periodically and has gone up to 20 units which appears to be appropriate Has some tendency to higher readings after meals but only occasionally over 200 when he is underestimating his coverage He still does not feel comfortable counting carbohydrates or adding more insulin for higher glycemic index foods  Also activity level is generally  decreased Currently no change needed in his insulin regimen For his age his level of control is excellent Discussed that if he is eating larger carbohydrates or desserts that suppertime he can go up to 10 units on his NovoLog especially if Premeal blood sugar is higher Also to adjust the dose at lunchtime if needed based on what he is eating No change in Lantus unless morning sugars are consistently out of target range which was discussed  2.   Chronic kidney disease: He has upcoming visit next month and will defer any lab work to them  History of edema: This is resolved   There are no Patient Instructions on file for this visit.  Follow-up in 4 months   Elayne Snare  10/18/2020, 9:17 AM

## 2020-10-31 LAB — HM DIABETES EYE EXAM

## 2020-11-01 ENCOUNTER — Encounter: Payer: Self-pay | Admitting: Endocrinology

## 2020-11-07 ENCOUNTER — Other Ambulatory Visit (HOSPITAL_COMMUNITY): Payer: Self-pay

## 2020-11-08 ENCOUNTER — Ambulatory Visit (HOSPITAL_COMMUNITY)
Admission: RE | Admit: 2020-11-08 | Discharge: 2020-11-08 | Disposition: A | Discharge status: Home / Self Care | Payer: HMO | Source: Ambulatory Visit | Attending: Nephrology | Admitting: Nephrology

## 2020-11-08 ENCOUNTER — Other Ambulatory Visit: Payer: Self-pay

## 2020-11-08 DIAGNOSIS — D631 Anemia in chronic kidney disease: Secondary | ICD-10-CM | POA: Insufficient documentation

## 2020-11-08 DIAGNOSIS — N189 Chronic kidney disease, unspecified: Secondary | ICD-10-CM | POA: Diagnosis not present

## 2020-11-08 LAB — PREPARE RBC (CROSSMATCH)

## 2020-11-08 LAB — ABO/RH: ABO/RH(D): B POS

## 2020-11-08 MED ORDER — SODIUM CHLORIDE 0.9% IV SOLUTION: Freq: Once | INTRAVENOUS | Status: DC

## 2020-11-08 MED ORDER — SODIUM CHLORIDE 0.9 % IV SOLN
510.0000 mg | INTRAVENOUS | Status: DC
  Administered 2020-11-08: 510 mg via INTRAVENOUS
  Filled 2020-11-08: qty 17

## 2020-11-09 LAB — TYPE AND SCREEN
ABO/RH(D): B POS
Antibody Screen: NEGATIVE
Unit division: 0

## 2020-11-09 LAB — BPAM RBC
Blood Product Expiration Date: 202206152359
ISSUE DATE / TIME: 202205241000
Unit Type and Rh: 7300

## 2020-11-11 ENCOUNTER — Encounter: Payer: Self-pay | Admitting: Hematology and Oncology

## 2020-11-11 NOTE — Telephone Encounter (Signed)
Spoke to Daughter appointment made for 11/15/20 with Dr Ellyn Hack to [email or web address]

## 2020-11-14 NOTE — Progress Notes (Signed)
Primary Care Provider: Lujean Amel, MD Cardiologist: Glenetta Hew, MD Electrophysiologist: None  Clinic Note: Chief Complaint  Patient presents with  . Follow-up    Extreme fatigue, weight loss  . Coronary Artery Disease    No angina despite anemia  . Anemia    Unsure of etiology, giving Feraheme and transfusions  . Bradycardia    History of AV block, noted on EKG today.    ===================================  ASSESSMENT/PLAN   Problem List Items Addressed This Visit    Iron deficiency    Iron deficiency anemia with ongoing infusions of Feraheme and blood transfusions.  To avoid any bleeding issues, we will have him hold Eliquis for a week, then fully stopped.  We will restart 81 mg aspirin.      CAD, multiple vessel - Primary (Chronic)    Doing remarkably well.  He has significant disease other than the RCA there is intervened on.  Moderate LAD disease and significant ostial circumflex disease being treated medically.  No active angina despite being quite anemic.  No longer walking 3/4 mile daily because of fatigue, but this  Not able to tolerate ARB or beta-blocker because of bradycardia and hypotension. Imdur and statin.  On maintenance dose Brilinta--with his anemia, we will stop Brilinta, and after 1 week start aspirin.      Relevant Medications   atorvastatin (LIPITOR) 40 MG tablet   aspirin EC 81 MG tablet   Other Relevant Orders   EKG 12-Lead   Presence of drug-eluting stent in right coronary artery (Chronic)    Significant CAD with a long stent in the RCA.  Now going into the third year post PCI.  With ongoing anemia, will need to wean him off of Thienopyridine.  Plan: DC Brilinta, after 1 week start aspirin 81 mg.      Hyperlipidemia associated with type 2 diabetes mellitus (HCC) (Chronic)    Remains on 80 mg atorvastatin.  Was supposed be taking half tablet, denies any feeling of vomiting.  Last lipid showed well-controlled lipids with an LDL of  48.   With him having fatigue, I think we can reduce the dose.  For now we will have been taking 80 mg every other day (Monday, Wednesday, Friday and Saturday) until her prescription completed. We will then reduce to 40 mg daily.       Relevant Medications   atorvastatin (LIPITOR) 40 MG tablet   aspirin EC 81 MG tablet   Essential hypertension (Chronic)    Blood pressures.  Well-controlled on Imdur and furosemide.  Are going to allow for little mild permissive hypertension because of his fatigue and dizziness.      Relevant Medications   atorvastatin (LIPITOR) 40 MG tablet   aspirin EC 81 MG tablet   Chronic kidney disease, stage IV (severe) (HCC) (Chronic)    Still making urine.  Baseline creatinine now up to about  3.0  Being followed by nephrology.  This is probably contributing to his anemia.  No ACE inhibitor or ARB. Not unexpectedly, he is needing occasional additional doses of furosemide. .      AV heart block (Chronic)    He has bradycardia has history of first-degree block.  Currently looks like he may be in Wenckebach block or A-V dissociation with junctional escape beats.  No beta-blocker.  With him having fatigue, will make sure he is not having significant cardiac incompetence.  We will check an 7-day monitor.      Relevant Medications  atorvastatin (LIPITOR) 40 MG tablet   aspirin EC 81 MG tablet   Other Relevant Orders   EKG 12-Lead   LONG TERM MONITOR (3-14 DAYS)      ===================================  HPI:    Clayton [name] is a 87 y.o. male with a PMH notable for MV CAD (Inferior STEMI 04/2018 -RCA PCI) who presents today for annual follow-up.  Clayton [name] was last seen on December 14, 2019: No major complaints.  Was walking with a borrowed rolling walker.  Poor balance otherwise.  Walks relatively slowly, but no exertional dyspnea or chest pain.  Edema well controlled with twice daily Lasix.  (Instructed to take second dose at lunchtime as  opposed to dinnertime).  Occasional orthopnea but no PND -> sleeps in a chair for comfort.  Exertional dyspnea with overexertion.  Recent Hospitalizations:  None  He has been getting intermittent infusions of Feraheme and blood transfusions.  Reviewed  CV studies:    The following studies were reviewed today: (if available, images/films reviewed: From Epic Chart or Care Everywhere) . None:   Interval History:   Clayton [name] is here for follow-up mostly noting fatigue.  Lack of any energy.  Family notes that he is extremely weak.  Even not wanting to talk on the phone.  Because of anemia he has been getting Feraheme and blood transfusions intermittently.  Partially related to CKD, but cannot exclude slow bleed.  Asked about stopping Brilinta.  No chest pain, pressure or dyspnea with rest or exertion despite anemia..  Out of concern for potential syncope with anemia, he has not been walking outside anymore.  Does have a much slower gait.  Has been taking an additional 20 mg of furosemide at lunchtime in addition to the 40 mg in the morning over the last couple weeks because of edema.  No PND orthopnea.  He has not had any syncope or near syncope.  No TIA or amaurosis fugax symptoms.   REVIEWED OF SYSTEMS   Review of Systems  Constitutional: Negative for malaise/fatigue and weight loss.  HENT: Positive for hearing loss (Very hard of hearing). Negative for congestion.   Respiratory: Negative for cough and shortness of breath.   Gastrointestinal: Negative for blood in stool and melena.  Genitourinary: Negative for hematuria.  Musculoskeletal: Positive for back pain and joint pain.  Neurological: Negative for dizziness and focal weakness.       Unsteady gait, poor balance  Psychiatric/Behavioral: Positive for memory loss. The patient is nervous/anxious.    I have reviewed and (if needed) personally updated the patient's problem list, medications, allergies, past medical and surgical  history, social and family history.   PAST MEDICAL HISTORY   Past Medical History:  Diagnosis Date  . Anemia   . BPH (benign prostatic hyperplasia)   . CAD (coronary artery disease)    hx of stemi  04-2018   . Chronic renal failure   . Chronic renal insufficiency   . COPD (chronic obstructive pulmonary disease) ([place])   . DDD (degenerative disc disease), lumbar   . Diabetes mellitus   . Hyperlipidemia   . Hyperparathyroidism ([place])   . Hypertension   . IDA (iron deficiency anemia)   . Neuromuscular disorder ([place])   . OSA on CPAP   . Osteomyelitis of forearm ([place])   . Osteoporosis     PAST SURGICAL HISTORY   Past Surgical History:  Procedure Laterality Date  . CORONARY STENT INTERVENTION N/A 04/20/2018   Procedure: CORONARY STENT INTERVENTION;  Surgeon: Leonie Man, MD;  Location: [facility] INVASIVE CV LAB;;;    . [place] [place] ACUTE MI REVASCULARIZATION N/A 04/20/2018   Procedure: Coronary/Graft Acute MI Revascularization;  Surgeon: Leonie Man, MD;  Location: [place] CV LAB;  Service: Cardiovascular;  Laterality: N/A; --> after initial PTCA restoring flow down the RCA, there was diffuse proximal to mid disease treated with a DES SYNERGY 3 X 38 ( 3.54mm)  . EYE SURGERY    . HOLTER MONITOR  05/2018   Sinus rhythm noted with sinus bradycardia.  Also sinus rhythm with 2-1 AV block noted.  Maximum heart rate was sinus tachycardia 123 bpm.  Rare PVCs accelerated idioventricular rhythm noted.  Intermittent Wenckebach block noted.  . I & D EXTREMITY Right 08/13/2016   Procedure: IRRIGATION AND DEBRIDEMENT RIGHT ELBOW AND HAND;  Surgeon: Milly Jakob, MD;  Location: [facility] ORS;  Service: Orthopedics;  Laterality: Right;  . LEFT HEART CATH AND CORONARY ANGIOGRAPHY N/A 04/20/2018   Procedure: LEFT HEART CATH AND CORONARY ANGIOGRAPHY;  Surgeon: Leonie Man, MD;  Location: [facility] CV LAB; mRCA 100%&80% (after 55% tapering in pRCA), mLM-ostLAD 40% w/ 85% ostCx, ost-pCx 65% @ Om1 w/  ost 85%. pLAD 60% & 50% after D1 with distal 75%, ostD1 65% & 90% after small side [name].    . TRANSTHORACIC ECHOCARDIOGRAM  04/21/2018   EF 50-55%. Posterior Wall HK. Severe RV HK, dilated IVC. Temp wire in place  . VASECTOMY    . VIDEO BRONCHOSCOPY Bilateral 08/25/2012   Procedure: VIDEO BRONCHOSCOPY WITHOUT FLUORO;  Surgeon: Brand Males, MD;  Location: [facility];  Service: Cardiopulmonary;  Laterality: Bilateral;   Cath-PCI 04/2018      Immunization History  Administered Date(s) Administered  . Influenza Split 03/18/2012  . Influenza,inj,Quad PF,6+ Mos 03/05/2013  . Influenza-Unspecified 02/15/2015, 04/01/2020  . PFIZER(Purple Top)SARS-COV-2 Vaccination 07/01/2019, 07/22/2019  . Pneumococcal Conjugate-13 09/30/2013  . Pneumococcal Polysaccharide-23 06/20/2012  . Tdap 09/25/2015  . Zoster Recombinat (Shingrix) 04/10/2017, 10/09/2017    MEDICATIONS/ALLERGIES   Current Meds  Medication Sig  . ACCU-CHEK SOFTCLIX LANCETS lancets TEST BLOOD SUGAR FOUR TIMES DAILY  . albuterol (PROVENTIL HFA;VENTOLIN HFA) 108 (90 Base) MCG/ACT inhaler Inhale 1-2 puffs into the lungs every 6 (six) hours as needed for wheezing or shortness of breath.  . Alcohol Swabs (B-D SINGLE USE SWABS REGULAR) PADS USE 7 SWABS DAILY AS DIRECTED  . aspirin EC 81 MG tablet Take 1 tablet (81 mg total) by mouth daily. Swallow whole.  Marland Kitchen atorvastatin (LIPITOR) 40 MG tablet Take 1 tablet (40 mg total) by mouth daily.  . benzonatate (TESSALON) 100 MG capsule Take by mouth 3 (three) times daily as needed for cough.  . budesonide-formoterol (SYMBICORT) 160-4.5 MCG/ACT inhaler Inhale 2 puffs into the lungs 2 (two) times daily.  . cholecalciferol (VITAMIN D) 1000 units tablet Take 2,000 Units by mouth daily.  . Continuous Blood Gluc Receiver (FREESTYLE LIBRE 14 DAY READER) DEVI 1 each by Does not apply route every 14 (fourteen) days. Use reader to monitor blood sugar continuously with freestyle libre sensor.  .  Continuous Blood Gluc Sensor (FREESTYLE LIBRE 14 DAY SENSOR) MISC APPLY EVERY 14 DAYS TO MONITOR BLOOD SUGAR  . DROPLET INSULIN SYRINGE 31G X 5/16" 0.3 ML MISC USE THREE TIMES DAILY  . finasteride (PROSCAR) 5 MG tablet TAKE 1 TABLET EVERY DAY  . furosemide (LASIX) 20 MG tablet Take 1 tablet (20 mg total) by mouth 2 (two) times daily. Take seocnd dose about 12 noon each daily  .  glucose blood test strip Use as instructed to test blood sugars 4 times daily  . insulin aspart (NOVOLOG) 100 UNIT/ML injection INJECT 7 UNITS SUBCUTANEOUS EVERY MORNING, 6 UNITS AT LUNCH, AND 9 UNITS AT SUPPER  . insulin glargine (LANTUS) 100 UNIT/ML injection Inject 0.16 mLs (16 Units total) into the skin daily.  . isosorbide mononitrate (IMDUR) 30 MG 24 hr tablet TAKE 1 TABLET BY MOUTH DAILY  . Melatonin 5 MG TABS Take 2 tablets by mouth at bedtime.   . montelukast (SINGULAIR) 10 MG tablet   . Multiple Vitamin (MULTIVITAMIN WITH MINERALS) TABS tablet Take 1 tablet by mouth daily.  Marland Kitchen omeprazole (PRILOSEC) 20 MG capsule Take 1 capsule (20 mg total) by mouth daily.  Marland Kitchen oxybutynin (DITROPAN) 5 MG tablet   . [DISCONTINUED] atorvastatin (LIPITOR) 80 MG tablet TAKE 1/2 TABLET(40 MG) BY MOUTH DAILY  . [DISCONTINUED] ticagrelor (BRILINTA) 60 MG TABS tablet TAKE 1 TABLET(60 MG) BY MOUTH TWICE DAILY    Allergies  Allergen Reactions  . Doxycycline Rash  . Levaquin [Levofloxacin In D5w]     Caused chest pain and heartburn  . Aliskiren Other (See Comments)  . Lisinopril Other (See Comments)  . Losartan Potassium Other (See Comments)  . Nitrous Oxide Other (See Comments)    Reaction:  Unknown   . Penicillins Other (See Comments)    Reaction:  Unknown Has patient had a PCN reaction causing immediate rash, facial/tongue/throat swelling, SOB or lightheadedness with hypotension: Unsure Has patient had a PCN reaction causing severe rash involving mucus membranes or skin necrosis: Unsure Has patient had a PCN reaction that  required hospitalization Unsure  Has patient had a PCN reaction occurring within the last 10 years: No If all of the above answers are "NO", then may proceed with Cephalosporin use.  . Tape Other (See Comments)    Reaction:  Tears pts skin   . Diltiazem Rash    SOCIAL HISTORY/FAMILY HISTORY   Reviewed in Epic:  Pertinent findings:  Social History   Tobacco Use  . Smoking status: Former Smoker    Packs/day: 3.00    Years: 33.00    Pack years: 99.00    Types: Cigarettes    Quit date: 06/19/1973    Years since quitting: 47.4  . Smokeless tobacco: Former Systems developer    Quit date: 09/26/1975  Vaping Use  . Vaping Use: Never used  Substance Use Topics  . Alcohol use: No  . Drug use: No   Social History   Social History Narrative   He is accompanied by his daughter who seems to be quite involved with his care.      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 He also went to the [facility] several times a week until he began to have symptoms.     OBJCTIVE -PE, EKG, labs   Wt Readings from Last 3 Encounters:  11/15/20 185 lb 9.6 oz (84.2 kg)  10/18/20 184 lb 3.2 oz (83.6 kg)  09/12/20 180 lb 12.8 oz (82 kg)    Physical Exam: BP (!) 126/52   Pulse (!) 49   Ht 6' (1.829 m)   Wt 185 lb 9.6 oz (84.2 kg)   SpO2 99%   BMI 25.17 kg/m  Physical Exam Vitals reviewed.  Constitutional:      General: He is not in acute distress.    Appearance: He is normal weight. He is ill-appearing (Chronically). He is not toxic-appearing.  Comments:  Scruffy beard, but otherwise well-groomed.  Somewhat frail, weak appearing.  HENT:     Head: Normocephalic and atraumatic.     Ears:     Comments: Very hard of hearing.  Neck:     Vascular: No carotid bruit or JVD.  Cardiovascular:     Rate and Rhythm: Normal rate and regular rhythm.     Pulses: Normal pulses.     Heart sounds: S1 normal and S2 normal. Heart sounds are distant. No murmur heard. No friction rub.  No gallop.   Pulmonary:     Effort: Pulmonary effort is normal. No respiratory distress.     Breath sounds: Normal breath sounds. No wheezing, rhonchi or rales.  Chest:     Chest wall: No tenderness.  Musculoskeletal:        General: Swelling present. Normal range of motion.     Cervical back: Normal range of motion and neck supple.     Right lower leg: Edema (1-2+) present.     Left lower leg: Edema (1+) present.  Skin:    General: Skin is warm and dry.     Coloration: Skin is pale.  Neurological:     General: No focal deficit present.     Mental Status: He is alert and oriented to person, place, and time.  Psychiatric:        Behavior: Behavior normal.        Thought Content: Thought content normal.        Judgment: Judgment normal.     Comments: Memory loss; very subdued.  Not speaking much.  Slow mentation.      Adult ECG Report  Rate: 49;  Rhythm: Difficult rhythm to assess, there are P waves that march out and apparently some conducted QRS complexes and other junctional escape beats.  There is clearly AV block which appears to be a potential Wenke block pattern.  Nonspecific ST and T wave changes.  Low voltage.;   Narrative Interpretation: The A-V dissociation/junctional escape is new from last EKG, but not unknown.  Recent Labs: Reviewed Lab Results  Component Value Date   CHOL 103 07/19/2020   HDL 43.20 07/19/2020   LDLCALC 48 07/19/2020   TRIG 60.0 07/19/2020   CHOLHDL 2 07/19/2020   Lab Results  Component Value Date   CREATININE 3.10 (HH) 08/22/2020   BUN 85 (H) 08/22/2020   NA 140 08/22/2020   K 3.7 08/22/2020   CL 105 08/22/2020   CO2 24 08/22/2020   CBC Latest Ref Rng & Units 08/22/2020 08/22/2020 07/19/2020  WBC 4.0 - 10.5 K/uL 12.8(H) - 11.3(H)  Hemoglobin 13.0 - 17.0 g/dL 8.1(L) - 8.4 Repeated and verified X2.(L)  Hematocrit 37.5 - 51.0 % 25.9(L) 25.8(L) 25.1 Repeated and verified X2.(L)  Platelets 150 - 400 K/uL 353 - 393.0    Lab Results  Component  Value Date   TSH 2.82 07/19/2020    ==================================================  COVID-19 Education: The signs and symptoms of COVID-19 were discussed with the patient and how to seek care for testing (follow up with PCP or arrange E-visit).    I spent a total of 56 minutes with the patient spent in direct patient consultation.  Additional time spent with chart review  / charting (studies, outside notes, etc): 14 min Total Time: 70 min  Current medicines are reviewed at length with the patient today.  (+/- concerns) asked about stopping Brilinta.  This visit occurred during the SARS-CoV-2 public health emergency.  Safety protocols were  in place, including screening questions prior to the visit, additional usage of staff PPE, and extensive cleaning of exam room while observing appropriate contact time as indicated for disinfecting solutions.  Notice: This dictation was prepared with Dragon dictation along with smaller phrase technology. Any transcriptional errors that result from this process are unintentional and may not be corrected upon review.  Patient Instructions / Medication Changes & Studies & Tests Ordered   Patient Instructions  Medication Instructions:   stop taking Brilinta  Now    - one week from stopping Brilinta then Start taking 81 mg enteric coated Aspirin  Daily    Change and take Atorvastatin on Monday , Wednesday , Friday, Saturday only until the current bottles empty  Then take  Atorvastatin 40 mg  Daily  ( new prescription )   *If you need a refill on your cardiac medications before your next appointment, please call your pharmacy*   Lab Work: Not needed   Testing/Procedures: Your physician has recommended that you wear a holter monitor 7 day ZIO . Holter monitors are medical devices that record the heart's electrical activity. Doctors most often use these monitors to diagnose arrhythmias. Arrhythmias are problems with the speed or rhythm of the  heartbeat. The monitor is a small, portable device. You can wear one while you do your normal daily activities. This is usually used to diagnose what is causing palpitations/syncope (passing out).     Follow-Up: At [facility], you and your health needs are our priority.  As part of our continuing mission to provide you with exceptional heart care, we have created designated Provider Care Teams.  These Care Teams include your primary Cardiologist (physician) and Advanced Practice Providers (APPs -  Physician Assistants and Nurse Practitioners) who all work together to provide you with the care you need, when you need it.     Your next appointment:   2 month(s)  The format for your next appointment:   in person or virtual  Provider:   Glenetta Hew, MD   Other Instructions  ZIO XT- Long Term Monitor Instructions   Your physician has requested you wear a ZIO patch monitor for  7   days.  This is a single patch monitor.   [employer] supplies one patch monitor per enrollment. Additional stickers are not available. Please do not apply patch if you will be having a Nuclear Stress Test, Echocardiogram, Cardiac CT, MRI, or Chest Xray during the period you would be wearing the monitor. The patch cannot be worn during these tests. You cannot remove and re-apply the ZIO XT patch monitor.  Your ZIO patch monitor will be sent Fed Ex from [employer] directly to your home address. It may take 3-5 days to receive your monitor after you have been enrolled.  Once you have received your monitor, please review the enclosed instructions. Your monitor has already been registered assigning a specific monitor serial # to you.  Billing and Patient Assistance Program Information   We have supplied [employer] with any of your insurance information on file for billing purposes. [employer] offers a sliding scale Patient Assistance Program for patients that do not have insurance, or whose insurance does not  completely cover the cost of the ZIO monitor.   You must apply for the Patient Assistance Program to qualify for this discounted rate.     To apply, please call [employer] at [phone], select option 4, then select option 2, and ask to apply for Patient Assistance Program.  [employer] will ask your household income, and how many people are in your household.  They will quote your out-of-pocket cost based on that information.  [employer] will also be able to set up a 53-month, interest-free payment plan if needed.  Applying the monitor   Shave hair from upper left chest.  Hold abrader disc by orange tab. Rub abrader in 40 strokes over the upper left chest as indicated in your monitor instructions.  Clean area with 4 enclosed alcohol pads. Let dry.  Apply patch as indicated in monitor instructions. Patch will be placed under collarbone on left side of chest with arrow pointing upward.  Rub patch adhesive wings for 2 minutes. Remove white label marked "1". Remove the white label marked "2". Rub patch adhesive wings for 2 additional minutes.  While looking in a mirror, press and release button in center of patch. A small green light will flash 3-4 times. This will be your only indicator that the monitor has been turned on. ?  Do not shower for the first 24 hours. You may shower after the first 24 hours.  Press the button if you feel a symptom. You will hear a small click. Record Date, Time and Symptom in the Patient Logbook.  When you are ready to remove the patch, follow instructions on the last 2 pages of the Patient Logbook. Stick patch monitor onto the last page of Patient Logbook.  Place Patient Logbook in the blue and white box.  Use locking tab on box and tape box closed securely.  The blue and white box has prepaid postage on it. Please place it in the mailbox as soon as possible. Your physician should have your test results approximately 7 days after the monitor has been mailed back to [facility].  Call  Sharon at [phone] if you have questions regarding your ZIO XT patch monitor. Call them immediately if you see an orange light blinking on your monitor.  If your monitor falls off in less than 4 days, contact our Monitor department at [phone]. ?If your monitor becomes loose or falls off after 4 days call [employer] at [phone] for suggestions on securing your monitor.?      Studies Ordered:   Orders Placed This Encounter  Procedures  . LONG TERM MONITOR (3-14 DAYS)  . EKG 12-Lead     Glenetta Hew, M.D., M.S. Interventional Cardiologist   Pager # [phone] Phone # [phone] [address] [place], [place] [address]   Thank you for choosing [facility] at [facility]!!

## 2020-11-15 ENCOUNTER — Encounter: Payer: Self-pay | Admitting: Cardiology

## 2020-11-15 ENCOUNTER — Ambulatory Visit: Payer: HMO | Admitting: Cardiology

## 2020-11-15 ENCOUNTER — Encounter: Payer: Self-pay | Admitting: Radiology

## 2020-11-15 ENCOUNTER — Other Ambulatory Visit (HOSPITAL_COMMUNITY): Payer: Self-pay | Admitting: *Deleted

## 2020-11-15 ENCOUNTER — Other Ambulatory Visit: Payer: Self-pay

## 2020-11-15 ENCOUNTER — Ambulatory Visit (INDEPENDENT_AMBULATORY_CARE_PROVIDER_SITE_OTHER): Payer: HMO

## 2020-11-15 VITALS — BP 126/52 | HR 49 | Ht 72.0 in | Wt 185.6 lb

## 2020-11-15 DIAGNOSIS — I25119 Atherosclerotic heart disease of native coronary artery with unspecified angina pectoris: Secondary | ICD-10-CM | POA: Diagnosis not present

## 2020-11-15 DIAGNOSIS — I10 Essential (primary) hypertension: Secondary | ICD-10-CM

## 2020-11-15 DIAGNOSIS — E785 Hyperlipidemia, unspecified: Secondary | ICD-10-CM

## 2020-11-15 DIAGNOSIS — I4430 Unspecified atrioventricular block: Secondary | ICD-10-CM | POA: Diagnosis not present

## 2020-11-15 DIAGNOSIS — Z955 Presence of coronary angioplasty implant and graft: Secondary | ICD-10-CM

## 2020-11-15 DIAGNOSIS — E1169 Type 2 diabetes mellitus with other specified complication: Secondary | ICD-10-CM

## 2020-11-15 DIAGNOSIS — E611 Iron deficiency: Secondary | ICD-10-CM

## 2020-11-15 DIAGNOSIS — N184 Chronic kidney disease, stage 4 (severe): Secondary | ICD-10-CM

## 2020-11-15 MED ORDER — ASPIRIN EC 81 MG PO TBEC: 81.0000 mg | DELAYED_RELEASE_TABLET | Freq: Every day | ORAL | 3 refills | Status: AC

## 2020-11-15 MED ORDER — ATORVASTATIN CALCIUM 40 MG PO TABS: 40.0000 mg | ORAL_TABLET | Freq: Every day | ORAL | 3 refills | Status: AC

## 2020-11-15 NOTE — Patient Instructions (Addendum)
Medication Instructions:   stop taking Brilinta  Now    - one week from stopping Brilinta then Start taking 81 mg enteric coated Aspirin  Daily    Change and take Atorvastatin on Monday , Wednesday , Friday, Saturday only until the current bottles empty  Then take  Atorvastatin 40 mg  Daily  ( new prescription )   *If you need a refill on your cardiac medications before your next appointment, please call your pharmacy*   Lab Work: Not needed   Testing/Procedures: Your physician has recommended that you wear a holter monitor 7 day ZIO . Holter monitors are medical devices that record the heart's electrical activity. Doctors most often use these monitors to diagnose arrhythmias. Arrhythmias are problems with the speed or rhythm of the heartbeat. The monitor is a small, portable device. You can wear one while you do your normal daily activities. This is usually used to diagnose what is causing palpitations/syncope (passing out).     Follow-Up: At [facility], you and your health needs are our priority.  As part of our continuing mission to provide you with exceptional heart care, we have created designated Provider Care Teams.  These Care Teams include your primary Cardiologist (physician) and Advanced Practice Providers (APPs -  Physician Assistants and Nurse Practitioners) who all work together to provide you with the care you need, when you need it.     Your next appointment:   2 month(s)  The format for your next appointment:   in person or virtual  Provider:   Glenetta Hew, MD   Other Instructions  ZIO XT- Long Term Monitor Instructions   Your physician has requested you wear a ZIO patch monitor for  7   days.  This is a single patch monitor.   [employer] supplies one patch monitor per enrollment. Additional stickers are not available. Please do not apply patch if you will be having a Nuclear Stress Test, Echocardiogram, Cardiac CT, MRI, or Chest Xray during the period you  would be wearing the monitor. The patch cannot be worn during these tests. You cannot remove and re-apply the ZIO XT patch monitor.  Your ZIO patch monitor will be sent Fed Ex from [employer] directly to your home address. It may take 3-5 days to receive your monitor after you have been enrolled.  Once you have received your monitor, please review the enclosed instructions. Your monitor has already been registered assigning a specific monitor serial # to you.  Billing and Patient Assistance Program Information   We have supplied [employer] with any of your insurance information on file for billing purposes. [employer] offers a sliding scale Patient Assistance Program for patients that do not have insurance, or whose insurance does not completely cover the cost of the ZIO monitor.   You must apply for the Patient Assistance Program to qualify for this discounted rate.     To apply, please call [employer] at [phone], select option 4, then select option 2, and ask to apply for Patient Assistance Program.  Theodore Demark will ask your household income, and how many people are in your household.  They will quote your out-of-pocket cost based on that information.  [employer] will also be able to set up a 69-month, interest-free payment plan if needed.  Applying the monitor   Shave hair from upper left chest.  Hold abrader disc by orange tab. Rub abrader in 40 strokes over the upper left chest as indicated in your monitor instructions.  Clean  area with 4 enclosed alcohol pads. Let dry.  Apply patch as indicated in monitor instructions. Patch will be placed under collarbone on left side of chest with arrow pointing upward.  Rub patch adhesive wings for 2 minutes. Remove white label marked "1". Remove the white label marked "2". Rub patch adhesive wings for 2 additional minutes.  While looking in a mirror, press and release button in center of patch. A small green light will flash 3-4 times. This will be your  only indicator that the monitor has been turned on. ?  Do not shower for the first 24 hours. You may shower after the first 24 hours.  Press the button if you feel a symptom. You will hear a small click. Record Date, Time and Symptom in the Patient Logbook.  When you are ready to remove the patch, follow instructions on the last 2 pages of the Patient Logbook. Stick patch monitor onto the last page of Patient Logbook.  Place Patient Logbook in the blue and white box.  Use locking tab on box and tape box closed securely.  The blue and white box has prepaid postage on it. Please place it in the mailbox as soon as possible. Your physician should have your test results approximately 7 days after the monitor has been mailed back to [facility].  Call [place] at [phone] if you have questions regarding your ZIO XT patch monitor. Call them immediately if you see an orange light blinking on your monitor.  If your monitor falls off in less than 4 days, contact our Monitor department at [phone]. ?If your monitor becomes loose or falls off after 4 days call [employer] at [phone] for suggestions on securing your monitor.?

## 2020-11-15 NOTE — Assessment & Plan Note (Signed)
Still making urine.  Baseline creatinine now up to about  3.0  Being followed by nephrology.  This is probably contributing to his anemia.  No ACE inhibitor or ARB. Not unexpectedly, he is needing occasional additional doses of furosemide. Clayton [name]

## 2020-11-15 NOTE — Assessment & Plan Note (Signed)
Iron deficiency anemia with ongoing infusions of Feraheme and blood transfusions.  To avoid any bleeding issues, we will have him hold Eliquis for a week, then fully stopped.  We will restart 81 mg aspirin.

## 2020-11-15 NOTE — Assessment & Plan Note (Signed)
Blood pressures.  Well-controlled on Imdur and furosemide.  Are going to allow for little mild permissive hypertension because of his fatigue and dizziness.

## 2020-11-15 NOTE — Assessment & Plan Note (Signed)
He has bradycardia has history of first-degree block.  Currently looks like he may be in Wenckebach block or A-V dissociation with junctional escape beats.  No beta-blocker.  With him having fatigue, will make sure he is not having significant cardiac incompetence.  We will check an 7-day monitor.

## 2020-11-15 NOTE — Assessment & Plan Note (Signed)
Significant CAD with a long stent in the RCA.  Now going into the third year post PCI.  With ongoing anemia, will need to wean him off of Thienopyridine.  Plan: DC Brilinta, after 1 week start aspirin 81 mg.

## 2020-11-15 NOTE — Assessment & Plan Note (Signed)
Remains on 80 mg atorvastatin.  Was supposed be taking half tablet, denies any feeling of vomiting.  Last lipid showed well-controlled lipids with an LDL of 48.   With him having fatigue, I think we can reduce the dose.  For now we will have been taking 80 mg every other day (Monday, Wednesday, Friday and Saturday) until her prescription completed. We will then reduce to 40 mg daily.

## 2020-11-15 NOTE — Progress Notes (Signed)
Enrolled patient for a 7 day Zio XT monitor to be mailed to patients home.  

## 2020-11-15 NOTE — Assessment & Plan Note (Signed)
Doing remarkably well.  He has significant disease other than the RCA there is intervened on.  Moderate LAD disease and significant ostial circumflex disease being treated medically.  No active angina despite being quite anemic.  No longer walking 3/4 mile daily because of fatigue, but this  Not able to tolerate ARB or beta-blocker because of bradycardia and hypotension. Imdur and statin.  On maintenance dose Brilinta--with his anemia, we will stop Brilinta, and after 1 week start aspirin.

## 2020-11-16 ENCOUNTER — Encounter (HOSPITAL_COMMUNITY)
Admission: RE | Admit: 2020-11-16 | Discharge: 2020-11-16 | Disposition: A | Discharge status: Home / Self Care | Payer: HMO | Source: Ambulatory Visit | Attending: Nephrology | Admitting: Nephrology

## 2020-11-16 ENCOUNTER — Encounter (HOSPITAL_COMMUNITY): Payer: HMO

## 2020-11-16 DIAGNOSIS — D631 Anemia in chronic kidney disease: Secondary | ICD-10-CM | POA: Diagnosis not present

## 2020-11-16 DIAGNOSIS — N189 Chronic kidney disease, unspecified: Secondary | ICD-10-CM | POA: Diagnosis present

## 2020-11-16 DIAGNOSIS — I441 Atrioventricular block, second degree: Secondary | ICD-10-CM

## 2020-11-16 HISTORY — DX: Atrioventricular block, second degree: I44.1

## 2020-11-16 LAB — PREPARE RBC (CROSSMATCH)

## 2020-11-16 MED ORDER — SODIUM CHLORIDE 0.9 % IV SOLN
510.0000 mg | INTRAVENOUS | Status: DC
  Administered 2020-11-16: 510 mg via INTRAVENOUS
  Filled 2020-11-16: qty 510

## 2020-11-16 MED ORDER — SODIUM CHLORIDE 0.9% IV SOLUTION: Freq: Once | INTRAVENOUS | Status: DC

## 2020-11-17 ENCOUNTER — Telehealth: Payer: Self-pay | Admitting: Hematology and Oncology

## 2020-11-17 LAB — BPAM RBC
Blood Product Expiration Date: 202206232359
ISSUE DATE / TIME: 202206011050
Unit Type and Rh: 7300

## 2020-11-17 LAB — TYPE AND SCREEN
ABO/RH(D): B POS
Antibody Screen: NEGATIVE
Unit division: 0

## 2020-11-17 NOTE — Telephone Encounter (Signed)
R/s appt per 6/2 sch msg. Pt's daughter is aware.

## 2020-11-18 DIAGNOSIS — I4430 Unspecified atrioventricular block: Secondary | ICD-10-CM

## 2020-11-22 ENCOUNTER — Inpatient Hospital Stay: Payer: HMO | Attending: Hematology and Oncology | Admitting: Hematology and Oncology

## 2020-11-22 ENCOUNTER — Encounter: Payer: Self-pay | Admitting: Hematology and Oncology

## 2020-11-22 ENCOUNTER — Inpatient Hospital Stay: Payer: HMO

## 2020-11-22 ENCOUNTER — Other Ambulatory Visit: Payer: Self-pay

## 2020-11-22 VITALS — BP 137/49 | HR 79 | Temp 97.5°F | Resp 17 | Wt 187.0 lb

## 2020-11-22 DIAGNOSIS — D649 Anemia, unspecified: Secondary | ICD-10-CM

## 2020-11-22 DIAGNOSIS — D509 Iron deficiency anemia, unspecified: Secondary | ICD-10-CM | POA: Insufficient documentation

## 2020-11-22 DIAGNOSIS — J449 Chronic obstructive pulmonary disease, unspecified: Secondary | ICD-10-CM | POA: Insufficient documentation

## 2020-11-22 DIAGNOSIS — I2510 Atherosclerotic heart disease of native coronary artery without angina pectoris: Secondary | ICD-10-CM | POA: Diagnosis not present

## 2020-11-22 DIAGNOSIS — E785 Hyperlipidemia, unspecified: Secondary | ICD-10-CM | POA: Insufficient documentation

## 2020-11-22 DIAGNOSIS — N400 Enlarged prostate without lower urinary tract symptoms: Secondary | ICD-10-CM | POA: Insufficient documentation

## 2020-11-22 DIAGNOSIS — Z87891 Personal history of nicotine dependence: Secondary | ICD-10-CM | POA: Diagnosis not present

## 2020-11-22 DIAGNOSIS — G4730 Sleep apnea, unspecified: Secondary | ICD-10-CM | POA: Insufficient documentation

## 2020-11-22 DIAGNOSIS — I129 Hypertensive chronic kidney disease with stage 1 through stage 4 chronic kidney disease, or unspecified chronic kidney disease: Secondary | ICD-10-CM | POA: Insufficient documentation

## 2020-11-22 DIAGNOSIS — M810 Age-related osteoporosis without current pathological fracture: Secondary | ICD-10-CM | POA: Insufficient documentation

## 2020-11-22 DIAGNOSIS — E119 Type 2 diabetes mellitus without complications: Secondary | ICD-10-CM | POA: Insufficient documentation

## 2020-11-22 DIAGNOSIS — E611 Iron deficiency: Secondary | ICD-10-CM

## 2020-11-22 DIAGNOSIS — N189 Chronic kidney disease, unspecified: Secondary | ICD-10-CM | POA: Diagnosis not present

## 2020-11-22 LAB — CBC WITH DIFFERENTIAL/PLATELET
Abs Immature Granulocytes: 0.14 10*3/uL — ABNORMAL HIGH (ref 0.00–0.07)
Basophils Absolute: 0.1 10*3/uL (ref 0.0–0.1)
Basophils Relative: 1 %
Eosinophils Absolute: 0.5 10*3/uL (ref 0.0–0.5)
Eosinophils Relative: 4 %
HCT: 27.2 % — ABNORMAL LOW (ref 39.0–52.0)
Hemoglobin: 8.5 g/dL — ABNORMAL LOW (ref 13.0–17.0)
Immature Granulocytes: 1 %
Lymphocytes Relative: 7 %
Lymphs Abs: 1 10*3/uL (ref 0.7–4.0)
MCH: 30.8 pg (ref 26.0–34.0)
MCHC: 31.3 g/dL (ref 30.0–36.0)
MCV: 98.6 fL (ref 80.0–100.0)
Monocytes Absolute: 1.7 10*3/uL — ABNORMAL HIGH (ref 0.1–1.0)
Monocytes Relative: 12 %
Neutro Abs: 10.1 10*3/uL — ABNORMAL HIGH (ref 1.7–7.7)
Neutrophils Relative %: 75 %
Platelets: 310 10*3/uL (ref 150–400)
RBC: 2.76 MIL/uL — ABNORMAL LOW (ref 4.22–5.81)
RDW: 17.2 % — ABNORMAL HIGH (ref 11.5–15.5)
WBC: 13.4 10*3/uL — ABNORMAL HIGH (ref 4.0–10.5)
nRBC: 0 % (ref 0.0–0.2)

## 2020-11-22 LAB — TYPE AND SCREEN
ABO/RH(D): B POS
Antibody Screen: NEGATIVE

## 2020-11-22 LAB — IRON AND TIBC
Iron: 56 ug/dL (ref 42–163)
Saturation Ratios: 19 % — ABNORMAL LOW (ref 20–55)
TIBC: 293 ug/dL (ref 202–409)
UIBC: 236 ug/dL (ref 117–376)

## 2020-11-22 LAB — FERRITIN: Ferritin: 213 ng/mL (ref 24–336)

## 2020-11-22 NOTE — Progress Notes (Signed)
Clayton [name]  Patient Care Team: Lujean Amel, MD as PCP - General (Family Medicine) Leonie Man, MD as PCP - Cardiology (Cardiology) Elayne Snare, MD as Attending Physician (Endocrinology)  CHIEF COMPLAINTS/PURPOSE OF CONSULTATION:   Anemia  ASSESSMENT & PLAN:   This is a very pleasant 87 year old male patient who was seen previously for management of anemia who returns here for follow-up.  Since his last visit, he has needed multiple iron and blood transfusions. His most recent iron infusion was last week. He continues to feel reasonably well, according to daughter, he gets tired easily, spends most of the time sitting.  No concern for major blood loss but they are again reluctant to proceed with a colonoscopy or bone marrow biopsy.  Their hope was to try and get him scheduled for periodic iron infusions or as needed blood transfusions to maintain some quality of life. We have agreed on proceeding with labs today and making a plan for standing orders for labs and iron infusions and as needed blood transfusions.  We have discussed that if he needs very frequent blood transfusions, we have may have to sit back and discuss goals of care which they are agreeable with.  Return to clinic in about 2 weeks. Thank you for consulting [place] in the care of this patient.  Please not hesitate to contact us with any additional questions or concerns.  HISTORY OF PRESENTING ILLNESS:  Clayton [name] 87 y.o. male is here because of ongoing anemia.  Mr Limbaugh is a 87 yr old male patient with PMH significant for BPH, CKD, COPD, DM, HTN referred to hematology for evaluation of anemia.     Interval History  Patient is here for a follow-up with his daughter.  Since his last visit, he had 6 doses of IV iron and 2 blood transfusions.  He has been pretty much needing iron or blood every 2 weeks to every month.  He has been following up with Dr. Joelyn Oms, his nephrologist in the  interim. He has not really started on the erythropoietin shots at this time.  His daughter still says that he would not be able to tolerate a colonoscopy or a bone marrow aspiration and biopsy and they do not want aggressive measures except for as needed blood or iron transfusion. He denies any new complaints, according to daughter he has grown weaker over the past few years, still continues to eat well, gained some weight, denies any overt blood in stool. Rest of the pertinent 10 point ROS reviewed and negative.  MEDICAL HISTORY:  Past Medical History:  Diagnosis Date  . Anemia   . BPH (benign prostatic hyperplasia)   . CAD (coronary artery disease)    hx of stemi  04-2018   . Chronic renal failure   . Chronic renal insufficiency   . COPD (chronic obstructive pulmonary disease) ([place])   . DDD (degenerative disc disease), lumbar   . Diabetes mellitus   . Hyperlipidemia   . Hyperparathyroidism ([place])   . Hypertension   . IDA (iron deficiency anemia)   . Neuromuscular disorder ([place])   . OSA on CPAP   . Osteomyelitis of forearm ([place])   . Osteoporosis     SURGICAL HISTORY: Past Surgical History:  Procedure Laterality Date  . CORONARY STENT INTERVENTION N/A 04/20/2018   Procedure: CORONARY STENT INTERVENTION;  Surgeon: Leonie Man, MD;  Location: [facility] INVASIVE CV LAB;;;    . [place] [place] ACUTE MI REVASCULARIZATION N/A 04/20/2018  Procedure: Coronary/Graft Acute MI Revascularization;  Surgeon: Leonie Man, MD;  Location: [place] CV LAB;  Service: Cardiovascular;  Laterality: N/A; --> after initial PTCA restoring flow down the RCA, there was diffuse proximal to mid disease treated with a DES SYNERGY 3 X 38 ( 3.37m)  . EYE SURGERY    . HOLTER MONITOR  05/2018   Sinus rhythm noted with sinus bradycardia.  Also sinus rhythm with 2-1 AV block noted.  Maximum heart rate was sinus tachycardia 123 bpm.  Rare PVCs accelerated idioventricular rhythm noted.  Intermittent Wenckebach  block noted.  . I & D EXTREMITY Right 08/13/2016   Procedure: IRRIGATION AND DEBRIDEMENT RIGHT ELBOW AND HAND;  Surgeon: DMilly Jakob MD;  Location: [facility] ORS;  Service: Orthopedics;  Laterality: Right;  . LEFT HEART CATH AND CORONARY ANGIOGRAPHY N/A 04/20/2018   Procedure: LEFT HEART CATH AND CORONARY ANGIOGRAPHY;  Surgeon: HLeonie Man MD;  [facility]; mRCA 100%&80% (after 55% tapering in pRCA), mLM-ostLAD 40% w/ 85% ostCx, ost-pCx 65% @ Om1 w/ ost 85%. pLAD 60% & 50% after D1 with distal 75%, ostD1 65% & 90% after small side branch.    . TRANSTHORACIC ECHOCARDIOGRAM  04/21/2018   EF 50-55%. Posterior Wall HK. Severe RV HK, dilated IVC. Temp wire in place  . VASECTOMY    . VIDEO BRONCHOSCOPY Bilateral 08/25/2012   Procedure: VIDEO BRONCHOSCOPY WITHOUT FLUORO;  Surgeon: MBrand Males MD;  [place]  Service: Cardiopulmonary;  Laterality: Bilateral;    SOCIAL HISTORY: Social History   Socioeconomic History  . Marital status: Widowed    Spouse name: Not on file  . Number of children: 4  . Years of education: 16  . Highest education level: Bachelor's degree (e.g., BA, AB, BS)  Occupational History  . Occupation: retired  Tobacco Use  . Smoking status: Former Smoker    Packs/day: 3.00    Years: 33.00    Pack years: 99.00    Types: Cigarettes    Quit date: 06/19/1973    Years since quitting: 47.4  . Smokeless tobacco: Former [employer] developer   Quit date: 09/26/1975  Vaping Use  . Vaping Use: Never used  Substance and Sexual Activity  . Alcohol use: No  . Drug use: No  . Sexual activity: Not Currently  Other Topics Concern  . Not on file  Social History Narrative   He is accompanied by his daughter who seems to be quite involved with his care.      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 [facility] began to have symptoms.    Social Determinants of Health   Financial  Resource Strain: Not on file  Food Insecurity: Not on file  Transportation Needs: Not on file  Physical Activity: Not on file  Stress: Not on file  Social Connections: Not on file  Intimate Partner Violence: Not on file    FAMILY HISTORY: Family History  Problem Relation Age of Onset  . Diabetes Mellitus II Other   . Hypertension Other   . Other Father        complications from strep  . CVA Father   . Cancer Sister   . Other Brother        hip replacement complications     ALLERGIES:  is allergic to doxycycline, levaquin [levofloxacin in d5w], aliskiren, lisinopril, losartan potassium, nitrous oxide, penicillins, tape, and  diltiazem.  MEDICATIONS:  Current Outpatient Medications  Medication Sig Dispense Refill  . ACCU-CHEK SOFTCLIX LANCETS lancets TEST BLOOD SUGAR FOUR TIMES DAILY 400 each 1  . albuterol (PROVENTIL HFA;VENTOLIN HFA) 108 (90 Base) MCG/ACT inhaler Inhale 1-2 puffs into the lungs every 6 (six) hours as needed for wheezing or shortness of breath.    . Alcohol Swabs (B-D SINGLE USE SWABS REGULAR) PADS USE 7 SWABS DAILY AS DIRECTED 600 each 0  . aspirin EC 81 MG tablet Take 1 tablet (81 mg total) by mouth daily. Swallow whole. 90 tablet 3  . atorvastatin (LIPITOR) 40 MG tablet Take 1 tablet (40 mg total) by mouth daily. 90 tablet 3  . benzonatate (TESSALON) 100 MG capsule Take by mouth 3 (three) times daily as needed for cough.    . budesonide-formoterol (SYMBICORT) 160-4.5 MCG/ACT inhaler Inhale 2 puffs into the lungs 2 (two) times daily. 10.2 g 5  . cholecalciferol (VITAMIN D) 1000 units tablet Take 2,000 Units by mouth daily.    . Continuous Blood Gluc Receiver (FREESTYLE LIBRE 14 DAY READER) DEVI 1 each by Does not apply route every 14 (fourteen) days. Use reader to monitor blood sugar continuously with freestyle libre sensor. 1 each 0  . Continuous Blood Gluc Sensor (FREESTYLE LIBRE 14 DAY SENSOR) MISC APPLY EVERY 14 DAYS TO MONITOR BLOOD SUGAR 6 each 3  . DROPLET  INSULIN SYRINGE 31G X 5/16" 0.3 ML MISC USE THREE TIMES DAILY 300 each 2  . finasteride (PROSCAR) 5 MG tablet TAKE 1 TABLET EVERY DAY 90 tablet 1  . furosemide (LASIX) 20 MG tablet Take 1 tablet (20 mg total) by mouth 2 (two) times daily. Take seocnd dose about 12 noon each daily 180 tablet 3  . glucose blood test strip Use as instructed to test blood sugars 4 times daily 150 each 12  . insulin aspart (NOVOLOG) 100 UNIT/ML injection INJECT 7 UNITS SUBCUTANEOUS EVERY MORNING, 6 UNITS AT LUNCH, AND 9 UNITS AT SUPPER 30 mL 3  . insulin glargine (LANTUS) 100 UNIT/ML injection Inject 0.16 mLs (16 Units total) into the skin daily. 32 mL 2  . isosorbide mononitrate (IMDUR) 30 MG 24 hr tablet TAKE 1 TABLET BY MOUTH DAILY 30 tablet 8  . Melatonin 5 MG TABS Take 2 tablets by mouth at bedtime.     . montelukast (SINGULAIR) 10 MG tablet     . Multiple Vitamin (MULTIVITAMIN WITH MINERALS) TABS tablet Take 1 tablet by mouth daily.    . nitroGLYCERIN (NITROSTAT) 0.4 MG SL tablet Place 1 tablet (0.4 mg total) under the tongue every 5 (five) minutes as needed for chest pain. 25 tablet 6  . omeprazole (PRILOSEC) 20 MG capsule Take 1 capsule (20 mg total) by mouth daily. 30 capsule 3  . oxybutynin (DITROPAN) 5 MG tablet      No current facility-administered medications for this visit.     PHYSICAL EXAMINATION:  ECOG PERFORMANCE STATUS: 0 - Asymptomatic  Vitals:   11/22/20 1059  BP: (!) 137/49  Pulse: 79  Resp: 17  Temp: (!) 97.5 F (36.4 C)  SpO2: 100%   Filed Weights   11/22/20 1059  Weight: 187 lb (84.8 kg)     Physical Exam Constitutional:      General: He is not in acute distress.    Appearance: Normal appearance.  HENT:     Head: Normocephalic and atraumatic.  Cardiovascular:     Rate and Rhythm: Normal rate and regular rhythm.     Pulses: Normal  pulses.     Heart sounds: Normal heart sounds.  Pulmonary:     Effort: Pulmonary effort is normal.     Breath sounds: Normal breath  sounds.  Abdominal:     General: Abdomen is flat. Bowel sounds are normal.     Palpations: Abdomen is soft.  Musculoskeletal:        General: Normal range of motion.     Right lower leg: Edema present.     Left lower leg: Edema present.  Skin:    General: Skin is warm and dry.  Neurological:     General: No focal deficit present.     Mental Status: He is alert.  Psychiatric:        Mood and Affect: Mood normal.    LABORATORY DATA:  I have reviewed the data as listed  Lab Results  Component Value Date   WBC 12.8 (H) 08/22/2020   HGB 8.1 (L) 08/22/2020   HCT 25.9 (L) 08/22/2020   MCV 100.4 (H) 08/22/2020   PLT 353 08/22/2020     Chemistry      Component Value Date/Time   NA 140 08/22/2020 1200   K 3.7 08/22/2020 1200   CL 105 08/22/2020 1200   CO2 24 08/22/2020 1200   BUN 85 (H) 08/22/2020 1200   CREATININE 3.10 (HH) 08/22/2020 1200   GLU 146 03/09/2020 0000      Component Value Date/Time   CALCIUM 8.8 (L) 08/22/2020 1200   ALKPHOS 121 08/22/2020 1200   AST 25 08/22/2020 1200   ALT 22 08/22/2020 1200   BILITOT 0.2 (L) 08/22/2020 1200      RADIOGRAPHIC STUDIES: I have personally reviewed the radiological images as listed and agreed with the findings in the report. No results found.   We reviewed all labs from last visit.  All questions were answered. The patient knows to call the clinic with any problems, questions or concerns.     Benay Pike, MD 11/22/2020 11:01 AM

## 2020-11-28 ENCOUNTER — Ambulatory Visit: Payer: HMO | Admitting: Hematology and Oncology

## 2020-12-05 NOTE — Progress Notes (Signed)
[place] [facility] CONSULT NOTE  Patient Care Team: Clayton Amel, MD as PCP - General (Family Medicine) Clayton Man, MD as PCP - Cardiology (Cardiology) Clayton Snare, MD as Attending Physician (Endocrinology)  CHIEF COMPLAINTS/PURPOSE OF CONSULTATION:   Anemia  ASSESSMENT & PLAN:   This is a very pleasant 87 year old male patient who was seen previously for management of anemia who returns here for follow-up.  He is here for FU with his daughter. He feels ok, denies any unusual fatigue. Appetite is ok, weight is stable PE today, 1+ BLE edema, appears tired and frail, bibasilar atelectasis Repeat labs today Reviewed labs from 2 weeks ago. No iron def, Hb 8.5, no indication for transfusion. Will plan to transfuse if Hb less than or equal to 7, last blood transfusion 6/2  Return to clinic in about 2/3 weeks. Thank you for consulting [place] in the care of this patient.  Please not hesitate to contact us with any additional questions or concerns.  HISTORY OF PRESENTING ILLNESS:  Clayton [name] 87 y.o. male is here because of ongoing anemia.  Clayton [name] is a 87 yr old male patient with PMH significant for BPH, CKD, COPD, DM, HTN referred to hematology for evaluation of anemia.    Interval History  Patient is here for a follow-up with his daughter.   He denies any unusual fatigue Eating about the same, no weight loss No blood loss in his stool According to daughter he is deconditioned compared to 3 months ago, but manages at home on his own. Rest of the pertinent 10 point ROS reviewed and negative.  MEDICAL HISTORY:  Past Medical History:  Diagnosis Date   Anemia    BPH (benign prostatic hyperplasia)    CAD (coronary artery disease)    hx of stemi  04-2018    Chronic renal failure    Chronic renal insufficiency    COPD (chronic obstructive pulmonary disease) (HCC)    DDD (degenerative disc disease), lumbar    Diabetes mellitus    Hyperlipidemia     Hyperparathyroidism ([place])    Hypertension    IDA (iron deficiency anemia)    Neuromuscular disorder (HCC)    OSA on CPAP    Osteomyelitis of forearm ([place])    Osteoporosis     SURGICAL HISTORY: Past Surgical History:  Procedure Laterality Date   CORONARY STENT INTERVENTION N/A 04/20/2018   Procedure: CORONARY STENT INTERVENTION;  Surgeon: Clayton Man, MD;  Location: [facility] INVASIVE CV LAB;;;     CORONARY/GRAFT ACUTE MI REVASCULARIZATION N/A 04/20/2018   Procedure: Coronary/Graft Acute MI Revascularization;  Surgeon: Clayton Man, MD;  Location: [facility] CV LAB;  Service: Cardiovascular;  Laterality: N/A; --> after initial PTCA restoring flow down the RCA, there was diffuse proximal to mid disease treated with a DES SYNERGY 3 X 38 ( 3.62mm)   EYE SURGERY     HOLTER MONITOR  05/2018   Sinus rhythm noted with sinus bradycardia.  Also sinus rhythm with 2-1 AV block noted.  Maximum heart rate was sinus tachycardia 123 bpm.  Rare PVCs accelerated idioventricular rhythm noted.  Intermittent Wenckebach block noted.   I & D EXTREMITY Right 08/13/2016   Procedure: IRRIGATION AND DEBRIDEMENT RIGHT ELBOW AND HAND;  Surgeon: Milly Jakob, MD;  Location: [facility];  Service: Orthopedics;  Laterality: Right;   LEFT HEART CATH AND CORONARY ANGIOGRAPHY N/A 04/20/2018   Procedure: LEFT HEART CATH AND CORONARY ANGIOGRAPHY;  Surgeon: Clayton Man, MD;  Location: [facility]  CV LAB; mRCA 100%&80% (after 55% tapering in pRCA), mLM-ostLAD 40% w/ 85% ostCx, ost-pCx 65% @ Om1 w/ ost 85%. pLAD 60% & 50% after D1 with distal 75%, ostD1 65% & 90% after small side branch.     TRANSTHORACIC ECHOCARDIOGRAM  04/21/2018   EF 50-55%. Posterior Wall HK. Severe RV HK, dilated IVC. Temp wire in place   VASECTOMY     VIDEO BRONCHOSCOPY Bilateral 08/25/2012   Procedure: VIDEO BRONCHOSCOPY WITHOUT FLUORO;  Surgeon: Brand Males, MD;  Location: [facility];  Service: Cardiopulmonary;  Laterality: Bilateral;    SOCIAL  HISTORY: Social History   Socioeconomic History   Marital status: Widowed    Spouse name: Not on file   Number of children: 4   Years of education: 16   Highest education level: Bachelor's degree (e.g., BA, AB, BS)  Occupational History   Occupation: retired  Tobacco Use   Smoking status: Former    Packs/day: 3.00    Years: 33.00    Pack years: 99.00    Types: Cigarettes    Quit date: 06/19/1973    Years since quitting: 47.4   Smokeless tobacco: Former    Quit date: 09/26/1975  Vaping Use   Vaping Use: Never used  Substance and Sexual Activity   Alcohol use: No   Drug use: No   Sexual activity: Not Currently  Other Topics Concern   Not on file  Social History Narrative   He is accompanied by his daughter who seems to be quite involved with his care.      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 He also went to the [facility] several times a week until he began to have symptoms.    Social Determinants of Health   Financial Resource Strain: Not on file  Food Insecurity: Not on file  Transportation Needs: Not on file  Physical Activity: Not on file  Stress: Not on file  Social Connections: Not on file  Intimate Partner Violence: Not on file    FAMILY HISTORY: Family History  Problem Relation Age of Onset   Diabetes Mellitus II Other    Hypertension Other    Other Father        complications from strep   CVA Father    Cancer Sister    Other Brother        hip replacement complications     ALLERGIES:  is allergic to doxycycline, levaquin [levofloxacin in d5w], aliskiren, lisinopril, losartan potassium, nitrous oxide, penicillins, tape, and diltiazem.  MEDICATIONS:  Current Outpatient Medications  Medication Sig Dispense Refill   ACCU-CHEK SOFTCLIX LANCETS lancets TEST BLOOD SUGAR FOUR TIMES DAILY 400 each 1   albuterol (PROVENTIL HFA;VENTOLIN HFA) 108 (90 Base) MCG/ACT inhaler Inhale 1-2 puffs into the lungs every 6 (six) hours  as needed for wheezing or shortness of breath.     Alcohol Swabs (B-D SINGLE USE SWABS REGULAR) PADS USE 7 SWABS DAILY AS DIRECTED 600 each 0   aspirin EC 81 MG tablet Take 1 tablet (81 mg total) by mouth daily. Swallow whole. 90 tablet 3   atorvastatin (LIPITOR) 40 MG tablet Take 1 tablet (40 mg total) by mouth daily. 90 tablet 3   benzonatate (TESSALON) 100 MG capsule Take by mouth 3 (three) times daily as needed for cough.     budesonide-formoterol (SYMBICORT) 160-4.5 MCG/ACT inhaler Inhale 2 puffs into the lungs 2 (two) times daily. 10.2 g 5   cholecalciferol (VITAMIN D) 1000  units tablet Take 2,000 Units by mouth daily.     Continuous Blood Gluc Receiver (FREESTYLE LIBRE 14 DAY READER) DEVI 1 each by Does not apply route every 14 (fourteen) days. Use reader to monitor blood sugar continuously with freestyle libre sensor. 1 each 0   Continuous Blood Gluc Sensor (FREESTYLE LIBRE 14 DAY SENSOR) MISC APPLY EVERY 14 DAYS TO MONITOR BLOOD SUGAR 6 each 3   DROPLET INSULIN SYRINGE 31G X 5/16" 0.3 ML MISC USE THREE TIMES DAILY 300 each 2   finasteride (PROSCAR) 5 MG tablet TAKE 1 TABLET EVERY DAY 90 tablet 1   furosemide (LASIX) 20 MG tablet Take 1 tablet (20 mg total) by mouth 2 (two) times daily. Take seocnd dose about 12 noon each daily 180 tablet 3   glucose blood test strip Use as instructed to test blood sugars 4 times daily 150 each 12   insulin aspart (NOVOLOG) 100 UNIT/ML injection INJECT 7 UNITS SUBCUTANEOUS EVERY MORNING, 6 UNITS AT LUNCH, AND 9 UNITS AT SUPPER 30 mL 3   insulin glargine (LANTUS) 100 UNIT/ML injection Inject 0.16 mLs (16 Units total) into the skin daily. 32 mL 2   isosorbide mononitrate (IMDUR) 30 MG 24 hr tablet TAKE 1 TABLET BY MOUTH DAILY 30 tablet 8   Melatonin 5 MG TABS Take 2 tablets by mouth at bedtime.      montelukast (SINGULAIR) 10 MG tablet      Multiple Vitamin (MULTIVITAMIN WITH MINERALS) TABS tablet Take 1 tablet by mouth daily.     nitroGLYCERIN (NITROSTAT)  0.4 MG SL tablet Place 1 tablet (0.4 mg total) under the tongue every 5 (five) minutes as needed for chest pain. 25 tablet 6   omeprazole (PRILOSEC) 20 MG capsule Take 1 capsule (20 mg total) by mouth daily. 30 capsule 3   oxybutynin (DITROPAN) 5 MG tablet      No current facility-administered medications for this visit.     PHYSICAL EXAMINATION:  ECOG PERFORMANCE STATUS: 0 - Asymptomatic  Vitals:   12/06/20 0849  BP: (!) 129/47  Pulse: 79  Resp: 18  Temp: 97.8 F (36.6 C)  SpO2: 100%   Filed Weights   12/06/20 0849  Weight: 186 lb 11.2 oz (84.7 kg)     Physical Exam Constitutional:      General: He is not in acute distress.    Appearance: Normal appearance.  HENT:     Head: Normocephalic and atraumatic.  Cardiovascular:     Rate and Rhythm: Normal rate and regular rhythm.     Pulses: Normal pulses.     Heart sounds: Normal heart sounds.  Pulmonary:     Effort: Pulmonary effort is normal.     Breath sounds: Rales (bibasilar) present.  Abdominal:     General: Abdomen is flat. Bowel sounds are normal.     Palpations: Abdomen is soft.  Musculoskeletal:        General: Normal range of motion.     Right lower leg: Edema present.     Left lower leg: Edema present.  Skin:    General: Skin is warm and dry.  Neurological:     General: No focal deficit present.     Mental Status: He is alert.  Psychiatric:        Mood and Affect: Mood normal.   LABORATORY DATA:  I have reviewed the data as listed  Lab Results  Component Value Date   WBC 13.4 (H) 11/22/2020   HGB 8.5 (L) 11/22/2020  HCT 27.2 (L) 11/22/2020   MCV 98.6 11/22/2020   PLT 310 11/22/2020     Chemistry      Component Value Date/Time   NA 140 08/22/2020 1200   K 3.7 08/22/2020 1200   CL 105 08/22/2020 1200   CO2 24 08/22/2020 1200   BUN 85 (H) 08/22/2020 1200   CREATININE 3.10 (HH) 08/22/2020 1200   GLU 146 03/09/2020 0000      Component Value Date/Time   CALCIUM 8.8 (L) 08/22/2020 1200    ALKPHOS 121 08/22/2020 1200   AST 25 08/22/2020 1200   ALT 22 08/22/2020 1200   BILITOT 0.2 (L) 08/22/2020 1200      RADIOGRAPHIC STUDIES:  I have personally reviewed the radiological images as listed and agreed with the findings in the report.  No results found.   We reviewed all labs from last visit.  All questions were answered. The patient knows to call the clinic with any problems, questions or concerns.     Benay Pike, MD 12/06/2020 9:24 AM

## 2020-12-06 ENCOUNTER — Inpatient Hospital Stay: Payer: HMO

## 2020-12-06 ENCOUNTER — Other Ambulatory Visit: Payer: Self-pay

## 2020-12-06 ENCOUNTER — Encounter: Payer: Self-pay | Admitting: Hematology and Oncology

## 2020-12-06 ENCOUNTER — Inpatient Hospital Stay (HOSPITAL_BASED_OUTPATIENT_CLINIC_OR_DEPARTMENT_OTHER): Payer: HMO | Admitting: Hematology and Oncology

## 2020-12-06 VITALS — BP 129/47 | HR 79 | Temp 97.8°F | Resp 18 | Ht 72.0 in | Wt 186.7 lb

## 2020-12-06 DIAGNOSIS — D631 Anemia in chronic kidney disease: Secondary | ICD-10-CM

## 2020-12-06 DIAGNOSIS — D649 Anemia, unspecified: Secondary | ICD-10-CM

## 2020-12-06 DIAGNOSIS — E611 Iron deficiency: Secondary | ICD-10-CM

## 2020-12-06 DIAGNOSIS — D509 Iron deficiency anemia, unspecified: Secondary | ICD-10-CM | POA: Diagnosis not present

## 2020-12-06 DIAGNOSIS — N184 Chronic kidney disease, stage 4 (severe): Secondary | ICD-10-CM

## 2020-12-06 LAB — CMP (CANCER CENTER ONLY)
ALT: 24 U/L (ref 0–44)
AST: 29 U/L (ref 15–41)
Albumin: 3.6 g/dL (ref 3.5–5.0)
Alkaline Phosphatase: 114 U/L (ref 38–126)
Anion gap: 8 (ref 5–15)
BUN: 107 mg/dL — ABNORMAL HIGH (ref 8–23)
CO2: 24 mmol/L (ref 22–32)
Calcium: 8.9 mg/dL (ref 8.9–10.3)
Chloride: 109 mmol/L (ref 98–111)
Creatinine: 2.73 mg/dL — ABNORMAL HIGH (ref 0.61–1.24)
GFR, Estimated: 21 mL/min — ABNORMAL LOW (ref >60)
Glucose, Bld: 85 mg/dL (ref 70–99)
Potassium: 4.3 mmol/L (ref 3.5–5.1)
Sodium: 141 mmol/L (ref 135–145)
Total Bilirubin: 0.4 mg/dL (ref 0.3–1.2)
Total Protein: 7.4 g/dL (ref 6.5–8.1)

## 2020-12-06 LAB — CBC WITH DIFFERENTIAL/PLATELET
Abs Immature Granulocytes: 0.15 10*3/uL — ABNORMAL HIGH (ref 0.00–0.07)
Basophils Absolute: 0.1 10*3/uL (ref 0.0–0.1)
Basophils Relative: 1 %
Eosinophils Absolute: 0.7 10*3/uL — ABNORMAL HIGH (ref 0.0–0.5)
Eosinophils Relative: 5 %
HCT: 23.4 % — ABNORMAL LOW (ref 39.0–52.0)
Hemoglobin: 7.5 g/dL — ABNORMAL LOW (ref 13.0–17.0)
Immature Granulocytes: 1 %
Lymphocytes Relative: 8 %
Lymphs Abs: 1.1 10*3/uL (ref 0.7–4.0)
MCH: 31.6 pg (ref 26.0–34.0)
MCHC: 32.1 g/dL (ref 30.0–36.0)
MCV: 98.7 fL (ref 80.0–100.0)
Monocytes Absolute: 1.7 10*3/uL — ABNORMAL HIGH (ref 0.1–1.0)
Monocytes Relative: 12 %
Neutro Abs: 10.5 10*3/uL — ABNORMAL HIGH (ref 1.7–7.7)
Neutrophils Relative %: 73 %
Platelets: 322 10*3/uL (ref 150–400)
RBC: 2.37 MIL/uL — ABNORMAL LOW (ref 4.22–5.81)
RDW: 18.5 % — ABNORMAL HIGH (ref 11.5–15.5)
WBC: 14.3 10*3/uL — ABNORMAL HIGH (ref 4.0–10.5)
nRBC: 0 % (ref 0.0–0.2)

## 2020-12-06 LAB — TYPE AND SCREEN
ABO/RH(D): B POS
Antibody Screen: NEGATIVE

## 2020-12-07 ENCOUNTER — Other Ambulatory Visit: Payer: Self-pay

## 2020-12-07 DIAGNOSIS — D649 Anemia, unspecified: Secondary | ICD-10-CM

## 2020-12-08 ENCOUNTER — Telehealth: Payer: Self-pay | Admitting: Hematology and Oncology

## 2020-12-08 NOTE — Telephone Encounter (Signed)
Scheduled appointment per 06/23 sch msg. Patient is aware.

## 2020-12-14 ENCOUNTER — Telehealth: Payer: Self-pay

## 2020-12-14 NOTE — Telephone Encounter (Signed)
Spoke with patient's daughter, [name], who called to inform us that she tested positive for COVID on Sunday. Patient lives with daughter. Advised daughter to have patient tested on day 5 of exposure and to let us know the results. Patient will most likely need to have 7/1 and 7/2 appts cancelled. Daughter verbalized understanding, will call tomorrow with results. Instructed her to take patient to the ED if appts have to be cancelled and he shows any s/s of severe anemia.

## 2020-12-15 ENCOUNTER — Telehealth: Payer: Self-pay

## 2020-12-15 NOTE — Telephone Encounter (Signed)
Received call from patient's daughter, [name], that patient was negative for COVID. Confirmed with Laurence Compton, RN, that patient was ok to come for lab appt on Friday 7/1 and blood appt on Saturday 7/2 barring onset of any COVID symptoms. Patient's other daughter to provide transportation to and from appts.

## 2020-12-16 ENCOUNTER — Other Ambulatory Visit: Payer: Self-pay

## 2020-12-16 ENCOUNTER — Telehealth: Payer: Self-pay

## 2020-12-16 ENCOUNTER — Inpatient Hospital Stay: Payer: HMO | Attending: Hematology and Oncology | Admitting: Hematology and Oncology

## 2020-12-16 ENCOUNTER — Other Ambulatory Visit: Payer: Self-pay | Admitting: *Deleted

## 2020-12-16 DIAGNOSIS — N184 Chronic kidney disease, stage 4 (severe): Secondary | ICD-10-CM | POA: Diagnosis not present

## 2020-12-16 DIAGNOSIS — D631 Anemia in chronic kidney disease: Secondary | ICD-10-CM | POA: Diagnosis not present

## 2020-12-16 DIAGNOSIS — D649 Anemia, unspecified: Secondary | ICD-10-CM | POA: Diagnosis not present

## 2020-12-16 DIAGNOSIS — E611 Iron deficiency: Secondary | ICD-10-CM

## 2020-12-16 DIAGNOSIS — L03116 Cellulitis of left lower limb: Secondary | ICD-10-CM | POA: Diagnosis not present

## 2020-12-16 DIAGNOSIS — L03115 Cellulitis of right lower limb: Secondary | ICD-10-CM | POA: Diagnosis not present

## 2020-12-16 LAB — COMPREHENSIVE METABOLIC PANEL
ALT: 24 U/L (ref 0–44)
AST: 28 U/L (ref 15–41)
Albumin: 3.6 g/dL (ref 3.5–5.0)
Alkaline Phosphatase: 96 U/L (ref 38–126)
Anion gap: 8 (ref 5–15)
BUN: 87 mg/dL — ABNORMAL HIGH (ref 8–23)
CO2: 22 mmol/L (ref 22–32)
Calcium: 9 mg/dL (ref 8.9–10.3)
Chloride: 107 mmol/L (ref 98–111)
Creatinine, Ser: 2.64 mg/dL — ABNORMAL HIGH (ref 0.61–1.24)
GFR, Estimated: 22 mL/min — ABNORMAL LOW (ref >60)
Glucose, Bld: 216 mg/dL — ABNORMAL HIGH (ref 70–99)
Potassium: 4.5 mmol/L (ref 3.5–5.1)
Sodium: 137 mmol/L (ref 135–145)
Total Bilirubin: 0.4 mg/dL (ref 0.3–1.2)
Total Protein: 6.8 g/dL (ref 6.5–8.1)

## 2020-12-16 LAB — CBC WITH DIFFERENTIAL/PLATELET
Abs Immature Granulocytes: 0.06 10*3/uL (ref 0.00–0.07)
Basophils Absolute: 0.1 10*3/uL (ref 0.0–0.1)
Basophils Relative: 1 %
Eosinophils Absolute: 0.8 10*3/uL — ABNORMAL HIGH (ref 0.0–0.5)
Eosinophils Relative: 8 %
HCT: 21.4 % — ABNORMAL LOW (ref 39.0–52.0)
Hemoglobin: 6.7 g/dL — CL (ref 13.0–17.0)
Immature Granulocytes: 1 %
Lymphocytes Relative: 8 %
Lymphs Abs: 0.9 10*3/uL (ref 0.7–4.0)
MCH: 31.2 pg (ref 26.0–34.0)
MCHC: 31.3 g/dL (ref 30.0–36.0)
MCV: 99.5 fL (ref 80.0–100.0)
Monocytes Absolute: 1.4 10*3/uL — ABNORMAL HIGH (ref 0.1–1.0)
Monocytes Relative: 13 %
Neutro Abs: 7.7 10*3/uL (ref 1.7–7.7)
Neutrophils Relative %: 69 %
Platelets: 294 10*3/uL (ref 150–400)
RBC: 2.15 MIL/uL — ABNORMAL LOW (ref 4.22–5.81)
RDW: 18 % — ABNORMAL HIGH (ref 11.5–15.5)
WBC: 10.9 10*3/uL — ABNORMAL HIGH (ref 4.0–10.5)
nRBC: 0 % (ref 0.0–0.2)

## 2020-12-16 LAB — SAMPLE TO BLOOD BANK

## 2020-12-16 LAB — PREPARE RBC (CROSSMATCH)

## 2020-12-16 NOTE — Telephone Encounter (Signed)
CRITICAL VALUE STICKER  CRITICAL VALUE: Hgb 6.7  RECEIVER (on-site recipient of call): Maygan Rollene Rotunda, RN given to Evette Georges, [place] NOTIFIED: 9:15 am  MESSENGER (representative from lab):  MD NOTIFIED: Dr. Chryl Heck  TIME OF NOTIFICATION: 9:20 am  RESPONSE: Dr. Chryl Heck advised me to move forward with placing his blood orders in for tomorrow 12/17/2020. 1 unit PRBC with 650 mg Tylenol for premed. Evette Georges, CMA

## 2020-12-16 NOTE — Progress Notes (Signed)
CRITICAL VALUE STICKER  CRITICAL VALUE: hbg 6.7  RECEIVER (on-site recipient of call): Terrence Dupont, RN  [place] NOTIFIED: 12/16/20 at 0900  MESSENGER (representative from lab): Hillary  MD NOTIFIED: Burr Medico  TIME OF NOTIFICATION:12/16/20 at 0910  RESPONSE:  see new orders

## 2020-12-16 NOTE — Progress Notes (Signed)
Reentering Prepare and Type & Screen

## 2020-12-17 ENCOUNTER — Other Ambulatory Visit: Payer: Self-pay

## 2020-12-17 ENCOUNTER — Inpatient Hospital Stay: Payer: HMO

## 2020-12-17 DIAGNOSIS — D649 Anemia, unspecified: Secondary | ICD-10-CM

## 2020-12-17 DIAGNOSIS — N184 Chronic kidney disease, stage 4 (severe): Secondary | ICD-10-CM

## 2020-12-17 DIAGNOSIS — D631 Anemia in chronic kidney disease: Secondary | ICD-10-CM

## 2020-12-17 MED ORDER — ACETAMINOPHEN 325 MG PO TABS
ORAL_TABLET | ORAL | Status: AC
  Filled 2020-12-17: qty 2

## 2020-12-17 MED ORDER — SODIUM CHLORIDE 0.9% IV SOLUTION
250.0000 mL | Freq: Once | INTRAVENOUS | Status: AC
  Administered 2020-12-17: 250 mL via INTRAVENOUS
  Filled 2020-12-17: qty 250

## 2020-12-17 MED ORDER — ACETAMINOPHEN 325 MG PO TABS
650.0000 mg | ORAL_TABLET | Freq: Once | ORAL | Status: AC
  Administered 2020-12-17: 650 mg via ORAL

## 2020-12-18 LAB — TYPE AND SCREEN
ABO/RH(D): B POS
Antibody Screen: NEGATIVE
Unit division: 0
Unit division: 0

## 2020-12-18 LAB — BPAM RBC
Blood Product Expiration Date: 202207192359
Blood Product Expiration Date: 202207242359
ISSUE DATE / TIME: 202207020912
ISSUE DATE / TIME: 202207020912
Unit Type and Rh: 7300
Unit Type and Rh: 7300

## 2020-12-20 ENCOUNTER — Other Ambulatory Visit: Payer: Self-pay | Admitting: Cardiology

## 2020-12-20 NOTE — Progress Notes (Signed)
  FROM NEPHROLOGIST: 11/01/2020 Na+ 141, K+ 4.7, Cl- 106, HCO3- 21 , BUN 94, Cr 2.68, Glu 189, Ca2+ 8.9; Phos 4.5 Hgb 6.7; Fe 15 (low), TIBC  338 (nl) ; Fe Sat 4%; UIBC 323   Glenetta Hew, MD

## 2020-12-22 ENCOUNTER — Telehealth: Payer: Self-pay | Admitting: Hematology and Oncology

## 2020-12-22 NOTE — Telephone Encounter (Signed)
R/s appt per 7/6 sch msg. Pt's daughter is aware.

## 2020-12-25 ENCOUNTER — Other Ambulatory Visit: Payer: Self-pay | Admitting: Endocrinology

## 2020-12-25 ENCOUNTER — Encounter: Payer: Self-pay | Admitting: Hematology and Oncology

## 2020-12-26 ENCOUNTER — Telehealth: Payer: Self-pay | Admitting: Hematology and Oncology

## 2020-12-26 ENCOUNTER — Other Ambulatory Visit: Payer: Self-pay

## 2020-12-26 ENCOUNTER — Telehealth: Payer: Self-pay

## 2020-12-26 ENCOUNTER — Ambulatory Visit (HOSPITAL_COMMUNITY)
Admission: RE | Admit: 2020-12-26 | Discharge: 2020-12-26 | Disposition: A | Discharge status: Home / Self Care | Payer: HMO | Source: Ambulatory Visit | Attending: Hematology and Oncology | Admitting: Hematology and Oncology

## 2020-12-26 ENCOUNTER — Inpatient Hospital Stay: Payer: HMO

## 2020-12-26 ENCOUNTER — Inpatient Hospital Stay: Payer: HMO | Admitting: Hematology and Oncology

## 2020-12-26 VITALS — BP 145/48 | HR 83 | Temp 97.7°F | Resp 18 | Wt 195.0 lb

## 2020-12-26 DIAGNOSIS — D649 Anemia, unspecified: Secondary | ICD-10-CM

## 2020-12-26 DIAGNOSIS — L03115 Cellulitis of right lower limb: Secondary | ICD-10-CM | POA: Diagnosis present

## 2020-12-26 DIAGNOSIS — D631 Anemia in chronic kidney disease: Secondary | ICD-10-CM

## 2020-12-26 DIAGNOSIS — N184 Chronic kidney disease, stage 4 (severe): Secondary | ICD-10-CM

## 2020-12-26 LAB — COMPREHENSIVE METABOLIC PANEL
ALT: 23 U/L (ref 0–44)
AST: 30 U/L (ref 15–41)
Albumin: 3.1 g/dL — ABNORMAL LOW (ref 3.5–5.0)
Alkaline Phosphatase: 119 U/L (ref 38–126)
Anion gap: 11 (ref 5–15)
BUN: 100 mg/dL — ABNORMAL HIGH (ref 8–23)
CO2: 25 mmol/L (ref 22–32)
Calcium: 9.2 mg/dL (ref 8.9–10.3)
Chloride: 106 mmol/L (ref 98–111)
Creatinine, Ser: 2.65 mg/dL — ABNORMAL HIGH (ref 0.61–1.24)
GFR, Estimated: 22 mL/min — ABNORMAL LOW (ref >60)
Glucose, Bld: 194 mg/dL — ABNORMAL HIGH (ref 70–99)
Potassium: 4.5 mmol/L (ref 3.5–5.1)
Sodium: 142 mmol/L (ref 135–145)
Total Bilirubin: 0.2 mg/dL — ABNORMAL LOW (ref 0.3–1.2)
Total Protein: 7 g/dL (ref 6.5–8.1)

## 2020-12-26 LAB — CBC WITH DIFFERENTIAL/PLATELET
Abs Immature Granulocytes: 0.1 10*3/uL — ABNORMAL HIGH (ref 0.00–0.07)
Basophils Absolute: 0.1 10*3/uL (ref 0.0–0.1)
Basophils Relative: 1 %
Eosinophils Absolute: 0.8 10*3/uL — ABNORMAL HIGH (ref 0.0–0.5)
Eosinophils Relative: 6 %
HCT: 26.5 % — ABNORMAL LOW (ref 39.0–52.0)
Hemoglobin: 8.4 g/dL — ABNORMAL LOW (ref 13.0–17.0)
Immature Granulocytes: 1 %
Lymphocytes Relative: 8 %
Lymphs Abs: 1 10*3/uL (ref 0.7–4.0)
MCH: 31 pg (ref 26.0–34.0)
MCHC: 31.7 g/dL (ref 30.0–36.0)
MCV: 97.8 fL (ref 80.0–100.0)
Monocytes Absolute: 1.4 10*3/uL — ABNORMAL HIGH (ref 0.1–1.0)
Monocytes Relative: 11 %
Neutro Abs: 9.9 10*3/uL — ABNORMAL HIGH (ref 1.7–7.7)
Neutrophils Relative %: 73 %
Platelets: 334 10*3/uL (ref 150–400)
RBC: 2.71 MIL/uL — ABNORMAL LOW (ref 4.22–5.81)
RDW: 16.4 % — ABNORMAL HIGH (ref 11.5–15.5)
WBC: 13.3 10*3/uL — ABNORMAL HIGH (ref 4.0–10.5)
nRBC: 0 % (ref 0.0–0.2)

## 2020-12-26 LAB — SAMPLE TO BLOOD BANK

## 2020-12-26 MED ORDER — CLINDAMYCIN HCL 300 MG PO CAPS: 300.0000 mg | ORAL_CAPSULE | Freq: Three times a day (TID) | ORAL | 0 refills | Status: DC

## 2020-12-26 NOTE — Telephone Encounter (Signed)
Scheduled appointment per 07/11 sch msg. Patient is aware.

## 2020-12-26 NOTE — Progress Notes (Signed)
[place] NOTE  Patient Care Team: Lujean Amel, MD as PCP - General (Family Medicine) Leonie Man, MD as PCP - Cardiology (Cardiology) Elayne Snare, MD as Attending Physician (Endocrinology)  CHIEF COMPLAINTS/PURPOSE OF CONSULTATION:   Bilateral leg swelling  ASSESSMENT & PLAN:   This is a very pleasant 87 year old male patient who was seen previously for management of anemia who returns here for unplanned follow up given BLE swelling. He got blood transfusion on 12/17/2020. Daughter messaged [place] that he has worsening BLE swelling hence was arranged for same-day follow-up. He complains of pain in his right lower extremity and worsening swelling in the right lower extremity compared to the left.  No fevers or chills or other evidence of systemic infection. Physical examination today, bilateral lower extremity swelling, right greater than left and some evidence of cellulitis in the right leg without any purulence. Given his penicillin allergy and undefined reaction as well as limitation using certain antibiotics with his kidney impairment, we have recommended trying clindamycin and returning to clinic in a few days for reevaluation.  Bilateral lower extremity ultrasound was negative for DVT.  Thank you for consulting [place] in the care of this patient.  Please not hesitate to contact us with any additional questions or concerns.  HISTORY OF PRESENTING ILLNESS:  Clayton [name] 87 y.o. male is here because of ongoing anemia.  Clayton [name] is a 87 yr old male patient with PMH significant for BPH, CKD, COPD, DM, HTN referred to hematology for evaluation of anemia.    Interval History  He is here with his son-in-law.  He complains of pain in the right lower extremity and a worsening swelling in the right lower extremity.  He was wondering if this can be related to the blood transfusion.  No fevers or chills.  Appetite is okay, he is able to eat well.  No change in  breathing, severe back pain or dark urine.  Rest of the pertinent review of systems reviewed and negative.  MEDICAL HISTORY:  Past Medical History:  Diagnosis Date   Anemia    BPH (benign prostatic hyperplasia)    CAD (coronary artery disease)    hx of stemi  04-2018    Chronic renal failure    Chronic renal insufficiency    COPD (chronic obstructive pulmonary disease) (HCC)    DDD (degenerative disc disease), lumbar    Diabetes mellitus    Hyperlipidemia    Hyperparathyroidism ([place])    Hypertension    IDA (iron deficiency anemia)    Neuromuscular disorder (HCC)    OSA on CPAP    Osteomyelitis of forearm ([place])    Osteoporosis     SURGICAL HISTORY: Past Surgical History:  Procedure Laterality Date   CORONARY STENT INTERVENTION N/A 04/20/2018   Procedure: CORONARY STENT INTERVENTION;  Surgeon: Leonie Man, MD;  Location: [facility] INVASIVE CV LAB;;;     CORONARY/GRAFT ACUTE MI REVASCULARIZATION N/A 04/20/2018   Procedure: Coronary/Graft Acute MI Revascularization;  Surgeon: Leonie Man, MD;  Location: [facility] CV LAB;  Service: Cardiovascular;  Laterality: N/A; --> after initial PTCA restoring flow down the RCA, there was diffuse proximal to mid disease treated with a DES SYNERGY 3 X 38 ( 3.29mm)   EYE SURGERY     HOLTER MONITOR  05/2018   Sinus rhythm noted with sinus bradycardia.  Also sinus rhythm with 2-1 AV block noted.  Maximum heart rate was sinus tachycardia 123 bpm.  Rare PVCs accelerated idioventricular  rhythm noted.  Intermittent Wenckebach block noted.   I & D EXTREMITY Right 08/13/2016   Procedure: IRRIGATION AND DEBRIDEMENT RIGHT ELBOW AND HAND;  Surgeon: Milly Jakob, MD;  Location: [facility] ORS;  Service: Orthopedics;  Laterality: Right;   LEFT HEART CATH AND CORONARY ANGIOGRAPHY N/A 04/20/2018   Procedure: LEFT HEART CATH AND CORONARY ANGIOGRAPHY;  Surgeon: Leonie Man, MD;  Location: [facility] CV LAB; mRCA 100%&80% (after 55% tapering in pRCA), mLM-ostLAD 40%  w/ 85% ostCx, ost-pCx 65% @ Om1 w/ ost 85%. pLAD 60% & 50% after D1 with distal 75%, ostD1 65% & 90% after small side branch.     TRANSTHORACIC ECHOCARDIOGRAM  04/21/2018   EF 50-55%. Posterior Wall HK. Severe RV HK, dilated IVC. Temp wire in place   VASECTOMY     VIDEO BRONCHOSCOPY Bilateral 08/25/2012   Procedure: VIDEO BRONCHOSCOPY WITHOUT FLUORO;  Surgeon: Brand Males, MD;  Location: [facility];  Service: Cardiopulmonary;  Laterality: Bilateral;    SOCIAL HISTORY: Social History   Socioeconomic History   Marital status: Widowed    Spouse name: Not on file   Number of children: 4   Years of education: 16   Highest education level: Bachelor's degree (e.g., BA, AB, BS)  Occupational History   Occupation: retired  Tobacco Use   Smoking status: Former    Packs/day: 3.00    Years: 33.00    Pack years: 99.00    Types: Cigarettes    Quit date: 06/19/1973    Years since quitting: 47.5   Smokeless tobacco: Former    Quit date: 09/26/1975  Vaping Use   Vaping Use: Never used  Substance and Sexual Activity   Alcohol use: No   Drug use: No   Sexual activity: Not Currently  Other Topics Concern   Not on file  Social History Narrative   He is accompanied by his daughter who seems to be quite involved with his care.      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 He also went to the [facility] several times a week until he began to have symptoms.    Social Determinants of Health   Financial Resource Strain: Not on file  Food Insecurity: Not on file  Transportation Needs: Not on file  Physical Activity: Not on file  Stress: Not on file  Social Connections: Not on file  Intimate Partner Violence: Not on file    FAMILY HISTORY: Family History  Problem Relation Age of Onset   Diabetes Mellitus II Other    Hypertension Other    Other Father        complications from strep   CVA Father    Cancer Sister    Other Brother        hip  replacement complications     ALLERGIES:  is allergic to doxycycline, levaquin [levofloxacin in d5w], aliskiren, lisinopril, losartan potassium, nitrous oxide, penicillins, tape, and diltiazem.  MEDICATIONS:  Current Outpatient Medications  Medication Sig Dispense Refill   clindamycin (CLEOCIN) 300 MG capsule Take 1 capsule (300 mg total) by mouth 3 (three) times daily. 21 capsule 0   ACCU-CHEK SOFTCLIX LANCETS lancets TEST BLOOD SUGAR FOUR TIMES DAILY 400 each 1   albuterol (PROVENTIL HFA;VENTOLIN HFA) 108 (90 Base) MCG/ACT inhaler Inhale 1-2 puffs into the lungs every 6 (six) hours as needed for wheezing or shortness of breath.     Alcohol Swabs (B-D SINGLE USE SWABS REGULAR) PADS USE 7 SWABS DAILY  AS DIRECTED 600 each 0   aspirin EC 81 MG tablet Take 1 tablet (81 mg total) by mouth daily. Swallow whole. 90 tablet 3   atorvastatin (LIPITOR) 40 MG tablet Take 1 tablet (40 mg total) by mouth daily. 90 tablet 3   benzonatate (TESSALON) 100 MG capsule Take by mouth 3 (three) times daily as needed for cough.     budesonide-formoterol (SYMBICORT) 160-4.5 MCG/ACT inhaler Inhale 2 puffs into the lungs 2 (two) times daily. 10.2 g 5   cholecalciferol (VITAMIN D) 1000 units tablet Take 2,000 Units by mouth daily.     Continuous Blood Gluc Receiver (FREESTYLE LIBRE 14 DAY READER) DEVI 1 each by Does not apply route every 14 (fourteen) days. Use reader to monitor blood sugar continuously with freestyle libre sensor. 1 each 0   Continuous Blood Gluc Sensor (FREESTYLE LIBRE 14 DAY SENSOR) MISC USE AS DIRECTED EVERY 14 DAYS TO MONITOR BLOOD SUGAR CONTINUOUSLY 6 each 3   DROPLET INSULIN SYRINGE 31G X 5/16" 0.3 ML MISC USE THREE TIMES DAILY 300 each 2   finasteride (PROSCAR) 5 MG tablet TAKE 1 TABLET EVERY DAY 90 tablet 1   furosemide (LASIX) 20 MG tablet Take 1 tablet (20 mg total) by mouth 2 (two) times daily. Take seocnd dose about 12 noon each daily 180 tablet 3   glucose blood test strip Use as instructed  to test blood sugars 4 times daily 150 each 12   insulin aspart (NOVOLOG) 100 UNIT/ML injection INJECT 7 UNITS SUBCUTANEOUS EVERY MORNING, 6 UNITS AT LUNCH, AND 9 UNITS AT SUPPER 30 mL 3   insulin glargine (LANTUS) 100 UNIT/ML injection Inject 0.16 mLs (16 Units total) into the skin daily. 32 mL 2   isosorbide mononitrate (IMDUR) 30 MG 24 hr tablet TAKE 1 TABLET BY MOUTH DAILY 30 tablet 8   Melatonin 5 MG TABS Take 2 tablets by mouth at bedtime.      montelukast (SINGULAIR) 10 MG tablet      Multiple Vitamin (MULTIVITAMIN WITH MINERALS) TABS tablet Take 1 tablet by mouth daily.     nitroGLYCERIN (NITROSTAT) 0.4 MG SL tablet Place 1 tablet (0.4 mg total) under the tongue every 5 (five) minutes as needed for chest pain. 25 tablet 6   omeprazole (PRILOSEC) 20 MG capsule Take 1 capsule (20 mg total) by mouth daily. 30 capsule 3   oxybutynin (DITROPAN) 5 MG tablet      No current facility-administered medications for this visit.     PHYSICAL EXAMINATION:  ECOG PERFORMANCE STATUS: 0 - Asymptomatic  Vitals:   12/26/20 1346  BP: (!) 145/48  Pulse: 83  Resp: 18  Temp: 97.7 F (36.5 C)  SpO2: 100%    Filed Weights   12/26/20 1346  Weight: 195 lb (88.5 kg)    Physical Exam Constitutional:      General: He is not in acute distress.    Appearance: Normal appearance.  HENT:     Head: Normocephalic and atraumatic.  Cardiovascular:     Rate and Rhythm: Normal rate and regular rhythm.     Pulses: Normal pulses.     Heart sounds: Normal heart sounds.  Pulmonary:     Effort: Pulmonary effort is normal.     Breath sounds: No rales.  Abdominal:     General: Abdomen is flat. Bowel sounds are normal.     Palpations: Abdomen is soft.  Musculoskeletal:        General: Normal range of motion.     Right  lower leg: Edema (Skin changes concerning for cellulitis without any purulence, right lower extremity swelling worse than left lower extremity) present.     Left lower leg: Edema present.   Skin:    General: Skin is warm and dry.  Neurological:     General: No focal deficit present.     Mental Status: He is alert.  Psychiatric:        Mood and Affect: Mood normal.   LABORATORY DATA:  I have reviewed the data as listed  Lab Results  Component Value Date   WBC 13.3 (H) 12/26/2020   HGB 8.4 (L) 12/26/2020   HCT 26.5 (L) 12/26/2020   MCV 97.8 12/26/2020   PLT 334 12/26/2020     Chemistry      Component Value Date/Time   NA 142 12/26/2020 1315   K 4.5 12/26/2020 1315   CL 106 12/26/2020 1315   CO2 25 12/26/2020 1315   BUN 100 (H) 12/26/2020 1315   CREATININE 2.65 (H) 12/26/2020 1315   CREATININE 2.73 (H) 12/06/2020 0926   GLU 146 03/09/2020 0000      Component Value Date/Time   CALCIUM 9.2 12/26/2020 1315   ALKPHOS 119 12/26/2020 1315   AST 30 12/26/2020 1315   AST 29 12/06/2020 0926   ALT 23 12/26/2020 1315   ALT 24 12/06/2020 0926   BILITOT 0.2 (L) 12/26/2020 1315   BILITOT 0.4 12/06/2020 0926      RADIOGRAPHIC STUDIES:  I have personally reviewed the radiological images as listed and agreed with the findings in the report.  LONG TERM MONITOR (3-14 DAYS)  Result Date: 12/15/2020  Predominant rhythm is sinus with a minimum heart rate of 32 bpm and maximum of 112 bpm. Average heart rate 76 bpm.  1  AV block noted at baseline along with intermittent Wenkebach block (2  AVB -Mobitz 1 -> intermittently dropping beats after prolonging conduction, usually benign)  Occasional PVCs noted with rare couplets and triplets. Occasional bigeminy.  Very rare PACs noted.  No patient triggered events.  Slowest heart rates noted at 5:30 AM. Fastest heart rate at 7:26 PM  Patch Wear Time:  7 days and 9 hours (2022-06-03T09:32:29-0400 to 2022-06-10T19:32:05-399) With the results of the study, the biggest thing we would want to avoid is any medicine that would slow the heart rate down.  It does show that he is able to get his heart rate up to an adequate level that he  probably would not require pacemaker. The slow heart rates are associated with some dropped beats, and usually happen early in the morning when it is not overly concerning. At this point I would just continue watchful waiting.  I do not think this explains fatigue. Glenetta Hew, MD  VAS [place] LOWER EXTREMITY VENOUS (DVT)  Result Date: 12/26/2020  Lower Venous DVT Study Patient Name:  Clayton [name]  Date of Exam:   12/26/2020 Medical Rec #: [id number]        Accession #:    [id number] Date of Birth: Aug 25, 1927        Patient Gender: M Patient Age:   89Y Exam Location:  [facility] Procedure:      VAS [place] LOWER EXTREMITY VENOUS (DVT) Referring Phys: [id number] Kyrianna Barletta --------------------------------------------------------------------------------  Indications: Swelling, Pain, and Erythema.  Limitations: Poor ultrasound/tissue interface. Comparison Study: No prior study Performing Technologist: Maudry Mayhew MHA, RDMS, RVT, RDCS  Examination Guidelines: A complete evaluation includes B-mode imaging, spectral Doppler, color Doppler, and power Doppler as  needed of all accessible portions of each vessel. Bilateral testing is considered an integral part of a complete examination. Limited examinations for reoccurring indications may be performed as noted. The reflux portion of the exam is performed with the patient in reverse Trendelenburg.  +---------+---------------+---------+-----------+----------+--------------+ RIGHT    CompressibilityPhasicitySpontaneityPropertiesThrombus Aging +---------+---------------+---------+-----------+----------+--------------+ CFV      Full           Yes      Yes                                 +---------+---------------+---------+-----------+----------+--------------+ SFJ      Full                                                        +---------+---------------+---------+-----------+----------+--------------+ FV Prox  Full                                                         +---------+---------------+---------+-----------+----------+--------------+ FV Mid   Full                                                        +---------+---------------+---------+-----------+----------+--------------+ FV DistalFull                                                        +---------+---------------+---------+-----------+----------+--------------+ PFV      Full                                                        +---------+---------------+---------+-----------+----------+--------------+ POP      Full           Yes      Yes                                 +---------+---------------+---------+-----------+----------+--------------+ PTV                              Yes                                 +---------+---------------+---------+-----------+----------+--------------+ PERO                             Yes                                 +---------+---------------+---------+-----------+----------+--------------+  Right Technical Findings: Not visualized segments include limited visualization PTV, peroneal veins.  +---------+---------------+---------+-----------+----------+--------------+ LEFT     CompressibilityPhasicitySpontaneityPropertiesThrombus Aging +---------+---------------+---------+-----------+----------+--------------+ CFV      Full           Yes      Yes                                 +---------+---------------+---------+-----------+----------+--------------+ SFJ      Full                                                        +---------+---------------+---------+-----------+----------+--------------+ FV Prox  Full                                                        +---------+---------------+---------+-----------+----------+--------------+ FV Mid   Full                                                         +---------+---------------+---------+-----------+----------+--------------+ FV DistalFull                                                        +---------+---------------+---------+-----------+----------+--------------+ PFV      Full                                                        +---------+---------------+---------+-----------+----------+--------------+ POP      Full           Yes      Yes                                 +---------+---------------+---------+-----------+----------+--------------+   Left Technical Findings: Not visualized segments include PTV, peroneal veins.   Summary: RIGHT: - There is no evidence of deep vein thrombosis in the lower extremity. However, portions of this examination were limited- see technologist comments above.  - No cystic structure found in the popliteal fossa.  LEFT: - There is no evidence of deep vein thrombosis in the lower extremity. However, portions of this examination were limited- see technologist comments above.  - No cystic structure found in the popliteal fossa.  *See table(s) above for measurements and observations. Electronically signed by Monica Martinez MD on 12/26/2020 at 6:56:07 PM.    Final      All questions were answered. The patient knows to call the clinic with any problems, questions or concerns. Labs from yesterday shows white count of 13,300, hemoglobin of 8.4 and platelet count of 334,000. Spent 30 minutes or more in the care of this patient  including coordination of care with his daughters, discussion about plan of care with patient, family and follow-up recommendations  Benay Pike, MD 12/27/2020 3:07 PM

## 2020-12-26 NOTE — Telephone Encounter (Signed)
Scheduled per 7/11 sch msg. Called and spoke with pt [name] confirmed appt times

## 2020-12-26 NOTE — Telephone Encounter (Signed)
Called and spoke with patient's daughter. She said her father had been experiencing BIL leg swelling since blood transfusion. Per Dr Chryl Heck, patient to have labs and MD visit today. Patient's daughter verbalized understanding; stated that her brother would be bringing patient in today.

## 2020-12-26 NOTE — Progress Notes (Signed)
Bilateral lower extremity venous duplex completed. Refer to "CV Proc" under chart review to view preliminary results.  12/26/2020 4:13 PM Kelby Aline., MHA, RVT, RDCS, RDMS

## 2020-12-27 ENCOUNTER — Ambulatory Visit: Payer: HMO | Admitting: Hematology and Oncology

## 2020-12-27 ENCOUNTER — Encounter: Payer: Self-pay | Admitting: Hematology and Oncology

## 2020-12-29 ENCOUNTER — Inpatient Hospital Stay (HOSPITAL_BASED_OUTPATIENT_CLINIC_OR_DEPARTMENT_OTHER): Payer: HMO | Admitting: Hematology and Oncology

## 2020-12-29 ENCOUNTER — Other Ambulatory Visit: Payer: Self-pay

## 2020-12-29 ENCOUNTER — Encounter: Payer: Self-pay | Admitting: Hematology and Oncology

## 2020-12-29 VITALS — BP 146/56 | HR 82 | Temp 97.5°F | Resp 18 | Wt 199.0 lb

## 2020-12-29 DIAGNOSIS — L03115 Cellulitis of right lower limb: Secondary | ICD-10-CM | POA: Diagnosis not present

## 2020-12-29 DIAGNOSIS — D649 Anemia, unspecified: Secondary | ICD-10-CM | POA: Diagnosis not present

## 2020-12-29 NOTE — Progress Notes (Signed)
[place] NOTE  Patient Care Team: Lujean Amel, MD as PCP - General (Family Medicine) Leonie Man, MD as PCP - Cardiology (Cardiology) Elayne Snare, MD as Attending Physician (Endocrinology)  CHIEF COMPLAINTS/PURPOSE OF CONSULTATION:   Bilateral leg swelling  ASSESSMENT & PLAN:   This is a very pleasant 87 year old male patient who was seen previously for management of anemia who returns here for unplanned follow up given BLE swelling. He was seen on Monday and we have prescribed antibiotics for right lower extremity cellulitis, started on clindamycin. Ultrasound of bilateral lower extremity without any evidence of DVT.  He is here for follow-up on his cellulitis.  Erythema looks slightly better and so does the swelling.  There appears to be no systemic signs of infection.  Okay to continue clindamycin and return to clinic for follow-up early next week.  I have recommended to take him to the hospital over the weekend if he starts showing any worsening symptoms, confusion, fevers or chills.  Patient and family expressed understanding.  Thank you for consulting [place] in the care of this patient.  Please not hesitate to contact us with any additional questions or concerns.  HISTORY OF PRESENTING ILLNESS:  Clayton [name] 87 y.o. male is here because of ongoing anemia.  Clayton [name] is a 87 yr old male patient with PMH significant for BPH, CKD, COPD, DM, HTN referred to hematology for evaluation of anemia.    Interval History  He is here with his youngest daughter for follow-up on his right lower extremity swelling.  He has been tolerating clindamycin well, no complaints.  Right lower extremity still painful but slightly less swollen and with slightly less erythema. No fevers or chills.  He has been eating and drinking well. Rest of the pertinent review of systems reviewed and negative.  MEDICAL HISTORY:  Past Medical History:  Diagnosis Date   Anemia    BPH  (benign prostatic hyperplasia)    CAD (coronary artery disease)    hx of stemi  04-2018    Chronic renal failure    Chronic renal insufficiency    COPD (chronic obstructive pulmonary disease) (HCC)    DDD (degenerative disc disease), lumbar    Diabetes mellitus    Hyperlipidemia    Hyperparathyroidism ([place])    Hypertension    IDA (iron deficiency anemia)    Neuromuscular disorder (HCC)    OSA on CPAP    Osteomyelitis of forearm ([place])    Osteoporosis     SURGICAL HISTORY: Past Surgical History:  Procedure Laterality Date   CORONARY STENT INTERVENTION N/A 04/20/2018   Procedure: CORONARY STENT INTERVENTION;  Surgeon: Leonie Man, MD;  Location: [facility] INVASIVE CV LAB;;;     CORONARY/GRAFT ACUTE MI REVASCULARIZATION N/A 04/20/2018   Procedure: Coronary/Graft Acute MI Revascularization;  Surgeon: Leonie Man, MD;  Location: [place] CV LAB;  Service: Cardiovascular;  Laterality: N/A; --> after initial PTCA restoring flow down the RCA, there was diffuse proximal to mid disease treated with a DES SYNERGY 3 X 38 ( 3.68mm)   EYE SURGERY     HOLTER MONITOR  05/2018   Sinus rhythm noted with sinus bradycardia.  Also sinus rhythm with 2-1 AV block noted.  Maximum heart rate was sinus tachycardia 123 bpm.  Rare PVCs accelerated idioventricular rhythm noted.  Intermittent Wenckebach block noted.   I & D EXTREMITY Right 08/13/2016   Procedure: IRRIGATION AND DEBRIDEMENT RIGHT ELBOW AND HAND;  Surgeon: Milly Jakob, MD;  Location: [facility];  Service: Orthopedics;  Laterality: Right;   LEFT HEART CATH AND CORONARY ANGIOGRAPHY N/A 04/20/2018   Procedure: LEFT HEART CATH AND CORONARY ANGIOGRAPHY;  Surgeon: Leonie Man, MD;  Location: [place] CV LAB; mRCA 100%&80% (after 55% tapering in pRCA), mLM-ostLAD 40% w/ 85% ostCx, ost-pCx 65% @ Om1 w/ ost 85%. pLAD 60% & 50% after D1 with distal 75%, ostD1 65% & 90% after small side branch.     TRANSTHORACIC ECHOCARDIOGRAM  04/21/2018   EF 50-55%.  Posterior Wall HK. Severe RV HK, dilated IVC. Temp wire in place   VASECTOMY     VIDEO BRONCHOSCOPY Bilateral 08/25/2012   Procedure: VIDEO BRONCHOSCOPY WITHOUT FLUORO;  Surgeon: Brand Males, MD;  Location: [facility];  Service: Cardiopulmonary;  Laterality: Bilateral;    SOCIAL HISTORY: Social History   Socioeconomic History   Marital status: Widowed    Spouse name: Not on file   Number of children: 4   Years of education: 16   Highest education level: Bachelor's degree (e.g., BA, AB, BS)  Occupational History   Occupation: retired  Tobacco Use   Smoking status: Former    Packs/day: 3.00    Years: 33.00    Pack years: 99.00    Types: Cigarettes    Quit date: 06/19/1973    Years since quitting: 47.5   Smokeless tobacco: Former    Quit date: 09/26/1975  Vaping Use   Vaping Use: Never used  Substance and Sexual Activity   Alcohol use: No   Drug use: No   Sexual activity: Not Currently  Other Topics Concern   Not on file  Social History Narrative   He is accompanied by his daughter who seems to be quite involved with his care.      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 He also went to the [facility] several times a week until he began to have symptoms.    Social Determinants of Health   Financial Resource Strain: Not on file  Food Insecurity: Not on file  Transportation Needs: Not on file  Physical Activity: Not on file  Stress: Not on file  Social Connections: Not on file  Intimate Partner Violence: Not on file    FAMILY HISTORY: Family History  Problem Relation Age of Onset   Diabetes Mellitus II Other    Hypertension Other    Other Father        complications from strep   CVA Father    Cancer Sister    Other Brother        hip replacement complications     ALLERGIES:  is allergic to doxycycline, levaquin [levofloxacin in d5w], aliskiren, lisinopril, losartan potassium, nitrous oxide, penicillins, tape, and  diltiazem.  MEDICATIONS:  Current Outpatient Medications  Medication Sig Dispense Refill   ACCU-CHEK SOFTCLIX LANCETS lancets TEST BLOOD SUGAR FOUR TIMES DAILY 400 each 1   albuterol (PROVENTIL HFA;VENTOLIN HFA) 108 (90 Base) MCG/ACT inhaler Inhale 1-2 puffs into the lungs every 6 (six) hours as needed for wheezing or shortness of breath.     Alcohol Swabs (B-D SINGLE USE SWABS REGULAR) PADS USE 7 SWABS DAILY AS DIRECTED 600 each 0   aspirin EC 81 MG tablet Take 1 tablet (81 mg total) by mouth daily. Swallow whole. 90 tablet 3   atorvastatin (LIPITOR) 40 MG tablet Take 1 tablet (40 mg total) by mouth daily. 90 tablet 3   benzonatate (TESSALON) 100 MG capsule Take  by mouth 3 (three) times daily as needed for cough.     budesonide-formoterol (SYMBICORT) 160-4.5 MCG/ACT inhaler Inhale 2 puffs into the lungs 2 (two) times daily. 10.2 g 5   cholecalciferol (VITAMIN D) 1000 units tablet Take 2,000 Units by mouth daily.     clindamycin (CLEOCIN) 300 MG capsule Take 1 capsule (300 mg total) by mouth 3 (three) times daily. 21 capsule 0   Continuous Blood Gluc Receiver (FREESTYLE LIBRE 14 DAY READER) DEVI 1 each by Does not apply route every 14 (fourteen) days. Use reader to monitor blood sugar continuously with freestyle libre sensor. 1 each 0   Continuous Blood Gluc Sensor (FREESTYLE LIBRE 14 DAY SENSOR) MISC USE AS DIRECTED EVERY 14 DAYS TO MONITOR BLOOD SUGAR CONTINUOUSLY 6 each 3   DROPLET INSULIN SYRINGE 31G X 5/16" 0.3 ML MISC USE THREE TIMES DAILY 300 each 2   finasteride (PROSCAR) 5 MG tablet TAKE 1 TABLET EVERY DAY 90 tablet 1   furosemide (LASIX) 20 MG tablet Take 1 tablet (20 mg total) by mouth 2 (two) times daily. Take seocnd dose about 12 noon each daily 180 tablet 3   glucose blood test strip Use as instructed to test blood sugars 4 times daily 150 each 12   insulin aspart (NOVOLOG) 100 UNIT/ML injection INJECT 7 UNITS SUBCUTANEOUS EVERY MORNING, 6 UNITS AT LUNCH, AND 9 UNITS AT SUPPER 30 mL  3   insulin glargine (LANTUS) 100 UNIT/ML injection Inject 0.16 mLs (16 Units total) into the skin daily. 32 mL 2   isosorbide mononitrate (IMDUR) 30 MG 24 hr tablet TAKE 1 TABLET BY MOUTH DAILY 30 tablet 8   Melatonin 5 MG TABS Take 2 tablets by mouth at bedtime.      montelukast (SINGULAIR) 10 MG tablet      Multiple Vitamin (MULTIVITAMIN WITH MINERALS) TABS tablet Take 1 tablet by mouth daily.     nitroGLYCERIN (NITROSTAT) 0.4 MG SL tablet Place 1 tablet (0.4 mg total) under the tongue every 5 (five) minutes as needed for chest pain. 25 tablet 6   omeprazole (PRILOSEC) 20 MG capsule Take 1 capsule (20 mg total) by mouth daily. 30 capsule 3   oxybutynin (DITROPAN) 5 MG tablet      No current facility-administered medications for this visit.     PHYSICAL EXAMINATION:  ECOG PERFORMANCE STATUS: 0 - Asymptomatic  Vitals:   12/29/20 1419  BP: (!) 146/56  Pulse: 82  Resp: 18  Temp: (!) 97.5 F (36.4 C)  SpO2: 99%    Filed Weights   12/29/20 1419  Weight: 199 lb (90.3 kg)    Physical Exam Constitutional:      General: He is not in acute distress.    Appearance: Normal appearance.  HENT:     Head: Normocephalic and atraumatic.  Cardiovascular:     Rate and Rhythm: Normal rate and regular rhythm.     Pulses: Normal pulses.     Heart sounds: Normal heart sounds.  Pulmonary:     Effort: Pulmonary effort is normal.     Breath sounds: No rales.  Abdominal:     General: Abdomen is flat. Bowel sounds are normal.     Palpations: Abdomen is soft.  Musculoskeletal:        General: Normal range of motion.     Right lower leg: No edema (Skin changes concerning for cellulitis with some yellowish tinge drainage, no clear evidence of pus.  Right lower extremity swelling slightly better than Monday's visit.  Erythema also appears slightly better.  Left lower extremity swelling at baseline).     Left lower leg: Edema present.  Skin:    General: Skin is warm and dry.  Neurological:      General: No focal deficit present.     Mental Status: He is alert.  Psychiatric:        Mood and Affect: Mood normal.   LABORATORY DATA:  I have reviewed the data as listed  Lab Results  Component Value Date   WBC 13.3 (H) 12/26/2020   HGB 8.4 (L) 12/26/2020   HCT 26.5 (L) 12/26/2020   MCV 97.8 12/26/2020   PLT 334 12/26/2020     Chemistry      Component Value Date/Time   NA 142 12/26/2020 1315   K 4.5 12/26/2020 1315   CL 106 12/26/2020 1315   CO2 25 12/26/2020 1315   BUN 100 (H) 12/26/2020 1315   CREATININE 2.65 (H) 12/26/2020 1315   CREATININE 2.73 (H) 12/06/2020 0926   GLU 146 03/09/2020 0000      Component Value Date/Time   CALCIUM 9.2 12/26/2020 1315   ALKPHOS 119 12/26/2020 1315   AST 30 12/26/2020 1315   AST 29 12/06/2020 0926   ALT 23 12/26/2020 1315   ALT 24 12/06/2020 0926   BILITOT 0.2 (L) 12/26/2020 1315   BILITOT 0.4 12/06/2020 0926      RADIOGRAPHIC STUDIES:  I have personally reviewed the radiological images as listed and agreed with the findings in the report.  LONG TERM MONITOR (3-14 DAYS)  Result Date: 12/15/2020  Predominant rhythm is sinus with a minimum heart rate of 32 bpm and maximum of 112 bpm. Average heart rate 76 bpm.  1  AV block noted at baseline along with intermittent Wenkebach block (2  AVB -Mobitz 1 -> intermittently dropping beats after prolonging conduction, usually benign)  Occasional PVCs noted with rare couplets and triplets. Occasional bigeminy.  Very rare PACs noted.  No patient triggered events.  Slowest heart rates noted at 5:30 AM. Fastest heart rate at 7:26 PM  Patch Wear Time:  7 days and 9 hours (2022-06-03T09:32:29-0400 to 2022-06-10T19:32:05-399) With the results of the study, the biggest thing we would want to avoid is any medicine that would slow the heart rate down.  It does show that he is able to get his heart rate up to an adequate level that he probably would not require pacemaker. The slow heart rates are  associated with some dropped beats, and usually happen early in the morning when it is not overly concerning. At this point I would just continue watchful waiting.  I do not think this explains fatigue. Glenetta Hew, MD  VAS [place] LOWER EXTREMITY VENOUS (DVT)  Result Date: 12/26/2020  Lower Venous DVT Study Patient Name:  Clayton [name]  Date of Exam:   12/26/2020 Medical Rec #: [id number]        Accession #:    [id number] Date of Birth: 01/02/1928        Patient Gender: M Patient Age:   89Y Exam Location:  [facility] Procedure:      VAS [place] LOWER EXTREMITY VENOUS (DVT) Referring Phys: [id number] Jahir Halt --------------------------------------------------------------------------------  Indications: Swelling, Pain, and Erythema.  Limitations: Poor ultrasound/tissue interface. Comparison Study: No prior study Performing Technologist: Maudry Mayhew MHA, RDMS, RVT, RDCS  Examination Guidelines: A complete evaluation includes B-mode imaging, spectral Doppler, color Doppler, and power Doppler as needed of all accessible portions of each vessel. Bilateral  testing is considered an integral part of a complete examination. Limited examinations for reoccurring indications may be performed as noted. The reflux portion of the exam is performed with the patient in reverse Trendelenburg.  +---------+---------------+---------+-----------+----------+--------------+ RIGHT    CompressibilityPhasicitySpontaneityPropertiesThrombus Aging +---------+---------------+---------+-----------+----------+--------------+ CFV      Full           Yes      Yes                                 +---------+---------------+---------+-----------+----------+--------------+ SFJ      Full                                                        +---------+---------------+---------+-----------+----------+--------------+ FV Prox  Full                                                         +---------+---------------+---------+-----------+----------+--------------+ FV Mid   Full                                                        +---------+---------------+---------+-----------+----------+--------------+ FV DistalFull                                                        +---------+---------------+---------+-----------+----------+--------------+ PFV      Full                                                        +---------+---------------+---------+-----------+----------+--------------+ POP      Full           Yes      Yes                                 +---------+---------------+---------+-----------+----------+--------------+ PTV                              Yes                                 +---------+---------------+---------+-----------+----------+--------------+ PERO                             Yes                                 +---------+---------------+---------+-----------+----------+--------------+   Right Technical Findings: Not visualized segments include limited  visualization PTV, peroneal veins.  +---------+---------------+---------+-----------+----------+--------------+ LEFT     CompressibilityPhasicitySpontaneityPropertiesThrombus Aging +---------+---------------+---------+-----------+----------+--------------+ CFV      Full           Yes      Yes                                 +---------+---------------+---------+-----------+----------+--------------+ SFJ      Full                                                        +---------+---------------+---------+-----------+----------+--------------+ FV Prox  Full                                                        +---------+---------------+---------+-----------+----------+--------------+ FV Mid   Full                                                        +---------+---------------+---------+-----------+----------+--------------+ FV DistalFull                                                         +---------+---------------+---------+-----------+----------+--------------+ PFV      Full                                                        +---------+---------------+---------+-----------+----------+--------------+ POP      Full           Yes      Yes                                 +---------+---------------+---------+-----------+----------+--------------+   Left Technical Findings: Not visualized segments include PTV, peroneal veins.   Summary: RIGHT: - There is no evidence of deep vein thrombosis in the lower extremity. However, portions of this examination were limited- see technologist comments above.  - No cystic structure found in the popliteal fossa.  LEFT: - There is no evidence of deep vein thrombosis in the lower extremity. However, portions of this examination were limited- see technologist comments above.  - No cystic structure found in the popliteal fossa.  *See table(s) above for measurements and observations. Electronically signed by Monica Martinez MD on 12/26/2020 at 6:56:07 PM.    Final      All questions were answered. The patient knows to call the clinic with any problems, questions or concerns. Labs from yesterday shows white count of 13,300, hemoglobin of 8.4 and platelet count of 334,000. Spent 20 minutes or more in the care of this patient including H and P, review of recommendations for  cellulitis management and follow up recommendations, discussion about going to the hospital with worsening symptoms.    Benay Pike, MD 12/29/2020 2:50 PM

## 2021-01-02 ENCOUNTER — Inpatient Hospital Stay: Payer: HMO

## 2021-01-02 ENCOUNTER — Inpatient Hospital Stay: Payer: HMO | Admitting: Hematology and Oncology

## 2021-01-02 ENCOUNTER — Other Ambulatory Visit: Payer: Self-pay

## 2021-01-02 ENCOUNTER — Encounter: Payer: Self-pay | Admitting: Hematology and Oncology

## 2021-01-02 VITALS — BP 147/53 | HR 101 | Temp 97.6°F | Resp 19 | Ht 72.0 in | Wt 199.8 lb

## 2021-01-02 DIAGNOSIS — D72829 Elevated white blood cell count, unspecified: Secondary | ICD-10-CM | POA: Diagnosis not present

## 2021-01-02 DIAGNOSIS — L03115 Cellulitis of right lower limb: Secondary | ICD-10-CM

## 2021-01-02 DIAGNOSIS — D649 Anemia, unspecified: Secondary | ICD-10-CM

## 2021-01-02 DIAGNOSIS — N184 Chronic kidney disease, stage 4 (severe): Secondary | ICD-10-CM

## 2021-01-02 DIAGNOSIS — D631 Anemia in chronic kidney disease: Secondary | ICD-10-CM

## 2021-01-02 LAB — CBC WITH DIFFERENTIAL/PLATELET
Abs Immature Granulocytes: 0.07 10*3/uL (ref 0.00–0.07)
Basophils Absolute: 0.1 10*3/uL (ref 0.0–0.1)
Basophils Relative: 1 %
Eosinophils Absolute: 0.5 10*3/uL (ref 0.0–0.5)
Eosinophils Relative: 4 %
HCT: 23.4 % — ABNORMAL LOW (ref 39.0–52.0)
Hemoglobin: 7.4 g/dL — ABNORMAL LOW (ref 13.0–17.0)
Immature Granulocytes: 1 %
Lymphocytes Relative: 6 %
Lymphs Abs: 0.8 10*3/uL (ref 0.7–4.0)
MCH: 31 pg (ref 26.0–34.0)
MCHC: 31.6 g/dL (ref 30.0–36.0)
MCV: 97.9 fL (ref 80.0–100.0)
Monocytes Absolute: 1.3 10*3/uL — ABNORMAL HIGH (ref 0.1–1.0)
Monocytes Relative: 9 %
Neutro Abs: 11.4 10*3/uL — ABNORMAL HIGH (ref 1.7–7.7)
Neutrophils Relative %: 79 %
Platelets: 342 10*3/uL (ref 150–400)
RBC: 2.39 MIL/uL — ABNORMAL LOW (ref 4.22–5.81)
RDW: 15.9 % — ABNORMAL HIGH (ref 11.5–15.5)
WBC: 14.1 10*3/uL — ABNORMAL HIGH (ref 4.0–10.5)
nRBC: 0 % (ref 0.0–0.2)

## 2021-01-02 LAB — SAMPLE TO BLOOD BANK

## 2021-01-02 LAB — CMP (CANCER CENTER ONLY)
ALT: 24 U/L (ref 0–44)
AST: 31 U/L (ref 15–41)
Albumin: 3.2 g/dL — ABNORMAL LOW (ref 3.5–5.0)
Alkaline Phosphatase: 102 U/L (ref 38–126)
Anion gap: 11 (ref 5–15)
BUN: 92 mg/dL — ABNORMAL HIGH (ref 8–23)
CO2: 22 mmol/L (ref 22–32)
Calcium: 9.1 mg/dL (ref 8.9–10.3)
Chloride: 107 mmol/L (ref 98–111)
Creatinine: 2.67 mg/dL — ABNORMAL HIGH (ref 0.61–1.24)
GFR, Estimated: 22 mL/min — ABNORMAL LOW (ref >60)
Glucose, Bld: 173 mg/dL — ABNORMAL HIGH (ref 70–99)
Potassium: 4.4 mmol/L (ref 3.5–5.1)
Sodium: 140 mmol/L (ref 135–145)
Total Bilirubin: <0.2 mg/dL — ABNORMAL LOW (ref 0.3–1.2)
Total Protein: 7 g/dL (ref 6.5–8.1)

## 2021-01-02 MED ORDER — CLINDAMYCIN HCL 300 MG PO CAPS: 300.0000 mg | ORAL_CAPSULE | Freq: Three times a day (TID) | ORAL | 0 refills | Status: AC

## 2021-01-02 NOTE — Progress Notes (Signed)
[place] NOTE  Patient Care Team: Lujean Amel, MD as PCP - General (Family Medicine) Leonie Man, MD as PCP - Cardiology (Cardiology) Elayne Snare, MD as Attending Physician (Endocrinology)  CHIEF COMPLAINTS/PURPOSE OF CONSULTATION:   Bilateral leg swelling  ASSESSMENT & PLAN:   This is a very pleasant 87 year old male patient who was seen previously for management of anemia who returns here for unplanned follow up given BLE swelling. He was seen on Thursday with slight improvement in his right lower extremity cellulitis.  There was no evidence of DVT on bilateral lower extremity ultrasound.  He is here for short interval follow-up since he continues to have pain in his bilateral lower extremity and he appears to be swollen.  He arrived with his other daughter today to the appointment.  He continues to complain of some pain however appearance wise, cellulitis continues to be improving.  I recommended that he proceed with 3 more days of antibiotics and discontinue.  He should continue using Ace wraps and elevation of legs for bilateral lower extremity swelling.  He should also try increasing his Lasix dose to 2 in the morning and 2 in the afternoon (is currently on 2 in the morning and 1 in the afternoon, given his chronic kidney disease, he may need a slightly higher dose of Lasix) he will return to clinic for follow-up in 1 week.  We will repeat his CMP at that time.  Daughter understands that it is a fine balance to monitor the CKD and use Lasix concomitantly for lower extremity swelling.  If this still does not lower his lower extremity swelling, I do not recommend any further antibiotics at this time, I recommended that he seek follow-up with his cardiologist for further recommendations since there is no active DVT.  Hemoglobin 7.4 today, we usually recommend transfusion to maintain hemoglobin of 7 or greater in him since there is no evidence of systemic  infection. Leukocytosis, unchanged, he has had chronic leukocytosis which is predominantly neutrophilia and monocytosis.  No overt difference compared to labs from several months ago.  Thank you for consulting [place] in the care of this patient.  Please not hesitate to contact us with any additional questions or concerns.  HISTORY OF PRESENTING ILLNESS:  Clayton [name] 87 y.o. male is here because of ongoing anemia.  [name] is a 87 yr old male patient with PMH significant for BPH, CKD, COPD, DM, HTN referred to hematology for evaluation of anemia.    Interval History  He is here with his daughter for follow-up.  He has been tolerating clindamycin well, no fevers no chills no diarrhea. He is taking Lasix as prescribed, continues to have some bilateral lower extremity swelling.  No other symptoms today concerning for systemic infection.  He is eating well, has some tenderness in his bilateral lower extremity. Rest of the pertinent review of systems reviewed and negative  MEDICAL HISTORY:  Past Medical History:  Diagnosis Date   Anemia    BPH (benign prostatic hyperplasia)    CAD (coronary artery disease)    hx of stemi  04-2018    Chronic renal failure    Chronic renal insufficiency    COPD (chronic obstructive pulmonary disease) (HCC)    DDD (degenerative disc disease), lumbar    Diabetes mellitus    Hyperlipidemia    Hyperparathyroidism (HCC)    Hypertension    IDA (iron deficiency anemia)    Neuromuscular disorder (HCC)    OSA  on CPAP    Osteomyelitis of forearm ([place])    Osteoporosis     SURGICAL HISTORY: Past Surgical History:  Procedure Laterality Date   CORONARY STENT INTERVENTION N/A 04/20/2018   Procedure: CORONARY STENT INTERVENTION;  Surgeon: Leonie Man, MD;  Location: [facility] INVASIVE CV LAB;;;     CORONARY/GRAFT ACUTE MI REVASCULARIZATION N/A 04/20/2018   Procedure: Coronary/Graft Acute MI Revascularization;  Surgeon: Leonie Man, MD;  Location: [place] CV  LAB;  Service: Cardiovascular;  Laterality: N/A; --> after initial PTCA restoring flow down the RCA, there was diffuse proximal to mid disease treated with a DES SYNERGY 3 X 38 ( 3.25mm)   EYE SURGERY     HOLTER MONITOR  05/2018   Sinus rhythm noted with sinus bradycardia.  Also sinus rhythm with 2-1 AV block noted.  Maximum heart rate was sinus tachycardia 123 bpm.  Rare PVCs accelerated idioventricular rhythm noted.  Intermittent Wenckebach block noted.   I & D EXTREMITY Right 08/13/2016   Procedure: IRRIGATION AND DEBRIDEMENT RIGHT ELBOW AND HAND;  Surgeon: Milly Jakob, MD;  Location: [facility] ORS;  Service: Orthopedics;  Laterality: Right;   LEFT HEART CATH AND CORONARY ANGIOGRAPHY N/A 04/20/2018   Procedure: LEFT HEART CATH AND CORONARY ANGIOGRAPHY;  Surgeon: Leonie Man, MD;  Location: [facility] CV LAB; mRCA 100%&80% (after 55% tapering in pRCA), mLM-ostLAD 40% w/ 85% ostCx, ost-pCx 65% @ Om1 w/ ost 85%. pLAD 60% & 50% after D1 with distal 75%, ostD1 65% & 90% after small side branch.     TRANSTHORACIC ECHOCARDIOGRAM  04/21/2018   EF 50-55%. Posterior Wall HK. Severe RV HK, dilated IVC. Temp wire in place   VASECTOMY     VIDEO BRONCHOSCOPY Bilateral 08/25/2012   Procedure: VIDEO BRONCHOSCOPY WITHOUT FLUORO;  Surgeon: Brand Males, MD;  Location: [facility];  Service: Cardiopulmonary;  Laterality: Bilateral;    SOCIAL HISTORY: Social History   Socioeconomic History   Marital status: Widowed    Spouse name: Not on file   Number of children: 4   Years of education: 16   Highest education level: Bachelor's degree (e.g., BA, AB, BS)  Occupational History   Occupation: retired  Tobacco Use   Smoking status: Former    Packs/day: 3.00    Years: 33.00    Pack years: 99.00    Types: Cigarettes    Quit date: 06/19/1973    Years since quitting: 47.5   Smokeless tobacco: Former    Quit date: 09/26/1975  Vaping Use   Vaping Use: Never used  Substance and Sexual Activity   Alcohol  use: No   Drug use: No   Sexual activity: Not Currently  Other Topics Concern   Not on file  Social History Narrative   He is accompanied by his daughter who seems to be quite involved with his care.      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 He also went to the [facility] several times a week until he began to have symptoms.    Social Determinants of Health   Financial Resource Strain: Not on file  Food Insecurity: Not on file  Transportation Needs: Not on file  Physical Activity: Not on file  Stress: Not on file  Social Connections: Not on file  Intimate Partner Violence: Not on file    FAMILY HISTORY: Family History  Problem Relation Age of Onset   Diabetes Mellitus II Other    Hypertension Other  Other Father        complications from strep   CVA Father    Cancer Sister    Other Brother        hip replacement complications     ALLERGIES:  is allergic to doxycycline, levaquin [levofloxacin in d5w], aliskiren, lisinopril, losartan potassium, nitrous oxide, penicillins, tape, and diltiazem.  MEDICATIONS:  Current Outpatient Medications  Medication Sig Dispense Refill   ACCU-CHEK SOFTCLIX LANCETS lancets TEST BLOOD SUGAR FOUR TIMES DAILY 400 each 1   albuterol (PROVENTIL HFA;VENTOLIN HFA) 108 (90 Base) MCG/ACT inhaler Inhale 1-2 puffs into the lungs every 6 (six) hours as needed for wheezing or shortness of breath.     Alcohol Swabs (B-D SINGLE USE SWABS REGULAR) PADS USE 7 SWABS DAILY AS DIRECTED 600 each 0   aspirin EC 81 MG tablet Take 1 tablet (81 mg total) by mouth daily. Swallow whole. 90 tablet 3   atorvastatin (LIPITOR) 40 MG tablet Take 1 tablet (40 mg total) by mouth daily. 90 tablet 3   benzonatate (TESSALON) 100 MG capsule Take by mouth 3 (three) times daily as needed for cough.     budesonide-formoterol (SYMBICORT) 160-4.5 MCG/ACT inhaler Inhale 2 puffs into the lungs 2 (two) times daily. 10.2 g 5   cholecalciferol  (VITAMIN D) 1000 units tablet Take 2,000 Units by mouth daily.     Continuous Blood Gluc Receiver (FREESTYLE LIBRE 14 DAY READER) DEVI 1 each by Does not apply route every 14 (fourteen) days. Use reader to monitor blood sugar continuously with freestyle libre sensor. 1 each 0   Continuous Blood Gluc Sensor (FREESTYLE LIBRE 14 DAY SENSOR) MISC USE AS DIRECTED EVERY 14 DAYS TO MONITOR BLOOD SUGAR CONTINUOUSLY 6 each 3   DROPLET INSULIN SYRINGE 31G X 5/16" 0.3 ML MISC USE THREE TIMES DAILY 300 each 2   finasteride (PROSCAR) 5 MG tablet TAKE 1 TABLET EVERY DAY 90 tablet 1   furosemide (LASIX) 20 MG tablet Take 1 tablet (20 mg total) by mouth 2 (two) times daily. Take seocnd dose about 12 noon each daily 180 tablet 3   glucose blood test strip Use as instructed to test blood sugars 4 times daily 150 each 12   insulin aspart (NOVOLOG) 100 UNIT/ML injection INJECT 7 UNITS SUBCUTANEOUS EVERY MORNING, 6 UNITS AT LUNCH, AND 9 UNITS AT SUPPER 30 mL 3   insulin glargine (LANTUS) 100 UNIT/ML injection Inject 0.16 mLs (16 Units total) into the skin daily. 32 mL 2   isosorbide mononitrate (IMDUR) 30 MG 24 hr tablet TAKE 1 TABLET BY MOUTH DAILY 30 tablet 8   Melatonin 5 MG TABS Take 2 tablets by mouth at bedtime.      montelukast (SINGULAIR) 10 MG tablet      Multiple Vitamin (MULTIVITAMIN WITH MINERALS) TABS tablet Take 1 tablet by mouth daily.     omeprazole (PRILOSEC) 20 MG capsule Take 1 capsule (20 mg total) by mouth daily. 30 capsule 3   oxybutynin (DITROPAN) 5 MG tablet      clindamycin (CLEOCIN) 300 MG capsule Take 1 capsule (300 mg total) by mouth 3 (three) times daily for 3 days. 9 capsule 0   nitroGLYCERIN (NITROSTAT) 0.4 MG SL tablet Place 1 tablet (0.4 mg total) under the tongue every 5 (five) minutes as needed for chest pain. 25 tablet 6   No current facility-administered medications for this visit.     PHYSICAL EXAMINATION:  ECOG PERFORMANCE STATUS: 0 - Asymptomatic  Vitals:   01/02/21 1147  BP: (!) 147/53  Pulse: (!) 101  Resp: 19  Temp: 97.6 F (36.4 C)  SpO2: 99%    Filed Weights   01/02/21 1147  Weight: 199 lb 12.8 oz (90.6 kg)    Physical Exam Constitutional:      General: He is not in acute distress.    Appearance: Normal appearance.  HENT:     Head: Normocephalic and atraumatic.  Cardiovascular:     Rate and Rhythm: Normal rate and regular rhythm.     Pulses: Normal pulses.     Heart sounds: Normal heart sounds.  Pulmonary:     Effort: Pulmonary effort is normal.     Breath sounds: No rales.  Abdominal:     General: Abdomen is flat. Bowel sounds are normal.     Palpations: Abdomen is soft.  Musculoskeletal:        General: Normal range of motion.     Right lower leg: Edema (Right lower extremity cellulitis has definitely improved, there is no overt drainage today on exam.  Left lower extremity also swollen but there is no evidence of skin changes concerning for cellulitis.) present.     Left lower leg: Edema present.  Skin:    General: Skin is warm and dry.  Neurological:     General: No focal deficit present.     Mental Status: He is alert.  Psychiatric:        Mood and Affect: Mood normal.   LABORATORY DATA:  I have reviewed the data as listed  Lab Results  Component Value Date   WBC 14.1 (H) 01/02/2021   HGB 7.4 (L) 01/02/2021   HCT 23.4 (L) 01/02/2021   MCV 97.9 01/02/2021   PLT 342 01/02/2021     Chemistry      Component Value Date/Time   NA 142 12/26/2020 1315   K 4.5 12/26/2020 1315   CL 106 12/26/2020 1315   CO2 25 12/26/2020 1315   BUN 100 (H) 12/26/2020 1315   CREATININE 2.65 (H) 12/26/2020 1315   CREATININE 2.73 (H) 12/06/2020 0926   GLU 146 03/09/2020 0000      Component Value Date/Time   CALCIUM 9.2 12/26/2020 1315   ALKPHOS 119 12/26/2020 1315   AST 30 12/26/2020 1315   AST 29 12/06/2020 0926   ALT 23 12/26/2020 1315   ALT 24 12/06/2020 0926   BILITOT 0.2 (L) 12/26/2020 1315   BILITOT 0.4 12/06/2020 0926       RADIOGRAPHIC STUDIES:  I have personally reviewed the radiological images as listed and agreed with the findings in the report.  VAS [place] LOWER EXTREMITY VENOUS (DVT)  Result Date: 12/26/2020  Lower Venous DVT Study Patient Name:  Clayton [name]  Date of Exam:   12/26/2020 Medical Rec #: [id number]        Accession #:    [id number] Date of Birth: 11/05/1927        Patient Gender: M Patient Age:   89Y Exam Location:  [facility] Procedure:      VAS [place] LOWER EXTREMITY VENOUS (DVT) Referring Phys: [id number] Fields [place] --------------------------------------------------------------------------------  Indications: Swelling, Pain, and Erythema.  Limitations: Poor ultrasound/tissue interface. Comparison Study: No prior study Performing Technologist: Maudry Mayhew MHA, RDMS, RVT, RDCS  Examination Guidelines: A complete evaluation includes B-mode imaging, spectral Doppler, color Doppler, and power Doppler as needed of all accessible portions of each vessel. Bilateral testing is considered an integral part of a complete examination. Limited examinations for reoccurring indications may be  performed as noted. The reflux portion of the exam is performed with the patient in reverse Trendelenburg.  +---------+---------------+---------+-----------+----------+--------------+ RIGHT    CompressibilityPhasicitySpontaneityPropertiesThrombus Aging +---------+---------------+---------+-----------+----------+--------------+ CFV      Full           Yes      Yes                                 +---------+---------------+---------+-----------+----------+--------------+ SFJ      Full                                                        +---------+---------------+---------+-----------+----------+--------------+ FV Prox  Full                                                        +---------+---------------+---------+-----------+----------+--------------+ FV Mid   Full                                                         +---------+---------------+---------+-----------+----------+--------------+ FV DistalFull                                                        +---------+---------------+---------+-----------+----------+--------------+ PFV      Full                                                        +---------+---------------+---------+-----------+----------+--------------+ POP      Full           Yes      Yes                                 +---------+---------------+---------+-----------+----------+--------------+ PTV                              Yes                                 +---------+---------------+---------+-----------+----------+--------------+ PERO                             Yes                                 +---------+---------------+---------+-----------+----------+--------------+   Right Technical Findings: Not visualized segments include limited visualization PTV, peroneal veins.  +---------+---------------+---------+-----------+----------+--------------+ LEFT     CompressibilityPhasicitySpontaneityPropertiesThrombus Aging +---------+---------------+---------+-----------+----------+--------------+ CFV  Full           Yes      Yes                                 +---------+---------------+---------+-----------+----------+--------------+ SFJ      Full                                                        +---------+---------------+---------+-----------+----------+--------------+ FV Prox  Full                                                        +---------+---------------+---------+-----------+----------+--------------+ FV Mid   Full                                                        +---------+---------------+---------+-----------+----------+--------------+ FV DistalFull                                                         +---------+---------------+---------+-----------+----------+--------------+ PFV      Full                                                        +---------+---------------+---------+-----------+----------+--------------+ POP      Full           Yes      Yes                                 +---------+---------------+---------+-----------+----------+--------------+   Left Technical Findings: Not visualized segments include PTV, peroneal veins.   Summary: RIGHT: - There is no evidence of deep vein thrombosis in the lower extremity. However, portions of this examination were limited- see technologist comments above.  - No cystic structure found in the popliteal fossa.  LEFT: - There is no evidence of deep vein thrombosis in the lower extremity. However, portions of this examination were limited- see technologist comments above.  - No cystic structure found in the popliteal fossa.  *See table(s) above for measurements and observations. Electronically signed by Monica Martinez MD on 12/26/2020 at 6:56:07 PM.    Final      All questions were answered. The patient knows to call the clinic with any problems, questions or concerns. Labs from today reviewed, hemoglobin of 7.4, will repeat CBC next week and transfuse him if his hemoglobin is 7 or below. We have discussed about continuing antibiotics for 3 more days, considering increasing the dose of Lasix given ongoing swelling, follow-up with cardiology for further recommendations if he continues to have bilateral lower extremity  swelling.  Daughter is in agreement and expressed understanding of the plan   Benay Pike, MD 01/02/2021 1:25 PM

## 2021-01-10 ENCOUNTER — Inpatient Hospital Stay: Payer: HMO | Admitting: Hematology and Oncology

## 2021-01-10 ENCOUNTER — Other Ambulatory Visit: Payer: Self-pay

## 2021-01-10 ENCOUNTER — Telehealth: Payer: Self-pay

## 2021-01-10 ENCOUNTER — Encounter: Payer: Self-pay | Admitting: Hematology and Oncology

## 2021-01-10 ENCOUNTER — Other Ambulatory Visit: Payer: Self-pay | Admitting: Hematology and Oncology

## 2021-01-10 ENCOUNTER — Inpatient Hospital Stay: Payer: HMO

## 2021-01-10 VITALS — BP 143/57 | HR 79 | Temp 97.7°F | Resp 17 | Wt 200.3 lb

## 2021-01-10 DIAGNOSIS — L03115 Cellulitis of right lower limb: Secondary | ICD-10-CM

## 2021-01-10 DIAGNOSIS — D631 Anemia in chronic kidney disease: Secondary | ICD-10-CM

## 2021-01-10 DIAGNOSIS — D649 Anemia, unspecified: Secondary | ICD-10-CM

## 2021-01-10 DIAGNOSIS — N184 Chronic kidney disease, stage 4 (severe): Secondary | ICD-10-CM

## 2021-01-10 DIAGNOSIS — E611 Iron deficiency: Secondary | ICD-10-CM

## 2021-01-10 LAB — CBC WITH DIFFERENTIAL (CANCER CENTER ONLY)
Abs Immature Granulocytes: 0.04 10*3/uL (ref 0.00–0.07)
Basophils Absolute: 0.1 10*3/uL (ref 0.0–0.1)
Basophils Relative: 1 %
Eosinophils Absolute: 0.6 10*3/uL — ABNORMAL HIGH (ref 0.0–0.5)
Eosinophils Relative: 6 %
HCT: 21.7 % — ABNORMAL LOW (ref 39.0–52.0)
Hemoglobin: 6.8 g/dL — CL (ref 13.0–17.0)
Immature Granulocytes: 0 %
Lymphocytes Relative: 9 %
Lymphs Abs: 0.9 10*3/uL (ref 0.7–4.0)
MCH: 30.5 pg (ref 26.0–34.0)
MCHC: 31.3 g/dL (ref 30.0–36.0)
MCV: 97.3 fL (ref 80.0–100.0)
Monocytes Absolute: 1.2 10*3/uL — ABNORMAL HIGH (ref 0.1–1.0)
Monocytes Relative: 12 %
Neutro Abs: 7.1 10*3/uL (ref 1.7–7.7)
Neutrophils Relative %: 72 %
Platelet Count: 362 10*3/uL (ref 150–400)
RBC: 2.23 MIL/uL — ABNORMAL LOW (ref 4.22–5.81)
RDW: 15.2 % (ref 11.5–15.5)
WBC Count: 10 10*3/uL (ref 4.0–10.5)
nRBC: 0 % (ref 0.0–0.2)

## 2021-01-10 LAB — SAMPLE TO BLOOD BANK

## 2021-01-10 LAB — COMPREHENSIVE METABOLIC PANEL
ALT: 19 U/L (ref 0–44)
AST: 29 U/L (ref 15–41)
Albumin: 3.1 g/dL — ABNORMAL LOW (ref 3.5–5.0)
Alkaline Phosphatase: 107 U/L (ref 38–126)
Anion gap: 10 (ref 5–15)
BUN: 106 mg/dL — ABNORMAL HIGH (ref 8–23)
CO2: 24 mmol/L (ref 22–32)
Calcium: 9.1 mg/dL (ref 8.9–10.3)
Chloride: 109 mmol/L (ref 98–111)
Creatinine, Ser: 2.87 mg/dL — ABNORMAL HIGH (ref 0.61–1.24)
GFR, Estimated: 20 mL/min — ABNORMAL LOW (ref >60)
Glucose, Bld: 152 mg/dL — ABNORMAL HIGH (ref 70–99)
Potassium: 4.3 mmol/L (ref 3.5–5.1)
Sodium: 143 mmol/L (ref 135–145)
Total Bilirubin: <0.2 mg/dL — ABNORMAL LOW (ref 0.3–1.2)
Total Protein: 6.8 g/dL (ref 6.5–8.1)

## 2021-01-10 LAB — RETICULOCYTES
Immature Retic Fract: 16.8 % — ABNORMAL HIGH (ref 2.3–15.9)
RBC.: 2.2 MIL/uL — ABNORMAL LOW (ref 4.22–5.81)
Retic Count, Absolute: 35.2 10*3/uL (ref 19.0–186.0)
Retic Ct Pct: 1.6 % (ref 0.4–3.1)

## 2021-01-10 LAB — PREPARE RBC (CROSSMATCH)

## 2021-01-10 LAB — LACTATE DEHYDROGENASE: LDH: 191 U/L (ref 98–192)

## 2021-01-10 NOTE — Telephone Encounter (Signed)
Dr. Chryl Heck aware of patient's low hemoglobin today. Received orders for patient to receive one unit of RBC.  Scheduling message sent to have patient scheduled.

## 2021-01-10 NOTE — Progress Notes (Signed)
[facility] CONSULT NOTE  Patient Care Team: Clayton Amel, MD as PCP - General (Family Medicine) Clayton Man, MD as PCP - Cardiology (Cardiology) Clayton Snare, MD as Attending Physician (Endocrinology)  CHIEF COMPLAINTS/PURPOSE OF CONSULTATION:   Bilateral leg swelling  ASSESSMENT & PLAN:   This is a very pleasant 87 year old male patient who is here with his daughter for severe anemia and cellulitis. Hemoglobin of 6.8, will arrange for a unit of packed red blood cell transfusion.  On an average he has been needing blood at least once or twice a month.  Family does not want him to proceed with bone marrow aspiration and biopsy or any other aggressive measures.  They are willing to continue transfusions as long as and meaningful benefit. We will arrange for 1 unit of packed red blood cell transfusion.  With regards to his cellulitis, he completed course of antibiotics, cellulitis appears to be significantly improved.  He continues to have right lower extremity swelling, negative for DVT week or 2 ago.  Encouraged use of Ace wraps and monitoring with cardiology.  He has chronic kidney disease, currently is taking furosemide 2 tablets in the morning and 2 tablets in the afternoon.  No worsening CKD noted on labs today  He will continue FU with cardiology. RTC in 4 weeks with repeat labs.  HISTORY OF PRESENTING ILLNESS:  Clayton [name] 87 y.o. male is here because of ongoing anemia.  Clayton [name] is a 86 yr old male patient with PMH significant for BPH, CKD, COPD, DM, HTN referred to hematology for evaluation of anemia.    Interval History  Clayton [name] is here for a follow-up with his daughter.   Since last visit, he continues to feel pretty much the same.  He is not very active, walks around in the house.  He complains of pain in the lower extremities.  He has been using Lasix as instructed and he now has to wear diapers because of urinary incontinence.  No fevers or chills.   He cannot tell when he needs a blood transfusion. No change in bowel habits, appetite is great. Rest of the pertinent review of systems reviewed and negative.   MEDICAL HISTORY:  Past Medical History:  Diagnosis Date   Anemia    BPH (benign prostatic hyperplasia)    CAD (coronary artery disease)    hx of stemi  04-2018    Chronic renal failure    Chronic renal insufficiency    COPD (chronic obstructive pulmonary disease) (HCC)    DDD (degenerative disc disease), lumbar    Diabetes mellitus    Hyperlipidemia    Hyperparathyroidism ([facility])    Hypertension    IDA (iron deficiency anemia)    Neuromuscular disorder (HCC)    OSA on CPAP    Osteomyelitis of forearm ([facility])    Osteoporosis     SURGICAL HISTORY: Past Surgical History:  Procedure Laterality Date   CORONARY STENT INTERVENTION N/A 04/20/2018   Procedure: CORONARY STENT INTERVENTION;  Surgeon: Clayton Man, MD;  Location: [facility] INVASIVE CV LAB;;;     CORONARY/GRAFT ACUTE MI REVASCULARIZATION N/A 04/20/2018   Procedure: Coronary/Graft Acute MI Revascularization;  Surgeon: Clayton Man, MD;  Location: [facility] [place] CV LAB;  Service: Cardiovascular;  Laterality: N/A; --> after initial PTCA restoring flow down the RCA, there was diffuse proximal to mid disease treated with a DES SYNERGY 3 X 38 ( 3.50mm)   EYE SURGERY     HOLTER MONITOR  05/2018  Sinus rhythm noted with sinus bradycardia.  Also sinus rhythm with 2-1 AV block noted.  Maximum heart rate was sinus tachycardia 123 bpm.  Rare PVCs accelerated idioventricular rhythm noted.  Intermittent Wenckebach block noted.   I & D EXTREMITY Right 08/13/2016   Procedure: IRRIGATION AND DEBRIDEMENT RIGHT ELBOW AND HAND;  Surgeon: Milly Jakob, MD;  Location: [facility];  Service: Orthopedics;  Laterality: Right;   LEFT HEART CATH AND CORONARY ANGIOGRAPHY N/A 04/20/2018   Procedure: LEFT HEART CATH AND CORONARY ANGIOGRAPHY;  Surgeon: Clayton Man, MD;  Location: [facility] CV LAB;  mRCA 100%&80% (after 55% tapering in pRCA), mLM-ostLAD 40% w/ 85% ostCx, ost-pCx 65% @ Om1 w/ ost 85%. pLAD 60% & 50% after D1 with distal 75%, ostD1 65% & 90% after small side branch.     TRANSTHORACIC ECHOCARDIOGRAM  04/21/2018   EF 50-55%. Posterior Wall HK. Severe RV HK, dilated IVC. Temp wire in place   VASECTOMY     VIDEO BRONCHOSCOPY Bilateral 08/25/2012   Procedure: VIDEO BRONCHOSCOPY WITHOUT FLUORO;  Surgeon: Brand Males, MD;  Location: [facility];  Service: Cardiopulmonary;  Laterality: Bilateral;    SOCIAL HISTORY: Social History   Socioeconomic History   Marital status: Widowed    Spouse name: Not on file   Number of children: 4   Years of education: 16   Highest education level: Bachelor's degree (e.g., BA, AB, BS)  Occupational History   Occupation: retired  Tobacco Use   Smoking status: Former    Packs/day: 3.00    Years: 33.00    Pack years: 99.00    Types: Cigarettes    Quit date: 06/19/1973    Years since quitting: 47.5   Smokeless tobacco: Former    Quit date: 09/26/1975  Vaping Use   Vaping Use: Never used  Substance and Sexual Activity   Alcohol use: No   Drug use: No   Sexual activity: Not Currently  Other Topics Concern   Not on file  Social History Narrative   He is accompanied by his daughter who seems to be quite involved with his care.      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 He also went to the [facility] several times a week until he began to have symptoms.    Social Determinants of Health   Financial Resource Strain: Not on file  Food Insecurity: Not on file  Transportation Needs: Not on file  Physical Activity: Not on file  Stress: Not on file  Social Connections: Not on file  Intimate Partner Violence: Not on file    FAMILY HISTORY: Family History  Problem Relation Age of Onset   Diabetes Mellitus II Other    Hypertension Other    Other Father        complications from strep   CVA  Father    Cancer Sister    Other Brother        hip replacement complications     ALLERGIES:  is allergic to doxycycline, levaquin [levofloxacin in d5w], aliskiren, lisinopril, losartan potassium, nitrous oxide, penicillins, tape, and diltiazem.  MEDICATIONS:  Current Outpatient Medications  Medication Sig Dispense Refill   ACCU-CHEK SOFTCLIX LANCETS lancets TEST BLOOD SUGAR FOUR TIMES DAILY 400 each 1   albuterol (PROVENTIL HFA;VENTOLIN HFA) 108 (90 Base) MCG/ACT inhaler Inhale 1-2 puffs into the lungs every 6 (six) hours as needed for wheezing or shortness of breath.     Alcohol Swabs (B-D SINGLE USE  SWABS REGULAR) PADS USE 7 SWABS DAILY AS DIRECTED 600 each 0   aspirin EC 81 MG tablet Take 1 tablet (81 mg total) by mouth daily. Swallow whole. 90 tablet 3   atorvastatin (LIPITOR) 40 MG tablet Take 1 tablet (40 mg total) by mouth daily. 90 tablet 3   benzonatate (TESSALON) 100 MG capsule Take by mouth 3 (three) times daily as needed for cough.     budesonide-formoterol (SYMBICORT) 160-4.5 MCG/ACT inhaler Inhale 2 puffs into the lungs 2 (two) times daily. 10.2 g 5   cholecalciferol (VITAMIN D) 1000 units tablet Take 2,000 Units by mouth daily.     Continuous Blood Gluc Receiver (FREESTYLE LIBRE 14 DAY READER) DEVI 1 each by Does not apply route every 14 (fourteen) days. Use reader to monitor blood sugar continuously with freestyle libre sensor. 1 each 0   Continuous Blood Gluc Sensor (FREESTYLE LIBRE 14 DAY SENSOR) MISC USE AS DIRECTED EVERY 14 DAYS TO MONITOR BLOOD SUGAR CONTINUOUSLY 6 each 3   DROPLET INSULIN SYRINGE 31G X 5/16" 0.3 ML MISC USE THREE TIMES DAILY 300 each 2   finasteride (PROSCAR) 5 MG tablet TAKE 1 TABLET EVERY DAY 90 tablet 1   furosemide (LASIX) 20 MG tablet Take 1 tablet (20 mg total) by mouth 2 (two) times daily. Take seocnd dose about 12 noon each daily 180 tablet 3   glucose blood test strip Use as instructed to test blood sugars 4 times daily 150 each 12   insulin  aspart (NOVOLOG) 100 UNIT/ML injection INJECT 7 UNITS SUBCUTANEOUS EVERY MORNING, 6 UNITS AT LUNCH, AND 9 UNITS AT SUPPER 30 mL 3   insulin glargine (LANTUS) 100 UNIT/ML injection Inject 0.16 mLs (16 Units total) into the skin daily. 32 mL 2   isosorbide mononitrate (IMDUR) 30 MG 24 hr tablet TAKE 1 TABLET BY MOUTH DAILY 30 tablet 8   Melatonin 5 MG TABS Take 2 tablets by mouth at bedtime.      montelukast (SINGULAIR) 10 MG tablet      Multiple Vitamin (MULTIVITAMIN WITH MINERALS) TABS tablet Take 1 tablet by mouth daily.     nitroGLYCERIN (NITROSTAT) 0.4 MG SL tablet Place 1 tablet (0.4 mg total) under the tongue every 5 (five) minutes as needed for chest pain. 25 tablet 6   omeprazole (PRILOSEC) 20 MG capsule Take 1 capsule (20 mg total) by mouth daily. 30 capsule 3   oxybutynin (DITROPAN) 5 MG tablet      No current facility-administered medications for this visit.     PHYSICAL EXAMINATION:  ECOG PERFORMANCE STATUS: 0 - Asymptomatic  Vitals:   01/10/21 0949  BP: (!) 143/57  Pulse: 79  Resp: 17  Temp: 97.7 F (36.5 C)  SpO2: 100%    Filed Weights   01/10/21 0949  Weight: 200 lb 4.8 oz (90.9 kg)    Physical Exam Constitutional:      Appearance: Normal appearance.     Comments: Pale appearance.  HENT:     Head: Normocephalic and atraumatic.  Cardiovascular:     Rate and Rhythm: Normal rate and regular rhythm.  Pulmonary:     Effort: Pulmonary effort is normal.     Breath sounds: Rales (Bibasilar) present.  Abdominal:     General: Abdomen is flat. Bowel sounds are normal.     Palpations: Abdomen is soft.  Musculoskeletal:     Cervical back: Normal range of motion and neck supple.     Right lower leg: Edema present.  Left lower leg: Edema present.  Skin:    Coloration: Skin is pale.  Neurological:     Mental Status: He is alert.  Psychiatric:        Mood and Affect: Mood normal.   LABORATORY DATA:  I have reviewed the data as listed  Lab Results   Component Value Date   WBC 10.0 01/10/2021   HGB 6.8 (LL) 01/10/2021   HCT 21.7 (L) 01/10/2021   MCV 97.3 01/10/2021   PLT 362 01/10/2021     Chemistry      Component Value Date/Time   NA 140 01/02/2021 1257   K 4.4 01/02/2021 1257   CL 107 01/02/2021 1257   CO2 22 01/02/2021 1257   BUN 92 (H) 01/02/2021 1257   CREATININE 2.67 (H) 01/02/2021 1257   GLU 146 03/09/2020 0000      Component Value Date/Time   CALCIUM 9.1 01/02/2021 1257   ALKPHOS 102 01/02/2021 1257   AST 31 01/02/2021 1257   ALT 24 01/02/2021 1257   BILITOT <0.2 (L) 01/02/2021 1257      RADIOGRAPHIC STUDIES:  I have personally reviewed the radiological images as listed and agreed with the findings in the report.  VAS [place] LOWER EXTREMITY VENOUS (DVT)  Result Date: 12/26/2020  Lower Venous DVT Study Patient Name:  Clayton [name]  Date of Exam:   12/26/2020 Medical Rec #: [id number]        Accession #:    [id number] Date of Birth: December 31, 1927        Patient Gender: M Patient Age:   89Y Exam Location:  [facility] Procedure:      VAS [place] LOWER EXTREMITY VENOUS (DVT) Referring Phys: [id number] Shauntel Prest --------------------------------------------------------------------------------  Indications: Swelling, Pain, and Erythema.  Limitations: Poor ultrasound/tissue interface. Comparison Study: No prior study Performing Technologist: Maudry Mayhew MHA, RDMS, RVT, RDCS  Examination Guidelines: A complete evaluation includes B-mode imaging, spectral Doppler, color Doppler, and power Doppler as needed of all accessible portions of each vessel. Bilateral testing is considered an integral part of a complete examination. Limited examinations for reoccurring indications may be performed as noted. The reflux portion of the exam is performed with the patient in reverse Trendelenburg.  +---------+---------------+---------+-----------+----------+--------------+ RIGHT     CompressibilityPhasicitySpontaneityPropertiesThrombus Aging +---------+---------------+---------+-----------+----------+--------------+ CFV      Full           Yes      Yes                                 +---------+---------------+---------+-----------+----------+--------------+ SFJ      Full                                                        +---------+---------------+---------+-----------+----------+--------------+ FV Prox  Full                                                        +---------+---------------+---------+-----------+----------+--------------+ FV Mid   Full                                                        +---------+---------------+---------+-----------+----------+--------------+  FV DistalFull                                                        +---------+---------------+---------+-----------+----------+--------------+ PFV      Full                                                        +---------+---------------+---------+-----------+----------+--------------+ POP      Full           Yes      Yes                                 +---------+---------------+---------+-----------+----------+--------------+ PTV                              Yes                                 +---------+---------------+---------+-----------+----------+--------------+ PERO                             Yes                                 +---------+---------------+---------+-----------+----------+--------------+   Right Technical Findings: Not visualized segments include limited visualization PTV, peroneal veins.  +---------+---------------+---------+-----------+----------+--------------+ LEFT     CompressibilityPhasicitySpontaneityPropertiesThrombus Aging +---------+---------------+---------+-----------+----------+--------------+ CFV      Full           Yes      Yes                                  +---------+---------------+---------+-----------+----------+--------------+ SFJ      Full                                                        +---------+---------------+---------+-----------+----------+--------------+ FV Prox  Full                                                        +---------+---------------+---------+-----------+----------+--------------+ FV Mid   Full                                                        +---------+---------------+---------+-----------+----------+--------------+ FV DistalFull                                                        +---------+---------------+---------+-----------+----------+--------------+  PFV      Full                                                        +---------+---------------+---------+-----------+----------+--------------+ POP      Full           Yes      Yes                                 +---------+---------------+---------+-----------+----------+--------------+   Left Technical Findings: Not visualized segments include PTV, peroneal veins.   Summary: RIGHT: - There is no evidence of deep vein thrombosis in the lower extremity. However, portions of this examination were limited- see technologist comments above.  - No cystic structure found in the popliteal fossa.  LEFT: - There is no evidence of deep vein thrombosis in the lower extremity. However, portions of this examination were limited- see technologist comments above.  - No cystic structure found in the popliteal fossa.  *See table(s) above for measurements and observations. Electronically signed by Monica Martinez MD on 12/26/2020 at 6:56:07 PM.    Final      All questions were answered.   Benay Pike, MD 01/10/2021 11:03 AM

## 2021-01-10 NOTE — Telephone Encounter (Signed)
CRITICAL VALUE STICKER  CRITICAL VALUE: Hgb = 6.8  RECEIVER (on-site recipient of call):   DATE & TIME NOTIFIED: 01/10/21 at 10:59am  MESSENGER (representative from lab): Pam  MD NOTIFIED: Dr. Chryl Heck  TIME OF NOTIFICATION: 01/10/21 at 11:05am  RESPONSE: Notification given to Junie Panning, RN for follow-up with provider.

## 2021-01-10 NOTE — Telephone Encounter (Signed)
Spoke with patient's daughter, [name], regarding new appointment for patient to receive 1 unit of blood at [facility]. Provided daughter with updated time and location of the center. Requested that patient keep blue blood bracelet on for tomorrow's appointment. Patient's daughter confirmed appointment date and time and had no further questions at the conclusion of call.   Confirmed with [facility] staff that orders and blood will be ready at appointment time.

## 2021-01-11 ENCOUNTER — Inpatient Hospital Stay: Payer: HMO

## 2021-01-11 DIAGNOSIS — D649 Anemia, unspecified: Secondary | ICD-10-CM

## 2021-01-11 DIAGNOSIS — E611 Iron deficiency: Secondary | ICD-10-CM

## 2021-01-11 DIAGNOSIS — D631 Anemia in chronic kidney disease: Secondary | ICD-10-CM

## 2021-01-11 DIAGNOSIS — N184 Chronic kidney disease, stage 4 (severe): Secondary | ICD-10-CM

## 2021-01-11 MED ORDER — SODIUM CHLORIDE 0.9% IV SOLUTION
250.0000 mL | Freq: Once | INTRAVENOUS | Status: AC
  Administered 2021-01-11: 250 mL via INTRAVENOUS
  Filled 2021-01-11: qty 250

## 2021-01-11 MED ORDER — ACETAMINOPHEN 325 MG PO TABS
650.0000 mg | ORAL_TABLET | Freq: Once | ORAL | Status: AC
  Administered 2021-01-11: 650 mg via ORAL
  Filled 2021-01-11: qty 2

## 2021-01-11 NOTE — Patient Instructions (Signed)
Blood Transfusion, Adult, Care After This sheet gives you information about how to care for yourself after your procedure. Your doctor may also give you more specific instructions. If youhave problems or questions, contact your doctor. What can I expect after the procedure? After the procedure, it is common to have: Bruising and soreness at the IV site. A fever or chills on the day of the procedure. This may be your body's response to the new blood cells received. A headache. Follow these instructions at home: Insertion site care     Follow instructions from your doctor about how to take care of your insertion site. This is where an IV tube was put into your vein. Make sure you: Wash your hands with soap and water before and after you change your bandage (dressing). If you cannot use soap and water, use hand sanitizer. Change your bandage as told by your doctor. Check your insertion site every day for signs of infection. Check for: Redness, swelling, or pain. Bleeding from the site. Warmth. Pus or a bad smell. General instructions Take over-the-counter and prescription medicines only as told by your doctor. Rest as told by your doctor. Go back to your normal activities as told by your doctor. Keep all follow-up visits as told by your doctor. This is important. Contact a doctor if: You have itching or red, swollen areas of skin (hives). You feel worried or nervous (anxious). You feel weak after doing your normal activities. You have redness, swelling, warmth, or pain around the insertion site. You have blood coming from the insertion site, and the blood does not stop with pressure. You have pus or a bad smell coming from the insertion site. Get help right away if: You have signs of a serious reaction. This may be coming from an allergy or the body's defense system (immune system). Signs include: Trouble breathing or shortness of breath. Swelling of the face or feeling warm  (flushed). Fever or chills. Head, chest, or back pain. Dark pee (urine) or blood in the pee. Widespread rash. Fast heartbeat. Feeling dizzy or light-headed. You may receive your blood transfusion in an outpatient setting. If so, youwill be told whom to contact to report any reactions. These symptoms may be an emergency. Do not wait to see if the symptoms will go away. Get medical help right away. Call your local emergency services (911 in the [place] Do not drive yourself to the hospital. Summary Bruising and soreness at the IV site are common. Check your insertion site every day for signs of infection. Rest as told by your doctor. Go back to your normal activities as told by your doctor. Get help right away if you have signs of a serious reaction. This information is not intended to replace advice given to you by your health care provider. Make sure you discuss any questions you have with your healthcare provider. Document Revised: 11/27/2018 Document Reviewed: 11/27/2018 Elsevier Patient Education  2022 [employer]  

## 2021-01-12 LAB — TYPE AND SCREEN
ABO/RH(D): B POS
Antibody Screen: NEGATIVE
Unit division: 0

## 2021-01-12 LAB — BPAM RBC
Blood Product Expiration Date: 202208192359
ISSUE DATE / TIME: 202207270906
Unit Type and Rh: 7300

## 2021-01-14 ENCOUNTER — Other Ambulatory Visit: Payer: Self-pay | Admitting: Endocrinology

## 2021-02-07 ENCOUNTER — Inpatient Hospital Stay: Payer: HMO

## 2021-02-07 ENCOUNTER — Telehealth: Payer: Self-pay | Admitting: Hematology and Oncology

## 2021-02-07 ENCOUNTER — Telehealth: Payer: Self-pay

## 2021-02-07 ENCOUNTER — Other Ambulatory Visit: Payer: Self-pay

## 2021-02-07 ENCOUNTER — Inpatient Hospital Stay: Payer: HMO | Attending: Hematology and Oncology | Admitting: Hematology and Oncology

## 2021-02-07 ENCOUNTER — Encounter: Payer: Self-pay | Admitting: Hematology and Oncology

## 2021-02-07 VITALS — BP 129/53 | HR 87 | Temp 97.8°F | Resp 18 | Ht 72.0 in | Wt 199.0 lb

## 2021-02-07 DIAGNOSIS — D649 Anemia, unspecified: Secondary | ICD-10-CM | POA: Diagnosis not present

## 2021-02-07 DIAGNOSIS — N189 Chronic kidney disease, unspecified: Secondary | ICD-10-CM | POA: Insufficient documentation

## 2021-02-07 DIAGNOSIS — D509 Iron deficiency anemia, unspecified: Secondary | ICD-10-CM | POA: Diagnosis not present

## 2021-02-07 DIAGNOSIS — D631 Anemia in chronic kidney disease: Secondary | ICD-10-CM

## 2021-02-07 DIAGNOSIS — Z79899 Other long term (current) drug therapy: Secondary | ICD-10-CM | POA: Diagnosis not present

## 2021-02-07 DIAGNOSIS — E785 Hyperlipidemia, unspecified: Secondary | ICD-10-CM | POA: Diagnosis not present

## 2021-02-07 DIAGNOSIS — E119 Type 2 diabetes mellitus without complications: Secondary | ICD-10-CM | POA: Insufficient documentation

## 2021-02-07 DIAGNOSIS — I129 Hypertensive chronic kidney disease with stage 1 through stage 4 chronic kidney disease, or unspecified chronic kidney disease: Secondary | ICD-10-CM | POA: Diagnosis not present

## 2021-02-07 DIAGNOSIS — N400 Enlarged prostate without lower urinary tract symptoms: Secondary | ICD-10-CM | POA: Insufficient documentation

## 2021-02-07 DIAGNOSIS — I2510 Atherosclerotic heart disease of native coronary artery without angina pectoris: Secondary | ICD-10-CM | POA: Diagnosis not present

## 2021-02-07 DIAGNOSIS — N184 Chronic kidney disease, stage 4 (severe): Secondary | ICD-10-CM

## 2021-02-07 DIAGNOSIS — M810 Age-related osteoporosis without current pathological fracture: Secondary | ICD-10-CM | POA: Diagnosis not present

## 2021-02-07 DIAGNOSIS — J449 Chronic obstructive pulmonary disease, unspecified: Secondary | ICD-10-CM | POA: Insufficient documentation

## 2021-02-07 LAB — SAMPLE TO BLOOD BANK

## 2021-02-07 LAB — CBC WITH DIFFERENTIAL (CANCER CENTER ONLY)
Abs Immature Granulocytes: 0.04 10*3/uL (ref 0.00–0.07)
Basophils Absolute: 0.1 10*3/uL (ref 0.0–0.1)
Basophils Relative: 1 %
Eosinophils Absolute: 0.5 10*3/uL (ref 0.0–0.5)
Eosinophils Relative: 4 %
HCT: 19.3 % — ABNORMAL LOW (ref 39.0–52.0)
Hemoglobin: 5.9 g/dL — CL (ref 13.0–17.0)
Immature Granulocytes: 0 %
Lymphocytes Relative: 9 %
Lymphs Abs: 1 10*3/uL (ref 0.7–4.0)
MCH: 28.2 pg (ref 26.0–34.0)
MCHC: 30.6 g/dL (ref 30.0–36.0)
MCV: 92.3 fL (ref 80.0–100.0)
Monocytes Absolute: 1.5 10*3/uL — ABNORMAL HIGH (ref 0.1–1.0)
Monocytes Relative: 14 %
Neutro Abs: 7.8 10*3/uL — ABNORMAL HIGH (ref 1.7–7.7)
Neutrophils Relative %: 72 %
Platelet Count: 319 10*3/uL (ref 150–400)
RBC: 2.09 MIL/uL — ABNORMAL LOW (ref 4.22–5.81)
RDW: 13.9 % (ref 11.5–15.5)
WBC Count: 10.8 10*3/uL — ABNORMAL HIGH (ref 4.0–10.5)
nRBC: 0 % (ref 0.0–0.2)

## 2021-02-07 LAB — COMPREHENSIVE METABOLIC PANEL
ALT: 19 U/L (ref 0–44)
AST: 27 U/L (ref 15–41)
Albumin: 3.3 g/dL — ABNORMAL LOW (ref 3.5–5.0)
Alkaline Phosphatase: 127 U/L — ABNORMAL HIGH (ref 38–126)
Anion gap: 11 (ref 5–15)
BUN: 100 mg/dL — ABNORMAL HIGH (ref 8–23)
CO2: 23 mmol/L (ref 22–32)
Calcium: 8.9 mg/dL (ref 8.9–10.3)
Chloride: 106 mmol/L (ref 98–111)
Creatinine, Ser: 2.88 mg/dL — ABNORMAL HIGH (ref 0.61–1.24)
GFR, Estimated: 20 mL/min — ABNORMAL LOW (ref >60)
Glucose, Bld: 127 mg/dL — ABNORMAL HIGH (ref 70–99)
Potassium: 4.5 mmol/L (ref 3.5–5.1)
Sodium: 140 mmol/L (ref 135–145)
Total Bilirubin: <0.2 mg/dL — ABNORMAL LOW (ref 0.3–1.2)
Total Protein: 6.9 g/dL (ref 6.5–8.1)

## 2021-02-07 LAB — PREPARE RBC (CROSSMATCH)

## 2021-02-07 NOTE — Progress Notes (Signed)
[facility] CONSULT NOTE  Patient Care Team: Lujean Amel, MD as PCP - General (Family Medicine) Leonie Man, MD as PCP - Cardiology (Cardiology) Elayne Snare, MD as Attending Physician (Endocrinology)  CHIEF COMPLAINTS/PURPOSE OF CONSULTATION:  Anemia, follow up  ASSESSMENT & PLAN:   This is a very pleasant 87 year old male patient who is here with his daughter for severe anemia and cellulitis. He has received iron and blood transfusion about once a month or once every 3 weeks.  Most likely his anemia is from a combination of chronic kidney disease as well as some early bone marrow issues.  However given his age, we have agreed not to proceed with any aggressive intervention measures except for periodic blood transfusion.  He does not really complain of anything today.  Daughter mentions that he is always tired, sleeps more at a time, stays up at nighttime, ongoing urinary incontinence but overall she is pleased with how his lower extremity swelling and cellulitis has improved. Physical examination today pale appearing older man in no acute distress, some bilateral lower extremity swelling noted without any cellulitis and bibasilar crackles likely from atelectasis. Have reviewed his CBC today, agree with 2 units of packed red blood cell transfusion but will try to do only 1 unit at a time for better tolerance.  He will return to clinic in 4 weeks. Thank you for consulting [place] in the care of this patient.  Please do not hesitate to contact us with any additional questions or concerns.  HISTORY OF PRESENTING ILLNESS:   Clayton [name] 87 y.o. male is here because of ongoing anemia.  Clayton [name] is a 87 yr old male patient with PMH significant for BPH, CKD, COPD, DM, HTN referred to hematology for evaluation of anemia.    Interval History  Patient is here for follow-up with his daughter.  Since last visit, he continues to have the same fatigue.  He cannot quite tell us if he  felt better after the blood transfusion.  He sleeps mostly during the daytime around 10 to 12 hours, has difficulty sleeping at night.  Ongoing urinary incontinence, continues on Lasix, wears diapers. Bilateral lower extremity redness and swelling has significantly improved since last visit, patient working on raising his legs when he is resting. No fevers or chills or change in breathing from what the daughter can report.  No dysuria. Rest of the pertinent review of systems reviewed and negative.   MEDICAL HISTORY:  Past Medical History:  Diagnosis Date   Anemia    BPH (benign prostatic hyperplasia)    CAD (coronary artery disease)    hx of stemi  04-2018    Chronic renal failure    Chronic renal insufficiency    COPD (chronic obstructive pulmonary disease) (HCC)    DDD (degenerative disc disease), lumbar    Diabetes mellitus    Hyperlipidemia    Hyperparathyroidism ([place])    Hypertension    IDA (iron deficiency anemia)    Neuromuscular disorder (HCC)    OSA on CPAP    Osteomyelitis of forearm ([place])    Osteoporosis     SURGICAL HISTORY: Past Surgical History:  Procedure Laterality Date   CORONARY STENT INTERVENTION N/A 04/20/2018   Procedure: CORONARY STENT INTERVENTION;  Surgeon: Leonie Man, MD;  Location: [facility] INVASIVE CV LAB;;;     CORONARY/GRAFT ACUTE MI REVASCULARIZATION N/A 04/20/2018   Procedure: Coronary/Graft Acute MI Revascularization;  Surgeon: Leonie Man, MD;  Location: [facility] CV LAB;  Service: Cardiovascular;  Laterality: N/A; --> after initial PTCA restoring flow down the RCA, there was diffuse proximal to mid disease treated with a DES SYNERGY 3 X 38 ( 3.68mm)   EYE SURGERY     HOLTER MONITOR  05/2018   Sinus rhythm noted with sinus bradycardia.  Also sinus rhythm with 2-1 AV block noted.  Maximum heart rate was sinus tachycardia 123 bpm.  Rare PVCs accelerated idioventricular rhythm noted.  Intermittent Wenckebach block noted.   I & D EXTREMITY Right  08/13/2016   Procedure: IRRIGATION AND DEBRIDEMENT RIGHT ELBOW AND HAND;  Surgeon: Milly Jakob, MD;  Location: [facility];  Service: Orthopedics;  Laterality: Right;   LEFT HEART CATH AND CORONARY ANGIOGRAPHY N/A 04/20/2018   Procedure: LEFT HEART CATH AND CORONARY ANGIOGRAPHY;  Surgeon: Leonie Man, MD;  Location: [facility] CV LAB; mRCA 100%&80% (after 55% tapering in pRCA), mLM-ostLAD 40% w/ 85% ostCx, ost-pCx 65% @ Om1 w/ ost 85%. pLAD 60% & 50% after D1 with distal 75%, ostD1 65% & 90% after small side branch.     TRANSTHORACIC ECHOCARDIOGRAM  04/21/2018   EF 50-55%. Posterior Wall HK. Severe RV HK, dilated IVC. Temp wire in place   VASECTOMY     VIDEO BRONCHOSCOPY Bilateral 08/25/2012   Procedure: VIDEO BRONCHOSCOPY WITHOUT FLUORO;  Surgeon: Brand Males, MD;  Location: [facility];  Service: Cardiopulmonary;  Laterality: Bilateral;    SOCIAL HISTORY: Social History   Socioeconomic History   Marital status: Widowed    Spouse name: Not on file   Number of children: 4   Years of education: 16   Highest education level: Bachelor's degree (e.g., BA, AB, BS)  Occupational History   Occupation: retired  Tobacco Use   Smoking status: Former    Packs/day: 3.00    Years: 33.00    Pack years: 99.00    Types: Cigarettes    Quit date: 06/19/1973    Years since quitting: 47.6   Smokeless tobacco: Former    Quit date: 09/26/1975  Vaping Use   Vaping Use: Never used  Substance and Sexual Activity   Alcohol use: No   Drug use: No   Sexual activity: Not Currently  Other Topics Concern   Not on file  Social History Narrative   He is accompanied by his daughter who seems to be quite involved with his care.      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 He also went to the [facility] several times a week until he began to have symptoms.    Social Determinants of Health   Financial Resource Strain: Not on file  Food Insecurity: Not on file   Transportation Needs: Not on file  Physical Activity: Not on file  Stress: Not on file  Social Connections: Not on file  Intimate Partner Violence: Not on file    FAMILY HISTORY: Family History  Problem Relation Age of Onset   Diabetes Mellitus II Other    Hypertension Other    Other Father        complications from strep   CVA Father    Cancer Sister    Other Brother        hip replacement complications     ALLERGIES:  is allergic to doxycycline, levaquin [levofloxacin in d5w], aliskiren, lisinopril, losartan potassium, nitrous oxide, penicillins, tape, and diltiazem.  MEDICATIONS:  Current Outpatient Medications  Medication Sig Dispense Refill   ACCU-CHEK SOFTCLIX LANCETS lancets TEST BLOOD  SUGAR FOUR TIMES DAILY 400 each 1   albuterol (PROVENTIL HFA;VENTOLIN HFA) 108 (90 Base) MCG/ACT inhaler Inhale 1-2 puffs into the lungs every 6 (six) hours as needed for wheezing or shortness of breath.     Alcohol Swabs (B-D SINGLE USE SWABS REGULAR) PADS USE 7 SWABS DAILY AS DIRECTED 600 each 0   aspirin EC 81 MG tablet Take 1 tablet (81 mg total) by mouth daily. Swallow whole. 90 tablet 3   atorvastatin (LIPITOR) 40 MG tablet Take 1 tablet (40 mg total) by mouth daily. 90 tablet 3   benzonatate (TESSALON) 100 MG capsule Take by mouth 3 (three) times daily as needed for cough.     budesonide-formoterol (SYMBICORT) 160-4.5 MCG/ACT inhaler Inhale 2 puffs into the lungs 2 (two) times daily. 10.2 g 5   cholecalciferol (VITAMIN D) 1000 units tablet Take 2,000 Units by mouth daily.     Continuous Blood Gluc Receiver (FREESTYLE LIBRE 14 DAY READER) DEVI 1 each by Does not apply route every 14 (fourteen) days. Use reader to monitor blood sugar continuously with freestyle libre sensor. 1 each 0   Continuous Blood Gluc Sensor (FREESTYLE LIBRE 14 DAY SENSOR) MISC USE AS DIRECTED EVERY 14 DAYS TO MONITOR BLOOD SUGAR CONTINUOUSLY 6 each 3   finasteride (PROSCAR) 5 MG tablet TAKE 1 TABLET EVERY DAY 90  tablet 1   furosemide (LASIX) 20 MG tablet Take 1 tablet (20 mg total) by mouth 2 (two) times daily. Take seocnd dose about 12 noon each daily 180 tablet 3   glucose blood test strip Use as instructed to test blood sugars 4 times daily 150 each 12   insulin aspart (NOVOLOG) 100 UNIT/ML injection INJECT 7 UNITS SUBCUTANEOUS EVERY MORNING, 6 UNITS AT LUNCH, AND 9 UNITS AT SUPPER 30 mL 3   insulin glargine (LANTUS) 100 UNIT/ML injection Inject 0.16 mLs (16 Units total) into the skin daily. 32 mL 2   isosorbide mononitrate (IMDUR) 30 MG 24 hr tablet TAKE 1 TABLET BY MOUTH DAILY 30 tablet 8   Melatonin 5 MG TABS Take 2 tablets by mouth at bedtime.      montelukast (SINGULAIR) 10 MG tablet      Multiple Vitamin (MULTIVITAMIN WITH MINERALS) TABS tablet Take 1 tablet by mouth daily.     omeprazole (PRILOSEC) 20 MG capsule Take 1 capsule (20 mg total) by mouth daily. 30 capsule 3   oxybutynin (DITROPAN) 5 MG tablet      TRUEPLUS INSULIN SYRINGE 31G X 5/16" 0.3 ML MISC USE THREE TIMES DAILY AS NEEDED 300 each 2   nitroGLYCERIN (NITROSTAT) 0.4 MG SL tablet Place 1 tablet (0.4 mg total) under the tongue every 5 (five) minutes as needed for chest pain. 25 tablet 6   No current facility-administered medications for this visit.     PHYSICAL EXAMINATION:  ECOG PERFORMANCE STATUS: 0 - Asymptomatic  Vitals:   02/07/21 0905  BP: (!) 129/53  Pulse: 87  Resp: 18  Temp: 97.8 F (36.6 C)  SpO2: 100%    Filed Weights   02/07/21 0905  Weight: 199 lb (90.3 kg)    Physical Exam Constitutional:      Appearance: Normal appearance.     Comments: Pale appearance.  HENT:     Head: Normocephalic and atraumatic.  Cardiovascular:     Rate and Rhythm: Normal rate and regular rhythm.  Pulmonary:     Effort: Pulmonary effort is normal.     Breath sounds: Rales (Bibasilar) present.  Abdominal:  General: Abdomen is flat. Bowel sounds are normal.     Palpations: Abdomen is soft.  Musculoskeletal:      Cervical back: Normal range of motion and neck supple.     Right lower leg: Edema present.     Left lower leg: Edema present.  Skin:    Coloration: Skin is pale.  Neurological:     Mental Status: He is alert.  Psychiatric:        Mood and Affect: Mood normal.   LABORATORY DATA:  I have reviewed the data as listed  Lab Results  Component Value Date   WBC 10.0 01/10/2021   HGB 6.8 (LL) 01/10/2021   HCT 21.7 (L) 01/10/2021   MCV 97.3 01/10/2021   PLT 362 01/10/2021     Chemistry      Component Value Date/Time   NA 143 01/10/2021 1041   K 4.3 01/10/2021 1041   CL 109 01/10/2021 1041   CO2 24 01/10/2021 1041   BUN 106 (H) 01/10/2021 1041   CREATININE 2.87 (H) 01/10/2021 1041   CREATININE 2.67 (H) 01/02/2021 1257   GLU 146 03/09/2020 0000      Component Value Date/Time   CALCIUM 9.1 01/10/2021 1041   ALKPHOS 107 01/10/2021 1041   AST 29 01/10/2021 1041   AST 31 01/02/2021 1257   ALT 19 01/10/2021 1041   ALT 24 01/02/2021 1257   BILITOT <0.2 (L) 01/10/2021 1041   BILITOT <0.2 (L) 01/02/2021 1257     CBC from today showed a hemoglobin of 5.9, will arrange for 2 units of packed red blood cell transfusion but on 2 successive days since he had some difficulty tolerating 2 units of blood at once back in July.  RADIOGRAPHIC STUDIES:  I have personally reviewed the radiological images as listed and agreed with the findings in the report.  No results found.   All questions were answered.   Benay Pike, MD 02/07/2021 9:17 AM

## 2021-02-07 NOTE — Telephone Encounter (Signed)
CRITICAL VALUE STICKER  CRITICAL VALUE: HGB 5.9  RECEIVER (on-site recipient of call): Delroy Ordway P. LPN  DATE & TIME NOTIFIED: 8/22 10:05 am  MESSENGER (representative from lab): Pam  MD NOTIFIED: Dr. Chryl Heck  TIME OF NOTIFICATION: 10:07 am

## 2021-02-07 NOTE — Telephone Encounter (Signed)
Scheduled per 08/23 scheduled message, called patient's daughter and patient will be notified of upcoming appointments.

## 2021-02-08 ENCOUNTER — Inpatient Hospital Stay: Payer: HMO

## 2021-02-08 VITALS — BP 116/44 | HR 61 | Temp 97.8°F | Resp 19 | Ht 72.0 in | Wt 199.2 lb

## 2021-02-08 DIAGNOSIS — D509 Iron deficiency anemia, unspecified: Secondary | ICD-10-CM | POA: Diagnosis not present

## 2021-02-08 DIAGNOSIS — D649 Anemia, unspecified: Secondary | ICD-10-CM

## 2021-02-08 DIAGNOSIS — R0989 Other specified symptoms and signs involving the circulatory and respiratory systems: Secondary | ICD-10-CM

## 2021-02-08 MED ORDER — ACETAMINOPHEN 325 MG PO TABS
ORAL_TABLET | ORAL | Status: AC
  Filled 2021-02-08: qty 2

## 2021-02-08 MED ORDER — SODIUM CHLORIDE 0.9% IV SOLUTION
250.0000 mL | Freq: Once | INTRAVENOUS | Status: AC
  Administered 2021-02-08: 250 mL via INTRAVENOUS

## 2021-02-08 MED ORDER — ACETAMINOPHEN 325 MG PO TABS
650.0000 mg | ORAL_TABLET | Freq: Once | ORAL | Status: AC
  Administered 2021-02-08: 650 mg via ORAL

## 2021-02-08 MED ORDER — FUROSEMIDE 10 MG/ML IJ SOLN
20.0000 mg | Freq: Once | INTRAMUSCULAR | Status: AC
  Administered 2021-02-08: 20 mg via INTRAVENOUS
  Filled 2021-02-08: qty 2

## 2021-02-08 NOTE — Patient Instructions (Addendum)
[facility]   Discharge Instructions: Please follow-up with your cardiologist, Dr. Glenetta Hew.  For any cough symptoms, Dr. Chryl Heck recommends using over-the-counter Mucinex as directed by the manufacturer. Thank you for choosing [facility] to provide your oncology and hematology care.   If you have a lab appointment with the [facility], please go directly to the [facility] and check in at the registration area.   Wear comfortable clothing and clothing appropriate for easy access to any Portacath or PICC line.   We strive to give you quality time with your provider. You may need to reschedule your appointment if you arrive late (15 or more minutes).  Arriving late affects you and other patients whose appointments are after yours.  Also, if you miss three or more appointments without notifying the office, you may be dismissed from the clinic at the provider's discretion.      For prescription refill requests, have your pharmacy contact our office and allow 72 hours for refills to be completed.    Today you received 2 units of packed red blood cells.    To help prevent nausea and vomiting after your treatment, we encourage you to take your nausea medication as directed.  BELOW ARE SYMPTOMS THAT SHOULD BE REPORTED IMMEDIATELY: *FEVER GREATER THAN 100.4 F (38 C) OR HIGHER *CHILLS OR SWEATING *NAUSEA AND VOMITING THAT IS NOT CONTROLLED WITH YOUR NAUSEA MEDICATION *UNUSUAL SHORTNESS OF BREATH *UNUSUAL BRUISING OR BLEEDING *URINARY PROBLEMS (pain or burning when urinating, or frequent urination) *BOWEL PROBLEMS (unusual diarrhea, constipation, pain near the anus) TENDERNESS IN MOUTH AND THROAT WITH OR WITHOUT PRESENCE OF ULCERS (sore throat, sores in mouth, or a toothache) UNUSUAL RASH, SWELLING OR PAIN  UNUSUAL VAGINAL DISCHARGE OR ITCHING   Items with * indicate a potential emergency and should be followed up as soon as possible or go to  the Emergency Department if any problems should occur.  Please show the CHEMOTHERAPY ALERT CARD or IMMUNOTHERAPY ALERT CARD at check-in to the Emergency Department and triage nurse.  Should you have questions after your visit or need to cancel or reschedule your appointment, please contact [facility]  Dept: [phone]  and follow the prompts.  Office hours are 8:00 a.m. to 4:30 p.m. Monday - Friday. Please note that voicemails left after 4:00 p.m. may not be returned until the following business day.  We are closed weekends and major holidays. You have access to a nurse at all times for urgent questions. Please call the main number to the clinic Dept: [phone] and follow the prompts.   For any non-urgent questions, you may also contact your provider using MyChart. We now offer e-Visits for anyone 38 and older to request care online for non-urgent symptoms. For details visit [email or web address].   Also download the MyChart app! Go to the app store, search "MyChart", open the app, select , and log in with your MyChart username and password.  Due to Covid, a mask is required upon entering the hospital/clinic. If you do not have a mask, one will be given to you upon arrival. For doctor visits, patients may have 1 support person aged 16 or older with them. For treatment visits, patients cannot have anyone with them due to current Covid guidelines and our immunocompromised population.

## 2021-02-09 ENCOUNTER — Encounter: Payer: Self-pay | Admitting: Cardiology

## 2021-02-09 ENCOUNTER — Other Ambulatory Visit: Payer: Self-pay

## 2021-02-09 ENCOUNTER — Ambulatory Visit: Payer: HMO | Admitting: Cardiology

## 2021-02-09 VITALS — BP 125/53 | HR 67 | Ht 72.0 in | Wt 198.2 lb

## 2021-02-09 DIAGNOSIS — I4430 Unspecified atrioventricular block: Secondary | ICD-10-CM

## 2021-02-09 DIAGNOSIS — I10 Essential (primary) hypertension: Secondary | ICD-10-CM | POA: Diagnosis not present

## 2021-02-09 DIAGNOSIS — Z955 Presence of coronary angioplasty implant and graft: Secondary | ICD-10-CM | POA: Diagnosis not present

## 2021-02-09 DIAGNOSIS — I25119 Atherosclerotic heart disease of native coronary artery with unspecified angina pectoris: Secondary | ICD-10-CM

## 2021-02-09 DIAGNOSIS — E611 Iron deficiency: Secondary | ICD-10-CM

## 2021-02-09 DIAGNOSIS — N184 Chronic kidney disease, stage 4 (severe): Secondary | ICD-10-CM

## 2021-02-09 DIAGNOSIS — E785 Hyperlipidemia, unspecified: Secondary | ICD-10-CM

## 2021-02-09 DIAGNOSIS — E1169 Type 2 diabetes mellitus with other specified complication: Secondary | ICD-10-CM

## 2021-02-09 LAB — TYPE AND SCREEN
ABO/RH(D): B POS
Antibody Screen: NEGATIVE
Unit division: 0
Unit division: 0

## 2021-02-09 LAB — BPAM RBC
Blood Product Expiration Date: 202209062359
Blood Product Expiration Date: 202209062359
ISSUE DATE / TIME: 202208240933
ISSUE DATE / TIME: 202208240933
Unit Type and Rh: 7300
Unit Type and Rh: 7300

## 2021-02-09 NOTE — Patient Instructions (Signed)
Medication Instructions:   No changes  *If you need a refill on your cardiac medications before your next appointment, please call your pharmacy*   Lab Work: Not needed     Testing/Procedures: Not needed   Follow-Up: At [facility], you and your health needs are our priority.  As part of our continuing mission to provide you with exceptional heart care, we have created designated Provider Care Teams.  These Care Teams include your primary Cardiologist (physician) and Advanced Practice Providers (APPs -  Physician Assistants and Nurse Practitioners) who all work together to provide you with the care you need, when you need it.  We recommend signing up for the patient portal called "MyChart".  Sign up information is provided on this After Visit Summary.  MyChart is used to connect with patients for Virtual Visits (Telemedicine).  Patients are able to view lab/test results, encounter notes, upcoming appointments, etc.  Non-urgent messages can be sent to your provider as well.   To learn more about what you can do with MyChart, go to [email or web address].    Your next appointment:   3 to 4 month(s)  The format for your next appointment:   In Person  Provider:   Glenetta Hew, MD

## 2021-02-09 NOTE — Progress Notes (Signed)
Primary Care Provider: Lujean Amel, MD Cardiologist: Glenetta Hew, MD Electrophysiologist: None  Clinic Note: Chief Complaint  Patient presents with   Follow-up    Monitor results   Coronary Artery Disease    No longer on Thienopyridine.  On aspirin because of bleeding.    ===================================  ASSESSMENT/PLAN   Problem List Items Addressed This Visit       Cardiology Problems   CAD, multiple vessel (Chronic)    Significant multivessel disease with residual disease in the LCx LAD and diagonal after RCA PCI.  Due to the existing left main disease, we chose to avoid treating the ostial LCx.  As he is not having any active anginal symptoms, we will continue medical management.  Not able to walk more than three quarters of a mile because of fatigue which may be more related to anemia and COPD.  Plan: Continue current dose of atorvastatin along with aspirin and no Thienopyridine-> Brilinta stopped because of bleeding. With history of bradycardia and Wenckebach block, no longer on beta-blocker. Also not on ACE inhibitor or ARB due to issues with hypotension in the past.  Only on Imdur. Continue Imdur along with furosemide.      Relevant Orders   EKG 12-Lead (Completed)   Essential hypertension - Primary (Chronic)    Blood pressure well controlled.  Actually not on treatment hypertensives.  Only on furosemide and Imdur.      AV heart block (Chronic)    Wenckebach block confirmed on monitor.  Did not seem to have higher grade block.  Continue to monitor.  Not currently on AV nodal agents.        Other   Presence of drug-eluting stent in right coronary artery (Chronic)    Unfortunately, despite having multivessel disease and extensive PCI to the RCA, we decided to stop Thienopyridine (Brilinta) due to ongoing bleeding issues.  He is now on aspirin alone.  Monitor hemoglobin levels closely.      Relevant Orders   EKG 12-Lead (Completed)   Hyperlipidemia  associated with type 2 diabetes mellitus ([place]) (Chronic)    Due for labs recheck soon  PCP should be following hemoglobin level as well as chemistries and lipids.  For now continue with reduced dose of atorvastatin.  Tolerating well.  Now on standing dose of insulin with diabetes managed by PCP.      Iron deficiency    Still intermittently having Feraheme and blood transfusions.  Hopefully this will stabilize as the Brilinta has been discontinued.  Also likely component is CKD 4.      Chronic kidney disease, stage IV (severe) (HCC) (Chronic)    Baseline creatinine roughly 3.  Making urine.  Probably more of the reasons why he is requiring additional dose of Lasix.  We will increase him to 40 mg twice daily Lasix.      ===================================  HPI:    Clayton [name] is a 87 y.o. male with a PMH notable for MV CAD (Inferior STEMI November 2019-RCA PCI) with Chronic Iron Deficiency Anemia (requiring Feraheme infusions & Blood transfusions), h/o Bradycardia w/ Wenkebach Block, CKD 4 (~Cr 3), & COPD who presents today for 3 month f/u.  JAISHON KRISHER was last seen on Nov 15, 2020 - having persistent bleeding issues. No Angina or CHF Sx.  Brilinta stopped.  Placed on aspirin 81 mg daily.  Changed atorvastatin to Monday, Wednesday, Friday and Saturday then increase to 40 mg daily. 7 d monitor ordered to recheck HR  Recent  Hospitalizations: none  Reviewed  CV studies:    The following studies were reviewed today: (if available, images/films reviewed: From Epic Chart or Care Everywhere) Event Monitor:  Predominant rhythm is sinus with a Min HR 32 bpm, Max 112 bpm, Avg HR 76 bpm.  1  AV block noted at baseline along with intermittent Wenkebach block (2  AVB -Mobitz 1 -> intermittently dropping beats after prolonging conduction, usually benign)  Occasional PVCs noted with rare couplets and triplets. Occasional bigeminy.  Very rare PACs noted.  No patient triggered  events.  Slowest heart rates noted at 5:30 AM. Fastest heart rate at 7:26 PM  Interval History:   KAYLOB WALLEN returns here today for 80-month follow-up overall doing fairly well.  Still has exertional dyspnea that is much related to his anemia and COPD is anything else.  He has had chronic edema and is increased his Lasix dose to 40 mg twice daily over the last month or so.  He has taken an additional dose off and on to try to keep the edema at [place].  He also had a relatively recent episode of left leg cellulitis.  He has not really had any PND orthopnea lower just the mild edema and progressive dyspnea.  No resting exertional chest pain or pressure.  When I last saw him, he had lost a lot of weight.  He is put some of the weight back on again and seemingly stronger.  The seems to doing pretty well from energy level taking the atorvastatin 4 days a week and 40 mg.  No real myalgias or arthralgias.  CV Review of Symptoms (Summary) Cardiovascular ROS: positive for - dyspnea on exertion, edema, shortness of breath, and some fatigue as well as decreased exercise responsiveness. negative for - chest pain, irregular heartbeat, orthopnea, palpitations, paroxysmal nocturnal dyspnea, rapid heart rate, or lightheadedness or dizziness, syncope/near syncope or TIA/MR SPECT, claudication  REVIEWED OF SYSTEMS   Review of Systems  Constitutional:  Positive for malaise/fatigue (Some exercise intolerance and fatigue related to anemia.). Negative for chills, fever and weight loss (Gained back some weight).  HENT:  Negative for congestion and nosebleeds.   Respiratory:  Positive for cough (Chronic, nonproductive) and shortness of breath (Baseline).   Cardiovascular:  Positive for leg swelling.       Per HPI  Gastrointestinal:  Negative for blood in stool and melena.  Genitourinary:  Negative for dysuria and hematuria.  Musculoskeletal:  Positive for joint pain. Negative for myalgias.  Neurological:   Positive for weakness. Negative for dizziness, focal weakness and headaches.  Psychiatric/Behavioral:  Positive for memory loss. Negative for depression. The patient is not nervous/anxious and does not have insomnia.    I have reviewed and (if needed) personally updated the patient's problem list, medications, allergies, past medical and surgical history, social and family history.   PAST MEDICAL HISTORY   Past Medical History:  Diagnosis Date   Anemia    BPH (benign prostatic hyperplasia)    CAD (coronary artery disease)    hx of stemi  04-2018    Chronic renal failure    Chronic renal insufficiency    COPD (chronic obstructive pulmonary disease) (HCC)    DDD (degenerative disc disease), lumbar    Diabetes mellitus    Hyperlipidemia    Hyperparathyroidism ([place])    Hypertension    IDA (iron deficiency anemia)    Neuromuscular disorder (HCC)    OSA on CPAP    Osteomyelitis of forearm ([place])  Osteoporosis    Wenckebach second degree AV block 11/2020   Event monitor showed [place] Amber block intermittently as well as first-degree block.  Rare occasional PACs and PVCs.  Minimum heart rate 32 bpm at early morning hours.  Maximal heart rate 112 bpm, average 76 bpm.    PAST SURGICAL HISTORY   Past Surgical History:  Procedure Laterality Date   CORONARY STENT INTERVENTION N/A 04/20/2018   Procedure: CORONARY STENT INTERVENTION;  Surgeon: Leonie Man, MD;  Location: [facility];;;     CORONARY/GRAFT ACUTE MI REVASCULARIZATION N/A 04/20/2018   Procedure: Coronary/Graft Acute MI Revascularization;  Surgeon: Leonie Man, MD;  Location: [facility];  Service: Cardiovascular;  Laterality: N/A; --> after initial PTCA restoring flow down the RCA, there was diffuse proximal to mid disease treated with a DES SYNERGY 3 X 38 ( 3.40mm)   EYE SURGERY     HOLTER MONITOR  05/2018   Sinus rhythm noted with sinus bradycardia.  Also sinus rhythm with 2-1 AV block noted.  Maximum  heart rate was sinus tachycardia 123 bpm.  Rare PVCs accelerated idioventricular rhythm noted.  Intermittent Wenckebach block noted.   I & D EXTREMITY Right 08/13/2016   Procedure: IRRIGATION AND DEBRIDEMENT RIGHT ELBOW AND HAND;  Surgeon: Milly Jakob, MD;  Location: [facility];  Service: Orthopedics;  Laterality: Right;   LEFT HEART CATH AND CORONARY ANGIOGRAPHY N/A 04/20/2018   Procedure: LEFT HEART CATH AND CORONARY ANGIOGRAPHY;  Surgeon: Leonie Man, MD;  Location: [facility]; mRCA 100%&80% (after 55% tapering in pRCA), mLM-ostLAD 40% w/ 85% ostCx, ost-pCx 65% @ Om1 w/ ost 85%. pLAD 60% & 50% after D1 with distal 75%, ostD1 65% & 90% after small side branch.     TRANSTHORACIC ECHOCARDIOGRAM  04/21/2018   EF 50-55%. Posterior Wall HK. Severe RV HK, dilated IVC. Temp wire in place   VASECTOMY     VIDEO BRONCHOSCOPY Bilateral 08/25/2012   Procedure: VIDEO BRONCHOSCOPY WITHOUT FLUORO;  Surgeon: Brand Males, MD;  Location: [facility];  Service: Cardiopulmonary;  Laterality: Bilateral;      Immunization History  Administered Date(s) Administered   Influenza Split 03/18/2012   Influenza,inj,Quad PF,6+ Mos 03/05/2013   Influenza-Unspecified 02/15/2015, 04/01/2020   PFIZER(Purple Top)SARS-COV-2 Vaccination 07/01/2019, 07/22/2019   Pneumococcal Conjugate-13 09/30/2013   Pneumococcal Polysaccharide-23 06/20/2012   Tdap 09/25/2015   Zoster Recombinat (Shingrix) 04/10/2017, 10/09/2017    MEDICATIONS/ALLERGIES   Current Meds  Medication Sig   ACCU-CHEK SOFTCLIX LANCETS lancets TEST BLOOD SUGAR FOUR TIMES DAILY   albuterol (PROVENTIL HFA;VENTOLIN HFA) 108 (90 Base) MCG/ACT inhaler Inhale 1-2 puffs into the lungs every 6 (six) hours as needed for wheezing or shortness of breath.   Alcohol Swabs (B-D SINGLE USE SWABS REGULAR) PADS USE 7 SWABS DAILY AS DIRECTED   aspirin EC 81 MG tablet Take 1 tablet (81 mg total) by mouth daily. Swallow whole.   atorvastatin (LIPITOR) 40 MG  tablet Take 1 tablet (40 mg total) by mouth daily. (Patient taking differently: Take 40 mg by mouth. Mon, Wed, Fri and Sat)   budesonide-formoterol (SYMBICORT) 160-4.5 MCG/ACT inhaler Inhale 2 puffs into the lungs 2 (two) times daily.   cholecalciferol (VITAMIN D) 1000 units tablet Take 2,000 Units by mouth daily.   Continuous Blood Gluc Receiver (FREESTYLE LIBRE 14 DAY READER) DEVI 1 each by Does not apply route every 14 (fourteen) days. Use reader to monitor blood sugar continuously with freestyle libre sensor.   Continuous Blood Gluc Sensor (  FREESTYLE LIBRE 14 DAY SENSOR) MISC USE AS DIRECTED EVERY 14 DAYS TO MONITOR BLOOD SUGAR CONTINUOUSLY   finasteride (PROSCAR) 5 MG tablet TAKE 1 TABLET EVERY DAY   furosemide (LASIX) 20 MG tablet Take 1 tablet (20 mg total) by mouth 2 (two) times daily. Take seocnd dose about 12 noon each daily   glucose blood test strip Use as instructed to test blood sugars 4 times daily   insulin aspart (NOVOLOG) 100 UNIT/ML injection INJECT 7 UNITS SUBCUTANEOUS EVERY MORNING, 6 UNITS AT LUNCH, AND 9 UNITS AT SUPPER   insulin glargine (LANTUS) 100 UNIT/ML injection Inject 0.16 mLs (16 Units total) into the skin daily.   isosorbide mononitrate (IMDUR) 30 MG 24 hr tablet TAKE 1 TABLET BY MOUTH DAILY   Melatonin 5 MG TABS Take 2 tablets by mouth at bedtime.    montelukast (SINGULAIR) 10 MG tablet    Multiple Vitamin (MULTIVITAMIN WITH MINERALS) TABS tablet Take 1 tablet by mouth daily.   omeprazole (PRILOSEC) 20 MG capsule Take 1 capsule (20 mg total) by mouth daily.   oxybutynin (DITROPAN) 5 MG tablet    TRUEPLUS INSULIN SYRINGE 31G X 5/16" 0.3 ML MISC USE THREE TIMES DAILY AS NEEDED    Allergies  Allergen Reactions   Doxycycline Rash   Levaquin [Levofloxacin In D5w]     Caused chest pain and heartburn   Aliskiren Other (See Comments)   Lisinopril Other (See Comments)   Losartan Potassium Other (See Comments)   Nitrous Oxide Other (See Comments)    Reaction:   Unknown    Penicillins Other (See Comments)    Reaction:  Unknown Has patient had a PCN reaction causing immediate rash, facial/tongue/throat swelling, SOB or lightheadedness with hypotension: Unsure Has patient had a PCN reaction causing severe rash involving mucus membranes or skin necrosis: Unsure Has patient had a PCN reaction that required hospitalization Unsure  Has patient had a PCN reaction occurring within the last 10 years: No If all of the above answers are "NO", then may proceed with Cephalosporin use.   Tape Other (See Comments)    Reaction:  Tears pts skin    Diltiazem Rash    SOCIAL HISTORY/FAMILY HISTORY   Reviewed in Epic:  Pertinent findings:  Social History   Tobacco Use   Smoking status: Former    Packs/day: 3.00    Years: 33.00    Pack years: 99.00    Types: Cigarettes    Quit date: 06/19/1973    Years since quitting: 47.7   Smokeless tobacco: Former    Quit date: 09/26/1975  Vaping Use   Vaping Use: Never used  Substance Use Topics   Alcohol use: No   Drug use: No   Social History   Social History Narrative   He is accompanied by his daughter who seems to be quite involved with his care.      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 He also went to the [facility] several times a week until he began to have symptoms.     OBJCTIVE -PE, EKG, labs   Wt Readings from Last 3 Encounters:  02/09/21 198 lb 3.2 oz (89.9 kg)  02/08/21 199 lb 3.2 oz (90.4 kg)  02/07/21 199 lb (90.3 kg)    Physical Exam: BP (!) 125/53   Pulse 67   Ht 6' (1.829 m)   Wt 198 lb 3.2 oz (89.9 kg)   SpO2 100%   BMI 26.88 kg/m  Physical Exam Constitutional:      General: He is not in acute distress.    Appearance: He is not ill-appearing or toxic-appearing.     Comments: Still somewhat scruffy, but appears healthier.  Less frail.  Has gained back some weight.  HENT:     Head: Normocephalic and atraumatic.     Ears:     Comments: Very  hard of hearing Neck:     Vascular: No carotid bruit, hepatojugular reflux or JVD.  Cardiovascular:     Rate and Rhythm: Normal rate and regular rhythm. Occasional Extrasystoles are present.    Chest Wall: PMI is not displaced.     Pulses: Decreased pulses (Decreased with palpable pedal pulses).     Heart sounds: S1 normal and S2 normal. Heart sounds are distant. No murmur heard.   No friction rub. No gallop.  Pulmonary:     Effort: Pulmonary effort is normal. No respiratory distress.     Breath sounds: Normal breath sounds. No stridor. No wheezing, rhonchi or rales.  Chest:     Chest wall: No tenderness.  Musculoskeletal:        General: Swelling (1-2+ bilateral ankle edema) present.     Cervical back: Normal range of motion and neck supple.  Skin:    General: Skin is warm and dry.     Coloration: Skin is pale.  Neurological:     General: No focal deficit present.     Mental Status: He is alert and oriented to person, place, and time.  Psychiatric:     Comments: More subdued than usual.  Not speaking.  Poor memory.  Slow response.     Adult ECG Report  Rate: 67 ;  Rhythm: normal sinus rhythm and Wenckebach block. ; Poor R wave progression otherwise normal voltage, intervals and durations.  Narrative Interpretation: Stable  Recent Labs: Reviewed Lab Results  Component Value Date   CHOL 103 07/19/2020   HDL 43.20 07/19/2020   LDLCALC 48 07/19/2020   TRIG 60.0 07/19/2020   CHOLHDL 2 07/19/2020   Lab Results  Component Value Date   CREATININE 2.88 (H) 02/07/2021   BUN 100 (H) 02/07/2021   NA 140 02/07/2021   K 4.5 02/07/2021   CL 106 02/07/2021   CO2 23 02/07/2021   CBC Latest Ref Rng & Units 02/07/2021 01/10/2021 01/02/2021  WBC 4.0 - 10.5 K/uL 10.8(H) 10.0 14.1(H)  Hemoglobin 13.0 - 17.0 g/dL 5.9(LL) 6.8(LL) 7.4(L)  Hematocrit 39.0 - 52.0 % 19.3(L) 21.7(L) 23.4(L)  Platelets 150 - 400 K/uL 319 362 342    Lab Results  Component Value Date   HGBA1C 6.0 (A)  10/18/2020   Lab Results  Component Value Date   TSH 2.82 07/19/2020    ==================================================  COVID-19 Education: The signs and symptoms of COVID-19 were discussed with the patient and how to seek care for testing (follow up with PCP or arrange E-visit).    I spent a total of 27 minutes with the patient spent in direct patient consultation.  Additional time spent with chart review  / charting (studies, outside notes, etc): 15 min Total Time: 42 min  Current medicines are reviewed at length with the patient today.  (+/- concerns) none  This visit occurred during the SARS-CoV-2 public health emergency.  Safety protocols were in place, including screening questions prior to the visit, additional usage of staff PPE, and extensive cleaning of exam room while observing appropriate contact time as indicated for disinfecting solutions.  Notice: This dictation  was prepared with Dragon dictation along with smaller phrase technology. Any transcriptional errors that result from this process are unintentional and may not be corrected upon review.  Patient Instructions / Medication Changes & Studies & Tests Ordered   Patient Instructions  Medication Instructions:   No changes  *If you need a refill on your cardiac medications before your next appointment, please call your pharmacy*   Lab Work: Not needed     Testing/Procedures: Not needed   Follow-Up: At [facility], you and your health needs are our priority.  As part of our continuing mission to provide you with exceptional heart care, we have created designated Provider Care Teams.  These Care Teams include your primary Cardiologist (physician) and Advanced Practice Providers (APPs -  Physician Assistants and Nurse Practitioners) who all work together to provide you with the care you need, when you need it.  We recommend signing up for the patient portal called "MyChart".  Sign up information is  provided on this After Visit Summary.  MyChart is used to connect with patients for Virtual Visits (Telemedicine).  Patients are able to view lab/test results, encounter notes, upcoming appointments, etc.  Non-urgent messages can be sent to your provider as well.   To learn more about what you can do with MyChart, go to [email or web address].    Your next appointment:   3 to 4 month(s)  The format for your next appointment:   In Person  Provider:   Glenetta Hew, MD     Studies Ordered:   Orders Placed This Encounter  Procedures   EKG 12-Lead      Glenetta Hew, M.D., M.S. Interventional Cardiologist   Pager # [phone] Phone # [phone] [address] [place], [place] [address]   Thank you for choosing Heartcare at [facility]!!

## 2021-02-11 ENCOUNTER — Ambulatory Visit: Payer: HMO

## 2021-02-16 ENCOUNTER — Other Ambulatory Visit: Payer: Self-pay | Admitting: Hematology and Oncology

## 2021-02-16 ENCOUNTER — Encounter: Payer: Self-pay | Admitting: Hematology and Oncology

## 2021-02-16 DIAGNOSIS — D649 Anemia, unspecified: Secondary | ICD-10-CM

## 2021-02-16 NOTE — Progress Notes (Signed)
Discussed with daughter I think its reasonable to proceed with BMB in order to determine the etiology of anemia and also to guide use of aranesp. She expressed understanding and is agreeable. Order placed.  Lanis Storlie

## 2021-02-20 ENCOUNTER — Encounter: Payer: Self-pay | Admitting: Cardiology

## 2021-02-20 NOTE — Assessment & Plan Note (Signed)
Unfortunately, despite having multivessel disease and extensive PCI to the RCA, we decided to stop Thienopyridine (Brilinta) due to ongoing bleeding issues.  He is now on aspirin alone.  Monitor hemoglobin levels closely.

## 2021-02-20 NOTE — Assessment & Plan Note (Signed)
Wenckebach block confirmed on monitor.  Did not seem to have higher grade block.  Continue to monitor.  Not currently on AV nodal agents.

## 2021-02-20 NOTE — Assessment & Plan Note (Addendum)
Still intermittently having Feraheme and blood transfusions.  Hopefully this will stabilize as the Brilinta has been discontinued.  Also likely component is CKD 4.

## 2021-02-20 NOTE — Assessment & Plan Note (Signed)
Significant multivessel disease with residual disease in the LCx LAD and diagonal after RCA PCI.  Due to the existing left main disease, we chose to avoid treating the ostial LCx.  As he is not having any active anginal symptoms, we will continue medical management.  Not able to walk more than three quarters of a mile because of fatigue which may be more related to anemia and COPD.  Plan:  Continue current dose of atorvastatin along with aspirin and no Thienopyridine-> Brilinta stopped because of bleeding.  With history of bradycardia and Wenckebach block, no longer on beta-blocker.  Also not on ACE inhibitor or ARB due to issues with hypotension in the past.  Only on Imdur.  Continue Imdur along with furosemide.

## 2021-02-20 NOTE — Assessment & Plan Note (Addendum)
Due for labs recheck soon  PCP should be following hemoglobin level as well as chemistries and lipids.  For now continue with reduced dose of atorvastatin.  Tolerating well.  Now on standing dose of insulin with diabetes managed by PCP.

## 2021-02-20 NOTE — Assessment & Plan Note (Signed)
Baseline creatinine roughly 3.  Making urine.  Probably more of the reasons why he is requiring additional dose of Lasix.  We will increase him to 40 mg twice daily Lasix.

## 2021-02-20 NOTE — Assessment & Plan Note (Signed)
Blood pressure well controlled.  Actually not on treatment hypertensives.  Only on furosemide and Imdur.

## 2021-02-21 ENCOUNTER — Other Ambulatory Visit: Payer: Self-pay

## 2021-02-21 ENCOUNTER — Encounter: Payer: Self-pay | Admitting: Endocrinology

## 2021-02-21 ENCOUNTER — Ambulatory Visit (INDEPENDENT_AMBULATORY_CARE_PROVIDER_SITE_OTHER): Payer: HMO | Admitting: Endocrinology

## 2021-02-21 VITALS — BP 132/55 | HR 72 | Ht 72.0 in | Wt 193.8 lb

## 2021-02-21 DIAGNOSIS — N184 Chronic kidney disease, stage 4 (severe): Secondary | ICD-10-CM | POA: Diagnosis not present

## 2021-02-21 DIAGNOSIS — E1165 Type 2 diabetes mellitus with hyperglycemia: Secondary | ICD-10-CM

## 2021-02-21 DIAGNOSIS — Z794 Long term (current) use of insulin: Secondary | ICD-10-CM | POA: Diagnosis not present

## 2021-02-21 LAB — POCT GLYCOSYLATED HEMOGLOBIN (HGB A1C): Hemoglobin A1C: 6.4 % — AB (ref 4.0–5.6)

## 2021-02-21 NOTE — Progress Notes (Signed)
Patient ID: Clayton [name], male   DOB: 1928-04-19, 87 y.o.   MRN: [id number]     Reason for Appointment: Followup of diabetes and various issues  History of Present Illness   Type 2 DIABETES MELITUS diagnosed 1989  He has had long-standing diabetes and has been on basal bolus insulin for the last few years Since 2014 he has required larger doses of insulin especially with having to take periodic courses of steroids for his pulmonary problems Also his Actos was stopped because of tendency to edema and metformin stopped because of renal dysfunction  Insulin regimen: Novolog 6-8-5/8 units before meals, Lantus 20 units in a.m., using syringes  A1c is about the same at 6.0, previously 6  He has had significant anemia GMI is 6.7  Current blood sugar patterns and problems identified: He has excellent control with 82% of his readings on the sensor within target  Has been usually fairly good with taking his mealtime insulin before eating what may have forgotten last night  Some of his high reading may be due to large amount of carbohydrates for which he may not adjust his NovoLog  As before he is more comfortable adjusting his NovoLog only based on Premeal blood sugar  Because of this he may get low normal or low before lunch periodically  Also once likely took too much insulin for dinner but had asymptomatic low sugar at bedtime  Because of his hearing difficulty he is not able to be alerted to low blood sugars on his sensor  However overall hypoglycemia has been minimal Overnight readings are well controlled with current regimen of Lantus taken in the morning Previously his freestyle libre was about 10-15 mg lower than the actual fingerstick reading He still is comfortable using the syringes for his insulin   Blood sugar patterns from the CGM download are interpreted as follows  Blood sugars have not been compared to the fingersticks at home for accuracy  Summary of  patterns:  Blood sugars are generally well controlled overnight with some variability, postprandial readings are fluctuating between an average of 150-160 with some variability at all times but no general significant high readings; hypoglycemia has been only occasional before lunch or bedtime OVERNIGHT readings are fairly steady with some variability but no hypoglycemia POSTPRANDIAL readings are only minimally higher than right after breakfast but may tend to be low normal or low before lunch Postprandial readings after lunch and dinner are variable with occasional spikes after those meals Hypoglycemia has occurred significantly at bedtime and lunchtime on 1 occasion each but not confirmed with an actual blood sugar reading, sometimes blood sugars may be as low as 61 before lunch  Frequency of monitoring is adequate  CGM use % of time 90  2-week average/GV 143/26  Time in range     82   %  % Time Above 180 15  % Time above 250   % Time Below 70 2+1     PRE-MEAL Fasting Lunch Dinner Bedtime Overall  Glucose range:       Averages: 138 127 143 134    POST-MEAL PC Breakfast PC Lunch PC Dinner  Glucose range:     Averages: 156 162  161   Prior data  CGM use % of time  92  2-week average/GV  153/25  Time in range  79     %  % Time Above 180  20+1  % Time above 250   %  Time Below 70 0     PRE-MEAL Fasting Lunch Dinner Bedtime Overall  Glucose range:       Averages:  128  138  125  167  152   POST-MEAL PC Breakfast PC Lunch PC Dinner  Glucose range:     Averages:  166  154  159    Previous data:  CGM use % of time  99  2-week average/GV  159/20  Time in range    78    %  % Time Above 180  22  % Time above 250   % Time Below 70 0     PRE-MEAL Fasting Lunch Dinner Bedtime Overall  Glucose range:       Averages:  153  128  161  159   POST-MEAL PC Breakfast PC Lunch PC Dinner  Glucose range:     Averages:  167  172  182     Meals: 3 meals per day.  breakfast usually  about 5 AM: oatmeal / grits, with peanut butter, for lunch he will have a sandwich with apple around 11 AM  Dinner usually at 5 PM-6 PM       Weight history:  Wt Readings from Last 3 Encounters:  02/21/21 193 lb 12.8 oz (87.9 kg)  02/09/21 198 lb 3.2 oz (89.9 kg)  02/08/21 199 lb 3.2 oz (90.4 kg)   LABS:  Lab Results  Component Value Date   HGBA1C 6.4 (A) 02/21/2021   HGBA1C 6.0 (A) 10/18/2020   HGBA1C 6.5 (A) 07/19/2020   Lab Results  Component Value Date   MICROALBUR 5.4 (H) 08/07/2018   LDLCALC 48 07/19/2020   CREATININE 2.88 (H) 02/07/2021   Problem 2:  Chronic kidney disease:  He has had chronic renal insufficiency, creatinine was 1.7 in 2014 with slow progression subsequently.  This has been related to nephropathy and glomerulosclerosis Has been evaluated and followed by nephrologist   Lab Results  Component Value Date   CREATININE 2.88 (H) 02/07/2021   CREATININE 2.87 (H) 01/10/2021   CREATININE 2.67 (H) 01/02/2021   CREATININE 2.65 (H) 12/26/2020   He has been prescribed Lasix 20 mg daily, last prescription done by his cardiologist    Allergies as of 02/21/2021       Reactions   Doxycycline Rash   Levaquin [levofloxacin In D5w]    Caused chest pain and heartburn   Aliskiren Other (See Comments)   Lisinopril Other (See Comments)   Losartan Potassium Other (See Comments)   Nitrous Oxide Other (See Comments)   Reaction:  Unknown    Penicillins Other (See Comments)   Reaction:  Unknown Has patient had a PCN reaction causing immediate rash, facial/tongue/throat swelling, SOB or lightheadedness with hypotension: Unsure Has patient had a PCN reaction causing severe rash involving mucus membranes or skin necrosis: Unsure Has patient had a PCN reaction that required hospitalization Unsure  Has patient had a PCN reaction occurring within the last 10 years: No If all of the above answers are "NO", then may proceed with Cephalosporin use.   Tape Other (See  Comments)   Reaction:  Tears pts skin    Diltiazem Rash        Medication List        Accurate as of February 21, 2021  1:25 PM. If you have any questions, ask your nurse or doctor.          Accu-Chek Softclix Lancets lancets TEST BLOOD SUGAR FOUR TIMES DAILY   albuterol  108 (90 Base) MCG/ACT inhaler Commonly known as: VENTOLIN HFA Inhale 1-2 puffs into the lungs every 6 (six) hours as needed for wheezing or shortness of breath.   aspirin EC 81 MG tablet Take 1 tablet (81 mg total) by mouth daily. Swallow whole.   atorvastatin 40 MG tablet Commonly known as: LIPITOR Take 1 tablet (40 mg total) by mouth daily. What changed:  when to take this additional instructions   B-D SINGLE USE SWABS REGULAR Pads USE 7 SWABS DAILY AS DIRECTED   benzonatate 100 MG capsule Commonly known as: TESSALON Take by mouth 3 (three) times daily as needed for cough.   budesonide-formoterol 160-4.5 MCG/ACT inhaler Commonly known as: Symbicort Inhale 2 puffs into the lungs 2 (two) times daily.   cholecalciferol 1000 units tablet Commonly known as: VITAMIN D Take 2,000 Units by mouth daily.   finasteride 5 MG tablet Commonly known as: PROSCAR TAKE 1 TABLET EVERY DAY   FreeStyle Libre 14 Day Reader Devi 1 each by Does not apply route every 14 (fourteen) days. Use reader to monitor blood sugar continuously with freestyle libre sensor.   FreeStyle Libre 14 Day Sensor Misc USE AS DIRECTED EVERY 14 DAYS TO MONITOR BLOOD SUGAR CONTINUOUSLY   furosemide 20 MG tablet Commonly known as: LASIX Take 1 tablet (20 mg total) by mouth 2 (two) times daily. Take seocnd dose about 12 noon each daily   glucose blood test strip Use as instructed to test blood sugars 4 times daily   insulin aspart 100 UNIT/ML injection Commonly known as: novoLOG INJECT 7 UNITS SUBCUTANEOUS EVERY MORNING, 6 UNITS AT LUNCH, AND 9 UNITS AT SUPPER   insulin glargine 100 UNIT/ML injection Commonly known as:  Lantus Inject 0.16 mLs (16 Units total) into the skin daily.   isosorbide mononitrate 30 MG 24 hr tablet Commonly known as: IMDUR TAKE 1 TABLET BY MOUTH DAILY   melatonin 5 MG Tabs Take 2 tablets by mouth at bedtime.   montelukast 10 MG tablet Commonly known as: SINGULAIR   multivitamin with minerals Tabs tablet Take 1 tablet by mouth daily.   nitroGLYCERIN 0.4 MG SL tablet Commonly known as: NITROSTAT Place 1 tablet (0.4 mg total) under the tongue every 5 (five) minutes as needed for chest pain.   omeprazole 20 MG capsule Commonly known as: PRILOSEC Take 1 capsule (20 mg total) by mouth daily.   oxybutynin 5 MG tablet Commonly known as: DITROPAN   TRUEplus Insulin Syringe 31G X 5/16" 0.3 ML Misc Generic drug: Insulin Syringe-Needle U-100 USE THREE TIMES DAILY AS NEEDED        Allergies:  Allergies  Allergen Reactions   Doxycycline Rash   Levaquin [Levofloxacin In D5w]     Caused chest pain and heartburn   Aliskiren Other (See Comments)   Lisinopril Other (See Comments)   Losartan Potassium Other (See Comments)   Nitrous Oxide Other (See Comments)    Reaction:  Unknown    Penicillins Other (See Comments)    Reaction:  Unknown Has patient had a PCN reaction causing immediate rash, facial/tongue/throat swelling, SOB or lightheadedness with hypotension: Unsure Has patient had a PCN reaction causing severe rash involving mucus membranes or skin necrosis: Unsure Has patient had a PCN reaction that required hospitalization Unsure  Has patient had a PCN reaction occurring within the last 10 years: No If all of the above answers are "NO", then may proceed with Cephalosporin use.   Tape Other (See Comments)    Reaction:  Tears pts  skin    Diltiazem Rash    Past Medical History:  Diagnosis Date   Anemia    BPH (benign prostatic hyperplasia)    CAD (coronary artery disease)    hx of stemi  04-2018    Chronic renal failure    Chronic renal insufficiency    COPD  (chronic obstructive pulmonary disease) (HCC)    DDD (degenerative disc disease), lumbar    Diabetes mellitus    Hyperlipidemia    Hyperparathyroidism ([place])    Hypertension    IDA (iron deficiency anemia)    Neuromuscular disorder (HCC)    OSA on CPAP    Osteomyelitis of forearm (HCC)    Osteoporosis    Wenckebach second degree AV block 11/2020   Event monitor showed Darden Amber block intermittently as well as first-degree block.  Rare occasional PACs and PVCs.  Minimum heart rate 32 bpm at early morning hours.  Maximal heart rate 112 bpm, average 76 bpm.    Past Surgical History:  Procedure Laterality Date   CORONARY STENT INTERVENTION N/A 04/20/2018   Procedure: CORONARY STENT INTERVENTION;  Surgeon: Leonie Man, MD;  Location: [facility];;;     CORONARY/GRAFT ACUTE MI REVASCULARIZATION N/A 04/20/2018   Procedure: Coronary/Graft Acute MI Revascularization;  Surgeon: Leonie Man, MD;  Location: [facility];  Service: Cardiovascular;  Laterality: N/A; --> after initial PTCA restoring flow down the RCA, there was diffuse proximal to mid disease treated with a DES SYNERGY 3 X 38 ( 3.39mm)   EYE SURGERY     HOLTER MONITOR  05/2018   Sinus rhythm noted with sinus bradycardia.  Also sinus rhythm with 2-1 AV block noted.  Maximum heart rate was sinus tachycardia 123 bpm.  Rare PVCs accelerated idioventricular rhythm noted.  Intermittent Wenckebach block noted.   I & D EXTREMITY Right 08/13/2016   Procedure: IRRIGATION AND DEBRIDEMENT RIGHT ELBOW AND HAND;  Surgeon: Milly Jakob, MD;  Location: [facility];  Service: Orthopedics;  Laterality: Right;   LEFT HEART CATH AND CORONARY ANGIOGRAPHY N/A 04/20/2018   Procedure: LEFT HEART CATH AND CORONARY ANGIOGRAPHY;  Surgeon: Leonie Man, MD;  Location: [facility]; mRCA 100%&80% (after 55% tapering in pRCA), mLM-ostLAD 40% w/ 85% ostCx, ost-pCx 65% @ Om1 w/ ost 85%. pLAD 60% & 50% after D1 with distal 75%, ostD1 65% & 90%  after small side branch.     TRANSTHORACIC ECHOCARDIOGRAM  04/21/2018   EF 50-55%. Posterior Wall HK. Severe RV HK, dilated IVC. Temp wire in place   VASECTOMY     VIDEO BRONCHOSCOPY Bilateral 08/25/2012   Procedure: VIDEO BRONCHOSCOPY WITHOUT FLUORO;  Surgeon: Brand Males, MD;  Location: [facility];  Service: Cardiopulmonary;  Laterality: Bilateral;    Family History  Problem Relation Age of Onset   Diabetes Mellitus II Other    Hypertension Other    Other Father        complications from strep   CVA Father    Cancer Sister    Other Brother        hip replacement complications     Social History:  reports that he quit smoking about 47 years ago. His smoking use included cigarettes. He has a 99.00 pack-year smoking history. He quit smokeless tobacco use about 45 years ago. He reports that he does not drink alcohol and does not use drugs.  Review of Systems  BLOOD PRESSURE:  Not on any antihypertensive treatment  BP Readings from Last 3  Encounters:  02/21/21 (!) 132/55  02/09/21 (!) 125/53  02/08/21 (!) 116/44   Taking Lasix for edema, taking 20 mg twice daily, dose adjusted by nephrologist and cardiologist   ANEMIA:   He has a  history of anemia related to renal dysfunction and mild iron deficiency  Has been treated with iron infusions and now requiring blood transfusion   Lab Results  Component Value Date   WBC 10.8 (H) 02/07/2021   HGB 5.9 (LL) 02/07/2021   HCT 19.3 (L) 02/07/2021   MCV 92.3 02/07/2021   PLT 319 02/07/2021   Lab Results  Component Value Date   IRON 56 11/22/2020   TIBC 293 11/22/2020   FERRITIN 213 11/22/2020    He has had sleep apnea, using CPAP and this is being adjusted by pulmonologist.     HYPERLIPIDEMIA:     He is on atorvastatin since his MI prescribed by cardiologist, last LDL:  Lab Results  Component Value Date   CHOL 103 07/19/2020   HDL 43.20 07/19/2020   LDLCALC 48 07/19/2020   TRIG 60.0 07/19/2020   CHOLHDL 2  07/19/2020   Lab Results  Component Value Date   ALT 19 02/07/2021   ALT 24 01/02/2021        Examination:   BP (!) 132/55   Pulse 72   Ht 6' (1.829 m)   Wt 193 lb 12.8 oz (87.9 kg)   SpO2 99%   BMI 26.28 kg/m   Body mass index is 26.28 kg/m.    Assesment/PLAN:   Diabetes type 2 with obesity treated with basal bolus insulin  See history of present illness for detailed discussion of current diabetes management, blood sugar patterns and problems identifie  A1c is excellent at 6.4 and stable considering his age  His A1c seems to correlate reasonably well with his GMI of 6.7  However he does have significant anemia   With his basal bolus insulin regimen his blood sugars are excellent with only occasional hypoglycemia and only 15% of the readings above 180 overall  However he does need to monitor his fingersticks periodically Previously freestyle libre appears to be reading about 10-15% lower than the actual fingersticks reading   He still does not want to switch to insulin pens for his mealtime dose Discussed that he needs to adjust his mealtime dose based on what he is planning to eat including carbohydrates rather than Premeal blood sugar only Also he likely needs to take only 6 units of NovoLog in the morning instead of 8 to prevent low sugars before lunch Postprandial reading targets discussed Generally postprandial readings are good with average about 150-160 No change in Lantus unless morning sugars are consistently above 140 or below 90  One Touch Verio monitor was given  CKD: Relatively stable and he will keep regular appointments with his nephrologist  Patient Instructions  Reduce am dose to 6 units novolog if sugar normal or sugar starts getting low at lunch  Check sugar with fingerstick periodically  Follow-up in 4 months   Shelle Galdamez  02/21/2021, 1:25 PM

## 2021-02-21 NOTE — Patient Instructions (Signed)
Reduce am dose to 6 units novolog if sugar normal or sugar starts getting low at lunch  Check sugar with fingerstick periodically

## 2021-02-27 ENCOUNTER — Other Ambulatory Visit (HOSPITAL_COMMUNITY): Payer: Self-pay | Admitting: Physician Assistant

## 2021-02-28 ENCOUNTER — Ambulatory Visit (HOSPITAL_COMMUNITY)
Admission: RE | Admit: 2021-02-28 | Discharge: 2021-02-28 | Disposition: A | Discharge status: Home / Self Care | Payer: HMO | Source: Ambulatory Visit | Attending: Hematology and Oncology | Admitting: Hematology and Oncology

## 2021-02-28 ENCOUNTER — Other Ambulatory Visit: Payer: Self-pay

## 2021-02-28 ENCOUNTER — Encounter (HOSPITAL_COMMUNITY): Payer: Self-pay

## 2021-02-28 DIAGNOSIS — Z79899 Other long term (current) drug therapy: Secondary | ICD-10-CM | POA: Insufficient documentation

## 2021-02-28 DIAGNOSIS — E1122 Type 2 diabetes mellitus with diabetic chronic kidney disease: Secondary | ICD-10-CM | POA: Insufficient documentation

## 2021-02-28 DIAGNOSIS — N189 Chronic kidney disease, unspecified: Secondary | ICD-10-CM | POA: Insufficient documentation

## 2021-02-28 DIAGNOSIS — Z794 Long term (current) use of insulin: Secondary | ICD-10-CM | POA: Diagnosis not present

## 2021-02-28 DIAGNOSIS — E785 Hyperlipidemia, unspecified: Secondary | ICD-10-CM | POA: Insufficient documentation

## 2021-02-28 DIAGNOSIS — I129 Hypertensive chronic kidney disease with stage 1 through stage 4 chronic kidney disease, or unspecified chronic kidney disease: Secondary | ICD-10-CM | POA: Diagnosis not present

## 2021-02-28 DIAGNOSIS — Z7982 Long term (current) use of aspirin: Secondary | ICD-10-CM | POA: Diagnosis not present

## 2021-02-28 DIAGNOSIS — E118 Type 2 diabetes mellitus with unspecified complications: Secondary | ICD-10-CM | POA: Insufficient documentation

## 2021-02-28 DIAGNOSIS — Z955 Presence of coronary angioplasty implant and graft: Secondary | ICD-10-CM | POA: Diagnosis not present

## 2021-02-28 DIAGNOSIS — D649 Anemia, unspecified: Secondary | ICD-10-CM | POA: Diagnosis present

## 2021-02-28 LAB — CBC
HCT: 21.3 % — ABNORMAL LOW (ref 39.0–52.0)
Hemoglobin: 6.3 g/dL — CL (ref 13.0–17.0)
MCH: 26.6 pg (ref 26.0–34.0)
MCHC: 29.6 g/dL — ABNORMAL LOW (ref 30.0–36.0)
MCV: 89.9 fL (ref 80.0–100.0)
Platelets: 384 10*3/uL (ref 150–400)
RBC: 2.37 MIL/uL — ABNORMAL LOW (ref 4.22–5.81)
RDW: 16 % — ABNORMAL HIGH (ref 11.5–15.5)
WBC: 9.4 10*3/uL (ref 4.0–10.5)
nRBC: 0 % (ref 0.0–0.2)

## 2021-02-28 LAB — GLUCOSE, CAPILLARY: Glucose-Capillary: 175 mg/dL — ABNORMAL HIGH (ref 70–99)

## 2021-02-28 MED ORDER — NALOXONE HCL 0.4 MG/ML IJ SOLN
INTRAMUSCULAR | Status: AC
  Filled 2021-02-28: qty 1

## 2021-02-28 MED ORDER — FLUMAZENIL 0.5 MG/5ML IV SOLN
INTRAVENOUS | Status: AC
  Filled 2021-02-28: qty 5

## 2021-02-28 MED ORDER — MIDAZOLAM HCL 2 MG/2ML IJ SOLN
INTRAMUSCULAR | Status: AC
  Filled 2021-02-28: qty 2

## 2021-02-28 MED ORDER — FENTANYL CITRATE (PF) 100 MCG/2ML IJ SOLN
INTRAMUSCULAR | Status: AC
  Filled 2021-02-28: qty 2

## 2021-02-28 MED ORDER — SODIUM CHLORIDE 0.9 % IV SOLN: INTRAVENOUS | Status: DC

## 2021-02-28 MED ORDER — LIDOCAINE HCL (PF) 1 % IJ SOLN
INTRAMUSCULAR | Status: DC | PRN
  Administered 2021-02-28: 10 mL via INTRADERMAL

## 2021-02-28 NOTE — H&P (Signed)
Chief Complaint: Patient was seen in consultation today for Bone Marrow Biopsy at the request of [facility]  Supervising Physician: Michaelle Birks  Patient Status: [place]  History of Present Illness: Clayton [name] is a 87 y.o. male with significant past medical history and ongoing anemia.  He has been experiencing persistent fatigue, cold intolerance, and increased time sleeping.    He presents NPO, has a driver home, and will be at home with his daughter for at least the next 24 hours.  Past Medical History:  Diagnosis Date   Anemia    BPH (benign prostatic hyperplasia)    CAD (coronary artery disease)    hx of stemi  04-2018    Chronic renal failure    Chronic renal insufficiency    COPD (chronic obstructive pulmonary disease) (HCC)    DDD (degenerative disc disease), lumbar    Diabetes mellitus    Hyperlipidemia    Hyperparathyroidism ([place])    Hypertension    IDA (iron deficiency anemia)    Neuromuscular disorder (HCC)    OSA on CPAP    Osteomyelitis of forearm (HCC)    Osteoporosis    Wenckebach second degree AV block 11/2020   Event monitor showed Darden Amber block intermittently as well as first-degree block.  Rare occasional PACs and PVCs.  Minimum heart rate 32 bpm at early morning hours.  Maximal heart rate 112 bpm, average 76 bpm.    Past Surgical History:  Procedure Laterality Date   CORONARY STENT INTERVENTION N/A 04/20/2018   Procedure: CORONARY STENT INTERVENTION;  Surgeon: Leonie Man, MD;  Location: [facility];;;     CORONARY/GRAFT ACUTE MI REVASCULARIZATION N/A 04/20/2018   Procedure: Coronary/Graft Acute MI Revascularization;  Surgeon: Leonie Man, MD;  Location: [facility];  Service: Cardiovascular;  Laterality: N/A; --> after initial PTCA restoring flow down the RCA, there was diffuse proximal to mid disease treated with a DES SYNERGY 3 X 38 ( 3.84m)   EYE SURGERY     HOLTER MONITOR  05/2018   Sinus rhythm noted  with sinus bradycardia.  Also sinus rhythm with 2-1 AV block noted.  Maximum heart rate was sinus tachycardia 123 bpm.  Rare PVCs accelerated idioventricular rhythm noted.  Intermittent Wenckebach block noted.   I & D EXTREMITY Right 08/13/2016   Procedure: IRRIGATION AND DEBRIDEMENT RIGHT ELBOW AND HAND;  Surgeon: DMilly Jakob MD;  Location: [facility];  Service: Orthopedics;  Laterality: Right;   LEFT HEART CATH AND CORONARY ANGIOGRAPHY N/A 04/20/2018   Procedure: LEFT HEART CATH AND CORONARY ANGIOGRAPHY;  Surgeon: HLeonie Man MD;  [facility]; mRCA 100%&80% (after 55% tapering in pRCA), mLM-ostLAD 40% w/ 85% ostCx, ost-pCx 65% @ Om1 w/ ost 85%. pLAD 60% & 50% after D1 with distal 75%, ostD1 65% & 90% after small side branch.     TRANSTHORACIC ECHOCARDIOGRAM  04/21/2018   EF 50-55%. Posterior Wall HK. Severe RV HK, dilated IVC. Temp wire in place   VASECTOMY     VIDEO BRONCHOSCOPY Bilateral 08/25/2012   Procedure: VIDEO BRONCHOSCOPY WITHOUT FLUORO;  Surgeon: MBrand Males MD;  Location: [facility]  Service: Cardiopulmonary;  Laterality: Bilateral;    Allergies: Doxycycline, Levaquin [levofloxacin in d5w], [place], Lisinopril, Losartan potassium, Nitrous oxide, Penicillins, Tape, and Diltiazem  Medications: Prior to Admission medications   Medication Sig Start Date End Date Taking? Authorizing Provider  albuterol (PROVENTIL HFA;VENTOLIN HFA) 108 (90 Base) MCG/ACT inhaler Inhale 1-2 puffs into the  lungs every 6 (six) hours as needed for wheezing or shortness of breath.   Yes [provider]  aspirin EC 81 MG tablet Take 1 tablet (81 mg total) by mouth daily. Swallow whole. 11/15/20  Yes Leonie Man, MD  atorvastatin (LIPITOR) 40 MG tablet Take 1 tablet (40 mg total) by mouth daily. Patient taking differently: Take 40 mg by mouth. Jory Sims, Fri and Sat 11/15/20 02/28/21 Yes Leonie Man, MD  cholecalciferol (VITAMIN D) 1000 units tablet Take 2,000 Units  by mouth daily.   Yes [provider]  finasteride (PROSCAR) 5 MG tablet TAKE 1 TABLET EVERY DAY 04/23/19  Yes Elayne Snare, MD  furosemide (LASIX) 20 MG tablet Take 1 tablet (20 mg total) by mouth 2 (two) times daily. Take seocnd dose about 12 noon each daily 12/14/19  Yes Leonie Man, MD  insulin aspart (NOVOLOG) 100 UNIT/ML injection INJECT 7 UNITS SUBCUTANEOUS EVERY MORNING, 6 UNITS AT LUNCH, AND 9 UNITS AT SUPPER 09/27/20  Yes Elayne Snare, MD  insulin glargine (LANTUS) 100 UNIT/ML injection Inject 0.16 mLs (16 Units total) into the skin daily. 09/21/20  Yes Elayne Snare, MD  isosorbide mononitrate (IMDUR) 30 MG 24 hr tablet TAKE 1 TABLET BY MOUTH DAILY 04/12/20  Yes Leonie Man, MD  Melatonin 5 MG TABS Take 2 tablets by mouth at bedtime.    Yes [provider]  Multiple Vitamin (MULTIVITAMIN WITH MINERALS) TABS tablet Take 1 tablet by mouth daily.   Yes [provider]  omeprazole (PRILOSEC) 20 MG capsule Take 1 capsule (20 mg total) by mouth daily. 11/17/18  Yes Doran Stabler, MD  oxybutynin (DITROPAN) 5 MG tablet  02/06/19  Yes [provider]  ACCU-CHEK SOFTCLIX LANCETS lancets TEST BLOOD SUGAR FOUR TIMES DAILY 07/28/18   Elayne Snare, MD  Alcohol Swabs (B-D SINGLE USE SWABS REGULAR) PADS USE 7 SWABS DAILY AS DIRECTED 07/15/20   Elayne Snare, MD  benzonatate (TESSALON) 100 MG capsule Take by mouth 3 (three) times daily as needed for cough.    [provider]  budesonide-formoterol (SYMBICORT) 160-4.5 MCG/ACT inhaler Inhale 2 puffs into the lungs 2 (two) times daily. 07/29/13   Tanda Rockers, MD  Continuous Blood Gluc Receiver (FREESTYLE LIBRE 14 DAY READER) DEVI 1 each by Does not apply route every 14 (fourteen) days. Use reader to monitor blood sugar continuously with freestyle libre sensor. 08/19/20   Elayne Snare, MD  Continuous Blood Gluc Sensor (FREESTYLE LIBRE 14 DAY SENSOR) MISC USE AS DIRECTED EVERY 14 DAYS TO MONITOR BLOOD SUGAR CONTINUOUSLY  12/26/20   Elayne Snare, MD  glucose blood test strip Use as instructed to test blood sugars 4 times daily 06/09/18   Elayne Snare, MD  montelukast (SINGULAIR) 10 MG tablet  10/30/19   [provider]  nitroGLYCERIN (NITROSTAT) 0.4 MG SL tablet Place 1 tablet (0.4 mg total) under the tongue every 5 (five) minutes as needed for chest pain. 05/21/19 08/19/19  Leonie Man, MD  TRUEPLUS INSULIN SYRINGE 31G X 5/16" 0.3 ML MISC USE THREE TIMES DAILY AS NEEDED 01/16/21   Elayne Snare, MD     Family History  Problem Relation Age of Onset   Diabetes Mellitus II Other    Hypertension Other    Other Father        complications from strep   CVA Father    Cancer Sister    Other Brother        hip replacement complications  Review of Systems: A 12 point ROS discussed and pertinent positives are indicated in the HPI above.  All other systems are negative.  Review of Systems  Constitutional:  Positive for activity change, appetite change and fatigue.  Respiratory:  Positive for cough. Negative for chest tightness and shortness of breath.   Cardiovascular:  Negative for chest pain, palpitations and leg swelling.  Gastrointestinal:  Negative for abdominal distention, abdominal pain, constipation, diarrhea and nausea.  Endocrine: Positive for cold intolerance.  Skin:  Positive for wound.  Neurological:  Positive for weakness.  Hematological:  Bruises/bleeds easily.  Psychiatric/Behavioral:  Negative for agitation, behavioral problems and confusion.    Vital Signs: BP (!) 143/54   Pulse 80   Temp 98.4 F (36.9 C) (Oral)   Resp 16   SpO2 99%   Physical Exam Vitals reviewed.  Constitutional:      Appearance: He is ill-appearing.  HENT:     Head: Normocephalic and atraumatic.     Mouth/Throat:     Pharynx: Oropharynx is clear. No oropharyngeal exudate.  Cardiovascular:     Rate and Rhythm: Normal rate and regular rhythm.  Pulmonary:     Effort: Pulmonary effort is normal.      Comments: Expiratory crackles throughout Abdominal:     General: Abdomen is flat. There is no distension.     Palpations: Abdomen is soft.     Tenderness: There is no abdominal tenderness. There is no guarding.  Skin:    Capillary Refill: Capillary refill takes more than 3 seconds.     Coloration: Skin is pale.     Findings: Bruising and lesion present.     Comments: Diffuse bruising on both upper extremities Wound in healing of right shin, fully scabbed over Scant pitting edema of the right lower leg   Neurological:     Mental Status: He is alert and oriented to person, place, and time.     Comments: Seems tired, effortful communication--both speech and listening    Imaging: No results found.  Labs:  CBC: Recent Labs    12/26/20 1315 01/02/21 1257 01/10/21 1041 02/07/21 0942  WBC 13.3* 14.1* 10.0 10.8*  HGB 8.4* 7.4* 6.8* 5.9*  HCT 26.5* 23.4* 21.7* 19.3*  PLT 334 342 362 319    COAGS: No results for input(s): INR, APTT in the last 8760 hours.  BMP: Recent Labs    12/26/20 1315 01/02/21 1257 01/10/21 1041 02/07/21 0942  NA 142 140 143 140  K 4.5 4.4 4.3 4.5  CL 106 107 109 106  CO2 25 22 24 23   GLUCOSE 194* 173* 152* 127*  BUN 100* 92* 106* 100*  CALCIUM 9.2 9.1 9.1 8.9  CREATININE 2.65* 2.67* 2.87* 2.88*  GFRNONAA 22* 22* 20* 20*    LIVER FUNCTION TESTS: Recent Labs    12/26/20 1315 01/02/21 1257 01/10/21 1041 02/07/21 0942  BILITOT 0.2* <0.2* <0.2* <0.2*  AST 30 31 29 27   ALT 23 24 19 19   ALKPHOS 119 102 107 127*  PROT 7.0 7.0 6.8 6.9  ALBUMIN 3.1* 3.2* 3.1* 3.3*    Assessment and Plan:  Anemia OK to proceed with planned bone marrow biopsy.  Risks and benefits of bone marrow biopsy was discussed with the patient and/or patient's family including, but not limited to bleeding, infection, damage to adjacent structures or low yield requiring additional tests.  All of the questions were answered and there is agreement to proceed.  Consent  signed and in chart.   Thank you  for this interesting consult.  I greatly enjoyed meeting DUEL CONRAD and look forward to participating in their care.  A copy of this report was sent to the requesting provider on this date.  Electronically Signed: Pasty Spillers, PA 02/28/2021, 10:35 AM   I spent a total of  30 Minutes  in face to face in clinical consultation, greater than 50% of which was counseling/coordinating care for bone marrow biopsy

## 2021-02-28 NOTE — Procedures (Signed)
Vascular and Interventional Radiology Procedure Note  Patient: Clayton [name] DOB: 11-27-1927 Medical Record Number: [id number] Note Date/Time: 02/28/21 12:10 PM   Performing Physician: Michaelle Birks, MD Assistant(s): None  Diagnosis: Malaise. Anemia.  Procedure: BONE MARROW ASPIRATION AND BIOPSY  Anesthesia: Conscious Sedation Complications: None Estimated Blood Loss: Minimal Specimens: Sent for Pathology  Findings:  Successful CT-guided bone marrow biopsy A total of 1 cores were obtained. Hemostasis of the tract was achieved using Manual Pressure.  Plan: Bed rest for 1 hours.  See detailed procedure note with images in PACS. The patient tolerated the procedure well without incident or complication and was returned to Recovery in stable condition.    Michaelle Birks, MD [facility]   Pager. [place]

## 2021-03-06 ENCOUNTER — Telehealth: Payer: Self-pay | Admitting: Endocrinology

## 2021-03-06 NOTE — Telephone Encounter (Signed)
Pt is needing a refill request for   finasteride (PROSCAR) 5 MG tablet   Pt no longer has [place], [place] [facility] [place]

## 2021-03-08 ENCOUNTER — Encounter (HOSPITAL_COMMUNITY): Payer: Self-pay | Admitting: Hematology and Oncology

## 2021-03-08 ENCOUNTER — Encounter: Payer: Self-pay | Admitting: Endocrinology

## 2021-03-09 NOTE — Telephone Encounter (Signed)
Is it ok to refill this medication?

## 2021-03-10 ENCOUNTER — Encounter (HOSPITAL_COMMUNITY): Payer: Self-pay | Admitting: Radiology

## 2021-03-10 ENCOUNTER — Emergency Department (HOSPITAL_COMMUNITY): Payer: HMO

## 2021-03-10 ENCOUNTER — Other Ambulatory Visit: Payer: Self-pay

## 2021-03-10 ENCOUNTER — Inpatient Hospital Stay (HOSPITAL_COMMUNITY)
Admission: EM | Admit: 2021-03-10 | Discharge: 2021-03-12 | DRG: 811 | Disposition: A | Discharge status: Home / Self Care | Payer: HMO | Attending: Internal Medicine | Admitting: Internal Medicine

## 2021-03-10 DIAGNOSIS — L03115 Cellulitis of right lower limb: Secondary | ICD-10-CM | POA: Diagnosis present

## 2021-03-10 DIAGNOSIS — Z7982 Long term (current) use of aspirin: Secondary | ICD-10-CM

## 2021-03-10 DIAGNOSIS — Z794 Long term (current) use of insulin: Secondary | ICD-10-CM

## 2021-03-10 DIAGNOSIS — Z833 Family history of diabetes mellitus: Secondary | ICD-10-CM

## 2021-03-10 DIAGNOSIS — J449 Chronic obstructive pulmonary disease, unspecified: Secondary | ICD-10-CM | POA: Diagnosis present

## 2021-03-10 DIAGNOSIS — N179 Acute kidney failure, unspecified: Secondary | ICD-10-CM | POA: Diagnosis present

## 2021-03-10 DIAGNOSIS — Z888 Allergy status to other drugs, medicaments and biological substances status: Secondary | ICD-10-CM

## 2021-03-10 DIAGNOSIS — I214 Non-ST elevation (NSTEMI) myocardial infarction: Secondary | ICD-10-CM | POA: Diagnosis present

## 2021-03-10 DIAGNOSIS — Z7951 Long term (current) use of inhaled steroids: Secondary | ICD-10-CM

## 2021-03-10 DIAGNOSIS — Z91048 Other nonmedicinal substance allergy status: Secondary | ICD-10-CM

## 2021-03-10 DIAGNOSIS — E1122 Type 2 diabetes mellitus with diabetic chronic kidney disease: Secondary | ICD-10-CM | POA: Diagnosis present

## 2021-03-10 DIAGNOSIS — I248 Other forms of acute ischemic heart disease: Secondary | ICD-10-CM | POA: Diagnosis present

## 2021-03-10 DIAGNOSIS — Z881 Allergy status to other antibiotic agents status: Secondary | ICD-10-CM

## 2021-03-10 DIAGNOSIS — Z79899 Other long term (current) drug therapy: Secondary | ICD-10-CM

## 2021-03-10 DIAGNOSIS — R778 Other specified abnormalities of plasma proteins: Secondary | ICD-10-CM | POA: Diagnosis present

## 2021-03-10 DIAGNOSIS — Z880 Allergy status to penicillin: Secondary | ICD-10-CM

## 2021-03-10 DIAGNOSIS — N184 Chronic kidney disease, stage 4 (severe): Secondary | ICD-10-CM | POA: Diagnosis present

## 2021-03-10 DIAGNOSIS — R627 Adult failure to thrive: Secondary | ICD-10-CM | POA: Diagnosis present

## 2021-03-10 DIAGNOSIS — E1129 Type 2 diabetes mellitus with other diabetic kidney complication: Secondary | ICD-10-CM

## 2021-03-10 DIAGNOSIS — Z6828 Body mass index (BMI) 28.0-28.9, adult: Secondary | ICD-10-CM

## 2021-03-10 DIAGNOSIS — Z951 Presence of aortocoronary bypass graft: Secondary | ICD-10-CM

## 2021-03-10 DIAGNOSIS — Z66 Do not resuscitate: Secondary | ICD-10-CM | POA: Diagnosis present

## 2021-03-10 DIAGNOSIS — G4733 Obstructive sleep apnea (adult) (pediatric): Secondary | ICD-10-CM | POA: Diagnosis present

## 2021-03-10 DIAGNOSIS — D631 Anemia in chronic kidney disease: Secondary | ICD-10-CM | POA: Diagnosis present

## 2021-03-10 DIAGNOSIS — I10 Essential (primary) hypertension: Secondary | ICD-10-CM | POA: Diagnosis present

## 2021-03-10 DIAGNOSIS — Z955 Presence of coronary angioplasty implant and graft: Secondary | ICD-10-CM

## 2021-03-10 DIAGNOSIS — D649 Anemia, unspecified: Secondary | ICD-10-CM | POA: Diagnosis present

## 2021-03-10 DIAGNOSIS — D464 Refractory anemia, unspecified: Secondary | ICD-10-CM | POA: Diagnosis not present

## 2021-03-10 DIAGNOSIS — I5043 Acute on chronic combined systolic (congestive) and diastolic (congestive) heart failure: Secondary | ICD-10-CM | POA: Diagnosis present

## 2021-03-10 DIAGNOSIS — I25119 Atherosclerotic heart disease of native coronary artery with unspecified angina pectoris: Secondary | ICD-10-CM | POA: Diagnosis present

## 2021-03-10 DIAGNOSIS — I5032 Chronic diastolic (congestive) heart failure: Secondary | ICD-10-CM

## 2021-03-10 DIAGNOSIS — Z8249 Family history of ischemic heart disease and other diseases of the circulatory system: Secondary | ICD-10-CM

## 2021-03-10 DIAGNOSIS — E213 Hyperparathyroidism, unspecified: Secondary | ICD-10-CM | POA: Diagnosis present

## 2021-03-10 DIAGNOSIS — Z20822 Contact with and (suspected) exposure to covid-19: Secondary | ICD-10-CM | POA: Diagnosis present

## 2021-03-10 DIAGNOSIS — I441 Atrioventricular block, second degree: Secondary | ICD-10-CM | POA: Diagnosis present

## 2021-03-10 DIAGNOSIS — I130 Hypertensive heart and chronic kidney disease with heart failure and stage 1 through stage 4 chronic kidney disease, or unspecified chronic kidney disease: Secondary | ICD-10-CM | POA: Diagnosis present

## 2021-03-10 DIAGNOSIS — N400 Enlarged prostate without lower urinary tract symptoms: Secondary | ICD-10-CM | POA: Diagnosis present

## 2021-03-10 DIAGNOSIS — I252 Old myocardial infarction: Secondary | ICD-10-CM

## 2021-03-10 DIAGNOSIS — M810 Age-related osteoporosis without current pathological fracture: Secondary | ICD-10-CM | POA: Diagnosis present

## 2021-03-10 DIAGNOSIS — I2510 Atherosclerotic heart disease of native coronary artery without angina pectoris: Secondary | ICD-10-CM | POA: Diagnosis present

## 2021-03-10 DIAGNOSIS — Z87891 Personal history of nicotine dependence: Secondary | ICD-10-CM

## 2021-03-10 DIAGNOSIS — E785 Hyperlipidemia, unspecified: Secondary | ICD-10-CM | POA: Diagnosis present

## 2021-03-10 LAB — PROTIME-INR
INR: 1.2 (ref 0.8–1.2)
Prothrombin Time: 15.4 seconds — ABNORMAL HIGH (ref 11.4–15.2)

## 2021-03-10 LAB — COMPREHENSIVE METABOLIC PANEL
ALT: 23 U/L (ref 0–44)
AST: 23 U/L (ref 15–41)
Albumin: 3.3 g/dL — ABNORMAL LOW (ref 3.5–5.0)
Alkaline Phosphatase: 112 U/L (ref 38–126)
Anion gap: 12 (ref 5–15)
BUN: 107 mg/dL — ABNORMAL HIGH (ref 8–23)
CO2: 20 mmol/L — ABNORMAL LOW (ref 22–32)
Calcium: 8.8 mg/dL — ABNORMAL LOW (ref 8.9–10.3)
Chloride: 104 mmol/L (ref 98–111)
Creatinine, Ser: 3.31 mg/dL — ABNORMAL HIGH (ref 0.61–1.24)
GFR, Estimated: 17 mL/min — ABNORMAL LOW (ref >60)
Glucose, Bld: 228 mg/dL — ABNORMAL HIGH (ref 70–99)
Potassium: 4.3 mmol/L (ref 3.5–5.1)
Sodium: 136 mmol/L (ref 135–145)
Total Bilirubin: 0.4 mg/dL (ref 0.3–1.2)
Total Protein: 7.1 g/dL (ref 6.5–8.1)

## 2021-03-10 LAB — CBC WITH DIFFERENTIAL/PLATELET
Abs Immature Granulocytes: 0.07 10*3/uL (ref 0.00–0.07)
Basophils Absolute: 0.1 10*3/uL (ref 0.0–0.1)
Basophils Relative: 1 %
Eosinophils Absolute: 0.3 10*3/uL (ref 0.0–0.5)
Eosinophils Relative: 2 %
HCT: 19.2 % — ABNORMAL LOW (ref 39.0–52.0)
Hemoglobin: 5.7 g/dL — CL (ref 13.0–17.0)
Immature Granulocytes: 1 %
Lymphocytes Relative: 5 %
Lymphs Abs: 0.6 10*3/uL — ABNORMAL LOW (ref 0.7–4.0)
MCH: 25.8 pg — ABNORMAL LOW (ref 26.0–34.0)
MCHC: 29.7 g/dL — ABNORMAL LOW (ref 30.0–36.0)
MCV: 86.9 fL (ref 80.0–100.0)
Monocytes Absolute: 1.5 10*3/uL — ABNORMAL HIGH (ref 0.1–1.0)
Monocytes Relative: 12 %
Neutro Abs: 9.6 10*3/uL — ABNORMAL HIGH (ref 1.7–7.7)
Neutrophils Relative %: 79 %
Platelets: 407 10*3/uL — ABNORMAL HIGH (ref 150–400)
RBC: 2.21 MIL/uL — ABNORMAL LOW (ref 4.22–5.81)
RDW: 16 % — ABNORMAL HIGH (ref 11.5–15.5)
WBC: 12.2 10*3/uL — ABNORMAL HIGH (ref 4.0–10.5)
nRBC: 0.8 % — ABNORMAL HIGH (ref 0.0–0.2)

## 2021-03-10 LAB — TROPONIN I (HIGH SENSITIVITY): Troponin I (High Sensitivity): 214 ng/L (ref <18)

## 2021-03-10 MED ORDER — FUROSEMIDE 10 MG/ML IJ SOLN
20.0000 mg | Freq: Once | INTRAMUSCULAR | Status: AC
  Administered 2021-03-11: 20 mg via INTRAVENOUS
  Filled 2021-03-10: qty 4

## 2021-03-10 MED ORDER — SODIUM CHLORIDE 0.9% IV SOLUTION: Freq: Once | INTRAVENOUS | Status: AC

## 2021-03-10 NOTE — Telephone Encounter (Signed)
Reached out to daughter. She stated she will take care of it and reach out to PCP.

## 2021-03-10 NOTE — ED Triage Notes (Signed)
Pt family called out due to pt seeming to have a harder time breathing while ambulating. PT states he guesses that he is short of breath. Pt family also reports that his blood sugars have been higher over the past few days than normal. Of note pt also has a cough that he states he has had for the past three days.

## 2021-03-10 NOTE — ED Notes (Signed)
Hgb 5.7. MD Ralene Bathe made aware,

## 2021-03-10 NOTE — ED Provider Notes (Signed)
[facility] DEPT Provider Note   CSN: [id number] Arrival date & time: 03/10/21  2225     History Chief Complaint  Patient presents with   Shortness of Breath    Clayton [name] is a 87 y.o. male.  The history is provided by the patient, a relative and medical records.  Shortness of Breath Clayton [name] is a 87 y.o. male who presents to the Emergency Department complaining of labored breathing. He presents the emergency department by EMS and is accompanied by his daughter. He presents to the emergency department for labored breathing at that started yesterday. This is been noticed by family members and patient minimizes the symptoms. They reports that his blood sugar has been elevated since Monday. No associated fever. He does have ongoing bilateral lower extremity edema and this is near his baseline. He also has a chronic cough due to COPD and this is unchanged from his baseline. No associated nausea, vomiting, diarrhea, melena, hematochezia. He does have a history of coronary artery disease, anemia, stage IV CKD. He did have a bone marrow biopsy on September 13 and was found to be anemic with hemoglobin of 6.3 at that time. He does get blood transfusions about every 3 to 4 weeks. He did not get a blood transfusion for his hemoglobin of 6.3 on the 13th. He was previously on polenta but this was discontinued due to his recurrent anemia. He has been off the medication for several months.  He is vaccinated and boosted for COVID-19.     Past Medical History:  Diagnosis Date   Anemia    BPH (benign prostatic hyperplasia)    CAD (coronary artery disease)    hx of stemi  04-2018    Chronic renal failure    Chronic renal insufficiency    COPD (chronic obstructive pulmonary disease) (HCC)    DDD (degenerative disc disease), lumbar    Diabetes mellitus    Hyperlipidemia    Hyperparathyroidism ([place])    Hypertension    IDA (iron deficiency anemia)     Neuromuscular disorder (HCC)    OSA on CPAP    Osteomyelitis of forearm (HCC)    Osteoporosis    Wenckebach second degree AV block 11/2020   Event monitor showed Darden Amber block intermittently as well as first-degree block.  Rare occasional PACs and PVCs.  Minimum heart rate 32 bpm at early morning hours.  Maximal heart rate 112 bpm, average 76 bpm.    Patient Active Problem List   Diagnosis Date Noted   NSTEMI (non-ST elevated myocardial infarction) ([place]) 03/11/2021   DM (diabetes mellitus), type 2 with renal complications ([place]) 63/84/5364   Chronic heart failure with preserved ejection fraction (HFpEF) ([place]) 03/11/2021   Weight loss, unintentional 09/12/2020   Leukocytosis 09/12/2020   Unsteady gait when walking 12/14/2019   Rhonchi at both lung bases    AV heart block    CAD, multiple vessel    Normocytic normochromic anemia    Chronic obstructive pulmonary disease (HCC)    ST-segment elevation myocardial infarction (STEMI) of inferior wall ([place]) 04/20/2018   Symptomatic bradycardia 04/20/2018   Presence of drug-eluting stent in right coronary artery 04/18/2018   Iron deficiency 10/30/2016   Cellulitis of right hand 08/11/2016   Chronic kidney disease, stage IV (severe) ([place]) 08/07/2016   Type II or unspecified type diabetes mellitus with renal manifestations, uncontrolled(250.42) 02/03/2014   BPH (benign prostatic hyperplasia) 07/03/2013   Hyperlipidemia associated with type 2 diabetes mellitus ([place])  04/06/2013   Dyspnea 03/04/2013   Smoking history 07/24/2012   Cough variant asthma 07/24/2012   Solitary pulmonary nodule 06/27/2012   Hypoxia 06/19/2012   Diabetes mellitus ([place]) 06/19/2012   Hard of hearing 06/19/2012   Essential hypertension 05/19/2010   OSA treated with BiPAP 05/19/2010    Past Surgical History:  Procedure Laterality Date   CORONARY STENT INTERVENTION N/A 04/20/2018   Procedure: CORONARY STENT INTERVENTION;  Surgeon: Leonie Man, MD;  Location:  [facility];;;     CORONARY/GRAFT ACUTE MI REVASCULARIZATION N/A 04/20/2018   Procedure: Coronary/Graft Acute MI Revascularization;  Surgeon: Leonie Man, MD;  Location: [facility];  Service: Cardiovascular;  Laterality: N/A; --> after initial PTCA restoring flow down the RCA, there was diffuse proximal to mid disease treated with a DES SYNERGY 3 X 38 ( 3.50m)   EYE SURGERY     HOLTER MONITOR  05/2018   Sinus rhythm noted with sinus bradycardia.  Also sinus rhythm with 2-1 AV block noted.  Maximum heart rate was sinus tachycardia 123 bpm.  Rare PVCs accelerated idioventricular rhythm noted.  Intermittent Wenckebach block noted.   I & D EXTREMITY Right 08/13/2016   Procedure: IRRIGATION AND DEBRIDEMENT RIGHT ELBOW AND HAND;  Surgeon: DMilly Jakob MD;  Location: [facility];  Service: Orthopedics;  Laterality: Right;   LEFT HEART CATH AND CORONARY ANGIOGRAPHY N/A 04/20/2018   Procedure: LEFT HEART CATH AND CORONARY ANGIOGRAPHY;  Surgeon: HLeonie Man MD;  [facility]; mRCA 100%&80% (after 55% tapering in pRCA), mLM-ostLAD 40% w/ 85% ostCx, ost-pCx 65% @ Om1 w/ ost 85%. pLAD 60% & 50% after D1 with distal 75%, ostD1 65% & 90% after small side branch.     TRANSTHORACIC ECHOCARDIOGRAM  04/21/2018   EF 50-55%. Posterior Wall HK. Severe RV HK, dilated IVC. Temp wire in place   VASECTOMY     VIDEO BRONCHOSCOPY Bilateral 08/25/2012   Procedure: VIDEO BRONCHOSCOPY WITHOUT FLUORO;  Surgeon: MBrand Males MD;  Location: [facility]  Service: Cardiopulmonary;  Laterality: Bilateral;       Family History  Problem Relation Age of Onset   Diabetes Mellitus II Other    Hypertension Other    Other Father        complications from strep   CVA Father    Cancer Sister    Other Brother        hip replacement complications     Social History   Tobacco Use   Smoking status: Former    Packs/day: 3.00    Years: 33.00    Pack years: 99.00    Types: Cigarettes     Quit date: 06/19/1973    Years since quitting: 47.7   Smokeless tobacco: Former    Quit date: 09/26/1975  Vaping Use   Vaping Use: Never used  Substance Use Topics   Alcohol use: No   Drug use: No    Home Medications Prior to Admission medications   Medication Sig Start Date End Date Taking? Authorizing Provider  ACCU-CHEK SOFTCLIX LANCETS lancets TEST BLOOD SUGAR FOUR TIMES DAILY 07/28/18  Yes KElayne Snare MD  albuterol (PROVENTIL HFA;VENTOLIN HFA) 108 (90 Base) MCG/ACT inhaler Inhale 1-2 puffs into the lungs every 6 (six) hours as needed for wheezing or shortness of breath.   Yes [provider]  Alcohol Swabs (B-D SINGLE USE SWABS REGULAR) PADS USE 7 SWABS DAILY AS DIRECTED 07/15/20  Yes KElayne Snare MD  aspirin EC 81 MG tablet  Take 1 tablet (81 mg total) by mouth daily. Swallow whole. 11/15/20  Yes Leonie Man, MD  atorvastatin (LIPITOR) 40 MG tablet Take 1 tablet (40 mg total) by mouth daily. Patient taking differently: Take 40 mg by mouth. Jory Sims, Fri and Sat 11/15/20 03/11/21 Yes Leonie Man, MD  budesonide-formoterol [facility]) 160-4.5 MCG/ACT inhaler Inhale 2 puffs into the lungs 2 (two) times daily. 07/29/13  Yes Tanda Rockers, MD  cholecalciferol (VITAMIN D) 1000 units tablet Take 2,000 Units by mouth daily.   Yes [provider]  Continuous Blood Gluc Sensor (FREESTYLE LIBRE 14 DAY SENSOR) MISC USE AS DIRECTED EVERY 14 DAYS TO MONITOR BLOOD SUGAR CONTINUOUSLY 12/26/20  Yes Elayne Snare, MD  furosemide (LASIX) 20 MG tablet Take 1 tablet (20 mg total) by mouth 2 (two) times daily. Take seocnd dose about 12 noon each daily 12/14/19  Yes Leonie Man, MD  glucose blood test strip Use as instructed to test blood sugars 4 times daily 06/09/18  Yes Elayne Snare, MD  insulin aspart (NOVOLOG) 100 UNIT/ML injection INJECT 7 UNITS SUBCUTANEOUS EVERY MORNING, 6 UNITS AT LUNCH, AND 9 UNITS AT SUPPER 09/27/20  Yes Elayne Snare, MD  insulin glargine (LANTUS) 100 UNIT/ML  injection Inject 0.16 mLs (16 Units total) into the skin daily. 09/21/20  Yes Elayne Snare, MD  isosorbide mononitrate (IMDUR) 30 MG 24 hr tablet TAKE 1 TABLET BY MOUTH DAILY 04/12/20  Yes Leonie Man, MD  Melatonin 5 MG TABS Take 2 tablets by mouth at bedtime.    Yes [provider]  montelukast (SINGULAIR) 10 MG tablet Take 10 mg by mouth at bedtime. 10/30/19  Yes [provider]  Multiple Vitamin (MULTIVITAMIN WITH MINERALS) TABS tablet Take 1 tablet by mouth daily.   Yes [provider]  nitroGLYCERIN (NITROSTAT) 0.4 MG SL tablet Place 1 tablet (0.4 mg total) under the tongue every 5 (five) minutes as needed for chest pain. 05/21/19 03/11/21 Yes Leonie Man, MD  omeprazole (PRILOSEC) 20 MG capsule Take 1 capsule (20 mg total) by mouth daily. 11/17/18  Yes Danis, Kirke Corin, MD  oxybutynin (DITROPAN) 5 MG tablet Take 5 mg by mouth daily. 02/06/19  Yes [provider]  TRUEPLUS INSULIN SYRINGE 31G X 5/16" 0.3 ML MISC USE THREE TIMES DAILY AS NEEDED 01/16/21  Yes Elayne Snare, MD  benzonatate (TESSALON) 100 MG capsule Take by mouth 3 (three) times daily as needed for cough. Patient not taking: Reported on 03/11/2021    [provider]  Continuous Blood Gluc Receiver (FREESTYLE LIBRE 14 DAY READER) [place] 1 each by Does not apply route every 14 (fourteen) days. Use reader to monitor blood sugar continuously with freestyle libre sensor. 08/19/20   Elayne Snare, MD  finasteride (PROSCAR) 5 MG tablet TAKE 1 TABLET EVERY DAY Patient not taking: Reported on 03/11/2021 04/23/19   Elayne Snare, MD    Allergies    Doxycycline, Levaquin [levofloxacin in d5w], Aliskiren, Lisinopril, Losartan potassium, Nitrous oxide, Penicillins, Tape, and Diltiazem  Review of Systems   Review of Systems  Respiratory:  Positive for shortness of breath.   All other systems reviewed and are negative.  Physical Exam Updated Vital Signs BP 126/62   Pulse 85   Temp 97.8 F (36.6 C)  (Oral)   Resp 19   Ht _0  (1.778 m)   Wt 90.3 kg   SpO2 100%   BMI 28.55 kg/m   Physical Exam Vitals and nursing note reviewed.  Constitutional:  General: He is in acute distress.     Appearance: He is well-developed. He is ill-appearing.  HENT:     Head: Normocephalic and atraumatic.  Cardiovascular:     Rate and Rhythm: Normal rate and regular rhythm.     Heart sounds: No murmur heard. Pulmonary:     Effort: Pulmonary effort is normal. No respiratory distress.     Comments: Crackles bilaterally Abdominal:     Palpations: Abdomen is soft.     Tenderness: There is no abdominal tenderness. There is no guarding or rebound.  Musculoskeletal:        General: Swelling present. No tenderness.     Comments: 1+ pitting edema to BLE  Skin:    General: Skin is warm and dry.     Coloration: Skin is pale.  Neurological:     Mental Status: He is alert.     Comments: Oriented to place and month, disoriented to month.  Slow to answer questions.    Psychiatric:        Behavior: Behavior normal.    ED Results / Procedures / Treatments   Labs (all labs ordered are listed, but only abnormal results are displayed) Labs Reviewed  CBC WITH DIFFERENTIAL/PLATELET - Abnormal; Notable for the following components:      Result Value   WBC 12.2 (*)    RBC 2.21 (*)    Hemoglobin 5.7 (*)    HCT 19.2 (*)    MCH 25.8 (*)    MCHC 29.7 (*)    RDW 16.0 (*)    Platelets 407 (*)    nRBC 0.8 (*)    Neutro Abs 9.6 (*)    Lymphs Abs 0.6 (*)    Monocytes Absolute 1.5 (*)    All other components within normal limits  COMPREHENSIVE METABOLIC PANEL - Abnormal; Notable for the following components:   CO2 20 (*)    Glucose, Bld 228 (*)    BUN 107 (*)    Creatinine, Ser 3.31 (*)    Calcium 8.8 (*)    Albumin 3.3 (*)    GFR, Estimated 17 (*)    All other components within normal limits  BRAIN NATRIURETIC PEPTIDE - Abnormal; Notable for the following components:   B Natriuretic Peptide  3,264.2 (*)    All other components within normal limits  PROTIME-INR - Abnormal; Notable for the following components:   Prothrombin Time 15.4 (*)    All other components within normal limits  TROPONIN I (HIGH SENSITIVITY) - Abnormal; Notable for the following components:   Troponin I (High Sensitivity) 214 (*)    All other components within normal limits  TROPONIN I (HIGH SENSITIVITY) - Abnormal; Notable for the following components:   Troponin I (High Sensitivity) 243 (*)    All other components within normal limits  RESP PANEL BY RT-PCR (FLU A&B, COVID) [id number]  I-STAT CHEM 8, ED  TYPE AND SCREEN  PREPARE RBC (CROSSMATCH)    EKG None  Radiology DG Chest 2 View  Result Date: 03/10/2021 CLINICAL DATA:  Shortness of breath, high blood pressure. Hx of CAD, COPD, HTN, ex smoker. EXAM: CHEST - 2 VIEW COMPARISON:  Chest x-ray 06/13/2018 FINDINGS: The heart and mediastinal contours are unchanged. Aortic calcification. Patchy interstitial and airspace opacities. Bilateral trace to small volume pleural effusions. No pneumothorax. No acute osseous abnormality. IMPRESSION: Patchy interstitial and airspace opacities. Associated bilateral trace to small volume pleural effusions. Findings likely represent pulmonary edema with superimposed infection not excluded. Followup PA and lateral  chest X-ray is recommended in 3-4 weeks following therapy to ensure resolution and exclude underlying malignancy. Electronically Signed   By: Iven Finn M.D.   On: 03/10/2021 22:52    Procedures Procedures  CRITICAL CARE Performed by: Quintella Reichert   Total critical care time: 45 minutes  Critical care time was exclusive of separately billable procedures and treating other patients.  Critical care was necessary to treat or prevent imminent or life-threatening deterioration.  Critical care was time spent personally by me on the following activities: development of treatment plan with patient and/or  surrogate as well as nursing, discussions with consultants, evaluation of patient's response to treatment, examination of patient, obtaining history from patient or surrogate, ordering and performing treatments and interventions, ordering and review of laboratory studies, ordering and review of radiographic studies, pulse oximetry and re-evaluation of patient's condition.  Medications Ordered in ED Medications  0.9 %  sodium chloride infusion (Manually program via Guardrails IV Fluids) ( Intravenous New Bag/Given 03/11/21 0100)  furosemide (LASIX) injection 20 mg (20 mg Intravenous Given 03/11/21 0057)    ED Course  I have reviewed the triage vital signs and the nursing notes.  Pertinent labs & imaging results that were available during my care of the patient were reviewed by me and considered in my medical decision making (see chart for details).    MDM Rules/Calculators/A&P                         patient with history of CHF, COPD, CKD stage IV, anemia here for evaluation of progressive shortness of breath over the last 48 hours. He is ill appearing on evaluation. Labs significant for progressive anemia, mild worsening in his chronic renal insufficiency. His troponin is elevated at 200 and his EKG does have new ST depressions compared to priors. Patient has no active chest pain. Suspect that his troponin leak is due to demand given his anemia. He is volume overloaded on evaluation. Will need Lasix prior to blood transfusion. Discussed with patient and daughter findings of studies, plan to transfuse, admit for ongoing treatment. There in agreement with treatment plan. Hospitalist consulted for admission   Final Clinical Impression(s) / ED Diagnoses Final diagnoses:  Symptomatic anemia    Rx / DC Orders ED Discharge Orders     None        Quintella Reichert, MD 03/11/21 [phone]

## 2021-03-11 ENCOUNTER — Encounter (HOSPITAL_COMMUNITY): Payer: Self-pay | Admitting: Family Medicine

## 2021-03-11 ENCOUNTER — Inpatient Hospital Stay (HOSPITAL_COMMUNITY): Payer: HMO

## 2021-03-11 DIAGNOSIS — I214 Non-ST elevation (NSTEMI) myocardial infarction: Secondary | ICD-10-CM | POA: Diagnosis present

## 2021-03-11 DIAGNOSIS — E1129 Type 2 diabetes mellitus with other diabetic kidney complication: Secondary | ICD-10-CM | POA: Diagnosis not present

## 2021-03-11 DIAGNOSIS — E785 Hyperlipidemia, unspecified: Secondary | ICD-10-CM | POA: Diagnosis present

## 2021-03-11 DIAGNOSIS — D649 Anemia, unspecified: Secondary | ICD-10-CM

## 2021-03-11 DIAGNOSIS — N400 Enlarged prostate without lower urinary tract symptoms: Secondary | ICD-10-CM | POA: Diagnosis present

## 2021-03-11 DIAGNOSIS — R778 Other specified abnormalities of plasma proteins: Secondary | ICD-10-CM | POA: Diagnosis present

## 2021-03-11 DIAGNOSIS — I5043 Acute on chronic combined systolic (congestive) and diastolic (congestive) heart failure: Secondary | ICD-10-CM | POA: Diagnosis present

## 2021-03-11 DIAGNOSIS — I441 Atrioventricular block, second degree: Secondary | ICD-10-CM | POA: Diagnosis present

## 2021-03-11 DIAGNOSIS — D631 Anemia in chronic kidney disease: Secondary | ICD-10-CM | POA: Diagnosis present

## 2021-03-11 DIAGNOSIS — I5032 Chronic diastolic (congestive) heart failure: Secondary | ICD-10-CM

## 2021-03-11 DIAGNOSIS — E213 Hyperparathyroidism, unspecified: Secondary | ICD-10-CM | POA: Diagnosis present

## 2021-03-11 DIAGNOSIS — I2510 Atherosclerotic heart disease of native coronary artery without angina pectoris: Secondary | ICD-10-CM | POA: Diagnosis present

## 2021-03-11 DIAGNOSIS — Z20822 Contact with and (suspected) exposure to covid-19: Secondary | ICD-10-CM | POA: Diagnosis present

## 2021-03-11 DIAGNOSIS — J449 Chronic obstructive pulmonary disease, unspecified: Secondary | ICD-10-CM | POA: Diagnosis present

## 2021-03-11 DIAGNOSIS — Z6828 Body mass index (BMI) 28.0-28.9, adult: Secondary | ICD-10-CM | POA: Diagnosis not present

## 2021-03-11 DIAGNOSIS — Z880 Allergy status to penicillin: Secondary | ICD-10-CM | POA: Diagnosis not present

## 2021-03-11 DIAGNOSIS — I130 Hypertensive heart and chronic kidney disease with heart failure and stage 1 through stage 4 chronic kidney disease, or unspecified chronic kidney disease: Secondary | ICD-10-CM | POA: Diagnosis present

## 2021-03-11 DIAGNOSIS — N184 Chronic kidney disease, stage 4 (severe): Secondary | ICD-10-CM | POA: Diagnosis present

## 2021-03-11 DIAGNOSIS — Z888 Allergy status to other drugs, medicaments and biological substances status: Secondary | ICD-10-CM | POA: Diagnosis not present

## 2021-03-11 DIAGNOSIS — D464 Refractory anemia, unspecified: Secondary | ICD-10-CM | POA: Diagnosis present

## 2021-03-11 DIAGNOSIS — Z881 Allergy status to other antibiotic agents status: Secondary | ICD-10-CM | POA: Diagnosis not present

## 2021-03-11 DIAGNOSIS — E1122 Type 2 diabetes mellitus with diabetic chronic kidney disease: Secondary | ICD-10-CM | POA: Diagnosis present

## 2021-03-11 DIAGNOSIS — R627 Adult failure to thrive: Secondary | ICD-10-CM | POA: Diagnosis present

## 2021-03-11 DIAGNOSIS — Z91048 Other nonmedicinal substance allergy status: Secondary | ICD-10-CM | POA: Diagnosis not present

## 2021-03-11 DIAGNOSIS — I248 Other forms of acute ischemic heart disease: Secondary | ICD-10-CM | POA: Diagnosis present

## 2021-03-11 DIAGNOSIS — L03115 Cellulitis of right lower limb: Secondary | ICD-10-CM | POA: Diagnosis present

## 2021-03-11 DIAGNOSIS — I252 Old myocardial infarction: Secondary | ICD-10-CM | POA: Diagnosis not present

## 2021-03-11 DIAGNOSIS — I5033 Acute on chronic diastolic (congestive) heart failure: Secondary | ICD-10-CM | POA: Diagnosis not present

## 2021-03-11 DIAGNOSIS — N179 Acute kidney failure, unspecified: Secondary | ICD-10-CM | POA: Diagnosis present

## 2021-03-11 DIAGNOSIS — Z66 Do not resuscitate: Secondary | ICD-10-CM | POA: Diagnosis present

## 2021-03-11 LAB — TROPONIN I (HIGH SENSITIVITY): Troponin I (High Sensitivity): 243 ng/L (ref <18)

## 2021-03-11 LAB — CBC
HCT: 24 % — ABNORMAL LOW (ref 39.0–52.0)
Hemoglobin: 7.5 g/dL — ABNORMAL LOW (ref 13.0–17.0)
MCH: 26.8 pg (ref 26.0–34.0)
MCHC: 31.3 g/dL (ref 30.0–36.0)
MCV: 85.7 fL (ref 80.0–100.0)
Platelets: 327 10*3/uL (ref 150–400)
RBC: 2.8 MIL/uL — ABNORMAL LOW (ref 4.22–5.81)
RDW: 15.7 % — ABNORMAL HIGH (ref 11.5–15.5)
WBC: 11.8 10*3/uL — ABNORMAL HIGH (ref 4.0–10.5)
nRBC: 0.7 % — ABNORMAL HIGH (ref 0.0–0.2)

## 2021-03-11 LAB — BASIC METABOLIC PANEL
Anion gap: 8 (ref 5–15)
BUN: 97 mg/dL — ABNORMAL HIGH (ref 8–23)
CO2: 22 mmol/L (ref 22–32)
Calcium: 8.6 mg/dL — ABNORMAL LOW (ref 8.9–10.3)
Chloride: 108 mmol/L (ref 98–111)
Creatinine, Ser: 3.02 mg/dL — ABNORMAL HIGH (ref 0.61–1.24)
GFR, Estimated: 19 mL/min — ABNORMAL LOW (ref >60)
Glucose, Bld: 204 mg/dL — ABNORMAL HIGH (ref 70–99)
Potassium: 3.8 mmol/L (ref 3.5–5.1)
Sodium: 138 mmol/L (ref 135–145)

## 2021-03-11 LAB — ECHOCARDIOGRAM COMPLETE
Area-P 1/2: 4.01 cm2
Height: 70 in
S' Lateral: 3.4 cm
Weight: 3184 oz

## 2021-03-11 LAB — RESP PANEL BY RT-PCR (FLU A&B, COVID) ARPGX2
Influenza A by PCR: NEGATIVE
Influenza B by PCR: NEGATIVE
SARS Coronavirus 2 by RT PCR: NEGATIVE

## 2021-03-11 LAB — PREPARE RBC (CROSSMATCH)

## 2021-03-11 LAB — BRAIN NATRIURETIC PEPTIDE: B Natriuretic Peptide: 3264.2 pg/mL — ABNORMAL HIGH (ref 0.0–100.0)

## 2021-03-11 LAB — GLUCOSE, CAPILLARY
Glucose-Capillary: 309 mg/dL — ABNORMAL HIGH (ref 70–99)
Glucose-Capillary: 263 mg/dL — ABNORMAL HIGH (ref 70–99)

## 2021-03-11 LAB — CBG MONITORING, ED
Glucose-Capillary: 189 mg/dL — ABNORMAL HIGH (ref 70–99)
Glucose-Capillary: 271 mg/dL — ABNORMAL HIGH (ref 70–99)

## 2021-03-11 MED ORDER — VITAMIN D 25 MCG (1000 UNIT) PO TABS
2000.0000 [IU] | ORAL_TABLET | Freq: Every day | ORAL | Status: DC
  Administered 2021-03-11 – 2021-03-12 (×2): 2000 [IU] via ORAL
  Filled 2021-03-11 (×2): qty 2

## 2021-03-11 MED ORDER — PANTOPRAZOLE SODIUM 40 MG PO TBEC
40.0000 mg | DELAYED_RELEASE_TABLET | Freq: Every day | ORAL | Status: DC
  Administered 2021-03-11 – 2021-03-12 (×2): 40 mg via ORAL
  Filled 2021-03-11 (×2): qty 1

## 2021-03-11 MED ORDER — NITROGLYCERIN 0.4 MG SL SUBL: 0.4000 mg | SUBLINGUAL_TABLET | SUBLINGUAL | Status: DC | PRN

## 2021-03-11 MED ORDER — ACETAMINOPHEN 325 MG PO TABS: 650.0000 mg | ORAL_TABLET | Freq: Four times a day (QID) | ORAL | Status: DC | PRN

## 2021-03-11 MED ORDER — FUROSEMIDE 20 MG PO TABS
20.0000 mg | ORAL_TABLET | ORAL | Status: DC
  Administered 2021-03-12 (×2): 20 mg via ORAL
  Filled 2021-03-11 (×2): qty 1

## 2021-03-11 MED ORDER — ALBUTEROL SULFATE (2.5 MG/3ML) 0.083% IN NEBU: 2.5000 mg | INHALATION_SOLUTION | Freq: Four times a day (QID) | RESPIRATORY_TRACT | Status: DC | PRN

## 2021-03-11 MED ORDER — ALBUTEROL SULFATE HFA 108 (90 BASE) MCG/ACT IN AERS: 2.0000 | INHALATION_SPRAY | RESPIRATORY_TRACT | Status: DC | PRN

## 2021-03-11 MED ORDER — FUROSEMIDE 10 MG/ML IJ SOLN: 20.0000 mg | Freq: Once | INTRAMUSCULAR | Status: DC

## 2021-03-11 MED ORDER — FUROSEMIDE 10 MG/ML IJ SOLN
20.0000 mg | Freq: Once | INTRAMUSCULAR | Status: AC
  Administered 2021-03-11: 20 mg via INTRAVENOUS
  Filled 2021-03-11: qty 4

## 2021-03-11 MED ORDER — ISOSORBIDE MONONITRATE ER 30 MG PO TB24
30.0000 mg | ORAL_TABLET | Freq: Every day | ORAL | Status: DC
  Administered 2021-03-11 – 2021-03-12 (×2): 30 mg via ORAL
  Filled 2021-03-11 (×2): qty 1

## 2021-03-11 MED ORDER — CEPHALEXIN 500 MG PO CAPS
500.0000 mg | ORAL_CAPSULE | Freq: Two times a day (BID) | ORAL | Status: DC
  Administered 2021-03-11 – 2021-03-12 (×2): 500 mg via ORAL
  Filled 2021-03-11 (×3): qty 1

## 2021-03-11 MED ORDER — ADULT MULTIVITAMIN W/MINERALS CH
1.0000 | ORAL_TABLET | Freq: Every day | ORAL | Status: DC
  Administered 2021-03-11 – 2021-03-12 (×2): 1 via ORAL
  Filled 2021-03-11 (×2): qty 1

## 2021-03-11 MED ORDER — MELATONIN 5 MG PO TABS
10.0000 mg | ORAL_TABLET | Freq: Every day | ORAL | Status: DC
  Administered 2021-03-11: 10 mg via ORAL
  Filled 2021-03-11: qty 2

## 2021-03-11 MED ORDER — INSULIN ASPART 100 UNIT/ML IJ SOLN
0.0000 [IU] | Freq: Three times a day (TID) | INTRAMUSCULAR | Status: DC
  Administered 2021-03-11: 5 [IU] via SUBCUTANEOUS
  Administered 2021-03-11: 7 [IU] via SUBCUTANEOUS
  Administered 2021-03-11: 2 [IU] via SUBCUTANEOUS
  Administered 2021-03-12: 5 [IU] via SUBCUTANEOUS
  Administered 2021-03-12: 3 [IU] via SUBCUTANEOUS
  Filled 2021-03-11: qty 0.09

## 2021-03-11 MED ORDER — FUROSEMIDE 10 MG/ML IJ SOLN
40.0000 mg | Freq: Two times a day (BID) | INTRAMUSCULAR | Status: DC
Start: 2021-03-11 — End: 2021-03-11
  Administered 2021-03-11: 40 mg via INTRAVENOUS
  Filled 2021-03-11: qty 4

## 2021-03-11 MED ORDER — OXYBUTYNIN CHLORIDE 5 MG PO TABS
5.0000 mg | ORAL_TABLET | Freq: Two times a day (BID) | ORAL | Status: DC
  Administered 2021-03-11 – 2021-03-12 (×3): 5 mg via ORAL
  Filled 2021-03-11 (×3): qty 1

## 2021-03-11 MED ORDER — ASPIRIN EC 81 MG PO TBEC
81.0000 mg | DELAYED_RELEASE_TABLET | Freq: Every day | ORAL | Status: DC
  Administered 2021-03-11 – 2021-03-12 (×2): 81 mg via ORAL
  Filled 2021-03-11 (×2): qty 1

## 2021-03-11 MED ORDER — SODIUM CHLORIDE 0.9% FLUSH
3.0000 mL | Freq: Two times a day (BID) | INTRAVENOUS | Status: DC
  Administered 2021-03-11 – 2021-03-12 (×3): 3 mL via INTRAVENOUS

## 2021-03-11 MED ORDER — MONTELUKAST SODIUM 10 MG PO TABS
10.0000 mg | ORAL_TABLET | Freq: Every day | ORAL | Status: DC
  Administered 2021-03-11: 10 mg via ORAL
  Filled 2021-03-11: qty 1

## 2021-03-11 MED ORDER — SODIUM CHLORIDE 0.9 % IV SOLN: 250.0000 mL | INTRAVENOUS | Status: DC | PRN

## 2021-03-11 MED ORDER — MOMETASONE FURO-FORMOTEROL FUM 200-5 MCG/ACT IN AERO
2.0000 | INHALATION_SPRAY | Freq: Two times a day (BID) | RESPIRATORY_TRACT | Status: DC
  Administered 2021-03-11: 2 via RESPIRATORY_TRACT
  Filled 2021-03-11: qty 8.8

## 2021-03-11 MED ORDER — INSULIN ASPART 100 UNIT/ML IJ SOLN
0.0000 [IU] | Freq: Every day | INTRAMUSCULAR | Status: DC
  Administered 2021-03-11: 3 [IU] via SUBCUTANEOUS
  Filled 2021-03-11: qty 0.05

## 2021-03-11 MED ORDER — SODIUM CHLORIDE 0.9% FLUSH: 3.0000 mL | INTRAVENOUS | Status: DC | PRN

## 2021-03-11 MED ORDER — FINASTERIDE 5 MG PO TABS
5.0000 mg | ORAL_TABLET | Freq: Every day | ORAL | Status: DC
  Administered 2021-03-11: 5 mg via ORAL
  Filled 2021-03-11: qty 1

## 2021-03-11 MED ORDER — ACETAMINOPHEN 650 MG RE SUPP: 650.0000 mg | Freq: Four times a day (QID) | RECTAL | Status: DC | PRN

## 2021-03-11 MED ORDER — INSULIN GLARGINE-YFGN 100 UNIT/ML ~~LOC~~ SOLN
16.0000 [IU] | Freq: Every day | SUBCUTANEOUS | Status: DC
  Administered 2021-03-11 – 2021-03-12 (×2): 16 [IU] via SUBCUTANEOUS
  Filled 2021-03-11 (×2): qty 0.16

## 2021-03-11 MED ORDER — ATORVASTATIN CALCIUM 40 MG PO TABS: 40.0000 mg | ORAL_TABLET | ORAL | Status: DC

## 2021-03-11 MED ORDER — ATORVASTATIN CALCIUM 40 MG PO TABS
40.0000 mg | ORAL_TABLET | ORAL | Status: DC
  Administered 2021-03-11: 40 mg via ORAL
  Filled 2021-03-11: qty 1

## 2021-03-11 NOTE — H&P (Signed)
History and Physical    Clayton [name] LYY:[id number] DOB: 04-11-1928 DOA: 03/10/2021  PCP: Clayton Amel, MD   Patient coming from: Home  Chief Complaint: SOB, weakness  HPI: Clayton [name] is a 87 y.o. male with medical history significant for CAD, COPD, CKD 4, DMT2, anemia, HTN, HLD who presents by EMS with complaint of weakness and shortness of breath.  Patient lives with his daughter and she reports she has noticed he has been having increased labored breathing and for the last day or 2.  She reports she has been less active and not getting up as much as he normally does.  She reports his blood sugars have been elevated the last few days in the 270-290 range. He has not complained of chest pain or pressure or palpitations.  He has a chronic cough that is dry with COPD and is unchanged from his baseline.  Not had any nausea or vomiting.  Daughter reports he has had some urinary incontinence but she feels this is due to him not being able to get to the bathroom in time.  He does have a history of anemia and is required multiple blood transfusions over the last few months.  He had a bone marrow biopsy last week with hematology to evaluate if patient is eligible for Epogen shots.  He does have CKD stage IV and is followed by nephrology.  On September 13 he had a hemoglobin of 6.3 but did not receive a blood transfusion at that time.   ED Course: Clayton [name] has stable vital signs in the emergency room.  He is found to have elevated troponin over 200.  He has a hemoglobin of 5.7 gm/dl and ER physician ordered 2 units of packed red blood cells to be transfused.  He also has an elevated BNP 3264.  He has pulmonary edema on chest x-ray.  He was given dose of Lasix in the emergency room for diuresis.  Initial troponin was 214 and repeat troponin is 248.  Patient has no chest pain.  WBC 12,200 hemoglobin 5.7 hematocrit 19.2 platelets 407,000 sodium 136 potassium 4.3 chloride 104 bicarb 20 creatinine 3.31 BUN  107 glucose 228 alkaline phosphatase 112 AST 23 ALT 23 bilirubin 0.4.  Hospitalist service been asked admit for further management  Review of Systems:  General: Reports generalized weakness. Denies fever, chills, weight loss, night sweats.  Denies dizziness.  Denies change in appetite HENT: Denies head trauma, headache, denies change in hearing, tinnitus. Denies nasal congestion. Denies sore throat.  Denies difficulty swallowing Eyes: Denies blurry vision, pain in eye, drainage.  Denies discoloration of eyes. Neck: Denies pain.  Denies swelling.  Denies pain with movement. Cardiovascular: Denies chest pain, palpitations.  Denies edema.  Denies orthopnea Respiratory: Reports shortness of breath, chronic cough.  Denies wheezing.  Denies sputum production Gastrointestinal: Denies abdominal pain, swelling.  Denies nausea, vomiting, diarrhea.  Denies melena.  Denies hematemesis. Musculoskeletal: Denies limitation of movement.  Denies deformity or swelling.  Denies pain. Genitourinary: Denies pelvic pain.  Denies urinary frequency or hesitancy.  Denies dysuria.  Skin: Denies rash.  Denies petechiae, purpura, ecchymosis. Neurological: Denies syncope. Denies seizure activity. Denies paresthesia. Denies slurred speech, drooping face.  Denies visual change. Psychiatric: Denies depression, anxiety.  Denies hallucinations.  Past Medical History:  Diagnosis Date   Anemia    BPH (benign prostatic hyperplasia)    CAD (coronary artery disease)    hx of stemi  04-2018    Chronic renal failure  Chronic renal insufficiency    COPD (chronic obstructive pulmonary disease) (HCC)    DDD (degenerative disc disease), lumbar    Diabetes mellitus    Hyperlipidemia    Hyperparathyroidism ([place])    Hypertension    IDA (iron deficiency anemia)    Neuromuscular disorder (HCC)    OSA on CPAP    Osteomyelitis of forearm (HCC)    Osteoporosis    Wenckebach second degree AV block 11/2020   Event monitor showed  Darden Amber block intermittently as well as first-degree block.  Rare occasional PACs and PVCs.  Minimum heart rate 32 bpm at early morning hours.  Maximal heart rate 112 bpm, average 76 bpm.    Past Surgical History:  Procedure Laterality Date   CORONARY STENT INTERVENTION N/A 04/20/2018   Procedure: CORONARY STENT INTERVENTION;  Surgeon: Leonie Man, MD;  Location: [facility];;;     CORONARY/GRAFT ACUTE MI REVASCULARIZATION N/A 04/20/2018   Procedure: Coronary/Graft Acute MI Revascularization;  Surgeon: Leonie Man, MD;  Location: [facility];  Service: Cardiovascular;  Laterality: N/A; --> after initial PTCA restoring flow down the RCA, there was diffuse proximal to mid disease treated with a DES SYNERGY 3 X 38 ( 3.20m)   EYE SURGERY     HOLTER MONITOR  05/2018   Sinus rhythm noted with sinus bradycardia.  Also sinus rhythm with 2-1 AV block noted.  Maximum heart rate was sinus tachycardia 123 bpm.  Rare PVCs accelerated idioventricular rhythm noted.  Intermittent Wenckebach block noted.   I & D EXTREMITY Right 08/13/2016   Procedure: IRRIGATION AND DEBRIDEMENT RIGHT ELBOW AND HAND;  Surgeon: DMilly Jakob MD;  Location: [facility];  Service: Orthopedics;  Laterality: Right;   LEFT HEART CATH AND CORONARY ANGIOGRAPHY N/A 04/20/2018   Procedure: LEFT HEART CATH AND CORONARY ANGIOGRAPHY;  Surgeon: HLeonie Man MD;  [facility]; mRCA 100%&80% (after 55% tapering in pRCA), mLM-ostLAD 40% w/ 85% ostCx, ost-pCx 65% @ Om1 w/ ost 85%. pLAD 60% & 50% after D1 with distal 75%, ostD1 65% & 90% after small side branch.     TRANSTHORACIC ECHOCARDIOGRAM  04/21/2018   EF 50-55%. Posterior Wall HK. Severe RV HK, dilated IVC. Temp wire in place   VASECTOMY     VIDEO BRONCHOSCOPY Bilateral 08/25/2012   Procedure: VIDEO BRONCHOSCOPY WITHOUT FLUORO;  Surgeon: MBrand Males MD;  [facility]  Service: Cardiopulmonary;  Laterality: Bilateral;    Social  History  reports that he quit smoking about 47 years ago. His smoking use included cigarettes. He has a 99.00 pack-year smoking history. He quit smokeless tobacco use about 45 years ago. He reports that he does not drink alcohol and does not use drugs.  Allergies  Allergen Reactions   Doxycycline Rash   Levaquin [Levofloxacin In D5w]     Caused chest pain and heartburn   Aliskiren Other (See Comments)    Unknown per Daughter    Lisinopril Other (See Comments)    Unknown per Daughter    Losartan Potassium Other (See Comments)   Nitrous Oxide Other (See Comments)    Reaction:  Unknown    Penicillins Other (See Comments)    Reaction:  Unknown Has patient had a PCN reaction causing immediate rash, facial/tongue/throat swelling, SOB or lightheadedness with hypotension: Unsure Has patient had a PCN reaction causing severe rash involving mucus membranes or skin necrosis: Unsure Has patient had a PCN reaction that required hospitalization Unsure  Has patient had  a PCN reaction occurring within the last 10 years: No If all of the above answers are "NO", then may proceed with Cephalosporin use.   Tape Other (See Comments)    Reaction:  Tears pts skin    Diltiazem Rash    Family History  Problem Relation Age of Onset   Diabetes Mellitus II Other    Hypertension Other    Other Father        complications from strep   CVA Father    Cancer Sister    Other Brother        hip replacement complications      Prior to Admission medications   Medication Sig Start Date End Date Taking? Authorizing Provider  ACCU-CHEK SOFTCLIX LANCETS lancets TEST BLOOD SUGAR FOUR TIMES DAILY 07/28/18   Elayne Snare, MD  albuterol (PROVENTIL HFA;VENTOLIN HFA) 108 (90 Base) MCG/ACT inhaler Inhale 1-2 puffs into the lungs every 6 (six) hours as needed for wheezing or shortness of breath.    [provider]  Alcohol Swabs (B-D SINGLE USE SWABS REGULAR) PADS USE 7 SWABS DAILY AS DIRECTED 07/15/20   Elayne Snare, MD  aspirin EC 81 MG tablet Take 1 tablet (81 mg total) by mouth daily. Swallow whole. 11/15/20   Leonie Man, MD  atorvastatin (LIPITOR) 40 MG tablet Take 1 tablet (40 mg total) by mouth daily. Patient taking differently: Take 40 mg by mouth. Jory Sims, Fri and Sat 11/15/20 02/28/21  Leonie Man, MD  benzonatate (TESSALON) 100 MG capsule Take by mouth 3 (three) times daily as needed for cough.    [provider]  budesonide-formoterol (SYMBICORT) 160-4.5 MCG/ACT inhaler Inhale 2 puffs into the lungs 2 (two) times daily. 07/29/13   Tanda Rockers, MD  cholecalciferol (VITAMIN D) 1000 units tablet Take 2,000 Units by mouth daily.    [provider]  Continuous Blood Gluc Receiver (FREESTYLE LIBRE 14 DAY READER) DEVI 1 each by Does not apply route every 14 (fourteen) days. Use reader to monitor blood sugar continuously with freestyle libre sensor. 08/19/20   Elayne Snare, MD  Continuous Blood Gluc Sensor (FREESTYLE LIBRE 14 DAY SENSOR) MISC USE AS DIRECTED EVERY 14 DAYS TO MONITOR BLOOD SUGAR CONTINUOUSLY 12/26/20   Elayne Snare, MD  finasteride (PROSCAR) 5 MG tablet TAKE 1 TABLET EVERY DAY 04/23/19   Elayne Snare, MD  furosemide (LASIX) 20 MG tablet Take 1 tablet (20 mg total) by mouth 2 (two) times daily. Take seocnd dose about 12 noon each daily 12/14/19   Leonie Man, MD  glucose blood test strip Use as instructed to test blood sugars 4 times daily 06/09/18   Elayne Snare, MD  insulin aspart (NOVOLOG) 100 UNIT/ML injection INJECT 7 UNITS SUBCUTANEOUS EVERY MORNING, 6 UNITS AT LUNCH, AND 9 UNITS AT SUPPER 09/27/20   Elayne Snare, MD  insulin glargine (LANTUS) 100 UNIT/ML injection Inject 0.16 mLs (16 Units total) into the skin daily. 09/21/20   Elayne Snare, MD  isosorbide mononitrate (IMDUR) 30 MG 24 hr tablet TAKE 1 TABLET BY MOUTH DAILY 04/12/20   Leonie Man, MD  Melatonin 5 MG TABS Take 2 tablets by mouth at bedtime.     [provider]  montelukast  (SINGULAIR) 10 MG tablet  10/30/19   [provider]  Multiple Vitamin (MULTIVITAMIN WITH MINERALS) TABS tablet Take 1 tablet by mouth daily.    [provider]  nitroGLYCERIN (NITROSTAT) 0.4 MG SL tablet Place 1 tablet (0.4 mg total) under the  tongue every 5 (five) minutes as needed for chest pain. 05/21/19 08/19/19  Leonie Man, MD  omeprazole (PRILOSEC) 20 MG capsule Take 1 capsule (20 mg total) by mouth daily. 11/17/18   Doran Stabler, MD  oxybutynin (DITROPAN) 5 MG tablet  02/06/19   [provider]  TRUEPLUS INSULIN SYRINGE 31G X 5/16" 0.3 ML MISC USE THREE TIMES DAILY AS NEEDED 01/16/21   Elayne Snare, MD    Physical Exam: Vitals:   03/10/21 2236 03/10/21 2345 03/11/21 0050 03/11/21 0107  BP:  (!) 131/58 (!) 132/59 126/62  Pulse:  85 84 85  Resp:  19 (!) 21 19  Temp:   97.8 F (36.6 C) 97.8 F (36.6 C)  TempSrc:   Oral Oral  SpO2:  100% 100% 100%  Weight: 90.3 kg     Height: 5' 10"  (1.778 m)       Constitutional: NAD, calm, comfortable Vitals:   03/10/21 2236 03/10/21 2345 03/11/21 0050 03/11/21 0107  BP:  (!) 131/58 (!) 132/59 126/62  Pulse:  85 84 85  Resp:  19 (!) 21 19  Temp:   97.8 F (36.6 C) 97.8 F (36.6 C)  TempSrc:   Oral Oral  SpO2:  100% 100% 100%  Weight: 90.3 kg     Height: 5' 10"  (1.778 m)      General: WDWN, Alert and oriented to self and place.  Eyes: EOMI, PERRL, conjunctivae pale.  Sclera nonicteric HENT:  [place]/AT, external ears normal.  Nares patent without epistasis.  Mucous membranes are moist and pale Neck: Soft, normal range of motion, supple, no masses, Trachea midline Respiratory: Equal breath sounds with diffuse rales and bibasilar crackles no wheezing. Normal respiratory effort. No accessory muscle use.  Cardiovascular: Regular rate and rhythm, no murmurs / rubs / gallops. Has lower extremity edema.  Abdomen: Soft, no tenderness, nondistended, no rebound or guarding.  No masses palpated. Bowel sounds  normoactive Musculoskeletal: FROM. no cyanosis. No joint deformity upper and lower extremities. Normal muscle tone.  Skin: Warm, dry, intact no rashes, lesions, ulcers. No induration Neurologic: CN 2-12 grossly intact.  Normal speech.  Sensation intact, Strength 4/5 in all extremities.   Psychiatric: Normal mood.    Labs on Admission: I have personally reviewed following labs and imaging studies  CBC: Recent Labs  Lab 03/10/21 2256  WBC 12.2*  NEUTROABS 9.6*  HGB 5.7*  HCT 19.2*  MCV 86.9  PLT 407*    Basic Metabolic Panel: Recent Labs  Lab 03/10/21 2256  NA 136  K 4.3  CL 104  CO2 20*  GLUCOSE 228*  BUN 107*  CREATININE 3.31*  CALCIUM 8.8*    GFR: Estimated Creatinine Clearance: 16.4 mL/min (A) (by C-G formula based on SCr of 3.31 mg/dL (H)).  Liver Function Tests: Recent Labs  Lab 03/10/21 2256  AST 23  ALT 23  ALKPHOS 112  BILITOT 0.4  PROT 7.1  ALBUMIN 3.3*    Urine analysis:    Component Value Date/Time   COLORURINE YELLOW 07/19/2015 0829   APPEARANCEUR CLEAR 07/19/2015 0829   LABSPEC 1.015 07/19/2015 0829   PHURINE 6.0 07/19/2015 0829   GLUCOSEU NEGATIVE 07/19/2015 0829   HGBUR NEGATIVE 07/19/2015 0829   BILIRUBINUR NEGATIVE 07/19/2015 0829   KETONESUR NEGATIVE 07/19/2015 0829   PROTEINUR 100 (A) 06/20/2012 0603   UROBILINOGEN 0.2 07/19/2015 0829   NITRITE NEGATIVE 07/19/2015 0829   LEUKOCYTESUR NEGATIVE 07/19/2015 0829    Radiological Exams on Admission: DG Chest 2 View  Result Date: 03/10/2021 CLINICAL DATA:  Shortness of breath, high blood pressure. Hx of CAD, COPD, HTN, ex smoker. EXAM: CHEST - 2 VIEW COMPARISON:  Chest x-ray 06/13/2018 FINDINGS: The heart and mediastinal contours are unchanged. Aortic calcification. Patchy interstitial and airspace opacities. Bilateral trace to small volume pleural effusions. No pneumothorax. No acute osseous abnormality. IMPRESSION: Patchy interstitial and airspace opacities. Associated bilateral trace  to small volume pleural effusions. Findings likely represent pulmonary edema with superimposed infection not excluded. Followup PA and lateral chest X-ray is recommended in 3-4 weeks following therapy to ensure resolution and exclude underlying malignancy. Electronically Signed   By: Iven Finn M.D.   On: 03/10/2021 22:52    EKG: Independently reviewed.  EKG shows normal sinus rhythm with nonspecific ST changes in the lateral leads.  QTc 469  Assessment/Plan Principal Problem:   NSTEMI (non-ST elevated myocardial infarction)  Mr. Strubel is admitted to telemetry floor. Serial troponin levels ordered. Initial troponin over 200.  Placed on heparin infusion with NSTEMI. Daughter reports that cardiology has stated that pt is not a candidate for any cardiac intervention and she does not want to pursue aggressive intervention.  Continue aspirin, isosorbide. NTG as needed for chest pain  Active Problems:   Normocytic normochromic anemia Transfuse 2 units PRBC and monitor CBC. Pt has had to have multiple transfusions over past few months.  Is followed by hematology and had bone marrow biopsy last week and awaiting results.     Essential hypertension Monitor BP and continue current meds    Chronic kidney disease, stage IV (severe) Stable. Monitor renal function with use of lasix for diuresis.      DM (diabetes mellitus), type 2 with renal complications Continue lantus daily. Monitor sugars with meals and bedtime and sliding scale insulin for as needed for glycemic control.  Patient had hemoglobin A1c a few weeks ago so we will not repeat at this time    Chronic heart failure with preserved ejection fraction (HFpEF)  Diuresis with lasix. Monitor daily weights and I&Os.  Obtain echocardiogram in am    Chronic obstructive pulmonary disease  Continue medications with dulera substituted for symbicort due to formulary.     CAD, multiple vessel Chronic, followed by cardiology.     DVT  prophylaxis: Placed on heparin infusion with NSTEMI  Code Status:   DNR  Family Communication:  Diagnosis and plan discussed with patient and his daughter who is at bedside.  Daughter verbalized understanding agrees with plan.  Questions answered.  Further recommendation to follow as clinical indicated Disposition Plan:   Patient is from:  Home  Anticipated DC to:  Home  Anticipated DC date:  Anticipate 2 midnight or longer stay in hospital to treat acute condition  Anticipated DC barriers:   Admission status:  Inpatient   Yevonne Aline Davona Kinoshita MD [facility]  How to contact the [facility] Attending or Consulting provider [place] or covering provider during after hours [place], for this patient?   Check the care team in [facility] and look for a) attending/consulting [facility] provider listed and b) the [facility] team listed Log into [email or web address] and use [place]'s universal password to access. If you do not have the password, please contact the hospital operator. Locate the [facility] provider you are looking for under [facility] and page to a number that you can be directly reached. If you still have difficulty reaching the provider, please page the [facility] (Director on Call) for the Hospitalists listed on amion for  assistance.  03/11/2021, 1:25 AM

## 2021-03-11 NOTE — Progress Notes (Addendum)
PROGRESS NOTE   Clayton [name]  EHU:[id number]    DOB: [birth year]    DOA: 03/10/2021  PCP: Lujean Amel, MD   I have briefly reviewed patients previous medical records in [facility].  Chief Complaint  Patient presents with   Shortness of Breath    Brief Narrative:  87 year old male, lives with his daughter, ambulates with the help of a Rollator, medical history significant for CAD, COPD, CKD stage IV, type II DM, transfusion dependent anemia, HTN, HLD presented to the ED with 1 to 2 days history of worsening dyspnea and high blood sugars in the 270-290 range.  Admitted for symptomatic anemia with hemoglobin of 5.7, elevated troponins in the absence of chest pain, acute on stage IV chronic kidney disease and mild right leg cellulitis.  S/p 2 units PRBC transfusion.  Awaiting 2D echo.   Assessment & Plan:  Principal Problem:   Symptomatic anemia Active Problems:   Essential hypertension   Chronic kidney disease, stage IV (severe) (HCC)   CAD, multiple vessel   Normocytic normochromic anemia   Chronic obstructive pulmonary disease (HCC)   DM (diabetes mellitus), type 2 with renal complications (HCC)   Chronic heart failure with preserved ejection fraction (HFpEF) (HCC)   Elevated troponin   Symptomatic anemia: Being evaluated by oncology/Dr. Chryl Heck.  Could be multifactorial related to CKD, anemia of chronic kidney disease versus others.  No history of overt bleeding.  Never had colonoscopy.  Bone marrow biopsy 9/13 showed mildly hypercellular marrow.  Considering Aranesp at next visit on 10/24, as per daughter's report.  S/p 2 units PRBC transfusion and hemoglobin up from 5.7-7.5.  Follow CBC in AM.  Elevated troponin/multivessel CAD s/p extensive PCI to RCA: HS Troponin 214 > 243.  Suspect demand ischemia due to severe symptomatic anemia and stage IV CKD.  No chest pain.  Follows with Dr. Ellyn Hack.  He has reportedly told daughter that patient is not a candidate for any  aggressive intervention which I agree.  Brilinta had to be discontinued due to bleeding issues.  Daughter does not wish to pursue any further aggressive evaluation except an echo, does not even want Cardiology input which I offered.  Continue statins, aspirin, and Imdur. No beta-blockers due to history of bradycardia and Wenckebach block and no ACEI/ARB due to CKD and hypotension in the past.  Acute on stage IV chronic kidney disease: Follows with Dr. Joelyn Oms, [facility].  Not a hemodialysis candidate.  Baseline creatinine probably in the 2.6-2.8 range.  Presented with creatinine of 3.31, improved to 3.02.  Follow BMP in AM.  Right leg cellulitis: Has some superficial wounds.  Per pharmacy, has tolerated cefdinir in the past and hence will treat with Keflex.  Essential hypertension: Controlled.  Continue Imdur.  Hyperlipidemia: Continue statins.  Type II DM with renal complications: Continue Lantus with SSI.  Follows with outpatient endocrinology.  Acute Chronic diastolic CHF: Some volume overload as evidenced by leg edema but this may be chronic and related to CHF, elevated BNP and pulmonary edema on chest x-ray.  Has already received total of Lasix 60 mg since admission.  Respiratory status improved, on room air.  Hold off on further IV Lasix due to concern for worsening creatinine.  Resume prior home dose of Lasix from tomorrow.  COPD: Without clinical bronchospasm.  Continue home meds  Adult failure to thrive: Multifactorial due to very advanced age, multiple severe chronic medical illnesses.  Daughter/healthcare power of attorney at bedside understands that he is  not a candidate for aggressive intervention such as hemodialysis, cardiac cath etc.  She does not even wish to have a PT evaluation.  She indicates that patient is able to ambulate with the help of Rollator and has not followed for a long time now.  Recommend palliative care consultation for goals of care as outpatient.   Patient is DNR.  Body mass index is 28.55 kg/m.    DVT prophylaxis:   Subcutaneous heparin.   Code Status: DNR Family Communication: Discussed in detail with patient's daughter at bedside, updated care and answered all questions. Disposition:  Status is: Inpatient  Remains inpatient appropriate because:Inpatient level of care appropriate due to severity of illness  Dispo: The patient is from: Home              Anticipated d/c is to: Home.  Hopefully DC tomorrow pending stable hemoglobin, improved/stable creatinine.              Patient currently is not medically stable to d/c.   Difficult to place patient No        Consultants:   None  Procedures:   None  Antimicrobials:    Anti-infectives (From admission, onward)    None         Subjective:  Seen this morning in the ED room along with her daughter at bedside.  Patient reports feeling better, stronger, cough improved.  Denies dyspnea or chest pain.  Daughter states that his right leg has multiple wounds from scratching, some with recent purulent drainage, painful, somewhat red and warm and tender to touch.  Objective:   Vitals:   03/11/21 0930 03/11/21 1000 03/11/21 1030 03/11/21 1330  BP: 132/65 126/61 (!) 125/59 (!) 125/52  Pulse: 89 89 86 84  Resp: (!) _0 Temp:      TempSrc:      SpO2: 96% 96% 95% 96%  Weight:      Height:        General exam: Elderly male, moderately built, frail and chronically ill looking lying comfortably propped up in bed.  Hard of hearing. Respiratory system: Slightly diminished breath sounds in the bases but otherwise clear to auscultation without wheezing, rhonchi or crackles.  No increased work of breathing. Cardiovascular system: S1 & S2 heard, RRR. No JVD, murmurs, rubs, gallops or clicks.  1+ pitting bilateral chronic leg edema.  Telemetry personally reviewed: Sinus rhythm. Gastrointestinal system: Abdomen is nondistended, soft and nontender. No organomegaly or  masses felt. Normal bowel sounds heard. Central nervous system: Alert and oriented x2. No focal neurological deficits. Extremities: Symmetric 5 x 5 power.  Right lower leg with mild erythema, increased warmth, tenderness, multiple clean superficial wounds, a single approximately 2 cm ulcer over right lateral malleolus with sloughy base.  No fluctuation or crepitus. Skin: No rashes, lesions or ulcers Psychiatry: Judgement and insight appear normal. Mood & affect appropriate.     Data Reviewed:   I have personally reviewed following labs and imaging studies   CBC: Recent Labs  Lab 03/10/21 2256 03/11/21 0810  WBC 12.2* 11.8*  NEUTROABS 9.6*  --   HGB 5.7* 7.5*  HCT 19.2* 24.0*  MCV 86.9 85.7  PLT 407* 176    Basic Metabolic Panel: Recent Labs  Lab 03/10/21 2256 03/11/21 0810  NA 136 138  K 4.3 3.8  CL 104 108  CO2 20* 22  GLUCOSE 228* 204*  BUN 107* 97*  CREATININE 3.31* 3.02*  CALCIUM 8.8* 8.6*  Liver Function Tests: Recent Labs  Lab 03/10/21 2256  AST 23  ALT 23  ALKPHOS 112  BILITOT 0.4  PROT 7.1  ALBUMIN 3.3*    CBG: Recent Labs  Lab 03/11/21 0800 03/11/21 1147  GLUCAP 189* 271*    Microbiology Studies:   Recent Results (from the past 240 hour(s))  Resp Panel by RT-PCR (Flu A&B, Covid) Nasopharyngeal Swab     Status: None   Collection Time: 03/10/21 11:23 PM   Specimen: Nasopharyngeal Swab; Nasopharyngeal(NP) swabs in vial transport medium  Result Value Ref Range Status   SARS Coronavirus 2 by RT PCR NEGATIVE NEGATIVE Final    Comment: (NOTE) SARS-CoV-2 target nucleic acids are NOT DETECTED.  The SARS-CoV-2 RNA is generally detectable in upper respiratory specimens during the acute phase of infection. The lowest concentration of SARS-CoV-2 viral copies this assay can detect is 138 copies/mL. A negative result does not preclude SARS-Cov-2 infection and should not be used as the sole basis for treatment or other patient management  decisions. A negative result may occur with  improper specimen collection/handling, submission of specimen other than nasopharyngeal swab, presence of viral mutation(s) within the areas targeted by this assay, and inadequate number of viral copies(<138 copies/mL). A negative result must be combined with clinical observations, patient history, and epidemiological information. The expected result is Negative.  Fact Sheet for Patients:  EntrepreneurPulse.com.au  Fact Sheet for Healthcare Providers:  IncredibleEmployment.be  This test is no t yet approved or cleared by the [place] FDA and  has been authorized for detection and/or diagnosis of SARS-CoV-2 by FDA under an Emergency Use Authorization (EUA). This EUA will remain  in effect (meaning this test can be used) for the duration of the COVID-19 declaration under Section 564(b)(1) of the Act, 21 U.S.C.section 360bbb-3(b)(1), unless the authorization is terminated  or revoked sooner.       Influenza A by PCR NEGATIVE NEGATIVE Final   Influenza B by PCR NEGATIVE NEGATIVE Final    Comment: (NOTE) The Xpert Xpress SARS-CoV-2/FLU/RSV plus assay is intended as an aid in the diagnosis of influenza from Nasopharyngeal swab specimens and should not be used as a sole basis for treatment. Nasal washings and aspirates are unacceptable for Xpert Xpress SARS-CoV-2/FLU/RSV testing.  Fact Sheet for Patients: EntrepreneurPulse.com.au  Fact Sheet for Healthcare Providers: IncredibleEmployment.be  This test is not yet approved or cleared by the [place] FDA and has been authorized for detection and/or diagnosis of SARS-CoV-2 by FDA under an Emergency Use Authorization (EUA). This EUA will remain in effect (meaning this test can be used) for the duration of the COVID-19 declaration under Section 564(b)(1) of the Act, 21 U.S.C. section 360bbb-3(b)(1), unless the  authorization is terminated or revoked.  Performed at [facility], [place] [address] [place], [place] [address]      Radiology Studies:  DG Chest 2 View  Result Date: 03/10/2021 CLINICAL DATA:  Shortness of breath, high blood pressure. Hx of CAD, COPD, HTN, ex smoker. EXAM: CHEST - 2 VIEW COMPARISON:  Chest x-ray 06/13/2018 FINDINGS: The heart and mediastinal contours are unchanged. Aortic calcification. Patchy interstitial and airspace opacities. Bilateral trace to small volume pleural effusions. No pneumothorax. No acute osseous abnormality. IMPRESSION: Patchy interstitial and airspace opacities. Associated bilateral trace to small volume pleural effusions. Findings likely represent pulmonary edema with superimposed infection not excluded. Followup PA and lateral chest X-ray is recommended in 3-4 weeks following therapy to ensure resolution and exclude underlying malignancy. Electronically Signed  By: Iven Finn M.D.   On: 03/10/2021 22:52     Scheduled Meds:    aspirin EC  81 mg Oral Daily   [START ON 03/13/2021] atorvastatin  40 mg Oral Q M,W,F   atorvastatin  40 mg Oral Weekly   finasteride  5 mg Oral Daily   furosemide  40 mg Intravenous Q12H   insulin aspart  0-5 Units Subcutaneous QHS   insulin aspart  0-9 Units Subcutaneous TID WC   insulin glargine-yfgn  16 Units Subcutaneous Daily   isosorbide mononitrate  30 mg Oral Daily   melatonin  10 mg Oral QHS   mometasone-formoterol  2 puff Inhalation BID   oxybutynin  5 mg Oral BID   sodium chloride flush  3 mL Intravenous Q12H    Continuous Infusions:    sodium chloride       LOS: 0 days     Vernell Leep, MD, [place], [facility]. [facility]    To contact the attending provider between 7A-7P or the covering provider during after hours 7P-7A, please log into the web site [email or web address] and access using universal [place] password for that web site. If you do not have the password, please call  the hospital operator.  03/11/2021, 2:31 PM

## 2021-03-11 NOTE — Progress Notes (Signed)
Echocardiogram 2D Echocardiogram has been performed.  Oneal Deputy Leighana Neyman RDCS 03/11/2021, 2:48 PM

## 2021-03-12 DIAGNOSIS — R778 Other specified abnormalities of plasma proteins: Secondary | ICD-10-CM | POA: Diagnosis not present

## 2021-03-12 DIAGNOSIS — D649 Anemia, unspecified: Secondary | ICD-10-CM | POA: Diagnosis not present

## 2021-03-12 DIAGNOSIS — I5033 Acute on chronic diastolic (congestive) heart failure: Secondary | ICD-10-CM

## 2021-03-12 DIAGNOSIS — E1129 Type 2 diabetes mellitus with other diabetic kidney complication: Secondary | ICD-10-CM

## 2021-03-12 DIAGNOSIS — I10 Essential (primary) hypertension: Secondary | ICD-10-CM

## 2021-03-12 LAB — BASIC METABOLIC PANEL
Anion gap: 10 (ref 5–15)
BUN: 96 mg/dL — ABNORMAL HIGH (ref 8–23)
CO2: 22 mmol/L (ref 22–32)
Calcium: 8.9 mg/dL (ref 8.9–10.3)
Chloride: 109 mmol/L (ref 98–111)
Creatinine, Ser: 3.01 mg/dL — ABNORMAL HIGH (ref 0.61–1.24)
GFR, Estimated: 19 mL/min — ABNORMAL LOW (ref >60)
Glucose, Bld: 257 mg/dL — ABNORMAL HIGH (ref 70–99)
Potassium: 4.2 mmol/L (ref 3.5–5.1)
Sodium: 141 mmol/L (ref 135–145)

## 2021-03-12 LAB — GLUCOSE, CAPILLARY
Glucose-Capillary: 243 mg/dL — ABNORMAL HIGH (ref 70–99)
Glucose-Capillary: 290 mg/dL — ABNORMAL HIGH (ref 70–99)

## 2021-03-12 LAB — CBC
HCT: 24 % — ABNORMAL LOW (ref 39.0–52.0)
Hemoglobin: 7.5 g/dL — ABNORMAL LOW (ref 13.0–17.0)
MCH: 26.6 pg (ref 26.0–34.0)
MCHC: 31.3 g/dL (ref 30.0–36.0)
MCV: 85.1 fL (ref 80.0–100.0)
Platelets: 240 10*3/uL (ref 150–400)
RBC: 2.82 MIL/uL — ABNORMAL LOW (ref 4.22–5.81)
RDW: 15.9 % — ABNORMAL HIGH (ref 11.5–15.5)
WBC: 13 10*3/uL — ABNORMAL HIGH (ref 4.0–10.5)
nRBC: 0.5 % — ABNORMAL HIGH (ref 0.0–0.2)

## 2021-03-12 MED ORDER — CEPHALEXIN 500 MG PO CAPS: 500.0000 mg | ORAL_CAPSULE | Freq: Two times a day (BID) | ORAL | 0 refills | Status: AC

## 2021-03-12 NOTE — Discharge Summary (Signed)
Physician Discharge Summary  Clayton [name] YQI:[id number] DOB: [birth year] DOA: 03/10/2021  PCP: Lujean Amel, MD  Admit date: 03/10/2021 Discharge date: 03/12/2021  Admitted From: home with daughter Disposition:  same  Recommendations for Outpatient Follow-up:  Follow up daily weights/ fluid balance/ fluid restrictino F/u Hgb  in 2 wks  Home Health:  none  Discharge Condition:  stable   CODE STATUS: DNR   Diet recommendation:  carb modified, heart healthy with 1500 cc fluid restriction Consultations: none  Procedures/Studies: 2 D ECHO   Discharge Diagnoses:  Principal Problem:   Symptomatic anemia Active Problems:   Elevated troponin  Acute on chronic systolic and diastolic CHF  AKI with underlying CKD stage IV   Essential hypertension   CAD, multiple vessel   Normocytic normochromic anemia   Chronic obstructive pulmonary disease (HCC)   DM (diabetes mellitus), type 2 with renal complications [facility])        Brief Summary: 87 year old male, lives with his daughter, ambulates with the help of a Rollator, medical history significant for CAD, COPD, CKD stage IV, type II DM, transfusion dependent anemia, HTN, HLD presented to the ED with 1 to 2 days history of worsening dyspnea and high blood sugars in the 270-290 range.  Admitted for symptomatic anemia with hemoglobin of 5.7, elevated troponins in the absence of chest pain, acute on stage IV chronic kidney disease and mild right leg cellulitis.  S/p 2 units PRBC transfusion.   Hospital Course:  Symptomatic anemia: Being evaluated by oncology/Dr. Chryl Heck.  Could be multifactorial related to CKD, anemia of chronic kidney disease versus others.  No history of overt bleeding.  Never had colonoscopy.  Bone marrow biopsy 9/13 showed mildly hypercellular marrow.  Considering Aranesp at next visit on 10/24, as per daughter's report.  S/p 2 units PRBC transfusion and hemoglobin up from 5.7-7.5.  Hgb today is stable at 7.5 again- the  patient is asymptomatic.   Acute Chronic diastolic CHF: Some volume overload as evidenced by leg edema but this may be chronic and related to CHF, elevated BNP and pulmonary edema on chest x-ray.  Has already received total of Lasix 60 mg since admission.  Respiratory status improved, on room air.   -continue home dose of Lasix- have advised elevation of legs and TEDS but per his daughter, he does not like to wear TEDS and prefers elevation   Elevated troponin/multivessel CAD s/p extensive PCI to RCA: HS Troponin 214 > 243.  Suspect demand ischemia due to severe symptomatic anemia and stage IV CKD.  No chest pain.  Follows with Dr. Ellyn Hack.  He has reportedly told daughter that patient is not a candidate for any aggressive intervention which I agree.  Brilinta had to be discontinued due to bleeding issues.  Daughter does not wish to pursue any further aggressive evaluation except an echo, does not even want Cardiology input which I offered.  Continue statins, aspirin, and Imdur. No beta-blockers due to history of bradycardia and Wenckebach block and no ACEI/ARB due to CKD and hypotension in the past.   Acute on stage IV chronic kidney disease: Follows with Dr. Joelyn Oms, [facility].  Not a hemodialysis candidate.  Baseline creatinine probably in the 2.6-2.8 range.  Presented with creatinine of 3.31, improved to 3.02.  Follow BMP in AM.   Right leg cellulitis: Has some superficial wounds.  Per pharmacy, has tolerated cefdinir in the past and is thus being treated with Keflex.   Essential hypertension: Controlled.  Continue Imdur.  Hyperlipidemia: Continue statins.   Type II DM with renal complications: Continue Lantus with SSI.  Follows with outpatient endocrinology.  COPD: Without clinical bronchospasm.  Continue home meds   Adult failure to thrive: Multifactorial due to very advanced age, multiple severe chronic medical illnesses.  Daughter/healthcare power of attorney at bedside  understands that he is not a candidate for aggressive intervention such as hemodialysis, cardiac cath etc.  She does not even wish to have a PT evaluation.  She indicates that patient is able to ambulate with the help of Rollator and has not followed for a long time now.  Recommend palliative care consultation for goals of care as outpatient.  Patient is DNR.   Body mass index is 28.55 kg/m.          Discharge Exam: Vitals:   03/11/21 2227 03/12/21 0343  BP: 131/63 135/64  Pulse: 84 84  Resp: 16 18  Temp: 98.3 F (36.8 C) 97.8 F (36.6 C)  SpO2: 98% 97%   Vitals:   03/11/21 1556 03/11/21 2227 03/12/21 0343 03/12/21 0400  BP: (!) 133/59 131/63 135/64   Pulse: 88 84 84   Resp: 18 16 18    Temp: 97.9 F (36.6 C) 98.3 F (36.8 C) 97.8 F (36.6 C)   TempSrc: Oral Oral Oral   SpO2: 97% 98% 97%   Weight:    94.1 kg  Height:        General: Pt is alert, awake, not in acute distress Cardiovascular: RRR, S1/S2 +, no rubs, no gallops Respiratory: CTA bilaterally, no wheezing, no rhonchi Abdominal: Soft, NT, ND, bowel sounds + Extremities: trace pedal edema, no cyanosis   Discharge Instructions  Discharge Instructions     Diet - low sodium heart healthy   Complete by: As directed    Increase activity slowly   Complete by: As directed       Allergies as of 03/12/2021       Reactions   Doxycycline Rash   Levaquin [levofloxacin In D5w]    Caused chest pain and heartburn   Aliskiren Other (See Comments)   Unknown per Daughter    Lisinopril Other (See Comments)   Unknown per Daughter    Losartan Potassium Other (See Comments)   Nitrous Oxide Other (See Comments)   Reaction:  Unknown    Penicillins Other (See Comments)   Reaction:  Unknown Has patient had a PCN reaction causing immediate rash, facial/tongue/throat swelling, SOB or lightheadedness with hypotension: Unsure Has patient had a PCN reaction causing severe rash involving mucus membranes or skin necrosis:  Unsure Has patient had a PCN reaction that required hospitalization Unsure  Has patient had a PCN reaction occurring within the last 10 years: No If all of the above answers are "NO", then may proceed with Cephalosporin use.   Tape Other (See Comments)   Reaction:  Tears pts skin    Diltiazem Rash        Medication List     TAKE these medications    Accu-Chek Softclix Lancets lancets TEST BLOOD SUGAR FOUR TIMES DAILY   albuterol 108 (90 Base) MCG/ACT inhaler Commonly known as: VENTOLIN HFA Inhale 1-2 puffs into the lungs every 6 (six) hours as needed for wheezing or shortness of breath.   aspirin EC 81 MG tablet Take 1 tablet (81 mg total) by mouth daily. Swallow whole.   atorvastatin 40 MG tablet Commonly known as: LIPITOR Take 1 tablet (40 mg total) by mouth daily. What changed:  when to  take this additional instructions   B-D SINGLE USE SWABS REGULAR Pads USE 7 SWABS DAILY AS DIRECTED   budesonide-formoterol 160-4.5 MCG/ACT inhaler Commonly known as: Symbicort Inhale 2 puffs into the lungs 2 (two) times daily.   cephALEXin 500 MG capsule Commonly known as: KEFLEX Take 1 capsule (500 mg total) by mouth 2 (two) times daily for 4 days.   cholecalciferol 1000 units tablet Commonly known as: VITAMIN D Take 2,000 Units by mouth daily.   finasteride 5 MG tablet Commonly known as: PROSCAR TAKE 1 TABLET EVERY DAY   FreeStyle Libre 14 Day Reader Devi 1 each by Does not apply route every 14 (fourteen) days. Use reader to monitor blood sugar continuously with freestyle libre sensor.   FreeStyle Libre 14 Day Sensor Misc USE AS DIRECTED EVERY 14 DAYS TO MONITOR BLOOD SUGAR CONTINUOUSLY   furosemide 20 MG tablet Commonly known as: LASIX Take 1 tablet (20 mg total) by mouth 2 (two) times daily. Take seocnd dose about 12 noon each daily   glucose blood test strip Use as instructed to test blood sugars 4 times daily   insulin aspart 100 UNIT/ML injection Commonly  known as: novoLOG INJECT 7 UNITS SUBCUTANEOUS EVERY MORNING, 6 UNITS AT LUNCH, AND 9 UNITS AT SUPPER   insulin glargine 100 UNIT/ML injection Commonly known as: Lantus Inject 0.16 mLs (16 Units total) into the skin daily.   isosorbide mononitrate 30 MG 24 hr tablet Commonly known as: IMDUR TAKE 1 TABLET BY MOUTH DAILY   melatonin 5 MG Tabs Take 2 tablets by mouth at bedtime.   montelukast 10 MG tablet Commonly known as: SINGULAIR Take 10 mg by mouth at bedtime.   multivitamin with minerals Tabs tablet Take 1 tablet by mouth daily.   nitroGLYCERIN 0.4 MG SL tablet Commonly known as: NITROSTAT Place 1 tablet (0.4 mg total) under the tongue every 5 (five) minutes as needed for chest pain.   omeprazole 20 MG capsule Commonly known as: PRILOSEC Take 1 capsule (20 mg total) by mouth daily.   oxybutynin 5 MG tablet Commonly known as: DITROPAN Take 5 mg by mouth daily.   TRUEplus Insulin Syringe 31G X 5/16" 0.3 ML Misc Generic drug: Insulin Syringe-Needle U-100 USE THREE TIMES DAILY AS NEEDED        Allergies  Allergen Reactions   Doxycycline Rash   Levaquin [Levofloxacin In D5w]     Caused chest pain and heartburn   Aliskiren Other (See Comments)    Unknown per Daughter    Lisinopril Other (See Comments)    Unknown per Daughter    Losartan Potassium Other (See Comments)   Nitrous Oxide Other (See Comments)    Reaction:  Unknown    Penicillins Other (See Comments)    Reaction:  Unknown Has patient had a PCN reaction causing immediate rash, facial/tongue/throat swelling, SOB or lightheadedness with hypotension: Unsure Has patient had a PCN reaction causing severe rash involving mucus membranes or skin necrosis: Unsure Has patient had a PCN reaction that required hospitalization Unsure  Has patient had a PCN reaction occurring within the last 10 years: No If all of the above answers are "NO", then may proceed with Cephalosporin use.   Tape Other (See Comments)     Reaction:  Tears pts skin    Diltiazem Rash      DG Chest 2 View  Result Date: 03/10/2021 CLINICAL DATA:  Shortness of breath, high blood pressure. Hx of CAD, COPD, HTN, ex smoker. EXAM: CHEST - 2 VIEW  COMPARISON:  Chest x-ray 06/13/2018 FINDINGS: The heart and mediastinal contours are unchanged. Aortic calcification. Patchy interstitial and airspace opacities. Bilateral trace to small volume pleural effusions. No pneumothorax. No acute osseous abnormality. IMPRESSION: Patchy interstitial and airspace opacities. Associated bilateral trace to small volume pleural effusions. Findings likely represent pulmonary edema with superimposed infection not excluded. Followup PA and lateral chest X-ray is recommended in 3-4 weeks following therapy to ensure resolution and exclude underlying malignancy. Electronically Signed   By: Iven Finn M.D.   On: 03/10/2021 22:52   CT BIOPSY  Result Date: 02/28/2021 INDICATION: Severe refractory anemia. EXAM: CT GUIDED BONE MARROW ASPIRATION AND CORE BIOPSY MEDICATIONS: None. ANESTHESIA/SEDATION: Local anesthetic was administered. FLUOROSCOPY TIME:  CT dose was not reported. COMPLICATIONS: None immediate. Estimated blood loss: <5 mL PROCEDURE: Informed written consent was obtained from the patient after a thorough discussion of the procedural risks, benefits and alternatives. All questions were addressed. Maximal Sterile Barrier Technique was utilized including caps, mask, sterile gowns, sterile gloves, sterile drape, hand hygiene and skin antiseptic. A timeout was performed prior to the initiation of the procedure. The patient was positioned prone and non-contrast localization CT was performed of the pelvis to demonstrate the iliac marrow spaces. Maximal barrier sterile technique utilized including caps, mask, sterile gowns, sterile gloves, large sterile drape, hand hygiene, and chlorhexidine prep. Under sterile conditions and local anesthesia, an 11 gauge coaxial bone  biopsy needle was advanced into the RIGHT iliac marrow space. Needle position was confirmed with CT imaging. Initially, bone marrow aspiration was performed. Next, the 11 gauge outer cannula was utilized to obtain a RIGHT iliac bone marrow core biopsy. Needle was removed. Hemostasis was obtained with compression. The patient tolerated the procedure well. Samples were prepared with the cytotechnologist. IMPRESSION: Successful CT-guided bone marrow aspiration and biopsy, as above. Michaelle Birks, MD [facility] Radiology Electronically Signed   By: Michaelle Birks M.D.   On: 02/28/2021 17:50   CT BONE MARROW BIOPSY & ASPIRATION  Result Date: 02/28/2021 INDICATION: Severe refractory anemia. EXAM: CT GUIDED BONE MARROW ASPIRATION AND CORE BIOPSY MEDICATIONS: None. ANESTHESIA/SEDATION: Local anesthetic was administered. FLUOROSCOPY TIME:  CT dose was not reported. COMPLICATIONS: None immediate. Estimated blood loss: <5 mL PROCEDURE: Informed written consent was obtained from the patient after a thorough discussion of the procedural risks, benefits and alternatives. All questions were addressed. Maximal Sterile Barrier Technique was utilized including caps, mask, sterile gowns, sterile gloves, sterile drape, hand hygiene and skin antiseptic. A timeout was performed prior to the initiation of the procedure. The patient was positioned prone and non-contrast localization CT was performed of the pelvis to demonstrate the iliac marrow spaces. Maximal barrier sterile technique utilized including caps, mask, sterile gowns, sterile gloves, large sterile drape, hand hygiene, and chlorhexidine prep. Under sterile conditions and local anesthesia, an 11 gauge coaxial bone biopsy needle was advanced into the RIGHT iliac marrow space. Needle position was confirmed with CT imaging. Initially, bone marrow aspiration was performed. Next, the 11 gauge outer cannula was utilized to obtain a  RIGHT iliac bone marrow core biopsy. Needle was removed. Hemostasis was obtained with compression. The patient tolerated the procedure well. Samples were prepared with the cytotechnologist. IMPRESSION: Successful CT-guided bone marrow aspiration and biopsy, as above. Michaelle Birks, MD [facility] Radiology Electronically Signed   By: Michaelle Birks M.D.   On: 02/28/2021 17:50   ECHOCARDIOGRAM COMPLETE  Result Date: 03/11/2021    ECHOCARDIOGRAM REPORT  Patient Name:   Clayton [name] Date of Exam: 03/11/2021 Medical Rec #:  [id number]       Height:       70.0 in Accession #:    [id number]      Weight:       199.0 lb Date of Birth:  [birth year]       BSA:          2.083 m Patient Age:    50 years        BP:           122/48 mmHg Patient Gender: M               HR:           83 bpm. Exam Location:  Inpatient Procedure: 2D Echo, Color Doppler and Cardiac Doppler Indications:    I50.9* Heart failure (unspecified)  History:        Patient has prior history of Echocardiogram examinations, most                 recent 04/21/2018. CHF, CAD, COPD; Risk Factors:Hypertension,                 Diabetes, Dyslipidemia and Sleep Apnea.  Sonographer:    Raquel Sarna Senior RDCS Referring Phys: [place]  1. Left ventricular ejection fraction, by estimation, is 45 to 50%. The left ventricle has mildly decreased function. The left ventricle has no regional wall motion abnormalities. There is mild left ventricular hypertrophy. Left ventricular diastolic parameters are consistent with Grade II diastolic dysfunction (pseudonormalization).  2. Right ventricular systolic function is normal. The right ventricular size is normal. There is severely elevated pulmonary artery systolic pressure. The estimated right ventricular systolic pressure is 44.8 mmHg.  3. Left atrial size was severely dilated.  4. The mitral valve is normal in structure. Mild mitral valve regurgitation. No  evidence of mitral stenosis.  5. The aortic valve is calcified. Aortic valve regurgitation is not visualized. Mild to moderate aortic valve sclerosis/calcification is present, without any evidence of aortic stenosis.  6. The inferior vena cava is dilated in size with <50% respiratory variability, suggesting right atrial pressure of 15 mmHg. FINDINGS  Left Ventricle: Left ventricular ejection fraction, by estimation, is 45 to 50%. The left ventricle has mildly decreased function. The left ventricle has no regional wall motion abnormalities. The left ventricular internal cavity size was normal in size. There is mild left ventricular hypertrophy. Left ventricular diastolic parameters are consistent with Grade II diastolic dysfunction (pseudonormalization).  LV Wall Scoring: The apical septal segment is akinetic. Right Ventricle: The right ventricular size is normal. No increase in right ventricular wall thickness. Right ventricular systolic function is normal. There is severely elevated pulmonary artery systolic pressure. The tricuspid regurgitant velocity is 3.51 m/s, and with an assumed right atrial pressure of 15 mmHg, the estimated right ventricular systolic pressure is 18.5 mmHg. Left Atrium: Left atrial size was severely dilated. Right Atrium: Right atrial size was normal in size. Pericardium: There is no evidence of pericardial effusion. Mitral Valve: The mitral valve is normal in structure. Mild mitral valve regurgitation. No evidence of mitral valve stenosis. Tricuspid Valve: The tricuspid valve is normal in structure. Tricuspid valve regurgitation is mild . No evidence of tricuspid stenosis. Aortic Valve: The aortic valve is calcified. Aortic valve regurgitation is not visualized. Mild to moderate aortic valve sclerosis/calcification is present, without any evidence of aortic stenosis. Pulmonic Valve: The pulmonic valve was normal  in structure. Pulmonic valve regurgitation is not visualized. No evidence of  pulmonic stenosis. Aorta: The aortic root is normal in size and structure. Venous: The inferior vena cava is dilated in size with less than 50% respiratory variability, suggesting right atrial pressure of 15 mmHg. IAS/Shunts: No atrial level shunt detected by color flow Doppler.  LEFT VENTRICLE PLAX 2D LVIDd:         4.50 cm  Diastology LVIDs:         3.40 cm  LV e' medial:    7.40 cm/s LV PW:         1.20 cm  LV E/e' medial:  14.2 LV IVS:        1.20 cm  LV e' lateral:   6.53 cm/s LVOT diam:     2.20 cm  LV E/e' lateral: 16.1 LV SV:         65 LV SV Index:   31 LVOT Area:     3.80 cm  RIGHT VENTRICLE RV S prime:     10.60 cm/s TAPSE (M-mode): 2.5 cm LEFT ATRIUM              Index       RIGHT ATRIUM           Index LA diam:        4.20 cm  2.02 cm/m  RA Area:     20.10 cm LA Vol (A2C):   95.4 ml  45.80 ml/m RA Volume:   54.00 ml  25.93 ml/m LA Vol (A4C):   115.0 ml 55.21 ml/m LA Biplane Vol: 107.0 ml 51.37 ml/m  AORTIC VALVE LVOT Vmax:   83.60 cm/s LVOT Vmean:  59.900 cm/s LVOT VTI:    0.172 m  AORTA Ao Root diam: 3.50 cm MITRAL VALVE                TRICUSPID VALVE MV Area (PHT): 4.01 cm     TR Peak grad:   49.3 mmHg MV Decel Time: 189 msec     TR Vmax:        351.00 cm/s MV E velocity: 105.00 cm/s                             SHUNTS                             Systemic VTI:  0.17 m                             Systemic Diam: 2.20 cm Candee Furbish MD Electronically signed by Candee Furbish MD Signature Date/Time: 03/11/2021/3:02:59 PM    Final      The results of significant diagnostics from this hospitalization (including imaging, microbiology, ancillary and laboratory) are listed below for reference.     Microbiology: Recent Results (from the past 240 hour(s))  Resp Panel by RT-PCR (Flu A&B, Covid) Nasopharyngeal Swab     Status: None   Collection Time: 03/10/21 11:23 PM   Specimen: Nasopharyngeal Swab; Nasopharyngeal(NP) swabs in vial transport medium  Result Value Ref Range Status   SARS Coronavirus 2  by RT PCR NEGATIVE NEGATIVE Final    Comment: (NOTE) SARS-CoV-2 target nucleic acids are NOT DETECTED.  The SARS-CoV-2 RNA is generally detectable in upper respiratory specimens during the acute phase of infection. The lowest concentration of SARS-CoV-2 viral  copies this assay can detect is 138 copies/mL. A negative result does not preclude SARS-Cov-2 infection and should not be used as the sole basis for treatment or other patient management decisions. A negative result may occur with  improper specimen collection/handling, submission of specimen other than nasopharyngeal swab, presence of viral mutation(s) within the areas targeted by this assay, and inadequate number of viral copies(<138 copies/mL). A negative result must be combined with clinical observations, patient history, and epidemiological information. The expected result is Negative.  Fact Sheet for Patients:  EntrepreneurPulse.com.au  Fact Sheet for Healthcare Providers:  IncredibleEmployment.be  This test is no t yet approved or cleared by the [place] FDA and  has been authorized for detection and/or diagnosis of SARS-CoV-2 by FDA under an Emergency Use Authorization (EUA). This EUA will remain  in effect (meaning this test can be used) for the duration of the COVID-19 declaration under Section 564(b)(1) of the Act, 21 U.S.C.section 360bbb-3(b)(1), unless the authorization is terminated  or revoked sooner.       Influenza A by PCR NEGATIVE NEGATIVE Final   Influenza B by PCR NEGATIVE NEGATIVE Final    Comment: (NOTE) The Xpert Xpress SARS-CoV-2/FLU/RSV plus assay is intended as an aid in the diagnosis of influenza from Nasopharyngeal swab specimens and should not be used as a sole basis for treatment. Nasal washings and aspirates are unacceptable for Xpert Xpress SARS-CoV-2/FLU/RSV testing.  Fact Sheet for Patients: EntrepreneurPulse.com.au  Fact  Sheet for Healthcare Providers: IncredibleEmployment.be  This test is not yet approved or cleared by the [place] FDA and has been authorized for detection and/or diagnosis of SARS-CoV-2 by FDA under an Emergency Use Authorization (EUA). This EUA will remain in effect (meaning this test can be used) for the duration of the COVID-19 declaration under Section 564(b)(1) of the Act, 21 U.S.C. section 360bbb-3(b)(1), unless the authorization is terminated or revoked.  Performed at [facility], [place] [address] [place], [place] [address]      Labs: BNP (last 3 results) Recent Labs    03/10/21 2256  BNP 5,726.2*   Basic Metabolic Panel: Recent Labs  Lab 03/10/21 2256 03/11/21 0810 03/12/21 0322  NA 136 138 141  K 4.3 3.8 4.2  CL 104 108 109  CO2 20* 22 22  GLUCOSE 228* 204* 257*  BUN 107* 97* 96*  CREATININE 3.31* 3.02* 3.01*  CALCIUM 8.8* 8.6* 8.9   Liver Function Tests: Recent Labs  Lab 03/10/21 2256  AST 23  ALT 23  ALKPHOS 112  BILITOT 0.4  PROT 7.1  ALBUMIN 3.3*   No results for input(s): LIPASE, AMYLASE in the last 168 hours. No results for input(s): AMMONIA in the last 168 hours. CBC: Recent Labs  Lab 03/10/21 2256 03/11/21 0810 03/12/21 0322  WBC 12.2* 11.8* 13.0*  NEUTROABS 9.6*  --   --   HGB 5.7* 7.5* 7.5*  HCT 19.2* 24.0* 24.0*  MCV 86.9 85.7 85.1  PLT 407* 327 240   Cardiac Enzymes: No results for input(s): CKTOTAL, CKMB, CKMBINDEX, TROPONINI in the last 168 hours. BNP: Invalid input(s): POCBNP CBG: Recent Labs  Lab 03/11/21 1147 03/11/21 1622 03/11/21 2054 03/12/21 0713 03/12/21 1053  GLUCAP 271* 309* 263* 243* 290*   D-Dimer No results for input(s): DDIMER in the last 72 hours. Hgb A1c No results for input(s): HGBA1C in the last 72 hours. Lipid Profile No results for input(s): CHOL, HDL, LDLCALC, TRIG, CHOLHDL, LDLDIRECT in the last 72 hours. Thyroid function studies No results  for  input(s): TSH, T4TOTAL, T3FREE, THYROIDAB in the last 72 hours.  Invalid input(s): FREET3 Anemia work up No results for input(s): VITAMINB12, FOLATE, FERRITIN, TIBC, IRON, RETICCTPCT in the last 72 hours. Urinalysis    Component Value Date/Time   COLORURINE YELLOW 07/19/2015 0829   APPEARANCEUR CLEAR 07/19/2015 0829   LABSPEC 1.015 07/19/2015 0829   PHURINE 6.0 07/19/2015 0829   GLUCOSEU NEGATIVE 07/19/2015 0829   HGBUR NEGATIVE 07/19/2015 0829   BILIRUBINUR NEGATIVE 07/19/2015 0829   KETONESUR NEGATIVE 07/19/2015 0829   PROTEINUR 100 (A) 06/20/2012 0603   UROBILINOGEN 0.2 07/19/2015 0829   NITRITE NEGATIVE 07/19/2015 0829   LEUKOCYTESUR NEGATIVE 07/19/2015 0829   Sepsis Labs Invalid input(s): PROCALCITONIN,  WBC,  LACTICIDVEN Microbiology Recent Results (from the past 240 hour(s))  Resp Panel by RT-PCR (Flu A&B, Covid) Nasopharyngeal Swab     Status: None   Collection Time: 03/10/21 11:23 PM   Specimen: Nasopharyngeal Swab; Nasopharyngeal(NP) swabs in vial transport medium  Result Value Ref Range Status   SARS Coronavirus 2 by RT PCR NEGATIVE NEGATIVE Final    Comment: (NOTE) SARS-CoV-2 target nucleic acids are NOT DETECTED.  The SARS-CoV-2 RNA is generally detectable in upper respiratory specimens during the acute phase of infection. The lowest concentration of SARS-CoV-2 viral copies this assay can detect is 138 copies/mL. A negative result does not preclude SARS-Cov-2 infection and should not be used as the sole basis for treatment or other patient management decisions. A negative result may occur with  improper specimen collection/handling, submission of specimen other than nasopharyngeal swab, presence of viral mutation(s) within the areas targeted by this assay, and inadequate number of viral copies(<138 copies/mL). A negative result must be combined with clinical observations, patient history, and epidemiological information. The expected result is  Negative.  Fact Sheet for Patients:  EntrepreneurPulse.com.au  Fact Sheet for Healthcare Providers:  IncredibleEmployment.be  This test is no t yet approved or cleared by the [place] FDA and  has been authorized for detection and/or diagnosis of SARS-CoV-2 by FDA under an Emergency Use Authorization (EUA). This EUA will remain  in effect (meaning this test can be used) for the duration of the COVID-19 declaration under Section 564(b)(1) of the Act, 21 U.S.C.section 360bbb-3(b)(1), unless the authorization is terminated  or revoked sooner.       Influenza A by PCR NEGATIVE NEGATIVE Final   Influenza B by PCR NEGATIVE NEGATIVE Final    Comment: (NOTE) The Xpert Xpress SARS-CoV-2/FLU/RSV plus assay is intended as an aid in the diagnosis of influenza from Nasopharyngeal swab specimens and should not be used as a sole basis for treatment. Nasal washings and aspirates are unacceptable for Xpert Xpress SARS-CoV-2/FLU/RSV testing.  Fact Sheet for Patients: EntrepreneurPulse.com.au  Fact Sheet for Healthcare Providers: IncredibleEmployment.be  This test is not yet approved or cleared by the [place] FDA and has been authorized for detection and/or diagnosis of SARS-CoV-2 by FDA under an Emergency Use Authorization (EUA). This EUA will remain in effect (meaning this test can be used) for the duration of the COVID-19 declaration under Section 564(b)(1) of the Act, 21 U.S.C. section 360bbb-3(b)(1), unless the authorization is terminated or revoked.  Performed at [facility], [place] [address] [place], [place] [address]      Time coordinating discharge in minutes: 65  SIGNED:   Debbe Odea, MD  [facility] 03/12/2021, 12:02 PM

## 2021-03-12 NOTE — Progress Notes (Signed)
AVS given to patient and explained at the bedside. Medications and follow up appointments have been explained with pt verbalizing understanding.  

## 2021-03-13 LAB — BPAM RBC
Blood Product Expiration Date: 202210172359
Blood Product Expiration Date: 202210172359
ISSUE DATE / TIME: 202209240042
ISSUE DATE / TIME: 202209240358
Unit Type and Rh: 7300
Unit Type and Rh: 7300

## 2021-03-13 LAB — TYPE AND SCREEN
ABO/RH(D): B POS
Antibody Screen: NEGATIVE
Unit division: 0
Unit division: 0

## 2021-03-15 ENCOUNTER — Encounter: Payer: Self-pay | Admitting: Hematology and Oncology

## 2021-03-15 ENCOUNTER — Other Ambulatory Visit: Payer: Self-pay

## 2021-03-15 ENCOUNTER — Inpatient Hospital Stay: Payer: HMO

## 2021-03-15 ENCOUNTER — Inpatient Hospital Stay: Payer: HMO | Attending: Hematology and Oncology | Admitting: Hematology and Oncology

## 2021-03-15 VITALS — BP 123/66 | HR 83 | Temp 97.5°F | Resp 18 | Ht 70.0 in | Wt 196.6 lb

## 2021-03-15 DIAGNOSIS — Z7951 Long term (current) use of inhaled steroids: Secondary | ICD-10-CM | POA: Insufficient documentation

## 2021-03-15 DIAGNOSIS — N400 Enlarged prostate without lower urinary tract symptoms: Secondary | ICD-10-CM | POA: Insufficient documentation

## 2021-03-15 DIAGNOSIS — E119 Type 2 diabetes mellitus without complications: Secondary | ICD-10-CM | POA: Diagnosis not present

## 2021-03-15 DIAGNOSIS — I110 Hypertensive heart disease with heart failure: Secondary | ICD-10-CM | POA: Insufficient documentation

## 2021-03-15 DIAGNOSIS — L03115 Cellulitis of right lower limb: Secondary | ICD-10-CM | POA: Diagnosis not present

## 2021-03-15 DIAGNOSIS — D649 Anemia, unspecified: Secondary | ICD-10-CM

## 2021-03-15 DIAGNOSIS — Z87891 Personal history of nicotine dependence: Secondary | ICD-10-CM | POA: Diagnosis not present

## 2021-03-15 DIAGNOSIS — N189 Chronic kidney disease, unspecified: Secondary | ICD-10-CM | POA: Diagnosis not present

## 2021-03-15 DIAGNOSIS — D631 Anemia in chronic kidney disease: Secondary | ICD-10-CM | POA: Insufficient documentation

## 2021-03-15 DIAGNOSIS — I252 Old myocardial infarction: Secondary | ICD-10-CM | POA: Diagnosis not present

## 2021-03-15 DIAGNOSIS — Z79899 Other long term (current) drug therapy: Secondary | ICD-10-CM | POA: Insufficient documentation

## 2021-03-15 DIAGNOSIS — N184 Chronic kidney disease, stage 4 (severe): Secondary | ICD-10-CM | POA: Diagnosis not present

## 2021-03-15 DIAGNOSIS — Z7982 Long term (current) use of aspirin: Secondary | ICD-10-CM | POA: Insufficient documentation

## 2021-03-15 DIAGNOSIS — Z794 Long term (current) use of insulin: Secondary | ICD-10-CM | POA: Insufficient documentation

## 2021-03-15 DIAGNOSIS — L03116 Cellulitis of left lower limb: Secondary | ICD-10-CM

## 2021-03-15 DIAGNOSIS — I509 Heart failure, unspecified: Secondary | ICD-10-CM | POA: Insufficient documentation

## 2021-03-15 LAB — CBC WITH DIFFERENTIAL/PLATELET
Abs Immature Granulocytes: 0.04 10*3/uL (ref 0.00–0.07)
Basophils Absolute: 0.1 10*3/uL (ref 0.0–0.1)
Basophils Relative: 1 %
Eosinophils Absolute: 0.8 10*3/uL — ABNORMAL HIGH (ref 0.0–0.5)
Eosinophils Relative: 8 %
HCT: 25.2 % — ABNORMAL LOW (ref 39.0–52.0)
Hemoglobin: 8 g/dL — ABNORMAL LOW (ref 13.0–17.0)
Immature Granulocytes: 0 %
Lymphocytes Relative: 7 %
Lymphs Abs: 0.7 10*3/uL (ref 0.7–4.0)
MCH: 27.1 pg (ref 26.0–34.0)
MCHC: 31.7 g/dL (ref 30.0–36.0)
MCV: 85.4 fL (ref 80.0–100.0)
Monocytes Absolute: 1.3 10*3/uL — ABNORMAL HIGH (ref 0.1–1.0)
Monocytes Relative: 12 %
Neutro Abs: 7.8 10*3/uL — ABNORMAL HIGH (ref 1.7–7.7)
Neutrophils Relative %: 72 %
Platelets: 284 10*3/uL (ref 150–400)
RBC: 2.95 MIL/uL — ABNORMAL LOW (ref 4.22–5.81)
RDW: 16.5 % — ABNORMAL HIGH (ref 11.5–15.5)
WBC: 10.7 10*3/uL — ABNORMAL HIGH (ref 4.0–10.5)
nRBC: 0 % (ref 0.0–0.2)

## 2021-03-15 LAB — BASIC METABOLIC PANEL - CANCER CENTER ONLY
Anion gap: 14 (ref 5–15)
BUN: 85 mg/dL — ABNORMAL HIGH (ref 8–23)
CO2: 20 mmol/L — ABNORMAL LOW (ref 22–32)
Calcium: 9 mg/dL (ref 8.9–10.3)
Chloride: 106 mmol/L (ref 98–111)
Creatinine: 3.02 mg/dL (ref 0.61–1.24)
GFR, Estimated: 19 mL/min — ABNORMAL LOW (ref >60)
Glucose, Bld: 143 mg/dL — ABNORMAL HIGH (ref 70–99)
Potassium: 4.2 mmol/L (ref 3.5–5.1)
Sodium: 140 mmol/L (ref 135–145)

## 2021-03-15 LAB — SAMPLE TO BLOOD BANK

## 2021-03-15 NOTE — Progress Notes (Signed)
[name] CONSULT NOTE  Patient Care Team: Clayton Amel, MD as PCP - General (Family Medicine) Clayton Man, MD as PCP - Cardiology (Cardiology) Clayton Snare, MD as Attending Physician (Endocrinology)  CHIEF COMPLAINTS/PURPOSE OF CONSULTATION:  Anemia, follow up  ASSESSMENT & PLAN:   This is a very pleasant 87 year old male patient who is here with his daughter for severe anemia. He has been receiving periodic blood transfusions and most recently we have discussed about considering erythropoietin injections given his severe refractory anemia.  He also had a bone marrow aspiration and biopsy which showed changes consistent with mild MDS but no overt hematological disorder.  Cytogenetics were normal.  Have called pathology today to add MDS FISH panel as well. We have discussed about starting darbepoetin at an initial dose of 0.45 mcg/kg once every 4 weeks which comes to about 40 mcg every 4 weeks.  If hemoglobin does not increase by more than 1 g/dL after 4 weeks, increase dose by 25%.  Supportive therapy plan placed.  CBC today, consider transfusion if hemoglobin is less than or equal to 7 g/dL.  Continue antibiotics for cellulitis as recommended.  Consider Ace wrapping of lower extremities once cellulitis improves and elevation of the legs. I did discuss with daughter today that we are happy to try the darbepoetin with however if he does not show any response in the next 8 to 12 weeks, there is unlikely benefit with increasing doses.  At that point, have discussed about considering goals of care to keep him comfortable.  They are in agreement with all of the recommendations, daughter will talk to her dad after they get home about long-term goals of care. Return to clinic in 2 weeks.  Thank you for consulting [place] in the care of this patient.  Please do not hesitate to contact us with any additional questions or concerns.  HISTORY OF PRESENTING ILLNESS:   Clayton [name] 87  y.o. male is here because of ongoing anemia.  [name] is a 87 yr old male patient with PMH significant for BPH, CKD, COPD, Clayton, HTN referred to hematology for evaluation of anemia.    Interval History  Patient is here for follow-up with his daughter.   Since last visit patient was admitted with severe anemia, shortness of breath and some cellulitis.  He currently continues on Keflex for cellulitis.  He received 2 units of packed red blood cells. He continues to feel very tired today.   He denies any change in his bowel habits, hematochezia or melena. He has to wear some pads to help with urinary incontinence. Rest of the pertinent 10 point ROS reviewed and negative.  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  Rare occasional PACs and PVCs.  Minimum heart rate 32 bpm at early morning hours.  Maximal heart rate 112 bpm, average 76 bpm.    SURGICAL HISTORY: Past Surgical History:  Procedure Laterality Date   CORONARY STENT INTERVENTION N/A 04/20/2018   Procedure: CORONARY STENT INTERVENTION;  Surgeon: Clayton Man, MD;  Location: [facility] INVASIVE CV [name];;;     CORONARY/GRAFT ACUTE MI REVASCULARIZATION N/A 04/20/2018   Procedure: Coronary/Graft Acute MI Revascularization;  Surgeon: Clayton Man, MD;  Location: [facility] CV [name];  Service: Cardiovascular;  Laterality: N/A;  --> after initial PTCA restoring flow down the RCA, there was diffuse proximal to mid disease treated with a DES SYNERGY 3 X 38 ( 3.5m)   EYE SURGERY     HOLTER MONITOR  05/2018   Sinus rhythm noted with sinus bradycardia.  Also sinus rhythm with 2-1 AV block noted.  Maximum heart rate was sinus tachycardia 123 bpm.  Rare PVCs accelerated idioventricular rhythm noted.  Intermittent Wenckebach block noted.   I & D EXTREMITY Right 08/13/2016   Procedure: IRRIGATION AND DEBRIDEMENT RIGHT ELBOW AND HAND;  Surgeon: Clayton Jakob MD;  Location: [facility];  Service: Orthopedics;  Laterality: Right;   LEFT HEART CATH AND CORONARY ANGIOGRAPHY N/A 04/20/2018   Procedure: LEFT HEART CATH AND CORONARY ANGIOGRAPHY;  Surgeon: Clayton Man MD;  Location: [name]; mRCA 100%&80% (after 55% tapering in pRCA), mLM-ostLAD 40% w/ 85% ostCx, ost-pCx 65% @ Om1 w/ ost 85%. pLAD 60% & 50% after D1 with distal 75%, ostD1 65% & 90% after small side branch.     TRANSTHORACIC ECHOCARDIOGRAM  04/21/2018   EF 50-55%. Posterior Wall HK. Severe RV HK, dilated IVC. Temp wire in place   VASECTOMY     VIDEO BRONCHOSCOPY Bilateral 08/25/2012   Procedure: VIDEO BRONCHOSCOPY WITHOUT FLUORO;  Surgeon: Clayton Males MD;  [place]  Service: Cardiopulmonary;  Laterality: Bilateral;    SOCIAL HISTORY: Social History   Socioeconomic History   Marital status: Widowed    Spouse name: Not on file   Number of children: 4   Years of education: 16   Highest education level: Bachelor's degree (e.g., BA, AB, BS)  Occupational History   Occupation: retired  Tobacco Use   Smoking status: Former    Packs/day: 3.00    Years: 33.00    Pack years: 99.00    Types: Cigarettes    Quit date: 06/19/1973    Years since quitting: 47.7   Smokeless tobacco: Former    Quit date: 09/26/1975  Vaping Use   Vaping Use: Never used  Substance and Sexual Activity   Alcohol use: No   Drug use: No   Sexual activity: Not  Currently  Other Topics Concern   Not on file  Social History Narrative   He is accompanied by his daughter who seems to be quite involved with his care.      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    Social Determinants of Health   Financial Resource Strain: Not on file  Food Insecurity: Not on file  Transportation Needs: Not on file  Physical Activity: Not on file  Stress: Not on file  Social Connections: Not on file  Intimate Partner Violence: Not on file    FAMILY HISTORY: Family History  Problem Relation Age of Onset   Diabetes Mellitus II Other  Hypertension Other    Other Father        complications from strep   CVA Father    Cancer Sister    Other Brother        hip replacement complications     ALLERGIES:  is allergic to doxycycline, levaquin [levofloxacin in d5w], aliskiren, lisinopril, losartan potassium, nitrous oxide, penicillins, tape, and diltiazem.  MEDICATIONS:  Current Outpatient Medications  Medication Sig Dispense Refill   ACCU-CHEK SOFTCLIX LANCETS lancets TEST BLOOD SUGAR FOUR TIMES DAILY 400 each 1   albuterol (PROVENTIL HFA;VENTOLIN HFA) 108 (90 Base) MCG/ACT inhaler Inhale 1-2 puffs into the lungs every 6 (six) hours as needed for wheezing or shortness of breath.     Alcohol Swabs (B-D SINGLE USE SWABS REGULAR) PADS USE 7 SWABS DAILY AS DIRECTED 600 each 0   aspirin EC 81 MG tablet Take 1 tablet (81 mg total) by mouth daily. Swallow whole. 90 tablet 3   atorvastatin (LIPITOR) 40 MG tablet Take 1 tablet (40 mg total) by mouth daily. (Patient taking differently: Take 40 mg by mouth. Mon, Wed, Fri and Sat) 90 tablet 3   budesonide-formoterol (SYMBICORT) 160-4.5 MCG/ACT inhaler Inhale 2 puffs into the lungs 2 (two) times daily. 10.2 g 5   cephALEXin (KEFLEX) 500 MG capsule Take 1 capsule (500 mg total) by mouth 2 (two) times daily  for 4 days. 10 capsule 0   cholecalciferol (VITAMIN D) 1000 units tablet Take 2,000 Units by mouth daily.     Continuous Blood Gluc Receiver (FREESTYLE LIBRE 14 DAY READER) DEVI 1 each by Does not apply route every 14 (fourteen) days. Use reader to monitor blood sugar continuously with freestyle libre sensor. 1 each 0   Continuous Blood Gluc Sensor (FREESTYLE LIBRE 14 DAY SENSOR) MISC USE AS DIRECTED EVERY 14 DAYS TO MONITOR BLOOD SUGAR CONTINUOUSLY 6 each 3   finasteride (PROSCAR) 5 MG tablet TAKE 1 TABLET EVERY DAY (Patient not taking: Reported on 03/11/2021) 90 tablet 1   furosemide (LASIX) 20 MG tablet Take 1 tablet (20 mg total) by mouth 2 (two) times daily. Take seocnd dose about 12 noon each daily 180 tablet 3   glucose blood test strip Use as instructed to test blood sugars 4 times daily 150 each 12   insulin aspart (NOVOLOG) 100 UNIT/ML injection INJECT 7 UNITS SUBCUTANEOUS EVERY MORNING, 6 UNITS AT LUNCH, AND 9 UNITS AT SUPPER 30 mL 3   insulin glargine (LANTUS) 100 UNIT/ML injection Inject 0.16 mLs (16 Units total) into the skin daily. 32 mL 2   isosorbide mononitrate (IMDUR) 30 MG 24 hr tablet TAKE 1 TABLET BY MOUTH DAILY 30 tablet 8   Melatonin 5 MG TABS Take 2 tablets by mouth at bedtime.      montelukast (SINGULAIR) 10 MG tablet Take 10 mg by mouth at bedtime.     Multiple Vitamin (MULTIVITAMIN WITH MINERALS) TABS tablet Take 1 tablet by mouth daily.     nitroGLYCERIN (NITROSTAT) 0.4 MG SL tablet Place 1 tablet (0.4 mg total) under the tongue every 5 (five) minutes as needed for chest pain. 25 tablet 6   omeprazole (PRILOSEC) 20 MG capsule Take 1 capsule (20 mg total) by mouth daily. 30 capsule 3   oxybutynin (DITROPAN) 5 MG tablet Take 5 mg by mouth daily.     TRUEPLUS INSULIN SYRINGE 31G X 5/16" 0.3 ML MISC USE THREE TIMES DAILY AS NEEDED 300 each 2   No current facility-administered medications for this visit.  PHYSICAL EXAMINATION:  ECOG PERFORMANCE STATUS: 0 -  Asymptomatic  Vitals:   03/15/21 0950  BP: 123/66  Pulse: 83  Resp: 18  Temp: (!) 97.5 F (36.4 C)  SpO2: 98%    Filed Weights   03/15/21 0950  Weight: 196 lb 9.6 oz (89.2 kg)    Physical Exam Constitutional:      Appearance: Normal appearance.     Comments: Pale appearance.  HENT:     Head: Normocephalic and atraumatic.  Cardiovascular:     Rate and Rhythm: Normal rate and regular rhythm.  Pulmonary:     Breath sounds: No rales.     Comments: Poor inspiratory effort, cough on deep inspiration could not appreciate any adventitious sounds. Abdominal:     General: Abdomen is flat. Bowel sounds are normal.     Palpations: Abdomen is soft.  Musculoskeletal:     Cervical back: Normal range of motion and neck supple.     Right lower leg: Edema (Some cellulitis like changes) present.     Left lower leg: Edema (Some cellulitis like changes) present.  Skin:    Coloration: Skin is pale.  Neurological:     General: No focal deficit present.     Mental Status: He is alert.  Psychiatric:        Mood and Affect: Mood normal.   LABORATORY DATA:  I have reviewed the data as listed  [name] Results  Component Value Date   WBC 13.0 (H) 03/12/2021   HGB 7.5 (L) 03/12/2021   HCT 24.0 (L) 03/12/2021   MCV 85.1 03/12/2021   PLT 240 03/12/2021     Chemistry      Component Value Date/Time   NA 141 03/12/2021 0322   K 4.2 03/12/2021 0322   CL 109 03/12/2021 0322   CO2 22 03/12/2021 0322   BUN 96 (H) 03/12/2021 0322   CREATININE 3.01 (H) 03/12/2021 0322   CREATININE 2.67 (H) 01/02/2021 1257   GLU 146 03/09/2020 0000      Component Value Date/Time   CALCIUM 8.9 03/12/2021 0322   ALKPHOS 112 03/10/2021 2256   AST 23 03/10/2021 2256   AST 31 01/02/2021 1257   ALT 23 03/10/2021 2256   ALT 24 01/02/2021 1257   BILITOT 0.4 03/10/2021 2256   BILITOT <0.2 (L) 01/02/2021 1257     CBC from today showed a hemoglobin of 5.9, will arrange for 2 units of packed red blood cell  transfusion but on 2 successive days since he had some difficulty tolerating 2 units of blood at once back in July.  RADIOGRAPHIC STUDIES:  I have personally reviewed the radiological images as listed and agreed with the findings in the report.  DG Chest 2 View  Result Date: 03/10/2021 CLINICAL DATA:  Shortness of breath, high blood pressure. Hx of CAD, COPD, HTN, ex smoker. EXAM: CHEST - 2 VIEW COMPARISON:  Chest x-ray 06/13/2018 FINDINGS: The heart and mediastinal contours are unchanged. Aortic calcification. Patchy interstitial and airspace opacities. Bilateral trace to small volume pleural effusions. No pneumothorax. No acute osseous abnormality. IMPRESSION: Patchy interstitial and airspace opacities. Associated bilateral trace to small volume pleural effusions. Findings likely represent pulmonary edema with superimposed infection not excluded. Followup PA and lateral chest X-ray is recommended in 3-4 weeks following therapy to ensure resolution and exclude underlying malignancy. Electronically Signed   By: Iven Finn M.D.   On: 03/10/2021 22:52   CT BIOPSY  Result Date: 02/28/2021 INDICATION: Severe refractory anemia. EXAM: CT GUIDED BONE  MARROW ASPIRATION AND CORE BIOPSY MEDICATIONS: None. ANESTHESIA/SEDATION: Local anesthetic was administered. FLUOROSCOPY TIME:  CT dose was not reported. COMPLICATIONS: None immediate. Estimated blood loss: <5 mL PROCEDURE: Informed written consent was obtained from the patient after a thorough discussion of the procedural risks, benefits and alternatives. All questions were addressed. Maximal Sterile Barrier Technique was utilized including caps, mask, sterile gowns, sterile gloves, sterile drape, hand hygiene and skin antiseptic. A timeout was performed prior to the initiation of the procedure. The patient was positioned prone and non-contrast localization CT was performed of the pelvis to demonstrate the iliac marrow spaces. Maximal barrier sterile  technique utilized including caps, mask, sterile gowns, sterile gloves, large sterile drape, hand hygiene, and chlorhexidine prep. Under sterile conditions and local anesthesia, an 11 gauge coaxial bone biopsy needle was advanced into the RIGHT iliac marrow space. Needle position was confirmed with CT imaging. Initially, bone marrow aspiration was performed. Next, the 11 gauge outer cannula was utilized to obtain a RIGHT iliac bone marrow core biopsy. Needle was removed. Hemostasis was obtained with compression. The patient tolerated the procedure well. Samples were prepared with the cytotechnologist. IMPRESSION: Successful CT-guided bone marrow aspiration and biopsy, as above. Michaelle Birks, MD [facility] Electronically Signed   By: Michaelle Birks M.D.   On: 02/28/2021 17:50   CT BONE MARROW BIOPSY & ASPIRATION  Result Date: 02/28/2021 INDICATION: Severe refractory anemia. EXAM: CT GUIDED BONE MARROW ASPIRATION AND CORE BIOPSY MEDICATIONS: None. ANESTHESIA/SEDATION: Local anesthetic was administered. FLUOROSCOPY TIME:  CT dose was not reported. COMPLICATIONS: None immediate. Estimated blood loss: <5 mL PROCEDURE: Informed written consent was obtained from the patient after a thorough discussion of the procedural risks, benefits and alternatives. All questions were addressed. Maximal Sterile Barrier Technique was utilized including caps, mask, sterile gowns, sterile gloves, sterile drape, hand hygiene and skin antiseptic. A timeout was performed prior to the initiation of the procedure. The patient was positioned prone and non-contrast localization CT was performed of the pelvis to demonstrate the iliac marrow spaces. Maximal barrier sterile technique utilized including caps, mask, sterile gowns, sterile gloves, large sterile drape, hand hygiene, and chlorhexidine prep. Under sterile conditions and local anesthesia, an 11 gauge coaxial bone biopsy needle  was advanced into the RIGHT iliac marrow space. Needle position was confirmed with CT imaging. Initially, bone marrow aspiration was performed. Next, the 11 gauge outer cannula was utilized to obtain a RIGHT iliac bone marrow core biopsy. Needle was removed. Hemostasis was obtained with compression. The patient tolerated the procedure well. Samples were prepared with the cytotechnologist. IMPRESSION: Successful CT-guided bone marrow aspiration and biopsy, as above. Michaelle Birks, MD [facility] Electronically Signed   By: Michaelle Birks M.D.   On: 02/28/2021 17:50   ECHOCARDIOGRAM COMPLETE  Result Date: 03/11/2021    ECHOCARDIOGRAM REPORT   Patient Name:   ANCEL EASLER Date of Exam: 03/11/2021 Medical Rec #:  [id number]       Height:       70.0 in Accession #:    [id number]      Weight:       199.0 lb Date of Birth:  17-Mar-1928       BSA:          2.083 m Patient Age:    87 years        BP:           122/48 mmHg Patient Gender: M  HR:           83 bpm. Exam Location:  Inpatient Procedure: 2D Echo, Color Doppler and Cardiac Doppler Indications:    I50.9* Heart failure (unspecified)  History:        Patient has prior history of Echocardiogram examinations, most                 recent 04/21/2018. CHF, CAD, COPD; Risk Factors:Hypertension,                 Diabetes, Dyslipidemia and Sleep Apnea.  Sonographer:    Raquel Sarna Senior RDCS Referring Phys: [place]  1. Left ventricular ejection fraction, by estimation, is 45 to 50%. The left ventricle has mildly decreased function. The left ventricle has no regional wall motion abnormalities. There is mild left ventricular hypertrophy. Left ventricular diastolic parameters are consistent with Grade II diastolic dysfunction (pseudonormalization).  2. Right ventricular systolic function is normal. The right ventricular size is normal. There is severely elevated pulmonary artery systolic  pressure. The estimated right ventricular systolic pressure is 91.5 mmHg.  3. Left atrial size was severely dilated.  4. The mitral valve is normal in structure. Mild mitral valve regurgitation. No evidence of mitral stenosis.  5. The aortic valve is calcified. Aortic valve regurgitation is not visualized. Mild to moderate aortic valve sclerosis/calcification is present, without any evidence of aortic stenosis.  6. The inferior vena cava is dilated in size with <50% respiratory variability, suggesting right atrial pressure of 15 mmHg. FINDINGS  Left Ventricle: Left ventricular ejection fraction, by estimation, is 45 to 50%. The left ventricle has mildly decreased function. The left ventricle has no regional wall motion abnormalities. The left ventricular internal cavity size was normal in size. There is mild left ventricular hypertrophy. Left ventricular diastolic parameters are consistent with Grade II diastolic dysfunction (pseudonormalization).  LV Wall Scoring: The apical septal segment is akinetic. Right Ventricle: The right ventricular size is normal. No increase in right ventricular wall thickness. Right ventricular systolic function is normal. There is severely elevated pulmonary artery systolic pressure. The tricuspid regurgitant velocity is 3.51 m/s, and with an assumed right atrial pressure of 15 mmHg, the estimated right ventricular systolic pressure is 05.6 mmHg. Left Atrium: Left atrial size was severely dilated. Right Atrium: Right atrial size was normal in size. Pericardium: There is no evidence of pericardial effusion. Mitral Valve: The mitral valve is normal in structure. Mild mitral valve regurgitation. No evidence of mitral valve stenosis. Tricuspid Valve: The tricuspid valve is normal in structure. Tricuspid valve regurgitation is mild . No evidence of tricuspid stenosis. Aortic Valve: The aortic valve is calcified. Aortic valve regurgitation is not visualized. Mild to moderate aortic valve  sclerosis/calcification is present, without any evidence of aortic stenosis. Pulmonic Valve: The pulmonic valve was normal in structure. Pulmonic valve regurgitation is not visualized. No evidence of pulmonic stenosis. Aorta: The aortic root is normal in size and structure. Venous: The inferior vena cava is dilated in size with less than 50% respiratory variability, suggesting right atrial pressure of 15 mmHg. IAS/Shunts: No atrial level shunt detected by color flow Doppler.  LEFT VENTRICLE PLAX 2D LVIDd:         4.50 cm  Diastology LVIDs:         3.40 cm  LV e' medial:    7.40 cm/s LV PW:         1.20 cm  LV E/e' medial:  14.2 LV IVS:  1.20 cm  LV e' lateral:   6.53 cm/s LVOT diam:     2.20 cm  LV E/e' lateral: 16.1 LV SV:         65 LV SV Index:   31 LVOT Area:     3.80 cm  RIGHT VENTRICLE RV S prime:     10.60 cm/s TAPSE (M-mode): 2.5 cm LEFT ATRIUM              Index       RIGHT ATRIUM           Index LA diam:        4.20 cm  2.02 cm/m  RA Area:     20.10 cm LA Vol (A2C):   95.4 ml  45.80 ml/m RA Volume:   54.00 ml  25.93 ml/m LA Vol (A4C):   115.0 ml 55.21 ml/m LA Biplane Vol: 107.0 ml 51.37 ml/m  AORTIC VALVE LVOT Vmax:   83.60 cm/s LVOT Vmean:  59.900 cm/s LVOT VTI:    0.172 m  AORTA Ao Root diam: 3.50 cm MITRAL VALVE                TRICUSPID VALVE MV Area (PHT): 4.01 cm     TR Peak grad:   49.3 mmHg MV Decel Time: 189 msec     TR Vmax:        351.00 cm/s MV E velocity: 105.00 cm/s                             SHUNTS                             Systemic VTI:  0.17 m                             Systemic Diam: 2.20 cm Candee Furbish MD Electronically signed by Candee Furbish MD Signature Date/Time: 03/11/2021/3:02:59 PM    Final      All questions were answered.  I spent 30 minutes in the care of this patient including history and physical, review of records, counseling and coordination of care.   Benay Pike, MD 03/15/2021 9:53 AM

## 2021-03-16 MED ORDER — ISOSORBIDE MONONITRATE ER 30 MG PO TB24: 30.0000 mg | ORAL_TABLET | Freq: Every day | ORAL | 11 refills | Status: AC

## 2021-03-21 ENCOUNTER — Other Ambulatory Visit: Payer: Self-pay

## 2021-03-21 ENCOUNTER — Inpatient Hospital Stay: Payer: HMO | Attending: Hematology and Oncology

## 2021-03-21 ENCOUNTER — Inpatient Hospital Stay: Payer: HMO

## 2021-03-21 VITALS — BP 138/52 | HR 76 | Temp 98.1°F | Resp 16

## 2021-03-21 DIAGNOSIS — Z79899 Other long term (current) drug therapy: Secondary | ICD-10-CM | POA: Diagnosis not present

## 2021-03-21 DIAGNOSIS — D631 Anemia in chronic kidney disease: Secondary | ICD-10-CM | POA: Insufficient documentation

## 2021-03-21 DIAGNOSIS — D649 Anemia, unspecified: Secondary | ICD-10-CM

## 2021-03-21 DIAGNOSIS — I129 Hypertensive chronic kidney disease with stage 1 through stage 4 chronic kidney disease, or unspecified chronic kidney disease: Secondary | ICD-10-CM | POA: Insufficient documentation

## 2021-03-21 DIAGNOSIS — E611 Iron deficiency: Secondary | ICD-10-CM

## 2021-03-21 DIAGNOSIS — N184 Chronic kidney disease, stage 4 (severe): Secondary | ICD-10-CM

## 2021-03-21 LAB — RETICULOCYTES
Immature Retic Fract: 17.5 % — ABNORMAL HIGH (ref 2.3–15.9)
RBC.: 2.73 MIL/uL — ABNORMAL LOW (ref 4.22–5.81)
Retic Count, Absolute: 36.6 10*3/uL (ref 19.0–186.0)
Retic Ct Pct: 1.3 % (ref 0.4–3.1)

## 2021-03-21 LAB — COMPREHENSIVE METABOLIC PANEL
ALT: 21 U/L (ref 0–44)
AST: 26 U/L (ref 15–41)
Albumin: 3.2 g/dL — ABNORMAL LOW (ref 3.5–5.0)
Alkaline Phosphatase: 105 U/L (ref 38–126)
Anion gap: 10 (ref 5–15)
BUN: 81 mg/dL — ABNORMAL HIGH (ref 8–23)
CO2: 25 mmol/L (ref 22–32)
Calcium: 9.3 mg/dL (ref 8.9–10.3)
Chloride: 106 mmol/L (ref 98–111)
Creatinine, Ser: 2.57 mg/dL — ABNORMAL HIGH (ref 0.61–1.24)
GFR, Estimated: 23 mL/min — ABNORMAL LOW (ref >60)
Glucose, Bld: 163 mg/dL — ABNORMAL HIGH (ref 70–99)
Potassium: 3.9 mmol/L (ref 3.5–5.1)
Sodium: 141 mmol/L (ref 135–145)
Total Bilirubin: 0.2 mg/dL — ABNORMAL LOW (ref 0.3–1.2)
Total Protein: 6.6 g/dL (ref 6.5–8.1)

## 2021-03-21 LAB — CBC WITH DIFFERENTIAL (CANCER CENTER ONLY)
Abs Immature Granulocytes: 0.04 10*3/uL (ref 0.00–0.07)
Basophils Absolute: 0.1 10*3/uL (ref 0.0–0.1)
Basophils Relative: 1 %
Eosinophils Absolute: 0.7 10*3/uL — ABNORMAL HIGH (ref 0.0–0.5)
Eosinophils Relative: 6 %
HCT: 23.8 % — ABNORMAL LOW (ref 39.0–52.0)
Hemoglobin: 7.1 g/dL — ABNORMAL LOW (ref 13.0–17.0)
Immature Granulocytes: 0 %
Lymphocytes Relative: 8 %
Lymphs Abs: 0.8 10*3/uL (ref 0.7–4.0)
MCH: 25.7 pg — ABNORMAL LOW (ref 26.0–34.0)
MCHC: 29.8 g/dL — ABNORMAL LOW (ref 30.0–36.0)
MCV: 86.2 fL (ref 80.0–100.0)
Monocytes Absolute: 1.2 10*3/uL — ABNORMAL HIGH (ref 0.1–1.0)
Monocytes Relative: 12 %
Neutro Abs: 7.3 10*3/uL (ref 1.7–7.7)
Neutrophils Relative %: 73 %
Platelet Count: 216 10*3/uL (ref 150–400)
RBC: 2.76 MIL/uL — ABNORMAL LOW (ref 4.22–5.81)
RDW: 16.9 % — ABNORMAL HIGH (ref 11.5–15.5)
WBC Count: 10.1 10*3/uL (ref 4.0–10.5)
nRBC: 0 % (ref 0.0–0.2)

## 2021-03-21 LAB — SAMPLE TO BLOOD BANK

## 2021-03-21 MED ORDER — DARBEPOETIN ALFA 40 MCG/0.4ML IJ SOSY
40.0000 ug | PREFILLED_SYRINGE | Freq: Once | INTRAMUSCULAR | Status: AC
  Administered 2021-03-21: 40 ug via SUBCUTANEOUS
  Filled 2021-03-21: qty 0.4

## 2021-03-21 NOTE — Patient Instructions (Signed)
Darbepoetin Alfa injection What is this medication? DARBEPOETIN ALFA (dar be POE e tin AL fa) helps your body make more red blood cells. It is used to treat anemia caused by chronic kidney failure and chemotherapy. This medicine may be used for other purposes; ask your health care provider or pharmacist if you have questions. COMMON BRAND NAME(S): Aranesp What should I tell my care team before I take this medication? They need to know if you have any of these conditions: blood clotting disorders or history of blood clots cancer patient not on chemotherapy cystic fibrosis heart disease, such as angina, heart failure, or a history of a heart attack hemoglobin level of 12 g/dL or greater high blood pressure low levels of folate, iron, or vitamin B12 seizures an unusual or allergic reaction to darbepoetin, erythropoietin, albumin, hamster proteins, latex, other medicines, foods, dyes, or preservatives pregnant or trying to get pregnant breast-feeding How should I use this medication? This medicine is for injection into a vein or under the skin. It is usually given by a health care professional in a hospital or clinic setting. If you get this medicine at home, you will be taught how to prepare and give this medicine. Use exactly as directed. Take your medicine at regular intervals. Do not take your medicine more often than directed. It is important that you put your used needles and syringes in a special sharps container. Do not put them in a trash can. If you do not have a sharps container, call your pharmacist or healthcare provider to get one. A special MedGuide will be given to you by the pharmacist with each prescription and refill. Be sure to read this information carefully each time. Talk to your pediatrician regarding the use of this medicine in children. While this medicine may be used in children as young as 1 month of age for selected conditions, precautions do apply. Overdosage: If you  think you have taken too much of this medicine contact a poison control center or emergency room at once. NOTE: This medicine is only for you. Do not share this medicine with others. What if I miss a dose? If you miss a dose, take it as soon as you can. If it is almost time for your next dose, take only that dose. Do not take double or extra doses. What may interact with this medication? Do not take this medicine with any of the following medications: epoetin alfa This list may not describe all possible interactions. Give your health care provider a list of all the medicines, herbs, non-prescription drugs, or dietary supplements you use. Also tell them if you smoke, drink alcohol, or use illegal drugs. Some items may interact with your medicine. What should I watch for while using this medication? Your condition will be monitored carefully while you are receiving this medicine. You may need blood work done while you are taking this medicine. This medicine may cause a decrease in vitamin B6. You should make sure that you get enough vitamin B6 while you are taking this medicine. Discuss the foods you eat and the vitamins you take with your health care professional. What side effects may I notice from receiving this medication? Side effects that you should report to your doctor or health care professional as soon as possible: allergic reactions like skin rash, itching or hives, swelling of the face, lips, or tongue breathing problems changes in vision chest pain confusion, trouble speaking or understanding feeling faint or lightheaded, falls high blood pressure   muscle aches or pains pain, swelling, warmth in the leg rapid weight gain severe headaches sudden numbness or weakness of the face, arm or leg trouble walking, dizziness, loss of balance or coordination seizures (convulsions) swelling of the ankles, feet, hands unusually weak or tired Side effects that usually do not require medical  attention (report to your doctor or health care professional if they continue or are bothersome): diarrhea fever, chills (flu-like symptoms) headaches nausea, vomiting redness, stinging, or swelling at site where injected This list may not describe all possible side effects. Call your doctor for medical advice about side effects. You may report side effects to FDA at 1-800-FDA-1088. Where should I keep my medication? Keep out of the reach of children. Store in a refrigerator between 2 and 8 degrees C (36 and 46 degrees F). Do not freeze. Do not shake. Throw away any unused portion if using a single-dose vial. Throw away any unused medicine after the expiration date. NOTE: This sheet is a summary. It may not cover all possible information. If you have questions about this medicine, talk to your doctor, pharmacist, or health care provider.  2022 [employer] (2017-06-19 16:44:20)  

## 2021-03-22 ENCOUNTER — Emergency Department (HOSPITAL_COMMUNITY): Payer: HMO

## 2021-03-22 ENCOUNTER — Emergency Department (HOSPITAL_COMMUNITY)
Admission: EM | Admit: 2021-03-22 | Discharge: 2021-03-22 | Disposition: A | Discharge status: Home / Self Care | Payer: HMO | Attending: Emergency Medicine | Admitting: Emergency Medicine

## 2021-03-22 DIAGNOSIS — R600 Localized edema: Secondary | ICD-10-CM | POA: Insufficient documentation

## 2021-03-22 DIAGNOSIS — J45991 Cough variant asthma: Secondary | ICD-10-CM | POA: Insufficient documentation

## 2021-03-22 DIAGNOSIS — I130 Hypertensive heart and chronic kidney disease with heart failure and stage 1 through stage 4 chronic kidney disease, or unspecified chronic kidney disease: Secondary | ICD-10-CM | POA: Diagnosis not present

## 2021-03-22 DIAGNOSIS — Y92094 Garage of other non-institutional residence as the place of occurrence of the external cause: Secondary | ICD-10-CM | POA: Diagnosis not present

## 2021-03-22 DIAGNOSIS — E1122 Type 2 diabetes mellitus with diabetic chronic kidney disease: Secondary | ICD-10-CM | POA: Diagnosis not present

## 2021-03-22 DIAGNOSIS — Z79899 Other long term (current) drug therapy: Secondary | ICD-10-CM | POA: Diagnosis not present

## 2021-03-22 DIAGNOSIS — S0990XA Unspecified injury of head, initial encounter: Secondary | ICD-10-CM | POA: Diagnosis present

## 2021-03-22 DIAGNOSIS — W07XXXA Fall from chair, initial encounter: Secondary | ICD-10-CM | POA: Diagnosis not present

## 2021-03-22 DIAGNOSIS — Z7951 Long term (current) use of inhaled steroids: Secondary | ICD-10-CM | POA: Diagnosis not present

## 2021-03-22 DIAGNOSIS — Z7982 Long term (current) use of aspirin: Secondary | ICD-10-CM | POA: Insufficient documentation

## 2021-03-22 DIAGNOSIS — J449 Chronic obstructive pulmonary disease, unspecified: Secondary | ICD-10-CM | POA: Diagnosis not present

## 2021-03-22 DIAGNOSIS — N184 Chronic kidney disease, stage 4 (severe): Secondary | ICD-10-CM | POA: Diagnosis not present

## 2021-03-22 DIAGNOSIS — W19XXXA Unspecified fall, initial encounter: Secondary | ICD-10-CM

## 2021-03-22 DIAGNOSIS — I2510 Atherosclerotic heart disease of native coronary artery without angina pectoris: Secondary | ICD-10-CM | POA: Diagnosis not present

## 2021-03-22 DIAGNOSIS — S0101XA Laceration without foreign body of scalp, initial encounter: Secondary | ICD-10-CM | POA: Diagnosis not present

## 2021-03-22 DIAGNOSIS — Z794 Long term (current) use of insulin: Secondary | ICD-10-CM | POA: Insufficient documentation

## 2021-03-22 DIAGNOSIS — I509 Heart failure, unspecified: Secondary | ICD-10-CM | POA: Insufficient documentation

## 2021-03-22 DIAGNOSIS — Z87891 Personal history of nicotine dependence: Secondary | ICD-10-CM | POA: Diagnosis not present

## 2021-03-22 DIAGNOSIS — Z20822 Contact with and (suspected) exposure to covid-19: Secondary | ICD-10-CM | POA: Insufficient documentation

## 2021-03-22 DIAGNOSIS — S20224A Contusion of middle back wall of thorax, initial encounter: Secondary | ICD-10-CM | POA: Insufficient documentation

## 2021-03-22 DIAGNOSIS — Z23 Encounter for immunization: Secondary | ICD-10-CM | POA: Insufficient documentation

## 2021-03-22 LAB — URINALYSIS, ROUTINE W REFLEX MICROSCOPIC
Bacteria, UA: NONE SEEN
Bilirubin Urine: NEGATIVE
Glucose, UA: NEGATIVE mg/dL
Hgb urine dipstick: NEGATIVE
Ketones, ur: NEGATIVE mg/dL
Leukocytes,Ua: NEGATIVE
Nitrite: NEGATIVE
Protein, ur: 30 mg/dL — AB
Specific Gravity, Urine: 1.01 (ref 1.005–1.030)
pH: 5 (ref 5.0–8.0)

## 2021-03-22 LAB — COMPREHENSIVE METABOLIC PANEL
ALT: 21 U/L (ref 0–44)
AST: 27 U/L (ref 15–41)
Albumin: 3.2 g/dL — ABNORMAL LOW (ref 3.5–5.0)
Alkaline Phosphatase: 97 U/L (ref 38–126)
Anion gap: 10 (ref 5–15)
BUN: 90 mg/dL — ABNORMAL HIGH (ref 8–23)
CO2: 28 mmol/L (ref 22–32)
Calcium: 9.4 mg/dL (ref 8.9–10.3)
Chloride: 112 mmol/L — ABNORMAL HIGH (ref 98–111)
Creatinine, Ser: 2.68 mg/dL — ABNORMAL HIGH (ref 0.61–1.24)
GFR, Estimated: 22 mL/min — ABNORMAL LOW (ref >60)
Glucose, Bld: 207 mg/dL — ABNORMAL HIGH (ref 70–99)
Potassium: 4.4 mmol/L (ref 3.5–5.1)
Sodium: 150 mmol/L — ABNORMAL HIGH (ref 135–145)
Total Bilirubin: 0.4 mg/dL (ref 0.3–1.2)
Total Protein: 6.7 g/dL (ref 6.5–8.1)

## 2021-03-22 LAB — PROTIME-INR
INR: 1.1 (ref 0.8–1.2)
Prothrombin Time: 14.1 seconds (ref 11.4–15.2)

## 2021-03-22 LAB — BASIC METABOLIC PANEL
Anion gap: 6 (ref 5–15)
BUN: 94 mg/dL — ABNORMAL HIGH (ref 8–23)
CO2: 27 mmol/L (ref 22–32)
Calcium: 9.3 mg/dL (ref 8.9–10.3)
Chloride: 109 mmol/L (ref 98–111)
Creatinine, Ser: 2.57 mg/dL — ABNORMAL HIGH (ref 0.61–1.24)
GFR, Estimated: 23 mL/min — ABNORMAL LOW (ref >60)
Glucose, Bld: 179 mg/dL — ABNORMAL HIGH (ref 70–99)
Potassium: 4 mmol/L (ref 3.5–5.1)
Sodium: 142 mmol/L (ref 135–145)

## 2021-03-22 LAB — CBC
HCT: 24.4 % — ABNORMAL LOW (ref 39.0–52.0)
Hemoglobin: 7.1 g/dL — ABNORMAL LOW (ref 13.0–17.0)
MCH: 25.7 pg — ABNORMAL LOW (ref 26.0–34.0)
MCHC: 29.1 g/dL — ABNORMAL LOW (ref 30.0–36.0)
MCV: 88.4 fL (ref 80.0–100.0)
Platelets: 218 10*3/uL (ref 150–400)
RBC: 2.76 MIL/uL — ABNORMAL LOW (ref 4.22–5.81)
RDW: 16.9 % — ABNORMAL HIGH (ref 11.5–15.5)
WBC: 12.2 10*3/uL — ABNORMAL HIGH (ref 4.0–10.5)
nRBC: 0 % (ref 0.0–0.2)

## 2021-03-22 LAB — RESP PANEL BY RT-PCR (FLU A&B, COVID) ARPGX2
Influenza A by PCR: NEGATIVE
Influenza B by PCR: NEGATIVE
SARS Coronavirus 2 by RT PCR: NEGATIVE

## 2021-03-22 MED ORDER — TETANUS-DIPHTH-ACELL PERTUSSIS 5-2.5-18.5 LF-MCG/0.5 IM SUSY
0.5000 mL | PREFILLED_SYRINGE | Freq: Once | INTRAMUSCULAR | Status: AC
  Administered 2021-03-22: 0.5 mL via INTRAMUSCULAR
  Filled 2021-03-22: qty 0.5

## 2021-03-22 MED ORDER — LIDOCAINE-EPINEPHRINE-TETRACAINE (LET) TOPICAL GEL
3.0000 mL | Freq: Once | TOPICAL | Status: AC
  Administered 2021-03-22: 3 mL via TOPICAL
  Filled 2021-03-22: qty 3

## 2021-03-22 NOTE — ED Provider Notes (Signed)
[name] DEPT Provider Note   CSN: [id number] Arrival date & time: 03/22/21  1416     History Chief Complaint  Patient presents with   [name]    Clayton [name] is a 87 y.o. male.   Fall Pertinent negatives include no chest pain, no abdominal pain, no headaches and no shortness of breath. Patient presents after a fall.  He sat down in a chair in his garage and fell backwards.  He struck the back of his head on the garage floor.  He was found on the floor by his daughter, who he lives with.  He was awake and alert.  He denies loss of consciousness.  Patient's daughter states that, at baseline, patient would not be able to get up off of the floor due to chronic generalized weakness.  There was a 12 inch puddle of blood on the floor.  Patient had a laceration to his parieto-occipital scalp that had a small amount of oozing.  EMS provided bandaging and cervical collar.  Patient initially complained of some pain in his back.  There was the presence of a new bruise in his thoracic area.  He is not on any blood thinners other than a baby aspirin.  He has had recent anemia with need for transfusions.  Currently, patient denies any discomfort.  At baseline, he has rattling sounds when he breathes and coughs.  He also has ongoing edema in his lower extremities with concern of cellulitis.  He recently finished a course of antibiotics for this.     Past Medical History:  Diagnosis Date   Anemia    BPH (benign prostatic hyperplasia)    CAD (coronary artery disease)    hx of stemi  04-2018    Chronic renal failure    Chronic renal insufficiency    COPD (chronic obstructive pulmonary disease) (HCC)    DDD (degenerative disc disease), lumbar    Diabetes mellitus    Hyperlipidemia    Hyperparathyroidism ([place])    Hypertension    IDA (iron deficiency anemia)    Neuromuscular disorder (HCC)    OSA on CPAP    Osteomyelitis of forearm (HCC)    Osteoporosis     Wenckebach second degree AV block 11/2020   Event monitor showed Darden Amber block intermittently as well as first-degree block.  Rare occasional PACs and PVCs.  Minimum heart rate 32 bpm at early morning hours.  Maximal heart rate 112 bpm, average 76 bpm.    Patient Active Problem List   Diagnosis Date Noted   Anemia in chronic kidney disease 03/15/2021   DM (diabetes mellitus), type 2 with renal complications ([place]) 82/42/3536   Chronic heart failure with preserved ejection fraction (HFpEF) ([place]) 03/11/2021   Symptomatic anemia 03/11/2021   Elevated troponin 03/11/2021   Weight loss, unintentional 09/12/2020   Leukocytosis 09/12/2020   Unsteady gait when walking 12/14/2019   Rhonchi at both lung bases    AV heart block    CAD, multiple vessel    Normocytic normochromic anemia    Chronic obstructive pulmonary disease (HCC)    ST-segment elevation myocardial infarction (STEMI) of inferior wall ([place]) 04/20/2018   Symptomatic bradycardia 04/20/2018   Presence of drug-eluting stent in right coronary artery 04/18/2018   Iron deficiency 10/30/2016   Cellulitis of right hand 08/11/2016   Chronic kidney disease, stage IV (severe) ([place]) 08/07/2016   Type II or unspecified type diabetes mellitus with renal manifestations, uncontrolled(250.42) 02/03/2014   BPH (benign prostatic  hyperplasia) 07/03/2013   Hyperlipidemia associated with type 2 diabetes mellitus ([place]) 04/06/2013   Dyspnea 03/04/2013   Smoking history 07/24/2012   Cough variant asthma 07/24/2012   Solitary pulmonary nodule 06/27/2012   Hypoxia 06/19/2012   Diabetes mellitus ([place]) 06/19/2012   Hard of hearing 06/19/2012   Essential hypertension 05/19/2010   OSA treated with BiPAP 05/19/2010    Past Surgical History:  Procedure Laterality Date   CORONARY STENT INTERVENTION N/A 04/20/2018   Procedure: CORONARY STENT INTERVENTION;  Surgeon: Leonie Man, MD;  Location: [facility];;;     CORONARY/GRAFT ACUTE MI  REVASCULARIZATION N/A 04/20/2018   Procedure: Coronary/Graft Acute MI Revascularization;  Surgeon: Leonie Man, MD;  Location: [facility];  Service: Cardiovascular;  Laterality: N/A; --> after initial PTCA restoring flow down the RCA, there was diffuse proximal to mid disease treated with a DES SYNERGY 3 X 38 ( 3.58mm)   EYE SURGERY     HOLTER MONITOR  05/2018   Sinus rhythm noted with sinus bradycardia.  Also sinus rhythm with 2-1 AV block noted.  Maximum heart rate was sinus tachycardia 123 bpm.  Rare PVCs accelerated idioventricular rhythm noted.  Intermittent Wenckebach block noted.   I & D EXTREMITY Right 08/13/2016   Procedure: IRRIGATION AND DEBRIDEMENT RIGHT ELBOW AND HAND;  Surgeon: Milly Jakob, MD;  Location: [facility];  Service: Orthopedics;  Laterality: Right;   LEFT HEART CATH AND CORONARY ANGIOGRAPHY N/A 04/20/2018   Procedure: LEFT HEART CATH AND CORONARY ANGIOGRAPHY;  Surgeon: Leonie Man, MD;  Location: [facility]; mRCA 100%&80% (after 55% tapering in pRCA), mLM-ostLAD 40% w/ 85% ostCx, ost-pCx 65% @ Om1 w/ ost 85%. pLAD 60% & 50% after D1 with distal 75%, ostD1 65% & 90% after small side [name].     TRANSTHORACIC ECHOCARDIOGRAM  04/21/2018   EF 50-55%. Posterior Wall HK. Severe RV HK, dilated IVC. Temp wire in place   VASECTOMY     VIDEO BRONCHOSCOPY Bilateral 08/25/2012   Procedure: VIDEO BRONCHOSCOPY WITHOUT FLUORO;  Surgeon: Brand Males, MD;  Location: [facility];  Service: Cardiopulmonary;  Laterality: Bilateral;       Family History  Problem Relation Age of Onset   Diabetes Mellitus II Other    Hypertension Other    Other Father        complications from strep   CVA Father    Cancer Sister    Other Brother        hip replacement complications     Social History   Tobacco Use   Smoking status: Former    Packs/day: 3.00    Years: 33.00    Pack years: 99.00    Types: Cigarettes    Quit date: 06/19/1973    Years since quitting: 47.7    Smokeless tobacco: Former    Quit date: 09/26/1975  Vaping Use   Vaping Use: Never used  Substance Use Topics   Alcohol use: No   Drug use: No    Home Medications Prior to Admission medications   Medication Sig Start Date End Date Taking? Authorizing Provider  ACCU-CHEK SOFTCLIX LANCETS lancets TEST BLOOD SUGAR FOUR TIMES DAILY 07/28/18   Elayne Snare, MD  albuterol (PROVENTIL HFA;VENTOLIN HFA) 108 (90 Base) MCG/ACT inhaler Inhale 1-2 puffs into the lungs every 6 (six) hours as needed for wheezing or shortness of breath.    [provider]  Alcohol Swabs (B-D SINGLE USE SWABS REGULAR) PADS USE 7 SWABS DAILY AS DIRECTED  07/15/20   Elayne Snare, MD  aspirin EC 81 MG tablet Take 1 tablet (81 mg total) by mouth daily. Swallow whole. 11/15/20   Leonie Man, MD  atorvastatin (LIPITOR) 40 MG tablet Take 1 tablet (40 mg total) by mouth daily. Patient taking differently: Take 40 mg by mouth. Jory Sims, Fri and Sat 11/15/20 03/11/21  Leonie Man, MD  budesonide-formoterol [facility]) 160-4.5 MCG/ACT inhaler Inhale 2 puffs into the lungs 2 (two) times daily. 07/29/13   Tanda Rockers, MD  cholecalciferol (VITAMIN D) 1000 units tablet Take 2,000 Units by mouth daily.    [provider]  Continuous Blood Gluc Receiver (FREESTYLE LIBRE 14 DAY READER) DEVI 1 each by Does not apply route every 14 (fourteen) days. Use reader to monitor blood sugar continuously with freestyle libre sensor. 08/19/20   Elayne Snare, MD  Continuous Blood Gluc Sensor (FREESTYLE LIBRE 14 DAY SENSOR) MISC USE AS DIRECTED EVERY 14 DAYS TO MONITOR BLOOD SUGAR CONTINUOUSLY 12/26/20   Elayne Snare, MD  finasteride (PROSCAR) 5 MG tablet TAKE 1 TABLET EVERY DAY 04/23/19   Elayne Snare, MD  furosemide (LASIX) 20 MG tablet Take 1 tablet (20 mg total) by mouth 2 (two) times daily. Take seocnd dose about 12 noon each daily 12/14/19   Leonie Man, MD  glucose blood test strip Use as instructed to test blood sugars 4 times  daily 06/09/18   Elayne Snare, MD  insulin aspart (NOVOLOG) 100 UNIT/ML injection INJECT 7 UNITS SUBCUTANEOUS EVERY MORNING, 6 UNITS AT LUNCH, AND 9 UNITS AT SUPPER 09/27/20   Elayne Snare, MD  insulin glargine (LANTUS) 100 UNIT/ML injection Inject 0.16 mLs (16 Units total) into the skin daily. 09/21/20   Elayne Snare, MD  isosorbide mononitrate (IMDUR) 30 MG 24 hr tablet Take 1 tablet (30 mg total) by mouth daily. 03/16/21   Leonie Man, MD  Melatonin 5 MG TABS Take 2 tablets by mouth at bedtime.     [provider]  montelukast (SINGULAIR) 10 MG tablet Take 10 mg by mouth at bedtime. 10/30/19   [provider]  Multiple Vitamin (MULTIVITAMIN WITH MINERALS) TABS tablet Take 1 tablet by mouth daily.    [provider]  nitroGLYCERIN (NITROSTAT) 0.4 MG SL tablet Place 1 tablet (0.4 mg total) under the tongue every 5 (five) minutes as needed for chest pain. 05/21/19 03/11/21  Leonie Man, MD  omeprazole (PRILOSEC) 20 MG capsule Take 1 capsule (20 mg total) by mouth daily. 11/17/18   Doran Stabler, MD  oxybutynin (DITROPAN) 5 MG tablet Take 5 mg by mouth daily. 02/06/19   [provider]  [place] X 5/16" 0.3 ML MISC USE THREE TIMES DAILY AS NEEDED 01/16/21   Elayne Snare, MD    Allergies    Doxycycline, Levaquin [levofloxacin in d5w], Aliskiren, Lisinopril, Losartan potassium, Nitrous oxide, Penicillins, Tape, and Diltiazem  Review of Systems   Review of Systems  Constitutional:  Negative for chills, diaphoresis and fever.  HENT:  Negative for congestion, ear pain, sore throat and trouble swallowing.   Eyes:  Negative for pain and visual disturbance.  Respiratory:  Positive for cough (Chronic). Negative for chest tightness, shortness of breath and wheezing.   Cardiovascular:  Positive for leg swelling (Chronic). Negative for chest pain and palpitations.  Gastrointestinal:  Negative for abdominal pain, diarrhea, nausea and vomiting.   Genitourinary:  Negative for dysuria, flank pain and hematuria.  Musculoskeletal:  Negative for arthralgias, back pain, joint  swelling and neck pain.  Skin:  Positive for wound. Negative for color change and rash.  Neurological:  Negative for dizziness, seizures, syncope, speech difficulty, weakness, light-headedness, numbness and headaches.  Hematological:  Does not bruise/bleed easily.  Psychiatric/Behavioral:  Negative for confusion and decreased concentration.   All other systems reviewed and are negative.  Physical Exam Updated Vital Signs BP 131/61   Pulse 89   Temp 97.6 F (36.4 C) (Oral)   Resp 19   SpO2 92%   Physical Exam Vitals and nursing note reviewed.  Constitutional:      General: He is not in acute distress.    Appearance: He is well-developed and normal weight. He is ill-appearing (Chronically). He is not toxic-appearing or diaphoretic.  HENT:     Head: Normocephalic.     Comments: Hemostatic skin tear to left parietal occipital scalp    Right Ear: External ear normal.     Left Ear: External ear normal.     Nose: Nose normal.     Mouth/Throat:     Mouth: Mucous membranes are moist.     Comments: Chunky white discharge on oral mucosa Eyes:     General: No scleral icterus.    Extraocular Movements: Extraocular movements intact.     Conjunctiva/sclera: Conjunctivae normal.  Neck:     Comments: Cervical collar in place Cardiovascular:     Rate and Rhythm: Normal rate.     Heart sounds: No murmur heard. Pulmonary:     Effort: Pulmonary effort is normal. No respiratory distress.     Breath sounds: No wheezing.     Comments: Transmitted upper airway sounds Chest:     Chest wall: No tenderness.  Abdominal:     Palpations: Abdomen is soft.     Tenderness: There is no abdominal tenderness.  Musculoskeletal:        General: Tenderness (Thoracic back) present. No deformity.     Cervical back: Neck supple. No tenderness.     Right lower leg: Edema present.      Left lower leg: Edema present.     Comments: Patient has chronic BLE edema with some skin ulcerations and recently finished a course of antibiotics for concern of cellulitis  Skin:    General: Skin is warm and dry.     Coloration: Skin is not pale.     Findings: Bruising present.  Neurological:     General: No focal deficit present.     Mental Status: He is alert and oriented to person, place, and time.     Cranial Nerves: No cranial nerve deficit.     Sensory: No sensory deficit.     Motor: No weakness.     Coordination: Coordination normal.  Psychiatric:        Mood and Affect: Mood normal.        Behavior: Behavior normal.    ED Results / Procedures / Treatments   Labs (all labs ordered are listed, but only abnormal results are displayed) Labs Reviewed  COMPREHENSIVE METABOLIC PANEL - Abnormal; Notable for the following components:      Result Value   Sodium 150 (*)    Chloride 112 (*)    Glucose, Bld 207 (*)    BUN 90 (*)    Creatinine, Ser 2.68 (*)    Albumin 3.2 (*)    GFR, Estimated 22 (*)    All other components within normal limits  CBC - Abnormal; Notable for the following components:   WBC 12.2 (*)  RBC 2.76 (*)    Hemoglobin 7.1 (*)    HCT 24.4 (*)    MCH 25.7 (*)    MCHC 29.1 (*)    RDW 16.9 (*)    All other components within normal limits  URINALYSIS, ROUTINE W REFLEX MICROSCOPIC - Abnormal; Notable for the following components:   Color, Urine STRAW (*)    Protein, ur 30 (*)    All other components within normal limits  BASIC METABOLIC PANEL - Abnormal; Notable for the following components:   Glucose, Bld 179 (*)    BUN 94 (*)    Creatinine, Ser 2.57 (*)    GFR, Estimated 23 (*)    All other components within normal limits  RESP PANEL BY RT-PCR (FLU A&B, COVID) [id number]  PROTIME-INR    EKG EKG Interpretation  Date/Time:  Wednesday March 22 2021 16:06:14 EDT Ventricular Rate:  81 PR Interval:    QRS Duration: 93 QT Interval:  516 QTC  Calculation: 600 R Axis:   68 Text Interpretation: Atrial fibrillation Borderline repolarization abnormality Prolonged QT interval Confirmed by Godfrey Pick [phone]) on 03/22/2021 4:16:38 PM  Radiology DG Chest 1 View  Result Date: 03/22/2021 CLINICAL DATA:  Fall from chair.  Head injury. EXAM: CHEST  1 VIEW COMPARISON:  Radiographs 03/10/2021 and 06/13/2018. FINDINGS: 1703 hours. The heart size and mediastinal contours are stable. The pulmonary vasculature is ill-defined, and there are persistent left greater than right pleural effusions with patchy bilateral airspace opacities. Overall appearance is similar to the recent radiographs, although may be slightly worse on the right. No evidence of acute fracture. Telemetry leads overlie the chest. IMPRESSION: Persistent bilateral pleural effusions with pulmonary edema and probable bibasilar atelectasis. Overall findings appears similar to prior radiographs of 2 weeks ago, although may be slightly worse on the right. Electronically Signed   By: Richardean Sale M.D.   On: 03/22/2021 17:25   DG Pelvis 1-2 Views  Result Date: 03/22/2021 CLINICAL DATA:  [place] backwards from chair.  Head injury. EXAM: PELVIS - 1-2 VIEW COMPARISON:  Pelvic CT 08/15/2020. FINDINGS: No evidence of acute pelvic fracture or sacroiliac joint diastasis. There are mild degenerative changes of both hips. Moderate degenerative changes are present within the lower lumbar spine. The soft tissues appear unremarkable. IMPRESSION: No evidence of acute pelvic fracture or dislocation. Electronically Signed   By: Richardean Sale M.D.   On: 03/22/2021 17:27   CT HEAD WO CONTRAST  Result Date: 03/22/2021 CLINICAL DATA:  Fall EXAM: CT HEAD WITHOUT CONTRAST TECHNIQUE: Contiguous axial images were obtained from the base of the skull through the vertex without intravenous contrast. COMPARISON:  09/25/2015 FINDINGS: Brain: No evidence of acute infarction, hemorrhage, cerebral edema, mass, mass effect, or  midline shift. Ventricles and sulci are within normal limits for age. No extra-axial fluid collection. Vascular: No hyperdense vessel or unexpected calcification. Skull: Normal. Negative for fracture or focal lesion. Sinuses/Orbits: No acute finding. Status post bilateral lens replacements. Other: The mastoid air cells are well aerated. IMPRESSION: No acute intracranial process. Electronically Signed   By: Merilyn Baba M.D.   On: 03/22/2021 17:35   CT Chest Wo Contrast  Result Date: 03/22/2021 CLINICAL DATA:  Chest trauma. EXAM: CT CHEST WITHOUT CONTRAST TECHNIQUE: Multidetector CT imaging of the chest was performed following the standard protocol without IV contrast. COMPARISON:  CT chest 12/08/2015. FINDINGS: Cardiovascular: The heart is enlarged. There is no pericardial effusion. Aorta is normal in size. There are atherosclerotic calcifications of the aorta and coronary arteries.  Mediastinum/Nodes: The visualized thyroid gland and esophagus are within normal limits. There are nonenlarged bilateral hilar and mediastinal lymph nodes diffusely. There is no pneumomediastinum or mediastinal hematoma identified. Lungs/Pleura: Small left and moderate right pleural effusions are present. There is no evidence for pneumothorax. There is smooth interlobular septal thickening in both lung apices with central peribronchial wall thickening bilaterally. Atelectasis and small amount of airspace disease is present in the bilateral lower lobes. There is a 7 mm right upper lobe nodule image 8/67 which was not seen on the prior study. Upper Abdomen: There are bilateral renal hypodense lesions which are incompletely characterized on this study. Musculoskeletal: Bilateral gynecomastia. No chest wall hematoma. No acute fracture. There are healed anterior left second and third rib fractures. No acute fractures are seen. Degenerative changes affect the spine. IMPRESSION: 1. Cardiomegaly with bilateral pleural effusions, right  greater than left, and mild interstitial edema. 2. Bilateral lower lobe atelectasis and consolidation. 3. 7 mm right solid pulmonary nodule within the upper lobe. Recommend a non-contrast Chest CT at 6-12 months. If patient is high risk for malignancy, recommend an additional non-contrast Chest CT at 18-24 months; if patient is low risk for malignancy a non-contrast Chest CT at 18-24 months is optional. These guidelines do not apply to immunocompromised patients and patients with cancer. Follow up in patients with significant comorbidities as clinically warranted. For lung cancer screening, adhere to Lung-RADS guidelines. Reference: Radiology. 2017; 284(1):228-43. Aortic Atherosclerosis (ICD10-I70.0). Electronically Signed   By: Ronney Asters M.D.   On: 03/22/2021 17:34   CT CERVICAL SPINE WO CONTRAST  Result Date: 03/22/2021 CLINICAL DATA:  Fall EXAM: CT CERVICAL SPINE WITHOUT CONTRAST CT THORACIC SPINE WITHOUT CONTRAST TECHNIQUE: Multidetector CT imaging of the cervical and thoracic spine was performed without contrast. Multiplanar CT image reconstructions were also generated. COMPARISON:  09/25/2015 CT cervical spine, a 03/02/2010 thoracic CT could not be retrieved. FINDINGS: CT CERVICAL SPINE FINDINGS Alignment: Trace anterolisthesis C3 on C4, unchanged. Otherwise normal alignment. Skull base and vertebrae: No acute fracture, although evaluation of the lower aspect of C2 is somewhat limited by motion artifact Soft tissues and spinal canal: No prevertebral fluid or swelling. No visible canal hematoma. Disc levels: Moderate spinal canal stenosis at C5-C6, secondary to disc osteophyte complex. Multilevel neural foraminal narrowing, which is severe bilaterally at C6-C7. Upper chest: Please see same date CT chest Other: None. CT THORACIC SPINE FINDINGS Alignment: Mild dextrocurvature.  No significant listhesis. Vertebrae: No acute fracture or focal pathologic process in the spine. Paraspinal and other soft  tissues: For findings within the thorax, please see same-day CT chest. Disc levels: Multilevel degenerative changes. Osteophyte formation. No high-grade spinal canal stenosis. IMPRESSION: No acute fracture or traumatic listhesis in the cervical or thoracic spine. Electronically Signed   By: Merilyn Baba M.D.   On: 03/22/2021 17:34   CT T-SPINE NO CHARGE  Result Date: 03/22/2021 CLINICAL DATA:  Fall EXAM: CT CERVICAL SPINE WITHOUT CONTRAST CT THORACIC SPINE WITHOUT CONTRAST TECHNIQUE: Multidetector CT imaging of the cervical and thoracic spine was performed without contrast. Multiplanar CT image reconstructions were also generated. COMPARISON:  09/25/2015 CT cervical spine, a 03/02/2010 thoracic CT could not be retrieved. FINDINGS: CT CERVICAL SPINE FINDINGS Alignment: Trace anterolisthesis C3 on C4, unchanged. Otherwise normal alignment. Skull base and vertebrae: No acute fracture, although evaluation of the lower aspect of C2 is somewhat limited by motion artifact Soft tissues and spinal canal: No prevertebral fluid or swelling. No visible canal hematoma. Disc levels: Moderate spinal canal  stenosis at C5-C6, secondary to disc osteophyte complex. Multilevel neural foraminal narrowing, which is severe bilaterally at C6-C7. Upper chest: Please see same date CT chest Other: None. CT THORACIC SPINE FINDINGS Alignment: Mild dextrocurvature.  No significant listhesis. Vertebrae: No acute fracture or focal pathologic process in the spine. Paraspinal and other soft tissues: For findings within the thorax, please see same-day CT chest. Disc levels: Multilevel degenerative changes. Osteophyte formation. No high-grade spinal canal stenosis. IMPRESSION: No acute fracture or traumatic listhesis in the cervical or thoracic spine. Electronically Signed   By: Merilyn Baba M.D.   On: 03/22/2021 17:34   DG Lumbar Spine 2-3Vclearing  Result Date: 03/22/2021 CLINICAL DATA:  Patient fell backwards in a chair.  Head injury.  EXAM: LIMITED LUMBAR SPINE FOR TRAUMA CLEARING - 2-3 VIEW COMPARISON:  Lumbar spine radiographs 11/20/2004 FINDINGS: AP and cross-table lateral views were obtained. There are 5 lumbar type vertebral bodies. The alignment is stable and near anatomic. There is no evidence of acute fracture or pars defect. Multilevel spondylosis is noted with disc space narrowing and facet hypertrophy, similar to previous study. Aortoiliac atherosclerosis is present. IMPRESSION: No evidence of acute lumbar spine fracture or traumatic subluxation on AP and lateral clearing views. Electronically Signed   By: Richardean Sale M.D.   On: 03/22/2021 17:23    Procedures Procedures   Medications Ordered in ED Medications  Tdap (BOOSTRIX) injection 0.5 mL (0.5 mLs Intramuscular Given 03/22/21 1608)  lidocaine-EPINEPHrine-tetracaine (LET) topical gel (3 mLs Topical Given 03/22/21 2030)    ED Course  I have reviewed the triage vital signs and the nursing notes.  Pertinent labs & imaging results that were available during my care of the patient were reviewed by me and considered in my medical decision making (see chart for details).    MDM Rules/Calculators/A&P                          Patient is a 87 year old male presenting from home following a fall.  Mechanism was described as a sitting in the chair and falling backwards striking the back of his head.  Noted skin tear to scalp and thoracic midline bruising on exam.  Currently, patient denies discomfort and declines analgesia.  Imaging studies ordered to assess for acute injuries.  Patient does have a history of anemia requiring multiple blood transfusions.  Will obtain lab work to assess for underlying metabolic abnormalities and/or worsened anemia.  Initial lab work showed hypernatremia and hyperchloremia.  This is not consistent with patient's prior labs, including for just a couple days ago.  Repeat BMP was ordered for confirmation.  Imaging did not show any acute  intracranial abnormalities, or any acute osseous abnormalities.  Patient's hemoglobin is 7.1, consistent with previous lab work.  Patient's daughter states that he has a blood transfusion scheduled for later this week.  On imaging studies, patient's scalp wound was cleansed and explored.  It appears to be only a mild skin tear.  It remains hemostatic.  Xeroform dressing was applied.  Repeat BMP showed normal sodium and chloride.  During his observation period in the ED, patient continued to deny any symptoms.  Wet cough and rattling breath sounds remained present, which patient's daughter states is baseline for him.  He remained on room air.  Patient's daughter had concerns of the persistent lower extremity swelling, erythema, and wounds that he has had on his RLE.  Patient is being followed by multiple providers for this.  Patient's daughter was advised to treat as venous stasis and utilize compression socks and elevation when possible and to continue to follow-up for reassessments to monitor for any worsening. Patient was discharged in stable condition.  Final Clinical Impression(s) / ED Diagnoses Final diagnoses:  Fall    Rx / DC Orders ED Discharge Orders     None        Godfrey Pick, MD 03/23/21 1153

## 2021-03-22 NOTE — ED Triage Notes (Signed)
Ems brings pt in from home. States pt was attempting to sit down in a chair in the garage. The chair fell backwards when he sat down. Pt hit the back of his head on the garage floor. Denies LOC.

## 2021-03-22 NOTE — ED Notes (Signed)
Pt in imaging

## 2021-03-22 NOTE — ED Notes (Signed)
EDP at the bedside to evaluate.

## 2021-03-24 ENCOUNTER — Inpatient Hospital Stay: Payer: HMO

## 2021-03-24 ENCOUNTER — Other Ambulatory Visit: Payer: Self-pay

## 2021-03-24 DIAGNOSIS — D631 Anemia in chronic kidney disease: Secondary | ICD-10-CM

## 2021-03-24 DIAGNOSIS — I129 Hypertensive chronic kidney disease with stage 1 through stage 4 chronic kidney disease, or unspecified chronic kidney disease: Secondary | ICD-10-CM | POA: Diagnosis not present

## 2021-03-24 DIAGNOSIS — N184 Chronic kidney disease, stage 4 (severe): Secondary | ICD-10-CM

## 2021-03-24 LAB — PREPARE RBC (CROSSMATCH)

## 2021-03-24 MED ORDER — ACETAMINOPHEN 325 MG PO TABS
650.0000 mg | ORAL_TABLET | Freq: Once | ORAL | Status: AC
  Administered 2021-03-24: 650 mg via ORAL
  Filled 2021-03-24: qty 2

## 2021-03-24 MED ORDER — FUROSEMIDE 10 MG/ML IJ SOLN
20.0000 mg | Freq: Once | INTRAMUSCULAR | Status: AC
  Administered 2021-03-24: 20 mg via INTRAVENOUS
  Filled 2021-03-24: qty 2

## 2021-03-24 MED ORDER — SODIUM CHLORIDE 0.9% IV SOLUTION
250.0000 mL | Freq: Once | INTRAVENOUS | Status: AC
  Administered 2021-03-24: 250 mL via INTRAVENOUS

## 2021-03-24 NOTE — Patient Instructions (Addendum)
[facility]   Discharge Instructions: Please follow-up with your cardiologist, Dr. Glenetta Hew.  For any cough symptoms, Dr. Chryl Heck recommends using over-the-counter Mucinex as directed by the manufacturer. Thank you for choosing [facility] to provide your oncology and hematology care.   If you have a lab appointment with the [facility], please go directly to the [facility] and check in at the registration area.   Wear comfortable clothing and clothing appropriate for easy access to any Portacath or PICC line.   We strive to give you quality time with your provider. You may need to reschedule your appointment if you arrive late (15 or more minutes).  Arriving late affects you and other patients whose appointments are after yours.  Also, if you miss three or more appointments without notifying the office, you may be dismissed from the clinic at the provider's discretion.      For prescription refill requests, have your pharmacy contact our office and allow 72 hours for refills to be completed.    Today you received 1 unit of packed red blood cells.    To help prevent nausea and vomiting after your treatment, we encourage you to take your nausea medication as directed.  BELOW ARE SYMPTOMS THAT SHOULD BE REPORTED IMMEDIATELY: *FEVER GREATER THAN 100.4 F (38 C) OR HIGHER *CHILLS OR SWEATING *NAUSEA AND VOMITING THAT IS NOT CONTROLLED WITH YOUR NAUSEA MEDICATION *UNUSUAL SHORTNESS OF BREATH *UNUSUAL BRUISING OR BLEEDING *URINARY PROBLEMS (pain or burning when urinating, or frequent urination) *BOWEL PROBLEMS (unusual diarrhea, constipation, pain near the anus) TENDERNESS IN MOUTH AND THROAT WITH OR WITHOUT PRESENCE OF ULCERS (sore throat, sores in mouth, or a toothache) UNUSUAL RASH, SWELLING OR PAIN  UNUSUAL VAGINAL DISCHARGE OR ITCHING   Items with * indicate a potential emergency and should be followed up as soon as possible or go to  the Emergency Department if any problems should occur.  Please show the CHEMOTHERAPY ALERT CARD or IMMUNOTHERAPY ALERT CARD at check-in to the Emergency Department and triage nurse.  Should you have questions after your visit or need to cancel or reschedule your appointment, please contact [facility]  Dept: [phone]  and follow the prompts.  Office hours are 8:00 a.m. to 4:30 p.m. Monday - Friday. Please note that voicemails left after 4:00 p.m. may not be returned until the following business day.  We are closed weekends and major holidays. You have access to a nurse at all times for urgent questions. Please call the main number to the clinic Dept: [phone] and follow the prompts.   For any non-urgent questions, you may also contact your provider using MyChart. We now offer e-Visits for anyone 41 and older to request care online for non-urgent symptoms. For details visit [email or web address].   Also download the MyChart app! Go to the app store, search "MyChart", open the app, select [facility], and log in with your MyChart username and password.  Due to Covid, a mask is required upon entering the hospital/clinic. If you do not have a mask, one will be given to you upon arrival. For doctor visits, patients may have 1 support person aged 51 or older with them. For treatment visits, patients cannot have anyone with them due to current Covid guidelines and our immunocompromised population.

## 2021-03-25 LAB — SURGICAL PATHOLOGY

## 2021-03-27 ENCOUNTER — Other Ambulatory Visit: Payer: Self-pay

## 2021-03-27 LAB — TYPE AND SCREEN
ABO/RH(D): B POS
Antibody Screen: NEGATIVE
Unit division: 0

## 2021-03-27 LAB — BPAM RBC
Blood Product Expiration Date: 202210122359
ISSUE DATE / TIME: 202210071553
Unit Type and Rh: 1700

## 2021-03-28 ENCOUNTER — Encounter (HOSPITAL_BASED_OUTPATIENT_CLINIC_OR_DEPARTMENT_OTHER): Payer: Self-pay | Admitting: Emergency Medicine

## 2021-03-28 ENCOUNTER — Emergency Department (HOSPITAL_BASED_OUTPATIENT_CLINIC_OR_DEPARTMENT_OTHER): Payer: HMO | Admitting: Radiology

## 2021-03-28 ENCOUNTER — Emergency Department (HOSPITAL_BASED_OUTPATIENT_CLINIC_OR_DEPARTMENT_OTHER): Payer: HMO

## 2021-03-28 ENCOUNTER — Other Ambulatory Visit: Payer: Self-pay

## 2021-03-28 ENCOUNTER — Emergency Department (HOSPITAL_BASED_OUTPATIENT_CLINIC_OR_DEPARTMENT_OTHER)
Admission: EM | Admit: 2021-03-28 | Discharge: 2021-03-28 | Disposition: A | Discharge status: Home / Self Care | Payer: HMO | Attending: Emergency Medicine | Admitting: Emergency Medicine

## 2021-03-28 DIAGNOSIS — E1122 Type 2 diabetes mellitus with diabetic chronic kidney disease: Secondary | ICD-10-CM | POA: Diagnosis not present

## 2021-03-28 DIAGNOSIS — Z79899 Other long term (current) drug therapy: Secondary | ICD-10-CM | POA: Diagnosis not present

## 2021-03-28 DIAGNOSIS — M25552 Pain in left hip: Secondary | ICD-10-CM | POA: Diagnosis present

## 2021-03-28 DIAGNOSIS — N189 Chronic kidney disease, unspecified: Secondary | ICD-10-CM

## 2021-03-28 DIAGNOSIS — I509 Heart failure, unspecified: Secondary | ICD-10-CM | POA: Insufficient documentation

## 2021-03-28 DIAGNOSIS — Z7982 Long term (current) use of aspirin: Secondary | ICD-10-CM | POA: Diagnosis not present

## 2021-03-28 DIAGNOSIS — M779 Enthesopathy, unspecified: Secondary | ICD-10-CM | POA: Diagnosis not present

## 2021-03-28 DIAGNOSIS — Z794 Long term (current) use of insulin: Secondary | ICD-10-CM | POA: Insufficient documentation

## 2021-03-28 DIAGNOSIS — I130 Hypertensive heart and chronic kidney disease with heart failure and stage 1 through stage 4 chronic kidney disease, or unspecified chronic kidney disease: Secondary | ICD-10-CM | POA: Diagnosis not present

## 2021-03-28 DIAGNOSIS — J449 Chronic obstructive pulmonary disease, unspecified: Secondary | ICD-10-CM | POA: Insufficient documentation

## 2021-03-28 DIAGNOSIS — Z87891 Personal history of nicotine dependence: Secondary | ICD-10-CM | POA: Insufficient documentation

## 2021-03-28 DIAGNOSIS — I2510 Atherosclerotic heart disease of native coronary artery without angina pectoris: Secondary | ICD-10-CM | POA: Insufficient documentation

## 2021-03-28 DIAGNOSIS — N184 Chronic kidney disease, stage 4 (severe): Secondary | ICD-10-CM | POA: Insufficient documentation

## 2021-03-28 LAB — CBC
HCT: 30.2 % — ABNORMAL LOW (ref 39.0–52.0)
Hemoglobin: 9.1 g/dL — ABNORMAL LOW (ref 13.0–17.0)
MCH: 25.8 pg — ABNORMAL LOW (ref 26.0–34.0)
MCHC: 30.1 g/dL (ref 30.0–36.0)
MCV: 85.6 fL (ref 80.0–100.0)
Platelets: 396 10*3/uL (ref 150–400)
RBC: 3.53 MIL/uL — ABNORMAL LOW (ref 4.22–5.81)
RDW: 17.7 % — ABNORMAL HIGH (ref 11.5–15.5)
WBC: 12.6 10*3/uL — ABNORMAL HIGH (ref 4.0–10.5)
nRBC: 0 % (ref 0.0–0.2)

## 2021-03-28 LAB — BASIC METABOLIC PANEL
Anion gap: 13 (ref 5–15)
BUN: 83 mg/dL — ABNORMAL HIGH (ref 8–23)
CO2: 23 mmol/L (ref 22–32)
Calcium: 9.3 mg/dL (ref 8.9–10.3)
Chloride: 109 mmol/L (ref 98–111)
Creatinine, Ser: 2.44 mg/dL — ABNORMAL HIGH (ref 0.61–1.24)
GFR, Estimated: 24 mL/min — ABNORMAL LOW (ref >60)
Glucose, Bld: 149 mg/dL — ABNORMAL HIGH (ref 70–99)
Potassium: 3.9 mmol/L (ref 3.5–5.1)
Sodium: 145 mmol/L (ref 135–145)

## 2021-03-28 MED ORDER — OXYCODONE-ACETAMINOPHEN 5-325 MG PO TABS
1.0000 | ORAL_TABLET | Freq: Once | ORAL | Status: AC
  Administered 2021-03-28: 1 via ORAL
  Filled 2021-03-28: qty 1

## 2021-03-28 NOTE — ED Notes (Signed)
[facility] just left, will not accept him, they feel he needs skilled care

## 2021-03-28 NOTE — ED Provider Notes (Signed)
[facility] EMERGENCY DEPT Provider Note   CSN: [id number] Arrival date & time: 03/28/21  8657     History Chief Complaint  Patient presents with   Leg Pain    Clayton [name] is a 87 y.o. male.  HPI Initial history obtained from patient 87 year old male history of anemia, CAD, chronic renal insufficiency, COPD, diabetes, recent fall with head injury and left hip pain presents today with increased pain left hip and inability to walk.  Patient denies any new injury.  He states he has had pain since the original fall.  States pain is in the low back area but points to his left hip.  He denies any headache, head injury, neck pain, chest pain, abdominal pain, nausea, vomiting, diarrhea, fever, chills, lateralized weakness in his lower extremities.    Past Medical History:  Diagnosis Date   Anemia    BPH (benign prostatic hyperplasia)    CAD (coronary artery disease)    hx of stemi  04-2018    Chronic renal failure    Chronic renal insufficiency    COPD (chronic obstructive pulmonary disease) (HCC)    DDD (degenerative disc disease), lumbar    Diabetes mellitus    Hyperlipidemia    Hyperparathyroidism ([place])    Hypertension    IDA (iron deficiency anemia)    Neuromuscular disorder (HCC)    OSA on CPAP    Osteomyelitis of forearm (HCC)    Osteoporosis    Wenckebach second degree AV block 11/2020   Event monitor showed Darden Amber block intermittently as well as first-degree block.  Rare occasional PACs and PVCs.  Minimum heart rate 32 bpm at early morning hours.  Maximal heart rate 112 bpm, average 76 bpm.    Patient Active Problem List   Diagnosis Date Noted   Anemia in chronic kidney disease 03/15/2021   DM (diabetes mellitus), type 2 with renal complications ([place]) 84/69/6295   Chronic heart failure with preserved ejection fraction (HFpEF) ([place]) 03/11/2021   Symptomatic anemia 03/11/2021   Elevated troponin 03/11/2021   Weight loss, unintentional 09/12/2020    Leukocytosis 09/12/2020   Unsteady gait when walking 12/14/2019   Rhonchi at both lung bases    AV heart block    CAD, multiple vessel    Normocytic normochromic anemia    Chronic obstructive pulmonary disease (HCC)    ST-segment elevation myocardial infarction (STEMI) of inferior wall ([place]) 04/20/2018   Symptomatic bradycardia 04/20/2018   Presence of drug-eluting stent in right coronary artery 04/18/2018   Iron deficiency 10/30/2016   Cellulitis of right hand 08/11/2016   Chronic kidney disease, stage IV (severe) ([place]) 08/07/2016   Type II or unspecified type diabetes mellitus with renal manifestations, uncontrolled(250.42) 02/03/2014   BPH (benign prostatic hyperplasia) 07/03/2013   Hyperlipidemia associated with type 2 diabetes mellitus ([place]) 04/06/2013   Dyspnea 03/04/2013   Smoking history 07/24/2012   Cough variant asthma 07/24/2012   Solitary pulmonary nodule 06/27/2012   Hypoxia 06/19/2012   Diabetes mellitus ([place]) 06/19/2012   Hard of hearing 06/19/2012   Essential hypertension 05/19/2010   OSA treated with BiPAP 05/19/2010    Past Surgical History:  Procedure Laterality Date   CORONARY STENT INTERVENTION N/A 04/20/2018   Procedure: CORONARY STENT INTERVENTION;  Surgeon: Leonie Man, MD;  Location: [facility] INVASIVE CV LAB;;;     CORONARY/GRAFT ACUTE MI REVASCULARIZATION N/A 04/20/2018   Procedure: Coronary/Graft Acute MI Revascularization;  Surgeon: Leonie Man, MD;  Location: [facility] CV LAB;  Service: Cardiovascular;  Laterality: N/A; --> after initial PTCA restoring flow down the RCA, there was diffuse proximal to mid disease treated with a DES SYNERGY 3 X 38 ( 3.7mm)   EYE SURGERY     HOLTER MONITOR  05/2018   Sinus rhythm noted with sinus bradycardia.  Also sinus rhythm with 2-1 AV block noted.  Maximum heart rate was sinus tachycardia 123 bpm.  Rare PVCs accelerated idioventricular rhythm noted.  Intermittent Wenckebach block noted.   I & D EXTREMITY Right  08/13/2016   Procedure: IRRIGATION AND DEBRIDEMENT RIGHT ELBOW AND HAND;  Surgeon: Milly Jakob, MD;  Location: [facility] ORS;  Service: Orthopedics;  Laterality: Right;   LEFT HEART CATH AND CORONARY ANGIOGRAPHY N/A 04/20/2018   Procedure: LEFT HEART CATH AND CORONARY ANGIOGRAPHY;  Surgeon: Leonie Man, MD;  Location: [facility] CV LAB; mRCA 100%&80% (after 55% tapering in pRCA), mLM-ostLAD 40% w/ 85% ostCx, ost-pCx 65% @ Om1 w/ ost 85%. pLAD 60% & 50% after D1 with distal 75%, ostD1 65% & 90% after small side branch.     TRANSTHORACIC ECHOCARDIOGRAM  04/21/2018   EF 50-55%. Posterior Wall HK. Severe RV HK, dilated IVC. Temp wire in place   VASECTOMY     VIDEO BRONCHOSCOPY Bilateral 08/25/2012   Procedure: VIDEO BRONCHOSCOPY WITHOUT FLUORO;  Surgeon: Brand Males, MD;  Location: [facility];  Service: Cardiopulmonary;  Laterality: Bilateral;       Family History  Problem Relation Age of Onset   Diabetes Mellitus II Other    Hypertension Other    Other Father        complications from strep   CVA Father    Cancer Sister    Other Brother        hip replacement complications     Social History   Tobacco Use   Smoking status: Former    Packs/day: 3.00    Years: 33.00    Pack years: 99.00    Types: Cigarettes    Quit date: 06/19/1973    Years since quitting: 47.8   Smokeless tobacco: Former    Quit date: 09/26/1975  Vaping Use   Vaping Use: Never used  Substance Use Topics   Alcohol use: No   Drug use: No    Home Medications Prior to Admission medications   Medication Sig Start Date End Date Taking? Authorizing Provider  ACCU-CHEK SOFTCLIX LANCETS lancets TEST BLOOD SUGAR FOUR TIMES DAILY 07/28/18   Elayne Snare, MD  albuterol (PROVENTIL HFA;VENTOLIN HFA) 108 (90 Base) MCG/ACT inhaler Inhale 1-2 puffs into the lungs every 6 (six) hours as needed for wheezing or shortness of breath.    [provider]  Alcohol Swabs (B-D SINGLE USE SWABS REGULAR) PADS USE 7 SWABS  DAILY AS DIRECTED 07/15/20   Elayne Snare, MD  aspirin EC 81 MG tablet Take 1 tablet (81 mg total) by mouth daily. Swallow whole. 11/15/20   Leonie Man, MD  atorvastatin (LIPITOR) 40 MG tablet Take 1 tablet (40 mg total) by mouth daily. Patient taking differently: Take 40 mg by mouth. Jory Sims, Fri and Sat 11/15/20 03/11/21  Leonie Man, MD  budesonide-formoterol [facility]) 160-4.5 MCG/ACT inhaler Inhale 2 puffs into the lungs 2 (two) times daily. 07/29/13   Tanda Rockers, MD  cholecalciferol (VITAMIN D) 1000 units tablet Take 2,000 Units by mouth daily.    [provider]  Continuous Blood Gluc Receiver (FREESTYLE LIBRE 14 DAY READER) DEVI 1 each by Does not apply route every 14 (fourteen) days. Use  reader to monitor blood sugar continuously with freestyle libre sensor. 08/19/20   Elayne Snare, MD  Continuous Blood Gluc Sensor (FREESTYLE LIBRE 14 DAY SENSOR) MISC USE AS DIRECTED EVERY 14 DAYS TO MONITOR BLOOD SUGAR CONTINUOUSLY 12/26/20   Elayne Snare, MD  finasteride (PROSCAR) 5 MG tablet TAKE 1 TABLET EVERY DAY 04/23/19   Elayne Snare, MD  furosemide (LASIX) 20 MG tablet Take 1 tablet (20 mg total) by mouth 2 (two) times daily. Take seocnd dose about 12 noon each daily 12/14/19   Leonie Man, MD  glucose blood test strip Use as instructed to test blood sugars 4 times daily 06/09/18   Elayne Snare, MD  insulin aspart (NOVOLOG) 100 UNIT/ML injection INJECT 7 UNITS SUBCUTANEOUS EVERY MORNING, 6 UNITS AT LUNCH, AND 9 UNITS AT SUPPER 09/27/20   Elayne Snare, MD  insulin glargine (LANTUS) 100 UNIT/ML injection Inject 0.16 mLs (16 Units total) into the skin daily. 09/21/20   Elayne Snare, MD  isosorbide mononitrate (IMDUR) 30 MG 24 hr tablet Take 1 tablet (30 mg total) by mouth daily. 03/16/21   Leonie Man, MD  Melatonin 5 MG TABS Take 2 tablets by mouth at bedtime.     [provider]  montelukast (SINGULAIR) 10 MG tablet Take 10 mg by mouth at bedtime. 10/30/19   [provider]  Multiple Vitamin (MULTIVITAMIN WITH MINERALS) TABS tablet Take 1 tablet by mouth daily.    [provider]  nitroGLYCERIN (NITROSTAT) 0.4 MG SL tablet Place 1 tablet (0.4 mg total) under the tongue every 5 (five) minutes as needed for chest pain. 05/21/19 03/11/21  Leonie Man, MD  omeprazole (PRILOSEC) 20 MG capsule Take 1 capsule (20 mg total) by mouth daily. 11/17/18   Doran Stabler, MD  oxybutynin (DITROPAN) 5 MG tablet Take 5 mg by mouth daily. 02/06/19   [provider]  [place] X 5/16" 0.3 ML MISC USE THREE TIMES DAILY AS NEEDED 01/16/21   Elayne Snare, MD    Allergies    Doxycycline, Levaquin [levofloxacin in d5w], Aliskiren, Lisinopril, Losartan potassium, Nitrous oxide, Penicillins, Tape, and Diltiazem  Review of Systems   Review of Systems  All other systems reviewed and are negative.  Physical Exam Updated Vital Signs BP (!) 139/53 (BP Location: Left Arm)   Pulse 90   Temp 97.9 F (36.6 C) (Oral)   Resp 17   Ht 1.778 m (5\' 10" )   Wt 88.9 kg   SpO2 97%   BMI 28.12 kg/m   Physical Exam Vitals and nursing note reviewed.  Constitutional:      General: He is not in acute distress.    Appearance: Normal appearance. He is not ill-appearing.  HENT:     Head: Normocephalic.     Comments: Healing contusion on occiput    Right Ear: External ear normal.     Ears:     Comments: Hearing aid in left ear    Nose: Nose normal.     Mouth/Throat:     Mouth: Mucous membranes are dry.     Pharynx: Oropharynx is clear.  Eyes:     Pupils: Pupils are equal, round, and reactive to light.  Cardiovascular:     Rate and Rhythm: Normal rate.  Pulmonary:     Effort: Pulmonary effort is normal.  Abdominal:     General: Bowel sounds are normal.     Palpations: Abdomen is soft.     Comments: Well-healing wound mid abdomen  with dressing in place Healed right inguinal with  Musculoskeletal:     Cervical back: Normal range of  motion.     Comments: Tenderness to palpation of her left hip No tenderness palpation over cervical, thoracic or lumbar spine  Skin:    General: Skin is warm.     Capillary Refill: Capillary refill takes less than 2 seconds.  Neurological:     General: No focal deficit present.     Mental Status: He is alert.     Comments: Patient oriented to person and place  Psychiatric:        Mood and Affect: Mood normal.    ED Results / Procedures / Treatments   Labs (all labs ordered are listed, but only abnormal results are displayed) Labs Reviewed  CBC  BASIC METABOLIC PANEL    EKG None  Radiology CT Lumbar Spine Wo Contrast  Result Date: 03/28/2021 CLINICAL DATA:  Low back pain.  Fall. EXAM: CT LUMBAR SPINE WITHOUT CONTRAST TECHNIQUE: Multidetector CT imaging of the lumbar spine was performed without intravenous contrast administration. Multiplanar CT image reconstructions were also generated. COMPARISON:  None. FINDINGS: Segmentation: 5 lumbar type vertebrae Alignment: No traumatic malalignment.  Mild levoscoliosis. Vertebrae: No evidence of fracture or bone lesion. Paraspinal and other soft tissues: Diffuse colonic stool. Moderate distension of the bladder with right-sided diverticulum. Mild fat stranding around the left iliopsoas with possible minimal internal muscular hemorrhage. Disc levels: Disc desiccation with gas containing fissures and bulging especially at L2-3 to L4-5. Multilevel facet osteoarthritis greatest at L4-5 where there is anterolisthesis. Moderate to advanced spinal stenosis at L2-3 to L4-5, greatest at L4-5. Moderate foraminal narrowing on the right at L3-4 and L4-5. IMPRESSION: 1. Strain of the left iliopsoas. 2. No acute fracture. 3. Lumbar spine degeneration with spinal stenosis at L2-3 to L4-5. Electronically Signed   By: Jorje Guild M.D.   On: 03/28/2021 09:54   CT PELVIS WO CONTRAST  Result Date: 03/28/2021 CLINICAL DATA:  Hip trauma, fracture suspected, neg  xray; Pelvic trauma EXAM: CT OF THE LEFT HIP WITHOUT CONTRAST CT OF THE PELVIS WITHOUT CONTRAST TECHNIQUE: Multidetector CT imaging of the pelvis and left hip was performed according to the standard protocol. Multiplanar CT image reconstructions were also generated. COMPARISON:  X-Dayna Geurts 03/28/2021, 03/22/2021.  CT 08/13/2012 FINDINGS: Bones/Joint/Cartilage No acute fracture or diastasis of the pelvis. Mild degenerative changes of the bilateral sacroiliac joints. Pubic symphysis within normal limits. Bilateral hip joints are intact without fracture or dislocation. Mild joint space narrowing with small marginal osteophyte formation of both hips. There may be a trace left hip joint effusion. No evidence of a right hip joint effusion. No evidence of femoral head avascular necrosis. Small benign enchondroma within the intertrochanteric aspect of the left femur, stable since 2014. No suspicious lytic or sclerotic bone lesion. Bilateral greater trochanteric enthesopathic changes. Ligaments Suboptimally assessed by CT. Muscles and Tendons Thickened appearance of the left iliopsoas tendon with ill-defined fluid adjacent to the distal tendon as well as along the anterior aspect near the myotendinous junction (series 6, image 13; series 6, image 58-60). No obvious full-thickness retracted tear of the left iliopsoas tendon. Remaining musculotendinous structures appear within normal limits by CT. Soft tissues No organized fluid collection or hematoma. Mildly enlarged prostate gland. Moderately distended urinary bladder with a small right posterolateral diverticulum. Atherosclerotic calcifications throughout the aortoiliac axis and bilateral outflow. No inguinal lymphadenopathy. Large volume of stool within the visualized colon. No acute findings are seen within the pelvis.  IMPRESSION: 1. No acute osseous abnormality of the pelvis or bilateral hips. 2. Thickened appearance of the left iliopsoas tendon with ill-defined fluid  adjacent to the distal tendon and myotendinous junction. No obvious full-thickness retracted tear of the left iliopsoas tendon by CT. Findings may reflect left iliopsoas tendinosis with possible partial thickness tearing. Adjacent fluid could be reactive/traumatic or reflect a component of bursitis. 3. Mildly enlarged prostate gland. 4. Moderately distended urinary bladder with a small right posterolateral diverticulum. Correlate for outlet obstruction. Aortic Atherosclerosis (ICD10-I70.0). Electronically Signed   By: Davina Poke D.O.   On: 03/28/2021 09:44   CT Hip Left Wo Contrast  Result Date: 03/28/2021 CLINICAL DATA:  Hip trauma, fracture suspected, neg xray; Pelvic trauma EXAM: CT OF THE LEFT HIP WITHOUT CONTRAST CT OF THE PELVIS WITHOUT CONTRAST TECHNIQUE: Multidetector CT imaging of the pelvis and left hip was performed according to the standard protocol. Multiplanar CT image reconstructions were also generated. COMPARISON:  X-Samuele Storey 03/28/2021, 03/22/2021.  CT 08/13/2012 FINDINGS: Bones/Joint/Cartilage No acute fracture or diastasis of the pelvis. Mild degenerative changes of the bilateral sacroiliac joints. Pubic symphysis within normal limits. Bilateral hip joints are intact without fracture or dislocation. Mild joint space narrowing with small marginal osteophyte formation of both hips. There may be a trace left hip joint effusion. No evidence of a right hip joint effusion. No evidence of femoral head avascular necrosis. Small benign enchondroma within the intertrochanteric aspect of the left femur, stable since 2014. No suspicious lytic or sclerotic bone lesion. Bilateral greater trochanteric enthesopathic changes. Ligaments Suboptimally assessed by CT. Muscles and Tendons Thickened appearance of the left iliopsoas tendon with ill-defined fluid adjacent to the distal tendon as well as along the anterior aspect near the myotendinous junction (series 6, image 13; series 6, image 58-60). No obvious  full-thickness retracted tear of the left iliopsoas tendon. Remaining musculotendinous structures appear within normal limits by CT. Soft tissues No organized fluid collection or hematoma. Mildly enlarged prostate gland. Moderately distended urinary bladder with a small right posterolateral diverticulum. Atherosclerotic calcifications throughout the aortoiliac axis and bilateral outflow. No inguinal lymphadenopathy. Large volume of stool within the visualized colon. No acute findings are seen within the pelvis. IMPRESSION: 1. No acute osseous abnormality of the pelvis or bilateral hips. 2. Thickened appearance of the left iliopsoas tendon with ill-defined fluid adjacent to the distal tendon and myotendinous junction. No obvious full-thickness retracted tear of the left iliopsoas tendon by CT. Findings may reflect left iliopsoas tendinosis with possible partial thickness tearing. Adjacent fluid could be reactive/traumatic or reflect a component of bursitis. 3. Mildly enlarged prostate gland. 4. Moderately distended urinary bladder with a small right posterolateral diverticulum. Correlate for outlet obstruction. Aortic Atherosclerosis (ICD10-I70.0). Electronically Signed   By: Davina Poke D.O.   On: 03/28/2021 09:44   DG Knee Complete 4 Views Left  Result Date: 03/28/2021 CLINICAL DATA:  Left knee pain.  Fall 03/22/2020. EXAM: LEFT KNEE - COMPLETE 4 VIEW COMPARISON:  None. FINDINGS: No evidence of fracture, dislocation, or joint effusion. Generalized osteopenia and atherosclerosis. Generalized subcutaneous reticulation. IMPRESSION: Negative for fracture or malalignment. Electronically Signed   By: Jorje Guild M.D.   On: 03/28/2021 08:15   DG Hip Unilat W or Wo Pelvis 2-3 Views Left  Result Date: 03/28/2021 CLINICAL DATA:  Fall with left knee pain. EXAM: DG HIP (WITH OR WITHOUT PELVIS) 3V LEFT COMPARISON:  None. FINDINGS: There is no evidence of hip fracture or dislocation. Generalized osteopenia.  Benign-appearing small sclerotic area in  the left intratrochanteric femur. Atheromatous calcification. IMPRESSION: No acute finding. Electronically Signed   By: Jorje Guild M.D.   On: 03/28/2021 08:13    Procedures Procedures   Medications Ordered in ED Medications  oxyCODONE-acetaminophen (PERCOCET/ROXICET) 5-325 MG per tablet 1 tablet (has no administration in time range)    ED Course  I have reviewed the triage vital signs and the nursing notes.  Pertinent labs & imaging results that were available during my care of the patient were reviewed by me and considered in my medical decision making (see chart for details). Plain imaging repeated and no evidence of fracture noted.  CT obtained shows no evidence of acute bony injury but does show thickened appearance of the left iliopsoas tendon with some fluid that may reflect ileotendinitis which would be consistent with the pain the patient is having.  Labs are currently pending.  Percocet is ordered for pain. Discussed results with patient's daughter.  She advises that patient is having difficulty getting up at all at home.  They are looking at assisted living care facility.  Plan TOC consult. TOC states that patient will need PPD and form filled out for ALF tomorrow.  We are attempting to contact primary care. MDM Rules/Calculators/A&P                           1-difficulty ambulating likely secondary to fall and iliac tendinitis 2-CKD with stable creatinine 2.44 3 anemia of CKD with stable hemoglobin Discussed with daughter and patient. Transitions of care were consulted for home health assistance until placement is available. Wheelchair is ordered for home use They are going from here to the primary care doctor's office to have a PPD and appropriate forms filled out   Final Clinical Impression(s) / ED Diagnoses Final diagnoses:  Tendinitis  Chronic kidney disease, unspecified CKD stage    Rx / DC Orders ED Discharge Orders      None        Pattricia Boss, MD 03/29/21 [phone]

## 2021-03-28 NOTE — Progress Notes (Addendum)
.  Transition of Care [facility]) - Emergency Department Mini Assessment   Patient Details  Name: Clayton [name] MRN: [id number] Date of Birth: 04-02-1928  Transition of Care [facility]) CM/SW Contact:    Illene Regulus, LCSW Phone Number: 03/28/2021, 11:38 AM   Clinical Narrative:  CSW spoke with pt's daughter Angelene Giovanni ([phone]), she stated her dad's health has gotten worse over the past couple of weeks. She stated visiting [facility] and they have availability. Pt's daughter is requesting FL2 and TB skin test to be completed. CSW informed pt's daughter , those items will have to be completed by pt's primary care physician. Pt's daughter requested a hospital bed and wheelchair. CSW informed pt's daughter pt will be d/c back home and [facility] can be set up.   Pt is in need of an wheel chair due to pain when walking and lateralized weakness in his lower extremities which has cause pt to fall in the [place].  CSW contacted lakreshia from [employer] [facility] , she stated pt does not qualify for an hospital bed, CSW informed pt's daughter of this. [employer] will delivered pt's wheel chair to his room. CSW to set up [facility] services for pt.   12:15pm Family member will be picking wheel chair up from [place] .  Arlie Solomons.Sudie Bandel, MSW, [place]  Transitions of Care Clinical Social Worker I Direct Dial: [phone]  Fax: [phone] [email or web address]     ED Mini Assessment: What brought you to the Emergency Department? : pian                Patient Contact and Communications        ,                 Admission diagnosis:  left leg pain Patient Active Problem List   Diagnosis Date Noted   Anemia in chronic kidney disease 03/15/2021   DM (diabetes mellitus), type 2 with renal complications ([place]) 42/70/6237   Chronic heart failure with preserved ejection fraction (HFpEF) ([place]) 03/11/2021   Symptomatic anemia  03/11/2021   Elevated troponin 03/11/2021   Weight loss, unintentional 09/12/2020   Leukocytosis 09/12/2020   Unsteady gait when walking 12/14/2019   Rhonchi at both lung bases    AV heart block    CAD, multiple vessel    Normocytic normochromic anemia    Chronic obstructive pulmonary disease (HCC)    ST-segment elevation myocardial infarction (STEMI) of inferior wall ([place]) 04/20/2018   Symptomatic bradycardia 04/20/2018   Presence of drug-eluting stent in right coronary artery 04/18/2018   Iron deficiency 10/30/2016   Cellulitis of right hand 08/11/2016   Chronic kidney disease, stage IV (severe) ([place]) 08/07/2016   Type II or unspecified type diabetes mellitus with renal manifestations, uncontrolled(250.42) 02/03/2014   BPH (benign prostatic hyperplasia) 07/03/2013   Hyperlipidemia associated with type 2 diabetes mellitus ([place]) 04/06/2013   Dyspnea 03/04/2013   Smoking history 07/24/2012   Cough variant asthma 07/24/2012   Solitary pulmonary nodule 06/27/2012   Hypoxia 06/19/2012   Diabetes mellitus ([place]) 06/19/2012   Hard of hearing 06/19/2012   Essential hypertension 05/19/2010   OSA treated with BiPAP 05/19/2010   PCP:  Lujean Amel, MD Pharmacy:   [facility] [place] [facility] [place] AT [facility] OF [address] [place] [address] Phone: [phone] Fax: [phone]

## 2021-03-28 NOTE — ED Triage Notes (Signed)
  Patient BIB EMS with L knee pain.  Patient states he was seen for a fall on 10/5 and they found no fractures.  Patient tried to get out of bed today and could not.  No pain at this time.

## 2021-03-28 NOTE — ED Notes (Signed)
Condom placed prior to discharge.

## 2021-03-28 NOTE — ED Notes (Signed)
Clayton [name] with TOC will assist pt's daughter with information needed

## 2021-03-28 NOTE — Progress Notes (Deleted)
[facility]   [phone] [phone]   Clinic Follow up Note   Patient Care Team: Lujean Amel, MD as PCP - General (Family Medicine) Leonie Man, MD as PCP - Cardiology (Cardiology) Elayne Snare, MD as Attending Physician (Endocrinology) 03/28/2021  CHIEF COMPLAINT:   CURRENT THERAPY:   INTERVAL HISTORY:   REVIEW OF SYSTEMS:   Constitutional: Denies fevers, chills or abnormal weight loss Eyes: Denies blurriness of vision Ears, nose, mouth, throat, and face: Denies mucositis or sore throat Respiratory: Denies cough, dyspnea or wheezes Cardiovascular: Denies palpitation, chest discomfort or lower extremity swelling Gastrointestinal:  Denies nausea, heartburn or change in bowel habits Skin: Denies abnormal skin rashes Lymphatics: Denies new lymphadenopathy or easy bruising Neurological:Denies numbness, tingling or new weaknesses Behavioral/Psych: Mood is stable, no new changes  All other systems were reviewed with the patient and are negative.  MEDICAL HISTORY:  Past Medical History:  Diagnosis Date   Anemia    BPH (benign prostatic hyperplasia)    CAD (coronary artery disease)    hx of stemi  04-2018    Chronic renal failure    Chronic renal insufficiency    COPD (chronic obstructive pulmonary disease) (HCC)    DDD (degenerative disc disease), lumbar    Diabetes mellitus    Hyperlipidemia    Hyperparathyroidism ([place])    Hypertension    IDA (iron deficiency anemia)    Neuromuscular disorder (HCC)    OSA on CPAP    Osteomyelitis of forearm (HCC)    Osteoporosis    Wenckebach second degree AV block 11/2020   Event monitor showed Darden Amber block intermittently as well as first-degree block.  Rare occasional PACs and PVCs.  Minimum heart rate 32 bpm at early morning hours.  Maximal heart rate 112 bpm, average 76 bpm.    SURGICAL HISTORY: Past Surgical History:  Procedure Laterality Date   CORONARY STENT INTERVENTION N/A  04/20/2018   Procedure: CORONARY STENT INTERVENTION;  Surgeon: Leonie Man, MD;  [facility]: [facility] INVASIVE CV LAB;;;     CORONARY/GRAFT ACUTE MI REVASCULARIZATION N/A 04/20/2018   Procedure: Coronary/Graft Acute MI Revascularization;  Surgeon: Leonie Man, MD;  Location: [facility] CV LAB;  Service: Cardiovascular;  Laterality: N/A; --> after initial PTCA restoring flow down the RCA, there was diffuse proximal to mid disease treated with a DES SYNERGY 3 X 38 ( 3.39mm)   EYE SURGERY     HOLTER MONITOR  05/2018   Sinus rhythm noted with sinus bradycardia.  Also sinus rhythm with 2-1 AV block noted.  Maximum heart rate was sinus tachycardia 123 bpm.  Rare PVCs accelerated idioventricular rhythm noted.  Intermittent Wenckebach block noted.   I & D EXTREMITY Right 08/13/2016   Procedure: IRRIGATION AND DEBRIDEMENT RIGHT ELBOW AND HAND;  Surgeon: Milly Jakob, MD;  Location: [facility] ORS;  Service: Orthopedics;  Laterality: Right;   LEFT HEART CATH AND CORONARY ANGIOGRAPHY N/A 04/20/2018   Procedure: LEFT HEART CATH AND CORONARY ANGIOGRAPHY;  Surgeon: Leonie Man, MD;  Location: [place] CV LAB; mRCA 100%&80% (after 55% tapering in pRCA), mLM-ostLAD 40% w/ 85% ostCx, ost-pCx 65% @ Om1 w/ ost 85%. pLAD 60% & 50% after D1 with distal 75%, ostD1 65% & 90% after small side branch.     TRANSTHORACIC ECHOCARDIOGRAM  04/21/2018   EF 50-55%. Posterior Wall HK. Severe RV HK, dilated IVC. Temp wire in place   VASECTOMY     VIDEO BRONCHOSCOPY Bilateral 08/25/2012   Procedure: VIDEO BRONCHOSCOPY  WITHOUT FLUORO;  Surgeon: Brand Males, MD;  Location: [facility];  Service: Cardiopulmonary;  Laterality: Bilateral;    I have reviewed the social history and family history with the patient and they are unchanged from previous note.  ALLERGIES:  is allergic to doxycycline, levaquin [levofloxacin in d5w], aliskiren, lisinopril, losartan potassium, nitrous oxide, penicillins, tape, and diltiazem.  MEDICATIONS:   Current Outpatient Medications  Medication Sig Dispense Refill   ACCU-CHEK SOFTCLIX LANCETS lancets TEST BLOOD SUGAR FOUR TIMES DAILY 400 each 1   albuterol (PROVENTIL HFA;VENTOLIN HFA) 108 (90 Base) MCG/ACT inhaler Inhale 1-2 puffs into the lungs every 6 (six) hours as needed for wheezing or shortness of breath.     Alcohol Swabs (B-D SINGLE USE SWABS REGULAR) PADS USE 7 SWABS DAILY AS DIRECTED 600 each 0   aspirin EC 81 MG tablet Take 1 tablet (81 mg total) by mouth daily. Swallow whole. 90 tablet 3   atorvastatin (LIPITOR) 40 MG tablet Take 1 tablet (40 mg total) by mouth daily. (Patient taking differently: Take 40 mg by mouth. Mon, Wed, Fri and Sat) 90 tablet 3   cholecalciferol (VITAMIN D) 1000 units tablet Take 2,000 Units by mouth daily.     Continuous Blood Gluc Receiver (FREESTYLE LIBRE 14 DAY READER) DEVI 1 each by Does not apply route every 14 (fourteen) days. Use reader to monitor blood sugar continuously with freestyle libre sensor. 1 each 0   Continuous Blood Gluc Sensor (FREESTYLE LIBRE 14 DAY SENSOR) MISC USE AS DIRECTED EVERY 14 DAYS TO MONITOR BLOOD SUGAR CONTINUOUSLY 6 each 3   finasteride (PROSCAR) 5 MG tablet TAKE 1 TABLET EVERY DAY (Patient not taking: Reported on 03/28/2021) 90 tablet 1   furosemide (LASIX) 20 MG tablet Take 1 tablet (20 mg total) by mouth 2 (two) times daily. Take seocnd dose about 12 noon each daily 180 tablet 3   glucose blood test strip Use as instructed to test blood sugars 4 times daily 150 each 12   insulin aspart (NOVOLOG) 100 UNIT/ML injection INJECT 7 UNITS SUBCUTANEOUS EVERY MORNING, 6 UNITS AT LUNCH, AND 9 UNITS AT SUPPER 30 mL 3   insulin glargine (LANTUS) 100 UNIT/ML injection Inject 0.16 mLs (16 Units total) into the skin daily. 32 mL 2   isosorbide mononitrate (IMDUR) 30 MG 24 hr tablet Take 1 tablet (30 mg total) by mouth daily. 30 tablet 11   Melatonin 5 MG TABS Take 2 tablets by mouth at bedtime.      Multiple Vitamin (MULTIVITAMIN WITH  MINERALS) TABS tablet Take 1 tablet by mouth daily.     nitroGLYCERIN (NITROSTAT) 0.4 MG SL tablet Place 1 tablet (0.4 mg total) under the tongue every 5 (five) minutes as needed for chest pain. 25 tablet 6   omeprazole (PRILOSEC) 20 MG capsule Take 1 capsule (20 mg total) by mouth daily. (Patient not taking: Reported on 03/28/2021) 30 capsule 3   oxybutynin (DITROPAN) 5 MG tablet Take 5 mg by mouth daily.     TRUEPLUS INSULIN SYRINGE 31G X 5/16" 0.3 ML MISC USE THREE TIMES DAILY AS NEEDED 300 each 2   No current facility-administered medications for this visit.    PHYSICAL EXAMINATION: ECOG PERFORMANCE STATUS: {CHL ONC ECOG [phone]}  There were no vitals filed for this visit. There were no vitals filed for this visit.  GENERAL:alert, no distress and comfortable SKIN: skin color, texture, turgor are normal, no rashes or significant lesions EYES: normal, Conjunctiva are pink and non-injected, sclera clear OROPHARYNX:no exudate, no erythema  and lips, buccal mucosa, and tongue normal  NECK: supple, thyroid normal size, non-tender, without nodularity LYMPH:  no palpable lymphadenopathy in the cervical, axillary or inguinal LUNGS: clear to auscultation and percussion with normal breathing effort HEART: regular rate & rhythm and no murmurs and no lower extremity edema ABDOMEN:abdomen soft, non-tender and normal bowel sounds Musculoskeletal:no cyanosis of digits and no clubbing  NEURO: alert & oriented x 3 with fluent speech, no focal motor/sensory deficits  LABORATORY DATA:  I have reviewed the data as listed CBC Latest Ref Rng & Units 03/28/2021 03/22/2021 03/21/2021  WBC 4.0 - 10.5 K/uL 12.6(H) 12.2(H) 10.1  Hemoglobin 13.0 - 17.0 g/dL 9.1(L) 7.1(L) 7.1(L)  Hematocrit 39.0 - 52.0 % 30.2(L) 24.4(L) 23.8(L)  Platelets 150 - 400 K/uL 396 218 216     CMP Latest Ref Rng & Units 03/28/2021 03/22/2021 03/22/2021  Glucose 70 - 99 mg/dL 149(H) 179(H) 207(H)  BUN 8 - 23 mg/dL 83(H) 94(H)  90(H)  Creatinine 0.61 - 1.24 mg/dL 2.44(H) 2.57(H) 2.68(H)  Sodium 135 - 145 mmol/L 145 142 150(H)  Potassium 3.5 - 5.1 mmol/L 3.9 4.0 4.4  Chloride 98 - 111 mmol/L 109 109 112(H)  CO2 22 - 32 mmol/L 23 27 28   Calcium 8.9 - 10.3 mg/dL 9.3 9.3 9.4  Total Protein 6.5 - 8.1 g/dL - - 6.7  Total Bilirubin 0.3 - 1.2 mg/dL - - 0.4  Alkaline Phos 38 - 126 U/L - - 97  AST 15 - 41 U/L - - 27  ALT 0 - 44 U/L - - 21      RADIOGRAPHIC STUDIES: I have personally reviewed the radiological images as listed and agreed with the findings in the report. CT Lumbar Spine Wo Contrast  Result Date: 03/28/2021 CLINICAL DATA:  Low back pain.  Fall. EXAM: CT LUMBAR SPINE WITHOUT CONTRAST TECHNIQUE: Multidetector CT imaging of the lumbar spine was performed without intravenous contrast administration. Multiplanar CT image reconstructions were also generated. COMPARISON:  None. FINDINGS: Segmentation: 5 lumbar type vertebrae Alignment: No traumatic malalignment.  Mild levoscoliosis. Vertebrae: No evidence of fracture or bone lesion. Paraspinal and other soft tissues: Diffuse colonic stool. Moderate distension of the bladder with right-sided diverticulum. Mild fat stranding around the left iliopsoas with possible minimal internal muscular hemorrhage. Disc levels: Disc desiccation with gas containing fissures and bulging especially at L2-3 to L4-5. Multilevel facet osteoarthritis greatest at L4-5 where there is anterolisthesis. Moderate to advanced spinal stenosis at L2-3 to L4-5, greatest at L4-5. Moderate foraminal narrowing on the right at L3-4 and L4-5. IMPRESSION: 1. Strain of the left iliopsoas. 2. No acute fracture. 3. Lumbar spine degeneration with spinal stenosis at L2-3 to L4-5. Electronically Signed   By: Jorje Guild M.D.   On: 03/28/2021 09:54   CT PELVIS WO CONTRAST  Result Date: 03/28/2021 CLINICAL DATA:  Hip trauma, fracture suspected, neg xray; Pelvic trauma EXAM: CT OF THE LEFT HIP WITHOUT CONTRAST  CT OF THE PELVIS WITHOUT CONTRAST TECHNIQUE: Multidetector CT imaging of the pelvis and left hip was performed according to the standard protocol. Multiplanar CT image reconstructions were also generated. COMPARISON:  X-ray 03/28/2021, 03/22/2021.  CT 08/13/2012 FINDINGS: Bones/Joint/Cartilage No acute fracture or diastasis of the pelvis. Mild degenerative changes of the bilateral sacroiliac joints. Pubic symphysis within normal limits. Bilateral hip joints are intact without fracture or dislocation. Mild joint space narrowing with small marginal osteophyte formation of both hips. There may be a trace left hip joint effusion. No evidence of a right hip joint  effusion. No evidence of femoral head avascular necrosis. Small benign enchondroma within the intertrochanteric aspect of the left femur, stable since 2014. No suspicious lytic or sclerotic bone lesion. Bilateral greater trochanteric enthesopathic changes. Ligaments Suboptimally assessed by CT. Muscles and Tendons Thickened appearance of the left iliopsoas tendon with ill-defined fluid adjacent to the distal tendon as well as along the anterior aspect near the myotendinous junction (series 6, image 13; series 6, image 58-60). No obvious full-thickness retracted tear of the left iliopsoas tendon. Remaining musculotendinous structures appear within normal limits by CT. Soft tissues No organized fluid collection or hematoma. Mildly enlarged prostate gland. Moderately distended urinary bladder with a small right posterolateral diverticulum. Atherosclerotic calcifications throughout the aortoiliac axis and bilateral outflow. No inguinal lymphadenopathy. Large volume of stool within the visualized colon. No acute findings are seen within the pelvis. IMPRESSION: 1. No acute osseous abnormality of the pelvis or bilateral hips. 2. Thickened appearance of the left iliopsoas tendon with ill-defined fluid adjacent to the distal tendon and myotendinous junction. No obvious  full-thickness retracted tear of the left iliopsoas tendon by CT. Findings may reflect left iliopsoas tendinosis with possible partial thickness tearing. Adjacent fluid could be reactive/traumatic or reflect a component of bursitis. 3. Mildly enlarged prostate gland. 4. Moderately distended urinary bladder with a small right posterolateral diverticulum. Correlate for outlet obstruction. Aortic Atherosclerosis (ICD10-I70.0). Electronically Signed   By: Davina Poke D.O.   On: 03/28/2021 09:44   CT Hip Left Wo Contrast  Result Date: 03/28/2021 CLINICAL DATA:  Hip trauma, fracture suspected, neg xray; Pelvic trauma EXAM: CT OF THE LEFT HIP WITHOUT CONTRAST CT OF THE PELVIS WITHOUT CONTRAST TECHNIQUE: Multidetector CT imaging of the pelvis and left hip was performed according to the standard protocol. Multiplanar CT image reconstructions were also generated. COMPARISON:  X-ray 03/28/2021, 03/22/2021.  CT 08/13/2012 FINDINGS: Bones/Joint/Cartilage No acute fracture or diastasis of the pelvis. Mild degenerative changes of the bilateral sacroiliac joints. Pubic symphysis within normal limits. Bilateral hip joints are intact without fracture or dislocation. Mild joint space narrowing with small marginal osteophyte formation of both hips. There may be a trace left hip joint effusion. No evidence of a right hip joint effusion. No evidence of femoral head avascular necrosis. Small benign enchondroma within the intertrochanteric aspect of the left femur, stable since 2014. No suspicious lytic or sclerotic bone lesion. Bilateral greater trochanteric enthesopathic changes. Ligaments Suboptimally assessed by CT. Muscles and Tendons Thickened appearance of the left iliopsoas tendon with ill-defined fluid adjacent to the distal tendon as well as along the anterior aspect near the myotendinous junction (series 6, image 13; series 6, image 58-60). No obvious full-thickness retracted tear of the left iliopsoas tendon.  Remaining musculotendinous structures appear within normal limits by CT. Soft tissues No organized fluid collection or hematoma. Mildly enlarged prostate gland. Moderately distended urinary bladder with a small right posterolateral diverticulum. Atherosclerotic calcifications throughout the aortoiliac axis and bilateral outflow. No inguinal lymphadenopathy. Large volume of stool within the visualized colon. No acute findings are seen within the pelvis. IMPRESSION: 1. No acute osseous abnormality of the pelvis or bilateral hips. 2. Thickened appearance of the left iliopsoas tendon with ill-defined fluid adjacent to the distal tendon and myotendinous junction. No obvious full-thickness retracted tear of the left iliopsoas tendon by CT. Findings may reflect left iliopsoas tendinosis with possible partial thickness tearing. Adjacent fluid could be reactive/traumatic or reflect a component of bursitis. 3. Mildly enlarged prostate gland. 4. Moderately distended urinary bladder with a small  right posterolateral diverticulum. Correlate for outlet obstruction. Aortic Atherosclerosis (ICD10-I70.0). Electronically Signed   By: Davina Poke D.O.   On: 03/28/2021 09:44   DG Knee Complete 4 Views Left  Result Date: 03/28/2021 CLINICAL DATA:  Left knee pain.  Fall 03/22/2020. EXAM: LEFT KNEE - COMPLETE 4 VIEW COMPARISON:  None. FINDINGS: No evidence of fracture, dislocation, or joint effusion. Generalized osteopenia and atherosclerosis. Generalized subcutaneous reticulation. IMPRESSION: Negative for fracture or malalignment. Electronically Signed   By: Jorje Guild M.D.   On: 03/28/2021 08:15   DG Hip Unilat W or Wo Pelvis 2-3 Views Left  Result Date: 03/28/2021 CLINICAL DATA:  Fall with left knee pain. EXAM: DG HIP (WITH OR WITHOUT PELVIS) 3V LEFT COMPARISON:  None. FINDINGS: There is no evidence of hip fracture or dislocation. Generalized osteopenia. Benign-appearing small sclerotic area in the left  intratrochanteric femur. Atheromatous calcification. IMPRESSION: No acute finding. Electronically Signed   By: Jorje Guild M.D.   On: 03/28/2021 08:13     ASSESSMENT & PLAN:  No problem-specific Assessment & Plan notes found for this encounter.   No orders of the defined types were placed in this encounter.  All questions were answered. The patient knows to call the clinic with any problems, questions or concerns. No barriers to learning was detected. I spent {CHL ONC TIME VISIT - QPRFF:[id number]} counseling the patient face to face. The total time spent in the appointment was {CHL ONC TIME VISIT - TTSVX:[id number]} and more than 50% was on counseling and review of test results     Alla Feeling, NP 03/28/21

## 2021-03-28 NOTE — Discharge Instructions (Addendum)
Wheelchair should be delivered to the home Hemoglobin today is 9.1 and creatinine is 2.4

## 2021-03-28 NOTE — ED Notes (Addendum)
EKG documented in error.

## 2021-03-28 NOTE — ED Notes (Signed)
Patient transported to CT 

## 2021-03-29 ENCOUNTER — Other Ambulatory Visit: Payer: HMO

## 2021-03-29 ENCOUNTER — Ambulatory Visit: Payer: HMO | Admitting: Nurse Practitioner

## 2021-03-31 ENCOUNTER — Encounter (HOSPITAL_COMMUNITY): Payer: Self-pay

## 2021-04-18 ENCOUNTER — Other Ambulatory Visit: Payer: Self-pay

## 2021-04-18 ENCOUNTER — Inpatient Hospital Stay: Payer: HMO | Attending: Hematology and Oncology

## 2021-04-18 ENCOUNTER — Inpatient Hospital Stay: Payer: HMO

## 2021-04-18 VITALS — BP 128/63 | HR 77 | Temp 98.0°F | Resp 16

## 2021-04-18 DIAGNOSIS — D460 Refractory anemia without ring sideroblasts, so stated: Secondary | ICD-10-CM | POA: Insufficient documentation

## 2021-04-18 DIAGNOSIS — I129 Hypertensive chronic kidney disease with stage 1 through stage 4 chronic kidney disease, or unspecified chronic kidney disease: Secondary | ICD-10-CM | POA: Diagnosis not present

## 2021-04-18 DIAGNOSIS — D631 Anemia in chronic kidney disease: Secondary | ICD-10-CM | POA: Diagnosis not present

## 2021-04-18 DIAGNOSIS — Z79899 Other long term (current) drug therapy: Secondary | ICD-10-CM | POA: Insufficient documentation

## 2021-04-18 DIAGNOSIS — L03115 Cellulitis of right lower limb: Secondary | ICD-10-CM

## 2021-04-18 DIAGNOSIS — D649 Anemia, unspecified: Secondary | ICD-10-CM

## 2021-04-18 DIAGNOSIS — N184 Chronic kidney disease, stage 4 (severe): Secondary | ICD-10-CM | POA: Diagnosis not present

## 2021-04-18 DIAGNOSIS — E611 Iron deficiency: Secondary | ICD-10-CM

## 2021-04-18 LAB — CBC WITH DIFFERENTIAL/PLATELET
Abs Immature Granulocytes: 0.05 10*3/uL (ref 0.00–0.07)
Basophils Absolute: 0.1 10*3/uL (ref 0.0–0.1)
Basophils Relative: 1 %
Eosinophils Absolute: 0.6 10*3/uL — ABNORMAL HIGH (ref 0.0–0.5)
Eosinophils Relative: 5 %
HCT: 28.6 % — ABNORMAL LOW (ref 39.0–52.0)
Hemoglobin: 8.7 g/dL — ABNORMAL LOW (ref 13.0–17.0)
Immature Granulocytes: 0 %
Lymphocytes Relative: 7 %
Lymphs Abs: 0.9 10*3/uL (ref 0.7–4.0)
MCH: 26 pg (ref 26.0–34.0)
MCHC: 30.4 g/dL (ref 30.0–36.0)
MCV: 85.6 fL (ref 80.0–100.0)
Monocytes Absolute: 1.1 10*3/uL — ABNORMAL HIGH (ref 0.1–1.0)
Monocytes Relative: 9 %
Neutro Abs: 9.8 10*3/uL — ABNORMAL HIGH (ref 1.7–7.7)
Neutrophils Relative %: 78 %
Platelets: 369 10*3/uL (ref 150–400)
RBC: 3.34 MIL/uL — ABNORMAL LOW (ref 4.22–5.81)
RDW: 20.1 % — ABNORMAL HIGH (ref 11.5–15.5)
WBC: 12.5 10*3/uL — ABNORMAL HIGH (ref 4.0–10.5)
nRBC: 0 % (ref 0.0–0.2)

## 2021-04-18 LAB — CMP (CANCER CENTER ONLY)
ALT: 14 U/L (ref 0–44)
AST: 18 U/L (ref 15–41)
Albumin: 2.9 g/dL — ABNORMAL LOW (ref 3.5–5.0)
Alkaline Phosphatase: 110 U/L (ref 38–126)
Anion gap: 9 (ref 5–15)
BUN: 62 mg/dL — ABNORMAL HIGH (ref 8–23)
CO2: 23 mmol/L (ref 22–32)
Calcium: 8.5 mg/dL — ABNORMAL LOW (ref 8.9–10.3)
Chloride: 112 mmol/L — ABNORMAL HIGH (ref 98–111)
Creatinine: 2.58 mg/dL — ABNORMAL HIGH (ref 0.61–1.24)
GFR, Estimated: 23 mL/min — ABNORMAL LOW (ref >60)
Glucose, Bld: 166 mg/dL — ABNORMAL HIGH (ref 70–99)
Potassium: 4.5 mmol/L (ref 3.5–5.1)
Sodium: 144 mmol/L (ref 135–145)
Total Bilirubin: 0.2 mg/dL — ABNORMAL LOW (ref 0.3–1.2)
Total Protein: 6.7 g/dL (ref 6.5–8.1)

## 2021-04-18 LAB — TYPE AND SCREEN
ABO/RH(D): B POS
Antibody Screen: NEGATIVE

## 2021-04-18 MED ORDER — DARBEPOETIN ALFA 40 MCG/0.4ML IJ SOSY
40.0000 ug | PREFILLED_SYRINGE | Freq: Once | INTRAMUSCULAR | Status: AC
  Administered 2021-04-18: 40 ug via SUBCUTANEOUS
  Filled 2021-04-18: qty 0.4

## 2021-04-18 NOTE — Patient Instructions (Signed)
Darbepoetin Alfa injection What is this medication? DARBEPOETIN ALFA (dar be POE e tin AL fa) helps your body make more red blood cells. It is used to treat anemia caused by chronic kidney failure and chemotherapy. This medicine may be used for other purposes; ask your health care provider or pharmacist if you have questions. COMMON BRAND NAME(S): Aranesp What should I tell my care team before I take this medication? They need to know if you have any of these conditions: blood clotting disorders or history of blood clots cancer patient not on chemotherapy cystic fibrosis heart disease, such as angina, heart failure, or a history of a heart attack hemoglobin level of 12 g/dL or greater high blood pressure low levels of folate, iron, or vitamin B12 seizures an unusual or allergic reaction to darbepoetin, erythropoietin, albumin, hamster proteins, latex, other medicines, foods, dyes, or preservatives pregnant or trying to get pregnant breast-feeding How should I use this medication? This medicine is for injection into a vein or under the skin. It is usually given by a health care professional in a hospital or clinic setting. If you get this medicine at home, you will be taught how to prepare and give this medicine. Use exactly as directed. Take your medicine at regular intervals. Do not take your medicine more often than directed. It is important that you put your used needles and syringes in a special sharps container. Do not put them in a trash can. If you do not have a sharps container, call your pharmacist or healthcare provider to get one. A special MedGuide will be given to you by the pharmacist with each prescription and refill. Be sure to read this information carefully each time. Talk to your pediatrician regarding the use of this medicine in children. While this medicine may be used in children as young as 1 month of age for selected conditions, precautions do apply. Overdosage: If you  think you have taken too much of this medicine contact a poison control center or emergency room at once. NOTE: This medicine is only for you. Do not share this medicine with others. What if I miss a dose? If you miss a dose, take it as soon as you can. If it is almost time for your next dose, take only that dose. Do not take double or extra doses. What may interact with this medication? Do not take this medicine with any of the following medications: epoetin alfa This list may not describe all possible interactions. Give your health care provider a list of all the medicines, herbs, non-prescription drugs, or dietary supplements you use. Also tell them if you smoke, drink alcohol, or use illegal drugs. Some items may interact with your medicine. What should I watch for while using this medication? Your condition will be monitored carefully while you are receiving this medicine. You may need blood work done while you are taking this medicine. This medicine may cause a decrease in vitamin B6. You should make sure that you get enough vitamin B6 while you are taking this medicine. Discuss the foods you eat and the vitamins you take with your health care professional. What side effects may I notice from receiving this medication? Side effects that you should report to your doctor or health care professional as soon as possible: allergic reactions like skin rash, itching or hives, swelling of the face, lips, or tongue breathing problems changes in vision chest pain confusion, trouble speaking or understanding feeling faint or lightheaded, falls high blood pressure   muscle aches or pains pain, swelling, warmth in the leg rapid weight gain severe headaches sudden numbness or weakness of the face, arm or leg trouble walking, dizziness, loss of balance or coordination seizures (convulsions) swelling of the ankles, feet, hands unusually weak or tired Side effects that usually do not require medical  attention (report to your doctor or health care professional if they continue or are bothersome): diarrhea fever, chills (flu-like symptoms) headaches nausea, vomiting redness, stinging, or swelling at site where injected This list may not describe all possible side effects. Call your doctor for medical advice about side effects. You may report side effects to FDA at 1-800-FDA-1088. Where should I keep my medication? Keep out of the reach of children. Store in a refrigerator between 2 and 8 degrees C (36 and 46 degrees F). Do not freeze. Do not shake. Throw away any unused portion if using a single-dose vial. Throw away any unused medicine after the expiration date. NOTE: This sheet is a summary. It may not cover all possible information. If you have questions about this medicine, talk to your doctor, pharmacist, or health care provider.  2022 [employer] (2017-06-19 16:44:20)  

## 2021-04-21 ENCOUNTER — Encounter: Payer: Self-pay | Admitting: Cardiology

## 2021-04-21 ENCOUNTER — Ambulatory Visit (INDEPENDENT_AMBULATORY_CARE_PROVIDER_SITE_OTHER): Payer: HMO | Admitting: Cardiology

## 2021-04-21 ENCOUNTER — Other Ambulatory Visit: Payer: Self-pay

## 2021-04-21 VITALS — BP 153/69 | HR 70 | Ht 70.0 in | Wt 198.0 lb

## 2021-04-21 DIAGNOSIS — I4430 Unspecified atrioventricular block: Secondary | ICD-10-CM

## 2021-04-21 DIAGNOSIS — E1169 Type 2 diabetes mellitus with other specified complication: Secondary | ICD-10-CM

## 2021-04-21 DIAGNOSIS — I25119 Atherosclerotic heart disease of native coronary artery with unspecified angina pectoris: Secondary | ICD-10-CM

## 2021-04-21 DIAGNOSIS — I2119 ST elevation (STEMI) myocardial infarction involving other coronary artery of inferior wall: Secondary | ICD-10-CM | POA: Diagnosis not present

## 2021-04-21 DIAGNOSIS — I5032 Chronic diastolic (congestive) heart failure: Secondary | ICD-10-CM

## 2021-04-21 DIAGNOSIS — E611 Iron deficiency: Secondary | ICD-10-CM

## 2021-04-21 DIAGNOSIS — Z955 Presence of coronary angioplasty implant and graft: Secondary | ICD-10-CM | POA: Diagnosis not present

## 2021-04-21 DIAGNOSIS — E785 Hyperlipidemia, unspecified: Secondary | ICD-10-CM

## 2021-04-21 DIAGNOSIS — I10 Essential (primary) hypertension: Secondary | ICD-10-CM

## 2021-04-21 DIAGNOSIS — N184 Chronic kidney disease, stage 4 (severe): Secondary | ICD-10-CM

## 2021-04-21 DIAGNOSIS — I4891 Unspecified atrial fibrillation: Secondary | ICD-10-CM

## 2021-04-21 DIAGNOSIS — I4819 Other persistent atrial fibrillation: Secondary | ICD-10-CM

## 2021-04-21 NOTE — Patient Instructions (Signed)
Medication Instructions:   No changes *If you need a refill on your cardiac medications before your next appointment, please call your pharmacy*   Lab Work:  Not needed   Testing/Procedures:  Not needed  Follow-Up: At [facility], you and your health needs are our priority.  As part of our continuing mission to provide you with exceptional heart care, we have created designated Provider Care Teams.  These Care Teams include your primary Cardiologist (physician) and Advanced Practice Providers (APPs -  Physician Assistants and Nurse Practitioners) who all work together to provide you with the care you need, when you need it.     Your next appointment:   6 month(s)  The format for your next appointment:   In Person  Provider:   Glenetta Hew, MD    Other Instructions    Will discuss with Mr Knoble 's Hematologist  about the use a anticoagulant and contact you and facility

## 2021-04-21 NOTE — Progress Notes (Signed)
Primary Care Provider: Lujean Amel, MD Cardiologist: Glenetta Hew, MD Electrophysiologist: None Heme-Onc: Benay Pike, MD Nephrologist: Pearson Grippe, MD  Clinic Note: Chief Complaint  Patient presents with   Follow-up    1-month-several hospital stays.   Coronary Artery Disease    No angina   Atrial Fibrillation    No symptoms    ===================================  ASSESSMENT/PLAN   Problem List Items Addressed This Visit       Cardiology Problems   ST-segment elevation myocardial infarction (STEMI) of inferior wall (HCC) (Chronic)   CAD, multiple vessel (Chronic)    Multivessel CAD with severe circumflex disease  Chronic medical therapy.  He does have some intermittent twinges of chest comfort, but no active anginal symptoms.  Evaluation at this point especially based on conversation that the daughter had with hospitalist. Plan: Continue Imdur and aspirin along with statin.      Persistent atrial fibrillation (HCC) (Chronic)    This is the first time of actually seen that he has been in atrial fibrillation.  Rate is pretty well controlled on no medications.  No requirement for rate or rhythm control agent.  Would avoid anticoagulation despite advanced risks due to his anemia of chronic disease and probably likely slow GI bleed.  This patients CHA2DS2-VASc Score and unadjusted Ischemic Stroke Rate (% per year) is equal to 11.2 % stroke rate/year from a score of 7  Above score calculated as 1 point each if present [CHF, HTN, DM, Vascular=MI/PAD/Aortic Plaque, Age if 65-74, or Male] Above score calculated as 2 points each if present [Age > 75, or Stroke/TIA/TE]      Hyperlipidemia associated with type 2 diabetes mellitus ([place]) (Chronic)    He is on atorvastatin.  Tolerating well.  Most recent labs showed LDL 42.  Well within goal.      Chronic heart failure with preserved ejection fraction (HFpEF) (HCC)    Grade 2 diastolic function with significant  elevated PA pressures.  Probably multifactorial.  This could probably explains of his edema  He is on STEMI dose of Lasix.  Continue to recommend support stockings.  He did not tolerate TED hose, but sports legs may be better.      Essential hypertension (Chronic)    Blood pressure is little high today, but would prefer permissive hypertension as opposed to aggressive antihypertensive management.Marland Kitchen  He is only on Imdur and furosemide but no other antihypertensives      AV heart block (Chronic)    Wenckebach block noted on his EKGs before.  Not really noticing any flip-flops or skipping beats or prolonged.  She has had some irregular heartbeats.  He is currently in A. fib but relatively asymptomatic.        Other   Presence of drug-eluting stent in right coronary artery (Chronic)    On aspirin.  No longer on Thienopyridine.  Antiplatelet agent due to longstanding anemia      Iron deficiency    Occasionally getting Feraheme infusions along with blood transfusions.  I am very leery of putting this elderly gentleman on a DOAC despite being in A. fib with an elevated CHA2DS2-VASc score.  Would like to get the opinion of his oncologist and nephrologist prior to considering invasive management.      Chronic kidney disease, stage IV (severe) (HCC) (Chronic)    Baseline creatinine around 3.  Being followed by nephrology.  Not really a dialysis candidate.  Do suspect that there is some component of his chronic anemia that is  related to his insufficiency.      Other Visit Diagnoses     New onset a-fib [facility])    -  Primary   Relevant Orders   EKG 12-Lead (Completed)     ===================================  HPI:    HERMEN MARIO is a 87 y.o. male with a PMH notable for MV CAD (Inferior STEMI November 2019-RCA PCI) w/ Chronic Iron Deficiency Anemia (-> Feraheme & Blood transfusions), h/o Bradycardia w/ Wenkebach Block, CKD 4 (~Cr 3), & COPD who presents today for 3 month f/u.  Nov 15, 2020 - having persistent bleeding issues. No Angina or CHF Sx.  Brilinta stopped.  Placed on aspirin 81 mg daily.  Changed atorvastatin to Monday, Wednesday, Friday and Saturday then increase to 40 mg daily. 7 d monitor ordered to recheck HR  SHERYL TOWELL was last seen on February 09, 2021 -> he was doing fairly well.  On follow-up.  Still noticing exertional dyspnea related to anemia and COPD but no chest pain or pressure.  Chronic motion of edema with increased dose of Lasix to 40 mg twice daily over a month or so.  Off and on additional dose.  No PND orthopnea.  Had an episode of left leg cellulitis.  Have put back on some weight, stronger.  Doing better with reduced dose interval of atorvastatin to 40 days a week.  Recent Hospitalizations:  9/23-25/2022: Admitted for symptomatic anemia. ->  Hemoglobin was 5.7 => given 2 units packed red blood cells.  Improved hemoglobin to 7.5 upon discharge.  Mild troponin elevation noted.  Likely related to renal insufficiency and anemia Roxy Manns.  Did not wish for cardiology consult. => Made DNR.Marland Kitchen  Discussed possibility of outpatient palliative care. 03/22/2021: [facility], ER for fall. => He sat down in his chair in the garage and fell backwards and hit the back of his head.  No LOC.  Was not able to give Impella for blood cell.  Scalp laceration noted dressed. => Still noted chronic edema and wounds. 03/28/2021: [facility] ER recent left hip pain with inability to walk => results of previous fall. => Was [place] to an [place].  Reviewed  CV studies:    The following studies were reviewed today: (if available, images/films reviewed: From Epic Chart or Care Everywhere) Event Monitor:  Predominant rhythm is sinus with a Min HR 32 bpm, Max 112 bpm, Avg HR 76 bpm.  1  AV block noted at baseline along with intermittent Wenkebach block (2  AVB -Mobitz 1 -> intermittently dropping beats after prolonging conduction, usually benign)  Occasional  PVCs noted with rare couplets and triplets. Occasional bigeminy.Very rare PACs noted.  No patient triggered events.  Slowest heart rates noted at 5:30 AM. Fastest heart rate at 7:26 PM Echo 03/11/2021: EF 45 to 50%.  Moderate dysfunction with no obvious RWM A.  Mild LVH with GR 2 DD-severe LA dilation..  Severely elevated PAP (64 mmHg) and elevated CVP/RAP.Marland Kitchen  Normal MV.  Mild to moderate aortic sclerosis-no stenosis  Interval History:   ODARIUS DINES returns here today for 60-month follow-up having had a relatively rough last 3 months.  Not so much from a cardiac standpoint.  Just that he has had several falls and issues with anemia.  He has not had any sensation of irregular heartbeats or palpitations.  No heart racing or irregular heartbeats.  He has occasional off-and-on tingling in his chest but no real chest pain or pressure.  Nothing exertional.  At  baseline he does walk around the assisted-living facility with a high walker, but does need assistance with standing up.  CV Review of Symptoms (Summary):  positive for - dyspnea on exertion, edema, shortness of breath, and some fatigue as well as decreased exercise tolerance.  Edema well controlled.. negative for - chest pain, irregular heartbeat, orthopnea, palpitations, paroxysmal nocturnal dyspnea, rapid heart rate, or lightheadedness or dizziness, syncope/near syncope or TIA/amaurosis fugax claudication  REVIEWED OF SYSTEMS   Review of Systems  Constitutional:  Positive for malaise/fatigue (Some exercise intolerance and fatigue related to anemia.). Negative for chills, fever and weight loss (Gained back some weight).  HENT:  Negative for congestion and nosebleeds.   Respiratory:  Positive for cough (Chronic, nonproductive) and shortness of breath (Baseline).   Cardiovascular:  Positive for leg swelling (Pretty stable.).       Per HPI  Gastrointestinal:  Negative for blood in stool and melena.  Genitourinary:  Negative for dysuria and  hematuria.  Musculoskeletal:  Positive for joint pain. Negative for myalgias.  Neurological:  Positive for weakness (Generalized weakness.  Unable to get himself up from sitting or from lying down.). Negative for dizziness, focal weakness and headaches.  Psychiatric/Behavioral:  Positive for memory loss. Negative for depression. The patient is not nervous/anxious and does not have insomnia.    I have reviewed and (if needed) personally updated the patient's problem list, medications, allergies, past medical and surgical history, social and family history.   PAST MEDICAL HISTORY   Past Medical History:  Diagnosis Date   Anemia    BPH (benign prostatic hyperplasia)    CAD (coronary artery disease)    hx of stemi  04-2018    Chronic renal failure    Chronic renal insufficiency    COPD (chronic obstructive pulmonary disease) (HCC)    DDD (degenerative disc disease), lumbar    Diabetes mellitus    Hyperlipidemia    Hyperparathyroidism ([place])    Hypertension    IDA (iron deficiency anemia)    Neuromuscular disorder (HCC)    OSA on CPAP    Osteomyelitis of forearm (HCC)    Osteoporosis    Wenckebach second degree AV block 11/2020   Event monitor showed Darden Amber block intermittently as well as first-degree block.  Rare occasional PACs and PVCs.  Minimum heart rate 32 bpm at early morning hours.  Maximal heart rate 112 bpm, average 76 bpm.    PAST SURGICAL HISTORY   Past Surgical History:  Procedure Laterality Date   CORONARY STENT INTERVENTION N/A 04/20/2018   Procedure: CORONARY STENT INTERVENTION;  Surgeon: Leonie Man, MD;  Location: [place] [facility];;;     CORONARY/GRAFT ACUTE MI REVASCULARIZATION N/A 04/20/2018   Procedure: Coronary/Graft Acute MI Revascularization;  Surgeon: Leonie Man, MD;  Location: [place] [facility];  Service: Cardiovascular;  Laterality: N/A; --> after initial PTCA restoring flow down the RCA, there was diffuse proximal to mid disease treated  with a DES SYNERGY 3 X 38 ( 3.42mm)   EYE SURGERY     HOLTER MONITOR  05/2018   Sinus rhythm noted with sinus bradycardia.  Also sinus rhythm with 2-1 AV block noted.  Maximum heart rate was sinus tachycardia 123 bpm.  Rare PVCs accelerated idioventricular rhythm noted.  Intermittent Wenckebach block noted.   I & D EXTREMITY Right 08/13/2016   Procedure: IRRIGATION AND DEBRIDEMENT RIGHT ELBOW AND HAND;  Surgeon: Milly Jakob, MD;  Location: [facility];  Service: Orthopedics;  Laterality: Right;  LEFT HEART CATH AND CORONARY ANGIOGRAPHY N/A 04/20/2018   Procedure: LEFT HEART CATH AND CORONARY ANGIOGRAPHY;  Surgeon: Leonie Man, MD;  Location: [place] [facility]; mRCA 100%&80% (after 55% tapering in pRCA), mLM-ostLAD 40% w/ 85% ostCx, ost-pCx 65% @ Om1 w/ ost 85%. pLAD 60% & 50% after D1 with distal 75%, ostD1 65% & 90% after small side branch.     TRANSTHORACIC ECHOCARDIOGRAM  04/21/2018   EF 50-55%. Posterior Wall HK. Severe RV HK, dilated IVC. Temp wire in place   VASECTOMY     VIDEO BRONCHOSCOPY Bilateral 08/25/2012   Procedure: VIDEO BRONCHOSCOPY WITHOUT FLUORO;  Surgeon: Brand Males, MD;  Location: [place];  Service: Cardiopulmonary;  Laterality: Bilateral;      Immunization History  Administered Date(s) Administered   Influenza Split 03/18/2012   Influenza,inj,Quad PF,6+ Mos 03/05/2013   Influenza-Unspecified 02/15/2015, 04/01/2020   PFIZER(Purple Top)SARS-COV-2 Vaccination 07/01/2019, 07/22/2019   Pneumococcal Conjugate-13 09/30/2013   Pneumococcal Polysaccharide-23 06/20/2012   Tdap 09/25/2015, 03/22/2021   Zoster Recombinat (Shingrix) 04/10/2017, 10/09/2017    MEDICATIONS/ALLERGIES   Current Meds  Medication Sig   ACCU-CHEK SOFTCLIX LANCETS lancets TEST BLOOD SUGAR FOUR TIMES DAILY   albuterol (PROVENTIL HFA;VENTOLIN HFA) 108 (90 Base) MCG/ACT inhaler Inhale 1-2 puffs into the lungs every 6 (six) hours as needed for wheezing or shortness of breath.   Alcohol Swabs  (B-D SINGLE USE SWABS REGULAR) PADS USE 7 SWABS DAILY AS DIRECTED   aspirin EC 81 MG tablet Take 1 tablet (81 mg total) by mouth daily. Swallow whole.   atorvastatin (LIPITOR) 40 MG tablet Take 1 tablet (40 mg total) by mouth daily. (Patient taking differently: Take 40 mg by mouth. Mon, Wed, Fri and Sat)   cholecalciferol (VITAMIN D) 1000 units tablet Take 2,000 Units by mouth daily.   Continuous Blood Gluc Receiver (FREESTYLE LIBRE 14 DAY READER) DEVI 1 each by Does not apply route every 14 (fourteen) days. Use reader to monitor blood sugar continuously with freestyle libre sensor.   Continuous Blood Gluc Sensor (FREESTYLE LIBRE 14 DAY SENSOR) MISC USE AS DIRECTED EVERY 14 DAYS TO MONITOR BLOOD SUGAR CONTINUOUSLY   finasteride (PROSCAR) 5 MG tablet TAKE 1 TABLET EVERY DAY   furosemide (LASIX) 20 MG tablet Take 1 tablet (20 mg total) by mouth 2 (two) times daily. Take seocnd dose about 12 noon each daily   glucose blood test strip Use as instructed to test blood sugars 4 times daily   isosorbide mononitrate (IMDUR) 30 MG 24 hr tablet Take 1 tablet (30 mg total) by mouth daily.   Melatonin 5 MG TABS Take 2 tablets by mouth at bedtime.    Multiple Vitamin (MULTIVITAMIN WITH MINERALS) TABS tablet Take 1 tablet by mouth daily.   nitroGLYCERIN (NITROSTAT) 0.4 MG SL tablet Place 1 tablet (0.4 mg total) under the tongue every 5 (five) minutes as needed for chest pain.   omeprazole (PRILOSEC) 20 MG capsule Take 1 capsule (20 mg total) by mouth daily.   oxybutynin (DITROPAN) 5 MG tablet Take 5 mg by mouth daily.   TRUEPLUS INSULIN SYRINGE 31G X 5/16" 0.3 ML MISC USE THREE TIMES DAILY AS NEEDED   [DISCONTINUED] insulin aspart (NOVOLOG) 100 UNIT/ML injection INJECT 7 UNITS SUBCUTANEOUS EVERY MORNING, 6 UNITS AT LUNCH, AND 9 UNITS AT SUPPER   [DISCONTINUED] insulin glargine (LANTUS) 100 UNIT/ML injection Inject 0.16 mLs (16 Units total) into the skin daily.    Allergies  Allergen Reactions   Doxycycline  Rash   Levaquin [Levofloxacin In  D5w]     Caused chest pain and heartburn   Aliskiren Other (See Comments)    Unknown per Daughter    Lisinopril Other (See Comments)    Unknown per Daughter    Losartan Potassium Other (See Comments)   Nitrous Oxide Other (See Comments)    Reaction:  Unknown    Penicillins Other (See Comments)    Reaction:  Unknown Has patient had a PCN reaction causing immediate rash, facial/tongue/throat swelling, SOB or lightheadedness with hypotension: Unsure Has patient had a PCN reaction causing severe rash involving mucus membranes or skin necrosis: Unsure Has patient had a PCN reaction that required hospitalization Unsure  Has patient had a PCN reaction occurring within the last 10 years: No If all of the above answers are "NO", then may proceed with Cephalosporin use.   Tape Other (See Comments)    Reaction:  Tears pts skin    Diltiazem Rash    SOCIAL HISTORY/FAMILY HISTORY   Reviewed in Epic:  Pertinent findings:  Social History   Tobacco Use   Smoking status: Former    Packs/day: 3.00    Years: 33.00    Pack years: 99.00    Types: Cigarettes    Quit date: 06/19/1973    Years since quitting: 47.9   Smokeless tobacco: Former    Quit date: 09/26/1975  Vaping Use   Vaping Use: Never used  Substance Use Topics   Alcohol use: No   Drug use: No   Social History   Social History Narrative   He is accompanied by his daughter who seems to be quite involved with his care.      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 He also went to the [facility] several times a week until he began to have symptoms.    -> Following his second ER visit in October, he was moved to an assisted living facility.  Still needs assistance with standing up or sitting down by himself.  Able to walk with high walker.  OBJCTIVE -PE, EKG, labs   Wt Readings from Last 3 Encounters:  04/21/21 198 lb (89.8 kg)  03/28/21 196 lb (88.9 kg)   03/15/21 196 lb 9.6 oz (89.2 kg)    Physical Exam: BP (!) 153/69 (BP Location: Left Arm, Patient Position: Sitting, Cuff Size: Normal)   Pulse 70   Ht 5\' 10"  (1.778 m)   Wt 198 lb (89.8 kg)   SpO2 99%   BMI 28.41 kg/m  Physical Exam Vitals reviewed.  Constitutional:      General: He is not in acute distress.    Appearance: He is not ill-appearing or toxic-appearing.     Comments: Appears stronger than he was earlier this year.  Still has a somewhat disheveled/scruffy appearance.  Well-groomed, but is not well shaved.  HENT:     Head: Normocephalic and atraumatic.     Ears:     Comments: Very hard of hearing Neck:     Vascular: No carotid bruit, hepatojugular reflux or JVD.  Cardiovascular:     Rate and Rhythm: Normal rate and regular rhythm. Occasional Extrasystoles are present.    Chest Wall: PMI is not displaced.     Pulses: Decreased pulses (Decreased with palpable pedal pulses).     Heart sounds: S1 normal and S2 normal. Heart sounds are distant. No murmur heard.   No friction rub. No gallop.  Pulmonary:     Effort: Pulmonary effort is normal.  No respiratory distress.     Breath sounds: Normal breath sounds. No stridor. No wheezing, rhonchi or rales.  Chest:     Chest wall: No tenderness.  Musculoskeletal:        General: Swelling (1-2+ bilateral ankle edema) present.     Cervical back: Normal range of motion and neck supple.  Skin:    General: Skin is warm and dry.     Coloration: Skin is pale.  Neurological:     General: No focal deficit present.     Mental Status: He is alert and oriented to person, place, and time.     Motor: No weakness.     Gait: Gait normal.     Deep Tendon Reflexes: Reflexes abnormal.  Psychiatric:        Mood and Affect: Mood normal.        Behavior: Behavior normal.        Thought Content: Thought content normal.     Comments: More subdued than usual.  Not speaking.  Poor memory.  Slow response.     Adult ECG Report  Rate: 70;   Rhythm: atrial fibrillation and Wenckebach block. ; Poor R wave progression otherwise normal voltage, intervals and durations.  Narrative Interpretation: Stable  Recent Labs:  03/20/2021: TC 99, TG 62, HDL 41, LDL 44. Lab Results  Component Value Date   CHOL 103 07/19/2020   HDL 43.20 07/19/2020   LDLCALC 48 07/19/2020   TRIG 60.0 07/19/2020   CHOLHDL 2 07/19/2020   Lab Results  Component Value Date   CREATININE 2.58 (H) 04/18/2021   BUN 62 (H) 04/18/2021   NA 144 04/18/2021   K 4.5 04/18/2021   CL 112 (H) 04/18/2021   CO2 23 04/18/2021   CBC Latest Ref Rng & Units 04/18/2021 03/28/2021 03/22/2021  WBC 4.0 - 10.5 K/uL 12.5(H) 12.6(H) 12.2(H)  Hemoglobin 13.0 - 17.0 g/dL 8.7(L) 9.1(L) 7.1(L)  Hematocrit 39.0 - 52.0 % 28.6(L) 30.2(L) 24.4(L)  Platelets 150 - 400 K/uL 369 396 218    Lab Results  Component Value Date   HGBA1C 6.4 (A) 02/21/2021   Lab Results  Component Value Date   TSH 2.82 07/19/2020    ==================================================  COVID-19 Education: The signs and symptoms of COVID-19 were discussed with the patient and how to seek care for testing (follow up with PCP or arrange E-visit).    I spent a total of 34 minutes with the patient spent in direct patient consultation.  Additional time spent with chart review  / charting (studies, outside notes, etc): 17 min Total Time: 51  min  Current medicines are reviewed at length with the patient today.  (+/- concerns) none  This visit occurred during the SARS-CoV-2 public health emergency.  Safety protocols were in place, including screening questions prior to the visit, additional usage of staff PPE, and extensive cleaning of exam room while observing appropriate contact time as indicated for disinfecting solutions.  Notice: This dictation was prepared with Dragon dictation along with smaller phrase technology. Any transcriptional errors that result from this process are unintentional and may not be  corrected upon review.  Patient Instructions / Medication Changes & Studies & Tests Ordered   Patient Instructions  Medication Instructions:   No changes *If you need a refill on your cardiac medications before your next appointment, please call your pharmacy*   Lab Work:  Not needed   Testing/Procedures:  Not needed  Follow-Up: At [facility], you and your health needs are our  priority.  As part of our continuing mission to provide you with exceptional heart care, we have created designated Provider Care Teams.  These Care Teams include your primary Cardiologist (physician) and Advanced Practice Providers (APPs -  Physician Assistants and Nurse Practitioners) who all work together to provide you with the care you need, when you need it.     Your next appointment:   6 month(s)  The format for your next appointment:   In Person  Provider:   Glenetta Hew, MD    Other Instructions    Will discuss with Mr Bovey 's Hematologist  about the use a anticoagulant and contact you and facility     Studies Ordered:   Orders Placed This Encounter  Procedures   EKG 12-Lead      Glenetta Hew, M.D., M.S. Interventional Cardiologist   Pager # [phone] Phone # [phone] [address] [place], [place] [address]   Thank you for choosing Heartcare at [facility]!!

## 2021-04-24 ENCOUNTER — Encounter: Payer: HMO | Attending: Physician Assistant | Admitting: Physician Assistant

## 2021-04-24 ENCOUNTER — Other Ambulatory Visit: Payer: Self-pay

## 2021-04-24 DIAGNOSIS — L98492 Non-pressure 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: Secondary | ICD-10-CM | POA: Diagnosis not present

## 2021-04-24 DIAGNOSIS — L97822 Non-pressure chronic ulcer of other part of left lower leg with fat layer exposed: Secondary | ICD-10-CM | POA: Insufficient documentation

## 2021-04-24 DIAGNOSIS — E11622 Type 2 diabetes mellitus with other skin ulcer: Secondary | ICD-10-CM | POA: Insufficient documentation

## 2021-04-24 DIAGNOSIS — X58XXXA Exposure to other specified factors, initial encounter: Secondary | ICD-10-CM | POA: Diagnosis not present

## 2021-04-24 DIAGNOSIS — L97821 Non-pressure chronic ulcer of other part of left lower leg limited to breakdown of skin: Secondary | ICD-10-CM | POA: Insufficient documentation

## 2021-04-24 DIAGNOSIS — D508 Other iron deficiency anemias: Secondary | ICD-10-CM | POA: Diagnosis not present

## 2021-04-24 DIAGNOSIS — S81812A Laceration without foreign body, left lower leg, initial encounter: Secondary | ICD-10-CM | POA: Diagnosis not present

## 2021-04-24 DIAGNOSIS — S31821A Laceration without foreign body of left buttock, initial encounter: Secondary | ICD-10-CM | POA: Insufficient documentation

## 2021-04-24 DIAGNOSIS — N184 Chronic kidney disease, stage 4 (severe): Secondary | ICD-10-CM | POA: Diagnosis not present

## 2021-04-24 DIAGNOSIS — L97312 Non-pressure chronic ulcer of right ankle with fat layer exposed: Secondary | ICD-10-CM | POA: Diagnosis not present

## 2021-04-25 NOTE — Progress Notes (Addendum)
MIKLOS, BIDINGER ([id number]) Visit Report for 04/24/2021 Chief Complaint Document Details Patient Name: Clayton [name], Clayton [name]. Date of Service: 04/24/2021 12:45 PM Medical Record Number: [id number] Patient Account Number: [id number] Date of Birth/Sex: 1927/09/16 (87 y.o. M) Treating RN: Cornell Barman Primary Care Provider: Dorthy Cooler, Dibas Other Clinician: Referring Provider: Dorthy Cooler, Dibas Treating Provider/Extender: Skipper Cliche in Treatment: 0 Information Obtained from: Patient Chief Complaint Left leg, right ankle, and left gluteal ulcerations Electronic Signature(s) Signed: 04/24/2021 1:50:23 PM By: Worthy Keeler PA-C Entered By: Worthy Keeler on 04/24/2021 13:50:23 Clayton [name] ([id number]) -------------------------------------------------------------------------------- Debridement Details Patient Name: Clayton [name]. Date of Service: 04/24/2021 12:45 PM Medical Record Number: [id number] Patient Account Number: [id number] Date of Birth/Sex: 1928/06/03 (87 y.o. M) Treating RN: Cornell Barman Primary Care Provider: Dorthy Cooler, Dibas Other Clinician: Referring Provider: Dorthy Cooler, Dibas Treating Provider/Extender: Skipper Cliche in Treatment: 0 Debridement Performed for Wound #1 Right,Lateral Ankle Assessment: Performed By: Physician Tommie Sams., PA-C Debridement Type: Debridement Level of Consciousness (Pre- Awake and Alert procedure): Pre-procedure Verification/Time Out Yes - 14:00 Taken: Total Area Debrided (L x W): 2 (cm) x 1 (cm) = 2 (cm) Tissue and other material Slough, Subcutaneous, Skin: Dermis , Skin: Epidermis, Slough, Other: Calcium deposit debrided: Level: Skin/Subcutaneous Tissue Debridement Description: Excisional Instrument: Curette Bleeding: Minimum Hemostasis Achieved: Pressure Response to Treatment: Procedure was tolerated well Level of Consciousness (Post- Awake and Alert procedure): Post Debridement Measurements of Total Wound Length: (cm)  2 Width: (cm) 1 Depth: (cm) 0.5 Volume: (cm) 0.785 Character of Wound/Ulcer Post Debridement: Stable Post Procedure Diagnosis Same as Pre-procedure Electronic Signature(s) Signed: 04/24/2021 4:18:05 PM By: Worthy Keeler PA-C Signed: 04/24/2021 5:31:02 PM By: Gretta Cool, BSN, RN, CWS, Kim RN, BSN Entered By: Gretta Cool, BSN, RN, CWS, Kim on 04/24/2021 14:01:25 Clayton [name] ([id number]) -------------------------------------------------------------------------------- HPI Details Patient Name: Clayton [name]. Date of Service: 04/24/2021 12:45 PM Medical Record Number: [id number] Patient Account Number: [id number] Date of Birth/Sex: 06/22/1927 (87 y.o. M) Treating RN: Cornell Barman Primary Care Provider: Lujean Amel Other Clinician: Referring Provider: Dorthy Cooler, Dibas Treating Provider/Extender: Skipper Cliche in Treatment: 0 History of Present Illness HPI Description: 04/24/2021 upon evaluation today patient presents for initial inspection here in the clinic concerning an issue that has been going on with wounds on the right lateral ankle region, left lower leg, and left gluteal location. Unfortunately the patient does have significant anemia and had a blood transfusion in September. He has been having erythropoietin shots and lives [facility] in [place]. Most of these wounds are actually stated to be trauma/laceration in nature. The patient does have diabetes mellitus type 2, chronic kidney disease stage IV, and iron deficiency anemia. Electronic Signature(s) Signed: 04/25/2021 5:59:00 PM By: Worthy Keeler PA-C Previous Signature: 04/24/2021 4:17:24 PM Version By: Worthy Keeler PA-C Entered By: Worthy Keeler on 04/25/2021 17:59:00 Clayton [name] ([id number]) -------------------------------------------------------------------------------- Physical Exam Details Patient Name: Clayton [name] Date of Service: 04/24/2021 12:45 PM Medical Record Number: [id number] Patient  Account Number: [id number] Date of Birth/Sex: Jul 18, 1927 (87 y.o. M) Treating RN: Cornell Barman Primary Care Provider: Lujean Amel Other Clinician: Referring Provider: Dorthy Cooler, Dibas Treating Provider/Extender: Skipper Cliche in Treatment: 0 Constitutional patient is hypertensive.. pulse regular and within target range for patient.Marland Kitchen respirations regular, non-labored and within target range for patient.Marland Kitchen temperature within target range for patient.. Well-nourished and well-hydrated in no acute distress. Eyes conjunctiva clear no eyelid edema noted. pupils equal round and reactive to light and accommodation. Ears, Nose, Mouth,  and Throat no gross abnormality of ear auricles or external auditory canals. normal hearing noted during conversation. mucus membranes moist. Respiratory normal breathing without difficulty. Cardiovascular 1+ dorsalis pedis/posterior tibialis pulses. trace pitting edema of the bilateral lower extremities. Musculoskeletal normal gait and posture. no significant deformity or arthritic changes, no loss or range of motion, no clubbing. Psychiatric this patient is able to make decisions and demonstrates good insight into disease process. Alert and Oriented x 3. pleasant and cooperative. Notes Upon inspection patient's wounds again do not appear to be too badly overall and I am pleased in that regard. Most of them are fairly clean and I think overall collagen could be beneficial for him. I do not see any signs of active infection at this time which is also good news. He is not extremely swollen although there is maybe some minimal fluid retention noted in the way of peripheral edema. Electronic Signature(s) Signed: 04/25/2021 5:59:36 PM By: Worthy Keeler PA-C Entered By: Worthy Keeler on 04/25/2021 17:59:35 Clayton [name] ([id number]) -------------------------------------------------------------------------------- Physician Orders Details Patient Name: Clayton [name], Clayton [name]. Date of Service: 04/24/2021 12:45 PM Medical Record Number: [id number] Patient Account Number: [id number] Date of Birth/Sex: 11-29-1927 (87 y.o. M) Treating RN: Cornell Barman Primary Care Provider: Dorthy Cooler, Dibas Other Clinician: Referring Provider: Dorthy Cooler, Dibas Treating Provider/Extender: Skipper Cliche in Treatment: 0 Verbal / Phone Orders: No Diagnosis Coding ICD-10 Coding Code Description E11.622 Type 2 diabetes mellitus with other skin ulcer S81.812A Laceration without foreign body, left lower leg, initial encounter L97.312 Non-pressure chronic ulcer of right ankle with fat layer exposed L97.822 Non-pressure chronic ulcer of other part of left lower leg with fat layer exposed L97.821 Non-pressure chronic ulcer of other part of left lower leg limited to breakdown of skin S31.821A Laceration without foreign body of left buttock, initial encounter L98.492 Non-pressure 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 N18.4 Chronic kidney disease, stage 4 (severe) 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 Follow-up Appointments o Return Appointment in 2 weeks. [facility] o [facility] for wound care. May utilize formulary equivalent dressing for wound treatment orders unless otherwise specified. Home Health Nurse may visit PRN to address patientos wound care needs. - Monday and Thursday o Scheduled days for dressing changes to be completed; exception, patient has scheduled wound care visit that day. o **Please direct any NON-WOUND related issues/requests for orders to patient's Primary Care Physician. **If current dressing causes regression in wound condition, may D/C ordered dressing product/s and apply Normal Saline Moist Dressing daily until next [place] or Other MD appointment. **Notify [facility] of regression in wound condition at [phone]. Bathing/ Shower/ Hygiene o May  shower; gently cleanse wound with antibacterial soap, rinse and pat dry prior to dressing wounds Edema Control - Lymphedema / Segmental Compressive Device / Other o Patient to wear own compression stockings. Remove compression stockings every night before going to bed and put on every morning when getting up. Wound Treatment Wound #1 - Ankle Wound Laterality: Right, Lateral Primary Dressing: Prisma 4.34 (in) 2 x Per Week/30 Days Discharge Instructions: Moisten w/normal saline or sterile water; Cover wound as directed. Do not remove from wound bed. Secondary Dressing: Zetuvit Plus Silicone Border Dressing 4x4 (in/in) 2 x Per Week/30 Days Wound #2 - Lower Leg Wound Laterality: Left, Midline, Proximal Primary Dressing: Xeroform-HBD 2x2 (in/in) 2 x Per Week/30 Days Discharge Instructions: Apply Xeroform-HBD 2x2 (in/in) as directed Secured With:  Tegaderm Film 4x4 (in/in) 2 x Per Week/30 Days Discharge Instructions: Apply to wound bed Wound #3 - Lower Leg Wound Laterality: Left, Midline, Posterior Primary Dressing: Xeroform-HBD 2x2 (in/in) 2 x Per Week/30 Days Discharge Instructions: Apply Xeroform-HBD 2x2 (in/in) as directed Secured With: Tegaderm Film 4x4 (in/in) 2 x Per Week/30 Days AMAR, SIPPEL ([id number]) Discharge Instructions: Apply to wound bed Wound #4 - Gluteus Wound Laterality: Left Primary Dressing: Prisma 4.34 (in) 2 x Per Week/30 Days Discharge Instructions: Moisten w/normal saline or sterile water; Cover wound as directed. Do not remove from wound bed. Secondary Dressing: Zetuvit Plus Silicone Border Dressing 4x4 (in/in) 2 x Per Week/30 Days Electronic Signature(s) Signed: 04/24/2021 4:18:05 PM By: Worthy Keeler PA-C Signed: 04/24/2021 5:31:02 PM By: Gretta Cool, BSN, RN, CWS, Kim RN, BSN Entered By: Gretta Cool, BSN, RN, CWS, Kim on 04/24/2021 14:18:29 Clayton [name] ([id number]) -------------------------------------------------------------------------------- Problem List  Details Patient Name: Clayton [name], Clayton [name]. Date of Service: 04/24/2021 12:45 PM Medical Record Number: [id number] Patient Account Number: [id number] Date of Birth/Sex: [birth year] (87 y.o. M) Treating RN: Cornell Barman Primary Care Provider: Dorthy Cooler, Dibas Other Clinician: Referring Provider: Dorthy Cooler, Dibas Treating Provider/Extender: Skipper Cliche in Treatment: 0 Active Problems ICD-10 Encounter Code Description Active Date MDM Diagnosis E11.622 Type 2 diabetes mellitus with other skin ulcer 04/24/2021 No Yes S81.812A Laceration without foreign body, left lower leg, initial encounter 04/24/2021 No Yes L97.312 Non-pressure chronic ulcer of right ankle with fat layer exposed 04/24/2021 No Yes L97.822 Non-pressure chronic ulcer of other part of left lower leg with fat layer 04/24/2021 No Yes exposed L97.821 Non-pressure chronic ulcer of other part of left lower leg limited to 04/24/2021 No Yes breakdown of skin S31.821A Laceration without foreign body of left buttock, initial encounter 04/24/2021 No Yes L98.492 Non-pressure 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 04/24/2021 No Yes N18.4 Chronic kidney disease, stage 4 (severe) 04/24/2021 No Yes 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 04/24/2021 No Yes Inactive Problems Resolved Problems Electronic Signature(s) Signed: 04/24/2021 1:49:58 PM By: Worthy Keeler PA-C Entered By: Worthy Keeler on 04/24/2021 13:49:58 Clayton [name] ([id number]) -------------------------------------------------------------------------------- Progress Note Details Patient Name: Clayton [name]. Date of Service: 04/24/2021 12:45 PM Medical Record Number: [id number] Patient Account Number: [id number] Date of Birth/Sex: [birth year] (87 y.o. M) Treating RN: Cornell Barman Primary Care Provider: Dorthy Cooler, Dibas Other Clinician: Referring Provider: Dorthy Cooler, Dibas Treating Provider/Extender: Skipper Cliche in Treatment: 0 Subjective Chief  Complaint Information obtained from Patient Left leg, right ankle, and left gluteal ulcerations History of Present Illness (HPI) 04/24/2021 upon evaluation today patient presents for initial inspection here in the clinic concerning an issue that has been going on with wounds on the right lateral ankle region, left lower leg, and left gluteal location. Unfortunately the patient does have significant anemia and had a blood transfusion in September. He has been having erythropoietin shots and lives [facility] in [place]. Most of these wounds are actually stated to be trauma/laceration in nature. The patient does have diabetes mellitus type 2, chronic kidney disease stage IV, and iron deficiency anemia. Patient History Allergies penicillin Social History Former smoker - ended on 09/25/1973, Marital Status - Widowed, Alcohol Use - Rarely, Drug Use - No History, Caffeine Use - Daily. Medical History Eyes Patient has history of Cataracts - removed both Ear/Nose/Mouth/Throat Denies history of Chronic sinus problems/congestion Hematologic/Lymphatic Patient has history of Anemia - Blood transfusions, EPO shots Respiratory Patient has history of Chronic Obstructive Pulmonary Disease (COPD), Sleep Apnea -  C-pap Cardiovascular Patient has history of Arrhythmia - A-fib, Coronary Artery Disease, Myocardial Infarction - 2019 Endocrine Patient has history of Type II Diabetes - 30 years Genitourinary Denies history of End Stage Renal Disease Musculoskeletal Patient has history of Osteomyelitis Patient is treated with Insulin. Blood sugar is tested. Medical And Surgical History Notes Genitourinary CKD IV Review of Systems (ROS) Eyes Complains or has symptoms of Vision Changes. Ear/Nose/Mouth/Throat Denies complaints or symptoms of Difficult clearing ears, Sinusitis. Integumentary (Skin) Complains or has symptoms of Wounds. Musculoskeletal Denies complaints or symptoms of Muscle Pain,  Muscle Weakness. Neurologic Denies complaints or symptoms of Numbness/parasthesias, Focal/Weakness. Psychiatric Denies complaints or symptoms of Anxiety, Claustrophobia. Clayton [name], Clayton [name] ([id number]) Objective Constitutional patient is hypertensive.. pulse regular and within target range for patient.Marland Kitchen respirations regular, non-labored and within target range for patient.Marland Kitchen temperature within target range for patient.. Well-nourished and well-hydrated in no acute distress. Vitals Time Taken: 12:59 PM, Height: 69 in, Weight: 198 lbs, BMI: 29.2, Temperature: 97.9 F, Pulse: 55 bpm, Respiratory Rate: 16 breaths/min, Blood Pressure: 146/59 mmHg. Eyes conjunctiva clear no eyelid edema noted. pupils equal round and reactive to light and accommodation. Ears, Nose, Mouth, and Throat no gross abnormality of ear auricles or external auditory canals. normal hearing noted during conversation. mucus membranes moist. Respiratory normal breathing without difficulty. Cardiovascular 1+ dorsalis pedis/posterior tibialis pulses. trace pitting edema of the bilateral lower extremities. Musculoskeletal normal gait and posture. no significant deformity or arthritic changes, no loss or range of motion, no clubbing. Psychiatric this patient is able to make decisions and demonstrates good insight into disease process. Alert and Oriented x 3. pleasant and cooperative. General Notes: Upon inspection patient's wounds again do not appear to be too badly overall and I am pleased in that regard. Most of them are fairly clean and I think overall collagen could be beneficial for him. I do not see any signs of active infection at this time which is also good news. He is not extremely swollen although there is maybe some minimal fluid retention noted in the way of peripheral edema. Integumentary (Hair, Skin) Wound #1 status is Open. Original cause of wound was Gradually Appeared. The date acquired was: 12/26/2020. The wound  is located on the Right,Lateral Ankle. The wound measures 1.9cm length x 0.9cm width x 0.4cm depth; 1.343cm^2 area and 0.537cm^3 volume. There is Fat Layer (Subcutaneous Tissue) exposed. There is undermining starting at 12:00 and ending at 12:00 with a maximum distance of 0.5cm. There is a large amount of serous drainage noted. The wound margin is well defined and not attached to the wound base. There is no granulation within the wound bed. There is a large (67-100%) amount of necrotic tissue within the wound bed including Adherent Slough. Wound #2 status is Open. Original cause of wound was Trauma. The date acquired was: 04/01/2021. The wound is located on the Left,Proximal,Midline Lower Leg. The wound measures 2.5cm length x 2cm width x 0.1cm depth; 3.927cm^2 area and 0.393cm^3 volume. There is Fat Layer (Subcutaneous Tissue) exposed. There is no tunneling or undermining noted. There is a medium amount of serous drainage noted. There is no granulation within the wound bed. There is a large (67-100%) amount of necrotic tissue within the wound bed including Adherent Slough. Wound #3 status is Open. Original cause of wound was Skin Tear/Laceration. The date acquired was: 04/03/2021. The wound is located on the Left,Midline,Posterior Lower Leg. The wound measures 2.5cm length x 0.9cm width x 0.1cm depth; 1.767cm^2 area and 0.177cm^3 volume.  There is Fat Layer (Subcutaneous Tissue) exposed. There is no tunneling or undermining noted. There is a none present amount of drainage noted. There is large (67-100%) pink granulation within the wound bed. There is no necrotic tissue within the wound bed. Wound #4 status is Open. Original cause of wound was Skin Tear/Laceration. The date acquired was: 04/10/2021. The wound is located on the Left Gluteus. The wound measures 1.2cm length x 0.2cm width x 0.2cm depth; 0.188cm^2 area and 0.038cm^3 volume. There is no tunneling or undermining noted. There is a small  amount of serous drainage noted. There is medium (34-66%) red granulation within the wound bed. There is a medium (34-66%) amount of necrotic tissue within the wound bed including Adherent Slough. Assessment Active Problems ICD-10 Type 2 diabetes mellitus with other skin ulcer Laceration without foreign body, left lower leg, initial encounter Non-pressure chronic ulcer of right ankle with fat layer exposed Non-pressure chronic ulcer of other part of left lower leg with fat layer exposed Non-pressure chronic ulcer of other part of left lower leg limited to breakdown of skin Laceration without foreign body of left buttock, initial encounter Non-pressure 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 Chronic kidney disease, stage 4 (severe) Other iron deficiency anemias Clayton [name], Clayton [name] ([id number]) Procedures Wound #1 Pre-procedure diagnosis of Wound #1 is an Inflammatory located on the Right,Lateral Ankle . There was a Excisional Skin/Subcutaneous Tissue Debridement with a total area of 2 sq cm performed by Tommie Sams., PA-C. With the following instrument(s): Curette Material removed includes Subcutaneous Tissue, Slough, Skin: Dermis, Skin: Epidermis, and Other: Calcium deposit. No specimens were taken. A time out was conducted at 14:00, prior to the start of the procedure. A Minimum amount of bleeding was controlled with Pressure. The procedure was tolerated well. Post Debridement Measurements: 2cm length x 1cm width x 0.5cm depth; 0.785cm^3 volume. Character of Wound/Ulcer Post Debridement is stable. Post procedure Diagnosis Wound #1: Same as Pre-Procedure Plan Follow-up Appointments: Return Appointment in 2 weeks. Home Health: [facility] for wound care. May utilize formulary equivalent dressing for wound treatment orders unless otherwise specified. Home Health Nurse may visit PRN to address patient s wound care needs. -  Monday and Thursday Scheduled days for dressing changes to be completed; exception, patient has scheduled wound care visit that day. **Please direct any NON-WOUND related issues/requests for orders to patient's Primary Care Physician. **If current dressing causes regression in wound condition, may D/C ordered dressing product/s and apply Normal Saline Moist Dressing daily until next [place] or Other MD appointment. **Notify [facility] of regression in wound condition at [phone]. Bathing/ Shower/ Hygiene: May shower; gently cleanse wound with antibacterial soap, rinse and pat dry prior to dressing wounds Edema Control - Lymphedema / Segmental Compressive Device / Other: Patient to wear own compression stockings. Remove compression stockings every night before going to bed and put on every morning when getting up. WOUND #1: - Ankle Wound Laterality: Right, Lateral Primary Dressing: Prisma 4.34 (in) 2 x Per Week/30 Days Discharge Instructions: Moisten w/normal saline or sterile water; Cover wound as directed. Do not remove from wound bed. Secondary Dressing: Zetuvit Plus Silicone Border Dressing 4x4 (in/in) 2 x Per Week/30 Days WOUND #2: - Lower Leg Wound Laterality: Left, Midline, Proximal Primary Dressing: Xeroform-HBD 2x2 (in/in) 2 x Per Week/30 Days Discharge Instructions: Apply Xeroform-HBD 2x2 (in/in) as directed Secured With: Tegaderm Film 4x4 (in/in) 2 x Per Week/30  Days Discharge Instructions: Apply to wound bed WOUND #3: - Lower Leg Wound Laterality: Left, Midline, Posterior Primary Dressing: Xeroform-HBD 2x2 (in/in) 2 x Per Week/30 Days Discharge Instructions: Apply Xeroform-HBD 2x2 (in/in) as directed Secured With: Tegaderm Film 4x4 (in/in) 2 x Per Week/30 Days Discharge Instructions: Apply to wound bed WOUND #4: - Gluteus Wound Laterality: Left Primary Dressing: Prisma 4.34 (in) 2 x Per Week/30 Days Discharge Instructions: Moisten w/normal saline or  sterile water; Cover wound as directed. Do not remove from wound bed. Secondary Dressing: Zetuvit Plus Silicone Border Dressing 4x4 (in/in) 2 x Per Week/30 Days 1. Based on what I am seeing currently I am going initiate treatment with a silver collagen dressing which I think is probably can to be the best way to go and the patient is in agreement with that plan. 2. I am also can recommend that we go ahead and initiate Xeroform to the lower leg which I think is good to help here. We can use Tegaderm over top of the leg location which I think will be beneficial. 3. I am also can recommend the patient continue to monitor for any signs of worsening or infection obviously if anything changes we should be alerted as soon as possible otherwise we will plan to see him back for reevaluation in 2 weeks. We will see patient back for reevaluation in 2 weeks here in the clinic. If anything worsens or changes patient will contact our office for additional recommendations. Electronic Signature(s) Signed: 04/25/2021 6:00:31 PM By: Worthy Keeler PA-C Entered By: Worthy Keeler on 04/25/2021 18:00:31 Clayton [name] ([id number]) -------------------------------------------------------------------------------- ROS/PFSH Details Patient Name: Clayton [name] Date of Service: 04/24/2021 12:45 PM Medical Record Number: [id number] Patient Account Number: [id number] Date of Birth/Sex: 06-12-1928 (87 y.o. M) Treating RN: Cornell Barman Primary Care Provider: Dorthy Cooler, Dibas Other Clinician: Referring Provider: Dorthy Cooler, Dibas Treating Provider/Extender: Skipper Cliche in Treatment: 0 Eyes Complaints and Symptoms: Positive for: Vision Changes Medical History: Positive for: Cataracts - removed both Ear/Nose/Mouth/Throat Complaints and Symptoms: Negative for: Difficult clearing ears; Sinusitis Medical History: Negative for: Chronic sinus problems/congestion Integumentary (Skin) Complaints and  Symptoms: Positive for: Wounds Musculoskeletal Complaints and Symptoms: Negative for: Muscle Pain; Muscle Weakness Medical History: Positive for: Osteomyelitis Neurologic Complaints and Symptoms: Negative for: Numbness/parasthesias; Focal/Weakness Psychiatric Complaints and Symptoms: Negative for: Anxiety; Claustrophobia Hematologic/Lymphatic Medical History: Positive for: Anemia - Blood transfusions, EPO shots Respiratory Medical History: Positive for: Chronic Obstructive Pulmonary Disease (COPD); Sleep Apnea - C-pap Cardiovascular Medical History: Positive for: Arrhythmia - A-fib; Coronary Artery Disease; Myocardial Infarction - 2019 Endocrine Medical History: Positive for: Type II Diabetes - 30 years Treated with: Insulin Clayton [name], Clayton [name] ([id number]) Blood sugar tested every day: Yes Tested : Genitourinary Medical History: Negative for: End Stage Renal Disease Past Medical History Notes: CKD IV Oncologic HBO Extended History Items Eyes: Cataracts Immunizations Pneumococcal Vaccine: Received Pneumococcal Vaccination: Yes Received Pneumococcal Vaccination On or After 60th Birthday: No Implantable Devices None Family and Social History Former smoker - ended on 09/25/1973; Marital Status - Widowed; Alcohol Use: Rarely; Drug Use: No History; Caffeine Use: Daily Electronic Signature(s) Signed: 04/24/2021 4:18:05 PM By: Worthy Keeler PA-C Signed: 04/24/2021 5:31:02 PM By: Gretta Cool, BSN, RN, CWS, Kim RN, BSN Entered By: Gretta Cool, BSN, RN, CWS, Kim on 04/24/2021 13:54:49 Clayton [name] ([id number]) -------------------------------------------------------------------------------- SuperBill Details Patient Name: Clayton [name], Clayton [name]. Date of Service: 04/24/2021 Medical Record Number: [id number] Patient Account Number: [id number] Date of Birth/Sex: 1928/05/25 (87 y.o. M) Treating RN: Cornell Barman  Primary Care Provider: Dorthy Cooler, [place] Other Clinician: Referring Provider: Dorthy Cooler,  [place] Treating Provider/Extender: Skipper Cliche in Treatment: 0 Diagnosis Coding ICD-10 Codes Code Description E11.622 Type 2 diabetes mellitus with other skin ulcer S81.812A Laceration without foreign body, left lower leg, initial encounter L97.312 Non-pressure chronic ulcer of right ankle with fat layer exposed L97.822 Non-pressure chronic ulcer of other part of left lower leg with fat layer exposed L97.821 Non-pressure chronic ulcer of other part of left lower leg limited to breakdown of skin S31.821A Laceration without foreign body of left buttock, initial encounter L98.492 Non-pressure 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 N18.4 Chronic kidney disease, stage 4 (severe) 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 Facility Procedures CPT4 Code: 76546503 Description: [place] VISIT-LEV 3 EST PT Modifier: Quantity: 1 CPT4 Code: 54656812 Description: 11042 - DEB SUBQ TISSUE 20 SQ CM/< Modifier: Quantity: 1 CPT4 Code: Description: ICD-10 Diagnosis Description X51.700 Non-pressure chronic ulcer of right ankle with fat layer exposed Modifier: Quantity: Physician Procedures CPT4 Code: 1749449 Description: 67591 - WC PHYS LEVEL 4 - NEW PT Modifier: 25 Quantity: 1 CPT4 Code: Description: ICD-10 Diagnosis Description E11.622 Type 2 diabetes mellitus with other skin ulcer S81.812A Laceration without foreign body, left lower leg, initial encounter L97.312 Non-pressure chronic ulcer of right ankle with fat layer exposed L97.822  Non-pressure chronic ulcer of other part of left lower leg with fat layer Modifier: exposed Quantity: CPT4 Code: 6384665 Description: 99357 - WC PHYS SUBQ TISS 20 SQ CM Modifier: Quantity: 1 CPT4 Code: Description: ICD-10 Diagnosis Description S17.793 Non-pressure chronic ulcer of right ankle with fat layer exposed Modifier: Quantity: Electronic Signature(s) Signed: 04/24/2021 4:17:51 PM By: Worthy Keeler PA-C Entered By:  Worthy Keeler on 04/24/2021 16:17:51

## 2021-04-25 NOTE — Progress Notes (Signed)
OZ, GAMMEL ([id number]) Visit Report for 04/24/2021 Abuse/Suicide Risk Screen Details Patient Name: Clayton [name], Clayton [name]. Date of Service: 04/24/2021 12:45 PM Medical Record Number: [id number] Patient Account Number: [id number] Date of Birth/Sex: 1927/07/08 (87 y.o. M) Treating RN: Cornell Barman Primary Care Cadon Raczka: Dorthy Cooler, Dibas Other Clinician: Referring Ashtan Laton: Dorthy Cooler, [place]ib[place] Treating Govind Furey/Extender: Skipper Cliche in Treatment: 0 Abuse/Suicide Risk Screen Items Answer ABUSE RISK SCREEN: Has anyone close to you tried to hurt or harm you recentlyo No Do you feel uncomfortable with anyone in your familyo No Has anyone forced you do things that you didnot want to doo No Electronic Signature(s) Signed: 04/24/2021 5:31:02 PM By: Gretta Cool, BSN, RN, CWS, Kim RN, BSN Entered By: Gretta Cool, BSN, RN, CWS, Kim on 04/24/2021 13:09:03 Clayton [name] ([id number]) -------------------------------------------------------------------------------- Activities of Daily Living Details Patient Name: Clayton [name]. Date of Service: 04/24/2021 12:45 PM Medical Record Number: [id number] Patient Account Number: [id number] Date of Birth/Sex: [birth year] (87 y.o. M) Treating RN: Cornell Barman Primary Care Nicolae Vasek: Dorthy Cooler, [place]ibas Other Clinician: Referring Shizuo Biskup: Dorthy Cooler, [place]ib[place] Treating Mataya Kilduff/Extender: Skipper Cliche in Treatment: 0 Activities of Daily Living Items Answer Activities of Daily Living (Please select one for each item) Drive Automobile Not Able Take Medications Need Assistance Use Telephone Need Assistance Care for Appearance Need Assistance Use Toilet Need Assistance Bath / Shower Need Assistance Dress Self Need Assistance Feed Self Need Assistance Walk Need Assistance Get In / Out Bed Need Assistance Housework Need Assistance Prepare Meals Need Assistance Handle Money Need Assistance Shop for Self Need Assistance Electronic Signature(s) Signed: 04/24/2021 5:31:02 PM By:  Gretta Cool, BSN, RN, CWS, Kim RN, BSN Entered By: Gretta Cool, BSN, RN, CWS, Kim on 04/24/2021 13:09:38 Clayton [name] ([id number]) -------------------------------------------------------------------------------- Education Screening Details Patient Name: Clayton [name] Date of Service: 04/24/2021 12:45 PM Medical Record Number: [id number] Patient Account Number: [id number] Date of Birth/Sex: 09-11-1927 (87 y.o. M) Treating RN: Cornell Barman Primary Care Valerio Pinard: Dorthy Cooler, [place]ibas Other Clinician: Referring Eria Lozoya: Dorthy Cooler, [place] Treating Samra Pesch/Extender: Skipper Cliche in Treatment: 0 Primary Learner Assessed: Patient Learning Preferences/Education Level/Primary Language Learning Preference: Explanation, Demonstration, Communication Board Highest Education Level: College or Above Preferred Language: English Cognitive Barrier Language Barrier: No Translator Needed: No Memory Deficit: No Emotional Barrier: No Cultural/Religious Beliefs Affecting Medical Care: No Physical Barrier Impaired Vision: Yes Glasses Impaired Hearing: No Decreased Hand dexterity: No Knowledge/Comprehension Knowledge Level: High Comprehension Level: High Ability to understand written instructions: High Ability to understand verbal instructions: High Motivation Anxiety Level: Calm Cooperation: Cooperative Education Importance: Acknowledges Need Interest in Health Problems: Asks Questions Perception: Coherent Willingness to Engage in Self-Management High Activities: Readiness to Engage in Self-Management High Activities: Electronic Signature(s) Signed: 04/24/2021 5:31:02 PM By: Gretta Cool, BSN, RN, CWS, Kim RN, BSN Entered By: Gretta Cool, BSN, RN, CWS, Kim on 04/24/2021 13:10:17 Clayton [name] ([id number]) -------------------------------------------------------------------------------- Fall Risk Assessment Details Patient Name: Clayton [name]. Date of Service: 04/24/2021 12:45 PM Medical Record Number:  [id number] Patient Account Number: [id number] Date of Birth/Sex: June 21, 1927 (87 y.o. M) Treating RN: Cornell Barman Primary Care Daya Dutt: Dorthy Cooler, [place]ibas Other Clinician: Referring Makyia Erxleben: Dorthy Cooler, [place] Treating Dimitra Woodstock/Extender: Skipper Cliche in Treatment: 0 Fall Risk Assessment Items Have you had 2 or more falls in the last 12 monthso 0 No Have you had any fall that resulted in injury in the last 12 monthso 0 No FALLS RISK SCREEN History of falling - immediate or within 3 months 0 No Secondary diagnosis (Do you have 2 or more medical diagnoseso) 15 Yes Ambulatory aid None/bed rest/wheelchair/nurse  0 No Crutches/cane/walker 15 Yes Furniture 0 No Intravenous therapy Access/Saline/Heparin Lock 0 No Gait/Transferring Normal/ bed rest/ wheelchair 0 No Weak (short steps with or without shuffle, stooped but able to lift head while walking, may 10 Yes seek support from furniture) Impaired (short steps with shuffle, may have difficulty arising from chair, head down, impaired 0 No balance) Mental Status Oriented to own ability 0 No Electronic Signature(s) Signed: 04/24/2021 5:31:02 PM By: Gretta Cool, BSN, RN, CWS, Kim RN, BSN Entered By: Gretta Cool, BSN, RN, CWS, Kim on 04/24/2021 13:11:34 Clayton [name] ([id number]) -------------------------------------------------------------------------------- Foot Assessment Details Patient Name: Clayton [name]. Date of Service: 04/24/2021 12:45 PM Medical Record Number: [id number] Patient Account Number: [id number] Date of Birth/Sex: 07/21/1927 (87 y.o. M) Treating RN: Cornell Barman Primary Care Jakera Beaupre: Dorthy Cooler, [place]ib[place] Other Clinician: Referring Miquan Tandon: Dorthy Cooler, [place] Treating Rosey Eide/Extender: Skipper Cliche in Treatment: 0 Foot Assessment Items Site Locations + = Sensation present, - = Sensation absent, C = Callus, U = Ulcer R = Redness, W = Warmth, M = Maceration, PU = Pre-ulcerative lesion F = Fissure, S = Swelling, D =  Dryness Assessment Right: Left: Other Deformity: No No Prior Foot Ulcer: No No Prior Amputation: No No Charcot Joint: No No Ambulatory Status: Ambulatory With Help Assistance Device: Wheelchair Gait: Administrator, arts) Signed: 04/24/2021 5:31:02 PM By: Gretta Cool, BSN, RN, CWS, Kim RN, BSN Entered By: Gretta Cool, BSN, RN, CWS, Kim on 04/24/2021 13:27:09 Clayton [name] ([id number]) -------------------------------------------------------------------------------- Nutrition Risk Screening Details Patient Name: Clayton [name]. Date of Service: 04/24/2021 12:45 PM Medical Record Number: [id number] Patient Account Number: [id number] Date of Birth/Sex: 12/06/1927 (87 y.o. M) Treating RN: Cornell Barman Primary Care Elle Vezina: Dorthy Cooler, [place]ibas Other Clinician: Referring Analys Ryden: Dorthy Cooler, [place] Treating Jaqwan Wieber/Extender: Skipper Cliche in Treatment: 0 Height (in): 69 Weight (lbs): 198 Body Mass Index (BMI): 29.2 Nutrition Risk Screening Items Score Screening NUTRITION RISK SCREEN: I have an illness or condition that made me change the kind and/or amount of food I eat 0 No I eat fewer than two meals per day 0 No I eat few fruits and vegetables, or milk products 0 No I have three or more drinks of beer, liquor or wine almost every day 0 No I have tooth or mouth problems that make it hard for me to eat 0 No I don't always have enough money to buy the food I need 0 No I eat alone most of the time 0 No I take three or more different prescribed or over-the-counter drugs a day 1 Yes Without wanting to, I have lost or gained 10 pounds in the last six months 0 No I am not always physically able to shop, cook and/or feed myself 0 No Nutrition Protocols Good Risk Protocol 0 No interventions needed Moderate Risk Protocol High Risk Proctocol Risk Level: Good Risk Score: 1 Electronic Signature(s) Signed: 04/24/2021 5:31:02 PM By: Gretta Cool, BSN, RN, CWS, Kim RN, BSN Entered By: Gretta Cool, BSN, RN,  CWS, Kim on 04/24/2021 13:11:41

## 2021-04-25 NOTE — Progress Notes (Signed)
[name], Clayton ([id number]) Visit Report for 04/24/2021 Allergy List Details Patient Name: Clayton [name], Clayton [name]. Date of Service: 04/24/2021 12:45 PM Medical Record Number: [id number] Patient Account Number: [id number] Date of Birth/Sex: 10-Jun-1928 (87 y.o. M) Treating RN: Cornell Barman Primary Care Ashby Leflore: Lujean Amel Other Clinician: Referring Cherelle Midkiff: Dorthy Cooler, Dibas Treating Breshay Ilg/Extender: Skipper Cliche in Treatment: 0 Allergies Active Allergies penicillin Allergy Notes Electronic Signature(s) Signed: 04/24/2021 5:31:02 PM By: Gretta Cool, BSN, RN, CWS, Kim RN, BSN Entered By: Gretta Cool, BSN, RN, CWS, Kim on 04/24/2021 13:00:49 [name] ([id number]) -------------------------------------------------------------------------------- Arrival Information Details Patient Name: Clayton, [name]. Date of Service: 04/24/2021 12:45 PM Medical Record Number: [id number] Patient Account Number: [id number] Date of Birth/Sex: 11/01/1927 (87 y.o. M) Treating RN: Cornell Barman Primary Care Dakarri Kessinger: Dorthy Cooler, Dibas Other Clinician: Referring Adara Kittle: Dorthy Cooler, Dibas Treating Vyncent Overby/Extender: Skipper Cliche in Treatment: 0 Visit Information Patient Arrived: Wheel Chair Arrival Time: 12:56 Accompanied By: daughter Transfer Assistance: Manual Patient Identification Verified: Yes Secondary Verification Process Completed: Yes Patient Has Alerts: Yes Patient Alerts: Patient on Blood Thinner Type II Diabetic 81mg  aspirin Non-compressible >220 Electronic Signature(s) Signed: 04/24/2021 5:31:02 PM By: Gretta Cool, BSN, RN, CWS, Kim RN, BSN Entered By: Gretta Cool, BSN, RN, CWS, Kim on 04/24/2021 13:42:56 [name] ([id number]) -------------------------------------------------------------------------------- Clinic Level of Care Assessment Details Patient Name: Clayton, [name]. Date of Service: 04/24/2021 12:45 PM Medical Record Number: [id number] Patient Account Number: [id number] Date of  Birth/Sex: 08/07/1927 (87 y.o. M) Treating RN: Cornell Barman Primary Care Victora Irby: Dorthy Cooler, Dibas Other Clinician: Referring Tarun Patchell: Dorthy Cooler, Dibas Treating Salina Stanfield/Extender: Skipper Cliche in Treatment: 0 Clinic Level of Care Assessment Items TOOL 1 Quantity Score []  - Use when EandM and Procedure is performed on INITIAL visit 0 ASSESSMENTS - Nursing Assessment / Reassessment X - General Physical Exam (combine w/ comprehensive assessment (listed just below) when performed on new 1 20 pt. evals) X- 1 25 Comprehensive Assessment (HX, ROS, Risk Assessments, Wounds Hx, etc.) ASSESSMENTS - Wound and Skin Assessment / Reassessment []  - Dermatologic / Skin Assessment (not related to wound area) 0 ASSESSMENTS - Ostomy and/or Continence Assessment and Care []  - Incontinence Assessment and Management 0 []  - 0 Ostomy Care Assessment and Management (repouching, etc.) PROCESS - Coordination of Care X - Simple Patient / Family Education for ongoing care 1 15 []  - 0 Complex (extensive) Patient / Family Education for ongoing care X- 1 10 Staff obtains Programmer, systems, Records, Test Results / Process Orders []  - 0 Staff telephones HHA, Nursing Homes / Clarify orders / etc []  - 0 Routine Transfer to another Facility (non-emergent condition) []  - 0 Routine Hospital Admission (non-emergent condition) X- 1 15 New Admissions / Biomedical engineer / Ordering NPWT, Apligraf, etc. []  - 0 Emergency Hospital Admission (emergent condition) PROCESS - Special Needs []  - Pediatric / Minor Patient Management 0 []  - 0 Isolation Patient Management []  - 0 Hearing / Language / Visual special needs []  - 0 Assessment of Community assistance (transportation, D/C planning, etc.) []  - 0 Additional assistance / Altered mentation []  - 0 Support Surface(s) Assessment (bed, cushion, seat, etc.) INTERVENTIONS - Miscellaneous []  - External ear exam 0 []  - 0 Patient Transfer (multiple staff / Civil Service fast streamer /  Similar devices) []  - 0 Simple Staple / Suture removal (25 or less) []  - 0 Complex Staple / Suture removal (26 or more) []  - 0 Hypo/Hyperglycemic Management (do not check if billed separately) X- 1 15 Ankle / Brachial Index (ABI) - do not check if billed separately  Has the patient been seen at the hospital within the last three years: Yes Total Score: 100 Level Of Care: New/Established - Level 3 MOHAMADOU, MACIVER ([id number]) Electronic Signature(s) Signed: 04/24/2021 5:31:02 PM By: Gretta Cool, BSN, RN, CWS, Kim RN, BSN Entered By: Gretta Cool, BSN, RN, CWS, Kim on 04/24/2021 14:19:26 [name] ([id number]) -------------------------------------------------------------------------------- Encounter Discharge Information Details Patient Name: Clayton, [name]. Date of Service: 04/24/2021 12:45 PM Medical Record Number: [id number] Patient Account Number: [id number] Date of Birth/Sex: 06-May-1928 (87 y.o. M) Treating RN: Cornell Barman Primary Care Krystl Wickware: Dorthy Cooler, Dibas Other Clinician: Referring Lionardo Haze: Dorthy Cooler, Dibas Treating Yanessa Hocevar/Extender: Skipper Cliche in Treatment: 0 Encounter Discharge Information Items Post Procedure Vitals Discharge Condition: Stable Temperature (F): 97.9 Ambulatory Status: Wheelchair Pulse (bpm): 55 Discharge Destination: Home Respiratory Rate (breaths/min): 18 Transportation: Private Auto Blood Pressure (mmHg): 146/59 Accompanied By: daughter Schedule Follow-up Appointment: Yes Clinical Summary of Care: Electronic Signature(s) Signed: 04/24/2021 5:31:02 PM By: Gretta Cool, BSN, RN, CWS, Kim RN, BSN Entered By: Gretta Cool, BSN, RN, CWS, Kim on 04/24/2021 14:21:05 [name] ([id number]) -------------------------------------------------------------------------------- Lower Extremity Assessment Details Patient Name: Clayton, [name]. Date of Service: 04/24/2021 12:45 PM Medical Record Number: [id number] Patient Account Number: [id number] Date of Birth/Sex:  01/15/1928 (87 y.o. M) Treating RN: Cornell Barman Primary Care Abhi Moccia: Dorthy Cooler, Dibas Other Clinician: Referring Jaxson Keener: Dorthy Cooler, Dibas Treating Lyndel Sarate/Extender: Skipper Cliche in Treatment: 0 Edema Assessment Assessed: [Left: Yes] [Right: Yes] [Left: Edema] [Right: :] Vascular Assessment Pulses: Dorsalis Pedis Palpable: [Left:Yes] [Right:Yes] Notes Patient is non-compressible >220 Electronic Signature(s) Signed: 04/24/2021 5:31:02 PM By: Gretta Cool, BSN, RN, CWS, Kim RN, BSN Entered By: Gretta Cool, BSN, RN, CWS, Kim on 04/24/2021 13:42:24 [name] ([id number]) -------------------------------------------------------------------------------- Multi Wound Chart Details Patient Name: [name]. Date of Service: 04/24/2021 12:45 PM Medical Record Number: [id number] Patient Account Number: [id number] Date of Birth/Sex: 05/03/1928 (87 y.o. M) Treating RN: Cornell Barman Primary Care Teddy Rebstock: Dorthy Cooler, Dibas Other Clinician: Referring Consuelo Suthers: Dorthy Cooler, Dibas Treating Wilmer Santillo/Extender: Skipper Cliche in Treatment: 0 Vital Signs Height(in): 30 Pulse(bpm): 31 Weight(lbs): 198 Blood Pressure(mmHg): 146/59 Body Mass Index(BMI): 29 Temperature(F): 97.9 Respiratory Rate(breaths/min): 16 Photos: Wound Location: Right, Lateral Ankle Left, Proximal, Midline Lower Leg Left, Midline, Posterior Lower Leg Wounding Event: Gradually Appeared Trauma Skin Tear/Laceration Primary Etiology: Inflammatory Trauma, Other Trauma, Other Comorbid History: Cataracts, Anemia, Chronic Cataracts, Anemia, Chronic Cataracts, Anemia, Chronic Obstructive Pulmonary Disease Obstructive Pulmonary Disease Obstructive Pulmonary Disease (COPD), Sleep Apnea, Arrhythmia, (COPD), Sleep Apnea, Arrhythmia, (COPD), Sleep Apnea, Arrhythmia, Coronary Artery Disease, Coronary Artery Disease, Coronary Artery Disease, Myocardial Infarction, Type II Myocardial Infarction, Type II Myocardial Infarction, Type II Diabetes  Diabetes Diabetes Date Acquired: 12/26/2020 04/01/2021 04/03/2021 Weeks of Treatment: 0 0 0 Wound Status: Open Open Open Measurements L x W x D (cm) 1.9x0.9x0.4 2.5x2x0.1 2.5x0.9x0.1 Area (cm) : 1.343 3.927 1.767 Volume (cm) : 0.537 0.393 0.177 % Reduction in Area: 0.00% 0.00% 0.00% % Reduction in Volume: 0.00% 0.00% 0.00% Starting Position 1 (o'clock): 12 Ending Position 1 (o'clock): 12 Maximum Distance 1 (cm): 0.5 Undermining: Yes No No Classification: Full Thickness Without Exposed Full Thickness Without Exposed Partial Thickness Support Structures Support Structures Exudate Amount: Large Medium None Present Exudate Type: Serous Serous N/A Exudate Color: amber amber N/A Wound Margin: Well defined, not attached N/A N/A Granulation Amount: None Present (0%) None Present (0%) Large (67-100%) Granulation Quality: N/A N/A Pink Necrotic Amount: Large (67-100%) Large (67-100%) None Present (0%) Exposed Structures: Fat Layer (Subcutaneous Tissue): Fat Layer (Subcutaneous Tissue): Fat Layer (Subcutaneous Tissue): Yes Yes  Yes Fascia: No Fascia: No Fascia: No Tendon: No Tendon: No Tendon: No Muscle: No Muscle: No Muscle: No Joint: No Joint: No Joint: No Bone: No Bone: No Bone: No Epithelialization: N/A None None Wound Number: 4 N/A N/A Photos: N/A N/A RA, PFIESTER ([id number]) Wound Location: Left Gluteus N/A N/A Wounding Event: Skin Tear/Laceration N/A N/A Primary Etiology: Skin Tear N/A N/A Comorbid History: Cataracts, Anemia, Chronic N/A N/A Obstructive Pulmonary Disease (COPD), Sleep Apnea, Arrhythmia, Coronary Artery Disease, Myocardial Infarction, Type II Diabetes Date Acquired: 04/10/2021 N/A N/A Weeks of Treatment: 0 N/A N/A Wound Status: Open N/A N/A Measurements L x W x D (cm) 1.2x0.2x0.2 N/A N/A Area (cm) : 0.188 N/A N/A Volume (cm) : 0.038 N/A N/A % Reduction in Area: N/A N/A N/A % Reduction in Volume: N/A N/A N/A Undermining: No N/A  N/A Classification: Full Thickness Without Exposed N/A N/A Support Structures Exudate Amount: Small N/A N/A Exudate Type: Serous N/A N/A Exudate Color: amber N/A N/A Wound Margin: N/A N/A N/A Granulation Amount: Medium (34-66%) N/A N/A Granulation Quality: Red N/A N/A Necrotic Amount: Medium (34-66%) N/A N/A Exposed Structures: Fascia: No N/A N/A Fat Layer (Subcutaneous Tissue): No Tendon: No Muscle: No Joint: No Bone: No Epithelialization: N/A N/A N/A Treatment Notes Electronic Signature(s) Signed: 04/24/2021 5:31:02 PM By: Gretta Cool, BSN, RN, CWS, Kim RN, BSN Entered By: Gretta Cool, BSN, RN, CWS, Kim on 04/24/2021 14:00:05 [name] ([id number]) -------------------------------------------------------------------------------- Multi-Disciplinary Care Plan Details Patient Name: DOLTON, SHAKER. Date of Service: 04/24/2021 12:45 PM Medical Record Number: [id number] Patient Account Number: [id number] Date of Birth/Sex: 02/07/1928 (87 y.o. M) Treating RN: Cornell Barman Primary Care Oyuki Hogan: Dorthy Cooler, Dibas Other Clinician: Referring Natascha Edmonds: Dorthy Cooler, Dibas Treating Endy Easterly/Extender: Skipper Cliche in Treatment: 0 Active Inactive Abuse / Safety / Falls / Self Care Management Nursing Diagnoses: Potential for falls Goals: Patient will remain injury free related to falls Date Initiated: 04/24/2021 Target Resolution Date: 05/24/2021 Goal Status: Active Interventions: Provide education on fall prevention Notes: Medication Nursing Diagnoses: Knowledge deficit related to medication safety: actual or potential Goals: Patient/caregiver will demonstrate understanding of all current medications Date Initiated: 04/24/2021 Target Resolution Date: 05/24/2021 Goal Status: Active Interventions: Assess for medication contraindications each visit where new medications are prescribed Assess patient/caregiver ability to manage medication regimen upon admission and as  needed Patient/Caregiver given reconciled medication list upon admission, changes in medications and discharge from the [facility] Provide education on medication safety Notes: Necrotic Tissue Nursing Diagnoses: Impaired tissue integrity related to necrotic/devitalized tissue Knowledge deficit related to management of necrotic/devitalized tissue Goals: Necrotic/devitalized tissue will be minimized in the wound bed Date Initiated: 04/24/2021 Target Resolution Date: 05/24/2021 Goal Status: Active Patient/caregiver will verbalize understanding of reason and process for debridement of necrotic tissue Date Initiated: 04/24/2021 Target Resolution Date: 05/24/2021 Goal Status: Active Interventions: Assess patient pain level pre-, during and post procedure and prior to discharge Provide education on necrotic tissue and debridement process Treatment Activities: Apply topical anesthetic as ordered : 04/24/2021 Excisional debridement : 04/24/2021 [name] ([id number]) Notes: Orientation to the Wound Care Program Nursing Diagnoses: Knowledge deficit related to the wound healing center program Goals: Patient/caregiver will verbalize understanding of the [facility] Program Date Initiated: 04/24/2021 Target Resolution Date: 05/24/2021 Goal Status: Active Interventions: Provide education on orientation to the wound center Notes: Soft Tissue Infection Nursing Diagnoses: Impaired tissue integrity Knowledge deficit related to disease process and management Knowledge deficit related to home infection control: handwashing, handling of soiled dressings, supply storage Potential for infection:  soft tissue Goals: Signs and symptoms of infection will be recognized early to allow for prompt treatment Date Initiated: 04/24/2021 Target Resolution Date: 05/25/2021 Goal Status: Active Interventions: Assess signs and symptoms of infection every visit Notes: Wound/Skin Impairment Nursing  Diagnoses: Impaired tissue integrity Goals: Patient/caregiver will verbalize understanding of skin care regimen Date Initiated: 04/24/2021 Target Resolution Date: 04/24/2021 Goal Status: Active Ulcer/skin breakdown will have a volume reduction of 30% by week 4 Date Initiated: 04/24/2021 Target Resolution Date: 05/24/2021 Goal Status: Active Interventions: Assess ulceration(s) every visit Treatment Activities: Skin care regimen initiated : 04/24/2021 Topical wound management initiated : 04/24/2021 Notes: Electronic Signature(s) Signed: 04/24/2021 5:31:02 PM By: Gretta Cool, BSN, RN, CWS, Kim RN, BSN Entered By: Gretta Cool, BSN, RN, CWS, Kim on 04/24/2021 13:59:49 [name] ([id number]) -------------------------------------------------------------------------------- Pain Assessment Details Patient Name: [name]. Date of Service: 04/24/2021 12:45 PM Medical Record Number: [id number] Patient Account Number: [id number] Date of Birth/Sex: 1927/06/25 (87 y.o. M) Treating RN: Cornell Barman Primary Care Shaw Dobek: Lujean Amel Other Clinician: Referring Emmaline Wahba: Dorthy Cooler, Dibas Treating Kayleeann Huxford/Extender: Skipper Cliche in Treatment: 0 Active Problems Location of Pain Severity and Description of Pain Patient Has Paino No Site Locations Pain Management and Medication Current Pain Management: Notes Patient denies pain at this time. Electronic Signature(s) Signed: 04/24/2021 5:31:02 PM By: Gretta Cool, BSN, RN, CWS, Kim RN, BSN Entered By: Gretta Cool, BSN, RN, CWS, Kim on 04/24/2021 12:59:40 [name] ([id number]) -------------------------------------------------------------------------------- Patient/Caregiver Education Details Patient Name: LAVELLE, AKEL. Date of Service: 04/24/2021 12:45 PM Medical Record Number: [id number] Patient Account Number: [id number] Date of Birth/Gender: [birth year] (86 y.o. M) Treating RN: Cornell Barman Primary Care Physician: Lujean Amel Other  Clinician: Referring Physician: Dorthy Cooler, Dibas Treating Physician/Extender: Skipper Cliche in Treatment: 0 Education Assessment Education Provided To: Patient Education Topics Provided Welcome To [facility]: Handouts: Welcome To [facility] Methods: Demonstration, Explain/Verbal Responses: State content correctly Wound Debridement: Handouts: Wound Debridement Methods: Demonstration, Explain/Verbal Responses: State content correctly Electronic Signature(s) Signed: 04/24/2021 5:31:02 PM By: Gretta Cool, BSN, RN, CWS, Kim RN, BSN Entered By: Gretta Cool, BSN, RN, CWS, Kim on 04/24/2021 14:20:09 [name] ([id number]) -------------------------------------------------------------------------------- Wound Assessment Details Patient Name: LADARRIUS, BOGDANSKI. Date of Service: 04/24/2021 12:45 PM Medical Record Number: [id number] Patient Account Number: [id number] Date of Birth/Sex: 1927/07/09 (87 y.o. M) Treating RN: Cornell Barman Primary Care Leotha Voeltz: Dorthy Cooler, Dibas Other Clinician: Referring Emmit Oriley: Dorthy Cooler, Dibas Treating Kiet Geer/Extender: Skipper Cliche in Treatment: 0 Wound Status Wound Number: 1 Primary Inflammatory Etiology: Wound Location: Right, Lateral Ankle Wound Open Wounding Event: Gradually Appeared Status: Date Acquired: 12/26/2020 Comorbid Cataracts, Anemia, Chronic Obstructive Pulmonary Disease Weeks Of Treatment: 0 History: (COPD), Sleep Apnea, Arrhythmia, Coronary Artery Clustered Wound: No Disease, Myocardial Infarction, Type II Diabetes Photos Wound Measurements Length: (cm) 1.9 Width: (cm) 0.9 Depth: (cm) 0.4 Area: (cm) 1.343 Volume: (cm) 0.537 % Reduction in Area: 0% % Reduction in Volume: 0% Undermining: Yes Starting Position (o'clock): 12 Ending Position (o'clock): 12 Maximum Distance: (cm) 0.5 Wound Description Classification: Full Thickness Without Exposed Support Structu Wound Margin: Well defined, not attached Exudate  Amount: Large Exudate Type: Serous Exudate Color: amber res Foul Odor After Cleansing: No Slough/Fibrino Yes Wound Bed Granulation Amount: None Present (0%) Exposed Structure Necrotic Amount: Large (67-100%) Fascia Exposed: No Necrotic Quality: Adherent Slough Fat Layer (Subcutaneous Tissue) Exposed: Yes Tendon Exposed: No Muscle Exposed: No Joint Exposed: No Bone Exposed: No Treatment Notes Wound #1 (Ankle) Wound Laterality: Right, Lateral Cleanser Peri-Wound Care COYE, DAWOOD ([id number])  Topical Primary Dressing Prisma 4.34 (in) Discharge Instruction: Moisten w/normal saline or sterile water; Cover wound as directed. Do not remove from wound bed. Secondary Dressing Zetuvit Plus Silicone Border Dressing 4x4 (in/in) Secured With Compression Wrap Compression Stockings Environmental education officer) Signed: 04/24/2021 5:31:02 PM By: Gretta Cool, BSN, RN, CWS, Kim RN, BSN Entered By: Gretta Cool, BSN, RN, CWS, Kim on 04/24/2021 13:34:40 [name] ([id number]) -------------------------------------------------------------------------------- Wound Assessment Details Patient Name: MOUHAMED, GLASSCO. Date of Service: 04/24/2021 12:45 PM Medical Record Number: [id number] Patient Account Number: [id number] Date of Birth/Sex: Nov 30, 1927 (87 y.o. M) Treating RN: Cornell Barman Primary Care Leidy Massar: Dorthy Cooler, Dibas Other Clinician: Referring Lamonte Hartt: Dorthy Cooler, Dibas Treating [name] Albritton/Extender: Skipper Cliche in Treatment: 0 Wound Status Wound Number: 2 Primary Trauma, Other Etiology: Wound Location: Left, Proximal, Midline Lower Leg Wound Open Wounding Event: Trauma Status: Date Acquired: 04/01/2021 Comorbid Cataracts, Anemia, Chronic Obstructive Pulmonary Disease Weeks Of Treatment: 0 History: (COPD), Sleep Apnea, Arrhythmia, Coronary Artery Clustered Wound: No Disease, Myocardial Infarction, Type II Diabetes Photos Wound Measurements Length: (cm) 2.5 Width: (cm) 2 Depth:  (cm) 0.1 Area: (cm) 3.927 Volume: (cm) 0.393 % Reduction in Area: 0% % Reduction in Volume: 0% Epithelialization: None Tunneling: No Undermining: No Wound Description Classification: Full Thickness Without Exposed Support Structu Exudate Amount: Medium Exudate Type: Serous Exudate Color: amber res Foul Odor After Cleansing: No Slough/Fibrino Yes Wound Bed Granulation Amount: None Present (0%) Exposed Structure Necrotic Amount: Large (67-100%) Fascia Exposed: No Necrotic Quality: Adherent Slough Fat Layer (Subcutaneous Tissue) Exposed: Yes Tendon Exposed: No Muscle Exposed: No Joint Exposed: No Bone Exposed: No Electronic Signature(s) Signed: 04/24/2021 5:31:02 PM By: Gretta Cool, BSN, RN, CWS, Kim RN, BSN Entered By: Gretta Cool, BSN, RN, CWS, Kim on 04/24/2021 13:35:29 [name] ([id number]) -------------------------------------------------------------------------------- Wound Assessment Details Patient Name: [name]. Date of Service: 04/24/2021 12:45 PM Medical Record Number: [id number] Patient Account Number: [id number] Date of Birth/Sex: [birth year] (87 y.o. M) Treating RN: Cornell Barman Primary Care Freeda Spivey: Dorthy Cooler, Dibas Other Clinician: Referring Andrienne Havener: Dorthy Cooler, Dibas Treating Emberlin Verner/Extender: Skipper Cliche in Treatment: 0 Wound Status Wound Number: 3 Primary Trauma, Other Etiology: Wound Location: Left, Midline, Posterior Lower Leg Wound Open Wounding Event: Skin Tear/Laceration Status: Date Acquired: 04/03/2021 Comorbid Cataracts, Anemia, Chronic Obstructive Pulmonary Disease Weeks Of Treatment: 0 History: (COPD), Sleep Apnea, Arrhythmia, Coronary Artery Clustered Wound: No Disease, Myocardial Infarction, Type II Diabetes Photos Wound Measurements Length: (cm) 2.5 Width: (cm) 0.9 Depth: (cm) 0.1 Area: (cm) 1.767 Volume: (cm) 0.177 % Reduction in Area: 0% % Reduction in Volume: 0% Epithelialization: None Tunneling: No Undermining:  No Wound Description Classification: Partial Thickness Exudate Amount: None Present Foul Odor After Cleansing: No Slough/Fibrino No Wound Bed Granulation Amount: Large (67-100%) Exposed Structure Granulation Quality: Pink Fascia Exposed: No Necrotic Amount: None Present (0%) Fat Layer (Subcutaneous Tissue) Exposed: Yes Tendon Exposed: No Muscle Exposed: No Joint Exposed: No Bone Exposed: No Electronic Signature(s) Signed: 04/24/2021 5:31:02 PM By: Gretta Cool, BSN, RN, CWS, Kim RN, BSN Entered By: Gretta Cool, BSN, RN, CWS, Kim on 04/24/2021 13:36:35 [name] ([id number]) -------------------------------------------------------------------------------- Wound Assessment Details Patient Name: [name]. Date of Service: 04/24/2021 12:45 PM Medical Record Number: [id number] Patient Account Number: [id number] Date of Birth/Sex: 17-Feb-1928 (87 y.o. M) Treating RN: Cornell Barman Primary Care Lashe Oliveira: Dorthy Cooler, Dibas Other Clinician: Referring Orilla Templeman: Dorthy Cooler, Dibas Treating Rhodes Calvert/Extender: Skipper Cliche in Treatment: 0 Wound Status Wound Number: 4 Primary Skin Tear Etiology: Wound Location: Left Gluteus Wound Open Wounding Event: Skin Tear/Laceration Status: Date Acquired: 04/10/2021 Comorbid Cataracts,  Anemia, Chronic Obstructive Pulmonary Disease Weeks Of Treatment: 0 History: (COPD), Sleep Apnea, Arrhythmia, Coronary Artery Clustered Wound: No Disease, Myocardial Infarction, Type II Diabetes Photos Wound Measurements Length: (cm) 1.2 Width: (cm) 0.2 Depth: (cm) 0.2 Area: (cm) 0.188 Volume: (cm) 0.038 % Reduction in Area: % Reduction in Volume: Tunneling: No Undermining: No Wound Description Classification: Full Thickness Without Exposed Support Structu Exudate Amount: Small Exudate Type: Serous Exudate Color: amber res Foul Odor After Cleansing: No Slough/Fibrino No Wound Bed Granulation Amount: Medium (34-66%) Exposed Structure Granulation Quality:  Red Fascia Exposed: No Necrotic Amount: Medium (34-66%) Fat Layer (Subcutaneous Tissue) Exposed: No Necrotic Quality: Adherent Slough Tendon Exposed: No Muscle Exposed: No Joint Exposed: No Bone Exposed: No Treatment Notes Wound #4 (Gluteus) Wound Laterality: Left Cleanser Peri-Wound Care Topical Primary Dressing JAYMIN, WALN ([id number]) Prisma 4.34 (in) Discharge Instruction: Moisten w/normal saline or sterile water; Cover wound as directed. Do not remove from wound bed. Secondary Dressing Zetuvit Plus Silicone Border Dressing 4x4 (in/in) Secured With Compression Wrap Compression Stockings Environmental education officer) Signed: 04/24/2021 5:31:02 PM By: Gretta Cool, BSN, RN, CWS, Kim RN, BSN Entered By: Gretta Cool, BSN, RN, CWS, Kim on 04/24/2021 13:33:37 [name] ([id number]) -------------------------------------------------------------------------------- [place] Details Patient Name: [name] Date of Service: 04/24/2021 12:45 PM Medical Record Number: [id number] Patient Account Number: [id number] Date of Birth/Sex: 1928/04/30 (87 y.o. M) Treating RN: Cornell Barman Primary Care Heru Montz: Dorthy Cooler, Dibas Other Clinician: Referring Casanova Schurman: Dorthy Cooler, Dibas Treating Sumayah Bearse/Extender: Skipper Cliche in Treatment: 0 Vital Signs Time Taken: 12:59 Temperature (F): 97.9 Height (in): 69 Pulse (bpm): 55 Weight (lbs): 198 Respiratory Rate (breaths/min): 16 Body Mass Index (BMI): 29.2 Blood Pressure (mmHg): 146/59 Reference Range: 80 - 120 mg / dl Electronic Signature(s) Signed: 04/24/2021 5:31:02 PM By: Gretta Cool, BSN, RN, CWS, Kim RN, BSN Entered By: Gretta Cool, BSN, RN, CWS, Kim on 04/24/2021 13:00:32

## 2021-04-26 ENCOUNTER — Encounter: Payer: Self-pay | Admitting: Hematology and Oncology

## 2021-05-01 MED ORDER — NOVOLOG FLEXPEN 100 UNIT/ML ~~LOC~~ SOPN: PEN_INJECTOR | SUBCUTANEOUS | 3 refills | Status: DC

## 2021-05-01 MED ORDER — LANTUS SOLOSTAR 100 UNIT/ML ~~LOC~~ SOPN: PEN_INJECTOR | SUBCUTANEOUS | 3 refills | Status: DC

## 2021-05-01 NOTE — Telephone Encounter (Signed)
Spoke with daughter. I let her know that the prescriptions were sent in to pharmacy. I also explained to her that per Dr Dwyane Dee he does not do sliding scale.

## 2021-05-01 NOTE — Telephone Encounter (Signed)
Per daughter, [name], pt admitted to a new [facility] today and they need his insulin in [employer] and Novolog, along with a sliding scale.  [place], [place] AT [employer]  Pt daughter contact [phone]

## 2021-05-08 ENCOUNTER — Encounter: Payer: HMO | Admitting: Physician Assistant

## 2021-05-08 ENCOUNTER — Other Ambulatory Visit: Payer: Self-pay

## 2021-05-08 DIAGNOSIS — E11622 Type 2 diabetes mellitus with other skin ulcer: Secondary | ICD-10-CM | POA: Diagnosis not present

## 2021-05-08 NOTE — Progress Notes (Addendum)
Clayton [name] ([id number]) Visit Report for 05/08/2021 Chief Complaint Document Details Patient Name: Clayton [name], Clayton [name]. Date of Service: 05/08/2021 2:30 PM Medical Record Number: [id number] Patient Account Number: [id number] Date of Birth/Sex: Jan 07, 1928 (87 y.o. M) Treating RN: Donnamarie Poag Primary Care Provider: Lujean Amel Other Clinician: Referring Provider: Dorthy Cooler, Dibas Treating Provider/Extender: Skipper Cliche in Treatment: 2 Information Obtained from: Patient Chief Complaint Left leg, right ankle, and left gluteal ulcerations Electronic Signature(s) Signed: 05/08/2021 2:21:07 PM By: Worthy Keeler PA-C Entered By: Worthy Keeler on 05/08/2021 14:21:07 Clayton [name] ([id number]) -------------------------------------------------------------------------------- HPI Details Patient Name: Clayton [name] Date of Service: 05/08/2021 2:30 PM Medical Record Number: [id number] Patient Account Number: [id number] Date of Birth/Sex: 1927/08/15 (87 y.o. M) Treating RN: Donnamarie Poag Primary Care Provider: Lujean Amel Other Clinician: Referring Provider: Dorthy Cooler, Dibas Treating Provider/Extender: Skipper Cliche in Treatment: 2 History of Present Illness HPI Description: 04/24/2021 upon evaluation today patient presents for initial inspection here in the clinic concerning an issue that has been going on with wounds on the right lateral ankle region, left lower leg, and left gluteal location. Unfortunately the patient does have significant anemia and had a blood transfusion in September. He has been having erythropoietin shots and [facility] [place]. Most of these wounds are actually stated to be trauma/laceration in nature. The patient does have diabetes mellitus type 2, chronic kidney disease stage IV, and iron deficiency anemia. 05/08/2021 upon evaluation today patient's left leg actually appears to be doing pretty well with just a couple dry areas at  this point there is no real need for any specific dressings at this point in my opinion. I think the biggest thing is just a compression sock and keeping this area as dry as possible and from swelling. In regard to the right leg there does appear to be some evidence of cellulitis which has been a little bit more concerned here. Is a little warm to touch not sure if this is just inflammation due to venous stasis or if there is actual infection here again there is really not a good area to culture although the good news is the patient does seem to be doing quite well with regard to the healing in this ankle region. Electronic Signature(s) Signed: 05/08/2021 3:40:02 PM By: Worthy Keeler PA-C Entered By: Worthy Keeler on 05/08/2021 15:40:02 Clayton [name] ([id number]) -------------------------------------------------------------------------------- Physical Exam Details Patient Name: Clayton [name]. Date of Service: 05/08/2021 2:30 PM Medical Record Number: [id number] Patient Account Number: [id number] Date of Birth/Sex: 09-20-1927 (87 y.o. M) Treating RN: Donnamarie Poag Primary Care Provider: Lujean Amel Other Clinician: Referring Provider: Dorthy Cooler, Dibas Treating Provider/Extender: Skipper Cliche in Treatment: 2 Constitutional Well-nourished and well-hydrated in no acute distress. Respiratory normal breathing without difficulty. Psychiatric this patient is able to make decisions and demonstrates good insight into disease process. Alert and Oriented x 3. pleasant and cooperative. Notes Upon inspection patient's wound actually showed signs of pretty good granulation epithelization in regard to the ankle ulcer I am happy in that regard although the skin in general does not appear to be doing as well I think the patient could benefit from using some triamcinolone currently. Electronic Signature(s) Signed: 05/08/2021 3:40:23 PM By: Worthy Keeler PA-C Entered By: Worthy Keeler on  05/08/2021 15:40:23 Clayton [name] ([id number]) -------------------------------------------------------------------------------- Physician Orders Details Patient Name: Clayton [name]. Date of Service: 05/08/2021 2:30 PM Medical Record Number: [id number] Patient Account Number: [id number] Date of Birth/Sex:  [birth year] (87 y.o. M) Treating RN: Donnamarie Poag Primary Care Provider: Dorthy Cooler, Dibas Other Clinician: Referring Provider: Dorthy Cooler, Dibas Treating Provider/Extender: Skipper Cliche in Treatment: 2 Verbal / Phone Orders: No Diagnosis Coding ICD-10 Coding Code Description E11.622 Type 2 diabetes mellitus with other skin ulcer S81.812A Laceration without foreign body, left lower leg, initial encounter L97.312 Non-pressure chronic ulcer of right ankle with fat layer exposed L97.822 Non-pressure chronic ulcer of other part of left lower leg with fat layer exposed L97.821 Non-pressure chronic ulcer of other part of left lower leg limited to breakdown of skin S31.821A Laceration without foreign body of left buttock, initial encounter L98.492 Non-pressure 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 N18.4 Chronic kidney disease, stage 4 (severe) 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 Follow-up Appointments o Return Appointment in 2 weeks. [place]: - Fax orders to [facility] AT [place], [place], [place] for wound care. May utilize formulary equivalent dressing for wound treatment orders unless otherwise specified. Home Health Nurse may visit PRN to address patientos wound care needs. o Scheduled days for dressing changes to be completed; exception, patient has scheduled wound care visit that day. o **Please direct any NON-WOUND related issues/requests for orders to patient's Primary Care Physician. **If current dressing causes regression in wound condition, may D/C ordered dressing  product/s and apply Normal Saline Moist Dressing daily until next [place] or Other MD appointment. **Notify [facility] of regression in wound condition at [phone]. Bathing/ Shower/ Hygiene o May shower; gently cleanse wound with antibacterial soap, rinse and pat dry prior to dressing wounds - kEEP DRESSING DRY OR CHANGE AFTER SHOWER o No tub bath. Edema Control - Lymphedema / Segmental Compressive Device / Other o Patient to wear own compression stockings. Remove compression stockings every night before going to bed and put on every morning when getting up. - Apply steroid TCA cream every morning to bilateral legs where it appears irritated or dry and red before applying compression TED hose on in am and off at hs o DO YOUR BEST to sleep in the bed at night. DO NOT sleep in your recliner. Long hours of sitting in a recliner leads to swelling of the legs and/or potential wounds on your backside. Additional Orders / Instructions o Follow Nutritious Diet and Increase Protein Intake Medications-Please add to medication list. o P.O. Antibiotics - Order and take antibiotic as prescribed for left ankle wound o Other: - Fill the TCA, Triamcinolone Cream Wound Treatment Wound #1 - Ankle Wound Laterality: Right, Lateral Cleanser: Soap and Water 3 x Per Week/30 Days Discharge Instructions: Gently cleanse wound with antibacterial soap, rinse and pat dry prior to dressing wounds Cleanser: Wound Cleanser 3 x Per Week/30 Days KATELYN, KOHLMEYER ([id number]) Discharge Instructions: Wash your hands with soap and water. Remove old dressing, discard into plastic bag and place into trash. Cleanse the wound with Wound Cleanser prior to applying a clean dressing using gauze sponges, not tissues or cotton balls. Do not scrub or use excessive force. Pat dry using gauze sponges, not tissue or cotton balls. Topical: Triamcinolone Acetonide Cream, 0.1%, 15 (g) tube 3 x Per  Week/30 Days Discharge Instructions: APPLY TO LEG Apply as directed by provider. Primary Dressing: Prisma 4.34 (in) 3 x Per Week/30 Days Discharge Instructions: APPLY TO WOUND BED-Moisten w/normal saline or sterile water; Cover wound as directed. Do not remove from wound bed. Secondary Dressing: Zetuvit Plus  Silicone Border Dressing 4x4 (in/in) 3 x Per Week/30 Days Wound #2 - Lower Leg Wound Laterality: Left, Midline, Proximal Topical: TCA cream 1 x Per Day/30 Days Discharge Instructions: apply to irritated/red/legs Wound #3 - Lower Leg Wound Laterality: Left, Midline, Posterior Cleanser: Soap and Water 1 x Per Day/30 Days Discharge Instructions: Gently cleanse wound with antibacterial soap, rinse and pat dry prior to dressing wounds Patient Medications Allergies: penicillin Notifications Medication Indication Start End triamcinolone acetonide 05/08/2021 DOSE topical 0.1 % cream - cream topical Applied in a thin film to the legs bilaterally where there is red, dry trauma and cracked skin x30 days Bactrim DS 05/08/2021 DOSE 1 - oral 800 mg-160 mg tablet - 1 tablet oral taken 2 times per day for 14 days Electronic Signature(s) Signed: 05/08/2021 4:52:33 PM By: Donnamarie Poag Signed: 05/08/2021 5:26:28 PM By: Worthy Keeler PA-C Previous Signature: 05/08/2021 3:43:49 PM Version By: Worthy Keeler PA-C Entered By: Donnamarie Poag on 05/08/2021 16:51:24 Clayton [name] ([id number]) -------------------------------------------------------------------------------- Prescription 05/08/2021 Patient Name: Clayton [name] Provider: Jeri Cos PA-C Date of Birth: 1928-05-02 NPI#: [id number] Sex: M DEA#: [id number] Phone #: [phone] License #: Patient Address: [place], [place] [address] [address] [place], [place]  [address] [phone] Allergies penicillin Medication Medication: Route: Strength: Form: triamcinolone acetonide 0.1 % topical topical 0.1 % cream cream Class: TOPICAL ANTI-INFLAMMATORY STEROIDAL Dose: Frequency / Time: Indication: cream topical Applied in a thin film to the legs bilaterally where there is red, dry trauma and cracked skin x30 days Number of Refills: Number of Units: 2 Four Hundred Fifty Four (454) Generic Substitution: Start Date: End Date: One Time Use: Substitution Permitted 27/11/2374 No Note to Pharmacy: Hand Signature: Date(s): CHRISHAWN, BOLEY ([id number]) Prescription 05/08/2021 Patient Name: Clayton [name] Provider: Jeri Cos PA-C Date of Birth: [birth year] NPI#: [id number] Sex: Jerilynn Mages DEA#: [id number] Phone #: [phone] License #: Patient Address: [place], [place] [address] [address] [place], [place] [address] [phone] Allergies penicillin Medication Medication: Route: Strength: Form: Bactrim DS 800 mg-160 mg tablet oral 800 mg-160 mg tablet Class: ABSORBABLE SULFONAMIDE ANTIBACTERIAL AGENTS Dose: Frequency / Time: Indication: 1 1 tablet oral taken 2 times per day for 14 days Number of Refills: Number of Units: 0 Twenty Eight (28) Generic Substitution: Start Date: End Date: One Time Use: Substitution Permitted 25/85/2778 No Note to Pharmacy: Hand Signature: Date(s): Electronic Signature(s) Signed: 05/08/2021 4:52:33 PM By: Donnamarie Poag Signed: 05/08/2021 5:26:28 PM By: Worthy Keeler PA-C Entered By: Donnamarie Poag on 05/08/2021 16:51:24 Clayton [name] ([id number]) --------------------------------------------------------------------------------  Problem List Details Patient Name: Clayton [name]. Date of Service: 05/08/2021 2:30 PM Medical Record Number: [id number] Patient Account Number: [id number] Date of  Birth/Sex: [birth year] (87 y.o. M) Treating RN: Donnamarie Poag Primary Care Provider: Lujean Amel Other Clinician: Referring Provider: Dorthy Cooler, Dibas Treating Provider/Extender: Skipper Cliche in Treatment: 2 Active Problems ICD-10 Encounter Code Description Active Date MDM Diagnosis E11.622 Type 2 diabetes mellitus with other skin ulcer 04/24/2021 No Yes S81.812A Laceration without foreign body, left lower leg, initial encounter 04/24/2021 No Yes L97.312 Non-pressure chronic ulcer of right ankle with fat layer exposed 04/24/2021 No Yes L97.822 Non-pressure chronic ulcer of other part of left lower leg with fat layer 04/24/2021 No Yes exposed L97.821 Non-pressure chronic ulcer of other part of left lower leg limited to 04/24/2021 No Yes  breakdown of skin S31.821A Laceration without foreign body of left buttock, initial encounter 04/24/2021 No Yes L98.492 Non-pressure 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 04/24/2021 No Yes N18.4 Chronic kidney disease, stage 4 (severe) 04/24/2021 No Yes 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 04/24/2021 No Yes Inactive Problems Resolved Problems Electronic Signature(s) Signed: 05/08/2021 2:20:54 PM By: Worthy Keeler PA-C Entered By: Worthy Keeler on 05/08/2021 14:20:54 Clayton [name] ([id number]) -------------------------------------------------------------------------------- Progress Note Details Patient Name: Clayton [name]. Date of Service: 05/08/2021 2:30 PM Medical Record Number: [id number] Patient Account Number: [id number] Date of Birth/Sex: July 10, 1927 (87 y.o. M) Treating RN: Donnamarie Poag Primary Care Provider: Lujean Amel Other Clinician: Referring Provider: Dorthy Cooler, Dibas Treating Provider/Extender: Skipper Cliche in Treatment: 2 Subjective Chief Complaint Information obtained from Patient Left leg, right ankle, and left gluteal ulcerations History of Present Illness (HPI) 04/24/2021 upon evaluation today  patient presents for initial inspection here in the clinic concerning an issue that has been going on with wounds on the right lateral ankle region, left lower leg, and left gluteal location. Unfortunately the patient does have significant anemia and had a blood transfusion in September. He has been having erythropoietin shots and lives [facility] in [place]. Most of these wounds are actually stated to be trauma/laceration in nature. The patient does have diabetes mellitus type 2, chronic kidney disease stage IV, and iron deficiency anemia. 05/08/2021 upon evaluation today patient's left leg actually appears to be doing pretty well with just a couple dry areas at this point there is no real need for any specific dressings at this point in my opinion. I think the biggest thing is just a compression sock and keeping this area as dry as possible and from swelling. In regard to the right leg there does appear to be some evidence of cellulitis which has been a little bit more concerned here. Is a little warm to touch not sure if this is just inflammation due to venous stasis or if there is actual infection here again there is really not a good area to culture although the good news is the patient does seem to be doing quite well with regard to the healing in this ankle region. Objective Constitutional Well-nourished and well-hydrated in no acute distress. Vitals Time Taken: 2:55 PM, Height: 69 in, Weight: 198 lbs, BMI: 29.2, Temperature: 98.1 F, Pulse: 85 bpm, Respiratory Rate: 16 breaths/min, Blood Pressure: 158/71 mmHg. Respiratory normal breathing without difficulty. Psychiatric this patient is able to make decisions and demonstrates good insight into disease process. Alert and Oriented x 3. pleasant and cooperative. General Notes: Upon inspection patient's wound actually showed signs of pretty good granulation epithelization in regard to the ankle ulcer I am happy in that regard  although the skin in general does not appear to be doing as well I think the patient could benefit from using some triamcinolone currently. Integumentary (Hair, Skin) Wound #1 status is Open. Original cause of wound was Gradually Appeared. The date acquired was: 12/26/2020. The wound has been in treatment 2 weeks. The wound is located on the Right,Lateral Ankle. The wound measures 1.8cm length x 0.8cm width x 0.3cm depth; 1.131cm^2 area and 0.339cm^3 volume. There is Fat Layer (Subcutaneous Tissue) exposed. There is no tunneling or undermining noted. There is a large amount of serous drainage noted. The wound margin is well defined and not attached to the wound base. There is small (1-33%) red, pink granulation within the wound bed. There is a  large (67-100%) amount of necrotic tissue within the wound bed including Adherent Slough. Wound #2 status is Open. Original cause of wound was Skin Tear/Laceration. The date acquired was: 04/01/2021. The wound has been in treatment 2 weeks. The wound is located on the Left,Proximal,Midline Lower Leg. The wound measures 1.3cm length x 0.8cm width x 0.1cm depth; 0.817cm^2 area and 0.082cm^3 volume. There is Fat Layer (Subcutaneous Tissue) exposed. There is no tunneling or undermining noted. There is a medium amount of serous drainage noted. There is no granulation within the wound bed. There is a large (67-100%) amount of necrotic tissue within the wound bed including Eschar. Wound #3 status is Open. Original cause of wound was Skin Tear/Laceration. The date acquired was: 04/03/2021. The wound has been in treatment 2 weeks. The wound is located on the Left,Midline,Posterior Lower Leg. The wound measures 0.5cm length x 0.4cm width x 0.1cm depth; 0.157cm^2 area and 0.016cm^3 volume. There is Fat Layer (Subcutaneous Tissue) exposed. There is no tunneling or undermining noted. There is a none present amount of drainage noted. There is no granulation within the wound  bed. There is a large (67-100%) amount of necrotic HARM, JOU. ([id number]) tissue within the wound bed including Eschar. Wound #4 status is Healed - Epithelialized. Original cause of wound was Skin Tear/Laceration. The date acquired was: 04/10/2021. The wound has been in treatment 2 weeks. The wound is located on the Left Gluteus. The wound measures 0cm length x 0cm width x 0cm depth; 0cm^2 area and 0cm^3 volume. The wound is limited to skin breakdown. There is no tunneling or undermining noted. There is a none present amount of drainage noted. There is large (67-100%) red, pink granulation within the wound bed. There is no necrotic tissue within the wound bed. Other Condition(s) Patient presents with Other Dermatologic Condition located on the Bilateral Gluteus. Assessment Active Problems ICD-10 Type 2 diabetes mellitus with other skin ulcer Laceration without foreign body, left lower leg, initial encounter Non-pressure chronic ulcer of right ankle with fat layer exposed Non-pressure chronic ulcer of other part of left lower leg with fat layer exposed Non-pressure chronic ulcer of other part of left lower leg limited to breakdown of skin Laceration without foreign body of left buttock, initial encounter Non-pressure 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 Chronic kidney disease, stage 4 (severe) Other iron deficiency anemias Plan Follow-up Appointments: Return Appointment in 2 weeks. Home Health: [place]: - Fax orders to [facility] AT [place], [place], [facility] [place] for wound care. May utilize formulary equivalent dressing for wound treatment orders unless otherwise specified. Home Health Nurse may visit PRN to address patient s wound care needs. Scheduled days for dressing changes to be completed; exception, patient has scheduled wound care visit that day. **Please direct any NON-WOUND related  issues/requests for orders to patient's Primary Care Physician. **If current dressing causes regression in wound condition, may D/C ordered dressing product/s and apply Normal Saline Moist Dressing daily until next [place] or Other MD appointment. **Notify [facility]ound [facility]aling Center of regression in wound condition at [phone]. Bathing/ Shower/ Hygiene: May shower; gently cleanse wound with antibacterial soap, rinse and pat dry prior to dressing wounds - kEEP DRESSING DRY OR CHANGE AFTER SHOWER No tub bath. Edema Control - Lymphedema / Segmental Compressive Device / Other: Patient to wear own compression stockings. Remove compression stockings every night before going to bed and put on every morning when getting up. -  Apply steroid TCA cream every morning to bilateral legs where it appears irritated or dry and red before applying compression TED hose on in am and off at hs DO YOUR BEST to sleep in the bed at night. DO NOT sleep in your recliner. Long hours of sitting in a recliner leads to swelling of the legs and/or potential wounds on your backside. Additional Orders / Instructions: Follow Nutritious Diet and Increase Protein Intake Medications-Please add to medication list.: P.O. Antibiotics - Order and take antibiotic as prescribed for left ankle wound Other: - Fill the TCA, Triamcinolone Cream The following medication(s) was prescribed: triamcinolone acetonide topical 0.1 % cream cream topical Applied in a thin film to the legs bilaterally where there is red, dry trauma and cracked skin x30 days starting 05/08/2021 Bactrim DS oral 800 mg-160 mg tablet 1 1 tablet oral taken 2 times per day for 14 days starting 05/08/2021 WOUND #1: - Ankle Wound Laterality: Right, Lateral Cleanser: Soap and Water 3 x Per Week/30 Days Discharge Instructions: Gently cleanse wound with antibacterial soap, rinse and pat dry prior to dressing wounds Cleanser: Wound Cleanser 3 x Per Week/30  Days Discharge Instructions: Wash your hands with soap and water. Remove old dressing, discard into plastic bag and place into trash. Cleanse the wound with Wound Cleanser prior to applying a clean dressing using gauze sponges, not tissues or cotton balls. Do not scrub or use excessive force. Pat dry using gauze sponges, not tissue or cotton balls. Topical: Triamcinolone Acetonide Cream, 0.1%, 15 (g) tube 3 x Per Week/30 Days Discharge Instructions: APPLY TO LEG Apply as directed by provider. Primary Dressing: Prisma 4.34 (in) 3 x Per Week/30 Days Discharge Instructions: APPLY TO WOUND BED-Moisten w/normal saline or sterile water; Cover wound as directed. Do not remove from wound bed. Secondary Dressing: Zetuvit Plus Silicone Border Dressing 4x4 (in/in) 3 x Per Week/30 Days SHINE, MIKES ([id number]) WOUND #2: - Lower Leg Wound Laterality: Left, Midline, Proximal Topical: TCA cream 1 x Per Day/30 Days Discharge Instructions: apply to irritated/red/legs WOUND #3: - Lower Leg Wound Laterality: Left, Midline, Posterior Cleanser: Soap and Water 1 x Per Day/30 Days Discharge Instructions: Gently cleanse wound with antibacterial soap, rinse and pat dry prior to dressing wounds 1. Would recommend that we go ahead and continue with the wound care measures as before and the patient is in agreement with the plan. This includes the use of the silver collagen to the right ankle ulcer I think this is doing a good job. 2. I am also going to recommend at this point that we go ahead and see about initiating treatment with a triamcinolone cream to the right and left leg and areas where there is irritation at this point. This has previously done well and they are using clobetasol previously. With that being said I think that is a little bit strong to be using for so long for that reason I Minna cut back on that a little bit here to the triamcinolone which I think will still do quite well. 3. I am going to  start the patient on Bactrim DS for what appears to potentially be cellulitis of the lower extremity. I think that something that we need to definitely cover for here. 4. The patient will continue to wear compression stockings. We will see patient back for reevaluation in 1 week here in the clinic. If anything worsens or changes patient will contact our office for additional recommendations. Electronic Signature(s) Signed: 05/08/2021 3:44:48 PM By:  Melburn Hake, Rika Daughdrill PA-C Entered By: Worthy Keeler on 05/08/2021 15:44:46 Marcelline Mates Cloretta Ned ([id number]) -------------------------------------------------------------------------------- SuperBill Details Patient Name: Clayton [name] Date of Service: 05/08/2021 Medical Record Number: [id number] Patient Account Number: [id number] Date of Birth/Sex: December 15, 1927 (87 y.o. M) Treating RN: Donnamarie Poag Primary Care Provider: Lujean Amel Other Clinician: Referring Provider: Dorthy Cooler, [place] Treating Provider/Extender: Skipper Cliche in Treatment: 2 Diagnosis Coding ICD-10 Codes Code Description E11.622 Type 2 diabetes mellitus with other skin ulcer S81.812A Laceration without foreign body, left lower leg, initial encounter L97.312 Non-pressure chronic ulcer of right ankle with fat layer exposed L97.822 Non-pressure chronic ulcer of other part of left lower leg with fat layer exposed L97.821 Non-pressure chronic ulcer of other part of left lower leg limited to breakdown of skin S31.821A Laceration without foreign body of left buttock, initial encounter L98.492 Non-pressure 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 N18.4 Chronic kidney disease, stage 4 (severe) 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 Facility Procedures CPT4 Code: [id number] Description: 99214 - WOUND CARE VISIT-LEV 4 EST PT Modifier: Quantity: 1 Physician Procedures CPT4 Code: [id number] Description: 99214 - WC PHYS LEVEL 4 - EST PT Modifier: Quantity: 1 CPT4  Code: Description: ICD-10 Diagnosis Description E11.622 Type 2 diabetes mellitus with other skin ulcer S81.812A Laceration without foreign body, left lower leg, initial encounter 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

## 2021-05-08 NOTE — Progress Notes (Addendum)
KHAIR, CHASTEEN ([id number]) Visit Report for 05/08/2021 Arrival Information Details Patient Name: Clayton [name], Clayton [name]. Date of Service: 05/08/2021 2:30 PM Medical Record Number: [id number] Patient Account Number: [id number] Date of Birth/Sex: [birth year] (87 y.o. M) Treating RN: Donnamarie Poag Primary Care Provider: Dorthy Cooler, Dibas Other Clinician: Referring Provider: Dorthy Cooler, Dibas Treating Provider/Extender: Skipper Cliche in Treatment: 2 Visit Information History Since Last Visit Added or deleted any medications: No Patient Arrived: Clayton [name] Had a fall or experienced change in No Arrival Time: 14:55 activities of daily living that may affect Accompanied By: daughter risk of falls: Transfer Assistance: None Hospitalized since last visit: No Patient Identification Verified: Yes Has Dressing in Place as Prescribed: Yes Secondary Verification Process Yes Pain Present Now: No Completed: Patient Has Alerts: Yes Patient Alerts: Patient on Blood Thinner Type II Diabetic 95m aspirin Non-compressible >220 Lives HARMONY _0  ALF Electronic Signature(s) Signed: 05/08/2021 4:42:40 PM By: BDonnamarie PoagEntered By: BDonnamarie Poagon 05/08/2021 15:52:47 Clayton [name]([id number] -------------------------------------------------------------------------------- Clinic Level of Care Assessment Details Patient Name: Clayton [name] Date of Service: 05/08/2021 2:30 PM Medical Record Number: [id number] Account Number: [id number] of Birth/Sex: [birth year](87y.o. M) Treating RN: BDonnamarie PoagPrimary Care Provider: KDorthy Cooler Dibas Other Clinician: Referring Provider: KDorthy Cooler Dibas Treating Provider/Extender: SSkipper Clichein Treatment: 2 Clinic Level of Care Assessment Items TOOL 4 Quantity Score [] - Use when only an EandM is performed on FOLLOW-UP visit 0 ASSESSMENTS - Nursing Assessment / Reassessment [] - Reassessment of Co-morbidities (includes updates in patient  status) 0 [] - 0 Reassessment of Adherence to Treatment Plan ASSESSMENTS - Wound and Skin Assessment / Reassessment [] - Simple Wound Assessment / Reassessment - one wound 0 X- 3 5 Complex Wound Assessment / Reassessment - multiple wounds [] - 0 Dermatologic / Skin Assessment (not related to wound area) ASSESSMENTS - Focused Assessment [] - Circumferential Edema Measurements - multi extremities 0 [] - 0 Nutritional Assessment / Counseling / Intervention [] - 0 Lower Extremity Assessment (monofilament, tuning fork, pulses) [] - 0 Peripheral Arterial Disease Assessment (using hand held doppler) ASSESSMENTS - Ostomy and/or Continence Assessment and Care [] - Incontinence Assessment and Management 0 [] - 0 Ostomy Care Assessment and Management (repouching, etc.) PROCESS - Coordination of Care X - Simple Patient / Family Education for ongoing care 1 15 [] - 0 Complex (extensive) Patient / Family Education for ongoing care [] - 0 Staff obtains CProgrammer, systems Records, Test Results / Process Orders X- 1 10 Staff telephones HHA, Nursing Homes / Clarify orders / etc [] - 0 Routine Transfer to another Facility (non-emergent condition) [] - 0 Routine Hospital Admission (non-emergent condition) [] - 0 New Admissions / IBiomedical engineer/ Ordering NPWT, Apligraf, etc. [] - 0 Emergency Hospital Admission (emergent condition) X- 1 10 Simple Discharge Coordination [] - 0 Complex (extensive) Discharge Coordination PROCESS - Special Needs [] - Pediatric / Minor Patient Management 0 [] - 0 Isolation Patient Management [] - 0 Hearing / Language / Visual special needs [] - 0 Assessment of Community assistance (transportation, D/C planning, etc.) [] - 0 Additional assistance / Altered mentation [] - 0 Support Surface(s) Assessment (bed, cushion, seat, etc.) INTERVENTIONS - Wound Cleansing / Measurement RBRIDGER, PIZZI([id number] [] - 0 Simple Wound Cleansing - one wound X- 3  5 Complex Wound Cleansing - multiple wounds X- 1 5 Wound Imaging (photographs - any number of wounds) [] - 0 Wound Tracing (instead of photographs) [] - 0 Simple Wound  Measurement - one wound X- 3 5 Complex Wound Measurement - multiple wounds INTERVENTIONS - Wound Dressings X - Small Wound Dressing one or multiple wounds 3 10 [] - 0 Medium Wound Dressing one or multiple wounds [] - 0 Large Wound Dressing one or multiple wounds X- 1 5 Application of Medications - topical [] - 0 Application of Medications - injection INTERVENTIONS - Miscellaneous [] - External ear exam 0 [] - 0 Specimen Collection (cultures, biopsies, blood, body fluids, etc.) [] - 0 Specimen(s) / Culture(s) sent or taken to Lab for analysis [] - 0 Patient Transfer (multiple staff / Civil Service fast streamer / Similar devices) [] - 0 Simple Staple / Suture removal (25 or less) [] - 0 Complex Staple / Suture removal (26 or more) [] - 0 Hypo / Hyperglycemic Management (close monitor of Blood Glucose) [] - 0 Ankle / Brachial Index (ABI) - do not check if billed separately X- 1 5 Vital Signs Has the patient been seen at the hospital within the last three years: Yes Total Score: 125 Level Of Care: New/Established - Level 4 Electronic Signature(s) Signed: 05/08/2021 4:42:40 PM By: Donnamarie Poag Entered By: Donnamarie Poag on 05/08/2021 15:41:46 Clayton [name] ([id number]) -------------------------------------------------------------------------------- Encounter Discharge Information Details Patient Name: Clayton [name]. Date of Service: 05/08/2021 2:30 PM Medical Record Number: [id number] Patient Account Number: [id number] Date of Birth/Sex: 1928/02/24 (87 y.o. M) Treating RN: Donnamarie Poag Primary Care Cherlyn Syring: Dorthy Cooler, Dibas Other Clinician: Referring Nadja Lina: Dorthy Cooler, Dibas Treating Belmont Valli/Extender: Skipper Cliche in Treatment: 2 Encounter Discharge Information Items Discharge Condition: Stable Ambulatory  Status: Walker Discharge Destination: Other (Note Required) Telephoned: No Orders Sent: Yes Transportation: Private Auto Accompanied By: daughter Schedule Follow-up Appointment: Yes Clinical Summary of Care: Electronic Signature(s) Signed: 05/08/2021 4:42:40 PM By: Donnamarie Poag Entered By: Donnamarie Poag on 05/08/2021 15:52:23 Clayton [name] ([id number]) -------------------------------------------------------------------------------- Lower Extremity Assessment Details Patient Name: Clayton [name]. Date of Service: 05/08/2021 2:30 PM Medical Record Number: [id number] Patient Account Number: [id number] Date of Birth/Sex: [birth year] (87 y.o. M) Treating RN: Donnamarie Poag Primary Care Neyah Ellerman: Lujean Amel Other Clinician: Referring Tiwana Chavis: Dorthy Cooler, Dibas Treating Geovani Tootle/Extender: Jeri Cos Weeks in Treatment: 2 Edema Assessment Assessed: [Left: Yes] [Right: Yes] Edema: [Left: No] [Right: No] Electronic Signature(s) Signed: 05/08/2021 4:42:40 PM By: Donnamarie Poag Entered By: Donnamarie Poag on 05/08/2021 15:15:51 Clayton [name] ([id number]) -------------------------------------------------------------------------------- Multi Wound Chart Details Patient Name: Clayton [name]. Date of Service: 05/08/2021 2:30 PM Medical Record Number: [id number] Patient Account Number: [id number] Date of Birth/Sex: [birth year] (87 y.o. M) Treating RN: Donnamarie Poag Primary Care Yuriel Lopezmartinez: Dorthy Cooler, Dibas Other Clinician: Referring Montavious Wierzba: Dorthy Cooler, Dibas Treating Janet Humphreys/Extender: Skipper Cliche in Treatment: 2 Vital Signs Height(in): 35 Pulse(bpm): 53 Weight(lbs): 198 Blood Pressure(mmHg): 158/71 Body Mass Index(BMI): 29 Temperature(F): 98.1 Respiratory Rate(breaths/min): 16 Photos: Wound Location: Right, Lateral Ankle Left, Proximal, Midline Lower Leg Left, Midline, Posterior Lower Leg Wounding Event: Gradually Appeared Skin Tear/Laceration Skin Tear/Laceration Primary Etiology:  Inflammatory Skin Tear Skin Tear Comorbid History: Cataracts, Anemia, Chronic Cataracts, Anemia, Chronic Cataracts, Anemia, Chronic Obstructive Pulmonary Disease Obstructive Pulmonary Disease Obstructive Pulmonary Disease (COPD), Sleep Apnea, Arrhythmia, (COPD), Sleep Apnea, Arrhythmia, (COPD), Sleep Apnea, Arrhythmia, Coronary Artery Disease, Coronary Artery Disease, Coronary Artery Disease, Myocardial Infarction, Type II Myocardial Infarction, Type II Myocardial Infarction, Type II Diabetes, Osteomyelitis Diabetes, Osteomyelitis Diabetes, Osteomyelitis Date Acquired: 12/26/2020 04/01/2021 04/03/2021 Weeks of Treatment: _0 Wound Status: Open Open Open Measurements L x W x D (cm) 1.8x0.8x0.3 1.3x0.8x0.1 0.5x0.4x0.1 Area (cm) : 1.131 0.817 0.157  Volume (cm) : 0.339 0.082 0.016 % Reduction in Area: 15.80% 79.20% 91.10% % Reduction in Volume: 36.90% 79.10% 91.00% Classification: Full Thickness Without Exposed Full Thickness Without Exposed Partial Thickness Support Structures Support Structures Exudate Amount: Large Medium None Present Exudate Type: Serous Serous N/A Exudate Color: amber amber N/A Wound Margin: Well defined, not attached N/A N/A Granulation Amount: Small (1-33%) None Present (0%) None Present (0%) Granulation Quality: Red, Pink N/A N/A Necrotic Amount: Large (67-100%) Large (67-100%) Large (67-100%) Necrotic Tissue: Adherent [place] Exposed Structures: Fat Layer (Subcutaneous Tissue): Fat Layer (Subcutaneous Tissue): Fat Layer (Subcutaneous Tissue): Yes Yes Yes Fascia: No Fascia: No Fascia: No Tendon: No Tendon: No Tendon: No Muscle: No Muscle: No Muscle: No Joint: No Joint: No Joint: No Bone: No Bone: No Bone: No Epithelialization: N/A None None Wound Number: 4 N/A N/A Photos: N/A N/A ETHER, WOLTERS ([id number]) Wound Location: Left Gluteus N/A N/A Wounding Event: Skin Tear/Laceration N/A N/A Primary Etiology: Skin Tear N/A  N/A Comorbid History: Cataracts, Anemia, Chronic N/A N/A Obstructive Pulmonary Disease (COPD), Sleep Apnea, Arrhythmia, Coronary Artery Disease, Myocardial Infarction, Type II Diabetes, Osteomyelitis Date Acquired: 04/10/2021 N/A N/A Weeks of Treatment: 2 N/A N/A Wound Status: Open N/A N/A Measurements L x W x D (cm) 0.1x0.1x0.1 N/A N/A Area (cm) : 0.008 N/A N/A Volume (cm) : 0.001 N/A N/A % Reduction in Area: 95.70% N/A N/A % Reduction in Volume: 97.40% N/A N/A Classification: Full Thickness Without Exposed N/A N/A Support Structures Exudate Amount: None Present N/A N/A Exudate Type: N/A N/A N/A Exudate Color: N/A N/A N/A Wound Margin: N/A N/A N/A Granulation Amount: Large (67-100%) N/A N/A Granulation Quality: Red, Pink N/A N/A Necrotic Amount: None Present (0%) N/A N/A Necrotic Tissue: N/A N/A N/A Exposed Structures: Fascia: No N/A N/A Fat Layer (Subcutaneous Tissue): No Tendon: No Muscle: No Joint: No Bone: No Limited to Skin Breakdown Epithelialization: N/A N/A N/A Treatment Notes Electronic Signature(s) Signed: 05/08/2021 4:42:40 PM By: Donnamarie Poag Entered By: Donnamarie Poag on 05/08/2021 15:16:36 Clayton [name] ([id number]) -------------------------------------------------------------------------------- Multi-Disciplinary Care Plan Details Patient Name: Clayton [name]. Date of Service: 05/08/2021 2:30 PM Medical Record Number: [id number] Patient Account Number: [id number] Date of Birth/Sex: [birth year] (87 y.o. M) Treating RN: Donnamarie Poag Primary Care Provider: Lujean Amel Other Clinician: Referring Provider: Dorthy Cooler, Dibas Treating Provider/Extender: Skipper Cliche in Treatment: 2 Active Inactive Necrotic Tissue Nursing Diagnoses: Impaired tissue integrity related to necrotic/devitalized tissue Knowledge deficit related to management of necrotic/devitalized tissue Goals: Necrotic/devitalized tissue will be minimized in the wound bed Date  Initiated: 04/24/2021 Target Resolution Date: 05/24/2021 Goal Status: Active Patient/caregiver will verbalize understanding of reason and process for debridement of necrotic tissue Date Initiated: 04/24/2021 Date Inactivated: 05/08/2021 Target Resolution Date: 05/24/2021 Goal Status: Met Interventions: Assess patient pain level pre-, during and post procedure and prior to discharge Provide education on necrotic tissue and debridement process Treatment Activities: Apply topical anesthetic as ordered : 04/24/2021 Excisional debridement : 04/24/2021 Notes: Soft Tissue Infection Nursing Diagnoses: Impaired tissue integrity Knowledge deficit related to disease process and management Knowledge deficit related to home infection control: handwashing, handling of soiled dressings, supply storage Potential for infection: soft tissue Goals: Signs and symptoms of infection will be recognized early to allow for prompt treatment Date Initiated: 04/24/2021 Target Resolution Date: 05/25/2021 Goal Status: Active Interventions: Assess signs and symptoms of infection every visit Notes: Wound/Skin Impairment Nursing Diagnoses: Impaired tissue integrity Goals: Patient/caregiver will verbalize understanding of skin care regimen Date Initiated: 04/24/2021 Date Inactivated: 05/08/2021 Target  Resolution Date: 04/24/2021 Goal Status: Met Ulcer/skin breakdown will have a volume reduction of 30% by week 4 Date Initiated: 04/24/2021 Target Resolution Date: 05/24/2021 Goal Status: Active KINSEY, COWSERT ([id number]) Interventions: Assess ulceration(s) every visit Treatment Activities: Skin care regimen initiated : 04/24/2021 Topical wound management initiated : 04/24/2021 Notes: Electronic Signature(s) Signed: 05/08/2021 4:42:40 PM By: Donnamarie Poag Entered By: Donnamarie Poag on 05/08/2021 15:16:23 Clayton [name]  ([id number]) -------------------------------------------------------------------------------- Non-Wound Condition Assessment Details Patient Name: Clayton [name]. Date of Service: 05/08/2021 2:30 PM Medical Record Number: [id number] Patient Account Number: [id number] Date of Birth/Sex: 1928-05-11 (87 y.o. M) Treating RN: Donnamarie Poag Primary Care Provider: Lujean Amel Other Clinician: Referring Provider: Dorthy Cooler, Dibas Treating Provider/Extender: Skipper Cliche in Treatment: 2 Non-Wound Condition: Condition: Other Dermatologic Condition Location: Gluteus Side: Bilateral Photos Electronic Signature(s) Signed: 05/08/2021 4:42:40 PM By: Donnamarie Poag Entered By: Donnamarie Poag on 05/08/2021 15:15:33 Clayton [name] ([id number]) -------------------------------------------------------------------------------- Pain Assessment Details Patient Name: Clayton [name]. Date of Service: 05/08/2021 2:30 PM Medical Record Number: [id number] Patient Account Number: [id number] Date of Birth/Sex: [birth year] (87 y.o. M) Treating RN: Donnamarie Poag Primary Care Provider: Lujean Amel Other Clinician: Referring Provider: Dorthy Cooler, Dibas Treating Provider/Extender: Skipper Cliche in Treatment: 2 Active Problems Location of Pain Severity and Description of Pain Patient Has Paino No Site Locations Rate the pain. Current Pain Level: 0 Pain Management and Medication Current Pain Management: Electronic Signature(s) Signed: 05/08/2021 4:42:40 PM By: Donnamarie Poag Entered By: Donnamarie Poag on 05/08/2021 14:59:15 Clayton [name] ([id number]) -------------------------------------------------------------------------------- Patient/Caregiver Education Details Patient Name: Clayton [name] Date of Service: 05/08/2021 2:30 PM Medical Record Number: [id number] Patient Account Number: [id number] Date of Birth/Gender: 1928/06/02 (87 y.o. M) Treating RN: Donnamarie Poag Primary Care Physician: Dorthy Cooler, Dibas  Other Clinician: Referring Physician: Dorthy Cooler, Dibas Treating Physician/Extender: Skipper Cliche in Treatment: 2 Education Assessment Education Provided To: Patient and Caregiver Education Topics Provided Basic Hygiene: Wound Debridement: Wound/Skin Impairment: Electronic Signature(s) Signed: 05/08/2021 4:42:40 PM By: Donnamarie Poag Entered By: Donnamarie Poag on 05/08/2021 15:42:05 Clayton [name] ([id number]) -------------------------------------------------------------------------------- Wound Assessment Details Patient Name: Clayton [name]. Date of Service: 05/08/2021 2:30 PM Medical Record Number: [id number] Patient Account Number: [id number] Date of Birth/Sex: 22-Jul-1927 (87 y.o. M) Treating RN: Donnamarie Poag Primary Care Provider: Dorthy Cooler, Dibas Other Clinician: Referring Provider: Dorthy Cooler, Dibas Treating Provider/Extender: Skipper Cliche in Treatment: 2 Wound Status Wound Number: 1 Primary Inflammatory Etiology: Wound Location: Right, Lateral Ankle Wound Open Wounding Event: Gradually Appeared Status: Date Acquired: 12/26/2020 Comorbid Cataracts, Anemia, Chronic Obstructive Pulmonary Disease Weeks Of Treatment: 2 History: (COPD), Sleep Apnea, Arrhythmia, Coronary Artery Clustered Wound: No Disease, Myocardial Infarction, Type II Diabetes, Osteomyelitis Photos Wound Measurements Length: (cm) 1.8 Width: (cm) 0.8 Depth: (cm) 0.3 Area: (cm) 1.131 Volume: (cm) 0.339 % Reduction in Area: 15.8% % Reduction in Volume: 36.9% Tunneling: No Undermining: No Wound Description Classification: Full Thickness Without Exposed Support Structu Wound Margin: Well defined, not attached Exudate Amount: Large Exudate Type: Serous Exudate Color: amber res Foul Odor After Cleansing: No Slough/Fibrino Yes Wound Bed Granulation Amount: Small (1-33%) Exposed Structure Granulation Quality: Red, Pink Fascia Exposed: No Necrotic Amount: Large (67-100%) Fat Layer (Subcutaneous  Tissue) Exposed: Yes Necrotic Quality: Adherent Slough Tendon Exposed: No Muscle Exposed: No Joint Exposed: No Bone Exposed: No Treatment Notes Wound #1 (Ankle) Wound Laterality: Right, Lateral Cleanser Soap and Water Discharge Instruction: Gently cleanse wound with antibacterial soap, rinse and pat dry prior to dressing wounds ROXIE, GUEYE ([id number]) Wound Cleanser Discharge Instruction: [facility]  your hands with soap and water. Remove old dressing, discard into plastic bag and place into trash. Cleanse the wound with Wound Cleanser prior to applying a clean dressing using gauze sponges, not tissues or cotton balls. Do not scrub or use excessive force. Pat dry using gauze sponges, not tissue or cotton balls. Peri-Wound Care Topical Triamcinolone Acetonide Cream, 0.1%, 15 (g) tube Discharge Instruction: APPLY TO LEG Apply as directed by provider. Primary Dressing Prisma 4.34 (in) Discharge Instruction: APPLY TO WOUND BED-Moisten w/normal saline or sterile water; Cover wound as directed. Do not remove from wound bed. Secondary Dressing Zetuvit Plus Silicone Border Dressing 4x4 (in/in) Secured With Compression Wrap Compression Stockings Add-Ons Notes FILL SCRIPTS FOR PO ANTIBIOTICS AND STEROID CREAM Electronic Signature(s) Signed: 05/08/2021 4:42:40 PM By: Donnamarie Poag Entered By: Donnamarie Poag on 05/08/2021 15:12:06 Clayton [name] ([id number]) -------------------------------------------------------------------------------- Wound Assessment Details Patient Name: Clayton [name]. Date of Service: 05/08/2021 2:30 PM Medical Record Number: [id number] Patient Account Number: [id number] Date of Birth/Sex: 11-06-1927 (87 y.o. M) Treating RN: Donnamarie Poag Primary Care Provider: Dorthy Cooler, Dibas Other Clinician: Referring Provider: Dorthy Cooler, Dibas Treating Provider/Extender: Skipper Cliche in Treatment: 2 Wound Status Wound Number: 2 Primary Skin Tear Etiology: Wound Location:  Left, Proximal, Midline Lower Leg Wound Open Wounding Event: Skin Tear/Laceration Status: Date Acquired: 04/01/2021 Comorbid Cataracts, Anemia, Chronic Obstructive Pulmonary Disease Weeks Of Treatment: 2 History: (COPD), Sleep Apnea, Arrhythmia, Coronary Artery Clustered Wound: No Disease, Myocardial Infarction, Type II Diabetes, Osteomyelitis Photos Wound Measurements Length: (cm) 1.3 Width: (cm) 0.8 Depth: (cm) 0.1 Area: (cm) 0.817 Volume: (cm) 0.082 % Reduction in Area: 79.2% % Reduction in Volume: 79.1% Epithelialization: None Tunneling: No Undermining: No Wound Description Classification: Full Thickness Without Exposed Support Structu Exudate Amount: Medium Exudate Type: Serous Exudate Color: amber res Foul Odor After Cleansing: No Slough/Fibrino Yes Wound Bed Granulation Amount: None Present (0%) Exposed Structure Necrotic Amount: Large (67-100%) Fascia Exposed: No Necrotic Quality: Eschar Fat Layer (Subcutaneous Tissue) Exposed: Yes Tendon Exposed: No Muscle Exposed: No Joint Exposed: No Bone Exposed: No Treatment Notes Wound #2 (Lower Leg) Wound Laterality: Left, Midline, Proximal Cleanser Peri-Wound Care Topical BRODEN, HOLT ([id number]) TCA cream Discharge Instruction: apply to irritated/red/legs Primary Dressing Secondary Dressing Secured With Compression Wrap Compression Stockings Add-Ons Notes FILL SCRIPTS FOR PO ANTIBIOTICS AND STEROID CREAM Electronic Signature(s) Signed: 05/08/2021 4:42:40 PM By: Donnamarie Poag Entered By: Donnamarie Poag on 05/08/2021 15:13:06 Clayton [name] ([id number]) -------------------------------------------------------------------------------- Wound Assessment Details Patient Name: Clayton [name]. Date of Service: 05/08/2021 2:30 PM Medical Record Number: [id number] Patient Account Number: [id number] Date of Birth/Sex: [birth year] (87 y.o. M) Treating RN: Donnamarie Poag Primary Care Provider: Dorthy Cooler, Dibas Other  Clinician: Referring Provider: Dorthy Cooler, Dibas Treating Provider/Extender: Skipper Cliche in Treatment: 2 Wound Status Wound Number: 3 Primary Skin Tear Etiology: Wound Location: Left, Midline, Posterior Lower Leg Wound Open Wounding Event: Skin Tear/Laceration Status: Date Acquired: 04/03/2021 Comorbid Cataracts, Anemia, Chronic Obstructive Pulmonary Disease Weeks Of Treatment: 2 History: (COPD), Sleep Apnea, Arrhythmia, Coronary Artery Clustered Wound: No Disease, Myocardial Infarction, Type II Diabetes, Osteomyelitis Photos Wound Measurements Length: (cm) 0.5 Width: (cm) 0.4 Depth: (cm) 0.1 Area: (cm) 0.157 Volume: (cm) 0.016 % Reduction in Area: 91.1% % Reduction in Volume: 91% Epithelialization: None Tunneling: No Undermining: No Wound Description Classification: Partial Thickness Exudate Amount: None Present Foul Odor After Cleansing: No Slough/Fibrino No Wound Bed Granulation Amount: None Present (0%) Exposed Structure Necrotic Amount: Large (67-100%) Fascia Exposed: No Necrotic Quality: Eschar Fat Layer (Subcutaneous Tissue) Exposed: Yes Tendon  Exposed: No Muscle Exposed: No Joint Exposed: No Bone Exposed: No Treatment Notes Wound #3 (Lower Leg) Wound Laterality: Left, Midline, Posterior Cleanser Soap and Water Discharge Instruction: Gently cleanse wound with antibacterial soap, rinse and pat dry prior to dressing wounds Peri-Wound Care KINLEY, FERRENTINO ([id number]) Topical Primary Dressing Secondary Dressing Secured With Compression Wrap Compression Stockings Add-Ons Notes FILL SCRIPTS FOR PO ANTIBIOTICS AND STEROID CREAM Electronic Signature(s) Signed: 05/08/2021 4:42:40 PM By: Donnamarie Poag Entered By: Donnamarie Poag on 05/08/2021 15:13:52 Clayton [name] ([id number]) -------------------------------------------------------------------------------- Wound Assessment Details Patient Name: Clayton [name]. Date of Service: 05/08/2021 2:30  PM Medical Record Number: [id number] Patient Account Number: [id number] Date of Birth/Sex: September 27, 1927 (87 y.o. M) Treating RN: Donnamarie Poag Primary Care Luverne Zerkle: Dorthy Cooler, Dibas Other Clinician: Referring Dabney Dever: Dorthy Cooler, Dibas Treating Bayleigh Loflin/Extender: Skipper Cliche in Treatment: 2 Wound Status Wound Number: 4 Primary Skin Tear Etiology: Wound Location: Left Gluteus Wound Healed - Epithelialized Wounding Event: Skin Tear/Laceration Status: Date Acquired: 04/10/2021 Comorbid Cataracts, Anemia, Chronic Obstructive Pulmonary Disease Weeks Of Treatment: 2 History: (COPD), Sleep Apnea, Arrhythmia, Coronary Artery Clustered Wound: No Disease, Myocardial Infarction, Type II Diabetes, Osteomyelitis Photos Wound Measurements Length: (cm) 0 Width: (cm) 0 Depth: (cm) 0 Area: (cm) 0 Volume: (cm) 0 % Reduction in Area: 100% % Reduction in Volume: 100% Tunneling: No Undermining: No Wound Description Classification: Full Thickness Without Exposed Support Structure Exudate Amount: None Present s Foul Odor After Cleansing: No Slough/Fibrino No Wound Bed Granulation Amount: Large (67-100%) Exposed Structure Granulation Quality: Red, Pink Fascia Exposed: No Necrotic Amount: None Present (0%) Fat Layer (Subcutaneous Tissue) Exposed: No Tendon Exposed: No Muscle Exposed: No Joint Exposed: No Bone Exposed: No Limited to Skin Breakdown Treatment Notes Wound #4 (Gluteus) Wound Laterality: Left Cleanser Peri-Wound Care Topical ANASTASIOS, MELANDER ([id number]) Primary Dressing Secondary Dressing Secured With Compression Wrap Compression Stockings Add-Ons Notes FILL SCRIPTS FOR PO ANTIBIOTICS AND STEROID CREAM Electronic Signature(s) Signed: 05/08/2021 4:42:40 PM By: Donnamarie Poag Entered By: Donnamarie Poag on 05/08/2021 15:39:12 Clayton [name] ([id number]) -------------------------------------------------------------------------------- Vitals Details Patient Name: Clayton [name]. Date of Service: 05/08/2021 2:30 PM Medical Record Number: [id number] Patient Account Number: [id number] Date of Birth/Sex: 10-29-1927 (87 y.o. M) Treating RN: Donnamarie Poag Primary Care Kriss Ishler: Dorthy Cooler, [place] Other Clinician: Referring Niyati Heinke: Dorthy Cooler, Dibas Treating Araeya Lamb/Extender: Skipper Cliche in Treatment: 2 Vital Signs Time Taken: 14:55 Temperature (F): 98.1 Height (in): 69 Pulse (bpm): 85 Weight (lbs): 198 Respiratory Rate (breaths/min): 16 Body Mass Index (BMI): 29.2 Blood Pressure (mmHg): 158/71 Reference Range: 80 - 120 mg / dl Electronic Signature(s) Signed: 05/08/2021 4:42:40 PM By: Donnamarie Poag Entered ByDonnamarie Poag on 05/08/2021 14:59:04

## 2021-05-13 ENCOUNTER — Encounter: Payer: Self-pay | Admitting: Cardiology

## 2021-05-13 DIAGNOSIS — I4819 Other persistent atrial fibrillation: Secondary | ICD-10-CM | POA: Insufficient documentation

## 2021-05-13 NOTE — Assessment & Plan Note (Signed)
On aspirin.  No longer on Thienopyridine.  Antiplatelet agent due to longstanding anemia

## 2021-05-13 NOTE — Assessment & Plan Note (Signed)
Grade 2 diastolic function with significant elevated PA pressures.  Probably multifactorial.  This could probably explains of his edema  He is on STEMI dose of Lasix.  Continue to recommend support stockings.  He did not tolerate TED hose, but sports legs may be better.

## 2021-05-13 NOTE — Assessment & Plan Note (Signed)
Multivessel CAD with severe circumflex disease  Chronic medical therapy.  He does have some intermittent twinges of chest comfort, but no active anginal symptoms.  Evaluation at this point especially based on conversation that the daughter had with hospitalist. Plan: Continue Imdur and aspirin along with statin.

## 2021-05-13 NOTE — Assessment & Plan Note (Signed)
Baseline creatinine around 3.  Being followed by nephrology.  Not really a dialysis candidate.  Do suspect that there is some component of his chronic anemia that is related to his insufficiency.

## 2021-05-13 NOTE — Assessment & Plan Note (Addendum)
This is the first time of actually seen that he has been in atrial fibrillation.  Rate is pretty well controlled on no medications.  No requirement for rate or rhythm control agent.  Would avoid anticoagulation despite advanced risks due to his anemia of chronic disease and probably likely slow GI bleed.  This patients CHA2DS2-VASc Score and unadjusted Ischemic Stroke Rate (% per year) is equal to 11.2 % stroke rate/year from a score of 7  Above score calculated as 1 point each if present [CHF, HTN, DM, Vascular=MI/PAD/Aortic Plaque, Age if 65-74, or Male] Above score calculated as 2 points each if present [Age > 75, or Stroke/TIA/TE]

## 2021-05-13 NOTE — Assessment & Plan Note (Signed)
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  Not really noticing any flip-flops or skipping beats or prolonged.  She has had some irregular heartbeats.  He is currently in A. fib but relatively asymptomatic.

## 2021-05-13 NOTE — Assessment & Plan Note (Addendum)
He is on atorvastatin.  Tolerating well.  Most recent labs showed LDL 42.  Well within goal.

## 2021-05-13 NOTE — Assessment & Plan Note (Signed)
Blood pressure is little high today, but would prefer permissive hypertension as opposed to aggressive antihypertensive management.Marland Kitchen  He is only on Imdur and furosemide but no other antihypertensives

## 2021-05-13 NOTE — Assessment & Plan Note (Signed)
Occasionally getting Feraheme infusions along with blood transfusions.  I am very leery of putting this elderly gentleman on a DOAC despite being in A. fib with an elevated CHA2DS2-VASc score.  Would like to get the opinion of his oncologist and nephrologist prior to considering invasive management.

## 2021-05-15 ENCOUNTER — Emergency Department (HOSPITAL_BASED_OUTPATIENT_CLINIC_OR_DEPARTMENT_OTHER): Payer: HMO

## 2021-05-15 ENCOUNTER — Other Ambulatory Visit: Payer: Self-pay

## 2021-05-15 ENCOUNTER — Emergency Department (HOSPITAL_BASED_OUTPATIENT_CLINIC_OR_DEPARTMENT_OTHER)
Admission: EM | Admit: 2021-05-15 | Discharge: 2021-05-15 | Disposition: A | Discharge status: Home / Self Care | Payer: HMO | Attending: Emergency Medicine | Admitting: Emergency Medicine

## 2021-05-15 ENCOUNTER — Encounter (HOSPITAL_BASED_OUTPATIENT_CLINIC_OR_DEPARTMENT_OTHER): Payer: Self-pay | Admitting: *Deleted

## 2021-05-15 DIAGNOSIS — E1122 Type 2 diabetes mellitus with diabetic chronic kidney disease: Secondary | ICD-10-CM | POA: Diagnosis not present

## 2021-05-15 DIAGNOSIS — N184 Chronic kidney disease, stage 4 (severe): Secondary | ICD-10-CM | POA: Insufficient documentation

## 2021-05-15 DIAGNOSIS — D631 Anemia in chronic kidney disease: Secondary | ICD-10-CM | POA: Diagnosis not present

## 2021-05-15 DIAGNOSIS — Z794 Long term (current) use of insulin: Secondary | ICD-10-CM | POA: Insufficient documentation

## 2021-05-15 DIAGNOSIS — S81011A Laceration without foreign body, right knee, initial encounter: Secondary | ICD-10-CM | POA: Diagnosis not present

## 2021-05-15 DIAGNOSIS — Z7982 Long term (current) use of aspirin: Secondary | ICD-10-CM | POA: Diagnosis not present

## 2021-05-15 DIAGNOSIS — N179 Acute kidney failure, unspecified: Secondary | ICD-10-CM | POA: Diagnosis not present

## 2021-05-15 DIAGNOSIS — W19XXXA Unspecified fall, initial encounter: Secondary | ICD-10-CM

## 2021-05-15 DIAGNOSIS — Z87891 Personal history of nicotine dependence: Secondary | ICD-10-CM | POA: Diagnosis not present

## 2021-05-15 DIAGNOSIS — S0990XA Unspecified injury of head, initial encounter: Secondary | ICD-10-CM | POA: Diagnosis present

## 2021-05-15 DIAGNOSIS — S51011A Laceration without foreign body of right elbow, initial encounter: Secondary | ICD-10-CM | POA: Diagnosis not present

## 2021-05-15 DIAGNOSIS — I2510 Atherosclerotic heart disease of native coronary artery without angina pectoris: Secondary | ICD-10-CM | POA: Diagnosis not present

## 2021-05-15 DIAGNOSIS — J449 Chronic obstructive pulmonary disease, unspecified: Secondary | ICD-10-CM | POA: Diagnosis not present

## 2021-05-15 DIAGNOSIS — Z7951 Long term (current) use of inhaled steroids: Secondary | ICD-10-CM | POA: Diagnosis not present

## 2021-05-15 DIAGNOSIS — M546 Pain in thoracic spine: Secondary | ICD-10-CM | POA: Diagnosis not present

## 2021-05-15 DIAGNOSIS — Z79899 Other long term (current) drug therapy: Secondary | ICD-10-CM | POA: Insufficient documentation

## 2021-05-15 DIAGNOSIS — I509 Heart failure, unspecified: Secondary | ICD-10-CM | POA: Insufficient documentation

## 2021-05-15 DIAGNOSIS — I130 Hypertensive heart and chronic kidney disease with heart failure and stage 1 through stage 4 chronic kidney disease, or unspecified chronic kidney disease: Secondary | ICD-10-CM | POA: Insufficient documentation

## 2021-05-15 DIAGNOSIS — I4819 Other persistent atrial fibrillation: Secondary | ICD-10-CM | POA: Insufficient documentation

## 2021-05-15 LAB — CBC WITH DIFFERENTIAL/PLATELET
Abs Immature Granulocytes: 0.1 10*3/uL — ABNORMAL HIGH (ref 0.00–0.07)
Basophils Absolute: 0.1 10*3/uL (ref 0.0–0.1)
Basophils Relative: 1 %
Eosinophils Absolute: 0.6 10*3/uL — ABNORMAL HIGH (ref 0.0–0.5)
Eosinophils Relative: 4 %
HCT: 31.6 % — ABNORMAL LOW (ref 39.0–52.0)
Hemoglobin: 9.7 g/dL — ABNORMAL LOW (ref 13.0–17.0)
Immature Granulocytes: 1 %
Lymphocytes Relative: 6 %
Lymphs Abs: 1 10*3/uL (ref 0.7–4.0)
MCH: 25.8 pg — ABNORMAL LOW (ref 26.0–34.0)
MCHC: 30.7 g/dL (ref 30.0–36.0)
MCV: 84 fL (ref 80.0–100.0)
Monocytes Absolute: 1.9 10*3/uL — ABNORMAL HIGH (ref 0.1–1.0)
Monocytes Relative: 13 %
Neutro Abs: 11.6 10*3/uL — ABNORMAL HIGH (ref 1.7–7.7)
Neutrophils Relative %: 75 %
Platelets: 387 10*3/uL (ref 150–400)
RBC: 3.76 MIL/uL — ABNORMAL LOW (ref 4.22–5.81)
RDW: 20.6 % — ABNORMAL HIGH (ref 11.5–15.5)
WBC: 15.3 10*3/uL — ABNORMAL HIGH (ref 4.0–10.5)
nRBC: 0 % (ref 0.0–0.2)

## 2021-05-15 LAB — COMPREHENSIVE METABOLIC PANEL
ALT: 15 U/L (ref 0–44)
AST: 22 U/L (ref 15–41)
Albumin: 3.6 g/dL (ref 3.5–5.0)
Alkaline Phosphatase: 115 U/L (ref 38–126)
Anion gap: 14 (ref 5–15)
BUN: 80 mg/dL — ABNORMAL HIGH (ref 8–23)
CO2: 21 mmol/L — ABNORMAL LOW (ref 22–32)
Calcium: 9.5 mg/dL (ref 8.9–10.3)
Chloride: 103 mmol/L (ref 98–111)
Creatinine, Ser: 3.29 mg/dL — ABNORMAL HIGH (ref 0.61–1.24)
GFR, Estimated: 17 mL/min — ABNORMAL LOW (ref >60)
Glucose, Bld: 170 mg/dL — ABNORMAL HIGH (ref 70–99)
Potassium: 4.6 mmol/L (ref 3.5–5.1)
Sodium: 138 mmol/L (ref 135–145)
Total Bilirubin: 0.4 mg/dL (ref 0.3–1.2)
Total Protein: 6.9 g/dL (ref 6.5–8.1)

## 2021-05-15 LAB — URINALYSIS, ROUTINE W REFLEX MICROSCOPIC
Bilirubin Urine: NEGATIVE
Glucose, UA: NEGATIVE mg/dL
Ketones, ur: NEGATIVE mg/dL
Leukocytes,Ua: NEGATIVE
Nitrite: NEGATIVE
Protein, ur: 100 mg/dL — AB
Specific Gravity, Urine: 1.011 (ref 1.005–1.030)
pH: 5.5 (ref 5.0–8.0)

## 2021-05-15 LAB — PROTIME-INR
INR: 1 (ref 0.8–1.2)
Prothrombin Time: 12.9 seconds (ref 11.4–15.2)

## 2021-05-15 LAB — MAGNESIUM: Magnesium: 2.3 mg/dL (ref 1.7–2.4)

## 2021-05-15 NOTE — Discharge Instructions (Signed)
Your history, exam, work-up today are concerning for acute kidney injury likely due to dehydration.  We had a long discussion about admission for this versus letting you go home and as the images did not show evidence of acute traumatic injuries or any fractures, we do feel is reasonable to let you go home.  Please follow-up with your primary doctor later this week and increase your hydration at home.  If any symptoms change or worsen acutely, please return to the nearest emergency department.

## 2021-05-15 NOTE — ED Triage Notes (Addendum)
Pt from independent living at [facility]. EMS states pt tripped and fell, skin tear to rt knee and elbow. No had involvement, Denies pain, No LOC. Ambulatory when EMS arrived. Denies back and neck pain.   Daughter states pt has fallen 3 times in the last week. Pt has multiple bruises from falls, they are concerned about a left hip injury although has walked and has good passive ROM.

## 2021-05-15 NOTE — ED Provider Notes (Signed)
[name] Provider Note   CSN: [id number] Arrival date & time: 05/15/21  1354     History Chief Complaint  Patient presents with   [name]    [name] is a 87 y.o. male.  The history is provided by the patient, a relative and medical records. No language interpreter was used.  Fall This is a recurrent problem. The current episode started 6 to 12 hours ago. The problem occurs every several days. The problem has not changed since onset.Pertinent negatives include no chest pain, no abdominal pain, no headaches and no shortness of breath. Nothing aggravates the symptoms. Nothing relieves the symptoms. He has tried nothing for the symptoms. The treatment provided no relief.      Past Medical History:  Diagnosis Date   Anemia    BPH (benign prostatic hyperplasia)    CAD (coronary artery disease)    hx of stemi  04-2018    Chronic renal failure    Chronic renal insufficiency    COPD (chronic obstructive pulmonary disease) (HCC)    DDD (degenerative disc disease), lumbar    Diabetes mellitus    Hyperlipidemia    Hyperparathyroidism ([place])    Hypertension    IDA (iron deficiency anemia)    Neuromuscular disorder (HCC)    OSA on CPAP    Osteomyelitis of forearm (HCC)    Osteoporosis    Wenckebach second degree AV block 11/2020   Event monitor showed [name] block intermittently as well as first-degree block.  Rare occasional PACs and PVCs.  Minimum heart rate 32 bpm at early morning hours.  Maximal heart rate 112 bpm, average 76 bpm.    Patient Active Problem List   Diagnosis Date Noted   Persistent atrial fibrillation ([place]) 05/13/2021   Anemia in chronic kidney disease 03/15/2021   DM (diabetes mellitus), type 2 with renal complications ([place]) 75/03/2584   Chronic heart failure with preserved ejection fraction (HFpEF) ([place]) 03/11/2021   Symptomatic anemia 03/11/2021   Elevated troponin 03/11/2021   Weight loss, unintentional 09/12/2020    Leukocytosis 09/12/2020   Unsteady gait when walking 12/14/2019   Rhonchi at both lung bases    AV heart block    CAD, multiple vessel    Normocytic normochromic anemia    Chronic obstructive pulmonary disease (HCC)    ST-segment elevation myocardial infarction (STEMI) of inferior wall ([place]) 04/20/2018   Symptomatic bradycardia 04/20/2018   Presence of drug-eluting stent in right coronary artery 04/18/2018   Iron deficiency 10/30/2016   Cellulitis of right hand 08/11/2016   Chronic kidney disease, stage IV (severe) ([place]) 08/07/2016   Type II or unspecified type diabetes mellitus with renal manifestations, uncontrolled(250.42) 02/03/2014   BPH (benign prostatic hyperplasia) 07/03/2013   Hyperlipidemia associated with type 2 diabetes mellitus ([place]) 04/06/2013   Dyspnea 03/04/2013   Smoking history 07/24/2012   Cough variant asthma 07/24/2012   Solitary pulmonary nodule 06/27/2012   Hypoxia 06/19/2012   Diabetes mellitus ([place]) 06/19/2012   Hard of hearing 06/19/2012   Essential hypertension 05/19/2010   OSA treated with BiPAP 05/19/2010    Past Surgical History:  Procedure Laterality Date   CORONARY STENT INTERVENTION N/A 04/20/2018   Procedure: CORONARY STENT INTERVENTION;  Surgeon: Leonie Man, MD;  Location: [facility] INVASIVE CV LAB;;;     CORONARY/GRAFT ACUTE MI REVASCULARIZATION N/A 04/20/2018   Procedure: Coronary/Graft Acute MI Revascularization;  Surgeon: Leonie Man, MD;  Location: [place] CV LAB;  Service: Cardiovascular;  Laterality: N/A; -->  after initial PTCA restoring flow down the RCA, there was diffuse proximal to mid disease treated with a DES SYNERGY 3 X 38 ( 3.79mm)   EYE SURGERY     HOLTER MONITOR  05/2018   Sinus rhythm noted with sinus bradycardia.  Also sinus rhythm with 2-1 AV block noted.  Maximum heart rate was sinus tachycardia 123 bpm.  Rare PVCs accelerated idioventricular rhythm noted.  Intermittent Wenckebach block noted.   I & D EXTREMITY Right  08/13/2016   Procedure: IRRIGATION AND DEBRIDEMENT RIGHT ELBOW AND HAND;  Surgeon: Milly Jakob, MD;  Location: [facility];  Service: Orthopedics;  Laterality: Right;   LEFT HEART CATH AND CORONARY ANGIOGRAPHY N/A 04/20/2018   Procedure: LEFT HEART CATH AND CORONARY ANGIOGRAPHY;  Surgeon: Leonie Man, MD;  Location: [place] CV LAB; mRCA 100%&80% (after 55% tapering in pRCA), mLM-ostLAD 40% w/ 85% ostCx, ost-pCx 65% @ Om1 w/ ost 85%. pLAD 60% & 50% after D1 with distal 75%, ostD1 65% & 90% after small side branch.     TRANSTHORACIC ECHOCARDIOGRAM  04/21/2018   EF 50-55%. Posterior Wall HK. Severe RV HK, dilated IVC. Temp wire in place   VASECTOMY     VIDEO BRONCHOSCOPY Bilateral 08/25/2012   Procedure: VIDEO BRONCHOSCOPY WITHOUT FLUORO;  Surgeon: Brand Males, MD;  Location: [facility];  Service: Cardiopulmonary;  Laterality: Bilateral;       Family History  Problem Relation Age of Onset   Diabetes Mellitus II Other    Hypertension Other    Other Father        complications from strep   CVA Father    Cancer Sister    Other Brother        hip replacement complications     Social History   Tobacco Use   Smoking status: Former    Packs/day: 3.00    Years: 33.00    Pack years: 99.00    Types: Cigarettes    Quit date: 06/19/1973    Years since quitting: 47.9   Smokeless tobacco: Former    Quit date: 09/26/1975  Vaping Use   Vaping Use: Never used  Substance Use Topics   Alcohol use: No   Drug use: No    Home Medications Prior to Admission medications   Medication Sig Start Date End Date Taking? Authorizing Provider  ACCU-CHEK SOFTCLIX LANCETS lancets TEST BLOOD SUGAR FOUR TIMES DAILY 07/28/18   Elayne Snare, MD  albuterol (PROVENTIL HFA;VENTOLIN HFA) 108 (90 Base) MCG/ACT inhaler Inhale 1-2 puffs into the lungs every 6 (six) hours as needed for wheezing or shortness of breath.    [provider]  Alcohol Swabs (B-D SINGLE USE SWABS REGULAR) PADS USE 7 SWABS  DAILY AS DIRECTED 07/15/20   Elayne Snare, MD  aspirin EC 81 MG tablet Take 1 tablet (81 mg total) by mouth daily. Swallow whole. 11/15/20   Leonie Man, MD  atorvastatin (LIPITOR) 40 MG tablet Take 1 tablet (40 mg total) by mouth daily. Patient taking differently: Take 40 mg by mouth. Jory Sims, Fri and Sat 11/15/20 04/21/21  Leonie Man, MD  cholecalciferol (VITAMIN D) 1000 units tablet Take 2,000 Units by mouth daily.    [provider]  Continuous Blood Gluc Receiver (FREESTYLE LIBRE 14 DAY READER) DEVI 1 each by Does not apply route every 14 (fourteen) days. Use reader to monitor blood sugar continuously with freestyle libre sensor. 08/19/20   Elayne Snare, MD  Continuous Blood Gluc Sensor (FREESTYLE LIBRE 14 DAY SENSOR)  MISC USE AS DIRECTED EVERY 14 DAYS TO MONITOR BLOOD SUGAR CONTINUOUSLY 12/26/20   Elayne Snare, MD  finasteride (PROSCAR) 5 MG tablet TAKE 1 TABLET EVERY DAY 04/23/19   Elayne Snare, MD  furosemide (LASIX) 20 MG tablet Take 1 tablet (20 mg total) by mouth 2 (two) times daily. Take seocnd dose about 12 noon each daily 12/14/19   Leonie Man, MD  glucose blood test strip Use as instructed to test blood sugars 4 times daily 06/09/18   Elayne Snare, MD  insulin aspart (NOVOLOG FLEXPEN) 100 UNIT/ML FlexPen INJECT 7 UNITS SUBCUTANEOUS EVERY MORNING, 6 UNITS AT LUNCH, AND 9 UNITS AT SUPPER 05/01/21   Elayne Snare, MD  insulin glargine (LANTUS SOLOSTAR) 100 UNIT/ML Solostar Pen Inject 0.16 mLs (16 Units total) into the skin daily. 05/01/21   Elayne Snare, MD  isosorbide mononitrate (IMDUR) 30 MG 24 hr tablet Take 1 tablet (30 mg total) by mouth daily. 03/16/21   Leonie Man, MD  Melatonin 5 MG TABS Take 2 tablets by mouth at bedtime.     [provider]  Multiple Vitamin (MULTIVITAMIN WITH MINERALS) TABS tablet Take 1 tablet by mouth daily.    [provider]  nitroGLYCERIN (NITROSTAT) 0.4 MG SL tablet Place 1 tablet (0.4 mg total) under the tongue every 5  (five) minutes as needed for chest pain. 05/21/19 04/21/21  Leonie Man, MD  omeprazole (PRILOSEC) 20 MG capsule Take 1 capsule (20 mg total) by mouth daily. 11/17/18   Doran Stabler, MD  oxybutynin (DITROPAN) 5 MG tablet Take 5 mg by mouth daily. 02/06/19   [provider]  [place] X 5/16" 0.3 ML MISC USE THREE TIMES DAILY AS NEEDED 01/16/21   Elayne Snare, MD    Allergies    Doxycycline, Levaquin [levofloxacin in d5w], Aliskiren, Lisinopril, Losartan potassium, Nitrous oxide, Penicillins, Tape, and Diltiazem  Review of Systems   Review of Systems  Constitutional:  Negative for chills, diaphoresis, fatigue and fever.  HENT:  Negative for congestion.   Eyes:  Negative for visual disturbance.  Respiratory:  Negative for cough, chest tightness, shortness of breath and wheezing.   Cardiovascular:  Negative for chest pain, palpitations and leg swelling.  Gastrointestinal:  Negative for abdominal pain, constipation, diarrhea, nausea and vomiting.  Genitourinary:  Negative for dysuria, flank pain and frequency.  Musculoskeletal:  Positive for back pain (bilateral paraspinal and rib pains in upper back). Negative for neck pain.  Skin:  Positive for color change (bruisiung all over) and wound.  Neurological:  Negative for dizziness, syncope, weakness, light-headedness, numbness and headaches.  Psychiatric/Behavioral:  Negative for agitation and confusion.   All other systems reviewed and are negative.  Physical Exam Updated Vital Signs BP (!) 151/69   Pulse 93   Temp 97.8 F (36.6 C)   Resp 15   Ht 5\' 10"  (1.778 m)   Wt 89.8 kg   SpO2 99%   BMI 28.41 kg/m   Physical Exam Vitals and nursing note reviewed.  Constitutional:      General: He is not in acute distress.    Appearance: He is well-developed. He is not ill-appearing, toxic-appearing or diaphoretic.  HENT:     Head: Normocephalic and atraumatic.     Nose: Nose normal. No congestion or  rhinorrhea.     Mouth/Throat:     Mouth: Mucous membranes are moist.     Pharynx: No oropharyngeal exudate or posterior oropharyngeal erythema.  Eyes:  Extraocular Movements: Extraocular movements intact.     Right eye: Normal extraocular motion.     Left eye: Normal extraocular motion.     Conjunctiva/sclera: Conjunctivae normal.     Pupils: Pupils are unequal.     Comments: Right pupil is slightly more dilated than left.  Cardiovascular:     Rate and Rhythm: Normal rate and regular rhythm.     Pulses: Normal pulses.     Heart sounds: No murmur heard. Pulmonary:     Effort: Pulmonary effort is normal. No respiratory distress.     Breath sounds: Normal breath sounds. No wheezing, rhonchi or rales.  Chest:     Chest wall: No tenderness.  Abdominal:     General: Abdomen is flat. There is no distension.     Palpations: Abdomen is soft.     Tenderness: There is no abdominal tenderness. There is no guarding or rebound.  Musculoskeletal:        General: Tenderness present. No swelling.     Cervical back: Neck supple. No tenderness.     Right lower leg: No edema.     Left lower leg: No edema.     Comments: Skin tears on right elbow and right knee.  Minimal tenderness over the locations.  Intact sensation, strength, and pulses in right extremities.  Patient does have some subtle weakness in left leg with leg raise compared to right.  Normal sensation throughout.  Skin:    General: Skin is warm and dry.     Capillary Refill: Capillary refill takes less than 2 seconds.     Findings: Bruising present.  Neurological:     General: No focal deficit present.     Mental Status: He is alert. Mental status is at baseline.     Cranial Nerves: No dysarthria or facial asymmetry.     Sensory: No sensory deficit.     Motor: Weakness present. No tremor or abnormal muscle tone.     Coordination: Finger-Nose-Finger Test normal.     Comments: Subtle weakness in left leg compared to right with leg  raise.  Normal sensation throughout.  Normal finger-nose-finger testing.  Symmetric grip strength.  Symmetric smile.  Clear speech.  Right pupil is larger than the left but both are reactive.  Normal extraocular meds.  Psychiatric:        Mood and Affect: Mood normal.    ED Results / Procedures / Treatments   Labs (all labs ordered are listed, but only abnormal results are displayed) Labs Reviewed  CBC WITH DIFFERENTIAL/PLATELET - Abnormal; Notable for the following components:      Result Value   WBC 15.3 (*)    RBC 3.76 (*)    Hemoglobin 9.7 (*)    HCT 31.6 (*)    MCH 25.8 (*)    RDW 20.6 (*)    Neutro Abs 11.6 (*)    Monocytes Absolute 1.9 (*)    Eosinophils Absolute 0.6 (*)    Abs Immature Granulocytes 0.10 (*)    All other components within normal limits  COMPREHENSIVE METABOLIC PANEL - Abnormal; Notable for the following components:   CO2 21 (*)    Glucose, Bld 170 (*)    BUN 80 (*)    Creatinine, Ser 3.29 (*)    GFR, Estimated 17 (*)    All other components within normal limits  URINALYSIS, ROUTINE W REFLEX MICROSCOPIC - Abnormal; Notable for the following components:   Color, Urine COLORLESS (*)    Hgb urine dipstick  TRACE (*)    Protein, ur 100 (*)    All other components within normal limits  URINE CULTURE  PROTIME-INR  MAGNESIUM  TSH    EKG EKG Interpretation  Date/Time:  Monday May 15 2021 19:51:06 EST Ventricular Rate:  93 PR Interval:    QRS Duration: 93 QT Interval:  358 QTC Calculation: 446 R Axis:   29 Text Interpretation: when compared to prior, similar afib vs prolonged PR. No STEMI Confirmed by Antony Blackbird [id number]) on 05/15/2021 9:29:03 PM  Radiology CT Head Wo Contrast  Result Date: 05/15/2021 CLINICAL DATA:  Trauma. EXAM: CT HEAD WITHOUT CONTRAST CT CERVICAL SPINE WITHOUT CONTRAST TECHNIQUE: Multidetector CT imaging of the head and cervical spine was performed following the standard protocol without intravenous contrast.  Multiplanar CT image reconstructions of the cervical spine were also generated. COMPARISON:  None. FINDINGS: CT HEAD FINDINGS Brain: Mild age-related atrophy and chronic microvascular ischemic changes. There is no acute intracranial hemorrhage. No mass effect midline shift. No extra-axial fluid collection. Vascular: No hyperdense vessel or unexpected calcification. Skull: Normal. Negative for fracture or focal lesion. Sinuses/Orbits: No acute finding. Other: None CT CERVICAL SPINE FINDINGS Alignment: No acute subluxation. There is straightening of normal cervical lordosis which may be positional or due to muscle spasm. Skull base and vertebrae: No acute fracture. Soft tissues and spinal canal: No prevertebral fluid or swelling. No visible canal hematoma. Disc levels:  Degenerative changes. Upper chest: Negative. Other: Bilateral carotid bulb calcified plaques. IMPRESSION: 1. No acute intracranial pathology. Mild age-related atrophy and chronic microvascular ischemic changes. 2. No acute/traumatic cervical spine pathology. Electronically Signed   By: Anner Crete M.D.   On: 05/15/2021 19:57   CT Cervical Spine Wo Contrast  Result Date: 05/15/2021 CLINICAL DATA:  Trauma. EXAM: CT HEAD WITHOUT CONTRAST CT CERVICAL SPINE WITHOUT CONTRAST TECHNIQUE: Multidetector CT imaging of the head and cervical spine was performed following the standard protocol without intravenous contrast. Multiplanar CT image reconstructions of the cervical spine were also generated. COMPARISON:  None. FINDINGS: CT HEAD FINDINGS Brain: Mild age-related atrophy and chronic microvascular ischemic changes. There is no acute intracranial hemorrhage. No mass effect midline shift. No extra-axial fluid collection. Vascular: No hyperdense vessel or unexpected calcification. Skull: Normal. Negative for fracture or focal lesion. Sinuses/Orbits: No acute finding. Other: None CT CERVICAL SPINE FINDINGS Alignment: No acute subluxation. There is  straightening of normal cervical lordosis which may be positional or due to muscle spasm. Skull base and vertebrae: No acute fracture. Soft tissues and spinal canal: No prevertebral fluid or swelling. No visible canal hematoma. Disc levels:  Degenerative changes. Upper chest: Negative. Other: Bilateral carotid bulb calcified plaques. IMPRESSION: 1. No acute intracranial pathology. Mild age-related atrophy and chronic microvascular ischemic changes. 2. No acute/traumatic cervical spine pathology. Electronically Signed   By: Anner Crete M.D.   On: 05/15/2021 19:57   DG Chest Portable 1 View  Result Date: 05/15/2021 CLINICAL DATA:  Fall. EXAM: PORTABLE CHEST 1 VIEW COMPARISON:  Chest radiograph dated 03/22/2021. FINDINGS: Left lung base atelectasis or infiltrate. Right lung is clear. No pneumothorax. Stable cardiomegaly. No acute osseous pathology. Osteopenia with degenerative changes of the spine. IMPRESSION: 1. Left lung base atelectasis or infiltrate. 2. Stable cardiomegaly. Electronically Signed   By: Anner Crete M.D.   On: 05/15/2021 19:51   DG Hip Unilat W or Wo Pelvis 2-3 Views Left  Result Date: 05/15/2021 CLINICAL DATA:  Fall left hip pain. EXAM: DG HIP (WITH OR WITHOUT PELVIS) 2-3V LEFT COMPARISON:  Left hip CT dated 03/28/2021. FINDINGS: No acute fracture or dislocation. The bones are osteopenic. Vascular calcifications noted. The soft tissues are grossly unremarkable. IMPRESSION: No acute fracture or dislocation. Electronically Signed   By: Anner Crete M.D.   On: 05/15/2021 19:50    Procedures Procedures   Medications Ordered in ED Medications - No data to display  ED Course  I have reviewed the triage vital signs and the nursing notes.  Pertinent labs & imaging results that were available during my care of the patient were reviewed by me and considered in my medical decision making (see chart for details).    MDM Rules/Calculators/A&P                            NIRAJ KUDRNA is a 87 y.o. male with a past medical history significant for CKD, hypertension, hyperlipidemia, diabetes, previous heart block, atrial fibrillation that cannot be on blood thinners due to GI bleeding, CAD, COPD, CHF, and anemia who presents with multiple falls.  According to family, patient has had 3 falls this week including today.  He reports that this is a more mechanical fall today where he was using his walker try to open a door and it slammed shut on him causing him to fall backwards.  He does report hitting his head and is having pain in his bilateral paraspinal upper back but denies any chest pain.  Denies any headache or neck pain at this time.  Family reports that patient's left leg has been weaker than the right for the last few weeks and they are concerned he may have had a stroke leading to some of these falls.  Patient has history of intermittent A. fib but cannot be on blood thinners due to the falls and previous bleeding requiring transfusions.  He denies any nausea, vomiting, constipation, or diarrhea.  Denies any urinary changes.  He is chronically incontinent of urine and wears depends.  Family denies him having recent fevers, chills congestion, or cough.  He denies any chest pain or abdominal pain currently.  Family reported that his left hip appeared injured initially but patient reports it is feeling better now.  They report he has skin tears on his right knee and his right elbow but think his tetanus is up-to-date.  On exam, patient does need to have skin tear to his right knee and right elbow.  Otherwise he has intact grip strength bilaterally and intact sensation the arms.  He has intact pulses, and sensation distally in his right leg and has normal strength in the right leg but does have some weakness in the left leg compared to right.  Otherwise he has normal finger-nose-finger testing bilaterally.  Symmetric smile.  Clear speech.  Pupils were asymmetric with some  dilated right pupil compared to left but both reactive.  Normal extraocular movements.  Neck was nontender.  Head was nontender.  Patient had tenderness in his paraspinal back with bruising on both sides of his lateral back.  No midline tenderness in the back.  No tenderness in the hips however he reported having some pain previously in the left hip after the fall today.  Had a long shared decision-making conversation with family.  They are concerned that he has had multiple falls this week.  We agreed to get CT of the head and neck and will also get x-rays of his chest to look at rib injury.  We will get x-ray  of the pelvis and left hip but due to the lack of any pain or tenderness over the locations of skin tears, they agreed to hold on x-ray of the elbow and knee.  We will get some screening labs as well as looking for pneumonia that is occult or UTI.  Tetanus is up-to-date.  Anticipate reassessment after work-up.   Patient's work-up returned.  CT of the head and neck did not show acute traumatic injuries.  X-ray of the hip/pelvis did not show acute fracture.  Chest x-ray showed likely atelectasis but no rib fractures.  Doubt pneumonia based on lack of any cough, fevers, or chills.  Patient's labs did show mild leukocytosis but hemoglobin was improved from prior.  Creatinine was significant elevated compared to last time up to 3.2.  Urinalysis did not show evidence of infection.  Magnesium normal.  Had a shared decision made conversation with patient and family about management.  Given his multiple falls, acute kidney injury, and some concern for dehydration, we felt it was reasonable to discuss admission however he and family wants to go home.  They are scheduled to see PCP in the next few days and then get a recheck of his creatinine at that time.  He also will increase his hydration at home and understands return precautions.  His wounds were cleaned and dressed and his tetanus was up-to-date in the  chart.  Family will take patient home and they agreed with plan of care.  Simona Huh return precautions and patient discharged in good condition.  Final Clinical Impression(s) / ED Diagnoses Final diagnoses:  Fall, initial encounter  AKI (acute kidney injury) ([place])    Rx / DC Orders ED Discharge Orders     None      Clinical Impression: 1. Fall, initial encounter   2. AKI (acute kidney injury) ([place])     Disposition: Discharge  Condition: Good  I have discussed the results, Dx and Tx plan with the pt(& family if present). He/she/they expressed understanding and agree(s) with the plan. Discharge instructions discussed at great length. Strict return precautions discussed and pt &/or family have verbalized understanding of the instructions. No further questions at time of discharge.    Discharge Medication List as of 05/15/2021 11:10 PM      Follow Up: Lujean Amel, MD [address] [place] [address] [facility] [place] [id number] [phone]       Rogen Porte, Gwenyth Allegra, MD 05/15/21 2322

## 2021-05-15 NOTE — ED Notes (Signed)
Patient and family waiting in hallway by lobby doors for discharge papers.

## 2021-05-15 NOTE — ED Notes (Signed)
Pt has  a skin tear on his right outer calf and right elbow. Both wounds were cleaned with wound cleaner and bacitracin was placed on non stick bandage. Wounds were wrapped with ace wrap.

## 2021-05-16 LAB — TSH: TSH: 1.765 u[IU]/mL (ref 0.350–4.500)

## 2021-05-17 LAB — URINE CULTURE: Culture: NO GROWTH

## 2021-05-22 ENCOUNTER — Other Ambulatory Visit: Payer: Self-pay

## 2021-05-22 ENCOUNTER — Encounter: Payer: HMO | Attending: Physician Assistant | Admitting: Physician Assistant

## 2021-05-22 DIAGNOSIS — N184 Chronic kidney disease, stage 4 (severe): Secondary | ICD-10-CM | POA: Insufficient documentation

## 2021-05-22 DIAGNOSIS — L98492 Non-pressure 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: Secondary | ICD-10-CM | POA: Insufficient documentation

## 2021-05-22 DIAGNOSIS — L97312 Non-pressure chronic ulcer of right ankle with fat layer exposed: Secondary | ICD-10-CM | POA: Insufficient documentation

## 2021-05-22 DIAGNOSIS — S80211A Abrasion, right knee, initial encounter: Secondary | ICD-10-CM | POA: Diagnosis not present

## 2021-05-22 DIAGNOSIS — E1122 Type 2 diabetes mellitus with diabetic chronic kidney disease: Secondary | ICD-10-CM | POA: Diagnosis not present

## 2021-05-22 DIAGNOSIS — L97821 Non-pressure chronic ulcer of other part of left lower leg limited to breakdown of skin: Secondary | ICD-10-CM | POA: Diagnosis not present

## 2021-05-22 DIAGNOSIS — D508 Other iron deficiency anemias: Secondary | ICD-10-CM | POA: Insufficient documentation

## 2021-05-22 DIAGNOSIS — L97822 Non-pressure chronic ulcer of other part of left lower leg with fat layer exposed: Secondary | ICD-10-CM | POA: Diagnosis not present

## 2021-05-22 DIAGNOSIS — X58XXXA Exposure to other specified factors, initial encounter: Secondary | ICD-10-CM | POA: Insufficient documentation

## 2021-05-22 DIAGNOSIS — S81812A Laceration without foreign body, left lower leg, initial encounter: Secondary | ICD-10-CM | POA: Insufficient documentation

## 2021-05-22 DIAGNOSIS — E11622 Type 2 diabetes mellitus with other skin ulcer: Secondary | ICD-10-CM | POA: Diagnosis present

## 2021-05-22 DIAGNOSIS — S31821A Laceration without foreign body of left buttock, initial encounter: Secondary | ICD-10-CM | POA: Diagnosis not present

## 2021-05-22 DIAGNOSIS — S50311A Abrasion of right elbow, initial encounter: Secondary | ICD-10-CM | POA: Insufficient documentation

## 2021-05-22 NOTE — Progress Notes (Addendum)
MARTESE, VANATTA ([id number]) Visit Report for 05/22/2021 Chief Complaint Document Details Patient Name: MYSON, LEVI. Date of Service: 05/22/2021 3:45 PM Medical Record Number: [id number] Patient Account Number: [id number] Date of Birth/Sex: Sep 11, 1927 (87 y.o. M) Treating RN: Cornell Barman Primary Care Provider: Dorthy Cooler, [place] Other Clinician: Referring Provider: Dorthy Cooler, [place] Treating Provider/Extender: Skipper Cliche in Treatment: 4 Information Obtained from: Patient Chief Complaint Left leg, right ankle, and left gluteal ulcerations Electronic Signature(s) Signed: 05/22/2021 4:00:03 PM By: Worthy Keeler PA-C Entered By: Worthy Keeler on 05/22/2021 16:00:02 [name] ([id number]) -------------------------------------------------------------------------------- HPI Details Patient Name: [name] Date of Service: 05/22/2021 3:45 PM Medical Record Number: [id number] Patient Account Number: [id number] Date of Birth/Sex: 1927/10/09 (87 y.o. M) Treating RN: Cornell Barman Primary Care Provider: Dorthy Cooler, D[place] Other Clinician: Levora Dredge Referring Provider: Dorthy Cooler, [place] Treating Provider/Extender: Skipper Cliche in Treatment: 4 History of Present Illness HPI Description: 04/24/2021 upon evaluation today patient presents for initial inspection here in the clinic concerning an issue that has been going on with wounds on the right lateral ankle region, left lower leg, and left gluteal location. Unfortunately the patient does have significant anemia and had a blood transfusion in September. He has been having erythropoietin shots and lives [facility] [place]. Most of these wounds are actually stated to be trauma/laceration in nature. The patient does have diabetes mellitus type 2, chronic kidney disease stage IV, and iron deficiency anemia. 05/08/2021 upon evaluation today patient's left leg actually appears to be doing pretty well with just a couple  dry areas at this point there is no real need for any specific dressings at this point in my opinion. I think the biggest thing is just a compression sock and keeping this area as dry as possible and from swelling. In regard to the right leg there does appear to be some evidence of cellulitis which has been a little bit more concerned here. Is a little warm to touch not sure if this is just inflammation due to venous stasis or if there is actual infection here again there is really not a good area to culture although the good news is the patient does seem to be doing quite well with regard to the healing in this ankle region. 05/22/2021 upon evaluation today patient appears to be doing somewhat poorly in regard to his wounds. He has been tolerating the dressing changes in general and I think the ankle is doing okay though a little bit hypergranular. In regard to the remaining wounds most of these were all new as result of skin tears from falls that he has been having frequently. He is currently at [facility] in [place]. This is an assisted living. With that being said I am beginning to wonder whether assisted living is really the proper placement for him to be honest. Electronic Signature(s) Signed: 05/23/2021 8:07:20 AM By: Worthy Keeler PA-C Entered By: Worthy Keeler on 05/23/2021 08:07:20 [name] ([id number]) -------------------------------------------------------------------------------- Physical Exam Details Patient Name: [name]. Date of Service: 05/22/2021 3:45 PM Medical Record Number: [id number] Patient Account Number: [id number] Date of Birth/Sex: Nov 22, 1927 (87 y.o. M) Treating RN: Cornell Barman Primary Care Provider: Dorthy Cooler, Dibas Other Clinician: Levora Dredge Referring Provider: Dorthy Cooler, Dibas Treating Provider/Extender: Skipper Cliche in Treatment: 4 Constitutional Well-nourished and well-hydrated in no acute distress. Respiratory normal breathing  without difficulty. Psychiatric this patient is able to make decisions and demonstrates good insight into disease process. Alert and Oriented x  3. pleasant and cooperative. Notes Upon inspection patient's wounds appear to be multiple skin tears over the elbows bilaterally, right knee, and subsequently he still has the ankle ulcer. I do believe Hydrofera Blue to could do well for the ankle in regard to the knee and elbow skin tears I think that the best thing here is probably can to be the use of Xeroform gauze dressings. Also think that the Xeroform gauze can be beneficial for the gluteal region as well which is actually looking quite well. Electronic Signature(s) Signed: 05/23/2021 8:08:00 AM By: Worthy Keeler PA-C Entered By: Worthy Keeler on 05/23/2021 08:08:00 [name] ([id number]) -------------------------------------------------------------------------------- Physician Orders Details Patient Name: BUBBER, ROTHERT. Date of Service: 05/22/2021 3:45 PM Medical Record Number: [id number] Patient Account Number: [id number] Date of Birth/Sex: 1927/08/21 (87 y.o. M) Treating RN: Levora Dredge Primary Care Provider: Dorthy Cooler, Dibas Other Clinician: Cornell Barman Referring Provider: Dorthy Cooler, Dibas Treating Provider/Extender: Skipper Cliche in Treatment: 4 Verbal / Phone Orders: No Diagnosis Coding ICD-10 Coding Code Description E11.622 Type 2 diabetes mellitus with other skin ulcer S81.812A Laceration without foreign body, left lower leg, initial encounter L97.312 Non-pressure chronic ulcer of right ankle with fat layer exposed L97.822 Non-pressure chronic ulcer of other part of left lower leg with fat layer exposed L97.821 Non-pressure chronic ulcer of other part of left lower leg limited to breakdown of skin S31.821A Laceration without foreign body of left buttock, initial encounter L98.492 Non-pressure 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 N18.4  Chronic kidney disease, stage 4 (severe) 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 Follow-up Appointments o Return Appointment in 2 weeks. [place]: - Fax orders to [facility] AT [place], [place], [place] for wound care. May utilize formulary equivalent dressing for wound treatment orders unless otherwise specified. Home Health Nurse may visit PRN to address patientos wound care needs. o Scheduled days for dressing changes to be completed; exception, patient has scheduled wound care visit that day. o **Please direct any NON-WOUND related issues/requests for orders to patient's Primary Care Physician. **If current dressing causes regression in wound condition, may D/C ordered dressing product/s and apply Normal Saline Moist Dressing daily until next [place] or Other MD appointment. **Notify Wound Healing Center of regression in wound condition at [phone]. Bathing/ Shower/ Hygiene o May shower; gently cleanse wound with antibacterial soap, rinse and pat dry prior to dressing wounds - kEEP DRESSING DRY OR CHANGE AFTER SHOWER o No tub bath. Edema Control - Lymphedema / Segmental Compressive Device / Other o Patient to wear own compression stockings. Remove compression stockings every night before going to bed and put on every morning when getting up. o Elevate, Exercise Daily and Avoid Standing for Long Periods of Time. o Elevate legs to the level of the heart and pump ankles as often as possible o Elevate leg(s) parallel to the floor when sitting. o DO YOUR BEST to sleep in the bed at night. DO NOT sleep in your recliner. Long hours of sitting in a recliner leads to swelling of the legs and/or potential wounds on your backside. Additional Orders / Instructions o Follow Nutritious Diet and Increase Protein Intake Wound Treatment Wound #1 - Ankle Wound Laterality: Right,  Lateral Cleanser: Soap and Water (Home Health) 3 x Per Week/30 Days Discharge Instructions: Gently cleanse wound with antibacterial soap, rinse and pat dry prior to dressing wounds Cleanser: Wound Cleanser ([place])  3 x Per Week/30 Days Discharge Instructions: Wash your hands with soap and water. Remove old dressing, discard into plastic bag and place into trash. Cleanse the wound with Wound Cleanser prior to applying a clean dressing using gauze sponges, not tissues or cotton balls. Do not scrub or use excessive force. Pat dry using gauze sponges, not tissue or cotton balls. Primary Dressing: Hydrofera Blue Ready Transfer Foam, 2.5x2.5 (in/in) (Home Health) (Generic) 3 x Per Week/30 Days TRYSTYN, DOLLEY ([id number]) Discharge Instructions: Apply Hydrofera Blue Ready to wound bed as directed Secondary Dressing: Zetuvit Plus Silicone Border Dressing 4x4 (in/in) ([place]) (Generic) 3 x Per Week/30 Days Wound #10 - Gluteus Wound Laterality: Right Primary Dressing: Xeroform-HBD 2x2 (in/in) 3 x Per Week/30 Days Discharge Instructions: Apply Xeroform-HBD 2x2 (in/in) as directed Secondary Dressing: Zetuvit Plus Silicone Border Dressing 5x5 (in/in) 3 x Per Week/30 Days Wound #5 - Elbow Wound Laterality: Left, Anterior Primary Dressing: Xeroform-HBD 2x2 (in/in) 3 x Per Week/30 Days Discharge Instructions: Apply Xeroform-HBD 2x2 (in/in) as directed Secondary Dressing: Zetuvit Plus Silicone Border Dressing 5x5 (in/in) 3 x Per Week/30 Days Wound #6 - Elbow Wound Laterality: Left, Posterior Primary Dressing: Xeroform-HBD 2x2 (in/in) 3 x Per Week/30 Days Discharge Instructions: Apply Xeroform-HBD 2x2 (in/in) as directed Secondary Dressing: Zetuvit Plus Silicone Border Dressing 5x5 (in/in) 3 x Per Week/30 Days Wound #7 - Elbow Wound Laterality: Right, Anterior Primary Dressing: Xeroform-HBD 2x2 (in/in) 3 x Per Week/30 Days Discharge Instructions: Apply Xeroform-HBD 2x2 (in/in) as  directed Secondary Dressing: Zetuvit Plus Silicone Border Dressing 5x5 (in/in) 3 x Per Week/30 Days Wound #8 - Elbow Wound Laterality: Right, Posterior Primary Dressing: Xeroform-HBD 2x2 (in/in) 3 x Per Week/30 Days Discharge Instructions: Apply Xeroform-HBD 2x2 (in/in) as directed Secondary Dressing: Zetuvit Plus Silicone Border Dressing 5x5 (in/in) 3 x Per Week/30 Days Wound #9 - Lower Leg Wound Laterality: Right Primary Dressing: Xeroform-HBD 2x2 (in/in) 3 x Per Week/30 Days Discharge Instructions: Apply Xeroform-HBD 2x2 (in/in) as directed Secondary Dressing: Zetuvit Plus Silicone Border Dressing 5x5 (in/in) 3 x Per Week/30 Days Electronic Signature(s) Signed: 05/22/2021 5:23:39 PM By: Gretta Cool, BSN, RN, CWS, Kim RN, BSN Signed: 05/23/2021 9:26:06 AM By: Worthy Keeler PA-C Entered By: Gretta Cool, BSN, RN, CWS, Kim on 05/22/2021 17:07:59 [name] ([id number]) -------------------------------------------------------------------------------- Problem List Details Patient Name: DARTANIAN, KNAGGS. Date of Service: 05/22/2021 3:45 PM Medical Record Number: [id number] Patient Account Number: [id number] Date of Birth/Sex: 18-Nov-1927 (87 y.o. M) Treating RN: Cornell Barman Primary Care Provider: Dorthy Cooler, Dib[place] Other Clinician: Referring Provider: Dorthy Cooler, D[place] Treating Provider/Extender: Skipper Cliche in Treatment: 4 Active Problems ICD-10 Encounter Code Description Active Date MDM Diagnosis E11.622 Type 2 diabetes mellitus with other skin ulcer 04/24/2021 No Yes S81.812A Laceration without foreign body, left lower leg, initial encounter 04/24/2021 No Yes L97.312 Non-pressure chronic ulcer of right ankle with fat layer exposed 04/24/2021 No Yes L97.822 Non-pressure chronic ulcer of other part of left lower leg with fat layer 04/24/2021 No Yes exposed L97.821 Non-pressure chronic ulcer of other part of left lower leg limited to 04/24/2021 No Yes breakdown of skin S31.821A Laceration without  foreign body of left buttock, initial encounter 04/24/2021 No Yes L98.492 Non-pressure 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 04/24/2021 No Yes N18.4 Chronic kidney disease, stage 4 (severe) 04/24/2021 No Yes 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 04/24/2021 No Yes S50.311A Abrasion of right elbow, initial encounter 05/22/2021 No Yes S50.312A Abrasion of left elbow, initial encounter 05/22/2021 No Yes S80.211A Abrasion, right knee, initial  encounter 05/22/2021 No Yes TOMISLAV, MICALE ([id number]) Inactive Problems Resolved Problems Electronic Signature(s) Signed: 05/22/2021 5:12:19 PM By: Worthy Keeler PA-C Previous Signature: 05/22/2021 3:59:57 PM Version By: Worthy Keeler PA-C Entered By: Worthy Keeler on 05/22/2021 17:12:19 [name] ([id number]) -------------------------------------------------------------------------------- Progress Note Details Patient Name: [name] Date of Service: 05/22/2021 3:45 PM Medical Record Number: [id number] Patient Account Number: [id number] Date of Birth/Sex: [birth year] (87 y.o. M) Treating RN: Cornell Barman Primary Care Provider: Dorthy Cooler, Dibas Other Clinician: Levora Dredge Referring Provider: Dorthy Cooler, [place] Treating Provider/Extender: Skipper Cliche in Treatment: 4 Subjective Chief Complaint Information obtained from Patient Left leg, right ankle, and left gluteal ulcerations History of Present Illness (HPI) 04/24/2021 upon evaluation today patient presents for initial inspection here in the clinic concerning an issue that has been going on with wounds on the right lateral ankle region, left lower leg, and left gluteal location. Unfortunately the patient does have significant anemia and had a blood transfusion in September. He has been having erythropoietin shots and lives at [facility] in [place]. Most of these wounds are actually stated to be trauma/laceration in nature. The patient does have diabetes  mellitus type 2, chronic kidney disease stage IV, and iron deficiency anemia. 05/08/2021 upon evaluation today patient's left leg actually appears to be doing pretty well with just a couple dry areas at this point there is no real need for any specific dressings at this point in my opinion. I think the biggest thing is just a compression sock and keeping this area as dry as possible and from swelling. In regard to the right leg there does appear to be some evidence of cellulitis which has been a little bit more concerned here. Is a little warm to touch not sure if this is just inflammation due to venous stasis or if there is actual infection here again there is really not a good area to culture although the good news is the patient does seem to be doing quite well with regard to the healing in this ankle region. 05/22/2021 upon evaluation today patient appears to be doing somewhat poorly in regard to his wounds. He has been tolerating the dressing changes in general and I think the ankle is doing okay though a little bit hypergranular. In regard to the remaining wounds most of these were all new as result of skin tears from falls that he has been having frequently. He is currently at [facility] in [place]. This is an assisted living. With that being said I am beginning to wonder whether assisted living is really the proper placement for him to be honest. Objective Constitutional Well-nourished and well-hydrated in no acute distress. Vitals Time Taken: 3:48 PM, Height: 69 in, Weight: 198 lbs, BMI: 29.2, Temperature: 97.7 F, Pulse: 82 bpm, Respiratory Rate: 18 breaths/min, Blood Pressure: 174/70 mmHg. Respiratory normal breathing without difficulty. Psychiatric this patient is able to make decisions and demonstrates good insight into disease process. Alert and Oriented x 3. pleasant and cooperative. General Notes: Upon inspection patient's wounds appear to be multiple skin tears over the  elbows bilaterally, right knee, and subsequently he still has the ankle ulcer. I do believe Hydrofera Blue to could do well for the ankle in regard to the knee and elbow skin tears I think that the best thing here is probably can to be the use of Xeroform gauze dressings. Also think that the Xeroform gauze can be beneficial for the gluteal region as well which is actually  looking quite well. Integumentary (Hair, Skin) Wound #1 status is Open. Original cause of wound was Gradually Appeared. The date acquired was: 12/26/2020. The wound has been in treatment 4 weeks. The wound is located on the Right,Lateral Ankle. The wound measures 1.7cm length x 0.8cm width x 0.2cm depth; 1.068cm^2 area and 0.214cm^3 volume. There is Fat Layer (Subcutaneous Tissue) exposed. There is no tunneling or undermining noted. There is a large amount of serous drainage noted. The wound margin is well defined and not attached to the wound base. There is medium (34-66%) red, hyper - granulation within the wound bed. There is a medium (34-66%) amount of necrotic tissue within the wound bed including Adherent Slough. Wound #10 status is Open. Original cause of wound was Trauma. The date acquired was: 05/16/2020. The wound is located on the Right Gluteus. The wound measures 1.3cm length x 1.6cm width x 0.1cm depth; 1.634cm^2 area and 0.163cm^3 volume. There is Fat Layer (Subcutaneous Tissue) exposed. There is no tunneling or undermining noted. There is a medium amount of serosanguineous drainage noted. There is small (1-33%) pink granulation within the wound bed. There is a medium (34-66%) amount of necrotic tissue within the wound bed including Adherent VITALIY, EISENHOUR ([id number]) [place]. Wound #2 status is Healed - Epithelialized. Original cause of wound was Skin Tear/Laceration. The date acquired was: 04/01/2021. The wound has been in treatment 4 weeks. The wound is located on the Left,Proximal,Midline Lower Leg. The wound  measures 0cm length x 0cm width x 0cm depth; 0cm^2 area and 0cm^3 volume. There is a medium amount of serous drainage noted. Wound #3 status is Healed - Epithelialized. Original cause of wound was Skin Tear/Laceration. The date acquired was: 04/03/2021. The wound has been in treatment 4 weeks. The wound is located on the Left,Midline,Posterior Lower Leg. The wound measures 0cm length x 0cm width x 0cm depth; 0cm^2 area and 0cm^3 volume. There is a none present amount of drainage noted. Wound #5 status is Open. Original cause of wound was Trauma. The date acquired was: 05/15/2021. The wound is located on the Left,Anterior Elbow. The wound measures 0.6cm length x 1.5cm width x 0.1cm depth; 0.707cm^2 area and 0.071cm^3 volume. There is Fat Layer (Subcutaneous Tissue) exposed. There is a medium amount of serosanguineous drainage noted. There is large (67-100%) red granulation within the wound bed. There is no necrotic tissue within the wound bed. Wound #6 status is Open. Original cause of wound was Trauma. The date acquired was: 05/15/2021. The wound is located on the Left,Posterior Elbow. The wound measures 3.5cm length x 1.7cm width x 0.1cm depth; 4.673cm^2 area and 0.467cm^3 volume. There is Fat Layer (Subcutaneous Tissue) exposed. There is a medium amount of serosanguineous drainage noted. There is large (67-100%) red granulation within the wound bed. There is a small (1-33%) amount of necrotic tissue within the wound bed including Eschar. Wound #7 status is Open. Original cause of wound was Trauma. The date acquired was: 05/19/2021. The wound is located on the Right,Anterior Elbow. The wound measures 0.7cm length x 0.4cm width x 0.1cm depth; 0.22cm^2 area and 0.022cm^3 volume. There is Fat Layer (Subcutaneous Tissue) exposed. There is no tunneling or undermining noted. There is a medium amount of serosanguineous drainage noted. There is large (67-100%) red granulation within the wound bed. There is a  small (1-33%) amount of necrotic tissue within the wound bed including Eschar. Wound #8 status is Open. Original cause of wound was Trauma. The date acquired was: 05/19/2021. The wound is located  on the Right,Posterior Elbow. The wound measures 1cm length x 1.5cm width x 0.1cm depth; 1.178cm^2 area and 0.118cm^3 volume. There is Fat Layer (Subcutaneous Tissue) exposed. There is a medium amount of serosanguineous drainage noted. There is large (67-100%) red granulation within the wound bed. There is a small (1-33%) amount of necrotic tissue within the wound bed including Eschar and Adherent Slough. Wound #9 status is Open. Original cause of wound was Trauma. The date acquired was: 05/15/2021. The wound is located on the Right Lower Leg. The wound measures 3cm length x 4.4cm width x 0.1cm depth; 10.367cm^2 area and 1.037cm^3 volume. There is Fat Layer (Subcutaneous Tissue) exposed. There is no tunneling or undermining noted. There is a medium amount of serosanguineous drainage noted. There is medium (34-66%) red granulation within the wound bed. There is a medium (34-66%) amount of necrotic tissue within the wound bed including Eschar and Adherent Slough. Assessment Active Problems ICD-10 Type 2 diabetes mellitus with other skin ulcer Laceration without foreign body, left lower leg, initial encounter Non-pressure chronic ulcer of right ankle with fat layer exposed Non-pressure chronic ulcer of other part of left lower leg with fat layer exposed Non-pressure chronic ulcer of other part of left lower leg limited to breakdown of skin Laceration without foreign body of left buttock, initial encounter Non-pressure 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 Chronic kidney disease, stage 4 (severe) Other iron deficiency anemias Abrasion of right elbow, initial encounter Abrasion of left elbow, initial encounter Abrasion, right knee, initial encounter Plan Follow-up  Appointments: Return Appointment in 2 weeks. Home Health: [place]: - Fax orders to [facility] AT [facility], [place], [place] for wound care. May utilize formulary equivalent dressing for wound treatment orders unless otherwise specified. Home Health Nurse may visit PRN to address patient s wound care needs. Scheduled days for dressing changes to be completed; exception, patient has scheduled wound care visit that day. **Please direct any NON-WOUND related issues/requests for orders to patient's Primary Care Physician. **If current dressing causes regression in wound condition, may D/C ordered dressing product/s and apply Normal Saline Moist Dressing daily until next [place] or Other MD appointment. **Notify Wound Healing Center of regression in wound condition at [phone]. Bathing/ Shower/ Hygiene: May shower; gently cleanse wound with antibacterial soap, rinse and pat dry prior to dressing wounds - kEEP DRESSING DRY OR CHANGE AFTER LEV, CERVONE ([id number]) SHOWER No tub bath. Edema Control - Lymphedema / Segmental Compressive Device / Other: Patient to wear own compression stockings. Remove compression stockings every night before going to bed and put on every morning when getting up. Elevate, Exercise Daily and Avoid Standing for Long Periods of Time. Elevate legs to the level of the heart and pump ankles as often as possible Elevate leg(s) parallel to the floor when sitting. DO YOUR BEST to sleep in the bed at night. DO NOT sleep in your recliner. Long hours of sitting in a recliner leads to swelling of the legs and/or potential wounds on your backside. Additional Orders / Instructions: Follow Nutritious Diet and Increase Protein Intake WOUND #1: - Ankle Wound Laterality: Right, Lateral Cleanser: Soap and Water (Home Health) 3 x Per Week/30 Days Discharge Instructions: Gently cleanse wound with  antibacterial soap, rinse and pat dry prior to dressing wounds Cleanser: Wound Cleanser ([place]) 3 x Per Week/30 Days Discharge Instructions: Wash your hands with soap and water. Remove old dressing, discard into  plastic bag and place into trash. Cleanse the wound with Wound Cleanser prior to applying a clean dressing using gauze sponges, not tissues or cotton balls. Do not scrub or use excessive force. Pat dry using gauze sponges, not tissue or cotton balls. Primary Dressing: Hydrofera Blue Ready Transfer Foam, 2.5x2.5 (in/in) (Home Health) (Generic) 3 x Per Week/30 Days Discharge Instructions: Apply Hydrofera Blue Ready to wound bed as directed Secondary Dressing: Zetuvit Plus Silicone Border Dressing 4x4 (in/in) (Home Health) (Generic) 3 x Per Week/30 Days WOUND #10: - Gluteus Wound Laterality: Right Primary Dressing: Xeroform-HBD 2x2 (in/in) 3 x Per Week/30 Days Discharge Instructions: Apply Xeroform-HBD 2x2 (in/in) as directed Secondary Dressing: Zetuvit Plus Silicone Border Dressing 5x5 (in/in) 3 x Per Week/30 Days WOUND #5: - Elbow Wound Laterality: Left, Anterior Primary Dressing: Xeroform-HBD 2x2 (in/in) 3 x Per Week/30 Days Discharge Instructions: Apply Xeroform-HBD 2x2 (in/in) as directed Secondary Dressing: Zetuvit Plus Silicone Border Dressing 5x5 (in/in) 3 x Per Week/30 Days WOUND #6: - Elbow Wound Laterality: Left, Posterior Primary Dressing: Xeroform-HBD 2x2 (in/in) 3 x Per Week/30 Days Discharge Instructions: Apply Xeroform-HBD 2x2 (in/in) as directed Secondary Dressing: Zetuvit Plus Silicone Border Dressing 5x5 (in/in) 3 x Per Week/30 Days WOUND #7: - Elbow Wound Laterality: Right, Anterior Primary Dressing: Xeroform-HBD 2x2 (in/in) 3 x Per Week/30 Days Discharge Instructions: Apply Xeroform-HBD 2x2 (in/in) as directed Secondary Dressing: Zetuvit Plus Silicone Border Dressing 5x5 (in/in) 3 x Per Week/30 Days WOUND #8: - Elbow Wound Laterality: Right,  Posterior Primary Dressing: Xeroform-HBD 2x2 (in/in) 3 x Per Week/30 Days Discharge Instructions: Apply Xeroform-HBD 2x2 (in/in) as directed Secondary Dressing: Zetuvit Plus Silicone Border Dressing 5x5 (in/in) 3 x Per Week/30 Days WOUND #9: - Lower Leg Wound Laterality: Right Primary Dressing: Xeroform-HBD 2x2 (in/in) 3 x Per Week/30 Days Discharge Instructions: Apply Xeroform-HBD 2x2 (in/in) as directed Secondary Dressing: Zetuvit Plus Silicone Border Dressing 5x5 (in/in) 3 x Per Week/30 Days 1. I recommend currently that we initiate treatment with [facility] for the right ankle as the optimal choice at this point. 2. I am also can recommend that we go ahead and initiate Xeroform gauze dressings for all other wound locations. 3. I am also going to suggest that we should continue to monitor for any signs of worsening or infection. Obviously if anything changes patient should let me know soon as possible. We will see patient back for reevaluation in 2 weeks here in the clinic. If anything worsens or changes patient will contact our office for additional recommendations. Electronic Signature(s) Signed: 05/23/2021 8:08:37 AM By: Worthy Keeler PA-C Entered By: Worthy Keeler on 05/23/2021 08:08:37 [name] ([id number]) -------------------------------------------------------------------------------- SuperBill Details Patient Name: [name] Date of Service: 05/22/2021 Medical Record Number: [id number] Patient Account Number: [id number] Date of Birth/Sex: 08/13/1927 (87 y.o. 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 L97.821 Non-pressure chronic ulcer of other part of left lower leg limited to breakdown of skin S31.821A Laceration without foreign body of left buttock, initial encounter L98.492 Non-pressure 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 N18.4 Chronic kidney disease, stage  4 (severe) 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 S50.311A Abrasion of right elbow, initial encounter S50.312A Abrasion of left elbow, initial encounter S80.211A Abrasion, right knee, initial encounter Facility Procedures CPT4 Code: 09643838 Description: [id number] - WOUND CARE VISIT-LEV 5 EST PT Modifier: Quantity: 1 Physician Procedures CPT4 Code: 7543606 Description: 77034 - WC PHYS LEVEL 4 - EST PT Modifier: Quantity: 1 CPT4 Code: Description: ICD-10 Diagnosis Description E11.622 Type 2 diabetes mellitus with other skin ulcer S81.812A Laceration without foreign body, left lower leg, initial encounter S50.311A Abrasion of right elbow, initial encounter S50.312A Abrasion of left  elbow, initial encounter Modifier: Quantity: Electronic Signature(s) Signed: 05/22/2021 5:12:56 PM By: Worthy Keeler PA-C Previous Signature: 05/22/2021 5:08:28 PM Version By: Gretta Cool, BSN, RN, CWS, Kim RN, BSN Previous Signature: 05/22/2021 4:57:16 PM Version By: Gretta Cool, BSN, RN, CWS, Kim RN, BSN Entered By: Worthy Keeler on 05/22/2021 17:12:56

## 2021-05-23 ENCOUNTER — Inpatient Hospital Stay: Payer: HMO

## 2021-05-23 ENCOUNTER — Inpatient Hospital Stay: Payer: HMO | Attending: Hematology and Oncology

## 2021-05-23 ENCOUNTER — Telehealth: Payer: Self-pay | Admitting: Physician Assistant

## 2021-05-23 VITALS — BP 129/58 | HR 90 | Temp 97.7°F | Resp 16

## 2021-05-23 DIAGNOSIS — D460 Refractory anemia without ring sideroblasts, so stated: Secondary | ICD-10-CM | POA: Diagnosis not present

## 2021-05-23 DIAGNOSIS — E611 Iron deficiency: Secondary | ICD-10-CM

## 2021-05-23 DIAGNOSIS — N184 Chronic kidney disease, stage 4 (severe): Secondary | ICD-10-CM

## 2021-05-23 DIAGNOSIS — Z79899 Other long term (current) drug therapy: Secondary | ICD-10-CM | POA: Diagnosis not present

## 2021-05-23 DIAGNOSIS — D631 Anemia in chronic kidney disease: Secondary | ICD-10-CM

## 2021-05-23 DIAGNOSIS — D649 Anemia, unspecified: Secondary | ICD-10-CM

## 2021-05-23 LAB — CBC WITH DIFFERENTIAL (CANCER CENTER ONLY)
Abs Immature Granulocytes: 0.05 10*3/uL (ref 0.00–0.07)
Basophils Absolute: 0.1 10*3/uL (ref 0.0–0.1)
Basophils Relative: 1 %
Eosinophils Absolute: 0.4 10*3/uL (ref 0.0–0.5)
Eosinophils Relative: 4 %
HCT: 27 % — ABNORMAL LOW (ref 39.0–52.0)
Hemoglobin: 8.3 g/dL — ABNORMAL LOW (ref 13.0–17.0)
Immature Granulocytes: 0 %
Lymphocytes Relative: 8 %
Lymphs Abs: 0.9 10*3/uL (ref 0.7–4.0)
MCH: 26.1 pg (ref 26.0–34.0)
MCHC: 30.7 g/dL (ref 30.0–36.0)
MCV: 84.9 fL (ref 80.0–100.0)
Monocytes Absolute: 1.1 10*3/uL — ABNORMAL HIGH (ref 0.1–1.0)
Monocytes Relative: 10 %
Neutro Abs: 8.8 10*3/uL — ABNORMAL HIGH (ref 1.7–7.7)
Neutrophils Relative %: 77 %
Platelet Count: 389 10*3/uL (ref 150–400)
RBC: 3.18 MIL/uL — ABNORMAL LOW (ref 4.22–5.81)
RDW: 20.7 % — ABNORMAL HIGH (ref 11.5–15.5)
WBC Count: 11.3 10*3/uL — ABNORMAL HIGH (ref 4.0–10.5)
nRBC: 0 % (ref 0.0–0.2)

## 2021-05-23 LAB — CMP (CANCER CENTER ONLY)
ALT: 21 U/L (ref 0–44)
AST: 27 U/L (ref 15–41)
Albumin: 2.9 g/dL — ABNORMAL LOW (ref 3.5–5.0)
Alkaline Phosphatase: 127 U/L — ABNORMAL HIGH (ref 38–126)
Anion gap: 13 (ref 5–15)
BUN: 78 mg/dL — ABNORMAL HIGH (ref 8–23)
CO2: 18 mmol/L — ABNORMAL LOW (ref 22–32)
Calcium: 8.4 mg/dL — ABNORMAL LOW (ref 8.9–10.3)
Chloride: 104 mmol/L (ref 98–111)
Creatinine: 4.44 mg/dL (ref 0.61–1.24)
GFR, Estimated: 12 mL/min — ABNORMAL LOW (ref >60)
Glucose, Bld: 283 mg/dL — ABNORMAL HIGH (ref 70–99)
Potassium: 5 mmol/L (ref 3.5–5.1)
Sodium: 135 mmol/L (ref 135–145)
Total Bilirubin: 0.2 mg/dL — ABNORMAL LOW (ref 0.3–1.2)
Total Protein: 6.7 g/dL (ref 6.5–8.1)

## 2021-05-23 LAB — SAMPLE TO BLOOD BANK

## 2021-05-23 MED ORDER — DARBEPOETIN ALFA 40 MCG/0.4ML IJ SOSY
40.0000 ug | PREFILLED_SYRINGE | Freq: Once | INTRAMUSCULAR | Status: AC
  Administered 2021-05-23: 40 ug via SUBCUTANEOUS
  Filled 2021-05-23: qty 0.4

## 2021-05-23 NOTE — Patient Instructions (Signed)
Darbepoetin Alfa injection ?What is this medication? ?DARBEPOETIN ALFA (dar be POE e tin  AL fa) helps your body make more red blood cells. It is used to treat anemia caused by chronic kidney failure and chemotherapy. ?This medicine may be used for other purposes; ask your health care provider or pharmacist if you have questions. ?COMMON BRAND NAME(S): Aranesp ?What should I tell my care team before I take this medication? ?They need to know if you have any of these conditions: ?blood clotting disorders or history of blood clots ?cancer patient not on chemotherapy ?cystic fibrosis ?heart disease, such as angina, heart failure, or a history of a heart attack ?hemoglobin level of 12 g/dL or greater ?high blood pressure ?low levels of folate, iron, or vitamin B12 ?seizures ?an unusual or allergic reaction to darbepoetin, erythropoietin, albumin, hamster proteins, latex, other medicines, foods, dyes, or preservatives ?pregnant or trying to get pregnant ?breast-feeding ?How should I use this medication? ?This medicine is for injection into a vein or under the skin. It is usually given by a health care professional in a hospital or clinic setting. ?If you get this medicine at home, you will be taught how to prepare and give this medicine. Use exactly as directed. Take your medicine at regular intervals. Do not take your medicine more often than directed. ?It is important that you put your used needles and syringes in a special sharps container. Do not put them in a trash can. If you do not have a sharps container, call your pharmacist or healthcare provider to get one. ?A special MedGuide will be given to you by the pharmacist with each prescription and refill. Be sure to read this information carefully each time. ?Talk to your pediatrician regarding the use of this medicine in children. While this medicine may be used in children as young as 1 month of age for selected conditions, precautions do apply. ?Overdosage: If  you think you have taken too much of this medicine contact a poison control center or emergency room at once. ?NOTE: This medicine is only for you. Do not share this medicine with others. ?What if I miss a dose? ?If you miss a dose, take it as soon as you can. If it is almost time for your next dose, take only that dose. Do not take double or extra doses. ?What may interact with this medication? ?Do not take this medicine with any of the following medications: ?epoetin alfa ?This list may not describe all possible interactions. Give your health care provider a list of all the medicines, herbs, non-prescription drugs, or dietary supplements you use. Also tell them if you smoke, drink alcohol, or use illegal drugs. Some items may interact with your medicine. ?What should I watch for while using this medication? ?Your condition will be monitored carefully while you are receiving this medicine. ?You may need blood work done while you are taking this medicine. ?This medicine may cause a decrease in vitamin B6. You should make sure that you get enough vitamin B6 while you are taking this medicine. Discuss the foods you eat and the vitamins you take with your health care professional. ?What side effects may I notice from receiving this medication? ?Side effects that you should report to your doctor or health care professional as soon as possible: ?allergic reactions like skin rash, itching or hives, swelling of the face, lips, or tongue ?breathing problems ?changes in vision ?chest pain ?confusion, trouble speaking or understanding ?feeling faint or lightheaded, falls ?high blood   pressure ?muscle aches or pains ?pain, swelling, warmth in the leg ?rapid weight gain ?severe headaches ?sudden numbness or weakness of the face, arm or leg ?trouble walking, dizziness, loss of balance or coordination ?seizures (convulsions) ?swelling of the ankles, feet, hands ?unusually weak or tired ?Side effects that usually do not require  medical attention (report to your doctor or health care professional if they continue or are bothersome): ?diarrhea ?fever, chills (flu-like symptoms) ?headaches ?nausea, vomiting ?redness, stinging, or swelling at site where injected ?This list may not describe all possible side effects. Call your doctor for medical advice about side effects. You may report side effects to FDA at 1-800-FDA-1088. ?Where should I keep my medication? ?Keep out of the reach of children. ?Store in a refrigerator between 2 and 8 degrees C (36 and 46 degrees F). Do not freeze. Do not shake. Throw away any unused portion if using a single-dose vial. Throw away any unused medicine after the expiration date. ?NOTE: This sheet is a summary. It may not cover all possible information. If you have questions about this medicine, talk to your doctor, pharmacist, or health care provider. ?? 2022 [employer] (2017-06-24 00:00:00) ? ?

## 2021-05-23 NOTE — Progress Notes (Signed)
Clayton [name], Clayton [name] ([id number]) Visit Report for 05/22/2021 Arrival Information Details Patient Name: Clayton [name], Clayton [name]. Date of Service: 05/22/2021 3:45 PM Medical Record Number: [id number] Patient Account Number: [id number] Date of Birth/Sex: 04/27/1928 (87 y.o. M) Treating RN: Levora Dredge Primary Care Lliam Hoh: Dorthy Cooler, Dibas Other Clinician: Referring Kadeisha Betsch: Dorthy Cooler, Dibas Treating Neka Bise/Extender: Skipper Cliche in Treatment: 4 Visit Information History Since Last Visit Added or deleted any medications: No Patient Arrived: Wheel Chair Any new allergies or adverse reactions: No Arrival Time: 15:45 Had a fall or experienced change in Yes Accompanied By: daughter activities of daily living that may affect Transfer Assistance: EasyPivot Patient Lift risk of falls: Patient Identification Verified: Yes Hospitalized since last visit: Yes Secondary Verification Process Yes Pain Present Now: No Completed: Patient Requires Transmission-Based No Precautions: Patient Has Alerts: Yes Patient Alerts: Patient on Blood Thinner Type II Diabetic 5m aspirin Non-compressible >220 Lives [facility] @[place]  [facility] Electronic Signature(s) Signed: 05/22/2021 4:50:47 PM By: WGretta Cool BSN, RN, CWS, Kim RN, BSN Entered By: WGretta Cool BSN, RN, CWS, Kim on 05/22/2021 16:50:47 Clayton [name]([id number] -------------------------------------------------------------------------------- Clinic Level of Care Assessment Details Patient Name: Clayton [name] Date of Service: 05/22/2021 3:45 PM Medical Record Number: [id number] Account Number: [id number] of Birth/Sex: [birth year](87y.o. M) Treating RN: GLevora DredgePrimary Care Quanasia Defino: KDorthy Cooler Dibas Other Clinician: WCornell BarmanReferring Grizelda Piscopo: KDorthy Cooler Dibas Treating Rye Dorado/Extender: SSkipper Clichein Treatment: 4 Clinic Level of Care Assessment Items TOOL 4 Quantity Score X - Use when only an EandM is performed on  FOLLOW-UP visit 1 0 ASSESSMENTS - Nursing Assessment / Reassessment X - Reassessment of Co-morbidities (includes updates in patient status) 1 10 []  - 0 Reassessment of Adherence to Treatment Plan ASSESSMENTS - Wound and Skin Assessment / Reassessment []  - Simple Wound Assessment / Reassessment - one wound 0 X- 7 5 Complex Wound Assessment / Reassessment - multiple wounds []  - 0 Dermatologic / Skin Assessment (not related to wound area) ASSESSMENTS - Focused Assessment []  - Circumferential Edema Measurements - multi extremities 0 []  - 0 Nutritional Assessment / Counseling / Intervention []  - 0 Lower Extremity Assessment (monofilament, tuning fork, pulses) []  - 0 Peripheral Arterial Disease Assessment (using hand held doppler) ASSESSMENTS - Ostomy and/or Continence Assessment and Care []  - Incontinence Assessment and Management 0 []  - 0 Ostomy Care Assessment and Management (repouching, etc.) PROCESS - Coordination of Care X - Simple Patient / Family Education for ongoing care 1 15 []  - 0 Complex (extensive) Patient / Family Education for ongoing care X- 1 10 Staff obtains CProgrammer, systems Records, Test Results / Process Orders []  - 0 Staff telephones HHA, Nursing Homes / Clarify orders / etc []  - 0 Routine Transfer to another Facility (non-emergent condition) []  - 0 Routine Hospital Admission (non-emergent condition) []  - 0 New Admissions / IBiomedical engineer/ Ordering NPWT, Apligraf, etc. []  - 0 Emergency Hospital Admission (emergent condition) []  - 0 Simple Discharge Coordination X- 1 15 Complex (extensive) Discharge Coordination PROCESS - Special Needs []  - Pediatric / Minor Patient Management 0 []  - 0 Isolation Patient Management []  - 0 Hearing / Language / Visual special needs []  - 0 Assessment of Community assistance (transportation, D/C planning, etc.) []  - 0 Additional assistance / Altered mentation []  - 0 Support Surface(s) Assessment (bed, cushion, seat,  etc.) INTERVENTIONS - Wound Cleansing / Measurement RREQUAN, HARDGE([id number] []  - 0 Simple Wound Cleansing - one wound X- 7 5 Complex Wound Cleansing - multiple wounds X- 1 5 Wound  Imaging (photographs - any number of wounds) []  - 0 Wound Tracing (instead of photographs) []  - 0 Simple Wound Measurement - one wound X- 7 5 Complex Wound Measurement - multiple wounds INTERVENTIONS - Wound Dressings []  - Small Wound Dressing one or multiple wounds 0 X- 7 15 Medium Wound Dressing one or multiple wounds []  - 0 Large Wound Dressing one or multiple wounds []  - 0 Application of Medications - topical []  - 0 Application of Medications - injection INTERVENTIONS - Miscellaneous []  - External ear exam 0 []  - 0 Specimen Collection (cultures, biopsies, blood, body fluids, etc.) []  - 0 Specimen(s) / Culture(s) sent or taken to Lab for analysis []  - 0 Patient Transfer (multiple staff / Civil Service fast streamer / Similar devices) []  - 0 Simple Staple / Suture removal (25 or less) []  - 0 Complex Staple / Suture removal (26 or more) []  - 0 Hypo / Hyperglycemic Management (close monitor of Blood Glucose) []  - 0 Ankle / Brachial Index (ABI) - do not check if billed separately X- 1 5 Vital Signs Has the patient been seen at the hospital within the last three years: Yes Total Score: 270 Level Of Care: New/Established - Level 5 Electronic Signature(s) Signed: 05/22/2021 5:23:39 PM By: Gretta Cool, BSN, RN, CWS, Kim RN, BSN Entered By: Gretta Cool, BSN, RN, CWS, Kim on 05/22/2021 17:08:13 Clayton [name] ([id number]) -------------------------------------------------------------------------------- Encounter Discharge Information Details Patient Name: Clayton [name], Clayton [name]. Date of Service: 05/22/2021 3:45 PM Medical Record Number: [id number] Patient Account Number: [id number] Date of Birth/Sex: 1928/01/14 (87 y.o. M) Treating RN: Levora Dredge Primary Care Shatonia Hoots: Dorthy Cooler, Dibas Other Clinician: Cornell Barman Referring Pauline Trainer: Dorthy Cooler, Dibas Treating Dagon Budai/Extender: Skipper Cliche in Treatment: 4 Encounter Discharge Information Items Discharge Condition: Stable Ambulatory Status: Wheelchair Discharge Destination: Home Transportation: Other Accompanied By: daughter Schedule Follow-up Appointment: Yes Clinical Summary of Care: Electronic Signature(s) Signed: 05/22/2021 5:09:27 PM By: Gretta Cool, BSN, RN, CWS, Kim RN, BSN Entered By: Gretta Cool, BSN, RN, CWS, Kim on 05/22/2021 17:09:27 Clayton [name] ([id number]) -------------------------------------------------------------------------------- Lower Extremity Assessment Details Patient Name: Clayton [name], Clayton [name]. Date of Service: 05/22/2021 3:45 PM Medical Record Number: [id number] Patient Account Number: [id number] Date of Birth/Sex: 11/14/1927 (87 y.o. M) Treating RN: Levora Dredge Primary Care Aubreana Cornacchia: Dorthy Cooler, Dibas Other Clinician: Levora Dredge Referring Ankit Degregorio: Dorthy Cooler, Dibas Treating Tyner Codner/Extender: Skipper Cliche in Treatment: 4 Edema Assessment Assessed: [Left: No] [Right: No] Edema: [Left: No] [Right: No] Vascular Assessment Pulses: Dorsalis Pedis Palpable: [Left:Yes] [Right:Yes] Electronic Signature(s) Signed: 05/22/2021 4:55:15 PM By: Gretta Cool, BSN, RN, CWS, Kim RN, BSN Signed: 05/23/2021 11:58:41 AM By: Levora Dredge Entered By: Gretta Cool BSN, RN, CWS, Kim on 05/22/2021 16:55:15 Clayton [name] ([id number]) -------------------------------------------------------------------------------- Multi Wound Chart Details Patient Name: Clayton [name], Clayton [name]. Date of Service: 05/22/2021 3:45 PM Medical Record Number: [id number] Patient Account Number: [id number] Date of Birth/Sex: May 13, 1928 (87 y.o. M) Treating RN: Levora Dredge Primary Care Keyshawna Prouse: Dorthy Cooler, Dibas Other Clinician: Cornell Barman Referring Novalyn Lajara: Dorthy Cooler, Dibas Treating Rosangela Fehrenbach/Extender: Skipper Cliche in Treatment: 4 Vital Signs Height(in):  76 Pulse(bpm): 54 Weight(lbs): 198 Blood Pressure(mmHg): 174/70 Body Mass Index(BMI): 29 Temperature(F): 97.7 Respiratory Rate(breaths/min): 18 Photos: [2:No Photos] Wound Location: Right, Lateral Ankle Right Gluteus Left, Proximal, Midline Lower Leg Wounding Event: Gradually Appeared Trauma Skin Tear/Laceration Primary Etiology: Inflammatory Pressure Ulcer Skin Tear Comorbid History: Cataracts, Anemia, Chronic Cataracts, Anemia, Chronic N/A Obstructive Pulmonary Disease Obstructive Pulmonary Disease (COPD), Sleep Apnea, Arrhythmia, (COPD), Sleep Apnea, Arrhythmia, Coronary Artery Disease, Coronary Artery Disease, Myocardial Infarction, Type II Myocardial Infarction, Type II  Diabetes, Osteomyelitis Diabetes, Osteomyelitis Date Acquired: 12/26/2020 05/16/2020 04/01/2021 Weeks of Treatment: 4 0 4 Wound Status: Open Open Healed - Epithelialized Measurements L x W x D (cm) 1.7x0.8x0.2 1.3x1.6x0.1 0x0x0 Area (cm) : 1.068 1.634 0 Volume (cm) : 0.214 0.163 0 % Reduction in Area: 20.50% N/A 100.00% % Reduction in Volume: 60.10% N/A 100.00% Classification: Full Thickness Without Exposed Category/Stage II Full Thickness Without Exposed Support Structures Support Structures Exudate Amount: Large Medium Medium Exudate Type: Serous Serosanguineous Serous Exudate Color: amber red, brown amber Wound Margin: Well defined, not attached N/A N/A Granulation Amount: Medium (34-66%) Small (1-33%) N/A Granulation Quality: Red, Hyper-granulation Pink N/A Necrotic Amount: Medium (34-66%) Medium (34-66%) N/A Necrotic Tissue: Adherent [place] N/A Exposed Structures: Fat Layer (Subcutaneous Tissue): Fat Layer (Subcutaneous Tissue): N/A Yes Yes Fascia: No Fascia: No Tendon: No Tendon: No Muscle: No Muscle: No Joint: No Joint: No Bone: No Bone: No Epithelialization: None None N/A Wound Number: [id number]  Photos: No Photos No Photos Clayton [name], Clayton [name] ([id number]) Wound Location:  Left, Midline, Posterior Lower Leg Left, Anterior Elbow Left, Posterior Elbow Wounding Event: Skin Tear/Laceration Trauma Trauma Primary Etiology: Skin Tear Trauma, Other Trauma, Other Comorbid History: N/A Cataracts, Anemia, Chronic Cataracts, Anemia, Chronic Obstructive Pulmonary Disease Obstructive Pulmonary Disease (COPD), Sleep Apnea, Arrhythmia, (COPD), Sleep Apnea, Arrhythmia, Coronary Artery Disease, Coronary Artery Disease, Myocardial Infarction, Type II Myocardial Infarction, Type II Diabetes, Osteomyelitis Diabetes, Osteomyelitis Date Acquired: 04/03/2021 05/15/2021 05/15/2021 Weeks of Treatment: 4 0 0 Wound Status: Healed - Epithelialized Open Open Measurements L x W x D (cm) 0x0x0 0.6x1.5x0.1 3.5x1.7x0.1 Area (cm) : 0 0.707 4.673 Volume (cm) : 0 0.071 0.467 % Reduction in Area: 100.00% 0.00% 0.00% % Reduction in Volume: 100.00% 0.00% 0.00% Classification: Partial Thickness Full Thickness Without Exposed Full Thickness Without Exposed Support Structures Support Structures Exudate Amount: None Present Medium Medium Exudate Type: N/A Serosanguineous Serosanguineous Exudate Color: N/A red, brown red, brown Wound Margin: N/A N/A N/A Granulation Amount: N/A Large (67-100%) Large (67-100%) Granulation Quality: N/A Red Red Necrotic Amount: N/A None Present (0%) Small (1-33%) Necrotic Tissue: N/A N/A Eschar Exposed Structures: N/A Fat Layer (Subcutaneous Tissue): Fat Layer (Subcutaneous Tissue): Yes Yes Fascia: No Fascia: No Tendon: No Tendon: No Muscle: No Muscle: No Joint: No Joint: No Bone: No Bone: No Epithelialization: N/A N/A N/A Wound Number: [id number]  Photos: Wound Location: Right, Anterior Elbow Right, Posterior Elbow Right Lower Leg Wounding Event: Trauma Trauma Trauma Primary Etiology: Trauma, Other Trauma, Other Trauma, Other Comorbid History: Cataracts, Anemia, Chronic Cataracts, Anemia, Chronic Cataracts, Anemia, Chronic Obstructive Pulmonary Disease  Obstructive Pulmonary Disease Obstructive Pulmonary Disease (COPD), Sleep Apnea, Arrhythmia, (COPD), Sleep Apnea, Arrhythmia, (COPD), Sleep Apnea, Arrhythmia, Coronary Artery Disease, Coronary Artery Disease, Coronary Artery Disease, Myocardial Infarction, Type II Myocardial Infarction, Type II Myocardial Infarction, Type II Diabetes, Osteomyelitis Diabetes, Osteomyelitis Diabetes, Osteomyelitis Date Acquired: 05/19/2021 05/19/2021 05/15/2021 Weeks of Treatment: 0 0 0 Wound Status: Open Open Open Measurements L x W x D (cm) 0.7x0.4x0.1 1x1.5x0.1 3x4.4x0.1 Area (cm) : 0.22 1.178 10.367 Volume (cm) : 0.022 0.118 1.037 % Reduction in Area: N/A 0.00% N/A % Reduction in Volume: N/A 0.00% N/A Classification: Full Thickness Without Exposed Full Thickness Without Exposed Full Thickness Without Exposed Support Structures Support Structures Support Structures Exudate Amount: Medium Medium Medium Exudate Type: Serosanguineous Serosanguineous Serosanguineous Exudate Color: red, brown red, brown red, brown Wound Margin: N/A N/A N/A Clayton [name], Clayton [name] ([id number]) Granulation Amount: Large (67-100%) Large (67-100%) Medium (34-66%) Granulation Quality: Red Red Red Necrotic  Amount: Small (1-33%) Small (1-33%) Medium (34-66%) Necrotic Tissue: Eschar Eschar, Adherent [place] Exposed Structures: Fat Layer (Subcutaneous Tissue): Fat Layer (Subcutaneous Tissue): Fat Layer (Subcutaneous Tissue): Yes Yes Yes Fascia: No Fascia: No Fascia: No Tendon: No Tendon: No Tendon: No Muscle: No Muscle: No Muscle: No Joint: No Joint: No Joint: No Bone: No Bone: No Bone: No Epithelialization: None N/A None Treatment Notes Wound #1 (Ankle) Wound Laterality: Right, Lateral Cleanser Soap and Water Discharge Instruction: Gently cleanse wound with antibacterial soap, rinse and pat dry prior to dressing wounds Wound Cleanser Discharge Instruction: Wash your hands with soap and water.  Remove old dressing, discard into plastic bag and place into trash. Cleanse the wound with Wound Cleanser prior to applying a clean dressing using gauze sponges, not tissues or cotton balls. Do not scrub or use excessive force. Pat dry using gauze sponges, not tissue or cotton balls. Peri-Wound Care Topical Primary Dressing Prisma 4.34 (in) Discharge Instruction: APPLY TO WOUND BED-Moisten w/normal saline or sterile water; Cover wound as directed. Do not remove from wound bed. Secondary Dressing Zetuvit Plus Silicone Border Dressing 4x4 (in/in) Secured With Compression Wrap Compression Stockings Add-Ons Wound #10 (Gluteus) Wound Laterality: Right Cleanser Peri-Wound Care Topical Primary Dressing Xeroform-HBD 2x2 (in/in) Discharge Instruction: Apply Xeroform-HBD 2x2 (in/in) as directed Secondary Dressing Zetuvit Plus Silicone Border Dressing 5x5 (in/in) Secured With Compression Wrap Compression Stockings Add-Ons Wound #2 (Lower Leg) Wound Laterality: Left, Midline, Proximal OSAMA, COLESON ([id number]) Cleanser Peri-Wound Care Topical Primary Dressing Secondary Dressing Secured With Compression Wrap Compression Stockings Add-Ons Wound #3 (Lower Leg) Wound Laterality: Left, Midline, Posterior Cleanser Peri-Wound Care Topical Primary Dressing Secondary Dressing Secured With Compression Wrap Compression Stockings Add-Ons Wound #5 (Elbow) Wound Laterality: Left, Anterior Cleanser Peri-Wound Care Topical Primary Dressing Xeroform-HBD 2x2 (in/in) Discharge Instruction: Apply Xeroform-HBD 2x2 (in/in) as directed Secondary Dressing Zetuvit Plus Silicone Border Dressing 5x5 (in/in) Secured With Compression Wrap Compression Stockings Add-Ons Wound #6 (Elbow) Wound Laterality: Left, Posterior Cleanser Peri-Wound Care Topical Primary Dressing Xeroform-HBD 2x2 (in/in) Discharge Instruction: Apply Xeroform-HBD 2x2 (in/in) as directed ALQUAN, MORRISH  ([id number]) Secondary Dressing Zetuvit Plus Silicone Border Dressing 5x5 (in/in) Secured With Compression Wrap Compression Stockings Add-Ons Wound #7 (Elbow) Wound Laterality: Right, Anterior Cleanser Peri-Wound Care Topical Primary Dressing Xeroform-HBD 2x2 (in/in) Discharge Instruction: Apply Xeroform-HBD 2x2 (in/in) as directed Secondary Dressing Zetuvit Plus Silicone Border Dressing 5x5 (in/in) Secured With Compression Wrap Compression Stockings Add-Ons Wound #8 (Elbow) Wound Laterality: Right, Posterior Cleanser Peri-Wound Care Topical Primary Dressing Xeroform-HBD 2x2 (in/in) Discharge Instruction: Apply Xeroform-HBD 2x2 (in/in) as directed Secondary Dressing Zetuvit Plus Silicone Border Dressing 5x5 (in/in) Secured With Compression Wrap Compression Stockings Add-Ons Wound #9 (Lower Leg) Wound Laterality: Right Cleanser Peri-Wound Care Topical Primary Dressing Xeroform-HBD 2x2 (in/in) Discharge Instruction: Apply Xeroform-HBD 2x2 (in/in) as directed Secondary Dressing Zetuvit Plus Silicone Border Dressing 5x5 (in/in) JIGAR, ZIELKE ([id number]) Secured With Compression Wrap Compression Stockings Environmental education officer) Signed: 05/22/2021 5:05:24 PM By: Gretta Cool, BSN, RN, CWS, Kim RN, BSN Previous Signature: 05/22/2021 4:56:03 PM Version By: Gretta Cool, BSN, RN, CWS, Kim RN, BSN Entered By: Gretta Cool, BSN, RN, CWS, Kim on 05/22/2021 17:05:24 Clayton [name] ([id number]) -------------------------------------------------------------------------------- [place] Details Patient Name: ZAYVIEN, CANNING. Date of Service: 05/22/2021 3:45 PM Medical Record Number: [id number] Patient Account Number: [id number] Date of Birth/Sex: 28-Dec-1927 (87 y.o. M) Treating RN: Levora Dredge Primary Care Ivelis Norgard: Dorthy Cooler, Dibas Other Clinician: Cornell Barman Referring Tomi Grandpre: Dorthy Cooler, Dibas Treating [place] Mcphee/Extender: Skipper Cliche in Treatment:  4  Active Inactive Necrotic Tissue Nursing Diagnoses: Impaired tissue integrity related to necrotic/devitalized tissue Knowledge deficit related to management of necrotic/devitalized tissue Goals: Necrotic/devitalized tissue will be minimized in the wound bed Date Initiated: 04/24/2021 Target Resolution Date: 05/24/2021 Goal Status: Active Patient/caregiver will verbalize understanding of reason and process for debridement of necrotic tissue Date Initiated: 04/24/2021 Date Inactivated: 05/08/2021 Target Resolution Date: 05/24/2021 Goal Status: Met Interventions: Assess patient pain level pre-, during and post procedure and prior to discharge Provide education on necrotic tissue and debridement process Treatment Activities: Apply topical anesthetic as ordered : 04/24/2021 Excisional debridement : 04/24/2021 Notes: Soft Tissue Infection Nursing Diagnoses: Impaired tissue integrity Knowledge deficit related to disease process and management Knowledge deficit related to home infection control: handwashing, handling of soiled dressings, supply storage Potential for infection: soft tissue Goals: Signs and symptoms of infection will be recognized early to allow for prompt treatment Date Initiated: 04/24/2021 Target Resolution Date: 05/25/2021 Goal Status: Active Interventions: Assess signs and symptoms of infection every visit Notes: Wound/Skin Impairment Nursing Diagnoses: Impaired tissue integrity Goals: Patient/caregiver will verbalize understanding of skin care regimen Date Initiated: 04/24/2021 Date Inactivated: 05/08/2021 Target Resolution Date: 04/24/2021 Goal Status: Met Ulcer/skin breakdown will have a volume reduction of 30% by week 4 Date Initiated: 04/24/2021 Target Resolution Date: 05/24/2021 Goal Status: Active Clayton [name], Clayton [name] ([id number]) Interventions: Assess ulceration(s) every visit Treatment Activities: Skin care regimen initiated : 04/24/2021 Topical wound  management initiated : 04/24/2021 Notes: Electronic Signature(s) Signed: 05/22/2021 5:04:59 PM By: Gretta Cool, BSN, RN, CWS, Kim RN, BSN Signed: 05/23/2021 11:58:41 AM By: Levora Dredge Previous Signature: 05/22/2021 4:55:24 PM Version By: Gretta Cool, BSN, RN, CWS, Kim RN, BSN Previous Signature: 05/22/2021 4:53:54 PM Version By: Levora Dredge Entered By: Gretta Cool BSN, RN, CWS, Kim on 05/22/2021 17:04:59 Clayton [name] ([id number]) -------------------------------------------------------------------------------- Pain Assessment Details Patient Name: Clayton [name], Clayton [name]. Date of Service: 05/22/2021 3:45 PM Medical Record Number: [id number] Patient Account Number: [id number] Date of Birth/Sex: 05-09-1928 (87 y.o. M) Treating RN: Levora Dredge Primary Care Lucyann Romano: Dorthy Cooler, Dibas Other Clinician: Referring Lavenia Stumpo: Dorthy Cooler, Dibas Treating Kutter Schnepf/Extender: Skipper Cliche in Treatment: 4 Active Problems Location of Pain Severity and Description of Pain Patient Has Paino Yes Site Locations Pain Location: Generalized Pain Rate the pain. Current Pain Level: 0 Pain Management and Medication Current Pain Management: Notes Patient does not complain of pain, but when turning the patient onto his left side he complained of severe hip pain. Electronic Signature(s) Signed: 05/22/2021 4:53:17 PM By: Gretta Cool, BSN, RN, CWS, Kim RN, BSN Signed: 05/23/2021 11:58:41 AM By: Levora Dredge Previous Signature: 05/22/2021 4:50:59 PM Version By: Gretta Cool, BSN, RN, CWS, Kim RN, BSN Entered By: Gretta Cool, BSN, RN, CWS, Kim on 05/22/2021 16:53:17 Clayton [name] ([id number]) -------------------------------------------------------------------------------- Patient/Caregiver Education Details Patient Name: Clayton [name], Clayton [name] Date of Service: 05/22/2021 3:45 PM Medical Record Number: [id number] Patient Account Number: [id number] Date of Birth/Gender: 11-18-1927 (87 y.o. M) Treating RN: Levora Dredge Primary Care Physician:  Dorthy Cooler, Dibas Other Clinician: Cornell Barman Referring Physician: Dorthy Cooler, Dibas Treating Physician/Extender: Skipper Cliche in Treatment: 4 Education Assessment Education Provided To: Patient Education Topics Provided Wound/Skin Impairment: Handouts: Caring for Your Ulcer Methods: Explain/Verbal Responses: State content correctly Electronic Signature(s) Signed: 05/22/2021 5:23:39 PM By: Gretta Cool, BSN, RN, CWS, Kim RN, BSN Entered By: Gretta Cool, BSN, RN, CWS, Kim on 05/22/2021 17:08:37 Clayton [name] ([id number]) -------------------------------------------------------------------------------- Wound Assessment Details Patient Name: Clayton [name] Date of Service: 05/22/2021 3:45 PM Medical Record Number: [id number] Patient Account Number: [id number] Date of Birth/Sex: 04/04/1928 (87 y.o.  M) Treating RN: Levora Dredge Primary Care Tylah Mancillas: Dorthy Cooler, Dibas Other Clinician: Referring Rafal Archuleta: Dorthy Cooler, Dibas Treating Louay Myrie/Extender: Skipper Cliche in Treatment: 4 Wound Status Wound Number: 1 Primary Inflammatory Etiology: Wound Location: Right, Lateral Ankle Wound Open Wounding Event: Gradually Appeared Status: Date Acquired: 12/26/2020 Comorbid Cataracts, Anemia, Chronic Obstructive Pulmonary Disease Weeks Of Treatment: 4 History: (COPD), Sleep Apnea, Arrhythmia, Coronary Artery Clustered Wound: No Disease, Myocardial Infarction, Type II Diabetes, Osteomyelitis Photos Wound Measurements Length: (cm) 1.7 Width: (cm) 0.8 Depth: (cm) 0.2 Area: (cm) 1.068 Volume: (cm) 0.214 % Reduction in Area: 20.5% % Reduction in Volume: 60.1% Epithelialization: None Tunneling: No Undermining: No Wound Description Classification: Full Thickness Without Exposed Support Structu Wound Margin: Well defined, not attached Exudate Amount: Large Exudate Type: Serous Exudate Color: amber res Foul Odor After Cleansing: No Slough/Fibrino Yes Wound Bed Granulation Amount: Medium  (34-66%) Exposed Structure Granulation Quality: Red, Hyper-granulation Fascia Exposed: No Necrotic Amount: Medium (34-66%) Fat Layer (Subcutaneous Tissue) Exposed: Yes Necrotic Quality: Adherent [place] Tendon Exposed: No Muscle Exposed: No Joint Exposed: No Bone Exposed: No Treatment Notes Wound #1 (Ankle) Wound Laterality: Right, Lateral Cleanser Soap and Water Discharge Instruction: Gently cleanse wound with antibacterial soap, rinse and pat dry prior to dressing wounds Clayton [name], FLOTT ([id number]) Wound Cleanser Discharge Instruction: Wash your hands with soap and water. Remove old dressing, discard into plastic bag and place into trash. Cleanse the wound with Wound Cleanser prior to applying a clean dressing using gauze sponges, not tissues or cotton balls. Do not scrub or use excessive force. Pat dry using gauze sponges, not tissue or cotton balls. Peri-Wound Care Topical Primary Dressing Hydrofera Blue Ready Transfer Foam, 2.5x2.5 (in/in) Discharge Instruction: Apply Hydrofera Blue Ready to wound bed as directed Secondary Dressing Zetuvit Plus Silicone Border Dressing 4x4 (in/in) Secured With Compression Wrap Compression Stockings Add-Ons Electronic Signature(s) Signed: 05/23/2021 11:58:41 AM By: Levora Dredge Entered By: Levora Dredge on 05/22/2021 16:04:55 Clayton [name] ([id number]) -------------------------------------------------------------------------------- Wound Assessment Details Patient Name: Clayton [name]. Date of Service: 05/22/2021 3:45 PM Medical Record Number: [id number] Patient Account Number: [id number] Date of Birth/Sex: [birth year] (87 y.o. M) Treating RN: Levora Dredge Primary Care Atlas Kuc: Dorthy Cooler, Dibas Other Clinician: Levora Dredge Referring Tish Begin: Dorthy Cooler, Dibas Treating Fantasha Daniele/Extender: Skipper Cliche in Treatment: 4 Wound Status Wound Number: 10 Primary Pressure Ulcer Etiology: Wound Location: Right Gluteus Wound  Open Wounding Event: Trauma Status: Date Acquired: 05/16/2020 Comorbid Cataracts, Anemia, Chronic Obstructive Pulmonary Disease Weeks Of Treatment: 0 History: (COPD), Sleep Apnea, Arrhythmia, Coronary Artery Clustered Wound: No Disease, Myocardial Infarction, Type II Diabetes, Osteomyelitis Photos Wound Measurements Length: (cm) 1.3 % Redu Width: (cm) 1.6 % Redu Depth: (cm) 0.1 Epithe Area: (cm) 1.634 Tunne Volume: (cm) 0.163 Under ction in Area: ction in Volume: lialization: None ling: No mining: No Wound Description Classification: Category/Stage II Foul O Exudate Amount: Medium Slough Exudate Type: Serosanguineous Exudate Color: red, brown dor After Cleansing: No /Fibrino Yes Wound Bed Granulation Amount: Small (1-33%) Exposed Structure Granulation Quality: Pink Fascia Exposed: No Necrotic Amount: Medium (34-66%) Fat Layer (Subcutaneous Tissue) Exposed: Yes Necrotic Quality: Adherent Slough Tendon Exposed: No Muscle Exposed: No Joint Exposed: No Bone Exposed: No Treatment Notes Wound #10 (Gluteus) Wound Laterality: Right Cleanser Peri-Wound Care Topical IANN, RODIER ([id number]) Primary Dressing Xeroform-HBD 2x2 (in/in) Discharge Instruction: Apply Xeroform-HBD 2x2 (in/in) as directed Secondary Dressing Zetuvit Plus Silicone Border Dressing 5x5 (in/in) Secured With Compression Wrap Compression Stockings Add-Ons Electronic Signature(s) Signed: 05/23/2021 11:58:41 AM By: Levora Dredge Entered By: Levora Dredge on  05/22/2021 16:21:29 KIMOTHY, KISHIMOTO ([id number]) -------------------------------------------------------------------------------- Wound Assessment Details Patient Name: PRECILIANO, CASTELL. Date of Service: 05/22/2021 3:45 PM Medical Record Number: [id number] Patient Account Number: [id number] Date of Birth/Sex: 20-Dec-1927 (87 y.o. M) Treating RN: Levora Dredge Primary Care Levy Wellman: Dorthy Cooler, Dibas Other Clinician: Referring Caytlin Better:  Dorthy Cooler, Dibas Treating Luv Mish/Extender: Skipper Cliche in Treatment: 4 Wound Status Wound Number: 2 Primary Etiology: Skin Tear Wound Location: Left, Proximal, Midline Lower Leg Wound Status: Healed - Epithelialized Wounding Event: Skin Tear/Laceration Date Acquired: 04/01/2021 Weeks Of Treatment: 4 Clustered Wound: No Wound Measurements Length: (cm) 0 Width: (cm) 0 Depth: (cm) 0 Area: (cm) Volume: (cm) % Reduction in Area: 100% % Reduction in Volume: 100% 0 0 Wound Description Classification: Full Thickness Without Exposed Support Structu Exudate Amount: Medium Exudate Type: Serous Exudate Color: amber res Treatment Notes Wound #2 (Lower Leg) Wound Laterality: Left, Midline, Proximal Cleanser Peri-Wound Care Topical Primary Dressing Secondary Dressing Secured With Compression Wrap Compression Stockings Add-Ons Electronic Signature(s) Signed: 05/23/2021 11:58:41 AM By: Levora Dredge Entered By: Levora Dredge on 05/22/2021 16:02:04 Clayton [name] ([id number]) -------------------------------------------------------------------------------- Wound Assessment Details Patient Name: Clayton [name]. Date of Service: 05/22/2021 3:45 PM Medical Record Number: [id number] Patient Account Number: [id number] Date of Birth/Sex: 05-May-1928 (87 y.o. M) Treating RN: Levora Dredge Primary Care Xian Alves: Dorthy Cooler, Dibas Other Clinician: Referring Krista Godsil: Dorthy Cooler, Dibas Treating Darleene Cumpian/Extender: Skipper Cliche in Treatment: 4 Wound Status Wound Number: 3 Primary Etiology: Skin Tear Wound Location: Left, Midline, Posterior Lower Leg Wound Status: Healed - Epithelialized Wounding Event: Skin Tear/Laceration Date Acquired: 04/03/2021 Weeks Of Treatment: 4 Clustered Wound: No Wound Measurements Length: (cm) 0 Width: (cm) 0 Depth: (cm) 0 Area: (cm) 0 Volume: (cm) 0 % Reduction in Area: 100% % Reduction in Volume: 100% Wound  Description Classification: Partial Thickness Exudate Amount: None Present Treatment Notes Wound #3 (Lower Leg) Wound Laterality: Left, Midline, Posterior Cleanser Peri-Wound Care Topical Primary Dressing Secondary Dressing Secured With Compression Wrap Compression Stockings Add-Ons Electronic Signature(s) Signed: 05/23/2021 11:58:41 AM By: Levora Dredge Entered By: Levora Dredge on 05/22/2021 16:02:04 Clayton [name] ([id number]) -------------------------------------------------------------------------------- Wound Assessment Details Patient Name: Clayton [name]. Date of Service: 05/22/2021 3:45 PM Medical Record Number: [id number] Patient Account Number: [id number] Date of Birth/Sex: 1928/03/15 (87 y.o. M) Treating RN: Levora Dredge Primary Care Kateleen Encarnacion: Dorthy Cooler, Dibas Other Clinician: Referring Sondos Wolfman: Dorthy Cooler, Dibas Treating Westyn Driggers/Extender: Skipper Cliche in Treatment: 4 Wound Status Wound Number: 5 Primary Trauma, Other Etiology: Wound Location: Left, Anterior Elbow Wound Open Wounding Event: Trauma Status: Date Acquired: 05/15/2021 Comorbid Cataracts, Anemia, Chronic Obstructive Pulmonary Disease Weeks Of Treatment: 0 History: (COPD), Sleep Apnea, Arrhythmia, Coronary Artery Clustered Wound: No Disease, Myocardial Infarction, Type II Diabetes, Osteomyelitis Wound Measurements Length: (cm) 0.6 Width: (cm) 1.5 Depth: (cm) 0.1 Area: (cm) 0.707 Volume: (cm) 0.071 % Reduction in Area: 0% % Reduction in Volume: 0% Wound Description Classification: Full Thickness Without Exposed Support Structu Exudate Amount: Medium Exudate Type: Serosanguineous Exudate Color: red, brown res Foul Odor After Cleansing: No Slough/Fibrino No Wound Bed Granulation Amount: Large (67-100%) Exposed Structure Granulation Quality: Red Fascia Exposed: No Necrotic Amount: None Present (0%) Fat Layer (Subcutaneous Tissue) Exposed: Yes Tendon Exposed: No Muscle  Exposed: No Joint Exposed: No Bone Exposed: No Treatment Notes Wound #5 (Elbow) Wound Laterality: Left, Anterior Cleanser Peri-Wound Care Topical Primary Dressing Xeroform-HBD 2x2 (in/in) Discharge Instruction: Apply Xeroform-HBD 2x2 (in/in) as directed Secondary Dressing Zetuvit Plus Silicone Border Dressing 5x5 (in/in) Secured With Compression Wrap Compression Stockings Add-Ons Electronic Signature(s) [name]  Johnette Abraham ([id number]) Signed: 05/23/2021 11:58:41 AM By: Levora Dredge Entered By: Levora Dredge on 05/22/2021 16:09:03 Clayton [name] ([id number]) -------------------------------------------------------------------------------- Wound Assessment Details Patient Name: HUTTON, PELLICANE. Date of Service: 05/22/2021 3:45 PM Medical Record Number: [id number] Patient Account Number: [id number] Date of Birth/Sex: 04/07/1928 (87 y.o. M) Treating RN: Levora Dredge Primary Care Evalee Gerard: Dorthy Cooler, Dibas Other Clinician: Referring Jahzaria Vary: Dorthy Cooler, Dibas Treating Kam Rahimi/Extender: Skipper Cliche in Treatment: 4 Wound Status Wound Number: 6 Primary Trauma, Other Etiology: Wound Location: Left, Posterior Elbow Wound Open Wounding Event: Trauma Status: Date Acquired: 05/15/2021 Comorbid Cataracts, Anemia, Chronic Obstructive Pulmonary Disease Weeks Of Treatment: 0 History: (COPD), Sleep Apnea, Arrhythmia, Coronary Artery Clustered Wound: No Disease, Myocardial Infarction, Type II Diabetes, Osteomyelitis Photos Wound Measurements Length: (cm) 3.5 Width: (cm) 1.7 Depth: (cm) 0.1 Area: (cm) 4.673 Volume: (cm) 0.467 % Reduction in Area: 0% % Reduction in Volume: 0% Wound Description Classification: Full Thickness Without Exposed Support Structu Exudate Amount: Medium Exudate Type: Serosanguineous Exudate Color: red, brown res Foul Odor After Cleansing: No Slough/Fibrino No Wound Bed Granulation Amount: Large (67-100%) Exposed Structure Granulation  Quality: Red Fascia Exposed: No Necrotic Amount: Small (1-33%) Fat Layer (Subcutaneous Tissue) Exposed: Yes Necrotic Quality: Eschar Tendon Exposed: No Muscle Exposed: No Joint Exposed: No Bone Exposed: No Treatment Notes Wound #6 (Elbow) Wound Laterality: Left, Posterior Cleanser Peri-Wound Care Topical JOSIYAH, TOZZI ([id number]) Primary Dressing Xeroform-HBD 2x2 (in/in) Discharge Instruction: Apply Xeroform-HBD 2x2 (in/in) as directed Secondary Dressing Zetuvit Plus Silicone Border Dressing 5x5 (in/in) Secured With Compression Wrap Compression Stockings Add-Ons Electronic Signature(s) Signed: 05/23/2021 11:58:41 AM By: Levora Dredge Entered By: Levora Dredge on 05/22/2021 16:13:23 Clayton [name] ([id number]) -------------------------------------------------------------------------------- Wound Assessment Details Patient Name: Clayton [name]. Date of Service: 05/22/2021 3:45 PM Medical Record Number: [id number] Patient Account Number: [id number] Date of Birth/Sex: [birth year] (87 y.o. M) Treating RN: Levora Dredge Primary Care Divya Munshi: Dorthy Cooler, Dibas Other Clinician: Referring Virgin Zellers: Dorthy Cooler, Dibas Treating Jaxsyn Catalfamo/Extender: Skipper Cliche in Treatment: 4 Wound Status Wound Number: 7 Primary Trauma, Other Etiology: Wound Location: Right, Anterior Elbow Wound Open Wounding Event: Trauma Status: Date Acquired: 05/19/2021 Comorbid Cataracts, Anemia, Chronic Obstructive Pulmonary Disease Weeks Of Treatment: 0 History: (COPD), Sleep Apnea, Arrhythmia, Coronary Artery Clustered Wound: No Disease, Myocardial Infarction, Type II Diabetes, Osteomyelitis Photos Wound Measurements Length: (cm) 0.7 Width: (cm) 0.4 Depth: (cm) 0.1 Area: (cm) 0.22 Volume: (cm) 0.022 % Reduction in Area: % Reduction in Volume: Epithelialization: None Tunneling: No Undermining: No Wound Description Classification: Full Thickness Without Exposed Support  Structu Exudate Amount: Medium Exudate Type: Serosanguineous Exudate Color: red, brown res Foul Odor After Cleansing: No Slough/Fibrino No Wound Bed Granulation Amount: Large (67-100%) Exposed Structure Granulation Quality: Red Fascia Exposed: No Necrotic Amount: Small (1-33%) Fat Layer (Subcutaneous Tissue) Exposed: Yes Necrotic Quality: Eschar Tendon Exposed: No Muscle Exposed: No Joint Exposed: No Bone Exposed: No Treatment Notes Wound #7 (Elbow) Wound Laterality: Right, Anterior Cleanser Peri-Wound Care Topical WAVERLY, CHAVARRIA ([id number]) Primary Dressing Xeroform-HBD 2x2 (in/in) Discharge Instruction: Apply Xeroform-HBD 2x2 (in/in) as directed Secondary Dressing Zetuvit Plus Silicone Border Dressing 5x5 (in/in) Secured With Compression Wrap Compression Stockings Add-Ons Electronic Signature(s) Signed: 05/23/2021 11:58:41 AM By: Levora Dredge Entered By: Levora Dredge on 05/22/2021 16:15:51 Clayton [name] ([id number]) -------------------------------------------------------------------------------- Wound Assessment Details Patient Name: Clayton [name]. Date of Service: 05/22/2021 3:45 PM Medical Record Number: [id number] Patient Account Number: [id number] Date of Birth/Sex: [birth year] (87 y.o. M) Treating RN: Levora Dredge Primary Care Lamia Mariner: Dorthy Cooler, Dibas Other Clinician: Levora Dredge Referring Caroline Longie: Dorthy Cooler,  Dibas Treating Dayanis Bergquist/Extender: Jeri Cos Weeks in Treatment: 4 Wound Status Wound Number: 8 Primary Trauma, Other Etiology: Wound Location: Right, Posterior Elbow Wound Open Wounding Event: Trauma Status: Date Acquired: 05/19/2021 Comorbid Cataracts, Anemia, Chronic Obstructive Pulmonary Disease Weeks Of Treatment: 0 History: (COPD), Sleep Apnea, Arrhythmia, Coronary Artery Clustered Wound: No Disease, Myocardial Infarction, Type II Diabetes, Osteomyelitis Photos Wound Measurements Length: (cm) 1 Width: (cm) 1.5 Depth:  (cm) 0.1 Area: (cm) 1.178 Volume: (cm) 0.118 % Reduction in Area: 0% % Reduction in Volume: 0% Wound Description Classification: Full Thickness Without Exposed Support Structu Exudate Amount: Medium Exudate Type: Serosanguineous Exudate Color: red, brown res Slough/Fibrino No Wound Bed Granulation Amount: Large (67-100%) Exposed Structure Granulation Quality: Red Fascia Exposed: No Necrotic Amount: Small (1-33%) Fat Layer (Subcutaneous Tissue) Exposed: Yes Necrotic Quality: Eschar, Adherent Slough Tendon Exposed: No Muscle Exposed: No Joint Exposed: No Bone Exposed: No Treatment Notes Wound #8 (Elbow) Wound Laterality: Right, Posterior Cleanser Peri-Wound Care Topical BAUER, AUSBORN ([id number]) Primary Dressing Xeroform-HBD 2x2 (in/in) Discharge Instruction: Apply Xeroform-HBD 2x2 (in/in) as directed Secondary Dressing Zetuvit Plus Silicone Border Dressing 5x5 (in/in) Secured With Compression Wrap Compression Stockings Add-Ons Electronic Signature(s) Signed: 05/23/2021 11:58:41 AM By: Levora Dredge Entered By: Levora Dredge on 05/22/2021 16:17:42 Clayton [name] ([id number]) -------------------------------------------------------------------------------- Wound Assessment Details Patient Name: Clayton [name]. Date of Service: 05/22/2021 3:45 PM Medical Record Number: [id number] Patient Account Number: [id number] Date of Birth/Sex: 1928-01-17 (87 y.o. M) Treating RN: Levora Dredge Primary Care Allon Costlow: Dorthy Cooler, Dibas Other Clinician: Levora Dredge Referring Taniya Dasher: Dorthy Cooler, Dibas Treating Larnie Heart/Extender: Skipper Cliche in Treatment: 4 Wound Status Wound Number: 9 Primary Trauma, Other Etiology: Wound Location: Right Lower Leg Wound Open Wounding Event: Trauma Status: Date Acquired: 05/15/2021 Comorbid Cataracts, Anemia, Chronic Obstructive Pulmonary Disease Weeks Of Treatment: 0 History: (COPD), Sleep Apnea, Arrhythmia, Coronary  Artery Clustered Wound: No Disease, Myocardial Infarction, Type II Diabetes, Osteomyelitis Photos Wound Measurements Length: (cm) 3 Width: (cm) 4.4 Depth: (cm) 0.1 Area: (cm) 10.367 Volume: (cm) 1.037 % Reduction in Area: % Reduction in Volume: Epithelialization: None Tunneling: No Undermining: No Wound Description Classification: Full Thickness Without Exposed Support Structu Exudate Amount: Medium Exudate Type: Serosanguineous Exudate Color: red, brown res Foul Odor After Cleansing: No Slough/Fibrino Yes Wound Bed Granulation Amount: Medium (34-66%) Exposed Structure Granulation Quality: Red Fascia Exposed: No Necrotic Amount: Medium (34-66%) Fat Layer (Subcutaneous Tissue) Exposed: Yes Necrotic Quality: Eschar, Adherent Slough Tendon Exposed: No Muscle Exposed: No Joint Exposed: No Bone Exposed: No Treatment Notes Wound #9 (Lower Leg) Wound Laterality: Right Cleanser Peri-Wound Care Topical FRAZIER, BALFOUR ([id number]) Primary Dressing Xeroform-HBD 2x2 (in/in) Discharge Instruction: Apply Xeroform-HBD 2x2 (in/in) as directed Secondary Dressing Zetuvit Plus Silicone Border Dressing 5x5 (in/in) Secured With Compression Wrap Compression Stockings Add-Ons Electronic Signature(s) Signed: 05/23/2021 11:58:41 AM By: Levora Dredge Entered By: Levora Dredge on 05/22/2021 16:19:37 Clayton [name] ([id number]) -------------------------------------------------------------------------------- Vitals Details Patient Name: Clayton [name]. Date of Service: 05/22/2021 3:45 PM Medical Record Number: [id number] Patient Account Number: [id number] Date of Birth/Sex: 07/15/1927 (87 y.o. M) Treating RN: Levora Dredge Primary Care Chelsye Suhre: Dorthy Cooler, Dibas Other Clinician: Referring Linzie Criss: Dorthy Cooler, Dibas Treating Marguriete Wootan/Extender: Skipper Cliche in Treatment: 4 Vital Signs Time Taken: 15:48 Temperature (F): 97.7 Height (in): 69 Pulse (bpm): 82 Weight  (lbs): 198 Respiratory Rate (breaths/min): 18 Body Mass Index (BMI): 29.2 Blood Pressure (mmHg): 174/70 Reference Range: 80 - 120 mg / dl Electronic Signature(s) Signed: 05/22/2021 4:50:52 PM By: Gretta Cool, BSN, RN, CWS, Kim RN, BSN Entered By: Gretta Cool, BSN,  RN, Marcy Siren on 05/22/2021 16:50:52

## 2021-05-25 ENCOUNTER — Encounter: Payer: Self-pay | Admitting: Hematology and Oncology

## 2021-05-25 NOTE — Telephone Encounter (Signed)
Patient's creatinine was elevated to 4.44 on 05/23/2021. Electrolytes are within normal levels. He is an established patient of Dr. Izell . Patient is stable without any new symptoms including nausea, vomiting, diarrhea or decreased PO intake. We have requested an urgent follow up with Dr. Marylou Flesher and is scheduled for 05/29/2021.

## 2021-06-03 ENCOUNTER — Encounter: Payer: Self-pay | Admitting: Hematology and Oncology

## 2021-06-05 ENCOUNTER — Other Ambulatory Visit: Payer: Self-pay

## 2021-06-05 ENCOUNTER — Encounter: Payer: HMO | Admitting: Physician Assistant

## 2021-06-05 DIAGNOSIS — E11622 Type 2 diabetes mellitus with other skin ulcer: Secondary | ICD-10-CM | POA: Diagnosis not present

## 2021-06-06 NOTE — Progress Notes (Addendum)
RAVINDER, LUKEHART ([id number]) Visit Report for 06/05/2021 Arrival Information Details Patient Name: Clayton [name], Clayton [name]. Date of Service: 06/05/2021 3:45 PM Medical Record Number: [id number] Patient Account Number: [id number] Date of Birth/Sex: 22-Jun-1927 (87 y.o. M) Treating RN: Donnamarie Poag Primary Care Jadarian Mckay: Dorthy Cooler, Dibas Other Clinician: Referring Edris Friedt: Dorthy Cooler, Dibas Treating Jeree Delcid/Extender: Skipper Cliche in Treatment: 6 Visit Information History Since Last Visit Added or deleted any medications: No Patient Arrived: Wheel Chair Had a fall or experienced change in No Arrival Time: 16:02 activities of daily living that may affect Accompanied By: daughter risk of falls: Transfer Assistance: None Hospitalized since last visit: No Patient Identification Verified: Yes Has Dressing in Place as Prescribed: Yes Secondary Verification Process Yes Pain Present Now: No Completed: Patient Requires Transmission-Based No Precautions: Patient Has Alerts: Yes Patient Alerts: Patient on Blood Thinner Type II Diabetic 40m aspirin Non-compressible >220 Lives [facility] @[facility] Electronic Signature(s) Signed: 06/05/2021 4:31:08 PM By: BDonnamarie PoagEntered By: BDonnamarie Poagon 06/05/2021 16:03:13 Clayton [name]([id number] -------------------------------------------------------------------------------- Clinic Level of Care Assessment Details Patient Name: Clayton [name] Date of Service: 06/05/2021 3:45 PM Medical Record Number: [id number] Account Number: [id number] of Birth/Sex: [birth year](87y.o. M) Treating RN: BDonnamarie PoagPrimary Care Kavir Savoca: KDorthy Cooler Dibas Other Clinician: Referring Chevy Virgo: KDorthy Cooler Dibas Treating Geraldine Tesar/Extender: SSkipper Clichein Treatment: 6 Clinic Level of Care Assessment Items TOOL 4 Quantity Score []  - Use when only an EandM is performed on FOLLOW-UP visit 0 ASSESSMENTS - Nursing Assessment / Reassessment X -  Reassessment of Co-morbidities (includes updates in patient status) 1 10 X- 1 5 Reassessment of Adherence to Treatment Plan ASSESSMENTS - Wound and Skin Assessment / Reassessment []  - Simple Wound Assessment / Reassessment - one wound 0 X- 3 5 Complex Wound Assessment / Reassessment - multiple wounds []  - 0 Dermatologic / Skin Assessment (not related to wound area) ASSESSMENTS - Focused Assessment []  - Circumferential Edema Measurements - multi extremities 0 []  - 0 Nutritional Assessment / Counseling / Intervention []  - 0 Lower Extremity Assessment (monofilament, tuning fork, pulses) []  - 0 Peripheral Arterial Disease Assessment (using hand held doppler) ASSESSMENTS - Ostomy and/or Continence Assessment and Care []  - Incontinence Assessment and Management 0 []  - 0 Ostomy Care Assessment and Management (repouching, etc.) PROCESS - Coordination of Care X - Simple Patient / Family Education for ongoing care 1 15 []  - 0 Complex (extensive) Patient / Family Education for ongoing care X- 1 10 Staff obtains CProgrammer, systems Records, Test Results / Process Orders []  - 0 Staff telephones HHA, Nursing Homes / Clarify orders / etc []  - 0 Routine Transfer to another Facility (non-emergent condition) []  - 0 Routine Hospital Admission (non-emergent condition) []  - 0 New Admissions / IBiomedical engineer/ Ordering NPWT, Apligraf, etc. []  - 0 Emergency Hospital Admission (emergent condition) X- 1 10 Simple Discharge Coordination []  - 0 Complex (extensive) Discharge Coordination PROCESS - Special Needs []  - Pediatric / Minor Patient Management 0 []  - 0 Isolation Patient Management []  - 0 Hearing / Language / Visual special needs []  - 0 Assessment of Community assistance (transportation, D/C planning, etc.) []  - 0 Additional assistance / Altered mentation []  - 0 Support Surface(s) Assessment (bed, cushion, seat, etc.) INTERVENTIONS - Wound Cleansing / Measurement RMONTEY, EBEL ([id number] []  - 0 Simple Wound Cleansing - one wound X- 3 5 Complex Wound Cleansing - multiple wounds X- 1 5 Wound Imaging (photographs - any number of wounds) []  - 0 Wound Tracing (instead  of photographs) []  - 0 Simple Wound Measurement - one wound X- 3 5 Complex Wound Measurement - multiple wounds INTERVENTIONS - Wound Dressings []  - Small Wound Dressing one or multiple wounds 0 []  - 0 Medium Wound Dressing one or multiple wounds X- 3 20 Large Wound Dressing one or multiple wounds []  - 0 Application of Medications - topical []  - 0 Application of Medications - injection INTERVENTIONS - Miscellaneous []  - External ear exam 0 []  - 0 Specimen Collection (cultures, biopsies, blood, body fluids, etc.) []  - 0 Specimen(s) / Culture(s) sent or taken to Lab for analysis []  - 0 Patient Transfer (multiple staff / Civil Service fast streamer / Similar devices) []  - 0 Simple Staple / Suture removal (25 or less) []  - 0 Complex Staple / Suture removal (26 or more) []  - 0 Hypo / Hyperglycemic Management (close monitor of Blood Glucose) []  - 0 Ankle / Brachial Index (ABI) - do not check if billed separately X- 1 5 Vital Signs Has the patient been seen at the hospital within the last three years: Yes Total Score: 165 Level Of Care: New/Established - Level 5 Electronic Signature(s) Signed: 06/06/2021 4:09:47 PM By: Donnamarie Poag Entered By: Donnamarie Poag on 06/05/2021 16:50:19 Clayton [name] ([id number]) -------------------------------------------------------------------------------- Encounter Discharge Information Details Patient Name: Clayton [name]. Date of Service: 06/05/2021 3:45 PM Medical Record Number: [id number] Patient Account Number: [id number] Date of Birth/Sex: 1928-01-06 (87 y.o. M) Treating RN: Donnamarie Poag Primary Care Vear Staton: Dorthy Cooler, Dibas Other Clinician: Referring Leanna Hamid: Dorthy Cooler, Dibas Treating Dorn Hartshorne/Extender: Skipper Cliche in Treatment: 6 Encounter Discharge  Information Items Discharge Condition: Stable Ambulatory Status: Wheelchair Discharge Destination: Home Transportation: Private Auto Accompanied By: daughter Schedule Follow-up Appointment: Yes Clinical Summary of Care: Electronic Signature(s) Signed: 06/06/2021 4:09:47 PM By: Donnamarie Poag Entered By: Donnamarie Poag on 06/05/2021 16:54:03 Clayton [name] ([id number]) -------------------------------------------------------------------------------- Lower Extremity Assessment Details Patient Name: Clayton [name]. Date of Service: 06/05/2021 3:45 PM Medical Record Number: [id number] Patient Account Number: [id number] Date of Birth/Sex: 01-Mar-1928 (87 y.o. M) Treating RN: Donnamarie Poag Primary Care Taunja Brickner: Lujean Amel Other Clinician: Referring Lavonne Kinderman: Dorthy Cooler, Dibas Treating Shella Lahman/Extender: Jeri Cos Weeks in Treatment: 6 Edema Assessment Assessed: [Left: Yes] [Right: Yes] Edema: [Left: Yes] [Right: Yes] Vascular Assessment Pulses: Dorsalis Pedis Palpable: [Left:Yes] [Right:Yes] Electronic Signature(s) Signed: 06/05/2021 4:31:08 PM By: Donnamarie Poag Entered By: Donnamarie Poag on 06/05/2021 16:22:11 Clayton [name] ([id number]) -------------------------------------------------------------------------------- Multi Wound Chart Details Patient Name: Clayton [name]. Date of Service: 06/05/2021 3:45 PM Medical Record Number: [id number] Patient Account Number: [id number] Date of Birth/Sex: 1928-06-09 (87 y.o. M) Treating RN: Donnamarie Poag Primary Care Akeen Ledyard: Dorthy Cooler, Dibas Other Clinician: Referring Amairani Shuey: Dorthy Cooler, Dibas Treating Chaelyn Bunyan/Extender: Skipper Cliche in Treatment: 6 Vital Signs Height(in): 48 Pulse(bpm): 38 Weight(lbs): 198 Blood Pressure(mmHg): 158/71 Body Mass Index(BMI): 29 Temperature(F): 98.3 Respiratory Rate(breaths/min): 16 Photos: [10:No Photos] Wound Location: Right, Lateral Ankle Right Gluteus Right, Distal Forearm Wounding Event:  Gradually Appeared Trauma Skin Tear/Laceration Primary Etiology: Inflammatory Pressure Ulcer Skin Tear Comorbid History: Cataracts, Anemia, Chronic N/A Cataracts, Anemia, Chronic Obstructive Pulmonary Disease Obstructive Pulmonary Disease (COPD), Sleep Apnea, Arrhythmia, (COPD), Sleep Apnea, Arrhythmia, Coronary Artery Disease, Coronary Artery Disease, Myocardial Infarction, Type II Myocardial Infarction, Type II Diabetes, Osteomyelitis Diabetes, Osteomyelitis Date Acquired: 12/26/2020 05/16/2020 05/29/2021 Weeks of Treatment: 6 2 0 Wound Status: Open Healed - Epithelialized Open Measurements L x W x D (cm) 1.1x0.4x0.2 0x0x0 1.7x2x0.1 Area (cm) : 0.346 0 2.67 Volume (cm) : 0.069 0 0.267 % Reduction in Area: 74.20% 100.00% N/A %  Reduction in Volume: 87.20% 100.00% N/A Classification: Full Thickness Without Exposed Category/Stage II Full Thickness Without Exposed Support Structures Support Structures Exudate Amount: Large Medium Medium Exudate Type: Serous Serosanguineous Serosanguineous Exudate Color: amber red, brown red, brown Wound Margin: Well defined, not attached N/A N/A Granulation Amount: Medium (34-66%) N/A Large (67-100%) Granulation Quality: Red, Hyper-granulation N/A Red, Pink Necrotic Amount: Medium (34-66%) N/A Small (1-33%) Necrotic Tissue: Adherent [place] Exposed Structures: Fat Layer (Subcutaneous Tissue): N/A Fat Layer (Subcutaneous Tissue): Yes Yes Fascia: No Fascia: No Tendon: No Tendon: No Muscle: No Muscle: No Joint: No Joint: No Bone: No Bone: No Epithelialization: None N/A N/A Wound Number: [id number] [phone]  Photos: No Photos No Photos ARAD, BURSTON ([id number]) Wound Location: Left, Anterior Elbow Left, Posterior Elbow Right, Anterior Elbow Wounding Event: Trauma Trauma Trauma Primary Etiology: Trauma, Other Trauma, Other Trauma, Other Comorbid History: Cataracts, Anemia, Chronic N/A N/A Obstructive Pulmonary Disease (COPD), Sleep  Apnea, Arrhythmia, Coronary Artery Disease, Myocardial Infarction, Type II Diabetes, Osteomyelitis Date Acquired: 05/15/2021 05/15/2021 05/19/2021 Weeks of Treatment: 2 2 2  Wound Status: Open Healed - Epithelialized Healed - Epithelialized Measurements L x W x D (cm) 0.5x0.9x0.1 0x0x0 0x0x0 Area (cm) : 0.353 0 0 Volume (cm) : 0.035 0 0 % Reduction in Area: 50.10% 100.00% 100.00% % Reduction in Volume: 50.70% 100.00% 100.00% Classification: Full Thickness Without Exposed Full Thickness Without Exposed Full Thickness Without Exposed Support Structures Support Structures Support Structures Exudate Amount: Medium Medium Medium Exudate Type: Serosanguineous Serosanguineous Serosanguineous Exudate Color: red, brown red, brown red, brown Wound Margin: N/A N/A N/A Granulation Amount: None Present (0%) N/A N/A Granulation Quality: N/A N/A N/A Necrotic Amount: Large (67-100%) N/A N/A Necrotic Tissue: Eschar N/A N/A Exposed Structures: Fat Layer (Subcutaneous Tissue): N/A N/A Yes Fascia: No Tendon: No Muscle: No Joint: No Bone: No Epithelialization: N/A N/A N/A Wound Number: [id number] N/A Photos: N/A Wound Location: Right, Posterior Elbow Right Lower Leg N/A Wounding Event: Trauma Trauma N/A Primary Etiology: Trauma, Other Trauma, Other N/A Comorbid History: Cataracts, Anemia, Chronic Cataracts, Anemia, Chronic N/A Obstructive Pulmonary Disease Obstructive Pulmonary Disease (COPD), Sleep Apnea, Arrhythmia, (COPD), Sleep Apnea, Arrhythmia, Coronary Artery Disease, Coronary Artery Disease, Myocardial Infarction, Type II Myocardial Infarction, Type II Diabetes, Osteomyelitis Diabetes, Osteomyelitis Date Acquired: 05/19/2021 05/15/2021 N/A Weeks of Treatment: 2 2 N/A Wound Status: Open Open N/A Measurements L x W x D (cm) 0.7x0.5x0.1 1.8x1.4x0.1 N/A Area (cm) : 0.275 1.979 N/A Volume (cm) : 0.027 0.198 N/A % Reduction in Area: 76.70% 80.90% N/A % Reduction in Volume: 77.10% 80.90%  N/A Classification: Full Thickness Without Exposed Full Thickness Without Exposed N/A Support Structures Support Structures Exudate Amount: Medium Medium N/A Exudate Type: Serosanguineous Serosanguineous N/A Exudate Color: red, brown red, brown N/A Wound Margin: N/A N/A N/A CRISTHIAN, VANHOOK ([id number]) Granulation Amount: None Present (0%) Medium (34-66%) N/A Granulation Quality: N/A Red N/A Necrotic Amount: Large (67-100%) Medium (34-66%) N/A Necrotic Tissue: Eschar Eschar, Adherent Slough N/A Exposed Structures: Fat Layer (Subcutaneous Tissue): Fat Layer (Subcutaneous Tissue): N/A Yes Yes Fascia: No Fascia: No Tendon: No Tendon: No Muscle: No Muscle: No Joint: No Joint: No Bone: No Bone: No Epithelialization: N/A None N/A Treatment Notes Electronic Signature(s) Signed: 06/05/2021 4:31:08 PM By: Donnamarie Poag Entered By: Donnamarie Poag on 06/05/2021 16:23:28 Clayton [name] ([id number]) -------------------------------------------------------------------------------- [place] Details Patient Name: Clayton [name]. Date of Service: 06/05/2021 3:45 PM Medical Record Number: [id number] Patient Account Number: [id number] Date of Birth/Sex: 1927/12/08 (87 y.o. M) Treating RN: Donnamarie Poag  Primary Care Brittain Smithey: Dorthy Cooler, Dibas Other Clinician: Referring Kionte Baumgardner: Dorthy Cooler, Dibas Treating Marcine Gadway/Extender: Skipper Cliche in Treatment: 6 Active Inactive Necrotic Tissue Nursing Diagnoses: Impaired tissue integrity related to necrotic/devitalized tissue Knowledge deficit related to management of necrotic/devitalized tissue Goals: Necrotic/devitalized tissue will be minimized in the wound bed Date Initiated: 04/24/2021 Target Resolution Date: 05/24/2021 Goal Status: Active Patient/caregiver will verbalize understanding of reason and process for debridement of necrotic tissue Date Initiated: 04/24/2021 Date Inactivated: 05/08/2021 Target Resolution Date:  05/24/2021 Goal Status: Met Interventions: Assess patient pain level pre-, during and post procedure and prior to discharge Provide education on necrotic tissue and debridement process Treatment Activities: Apply topical anesthetic as ordered : 04/24/2021 Excisional debridement : 04/24/2021 Notes: Soft Tissue Infection Nursing Diagnoses: Impaired tissue integrity Knowledge deficit related to disease process and management Knowledge deficit related to home infection control: handwashing, handling of soiled dressings, supply storage Potential for infection: soft tissue Goals: Signs and symptoms of infection will be recognized early to allow for prompt treatment Date Initiated: 04/24/2021 Target Resolution Date: 05/25/2021 Goal Status: Active Interventions: Assess signs and symptoms of infection every visit Notes: Wound/Skin Impairment Nursing Diagnoses: Impaired tissue integrity Goals: Patient/caregiver will verbalize understanding of skin care regimen Date Initiated: 04/24/2021 Date Inactivated: 05/08/2021 Target Resolution Date: 04/24/2021 Goal Status: Met Ulcer/skin breakdown will have a volume reduction of 30% by week 4 Date Initiated: 04/24/2021 Target Resolution Date: 05/24/2021 Goal Status: Active ADVIK, WEATHERSPOON ([id number]) Interventions: Assess ulceration(s) every visit Treatment Activities: Skin care regimen initiated : 04/24/2021 Topical wound management initiated : 04/24/2021 Notes: Electronic Signature(s) Signed: 06/05/2021 4:31:08 PM By: Donnamarie Poag Entered By: Donnamarie Poag on 06/05/2021 16:23:02 Clayton [name] ([id number]) -------------------------------------------------------------------------------- Pain Assessment Details Patient Name: Clayton [name]. Date of Service: 06/05/2021 3:45 PM Medical Record Number: [id number] Patient Account Number: [id number] Date of Birth/Sex: 07/13/1927 (87 y.o. M) Treating RN: Donnamarie Poag Primary Care Abdiaziz Klahn: Lujean Amel Other Clinician: Referring Ival Basquez: Dorthy Cooler, Dibas Treating Jeraldine Primeau/Extender: Skipper Cliche in Treatment: 6 Active Problems Location of Pain Severity and Description of Pain Patient Has Paino No Site Locations Rate the pain. Current Pain Level: 0 Pain Management and Medication Current Pain Management: Electronic Signature(s) Signed: 06/05/2021 4:31:08 PM By: Donnamarie Poag Entered By: Donnamarie Poag on 06/05/2021 16:04:19 Clayton [name] ([id number]) -------------------------------------------------------------------------------- Patient/Caregiver Education Details Patient Name: Clayton [name] Date of Service: 06/05/2021 3:45 PM Medical Record Number: [id number] Patient Account Number: [id number] Date of Birth/Gender: 01-May-1928 (87 y.o. M) Treating RN: Donnamarie Poag Primary Care Physician: Dorthy Cooler, Dibas Other Clinician: Referring Physician: Dorthy Cooler, Dibas Treating Physician/Extender: Skipper Cliche in Treatment: 6 Education Assessment Education Provided To: Patient and Caregiver Education Topics Provided Safety: Handouts: Other: Due to falls and injury patient needs assistance when moving around Wound/Skin Impairment: Handouts: Caring for Your Ulcer Methods: Demonstration, Explain/Verbal Responses: State content correctly Electronic Signature(s) Signed: 06/06/2021 4:09:47 PM By: Donnamarie Poag Entered By: Donnamarie Poag on 06/05/2021 16:51:48 Clayton [name] ([id number]) -------------------------------------------------------------------------------- Wound Assessment Details Patient Name: Clayton [name]. Date of Service: 06/05/2021 3:45 PM Medical Record Number: [id number] Patient Account Number: [id number] Date of Birth/Sex: 1928/01/22 (87 y.o. M) Treating RN: Donnamarie Poag Primary Care Kenleigh Toback: Lujean Amel Other Clinician: Referring Ayuub Penley: Dorthy Cooler, Dibas Treating Davontay Watlington/Extender: Skipper Cliche in Treatment: 6 Wound Status Wound Number: 1  Primary Inflammatory Etiology: Wound Location: Right, Lateral Ankle Wound Open Wounding Event: Gradually Appeared Status: Date Acquired: 12/26/2020 Comorbid Cataracts, Anemia, Chronic Obstructive Pulmonary Disease Weeks Of Treatment: 6 History: (COPD), Sleep Apnea, Arrhythmia, Coronary Artery Clustered  Wound: No Disease, Myocardial Infarction, Type II Diabetes, Osteomyelitis Photos Wound Measurements Length: (cm) 1.1 Width: (cm) 0.4 Depth: (cm) 0.2 Area: (cm) 0.346 Volume: (cm) 0.069 % Reduction in Area: 74.2% % Reduction in Volume: 87.2% Epithelialization: None Tunneling: No Undermining: No Wound Description Classification: Full Thickness Without Exposed Support Structu Wound Margin: Well defined, not attached Exudate Amount: Large Exudate Type: Serous Exudate Color: amber res Foul Odor After Cleansing: No Slough/Fibrino Yes Wound Bed Granulation Amount: Medium (34-66%) Exposed Structure Granulation Quality: Red, Hyper-granulation Fascia Exposed: No Necrotic Amount: Medium (34-66%) Fat Layer (Subcutaneous Tissue) Exposed: Yes Necrotic Quality: Adherent Slough Tendon Exposed: No Muscle Exposed: No Joint Exposed: No Bone Exposed: No Treatment Notes Wound #1 (Ankle) Wound Laterality: Right, Lateral Cleanser Soap and Water Discharge Instruction: Gently cleanse wound with antibacterial soap, rinse and pat dry prior to dressing wounds OLUWANIFEMI, SUSMAN ([id number]) Wound Cleanser Discharge Instruction: Wash your hands with soap and water. Remove old dressing, discard into plastic bag and place into trash. Cleanse the wound with Wound Cleanser prior to applying a clean dressing using gauze sponges, not tissues or cotton balls. Do not scrub or use excessive force. Pat dry using gauze sponges, not tissue or cotton balls. Peri-Wound Care Topical Primary Dressing Xeroform-HBD 2x2 (in/in) Discharge Instruction: Apply Xeroform-HBD 2x2 (in/in) as directed Secondary  Dressing Zetuvit Plus Silicone Border Dressing 4x4 (in/in) Secured With Compression Wrap Compression Stockings Add-Ons Electronic Signature(s) Signed: 06/05/2021 4:31:08 PM By: Donnamarie Poag Entered By: Donnamarie Poag on 06/05/2021 16:18:56 Clayton [name] ([id number]) -------------------------------------------------------------------------------- Wound Assessment Details Patient Name: Clayton [name]. Date of Service: 06/05/2021 3:45 PM Medical Record Number: [id number] Patient Account Number: [id number] Date of Birth/Sex: [birth year] (87 y.o. M) Treating RN: Donnamarie Poag Primary Care Ezriel Boffa: Dorthy Cooler, Dibas Other Clinician: Referring Saagar Tortorella: Dorthy Cooler, Dibas Treating Dao Mearns/Extender: Skipper Cliche in Treatment: 6 Wound Status Wound Number: 10 Primary Etiology: Pressure Ulcer Wound Location: Right Gluteus Wound Status: Healed - Epithelialized Wounding Event: Trauma Date Acquired: 05/16/2020 Weeks Of Treatment: 2 Clustered Wound: No Wound Measurements Length: (cm) 0 Width: (cm) 0 Depth: (cm) 0 Area: (cm) Volume: (cm) % Reduction in Area: 100% % Reduction in Volume: 100% 0 0 Wound Description Classification: Category/Stage II Exudate Amount: Medium Exudate Type: Serosanguineous Exudate Color: red, brown Treatment Notes Wound #10 (Gluteus) Wound Laterality: Right Cleanser Peri-Wound Care Topical Primary Dressing Secondary Dressing Secured With Compression Wrap Compression Stockings Add-Ons Electronic Signature(s) Signed: 06/05/2021 4:31:08 PM By: Donnamarie Poag Entered By: Donnamarie Poag on 06/05/2021 16:15:03 Clayton [name] ([id number]) -------------------------------------------------------------------------------- Wound Assessment Details Patient Name: Clayton [name]. Date of Service: 06/05/2021 3:45 PM Medical Record Number: [id number] Patient Account Number: [id number] Date of Birth/Sex: 16-Oct-1927 (87 y.o. M) Treating RN: Donnamarie Poag Primary Care  Azari Hasler: Dorthy Cooler, Dibas Other Clinician: Referring Leonetta Mcgivern: Dorthy Cooler, Dibas Treating Maylee Bare/Extender: Skipper Cliche in Treatment: 6 Wound Status Wound Number: 11 Primary Skin Tear Etiology: Wound Location: Right, Distal Forearm Wound Open Wounding Event: Skin Tear/Laceration Status: Date Acquired: 05/29/2021 Comorbid Cataracts, Anemia, Chronic Obstructive Pulmonary Disease Weeks Of Treatment: 0 History: (COPD), Sleep Apnea, Arrhythmia, Coronary Artery Clustered Wound: No Disease, Myocardial Infarction, Type II Diabetes, Osteomyelitis Photos Wound Measurements Length: (cm) 1.7 Width: (cm) 2 Depth: (cm) 0.1 Area: (cm) 2.67 Volume: (cm) 0.267 % Reduction in Area: % Reduction in Volume: Tunneling: No Undermining: No Wound Description Classification: Full Thickness Without Exposed Support Structu Exudate Amount: Medium Exudate Type: Serosanguineous Exudate Color: red, brown res Foul Odor After Cleansing: No Slough/Fibrino No Wound Bed Granulation Amount: Large (67-100%) Exposed  Structure Granulation Quality: Red, Pink Fascia Exposed: No Necrotic Amount: Small (1-33%) Fat Layer (Subcutaneous Tissue) Exposed: Yes Necrotic Quality: Adherent Slough Tendon Exposed: No Muscle Exposed: No Joint Exposed: No Bone Exposed: No Treatment Notes Wound #11 (Forearm) Wound Laterality: Right, Distal Cleanser Soap and Water Discharge Instruction: Gently cleanse wound with antibacterial soap, rinse and pat dry prior to dressing wounds Wound Cleanser BRANDI, ARMATO ([id number]) Discharge Instruction: Wash your hands with soap and water. Remove old dressing, discard into plastic bag and place into trash. Cleanse the wound with Wound Cleanser prior to applying a clean dressing using gauze sponges, not tissues or cotton balls. Do not scrub or use excessive force. Pat dry using gauze sponges, not tissue or cotton balls. Peri-Wound Care Topical Primary Dressing Xeroform-HBD  2x2 (in/in) Discharge Instruction: Apply Xeroform-HBD 2x2 (in/in) as directed Secondary Dressing ABD Pad 5x9 (in/in) Discharge Instruction: Cover with ABD pad Secured With Kerlix Roll Sterile or Non-Sterile 6-ply 4.5x4 (yd/yd) Discharge Instruction: Apply Kerlix as directed stretch net Discharge Instruction: to hold dressing in place. Compression Wrap Compression Stockings Add-Ons Electronic Signature(s) Signed: 06/05/2021 4:31:08 PM By: Donnamarie Poag Entered By: Donnamarie Poag on 06/05/2021 16:17:46 Clayton [name] ([id number]) -------------------------------------------------------------------------------- Wound Assessment Details Patient Name: Clayton [name]. Date of Service: 06/05/2021 3:45 PM Medical Record Number: [id number] Patient Account Number: [id number] Date of Birth/Sex: May 05, 1928 (87 y.o. M) Treating RN: Donnamarie Poag Primary Care Cleaster Shiffer: Dorthy Cooler, [place] Other Clinician: Referring Phenix Vandermeulen: Dorthy Cooler, [place] Treating Jamie Belger/Extender: Skipper Cliche in Treatment: 6 Wound Status Wound Number: 5 Primary Trauma, Other Etiology: Wound Location: Left, Anterior Elbow Wound Healed - Epithelialized Wounding Event: Trauma Status: Date Acquired: 05/15/2021 Comorbid Cataracts, Anemia, Chronic Obstructive Pulmonary Disease Weeks Of Treatment: 2 History: (COPD), Sleep Apnea, Arrhythmia, Coronary Artery Clustered Wound: No Disease, Myocardial Infarction, Type II Diabetes, Osteomyelitis Photos Wound Measurements Length: (cm) 0 Width: (cm) 0 Depth: (cm) 0 Area: (cm) Volume: (cm) % Reduction in Area: 100% % Reduction in Volume: 100% 0 0 Wound Description Classification: Full Thickness Without Exposed Support Structu Exudate Amount: Medium Exudate Type: Serosanguineous Exudate Color: red, brown res Foul Odor After Cleansing: No Slough/Fibrino No Wound Bed Granulation Amount: None Present (0%) Exposed Structure Necrotic Amount: Large (67-100%) Fascia Exposed:  No Necrotic Quality: Eschar Fat Layer (Subcutaneous Tissue) Exposed: Yes Tendon Exposed: No Muscle Exposed: No Joint Exposed: No Bone Exposed: No Treatment Notes Wound #5 (Elbow) Wound Laterality: Left, Anterior Cleanser Peri-Wound Care Topical ROBB, SIBAL ([id number]) Primary Dressing Secondary Dressing Secured With Compression Wrap Compression Stockings Add-Ons Electronic Signature(s) Signed: 06/06/2021 4:09:47 PM By: Donnamarie Poag Previous Signature: 06/05/2021 4:31:08 PM Version By: Donnamarie Poag Entered By: Donnamarie Poag on 06/05/2021 16:39:23 Clayton [name] ([id number]) -------------------------------------------------------------------------------- Wound Assessment Details Patient Name: Clayton [name]. Date of Service: 06/05/2021 3:45 PM Medical Record Number: [id number] Patient Account Number: [id number] Date of Birth/Sex: 09-16-1927 (87 y.o. M) Treating RN: Donnamarie Poag Primary Care Tan Clopper: Dorthy Cooler, [place] Other Clinician: Referring Abigael Mogle: Dorthy Cooler, [place] Treating Jalasia Eskridge/Extender: Skipper Cliche in Treatment: 6 Wound Status Wound Number: 6 Primary Etiology: Trauma, Other Wound Location: Left, Posterior Elbow Wound Status: Healed - Epithelialized Wounding Event: Trauma Date Acquired: 05/15/2021 Weeks Of Treatment: 2 Clustered Wound: No Wound Measurements Length: (cm) 0 Width: (cm) 0 Depth: (cm) 0 Area: (cm) Volume: (cm) % Reduction in Area: 100% % Reduction in Volume: 100% 0 0 Wound Description Classification: Full Thickness Without Exposed Support Structu Exudate Amount: Medium Exudate Type: Serosanguineous Exudate Color: red, brown res Treatment Notes Wound #6 (Elbow) Wound Laterality:  Left, Posterior Cleanser Peri-Wound Care Topical Primary Dressing Secondary Dressing Secured With Compression Wrap Compression Stockings Add-Ons Electronic Signature(s) Signed: 06/05/2021 4:31:08 PM By: Donnamarie Poag Entered By: Donnamarie Poag on  06/05/2021 16:12:08 Clayton [name] ([id number]) -------------------------------------------------------------------------------- Wound Assessment Details Patient Name: Clayton [name]. Date of Service: 06/05/2021 3:45 PM Medical Record Number: [id number] Patient Account Number: [id number] Date of Birth/Sex: Jun 24, 1927 (87 y.o. M) Treating RN: Donnamarie Poag Primary Care Ansen Sayegh: Dorthy Cooler, [place] Other Clinician: Referring Joycelyn Liska: Dorthy Cooler, [place] Treating Anayia Eugene/Extender: Skipper Cliche in Treatment: 6 Wound Status Wound Number: 7 Primary Etiology: Trauma, Other Wound Location: Right, Anterior Elbow Wound Status: Healed - Epithelialized Wounding Event: Trauma Date Acquired: 05/19/2021 Weeks Of Treatment: 2 Clustered Wound: No Wound Measurements Length: (cm) 0 Width: (cm) 0 Depth: (cm) 0 Area: (cm) Volume: (cm) % Reduction in Area: 100% % Reduction in Volume: 100% 0 0 Wound Description Classification: Full Thickness Without Exposed Support Structu Exudate Amount: Medium Exudate Type: Serosanguineous Exudate Color: red, brown res Treatment Notes Wound #7 (Elbow) Wound Laterality: Right, Anterior Cleanser Peri-Wound Care Topical Primary Dressing Secondary Dressing Secured With Compression Wrap Compression Stockings Add-Ons Electronic Signature(s) Signed: 06/05/2021 4:31:08 PM By: Donnamarie Poag Entered By: Donnamarie Poag on 06/05/2021 16:12:08 Clayton [name] ([id number]) -------------------------------------------------------------------------------- Wound Assessment Details Patient Name: Clayton [name]. Date of Service: 06/05/2021 3:45 PM Medical Record Number: [id number] Patient Account Number: [id number] Date of Birth/Sex: 06-27-1927 (87 y.o. M) Treating RN: Donnamarie Poag Primary Care Trustin Chapa: Dorthy Cooler, [place] Other Clinician: Referring An Lannan: Dorthy Cooler, [place] Treating Veatrice Eckstein/Extender: Skipper Cliche in Treatment: 6 Wound Status Wound Number: 8  Primary Trauma, Other Etiology: Wound Location: Right, Posterior Elbow Wound Healed - Epithelialized Wounding Event: Trauma Status: Date Acquired: 05/19/2021 Comorbid Cataracts, Anemia, Chronic Obstructive Pulmonary Disease Weeks Of Treatment: 2 History: (COPD), Sleep Apnea, Arrhythmia, Coronary Artery Clustered Wound: No Disease, Myocardial Infarction, Type II Diabetes, Osteomyelitis Photos Wound Measurements Length: (cm) 0 Width: (cm) 0 Depth: (cm) 0 Area: (cm) Volume: (cm) % Reduction in Area: 100% % Reduction in Volume: 100% Tunneling: No 0 Undermining: No 0 Wound Description Classification: Full Thickness Without Exposed Support Structu Exudate Amount: Medium Exudate Type: Serosanguineous Exudate Color: red, brown res Slough/Fibrino No Wound Bed Granulation Amount: None Present (0%) Exposed Structure Necrotic Amount: Large (67-100%) Fascia Exposed: No Necrotic Quality: Eschar Fat Layer (Subcutaneous Tissue) Exposed: Yes Tendon Exposed: No Muscle Exposed: No Joint Exposed: No Bone Exposed: No Treatment Notes Wound #8 (Elbow) Wound Laterality: Right, Posterior Cleanser Peri-Wound Care Topical RICKIE, GANGE ([id number]) Primary Dressing Secondary Dressing Secured With Compression Wrap Compression Stockings Add-Ons Electronic Signature(s) Signed: 06/06/2021 4:09:47 PM By: Donnamarie Poag Previous Signature: 06/05/2021 4:31:08 PM Version By: Donnamarie Poag Entered By: Donnamarie Poag on 06/05/2021 16:40:26 Clayton [name] ([id number]) -------------------------------------------------------------------------------- Wound Assessment Details Patient Name: Clayton [name]. Date of Service: 06/05/2021 3:45 PM Medical Record Number: [id number] Patient Account Number: [id number] Date of Birth/Sex: [birth year] (87 y.o. M) Treating RN: Donnamarie Poag Primary Care Jye Fariss: Dorthy Cooler, [place] Other Clinician: Referring Tyrie Porzio: Dorthy Cooler, [place] Treating Mccall Will/Extender:  Skipper Cliche in Treatment: 6 Wound Status Wound Number: 9 Primary Trauma, Other Etiology: Wound Location: Right Lower Leg Wound Open Wounding Event: Trauma Status: Date Acquired: 05/15/2021 Comorbid Cataracts, Anemia, Chronic Obstructive Pulmonary Disease Weeks Of Treatment: 2 History: (COPD), Sleep Apnea, Arrhythmia, Coronary Artery Clustered Wound: No Disease, Myocardial Infarction, Type II Diabetes, Osteomyelitis Photos Wound Measurements Length: (cm) 1.8 Width: (cm) 1.4 Depth: (cm) 0.1 Area: (cm) 1.979 Volume: (cm) 0.198 % Reduction in Area: 80.9% % Reduction in  Volume: 80.9% Epithelialization: None Tunneling: No Undermining: No Wound Description Classification: Full Thickness Without Exposed Support Structu Exudate Amount: Medium Exudate Type: Serosanguineous Exudate Color: red, brown res Foul Odor After Cleansing: No Slough/Fibrino Yes Wound Bed Granulation Amount: Medium (34-66%) Exposed Structure Granulation Quality: Red Fascia Exposed: No Necrotic Amount: Medium (34-66%) Fat Layer (Subcutaneous Tissue) Exposed: Yes Necrotic Quality: Eschar, Adherent Slough Tendon Exposed: No Muscle Exposed: No Joint Exposed: No Bone Exposed: No Treatment Notes Wound #9 (Lower Leg) Wound Laterality: Right Cleanser Soap and Water Discharge Instruction: Gently cleanse wound with antibacterial soap, rinse and pat dry prior to dressing wounds Wound Cleanser ISAIAH, CIANCI ([id number]) Discharge Instruction: Wash your hands with soap and water. Remove old dressing, discard into plastic bag and place into trash. Cleanse the wound with Wound Cleanser prior to applying a clean dressing using gauze sponges, not tissues or cotton balls. Do not scrub or use excessive force. Pat dry using gauze sponges, not tissue or cotton balls. Peri-Wound Care Topical Primary Dressing Xeroform-HBD 2x2 (in/in) Discharge Instruction: Apply Xeroform-HBD 2x2 (in/in) as  directed Secondary Dressing Zetuvit Plus Silicone Border Dressing 4x4 (in/in) Secured With Compression Wrap Compression Stockings Add-Ons Electronic Signature(s) Signed: 06/05/2021 4:31:08 PM By: Donnamarie Poag Entered By: Donnamarie Poag on 06/05/2021 16:21:28 Clayton [name] ([id number]) -------------------------------------------------------------------------------- [facility] Details Patient Name: Clayton [name]. Date of Service: 06/05/2021 3:45 PM Medical Record Number: [id number] Patient Account Number: [id number] Date of Birth/Sex: 03-Jul-1927 (87 y.o. M) Treating RN: Donnamarie Poag Primary Care Teigan Sahli: Dorthy Cooler, [place] Other Clinician: Referring Ramari Bray: Dorthy Cooler, [place] Treating Abdulai Blaylock/Extender: Skipper Cliche in Treatment: 6 Vital Signs Time Taken: 16:03 Temperature (F): 98.3 Height (in): 69 Pulse (bpm): 78 Weight (lbs): 198 Respiratory Rate (breaths/min): 16 Body Mass Index (BMI): 29.2 Blood Pressure (mmHg): 158/71 Reference Range: 80 - 120 mg / dl Electronic Signature(s) Signed: 06/05/2021 4:31:08 PM By: Donnamarie Poag Entered ByDonnamarie Poag on 06/05/2021 16:04:04

## 2021-06-06 NOTE — Progress Notes (Signed)
MYKAI, WENDORF ([id number]) Visit Report for 06/05/2021 Chief Complaint Document Details Patient Name: Clayton [name], Clayton [name]. Date of Service: 06/05/2021 3:45 PM Medical Record Number: [id number] Patient Account Number: [id number] Date of Birth/Sex: 08-25-1927 (87 y.o. M) Treating RN: Levora Dredge Primary Care Provider: Dorthy Cooler, Dibas Other Clinician: Referring Provider: Dorthy Cooler, [place] Treating Provider/Extender: Skipper Cliche in Treatment: 6 Information Obtained from: Patient Chief Complaint Right ankle, right knee, and right wrist ulcers Electronic Signature(s) Signed: 06/05/2021 5:25:46 PM By: Worthy Keeler PA-C Entered By: Worthy Keeler on 06/05/2021 17:25:45 Clayton [name] ([id number]) -------------------------------------------------------------------------------- HPI Details Patient Name: Clayton [name] Date of Service: 06/05/2021 3:45 PM Medical Record Number: [id number] Patient Account Number: [id number] Date of Birth/Sex: [birth year] (87 y.o. M) Treating RN: Levora Dredge Primary Care Provider: Dorthy Cooler, [place] Other Clinician: Referring Provider: Dorthy Cooler, [place] Treating Provider/Extender: Skipper Cliche in Treatment: 6 History of Present Illness HPI Description: 04/24/2021 upon evaluation today patient presents for initial inspection here in the clinic concerning an issue that has been going on with wounds on the right lateral ankle region, left lower leg, and left gluteal location. Unfortunately the patient does have significant anemia and had a blood transfusion in September. He has been having erythropoietin shots and lives [facility] in [place]. Most of these wounds are actually stated to be trauma/laceration in nature. The patient does have diabetes mellitus type 2, chronic kidney disease stage IV, and iron deficiency anemia. 05/08/2021 upon evaluation today patient's left leg actually appears to be doing pretty well with just a couple dry areas  at this point there is no real need for any specific dressings at this point in my opinion. I think the biggest thing is just a compression sock and keeping this area as dry as possible and from swelling. In regard to the right leg there does appear to be some evidence of cellulitis which has been a little bit more concerned here. Is a little warm to touch not sure if this is just inflammation due to venous stasis or if there is actual infection here again there is really not a good area to culture although the good news is the patient does seem to be doing quite well with regard to the healing in this ankle region. 05/22/2021 upon evaluation today patient appears to be doing somewhat poorly in regard to his wounds. He has been tolerating the dressing changes in general and I think the ankle is doing okay though a little bit hypergranular. In regard to the remaining wounds most of these were all new as result of skin tears from falls that he has been having frequently. He is currently at [facility] in [place]. This is an assisted living. With that being said I am beginning to wonder whether assisted living is really the proper placement for him to be honest. 06/05/2021 upon evaluation today patient unfortunately is having some issues here with his right wrist he has a skin tear. He also has some issues when it comes down to his right knee and his right ankle both of which appear to be doing much better however compared to last visit. Right now I think the biggest issue is the new skin tear on the wrist which occurred a week ago and really has not been taking care of properly. Patient's wound was not reapproximated as far as the skin that was pulled back and this may or may not hinder his healing prospects. I Georgina Peer try to pull it back down and  Steri-Stripped it down if at all possible. Electronic Signature(s) Signed: 06/05/2021 5:26:45 PM By: Worthy Keeler PA-C Entered By: Worthy Keeler on  06/05/2021 17:26:44 Clayton [name] ([id number]) -------------------------------------------------------------------------------- Physical Exam Details Patient Name: Clayton [name], Clayton [name]. Date of Service: 06/05/2021 3:45 PM Medical Record Number: [id number] Patient Account Number: [id number] Date of Birth/Sex: 16-Dec-1927 (87 y.o. M) Treating RN: Levora Dredge Primary Care Provider: Dorthy Cooler, [place] Other Clinician: Referring Provider: Dorthy Cooler, D[place] Treating Provider/Extender: Skipper Cliche in Treatment: 6 Constitutional Well-nourished and well-hydrated in no acute distress. Respiratory normal breathing without difficulty. Psychiatric this patient is able to make decisions and demonstrates good insight into disease process. Alert and Oriented x 3. pleasant and cooperative. Notes I did actually spread out the patient's skin on the right wrist and was able to get it to pull back over and approximate. I am not certain if this is do to survive after a week of being torn and rolled back although it seemed to be fairly healthy we will see how things go I did Steri-Strip this with 2 Steri-Strips Xeroform applied over top to keep it moist and we will see how things do in 2 weeks time this can stay on as far as the Steri-Strips are concerned until then the Xeroform can be changed with the other dressings. Electronic Signature(s) Signed: 06/05/2021 5:27:18 PM By: Worthy Keeler PA-C Entered By: Worthy Keeler on 06/05/2021 17:27:17 Clayton [name] ([id number]) -------------------------------------------------------------------------------- Physician Orders Details Patient Name: Clayton [name], Clayton [name]. Date of Service: 06/05/2021 3:45 PM Medical Record Number: [id number] Patient Account Number: [id number] Date of Birth/Sex: 1928-04-28 (87 y.o. M) Treating RN: Donnamarie Poag Primary Care Provider: Lujean Amel Other Clinician: Referring Provider: Dorthy Cooler, D[place] Treating Provider/Extender: Skipper Cliche in Treatment: 6 Verbal / Phone Orders: No Diagnosis Coding Follow-up Appointments o Return Appointment in 2 weeks. [place], [place], [place], [place] for wound care. May utilize formulary equivalent dressing for wound treatment orders unless otherwise specified. Home Health Nurse may visit PRN to address patientos wound care needs. - Three times weekly o Scheduled days for dressing changes to be completed; exception, patient has scheduled wound care visit that day. o **Please direct any NON-WOUND related issues/requests for orders to patient's Primary Care Physician. **If current dressing causes regression in wound condition, may D/C ordered dressing product/s and apply Normal Saline Moist Dressing daily until next [place] or Other MD appointment. **Notify [facility] of regression in wound condition at [phone]. Bathing/ Shower/ Hygiene o May shower; gently cleanse wound with antibacterial soap, rinse and pat dry prior to dressing wounds - KEEP DRESSING DRY OR CHANGE AFTER SHOWER o No tub bath. Edema Control - Lymphedema / Segmental Compressive Device / Other o Patient to wear own compression stockings. Remove compression stockings every night before going to bed and put on every morning when getting up. o Elevate, Exercise Daily and Avoid Standing for Long Periods of Time. o Elevate legs to the level of the heart and pump ankles as often as possible o Elevate leg(s) parallel to the floor when sitting. o DO YOUR BEST to sleep in the bed at night. DO NOT sleep in your recliner. Long hours of sitting in a recliner leads to swelling of the legs and/or potential wounds on your backside. Additional Orders / Instructions o Follow Nutritious Diet and Increase Protein Intake o Other: - Due to multiple falls, patient need  assistance when  moving about. Wound Treatment Wound #1 - Ankle Wound Laterality: Right, Lateral Cleanser: Soap and Water (Home Health) 3 x Per Week/30 Days Discharge Instructions: Gently cleanse wound with antibacterial soap, rinse and pat dry prior to dressing wounds Cleanser: Wound Cleanser ([place]) 3 x Per Week/30 Days Discharge Instructions: Wash your hands with soap and water. Remove old dressing, discard into plastic bag and place into trash. Cleanse the wound with Wound Cleanser prior to applying a clean dressing using gauze sponges, not tissues or cotton balls. Do not scrub or use excessive force. Pat dry using gauze sponges, not tissue or cotton balls. Primary Dressing: Xeroform-HBD 2x2 (in/in) 3 x Per Week/30 Days Discharge Instructions: Apply Xeroform-HBD 2x2 (in/in) as directed Secondary Dressing: Zetuvit Plus Silicone Border Dressing 4x4 (in/in) (Home Health) (Generic) 3 x Per Week/30 Days Wound #11 - Forearm Wound Laterality: Right, Distal Cleanser: Soap and Water (Home Health) 3 x Per Week/30 Days Discharge Instructions: Gently cleanse wound with antibacterial soap, rinse and pat dry prior to dressing wounds Cleanser: Wound Cleanser ([place]) 3 x Per Week/30 Days Discharge Instructions: Wash your hands with soap and water. Remove old dressing, discard into plastic bag and place into trash. Cleanse the wound with Wound Cleanser prior to applying a clean dressing using gauze sponges, not tissues or cotton balls. Do not scrub or use excessive force. Pat dry using gauze sponges, not tissue or cotton balls. Primary Dressing: Xeroform-HBD 2x2 (in/in) (Home Health) 3 x Per Week/30 Days ASHAAD, GAERTNER ([id number]) Discharge Instructions: Apply Xeroform-HBD 2x2 (in/in) as directed Secondary Dressing: ABD Pad 5x9 (in/in) (Home Health) 3 x Per Week/30 Days Discharge Instructions: Cover with ABD pad Secured With: Kerlix Roll Sterile or Non-Sterile 6-ply 4.5x4 (yd/yd) ([place]) 3 x Per  Week/30 Days Discharge Instructions: Apply Kerlix as directed Secured With: stretch net (Home Health) 3 x Per Week/30 Days Discharge Instructions: to hold dressing in place. Wound #9 - Lower Leg Wound Laterality: Right Cleanser: Soap and Water (Home Health) 3 x Per Week/30 Days Discharge Instructions: Gently cleanse wound with antibacterial soap, rinse and pat dry prior to dressing wounds Cleanser: Wound Cleanser ([place]) 3 x Per Week/30 Days Discharge Instructions: Wash your hands with soap and water. Remove old dressing, discard into plastic bag and place into trash. Cleanse the wound with Wound Cleanser prior to applying a clean dressing using gauze sponges, not tissues or cotton balls. Do not scrub or use excessive force. Pat dry using gauze sponges, not tissue or cotton balls. Primary Dressing: Xeroform-HBD 2x2 (in/in) 3 x Per Week/30 Days Discharge Instructions: Apply Xeroform-HBD 2x2 (in/in) as directed Secondary Dressing: Zetuvit Plus Silicone Border Dressing 4x4 (in/in) ([place]) (Generic) 3 x Per Week/30 Days Electronic Signature(s) Signed: 06/05/2021 5:42:10 PM By: Worthy Keeler PA-C Signed: 06/06/2021 4:09:47 PM By: Donnamarie Poag Entered By: Donnamarie Poag on 06/05/2021 16:49:19 Clayton [name] ([id number]) -------------------------------------------------------------------------------- Problem List Details Patient Name: Clayton [name]. Date of Service: 06/05/2021 3:45 PM Medical Record Number: [id number] Patient Account Number: [id number] Date of Birth/Sex: December 28, 1927 (87 y.o. M) Treating RN: Levora Dredge Primary Care Provider: Dorthy Cooler, [place] Other Clinician: Referring Provider: Dorthy Cooler, [place] Treating Provider/Extender: Skipper Cliche in Treatment: 6 Active Problems ICD-10 Encounter Code Description Active Date MDM Diagnosis E11.622 Type 2 diabetes mellitus with other skin ulcer 04/24/2021 No Yes L97.312 Non-pressure chronic ulcer of right ankle with fat  layer exposed 04/24/2021 No Yes S80.211A Abrasion, right knee, initial encounter 05/22/2021 No Yes S61.511A Laceration without foreign  body of right wrist, initial encounter 06/05/2021 No Yes N18.4 Chronic kidney disease, stage 4 (severe) 04/24/2021 No Yes 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 04/24/2021 No Yes Inactive Problems Resolved Problems ICD-10 Code Description Active Date Resolved Date L97.821 Non-pressure chronic ulcer of other part of left lower leg limited to breakdown 04/24/2021 04/24/2021 of skin S50.311A Abrasion of right elbow, initial encounter 05/22/2021 05/22/2021 S50.312A Abrasion of left elbow, initial encounter 05/22/2021 05/22/2021 L97.812 Non-pressure chronic ulcer of other part of right lower leg with fat layer 04/24/2021 04/24/2021 exposed S31.821A Laceration without foreign body of left buttock, initial encounter 04/24/2021 04/24/2021 L98.492 Non-pressure 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 04/24/2021 04/24/2021 Clayton [name] ([id number]) S81.812A Laceration without foreign body, left lower leg, initial encounter 04/24/2021 04/24/2021 Electronic Signature(s) Signed: 06/05/2021 5:25:23 PM By: Worthy Keeler PA-C Previous Signature: 06/05/2021 5:03:37 PM Version By: Worthy Keeler PA-C Entered By: Worthy Keeler on 06/05/2021 17:25:23 Clayton [name] ([id number]) -------------------------------------------------------------------------------- Progress Note Details Patient Name: Clayton [name]. Date of Service: 06/05/2021 3:45 PM Medical Record Number: [id number] Patient Account Number: [id number] Date of Birth/Sex: 1927/08/30 (87 y.o. M) Treating RN: Levora Dredge Primary Care Provider: Dorthy Cooler, D[place] Other Clinician: Referring Provider: Dorthy Cooler, [place] Treating Provider/Extender: Skipper Cliche in Treatment: 6 Subjective Chief Complaint Information obtained from Patient Right ankle, right knee, and right wrist ulcers History of Present  Illness (HPI) 04/24/2021 upon evaluation today patient presents for initial inspection here in the clinic concerning an issue that has been going on with wounds on the right lateral ankle region, left lower leg, and left gluteal location. Unfortunately the patient does have significant anemia and had a blood transfusion in September. He has been having erythropoietin shots and lives at [facility] in [place]. Most of these wounds are actually stated to be trauma/laceration in nature. The patient does have diabetes mellitus type 2, chronic kidney disease stage IV, and iron deficiency anemia. 05/08/2021 upon evaluation today patient's left leg actually appears to be doing pretty well with just a couple dry areas at this point there is no real need for any specific dressings at this point in my opinion. I think the biggest thing is just a compression sock and keeping this area as dry as possible and from swelling. In regard to the right leg there does appear to be some evidence of cellulitis which has been a little bit more concerned here. Is a little warm to touch not sure if this is just inflammation due to venous stasis or if there is actual infection here again there is really not a good area to culture although the good news is the patient does seem to be doing quite well with regard to the healing in this ankle region. 05/22/2021 upon evaluation today patient appears to be doing somewhat poorly in regard to his wounds. He has been tolerating the dressing changes in general and I think the ankle is doing okay though a little bit hypergranular. In regard to the remaining wounds most of these were all new as result of skin tears from falls that he has been having frequently. He is currently at [facility] in [place]. This is an assisted living. With that being said I am beginning to wonder whether assisted living is really the proper placement for him to be honest. 06/05/2021 upon evaluation  today patient unfortunately is having some issues here with his right wrist he has a skin tear. He also has some issues when it  comes down to his right knee and his right ankle both of which appear to be doing much better however compared to last visit. Right now I think the biggest issue is the new skin tear on the wrist which occurred a week ago and really has not been taking care of properly. Patient's wound was not reapproximated as far as the skin that was pulled back and this may or may not hinder his healing prospects. I Georgina Peer try to pull it back down and Steri-Stripped it down if at all possible. Objective Constitutional Well-nourished and well-hydrated in no acute distress. Vitals Time Taken: 4:03 PM, Height: 69 in, Weight: 198 lbs, BMI: 29.2, Temperature: 98.3 F, Pulse: 78 bpm, Respiratory Rate: 16 breaths/min, Blood Pressure: 158/71 mmHg. Respiratory normal breathing without difficulty. Psychiatric this patient is able to make decisions and demonstrates good insight into disease process. Alert and Oriented x 3. pleasant and cooperative. General Notes: I did actually spread out the patient's skin on the right wrist and was able to get it to pull back over and approximate. I am not certain if this is do to survive after a week of being torn and rolled back although it seemed to be fairly healthy we will see how things go I did Steri-Strip this with 2 Steri-Strips Xeroform applied over top to keep it moist and we will see how things do in 2 weeks time this can stay on as far as the Steri-Strips are concerned until then the Xeroform can be changed with the other dressings. Integumentary (Hair, Skin) Wound #1 status is Open. Original cause of wound was Gradually Appeared. The date acquired was: 12/26/2020. The wound has been in treatment 6 weeks. The wound is located on the Right,Lateral Ankle. The wound measures 1.1cm length x 0.4cm width x 0.2cm depth; 0.346cm^2 area and 0.069cm^3  volume. There is Fat Layer (Subcutaneous Tissue) exposed. There is no tunneling or undermining noted. There is a large amount of serous drainage noted. The wound margin is well defined and not attached to the wound base. There is medium (34-66%) red, hyper - Clayton [name], Clayton [name]. ([id number]) granulation within the wound bed. There is a medium (34-66%) amount of necrotic tissue within the wound bed including Adherent Slough. Wound #10 status is Healed - Epithelialized. Original cause of wound was Trauma. The date acquired was: 05/16/2020. The wound has been in treatment 2 weeks. The wound is located on the Right Gluteus. The wound measures 0cm length x 0cm width x 0cm depth; 0cm^2 area and 0cm^3 volume. There is a medium amount of serosanguineous drainage noted. Wound #11 status is Open. Original cause of wound was Skin Tear/Laceration. The date acquired was: 05/29/2021. The wound is located on the Right,Distal Forearm. The wound measures 1.7cm length x 2cm width x 0.1cm depth; 2.67cm^2 area and 0.267cm^3 volume. There is Fat Layer (Subcutaneous Tissue) exposed. There is no tunneling or undermining noted. There is a medium amount of serosanguineous drainage noted. There is large (67-100%) red, pink granulation within the wound bed. There is a small (1-33%) amount of necrotic tissue within the wound bed including Adherent Slough. Wound #5 status is Healed - Epithelialized. Original cause of wound was Trauma. The date acquired was: 05/15/2021. The wound has been in treatment 2 weeks. The wound is located on the Left,Anterior Elbow. The wound measures 0cm length x 0cm width x 0cm depth; 0cm^2 area and 0cm^3 volume. There is Fat Layer (Subcutaneous Tissue) exposed. There is a medium amount of serosanguineous drainage noted.  There is no granulation within the wound bed. There is a large (67-100%) amount of necrotic tissue within the wound bed including Eschar. Wound #6 status is Healed - Epithelialized.  Original cause of wound was Trauma. The date acquired was: 05/15/2021. The wound has been in treatment 2 weeks. The wound is located on the Left,Posterior Elbow. The wound measures 0cm length x 0cm width x 0cm depth; 0cm^2 area and 0cm^3 volume. There is a medium amount of serosanguineous drainage noted. Wound #7 status is Healed - Epithelialized. Original cause of wound was Trauma. The date acquired was: 05/19/2021. The wound has been in treatment 2 weeks. The wound is located on the Right,Anterior Elbow. The wound measures 0cm length x 0cm width x 0cm depth; 0cm^2 area and 0cm^3 volume. There is a medium amount of serosanguineous drainage noted. Wound #8 status is Healed - Epithelialized. Original cause of wound was Trauma. The date acquired was: 05/19/2021. The wound has been in treatment 2 weeks. The wound is located on the Right,Posterior Elbow. The wound measures 0cm length x 0cm width x 0cm depth; 0cm^2 area and 0cm^3 volume. There is Fat Layer (Subcutaneous Tissue) exposed. There is no tunneling or undermining noted. There is a medium amount of serosanguineous drainage noted. There is no granulation within the wound bed. There is a large (67-100%) amount of necrotic tissue within the wound bed including Eschar. Wound #9 status is Open. Original cause of wound was Trauma. The date acquired was: 05/15/2021. The wound has been in treatment 2 weeks. The wound is located on the Right Lower Leg. The wound measures 1.8cm length x 1.4cm width x 0.1cm depth; 1.979cm^2 area and 0.198cm^3 volume. There is Fat Layer (Subcutaneous Tissue) exposed. There is no tunneling or undermining noted. There is a medium amount of serosanguineous drainage noted. There is medium (34-66%) red granulation within the wound bed. There is a medium (34-66%) amount of necrotic tissue within the wound bed including Eschar and Adherent Slough. Assessment Active Problems ICD-10 Type 2 diabetes mellitus with other skin  ulcer Non-pressure chronic ulcer of right ankle with fat layer exposed Abrasion, right knee, initial encounter Laceration without foreign body of right wrist, initial encounter Chronic kidney disease, stage 4 (severe) Other iron deficiency anemias Plan Follow-up Appointments: Return Appointment in 2 weeks. Home Health: [place], [place], [place], [place] for wound care. May utilize formulary equivalent dressing for wound treatment orders unless otherwise specified. Home Health Nurse may visit PRN to address patient s wound care needs. - Three times weekly Scheduled days for dressing changes to be completed; exception, patient has scheduled wound care visit that day. **Please direct any NON-WOUND related issues/requests for orders to patient's Primary Care Physician. **If current dressing causes regression in wound condition, may D/C ordered dressing product/s and apply Normal Saline Moist Dressing daily until next [place] or Other MD appointment. **Notify [facility] of regression in wound condition at [phone]. Bathing/ Shower/ Hygiene: May shower; gently cleanse wound with antibacterial soap, rinse and pat dry prior to dressing wounds - KEEP DRESSING DRY OR CHANGE AFTER SHOWER No tub bath. Edema Control - Lymphedema / Segmental Compressive Device / Other: Patient to wear own compression stockings. Remove compression stockings every night before going to bed and put on every morning when getting up. Elevate, Exercise Daily and Avoid Standing for Long Periods of Time. Clayton [name], Clayton [name] ([id number]) Elevate legs to the level of the heart and  pump ankles as often as possible Elevate leg(s) parallel to the floor when sitting. DO YOUR BEST to sleep in the bed at night. DO NOT sleep in your recliner. Long hours of sitting in a recliner leads to swelling of the legs and/or potential wounds on  your backside. Additional Orders / Instructions: Follow Nutritious Diet and Increase Protein Intake Other: - Due to multiple falls, patient need assistance when moving about. WOUND #1: - Ankle Wound Laterality: Right, Lateral Cleanser: Soap and Water (Home Health) 3 x Per Week/30 Days Discharge Instructions: Gently cleanse wound with antibacterial soap, rinse and pat dry prior to dressing wounds Cleanser: Wound Cleanser ([place]) 3 x Per Week/30 Days Discharge Instructions: Wash your hands with soap and water. Remove old dressing, discard into plastic bag and place into trash. Cleanse the wound with Wound Cleanser prior to applying a clean dressing using gauze sponges, not tissues or cotton balls. Do not scrub or use excessive force. Pat dry using gauze sponges, not tissue or cotton balls. Primary Dressing: Xeroform-HBD 2x2 (in/in) 3 x Per Week/30 Days Discharge Instructions: Apply Xeroform-HBD 2x2 (in/in) as directed Secondary Dressing: Zetuvit Plus Silicone Border Dressing 4x4 (in/in) (Home Health) (Generic) 3 x Per Week/30 Days WOUND #11: - Forearm Wound Laterality: Right, Distal Cleanser: Soap and Water (Home Health) 3 x Per Week/30 Days Discharge Instructions: Gently cleanse wound with antibacterial soap, rinse and pat dry prior to dressing wounds Cleanser: Wound Cleanser ([place]) 3 x Per Week/30 Days Discharge Instructions: Wash your hands with soap and water. Remove old dressing, discard into plastic bag and place into trash. Cleanse the wound with Wound Cleanser prior to applying a clean dressing using gauze sponges, not tissues or cotton balls. Do not scrub or use excessive force. Pat dry using gauze sponges, not tissue or cotton balls. Primary Dressing: Xeroform-HBD 2x2 (in/in) (Home Health) 3 x Per Week/30 Days Discharge Instructions: Apply Xeroform-HBD 2x2 (in/in) as directed Secondary Dressing: ABD Pad 5x9 (in/in) (Home Health) 3 x Per Week/30 Days Discharge  Instructions: Cover with ABD pad Secured With: Kerlix Roll Sterile or Non-Sterile 6-ply 4.5x4 (yd/yd) ([place]) 3 x Per Week/30 Days Discharge Instructions: Apply Kerlix as directed Secured With: stretch net (Home Health) 3 x Per Week/30 Days Discharge Instructions: to hold dressing in place. WOUND #9: - Lower Leg Wound Laterality: Right Cleanser: Soap and Water (Home Health) 3 x Per Week/30 Days Discharge Instructions: Gently cleanse wound with antibacterial soap, rinse and pat dry prior to dressing wounds Cleanser: Wound Cleanser ([place]) 3 x Per Week/30 Days Discharge Instructions: Wash your hands with soap and water. Remove old dressing, discard into plastic bag and place into trash. Cleanse the wound with Wound Cleanser prior to applying a clean dressing using gauze sponges, not tissues or cotton balls. Do not scrub or use excessive force. Pat dry using gauze sponges, not tissue or cotton balls. Primary Dressing: Xeroform-HBD 2x2 (in/in) 3 x Per Week/30 Days Discharge Instructions: Apply Xeroform-HBD 2x2 (in/in) as directed Secondary Dressing: Zetuvit Plus Silicone Border Dressing 4x4 (in/in) (Home Health) (Generic) 3 x Per Week/30 Days 1. Would recommend currently that we going continue with Xeroform to all wounds at this point. 2. Would continue Zetuvit dressings on the knee and ankle on the right lower extremity and will use an ABD pad and stretch net on the wrist. 3. Also did recommend the patient should be extremely cautious he should not be standing on his own and walking he should have someone around. I also  think that he really needs something to protect from bumps and hits and I recommended derma saver for him. His daughter is in a look into getting those. We will see patient back for reevaluation in 2 weeks here in the clinic. If anything worsens or changes patient will contact our office for additional recommendations. Electronic Signature(s) Signed: 06/05/2021  5:27:59 PM By: Worthy Keeler PA-C Entered By: Worthy Keeler on 06/05/2021 17:27:59 Clayton [name] ([id number]) -------------------------------------------------------------------------------- SuperBill Details Patient Name: Clayton [name] Date of Service: 06/05/2021 Medical Record Number: [id number] Patient Account Number: [id number] Date of Birth/Sex: 1927-10-11 (87 y.o. M) Treating RN: Donnamarie Poag Primary Care Provider: Lujean Amel Other Clinician: Referring Provider: Dorthy Cooler, [place] Treating Provider/Extender: Skipper Cliche in Treatment: 6 Diagnosis Coding ICD-10 Codes Code Description E11.622 Type 2 diabetes mellitus with other skin ulcer L97.312 Non-pressure chronic ulcer of right ankle with fat layer exposed S80.211A Abrasion, right knee, initial encounter S61.511A Laceration without foreign body of right wrist, initial encounter N18.4 Chronic kidney disease, stage 4 (severe) 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 Facility Procedures CPT4 Code: [id number] Description: [phone] - WOUND CARE VISIT-LEV 5 EST PT Modifier: Quantity: 1 Physician Procedures CPT4 Code: [id number] Description: 95284 - WC PHYS LEVEL 4 - EST PT Modifier: Quantity: 1 CPT4 Code: Description: ICD-10 Diagnosis Description E11.622 Type 2 diabetes mellitus with other skin ulcer L97.312 Non-pressure chronic ulcer of right ankle with fat layer exposed S80.211A Abrasion, right knee, initial encounter S61.511A Laceration without foreign  body of right wrist, initial encounter Modifier: Quantity: Electronic Signature(s) Signed: 06/05/2021 5:30:00 PM By: Worthy Keeler PA-C Entered By: Worthy Keeler on 06/05/2021 17:29:59

## 2021-06-13 ENCOUNTER — Telehealth: Payer: Self-pay | Admitting: Hematology and Oncology

## 2021-06-13 NOTE — Telephone Encounter (Signed)
Scheduled appointment per 12/19 scheduling message. Left message.

## 2021-06-15 ENCOUNTER — Telehealth: Payer: Self-pay | Admitting: Hematology and Oncology

## 2021-06-15 NOTE — Telephone Encounter (Signed)
Patient will be mailed updated calendar.  °

## 2021-06-20 ENCOUNTER — Other Ambulatory Visit: Payer: Self-pay

## 2021-06-20 ENCOUNTER — Encounter: Payer: HMO | Attending: Physician Assistant | Admitting: Physician Assistant

## 2021-06-20 DIAGNOSIS — I2510 Atherosclerotic heart disease of native coronary artery without angina pectoris: Secondary | ICD-10-CM | POA: Insufficient documentation

## 2021-06-20 DIAGNOSIS — N184 Chronic kidney disease, stage 4 (severe): Secondary | ICD-10-CM | POA: Insufficient documentation

## 2021-06-20 DIAGNOSIS — E1122 Type 2 diabetes mellitus with diabetic chronic kidney disease: Secondary | ICD-10-CM | POA: Diagnosis not present

## 2021-06-20 DIAGNOSIS — X58XXXA Exposure to other specified factors, initial encounter: Secondary | ICD-10-CM | POA: Diagnosis not present

## 2021-06-20 DIAGNOSIS — I129 Hypertensive chronic kidney disease with stage 1 through stage 4 chronic kidney disease, or unspecified chronic kidney disease: Secondary | ICD-10-CM | POA: Diagnosis not present

## 2021-06-20 DIAGNOSIS — L97312 Non-pressure chronic ulcer of right ankle with fat layer exposed: Secondary | ICD-10-CM | POA: Diagnosis not present

## 2021-06-20 DIAGNOSIS — S80211A Abrasion, right knee, initial encounter: Secondary | ICD-10-CM | POA: Insufficient documentation

## 2021-06-20 DIAGNOSIS — E1151 Type 2 diabetes mellitus with diabetic peripheral angiopathy without gangrene: Secondary | ICD-10-CM | POA: Insufficient documentation

## 2021-06-20 DIAGNOSIS — E11622 Type 2 diabetes mellitus with other skin ulcer: Secondary | ICD-10-CM | POA: Insufficient documentation

## 2021-06-20 DIAGNOSIS — D508 Other iron deficiency anemias: Secondary | ICD-10-CM | POA: Diagnosis not present

## 2021-06-20 NOTE — Progress Notes (Signed)
AMBER, GUTHRIDGE ([id number]) Visit Report for 06/20/2021 Arrival Information Details Patient Name: Clayton [name]. Date of Service: 06/20/2021 9:45 AM Medical Record Number: [id number] Patient Account Number: [id number] Date of Birth/Sex: 01-25-1928 (88 y.o. M) Treating RN: Clayton [name] Primary Care Clayton [name]: Clayton [name], Clayton [name] Other Clinician: Referring Clayton [name]: Clayton [name], Clayton [name] Treating Clayton [name]/Extender: Clayton [name] in Treatment: 8 Visit Information History Since Last Visit Added or deleted any medications: No Patient Arrived: Wheel Chair Any new allergies or adverse reactions: No Arrival Time: 09:52 Had a fall or experienced change in No Accompanied By: daughter activities of daily living that may affect Transfer Assistance: EasyPivot Patient Lift risk of falls: Patient Identification Verified: Yes Hospitalized since last visit: No Secondary Verification Process Yes Has Dressing in Place as Prescribed: Yes Completed: Pain Present Now: No Patient Requires Transmission-Based No Precautions: Patient Has Alerts: Yes Patient Alerts: Patient on Blood Thinner Type II Diabetic 57m aspirin Non-compressible >220 Lives HARMONY _0  ALF Electronic Signature(s) Signed: 06/20/2021 4:14:32 PM By: GLevora DredgeEntered By: GLevora Dredgeon 06/20/2021 09:58:01 Clayton [name]([id number] -------------------------------------------------------------------------------- Clinic Level of Care Assessment Details Patient Name: Clayton [name] Date of Service: 06/20/2021 9:45 AM Medical Record Number: [id number] Account Number: [id number] of Birth/Sex: 8Apr 17, 1929(88 y.o. M) Treating RN: GLevora DredgePrimary Care Clayton [name]: KDorthy [name] Clayton [name] Other Clinician: Referring Clayton [name]: KDorthy [name] Clayton [name] Treating Clayton [name]/Extender: SSkipper Clichein Treatment: 8 Clinic Level of Care Assessment Items TOOL 4 Quantity Score _1  - Use when only an EandM is performed on  FOLLOW-UP visit 0 ASSESSMENTS - Nursing Assessment / Reassessment X - Reassessment of Co-morbidities (includes updates in patient status) 1 10 _2  - 0 Reassessment of Adherence to Treatment Plan ASSESSMENTS - Wound and Skin Assessment / Reassessment X - Simple Wound Assessment / Reassessment - one wound 1 5 _3  - 0 Complex Wound Assessment / Reassessment - multiple wounds _4  - 0 Dermatologic / Skin Assessment (not related to wound area) ASSESSMENTS - Focused Assessment _5  - Circumferential Edema Measurements - multi extremities 0 _6  - 0 Nutritional Assessment / Counseling / Intervention _7  - 0 Lower Extremity Assessment (monofilament, tuning fork, pulses) _8  - 0 Peripheral Arterial Disease Assessment (using hand held doppler) ASSESSMENTS - Ostomy and/or Continence Assessment and Care _9  - Incontinence Assessment and Management 0 _10  - 0 Ostomy Care Assessment and Management (repouching, etc.) PROCESS - Coordination of Care X - Simple Patient / Family Education for ongoing care 1 15 _11  - 0 Complex (extensive) Patient / Family Education for ongoing care _12  - 0 Staff obtains CProgrammer, systems Records, Test Results / Process Orders _13  - 0 Staff telephones HHA, Nursing Homes / Clarify orders / etc _14  - 0 Routine Transfer to another Facility (non-emergent condition) _15  - 0 Routine Hospital Admission (non-emergent condition) _16  - 0 New Admissions / IBiomedical engineer/ Ordering NPWT, Apligraf, etc. _17  - 0 Emergency Hospital Admission (emergent condition) X- 1 10 Simple Discharge Coordination _18  - 0 Complex (extensive) Discharge Coordination PROCESS - Special Needs _19  - Pediatric / Minor Patient Management 0 _20  - 0 Isolation Patient Management _21  - 0 Hearing / Language / Visual special needs _22  - 0 Assessment of Community assistance (transportation, D/C planning, etc.) _23  - 0 Additional assistance / Altered mentation _24  - 0 Support Surface(s) Assessment (bed, cushion, seat,  etc.) INTERVENTIONS - Wound Cleansing / Measurement Clayton [name]([id number] X- 1 5 Simple Wound Cleansing - one wound _25  - 0 Complex Wound Cleansing - multiple wounds X- 1 5 Wound Imaging (photographs -  any number of wounds) _0  - 0 Wound Tracing (instead of photographs) X- 1 5 Simple Wound Measurement - one wound _1  - 0 Complex Wound Measurement - multiple wounds INTERVENTIONS - Wound Dressings X - Small Wound Dressing one or multiple wounds 1 10 _2  - 0 Medium Wound Dressing one or multiple wounds _3  - 0 Large Wound Dressing one or multiple wounds <OZDGUYQIHKVQQVZD>_6<\/LOVFIEPPIRJJOACZ>_6  - 0 Application of Medications - topical <SAYTKZSWFUXNATFT>_7<\/DUKGURKYHCWCBJSE>_8  - 0 Application of Medications - injection INTERVENTIONS - Miscellaneous _6  - External ear exam 0 _7  - 0 Specimen Collection (cultures, biopsies, blood, body fluids, etc.) _8  - 0 Specimen(s) / Culture(s) sent or taken to Lab for analysis _9  - 0 Patient Transfer (multiple staff / Civil Service fast streamer / Similar devices) _10  - 0 Simple Staple / Suture removal (25 or less) _11  - 0 Complex Staple / Suture removal (26 or more) _12  - 0 Hypo / Hyperglycemic Management (close monitor of Blood Glucose) _13  - 0 Ankle / Brachial Index (ABI) - do not check if billed separately X- 1 5 Vital Signs Has the patient been seen at the hospital within the last three years: Yes Total Score: 70 Level Of Care: New/Established - Level 2 Electronic Signature(s) Signed: 06/20/2021 4:14:32 PM By: Clayton [name] Entered By: Clayton [name] on 06/20/2021 10:36:15 Clayton [name] ([id number]) -------------------------------------------------------------------------------- Encounter Discharge Information Details Patient Name: Clayton [name]. Date of Service: 06/20/2021 9:45 AM Medical Record Number: [id number] Patient Account Number: [id number] Date of Birth/Sex: [birth year] (88 y.o. M) Treating RN: Clayton [name] Primary Care Sareena Odeh: Clayton [name], Clayton [name] Other Clinician: Referring Tanijah Morais: Clayton [name],  Clayton [name] Treating Conor Filsaime/Extender: Clayton [name] in Treatment: 8 Encounter Discharge Information Items Discharge Condition: Stable Ambulatory Status: Wheelchair Discharge Destination: [place] Transportation: Private Auto Accompanied By: daughter Schedule Follow-up Appointment: Yes Clinical Summary of Care: Electronic Signature(s) Signed: 06/20/2021 4:14:32 PM By: Clayton [name] Entered By: Clayton [name] on 06/20/2021 10:39:17 Clayton [name] ([id number]) -------------------------------------------------------------------------------- Lower Extremity Assessment Details Patient Name: Clayton [name]. Date of Service: 06/20/2021 9:45 AM Medical Record Number: [id number] Patient Account Number: [id number] Date of Birth/Sex: August 28, 1927 (88 y.o. M) Treating RN: Clayton [name] Primary Care Genise Strack: Clayton [name], Clayton [name] Other Clinician: Referring Dymond Gutt: Clayton [name], Clayton [name] Treating Camreigh Michie/Extender: Clayton [name] in Treatment: 8 Edema Assessment Assessed: [Left: No] [Right: No] Edema: [Left: Ye] [Right: s] Calf Left: Right: Point of Measurement: 34 cm From Medial Instep 37.2 cm Ankle Left: Right: Point of Measurement: 10 cm From Medial Instep 26 cm Vascular Assessment Pulses: Dorsalis Pedis Palpable: [Right:Yes] Electronic Signature(s) Signed: 06/20/2021 4:14:32 PM By: Clayton [name] Entered By: Clayton [name] on 06/20/2021 10:15:25 Clayton [name] ([id number]) -------------------------------------------------------------------------------- Multi Wound Chart Details Patient Name: Clayton [name]. Date of Service: 06/20/2021 9:45 AM Medical Record Number: [id number] Patient Account Number: [id number] Date of Birth/Sex: [birth year] (88 y.o. M) Treating RN: Clayton [name] Primary Care Pharaoh Pio: Clayton [name], Clayton [name] Other Clinician: Referring Shalayne Leach: Clayton [name], Clayton [name] Treating Ruston Fedora/Extender: Clayton [name] in Treatment: 8 Vital Signs Height(in):  4 Pulse(bpm): 84 Weight(lbs): 198 Blood Pressure(mmHg): 148/60 Body Mass Index(BMI): 29 Temperature(F): 97.6 Respiratory Rate(breaths/min): 18 Photos: Wound Location: Right, Lateral Ankle Right, Distal Forearm Right Lower Leg Wounding Event: Gradually Appeared Skin Tear/Laceration Trauma Primary Etiology: Inflammatory Skin Tear Trauma, Other Comorbid History: Cataracts, Anemia, Chronic Cataracts, Anemia, Chronic Cataracts, Anemia, Chronic Obstructive Pulmonary Disease Obstructive Pulmonary Disease Obstructive Pulmonary Disease (COPD), Sleep Apnea, Arrhythmia, (COPD), Sleep Apnea, Arrhythmia, (COPD), Sleep Apnea, Arrhythmia, Coronary Artery Disease, Coronary Artery Disease, Coronary Artery Disease, Myocardial Infarction, Type II Myocardial Infarction, Type II Myocardial Infarction,  Type II Diabetes, Osteomyelitis Diabetes, Osteomyelitis Diabetes, Osteomyelitis Date Acquired: 12/26/2020 05/29/2021 05/15/2021 Weeks of Treatment: _0 Wound Status: Open Open Open Measurements L x W x D (cm) 1.5x0.8x0.3 0.1x0.1x0.1 0.1x0.1x0.1 Area (cm) : 0.942 0.008 0.008 Volume (cm) : 0.283 0.001 0.001 % Reduction in Area: 29.90% 99.70% 99.90% % Reduction in Volume: 47.30% 99.60% 99.90% Classification: Full Thickness Without Exposed Full Thickness Without Exposed Full Thickness Without Exposed Support Structures Support Structures Support Structures Exudate Amount: Medium None Present None Present Exudate Type: Serosanguineous N/A N/A Exudate Color: red, brown N/A N/A Wound Margin: Well defined, not attached N/A N/A Granulation Amount: Medium (34-66%) None Present (0%) None Present (0%) Granulation Quality: Red, Hyper-granulation N/A N/A Necrotic Amount: Medium (34-66%) None Present (0%) None Present (0%) Exposed Structures: Fat Layer (Subcutaneous Tissue): Fascia: No Fascia: No Yes Fat Layer (Subcutaneous Tissue): Fat Layer (Subcutaneous Tissue): Fascia: No No No Tendon: No Tendon:  No Tendon: No Muscle: No Muscle: No Muscle: No Joint: No Joint: No Joint: No Bone: No Bone: No Bone: No Epithelialization: None Large (67-100%) Large (67-100%) Treatment Notes Electronic Signature(s) Signed: 06/20/2021 4:14:32 PM By: Clayton [name] Entered By: Clayton [name] on 06/20/2021 10:21:17 Clayton [name] ([id number]) Clayton [name] ([id number]) -------------------------------------------------------------------------------- Multi-Disciplinary Care Plan Details Patient Name: LLOYDE, LUDLAM. Date of Service: 06/20/2021 9:45 AM Medical Record Number: [id number] Patient Account Number: [id number] Date of Birth/Sex: 09/11/1927 (88 y.o. M) Treating RN: Clayton [name] Primary Care Orian Amberg: Clayton [name], Clayton [name] Other Clinician: Referring Tremayne Sheldon: Clayton [name], Clayton [name] Treating Manilla Strieter/Extender: Clayton [name] in Treatment: 8 Active Inactive Necrotic Tissue Nursing Diagnoses: Impaired tissue integrity related to necrotic/devitalized tissue Knowledge deficit related to management of necrotic/devitalized tissue Goals: Necrotic/devitalized tissue will be minimized in the wound bed Date Initiated: 04/24/2021 Target Resolution Date: 05/24/2021 Goal Status: Active Patient/caregiver will verbalize understanding of reason and process for debridement of necrotic tissue Date Initiated: 04/24/2021 Date Inactivated: 05/08/2021 Target Resolution Date: 05/24/2021 Goal Status: Met Interventions: Assess patient pain level pre-, during and post procedure and prior to discharge Provide education on necrotic tissue and debridement process Treatment Activities: Apply topical anesthetic as ordered : 04/24/2021 Excisional debridement : 04/24/2021 Notes: Soft Tissue Infection Nursing Diagnoses: Impaired tissue integrity Knowledge deficit related to disease process and management Knowledge deficit related to home infection control: handwashing, handling of soiled dressings, supply  storage Potential for infection: soft tissue Goals: Signs and symptoms of infection will be recognized early to allow for prompt treatment Date Initiated: 04/24/2021 Target Resolution Date: 05/25/2021 Goal Status: Active Interventions: Assess signs and symptoms of infection every visit Notes: Wound/Skin Impairment Nursing Diagnoses: Impaired tissue integrity Goals: Patient/caregiver will verbalize understanding of skin care regimen Date Initiated: 04/24/2021 Date Inactivated: 05/08/2021 Target Resolution Date: 04/24/2021 Goal Status: Met Ulcer/skin breakdown will have a volume reduction of 30% by week 4 Date Initiated: 04/24/2021 Target Resolution Date: 05/24/2021 Goal Status: Active RAHUL, MALINAK ([id number]) Interventions: Assess ulceration(s) every visit Treatment Activities: Skin care regimen initiated : 04/24/2021 Topical wound management initiated : 04/24/2021 Notes: Electronic Signature(s) Signed: 06/20/2021 4:14:32 PM By: Clayton [name] Entered By: Clayton [name] on 06/20/2021 10:20:57 Clayton [name] ([id number]) -------------------------------------------------------------------------------- Pain Assessment Details Patient Name: Clayton [name]. Date of Service: 06/20/2021 9:45 AM Medical Record Number: [id number] Patient Account Number: [id number] Date of Birth/Sex: [birth year] (88 y.o. M) Treating RN: Clayton [name] Primary Care Dannie Hattabaugh: Clayton [name], Clayton [name] Other Clinician: Referring Cordie Beazley: Clayton [name], Clayton [name] Treating [place] Doig/Extender: Clayton [name] in Treatment: 8 Active Problems Location of Pain Severity and Description of Pain Patient Has Paino  No Site Locations Rate the pain. Current Pain Level: 0 Pain Management and Medication Current Pain Management: Electronic Signature(s) Signed: 06/20/2021 4:14:32 PM By: Clayton [name] Entered By: Clayton [name] on 06/20/2021 10:00:53 Clayton [name]  ([id number]) -------------------------------------------------------------------------------- Patient/Caregiver Education Details Patient Name: Clayton [name] Date of Service: 06/20/2021 9:45 AM Medical Record Number: [id number] Patient Account Number: [id number] Date of Birth/Gender: [birth year] (88 y.o. M) Treating RN: Clayton [name] Primary Care Physician: Clayton [name], Clayton [name] Other Clinician: Referring Physician: Dorthy [name], Clayton [name] Treating Physician/Extender: Clayton [name] in Treatment: 8 Education Assessment Education Provided To: Patient and Caregiver Education Topics Provided Wound/Skin Impairment: Handouts: Caring for Your Ulcer Methods: Explain/Verbal Responses: State content correctly Electronic Signature(s) Signed: 06/20/2021 4:14:32 PM By: Clayton [name] Entered By: Clayton [name] on 06/20/2021 10:37:01 Clayton [name] ([id number]) -------------------------------------------------------------------------------- Wound Assessment Details Patient Name: Clayton [name]. Date of Service: 06/20/2021 9:45 AM Medical Record Number: [id number] Patient Account Number: [id number] Date of Birth/Sex: [birth year] (88 y.o. M) Treating RN: Clayton [name] Primary Care Tevin Shillingford: Clayton [name], Clayton [name] Other Clinician: Referring Kasarah Sitts: Clayton [name], Clayton [name] Treating Keelon Zurn/Extender: Clayton [name] in Treatment: 8 Wound Status Wound Number: 1 Primary Inflammatory Etiology: Wound Location: Right, Lateral Ankle Wound Open Wounding Event: Gradually Appeared Status: Date Acquired: 12/26/2020 Comorbid Cataracts, Anemia, Chronic Obstructive Pulmonary Disease Weeks Of Treatment: 8 History: (COPD), Sleep Apnea, Arrhythmia, Coronary Artery Clustered Wound: No Disease, Myocardial Infarction, Type II Diabetes, Osteomyelitis Photos Wound Measurements Length: (cm) 1.5 Width: (cm) 0.8 Depth: (cm) 0.3 Area: (cm) 0.942 Volume: (cm) 0.283 % Reduction in Area: 29.9% % Reduction in Volume:  47.3% Epithelialization: None Tunneling: No Undermining: No Wound Description Classification: Full Thickness Without Exposed Support Structu Wound Margin: Well defined, not attached Exudate Amount: Medium Exudate Type: Serosanguineous Exudate Color: red, brown res Foul Odor After Cleansing: No Slough/Fibrino Yes Wound Bed Granulation Amount: Medium (34-66%) Exposed Structure Granulation Quality: Red, Hyper-granulation Fascia Exposed: No Necrotic Amount: Medium (34-66%) Fat Layer (Subcutaneous Tissue) Exposed: Yes Necrotic Quality: Adherent Slough Tendon Exposed: No Muscle Exposed: No Joint Exposed: No Bone Exposed: No Treatment Notes Wound #1 (Ankle) Wound Laterality: Right, Lateral Cleanser Soap and Water Discharge Instruction: Gently cleanse wound with antibacterial soap, rinse and pat dry prior to dressing wounds BREWER, HITCHMAN ([id number]) Wound Cleanser Discharge Instruction: Wash your hands with soap and water. Remove old dressing, discard into plastic bag and place into trash. Cleanse the wound with Wound Cleanser prior to applying a clean dressing using gauze sponges, not tissues or cotton balls. Do not scrub or use excessive force. Pat dry using gauze sponges, not tissue or cotton balls. Peri-Wound Care Topical Primary Dressing Xeroform-HBD 2x2 (in/in) Discharge Instruction: Apply Xeroform-HBD 2x2 (in/in) as directed Secondary Dressing Zetuvit Plus Silicone Border Dressing 4x4 (in/in) Secured With Compression Wrap Compression Stockings Add-Ons Electronic Signature(s) Signed: 06/20/2021 4:14:32 PM By: Clayton [name] Entered By: Clayton [name] on 06/20/2021 10:10:07 Clayton [name] ([id number]) -------------------------------------------------------------------------------- Wound Assessment Details Patient Name: Clayton [name]. Date of Service: 06/20/2021 9:45 AM Medical Record Number: [id number] Patient Account Number: [id number] Date of Birth/Sex:  24-May-1928 (88 y.o. M) Treating RN: Clayton [name] Primary Care Moreen Piggott: Clayton [name], Clayton [name] Other Clinician: Referring Avery Eustice: Clayton [name], Clayton [name] Treating Aaryana Betke/Extender: Clayton [name] in Treatment: 8 Wound Status Wound Number: 11 Primary Skin Tear Etiology: Wound Location: Right, Distal Forearm Wound Healed - Epithelialized Wounding Event: Skin Tear/Laceration Status: Date Acquired: 05/29/2021 Comorbid Cataracts, Anemia, Chronic Obstructive Pulmonary Disease Weeks Of Treatment: 2 History: (COPD), Sleep Apnea, Arrhythmia, Coronary Artery Clustered Wound: No Disease, Myocardial Infarction, Type II Diabetes, Osteomyelitis  Photos Wound Measurements Length: (cm) 0 Width: (cm) 0 Depth: (cm) 0 Area: (cm) 0 Volume: (cm) 0 % Reduction in Area: 100% % Reduction in Volume: 100% Epithelialization: Large (67-100%) Tunneling: No Undermining: No Wound Description Classification: Full Thickness Without Exposed Support Structure Exudate Amount: None Present s Foul Odor After Cleansing: No Slough/Fibrino No Wound Bed Granulation Amount: None Present (0%) Exposed Structure Necrotic Amount: None Present (0%) Fascia Exposed: No Fat Layer (Subcutaneous Tissue) Exposed: No Tendon Exposed: No Muscle Exposed: No Joint Exposed: No Bone Exposed: No Treatment Notes Wound #11 (Forearm) Wound Laterality: Right, Distal Cleanser Peri-Wound Care Topical Primary Dressing BLAZE, NYLUND ([id number]) Secondary Dressing Secured With Compression Wrap Compression Stockings Add-Ons Electronic Signature(s) Signed: 06/20/2021 4:14:32 PM By: Clayton [name] Entered By: Clayton [name] on 06/20/2021 10:24:23 Clayton [name] ([id number]) -------------------------------------------------------------------------------- Wound Assessment Details Patient Name: Clayton [name]. Date of Service: 06/20/2021 9:45 AM Medical Record Number: [id number] Patient Account Number: [id number] Date of  Birth/Sex: June 30, 1927 (88 y.o. M) Treating RN: Clayton [name] Primary Care Kendel Bessey: Clayton [name], Clayton [name] Other Clinician: Referring Jonel Sick: Clayton [name], Clayton [name] Treating Cloa Bushong/Extender: Clayton [name] in Treatment: 8 Wound Status Wound Number: 9 Primary Trauma, Other Etiology: Wound Location: Right Lower Leg Wound Healed - Epithelialized Wounding Event: Trauma Status: Date Acquired: 05/15/2021 Comorbid Cataracts, Anemia, Chronic Obstructive Pulmonary Disease Weeks Of Treatment: 4 History: (COPD), Sleep Apnea, Arrhythmia, Coronary Artery Clustered Wound: No Disease, Myocardial Infarction, Type II Diabetes, Osteomyelitis Photos Wound Measurements Length: (cm) 0 Width: (cm) 0 Depth: (cm) 0 Area: (cm) 0 Volume: (cm) 0 % Reduction in Area: 100% % Reduction in Volume: 100% Epithelialization: Large (67-100%) Tunneling: No Undermining: No Wound Description Classification: Full Thickness Without Exposed Support Structure Exudate Amount: None Present s Foul Odor After Cleansing: No Slough/Fibrino No Wound Bed Granulation Amount: None Present (0%) Exposed Structure Necrotic Amount: None Present (0%) Fascia Exposed: No Fat Layer (Subcutaneous Tissue) Exposed: No Tendon Exposed: No Muscle Exposed: No Joint Exposed: No Bone Exposed: No Treatment Notes Wound #9 (Lower Leg) Wound Laterality: Right Cleanser Peri-Wound Care Topical Primary Dressing BURWELL, BETHEL ([id number]) Secondary Dressing Secured With Compression Wrap Compression Stockings Add-Ons Electronic Signature(s) Signed: 06/20/2021 4:14:32 PM By: Clayton [name] Entered By: Clayton [name] on 06/20/2021 10:24:24 Clayton [name] ([id number]) -------------------------------------------------------------------------------- Vitals Details Patient Name: Clayton [name]. Date of Service: 06/20/2021 9:45 AM Medical Record Number: [id number] Patient Account Number: [id number] Date of Birth/Sex: 14-May-1928 (88 y.o.  M) Treating RN: Clayton [name] Primary Care Hartlyn Reigel: Clayton [name], Clayton [name] Other Clinician: Referring Kenidee Cregan: Clayton [name], Clayton [name] Treating Barney Gertsch/Extender: Clayton [name] in Treatment: 8 Vital Signs Time Taken: 09:59 Temperature (F): 97.6 Height (in): 69 Pulse (bpm): 84 Weight (lbs): 198 Respiratory Rate (breaths/min): 18 Body Mass Index (BMI): 29.2 Blood Pressure (mmHg): 148/60 Reference Range: 80 - 120 mg / dl Electronic Signature(s) Signed: 06/20/2021 4:14:32 PM By: Clayton [name] Entered By: Clayton [name] on 06/20/2021 10:00:42

## 2021-06-20 NOTE — Progress Notes (Addendum)
Clayton [name], Clayton [name] ([id number]) Visit Report for 06/20/2021 Chief Complaint Document Details Patient Name: Clayton [name], Clayton [name]. Date of Service: 06/20/2021 9:45 AM Medical Record Number: [id number] Patient Account Number: [id number] Date of Birth/Sex: Aug 21, 1927 (88 y.o. M) Treating RN: Levora Dredge Primary Care Provider: Dorthy Cooler, Dibas Other Clinician: Referring Provider: Dorthy Cooler, Dibas Treating Provider/Extender: Skipper Cliche in Treatment: 8 Information Obtained from: Patient Chief Complaint Right ankle, right knee, and right wrist ulcers Electronic Signature(s) Signed: 06/20/2021 10:02:52 AM By: Worthy Keeler PA-C Entered By: Worthy Keeler on 06/20/2021 10:02:52 Clayton [name] ([id number]) -------------------------------------------------------------------------------- HPI Details Patient Name: Clayton [name] Date of Service: 06/20/2021 9:45 AM Medical Record Number: [id number] Patient Account Number: [id number] Date of Birth/Sex: Apr 02, 1928 (88 y.o. M) Treating RN: Levora Dredge Primary Care Provider: Dorthy Cooler, Dibas Other Clinician: Referring Provider: Dorthy Cooler, Dibas Treating Provider/Extender: Skipper Cliche in Treatment: 8 History of Present Illness HPI Description: 04/24/2021 upon evaluation today patient presents for initial inspection here in the clinic concerning an issue that has been going on with wounds on the right lateral ankle region, left lower leg, and left gluteal location. Unfortunately the patient does have significant anemia and had a blood transfusion in September. He has been having erythropoietin shots and lives at [facility] in [place]. Most of these wounds are actually stated to be trauma/laceration in nature. The patient does have diabetes mellitus type 2, chronic kidney disease stage IV, and iron deficiency anemia. 05/08/2021 upon evaluation today patient's left leg actually appears to be doing pretty well with just a couple dry areas at  this point there is no real need for any specific dressings at this point in my opinion. I think the biggest thing is just a compression sock and keeping this area as dry as possible and from swelling. In regard to the right leg there does appear to be some evidence of cellulitis which has been a little bit more concerned here. Is a little warm to touch not sure if this is just inflammation due to venous stasis or if there is actual infection here again there is really not a good area to culture although the good news is the patient does seem to be doing quite well with regard to the healing in this ankle region. 05/22/2021 upon evaluation today patient appears to be doing somewhat poorly in regard to his wounds. He has been tolerating the dressing changes in general and I think the ankle is doing okay though a little bit hypergranular. In regard to the remaining wounds most of these were all new as result of skin tears from falls that he has been having frequently. He is currently at [facility] in [place]. This is an assisted living. With that being said I am beginning to wonder whether assisted living is really the proper placement for him to be honest. 06/05/2021 upon evaluation today patient unfortunately is having some issues here with his right wrist he has a skin tear. He also has some issues when it comes down to his right knee and his right ankle both of which appear to be doing much better however compared to last visit. Right now I think the biggest issue is the new skin tear on the wrist which occurred a week ago and really has not been taking care of properly. Patient's wound was not reapproximated as far as the skin that was pulled back and this may or may not hinder his healing prospects. I Georgina Peer try to pull it back down and  Steri-Stripped it down if at all possible. 06/20/2021 upon evaluation today patient appears to be doing pretty well in regard to most of his wounds. In fact  everything appears to be healed except for the right lateral ankle. Even this area appears that it may have been healed but his leg is so swollen because apparently has been refusing his Lasix that he has some weeping areas that broke loose around the wound region. Again a lot of this is scattered and difficult to even measure and quantified to be perfectly honest. Electronic Signature(s) Signed: 06/20/2021 11:10:34 AM By: Worthy Keeler PA-C Entered By: Worthy Keeler on 06/20/2021 11:10:33 Clayton [name] ([id number]) -------------------------------------------------------------------------------- Physical Exam Details Patient Name: Clayton [name] Date of Service: 06/20/2021 9:45 AM Medical Record Number: [id number] Patient Account Number: [id number] Date of Birth/Sex: 11-24-1927 (88 y.o. M) Treating RN: Levora Dredge Primary Care Provider: Dorthy Cooler, Dibas Other Clinician: Referring Provider: Dorthy Cooler, Dibas Treating Provider/Extender: Skipper Cliche in Treatment: 8 Constitutional Well-nourished and well-hydrated in no acute distress. Respiratory labored, rapid respiration. Psychiatric this patient is able to make decisions and demonstrates good insight into disease process. Alert and Oriented x 3. pleasant and cooperative. Notes Patient's wounds in general showed signs of complete epithelization. Even the area on the right lateral ankle I think was healed before his swelling got out of control due to refusal of taking the Lasix. His breathing also sounds pretty terrible today as well to be honest. Overall I think that he needs to get back on his Lasix and may even need to be that some of his other medications need to be addressed and I recommended that at the facility where he is at for assisted living that he be seen by the provider there. Electronic Signature(s) Signed: 06/20/2021 11:11:12 AM By: Worthy Keeler PA-C Entered By: Worthy Keeler on 06/20/2021 11:11:11 Clayton [name] ([id number]) -------------------------------------------------------------------------------- Physician Orders Details Patient Name: Clayton [name], Clayton [name]. Date of Service: 06/20/2021 9:45 AM Medical Record Number: [id number] Patient Account Number: [id number] Date of Birth/Sex: 1927-08-17 (88 y.o. M) Treating RN: Levora Dredge Primary Care Provider: Dorthy Cooler, Dibas Other Clinician: Referring Provider: Dorthy Cooler, Dibas Treating Provider/Extender: Skipper Cliche in Treatment: 8 Verbal / Phone Orders: No Diagnosis Coding ICD-10 Coding Code Description E11.622 Type 2 diabetes mellitus with other skin ulcer N18.4 Chronic kidney disease, stage 4 (severe) L97.312 Non-pressure chronic ulcer of right ankle with fat layer exposed 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 S80.211A Abrasion, right knee, initial encounter S61.511A Laceration without foreign body of right wrist, initial encounter Follow-up Appointments o Return Appointment in 2 weeks. [place], [place], [place], [place] for wound care. May utilize formulary equivalent dressing for wound treatment orders unless otherwise specified. Home Health Nurse may visit PRN to address patientos wound care needs. - Three times weekly o Scheduled days for dressing changes to be completed; exception, patient has scheduled wound care visit that day. o **Please direct any NON-WOUND related issues/requests for orders to patient's Primary Care Physician. **If current dressing causes regression in wound condition, may D/C ordered dressing product/s and apply Normal Saline Moist Dressing daily until next [place] or Other MD appointment. **Notify Wound Healing Center of regression in wound condition at [phone]. Bathing/ Shower/ Hygiene o May shower; gently cleanse wound with antibacterial soap, rinse and pat dry prior to dressing  wounds - KEEP DRESSING DRY OR CHANGE AFTER SHOWER o No  tub bath. Edema Control - Lymphedema / Segmental Compressive Device / Other o Patient to wear own compression stockings. Remove compression stockings every night before going to bed and put on every morning when getting up. o Elevate, Exercise Daily and Avoid Standing for Long Periods of Time. o Elevate legs to the level of the heart and pump ankles as often as possible o Elevate leg(s) parallel to the floor when sitting. o DO YOUR BEST to sleep in the bed at night. DO NOT sleep in your recliner. Long hours of sitting in a recliner leads to swelling of the legs and/or potential wounds on your backside. Additional Orders / Instructions o Follow Nutritious Diet and Increase Protein Intake o Other: - Due to multiple falls, patient need assistance when moving about. Wound Treatment Wound #1 - Ankle Wound Laterality: Right, Lateral Cleanser: Soap and Water (Home Health) 3 x Per Week/30 Days Discharge Instructions: Gently cleanse wound with antibacterial soap, rinse and pat dry prior to dressing wounds Cleanser: Wound Cleanser ([place]) 3 x Per Week/30 Days Discharge Instructions: Wash your hands with soap and water. Remove old dressing, discard into plastic bag and place into trash. Cleanse the wound with Wound Cleanser prior to applying a clean dressing using gauze sponges, not tissues or cotton balls. Do not scrub or use excessive force. Pat dry using gauze sponges, not tissue or cotton balls. Primary Dressing: Xeroform-HBD 2x2 (in/in) 3 x Per Week/30 Days Discharge Instructions: Apply Xeroform-HBD 2x2 (in/in) as directed Secondary Dressing: Zetuvit Plus Silicone Border Dressing 4x4 (in/in) ([place]) (Generic) 3 x Per Week/30 Days Clayton [name], Clayton [name] ([id number]) Electronic Signature(s) Signed: 06/20/2021 4:14:32 PM By: Levora Dredge Signed: 06/20/2021 4:50:46 PM By: Worthy Keeler PA-C Entered By: Levora Dredge on  06/20/2021 10:35:41 Clayton [name] ([id number]) -------------------------------------------------------------------------------- Problem List Details Patient Name: Clayton [name], Clayton [name]. Date of Service: 06/20/2021 9:45 AM Medical Record Number: [id number] Patient Account Number: [id number] Date of Birth/Sex: 07/14/1927 (88 y.o. M) Treating RN: Levora Dredge Primary Care Provider: Dorthy Cooler, Dibas Other Clinician: Referring Provider: Dorthy Cooler, Dibas Treating Provider/Extender: Skipper Cliche in Treatment: 8 Active Problems ICD-10 Encounter Code Description Active Date MDM Diagnosis E11.622 Type 2 diabetes mellitus with other skin ulcer 04/24/2021 No Yes N18.4 Chronic kidney disease, stage 4 (severe) 04/24/2021 No Yes L97.312 Non-pressure chronic ulcer of right ankle with fat layer exposed 04/24/2021 No Yes 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 04/24/2021 No Yes S80.211A Abrasion, right knee, initial encounter 05/22/2021 No Yes S61.511A Laceration without foreign body of right wrist, initial encounter 06/05/2021 No Yes Inactive Problems Resolved Problems ICD-10 Code Description Active Date Resolved Date L97.812 Non-pressure chronic ulcer of other part of right lower leg with fat layer 04/24/2021 04/24/2021 exposed L97.821 Non-pressure chronic ulcer of other part of left lower leg limited to breakdown 04/24/2021 04/24/2021 of skin S31.821A Laceration without foreign body of left buttock, initial encounter 04/24/2021 04/24/2021 L98.492 Non-pressure 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 04/24/2021 04/24/2021 S81.812A Laceration without foreign body, left lower leg, initial encounter 04/24/2021 04/24/2021 S50.311A Abrasion of right elbow, initial encounter 05/22/2021 05/22/2021 Clayton [name], Clayton [name] ([id number]) [phone] Abrasion of left elbow, initial encounter 05/22/2021 05/22/2021 Electronic Signature(s) Signed: 06/20/2021 10:02:47 AM By: Worthy Keeler PA-C Entered By: Worthy Keeler on  06/20/2021 10:02:47 Clayton [name] ([id number]) -------------------------------------------------------------------------------- Progress Note Details Patient Name: Clayton [name] Date of Service: 06/20/2021 9:45 AM Medical Record Number: [id number] Patient Account Number: [id number] Date of Birth/Sex: 04/14/1928 (88 y.o. M) Treating RN: Araceli Bouche,  Caitlin Primary Care Provider: Dorthy Cooler, Dibas Other Clinician: Referring Provider: Dorthy Cooler, Dibas Treating Provider/Extender: Skipper Cliche in Treatment: 8 Subjective Chief Complaint Information obtained from Patient Right ankle, right knee, and right wrist ulcers History of Present Illness (HPI) 04/24/2021 upon evaluation today patient presents for initial inspection here in the clinic concerning an issue that has been going on with wounds on the right lateral ankle region, left lower leg, and left gluteal location. Unfortunately the patient does have significant anemia and had a blood transfusion in September. He has been having erythropoietin shots and lives at [facility] in [place]. Most of these wounds are actually stated to be trauma/laceration in nature. The patient does have diabetes mellitus type 2, chronic kidney disease stage IV, and iron deficiency anemia. 05/08/2021 upon evaluation today patient's left leg actually appears to be doing pretty well with just a couple dry areas at this point there is no real need for any specific dressings at this point in my opinion. I think the biggest thing is just a compression sock and keeping this area as dry as possible and from swelling. In regard to the right leg there does appear to be some evidence of cellulitis which has been a little bit more concerned here. Is a little warm to touch not sure if this is just inflammation due to venous stasis or if there is actual infection here again there is really not a good area to culture although the good news is the patient does seem to be  doing quite well with regard to the healing in this ankle region. 05/22/2021 upon evaluation today patient appears to be doing somewhat poorly in regard to his wounds. He has been tolerating the dressing changes in general and I think the ankle is doing okay though a little bit hypergranular. In regard to the remaining wounds most of these were all new as result of skin tears from falls that he has been having frequently. He is currently at [facility] in [place]. This is an assisted living. With that being said I am beginning to wonder whether assisted living is really the proper placement for him to be honest. 06/05/2021 upon evaluation today patient unfortunately is having some issues here with his right wrist he has a skin tear. He also has some issues when it comes down to his right knee and his right ankle both of which appear to be doing much better however compared to last visit. Right now I think the biggest issue is the new skin tear on the wrist which occurred a week ago and really has not been taking care of properly. Patient's wound was not reapproximated as far as the skin that was pulled back and this may or may not hinder his healing prospects. I Georgina Peer try to pull it back down and Steri-Stripped it down if at all possible. 06/20/2021 upon evaluation today patient appears to be doing pretty well in regard to most of his wounds. In fact everything appears to be healed except for the right lateral ankle. Even this area appears that it may have been healed but his leg is so swollen because apparently has been refusing his Lasix that he has some weeping areas that broke loose around the wound region. Again a lot of this is scattered and difficult to even measure and quantified to be perfectly honest. Objective Constitutional Well-nourished and well-hydrated in no acute distress. Vitals Time Taken: 9:59 AM, Height: 69 in, Weight: 198 lbs, BMI: 29.2, Temperature: 97.6  F, Pulse: 84 bpm,  Respiratory Rate: 18 breaths/min, Blood Pressure: 148/60 mmHg. Respiratory labored, rapid respiration. Psychiatric this patient is able to make decisions and demonstrates good insight into disease process. Alert and Oriented x 3. pleasant and cooperative. General Notes: Patient's wounds in general showed signs of complete epithelization. Even the area on the right lateral ankle I think was healed before his swelling got out of control due to refusal of taking the Lasix. His breathing also sounds pretty terrible today as well to be honest. Overall I think that he needs to get back on his Lasix and may even need to be that some of his other medications need to be addressed and I recommended that at the facility where he is at for assisted living that he be seen by the provider there. Clayton [name], Clayton [name] ([id number]) Integumentary (Hair, Skin) Wound #1 status is Open. Original cause of wound was Gradually Appeared. The date acquired was: 12/26/2020. The wound has been in treatment 8 weeks. The wound is located on the Right,Lateral Ankle. The wound measures 1.5cm length x 0.8cm width x 0.3cm depth; 0.942cm^2 area and 0.283cm^3 volume. There is Fat Layer (Subcutaneous Tissue) exposed. There is no tunneling or undermining noted. There is a medium amount of serosanguineous drainage noted. The wound margin is well defined and not attached to the wound base. There is medium (34-66%) red, hyper - granulation within the wound bed. There is a medium (34-66%) amount of necrotic tissue within the wound bed including Adherent Slough. Wound #11 status is Healed - Epithelialized. Original cause of wound was Skin Tear/Laceration. The date acquired was: 05/29/2021. The wound has been in treatment 2 weeks. The wound is located on the Right,Distal Forearm. The wound measures 0cm length x 0cm width x 0cm depth; 0cm^2 area and 0cm^3 volume. There is no tunneling or undermining noted. There is a none present amount of  drainage noted. There is no granulation within the wound bed. There is no necrotic tissue within the wound bed. Wound #9 status is Healed - Epithelialized. Original cause of wound was Trauma. The date acquired was: 05/15/2021. The wound has been in treatment 4 weeks. The wound is located on the Right Lower Leg. The wound measures 0cm length x 0cm width x 0cm depth; 0cm^2 area and 0cm^3 volume. There is no tunneling or undermining noted. There is a none present amount of drainage noted. There is no granulation within the wound bed. There is no necrotic tissue within the wound bed. Assessment Active Problems ICD-10 Type 2 diabetes mellitus with other skin ulcer Chronic kidney disease, stage 4 (severe) Non-pressure chronic ulcer of right ankle with fat layer exposed Other iron deficiency anemias Abrasion, right knee, initial encounter Laceration without foreign body of right wrist, initial encounter Plan Follow-up Appointments: Return Appointment in 2 weeks. Home Health: [place], [place], [place], [place] for wound care. May utilize formulary equivalent dressing for wound treatment orders unless otherwise specified. Home Health Nurse may visit PRN to address patient s wound care needs. - Three times weekly Scheduled days for dressing changes to be completed; exception, patient has scheduled wound care visit that day. **Please direct any NON-WOUND related issues/requests for orders to patient's Primary Care Physician. **If current dressing causes regression in wound condition, may D/C ordered dressing product/s and apply Normal Saline Moist Dressing daily until next [place] or Other MD appointment. **Notify Wound Healing Center of regression in wound condition  at [phone]. Bathing/ Shower/ Hygiene: May shower; gently cleanse wound with antibacterial soap, rinse and pat dry prior to dressing wounds -  KEEP DRESSING DRY OR CHANGE AFTER SHOWER No tub bath. Edema Control - Lymphedema / Segmental Compressive Device / Other: Patient to wear own compression stockings. Remove compression stockings every night before going to bed and put on every morning when getting up. Elevate, Exercise Daily and Avoid Standing for Long Periods of Time. Elevate legs to the level of the heart and pump ankles as often as possible Elevate leg(s) parallel to the floor when sitting. DO YOUR BEST to sleep in the bed at night. DO NOT sleep in your recliner. Long hours of sitting in a recliner leads to swelling of the legs and/or potential wounds on your backside. Additional Orders / Instructions: Follow Nutritious Diet and Increase Protein Intake Other: - Due to multiple falls, patient need assistance when moving about. WOUND #1: - Ankle Wound Laterality: Right, Lateral Cleanser: Soap and Water (Home Health) 3 x Per Week/30 Days Discharge Instructions: Gently cleanse wound with antibacterial soap, rinse and pat dry prior to dressing wounds Cleanser: Wound Cleanser ([place]) 3 x Per Week/30 Days Discharge Instructions: Wash your hands with soap and water. Remove old dressing, discard into plastic bag and place into trash. Cleanse the wound with Wound Cleanser prior to applying a clean dressing using gauze sponges, not tissues or cotton balls. Do not scrub or use excessive force. Pat dry using gauze sponges, not tissue or cotton balls. Primary Dressing: Xeroform-HBD 2x2 (in/in) 3 x Per Week/30 Days Discharge Instructions: Apply Xeroform-HBD 2x2 (in/in) as directed Secondary Dressing: Zetuvit Plus Silicone Border Dressing 4x4 (in/in) ([place]) (Generic) 3 x Per Week/30 Days Clayton [name], Clayton [name] ([id number]) 1. I did recommend today that the patient should be seen by the provider at the assisted living facility. I think this is going to be important due to the fact that to be honest that his breathing needs to be  evaluated in order to see what needs to be done and to be honest if he is taking his fluid pills hopefully that should also seal up the ankle wound which is the last wound still open at this point. 2. I am also can recommend at this time that the patient continue with Xeroform to the right lateral ankle. 3. We will just use a dry gauze and a extraction at over the right forearm which again is healed but this will just help with protection. We will see patient back for reevaluation in 1 week here in the clinic. If anything worsens or changes patient will contact our office for additional recommendations. At the end of the visit his daughter did mention that he is having issues with areas on his gluteal area again as well. Next visit I will actually schedule him for wound 5 so that we can have a look at this at that time. I did have a brief look today and it does appear that he has some deep tissue injury I explained that he has been sitting much too long and needs to make sure to get up frequently during the day otherwise this is just continuing to get worse and that skin to be what happens ongoing. Electronic Signature(s) Signed: 06/20/2021 11:13:21 AM By: Worthy Keeler PA-C Entered By: Worthy Keeler on 06/20/2021 11:13:21 Clayton [name] ([id number]) -------------------------------------------------------------------------------- SuperBill Details Patient Name: Clayton [name] Date of Service: 06/20/2021 Medical Record Number: [id number] Patient Account Number: [id number]  Date of Birth/Sex: 11/24/1927 (88 y.o. M) Treating RN: Levora Dredge Primary Care Provider: Dorthy Cooler, Dibas Other Clinician: Referring Provider: Dorthy Cooler, Dibas Treating Provider/Extender: Skipper Cliche in Treatment: 8 Diagnosis Coding ICD-10 Codes Code Description E11.622 Type 2 diabetes mellitus with other skin ulcer N18.4 Chronic kidney disease, stage 4 (severe) L97.312 Non-pressure chronic ulcer of right  ankle with fat layer exposed 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 S80.211A Abrasion, right knee, initial encounter S61.511A Laceration without foreign body of right wrist, initial encounter Facility Procedures CPT4 Code: [id number] Description: [phone] - WOUND CARE VISIT-LEV 2 EST PT Modifier: Quantity: 1 Physician Procedures CPT4 Code: [id number] Description: 93810 - WC PHYS LEVEL 4 - EST PT Modifier: Quantity: 1 CPT4 Code: Description: ICD-10 Diagnosis Description E11.622 Type 2 diabetes mellitus with other skin ulcer N18.4 Chronic kidney disease, stage 4 (severe) L97.312 Non-pressure chronic ulcer of right ankle with fat layer exposed 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 Modifier: Quantity: Electronic Signature(s) Signed: 06/20/2021 11:15:21 AM By: Worthy Keeler PA-C Entered By: Worthy Keeler on 06/20/2021 11:15:21

## 2021-06-21 ENCOUNTER — Encounter: Payer: Self-pay | Admitting: Hematology and Oncology

## 2021-06-21 ENCOUNTER — Inpatient Hospital Stay: Payer: HMO

## 2021-06-21 ENCOUNTER — Inpatient Hospital Stay: Payer: HMO | Admitting: Hematology and Oncology

## 2021-06-21 ENCOUNTER — Inpatient Hospital Stay: Payer: HMO | Attending: Hematology and Oncology

## 2021-06-21 VITALS — BP 130/32 | HR 73 | Temp 97.9°F | Resp 16 | Ht 70.0 in | Wt 213.5 lb

## 2021-06-21 DIAGNOSIS — D631 Anemia in chronic kidney disease: Secondary | ICD-10-CM | POA: Diagnosis not present

## 2021-06-21 DIAGNOSIS — D649 Anemia, unspecified: Secondary | ICD-10-CM

## 2021-06-21 DIAGNOSIS — N184 Chronic kidney disease, stage 4 (severe): Secondary | ICD-10-CM | POA: Diagnosis not present

## 2021-06-21 DIAGNOSIS — E611 Iron deficiency: Secondary | ICD-10-CM

## 2021-06-21 DIAGNOSIS — D460 Refractory anemia without ring sideroblasts, so stated: Secondary | ICD-10-CM | POA: Insufficient documentation

## 2021-06-21 DIAGNOSIS — Z79899 Other long term (current) drug therapy: Secondary | ICD-10-CM | POA: Diagnosis not present

## 2021-06-21 LAB — COMPREHENSIVE METABOLIC PANEL
ALT: 8 U/L (ref 0–44)
AST: 12 U/L — ABNORMAL LOW (ref 15–41)
Albumin: 3 g/dL — ABNORMAL LOW (ref 3.5–5.0)
Alkaline Phosphatase: 92 U/L (ref 38–126)
Anion gap: 9 (ref 5–15)
BUN: 74 mg/dL — ABNORMAL HIGH (ref 8–23)
CO2: 22 mmol/L (ref 22–32)
Calcium: 8.3 mg/dL — ABNORMAL LOW (ref 8.9–10.3)
Chloride: 110 mmol/L (ref 98–111)
Creatinine, Ser: 3.04 mg/dL — ABNORMAL HIGH (ref 0.61–1.24)
GFR, Estimated: 19 mL/min — ABNORMAL LOW (ref >60)
Glucose, Bld: 171 mg/dL — ABNORMAL HIGH (ref 70–99)
Potassium: 4.2 mmol/L (ref 3.5–5.1)
Sodium: 141 mmol/L (ref 135–145)
Total Bilirubin: 0.3 mg/dL (ref 0.3–1.2)
Total Protein: 6.3 g/dL — ABNORMAL LOW (ref 6.5–8.1)

## 2021-06-21 LAB — CBC WITH DIFFERENTIAL (CANCER CENTER ONLY)
Abs Immature Granulocytes: 0.06 10*3/uL (ref 0.00–0.07)
Basophils Absolute: 0.1 10*3/uL (ref 0.0–0.1)
Basophils Relative: 1 %
Eosinophils Absolute: 0.7 10*3/uL — ABNORMAL HIGH (ref 0.0–0.5)
Eosinophils Relative: 7 %
HCT: 24 % — ABNORMAL LOW (ref 39.0–52.0)
Hemoglobin: 7.6 g/dL — ABNORMAL LOW (ref 13.0–17.0)
Immature Granulocytes: 1 %
Lymphocytes Relative: 11 %
Lymphs Abs: 1.2 10*3/uL (ref 0.7–4.0)
MCH: 27 pg (ref 26.0–34.0)
MCHC: 31.7 g/dL (ref 30.0–36.0)
MCV: 85.1 fL (ref 80.0–100.0)
Monocytes Absolute: 1.5 10*3/uL — ABNORMAL HIGH (ref 0.1–1.0)
Monocytes Relative: 14 %
Neutro Abs: 7.3 10*3/uL (ref 1.7–7.7)
Neutrophils Relative %: 66 %
Platelet Count: 368 10*3/uL (ref 150–400)
RBC: 2.82 MIL/uL — ABNORMAL LOW (ref 4.22–5.81)
RDW: 19.2 % — ABNORMAL HIGH (ref 11.5–15.5)
WBC Count: 10.8 10*3/uL — ABNORMAL HIGH (ref 4.0–10.5)
nRBC: 0 % (ref 0.0–0.2)

## 2021-06-21 LAB — SAMPLE TO BLOOD BANK

## 2021-06-21 MED ORDER — DARBEPOETIN ALFA 40 MCG/0.4ML IJ SOSY: 40.0000 ug | PREFILLED_SYRINGE | Freq: Once | INTRAMUSCULAR | Status: DC

## 2021-06-21 MED ORDER — DARBEPOETIN ALFA 100 MCG/0.5ML IJ SOSY
100.0000 ug | PREFILLED_SYRINGE | Freq: Once | INTRAMUSCULAR | Status: AC
  Administered 2021-06-21: 100 ug via SUBCUTANEOUS
  Filled 2021-06-21: qty 0.5

## 2021-06-21 NOTE — Progress Notes (Signed)
[name] CONSULT NOTE  Patient Care Team: Lujean Amel, MD as PCP - General (Family Medicine) Leonie Man, MD as PCP - Cardiology (Cardiology) Elayne Snare, MD as Attending Physician (Endocrinology)  CHIEF COMPLAINTS/PURPOSE OF CONSULTATION:  Anemia, follow up  ASSESSMENT & PLAN:   This is a very pleasant 88 year old male patient who is here with his daughter for severe anemia likely secondary to CKD vs Mild MDS changes. He was initially receiving packed red blood cells every 2 to 3 weeks for his severe anemia.  We started him on darbepoetin 40 mcg subcu every 28-day cycle.  He has been tolerating this well.  He has not required any blood transfusion in the past couple months although his hemoglobin continues to trend in the lower 7 to 8 g/dL.  We have discussed about increasing the dose today to 100 mcg once a month. He will continue follow-up with Dr. Joelyn Oms for his chronic kidney disease and related issues. He was instructed to return to clinic in about 2 months, labs monthly and darbepoetin monthly.  HISTORY OF PRESENTING ILLNESS:   ESAIAS [name] 88 y.o. male is here because of ongoing anemia.  Mr Clayton [name] is a 88 yr old male patient with PMH significant for BPH, CKD, COPD, DM, HTN referred to hematology for evaluation of anemia.    Interval History  Patient is here for follow-up with his daughter.   Since last visit patient was admitted with strokelike symptoms and currently lives in assisted living.  He has been doing well at the current assisted living, gaining weight, gained almost 15 to 20 pounds since his last visit here.  He also has been dealing with some fluid retention in his lower extremities, currently getting Lasix as prescribed. He denies any complaints for me today. He denies any change in his bowel habits, hematochezia or melena. He has to wear some pads to help with urinary incontinence. Rest of the pertinent 10 point ROS reviewed and  negative.  MEDICAL HISTORY:  Past Medical History:  Diagnosis Date   Anemia    BPH (benign prostatic hyperplasia)    CAD (coronary artery disease)    hx of stemi  04-2018    Chronic renal failure    Chronic renal insufficiency    COPD (chronic obstructive pulmonary disease) (HCC)    DDD (degenerative disc disease), lumbar    Diabetes mellitus    Hyperlipidemia    Hyperparathyroidism ([employer])    Hypertension    IDA (iron deficiency anemia)    Neuromuscular disorder (HCC)    OSA on CPAP    Osteomyelitis of forearm (HCC)    Osteoporosis    Wenckebach second degree AV block 11/2020   Event monitor showed Darden Amber block intermittently as well as first-degree block.  Rare occasional PACs and PVCs.  Minimum heart rate 32 bpm at early morning hours.  Maximal heart rate 112 bpm, average 76 bpm.    SURGICAL HISTORY: Past Surgical History:  Procedure Laterality Date   CORONARY STENT INTERVENTION N/A 04/20/2018   Procedure: CORONARY STENT INTERVENTION;  Surgeon: Leonie Man, MD;  Location: [facility] INVASIVE CV LAB;;;     CORONARY/GRAFT ACUTE MI REVASCULARIZATION N/A 04/20/2018   Procedure: Coronary/Graft Acute MI Revascularization;  Surgeon: Leonie Man, MD;  Location: [place] CV [facility];  Service: Cardiovascular;  Laterality: N/A; --> after initial PTCA restoring flow down the RCA, there was diffuse proximal to mid disease treated with a DES SYNERGY 3 X 38 ( 3.8mm)  EYE SURGERY     HOLTER MONITOR  05/2018   Sinus rhythm noted with sinus bradycardia.  Also sinus rhythm with 2-1 AV block noted.  Maximum heart rate was sinus tachycardia 123 bpm.  Rare PVCs accelerated idioventricular rhythm noted.  Intermittent Wenckebach block noted.   I & D EXTREMITY Right 08/13/2016   Procedure: IRRIGATION AND DEBRIDEMENT RIGHT ELBOW AND HAND;  Surgeon: Milly Jakob, MD;  Location: [facility];  Service: Orthopedics;  Laterality: Right;   LEFT HEART CATH AND CORONARY ANGIOGRAPHY N/A 04/20/2018   Procedure:  LEFT HEART CATH AND CORONARY ANGIOGRAPHY;  Surgeon: Leonie Man, MD;  Location: [place] [facility]; mRCA 100%&80% (after 55% tapering in pRCA), mLM-ostLAD 40% w/ 85% ostCx, ost-pCx 65% @ Om1 w/ ost 85%. pLAD 60% & 50% after D1 with distal 75%, ostD1 65% & 90% after small side branch.     TRANSTHORACIC ECHOCARDIOGRAM  04/21/2018   EF 50-55%. Posterior Wall HK. Severe RV HK, dilated IVC. Temp wire in place   VASECTOMY     VIDEO BRONCHOSCOPY Bilateral 08/25/2012   Procedure: VIDEO BRONCHOSCOPY WITHOUT FLUORO;  Surgeon: Brand Males, MD;  Location: [place];  Service: Cardiopulmonary;  Laterality: Bilateral;    SOCIAL HISTORY: Social History   Socioeconomic History   Marital status: Widowed    Spouse name: Not on file   Number of children: 4   Years of education: 16   Highest education level: Bachelor's degree (e.g., BA, AB, BS)  Occupational History   Occupation: retired  Tobacco Use   Smoking status: Former    Packs/day: 3.00    Years: 33.00    Pack years: 99.00    Types: Cigarettes    Quit date: 06/19/1973    Years since quitting: 48.0   Smokeless tobacco: Former    Quit date: 09/26/1975  Vaping Use   Vaping Use: Never used  Substance and Sexual Activity   Alcohol use: No   Drug use: No   Sexual activity: Not Currently  Other Topics Concern   Not on file  Social History Narrative   He is accompanied by his daughter who seems to be quite involved with his care.      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 He also went to the [facility] several times a week until he began to have symptoms.    Social Determinants of Health   Financial Resource Strain: Not on file  Food Insecurity: Not on file  Transportation Needs: Not on file  Physical Activity: Not on file  Stress: Not on file  Social Connections: Not on file  Intimate Partner Violence: Not on file    FAMILY HISTORY: Family History  Problem Relation Age of Onset    Diabetes Mellitus II Other    Hypertension Other    Other Father        complications from strep   CVA Father    Cancer Sister    Other Brother        hip replacement complications     ALLERGIES:  is allergic to doxycycline, levaquin [levofloxacin in d5w], aliskiren, lisinopril, losartan potassium, nitrous oxide, penicillins, tape, and diltiazem.  MEDICATIONS:  Current Outpatient Medications  Medication Sig Dispense Refill   ACCU-CHEK SOFTCLIX LANCETS lancets TEST BLOOD SUGAR FOUR TIMES DAILY 400 each 1   albuterol (PROVENTIL HFA;VENTOLIN HFA) 108 (90 Base) MCG/ACT inhaler Inhale 1-2 puffs into the lungs every 6 (six) hours as needed for wheezing or  shortness of breath.     Alcohol Swabs (B-D SINGLE USE SWABS REGULAR) PADS USE 7 SWABS DAILY AS DIRECTED 600 each 0   aspirin EC 81 MG tablet Take 1 tablet (81 mg total) by mouth daily. Swallow whole. 90 tablet 3   atorvastatin (LIPITOR) 40 MG tablet Take 1 tablet (40 mg total) by mouth daily. (Patient taking differently: Take 40 mg by mouth. Mon, Wed, Fri and Sat) 90 tablet 3   cholecalciferol (VITAMIN D) 1000 units tablet Take 2,000 Units by mouth daily.     Continuous Blood Gluc Receiver (FREESTYLE LIBRE 14 DAY READER) DEVI 1 each by Does not apply route every 14 (fourteen) days. Use reader to monitor blood sugar continuously with freestyle libre sensor. 1 each 0   Continuous Blood Gluc Sensor (FREESTYLE LIBRE 14 DAY SENSOR) MISC USE AS DIRECTED EVERY 14 DAYS TO MONITOR BLOOD SUGAR CONTINUOUSLY 6 each 3   finasteride (PROSCAR) 5 MG tablet TAKE 1 TABLET EVERY DAY 90 tablet 1   furosemide (LASIX) 20 MG tablet Take 1 tablet (20 mg total) by mouth 2 (two) times daily. Take seocnd dose about 12 noon each daily 180 tablet 3   glucose blood test strip Use as instructed to test blood sugars 4 times daily 150 each 12   insulin aspart (NOVOLOG FLEXPEN) 100 UNIT/ML FlexPen INJECT 7 UNITS SUBCUTANEOUS EVERY MORNING, 6 UNITS AT LUNCH, AND 9 UNITS AT  SUPPER 30 mL 3   insulin glargine (LANTUS SOLOSTAR) 100 UNIT/ML Solostar Pen Inject 0.16 mLs (16 Units total) into the skin daily. 15 mL 3   isosorbide mononitrate (IMDUR) 30 MG 24 hr tablet Take 1 tablet (30 mg total) by mouth daily. 30 tablet 11   Melatonin 5 MG TABS Take 2 tablets by mouth at bedtime.      Multiple Vitamin (MULTIVITAMIN WITH MINERALS) TABS tablet Take 1 tablet by mouth daily.     nitroGLYCERIN (NITROSTAT) 0.4 MG SL tablet Place 1 tablet (0.4 mg total) under the tongue every 5 (five) minutes as needed for chest pain. 25 tablet 6   omeprazole (PRILOSEC) 20 MG capsule Take 1 capsule (20 mg total) by mouth daily. 30 capsule 3   oxybutynin (DITROPAN) 5 MG tablet Take 5 mg by mouth daily.     TRUEPLUS INSULIN SYRINGE 31G X 5/16" 0.3 ML MISC USE THREE TIMES DAILY AS NEEDED 300 each 2   No current facility-administered medications for this visit.     PHYSICAL EXAMINATION:  ECOG PERFORMANCE STATUS: 0 - Asymptomatic  Vitals:   06/21/21 1109  BP: (!) 130/32  Pulse: 73  Resp: 16  Temp: 97.9 F (36.6 C)  SpO2: 95%    Filed Weights   06/21/21 1109  Weight: 213 lb 8 oz (96.8 kg)    Physical Exam Constitutional:      Appearance: Normal appearance.     Comments: Pale appearance.  He has gained some weight since his last visit here, about 15 pounds to 20 pounds  HENT:     Head: Normocephalic and atraumatic.  Cardiovascular:     Rate and Rhythm: Normal rate and regular rhythm.     Pulses: Normal pulses.     Heart sounds: Normal heart sounds.  Pulmonary:     Effort: Pulmonary effort is normal.     Breath sounds: Normal breath sounds. No rales.     Comments: Poor inspiratory effort, cough on deep inspiration could not appreciate any adventitious sounds. Musculoskeletal:     Right lower leg: Edema (  Bilateral lower extremity pitting pedal edema) present.     Left lower leg: Edema (Pitting pedal edema bilateral lower extremity) present.  Skin:    Coloration: Skin is  pale.  Neurological:     General: No focal deficit present.     Mental Status: He is alert.  Psychiatric:        Mood and Affect: Mood normal.   LABORATORY DATA:  I have reviewed the data as listed  Lab Results  Component Value Date   WBC 10.8 (H) 06/21/2021   HGB 7.6 (L) 06/21/2021   HCT 24.0 (L) 06/21/2021   MCV 85.1 06/21/2021   PLT 368 06/21/2021     Chemistry      Component Value Date/Time   NA 135 05/23/2021 0953   K 5.0 05/23/2021 0953   CL 104 05/23/2021 0953   CO2 18 (L) 05/23/2021 0953   BUN 78 (H) 05/23/2021 0953   CREATININE 4.44 (HH) 05/23/2021 0953   GLU 146 03/09/2020 0000      Component Value Date/Time   CALCIUM 8.4 (L) 05/23/2021 0953   ALKPHOS 127 (H) 05/23/2021 0953   AST 27 05/23/2021 0953   ALT 21 05/23/2021 0953   BILITOT 0.2 (L) 05/23/2021 0953     CBC from today showed a hemoglobin of 7.6, white blood cell count of 10,800, platelet count of 368,000  RADIOGRAPHIC STUDIES:  I have personally reviewed the radiological images as listed and agreed with the findings in the report.  No results found.   All questions were answered.  I spent 20 minutes in the care of this patient including history and physical, review of records, counseling and coordination of care.   Benay Pike, MD 06/21/2021 11:27 AM

## 2021-06-21 NOTE — Progress Notes (Signed)
Increase Aranesp dose to 142mcg Q 4 weeks per MD. Orders changed as requested.  Raul Del Lynch, [place], BCPS, BCOP 06/21/2021 12:41 PM

## 2021-06-27 ENCOUNTER — Other Ambulatory Visit: Payer: Self-pay

## 2021-06-27 ENCOUNTER — Ambulatory Visit (INDEPENDENT_AMBULATORY_CARE_PROVIDER_SITE_OTHER): Payer: HMO | Admitting: Endocrinology

## 2021-06-27 ENCOUNTER — Encounter: Payer: Self-pay | Admitting: Endocrinology

## 2021-06-27 ENCOUNTER — Encounter: Payer: Self-pay | Admitting: Hematology and Oncology

## 2021-06-27 VITALS — BP 142/60 | HR 71

## 2021-06-27 DIAGNOSIS — E1165 Type 2 diabetes mellitus with hyperglycemia: Secondary | ICD-10-CM | POA: Diagnosis not present

## 2021-06-27 DIAGNOSIS — N184 Chronic kidney disease, stage 4 (severe): Secondary | ICD-10-CM

## 2021-06-27 DIAGNOSIS — Z794 Long term (current) use of insulin: Secondary | ICD-10-CM | POA: Diagnosis not present

## 2021-06-27 LAB — POCT GLYCOSYLATED HEMOGLOBIN (HGB A1C): Hemoglobin A1C: 7.9 % — AB (ref 4.0–5.6)

## 2021-06-27 NOTE — Progress Notes (Signed)
Patient ID: Clayton [name], male   DOB: September 05, 1927, 88 y.o.   MRN: [id number]     Reason for Appointment: Followup of diabetes and various issues  History of Present Illness   Type 2 DIABETES MELITUS diagnosed 1989  He has had long-standing diabetes and has been on basal bolus insulin for the last few years Since 2014 he has required larger doses of insulin especially with having to take periodic courses of steroids for his pulmonary problems Also his Actos was stopped because of tendency to edema and metformin stopped because of renal dysfunction  Insulin regimen: Novolog 6-6-8 units before meals, Lantus 20 units in a.m., using syringes  A1c is significantly high A1c of 7.9 compared to 6.4   Current blood sugar patterns and problems identified: He has been in the nursing home for the last month or so 2 months or so With this his blood sugars are worse This is partly related to poor diet with more carbohydrate and sweets like ice cream regularly Also appears to have gained weight  Previously with 82% of his readings on the sensor within target his control was excellent but now this is only 44% within target His daughter said that nursing home prefers the blood sugars to be around 200 However his blood sugars are fairly consistently high after breakfast and not improving until late evening Also fasting readings are mostly high with overnight blood sugars at least 160 average Also not clear how accurate his freestyle Elenor Legato is NOVOLOG apparently is being given after he comes back to his room from the dining room which may be at least 10 to 15 minutes after his meal Previously his freestyle libre was about 10-15 mg lower than the actual fingerstick reading No hypoglycemia   Blood sugar patterns from the CGM download are interpreted as follows  Blood sugars have not been compared to the fingersticks at home for accuracy   Summary of patterns:  Blood sugars are generally  averaging in the 160s overnight and rising modestly after breakfast but continuing to rise until dinnertime, after dinner blood sugars gradually come down But with variable extent POSTPRANDIAL readings are modestly higher after breakfast but continue to rise further after The blood sugars after dinner are highly variable with some significantly high readings and some readings that are declining controlled overnight with some variability, postprandial readings are fluctuating between an average of 150-160 with some variability at all times but no general significant high readings; hypoglycemia has been only occasional before lunch or bedtime OVERNIGHT readings are fairly steady with some variability but no hypoglycemia No hypoglycemia  CGM use % of time 80  2-week average/GV 188  Time in range 44%  % Time Above 180 6  % Time above 250 50+6  % Time Below 70 0     PRE-MEAL Fasting Lunch Dinner Bedtime Overall  Glucose range:       Averages: 167 177 215 178 188   POST-MEAL PC Breakfast PC Lunch PC Dinner  Glucose range:     Averages: 195 205 225   Previously:  CGM use % of time 90  2-week average/GV 143/26  Time in range     82   %  % Time Above 180 15  % Time above 250   % Time Below 70 2+1     PRE-MEAL Fasting Lunch Dinner Bedtime Overall  Glucose range:       Averages: 138 127 143  134    POST-MEAL PC Breakfast PC Lunch PC Dinner  Glucose range:     Averages: 156 162  161     Meals: 3 meals per day.  breakfast usually about 5 AM: oatmeal / grits, with peanut butter, for lunch he will have a sandwich with apple around 11 AM  Dinner usually at 5 PM-6 PM       Weight history:  Wt Readings from Last 3 Encounters:  06/21/21 213 lb 8 oz (96.8 kg)  05/15/21 198 lb (89.8 kg)  04/21/21 198 lb (89.8 kg)   LABS:  Lab Results  Component Value Date   HGBA1C 7.9 (A) 06/27/2021   HGBA1C 6.4 (A) 02/21/2021   HGBA1C 6.0 (A) 10/18/2020   Lab Results  Component Value Date    MICROALBUR 5.4 (H) 08/07/2018   LDLCALC 48 07/19/2020   CREATININE 3.04 (H) 06/21/2021   Problem 2:  Chronic kidney disease:  He has had chronic renal insufficiency, creatinine was 1.7 in 2014 with slow progression subsequently.  This has been related to nephropathy and glomerulosclerosis Has been evaluated and followed by nephrologist   Lab Results  Component Value Date   CREATININE 3.04 (H) 06/21/2021   CREATININE 4.44 (HH) 05/23/2021   CREATININE 3.29 (H) 05/15/2021   CREATININE 2.58 (H) 04/18/2021   He has been prescribed Lasix 20 mg daily, last prescription done by his cardiologist    Allergies as of 06/27/2021       Reactions   Doxycycline Rash   Levaquin [levofloxacin In D5w]    Caused chest pain and heartburn   Aliskiren Other (See Comments)   Unknown per Daughter    Lisinopril Other (See Comments)   Unknown per Daughter    Losartan Potassium Other (See Comments)   Nitrous Oxide Other (See Comments)   Reaction:  Unknown    Penicillins Other (See Comments)   Reaction:  Unknown Has patient had a PCN reaction causing immediate rash, facial/tongue/throat swelling, SOB or lightheadedness with hypotension: Unsure Has patient had a PCN reaction causing severe rash involving mucus membranes or skin necrosis: Unsure Has patient had a PCN reaction that required hospitalization Unsure  Has patient had a PCN reaction occurring within the last 10 years: No If all of the above answers are "NO", then may proceed with Cephalosporin use.   Tape Other (See Comments)   Reaction:  Tears pts skin    Diltiazem Rash        Medication List        Accurate as of June 27, 2021  3:26 PM. If you have any questions, ask your nurse or doctor.          Accu-Chek Softclix Lancets lancets TEST BLOOD SUGAR FOUR TIMES DAILY   albuterol 108 (90 Base) MCG/ACT inhaler Commonly known as: VENTOLIN HFA Inhale 1-2 puffs into the lungs every 6 (six) hours as needed for wheezing or  shortness of breath.   aspirin EC 81 MG tablet Take 1 tablet (81 mg total) by mouth daily. Swallow whole.   atorvastatin 40 MG tablet Commonly known as: LIPITOR Take 1 tablet (40 mg total) by mouth daily. What changed:  when to take this additional instructions   B-D SINGLE USE SWABS REGULAR Pads USE 7 SWABS DAILY AS DIRECTED   cholecalciferol 1000 units tablet Commonly known as: VITAMIN D Take 2,000 Units by mouth daily.   finasteride 5 MG tablet Commonly known as: PROSCAR TAKE 1 TABLET EVERY DAY   FreeStyle Libre 14 Day  Reader Kerrin Mo 1 each by Does not apply route every 14 (fourteen) days. Use reader to monitor blood sugar continuously with freestyle libre sensor.   FreeStyle Libre 14 Day Sensor Misc USE AS DIRECTED EVERY 14 DAYS TO MONITOR BLOOD SUGAR CONTINUOUSLY   furosemide 20 MG tablet Commonly known as: LASIX Take 1 tablet (20 mg total) by mouth 2 (two) times daily. Take seocnd dose about 12 noon each daily   glucose blood test strip Use as instructed to test blood sugars 4 times daily   isosorbide mononitrate 30 MG 24 hr tablet Commonly known as: IMDUR Take 1 tablet (30 mg total) by mouth daily.   Lantus SoloStar 100 UNIT/ML Solostar Pen Generic drug: insulin glargine Inject 0.16 mLs (16 Units total) into the skin daily. What changed: additional instructions   melatonin 5 MG Tabs Take 2 tablets by mouth at bedtime.   multivitamin with minerals Tabs tablet Take 1 tablet by mouth daily.   nitroGLYCERIN 0.4 MG SL tablet Commonly known as: NITROSTAT Place 1 tablet (0.4 mg total) under the tongue every 5 (five) minutes as needed for chest pain.   NovoLOG FlexPen 100 UNIT/ML FlexPen Generic drug: insulin aspart INJECT 7 UNITS SUBCUTANEOUS EVERY MORNING, 6 UNITS AT LUNCH, AND 9 UNITS AT SUPPER What changed: additional instructions   omeprazole 20 MG capsule Commonly known as: PRILOSEC Take 1 capsule (20 mg total) by mouth daily.   oxybutynin 5 MG  tablet Commonly known as: DITROPAN Take 5 mg by mouth daily.   TRUEplus Insulin Syringe 31G X 5/16" 0.3 ML Misc Generic drug: Insulin Syringe-Needle U-100 USE THREE TIMES DAILY AS NEEDED        Allergies:  Allergies  Allergen Reactions   Doxycycline Rash   Levaquin [Levofloxacin In D5w]     Caused chest pain and heartburn   Aliskiren Other (See Comments)    Unknown per Daughter    Lisinopril Other (See Comments)    Unknown per Daughter    Losartan Potassium Other (See Comments)   Nitrous Oxide Other (See Comments)    Reaction:  Unknown    Penicillins Other (See Comments)    Reaction:  Unknown Has patient had a PCN reaction causing immediate rash, facial/tongue/throat swelling, SOB or lightheadedness with hypotension: Unsure Has patient had a PCN reaction causing severe rash involving mucus membranes or skin necrosis: Unsure Has patient had a PCN reaction that required hospitalization Unsure  Has patient had a PCN reaction occurring within the last 10 years: No If all of the above answers are "NO", then may proceed with Cephalosporin use.   Tape Other (See Comments)    Reaction:  Tears pts skin    Diltiazem Rash    Past Medical History:  Diagnosis Date   Anemia    BPH (benign prostatic hyperplasia)    CAD (coronary artery disease)    hx of stemi  04-2018    Chronic renal failure    Chronic renal insufficiency    COPD (chronic obstructive pulmonary disease) (HCC)    DDD (degenerative disc disease), lumbar    Diabetes mellitus    Hyperlipidemia    Hyperparathyroidism ([place])    Hypertension    IDA (iron deficiency anemia)    Neuromuscular disorder (HCC)    OSA on CPAP    Osteomyelitis of forearm (HCC)    Osteoporosis    Wenckebach second degree AV block 11/2020   Event monitor showed Darden Amber block intermittently as well as first-degree block.  Rare occasional PACs and  PVCs.  Minimum heart rate 32 bpm at early morning hours.  Maximal heart rate 112 bpm, average  76 bpm.    Past Surgical History:  Procedure Laterality Date   CORONARY STENT INTERVENTION N/A 04/20/2018   Procedure: CORONARY STENT INTERVENTION;  Surgeon: Leonie Man, MD;  Location: [facility];;;     CORONARY/GRAFT ACUTE MI REVASCULARIZATION N/A 04/20/2018   Procedure: Coronary/Graft Acute MI Revascularization;  Surgeon: Leonie Man, MD;  Location: [facility];  Service: Cardiovascular;  Laterality: N/A; --> after initial PTCA restoring flow down the RCA, there was diffuse proximal to mid disease treated with a DES SYNERGY 3 X 38 ( 3.46mm)   EYE SURGERY     HOLTER MONITOR  05/2018   Sinus rhythm noted with sinus bradycardia.  Also sinus rhythm with 2-1 AV block noted.  Maximum heart rate was sinus tachycardia 123 bpm.  Rare PVCs accelerated idioventricular rhythm noted.  Intermittent Wenckebach block noted.   I & D EXTREMITY Right 08/13/2016   Procedure: IRRIGATION AND DEBRIDEMENT RIGHT ELBOW AND HAND;  Surgeon: Milly Jakob, MD;  Location: [facility];  Service: Orthopedics;  Laterality: Right;   LEFT HEART CATH AND CORONARY ANGIOGRAPHY N/A 04/20/2018   Procedure: LEFT HEART CATH AND CORONARY ANGIOGRAPHY;  Surgeon: Leonie Man, MD;  Location: [facility]; mRCA 100%&80% (after 55% tapering in pRCA), mLM-ostLAD 40% w/ 85% ostCx, ost-pCx 65% @ Om1 w/ ost 85%. pLAD 60% & 50% after D1 with distal 75%, ostD1 65% & 90% after small side branch.     TRANSTHORACIC ECHOCARDIOGRAM  04/21/2018   EF 50-55%. Posterior Wall HK. Severe RV HK, dilated IVC. Temp wire in place   VASECTOMY     VIDEO BRONCHOSCOPY Bilateral 08/25/2012   Procedure: VIDEO BRONCHOSCOPY WITHOUT FLUORO;  Surgeon: Brand Males, MD;  Location: [facility];  Service: Cardiopulmonary;  Laterality: Bilateral;    Family History  Problem Relation Age of Onset   Diabetes Mellitus II Other    Hypertension Other    Other Father        complications from strep   CVA Father    Cancer Sister    Other  Brother        hip replacement complications     Social History:  reports that he quit smoking about 48 years ago. His smoking use included cigarettes. He has a 99.00 pack-year smoking history. He quit smokeless tobacco use about 45 years ago. He reports that he does not drink alcohol and does not use drugs.  Review of Systems  BLOOD PRESSURE:  Not on any antihypertensive medications  BP Readings from Last 3 Encounters:  06/27/21 (!) 142/60  06/21/21 (!) 130/32  05/23/21 (!) 129/58   Taking Lasix for edema, taking 20 mg twice daily, dose adjusted by nephrologist and cardiologist   ANEMIA:   He has a  history of anemia related to renal dysfunction and mild iron deficiency  Has been treated with iron infusions and EPO   Lab Results  Component Value Date   WBC 10.8 (H) 06/21/2021   HGB 7.6 (L) 06/21/2021   HCT 24.0 (L) 06/21/2021   MCV 85.1 06/21/2021   PLT 368 06/21/2021   Lab Results  Component Value Date   IRON 56 11/22/2020   TIBC 293 11/22/2020   FERRITIN 213 11/22/2020    He has had sleep apnea, using CPAP and this is being adjusted by pulmonologist.     HYPERLIPIDEMIA:     He  is on atorvastatin since his MI prescribed by cardiologist, last LDL:  Lab Results  Component Value Date   CHOL 103 07/19/2020   HDL 43.20 07/19/2020   LDLCALC 48 07/19/2020   TRIG 60.0 07/19/2020   CHOLHDL 2 07/19/2020   Lab Results  Component Value Date   ALT 8 06/21/2021   ALT 21 05/23/2021        Examination:   BP (!) 142/60    Pulse 71    SpO2 98%   There is no height or weight on file to calculate BMI.   No significant ankle edema present  Assesment/PLAN:   Diabetes type 2 with obesity treated with basal bolus insulin  See history of present illness for detailed discussion of current diabetes management, blood sugar patterns and problems identifie  A1c is higher than usual at 7.9  Blood sugars are worse with his going to the nursing home  Recently his blood  sugars are averaging 188 Previously freestyle libre appears to be reading about 10-15% lower than the actual fingersticks reading and actual blood sugars may be higher  Although his level of control may be adequate for his age he has minimal fluctuation in his blood sugars and he has consistently high readings at all times except sometimes late evening when they are slightly lower  For this reason he can increase his NovoLog at breakfast and lunch by 2 units and Lantus by 2 units safely Instructions given for nursing home to give the NovoLog before starting to eat or at least as soon as he finishes We will try to cut back on carbohydrates to prevent further weight gain  RENAL insufficiency: Baseline creatinine at 3, had been worse the previous 2 times  Total visit time including counseling = 30 minutes  Patient Instructions  A1c is 7.9 with last 2-week average of 188  NOVOLOG: Please administer as soon as possible after he finishes his meals  INCREASE NOVOLOG to 8 units with breakfast and lunch also  Increase LANTUS to 22 units instead of 20 units to be given at 6 AM  Follow-up in 4 months   Masaye Gatchalian  06/27/2021, 3:26 PM

## 2021-06-27 NOTE — Patient Instructions (Signed)
A1c is 7.9 with last 2-week average of 188  NOVOLOG: Please administer as soon as possible after he finishes his meals  INCREASE NOVOLOG to 8 units with breakfast and lunch also  Increase LANTUS to 22 units instead of 20 units to be given at 6 AM

## 2021-06-28 ENCOUNTER — Encounter: Payer: Self-pay | Admitting: Hematology and Oncology

## 2021-06-30 NOTE — Telephone Encounter (Signed)
Instructions have been faxed

## 2021-07-02 ENCOUNTER — Emergency Department (HOSPITAL_COMMUNITY): Payer: HMO

## 2021-07-02 ENCOUNTER — Inpatient Hospital Stay (HOSPITAL_COMMUNITY)
Admission: EM | Admit: 2021-07-02 | Discharge: 2021-07-09 | DRG: 177 | Disposition: A | Discharge status: Nursing Home (Includes ICF) | Payer: HMO | Source: Skilled Nursing Facility | Attending: Internal Medicine | Admitting: Internal Medicine

## 2021-07-02 ENCOUNTER — Encounter (HOSPITAL_COMMUNITY): Payer: Self-pay

## 2021-07-02 ENCOUNTER — Other Ambulatory Visit: Payer: Self-pay

## 2021-07-02 DIAGNOSIS — J690 Pneumonitis due to inhalation of food and vomit: Secondary | ICD-10-CM | POA: Diagnosis not present

## 2021-07-02 DIAGNOSIS — Z8249 Family history of ischemic heart disease and other diseases of the circulatory system: Secondary | ICD-10-CM

## 2021-07-02 DIAGNOSIS — I25119 Atherosclerotic heart disease of native coronary artery with unspecified angina pectoris: Secondary | ICD-10-CM | POA: Diagnosis present

## 2021-07-02 DIAGNOSIS — D509 Iron deficiency anemia, unspecified: Secondary | ICD-10-CM | POA: Diagnosis present

## 2021-07-02 DIAGNOSIS — K5900 Constipation, unspecified: Secondary | ICD-10-CM | POA: Diagnosis present

## 2021-07-02 DIAGNOSIS — E785 Hyperlipidemia, unspecified: Secondary | ICD-10-CM | POA: Diagnosis present

## 2021-07-02 DIAGNOSIS — J449 Chronic obstructive pulmonary disease, unspecified: Secondary | ICD-10-CM | POA: Diagnosis present

## 2021-07-02 DIAGNOSIS — R197 Diarrhea, unspecified: Secondary | ICD-10-CM | POA: Diagnosis not present

## 2021-07-02 DIAGNOSIS — E876 Hypokalemia: Secondary | ICD-10-CM | POA: Diagnosis present

## 2021-07-02 DIAGNOSIS — N184 Chronic kidney disease, stage 4 (severe): Secondary | ICD-10-CM | POA: Diagnosis present

## 2021-07-02 DIAGNOSIS — M810 Age-related osteoporosis without current pathological fracture: Secondary | ICD-10-CM | POA: Diagnosis present

## 2021-07-02 DIAGNOSIS — J9601 Acute respiratory failure with hypoxia: Secondary | ICD-10-CM | POA: Diagnosis present

## 2021-07-02 DIAGNOSIS — Z833 Family history of diabetes mellitus: Secondary | ICD-10-CM

## 2021-07-02 DIAGNOSIS — E11649 Type 2 diabetes mellitus with hypoglycemia without coma: Secondary | ICD-10-CM | POA: Diagnosis present

## 2021-07-02 DIAGNOSIS — I130 Hypertensive heart and chronic kidney disease with heart failure and stage 1 through stage 4 chronic kidney disease, or unspecified chronic kidney disease: Secondary | ICD-10-CM | POA: Diagnosis present

## 2021-07-02 DIAGNOSIS — L89153 Pressure ulcer of sacral region, stage 3: Secondary | ICD-10-CM | POA: Diagnosis present

## 2021-07-02 DIAGNOSIS — E1129 Type 2 diabetes mellitus with other diabetic kidney complication: Secondary | ICD-10-CM | POA: Diagnosis present

## 2021-07-02 DIAGNOSIS — E441 Mild protein-calorie malnutrition: Secondary | ICD-10-CM | POA: Diagnosis present

## 2021-07-02 DIAGNOSIS — Z7951 Long term (current) use of inhaled steroids: Secondary | ICD-10-CM

## 2021-07-02 DIAGNOSIS — E1122 Type 2 diabetes mellitus with diabetic chronic kidney disease: Secondary | ICD-10-CM | POA: Diagnosis present

## 2021-07-02 DIAGNOSIS — Z20822 Contact with and (suspected) exposure to covid-19: Secondary | ICD-10-CM | POA: Diagnosis present

## 2021-07-02 DIAGNOSIS — B965 Pseudomonas (aeruginosa) (mallei) (pseudomallei) as the cause of diseases classified elsewhere: Secondary | ICD-10-CM | POA: Diagnosis present

## 2021-07-02 DIAGNOSIS — J9811 Atelectasis: Secondary | ICD-10-CM | POA: Diagnosis present

## 2021-07-02 DIAGNOSIS — J189 Pneumonia, unspecified organism: Secondary | ICD-10-CM | POA: Diagnosis present

## 2021-07-02 DIAGNOSIS — I252 Old myocardial infarction: Secondary | ICD-10-CM

## 2021-07-02 DIAGNOSIS — I10 Essential (primary) hypertension: Secondary | ICD-10-CM | POA: Diagnosis present

## 2021-07-02 DIAGNOSIS — Z66 Do not resuscitate: Secondary | ICD-10-CM | POA: Diagnosis present

## 2021-07-02 DIAGNOSIS — E1165 Type 2 diabetes mellitus with hyperglycemia: Secondary | ICD-10-CM | POA: Diagnosis not present

## 2021-07-02 DIAGNOSIS — E872 Acidosis, unspecified: Secondary | ICD-10-CM | POA: Diagnosis present

## 2021-07-02 DIAGNOSIS — Z809 Family history of malignant neoplasm, unspecified: Secondary | ICD-10-CM

## 2021-07-02 DIAGNOSIS — Z6827 Body mass index (BMI) 27.0-27.9, adult: Secondary | ICD-10-CM

## 2021-07-02 DIAGNOSIS — Z955 Presence of coronary angioplasty implant and graft: Secondary | ICD-10-CM

## 2021-07-02 DIAGNOSIS — I2510 Atherosclerotic heart disease of native coronary artery without angina pectoris: Secondary | ICD-10-CM | POA: Diagnosis present

## 2021-07-02 DIAGNOSIS — E162 Hypoglycemia, unspecified: Secondary | ICD-10-CM | POA: Diagnosis present

## 2021-07-02 DIAGNOSIS — Z79899 Other long term (current) drug therapy: Secondary | ICD-10-CM

## 2021-07-02 DIAGNOSIS — Z823 Family history of stroke: Secondary | ICD-10-CM

## 2021-07-02 DIAGNOSIS — N179 Acute kidney failure, unspecified: Secondary | ICD-10-CM | POA: Diagnosis present

## 2021-07-02 DIAGNOSIS — I5032 Chronic diastolic (congestive) heart failure: Secondary | ICD-10-CM | POA: Diagnosis present

## 2021-07-02 DIAGNOSIS — I5042 Chronic combined systolic (congestive) and diastolic (congestive) heart failure: Secondary | ICD-10-CM | POA: Diagnosis present

## 2021-07-02 DIAGNOSIS — D631 Anemia in chronic kidney disease: Secondary | ICD-10-CM | POA: Diagnosis present

## 2021-07-02 DIAGNOSIS — R9431 Abnormal electrocardiogram [ECG] [EKG]: Secondary | ICD-10-CM | POA: Diagnosis present

## 2021-07-02 DIAGNOSIS — N400 Enlarged prostate without lower urinary tract symptoms: Secondary | ICD-10-CM | POA: Diagnosis present

## 2021-07-02 DIAGNOSIS — Z87891 Personal history of nicotine dependence: Secondary | ICD-10-CM

## 2021-07-02 DIAGNOSIS — E1151 Type 2 diabetes mellitus with diabetic peripheral angiopathy without gangrene: Secondary | ICD-10-CM | POA: Diagnosis present

## 2021-07-02 DIAGNOSIS — R1310 Dysphagia, unspecified: Secondary | ICD-10-CM | POA: Diagnosis present

## 2021-07-02 DIAGNOSIS — I4819 Other persistent atrial fibrillation: Secondary | ICD-10-CM | POA: Diagnosis present

## 2021-07-02 DIAGNOSIS — Z794 Long term (current) use of insulin: Secondary | ICD-10-CM

## 2021-07-02 LAB — CBG MONITORING, ED: Glucose-Capillary: 58 mg/dL — ABNORMAL LOW (ref 70–99)

## 2021-07-02 MED ORDER — LACTATED RINGERS IV SOLN: INTRAVENOUS | Status: DC

## 2021-07-02 NOTE — ED Triage Notes (Addendum)
Pt. BIB [facility] c/o diarrhea x4 days. Pt. Daughter called because the diarrhea was worsening and pt has not been eating. Pt also has a cough that has been getting worse. EMS reports green sputum production. Pt. Was found to have a blood sugar of 62.  EMS VS: BP:142/55 HR:86 O2:99% RA RR:22

## 2021-07-02 NOTE — ED Provider Notes (Signed)
[place] DEPT Provider Note   CSN: [id number] Arrival date & time: 07/02/21  2153     History  Chief Complaint  Patient presents with   Hypoglycemia   Cough   Diarrhea    Clayton [name] is a 88 y.o. male.  88 y/o male with hx of IDDM, HTN, CKD, IDA, CAD s/p PCI, HLD, and COPD presents from SNF for diarrhea. Diarrhea has been constant x 4 days. Patient with at least 6 episodes of watery diarrhea since the afternoon, per daughter. He has had an associated decreased appetite, but no vomiting. Denies abdominal pain, fever. SNF gave patient's scheduled insulin which daughter attributes to lower CBG tonight. He has also had a cough that has been productive of green sputum. No c/o chest pain. Daughter denies recent abx use. No use of anticoagulants.   The history is provided by the patient. No language interpreter was used.  Hypoglycemia Cough Diarrhea     Home Medications Prior to Admission medications   Medication Sig Start Date End Date Taking? Authorizing Provider  ACCU-CHEK SOFTCLIX LANCETS lancets TEST BLOOD SUGAR FOUR TIMES DAILY Patient not taking: Reported on 06/27/2021 07/28/18   Elayne Snare, MD  albuterol (PROVENTIL HFA;VENTOLIN HFA) 108 (90 Base) MCG/ACT inhaler Inhale 1-2 puffs into the lungs every 6 (six) hours as needed for wheezing or shortness of breath.    [provider]  Alcohol Swabs (B-D SINGLE USE SWABS REGULAR) PADS USE 7 SWABS DAILY AS DIRECTED 07/15/20   Elayne Snare, MD  aspirin EC 81 MG tablet Take 1 tablet (81 mg total) by mouth daily. Swallow whole. 11/15/20   Leonie Man, MD  atorvastatin (LIPITOR) 40 MG tablet Take 1 tablet (40 mg total) by mouth daily. Patient taking differently: Take 40 mg by mouth. Jory Sims, Fri and Sat 11/15/20 04/21/21  Leonie Man, MD  cholecalciferol (VITAMIN D) 1000 units tablet Take 2,000 Units by mouth daily.    [provider]  Continuous Blood Gluc Receiver (FREESTYLE  LIBRE 14 DAY READER) DEVI 1 each by Does not apply route every 14 (fourteen) days. Use reader to monitor blood sugar continuously with freestyle libre sensor. 08/19/20   Elayne Snare, MD  Continuous Blood Gluc Sensor (FREESTYLE LIBRE 14 DAY SENSOR) MISC USE AS DIRECTED EVERY 14 DAYS TO MONITOR BLOOD SUGAR CONTINUOUSLY 12/26/20   Elayne Snare, MD  finasteride (PROSCAR) 5 MG tablet TAKE 1 TABLET EVERY DAY 04/23/19   Elayne Snare, MD  furosemide (LASIX) 20 MG tablet Take 1 tablet (20 mg total) by mouth 2 (two) times daily. Take seocnd dose about 12 noon each daily 12/14/19   Leonie Man, MD  glucose blood test strip Use as instructed to test blood sugars 4 times daily 06/09/18   Elayne Snare, MD  insulin aspart (NOVOLOG FLEXPEN) 100 UNIT/ML FlexPen INJECT 7 UNITS SUBCUTANEOUS EVERY MORNING, 6 UNITS AT LUNCH, AND 9 UNITS AT SUPPER Patient taking differently: INJECT 6 UNITS SUBCUTANEOUS EVERY MORNING, 6 UNITS AT LUNCH, AND 8 UNITS AT SUPPER 05/01/21   Elayne Snare, MD  insulin glargine (LANTUS SOLOSTAR) 100 UNIT/ML Solostar Pen Inject 0.16 mLs (16 Units total) into the skin daily. Patient taking differently: Inject 0.20 mLs (20 Units total) into the skin daily. 05/01/21   Elayne Snare, MD  isosorbide mononitrate (IMDUR) 30 MG 24 hr tablet Take 1 tablet (30 mg total) by mouth daily. 03/16/21   Leonie Man, MD  Melatonin 5 MG TABS Take 2 tablets by mouth at  bedtime.     [provider]  Multiple Vitamin (MULTIVITAMIN WITH MINERALS) TABS tablet Take 1 tablet by mouth daily.    [provider]  nitroGLYCERIN (NITROSTAT) 0.4 MG SL tablet Place 1 tablet (0.4 mg total) under the tongue every 5 (five) minutes as needed for chest pain. 05/21/19 04/21/21  Leonie Man, MD  omeprazole (PRILOSEC) 20 MG capsule Take 1 capsule (20 mg total) by mouth daily. 11/17/18   Doran Stabler, MD  oxybutynin (DITROPAN) 5 MG tablet Take 5 mg by mouth daily. 02/06/19   [provider]  E. Lopez X 5/16" 0.3 ML MISC USE THREE TIMES DAILY AS NEEDED 01/16/21   Elayne Snare, MD      Allergies    Doxycycline, Levaquin [levofloxacin in d5w], Aliskiren, Lisinopril, Losartan potassium, Nitrous oxide, Penicillins, Tape, and Diltiazem    Review of Systems   Review of Systems  Respiratory:  Positive for cough.   Gastrointestinal:  Positive for diarrhea.  Ten systems reviewed and are negative for acute change, except as noted in the HPI.    Physical Exam Updated Vital Signs BP (!) 136/48    Pulse 77    Temp 98.3 F (36.8 C) (Oral)    Resp (!) 22    SpO2 96%   Physical Exam Vitals and nursing note reviewed.  Constitutional:      General: He is not in acute distress.    Appearance: He is well-developed. He is not diaphoretic.     Comments: Chronically ill appearing, but in NAD. Pleasant.  HENT:     Head: Normocephalic and atraumatic.  Eyes:     General: No scleral icterus.    Conjunctiva/sclera: Conjunctivae normal.  Pulmonary:     Effort: Pulmonary effort is normal. No respiratory distress.     Comments: Respirations even and unlabored.  Rhonchorous breath sounds without wheezing. Abdominal:     Palpations: Abdomen is soft. There is no mass.     Tenderness: There is no guarding.     Comments: Minimal left abdominal TTP without guarding or peritoneal signs. Abdomen soft.   Musculoskeletal:        General: Normal range of motion.     Cervical back: Normal range of motion.  Skin:    General: Skin is warm and dry.     Coloration: Skin is not pale.     Findings: No erythema or rash.  Neurological:     Mental Status: He is alert and oriented to person, place, and time.     Coordination: Coordination normal.     Comments: GCS 15. Follows commands. Speech clear.  Psychiatric:        Behavior: Behavior normal.      ED Results / Procedures / Treatments   Labs (all labs ordered are listed, but only abnormal results are displayed) Labs Reviewed  CBC WITH  DIFFERENTIAL/PLATELET - Abnormal; Notable for the following components:      Result Value   WBC 17.1 (*)    RBC 3.32 (*)    Hemoglobin 8.7 (*)    HCT 28.9 (*)    RDW 17.4 (*)    Platelets 420 (*)    Neutro Abs 13.8 (*)    Monocytes Absolute 2.2 (*)    Abs Immature Granulocytes 0.11 (*)    All other components within normal limits  COMPREHENSIVE METABOLIC PANEL - Abnormal; Notable for the following components:   Potassium 3.2 (*)    CO2 19 (*)  BUN 87 (*)    Creatinine, Ser 3.03 (*)    Calcium 8.2 (*)    Albumin 3.1 (*)    GFR, Estimated 19 (*)    All other components within normal limits  URINALYSIS, ROUTINE W REFLEX MICROSCOPIC - Abnormal; Notable for the following components:   Hgb urine dipstick SMALL (*)    Protein, ur 100 (*)    All other components within normal limits  CBG MONITORING, ED - Abnormal; Notable for the following components:   Glucose-Capillary 58 (*)    All other components within normal limits  RESP PANEL BY RT-PCR (FLU A&B, COVID) [id number]  GASTROINTESTINAL PANEL BY PCR, STOOL (REPLACES STOOL CULTURE)  C DIFFICILE QUICK SCREEN W PCR REFLEX    EXPECTORATED SPUTUM ASSESSMENT W GRAM STAIN, RFLX TO RESP C  LIPASE, BLOOD  CBG MONITORING, ED    EKG EKG Interpretation  Date/Time:  Sunday July 02 2021 23:05:55 EST Ventricular Rate:  83 PR Interval:  63 QRS Duration: 98 QT Interval:  412 QTC Calculation: 485 R Axis:   6 Text Interpretation: Sinus rhythm Short PR interval Nonspecific T abnormalities, lateral leads Borderline prolonged QT interval Confirmed by Quintella Reichert [phone]) on 07/02/2021 11:09:02 PM  Radiology CT ABDOMEN PELVIS WO CONTRAST  Result Date: 07/03/2021 CLINICAL DATA:  Recently increasing cough and abdominal pain. Not eating according to daughter. EXAM: CT ABDOMEN AND PELVIS WITHOUT CONTRAST TECHNIQUE: Multidetector CT imaging of the abdomen and pelvis was performed following the standard protocol without IV contrast. RADIATION DOSE  REDUCTION: This exam was performed according to the departmental dose-optimization program which includes automated exposure control, adjustment of the mA and/or kV according to patient size and/or use of iterative reconstruction technique. COMPARISON:  Portable chest today and 05/15/2021, pelvic CT no contrast 03/28/2021. CT chest, abdomen and pelvis no contrast 08/15/2020 FINDINGS: Lower chest: There is increasingly dense consolidation in the right greater than left lower lobes. There is less extensive consolidation previously. Findings may suggest acute on chronic aspiration. Background bronchiectasis in lower lobes with scattered bronchial impaction, tree-in-bud interstitial changes above the levels of dense consolidation Remaining lung bases are clear with COPD change. There is three-vessel calcific CAD, normal cardiac size with low-density of the intracardiac blood pool consistent with anemia. Trace pleural effusions. Hepatobiliary: No focal liver abnormality is seen. No gallstones, gallbladder wall thickening, or biliary dilatation. Pancreas: There is a 1 cm calcification in the head of the pancreas. The previous study demonstrated a 3 mm calcification in this location. There is prominence of the proximal pancreatic duct but the mid to distal pancreatic duct is not dilated. There is no adjacent inflammatory change. Spleen: Unremarkable without contrast. Adrenals/Urinary Tract: There is no adrenal mass. There are small renal cysts. No suspicious abnormality of unenhanced renal cortex. There is no evidence of urinary stones or hydronephrosis. There is moderate severe dilatation of the bladder with the dome reaching the mid L4 level. No bladder thickening is seen. Stomach/Bowel: Gastric wall is contracted. There is no small bowel obstruction or inflammation. Normal appendix. There are fluid levels in the ascending colon, moderate stool retention transverse and descending segment. Scattered sigmoid diverticula  without evidence of diverticulitis. Vascular/Lymphatic: Aortic atherosclerosis. No enlarged abdominal or pelvic lymph nodes. Reproductive: The prostate is not enlarged. Other: Small umbilical and inguinal fat hernias. No free air, hemorrhage or fluid. Musculoskeletal: Osteopenia and degenerative change thoracolumbar spine, multilevel bridging thoracic spine enthesopathy. Grade 1 degenerative L4-5 anterolisthesis with advanced facet hypertrophy. Ankylosis both SI joints. IMPRESSION: 1. Worsening  left-greater-than-right lower lobe consolidation, consider acute on chronic aspiration. Background bronchiectasis with scattered mucoid impaction. Anterior lung bases are clear. 2. Constipation, without evidence of small-bowel obstruction. Fluid in the ascending colon. 3. Moderate to severe dilatation of the bladder. No appreciable enlargement of the prostate. Correlate clinically for outlet obstruction. No focal bladder wall thickening. 4. Aortic atherosclerosis. 5. 1 cm calcification in the pancreatic head which is in the location of a previous 3 mm calcification. There is increased prominence of the proximal pancreatic duct. Findings could indicate an enlarging proximal pancreatic ductal stone. No biliary dilatation is seen. Consider MRCP follow-up. 6. Osteopenia and degenerative change. Electronically Signed   By: Telford Nab M.D.   On: 07/03/2021 01:03   DG Chest Port 1 View  Result Date: 07/02/2021 CLINICAL DATA:  Diarrhea for 4 days EXAM: PORTABLE CHEST 1 VIEW COMPARISON:  05/15/2021 FINDINGS: Bibasilar opacities, left-greater-than-right. Small left pleural effusion. Cardiomediastinal contours are normal. IMPRESSION: Bibasilar atelectasis and small left pleural effusion. Electronically Signed   By: Ulyses Jarred M.D.   On: 07/02/2021 23:18    Procedures Procedures    Medications Ordered in ED Medications  lactated ringers infusion ( Intravenous New Bag/Given 07/02/21 2351)  metroNIDAZOLE (FLAGYL) IVPB  500 mg (500 mg Intravenous New Bag/Given 07/03/21 0525)  potassium chloride 10 mEq in 100 mL IVPB (0 mEq Intravenous Stopped 07/03/21 0556)  cefTRIAXone (ROCEPHIN) 2 g in sodium chloride 0.9 % 100 mL IVPB (2 g Intravenous New Bag/Given 07/03/21 0525)    ED Course/ Medical Decision Making/ A&P Clinical Course as of 07/03/21 0558  Mon Jul 03, 2021  0031 CMP reviewed.  Will give IV potassium for hypokalemia.  He has a mildly low bicarb which is likely secondary to GI losses and ongoing diarrhea.  Anion gap is normal.  Kidney function consistent with baseline. [KH]  0103 CBC notable for leukocytosis with left shift suspicious for infectious etiology. CT pending. [KH]  0343 UA reviewed and is negative for UTI. Patient able to void on own in the ED. [KH]    Clinical Course User Index [KH] Antonietta Breach, PA-C                           Medical Decision Making  This patient presents to the ED for concern of diarrhea and cough, this involves an extensive number of treatment options, and is a complaint that carries with it a high risk of complications and morbidity.  The differential diagnosis includes infectious diarrhea vs viral illness vs diverticulitis vs colitis. With respect to cough - PNA vs viral respiratory illness vs aspiration vs COPD exacerbation vs CHF vs GERD considered.   Co morbidities that complicate the patient evaluation  DM, HTN, CKD, COPD, CAD   Additional history obtained:  Additional history obtained from daughter   Lab Tests:  I Ordered, and personally interpreted labs.  The pertinent results include:  leukocytosis of 17.1, normal anion gap acidosis, hypoglycemia to 58. Anemia and creatinine are stable in comparison with baseline. C diff studies and GI pathogen panel are pending.   Imaging Studies ordered:  I ordered imaging studies including CXR, CT abdomen/pelvis  I independently visualized and interpreted imaging which showed suspected acute on chronic aspiration  PNA. No evidence of abdominal obstruction or intraabdominal infection. Do not feel CT abdomen/pelvis conveys constipation, but otherwise agree with the radiologist interpretation   Cardiac Monitoring:  The patient was maintained on a cardiac monitor.  I personally viewed  and interpreted the cardiac monitored which showed an underlying rhythm of: NSR   Medicines ordered and prescription drug management:  I ordered medication including Rocephin and Flagyl for AoC aspiration, IV potassium for hypokalemia, IVF for hydration given persistent diarrhea  Reevaluation of the patient after these medicines showed that the patient  has remained stable I have reviewed the patients home medicines and have made adjustments as needed   Reevaluation:  After the interventions noted above, I reevaluated the patient and found that they have : remained stable   Social Determinants of Health:  Family at bedside; good social support   Dispostion:  After consideration of the diagnostic results and the patients response to treatment, I feel that the patent would benefit from admission for continued abx for management of aspiration PNA. Can follow up on stool studies while inpatient.         Final Clinical Impression(s) / ED Diagnoses Final diagnoses:  Aspiration pneumonia, unspecified aspiration pneumonia type, unspecified laterality, unspecified part of lung ([place])  Diarrhea, unspecified type    Rx / DC Orders ED Discharge Orders     None         Antonietta Breach, PA-C 07/03/21 0223    Quintella Reichert, MD 07/04/21 [phone]

## 2021-07-03 ENCOUNTER — Emergency Department (HOSPITAL_COMMUNITY): Payer: HMO

## 2021-07-03 ENCOUNTER — Ambulatory Visit: Payer: HMO | Admitting: Physician Assistant

## 2021-07-03 DIAGNOSIS — I5042 Chronic combined systolic (congestive) and diastolic (congestive) heart failure: Secondary | ICD-10-CM | POA: Diagnosis present

## 2021-07-03 DIAGNOSIS — Z20822 Contact with and (suspected) exposure to covid-19: Secondary | ICD-10-CM | POA: Diagnosis present

## 2021-07-03 DIAGNOSIS — J690 Pneumonitis due to inhalation of food and vomit: Secondary | ICD-10-CM | POA: Diagnosis present

## 2021-07-03 DIAGNOSIS — E1122 Type 2 diabetes mellitus with diabetic chronic kidney disease: Secondary | ICD-10-CM | POA: Diagnosis present

## 2021-07-03 DIAGNOSIS — I4819 Other persistent atrial fibrillation: Secondary | ICD-10-CM | POA: Diagnosis present

## 2021-07-03 DIAGNOSIS — J9811 Atelectasis: Secondary | ICD-10-CM | POA: Diagnosis present

## 2021-07-03 DIAGNOSIS — J189 Pneumonia, unspecified organism: Secondary | ICD-10-CM | POA: Diagnosis not present

## 2021-07-03 DIAGNOSIS — D631 Anemia in chronic kidney disease: Secondary | ICD-10-CM | POA: Diagnosis present

## 2021-07-03 DIAGNOSIS — Z66 Do not resuscitate: Secondary | ICD-10-CM | POA: Diagnosis present

## 2021-07-03 DIAGNOSIS — E162 Hypoglycemia, unspecified: Secondary | ICD-10-CM | POA: Diagnosis present

## 2021-07-03 DIAGNOSIS — E876 Hypokalemia: Secondary | ICD-10-CM | POA: Diagnosis present

## 2021-07-03 DIAGNOSIS — N184 Chronic kidney disease, stage 4 (severe): Secondary | ICD-10-CM | POA: Diagnosis present

## 2021-07-03 DIAGNOSIS — E11649 Type 2 diabetes mellitus with hypoglycemia without coma: Secondary | ICD-10-CM | POA: Diagnosis present

## 2021-07-03 DIAGNOSIS — J9601 Acute respiratory failure with hypoxia: Secondary | ICD-10-CM | POA: Diagnosis present

## 2021-07-03 DIAGNOSIS — N400 Enlarged prostate without lower urinary tract symptoms: Secondary | ICD-10-CM | POA: Diagnosis present

## 2021-07-03 DIAGNOSIS — E785 Hyperlipidemia, unspecified: Secondary | ICD-10-CM | POA: Diagnosis present

## 2021-07-03 DIAGNOSIS — E441 Mild protein-calorie malnutrition: Secondary | ICD-10-CM | POA: Diagnosis present

## 2021-07-03 DIAGNOSIS — I2510 Atherosclerotic heart disease of native coronary artery without angina pectoris: Secondary | ICD-10-CM | POA: Diagnosis present

## 2021-07-03 DIAGNOSIS — L89153 Pressure ulcer of sacral region, stage 3: Secondary | ICD-10-CM | POA: Diagnosis present

## 2021-07-03 DIAGNOSIS — E872 Acidosis, unspecified: Secondary | ICD-10-CM | POA: Diagnosis present

## 2021-07-03 DIAGNOSIS — I130 Hypertensive heart and chronic kidney disease with heart failure and stage 1 through stage 4 chronic kidney disease, or unspecified chronic kidney disease: Secondary | ICD-10-CM | POA: Diagnosis present

## 2021-07-03 DIAGNOSIS — J449 Chronic obstructive pulmonary disease, unspecified: Secondary | ICD-10-CM | POA: Diagnosis present

## 2021-07-03 DIAGNOSIS — R197 Diarrhea, unspecified: Secondary | ICD-10-CM | POA: Diagnosis present

## 2021-07-03 DIAGNOSIS — R9431 Abnormal electrocardiogram [ECG] [EKG]: Secondary | ICD-10-CM | POA: Diagnosis present

## 2021-07-03 DIAGNOSIS — N179 Acute kidney failure, unspecified: Secondary | ICD-10-CM | POA: Diagnosis present

## 2021-07-03 DIAGNOSIS — Z79899 Other long term (current) drug therapy: Secondary | ICD-10-CM | POA: Diagnosis not present

## 2021-07-03 DIAGNOSIS — E1151 Type 2 diabetes mellitus with diabetic peripheral angiopathy without gangrene: Secondary | ICD-10-CM | POA: Diagnosis present

## 2021-07-03 DIAGNOSIS — B965 Pseudomonas (aeruginosa) (mallei) (pseudomallei) as the cause of diseases classified elsewhere: Secondary | ICD-10-CM | POA: Diagnosis present

## 2021-07-03 LAB — COMPREHENSIVE METABOLIC PANEL
ALT: 18 U/L (ref 0–44)
AST: 29 U/L (ref 15–41)
Albumin: 3.1 g/dL — ABNORMAL LOW (ref 3.5–5.0)
Alkaline Phosphatase: 85 U/L (ref 38–126)
Anion gap: 12 (ref 5–15)
BUN: 87 mg/dL — ABNORMAL HIGH (ref 8–23)
CO2: 19 mmol/L — ABNORMAL LOW (ref 22–32)
Calcium: 8.2 mg/dL — ABNORMAL LOW (ref 8.9–10.3)
Chloride: 107 mmol/L (ref 98–111)
Creatinine, Ser: 3.03 mg/dL — ABNORMAL HIGH (ref 0.61–1.24)
GFR, Estimated: 19 mL/min — ABNORMAL LOW (ref >60)
Glucose, Bld: 81 mg/dL (ref 70–99)
Potassium: 3.2 mmol/L — ABNORMAL LOW (ref 3.5–5.1)
Sodium: 138 mmol/L (ref 135–145)
Total Bilirubin: 0.4 mg/dL (ref 0.3–1.2)
Total Protein: 7.1 g/dL (ref 6.5–8.1)

## 2021-07-03 LAB — CBC WITH DIFFERENTIAL/PLATELET
Abs Immature Granulocytes: 0.11 10*3/uL — ABNORMAL HIGH (ref 0.00–0.07)
Basophils Absolute: 0.1 10*3/uL (ref 0.0–0.1)
Basophils Relative: 0 %
Eosinophils Absolute: 0.3 10*3/uL (ref 0.0–0.5)
Eosinophils Relative: 2 %
HCT: 28.9 % — ABNORMAL LOW (ref 39.0–52.0)
Hemoglobin: 8.7 g/dL — ABNORMAL LOW (ref 13.0–17.0)
Immature Granulocytes: 1 %
Lymphocytes Relative: 4 %
Lymphs Abs: 0.7 10*3/uL (ref 0.7–4.0)
MCH: 26.2 pg (ref 26.0–34.0)
MCHC: 30.1 g/dL (ref 30.0–36.0)
MCV: 87 fL (ref 80.0–100.0)
Monocytes Absolute: 2.2 10*3/uL — ABNORMAL HIGH (ref 0.1–1.0)
Monocytes Relative: 13 %
Neutro Abs: 13.8 10*3/uL — ABNORMAL HIGH (ref 1.7–7.7)
Neutrophils Relative %: 80 %
Platelets: 420 10*3/uL — ABNORMAL HIGH (ref 150–400)
RBC: 3.32 MIL/uL — ABNORMAL LOW (ref 4.22–5.81)
RDW: 17.4 % — ABNORMAL HIGH (ref 11.5–15.5)
WBC: 17.1 10*3/uL — ABNORMAL HIGH (ref 4.0–10.5)
nRBC: 0 % (ref 0.0–0.2)

## 2021-07-03 LAB — URINALYSIS, ROUTINE W REFLEX MICROSCOPIC
Bacteria, UA: NONE SEEN
Bilirubin Urine: NEGATIVE
Glucose, UA: NEGATIVE mg/dL
Ketones, ur: NEGATIVE mg/dL
Leukocytes,Ua: NEGATIVE
Nitrite: NEGATIVE
Protein, ur: 100 mg/dL — AB
Specific Gravity, Urine: 1.011 (ref 1.005–1.030)
pH: 5 (ref 5.0–8.0)

## 2021-07-03 LAB — CBG MONITORING, ED
Glucose-Capillary: 92 mg/dL (ref 70–99)
Glucose-Capillary: 59 mg/dL — ABNORMAL LOW (ref 70–99)

## 2021-07-03 LAB — GLUCOSE, CAPILLARY
Glucose-Capillary: 87 mg/dL (ref 70–99)
Glucose-Capillary: 79 mg/dL (ref 70–99)
Glucose-Capillary: 133 mg/dL — ABNORMAL HIGH (ref 70–99)
Glucose-Capillary: 166 mg/dL — ABNORMAL HIGH (ref 70–99)

## 2021-07-03 LAB — LIPASE, BLOOD: Lipase: 29 U/L (ref 11–51)

## 2021-07-03 LAB — EXPECTORATED SPUTUM ASSESSMENT W GRAM STAIN, RFLX TO RESP C

## 2021-07-03 LAB — RESP PANEL BY RT-PCR (FLU A&B, COVID) ARPGX2
Influenza A by PCR: NEGATIVE
Influenza B by PCR: NEGATIVE
SARS Coronavirus 2 by RT PCR: NEGATIVE

## 2021-07-03 MED ORDER — BACITRACIN-NEOMYCIN-POLYMYXIN 400-5-5000 EX OINT: 1.0000 "application " | TOPICAL_OINTMENT | Freq: Two times a day (BID) | CUTANEOUS | Status: DC | PRN

## 2021-07-03 MED ORDER — ADULT MULTIVITAMIN W/MINERALS CH
1.0000 | ORAL_TABLET | Freq: Every day | ORAL | Status: DC
  Administered 2021-07-03 – 2021-07-09 (×7): 1 via ORAL
  Filled 2021-07-03 (×7): qty 1

## 2021-07-03 MED ORDER — PANTOPRAZOLE SODIUM 40 MG PO TBEC: 40.0000 mg | DELAYED_RELEASE_TABLET | Freq: Every day | ORAL | Status: DC

## 2021-07-03 MED ORDER — FUROSEMIDE 20 MG PO TABS
20.0000 mg | ORAL_TABLET | Freq: Two times a day (BID) | ORAL | Status: DC
  Administered 2021-07-03 – 2021-07-09 (×12): 20 mg via ORAL
  Filled 2021-07-03 (×12): qty 1

## 2021-07-03 MED ORDER — ZINC OXIDE 40 % EX OINT
TOPICAL_OINTMENT | CUTANEOUS | Status: DC | PRN
  Filled 2021-07-03: qty 57

## 2021-07-03 MED ORDER — BOOST / RESOURCE BREEZE PO LIQD CUSTOM
1.0000 | Freq: Two times a day (BID) | ORAL | Status: DC
  Administered 2021-07-03 – 2021-07-09 (×11): 1 via ORAL

## 2021-07-03 MED ORDER — MONTELUKAST SODIUM 10 MG PO TABS
10.0000 mg | ORAL_TABLET | Freq: Every day | ORAL | Status: DC
  Administered 2021-07-03 – 2021-07-08 (×6): 10 mg via ORAL
  Filled 2021-07-03 (×6): qty 1

## 2021-07-03 MED ORDER — METRONIDAZOLE 500 MG/100ML IV SOLN
500.0000 mg | Freq: Three times a day (TID) | INTRAVENOUS | Status: DC
  Administered 2021-07-03 – 2021-07-06 (×10): 500 mg via INTRAVENOUS
  Filled 2021-07-03 (×11): qty 100

## 2021-07-03 MED ORDER — SODIUM CHLORIDE 0.9 % IV SOLN
2.0000 g | Freq: Once | INTRAVENOUS | Status: AC
  Administered 2021-07-03: 2 g via INTRAVENOUS
  Filled 2021-07-03: qty 20

## 2021-07-03 MED ORDER — FINASTERIDE 5 MG PO TABS
5.0000 mg | ORAL_TABLET | Freq: Every day | ORAL | Status: DC
Start: 2021-07-03 — End: 2021-07-09
  Administered 2021-07-03 – 2021-07-09 (×7): 5 mg via ORAL
  Filled 2021-07-03 (×7): qty 1

## 2021-07-03 MED ORDER — NITROGLYCERIN 0.4 MG SL SUBL: 0.4000 mg | SUBLINGUAL_TABLET | SUBLINGUAL | Status: DC | PRN

## 2021-07-03 MED ORDER — SODIUM CHLORIDE 0.9 % IV SOLN
2.0000 g | INTRAVENOUS | Status: DC
  Administered 2021-07-04 – 2021-07-05 (×2): 2 g via INTRAVENOUS
  Filled 2021-07-03 (×2): qty 20

## 2021-07-03 MED ORDER — IPRATROPIUM-ALBUTEROL 0.5-2.5 (3) MG/3ML IN SOLN
3.0000 mL | RESPIRATORY_TRACT | Status: DC | PRN
  Administered 2021-07-03: 3 mL via RESPIRATORY_TRACT
  Filled 2021-07-03: qty 3

## 2021-07-03 MED ORDER — VITAMIN D 25 MCG (1000 UNIT) PO TABS
2000.0000 [IU] | ORAL_TABLET | Freq: Every day | ORAL | Status: DC
  Administered 2021-07-03 – 2021-07-09 (×7): 2000 [IU] via ORAL
  Filled 2021-07-03 (×7): qty 2

## 2021-07-03 MED ORDER — POTASSIUM CHLORIDE CRYS ER 10 MEQ PO TBCR
10.0000 meq | EXTENDED_RELEASE_TABLET | Freq: Once | ORAL | Status: AC
  Administered 2021-07-03: 10 meq via ORAL
  Filled 2021-07-03: qty 1

## 2021-07-03 MED ORDER — ASPIRIN EC 81 MG PO TBEC
81.0000 mg | DELAYED_RELEASE_TABLET | Freq: Every day | ORAL | Status: DC
Start: 2021-07-03 — End: 2021-07-09
  Administered 2021-07-03 – 2021-07-09 (×7): 81 mg via ORAL
  Filled 2021-07-03 (×7): qty 1

## 2021-07-03 MED ORDER — MOMETASONE FURO-FORMOTEROL FUM 200-5 MCG/ACT IN AERO
2.0000 | INHALATION_SPRAY | Freq: Two times a day (BID) | RESPIRATORY_TRACT | Status: DC
  Administered 2021-07-03 – 2021-07-09 (×11): 2 via RESPIRATORY_TRACT
  Filled 2021-07-03: qty 8.8

## 2021-07-03 MED ORDER — ISOSORBIDE MONONITRATE ER 30 MG PO TB24
30.0000 mg | ORAL_TABLET | Freq: Every day | ORAL | Status: DC
  Administered 2021-07-03 – 2021-07-09 (×7): 30 mg via ORAL
  Filled 2021-07-03 (×7): qty 1

## 2021-07-03 MED ORDER — ACETAMINOPHEN 650 MG RE SUPP: 650.0000 mg | Freq: Four times a day (QID) | RECTAL | Status: DC | PRN

## 2021-07-03 MED ORDER — MELATONIN 5 MG PO TABS
10.0000 mg | ORAL_TABLET | Freq: Every day | ORAL | Status: DC
Start: 2021-07-03 — End: 2021-07-03

## 2021-07-03 MED ORDER — POTASSIUM CHLORIDE 10 MEQ/100ML IV SOLN
10.0000 meq | Freq: Once | INTRAVENOUS | Status: AC
  Administered 2021-07-03: 10 meq via INTRAVENOUS
  Filled 2021-07-03: qty 100

## 2021-07-03 MED ORDER — POLYETHYLENE GLYCOL 3350 17 G PO PACK
17.0000 g | PACK | Freq: Every day | ORAL | Status: DC
  Administered 2021-07-03: 17 g via ORAL
  Filled 2021-07-03: qty 1

## 2021-07-03 MED ORDER — ATORVASTATIN CALCIUM 40 MG PO TABS
40.0000 mg | ORAL_TABLET | ORAL | Status: DC
  Administered 2021-07-05 – 2021-07-08 (×3): 40 mg via ORAL
  Filled 2021-07-03 (×3): qty 1

## 2021-07-03 MED ORDER — IPRATROPIUM-ALBUTEROL 0.5-2.5 (3) MG/3ML IN SOLN: 3.0000 mL | Freq: Four times a day (QID) | RESPIRATORY_TRACT | Status: DC

## 2021-07-03 MED ORDER — MELATONIN 5 MG PO TABS
5.0000 mg | ORAL_TABLET | Freq: Every day | ORAL | Status: DC
  Administered 2021-07-03 – 2021-07-08 (×6): 5 mg via ORAL
  Filled 2021-07-03 (×6): qty 1

## 2021-07-03 MED ORDER — ACETAMINOPHEN 325 MG PO TABS
650.0000 mg | ORAL_TABLET | Freq: Four times a day (QID) | ORAL | Status: DC | PRN
  Administered 2021-07-03: 650 mg via ORAL
  Filled 2021-07-03 (×2): qty 2

## 2021-07-03 MED ORDER — DEXTROSE 50 % IV SOLN: 25.0000 g | INTRAVENOUS | Status: DC | PRN

## 2021-07-03 NOTE — H&P (Signed)
History and Physical    Clayton [name] UXN:[id number] DOB: [birth year] DOA: 07/02/2021  PCP: Lujean Amel, MD   Patient coming from: SNF.  I have personally briefly reviewed patient's old medical records in [place]  Chief Complaint:  Diarrhea.  HPI: Clayton [name] is a 88 y.o. male with medical history significant of BPH, CAD, COPD, lumbar DDD, type II DM, hyperlipidemia, hyperparathyroidism, hypertension, iron deficiency anemia, OSA not on CPAP, osteomyelitis, osteoporosis, second-degree AV block who is coming to the emergency department from his residential facility due to diarrhea for the past 4 days.  He also has been coughing greenish sputum and Felt short of breath.  He is somnolent at this time, but able to answer simple questions.  He is oriented to person, place, partially oriented to situation, time and date.  He is very sharp baseline per [name].  ED Course: Initial vital signs were temperature 97.5 F, pulse 88, respirations 22, BP 160/72 mmHg O2 sat 78% on room air.  The patient received ceftriaxone and metronidazole in the emergency department.  Lab work: CBC showed a white count of 17.1, hemoglobin 9.7 g/dL platelets 420.  CMP showed a potassium of 3.2 and CO2 of 19 mmol/L.  The rest of the electrolytes are within normal range when calcium is corrected to albumin.  BUN was 87 and creatinine was 3.00 mg/dL.  This is close to her baseline.  LFTs were normal except for an albumin level of 3.1 g/dL..  Imaging: A 1 view chest radiograph show bibasilar atelectasis and small left pleural effusion.  CT abdomen/pelvis without contrast show worsening left greater than right lower lobe consolidation consider acute on chronic aspiration.  There is background bronchiectasis with scattered mucoid impaction.  Anterior lung bases are clear.  There was constipation with evidence of SBO.  There was fluid in the ascending colon.  Moderate to severe dilatation of the bladder.  No  enlargement of the prostate.  There was aortic atherosclerosis.  Increasing size of pancreatic head calcifications when compared to previous from 3 mm to 1 cm.  Please see images and full radiology report for further details.  Review of Systems: As per HPI otherwise all other systems reviewed and are negative.  Past Medical History:  Diagnosis Date   Anemia    BPH (benign prostatic hyperplasia)    CAD (coronary artery disease)    hx of stemi  04-2018    Chronic renal failure    Chronic renal insufficiency    COPD (chronic obstructive pulmonary disease) (HCC)    DDD (degenerative disc disease), lumbar    Diabetes mellitus    Hyperlipidemia    Hyperparathyroidism ([place])    Hypertension    IDA (iron deficiency anemia)    Neuromuscular disorder (HCC)    OSA on CPAP    Osteomyelitis of forearm (HCC)    Osteoporosis    Wenckebach second degree AV block 11/2020   Event monitor showed Darden Amber block intermittently as well as first-degree block.  Rare occasional PACs and PVCs.  Minimum heart rate 32 bpm at early morning hours.  Maximal heart rate 112 bpm, average 76 bpm.    Past Surgical History:  Procedure Laterality Date   CORONARY STENT INTERVENTION N/A 04/20/2018   Procedure: CORONARY STENT INTERVENTION;  Surgeon: Leonie Man, MD;  Location: [facility];;;     CORONARY/GRAFT ACUTE MI REVASCULARIZATION N/A 04/20/2018   Procedure: Coronary/Graft Acute MI Revascularization;  Surgeon: Leonie Man, MD;  Location:  [facility];  Service: Cardiovascular;  Laterality: N/A; --> after initial PTCA restoring flow down the RCA, there was diffuse proximal to mid disease treated with a DES SYNERGY 3 X 38 ( 3.16mm)   EYE SURGERY     HOLTER MONITOR  05/2018   Sinus rhythm noted with sinus bradycardia.  Also sinus rhythm with 2-1 AV block noted.  Maximum heart rate was sinus tachycardia 123 bpm.  Rare PVCs accelerated idioventricular rhythm noted.  Intermittent Wenckebach block  noted.   I & D EXTREMITY Right 08/13/2016   Procedure: IRRIGATION AND DEBRIDEMENT RIGHT ELBOW AND HAND;  Surgeon: Milly Jakob, MD;  Location: [facility];  Service: Orthopedics;  Laterality: Right;   LEFT HEART CATH AND CORONARY ANGIOGRAPHY N/A 04/20/2018   Procedure: LEFT HEART CATH AND CORONARY ANGIOGRAPHY;  Surgeon: Leonie Man, MD;  Location: [facility]; mRCA 100%&80% (after 55% tapering in pRCA), mLM-ostLAD 40% w/ 85% ostCx, ost-pCx 65% @ Om1 w/ ost 85%. pLAD 60% & 50% after D1 with distal 75%, ostD1 65% & 90% after small side branch.     TRANSTHORACIC ECHOCARDIOGRAM  04/21/2018   EF 50-55%. Posterior Wall HK. Severe RV HK, dilated IVC. Temp wire in place   VASECTOMY     VIDEO BRONCHOSCOPY Bilateral 08/25/2012   Procedure: VIDEO BRONCHOSCOPY WITHOUT FLUORO;  Surgeon: Brand Males, MD;  Location: [facility];  Service: Cardiopulmonary;  Laterality: Bilateral;    Social History  reports that he quit smoking about 48 years ago. His smoking use included cigarettes. He has a 99.00 pack-year smoking history. He quit smokeless tobacco use about 45 years ago. He reports that he does not drink alcohol and does not use drugs.  Allergies  Allergen Reactions   Doxycycline Rash   Levaquin [Levofloxacin In D5w]     Caused chest pain and heartburn   Aliskiren Other (See Comments)    Unknown per Daughter    Lisinopril Other (See Comments)    Unknown per Daughter    Losartan Potassium Other (See Comments)   Nitrous Oxide Other (See Comments)    Reaction:  Unknown    Penicillins Other (See Comments)    Reaction:  Unknown Has patient had a PCN reaction causing immediate rash, facial/tongue/throat swelling, SOB or lightheadedness with hypotension: Unsure Has patient had a PCN reaction causing severe rash involving mucus membranes or skin necrosis: Unsure Has patient had a PCN reaction that required hospitalization Unsure  Has patient had a PCN reaction occurring within the last 10  years: No If all of the above answers are "NO", then may proceed with Cephalosporin use.   Tape Other (See Comments)    Reaction:  Tears pts skin    Diltiazem Rash   Family History  Problem Relation Age of Onset   Diabetes Mellitus II Other    Hypertension Other    Other Father        complications from strep   CVA Father    Cancer Sister    Other Brother        hip replacement complications    Prior to Admission medications   Medication Sig Start Date End Date Taking? Authorizing Provider  ACCU-CHEK SOFTCLIX LANCETS lancets TEST BLOOD SUGAR FOUR TIMES DAILY Patient not taking: Reported on 06/27/2021 07/28/18   Elayne Snare, MD  albuterol (PROVENTIL HFA;VENTOLIN HFA) 108 (90 Base) MCG/ACT inhaler Inhale 1-2 puffs into the lungs every 6 (six) hours as needed for wheezing or shortness of breath.    [provider]  Alcohol Swabs (B-D SINGLE USE SWABS REGULAR) PADS USE 7 SWABS DAILY AS DIRECTED 07/15/20   Elayne Snare, MD  aspirin EC 81 MG tablet Take 1 tablet (81 mg total) by mouth daily. Swallow whole. 11/15/20   Leonie Man, MD  atorvastatin (LIPITOR) 40 MG tablet Take 1 tablet (40 mg total) by mouth daily. Patient taking differently: Take 40 mg by mouth. Jory Sims, Fri and Sat 11/15/20 04/21/21  Leonie Man, MD  cholecalciferol (VITAMIN D) 1000 units tablet Take 2,000 Units by mouth daily.    [provider]  Continuous Blood Gluc Receiver (FREESTYLE LIBRE 14 DAY READER) DEVI 1 each by Does not apply route every 14 (fourteen) days. Use reader to monitor blood sugar continuously with freestyle libre sensor. 08/19/20   Elayne Snare, MD  Continuous Blood Gluc Sensor (FREESTYLE LIBRE 14 DAY SENSOR) MISC USE AS DIRECTED EVERY 14 DAYS TO MONITOR BLOOD SUGAR CONTINUOUSLY 12/26/20   Elayne Snare, MD  finasteride (PROSCAR) 5 MG tablet TAKE 1 TABLET EVERY DAY 04/23/19   Elayne Snare, MD  furosemide (LASIX) 20 MG tablet Take 1 tablet (20 mg total) by mouth 2 (two) times daily. Take  seocnd dose about 12 noon each daily 12/14/19   Leonie Man, MD  glucose blood test strip Use as instructed to test blood sugars 4 times daily 06/09/18   Elayne Snare, MD  insulin aspart (NOVOLOG FLEXPEN) 100 UNIT/ML FlexPen INJECT 7 UNITS SUBCUTANEOUS EVERY MORNING, 6 UNITS AT LUNCH, AND 9 UNITS AT SUPPER Patient taking differently: INJECT 6 UNITS SUBCUTANEOUS EVERY MORNING, 6 UNITS AT LUNCH, AND 8 UNITS AT SUPPER 05/01/21   Elayne Snare, MD  insulin glargine (LANTUS SOLOSTAR) 100 UNIT/ML Solostar Pen Inject 0.16 mLs (16 Units total) into the skin daily. Patient taking differently: Inject 0.20 mLs (20 Units total) into the skin daily. 05/01/21   Elayne Snare, MD  isosorbide mononitrate (IMDUR) 30 MG 24 hr tablet Take 1 tablet (30 mg total) by mouth daily. 03/16/21   Leonie Man, MD  Melatonin 5 MG TABS Take 2 tablets by mouth at bedtime.     [provider]  Multiple Vitamin (MULTIVITAMIN WITH MINERALS) TABS tablet Take 1 tablet by mouth daily.    [provider]  nitroGLYCERIN (NITROSTAT) 0.4 MG SL tablet Place 1 tablet (0.4 mg total) under the tongue every 5 (five) minutes as needed for chest pain. 05/21/19 04/21/21  Leonie Man, MD  omeprazole (PRILOSEC) 20 MG capsule Take 1 capsule (20 mg total) by mouth daily. 11/17/18   Doran Stabler, MD  oxybutynin (DITROPAN) 5 MG tablet Take 5 mg by mouth daily. 02/06/19   [provider]  TRUEPLUS INSULIN SYRINGE 31G X 5/16" 0.3 ML MISC USE THREE TIMES DAILY AS NEEDED 01/16/21   Elayne Snare, MD    Physical Exam: Vitals:   07/03/21 0530 07/03/21 0554 07/03/21 0600 07/03/21 0629  BP: (!) 136/48 (!) 136/48 (!) 136/53 (!) 161/49  Pulse: 71 77 77 83  Resp: (!) 21 (!) 22 19 20   Temp:  98.3 F (36.8 C)  98.1 F (36.7 C)  TempSrc:  Oral  Oral  SpO2: 96% 96% 93% 97%    Constitutional: Frail, elderly male.  Chronically ill-appearing.  NAD, calm, comfortable Eyes: PERRL, lids and conjunctivae normal ENMT: Mucous  membranes are moist. Posterior pharynx clear of any exudate or lesions. Neck: normal, supple, no masses, no thyromegaly Respiratory: Good air movement with bilateral rhonchi, no wheezing, no crackles.  Normal respiratory effort. No accessory muscle use.  Cardiovascular: Regular rate and rhythm, no murmurs / rubs / gallops. No extremity edema. 2+ pedal pulses. No carotid bruits.  Abdomen: Obese, no distention.  Soft, no tenderness, no masses palpated. No hepatosplenomegaly. Bowel sounds positive.  Musculoskeletal: no clubbing / cyanosis.  Moderate generalized weakness.  Good ROM, no contractures. Normal muscle tone.  Skin: Stage II sacral pressure ulcer. Neurologic: CN 2-12 grossly intact. Sensation intact, DTR normal. Strength 5/5 in all 4.  Psychiatric: Normal judgment and insight. Alert and oriented x 2, partially oriented to time and situation. Normal mood.      Labs on Admission: I have personally reviewed following labs and imaging studies  CBC: Recent Labs  Lab 07/02/21 2246  WBC 17.1*  NEUTROABS 13.8*  HGB 8.7*  HCT 28.9*  MCV 87.0  PLT 420*    Basic Metabolic Panel: Recent Labs  Lab 07/02/21 2246  NA 138  K 3.2*  CL 107  CO2 19*  GLUCOSE 81  BUN 87*  CREATININE 3.03*  CALCIUM 8.2*    GFR: Estimated Creatinine Clearance: 18.5 mL/min (A) (by C-G formula based on SCr of 3.03 mg/dL (H)).  Liver Function Tests: Recent Labs  Lab 07/02/21 2246  AST 29  ALT 18  ALKPHOS 85  BILITOT 0.4  PROT 7.1  ALBUMIN 3.1*    Urine analysis:    Component Value Date/Time   COLORURINE YELLOW 07/03/2021 0118   APPEARANCEUR CLEAR 07/03/2021 0118   LABSPEC 1.011 07/03/2021 0118   PHURINE 5.0 07/03/2021 0118   GLUCOSEU NEGATIVE 07/03/2021 0118        HGBUR SMALL (A) 07/03/2021 0118   BILIRUBINUR NEGATIVE 07/03/2021 0118   KETONESUR NEGATIVE 07/03/2021 0118   PROTEINUR 100 (A) 07/03/2021 0118        NITRITE NEGATIVE 07/03/2021 0118   LEUKOCYTESUR NEGATIVE 07/03/2021  0118    Radiological Exams on Admission: CT ABDOMEN PELVIS WO CONTRAST  Result Date: 07/03/2021 CLINICAL DATA:  Recently increasing cough and abdominal pain. Not eating according to daughter. EXAM: CT ABDOMEN AND PELVIS WITHOUT CONTRAST TECHNIQUE: Multidetector CT imaging of the abdomen and pelvis was performed following the standard protocol without IV contrast. RADIATION DOSE REDUCTION: This exam was performed according to the departmental dose-optimization program which includes automated exposure control, adjustment of the mA and/or kV according to patient size and/or use of iterative reconstruction technique. COMPARISON:  Portable chest today and 05/15/2021, pelvic CT no contrast 03/28/2021. CT chest, abdomen and pelvis no contrast 08/15/2020 FINDINGS: Lower chest: There is increasingly dense consolidation in the right greater than left lower lobes. There is less extensive consolidation previously. Findings may suggest acute on chronic aspiration. Background bronchiectasis in lower lobes with scattered bronchial impaction, tree-in-bud interstitial changes above the levels of dense consolidation Remaining lung bases are clear with COPD change. There is three-vessel calcific CAD, normal cardiac size with low-density of the intracardiac blood pool consistent with anemia. Trace pleural effusions. Hepatobiliary: No focal liver abnormality is seen. No gallstones, gallbladder wall thickening, or biliary dilatation. Pancreas: There is a 1 cm calcification in the head of the pancreas. The previous study demonstrated a 3 mm calcification in this location. There is prominence of the proximal pancreatic duct but the mid to distal pancreatic duct is not dilated. There is no adjacent inflammatory change. Spleen: Unremarkable without contrast. Adrenals/Urinary Tract: There is no adrenal mass. There are small renal cysts. No suspicious abnormality of unenhanced renal cortex. There is no evidence of urinary stones or  hydronephrosis. There is moderate severe dilatation of the bladder with the dome reaching the mid L4 level. No bladder thickening is seen. Stomach/Bowel: Gastric wall is contracted. There is no small bowel obstruction or inflammation. Normal appendix. There are fluid levels in the ascending colon, moderate stool retention transverse and descending segment. Scattered sigmoid diverticula without evidence of diverticulitis. Vascular/Lymphatic: Aortic atherosclerosis. No enlarged abdominal or pelvic lymph nodes. Reproductive: The prostate is not enlarged. Other: Small umbilical and inguinal fat hernias. No free air, hemorrhage or fluid. Musculoskeletal: Osteopenia and degenerative change thoracolumbar spine, multilevel bridging thoracic spine enthesopathy. Grade 1 degenerative L4-5 anterolisthesis with advanced facet hypertrophy. Ankylosis both SI joints. IMPRESSION: 1. Worsening left-greater-than-right lower lobe consolidation, consider acute on chronic aspiration. Background bronchiectasis with scattered mucoid impaction. Anterior lung bases are clear. 2. Constipation, without evidence of small-bowel obstruction. Fluid in the ascending colon. 3. Moderate to severe dilatation of the bladder. No appreciable enlargement of the prostate. Correlate clinically for outlet obstruction. No focal bladder wall thickening. 4. Aortic atherosclerosis. 5. 1 cm calcification in the pancreatic head which is in the location of a previous 3 mm calcification. There is increased prominence of the proximal pancreatic duct. Findings could indicate an enlarging proximal pancreatic ductal stone. No biliary dilatation is seen. Consider MRCP follow-up. 6. Osteopenia and degenerative change. Electronically Signed   By: Telford Nab M.D.   On: 07/03/2021 01:03   DG Chest Port 1 View  Result Date: 07/02/2021 CLINICAL DATA:  Diarrhea for 4 days EXAM: PORTABLE CHEST 1 VIEW COMPARISON:  05/15/2021 FINDINGS: Bibasilar opacities,  left-greater-than-right. Small left pleural effusion. Cardiomediastinal contours are normal. IMPRESSION: Bibasilar atelectasis and small left pleural effusion. Electronically Signed   By: Ulyses Jarred M.D.   On: 07/02/2021 23:18    03/11/21 echo  IMPRESSIONS:  1. Left ventricular ejection fraction, by estimation, is 45 to 50%. The  left ventricle has mildly decreased function. The left ventricle has no  regional wall motion abnormalities. There is mild left ventricular  hypertrophy. Left ventricular diastolic  parameters are consistent with Grade II diastolic dysfunction  (pseudonormalization).   2. Right ventricular systolic function is normal. The right ventricular  size is normal. There is severely elevated pulmonary artery systolic  pressure. The estimated right ventricular systolic pressure is 71.0 mmHg.   3. Left atrial size was severely dilated.   4. The mitral valve is normal in structure. Mild mitral valve  regurgitation. No evidence of mitral stenosis.   5. The aortic valve is calcified. Aortic valve regurgitation is not  visualized. Mild to moderate aortic valve sclerosis/calcification is  present, without any evidence of aortic stenosis.   6. The inferior vena cava is dilated in size with <50% respiratory  variability, suggesting right atrial pressure of 15 mmHg.   EKG: Independently reviewed.  Vent. rate 83 BPM PR interval 63 ms QRS duration 98 ms QT/QTcB 412/485 ms P-R-T axes -34 6 81 Sinus rhythm Short PR interval Nonspecific T abnormalities, lateral leads Borderline prolonged QT interval Confirmed by Quintella Reichert   Assessment/Plan Principal Problem:   Multifocal pneumonia (aspiration?   COPD Admit to PCU with/inpatient. Supplemental oxygen as needed.   Bronchodilators as needed. Continue Symbicort or formulary equivalent. Continue ceftriaxone 2 g IVPB daily. Continue metronidazole 500 mg IVPB every 12 hours. SLP did not find any swallowing  abnormalities.  Active Problems:   Essential hypertension Continue furosemide 20 mg p.o. twice daily. Monitor BP, renal function electrolytes.    BPH (benign prostatic hyperplasia) Continue finasteride.  Chronic kidney disease, stage IV (severe) ([place]) Renal function at baseline.    CAD, multiple vessel Continue aspirin and atorvastatin.    DM (diabetes mellitus), type 2 with renal complications (HCC) Carbohydrate modified diet. CBG monitoring with RI SS. Resume Lantus once oral intake improves.    Chronic heart failure with preserved ejection fraction (HFpEF) (HCC) No signs of decompensation.    Anemia in chronic kidney disease Monitor H&H. Transfuse as needed.    Persistent atrial fibrillation (HCC) Rate is controlled.    Prolonged QT interval Correct electrolytes. Avoid nonessential QT prolonging meds if possible.    Stage III pressure ulcer of sacral region [facility]) Continue local care and close monitoring.    Hypokalemia Replacing. Follow-up potassium level.    Hypoglycemia Resolved.    Mild protein malnutrition (HCC) Protein supplementation.    Stage II pressure ulcer sacral area POA Continue local care and preventive measures.   DVT prophylaxis: SCDs. Code Status:   DNR status confirmed with his daughter. Family Communication:  I spoke to his [name] on the phone. Disposition Plan:   Patient is from:  Home.  Anticipated DC to:  Home.  Anticipated DC date:  07/06/2021.  Anticipated DC barriers: Clinical status.  Consults called:   Admission status:  Inpatient/PCU.  Severity of Illness: High severity in the setting of multifocal pneumonia.  Reubin Milan MD [facility]  How to contact the [facility] Attending or Consulting provider [place] or covering provider during after hours [place], for this patient?   Check the care team in [facility] and look for a) attending/consulting [facility] provider listed and b) the [facility] team listed Log into  [email or web address] and use [place]'s universal password to access. If you do not have the password, please contact the hospital operator. Locate the [facility] provider you are looking for under [facility] and page to a number that you can be directly reached. If you still have difficulty reaching the provider, please page the [facility] (Director on Call) for the Hospitalists listed on amion for assistance.  07/03/2021, 7:53 AM   This document was prepared using Dragon voice recognition software and may contain some unintended transcription errors.

## 2021-07-03 NOTE — Plan of Care (Signed)
  Problem: Activity: Goal: Ability to tolerate increased activity will improve Outcome: Progressing   

## 2021-07-03 NOTE — TOC Initial Note (Signed)
Transition of Care [facility]) - Initial/Assessment Note    Patient Details  Name: Clayton [name] MRN: [id number] Date of Birth: 1927/12/09  Transition of Care [facility]) CM/SW Contact:    Leeroy Cha, RN Phone Number: 07/03/2021, 10:04 AM  Clinical Narrative:                 Request for snf placement will follow up with pt eval.  Expected Discharge Plan: Skilled Nursing Facility Barriers to Discharge: Continued Medical Work up   Patient Goals and CMS Choice Patient states their goals for this hospitalization and ongoing recovery are:: not stated CMS Medicare.gov Compare Post Acute Care list provided to:: Patient Choice offered to / list presented to : Patient  Expected Discharge Plan and Services Expected Discharge Plan: [place]   Discharge Planning Services: CM Consult Post Acute Care Choice: [place] Living arrangements for the past 2 months: Single Family Home                                      Prior Living Arrangements/Services Living arrangements for the past 2 months: Single Family Home Lives with:: Self Patient language and need for interpreter reviewed:: Yes Do you feel safe going back to the place where you live?: Yes            Criminal Activity/Legal Involvement Pertinent to Current Situation/Hospitalization: No - Comment as needed  Activities of Daily Living Home Assistive Devices/Equipment: Walker (specify type) ADL Screening (condition at time of admission) Patient's cognitive ability adequate to safely complete daily activities?: Yes Is the patient deaf or have difficulty hearing?: Yes Does the patient have difficulty seeing, even when wearing glasses/contacts?: Yes Does the patient have difficulty concentrating, remembering, or making decisions?: No Patient able to express need for assistance with ADLs?: Yes Does the patient have difficulty dressing or bathing?: Yes Independently performs ADLs?: Yes  (appropriate for developmental age) Does the patient have difficulty walking or climbing stairs?: Yes Weakness of Legs: Both Weakness of Arms/Hands: None  Permission Sought/Granted                  Emotional Assessment Appearance:: Appears stated age Attitude/Demeanor/Rapport: Engaged Affect (typically observed): Apprehensive Orientation: : Oriented to Place, Oriented to Self, Oriented to  Time, Oriented to Situation Alcohol / Substance Use: Not Applicable Psych Involvement: No (comment)  Admission diagnosis:  Diarrhea [R19.7] Multifocal pneumonia [J18.9] Diarrhea, unspecified type [R19.7] Aspiration pneumonia, unspecified aspiration pneumonia type, unspecified laterality, unspecified part of lung ([place]) [J69.0] Patient Active Problem List   Diagnosis Date Noted   Multifocal pneumonia 07/03/2021   Prolonged QT interval 07/03/2021   Persistent atrial fibrillation ([place]) 05/13/2021   Anemia in chronic kidney disease 03/15/2021   DM (diabetes mellitus), type 2 with renal complications ([place]) 67/59/1638   Chronic heart failure with preserved ejection fraction (HFpEF) ([place]) 03/11/2021   Symptomatic anemia 03/11/2021   Elevated troponin 03/11/2021   Weight loss, unintentional 09/12/2020   Leukocytosis 09/12/2020   Unsteady gait when walking 12/14/2019   Rhonchi at both lung bases    AV heart block    CAD, multiple vessel    Normocytic normochromic anemia    Chronic obstructive pulmonary disease (HCC)    ST-segment elevation myocardial infarction (STEMI) of inferior wall ([place]) 04/20/2018   Symptomatic bradycardia 04/20/2018   Presence of drug-eluting stent in right coronary artery 04/18/2018   Iron deficiency 10/30/2016  Cellulitis of right hand 08/11/2016   Chronic kidney disease, stage IV (severe) ([place]) 08/07/2016   Type II or unspecified type diabetes mellitus with renal manifestations, uncontrolled(250.42) 02/03/2014   BPH (benign prostatic hyperplasia) 07/03/2013    Hyperlipidemia associated with type 2 diabetes mellitus ([place]) 04/06/2013   Dyspnea 03/04/2013   Smoking history 07/24/2012   Cough variant asthma 07/24/2012   Solitary pulmonary nodule 06/27/2012   Hypoxia 06/19/2012   Diabetes mellitus ([place]) 06/19/2012   Hard of hearing 06/19/2012   Essential hypertension 05/19/2010   OSA treated with BiPAP 05/19/2010   PCP:  Lujean Amel, MD Pharmacy:   [facility], [place] - [address] AT [address] [place] [address] Phone: [phone] Fax: [phone]     Social Determinants of Health (SDOH) Interventions    Readmission Risk Interventions No flowsheet data found.

## 2021-07-03 NOTE — Progress Notes (Signed)
Initial Nutrition Assessment  DOCUMENTATION CODES:   Not applicable  INTERVENTION:  - will order Boost Breeze BID, each supplement provides 250 kcal and 9 grams of protein. - will order 1 tablet multivitamin with minerals/day. - complete NFPE when feasible.    NUTRITION DIAGNOSIS:   Inadequate oral intake related to acute illness, decreased appetite, diarrhea as evidenced by per patient/family report.  GOAL:   Patient will meet greater than or equal to 90% of their needs  MONITOR:   PO intake, Supplement acceptance, Labs, Weight trends  REASON FOR ASSESSMENT:   Malnutrition Screening Tool  ASSESSMENT:   88 year-old male with medical history  of DM, HTN, CKD, IDA, CAD s/p PCI, HLD, and COPD. He presented to the ED from SNF due to diarrhea x4 days with at least 6 episodes of watery diarrhea on the day of presentation to the ED. He reported decreased appetite; no abdominal pain or vomiting.  Unable to see patient at time of attempted visit earlier this afternoon.   Patient ate 100% of lunch today. (452 kcal and 19 grams protein).  He has not been seen by a [facility] RD at any time in the past.  Weight today is 192 lb and weight on 04/21/21 was 197 lb. This indicates 5 lb weight loss (2.5% body weight) in the past 2.5 months; not significant for time frame.   SLP note from earlier this afternoon reviewed.   Labs reviewed; CBGs: 92, 59, 87, 79 mg/dl, K: 3.2 mmol/l, BUN: 87 mg/dl, creatinine: 3.03 mg/dl, Ca: 8.2 mg/dl, GFR: 19 ml/min.  Medications reviewed; 2000 units cholecalciferol/day, 20 mg oral lasix BID, 5 mg melatonin/night, 17 g miralax/day, 10 mEq IV KCl x1 2 runs 1/16.  IVF; LR @ 75 ml/hr.    NUTRITION - FOCUSED PHYSICAL EXAM:  Unable to complete at this time.   Diet Order:   Diet Order             Diet heart healthy/carb modified Room service appropriate? Yes; Fluid consistency: Thin  Diet effective now                   EDUCATION NEEDS:   No  education needs have been identified at this time  Skin:  Skin Assessment: Skin Integrity Issues: Skin Integrity Issues:: Other (Comment) Other: non-pressure injury to R ankle; RN note from 1/16 at [phone] states patient with stage 3 pressure injuries to sacrum and coccyx  Last BM:  PTA/unknown  Height:   Ht Readings from Last 1 Encounters:  07/03/21 5\' 10"  (1.778 m)    Weight:   Wt Readings from Last 1 Encounters:  07/03/21 87 kg     Estimated Nutritional Needs:  Kcal:  1750-1950 kcal Protein:  90-105 grams Fluid:  >/= 2.2 L/day     Jarome Matin, MS, RD, LDN Inpatient Clinical Dietitian RD pager # available in [place]  After hours/weekend pager # available in [facility]

## 2021-07-03 NOTE — Progress Notes (Signed)
Admitted from ED to 1444. Alert /oriented * 4. Denies pain . Hard of hearing.  Hearing aid rt and Lt. Ear. Afibb on monitor. BP elevated w/ systolic 211/17. HR 83. 97% RA. 20 G RFA intact w/ LR @ 75 cc/hr. Generalized bruising noted arms/legs. Skin tears noted to left knee and states he does not know how he got them but that he did not fall. Stg 3 pressure injuries noted to sacral left/right sides and coccyx bony prominence area. Foam dressing applied. Call light in reach. Continuing to monitor.

## 2021-07-03 NOTE — Evaluation (Signed)
Clinical/Bedside Swallow Evaluation Patient Details  Name: Clayton [name] MRN: [id number] Date of Birth: 04-May-1928  Today's Date: 07/03/2021 Time: SLP Start Time (ACUTE ONLY): 65 SLP Stop Time (ACUTE ONLY): 1045 SLP Time Calculation (min) (ACUTE ONLY): 15 min  Past Medical History:  Past Medical History:  Diagnosis Date   Anemia    BPH (benign prostatic hyperplasia)    CAD (coronary artery disease)    hx of stemi  04-2018    Chronic renal failure    Chronic renal insufficiency    COPD (chronic obstructive pulmonary disease) (HCC)    DDD (degenerative disc disease), lumbar    Diabetes mellitus    Hyperlipidemia    Hyperparathyroidism (HCC)    Hypertension    IDA (iron deficiency anemia)    Neuromuscular disorder (HCC)    OSA on CPAP    Osteomyelitis of forearm (HCC)    Osteoporosis    Wenckebach second degree AV block 11/2020   Event monitor showed [place] Amber block intermittently as well as first-degree block.  Rare occasional PACs and PVCs.  Minimum heart rate 32 bpm at early morning hours.  Maximal heart rate 112 bpm, average 76 bpm.   Past Surgical History:  Past Surgical History:  Procedure Laterality Date   CORONARY STENT INTERVENTION N/A 04/20/2018   Procedure: CORONARY STENT INTERVENTION;  Surgeon: Leonie Man, MD;  Location: [facility];;;     CORONARY/GRAFT ACUTE MI REVASCULARIZATION N/A 04/20/2018   Procedure: Coronary/Graft Acute MI Revascularization;  Surgeon: Leonie Man, MD;  Location: [facility];  Service: Cardiovascular;  Laterality: N/A; --> after initial PTCA restoring flow down the RCA, there was diffuse proximal to mid disease treated with a DES SYNERGY 3 X 38 ( 3.56mm)   EYE SURGERY     HOLTER MONITOR  05/2018   Sinus rhythm noted with sinus bradycardia.  Also sinus rhythm with 2-1 AV block noted.  Maximum heart rate was sinus tachycardia 123 bpm.  Rare PVCs accelerated idioventricular rhythm noted.  Intermittent Wenckebach block  noted.   I & D EXTREMITY Right 08/13/2016   Procedure: IRRIGATION AND DEBRIDEMENT RIGHT ELBOW AND HAND;  Surgeon: Milly Jakob, MD;  Location: [facility];  Service: Orthopedics;  Laterality: Right;   LEFT HEART CATH AND CORONARY ANGIOGRAPHY N/A 04/20/2018   Procedure: LEFT HEART CATH AND CORONARY ANGIOGRAPHY;  Surgeon: Leonie Man, MD;  Location: [facility]; mRCA 100%&80% (after 55% tapering in pRCA), mLM-ostLAD 40% w/ 85% ostCx, ost-pCx 65% @ Om1 w/ ost 85%. pLAD 60% & 50% after D1 with distal 75%, ostD1 65% & 90% after small side branch.     TRANSTHORACIC ECHOCARDIOGRAM  04/21/2018   EF 50-55%. Posterior Wall HK. Severe RV HK, dilated IVC. Temp wire in place   VASECTOMY     VIDEO BRONCHOSCOPY Bilateral 08/25/2012   Procedure: VIDEO BRONCHOSCOPY WITHOUT FLUORO;  Surgeon: Brand Males, MD;  Location: [facility];  Service: Cardiopulmonary;  Laterality: Bilateral;   HPI:  Patient is a 88 y.o. male with PMH: IDDM, HTN, CKD, IDA, CAD s/p PCI, HLD, and COPD presents from SNF for diarrhea. Diarrhea has been constant x 4 days. He has had an associated decreased appetite but no vomitting. He has had productive cough of green sputum per daughter's report in ED. CT abdomen/pelvis revealed Worsening left-greater-than-right lower lobe consolidation, consider acute on chronic aspiration. Background bronchiectasis with scattered mucoid impaction; anterior lung bases are clear.    Assessment / Plan / Recommendation  Clinical  Impression  Patient is not currently presenting with any clinical s/s of dysphagia. No overt s/s aspiration or penetration observed with successive straw sips of thin liquids (water) or with puree solids, regular texture solids. Patient had a strong cough and although he did not demonstrate productive coughing during session, he and daughter report he has had a productive cough. Although patient's swallow appears to be [facility], he has been assessed by ST services in the past for  potential dysphagia, he had an MBS in 2017 and current CT abdomen/pelvis showing "suspected acute on chronic aspiration PNA". SLP is recommending brief f/u to ensure patient is tolerating PO's and if objective swallow study is warranted to r/o aspiration. SLP Visit Diagnosis: Dysphagia, unspecified (R13.10)    Aspiration Risk  No limitations;Mild aspiration risk    Diet Recommendation Regular;Thin liquid   Liquid Administration via: Straw;Cup Medication Administration: Whole meds with liquid Supervision: Patient able to self feed Compensations: Slow rate;Small sips/bites Postural Changes: Seated upright at 90 degrees    Other  Recommendations Oral Care Recommendations: Oral care BID    Recommendations for follow up therapy are one component of a multi-disciplinary discharge planning process, led by the attending physician.  Recommendations may be updated based on patient status, additional functional criteria and insurance authorization.  Follow up Recommendations No SLP follow up      Assistance Recommended at Discharge None  Functional Status Assessment Patient has had a recent decline in their functional status and demonstrates the ability to make significant improvements in function in a reasonable and predictable amount of time.  Frequency and Duration min 1 x/week  1 week       Prognosis Prognosis for Safe Diet Advancement: Good      Swallow Study   General Date of Onset: 07/03/21 HPI: Patient is a 88 y.o. male with PMH: IDDM, HTN, CKD, IDA, CAD s/p PCI, HLD, and COPD presents from SNF for diarrhea. Diarrhea has been constant x 4 days. He has had an associated decreased appetite but no vomitting. He has had productive cough of green sputum per daughter's report in ED. CT abdomen/pelvis revealed Worsening left-greater-than-right lower lobe consolidation, consider acute on chronic aspiration. Background bronchiectasis with scattered mucoid impaction; anterior lung bases are  clear. Type of Study: Bedside Swallow Evaluation Previous Swallow Assessment: BSE during previous admission in 2018 which also referenced an MBS completed at [facility] in 2017 and only finding penetration but no aspiration. Diet Prior to this Study: NPO Temperature Spikes Noted: No Respiratory Status: Room air History of Recent Intubation: No Behavior/Cognition: Pleasant mood;Alert;Cooperative Oral Cavity Assessment: Within Functional Limits Oral Care Completed by SLP: No Oral Cavity - Dentition: Dentures, bottom;Dentures, top Vision: Functional for self-feeding Self-Feeding Abilities: Able to feed self Patient Positioning: Upright in bed Baseline Vocal Quality: Normal Volitional Cough: Strong Volitional Swallow: Able to elicit    Oral/Motor/Sensory Function Overall Oral Motor/Sensory Function: Within functional limits   Ice Chips     Thin Liquid Thin Liquid: Within functional limits Presentation: Straw;Self Fed    Nectar Thick     Honey Thick     Puree Puree: Within functional limits Presentation: Self Fed;Spoon   Solid     Solid: Within functional limits Presentation: [place], MA, CCC-SLP Speech Therapy

## 2021-07-03 NOTE — Progress Notes (Addendum)
Hypoglycemic Event  CBG: 59 at 06:03 (1/16) during night shift.  After receiving morning report, RN advised NT to perform CBG check.  Re-check at 07:52 am was 87.  Will continue to monitor.   Layla Maw, RN

## 2021-07-04 LAB — CBC
HCT: 24.9 % — ABNORMAL LOW (ref 39.0–52.0)
Hemoglobin: 7.3 g/dL — ABNORMAL LOW (ref 13.0–17.0)
MCH: 25.7 pg — ABNORMAL LOW (ref 26.0–34.0)
MCHC: 29.3 g/dL — ABNORMAL LOW (ref 30.0–36.0)
MCV: 87.7 fL (ref 80.0–100.0)
Platelets: 333 10*3/uL (ref 150–400)
RBC: 2.84 MIL/uL — ABNORMAL LOW (ref 4.22–5.81)
RDW: 17.3 % — ABNORMAL HIGH (ref 11.5–15.5)
WBC: 10.1 10*3/uL (ref 4.0–10.5)
nRBC: 0 % (ref 0.0–0.2)

## 2021-07-04 LAB — COMPREHENSIVE METABOLIC PANEL
ALT: 15 U/L (ref 0–44)
AST: 20 U/L (ref 15–41)
Albumin: 2.4 g/dL — ABNORMAL LOW (ref 3.5–5.0)
Alkaline Phosphatase: 71 U/L (ref 38–126)
Anion gap: 9 (ref 5–15)
BUN: 84 mg/dL — ABNORMAL HIGH (ref 8–23)
CO2: 19 mmol/L — ABNORMAL LOW (ref 22–32)
Calcium: 7.6 mg/dL — ABNORMAL LOW (ref 8.9–10.3)
Chloride: 109 mmol/L (ref 98–111)
Creatinine, Ser: 2.77 mg/dL — ABNORMAL HIGH (ref 0.61–1.24)
GFR, Estimated: 21 mL/min — ABNORMAL LOW (ref >60)
Glucose, Bld: 159 mg/dL — ABNORMAL HIGH (ref 70–99)
Potassium: 3.4 mmol/L — ABNORMAL LOW (ref 3.5–5.1)
Sodium: 137 mmol/L (ref 135–145)
Total Bilirubin: 0.6 mg/dL (ref 0.3–1.2)
Total Protein: 5.6 g/dL — ABNORMAL LOW (ref 6.5–8.1)

## 2021-07-04 LAB — GLUCOSE, CAPILLARY
Glucose-Capillary: 159 mg/dL — ABNORMAL HIGH (ref 70–99)
Glucose-Capillary: 177 mg/dL — ABNORMAL HIGH (ref 70–99)
Glucose-Capillary: 168 mg/dL — ABNORMAL HIGH (ref 70–99)
Glucose-Capillary: 237 mg/dL — ABNORMAL HIGH (ref 70–99)

## 2021-07-04 MED ORDER — IPRATROPIUM-ALBUTEROL 0.5-2.5 (3) MG/3ML IN SOLN: 3.0000 mL | Freq: Four times a day (QID) | RESPIRATORY_TRACT | Status: DC

## 2021-07-04 MED ORDER — IPRATROPIUM-ALBUTEROL 0.5-2.5 (3) MG/3ML IN SOLN
3.0000 mL | Freq: Two times a day (BID) | RESPIRATORY_TRACT | Status: DC
  Administered 2021-07-04 – 2021-07-05 (×2): 3 mL via RESPIRATORY_TRACT
  Filled 2021-07-04 (×3): qty 3

## 2021-07-04 MED ORDER — GUAIFENESIN-DM 100-10 MG/5ML PO SYRP
5.0000 mL | ORAL_SOLUTION | ORAL | Status: DC | PRN
  Administered 2021-07-05 – 2021-07-09 (×6): 5 mL via ORAL
  Filled 2021-07-04 (×6): qty 10

## 2021-07-04 MED ORDER — POTASSIUM CHLORIDE CRYS ER 20 MEQ PO TBCR
40.0000 meq | EXTENDED_RELEASE_TABLET | Freq: Once | ORAL | Status: AC
  Administered 2021-07-04: 40 meq via ORAL
  Filled 2021-07-04: qty 2

## 2021-07-04 MED ORDER — POTASSIUM CHLORIDE CRYS ER 20 MEQ PO TBCR
40.0000 meq | EXTENDED_RELEASE_TABLET | Freq: Two times a day (BID) | ORAL | Status: DC
  Administered 2021-07-04: 40 meq via ORAL
  Filled 2021-07-04 (×2): qty 2

## 2021-07-04 MED ORDER — POLYETHYLENE GLYCOL 3350 17 G PO PACK
17.0000 g | PACK | Freq: Every day | ORAL | Status: DC | PRN
  Administered 2021-07-07 – 2021-07-08 (×2): 17 g via ORAL
  Filled 2021-07-04 (×2): qty 1

## 2021-07-04 NOTE — Progress Notes (Addendum)
PROGRESS NOTE  JAKADEN OUZTS AVW:[id number] DOB: [birth year] DOA: 07/02/2021 PCP: Lujean Amel, MD  HPI/Recap of past 24 hours:  Clayton [name] is a 88 y.o. male with medical history significant of BPH, CAD, COPD, lumbar DDD, type II DM, hyperlipidemia, hyperparathyroidism, hypertension, iron deficiency anemia, OSA not on CPAP, osteomyelitis, osteoporosis, chronic systolic CHF, second-degree AV block who is coming to the emergency department from his residential facility due to diarrhea for the past 4 days.  He also has been coughing greenish sputum and Felt short of breath.  Work up revealed multifocal pneumonia with concern for aspiration, therefore speech therapist was consulted.  07/04/21: Seen and examined at his bedside.  Has no complaints.  Denies abdominal pain, or chest pain.  Ongoing management of possible aspiration pna.    Assessment/Plan: Principal Problem:   Multifocal pneumonia Active Problems:   Essential hypertension   BPH (benign prostatic hyperplasia)   Chronic kidney disease, stage IV (severe) (HCC)   CAD, multiple vessel   DM (diabetes mellitus), type 2 with renal complications (HCC)   Chronic heart failure with preserved ejection fraction (HFpEF) (HCC)   Anemia in chronic kidney disease   Persistent atrial fibrillation (HCC)   Prolonged QT interval   Stage III pressure ulcer of sacral region ([place])   Hypokalemia   Hypoglycemia   Mild protein malnutrition (HCC)  Multifocal pneumonia with concern for possible aspiration pneumonia, POA Started on Rocephin and IV Flagyl on admission, continue. Continue bronchodilators, pulmonary toilet. Maintain O2 saturation greater 92%. Switch to Eliquis evaluation  Possible dysphagia Aspiration precautions Speech therapist evaluation  Improved AKI on CKD 4 Baseline creatinine placed to be 2.4 Presented with creatinine of 3.03 Closely monitor urine output Creatinine is downtrending 2.7, received IV fluid,  discontinued on 07/04/2021 due to chronic systolic CHF. Avoid nephrotoxic agents and hypotension. Repeat BMP in the morning  Chronic systolic CHF Last 2D echo LVEF 45 to 50% Start strict I's and O's and daily weight Closely monitor volume status Currently euvolemic  Loose stools, and cardiology GI panel by PCR pending.    Essential hypertension Continue furosemide 20 mg p.o. twice daily. Imdur Monitor BP, renal function electrolytes.     BPH (benign prostatic hyperplasia) Continue finasteride.     Chronic kidney disease, stage IV (severe) ([place]) Renal function at baseline.     CAD, multiple vessel Continue aspirin and atorvastatin.     DM (diabetes mellitus), type 2 with renal complications (HCC) Carbohydrate modified diet. CBG monitoring with RI SS. Continue insulin scale     Anemia in chronic kidney disease Monitor H&H. Transfuse as needed.     Persistent atrial fibrillation (HCC) Rate is controlled.     Prolonged QT interval Correct electrolytes. Avoid nonessential QT prolonging meds if possible. Repeat 12 EKG Optimize magnesium and potassium levels     Stage III pressure ulcer of sacral region [facility]) Continue local care and close monitoring.   Refractory hypokalemia Repleted orally Repeat BMP in the morning    Mild protein malnutrition (HCC) Protein supplementation.     Stage II pressure ulcer sacral area POA Continue local care and preventive measures.  Physical debility PT OT to assess Fall precautions.     DVT prophylaxis:      SCDs. Code Status:              DNR status confirmed with his daughter. Family Communication:      None at bedside.    Disposition Plan:  Patient is from:                        Home.             Anticipated DC to:                   Home.             Anticipated DC date:               07/06/2021.             Anticipated DC barriers:         Clinical status.           Consults called:         Admission  status:     Inpatient    Status is: Inpatient  Patient requires at least 2 midnights for further evaluation and treatment of present condition.      Objective: Vitals:   07/03/21 2105 07/04/21 0543 07/04/21 0757 07/04/21 1142  BP: (!) 140/50 119/72  135/60  Pulse: 78 (!) 51  (!) 52  Resp: 20 20  20   Temp: 98.8 F (37.1 C) 98.1 F (36.7 C)  98.2 F (36.8 C)  TempSrc: Oral Oral  Oral  SpO2: 96% 96% 97% 96%  Weight:      Height:        Intake/Output Summary (Last 24 hours) at 07/04/2021 1201 Last data filed at 07/04/2021 0600 Gross per 24 hour  Intake 2123.99 ml  Output 725 ml  Net 1398.99 ml   Filed Weights   07/03/21 1111  Weight: 87 kg    Exam:  General: 88 y.o. year-old male well developed well nourished in no acute distress.  Alert and interactive. Cardiovascular: Regular rate and rhythm with no rubs or gallops.  No thyromegaly or JVD noted.   Respiratory: Mild rales at bases.  No wheezing noted.  Poor inspiratory effort. Abdomen: Soft nontender nondistended with normal bowel sounds x4 quadrants. Musculoskeletal: No lower extremity edema. 2/4 pulses in all 4 extremities. Skin: No ulcerative lesions noted or rashes, Psychiatry: Mood is appropriate for condition and setting   Data Reviewed: CBC: Recent Labs  Lab 07/02/21 2246 07/04/21 0346  WBC 17.1* 10.1  NEUTROABS 13.8*  --   HGB 8.7* 7.3*  HCT 28.9* 24.9*  MCV 87.0 87.7  PLT 420* 440   Basic Metabolic Panel: Recent Labs  Lab 07/02/21 2246 07/04/21 0346  NA 138 137  K 3.2* 3.4*  CL 107 109  CO2 19* 19*  GLUCOSE 81 159*  BUN 87* 84*  CREATININE 3.03* 2.77*  CALCIUM 8.2* 7.6*   GFR: Estimated Creatinine Clearance: 17.9 mL/min (A) (by C-G formula based on SCr of 2.77 mg/dL (H)). Liver Function Tests: Recent Labs  Lab 07/02/21 2246 07/04/21 0346  AST 29 20  ALT 18 15  ALKPHOS 85 71  BILITOT 0.4 0.6  PROT 7.1 5.6*  ALBUMIN 3.1* 2.4*   Recent Labs  Lab 07/02/21 2246  LIPASE 29    No results for input(s): AMMONIA in the last 168 hours. Coagulation Profile: No results for input(s): INR, PROTIME in the last 168 hours. Cardiac Enzymes: No results for input(s): CKTOTAL, CKMB, CKMBINDEX, TROPONINI in the last 168 hours. BNP (last 3 results) No results for input(s): PROBNP in the last 8760 hours. HbA1C: No results for input(s): HGBA1C in the last 72 hours. CBG: Recent Labs  Lab 07/03/21 1141 07/03/21 1637  07/03/21 2101 07/04/21 0738 07/04/21 1135  GLUCAP 79 133* 166* 159* 177*   Lipid Profile: No results for input(s): CHOL, HDL, LDLCALC, TRIG, CHOLHDL, LDLDIRECT in the last 72 hours. Thyroid Function Tests: No results for input(s): TSH, T4TOTAL, FREET4, T3FREE, THYROIDAB in the last 72 hours. Anemia Panel: No results for input(s): VITAMINB12, FOLATE, FERRITIN, TIBC, IRON, RETICCTPCT in the last 72 hours. Urine analysis:    Component Value Date/Time   COLORURINE YELLOW 07/03/2021 0118   APPEARANCEUR CLEAR 07/03/2021 0118   LABSPEC 1.011 07/03/2021 0118   PHURINE 5.0 07/03/2021 0118   GLUCOSEU NEGATIVE 07/03/2021 0118   GLUCOSEU NEGATIVE 07/19/2015 0829   HGBUR SMALL (A) 07/03/2021 0118   BILIRUBINUR NEGATIVE 07/03/2021 0118   KETONESUR NEGATIVE 07/03/2021 0118   PROTEINUR 100 (A) 07/03/2021 0118   UROBILINOGEN 0.2 07/19/2015 0829   NITRITE NEGATIVE 07/03/2021 0118   LEUKOCYTESUR NEGATIVE 07/03/2021 0118   Sepsis Labs: @LABRCNTIP (procalcitonin:4,lacticidven:4)  ) Recent Results (from the past 240 hour(s))  Resp Panel by RT-PCR (Flu A&B, Covid) Nasopharyngeal Swab     Status: None   Collection Time: 07/02/21 10:40 PM   Specimen: Nasopharyngeal Swab; Nasopharyngeal(NP) swabs in vial transport medium  Result Value Ref Range Status   SARS Coronavirus 2 by RT PCR NEGATIVE NEGATIVE Final    Comment: (NOTE) SARS-CoV-2 target nucleic acids are NOT DETECTED.  The SARS-CoV-2 RNA is generally detectable in upper respiratory specimens during the acute  phase of infection. The lowest concentration of SARS-CoV-2 viral copies this assay can detect is 138 copies/mL. A negative result does not preclude SARS-Cov-2 infection and should not be used as the sole basis for treatment or other patient management decisions. A negative result may occur with  improper specimen collection/handling, submission of specimen other than nasopharyngeal swab, presence of viral mutation(s) within the areas targeted by this assay, and inadequate number of viral copies(<138 copies/mL). A negative result must be combined with clinical observations, patient history, and epidemiological information. The expected result is Negative.  Fact Sheet for Patients:  EntrepreneurPulse.com.au  Fact Sheet for Healthcare Providers:  IncredibleEmployment.be  This test is no t yet approved or cleared by the [place] FDA and  has been authorized for detection and/or diagnosis of SARS-CoV-2 by FDA under an Emergency Use Authorization (EUA). This EUA will remain  in effect (meaning this test can be used) for the duration of the COVID-19 declaration under Section 564(b)(1) of the Act, 21 U.S.C.section 360bbb-3(b)(1), unless the authorization is terminated  or revoked sooner.       Influenza A by PCR NEGATIVE NEGATIVE Final   Influenza B by PCR NEGATIVE NEGATIVE Final    Comment: (NOTE) The Xpert Xpress SARS-CoV-2/FLU/RSV plus assay is intended as an aid in the diagnosis of influenza from Nasopharyngeal swab specimens and should not be used as a sole basis for treatment. Nasal washings and aspirates are unacceptable for Xpert Xpress SARS-CoV-2/FLU/RSV testing.  Fact Sheet for Patients: EntrepreneurPulse.com.au  Fact Sheet for Healthcare Providers: IncredibleEmployment.be  This test is not yet approved or cleared by the [place] FDA and has been authorized for detection and/or diagnosis of  SARS-CoV-2 by FDA under an Emergency Use Authorization (EUA). This EUA will remain in effect (meaning this test can be used) for the duration of the COVID-19 declaration under Section 564(b)(1) of the Act, 21 U.S.C. section 360bbb-3(b)(1), unless the authorization is terminated or revoked.  Performed at [facility], [place] [address] [place], [place] [address]   Expectorated Sputum Assessment w Gram  Stain, Rflx to Resp Cult     Status: None   Collection Time: 07/03/21 12:25 PM   Specimen: Sputum  Result Value Ref Range Status   Specimen Description SPUTUM  Final   Special Requests NONE  Final   Sputum evaluation   Final    THIS SPECIMEN IS ACCEPTABLE FOR SPUTUM CULTURE Performed at [facility], [place] [address], [place], [place] [address]    Report Status 07/03/2021 FINAL  Final  Culture, Respiratory w Gram Stain     Status: None (Preliminary result)   Collection Time: 07/03/21 12:25 PM   Specimen: SPU  Result Value Ref Range Status   Specimen Description   Final    SPUTUM Performed at [place] [address] [place], [place] [address]    Special Requests   Final    NONE Reflexed from [id number] Performed at [facility], [place] [address], [place], [place] [address]    Gram Stain   Final    NO SQUAMOUS EPITHELIAL CELLS SEEN FEW WBC SEEN FEW GRAM POSITIVE COCCI    Culture   Final    RARE PSEUDOMONAS AERUGINOSA SUSCEPTIBILITIES TO FOLLOW Performed at [facility], [place] [address], [place], [place] [address]    Report Status PENDING  Incomplete      Studies: No results found.  Scheduled Meds:  aspirin EC  81 mg Oral Daily   [START ON 07/05/2021] atorvastatin  40 mg Oral Once per day on Mon Wed Fri Sat   cholecalciferol  2,000 Units Oral Daily   feeding supplement  1 Container Oral BID BM   finasteride  5 mg Oral Daily   furosemide  20 mg Oral BID   isosorbide mononitrate  30 mg Oral  Daily   melatonin  5 mg Oral QHS   mometasone-formoterol  2 puff Inhalation BID   montelukast  10 mg Oral Daily   multivitamin with minerals  1 tablet Oral Daily   polyethylene glycol  17 g Oral QHS   potassium chloride  40 mEq Oral BID    Continuous Infusions:  cefTRIAXone (ROCEPHIN)  IV 2 g (07/04/21 0442)   lactated ringers 75 mL/hr at 07/04/21 0233   metronidazole 500 mg (07/04/21 0336)     LOS: 1 day     Kayleen Memos, MD [facility] Pager [phone]  If 7PM-7AM, please contact night-coverage [email or web address] Password [email or web address] 07/04/2021, 12:01 PM

## 2021-07-04 NOTE — Plan of Care (Signed)
  Problem: Activity: Goal: Ability to tolerate increased activity will improve Outcome: Progressing   Problem: Clinical Measurements: Goal: Ability to maintain a body temperature in the normal range will improve Outcome: Progressing   Problem: Respiratory: Goal: Ability to maintain adequate ventilation will improve Outcome: Progressing   

## 2021-07-05 LAB — CBC WITH DIFFERENTIAL/PLATELET
Abs Immature Granulocytes: 0.05 10*3/uL (ref 0.00–0.07)
Basophils Absolute: 0 10*3/uL (ref 0.0–0.1)
Basophils Relative: 0 %
Eosinophils Absolute: 0.8 10*3/uL — ABNORMAL HIGH (ref 0.0–0.5)
Eosinophils Relative: 7 %
HCT: 25.6 % — ABNORMAL LOW (ref 39.0–52.0)
Hemoglobin: 7.7 g/dL — ABNORMAL LOW (ref 13.0–17.0)
Immature Granulocytes: 0 %
Lymphocytes Relative: 8 %
Lymphs Abs: 0.8 10*3/uL (ref 0.7–4.0)
MCH: 26.2 pg (ref 26.0–34.0)
MCHC: 30.1 g/dL (ref 30.0–36.0)
MCV: 87.1 fL (ref 80.0–100.0)
Monocytes Absolute: 1.5 10*3/uL — ABNORMAL HIGH (ref 0.1–1.0)
Monocytes Relative: 14 %
Neutro Abs: 8 10*3/uL — ABNORMAL HIGH (ref 1.7–7.7)
Neutrophils Relative %: 71 %
Platelets: 351 10*3/uL (ref 150–400)
RBC: 2.94 MIL/uL — ABNORMAL LOW (ref 4.22–5.81)
RDW: 17 % — ABNORMAL HIGH (ref 11.5–15.5)
WBC: 11.2 10*3/uL — ABNORMAL HIGH (ref 4.0–10.5)
nRBC: 0 % (ref 0.0–0.2)

## 2021-07-05 LAB — COMPREHENSIVE METABOLIC PANEL
ALT: 14 U/L (ref 0–44)
AST: 16 U/L (ref 15–41)
Albumin: 2.5 g/dL — ABNORMAL LOW (ref 3.5–5.0)
Alkaline Phosphatase: 73 U/L (ref 38–126)
Anion gap: 7 (ref 5–15)
BUN: 80 mg/dL — ABNORMAL HIGH (ref 8–23)
CO2: 19 mmol/L — ABNORMAL LOW (ref 22–32)
Calcium: 8 mg/dL — ABNORMAL LOW (ref 8.9–10.3)
Chloride: 111 mmol/L (ref 98–111)
Creatinine, Ser: 2.51 mg/dL — ABNORMAL HIGH (ref 0.61–1.24)
GFR, Estimated: 24 mL/min — ABNORMAL LOW (ref >60)
Glucose, Bld: 224 mg/dL — ABNORMAL HIGH (ref 70–99)
Potassium: 4.5 mmol/L (ref 3.5–5.1)
Sodium: 137 mmol/L (ref 135–145)
Total Bilirubin: 0.5 mg/dL (ref 0.3–1.2)
Total Protein: 5.8 g/dL — ABNORMAL LOW (ref 6.5–8.1)

## 2021-07-05 LAB — MAGNESIUM: Magnesium: 1.9 mg/dL (ref 1.7–2.4)

## 2021-07-05 LAB — CULTURE, RESPIRATORY W GRAM STAIN: Gram Stain: NONE SEEN

## 2021-07-05 LAB — GLUCOSE, CAPILLARY
Glucose-Capillary: 163 mg/dL — ABNORMAL HIGH (ref 70–99)
Glucose-Capillary: 291 mg/dL — ABNORMAL HIGH (ref 70–99)
Glucose-Capillary: 283 mg/dL — ABNORMAL HIGH (ref 70–99)
Glucose-Capillary: 295 mg/dL — ABNORMAL HIGH (ref 70–99)

## 2021-07-05 LAB — PROCALCITONIN: Procalcitonin: 0.16 ng/mL

## 2021-07-05 LAB — PHOSPHORUS: Phosphorus: 4.6 mg/dL (ref 2.5–4.6)

## 2021-07-05 MED ORDER — SODIUM CHLORIDE 0.9 % IV SOLN
2.0000 g | INTRAVENOUS | Status: DC
  Administered 2021-07-05 – 2021-07-06 (×2): 2 g via INTRAVENOUS
  Filled 2021-07-05 (×2): qty 2

## 2021-07-05 MED ORDER — SODIUM CHLORIDE 0.9 % IV SOLN: INTRAVENOUS | Status: DC | PRN

## 2021-07-05 MED ORDER — INSULIN ASPART 100 UNIT/ML IJ SOLN
0.0000 [IU] | Freq: Every day | INTRAMUSCULAR | Status: DC
  Administered 2021-07-05: 3 [IU] via SUBCUTANEOUS
  Administered 2021-07-06: 5 [IU] via SUBCUTANEOUS
  Administered 2021-07-07 – 2021-07-08 (×2): 2 [IU] via SUBCUTANEOUS

## 2021-07-05 MED ORDER — INSULIN ASPART 100 UNIT/ML IJ SOLN
0.0000 [IU] | Freq: Three times a day (TID) | INTRAMUSCULAR | Status: DC
  Administered 2021-07-05 – 2021-07-06 (×3): 5 [IU] via SUBCUTANEOUS
  Administered 2021-07-06: 9 [IU] via SUBCUTANEOUS
  Administered 2021-07-07: 7 [IU] via SUBCUTANEOUS
  Administered 2021-07-07: 9 [IU] via SUBCUTANEOUS

## 2021-07-05 MED ORDER — IPRATROPIUM-ALBUTEROL 0.5-2.5 (3) MG/3ML IN SOLN
3.0000 mL | Freq: Three times a day (TID) | RESPIRATORY_TRACT | Status: DC
  Administered 2021-07-06 – 2021-07-08 (×7): 3 mL via RESPIRATORY_TRACT
  Filled 2021-07-05 (×7): qty 3

## 2021-07-05 NOTE — Progress Notes (Signed)
Inpatient Diabetes Program Recommendations  AACE/ADA: New Consensus Statement on Inpatient Glycemic Control (2015)  Target Ranges:  Prepandial:   less than 140 mg/dL      Peak postprandial:   less than 180 mg/dL (1-2 hours)      Critically ill patients:  140 - 180 mg/dL   Lab Results  Component Value Date   GLUCAP 291 (H) 07/05/2021   HGBA1C 7.9 (A) 06/27/2021    Review of Glycemic Control  Diabetes history: DM2 Outpatient Diabetes medications: Lantus 22 QD, Novolog 8-8-9 TID with meals Current orders for Inpatient glycemic control: None  CBGs 163, 291 mg/dL  Inpatient Diabetes Program Recommendations:    Add Novolog 0-9 units TID with meals and 0-5 HS  Will follow glucose trends daily.  Thank you. Lorenda Peck, RD, LDN, CDE Inpatient Diabetes Coordinator [phone]

## 2021-07-05 NOTE — Evaluation (Signed)
Physical Therapy Evaluation Patient Details Name: Clayton [name] MRN: [id number] DOB: Jan 25, 1928 Today's Date: 07/05/2021  History of Present Illness  Patient is a 88 year old male diarrhea for the past 4 days. He also has been coughing greenish sputum and felt short of breath. Work up revealed multifocal pneumonia with concern for aspiration. PMH includes BPH, CAD, COPD, lumbar DDD, type II DM, hyperlipidemia, hyperparathyroidism, hypertension, iron deficiency anemia, OSA not on CPAP, osteomyelitis, osteoporosis, chronic systolic CHF, second-degree AV block.   Clinical Impression  Clayton [name] is 88 y.o. male admitted with above HPI and diagnosis. Patient is currently limited by functional impairments below (see PT problem list). Patient is resident at [facility] in [place] and PTA was ambulating with min assist from family or staff with Rollator and min assist for transfers. Currently pt required Mo-Max assist for bed mobility and sit<>stand and was unable to initiate side steps at EOB secondary to pain, weakness, and imbalance. Patient will benefit from continued skilled PT interventions to address impairments and progress independence with mobility, recommending SNF for ST rehab prior to return to ALF. Acute PT will follow and progress as able.        Recommendations for follow up therapy are one component of a multi-disciplinary discharge planning process, led by the attending physician.  Recommendations may be updated based on patient status, additional functional criteria and insurance authorization.  Follow Up Recommendations Skilled nursing-short term rehab (<3 hours/day)    Assistance Recommended at Discharge Frequent or constant Supervision/Assistance  Patient can return home with the following       Equipment Recommendations None recommended by PT  Recommendations for Other Services       Functional Status Assessment Patient has had a recent decline in their functional  status and demonstrates the ability to make significant improvements in function in a reasonable and predictable amount of time.     Precautions / Restrictions Precautions Precautions: Fall Restrictions Weight Bearing Restrictions: No      Mobility  Bed Mobility Overal bed mobility: Needs Assistance Bed Mobility: Supine to Sit, Sit to Supine     Supine to sit: Max assist, +2 for physical assistance, +2 for safety/equipment, HOB elevated Sit to supine: Max assist, +2 for physical assistance, +2 for safety/equipment, HOB elevated   General bed mobility comments: Patient minimally able to assist due to pain, rigidity    Transfers Overall transfer level: Needs assistance Equipment used: 2 person hand held assist Transfers: Sit to/from Stand Sit to Stand: Mod assist, From elevated surface, +2 physical assistance, +2 safety/equipment           General transfer comment: Mod assist +2 for power up from EOB to rise. pt unable on first attempt due to resistance to stand. Pt improved initiation on second attempt. pt completed lateral scoots at EOB with MAX assist using bed pad as pt unable to clear buttocks above bed enough without assist.    Ambulation/Gait                  Stairs            Wheelchair Mobility    Modified Rankin (Stroke Patients Only)       Balance Overall balance assessment: Needs assistance, History of Falls Sitting-balance support: Feet supported Sitting balance-Leahy Scale: Fair     Standing balance support: Bilateral upper extremity supported Standing balance-Leahy Scale: Poor  Pertinent Vitals/Pain Pain Assessment Pain Assessment: Faces Faces Pain Scale: Hurts even more Pain Location: B LEs Pain Descriptors / Indicators: Moaning, Grimacing, Guarding Pain Intervention(s): Limited activity within patient's tolerance, Monitored during session, Repositioned    Home Living Family/patient  expects to be discharged to:: Assisted living                 Home Equipment: Rollator (4 wheels);Shower seat;BSC/3in1;Wheelchair - manual (lift chair, pt sleeps in recliner)      Prior Function Prior Level of Function : Needs assist       Physical Assist : Mobility (physical) Mobility (physical): Transfers;Gait   Mobility Comments: Per pt's daughter he uses a tall platform walker at ALF for gait. He has help from staff to transfer bed>chair but if they are not present sometimes he attempts to get up without assist. Pt's daughter repots she goes there every day to assist him with walking in the hallway. Reports he has a wheelchair as well and the staff takes him down to the dining hall in the wheelchair. Over the last week they have begun to use shorter rollator for transfers in room as it is easier to fit in bathroom.       Hand Dominance   Dominant Hand: Right    Extremity/Trunk Assessment   Upper Extremity Assessment Upper Extremity Assessment: Defer to OT evaluation;Generalized weakness    Lower Extremity Assessment Lower Extremity Assessment: Generalized weakness       Communication   Communication: HOH  Cognition Arousal/Alertness: Awake/alert Behavior During Therapy: WFL for tasks assessed/performed Overall Cognitive Status: History of cognitive impairments - at baseline                                 General Comments: providing inconsistent information regarding PLOF        General Comments      Exercises     Assessment/Plan    PT Assessment Patient needs continued PT services  PT Problem List Decreased strength;Decreased activity tolerance;Decreased balance;Decreased mobility;Decreased coordination;Decreased cognition;Decreased knowledge of use of DME;Decreased safety awareness;Decreased knowledge of precautions;Obesity;Pain       PT Treatment Interventions DME instruction;Gait training;Stair training;Functional mobility  training;Balance training;Therapeutic exercise;Therapeutic activities;Neuromuscular re-education;Cognitive remediation;Patient/family education;Wheelchair mobility training    PT Goals (Current goals can be found in the Care Plan section)  Acute Rehab PT Goals Patient Stated Goal: get walking again PT Goal Formulation: With family Time For Goal Achievement: 07/19/21 Potential to Achieve Goals: Fair    Frequency Min 2X/week     Co-evaluation PT/OT/SLP Co-Evaluation/Treatment: Yes Reason for Co-Treatment: For patient/therapist safety;To address functional/ADL transfers PT goals addressed during session: Mobility/safety with mobility;Balance OT goals addressed during session: ADL's and self-care;Strengthening/ROM       AM-PAC PT "6 Clicks" Mobility  Outcome Measure Help needed turning from your back to your side while in a flat bed without using bedrails?: Total Help needed moving from lying on your back to sitting on the side of a flat bed without using bedrails?: Total Help needed moving to and from a bed to a chair (including a wheelchair)?: Total Help needed standing up from a chair using your arms (e.g., wheelchair or bedside chair)?: Total Help needed to walk in hospital room?: Total Help needed climbing 3-5 steps with a railing? : Total 6 Click Score: 6    End of Session Equipment Utilized During Treatment: Gait belt Activity Tolerance: Patient limited by pain Patient left:  in bed;with call bell/phone within reach;with bed alarm set Nurse Communication: Mobility status PT Visit Diagnosis: Muscle weakness (generalized) (M62.81);Other abnormalities of gait and mobility (R26.89);Difficulty in walking, not elsewhere classified (R26.2)    Time: 3383-2919 PT Time Calculation (min) (ACUTE ONLY): 20 min   Charges:   PT Evaluation $PT Eval Moderate Complexity: 1 Mod          Verner Mould, DPT Acute Rehabilitation Services Office [phone] Pager [phone]    Jacques Navy 07/05/2021, 2:15 PM

## 2021-07-05 NOTE — Progress Notes (Addendum)
End of shift  Pt's IV came out during a turn.  IV team placed a new one.  Pt complains of pain in toe on rt foot.  Pt has stage 2 pressure ulcers on buttocks.  Pt has productive cough and respiratory contacted for neb treatment.  PT worked with pt today.  Pt unable to ambulate.  Anticipate pt discharging to a SNF.

## 2021-07-05 NOTE — Evaluation (Signed)
Occupational Therapy Evaluation Patient Details Name: Clayton [name] MRN: [id number] DOB: 1927/12/15 Today's Date: 07/05/2021   History of Present Illness Patient is a 88 year old male diarrhea for the past 4 days. He also has been coughing greenish sputum and felt short of breath. Work up revealed multifocal pneumonia with concern for aspiration. PMH includes BPH, CAD, COPD, lumbar DDD, type II DM, hyperlipidemia, hyperparathyroidism, hypertension, iron deficiency anemia, OSA not on CPAP, osteomyelitis, osteoporosis, chronic systolic CHF, second-degree AV block   Clinical Impression   Patient resides at SNF, unable to obtain reliable PLOF information from patient regarding how much assistance he received for self care tasks. PT able to talk with DTR and states he was ambulating with rollator prior to hospitalization. Currently patient needing max A x2 for bed mobility and with sit to stand at edge of bed. Patient unable to correct kyphotic posture in standing or weight shift to attempt side stepping at head of bed. Patient reporting pain in feet/lower extremities. Acute OT to follow.       Recommendations for follow up therapy are one component of a multi-disciplinary discharge planning process, led by the attending physician.  Recommendations may be updated based on patient status, additional functional criteria and insurance authorization.   Follow Up Recommendations  Skilled nursing-short term rehab (<3 hours/day)    Assistance Recommended at Discharge Frequent or constant Supervision/Assistance  Patient can return home with the following Two people to help with walking and/or transfers;A lot of help with bathing/dressing/bathroom    Functional Status Assessment  Patient has had a recent decline in their functional status and/or demonstrates limited ability to make significant improvements in function in a reasonable and predictable amount of time  Equipment Recommendations  None  recommended by OT       Precautions / Restrictions Precautions Precautions: Fall Restrictions Weight Bearing Restrictions: No      Mobility Bed Mobility               General bed mobility comments: Patient minimally able to assist due to pain, rigidity    Transfers                   General transfer comment: Use of bed pad to lift buttock off edge of bed. Kyphotic posture and unable to weight shift to attempt side stepping.      Balance Overall balance assessment: Needs assistance, History of Falls Sitting-balance support: Feet supported Sitting balance-Leahy Scale: Fair     Standing balance support: Bilateral upper extremity supported Standing balance-Leahy Scale: Poor                             ADL either performed or assessed with clinical judgement   ADL Overall ADL's : Needs assistance/impaired Eating/Feeding: Independent;Sitting Eating/Feeding Details (indicate cue type and reason): drink from boost Grooming: Set up;Sitting;Bed level   Upper Body Bathing: Moderate assistance;Sitting;Bed level   Lower Body Bathing: Total assistance;Sitting/lateral leans;Bed level   Upper Body Dressing : Moderate assistance;Sitting;Bed level   Lower Body Dressing: Total assistance;Sitting/lateral leans;Bed level   Toilet Transfer: Maximal assistance;+2 for physical assistance;+2 for safety/equipment Toilet Transfer Details (indicate cue type and reason): Patient needing heavy max A x2 to stand from edge of bed with difficulty correcting posture, kyphotic. Unable to weight shift to try and side step to [facility]. Toileting- Clothing Manipulation and Hygiene: Total assistance       Functional mobility during ADLs: Maximal assistance;+2  for physical assistance;+2 for safety/equipment General ADL Comments: Unsure of how much family/staff assists with self care however per DTR report patient could ambulate with rollator prior to this hospitalization;       Pertinent Vitals/Pain Pain Assessment Pain Assessment: Faces Faces Pain Scale: Hurts even more Pain Location: B LEs Pain Descriptors / Indicators: Moaning, Grimacing, Guarding Pain Intervention(s): Monitored during session     Hand Dominance Right   Extremity/Trunk Assessment Upper Extremity Assessment Upper Extremity Assessment: Generalized weakness   Lower Extremity Assessment Lower Extremity Assessment: Defer to PT evaluation       Communication Communication Communication: [facility]   Cognition                                       General Comments: providing inconsistent information regarding PLOF                Home Living Family/patient expects to be discharged to:: Assisted living                             Home Equipment: Rollator (4 wheels);Shower seat;BSC/3in1;Wheelchair - manual (lift chair,)          Prior Functioning/Environment Prior Level of Function : Needs assist             Mobility Comments: tall platform walker, [facility] since Nov 15th, has fallen a few times, has a lift chair to get up to chair, switched to rollator in room and transitions to wc and take hime to dining hall 3x/day. every night dtr walks him. family does a lot of care - bathing 1x/week. fell oct 5th and then transitioned to ALF. Lt leg stopped working. went to [facility] and then family walked him down the hall with wheelchair.          OT Problem List: Decreased strength;Decreased activity tolerance;Impaired balance (sitting and/or standing);Decreased safety awareness;Pain      OT Treatment/Interventions: Self-care/ADL training;Balance training;Patient/family education;Therapeutic activities    OT Goals(Current goals can be found in the care plan section) Acute Rehab OT Goals Patient Stated Goal: "that hurts" OT Goal Formulation: With patient Time For Goal Achievement: 07/19/21 Potential to Achieve Goals: Fair  OT Frequency:  Min 2X/week    Co-evaluation PT/OT/SLP Co-Evaluation/Treatment: Yes Reason for Co-Treatment: To address functional/ADL transfers;For patient/therapist safety PT goals addressed during session: Mobility/safety with mobility OT goals addressed during session: ADL's and self-care      AM-PAC OT "6 Clicks" Daily Activity     Outcome Measure Help from another person eating meals?: None Help from another person taking care of personal grooming?: A Little Help from another person toileting, which includes using toliet, bedpan, or urinal?: Total Help from another person bathing (including washing, rinsing, drying)?: A Lot Help from another person to put on and taking off regular upper body clothing?: A Lot Help from another person to put on and taking off regular lower body clothing?: Total 6 Click Score: 13   End of Session Nurse Communication: Mobility status  Activity Tolerance: Patient limited by pain Patient left: in bed;with call bell/phone within reach;with bed alarm set  OT Visit Diagnosis: Other abnormalities of gait and mobility (R26.89);History of falling (Z91.81);Muscle weakness (generalized) (M62.81)                Time: 0300-9233 OT Time Calculation (min): 35 min Charges:  OT General Charges $OT Visit: 1 Visit OT Evaluation $OT Eval Low Complexity: [place] OT OT pager: [place] 07/05/2021, 1:48 PM

## 2021-07-05 NOTE — Progress Notes (Signed)
PROGRESS NOTE    Clayton [name]  GYI:[id number] DOB: Apr 28, 1928 DOA: 07/02/2021 PCP: Lujean Amel, MD    Brief Narrative:  Clayton [name] is a 88 year old male with past medical history significant for CAD, COPD, type 2 diabetes mellitus, hyperlipidemia, hyperparathyroidism, hypertension, IDA, OSA not on CPAP, BPH, osteoporosis, chronic systolic congestive heart failure, second-degree AV block who presents to [facility] ED on 07/02/2021 from his independent living facility with complaint of diarrhea for 4 days.  Also reporting productive cough with green sputum and shortness of breath.  In the ED, temperature 97.5 F, HR 88, RR 22, BP 160/72, SPO2 78% on room air.  Sodium 138, potassium 3.2, chloride 107, CO2 19, glucose 81, BUN 87, creatinine 0.03, AST 29, ALT 18, total Clayton [name] 0.4.  Lipase 29.  WBC 17.1, hemoglobin 8.7, platelets 420.  COVID-19 PCR negative peer influenza A/B PCR negative.  Chest x-ray with bibasilar atelectasis and small pleural effusion.  CT abdomen/pelvis with worsening left greater than right lower lobe consolidation concerning for aspiration, bronchiectasis with mucoid impaction, constipation without evidence of SBO, moderate-severe dilation of the bladder, no focal bladder wall thickening, 1 cm calcification pancreatic head increased in prominence of the proximal pancreatic duct likely indicating enlarging proximal pancreatic ductal stone, no biliary dilation.  Patient was started on ceftriaxone and metronidazole by EDP.  Hospital service consulted for further evaluation and management of acute hypoxic respiratory failure secondary to multifocal pneumonia with high suspicion of aspiration.   Assessment & Plan:   Principal Problem:   Multifocal pneumonia Active Problems:   Essential hypertension   BPH (benign prostatic hyperplasia)   Chronic kidney disease, stage IV (severe) (HCC)   CAD, multiple vessel   DM (diabetes mellitus), type 2 with renal complications (HCC)   Chronic  heart failure with preserved ejection fraction (HFpEF) (HCC)   Anemia in chronic kidney disease   Persistent atrial fibrillation (HCC)   Prolonged QT interval   Stage III pressure ulcer of sacral region ([place])   Hypokalemia   Hypoglycemia   Mild protein malnutrition (HCC)   Multifocal pneumonia, likely aspiration Patient presenting to the ED from independent living with productive cough of green sputum.  Elevated WBC count of 17.1.  CT abdomen/pelvis notable for diffuse consolidation bilateral lower lobes. --WBC 17.1>>11.2 --Sputum culture with Pseudomonas, awaiting susceptibilities --Cefepime 2g IV q24h --Metronidazole 500mg  IV q8h --O2 now titrated off  Dysphagia Seen by speech therapy, currently recommending regular diet with thin liquids. --Aspiration precautions  Acute renal failure on CKD stage IV Baseline creatinine 2.4, presented with a creatinine of 3.03.  Likely etiology secondary to prerenal acidemia from dehydration versus infection as above with pneumonia. --Cr 3.03>>2.51 --Avoid nephrotoxins, renal dose all medications --BMP in a.m.  Hypokalemia: Resolved Potassium 3.2 on admission.  Repleted during hospitalization.  Chronic systolic congestive heart failure, compensated No vascular congestion noted on imaging, appears euvolemic.  Last TTE with LVEF 45-50%. --Furosemide 20 mg p.o. twice daily --Strict I's and O's and daily weights  Essential hypertension --Imdur 30 mg p.o. daily --Furosemide 20 mg p.o. twice daily  BPH: Finasteride 5 mg p.o. daily  COPD On Symbicort 160-4.5 mcg 2 puffs twice daily at home. --Continue home inhaler with possible substitution of Dulera 2 puffs twice daily --Continue Singulair 10 mg p.o. nightly  CAD --Aspirin --Atorvastatin  Type 2 diabetes mellitus Home regimen includes Lantus 22 units subcutaneously daily, NovoLog 8u with breakfast/lunch, and 9 units with dinner.  Hemoglobin A1c 7.9 on 06/27/2021. --Hold home regimen  for  now --Sensitive SSI for coverage --CBGs qAC/HS  Persistent atrial fibrillation Follows with cardiology outpatient, Dr. Ellyn Hack.  Currently rate controlled on no medications.  Not on anticoagulation due to underlying anemia of chronic disease with likely slow GI bleed.  Iron deficiency anemia Follows with medical oncology outpatient, receives occasional IV iron infusions. --Hgb 7.7; stable  Prolonged QTc interval --Avoid QT prolonging medications --Maintain magnesium > 2.0 and potassium > 4.0  Stage III pressure ulcer sacrum, POA --Continue local wound care, preventative measures, frequent offloading  Mild protein calorie malnutrition Body mass index is 27.52 kg/m. Nutrition Status: Nutrition Problem: Inadequate oral intake Etiology: acute illness, decreased appetite, diarrhea Signs/Symptoms: per patient/family report Interventions: MVI, Boost Breeze -- Dietitian following, continue to encourage increase oral intake  Weakness/abilities/deconditioning: --PT/OT recommending SNF, TOC for evaluation of placement --Continue therapy efforts while inpatient   DVT prophylaxis: SCDs Start: 07/03/21 0750   Code Status: DNR Family Communication: No family present at bedside this morning, updated patient's [name] via telephone this afternoon.  Disposition Plan:  Level of care: Progressive Status is: Inpatient  Remains inpatient appropriate because: Awaiting sputum culture sensitivities, pending SNF placement    Consultants:  None  Procedures:  None  Antimicrobials:  Cefepime 1/18>> Metronidazole 1/16>> Ceftriaxone 1/16 - 1/18     Subjective: Patient seen examined bedside, resting comfortably.  No family present.  Sleeping but easily arousable.  Just completed breakfast.  Seen by PT and OT this morning with recommendation of SNF placement.  No other specific complaints or concerns at this time.  Denies headache, no chest pain, palpitations, no shortness of  breath, no fever/chills/night sweats, no nausea/vomiting/diarrhea, no abdominal pain.  No acute events overnight per nursing staff.  Objective: Vitals:   07/04/21 2027 07/05/21 0034 07/05/21 0543 07/05/21 1311  BP:   (!) 135/52 (!) 147/52  Pulse: 81  75 (!) 58  Resp: 18 (!) 21 20 15   Temp:   98.6 F (37 C) 97.6 F (36.4 C)  TempSrc:   Oral Oral  SpO2: 95%  96% 97%  Weight:      Height:        Intake/Output Summary (Last 24 hours) at 07/05/2021 1437 Last data filed at 07/05/2021 0817 Gross per 24 hour  Intake 597.22 ml  Output 2700 ml  Net -2102.78 ml   Filed Weights   07/03/21 1111  Weight: 87 kg    Examination:  General exam: Appears calm and comfortable, chronically ill/elderly in appearance Respiratory system: Breath sounds slightly decreased bilateral bases, coarse, no wheezing/crackles, normal respiratory effort without accessory muscle use, on room air with SPO2 96% at rest Cardiovascular system: S1 & S2 heard, RRR. No JVD, murmurs, rubs, gallops or clicks. No pedal edema. Gastrointestinal system: Abdomen is nondistended, soft and nontender. No organomegaly or masses felt. Normal bowel sounds heard. Central nervous system: Alert. No focal neurological deficits. Extremities: Symmetric 5 x 5 power. Skin: No rashes, lesions or ulcers Psychiatry: Judgement and insight appear poor. Mood & affect appropriate.     Data Reviewed: I have personally reviewed following labs and imaging studies  CBC: Recent Labs  Lab 07/02/21 2246 07/04/21 0346 07/05/21 0345  WBC 17.1* 10.1 11.2*  NEUTROABS 13.8*  --  8.0*  HGB 8.7* 7.3* 7.7*  HCT 28.9* 24.9* 25.6*  MCV 87.0 87.7 87.1  PLT 420* 333 132   Basic Metabolic Panel: Recent Labs  Lab 07/02/21 2246 07/04/21 0346 07/05/21 0345  NA 138 137 137  K 3.2* 3.4* 4.5  CL  107 109 111  CO2 19* 19* 19*  GLUCOSE 81 159* 224*  BUN 87* 84* 80*  CREATININE 3.03* 2.77* 2.51*  CALCIUM 8.2* 7.6* 8.0*  MG  --   --  1.9  PHOS  --    --  4.6   GFR: Estimated Creatinine Clearance: 19.8 mL/min (A) (by C-G formula based on SCr of 2.51 mg/dL (H)). Liver Function Tests: Recent Labs  Lab 07/02/21 2246 07/04/21 0346 07/05/21 0345  AST 29 20 16   ALT 18 15 14   ALKPHOS 85 71 73  BILITOT 0.4 0.6 0.5  PROT 7.1 5.6* 5.8*  ALBUMIN 3.1* 2.4* 2.5*   Recent Labs  Lab 07/02/21 2246  LIPASE 29   No results for input(s): AMMONIA in the last 168 hours. Coagulation Profile: No results for input(s): INR, PROTIME in the last 168 hours. Cardiac Enzymes: No results for input(s): CKTOTAL, CKMB, CKMBINDEX, TROPONINI in the last 168 hours. BNP (last 3 results) No results for input(s): PROBNP in the last 8760 hours. HbA1C: No results for input(s): HGBA1C in the last 72 hours. CBG: Recent Labs  Lab 07/04/21 1135 07/04/21 1628 07/04/21 2249 07/05/21 0743 07/05/21 1153  GLUCAP 177* 168* 237* 163* 291*   Lipid Profile: No results for input(s): CHOL, HDL, LDLCALC, TRIG, CHOLHDL, LDLDIRECT in the last 72 hours. Thyroid Function Tests: No results for input(s): TSH, T4TOTAL, FREET4, T3FREE, THYROIDAB in the last 72 hours. Anemia Panel: No results for input(s): VITAMINB12, FOLATE, FERRITIN, TIBC, IRON, RETICCTPCT in the last 72 hours. Sepsis Labs: Recent Labs  Lab 07/05/21 0345  PROCALCITON 0.16    Recent Results (from the past 240 hour(s))  Resp Panel by RT-PCR (Flu A&B, Covid) Nasopharyngeal Swab     Status: None   Collection Time: 07/02/21 10:40 PM   Specimen: Nasopharyngeal Swab; Nasopharyngeal(NP) swabs in vial transport medium  Result Value Ref Range Status   SARS Coronavirus 2 by RT PCR NEGATIVE NEGATIVE Final    Comment: (NOTE) SARS-CoV-2 target nucleic acids are NOT DETECTED.  The SARS-CoV-2 RNA is generally detectable in upper respiratory specimens during the acute phase of infection. The lowest concentration of SARS-CoV-2 viral copies this assay can detect is 138 copies/mL. A negative result does not  preclude SARS-Cov-2 infection and should not be used as the sole basis for treatment or other patient management decisions. A negative result may occur with  improper specimen collection/handling, submission of specimen other than nasopharyngeal swab, presence of viral mutation(s) within the areas targeted by this assay, and inadequate number of viral copies(<138 copies/mL). A negative result must be combined with clinical observations, patient history, and epidemiological information. The expected result is Negative.  Fact Sheet for Patients:  EntrepreneurPulse.com.au  Fact Sheet for Healthcare Providers:  IncredibleEmployment.be  This test is no t yet approved or cleared by the [place] FDA and  has been authorized for detection and/or diagnosis of SARS-CoV-2 by FDA under an Emergency Use Authorization (EUA). This EUA will remain  in effect (meaning this test can be used) for the duration of the COVID-19 declaration under Section 564(b)(1) of the Act, 21 U.S.C.section 360bbb-3(b)(1), unless the authorization is terminated  or revoked sooner.       Influenza A by PCR NEGATIVE NEGATIVE Final   Influenza B by PCR NEGATIVE NEGATIVE Final    Comment: (NOTE) The Xpert Xpress SARS-CoV-2/FLU/RSV plus assay is intended as an aid in the diagnosis of influenza from Nasopharyngeal swab specimens and should not be used as a sole basis for treatment. Nasal  washings and aspirates are unacceptable for Xpert Xpress SARS-CoV-2/FLU/RSV testing.  Fact Sheet for Patients: EntrepreneurPulse.com.au  Fact Sheet for Healthcare Providers: IncredibleEmployment.be  This test is not yet approved or cleared by the [place] FDA and has been authorized for detection and/or diagnosis of SARS-CoV-2 by FDA under an Emergency Use Authorization (EUA). This EUA will remain in effect (meaning this test can be used) for the  duration of the COVID-19 declaration under Section 564(b)(1) of the Act, 21 U.S.C. section 360bbb-3(b)(1), unless the authorization is terminated or revoked.  Performed at [facility], [place] [address], [place], [place] [address]   Expectorated Sputum Assessment w Gram Stain, Rflx to Resp Cult     Status: None   Collection Time: 07/03/21 12:25 PM   Specimen: Sputum  Result Value Ref Range Status   Specimen Description SPUTUM  Final   Special Requests NONE  Final   Sputum evaluation   Final    THIS SPECIMEN IS ACCEPTABLE FOR SPUTUM CULTURE Performed at [facility], [place] [address], [place], [place] [address]    Report Status 07/03/2021 FINAL  Final  Culture, Respiratory w Gram Stain     Status: None   Collection Time: 07/03/21 12:25 PM   Specimen: SPU  Result Value Ref Range Status   Specimen Description   Final    SPUTUM Performed at [place] [address], [place], [place] [address]    Special Requests   Final    NONE Reflexed from [id number] Performed at [facility], [place] [address], [place], [place] [address]    Gram Stain   Final    NO SQUAMOUS EPITHELIAL CELLS SEEN FEW WBC SEEN FEW GRAM POSITIVE COCCI    Culture   Final    RARE PSEUDOMONAS AERUGINOSA NO STAPHYLOCOCCUS AUREUS ISOLATED Performed at [facility], [address], [place], [place] [address]    Report Status 07/05/2021 FINAL  Final   Organism ID, Bacteria PSEUDOMONAS AERUGINOSA  Final      Susceptibility   Pseudomonas aeruginosa - MIC*    CEFTAZIDIME 2 SENSITIVE Sensitive     CIPROFLOXACIN <=0.25 SENSITIVE Sensitive     GENTAMICIN 2 SENSITIVE Sensitive     IMIPENEM 2 SENSITIVE Sensitive     PIP/TAZO <=4 SENSITIVE Sensitive     CEFEPIME 2 SENSITIVE Sensitive     * RARE PSEUDOMONAS AERUGINOSA         Radiology Studies: No results found.      Scheduled Meds:  aspirin EC  81 mg Oral Daily   atorvastatin  40  mg Oral Once per day on Mon Wed Fri Sat   cholecalciferol  2,000 Units Oral Daily   feeding supplement  1 Container Oral BID BM   finasteride  5 mg Oral Daily   furosemide  20 mg Oral BID   ipratropium-albuterol  3 mL Nebulization BID   isosorbide mononitrate  30 mg Oral Daily   melatonin  5 mg Oral QHS   mometasone-formoterol  2 puff Inhalation BID   montelukast  10 mg Oral Daily   multivitamin with minerals  1 tablet Oral Daily   Continuous Infusions:  sodium chloride 10 mL/hr at 07/05/21 1419   ceFEPime (MAXIPIME) IV 2 g (07/05/21 1422)   metronidazole 500 mg (07/05/21 0436)     LOS: 2 days    Time spent: 48 minutes spent on chart review, discussion with nursing staff, consultants, updating family and interview/physical exam; more than  50% of that time was spent in counseling and/or coordination of care.    Zephyr Ridley J [place] DO [facility] Available via Epic secure chat 7am-7pm After these hours, please refer to coverage provider listed on [email or web address] 07/05/2021, 2:37 PM

## 2021-07-06 LAB — CBC
HCT: 26.8 % — ABNORMAL LOW (ref 39.0–52.0)
Hemoglobin: 8.3 g/dL — ABNORMAL LOW (ref 13.0–17.0)
MCH: 26.6 pg (ref 26.0–34.0)
MCHC: 31 g/dL (ref 30.0–36.0)
MCV: 85.9 fL (ref 80.0–100.0)
Platelets: 352 10*3/uL (ref 150–400)
RBC: 3.12 MIL/uL — ABNORMAL LOW (ref 4.22–5.81)
RDW: 17.2 % — ABNORMAL HIGH (ref 11.5–15.5)
WBC: 12.3 10*3/uL — ABNORMAL HIGH (ref 4.0–10.5)
nRBC: 0 % (ref 0.0–0.2)

## 2021-07-06 LAB — BASIC METABOLIC PANEL
Anion gap: 10 (ref 5–15)
BUN: 70 mg/dL — ABNORMAL HIGH (ref 8–23)
CO2: 17 mmol/L — ABNORMAL LOW (ref 22–32)
Calcium: 8.1 mg/dL — ABNORMAL LOW (ref 8.9–10.3)
Chloride: 110 mmol/L (ref 98–111)
Creatinine, Ser: 2.51 mg/dL — ABNORMAL HIGH (ref 0.61–1.24)
GFR, Estimated: 24 mL/min — ABNORMAL LOW (ref >60)
Glucose, Bld: 336 mg/dL — ABNORMAL HIGH (ref 70–99)
Potassium: 4.6 mmol/L (ref 3.5–5.1)
Sodium: 137 mmol/L (ref 135–145)

## 2021-07-06 LAB — GLUCOSE, CAPILLARY
Glucose-Capillary: 269 mg/dL — ABNORMAL HIGH (ref 70–99)
Glucose-Capillary: 391 mg/dL — ABNORMAL HIGH (ref 70–99)
Glucose-Capillary: 290 mg/dL — ABNORMAL HIGH (ref 70–99)
Glucose-Capillary: 257 mg/dL — ABNORMAL HIGH (ref 70–99)
Glucose-Capillary: 398 mg/dL — ABNORMAL HIGH (ref 70–99)
Glucose-Capillary: 401 mg/dL — ABNORMAL HIGH (ref 70–99)

## 2021-07-06 LAB — MAGNESIUM: Magnesium: 1.8 mg/dL (ref 1.7–2.4)

## 2021-07-06 MED ORDER — AMOXICILLIN-POT CLAVULANATE 500-125 MG PO TABS
1.0000 | ORAL_TABLET | Freq: Two times a day (BID) | ORAL | Status: DC
  Administered 2021-07-06 – 2021-07-09 (×6): 500 mg via ORAL
  Filled 2021-07-06 (×7): qty 1

## 2021-07-06 MED ORDER — DIPHENHYDRAMINE HCL 25 MG PO CAPS: 25.0000 mg | ORAL_CAPSULE | Freq: Once | ORAL | Status: DC | PRN

## 2021-07-06 MED ORDER — INSULIN GLARGINE-YFGN 100 UNIT/ML ~~LOC~~ SOLN
15.0000 [IU] | Freq: Every day | SUBCUTANEOUS | Status: DC
  Administered 2021-07-06: 15 [IU] via SUBCUTANEOUS
  Filled 2021-07-06 (×2): qty 0.15

## 2021-07-06 NOTE — Progress Notes (Signed)
Speech Language Pathology Treatment: Dysphagia  Patient Details Name: Clayton [name] MRN: [id number] DOB: [birth year] Today's Date: 07/06/2021 Time: 9450-3888 SLP Time Calculation (min) (ACUTE ONLY): 10 min  Assessment / Plan / Recommendation Clinical Impression  SLP visit to address dysphagia goals.  Pt found reclining in bed to approx 37* with near empty meal tray in front of him.  He denies dysphagia issues - choking on food, liquid nor pills.  Oral cavity with mild residuals of eggs throughout - which SlP instructed pt to drink liquids to clear.  Intake of water and coffee faciliated clearance. Pt admitted to awareness to retention but did not clear independently - requiring mod cues. Using teach back, pt educated to precautions.  With liquids, eructation noted post swallow- pt denies any refluxing.  Barrier to proper positioning with meals is his back pain, thus requested RN and pt to time his pain medicine before meals to allow optimal positioning for airway protection. Pt and RN agreeable.  Recommend continue diet with general precautions.  Will follow up once more to determine if compensations found to be generalized and helpful.  Thanks.  Highly recommend pt follow esophageal precautions give his COPD and eructation.    HPI HPI: Patient is a 88 y.o. male with PMH: IDDM, HTN, CKD, IDA, CAD s/p PCI, HLD, and COPD presents from SNF for diarrhea. Diarrhea has been constant x 4 days. He has had an associated decreased appetite but no vomitting. He has had productive cough of green sputum per daughter's report in ED. CT abdomen/pelvis revealed Worsening left-greater-than-right lower lobe consolidation, consider acute on chronic aspiration. Background bronchiectasis with scattered mucoid impaction; anterior lung bases are clear.      SLP Plan  Continue with current plan of care      Recommendations for follow up therapy are one component of a multi-disciplinary discharge planning process, led  by the attending physician.  Recommendations may be updated based on patient status, additional functional criteria and insurance authorization.    Recommendations  Diet recommendations: Regular;Thin liquid Liquids provided via: Straw;Cup Medication Administration: Whole meds with liquid Compensations: Slow rate;Small sips/bites Postural Changes and/or Swallow Maneuvers: Seated upright 90 degrees;Upright 30-60 min after meal                Oral Care Recommendations: Oral care BID Follow Up Recommendations: No SLP follow up Assistance recommended at discharge: None SLP Visit Diagnosis: Dysphagia, unspecified (R13.10) Plan: Continue with current plan of care           Macario Golds. Kathleen Lime, [place] Office [phone] Cell [phone]    07/06/2021, 9:32 AM

## 2021-07-06 NOTE — NC FL2 (Signed)
[place] LEVEL OF CARE SCREENING TOOL     IDENTIFICATION  Patient Name: Clayton [name] Birthdate: 1927/12/25 Sex: male Admission Date (Current Location): 07/02/2021  [facility] and [place] Number:  Herbalist and Address:  [facility],  [place], [place]      Provider Number: [id number]  Attending Physician Name and Address:  [place] Eric J, DO  Relative Name and Phone Number:       Current Level of Care: Hospital Recommended Level of Care: [place] Prior Approval Number:    Date Approved/Denied:   PASRR Number: [id number]  Discharge Plan: SNF    Current Diagnoses: Patient Active Problem List   Diagnosis Date Noted   Multifocal pneumonia 07/03/2021   Prolonged QT interval 07/03/2021   Stage III pressure ulcer of sacral region ([place]) 07/03/2021   Hypokalemia 07/03/2021   Hypoglycemia 07/03/2021   Mild protein malnutrition ([place]) 07/03/2021   Persistent atrial fibrillation ([place]) 05/13/2021   Anemia in chronic kidney disease 03/15/2021   DM (diabetes mellitus), type 2 with renal complications ([place]) 38/25/0539   Chronic heart failure with preserved ejection fraction (HFpEF) ([place]) 03/11/2021   Symptomatic anemia 03/11/2021   Elevated troponin 03/11/2021   Weight loss, unintentional 09/12/2020   Leukocytosis 09/12/2020   Unsteady gait when walking 12/14/2019   Rhonchi at both lung bases    AV heart block    CAD, multiple vessel    Normocytic normochromic anemia    Chronic obstructive pulmonary disease (HCC)    ST-segment elevation myocardial infarction (STEMI) of inferior wall ([place]) 04/20/2018   Symptomatic bradycardia 04/20/2018   Presence of drug-eluting stent in right coronary artery 04/18/2018   Iron deficiency 10/30/2016   Cellulitis of right hand 08/11/2016   Chronic kidney disease, stage IV (severe) ([place]) 08/07/2016   Type II or unspecified type diabetes mellitus with renal manifestations,  uncontrolled(250.42) 02/03/2014   BPH (benign prostatic hyperplasia) 07/03/2013   Hyperlipidemia associated with type 2 diabetes mellitus ([place]) 04/06/2013   Dyspnea 03/04/2013   Smoking history 07/24/2012   Cough variant asthma 07/24/2012   Solitary pulmonary nodule 06/27/2012   Hypoxia 06/19/2012   Diabetes mellitus ([place]) 06/19/2012   Hard of hearing 06/19/2012   Essential hypertension 05/19/2010   OSA treated with BiPAP 05/19/2010    Orientation RESPIRATION BLADDER Height & Weight     Self, Time, Situation, Place  Normal Continent Weight: 87 kg Height:  5\' 10"  (177.8 cm)  BEHAVIORAL SYMPTOMS/MOOD NEUROLOGICAL BOWEL NUTRITION STATUS      Continent Diet (regular)  AMBULATORY STATUS COMMUNICATION OF NEEDS Skin   Extensive Assist Verbally Normal                       Personal Care Assistance Level of Assistance  Bathing, Feeding, Dressing Bathing Assistance: Limited assistance Feeding assistance: Limited assistance Dressing Assistance: Limited assistance     Functional Limitations Info  Sight, Hearing, Speech Sight Info: Adequate Hearing Info: Adequate Speech Info: Adequate    SPECIAL CARE FACTORS FREQUENCY  PT (By licensed PT), OT (By licensed OT)     PT Frequency: 5xweekly OT Frequency: 5xweekly            Contractures Contractures Info: Not present    Additional Factors Info  Code Status Code Status Info: dnr             Current Medications (07/06/2021):  This is the current hospital active medication list Current Facility-Administered Medications  Medication  Dose Route Frequency Provider Last Rate Last Admin   0.9 %  sodium chloride infusion   Intravenous PRN [place] Eric J, DO 10 mL/hr at 07/05/21 1657 New Bag at 07/05/21 1657   acetaminophen (TYLENOL) tablet 650 mg  650 mg Oral Q6H PRN Reubin Milan, MD   650 mg at 07/03/21 1804   Or   acetaminophen (TYLENOL) suppository 650 mg  650 mg Rectal Q6H PRN Reubin Milan, MD        aspirin EC tablet 81 mg  81 mg Oral Daily Reubin Milan, MD   81 mg at 07/05/21 4665   atorvastatin (LIPITOR) tablet 40 mg  40 mg Oral Once per day on Mon Wed Fri Sat Reubin Milan, MD   40 mg at 07/05/21 0950   ceFEPIme (MAXIPIME) 2 g in sodium chloride 0.9 % 100 mL IVPB  2 g Intravenous Q24H [place] Eric J, DO 200 mL/hr at 07/05/21 1658 Restarted at 07/05/21 1658   cholecalciferol (VITAMIN D3) tablet 2,000 Units  2,000 Units Oral Daily Reubin Milan, MD   2,000 Units at 07/05/21 0926   dextrose 50 % solution 25 g  25 g Intravenous PRN Reubin Milan, MD       feeding supplement (BOOST / RESOURCE BREEZE) liquid 1 Container  1 Container Oral BID BM Reubin Milan, MD   1 Container at 07/05/21 2030   finasteride (PROSCAR) tablet 5 mg  5 mg Oral Daily Reubin Milan, MD   5 mg at 07/05/21 9935   furosemide (LASIX) tablet 20 mg  20 mg Oral BID Reubin Milan, MD   20 mg at 07/06/21 0646   guaiFENesin-dextromethorphan (ROBITUSSIN DM) 100-10 MG/5ML syrup 5 mL  5 mL Oral Q4H PRN Irene Pap N, DO   5 mL at 07/05/21 2216   insulin aspart (novoLOG) injection 0-5 Units  0-5 Units Subcutaneous QHS [place] Eric J, DO   3 Units at 07/05/21 2130   insulin aspart (novoLOG) injection 0-9 Units  0-9 Units Subcutaneous TID WC [place] Eric J, DO   5 Units at 07/06/21 0850   insulin glargine-yfgn [facility] injection 15 Units  15 Units Subcutaneous Daily [place] Eric J, DO       ipratropium-albuterol (DUONEB) 0.5-2.5 (3) MG/3ML nebulizer solution 3 mL  3 mL Nebulization Q4H PRN Reubin Milan, MD   3 mL at 07/03/21 2028   ipratropium-albuterol (DUONEB) 0.5-2.5 (3) MG/3ML nebulizer solution 3 mL  3 mL Nebulization TID [place] Eric J, DO   3 mL at 07/06/21 0549   isosorbide mononitrate (IMDUR) 24 hr tablet 30 mg  30 mg Oral Daily Reubin Milan, MD   30 mg at 07/05/21 7017   liver oil-zinc oxide (DESITIN) 40 % ointment   Topical PRN Reubin Milan, MD        melatonin tablet 5 mg  5 mg Oral QHS Reubin Milan, MD   5 mg at 07/05/21 2209   metroNIDAZOLE (FLAGYL) IVPB 500 mg  500 mg Intravenous Q8H Antonietta Breach, PA-C 100 mL/hr at 07/06/21 0646 500 mg at 07/06/21 0646   mometasone-formoterol (DULERA) 200-5 MCG/ACT inhaler 2 puff  2 puff Inhalation BID Reubin Milan, MD   2 puff at 07/06/21 0550   montelukast (SINGULAIR) tablet 10 mg  10 mg Oral Daily Reubin Milan, MD   10 mg at 07/05/21 2209   multivitamin with minerals tablet 1 tablet  1 tablet Oral Daily Reubin Milan, MD  1 tablet at 07/05/21 4536   neomycin-bacitracin-polymyxin (NEOSPORIN) ointment packet 1 application  1 application Topical BID PRN Reubin Milan, MD       nitroGLYCERIN (NITROSTAT) SL tablet 0.4 mg  0.4 mg Sublingual Q5 Min x 3 PRN Reubin Milan, MD       polyethylene glycol [facility] / Floria Raveling) packet 17 g  17 g Oral Daily PRN Kayleen Memos, DO         Discharge Medications: Please see discharge summary for a list of discharge medications.  Relevant Imaging Results:  Relevant Lab Results:   Additional Information ssn: [id number]  Leeroy Cha, RN

## 2021-07-06 NOTE — Progress Notes (Signed)
End of shift  Wound care consulted regarding painful red toes on pt's rt foot.  They suggested family see a podiatrist.  Pt transitioned to PO abx from IV today.   Anticipate pt discharging to a SNF perhaps 07/07/20.

## 2021-07-06 NOTE — Consult Note (Signed)
WOC Nurse Consult Note: Patient receiving care in [place]. Daughter in room contributing history of reason for [facility] consult. Reason for Consult: "right foot wound" Wound type: NO open wound observed. Daughter tells me the right third toe tip, which is reddened but without edema or open wound, has been there " a long time and is really sore".  When I asked if the patient goes to a podiatrist, she responded he has not been to a podiatrist. I explained that might be the best avenue to determine the cause and any needed intervention.  This does not appear as a pressure injury, nor anything arterial in nature. Pressure Injury POA: Yes/No/NA I do not recommend any topical treatment at this time to this toe tip.  Thank you for the consult.  Discussed plan of care with the patient and bedside nurse.  [place] nurse will not follow at this time.  Please re-consult the [place] team if needed.  Val Riles, RN, MSN, CWOCN, CNS-BC, pager [phone]

## 2021-07-06 NOTE — Care Management Important Message (Signed)
Important Message  Patient Details IM Letter placed in Patients room. Name: Clayton [name] MRN: [id number] Date of Birth: 03/01/1928   Medicare Important Message Given:  Yes     Kerin Salen 07/06/2021, 10:56 AM

## 2021-07-06 NOTE — TOC Progression Note (Addendum)
Transition of Care [facility]) - Progression Note    Patient Details  Name: Clayton [name] MRN: [id number] Date of Birth: 1928/06/07  Transition of [facility]) CM/SW Contact  Leeroy Cha, RN Phone Number: 07/06/2021, 9:48 AM  Clinical Narrative:    Pt recommending short stay snf fl2 sent out to area snfs for review. Tcf-daughter-looking at [employer] and [facility] will make decision tomorrow  Expected Discharge Plan: [place] Barriers to Discharge: Continued Medical Work up  Expected Discharge Plan and Services Expected Discharge Plan: [place]   Discharge Planning Services: CM Consult Post Acute Care Choice: [place] Living arrangements for the past 2 months: Single Family Home                                       Social Determinants of Health (SDOH) Interventions    Readmission Risk Interventions No flowsheet data found.

## 2021-07-06 NOTE — Progress Notes (Signed)
PROGRESS NOTE    Clayton [name]  SVX:[id number] DOB: [birth year] DOA: 07/02/2021 PCP: Lujean Amel, MD    Brief Narrative:  Clayton [name] is a 88 year old male with past medical history significant for CAD, COPD, type 2 diabetes mellitus, hyperlipidemia, hyperparathyroidism, hypertension, IDA, OSA not on CPAP, BPH, osteoporosis, chronic systolic congestive heart failure, second-degree AV block who presents to [facility] ED on 07/02/2021 from his independent living facility with complaint of diarrhea for 4 days.  Also reporting productive cough with green sputum and shortness of breath.  In the ED, temperature 97.5 F, HR 88, RR 22, BP 160/72, SPO2 78% on room air.  Sodium 138, potassium 3.2, chloride 107, CO2 19, glucose 81, BUN 87, creatinine 0.03, AST 29, ALT 18, total Ruben 0.4.  Lipase 29.  WBC 17.1, hemoglobin 8.7, platelets 420.  COVID-19 PCR negative peer influenza A/B PCR negative.  Chest x-ray with bibasilar atelectasis and small pleural effusion.  CT abdomen/pelvis with worsening left greater than right lower lobe consolidation concerning for aspiration, bronchiectasis with mucoid impaction, constipation without evidence of SBO, moderate-severe dilation of the bladder, no focal bladder wall thickening, 1 cm calcification pancreatic head increased in prominence of the proximal pancreatic duct likely indicating enlarging proximal pancreatic ductal stone, no biliary dilation.  Patient was started on ceftriaxone and metronidazole by EDP.  Hospital service consulted for further evaluation and management of acute hypoxic respiratory failure secondary to multifocal pneumonia with high suspicion of aspiration.   Assessment & Plan:   Principal Problem:   Multifocal pneumonia Active Problems:   Essential hypertension   BPH (benign prostatic hyperplasia)   Chronic kidney disease, stage IV (severe) (HCC)   CAD, multiple vessel   DM (diabetes mellitus), type 2 with renal complications (HCC)   Chronic  heart failure with preserved ejection fraction (HFpEF) (HCC)   Anemia in chronic kidney disease   Persistent atrial fibrillation (HCC)   Prolonged QT interval   Stage III pressure ulcer of sacral region ([place])   Hypokalemia   Hypoglycemia   Mild protein malnutrition (HCC)   Multifocal pneumonia, likely aspiration Patient presenting to the ED from independent living with productive cough of green sputum.  Elevated WBC count of 17.1.  CT abdomen/pelvis notable for diffuse consolidation bilateral lower lobes.  Sputum culture with Pseudomonas. --WBC 17.1>>11.2>12.3 --Cefepime 2g IV q24h --Metronidazole 500mg  IV q8h --O2 now titrated off  Dysphagia Seen by speech therapy, currently recommending regular diet with thin liquids. --Aspiration precautions  Acute renal failure on CKD stage IV Baseline creatinine 2.4, presented with a creatinine of 3.03.  Likely etiology secondary to prerenal acidemia from dehydration versus infection as above with pneumonia. --Cr 3.03>>2.51>2.51 --Avoid nephrotoxins, renal dose all medications --BMP in a.m.  Hypokalemia: Resolved Potassium 3.2 on admission.  Repleted during hospitalization.  Chronic systolic congestive heart failure, compensated No vascular congestion noted on imaging, appears euvolemic.  Last TTE with LVEF 45-50%. --Furosemide 20 mg p.o. twice daily --Strict I's and O's and daily weights  Essential hypertension --Imdur 30 mg p.o. daily --Furosemide 20 mg p.o. twice daily  BPH: Finasteride 5 mg p.o. daily  COPD On Symbicort 160-4.5 mcg 2 puffs twice daily at home. --Continue home inhaler with possible substitution of Dulera 2 puffs twice daily --Continue Singulair 10 mg p.o. nightly  CAD --Aspirin --Atorvastatin  Type 2 diabetes mellitus Home regimen includes Lantus 22 units subcutaneously daily, NovoLog 8u with breakfast/lunch, and 9 units with dinner.  Hemoglobin A1c 7.9 on 06/27/2021. -- Semglee 15 units [place]  daily --Sensitive SSI for coverage --CBGs qAC/HS  Persistent atrial fibrillation Follows with cardiology outpatient, Dr. Ellyn Hack.  Currently rate controlled on no medications.  Not on anticoagulation due to underlying anemia of chronic disease with likely slow GI bleed.  Iron deficiency anemia Follows with medical oncology outpatient, receives occasional IV iron infusions. --Hgb 8.3; stable  Prolonged QTc interval --Avoid QT prolonging medications --Maintain magnesium > 2.0 and potassium > 4.0  Stage III pressure ulcer sacrum, POA --Continue local wound care, preventative measures, frequent offloading  Mild protein calorie malnutrition Body mass index is 27.52 kg/m. Nutrition Status: Nutrition Problem: Inadequate oral intake Etiology: acute illness, decreased appetite, diarrhea Signs/Symptoms: per patient/family report Interventions: MVI, Boost Breeze -- Dietitian following, continue to encourage increase oral intake  Weakness/abilities/deconditioning: --PT/OT recommending SNF, TOC for placement --Continue therapy efforts while inpatient   DVT prophylaxis: SCDs Start: 07/03/21 0750   Code Status: DNR Family Communication: No family present at bedside this morning, updated patient's [name] via telephone yesterday afternoon.  Disposition Plan:  Level of care: Progressive Status is: Inpatient  Remains inpatient appropriate because: pending SNF placement    Consultants:  None  Procedures:  None  Antimicrobials:  Cefepime 1/18>> Metronidazole 1/16>> Ceftriaxone 1/16 - 1/18     Subjective: Patient seen examined bedside, resting comfortably.  Eating breakfast.  No family present.  Does not want to sit up while he is eating breakfast.  No other specific complaints or concerns at this time.  Denies headache, no chest pain, palpitations, no shortness of breath, no fever/chills/night sweats, no nausea/vomiting/diarrhea, no abdominal pain.  No acute events  overnight per nursing staff.  Objective: Vitals:   07/05/21 1311 07/05/21 2000 07/05/21 2059 07/06/21 0627  BP: (!) 147/52  (!) 139/52 (!) 135/48  Pulse: (!) 58  90 80  Resp: 15 19 20 20   Temp: 97.6 F (36.4 C)  98.4 F (36.9 C) 99.1 F (37.3 C)  TempSrc: Oral  Oral Oral  SpO2: 97%  96% 100%  Weight:      Height:        Intake/Output Summary (Last 24 hours) at 07/06/2021 1058 Last data filed at 07/06/2021 6962 Gross per 24 hour  Intake 262.36 ml  Output 2050 ml  Net -1787.64 ml   Filed Weights   07/03/21 1111  Weight: 87 kg    Examination:  General exam: Appears calm and comfortable, chronically ill/elderly in appearance Respiratory system: Breath sounds slightly decreased bilateral bases, coarse, no wheezing/crackles, normal respiratory effort without accessory muscle use, on room air with SPO2 100% at rest Cardiovascular system: S1 & S2 heard, RRR. No JVD, murmurs, rubs, gallops or clicks. No pedal edema. Gastrointestinal system: Abdomen is nondistended, soft and nontender. No organomegaly or masses felt. Normal bowel sounds heard. Central nervous system: Alert. No focal neurological deficits. Extremities: Symmetric 5 x 5 power. Skin: No rashes, lesions or ulcers Psychiatry: Judgement and insight appear poor. Mood & affect appropriate.     Data Reviewed: I have personally reviewed following labs and imaging studies  CBC: Recent Labs  Lab 07/02/21 2246 07/04/21 0346 07/05/21 0345 07/06/21 0324  WBC 17.1* 10.1 11.2* 12.3*  NEUTROABS 13.8*  --  8.0*  --   HGB 8.7* 7.3* 7.7* 8.3*  HCT 28.9* 24.9* 25.6* 26.8*  MCV 87.0 87.7 87.1 85.9  PLT 420* 333 351 952   Basic Metabolic Panel: Recent Labs  Lab 07/02/21 2246 07/04/21 0346 07/05/21 0345 07/06/21 0324  NA 138 137 137 137  K 3.2* 3.4*  4.5 4.6  CL 107 109 111 110  CO2 19* 19* 19* 17*  GLUCOSE 81 159* 224* 336*  BUN 87* 84* 80* 70*  CREATININE 3.03* 2.77* 2.51* 2.51*  CALCIUM 8.2* 7.6* 8.0* 8.1*  MG   --   --  1.9 1.8  PHOS  --   --  4.6  --    GFR: Estimated Creatinine Clearance: 19.8 mL/min (A) (by C-G formula based on SCr of 2.51 mg/dL (H)). Liver Function Tests: Recent Labs  Lab 07/02/21 2246 07/04/21 0346 07/05/21 0345  AST 29 20 16   ALT 18 15 14   ALKPHOS 85 71 73  BILITOT 0.4 0.6 0.5  PROT 7.1 5.6* 5.8*  ALBUMIN 3.1* 2.4* 2.5*   Recent Labs  Lab 07/02/21 2246  LIPASE 29   No results for input(s): AMMONIA in the last 168 hours. Coagulation Profile: No results for input(s): INR, PROTIME in the last 168 hours. Cardiac Enzymes: No results for input(s): CKTOTAL, CKMB, CKMBINDEX, TROPONINI in the last 168 hours. BNP (last 3 results) No results for input(s): PROBNP in the last 8760 hours. HbA1C: No results for input(s): HGBA1C in the last 72 hours. CBG: Recent Labs  Lab 07/05/21 0743 07/05/21 1153 07/05/21 1707 07/05/21 2114 07/06/21 0742  GLUCAP 163* 291* 283* 295* 269*   Lipid Profile: No results for input(s): CHOL, HDL, LDLCALC, TRIG, CHOLHDL, LDLDIRECT in the last 72 hours. Thyroid Function Tests: No results for input(s): TSH, T4TOTAL, FREET4, T3FREE, THYROIDAB in the last 72 hours. Anemia Panel: No results for input(s): VITAMINB12, FOLATE, FERRITIN, TIBC, IRON, RETICCTPCT in the last 72 hours. Sepsis Labs: Recent Labs  Lab 07/05/21 0345  PROCALCITON 0.16    Recent Results (from the past 240 hour(s))  Resp Panel by RT-PCR (Flu A&B, Covid) Nasopharyngeal Swab     Status: None   Collection Time: 07/02/21 10:40 PM   Specimen: Nasopharyngeal Swab; Nasopharyngeal(NP) swabs in vial transport medium  Result Value Ref Range Status   SARS Coronavirus 2 by RT PCR NEGATIVE NEGATIVE Final    Comment: (NOTE) SARS-CoV-2 target nucleic acids are NOT DETECTED.  The SARS-CoV-2 RNA is generally detectable in upper respiratory specimens during the acute phase of infection. The lowest concentration of SARS-CoV-2 viral copies this assay can detect is 138 copies/mL.  A negative result does not preclude SARS-Cov-2 infection and should not be used as the sole basis for treatment or other patient management decisions. A negative result may occur with  improper specimen collection/handling, submission of specimen other than nasopharyngeal swab, presence of viral mutation(s) within the areas targeted by this assay, and inadequate number of viral copies(<138 copies/mL). A negative result must be combined with clinical observations, patient history, and epidemiological information. The expected result is Negative.  Fact Sheet for Patients:  EntrepreneurPulse.com.au  Fact Sheet for Healthcare Providers:  IncredibleEmployment.be  This test is no t yet approved or cleared by the [place] FDA and  has been authorized for detection and/or diagnosis of SARS-CoV-2 by FDA under an Emergency Use Authorization (EUA). This EUA will remain  in effect (meaning this test can be used) for the duration of the COVID-19 declaration under Section 564(b)(1) of the Act, 21 U.S.C.section 360bbb-3(b)(1), unless the authorization is terminated  or revoked sooner.       Influenza A by PCR NEGATIVE NEGATIVE Final   Influenza B by PCR NEGATIVE NEGATIVE Final    Comment: (NOTE) The Xpert Xpress SARS-CoV-2/FLU/RSV plus assay is intended as an aid in the diagnosis of influenza from Nasopharyngeal  swab specimens and should not be used as a sole basis for treatment. Nasal washings and aspirates are unacceptable for Xpert Xpress SARS-CoV-2/FLU/RSV testing.  Fact Sheet for Patients: EntrepreneurPulse.com.au  Fact Sheet for Healthcare Providers: IncredibleEmployment.be  This test is not yet approved or cleared by the [place] FDA and has been authorized for detection and/or diagnosis of SARS-CoV-2 by FDA under an Emergency Use Authorization (EUA). This EUA will remain in effect (meaning this test  can be used) for the duration of the COVID-19 declaration under Section 564(b)(1) of the Act, 21 U.S.C. section 360bbb-3(b)(1), unless the authorization is terminated or revoked.  Performed at [facility], [place] [address], [place], [place] [address]   Expectorated Sputum Assessment w Gram Stain, Rflx to Resp Cult     Status: None   Collection Time: 07/03/21 12:25 PM   Specimen: Sputum  Result Value Ref Range Status   Specimen Description SPUTUM  Final   Special Requests NONE  Final   Sputum evaluation   Final    THIS SPECIMEN IS ACCEPTABLE FOR SPUTUM CULTURE Performed at [facility], [place] [address] [place], [place] [address]    Report Status 07/03/2021 FINAL  Final  Culture, Respiratory w Gram Stain     Status: None   Collection Time: 07/03/21 12:25 PM   Specimen: SPU  Result Value Ref Range Status   Specimen Description   Final    SPUTUM Performed at [place] [address], [place], [place] [address]    Special Requests   Final    NONE Reflexed from [id number] Performed at [facility], [place] [address] [place], [place] [address]    Gram Stain   Final    NO SQUAMOUS EPITHELIAL CELLS SEEN FEW WBC SEEN FEW GRAM POSITIVE COCCI    Culture   Final    RARE PSEUDOMONAS AERUGINOSA NO STAPHYLOCOCCUS AUREUS ISOLATED Performed at [facility], [address], [place], [place] [address]    Report Status 07/05/2021 FINAL  Final   Organism ID, Bacteria PSEUDOMONAS AERUGINOSA  Final      Susceptibility   Pseudomonas aeruginosa - MIC*    CEFTAZIDIME 2 SENSITIVE Sensitive     CIPROFLOXACIN <=0.25 SENSITIVE Sensitive     GENTAMICIN 2 SENSITIVE Sensitive     IMIPENEM 2 SENSITIVE Sensitive     PIP/TAZO <=4 SENSITIVE Sensitive     CEFEPIME 2 SENSITIVE Sensitive     * RARE PSEUDOMONAS AERUGINOSA         Radiology Studies: No results found.      Scheduled Meds:  aspirin EC  81 mg Oral  Daily   atorvastatin  40 mg Oral Once per day on Mon Wed Fri Sat   cholecalciferol  2,000 Units Oral Daily   feeding supplement  1 Container Oral BID BM   finasteride  5 mg Oral Daily   furosemide  20 mg Oral BID   insulin aspart  0-5 Units Subcutaneous QHS   insulin aspart  0-9 Units Subcutaneous TID WC   insulin glargine-yfgn  15 Units Subcutaneous Daily   ipratropium-albuterol  3 mL Nebulization TID   isosorbide mononitrate  30 mg Oral Daily   melatonin  5 mg Oral QHS   mometasone-formoterol  2 puff Inhalation BID   montelukast  10 mg Oral Daily   multivitamin with minerals  1 tablet Oral Daily   Continuous Infusions:  sodium chloride 10 mL/hr at 07/05/21 1657   ceFEPime (MAXIPIME)  IV 200 mL/hr at 07/05/21 1658   metronidazole 500 mg (07/06/21 0646)     LOS: 3 days    Time spent: 44 minutes spent on chart review, discussion with nursing staff, consultants, updating family and interview/physical exam; more than 50% of that time was spent in counseling and/or coordination of care.    Jessen Siegman J [place] DO [facility] Available via Epic secure chat 7am-7pm After these hours, please refer to coverage provider listed on [email or web address] 07/06/2021, 10:58 AM

## 2021-07-07 LAB — GLUCOSE, CAPILLARY
Glucose-Capillary: 319 mg/dL — ABNORMAL HIGH (ref 70–99)
Glucose-Capillary: 427 mg/dL — ABNORMAL HIGH (ref 70–99)
Glucose-Capillary: 470 mg/dL — ABNORMAL HIGH (ref 70–99)
Glucose-Capillary: 374 mg/dL — ABNORMAL HIGH (ref 70–99)
Glucose-Capillary: 233 mg/dL — ABNORMAL HIGH (ref 70–99)

## 2021-07-07 MED ORDER — INSULIN GLARGINE-YFGN 100 UNIT/ML ~~LOC~~ SOLN
25.0000 [IU] | Freq: Every day | SUBCUTANEOUS | Status: DC
  Administered 2021-07-07: 25 [IU] via SUBCUTANEOUS
  Filled 2021-07-07: qty 0.25

## 2021-07-07 MED ORDER — INSULIN GLARGINE-YFGN 100 UNIT/ML ~~LOC~~ SOLN
35.0000 [IU] | Freq: Every day | SUBCUTANEOUS | Status: DC
  Filled 2021-07-07: qty 0.35

## 2021-07-07 MED ORDER — INSULIN ASPART 100 UNIT/ML IJ SOLN
3.0000 [IU] | Freq: Three times a day (TID) | INTRAMUSCULAR | Status: DC
  Administered 2021-07-07 (×2): 3 [IU] via SUBCUTANEOUS

## 2021-07-07 MED ORDER — INSULIN GLARGINE-YFGN 100 UNIT/ML ~~LOC~~ SOLN
10.0000 [IU] | Freq: Once | SUBCUTANEOUS | Status: AC
  Administered 2021-07-07: 10 [IU] via SUBCUTANEOUS
  Filled 2021-07-07: qty 0.1

## 2021-07-07 MED ORDER — INSULIN ASPART 100 UNIT/ML IJ SOLN
8.0000 [IU] | Freq: Three times a day (TID) | INTRAMUSCULAR | Status: DC
  Administered 2021-07-07: 8 [IU] via SUBCUTANEOUS

## 2021-07-07 MED ORDER — INSULIN ASPART 100 UNIT/ML IJ SOLN
0.0000 [IU] | Freq: Three times a day (TID) | INTRAMUSCULAR | Status: DC
  Administered 2021-07-07: 15 [IU] via SUBCUTANEOUS
  Administered 2021-07-08: 5 [IU] via SUBCUTANEOUS
  Administered 2021-07-08: 11 [IU] via SUBCUTANEOUS
  Administered 2021-07-08: 8 [IU] via SUBCUTANEOUS
  Administered 2021-07-09 (×2): 5 [IU] via SUBCUTANEOUS

## 2021-07-07 NOTE — Progress Notes (Signed)
PROGRESS NOTE    SIE FORMISANO  OZH:[id number] DOB: September 15, 1927 DOA: 07/02/2021 PCP: Lujean Amel, MD    Brief Narrative:  Clayton [name] is a 88 year old male with past medical history significant for CAD, COPD, type 2 diabetes mellitus, hyperlipidemia, hyperparathyroidism, hypertension, IDA, OSA not on CPAP, BPH, osteoporosis, chronic systolic congestive heart failure, second-degree AV block who presents to [facility] ED on 07/02/2021 from his independent living facility with complaint of diarrhea for 4 days.  Also reporting productive cough with green sputum and shortness of breath.  In the ED, temperature 97.5 F, HR 88, RR 22, BP 160/72, SPO2 78% on room air.  Sodium 138, potassium 3.2, chloride 107, CO2 19, glucose 81, BUN 87, creatinine 0.03, AST 29, ALT 18, total Ruben 0.4.  Lipase 29.  WBC 17.1, hemoglobin 8.7, platelets 420.  COVID-19 PCR negative peer influenza A/B PCR negative.  Chest x-ray with bibasilar atelectasis and small pleural effusion.  CT abdomen/pelvis with worsening left greater than right lower lobe consolidation concerning for aspiration, bronchiectasis with mucoid impaction, constipation without evidence of SBO, moderate-severe dilation of the bladder, no focal bladder wall thickening, 1 cm calcification pancreatic head increased in prominence of the proximal pancreatic duct likely indicating enlarging proximal pancreatic ductal stone, no biliary dilation.  Patient was started on ceftriaxone and metronidazole by EDP.  Hospital service consulted for further evaluation and management of acute hypoxic respiratory failure secondary to multifocal pneumonia with high suspicion of aspiration.   Assessment & Plan:   Principal Problem:   Multifocal pneumonia Active Problems:   Essential hypertension   BPH (benign prostatic hyperplasia)   Chronic kidney disease, stage IV (severe) (HCC)   CAD, multiple vessel   DM (diabetes mellitus), type 2 with renal complications (HCC)   Chronic  heart failure with preserved ejection fraction (HFpEF) (HCC)   Anemia in chronic kidney disease   Persistent atrial fibrillation (HCC)   Prolonged QT interval   Stage III pressure ulcer of sacral region ([place])   Hypokalemia   Hypoglycemia   Mild protein malnutrition (HCC)   Multifocal pneumonia, likely aspiration Patient presenting to the ED from independent living with productive cough of green sputum.  Elevated WBC count of 17.1.  CT abdomen/pelvis notable for diffuse consolidation bilateral lower lobes.  Sputum culture with Pseudomonas. --WBC 17.1>>11.2>12.3 --Augmentin 500-125mg  PO BID  Dysphagia Seen by speech therapy, currently recommending regular diet with thin liquids. --Aspiration precautions  Acute renal failure on CKD stage IV Baseline creatinine 2.4, presented with a creatinine of 3.03.  Likely etiology secondary to prerenal acidemia from dehydration versus infection as above with pneumonia. --Cr 3.03>>2.51>2.51 --Avoid nephrotoxins, renal dose all medications --BMP in a.m.  Hypokalemia: Resolved Potassium 3.2 on admission.  Repleted during hospitalization.  Chronic systolic congestive heart failure, compensated No vascular congestion noted on imaging, appears euvolemic.  Last TTE with LVEF 45-50%. --Furosemide 20 mg p.o. twice daily --Strict I's and O's and daily weights  Essential hypertension --Imdur 30 mg p.o. daily --Furosemide 20 mg p.o. twice daily  BPH: Finasteride 5 mg p.o. daily  COPD On Symbicort 160-4.5 mcg 2 puffs twice daily at home. --Continue home inhaler with hospital substitution w/ Dulera 2 puffs twice daily --Continue Singulair 10 mg p.o. nightly  CAD --Aspirin --Atorvastatin  Type 2 diabetes mellitus Home regimen includes Lantus 22 units subcutaneously daily, NovoLog 8u with breakfast/lunch, and 9 units with dinner.  Hemoglobin A1c 7.9 on 06/27/2021. --Semglee 25 units Datil daily --Novolog 3u TIDAC --Sensitive SSI for coverage --CBGs  qAC/HS  Persistent atrial fibrillation Follows with cardiology outpatient, Dr. Ellyn Hack.  Currently rate controlled on no medications.  Not on anticoagulation due to underlying anemia of chronic disease with likely slow GI bleed.  Iron deficiency anemia Follows with medical oncology outpatient, receives occasional IV iron infusions. --Hgb 8.3; stable  Prolonged QTc interval --Avoid QT prolonging medications --Maintain magnesium > 2.0 and potassium > 4.0  Stage III pressure ulcer sacrum, POA --Continue local wound care, preventative measures, frequent offloading  Mild protein calorie malnutrition Body mass index is 27.52 kg/m. Nutrition Status: Nutrition Problem: Inadequate oral intake Etiology: acute illness, decreased appetite, diarrhea Signs/Symptoms: per patient/family report Interventions: MVI, Boost Breeze -- Dietitian following, continue to encourage increase oral intake  Weakness/abilities/deconditioning: --PT/OT recommending SNF, TOC for placement --Continue therapy efforts while inpatient   DVT prophylaxis: SCDs Start: 07/03/21 0750   Code Status: DNR Family Communication: No family present at bedside this morning  Disposition Plan:  Level of care: Progressive Status is: Inpatient  Remains inpatient appropriate because: pending SNF placement    Consultants:  None  Procedures:  None  Antimicrobials:  Cefepime 1/18>> Metronidazole 1/16>> Ceftriaxone 1/16 - 1/18     Subjective: Patient seen examined bedside, resting comfortably.  Reports back pain.  Refusing to set up for breathing treatment this morning.  RT present at bedside.  No other specific complaints or concerns at this time.  Denies headache, no chest pain, palpitations, no shortness of breath, no fever/chills/night sweats, no nausea/vomiting/diarrhea, no abdominal pain.  No acute events overnight per nursing staff.  Objective: Vitals:   07/06/21 2246 07/07/21 0619 07/07/21 0817 07/07/21 1034   BP: (!) 143/52 (!) 130/56  (!) 137/55  Pulse: 96 84  85  Resp: 18 20    Temp: 97.8 F (36.6 C) 97.6 F (36.4 C)  (!) 97.5 F (36.4 C)  TempSrc: Oral Oral  Oral  SpO2: 94% 95% 94% 100%  Weight:      Height:        Intake/Output Summary (Last 24 hours) at 07/07/2021 1045 Last data filed at 07/06/2021 1509 Gross per 24 hour  Intake 240 ml  Output 1100 ml  Net -860 ml   Filed Weights   07/03/21 1111  Weight: 87 kg    Examination:  General exam: Appears calm and comfortable, chronically ill/elderly in appearance Respiratory system: Breath sounds slightly decreased bilateral bases, coarse, no wheezing/crackles, normal respiratory effort without accessory muscle use, on room air with SPO2 95% at rest Cardiovascular system: S1 & S2 heard, RRR. No JVD, murmurs, rubs, gallops or clicks. No pedal edema. Gastrointestinal system: Abdomen is nondistended, soft and nontender. No organomegaly or masses felt. Normal bowel sounds heard. Central nervous system: Alert. No focal neurological deficits. Extremities: Symmetric 5 x 5 power. Skin: No rashes, lesions or ulcers Psychiatry: Judgement and insight appear poor. Mood & affect appropriate.     Data Reviewed: I have personally reviewed following labs and imaging studies  CBC: Recent Labs  Lab 07/02/21 2246 07/04/21 0346 07/05/21 0345 07/06/21 0324  WBC 17.1* 10.1 11.2* 12.3*  NEUTROABS 13.8*  --  8.0*  --   HGB 8.7* 7.3* 7.7* 8.3*  HCT 28.9* 24.9* 25.6* 26.8*  MCV 87.0 87.7 87.1 85.9  PLT 420* 333 351 109   Basic Metabolic Panel: Recent Labs  Lab 07/02/21 2246 07/04/21 0346 07/05/21 0345 07/06/21 0324  NA 138 137 137 137  K 3.2* 3.4* 4.5 4.6  CL 107 109 111 110  CO2 19* 19* 19* 17*  GLUCOSE 81 159* 224* 336*  BUN 87* 84* 80* 70*  CREATININE 3.03* 2.77* 2.51* 2.51*  CALCIUM 8.2* 7.6* 8.0* 8.1*  MG  --   --  1.9 1.8  PHOS  --   --  4.6  --    GFR: Estimated Creatinine Clearance: 19.8 mL/min (A) (by C-G formula  based on SCr of 2.51 mg/dL (H)). Liver Function Tests: Recent Labs  Lab 07/02/21 2246 07/04/21 0346 07/05/21 0345  AST 29 20 16   ALT 18 15 14   ALKPHOS 85 71 73  BILITOT 0.4 0.6 0.5  PROT 7.1 5.6* 5.8*  ALBUMIN 3.1* 2.4* 2.5*   Recent Labs  Lab 07/02/21 2246  LIPASE 29   No results for input(s): AMMONIA in the last 168 hours. Coagulation Profile: No results for input(s): INR, PROTIME in the last 168 hours. Cardiac Enzymes: No results for input(s): CKTOTAL, CKMB, CKMBINDEX, TROPONINI in the last 168 hours. BNP (last 3 results) No results for input(s): PROBNP in the last 8760 hours. HbA1C: No results for input(s): HGBA1C in the last 72 hours. CBG: Recent Labs  Lab 07/06/21 1637 07/06/21 1753 07/06/21 2246 07/06/21 2249 07/07/21 0758  GLUCAP 290* 257* 401* 398* 319*   Lipid Profile: No results for input(s): CHOL, HDL, LDLCALC, TRIG, CHOLHDL, LDLDIRECT in the last 72 hours. Thyroid Function Tests: No results for input(s): TSH, T4TOTAL, FREET4, T3FREE, THYROIDAB in the last 72 hours. Anemia Panel: No results for input(s): VITAMINB12, FOLATE, FERRITIN, TIBC, IRON, RETICCTPCT in the last 72 hours. Sepsis Labs: Recent Labs  Lab 07/05/21 0345  PROCALCITON 0.16    Recent Results (from the past 240 hour(s))  Resp Panel by RT-PCR (Flu A&B, Covid) Nasopharyngeal Swab     Status: None   Collection Time: 07/02/21 10:40 PM   Specimen: Nasopharyngeal Swab; Nasopharyngeal(NP) swabs in vial transport medium  Result Value Ref Range Status   SARS Coronavirus 2 by RT PCR NEGATIVE NEGATIVE Final    Comment: (NOTE) SARS-CoV-2 target nucleic acids are NOT DETECTED.  The SARS-CoV-2 RNA is generally detectable in upper respiratory specimens during the acute phase of infection. The lowest concentration of SARS-CoV-2 viral copies this assay can detect is 138 copies/mL. A negative result does not preclude SARS-Cov-2 infection and should not be used as the sole basis for treatment  or other patient management decisions. A negative result may occur with  improper specimen collection/handling, submission of specimen other than nasopharyngeal swab, presence of viral mutation(s) within the areas targeted by this assay, and inadequate number of viral copies(<138 copies/mL). A negative result must be combined with clinical observations, patient history, and epidemiological information. The expected result is Negative.  Fact Sheet for Patients:  EntrepreneurPulse.com.au  Fact Sheet for Healthcare Providers:  IncredibleEmployment.be  This test is no t yet approved or cleared by the [place] FDA and  has been authorized for detection and/or diagnosis of SARS-CoV-2 by FDA under an Emergency Use Authorization (EUA). This EUA will remain  in effect (meaning this test can be used) for the duration of the COVID-19 declaration under Section 564(b)(1) of the Act, 21 U.S.C.section 360bbb-3(b)(1), unless the authorization is terminated  or revoked sooner.       Influenza A by PCR NEGATIVE NEGATIVE Final   Influenza B by PCR NEGATIVE NEGATIVE Final    Comment: (NOTE) The Xpert Xpress SARS-CoV-2/FLU/RSV plus assay is intended as an aid in the diagnosis of influenza from Nasopharyngeal swab specimens and should not be used as a sole basis for treatment. Nasal washings  and aspirates are unacceptable for Xpert Xpress SARS-CoV-2/FLU/RSV testing.  Fact Sheet for Patients: EntrepreneurPulse.com.au  Fact Sheet for Healthcare Providers: IncredibleEmployment.be  This test is not yet approved or cleared by the [place] FDA and has been authorized for detection and/or diagnosis of SARS-CoV-2 by FDA under an Emergency Use Authorization (EUA). This EUA will remain in effect (meaning this test can be used) for the duration of the COVID-19 declaration under Section 564(b)(1) of the Act, 21 U.S.C. section  360bbb-3(b)(1), unless the authorization is terminated or revoked.  Performed at [facility], [place] [address], [place], [place] [address]   Expectorated Sputum Assessment w Gram Stain, Rflx to Resp Cult     Status: None   Collection Time: 07/03/21 12:25 PM   Specimen: Sputum  Result Value Ref Range Status   Specimen Description SPUTUM  Final   Special Requests NONE  Final   Sputum evaluation   Final    THIS SPECIMEN IS ACCEPTABLE FOR SPUTUM CULTURE Performed at [facility], [place] [address], [place], [place] [address]    Report Status 07/03/2021 FINAL  Final  Culture, Respiratory w Gram Stain     Status: None   Collection Time: 07/03/21 12:25 PM   Specimen: SPU  Result Value Ref Range Status   Specimen Description   Final    SPUTUM Performed at [place] [address], [place], [place] [address]    Special Requests   Final    NONE Reflexed from [id number] Performed at [facility], [place] [address], [place], [place] [address]    Gram Stain   Final    NO SQUAMOUS EPITHELIAL CELLS SEEN FEW WBC SEEN FEW GRAM POSITIVE COCCI    Culture   Final    RARE PSEUDOMONAS AERUGINOSA NO STAPHYLOCOCCUS AUREUS ISOLATED Performed at [facility], [address], [place], [place] [address]    Report Status 07/05/2021 FINAL  Final   Organism ID, Bacteria PSEUDOMONAS AERUGINOSA  Final      Susceptibility   Pseudomonas aeruginosa - MIC*    CEFTAZIDIME 2 SENSITIVE Sensitive     CIPROFLOXACIN <=0.25 SENSITIVE Sensitive     GENTAMICIN 2 SENSITIVE Sensitive     IMIPENEM 2 SENSITIVE Sensitive     PIP/TAZO <=4 SENSITIVE Sensitive     CEFEPIME 2 SENSITIVE Sensitive     * RARE PSEUDOMONAS AERUGINOSA         Radiology Studies: No results found.      Scheduled Meds:  amoxicillin-clavulanate  1 tablet Oral BID   aspirin EC  81 mg Oral Daily   atorvastatin  40 mg Oral Once per day on Mon Wed Fri Sat    cholecalciferol  2,000 Units Oral Daily   feeding supplement  1 Container Oral BID BM   finasteride  5 mg Oral Daily   furosemide  20 mg Oral BID   insulin aspart  0-5 Units Subcutaneous QHS   insulin aspart  0-9 Units Subcutaneous TID WC   insulin aspart  3 Units Subcutaneous TID WC   insulin glargine-yfgn  25 Units Subcutaneous Daily   ipratropium-albuterol  3 mL Nebulization TID   isosorbide mononitrate  30 mg Oral Daily   melatonin  5 mg Oral QHS   mometasone-formoterol  2 puff Inhalation BID   montelukast  10 mg Oral Daily   multivitamin with minerals  1 tablet Oral Daily   Continuous Infusions:  sodium chloride Stopped (07/06/21 1339)  LOS: 4 days    Time spent: 41 minutes spent on chart review, discussion with nursing staff, consultants, updating family and interview/physical exam; more than 50% of that time was spent in counseling and/or coordination of care.    Dmonte Maher J [place] DO [facility] Available via Epic secure chat 7am-7pm After these hours, please refer to coverage provider listed on [email or web address] 07/07/2021, 10:45 AM

## 2021-07-07 NOTE — Progress Notes (Signed)
End of shift  Pt received miralax today.  Hasn't had a BM since admission.  He did come in with diarrhea.  Pt has active bowel sounds.  BG have been high, Doctor adjusted his insulin.  Pt family said he gets 25 units daily and 8 with meals.

## 2021-07-07 NOTE — TOC Progression Note (Addendum)
Transition of Care [facility] - Progression Note    Patient Details  Name: Clayton [name] MRN: [id number] Date of Birth: May 21, 1928  [facility] CM/SW Contact  Leeroy Cha, RN Phone Number: 07/07/2021, 12:51 PM  Clinical Narrative:    Family has chosen [facility] in [place].  TCT-Tracey/pt came come will start auth for bed at [facility]. TCT-Health team advantage wcb with auth number.  Expected Discharge Plan: [place] Barriers to Discharge: Continued Medical Work up  Expected Discharge Plan and Services Expected Discharge Plan: [place]   Discharge Planning Services: CM Consult Post Acute Care Choice: [place] Living arrangements for the past 2 months: Single Family Home                                       Social Determinants of Health (SDOH) Interventions    Readmission Risk Interventions No flowsheet data found.

## 2021-07-07 NOTE — Progress Notes (Signed)
Physical Therapy Treatment Patient Details Name: Clayton [name] MRN: [id number] DOB: 30-Jun-1927 Today's Date: 07/07/2021   History of Present Illness Patient is a 88 year old male diarrhea for the past 4 days. He also has been coughing greenish sputum and felt short of breath. Work up revealed multifocal pneumonia with concern for aspiration. PMH includes BPH, CAD, COPD, lumbar DDD, type II DM, hyperlipidemia, hyperparathyroidism, hypertension, iron deficiency anemia, OSA not on CPAP, osteomyelitis, osteoporosis, chronic systolic CHF, second-degree AV block    PT Comments    Patient required encouragement to participate in therapy at start of session but agreeable. He continues to require Max +2 assist with use of pad to pivot to EOB; pt able to maintain seated balance with bil UE support at EOB with Bil UE support. Sit<>Stand completed with EVA walker in hope to progress to ambulation however pt too weak and unable to obtain upright posture with platform walker. Standing balance improved with Denna Haggard and pt completed stand from EOB and paddles with stedy. Returned to bed and repositioned due to fatigue. Acute PT will continue to progress pt as able, continue to recommend SNF rehab.    Recommendations for follow up therapy are one component of a multi-disciplinary discharge planning process, led by the attending physician.  Recommendations may be updated based on patient status, additional functional criteria and insurance authorization.  Follow Up Recommendations  Skilled nursing-short term rehab (<3 hours/day)     Assistance Recommended at Discharge Frequent or constant Supervision/Assistance  Patient can return home with the following     Equipment Recommendations  None recommended by PT    Recommendations for Other Services       Precautions / Restrictions Precautions Precautions: Fall Restrictions Weight Bearing Restrictions: No     Mobility  Bed Mobility Overal bed  mobility: Needs Assistance Bed Mobility: Supine to Sit, Sit to Supine     Supine to sit: Max assist, +2 for physical assistance, +2 for safety/equipment, HOB elevated Sit to supine: Max assist, +2 for physical assistance, +2 for safety/equipment, HOB elevated   General bed mobility comments: Pt initatied walking bil LE's towards EOB but minimal progress to move LE's off EOB.    Transfers Overall transfer level: Needs assistance Equipment used: Bilateral platform walker Transfers: Sit to/from Stand Sit to Stand: Mod assist, +2 physical assistance, +2 safety/equipment, From elevated surface           General transfer comment: Mod+2 to stand from EOB with EVA walker, pt unablet o achieve upright posture and tuck hips. pt with improved stand and posture using Denna Haggard. Pt stood additional time from [facility] paddles but fatigued and requesting back to bed. Transfer via Lift Equipment: Stedy  Ambulation/Gait                   Stairs             Wheelchair Mobility    Modified Rankin (Stroke Patients Only)       Balance Overall balance assessment: Needs assistance, History of Falls Sitting-balance support: Feet supported, Bilateral upper extremity supported Sitting balance-Leahy Scale: Fair     Standing balance support: During functional activity, Reliant on assistive device for balance, Bilateral upper extremity supported Standing balance-Leahy Scale: Poor                              Cognition Arousal/Alertness: Awake/alert Behavior During Therapy: WFL for tasks assessed/performed Overall Cognitive  Status: History of cognitive impairments - at baseline                                          Exercises      General Comments        Pertinent Vitals/Pain Pain Assessment Pain Assessment: Faces Faces Pain Scale: Hurts a little bit Pain Location: back with bed mobility Pain Descriptors / Indicators: Moaning, Grimacing,  Guarding Pain Intervention(s): Limited activity within patient's tolerance, Monitored during session, Repositioned    Home Living Family/patient expects to be discharged to:: Assisted living                 Home Equipment: Rollator (4 wheels);Shower seat;BSC/3in1;Wheelchair - manual      Prior Function            PT Goals (current goals can now be found in the care plan section) Acute Rehab PT Goals Patient Stated Goal: get walking again PT Goal Formulation: With family Time For Goal Achievement: 07/19/21 Potential to Achieve Goals: Fair    Frequency    Min 2X/week      PT Plan      Co-evaluation              AM-PAC PT "6 Clicks" Mobility   Outcome Measure  Help needed turning from your back to your side while in a flat bed without using bedrails?: Total Help needed moving from lying on your back to sitting on the side of a flat bed without using bedrails?: Total Help needed moving to and from a bed to a chair (including a wheelchair)?: Total Help needed standing up from a chair using your arms (e.g., wheelchair or bedside chair)?: Total Help needed to walk in hospital room?: Total Help needed climbing 3-5 steps with a railing? : Total 6 Click Score: 6    End of Session Equipment Utilized During Treatment: Gait belt Activity Tolerance: Patient tolerated treatment well Patient left: in bed;with call bell/phone within reach;with bed alarm set Nurse Communication: Mobility status PT Visit Diagnosis: Muscle weakness (generalized) (M62.81);Other abnormalities of gait and mobility (R26.89);Difficulty in walking, not elsewhere classified (R26.2)     Time: 6384-5364 PT Time Calculation (min) (ACUTE ONLY): 24 min  Charges:  $Therapeutic Activity: 23-37 mins                     Verner Mould, DPT Acute Rehabilitation Services Office [phone] Pager [phone]    Clayton [name] 07/07/2021, 4:39 PM

## 2021-07-08 LAB — GLUCOSE, CAPILLARY
Glucose-Capillary: 230 mg/dL — ABNORMAL HIGH (ref 70–99)
Glucose-Capillary: 311 mg/dL — ABNORMAL HIGH (ref 70–99)
Glucose-Capillary: 251 mg/dL — ABNORMAL HIGH (ref 70–99)
Glucose-Capillary: 280 mg/dL — ABNORMAL HIGH (ref 70–99)
Glucose-Capillary: 208 mg/dL — ABNORMAL HIGH (ref 70–99)

## 2021-07-08 MED ORDER — POLYETHYLENE GLYCOL 3350 17 G PO PACK
17.0000 g | PACK | Freq: Every day | ORAL | Status: DC
  Administered 2021-07-09: 17 g via ORAL
  Filled 2021-07-08: qty 1

## 2021-07-08 MED ORDER — SORBITOL 70 % SOLN
960.0000 mL | TOPICAL_OIL | Freq: Once | ORAL | Status: AC
  Administered 2021-07-08: 960 mL via RECTAL
  Filled 2021-07-08 (×2): qty 473

## 2021-07-08 MED ORDER — INSULIN ASPART 100 UNIT/ML IJ SOLN
10.0000 [IU] | Freq: Three times a day (TID) | INTRAMUSCULAR | Status: DC
  Administered 2021-07-08 – 2021-07-09 (×5): 10 [IU] via SUBCUTANEOUS

## 2021-07-08 MED ORDER — SENNOSIDES-DOCUSATE SODIUM 8.6-50 MG PO TABS
1.0000 | ORAL_TABLET | Freq: Two times a day (BID) | ORAL | Status: DC
  Administered 2021-07-08: 1 via ORAL
  Filled 2021-07-08: qty 1

## 2021-07-08 MED ORDER — INSULIN GLARGINE-YFGN 100 UNIT/ML ~~LOC~~ SOLN
45.0000 [IU] | Freq: Every day | SUBCUTANEOUS | Status: DC
  Administered 2021-07-08 – 2021-07-09 (×2): 45 [IU] via SUBCUTANEOUS
  Filled 2021-07-08 (×2): qty 0.45

## 2021-07-08 MED ORDER — IPRATROPIUM-ALBUTEROL 0.5-2.5 (3) MG/3ML IN SOLN
3.0000 mL | Freq: Two times a day (BID) | RESPIRATORY_TRACT | Status: DC
  Administered 2021-07-08 – 2021-07-09 (×2): 3 mL via RESPIRATORY_TRACT
  Filled 2021-07-08 (×2): qty 3

## 2021-07-08 MED ORDER — SENNOSIDES-DOCUSATE SODIUM 8.6-50 MG PO TABS
2.0000 | ORAL_TABLET | Freq: Two times a day (BID) | ORAL | Status: DC
  Administered 2021-07-08 – 2021-07-09 (×2): 2 via ORAL
  Filled 2021-07-08 (×2): qty 2

## 2021-07-08 NOTE — TOC Progression Note (Addendum)
Transition of Care [facility]) - Progression Note    Patient Details  Name: Clayton [name] MRN: [id number] Date of Birth: 1927/08/28  [facility] CM/SW Contact  Leeroy Cha, RN Phone Number: 07/08/2021, 2:16 PM  Clinical Narrative:    Tcf-Stephanie at [facility]-ambulance approval is [phone]  snf approval is [id number] Tct-[facility] Harden/patient can come on [id number]. Md notified  Expected Discharge Plan: [place] Barriers to Discharge: Continued Medical Work up  Expected Discharge Plan and Services Expected Discharge Plan: [place]   Discharge Planning Services: CM Consult Post Acute Care Choice: [place] arrangements for the past 2 months: Single Family Home                                       Social Determinants of Health (SDOH) Interventions    Readmission Risk Interventions No flowsheet data found.

## 2021-07-08 NOTE — Progress Notes (Signed)
PROGRESS NOTE    Clayton [name]  GUY:[id number] DOB: 07-12-1927 DOA: 07/02/2021 PCP: Lujean Amel, MD    Brief Narrative:  Clayton [name] is a 88 year old male with past medical history significant for CAD, COPD, type 2 diabetes mellitus, hyperlipidemia, hyperparathyroidism, hypertension, IDA, OSA not on CPAP, BPH, osteoporosis, chronic systolic congestive heart failure, second-degree AV block who presents to [facility] ED on 07/02/2021 from his independent living facility with complaint of diarrhea for 4 days.  Also reporting productive cough with green sputum and shortness of breath.  In the ED, temperature 97.5 F, HR 88, RR 22, BP 160/72, SPO2 78% on room air.  Sodium 138, potassium 3.2, chloride 107, CO2 19, glucose 81, BUN 87, creatinine 0.03, AST 29, ALT 18, total Ruben 0.4.  Lipase 29.  WBC 17.1, hemoglobin 8.7, platelets 420.  COVID-19 PCR negative peer influenza A/B PCR negative.  Chest x-ray with bibasilar atelectasis and small pleural effusion.  CT abdomen/pelvis with worsening left greater than right lower lobe consolidation concerning for aspiration, bronchiectasis with mucoid impaction, constipation without evidence of SBO, moderate-severe dilation of the bladder, no focal bladder wall thickening, 1 cm calcification pancreatic head increased in prominence of the proximal pancreatic duct likely indicating enlarging proximal pancreatic ductal stone, no biliary dilation.  Patient was started on ceftriaxone and metronidazole by EDP.  Hospital service consulted for further evaluation and management of acute hypoxic respiratory failure secondary to multifocal pneumonia with high suspicion of aspiration.   Assessment & Plan:   Principal Problem:   Multifocal pneumonia Active Problems:   Essential hypertension   BPH (benign prostatic hyperplasia)   Chronic kidney disease, stage IV (severe) (HCC)   CAD, multiple vessel   DM (diabetes mellitus), type 2 with renal complications (HCC)   Chronic  heart failure with preserved ejection fraction (HFpEF) (HCC)   Anemia in chronic kidney disease   Persistent atrial fibrillation (HCC)   Prolonged QT interval   Stage III pressure ulcer of sacral region ([place])   Hypokalemia   Hypoglycemia   Mild protein malnutrition (HCC)   Multifocal pneumonia, likely aspiration Patient presenting to the ED from independent living with productive cough of green sputum.  Elevated WBC count of 17.1.  CT abdomen/pelvis notable for diffuse consolidation bilateral lower lobes.  Sputum culture with Pseudomonas. --WBC 17.1>>11.2>12.3 --Augmentin 500-125mg  PO BID  Dysphagia Seen by speech therapy, currently recommending regular diet with thin liquids. --Aspiration precautions  Acute renal failure on CKD stage IV Baseline creatinine 2.4, presented with a creatinine of 3.03.  Likely etiology secondary to prerenal acidemia from dehydration versus infection as above with pneumonia. --Cr 3.03>>2.51>2.51 --Avoid nephrotoxins, renal dose all medications --BMP in a.m.  Hypokalemia: Resolved Potassium 3.2 on admission.  Repleted during hospitalization.  Chronic systolic congestive heart failure, compensated No vascular congestion noted on imaging, appears euvolemic.  Last TTE with LVEF 45-50%. --Furosemide 20 mg p.o. twice daily --Strict I's and O's and daily weights  Essential hypertension --Imdur 30 mg p.o. daily --Furosemide 20 mg p.o. twice daily  BPH: Finasteride 5 mg p.o. daily  COPD On Symbicort 160-4.5 mcg 2 puffs twice daily at home. --Continue home inhaler with hospital substitution w/ Dulera 2 puffs twice daily --Continue Singulair 10 mg p.o. nightly  CAD --Aspirin --Atorvastatin  Type 2 diabetes mellitus Home regimen includes Lantus 22 units subcutaneously daily, NovoLog 8u with breakfast/lunch, and 9 units with dinner.  Hemoglobin A1c 7.9 on 06/27/2021. --Semglee 45 units [place] daily --Novolog 10u TIDAC --Moderate SSI for coverage --CBGs  qAC/HS  Persistent atrial fibrillation Follows with cardiology outpatient, Dr. Ellyn Hack.  Currently rate controlled on no medications.  Not on anticoagulation due to underlying anemia of chronic disease with likely slow GI bleed.  Iron deficiency anemia Follows with medical oncology outpatient, receives occasional IV iron infusions. --Hgb 8.3; stable  Prolonged QTc interval --Avoid QT prolonging medications --Maintain magnesium > 2.0 and potassium > 4.0  Stage III pressure ulcer sacrum, POA --Continue local wound care, preventative measures, frequent offloading  Mild protein calorie malnutrition Body mass index is 27.52 kg/m. Nutrition Status: Nutrition Problem: Inadequate oral intake Etiology: acute illness, decreased appetite, diarrhea Signs/Symptoms: per patient/family report Interventions: MVI, Boost Breeze -- Dietitian following, continue to encourage increase oral intake  Constipation: --Senokot 2 tablets p.o. twice daily --MiraLAX daily  Weakness/abilities/deconditioning: --PT/OT recommending SNF, TOC for placement --Continue therapy efforts while inpatient   DVT prophylaxis: SCDs Start: 07/03/21 0750   Code Status: DNR Family Communication: No family present at bedside this morning  Disposition Plan:  Level of care: Med-Surg Status is: Inpatient  Remains inpatient appropriate because: Pending insurance authorization for collapse present [facility], anticipate discharge on Monday    Consultants:  None  Procedures:  None  Antimicrobials:  Cefepime 1/18>> Metronidazole 1/16>> Ceftriaxone 1/16 - 1/18     Subjective: Patient seen examined bedside, resting comfortably.  No specific complaints this morning.  Adjusting insulin for hyperglycemia.  Denies headache, no chest pain, palpitations, no shortness of breath, no fever/chills/night sweats, no nausea/vomiting/diarrhea, no abdominal pain.  No acute events overnight per nursing  staff.  Objective: Vitals:   07/08/21 0624 07/08/21 0810 07/08/21 0813 07/08/21 1144  BP: (!) 131/44   (!) 137/54  Pulse: 85   90  Resp: 18   (!) 21  Temp: 97.8 F (36.6 C)   97.7 F (36.5 C)  TempSrc: Oral   Oral  SpO2: 96% 96% 96% 97%  Weight:      Height:        Intake/Output Summary (Last 24 hours) at 07/08/2021 1207 Last data filed at 07/08/2021 0902 Gross per 24 hour  Intake 600 ml  Output 1250 ml  Net -650 ml   Filed Weights   07/03/21 1111  Weight: 87 kg    Examination:  General exam: Appears calm and comfortable, chronically ill/elderly in appearance Respiratory system: Breath sounds slightly decreased bilateral bases, coarse, no wheezing/crackles, normal respiratory effort without accessory muscle use, on room air with SPO2 96% at rest Cardiovascular system: S1 & S2 heard, RRR. No JVD, murmurs, rubs, gallops or clicks. No pedal edema. Gastrointestinal system: Abdomen is nondistended, soft and nontender. No organomegaly or masses felt. Normal bowel sounds heard. Central nervous system: Alert. No focal neurological deficits. Extremities: Symmetric 5 x 5 power. Skin: No rashes, lesions or ulcers Psychiatry: Judgement and insight appear poor. Mood & affect appropriate.     Data Reviewed: I have personally reviewed following labs and imaging studies  CBC: Recent Labs  Lab 07/02/21 2246 07/04/21 0346 07/05/21 0345 07/06/21 0324  WBC 17.1* 10.1 11.2* 12.3*  NEUTROABS 13.8*  --  8.0*  --   HGB 8.7* 7.3* 7.7* 8.3*  HCT 28.9* 24.9* 25.6* 26.8*  MCV 87.0 87.7 87.1 85.9  PLT 420* 333 351 616   Basic Metabolic Panel: Recent Labs  Lab 07/02/21 2246 07/04/21 0346 07/05/21 0345 07/06/21 0324  NA 138 137 137 137  K 3.2* 3.4* 4.5 4.6  CL 107 109 111 110  CO2 19* 19* 19* 17*  GLUCOSE 81  159* 224* 336*  BUN 87* 84* 80* 70*  CREATININE 3.03* 2.77* 2.51* 2.51*  CALCIUM 8.2* 7.6* 8.0* 8.1*  MG  --   --  1.9 1.8  PHOS  --   --  4.6  --    GFR: Estimated  Creatinine Clearance: 19.8 mL/min (A) (by C-G formula based on SCr of 2.51 mg/dL (H)). Liver Function Tests: Recent Labs  Lab 07/02/21 2246 07/04/21 0346 07/05/21 0345  AST 29 20 16   ALT 18 15 14   ALKPHOS 85 71 73  BILITOT 0.4 0.6 0.5  PROT 7.1 5.6* 5.8*  ALBUMIN 3.1* 2.4* 2.5*   Recent Labs  Lab 07/02/21 2246  LIPASE 29   No results for input(s): AMMONIA in the last 168 hours. Coagulation Profile: No results for input(s): INR, PROTIME in the last 168 hours. Cardiac Enzymes: No results for input(s): CKTOTAL, CKMB, CKMBINDEX, TROPONINI in the last 168 hours. BNP (last 3 results) No results for input(s): PROBNP in the last 8760 hours. HbA1C: No results for input(s): HGBA1C in the last 72 hours. CBG: Recent Labs  Lab 07/07/21 1350 07/07/21 1650 07/07/21 2047 07/08/21 0739 07/08/21 1140  GLUCAP 470* 374* 233* 230* 311*   Lipid Profile: No results for input(s): CHOL, HDL, LDLCALC, TRIG, CHOLHDL, LDLDIRECT in the last 72 hours. Thyroid Function Tests: No results for input(s): TSH, T4TOTAL, FREET4, T3FREE, THYROIDAB in the last 72 hours. Anemia Panel: No results for input(s): VITAMINB12, FOLATE, FERRITIN, TIBC, IRON, RETICCTPCT in the last 72 hours. Sepsis Labs: Recent Labs  Lab 07/05/21 0345  PROCALCITON 0.16    Recent Results (from the past 240 hour(s))  Resp Panel by RT-PCR (Flu A&B, Covid) Nasopharyngeal Swab     Status: None   Collection Time: 07/02/21 10:40 PM   Specimen: Nasopharyngeal Swab; Nasopharyngeal(NP) swabs in vial transport medium  Result Value Ref Range Status   SARS Coronavirus 2 by RT PCR NEGATIVE NEGATIVE Final    Comment: (NOTE) SARS-CoV-2 target nucleic acids are NOT DETECTED.  The SARS-CoV-2 RNA is generally detectable in upper respiratory specimens during the acute phase of infection. The lowest concentration of SARS-CoV-2 viral copies this assay can detect is 138 copies/mL. A negative result does not preclude SARS-Cov-2 infection and  should not be used as the sole basis for treatment or other patient management decisions. A negative result may occur with  improper specimen collection/handling, submission of specimen other than nasopharyngeal swab, presence of viral mutation(s) within the areas targeted by this assay, and inadequate number of viral copies(<138 copies/mL). A negative result must be combined with clinical observations, patient history, and epidemiological information. The expected result is Negative.  Fact Sheet for Patients:  EntrepreneurPulse.com.au  Fact Sheet for Healthcare Providers:  IncredibleEmployment.be  This test is no t yet approved or cleared by the [place] FDA and  has been authorized for detection and/or diagnosis of SARS-CoV-2 by FDA under an Emergency Use Authorization (EUA). This EUA will remain  in effect (meaning this test can be used) for the duration of the COVID-19 declaration under Section 564(b)(1) of the Act, 21 U.S.C.section 360bbb-3(b)(1), unless the authorization is terminated  or revoked sooner.       Influenza A by PCR NEGATIVE NEGATIVE Final   Influenza B by PCR NEGATIVE NEGATIVE Final    Comment: (NOTE) The Xpert Xpress SARS-CoV-2/FLU/RSV plus assay is intended as an aid in the diagnosis of influenza from Nasopharyngeal swab specimens and should not be used as a sole basis for treatment. Nasal washings and aspirates  are unacceptable for Xpert Xpress SARS-CoV-2/FLU/RSV testing.  Fact Sheet for Patients: EntrepreneurPulse.com.au  Fact Sheet for Healthcare Providers: IncredibleEmployment.be  This test is not yet approved or cleared by the [place] FDA and has been authorized for detection and/or diagnosis of SARS-CoV-2 by FDA under an Emergency Use Authorization (EUA). This EUA will remain in effect (meaning this test can be used) for the duration of the COVID-19 declaration  under Section 564(b)(1) of the Act, 21 U.S.C. section 360bbb-3(b)(1), unless the authorization is terminated or revoked.  Performed at [facility], [place] [address], [place], [place] [address]   Expectorated Sputum Assessment w Gram Stain, Rflx to Resp Cult     Status: None   Collection Time: 07/03/21 12:25 PM   Specimen: Sputum  Result Value Ref Range Status   Specimen Description SPUTUM  Final   Special Requests NONE  Final   Sputum evaluation   Final    THIS SPECIMEN IS ACCEPTABLE FOR SPUTUM CULTURE Performed at [facility], [place] [address] [place], [place] [address]    Report Status 07/03/2021 FINAL  Final  Culture, Respiratory w Gram Stain     Status: None   Collection Time: 07/03/21 12:25 PM   Specimen: SPU  Result Value Ref Range Status   Specimen Description   Final    SPUTUM Performed at [facility] [address], [place],  [address]    Special Requests   Final    NONE Reflexed from [id number] Performed at [facility], [place] [address], [place], [place] [address]    Gram Stain   Final    NO SQUAMOUS EPITHELIAL CELLS SEEN FEW WBC SEEN FEW GRAM POSITIVE COCCI    Culture   Final    RARE PSEUDOMONAS AERUGINOSA NO STAPHYLOCOCCUS AUREUS ISOLATED Performed at [facility], [address] [place],  [address]    Report Status 07/05/2021 FINAL  Final   Organism ID, Bacteria PSEUDOMONAS AERUGINOSA  Final      Susceptibility   Pseudomonas aeruginosa - MIC*    CEFTAZIDIME 2 SENSITIVE Sensitive     CIPROFLOXACIN <=0.25 SENSITIVE Sensitive     GENTAMICIN 2 SENSITIVE Sensitive     IMIPENEM 2 SENSITIVE Sensitive     PIP/TAZO <=4 SENSITIVE Sensitive     CEFEPIME 2 SENSITIVE Sensitive     * RARE PSEUDOMONAS AERUGINOSA         Radiology Studies: No results found.      Scheduled Meds:  amoxicillin-clavulanate  1 tablet Oral BID   aspirin EC  81 mg Oral Daily    atorvastatin  40 mg Oral Once per day on Mon Wed Fri Sat   cholecalciferol  2,000 Units Oral Daily   feeding supplement  1 Container Oral BID BM   finasteride  5 mg Oral Daily   furosemide  20 mg Oral BID   insulin aspart  0-15 Units Subcutaneous TID WC   insulin aspart  0-5 Units Subcutaneous QHS   insulin aspart  10 Units Subcutaneous TID WC   insulin glargine-yfgn  45 Units Subcutaneous Daily   ipratropium-albuterol  3 mL Nebulization BID   isosorbide mononitrate  30 mg Oral Daily   melatonin  5 mg Oral QHS   mometasone-formoterol  2 puff Inhalation BID   montelukast  10 mg Oral Daily   multivitamin with minerals  1 tablet Oral Daily   [START ON 07/09/2021] polyethylene glycol  17 g Oral Daily   senna-docusate  2 tablet Oral BID   Continuous Infusions:  sodium chloride Stopped (07/06/21 1339)     LOS: 5 days    Time spent: 40 minutes spent on chart review, discussion with nursing staff, consultants, updating family and interview/physical exam; more than 50% of that time was spent in counseling and/or coordination of care.    Dajae Kizer J [place] DO [facility] Available via Epic secure chat 7am-7pm After these hours, please refer to coverage provider listed on [email or web address] 07/08/2021, 12:07 PM

## 2021-07-09 LAB — BASIC METABOLIC PANEL
Anion gap: 9 (ref 5–15)
BUN: 82 mg/dL — ABNORMAL HIGH (ref 8–23)
CO2: 18 mmol/L — ABNORMAL LOW (ref 22–32)
Calcium: 8.5 mg/dL — ABNORMAL LOW (ref 8.9–10.3)
Chloride: 108 mmol/L (ref 98–111)
Creatinine, Ser: 2.84 mg/dL — ABNORMAL HIGH (ref 0.61–1.24)
GFR, Estimated: 20 mL/min — ABNORMAL LOW (ref >60)
Glucose, Bld: 214 mg/dL — ABNORMAL HIGH (ref 70–99)
Potassium: 4.4 mmol/L (ref 3.5–5.1)
Sodium: 135 mmol/L (ref 135–145)

## 2021-07-09 LAB — GLUCOSE, CAPILLARY
Glucose-Capillary: 215 mg/dL — ABNORMAL HIGH (ref 70–99)
Glucose-Capillary: 220 mg/dL — ABNORMAL HIGH (ref 70–99)

## 2021-07-09 LAB — CBC
HCT: 27.4 % — ABNORMAL LOW (ref 39.0–52.0)
Hemoglobin: 8.4 g/dL — ABNORMAL LOW (ref 13.0–17.0)
MCH: 26.2 pg (ref 26.0–34.0)
MCHC: 30.7 g/dL (ref 30.0–36.0)
MCV: 85.4 fL (ref 80.0–100.0)
Platelets: 361 10*3/uL (ref 150–400)
RBC: 3.21 MIL/uL — ABNORMAL LOW (ref 4.22–5.81)
RDW: 17.1 % — ABNORMAL HIGH (ref 11.5–15.5)
WBC: 16.9 10*3/uL — ABNORMAL HIGH (ref 4.0–10.5)
nRBC: 0 % (ref 0.0–0.2)

## 2021-07-09 LAB — MAGNESIUM: Magnesium: 2.2 mg/dL (ref 1.7–2.4)

## 2021-07-09 MED ORDER — AMOXICILLIN-POT CLAVULANATE 500-125 MG PO TABS: 1.0000 | ORAL_TABLET | Freq: Two times a day (BID) | ORAL | 0 refills | Status: AC

## 2021-07-09 MED ORDER — SENNOSIDES-DOCUSATE SODIUM 8.6-50 MG PO TABS: 2.0000 | ORAL_TABLET | Freq: Two times a day (BID) | ORAL | Status: AC

## 2021-07-09 MED ORDER — NOVOLOG FLEXPEN 100 UNIT/ML ~~LOC~~ SOPN: 14.0000 [IU] | PEN_INJECTOR | Freq: Three times a day (TID) | SUBCUTANEOUS | 3 refills | Status: AC

## 2021-07-09 MED ORDER — LANTUS SOLOSTAR 100 UNIT/ML ~~LOC~~ SOPN: 55.0000 [IU] | PEN_INJECTOR | Freq: Every day | SUBCUTANEOUS | 3 refills | Status: AC

## 2021-07-09 NOTE — TOC Progression Note (Addendum)
Transition of Care [facility]) - Progression Note    Patient Details  Name: CEYLON ARENSON MRN: [id number] Date of Birth: Aug 24, 1927  Transition of Care [facility]) CM/SW Contact  Ludwig Clarks, [place] Phone Number: 07/09/2021, 10:04 AM  Clinical Narrative:     Pt for dc to [facility] today- confirmed plans with pt/family and SNF rep.   Expected Discharge Plan: [place] Barriers to Discharge: No Barriers Identified  Expected Discharge Plan and Services Expected Discharge Plan: [place]   Discharge Planning Services: CM Consult Post Acute Care Choice: [place] Living arrangements for the past 2 months: Single Family Home Expected Discharge Date: 07/09/21                       Social Determinants of Health (SDOH) Interventions    Readmission Risk Interventions No flowsheet data found.   Eduard Clos, MSW, LCSW Clinical Social Worker

## 2021-07-09 NOTE — Discharge Summary (Signed)
Physician Discharge Summary  Clayton [name] WCB:[id number] DOB: 1927/11/15 DOA: 07/02/2021  PCP: Lujean Amel, MD  Admit date: 07/02/2021 Discharge date: 07/09/2021  Admitted From: Home Disposition: [facility]  Recommendations for Outpatient Follow-up:  Follow up with PCP in 1-2 weeks Continue Augmentin to complete 10-day course for aspiration pneumonia Please obtain BMP/CBC in one week Recommend outpatient palliative care to follow following discharge versus consideration of hospice care given his advanced age, comorbidities and likely recurrent aspiration risk  Discharge Condition: Stable CODE STATUS: DNR Diet recommendation: Heart healthy/consistent carb regular diet, aspiration precautions, seated upright 90 degrees and remain upright 30-60 minutes following meal.  History of present illness:  Clayton [name] is a 88 year old male with past medical history significant for CAD, COPD, type 2 diabetes mellitus, hyperlipidemia, hyperparathyroidism, hypertension, IDA, OSA not on CPAP, BPH, osteoporosis, chronic systolic congestive heart failure, second-degree AV block who presents to [facility] ED on 07/02/2021 from his independent living facility with complaint of diarrhea for 4 days.  Also reporting productive cough with green sputum and shortness of breath.   In the ED, temperature 97.5 F, HR 88, RR 22, BP 160/72, SPO2 78% on room air.  Sodium 138, potassium 3.2, chloride 107, CO2 19, glucose 81, BUN 87, creatinine 0.03, AST 29, ALT 18, total Ruben 0.4.  Lipase 29.  WBC 17.1, hemoglobin 8.7, platelets 420.  COVID-19 PCR negative peer influenza A/B PCR negative.  Chest x-ray with bibasilar atelectasis and small pleural effusion.  CT abdomen/pelvis with worsening left greater than right lower lobe consolidation concerning for aspiration, bronchiectasis with mucoid impaction, constipation without evidence of SBO, moderate-severe dilation of the bladder, no focal bladder wall  thickening, 1 cm calcification pancreatic head increased in prominence of the proximal pancreatic duct likely indicating enlarging proximal pancreatic ductal stone, no biliary dilation.  Patient was started on ceftriaxone and metronidazole by EDP.  Hospital service consulted for further evaluation and management of acute hypoxic respiratory failure secondary to multifocal pneumonia with high suspicion of aspiration.  Hospital course:  Multifocal pneumonia, likely aspiration Patient presenting to the ED from independent living with productive cough of green sputum.  Elevated WBC count of 17.1.  CT abdomen/pelvis notable for diffuse consolidation bilateral lower lobes.  Sputum culture with Pseudomonas.  Continue Augmentin to complete 10-day course upon discharge.  Aspiration precautions.   Dysphagia Seen by speech therapy, currently recommending regular diet with thin liquids. Aspiration precautions   Acute renal failure on CKD stage IV Baseline creatinine 2.4, presented with a creatinine of 3.03.  Likely etiology secondary to prerenal acidemia from dehydration versus infection as above with pneumonia.  Repeat BMP 1 week.   Hypokalemia: Resolved Potassium 3.2 on admission.  Repleted during hospitalization.   Chronic systolic congestive heart failure, compensated No vascular congestion noted on imaging, appears euvolemic.  Last TTE with LVEF 45-50%.  Furosemide 20 g p.o. twice daily.  Recommend monitoring of daily weights.   Essential hypertension Imdur 30 mg p.o. daily, Furosemide 20 mg p.o. twice daily   BPH: Finasteride 5 mg p.o. daily   COPD Symbicort 160-4.5 mcg 2 puffs twice daily and Singulair 10 mg p.o. nightly   CAD Continue aspirin and atorvastatin   Type 2 diabetes mellitus Home regimen includes Lantus 22 units subcutaneously daily, NovoLog 8u with breakfast/lunch, and 9 units with dinner.  Hemoglobin A1c 7.9 on 06/27/2021.  Lantus 55 units of basal daily, NovoLog 10 units 3  times daily AC.  Continue to monitor glucose and adjust insulin  regimen as needed.   Persistent atrial fibrillation Follows with cardiology outpatient, Dr. Ellyn Hack.  Currently rate controlled on no medications.  Not on anticoagulation due to underlying anemia of chronic disease with likely slow GI bleed.   Iron deficiency anemia Follows with medical oncology outpatient, receives occasional IV iron infusions.   Prolonged QTc interval Avoid QT prolonging medications, maintain magnesium > 2.0 and potassium > 4.0   Stage III pressure ulcer sacrum, POA Continue local wound care, preventative measures, frequent offloading   Mild protein calorie malnutrition Body mass index is 27.52 kg/m. Nutrition Status: Nutrition Problem: Inadequate oral intake Etiology: acute illness, decreased appetite, diarrhea Signs/Symptoms: per patient/family report Interventions: MVI, Boost Breeze -- Dietitian consulted and followed during hospital course, continue to encourage increase oral intake   Constipation: Senokot 2 tablets p.o. twice daily, MiraLAX daily   Weakness/abilities/deconditioning: Discharging to SNF for further rehabilitation.  Ethics: Patient with advanced age, multiple comorbidities with high aspiration risk.  Recommend outpatient palliative care to follow-up, patient likely candidate for outpatient hospice.  DNR.  Discharge Diagnoses:  Principal Problem:   Multifocal pneumonia Active Problems:   Essential hypertension   BPH (benign prostatic hyperplasia)   Chronic kidney disease, stage IV (severe) (HCC)   CAD, multiple vessel   DM (diabetes mellitus), type 2 with renal complications (HCC)   Chronic heart failure with preserved ejection fraction (HFpEF) (HCC)   Anemia in chronic kidney disease   Persistent atrial fibrillation (HCC)   Prolonged QT interval   Stage III pressure ulcer of sacral region ([place])   Mild protein malnutrition ([place])    Discharge Instructions  Discharge  Instructions     Call MD for:  difficulty breathing, headache or visual disturbances   Complete by: As directed    Call MD for:  extreme fatigue   Complete by: As directed    Call MD for:  persistant dizziness or light-headedness   Complete by: As directed    Call MD for:  persistant nausea and vomiting   Complete by: As directed    Call MD for:  severe uncontrolled pain   Complete by: As directed    Call MD for:  temperature >100.4   Complete by: As directed    Diet - low sodium heart healthy   Complete by: As directed    Increase activity slowly   Complete by: As directed    No wound care   Complete by: As directed       Allergies as of 07/09/2021       Reactions   Doxycycline Rash   Levaquin [levofloxacin In D5w]    Caused chest pain and heartburn   Aliskiren Other (See Comments)   Unknown per Daughter    Lisinopril Other (See Comments)   Unknown per Daughter    Losartan Potassium Other (See Comments)   Nitrous Oxide Other (See Comments)   Reaction:  Unknown    Penicillins Other (See Comments)   Tolerating augmentin Jan 2023 Reaction:  Unknown Has patient had a PCN reaction causing immediate rash, facial/tongue/throat swelling, SOB or lightheadedness with hypotension: Unsure Has patient had a PCN reaction causing severe rash involving mucus membranes or skin necrosis: Unsure Has patient had a PCN reaction that required hospitalization Unsure  Has patient had a PCN reaction occurring within the last 10 years: No If all of the above answers are "NO", then may proceed with Cephalosporin use.   Tape Other (See Comments)   Reaction:  Tears pts skin  Diltiazem Rash        Medication List     STOP taking these medications    Accu-Chek Softclix Lancets lancets   omeprazole 20 MG capsule Commonly known as: PRILOSEC       TAKE these medications    albuterol 108 (90 Base) MCG/ACT inhaler Commonly known as: VENTOLIN HFA Inhale 2 puffs into the lungs every 6  (six) hours as needed for shortness of breath.   amoxicillin-clavulanate 500-125 MG tablet Commonly known as: AUGMENTIN Take 1 tablet (500 mg total) by mouth 2 (two) times daily for 3 days.   aspirin EC 81 MG tablet Take 1 tablet (81 mg total) by mouth daily. Swallow whole.   atorvastatin 40 MG tablet Commonly known as: LIPITOR Take 1 tablet (40 mg total) by mouth daily. What changed:  when to take this additional instructions   B-D SINGLE USE SWABS REGULAR Pads USE 7 SWABS DAILY AS DIRECTED   bacitracin-polymyxin b ointment Commonly known as: POLYSPORIN Apply 1 application topically 2 (two) times daily as needed (irritated skin).   budesonide-formoterol 160-4.5 MCG/ACT inhaler Commonly known as: SYMBICORT Inhale 2 puffs into the lungs 2 (two) times daily.   finasteride 5 MG tablet Commonly known as: PROSCAR TAKE 1 TABLET EVERY DAY   FreeStyle Libre 14 Day Reader Devi 1 each by Does not apply route every 14 (fourteen) days. Use reader to monitor blood sugar continuously with freestyle libre sensor.   FreeStyle Libre 14 Day Sensor Misc USE AS DIRECTED EVERY 14 DAYS TO MONITOR BLOOD SUGAR CONTINUOUSLY   furosemide 20 MG tablet Commonly known as: LASIX Take 1 tablet (20 mg total) by mouth 2 (two) times daily. Take seocnd dose about 12 noon each daily What changed:  additional instructions Another medication with the same name was removed. Continue taking this medication, and follow the directions you see here.   glucose blood test strip Use as instructed to test blood sugars 4 times daily   isosorbide mononitrate 30 MG 24 hr tablet Commonly known as: IMDUR Take 1 tablet (30 mg total) by mouth daily.   Lantus SoloStar 100 UNIT/ML Solostar Pen Generic drug: insulin glargine Inject 55 Units into the skin daily. Inject 0.16 mLs (16 Units total) into the skin daily. What changed:  how much to take how to take this when to take this   melatonin 5 MG Tabs Take 5 mg  by mouth at bedtime.   montelukast 10 MG tablet Commonly known as: SINGULAIR Take 10 mg by mouth daily.   nitroGLYCERIN 0.4 MG SL tablet Commonly known as: NITROSTAT Place 1 tablet (0.4 mg total) under the tongue every 5 (five) minutes as needed for chest pain.   NovoLOG FlexPen 100 UNIT/ML FlexPen Generic drug: insulin aspart Inject 14 Units into the skin 3 (three) times daily with meals. What changed:  how much to take how to take this when to take this additional instructions   oxybutynin 5 MG tablet Commonly known as: DITROPAN Take 5 mg by mouth 2 (two) times daily.   polyethylene glycol 17 g packet Commonly known as: MIRALAX / GLYCOLAX Take 17 g by mouth at bedtime.   senna-docusate 8.6-50 MG tablet Commonly known as: Senokot-S Take 2 tablets by mouth 2 (two) times daily.   TRUEplus Insulin Syringe 31G X 5/16" 0.3 ML Misc Generic drug: Insulin Syringe-Needle U-100 USE THREE TIMES DAILY AS NEEDED   Vitamin D 50 MCG (2000 UT) Caps Take 2,000 Units by mouth daily.  Follow-up Information     Koirala, Dibas, MD. Schedule an appointment as soon as possible for a visit in 1 week(s).   Specialty: Family Medicine Contact information: [place] [address] [phone]         Leonie Man, MD .   Specialty: Cardiology Contact information: [address] [place] [address] [phone]                Allergies  Allergen Reactions   Doxycycline Rash   Levaquin [Levofloxacin In D5w]     Caused chest pain and heartburn   Aliskiren Other (See Comments)    Unknown per Daughter    Lisinopril Other (See Comments)    Unknown per Daughter    Losartan Potassium Other (See Comments)   Nitrous Oxide Other (See Comments)    Reaction:  Unknown    Penicillins Other (See Comments)    Tolerating augmentin Jan 2023 Reaction:  Unknown Has patient had a PCN reaction causing immediate rash,  facial/tongue/throat swelling, SOB or lightheadedness with hypotension: Unsure Has patient had a PCN reaction causing severe rash involving mucus membranes or skin necrosis: Unsure Has patient had a PCN reaction that required hospitalization Unsure  Has patient had a PCN reaction occurring within the last 10 years: No If all of the above answers are "NO", then may proceed with Cephalosporin use.   Tape Other (See Comments)    Reaction:  Tears pts skin    Diltiazem Rash    Consultations: None   Procedures/Studies: CT ABDOMEN PELVIS WO CONTRAST  Result Date: 07/03/2021 CLINICAL DATA:  Recently increasing cough and abdominal pain. Not eating according to daughter. EXAM: CT ABDOMEN AND PELVIS WITHOUT CONTRAST TECHNIQUE: Multidetector CT imaging of the abdomen and pelvis was performed following the standard protocol without IV contrast. RADIATION DOSE REDUCTION: This exam was performed according to the departmental dose-optimization program which includes automated exposure control, adjustment of the mA and/or kV according to patient size and/or use of iterative reconstruction technique. COMPARISON:  Portable chest today and 05/15/2021, pelvic CT no contrast 03/28/2021. CT chest, abdomen and pelvis no contrast 08/15/2020 FINDINGS: Lower chest: There is increasingly dense consolidation in the right greater than left lower lobes. There is less extensive consolidation previously. Findings may suggest acute on chronic aspiration. Background bronchiectasis in lower lobes with scattered bronchial impaction, tree-in-bud interstitial changes above the levels of dense consolidation Remaining lung bases are clear with COPD change. There is three-vessel calcific CAD, normal cardiac size with low-density of the intracardiac blood pool consistent with anemia. Trace pleural effusions. Hepatobiliary: No focal liver abnormality is seen. No gallstones, gallbladder wall thickening, or biliary dilatation. Pancreas: There  is a 1 cm calcification in the head of the pancreas. The previous study demonstrated a 3 mm calcification in this location. There is prominence of the proximal pancreatic duct but the mid to distal pancreatic duct is not dilated. There is no adjacent inflammatory change. Spleen: Unremarkable without contrast. Adrenals/Urinary Tract: There is no adrenal mass. There are small renal cysts. No suspicious abnormality of unenhanced renal cortex. There is no evidence of urinary stones or hydronephrosis. There is moderate severe dilatation of the bladder with the dome reaching the mid L4 level. No bladder thickening is seen. Stomach/Bowel: Gastric wall is contracted. There is no small bowel obstruction or inflammation. Normal appendix. There are fluid levels in the ascending colon, moderate stool retention transverse and descending segment. Scattered sigmoid diverticula without evidence of diverticulitis. Vascular/Lymphatic:  Aortic atherosclerosis. No enlarged abdominal or pelvic lymph nodes. Reproductive: The prostate is not enlarged. Other: Small umbilical and inguinal fat hernias. No free air, hemorrhage or fluid. Musculoskeletal: Osteopenia and degenerative change thoracolumbar spine, multilevel bridging thoracic spine enthesopathy. Grade 1 degenerative L4-5 anterolisthesis with advanced facet hypertrophy. Ankylosis both SI joints. IMPRESSION: 1. Worsening left-greater-than-right lower lobe consolidation, consider acute on chronic aspiration. Background bronchiectasis with scattered mucoid impaction. Anterior lung bases are clear. 2. Constipation, without evidence of small-bowel obstruction. Fluid in the ascending colon. 3. Moderate to severe dilatation of the bladder. No appreciable enlargement of the prostate. Correlate clinically for outlet obstruction. No focal bladder wall thickening. 4. Aortic atherosclerosis. 5. 1 cm calcification in the pancreatic head which is in the location of a previous 3 mm calcification.  There is increased prominence of the proximal pancreatic duct. Findings could indicate an enlarging proximal pancreatic ductal stone. No biliary dilatation is seen. Consider MRCP follow-up. 6. Osteopenia and degenerative change. Electronically Signed   By: Telford Nab M.D.   On: 07/03/2021 01:03   DG Chest Port 1 View  Result Date: 07/02/2021 CLINICAL DATA:  Diarrhea for 4 days EXAM: PORTABLE CHEST 1 VIEW COMPARISON:  05/15/2021 FINDINGS: Bibasilar opacities, left-greater-than-right. Small left pleural effusion. Cardiomediastinal contours are normal. IMPRESSION: Bibasilar atelectasis and small left pleural effusion. Electronically Signed   By: Ulyses Jarred M.D.   On: 07/02/2021 23:18     Subjective: Patient seen examined at bedside, resting comfort.  Eating breakfast.  No family present.  Discharging to SNF today.  No specific complaints or concerns at this time.  Denies headache, no chest pain, palpitations, no shortness of breath, no abdominal pain.  No acute events overnight per nursing staff.  Discharge Exam: Vitals:   07/09/21 0557 07/09/21 0832  BP: (!) 144/58   Pulse: 84   Resp: 18   Temp: 98.2 F (36.8 C)   SpO2: 96% 97%   Vitals:   07/08/21 2019 07/09/21 0500 07/09/21 0557 07/09/21 0832  BP:   (!) 144/58   Pulse:   84   Resp:   18   Temp:   98.2 F (36.8 C)   TempSrc:      SpO2: 97%  96% 97%  Weight:  88.4 kg    Height:        General: Pt is alert, awake, not in acute distress, chronically ill/elderly in appearance Cardiovascular: RRR, S1/S2 +, no rubs, no gallops Respiratory: CTA bilaterally, no wheezing, no rhonchi, on room air Abdominal: Soft, NT, ND, bowel sounds + Extremities: no edema, no cyanosis    The results of significant diagnostics from this hospitalization (including imaging, microbiology, ancillary and laboratory) are listed below for reference.     Microbiology: Recent Results (from the past 240 hour(s))  Resp Panel by RT-PCR (Flu A&B, Covid)  Nasopharyngeal Swab     Status: None   Collection Time: 07/02/21 10:40 PM   Specimen: Nasopharyngeal Swab; Nasopharyngeal(NP) swabs in vial transport medium  Result Value Ref Range Status   SARS Coronavirus 2 by RT PCR NEGATIVE NEGATIVE Final    Comment: (NOTE) SARS-CoV-2 target nucleic acids are NOT DETECTED.  The SARS-CoV-2 RNA is generally detectable in upper respiratory specimens during the acute phase of infection. The lowest concentration of SARS-CoV-2 viral copies this assay can detect is 138 copies/mL. A negative result does not preclude SARS-Cov-2 infection and should not be used as the sole basis for treatment or other patient management decisions. A negative result may occur with  improper specimen collection/handling, submission of specimen other than nasopharyngeal swab, presence of viral mutation(s) within the areas targeted by this assay, and inadequate number of viral copies(<138 copies/mL). A negative result must be combined with clinical observations, patient history, and epidemiological information. The expected result is Negative.  Fact Sheet for Patients:  EntrepreneurPulse.com.au  Fact Sheet for Healthcare Providers:  IncredibleEmployment.be  This test is no t yet approved or cleared by the [place] FDA and  has been authorized for detection and/or diagnosis of SARS-CoV-2 by FDA under an Emergency Use Authorization (EUA). This EUA will remain  in effect (meaning this test can be used) for the duration of the COVID-19 declaration under Section 564(b)(1) of the Act, 21 U.S.C.section 360bbb-3(b)(1), unless the authorization is terminated  or revoked sooner.       Influenza A by PCR NEGATIVE NEGATIVE Final   Influenza B by PCR NEGATIVE NEGATIVE Final    Comment: (NOTE) The Xpert Xpress SARS-CoV-2/FLU/RSV plus assay is intended as an aid in the diagnosis of influenza from Nasopharyngeal swab specimens and should not be  used as a sole basis for treatment. Nasal washings and aspirates are unacceptable for Xpert Xpress SARS-CoV-2/FLU/RSV testing.  Fact Sheet for Patients: EntrepreneurPulse.com.au  Fact Sheet for Healthcare Providers: IncredibleEmployment.be  This test is not yet approved or cleared by the [place] FDA and has been authorized for detection and/or diagnosis of SARS-CoV-2 by FDA under an Emergency Use Authorization (EUA). This EUA will remain in effect (meaning this test can be used) for the duration of the COVID-19 declaration under Section 564(b)(1) of the Act, 21 U.S.C. section 360bbb-3(b)(1), unless the authorization is terminated or revoked.  Performed at [facility], [place] [address], [place], [place] [address]   Expectorated Sputum Assessment w Gram Stain, Rflx to Resp Cult     Status: None   Collection Time: 07/03/21 12:25 PM   Specimen: Sputum  Result Value Ref Range Status   Specimen Description SPUTUM  Final   Special Requests NONE  Final   Sputum evaluation   Final    THIS SPECIMEN IS ACCEPTABLE FOR SPUTUM CULTURE Performed at [facility], [place] [address] [place], [place] [address]    Report Status 07/03/2021 FINAL  Final  Culture, Respiratory w Gram Stain     Status: None   Collection Time: 07/03/21 12:25 PM   Specimen: SPU  Result Value Ref Range Status   Specimen Description   Final    SPUTUM Performed at [place] [address] [place], [place] [address]    Special Requests   Final    NONE Reflexed from [id number] Performed at [facility], [place] [address] [place], [place] [address]    Gram Stain   Final    NO SQUAMOUS EPITHELIAL CELLS SEEN FEW WBC SEEN FEW GRAM POSITIVE COCCI    Culture   Final    RARE PSEUDOMONAS AERUGINOSA NO STAPHYLOCOCCUS AUREUS ISOLATED Performed at [facility], [address] [place], [place] [address]     Report Status 07/05/2021 FINAL  Final   Organism ID, Bacteria PSEUDOMONAS AERUGINOSA  Final      Susceptibility   Pseudomonas aeruginosa - MIC*    CEFTAZIDIME 2 SENSITIVE Sensitive     CIPROFLOXACIN <=0.25 SENSITIVE Sensitive     GENTAMICIN 2 SENSITIVE Sensitive     IMIPENEM 2 SENSITIVE Sensitive     PIP/TAZO <=4 SENSITIVE Sensitive     CEFEPIME 2 SENSITIVE Sensitive     *  RARE PSEUDOMONAS AERUGINOSA     Labs: BNP (last 3 results) Recent Labs    03/10/21 2256  BNP 9,702.6*   Basic Metabolic Panel: Recent Labs  Lab 07/02/21 2246 07/04/21 0346 07/05/21 0345 07/06/21 0324 07/09/21 0400  NA 138 137 137 137 135  K 3.2* 3.4* 4.5 4.6 4.4  CL 107 109 111 110 108  CO2 19* 19* 19* 17* 18*  GLUCOSE 81 159* 224* 336* 214*  BUN 87* 84* 80* 70* 82*  CREATININE 3.03* 2.77* 2.51* 2.51* 2.84*  CALCIUM 8.2* 7.6* 8.0* 8.1* 8.5*  MG  --   --  1.9 1.8 2.2  PHOS  --   --  4.6  --   --    Liver Function Tests: Recent Labs  Lab 07/02/21 2246 07/04/21 0346 07/05/21 0345  AST 29 20 16   ALT 18 15 14   ALKPHOS 85 71 73  BILITOT 0.4 0.6 0.5  PROT 7.1 5.6* 5.8*  ALBUMIN 3.1* 2.4* 2.5*   Recent Labs  Lab 07/02/21 2246  LIPASE 29   No results for input(s): AMMONIA in the last 168 hours. CBC: Recent Labs  Lab 07/02/21 2246 07/04/21 0346 07/05/21 0345 07/06/21 0324 07/09/21 0400  WBC 17.1* 10.1 11.2* 12.3* 16.9*  NEUTROABS 13.8*  --  8.0*  --   --   HGB 8.7* 7.3* 7.7* 8.3* 8.4*  HCT 28.9* 24.9* 25.6* 26.8* 27.4*  MCV 87.0 87.7 87.1 85.9 85.4  PLT 420* 333 351 352 361   Cardiac Enzymes: No results for input(s): CKTOTAL, CKMB, CKMBINDEX, TROPONINI in the last 168 hours. BNP: Invalid input(s): POCBNP CBG: Recent Labs  Lab 07/08/21 1140 07/08/21 1602 07/08/21 1818 07/08/21 2141 07/09/21 0716  GLUCAP 311* 251* 280* 208* 215*   D-Dimer No results for input(s): DDIMER in the last 72 hours. Hgb A1c No results for input(s): HGBA1C in the last 72 hours. Lipid  Profile No results for input(s): CHOL, HDL, LDLCALC, TRIG, CHOLHDL, LDLDIRECT in the last 72 hours. Thyroid function studies No results for input(s): TSH, T4TOTAL, T3FREE, THYROIDAB in the last 72 hours.  Invalid input(s): FREET3 Anemia work up No results for input(s): VITAMINB12, FOLATE, FERRITIN, TIBC, IRON, RETICCTPCT in the last 72 hours. Urinalysis    Component Value Date/Time   COLORURINE YELLOW 07/03/2021 0118   APPEARANCEUR CLEAR 07/03/2021 0118   LABSPEC 1.011 07/03/2021 0118   PHURINE 5.0 07/03/2021 0118   GLUCOSEU NEGATIVE 07/03/2021 0118   GLUCOSEU NEGATIVE 07/19/2015 0829   HGBUR SMALL (A) 07/03/2021 0118   BILIRUBINUR NEGATIVE 07/03/2021 0118   KETONESUR NEGATIVE 07/03/2021 0118   PROTEINUR 100 (A) 07/03/2021 0118   UROBILINOGEN 0.2 07/19/2015 0829   NITRITE NEGATIVE 07/03/2021 0118   LEUKOCYTESUR NEGATIVE 07/03/2021 0118   Sepsis Labs Invalid input(s): PROCALCITONIN,  WBC,  LACTICIDVEN Microbiology Recent Results (from the past 240 hour(s))  Resp Panel by RT-PCR (Flu A&B, Covid) Nasopharyngeal Swab     Status: None   Collection Time: 07/02/21 10:40 PM   Specimen: Nasopharyngeal Swab; Nasopharyngeal(NP) swabs in vial transport medium  Result Value Ref Range Status   SARS Coronavirus 2 by RT PCR NEGATIVE NEGATIVE Final    Comment: (NOTE) SARS-CoV-2 target nucleic acids are NOT DETECTED.  The SARS-CoV-2 RNA is generally detectable in upper respiratory specimens during the acute phase of infection. The lowest concentration of SARS-CoV-2 viral copies this assay can detect is 138 copies/mL. A negative result does not preclude SARS-Cov-2 infection and should not be used as the sole basis for treatment or other patient  management decisions. A negative result may occur with  improper specimen collection/handling, submission of specimen other than nasopharyngeal swab, presence of viral mutation(s) within the areas targeted by this assay, and inadequate number of  viral copies(<138 copies/mL). A negative result must be combined with clinical observations, patient history, and epidemiological information. The expected result is Negative.  Fact Sheet for Patients:  EntrepreneurPulse.com.au  Fact Sheet for Healthcare Providers:  IncredibleEmployment.be  This test is no t yet approved or cleared by the [place] FDA and  has been authorized for detection and/or diagnosis of SARS-CoV-2 by FDA under an Emergency Use Authorization (EUA). This EUA will remain  in effect (meaning this test can be used) for the duration of the COVID-19 declaration under Section 564(b)(1) of the Act, 21 U.S.C.section 360bbb-3(b)(1), unless the authorization is terminated  or revoked sooner.       Influenza A by PCR NEGATIVE NEGATIVE Final   Influenza B by PCR NEGATIVE NEGATIVE Final    Comment: (NOTE) The Xpert Xpress SARS-CoV-2/FLU/RSV plus assay is intended as an aid in the diagnosis of influenza from Nasopharyngeal swab specimens and should not be used as a sole basis for treatment. Nasal washings and aspirates are unacceptable for Xpert Xpress SARS-CoV-2/FLU/RSV testing.  Fact Sheet for Patients: EntrepreneurPulse.com.au  Fact Sheet for Healthcare Providers: IncredibleEmployment.be  This test is not yet approved or cleared by the [place] FDA and has been authorized for detection and/or diagnosis of SARS-CoV-2 by FDA under an Emergency Use Authorization (EUA). This EUA will remain in effect (meaning this test can be used) for the duration of the COVID-19 declaration under Section 564(b)(1) of the Act, 21 U.S.C. section 360bbb-3(b)(1), unless the authorization is terminated or revoked.  Performed at [facility], [place] [address] [place], [place] [address]   Expectorated Sputum Assessment w Gram Stain, Rflx to Resp Cult     Status: None   Collection Time:  07/03/21 12:25 PM   Specimen: Sputum  Result Value Ref Range Status   Specimen Description SPUTUM  Final   Special Requests NONE  Final   Sputum evaluation   Final    THIS SPECIMEN IS ACCEPTABLE FOR SPUTUM CULTURE Performed at [facility], [place] [address], [place], [place] [address]    Report Status 07/03/2021 FINAL  Final  Culture, Respiratory w Gram Stain     Status: None   Collection Time: 07/03/21 12:25 PM   Specimen: SPU  Result Value Ref Range Status   Specimen Description   Final    SPUTUM Performed at [place] [address], [place], [place] [address]    Special Requests   Final    NONE Reflexed from [id number] Performed at [facility], [place] [address], [place], [place] [address]    Gram Stain   Final    NO SQUAMOUS EPITHELIAL CELLS SEEN FEW WBC SEEN FEW GRAM POSITIVE COCCI    Culture   Final    RARE PSEUDOMONAS AERUGINOSA NO STAPHYLOCOCCUS AUREUS ISOLATED Performed at [facility], [address], [place], [place] [address]    Report Status 07/05/2021 FINAL  Final   Organism ID, Bacteria PSEUDOMONAS AERUGINOSA  Final      Susceptibility   Pseudomonas aeruginosa - MIC*    CEFTAZIDIME 2 SENSITIVE Sensitive     CIPROFLOXACIN <=0.25 SENSITIVE Sensitive     GENTAMICIN 2 SENSITIVE Sensitive     IMIPENEM 2 SENSITIVE Sensitive     PIP/TAZO <=4 SENSITIVE Sensitive  CEFEPIME 2 SENSITIVE Sensitive     * RARE PSEUDOMONAS AERUGINOSA     Time coordinating discharge: Over 30 minutes  SIGNED:   Jamarria Real J [place] DO  [facility] 07/09/2021, 9:00 AM

## 2021-07-09 NOTE — Progress Notes (Addendum)
AVS given to patient and explained at the bedside and with the receiving nurse at [facility]. Medications and follow up appointments have been explained with pt and the pt's receiving nurse verbalizing understanding. PT[facility] called for transfer to [facility].

## 2021-07-15 ENCOUNTER — Encounter: Payer: Self-pay | Admitting: Hematology and Oncology

## 2021-07-18 ENCOUNTER — Other Ambulatory Visit: Payer: Self-pay

## 2021-07-18 ENCOUNTER — Non-Acute Institutional Stay: Payer: Self-pay | Admitting: Hospice

## 2021-07-18 DIAGNOSIS — K5901 Slow transit constipation: Secondary | ICD-10-CM

## 2021-07-18 DIAGNOSIS — J449 Chronic obstructive pulmonary disease, unspecified: Secondary | ICD-10-CM

## 2021-07-18 DIAGNOSIS — R531 Weakness: Secondary | ICD-10-CM

## 2021-07-18 DIAGNOSIS — Z515 Encounter for palliative care: Secondary | ICD-10-CM

## 2021-07-18 DIAGNOSIS — J189 Pneumonia, unspecified organism: Secondary | ICD-10-CM

## 2021-07-18 NOTE — Progress Notes (Signed)
[facility] Consult Note Telephone: [phone]  Fax: [phone]  PATIENT NAME: Clayton [name] [address] [place] [address] [phone] (home)  DOB: 1927-10-02 MRN: [id number]  PRIMARY CARE PROVIDER:    Lujean Amel, MD,  [place] [id number] [phone]  [place]:   Clayton Amel, MD [address] [place],   [address] [phone]  RESPONSIBLE PARTY:   Clayton [name] is [facility] Information     Name Relation Home Work Mobile   Clayton [name] Daughter [phone]     Clayton [name] Daughter   [phone]   Clayton [name]   [phone]        I met face to face with patient and family at facility. Palliative Care was asked to follow this patient by consultation request of  Koirala, Dibas, MD to address advance care planning, complex medical decision making and goals of care clarification. Daughter Clayton [name] and Clayton [name] are with patient during visit. This is the initial visit.    ASSESSMENT AND / RECOMMENDATIONS:   Advance Care Planning: Our advance care planning conversation included a discussion about:    The value and importance of advance care planning  Difference between Hospice and Palliative care Exploration of goals of care in the event of a sudden injury or illness  Identification and preparation of a healthcare agent  Review and updating or creation of an  advance directive document . Decision not to resuscitate or to de-escalate disease focused treatments due to poor prognosis.  CODE STATUS: Patient is a Do Not Resuscitate.   Goals of Care: Goals include to maximize quality of life and symptom management  I spent  20 minutes providing this initial consultation. More than 50% of the time in this consultation was spent on counseling patient and coordinating  communication. --------------------------------------------------------------------------------------------------------------------------------------  Symptom Management/Plan: Multifocal Pneumonia: likely aspiration. Hospitalized 1/15-1/22/23. Completed Augmentin. Continue on piperacillin and Tazobactam IV as ordered and to completion to finish Feb 4th 2023. Use incentive spirometer.  Routine CBC CMP. COPD: Symbicort 160-4.5 mcg 2 puffs twice daily and Singulair 10 mg p.o. nightly. Albuterol is on hand. Continue oxygen supplementation at 3L/Min Weakness: PT/OT for strengthening. Total lift for transfers.  Constipation: Managed with Dulcolax supp and Miralax.  Follow up: Palliative care will continue to follow for complex medical decision making, advance care planning, and clarification of goals. Return 6 weeks or prn. Encouraged to call provider sooner with any concerns.   Family /Caregiver/Community Supports: Patient in SNF for ongoing care  HOSPICE ELIGIBILITY/DIAGNOSIS: TBD  Chief Complaint: Initial Palliative care visit  HISTORY OF PRESENT ILLNESS:  Clayton [name] is a 88 y.o. year old male  with multiple medical conditions including  acute on chronic weakness related to multifocal pneumonia for which he was recently hospitalized for 7 days. Patient is currently non ambulatory, total lift for all transfers. Weakness impairs his ADLS, worse during hygiene care. PT/OT is ongoing. History of CHF, CKD 4, COPD, Dysphagia.  History obtained from review of EMR, discussion with primary team, caregiver, family and/or Mr. Thammavong.  Review and summarization of Epic records shows history from other than patient. Rest of 10 point ROS asked and negative.  I reviewed as needed, available labs, patient records, imaging, studies and related documents from the EMR.  ROS General: NAD EYES: denies vision changes ENMT: denies dysphagia Cardiovascular: denies chest pain/discomfort Pulmonary: denies cough,  denies SOB Abdomen: endorses good appetite, denies constipation/diarrhea GU: denies dysuria, urinary  frequency MSK:  endorses weakness,  no falls reported Neurological: denies pain, denies insomnia Psych: Endorses positive mood Heme/lymph/immuno: denies bruises, abnormal bleeding  Physical Exam: Constitutional: NAD General: Well groomed, cooperative EYES: anicteric sclera, lids intact, no discharge  ENMT: Moist mucous membrane CV: S1 S2, RRR, no LE edema Pulmonary: LCTA, no increased work of breathing, occasional productive cough, oxygen supplementation Abdomen: active BS + 4 quadrants, soft and non tender GU: no suprapubic tenderness MSK: weakness, bed bound limited ROM Skin: warm and dry, no rashes or wounds on visible skin Neuro:  weakness, otherwise non focal Psych: non-anxious affect Hem/lymph/immuno: no widespread bruising   PAST MEDICAL HISTORY:  Active Ambulatory Problems    Diagnosis Date Noted   Essential hypertension 05/19/2010   OSA treated with BiPAP 05/19/2010   Hypoxia 06/19/2012   Diabetes mellitus ([place]) 06/19/2012   Hard of hearing 06/19/2012   Solitary pulmonary nodule 06/27/2012   Smoking history 07/24/2012   Cough variant asthma 07/24/2012   Dyspnea 03/04/2013   Hyperlipidemia associated with type 2 diabetes mellitus ([place]) 04/06/2013   BPH (benign prostatic hyperplasia) 07/03/2013   Type II or unspecified type diabetes mellitus with renal manifestations, uncontrolled(250.42) 02/03/2014   Chronic kidney disease, stage IV (severe) ([place]) 08/07/2016   Cellulitis of right hand 08/11/2016   Iron deficiency 10/30/2016   ST-segment elevation myocardial infarction (STEMI) of inferior wall ([place]) 04/20/2018   Symptomatic bradycardia 04/20/2018   AV heart block    CAD, multiple vessel    Normocytic normochromic anemia    Chronic obstructive pulmonary disease ([place])    Rhonchi at both lung bases    Presence of drug-eluting stent in right coronary artery  04/18/2018   Unsteady gait when walking 12/14/2019   Weight loss, unintentional 09/12/2020   Leukocytosis 09/12/2020   DM (diabetes mellitus), type 2 with renal complications ([place]) 01/75/1025   Chronic heart failure with preserved ejection fraction (HFpEF) ([place]) 03/11/2021   Symptomatic anemia 03/11/2021   Elevated troponin 03/11/2021   Anemia in chronic kidney disease 03/15/2021   Persistent atrial fibrillation ([place]) 05/13/2021   Multifocal pneumonia 07/03/2021   Prolonged QT interval 07/03/2021   Stage III pressure ulcer of sacral region ([place]) 07/03/2021   Mild protein malnutrition ([place]) 07/03/2021   Resolved Ambulatory Problems    Diagnosis Date Noted   Bronchitis 06/19/2012   CKD (chronic kidney disease) stage 3, GFR 30-59 ml/min (HCC) 06/19/2012   H1N1 influenza with pneumonia 06/20/2012   Acute respiratory failure ([place]) 06/20/2012   Type II or unspecified type diabetes mellitus without mention of complication, not stated as uncontrolled 01/01/2013   Type II or unspecified type diabetes mellitus without mention of complication, uncontrolled 07/03/2013   Cellulitis 08/07/2016   Cellulitis of right upper extremity 08/07/2016   NSTEMI (non-ST elevated myocardial infarction) ([place]) 03/11/2021   Hypokalemia 07/03/2021   Hypoglycemia 07/03/2021   Past Medical History:  Diagnosis Date   Anemia    CAD (coronary artery disease)    Chronic renal failure    Chronic renal insufficiency    COPD (chronic obstructive pulmonary disease) (HCC)    DDD (degenerative disc disease), lumbar    Diabetes mellitus    Hyperlipidemia    Hyperparathyroidism ([place])    Hypertension    IDA (iron deficiency anemia)    Neuromuscular disorder (HCC)    OSA on CPAP    Osteomyelitis of forearm ([place])    Osteoporosis    Wenckebach second degree AV block 11/2020    SOCIAL HX:  Social History  Tobacco Use   Smoking status: Former    Packs/day: 3.00    Years: 33.00    Pack years: 99.00    Types:  Cigarettes    Quit date: 06/19/1973    Years since quitting: 48.1   Smokeless tobacco: Former    Quit date: 09/26/1975  Substance Use Topics   Alcohol use: No     FAMILY HX:  Family History  Problem Relation Age of Onset   Diabetes Mellitus II Other    Hypertension Other    Other Father        complications from strep   CVA Father    Cancer Sister    Other Brother        hip replacement complications       ALLERGIES:  Allergies  Allergen Reactions   Doxycycline Rash   Levaquin [Levofloxacin In D5w]     Caused chest pain and heartburn   Aliskiren Other (See Comments)    Unknown per Daughter    Lisinopril Other (See Comments)    Unknown per Daughter    Losartan Potassium Other (See Comments)   Nitrous Oxide Other (See Comments)    Reaction:  Unknown    Penicillins Other (See Comments)    Tolerating augmentin Jan 2023 Reaction:  Unknown Has patient had a PCN reaction causing immediate rash, facial/tongue/throat swelling, SOB or lightheadedness with hypotension: Unsure Has patient had a PCN reaction causing severe rash involving mucus membranes or skin necrosis: Unsure Has patient had a PCN reaction that required hospitalization Unsure  Has patient had a PCN reaction occurring within the last 10 years: No If all of the above answers are "NO", then may proceed with Cephalosporin use.   Tape Other (See Comments)    Reaction:  Tears pts skin    Diltiazem Rash      PERTINENT MEDICATIONS:  Outpatient Encounter Medications as of 07/18/2021  Medication Sig   albuterol (PROVENTIL HFA;VENTOLIN HFA) 108 (90 Base) MCG/ACT inhaler Inhale 2 puffs into the lungs every 6 (six) hours as needed for shortness of breath.   Alcohol Swabs (B-D SINGLE USE SWABS REGULAR) PADS USE 7 SWABS DAILY AS DIRECTED   aspirin EC 81 MG tablet Take 1 tablet (81 mg total) by mouth daily. Swallow whole.   atorvastatin (LIPITOR) 40 MG tablet Take 1 tablet (40 mg total) by mouth daily. (Patient taking  differently: Take 40 mg by mouth See admin instructions. 65m daily on Monday's, Wednesday's, Friday's, and Saturday's)   bacitracin-polymyxin b (POLYSPORIN) ointment Apply 1 application topically 2 (two) times daily as needed (irritated skin).   budesonide-formoterol (SYMBICORT) 160-4.5 MCG/ACT inhaler Inhale 2 puffs into the lungs 2 (two) times daily.   Cholecalciferol (VITAMIN D) 50 MCG (2000 UT) CAPS Take 2,000 Units by mouth daily.   Continuous Blood Gluc Receiver (FREESTYLE LIBRE 14 DAY READER) DEVI 1 each by Does not apply route every 14 (fourteen) days. Use reader to monitor blood sugar continuously with freestyle libre sensor.   Continuous Blood Gluc Sensor (FREESTYLE LIBRE 14 DAY SENSOR) MISC USE AS DIRECTED EVERY 14 DAYS TO MONITOR BLOOD SUGAR CONTINUOUSLY   finasteride (PROSCAR) 5 MG tablet TAKE 1 TABLET EVERY DAY (Patient taking differently: Take 5 mg by mouth daily.)   furosemide (LASIX) 20 MG tablet Take 1 tablet (20 mg total) by mouth 2 (two) times daily. Take seocnd dose about 12 noon each daily (Patient taking differently: Take 20 mg by mouth 2 (two) times daily.)   glucose blood test strip Use  as instructed to test blood sugars 4 times daily   insulin aspart (NOVOLOG FLEXPEN) 100 UNIT/ML FlexPen Inject 14 Units into the skin 3 (three) times daily with meals.   insulin glargine (LANTUS SOLOSTAR) 100 UNIT/ML Solostar Pen Inject 55 Units into the skin daily. Inject 0.16 mLs (16 Units total) into the skin daily.   isosorbide mononitrate (IMDUR) 30 MG 24 hr tablet Take 1 tablet (30 mg total) by mouth daily.   Melatonin 5 MG TABS Take 5 mg by mouth at bedtime.   montelukast (SINGULAIR) 10 MG tablet Take 10 mg by mouth daily.   nitroGLYCERIN (NITROSTAT) 0.4 MG SL tablet Place 1 tablet (0.4 mg total) under the tongue every 5 (five) minutes as needed for chest pain.   oxybutynin (DITROPAN) 5 MG tablet Take 5 mg by mouth 2 (two) times daily.   polyethylene glycol (MIRALAX / GLYCOLAX) 17 g  packet Take 17 g by mouth at bedtime.   senna-docusate (SENOKOT-S) 8.6-50 MG tablet Take 2 tablets by mouth 2 (two) times daily.   TRUEPLUS INSULIN SYRINGE 31G X 5/16" 0.3 ML MISC USE THREE TIMES DAILY AS NEEDED   No facility-administered encounter medications on file as of 07/18/2021.     Thank you for the opportunity to participate in the care of Mr. Grewell.  The palliative care team will continue to follow. Please call our office at [phone] if we can be of additional assistance.   Note: Portions of this note were generated with [employer]. Dictation errors may occur despite best attempts at proofreading.  Teodoro Spray, NP

## 2021-07-19 ENCOUNTER — Inpatient Hospital Stay: Payer: HMO | Attending: Hematology and Oncology

## 2021-07-19 ENCOUNTER — Inpatient Hospital Stay: Payer: HMO

## 2021-07-19 VITALS — BP 123/50 | HR 78 | Temp 98.1°F | Resp 16

## 2021-07-19 DIAGNOSIS — Z79899 Other long term (current) drug therapy: Secondary | ICD-10-CM | POA: Diagnosis not present

## 2021-07-19 DIAGNOSIS — E611 Iron deficiency: Secondary | ICD-10-CM

## 2021-07-19 DIAGNOSIS — D631 Anemia in chronic kidney disease: Secondary | ICD-10-CM

## 2021-07-19 DIAGNOSIS — D460 Refractory anemia without ring sideroblasts, so stated: Secondary | ICD-10-CM | POA: Diagnosis not present

## 2021-07-19 DIAGNOSIS — D649 Anemia, unspecified: Secondary | ICD-10-CM

## 2021-07-19 LAB — CBC WITH DIFFERENTIAL (CANCER CENTER ONLY)
Abs Immature Granulocytes: 0.22 10*3/uL — ABNORMAL HIGH (ref 0.00–0.07)
Basophils Absolute: 0.1 10*3/uL (ref 0.0–0.1)
Basophils Relative: 1 %
Eosinophils Absolute: 1.6 10*3/uL — ABNORMAL HIGH (ref 0.0–0.5)
Eosinophils Relative: 9 %
HCT: 25.6 % — ABNORMAL LOW (ref 39.0–52.0)
Hemoglobin: 7.7 g/dL — ABNORMAL LOW (ref 13.0–17.0)
Immature Granulocytes: 1 %
Lymphocytes Relative: 5 %
Lymphs Abs: 0.9 10*3/uL (ref 0.7–4.0)
MCH: 25.5 pg — ABNORMAL LOW (ref 26.0–34.0)
MCHC: 30.1 g/dL (ref 30.0–36.0)
MCV: 84.8 fL (ref 80.0–100.0)
Monocytes Absolute: 1.7 10*3/uL — ABNORMAL HIGH (ref 0.1–1.0)
Monocytes Relative: 10 %
Neutro Abs: 13 10*3/uL — ABNORMAL HIGH (ref 1.7–7.7)
Neutrophils Relative %: 74 %
Platelet Count: 573 10*3/uL — ABNORMAL HIGH (ref 150–400)
RBC: 3.02 MIL/uL — ABNORMAL LOW (ref 4.22–5.81)
RDW: 16.9 % — ABNORMAL HIGH (ref 11.5–15.5)
WBC Count: 17.5 10*3/uL — ABNORMAL HIGH (ref 4.0–10.5)
nRBC: 0 % (ref 0.0–0.2)

## 2021-07-19 LAB — COMPREHENSIVE METABOLIC PANEL
ALT: 50 U/L — ABNORMAL HIGH (ref 0–44)
AST: 40 U/L (ref 15–41)
Albumin: 2.6 g/dL — ABNORMAL LOW (ref 3.5–5.0)
Alkaline Phosphatase: 97 U/L (ref 38–126)
Anion gap: 7 (ref 5–15)
BUN: 70 mg/dL — ABNORMAL HIGH (ref 8–23)
CO2: 25 mmol/L (ref 22–32)
Calcium: 8.1 mg/dL — ABNORMAL LOW (ref 8.9–10.3)
Chloride: 109 mmol/L (ref 98–111)
Creatinine, Ser: 2.51 mg/dL — ABNORMAL HIGH (ref 0.61–1.24)
GFR, Estimated: 24 mL/min — ABNORMAL LOW (ref >60)
Glucose, Bld: 163 mg/dL — ABNORMAL HIGH (ref 70–99)
Potassium: 4.3 mmol/L (ref 3.5–5.1)
Sodium: 141 mmol/L (ref 135–145)
Total Bilirubin: 0.2 mg/dL — ABNORMAL LOW (ref 0.3–1.2)
Total Protein: 6.5 g/dL (ref 6.5–8.1)

## 2021-07-19 MED ORDER — DARBEPOETIN ALFA 100 MCG/0.5ML IJ SOSY
100.0000 ug | PREFILLED_SYRINGE | Freq: Once | INTRAMUSCULAR | Status: AC
  Administered 2021-07-19: 100 ug via SUBCUTANEOUS
  Filled 2021-07-19: qty 0.5

## 2021-07-21 ENCOUNTER — Other Ambulatory Visit (HOSPITAL_COMMUNITY): Payer: Self-pay

## 2021-07-21 DIAGNOSIS — R1310 Dysphagia, unspecified: Secondary | ICD-10-CM

## 2021-07-26 ENCOUNTER — Encounter (HOSPITAL_COMMUNITY): Payer: Self-pay

## 2021-07-26 ENCOUNTER — Encounter (HOSPITAL_COMMUNITY): Payer: HMO

## 2021-07-26 ENCOUNTER — Ambulatory Visit (HOSPITAL_COMMUNITY): Payer: HMO

## 2021-08-04 ENCOUNTER — Telehealth: Payer: Self-pay | Admitting: *Deleted

## 2021-08-07 ENCOUNTER — Encounter (HOSPITAL_COMMUNITY): Payer: HMO

## 2021-08-07 ENCOUNTER — Encounter (HOSPITAL_COMMUNITY): Payer: Self-pay

## 2021-08-07 ENCOUNTER — Encounter: Payer: Self-pay | Admitting: Hematology and Oncology

## 2021-08-07 ENCOUNTER — Ambulatory Visit (HOSPITAL_COMMUNITY): Payer: HMO

## 2021-08-07 NOTE — Telephone Encounter (Signed)
This RN received a call from the pt's daughter,Lisa , stating " my father passed away earlier today ". ( Per chart the pt has been under palliative care )  She states " he seemed to be doing well and then this morning ( Aug 28, 2021) he passed ".  This RN gave condolences as well as per daughter will cancel appts for this office.  This RN called Palliative to confirm death - transferred to VM- message left per need of confirmation with this RN's name and return call number for contact.

## 2021-08-16 ENCOUNTER — Inpatient Hospital Stay: Payer: HMO

## 2021-08-16 DEATH — deceased

## 2021-09-12 ENCOUNTER — Ambulatory Visit: Payer: HMO

## 2021-09-12 ENCOUNTER — Other Ambulatory Visit: Payer: HMO

## 2021-09-12 ENCOUNTER — Ambulatory Visit: Payer: HMO | Admitting: Hematology and Oncology

## 2021-09-26 ENCOUNTER — Ambulatory Visit: Payer: HMO | Admitting: Endocrinology

## 2021-10-11 ENCOUNTER — Ambulatory Visit: Payer: HMO

## 2021-10-11 ENCOUNTER — Other Ambulatory Visit: Payer: HMO

## 2022-08-25 IMAGING — CT CT ABD-PELV W/O CM
2 of 4 series · 14 of 46 positions shown, 16 images · non-contrast
Comparison: Portable chest today and 05/15/2021, pelvic CT no
contrast 03/28/2021. CT chest, abdomen and pelvis no contrast
08/15/2020

CLINICAL DATA: Recently increasing cough and abdominal pain. Not
eating according to daughter.



[Series 2: axial st · axial · 0.98mm/px · z∈[-414,+26]mm · 11 of 102 slices shown, 13 images]
[im 7/102  soft-tissue]
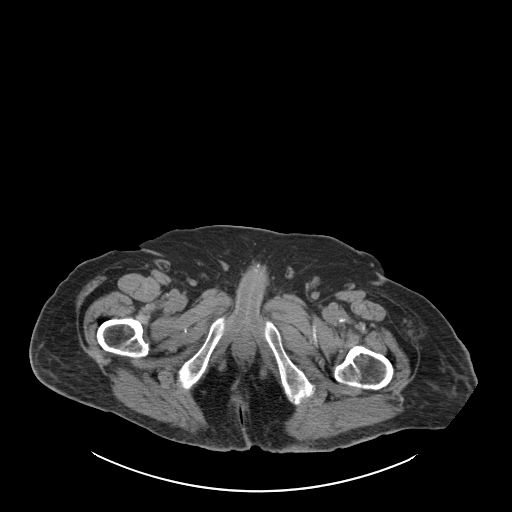
[im 7/102  bone]
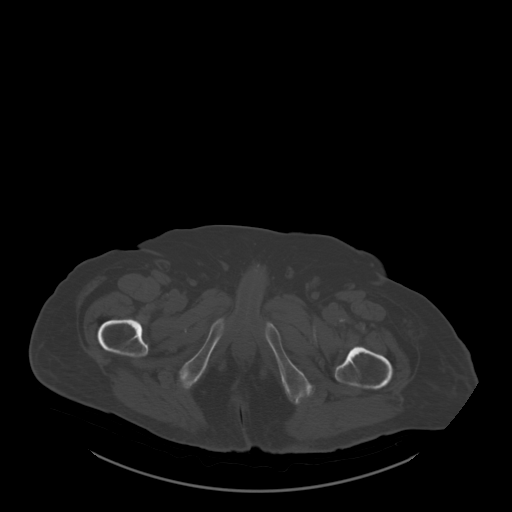
[im 19/102  soft-tissue]
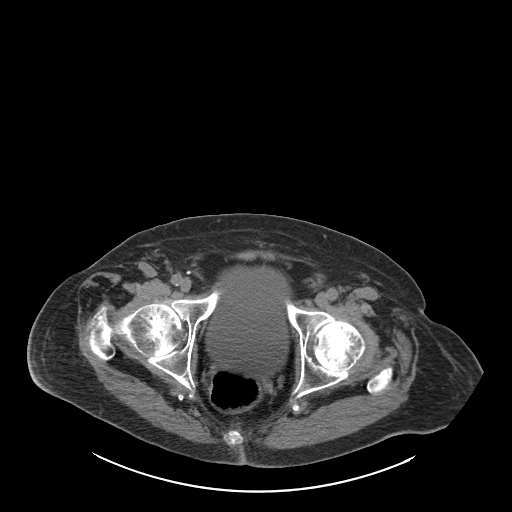
[im 26/102  soft-tissue]
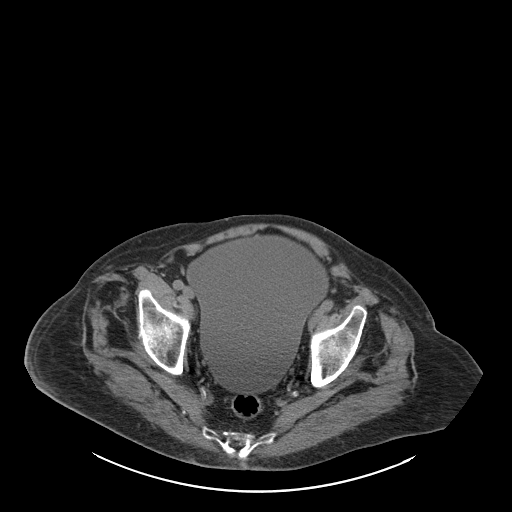
[im 32/102  soft-tissue]
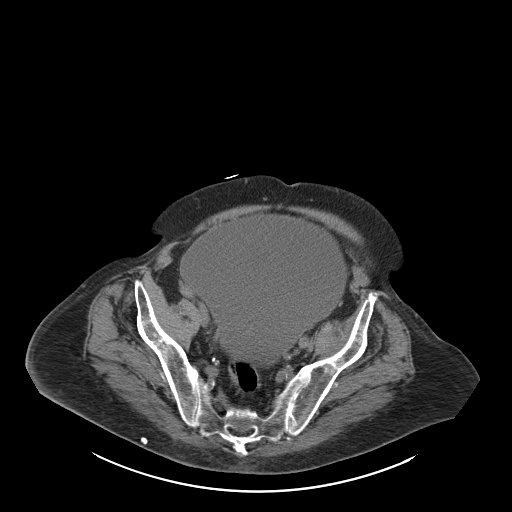
[im 45/102  soft-tissue]
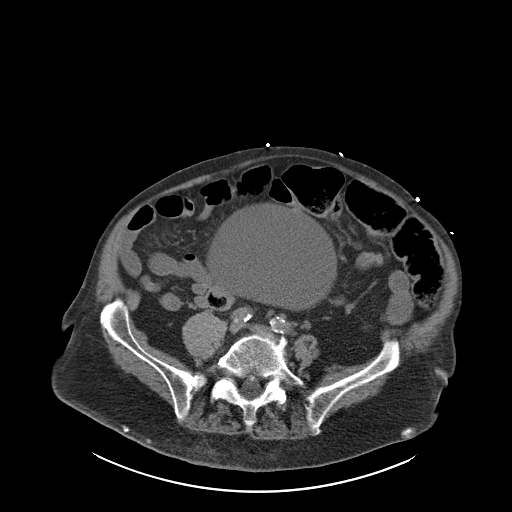
[im 51/102  soft-tissue]
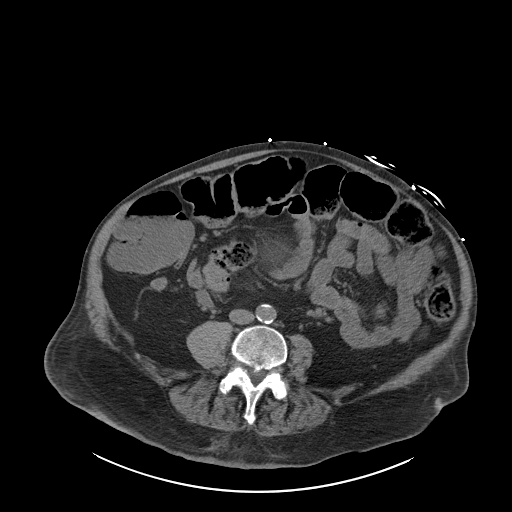
[im 57/102  soft-tissue]
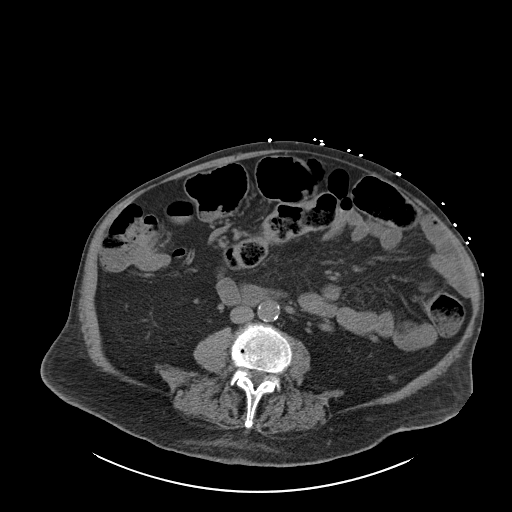
[im 70/102  soft-tissue]
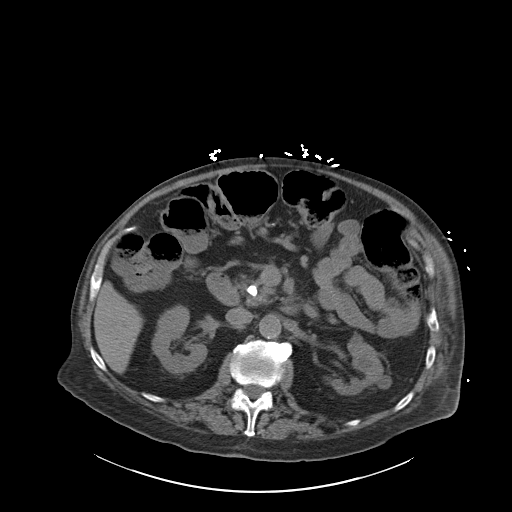
[im 76/102  soft-tissue]
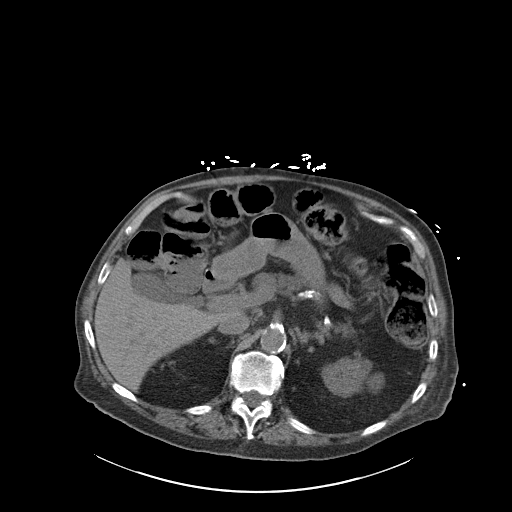
[im 76/102  bone]
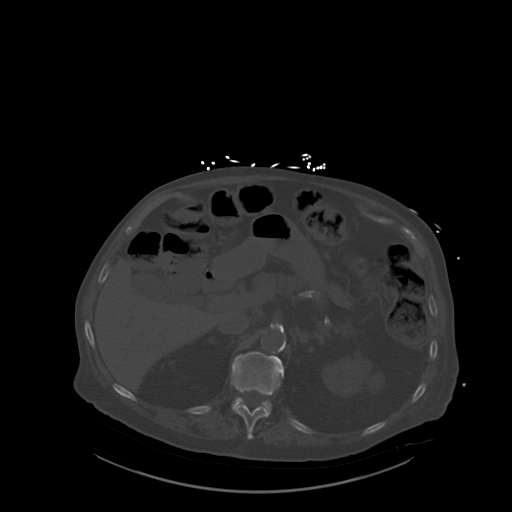
[im 83/102  soft-tissue]
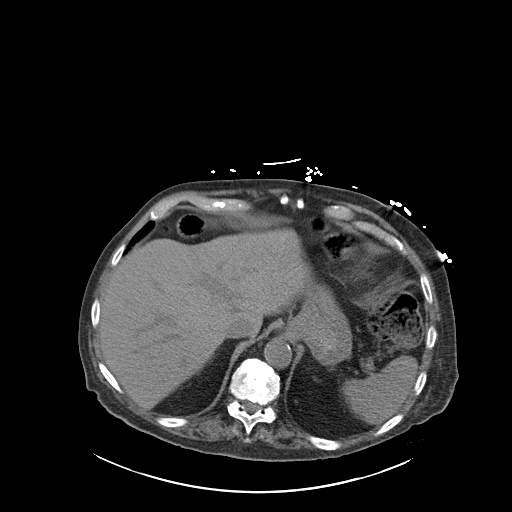
[im 95/102  soft-tissue]
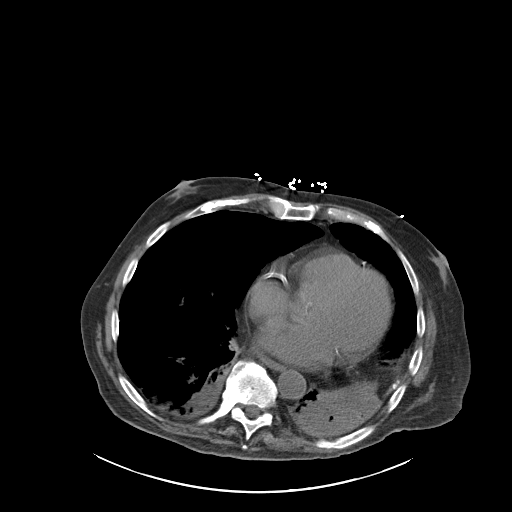

[Series 5: coronal st · coronal · 0.88mm/px · 3 of 179 slices shown]
[im 60/179  soft-tissue]
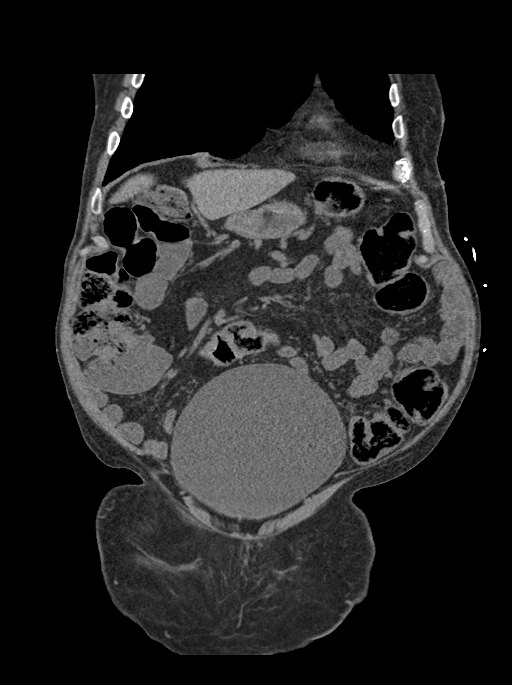
[im 80/179  soft-tissue]
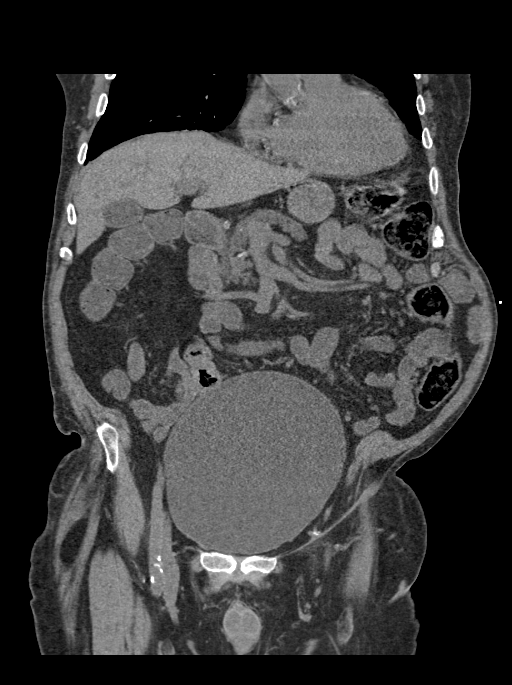
[im 99/179  soft-tissue]
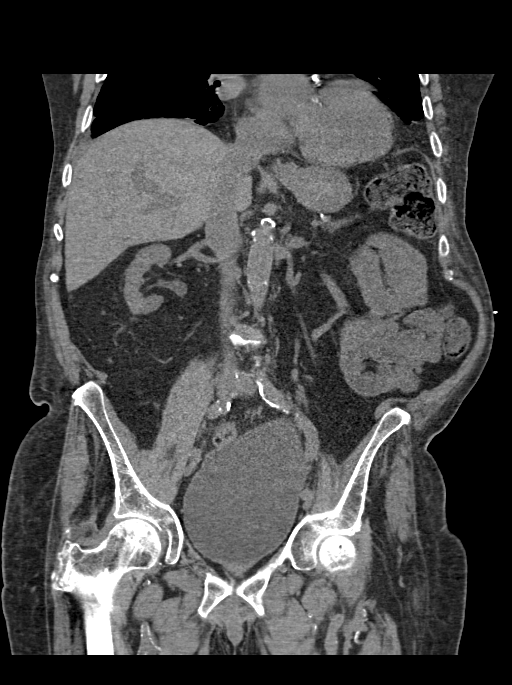

[14 of 46 positions shown; findings below may reference images not displayed]

FINDINGS: Lower chest: There is increasingly dense consolidation in the right
greater than left lower lobes. There is less extensive consolidation
previously. Findings may suggest acute on chronic aspiration.
Background bronchiectasis in lower lobes with scattered bronchial
impaction, tree-in-bud interstitial changes above the levels of
dense consolidation

Remaining lung bases are clear with COPD change. There is
three-vessel calcific CAD, normal cardiac size with low-density of
the intracardiac blood pool consistent with anemia. Trace pleural
effusions.

Hepatobiliary: No focal liver abnormality is seen. No gallstones,
gallbladder wall thickening, or biliary dilatation.

Pancreas: There is a 1 cm calcification in the head of the pancreas.
The previous study demonstrated a 3 mm calcification in this
location. There is prominence of the proximal pancreatic duct but
the mid to distal pancreatic duct is not dilated. There is no
adjacent inflammatory change.

Spleen: Unremarkable without contrast.

Adrenals/Urinary Tract: There is no adrenal mass. There are small
renal cysts. No suspicious abnormality of unenhanced renal cortex.
There is no evidence of urinary stones or hydronephrosis. There is
moderate severe dilatation of the bladder with the dome reaching the
mid L4 level. No bladder thickening is seen.

Stomach/Bowel: Gastric wall is contracted. There is no small bowel
obstruction or inflammation. Normal appendix. There are fluid levels
in the ascending colon, moderate stool retention transverse and
descending segment. Scattered sigmoid diverticula without evidence
of diverticulitis.

Vascular/Lymphatic: Aortic atherosclerosis. No enlarged abdominal or
pelvic lymph nodes.

Reproductive: The prostate is not enlarged.

Other: Small umbilical and inguinal fat hernias. No free air,
hemorrhage or fluid.

Musculoskeletal: Osteopenia and degenerative change thoracolumbar
spine, multilevel bridging thoracic spine enthesopathy. Grade 1
degenerative L4-5 anterolisthesis with advanced facet hypertrophy.
Ankylosis both SI joints.
IMPRESSION: 1. Worsening left-greater-than-right lower lobe consolidation,
consider acute on chronic aspiration. Background bronchiectasis with
scattered mucoid impaction. Anterior lung bases are clear.
2. Constipation, without evidence of small-bowel obstruction. Fluid
in the ascending colon.
3. Moderate to severe dilatation of the bladder. No appreciable
enlargement of the prostate. Correlate clinically for outlet
obstruction. No focal bladder wall thickening.
4. Aortic atherosclerosis.
5. 1 cm calcification in the pancreatic head which is in the
location of a previous 3 mm calcification. There is increased
prominence of the proximal pancreatic duct. Findings could indicate
an enlarging proximal pancreatic ductal stone. No biliary dilatation
is seen. Consider MRCP follow-up.
6. Osteopenia and degenerative change.
